# Patient Record
Sex: Female | Born: 1945 | Race: White | Hispanic: No | Smoking: Never smoker
Health system: Southern US, Community
[De-identification: ages and names within clinical notes are randomized; demographics above are authoritative.]

## PROBLEM LIST (undated history)

## (undated) DIAGNOSIS — M797 Fibromyalgia: Secondary | ICD-10-CM

## (undated) DIAGNOSIS — F419 Anxiety disorder, unspecified: Secondary | ICD-10-CM

## (undated) DIAGNOSIS — I639 Cerebral infarction, unspecified: Secondary | ICD-10-CM

## (undated) DIAGNOSIS — K219 Gastro-esophageal reflux disease without esophagitis: Secondary | ICD-10-CM

## (undated) DIAGNOSIS — G629 Polyneuropathy, unspecified: Secondary | ICD-10-CM

## (undated) DIAGNOSIS — I10 Essential (primary) hypertension: Secondary | ICD-10-CM

## (undated) DIAGNOSIS — M069 Rheumatoid arthritis, unspecified: Secondary | ICD-10-CM

## (undated) DIAGNOSIS — F329 Major depressive disorder, single episode, unspecified: Secondary | ICD-10-CM

## (undated) DIAGNOSIS — F32A Depression, unspecified: Secondary | ICD-10-CM

## (undated) HISTORY — PX: ABDOMINAL HYSTERECTOMY: SHX81

## (undated) HISTORY — PX: KNEE SURGERY: SHX244

## (undated) HISTORY — PX: CHOLECYSTECTOMY: SHX55

## (undated) HISTORY — PX: ANKLE RECONSTRUCTION: SHX1151

## (undated) HISTORY — PX: APPENDECTOMY: SHX54

## (undated) HISTORY — PX: BACK SURGERY: SHX140

---

## 1998-01-09 ENCOUNTER — Other Ambulatory Visit: Admission: RE | Admit: 1998-01-09 | Discharge: 1998-01-09 | Payer: Self-pay | Admitting: Obstetrics and Gynecology

## 1998-08-30 ENCOUNTER — Inpatient Hospital Stay (HOSPITAL_COMMUNITY): Admission: EM | Admit: 1998-08-30 | Discharge: 1998-09-02 | Payer: Self-pay | Admitting: Orthopedic Surgery

## 1999-04-04 ENCOUNTER — Encounter: Payer: Self-pay | Admitting: Physical Medicine and Rehabilitation

## 1999-04-04 ENCOUNTER — Encounter
Admission: RE | Admit: 1999-04-04 | Discharge: 1999-04-04 | Payer: Self-pay | Admitting: Physical Medicine and Rehabilitation

## 1999-12-18 ENCOUNTER — Other Ambulatory Visit: Admission: RE | Admit: 1999-12-18 | Discharge: 1999-12-18 | Payer: Self-pay | Admitting: Obstetrics and Gynecology

## 2000-01-10 ENCOUNTER — Encounter: Payer: Self-pay | Admitting: Neurological Surgery

## 2000-01-10 ENCOUNTER — Encounter: Admission: RE | Admit: 2000-01-10 | Discharge: 2000-01-10 | Payer: Self-pay | Admitting: Neurological Surgery

## 2000-04-02 ENCOUNTER — Encounter: Payer: Self-pay | Admitting: Neurological Surgery

## 2000-04-06 ENCOUNTER — Encounter: Payer: Self-pay | Admitting: Neurological Surgery

## 2000-04-06 ENCOUNTER — Ambulatory Visit (HOSPITAL_COMMUNITY): Admission: RE | Admit: 2000-04-06 | Discharge: 2000-04-08 | Payer: Self-pay | Admitting: Neurological Surgery

## 2000-12-02 ENCOUNTER — Encounter: Admission: RE | Admit: 2000-12-02 | Discharge: 2000-12-02 | Payer: Self-pay | Admitting: Neurological Surgery

## 2000-12-02 ENCOUNTER — Encounter: Payer: Self-pay | Admitting: Neurological Surgery

## 2001-03-02 ENCOUNTER — Encounter (HOSPITAL_COMMUNITY): Admission: RE | Admit: 2001-03-02 | Discharge: 2001-04-01 | Payer: Self-pay | Admitting: Neurological Surgery

## 2001-04-07 ENCOUNTER — Encounter (HOSPITAL_COMMUNITY): Admission: RE | Admit: 2001-04-07 | Discharge: 2001-05-07 | Payer: Self-pay | Admitting: Neurological Surgery

## 2001-05-18 ENCOUNTER — Other Ambulatory Visit: Admission: RE | Admit: 2001-05-18 | Discharge: 2001-05-18 | Payer: Self-pay | Admitting: Otolaryngology

## 2002-11-29 ENCOUNTER — Encounter: Payer: Self-pay | Admitting: Neurological Surgery

## 2002-12-01 ENCOUNTER — Observation Stay (HOSPITAL_COMMUNITY): Admission: RE | Admit: 2002-12-01 | Discharge: 2002-12-03 | Payer: Self-pay | Admitting: Neurological Surgery

## 2002-12-01 ENCOUNTER — Encounter: Payer: Self-pay | Admitting: Neurological Surgery

## 2003-02-21 ENCOUNTER — Inpatient Hospital Stay (HOSPITAL_COMMUNITY): Admission: RE | Admit: 2003-02-21 | Discharge: 2003-02-23 | Payer: Self-pay | Admitting: Neurological Surgery

## 2003-02-21 ENCOUNTER — Encounter: Payer: Self-pay | Admitting: Neurological Surgery

## 2003-06-13 ENCOUNTER — Ambulatory Visit (HOSPITAL_COMMUNITY): Admission: RE | Admit: 2003-06-13 | Discharge: 2003-06-13 | Payer: Self-pay | Admitting: Family Medicine

## 2003-07-19 ENCOUNTER — Ambulatory Visit (HOSPITAL_COMMUNITY): Admission: RE | Admit: 2003-07-19 | Discharge: 2003-07-19 | Payer: Self-pay | Admitting: Neurological Surgery

## 2004-03-19 ENCOUNTER — Other Ambulatory Visit: Admission: RE | Admit: 2004-03-19 | Discharge: 2004-03-19 | Payer: Self-pay | Admitting: Obstetrics and Gynecology

## 2004-08-14 ENCOUNTER — Ambulatory Visit (HOSPITAL_COMMUNITY): Admission: RE | Admit: 2004-08-14 | Discharge: 2004-08-14 | Payer: Self-pay | Admitting: Neurological Surgery

## 2004-10-29 ENCOUNTER — Encounter: Admission: RE | Admit: 2004-10-29 | Discharge: 2004-10-29 | Payer: Self-pay | Admitting: Rheumatology

## 2005-01-14 ENCOUNTER — Encounter: Admission: RE | Admit: 2005-01-14 | Discharge: 2005-01-14 | Payer: Self-pay | Admitting: Rheumatology

## 2005-05-13 ENCOUNTER — Encounter: Admission: RE | Admit: 2005-05-13 | Discharge: 2005-05-13 | Payer: Self-pay | Admitting: Neurological Surgery

## 2005-08-05 ENCOUNTER — Encounter: Admission: RE | Admit: 2005-08-05 | Discharge: 2005-08-05 | Payer: Self-pay | Admitting: Orthopedic Surgery

## 2005-11-18 ENCOUNTER — Encounter: Payer: Self-pay | Admitting: Vascular Surgery

## 2005-11-18 ENCOUNTER — Ambulatory Visit (HOSPITAL_COMMUNITY): Admission: RE | Admit: 2005-11-18 | Discharge: 2005-11-18 | Payer: Self-pay | Admitting: Orthopedic Surgery

## 2006-01-27 ENCOUNTER — Encounter: Admission: RE | Admit: 2006-01-27 | Discharge: 2006-01-27 | Payer: Self-pay | Admitting: Orthopedic Surgery

## 2006-06-19 ENCOUNTER — Encounter: Admission: RE | Admit: 2006-06-19 | Discharge: 2006-06-19 | Payer: Self-pay | Admitting: Neurological Surgery

## 2006-08-22 ENCOUNTER — Encounter: Admission: RE | Admit: 2006-08-22 | Discharge: 2006-08-22 | Payer: Self-pay | Admitting: Orthopedic Surgery

## 2006-11-16 ENCOUNTER — Ambulatory Visit (HOSPITAL_COMMUNITY): Admission: RE | Admit: 2006-11-16 | Discharge: 2006-11-16 | Payer: Self-pay | Admitting: Family Medicine

## 2007-01-19 ENCOUNTER — Ambulatory Visit (HOSPITAL_COMMUNITY): Admission: RE | Admit: 2007-01-19 | Discharge: 2007-01-19 | Payer: Self-pay | Admitting: Family Medicine

## 2007-04-04 ENCOUNTER — Encounter: Admission: RE | Admit: 2007-04-04 | Discharge: 2007-04-04 | Payer: Self-pay | Admitting: Neurological Surgery

## 2007-07-20 ENCOUNTER — Encounter: Admission: RE | Admit: 2007-07-20 | Discharge: 2007-07-20 | Payer: Self-pay | Admitting: Orthopedic Surgery

## 2007-09-23 ENCOUNTER — Ambulatory Visit (HOSPITAL_COMMUNITY): Admission: RE | Admit: 2007-09-23 | Discharge: 2007-09-23 | Payer: Self-pay | Admitting: Family Medicine

## 2007-11-09 ENCOUNTER — Ambulatory Visit (HOSPITAL_COMMUNITY): Admission: RE | Admit: 2007-11-09 | Discharge: 2007-11-09 | Payer: Self-pay | Admitting: Family Medicine

## 2007-11-29 ENCOUNTER — Encounter: Admission: RE | Admit: 2007-11-29 | Discharge: 2007-11-29 | Payer: Self-pay | Admitting: Orthopedic Surgery

## 2007-12-30 ENCOUNTER — Encounter: Admission: RE | Admit: 2007-12-30 | Discharge: 2007-12-30 | Payer: Self-pay | Admitting: Orthopedic Surgery

## 2008-02-07 ENCOUNTER — Encounter: Admission: RE | Admit: 2008-02-07 | Discharge: 2008-02-07 | Payer: Self-pay | Admitting: Orthopedic Surgery

## 2008-02-24 ENCOUNTER — Encounter: Admission: RE | Admit: 2008-02-24 | Discharge: 2008-02-24 | Payer: Self-pay | Admitting: Orthopedic Surgery

## 2008-05-25 ENCOUNTER — Encounter: Admission: RE | Admit: 2008-05-25 | Discharge: 2008-05-25 | Payer: Self-pay | Admitting: Rheumatology

## 2008-12-02 ENCOUNTER — Encounter: Admission: RE | Admit: 2008-12-02 | Discharge: 2008-12-02 | Payer: Self-pay | Admitting: Neurological Surgery

## 2009-01-06 ENCOUNTER — Encounter: Admission: RE | Admit: 2009-01-06 | Discharge: 2009-01-06 | Payer: Self-pay | Admitting: Orthopedic Surgery

## 2009-06-15 ENCOUNTER — Encounter: Admission: RE | Admit: 2009-06-15 | Discharge: 2009-06-15 | Payer: Self-pay | Admitting: Neurological Surgery

## 2009-07-08 ENCOUNTER — Encounter: Admission: RE | Admit: 2009-07-08 | Discharge: 2009-07-08 | Payer: Self-pay | Admitting: Orthopedic Surgery

## 2010-06-29 ENCOUNTER — Encounter: Payer: Self-pay | Admitting: Neurological Surgery

## 2010-06-30 ENCOUNTER — Encounter: Payer: Self-pay | Admitting: Family Medicine

## 2010-06-30 ENCOUNTER — Encounter: Payer: Self-pay | Admitting: Neurological Surgery

## 2010-06-30 ENCOUNTER — Encounter: Payer: Self-pay | Admitting: Rheumatology

## 2010-08-27 ENCOUNTER — Other Ambulatory Visit: Payer: Self-pay | Admitting: Rheumatology

## 2010-08-27 ENCOUNTER — Ambulatory Visit
Admission: RE | Admit: 2010-08-27 | Discharge: 2010-08-27 | Disposition: A | Discharge status: Home / Self Care | Payer: Medicare Other | Source: Ambulatory Visit | Attending: Rheumatology | Admitting: Rheumatology

## 2010-08-27 DIAGNOSIS — M1990 Unspecified osteoarthritis, unspecified site: Secondary | ICD-10-CM

## 2010-10-25 NOTE — Op Note (Signed)
[place]  Patient:    Robin Blackburn, Robin Blackburn                        MRN: [id number] Proc. Date: 04/06/00 Adm. Date:  [id number] Attending:  Jonne Ply                           Operative Report  PREOPERATIVE DIAGNOSIS:  Spondylosis L4-5, with bilateral L5 radiculopathy.  POSTOPERATIVE DIAGNOSIS:  Spondylosis L4-5, with bilateral L5 radiculopathy.  OPERATION:  L4 and L5 laminotomy and foraminotomy, with micro endoscopic dissection for decompression of the L5 nerve root -- using the Met-Rx system and operating microscope with microdissection technique.  SURGEON:  Stefani Dama, M.D.  FIRST ASSISTANT:  Tanya Nones. Jeral Fruit, M.D.  ANESTHESIA:  General endotracheal.  INDICATIONS:  The patient is a 65 year old individual who has had significant back and bilateral lumbar radiculopathy (worse on the left than on the right).  She was found to have severe spondylosis at the L4-5 level, with bilateral recess stenosis.  After careful consideration of her options, she was advised regarding surgical decompression and foraminotomy.  She is now taken to the operating room for these procedures.  DESCRIPTION OF PROCEDURE:  The patient was brought to the operating room supine on the stretcher.  After the smooth induction of general endotracheal anesthesia, she was turned prone.  The back was shaved and prepped with Duraprep and draped in a sterile fashion.  After localizing the area of the back using the fluoroscope in the AP direction, the lateral position was checked in an area of skin measuring 3.0 inches in length with a total of 2.5 cc of lidocaine with epinephrine.  A midline incision was then created over the chosen area at the L4-5 interspace.  Subcutaneous tissue was dissected with finger dissection, and a paramedian approach was chosen to place a K-wire at the L4 lamina.  The interlaminar space was then opened using a wanding technique, using a  series of dilators up to the 18 mm space.  Then the 18 mm x 5 cm trocar was placed as an endoscope through the incision in the back, down to the interlaminar space at L4-5.  Dissection was then further completed using a combination of curets and a monopolar cautery.  The inferior margin of the lamina at L4 was resected and a laminotomy was created in this region.  The common dura tube was identified. The takeoff of the L5 nerve root was identified.  This area was then cleared out gradually by using a combination of Midas Rex with the A2 dissector, and the 2 and 3 mm Kerrison punch.  Under microscopic magnification with microdissection technique, we decompressed the common dura to L5 nerve root.  The disk space was examined. This was noted to have a small ventral lump but was very solid, no free disk material was noted and the longitudinal ligament was noted to be solid.  A Fenmore procedure was then completed on the contralateral side using the same fluoroscopic approach through the same central incision.  Once the decompression was completed in a similar fashion and care was taken to assure that the nerve root was free and clear, hemostasis from the soft tissues was obtained.   The endoscope was removed.  The microscope was removed from the field.  Vicryl 3-0 suture was used in the subcuticular tissues, after closing the fascia with 2-0 Vicryl  in interrupted fashion.  The patient tolerated the procedure well and went to recovery room in stable condition. DD:  04/06/00 TD:  04/07/00 Job: [id number]

## 2010-10-25 NOTE — Op Note (Signed)
NAME:  Robin Blackburn, Robin Blackburn                           ACCOUNT NO.:  [id number]   MEDICAL RECORD NO.:  [id number]                   PATIENT TYPE:  INP   LOCATION:  3172                                 FACILITY:  [facility]   PHYSICIAN:  Stefani Dama, M.D.               DATE OF BIRTH:  [birth year]   DATE OF PROCEDURE:  02/21/2003  DATE OF DISCHARGE:                                 OPERATIVE REPORT   PREOPERATIVE DIAGNOSIS:  Recurrent herniated nucleus pulposus L5-S1 left.   POSTOPERATIVE DIAGNOSIS:  Recurrent herniated nucleus pulposus L5-S1 left.   OPERATION:  L5-S1 laminotomy and microdiskectomy with operating microscope  and microdissection technique L5-S1 left.   SURGEON:  Stefani Dama, M.D.   ANESTHESIA:  General endotracheal.   INDICATIONS FOR PROCEDURE:  The patient is a 66 year old individual who has  had significant back and left lower extremity pain.  He has actually had  bilateral lower extremity pain and underwent a lumbar MedTREK diskectomy at  L5-S1 on the left side in June 2004.  She was not getting better from the  immediate time after the surgery, and had persistent complaints of  significant back pain, bilateral leg pain and numbness.  A recent MRI  demonstrated that the patient has evidence of significant spondylotic  disease in the lower lumbar spine and a recurrent disk herniation at L50-S1  on the left side.  She is taken back to the operating room now to undergo a  surgical exploration and extirpation of the disk via a microscopic  diskectomy.   DESCRIPTION OF PROCEDURE:  The patient was brought to the operating room  supine on the stretcher.  After a smooth induction of general endotracheal  anesthesia, she was turned prone and the back was shaved and prepped with  DuraPrep and draped in a sterile fashion.  The previously-made incision was  opened and enlarged inferiorly.  The dissection was carried down to the  lumbodorsal fascia which was opened on the left  side.  Then using a  subperiosteal dissection and a Cobb elevator, the L5-S1 space was uncovered.  The fascial and scar tissue had grown up in this region.  It was then  trimmed down.  The intralaminar space was identified.  The small laminotomy  was enlarged significantly to remove the most of the entire laminar arch of  L5 on the left side, to the mesial wall of the facet.  Scar was then divided  and the common dural tube was identified and carefully dissected.  The S1  nerve root was noted to be involved in some scar tissue and this was gently  dissected.  Underneath this was noted to be a significant mass and there was  some free fragments of disk material in this area.  These were incised and  then removed.  Then using a combination of microdissectors and the operating  microscope for better visualization, the  dissection along the S1 nerve root  and the common dural tube was undertaken to remove all the scar tissue and  reflect the common dural tube and the S1 nerve root medially.  When this was  accomplished, then the disk space was incised further, and significant other  quantities of disk material were removed from the subligamentous space and  in the free just medial to the S1 nerve root.  This allowed for a good  decompression of the common dural tube and the S1 nerve root.  This was  accomplished very thoroughly both medially to the disk and inferiorly along  the superior edge of the S1 vertebra.  When the entire area was decompressed  well, hemostasis in the epidural spaces was checked cautiously.  The S1  nerve root was able to be sounded down the foramen.  The common dural tube  was decompressed medially, and then 25 mcg of fentanyl was left in the  epidural space, and the retractors were removed.  The microscope was  removed.  The lumbodorsal fascia was closed with #1 Vicryl in an  interrupted fashion, and #2-0 Vicryl was used in the subcutaneous and  subcuticular tissues,  and #3-0 Vicryl subcuticularly.  DermaBond was placed  in the skin.  The patient tolerated the procedure well and was returned to the recovery  room in stable condition.                                                Stefani Dama, M.D.    [name]  D:  02/21/2003  T:  02/21/2003  Job:  [id number]

## 2010-10-25 NOTE — Discharge Summary (Signed)
   NAME:  Robin Blackburn, Robin Blackburn                           ACCOUNT NO.:  [id number]   MEDICAL RECORD NO.:  [id number]                   PATIENT TYPE:  INP   LOCATION:  3028                                 FACILITY:  [facility]   PHYSICIAN:  Stefani Dama, M.D.               DATE OF BIRTH:  1946/03/16   DATE OF ADMISSION:  02/21/2003  DATE OF DISCHARGE:  02/23/2003                                 DISCHARGE SUMMARY   CONDITION ON DISCHARGE:  Stable.   HOSPITAL COURSE:  Robin Blackburn is a 65 year old individual who has had  problems with a herniated nucleus pulposus at L5-S1 on the left side. She  had initial surgery in June but then soon complained of the same severe pain  in the buttock and left lower extremity and was found to have a large  recurrent disk herniation on a recent scan a few weeks ago. She was advised  regarding repeat surgery, and this was performed on September 14.  Postoperatively, she has had good initial relief from her left buttock and  leg pain. She has complained of nausea during the postoperative period. She  was treated with Zofran intravenously and Phenergan IV and orally.  Clinically, she is having some return of left leg pain but overall seems to  be functioning fairly well. She is discharged home with a prescription for  Darvocet as needed for pain, Phenergan for nausea, Robaxin as a muscle  relaxer, and Mobic as a nonsteroidal anti-inflammatory which the patient  notes she can take for brief periods of time before it tends to make her  stomach ill. She will be seen in the office in two weeks' time for further  followup.   CONDITION ON DISCHARGE:  Stable.   FINAL DIAGNOSIS:  Same.                                                Stefani Dama, M.D.    [name]  D:  02/23/2003  T:  02/25/2003  Job:  [id number]

## 2010-10-25 NOTE — Op Note (Signed)
NAME:  Robin Blackburn, Robin Blackburn                           ACCOUNT NO.:  [id number]   MEDICAL RECORD NO.:  [id number]                   PATIENT TYPE:  OIB   LOCATION:  NA                                   FACILITY:  [facility]   PHYSICIAN:  Stefani Dama, M.D.               DATE OF BIRTH:  [birth year]   DATE OF PROCEDURE:  12/01/2002  DATE OF DISCHARGE:                                 OPERATIVE REPORT   PREOPERATIVE DIAGNOSIS:  Herniated nucleus pulposus, L5-S1 right, with right  lumbar radiculopathy.   POSTOPERATIVE DIAGNOSIS:  Herniated nucleus pulposus, L5-S1 right, with  right lumbar radiculopathy.   PROCEDURE:  Right L5-S1 Met-Rx diskectomy with operating microscope and  microdissection technique.   SURGEON:  Stefani Dama, M.D.   FIRST ASSISTANT:  Coletta Memos, M.D.   ANESTHESIA:  General endotracheal.   INDICATIONS:  The patient is a 65 year old individual who has had  significant back and right lower extremity pain.  She has evidence of a  herniated nucleus pulposus on the left side at L5-S1.  Because of her left-  legged pain, she is being taken to the operating room.   PROCEDURE:  The patient was brought to the operating room supine on the  stretcher.  After the smooth induction of general endotracheal anesthesia,  she was turned prone and the back was shaved, prepped with Duraprep, and  draped in a sterile fashion.  Using fluoroscopic localization and a latex-  free technique, the patient had the L5-S1 area localized with a K-wire.  Then a wanding technique was used after a one-inch incision was created in  the lower lumbar spine to dissect away the tissues in the area of the  interlaminar space at L5-S1 on the left side.  Ultimately an 18 mm x 6 cm  long endoscopic cannula was fixed to the operating table with a clamp.  The  microscope was draped and brought into the field and then through this  aperture, a laminotomy was created removing the inferior margin of the  lamina  of L5 out to the mesial wall of the facet.  The yellow ligament was  taken up and the common dural tube was then identified.  With further  dissection the disk space was identified with a significant subligamentous  mass protruding into the S1 nerve root on that left side.  By dissecting  further and isolating the S1 nerve root and the common dural tube medially,  epidural veins were cauterized and divided laterally using a microdissection  technique.  The procedure was performed with Dr. Franky Macho helping to retract  the common dural tube and the nerve root to provide exposure and the mass  was incised, and underneath this mass were found to be several fragments of  disk material that was readily removed from the subligamentous space,  immediately allowing good decompression of the S1 nerve root.  The  diskectomy was then continued by entering the disk space and removing a  significant quantity of severely degenerated disk material from within the  disk space itself.  Once this was completed, the area was copiously  irrigated with antibiotic irrigating solution and hemostasis in the soft  tissues was obtained.  The space was repeatedly checked and no other  fragments of disk material could be removed from within it.  Then the  microscope  was withdrawn, the endoscopic cannula was withdrawn, and the fascia was  closed with 3-0 Vicryl in interrupted fashion, and 3-0 Vicryl was used to  close the subcuticular skin.  The patient tolerated the procedure well and  was returned to the recovery room in stable condition.                                               Stefani Dama, M.D.    [name]  D:  12/01/2002  T:  12/03/2002  Job:  [id number]

## 2010-10-28 ENCOUNTER — Other Ambulatory Visit: Payer: Self-pay | Admitting: Orthopedic Surgery

## 2010-10-28 ENCOUNTER — Ambulatory Visit
Admission: RE | Admit: 2010-10-28 | Discharge: 2010-10-28 | Disposition: A | Discharge status: Home / Self Care | Payer: Medicare Other | Source: Ambulatory Visit | Attending: Orthopedic Surgery | Admitting: Orthopedic Surgery

## 2010-10-28 DIAGNOSIS — T148XXA Other injury of unspecified body region, initial encounter: Secondary | ICD-10-CM

## 2010-12-23 ENCOUNTER — Other Ambulatory Visit: Payer: Self-pay | Admitting: Neurological Surgery

## 2010-12-23 DIAGNOSIS — M47816 Spondylosis without myelopathy or radiculopathy, lumbar region: Secondary | ICD-10-CM

## 2010-12-31 ENCOUNTER — Other Ambulatory Visit: Payer: Medicare Other

## 2011-01-15 ENCOUNTER — Other Ambulatory Visit: Payer: Medicare Other

## 2011-01-23 ENCOUNTER — Other Ambulatory Visit: Payer: Medicare Other

## 2011-01-28 ENCOUNTER — Ambulatory Visit
Admission: RE | Admit: 2011-01-28 | Discharge: 2011-01-28 | Disposition: A | Discharge status: Home / Self Care | Payer: Medicare Other | Source: Ambulatory Visit | Attending: Neurological Surgery | Admitting: Neurological Surgery

## 2011-01-28 DIAGNOSIS — M47816 Spondylosis without myelopathy or radiculopathy, lumbar region: Secondary | ICD-10-CM

## 2011-02-12 ENCOUNTER — Ambulatory Visit
Admission: RE | Admit: 2011-02-12 | Discharge: 2011-02-12 | Disposition: A | Discharge status: Home / Self Care | Payer: Medicare Other | Source: Ambulatory Visit | Attending: Neurological Surgery | Admitting: Neurological Surgery

## 2011-02-12 MED ORDER — GADOBENATE DIMEGLUMINE 529 MG/ML IV SOLN
16.0000 mL | Freq: Once | INTRAVENOUS | Status: AC | PRN
  Administered 2011-02-12: 16 mL via INTRAVENOUS

## 2011-08-29 ENCOUNTER — Other Ambulatory Visit (HOSPITAL_COMMUNITY): Payer: Self-pay | Admitting: Rheumatology

## 2011-09-01 ENCOUNTER — Other Ambulatory Visit (HOSPITAL_COMMUNITY): Payer: Medicare Other

## 2011-09-17 ENCOUNTER — Ambulatory Visit (HOSPITAL_COMMUNITY)
Admission: RE | Admit: 2011-09-17 | Discharge: 2011-09-17 | Disposition: A | Discharge status: Home / Self Care | Payer: Medicare Other | Source: Ambulatory Visit | Attending: Rheumatology | Admitting: Rheumatology

## 2011-09-17 DIAGNOSIS — M899 Disorder of bone, unspecified: Secondary | ICD-10-CM | POA: Insufficient documentation

## 2011-09-17 DIAGNOSIS — M949 Disorder of cartilage, unspecified: Secondary | ICD-10-CM | POA: Insufficient documentation

## 2011-09-17 DIAGNOSIS — Z780 Asymptomatic menopausal state: Secondary | ICD-10-CM | POA: Insufficient documentation

## 2011-09-17 DIAGNOSIS — E559 Vitamin D deficiency, unspecified: Secondary | ICD-10-CM | POA: Insufficient documentation

## 2012-01-20 ENCOUNTER — Other Ambulatory Visit (HOSPITAL_COMMUNITY): Payer: Self-pay | Admitting: Internal Medicine

## 2012-01-20 DIAGNOSIS — Z0000 Encounter for general adult medical examination without abnormal findings: Secondary | ICD-10-CM

## 2012-01-20 DIAGNOSIS — Z139 Encounter for screening, unspecified: Secondary | ICD-10-CM

## 2012-01-26 ENCOUNTER — Ambulatory Visit (HOSPITAL_COMMUNITY): Payer: Medicare Other

## 2012-10-19 ENCOUNTER — Telehealth: Payer: Self-pay

## 2012-10-19 NOTE — Telephone Encounter (Signed)
Pt was referred by Dr. Sherwood Gambler for screening colonoscopy. She called to say she had asked them to send referral to Dr. Karilyn Cota. She is not having problems at this time, and has recently lost her brother and has some physical problems going on also. She is aware that Dr. Karilyn Cota is not at this office now. I told her that I will let [facility] know and they can send referral to [facility] when she is ready to have colonoscopy.

## 2012-10-26 ENCOUNTER — Encounter (INDEPENDENT_AMBULATORY_CARE_PROVIDER_SITE_OTHER): Payer: Self-pay | Admitting: *Deleted

## 2013-02-02 ENCOUNTER — Encounter (INDEPENDENT_AMBULATORY_CARE_PROVIDER_SITE_OTHER): Payer: Self-pay | Admitting: *Deleted

## 2013-02-28 ENCOUNTER — Ambulatory Visit (INDEPENDENT_AMBULATORY_CARE_PROVIDER_SITE_OTHER): Payer: Medicare Other | Admitting: Internal Medicine

## 2013-03-07 ENCOUNTER — Ambulatory Visit (INDEPENDENT_AMBULATORY_CARE_PROVIDER_SITE_OTHER): Payer: Medicare Other | Admitting: Internal Medicine

## 2014-02-17 DIAGNOSIS — I639 Cerebral infarction, unspecified: Secondary | ICD-10-CM

## 2014-02-17 HISTORY — DX: Cerebral infarction, unspecified: I63.9

## 2014-02-19 ENCOUNTER — Emergency Department (HOSPITAL_COMMUNITY): Payer: Medicare Other

## 2014-02-19 ENCOUNTER — Encounter (HOSPITAL_COMMUNITY): Payer: Self-pay | Admitting: Emergency Medicine

## 2014-02-19 ENCOUNTER — Inpatient Hospital Stay (HOSPITAL_COMMUNITY)
Admission: EM | Admit: 2014-02-19 | Discharge: 2014-02-22 | DRG: 065 | Disposition: A | Discharge status: Skilled Nursing Facility | Payer: Medicare Other | Attending: Internal Medicine | Admitting: Internal Medicine

## 2014-02-19 DIAGNOSIS — T502X5A Adverse effect of carbonic-anhydrase inhibitors, benzothiadiazides and other diuretics, initial encounter: Secondary | ICD-10-CM | POA: Diagnosis present

## 2014-02-19 DIAGNOSIS — W19XXXA Unspecified fall, initial encounter: Secondary | ICD-10-CM | POA: Diagnosis present

## 2014-02-19 DIAGNOSIS — Z806 Family history of leukemia: Secondary | ICD-10-CM

## 2014-02-19 DIAGNOSIS — E876 Hypokalemia: Secondary | ICD-10-CM | POA: Diagnosis present

## 2014-02-19 DIAGNOSIS — M069 Rheumatoid arthritis, unspecified: Secondary | ICD-10-CM | POA: Diagnosis present

## 2014-02-19 DIAGNOSIS — I6359 Cerebral infarction due to unspecified occlusion or stenosis of other cerebral artery: Secondary | ICD-10-CM

## 2014-02-19 DIAGNOSIS — F411 Generalized anxiety disorder: Secondary | ICD-10-CM | POA: Diagnosis present

## 2014-02-19 DIAGNOSIS — I6309 Cerebral infarction due to thrombosis of other precerebral artery: Secondary | ICD-10-CM

## 2014-02-19 DIAGNOSIS — I639 Cerebral infarction, unspecified: Secondary | ICD-10-CM | POA: Diagnosis present

## 2014-02-19 DIAGNOSIS — I10 Essential (primary) hypertension: Secondary | ICD-10-CM | POA: Diagnosis present

## 2014-02-19 DIAGNOSIS — G629 Polyneuropathy, unspecified: Secondary | ICD-10-CM | POA: Diagnosis present

## 2014-02-19 DIAGNOSIS — S9030XA Contusion of unspecified foot, initial encounter: Secondary | ICD-10-CM

## 2014-02-19 DIAGNOSIS — Z8249 Family history of ischemic heart disease and other diseases of the circulatory system: Secondary | ICD-10-CM

## 2014-02-19 DIAGNOSIS — IMO0001 Reserved for inherently not codable concepts without codable children: Secondary | ICD-10-CM | POA: Diagnosis present

## 2014-02-19 DIAGNOSIS — Z79899 Other long term (current) drug therapy: Secondary | ICD-10-CM

## 2014-02-19 DIAGNOSIS — S93409A Sprain of unspecified ligament of unspecified ankle, initial encounter: Secondary | ICD-10-CM | POA: Diagnosis present

## 2014-02-19 DIAGNOSIS — S9031XA Contusion of right foot, initial encounter: Secondary | ICD-10-CM | POA: Diagnosis present

## 2014-02-19 DIAGNOSIS — R5381 Other malaise: Secondary | ICD-10-CM | POA: Diagnosis not present

## 2014-02-19 DIAGNOSIS — K219 Gastro-esophageal reflux disease without esophagitis: Secondary | ICD-10-CM | POA: Diagnosis present

## 2014-02-19 DIAGNOSIS — I6350 Cerebral infarction due to unspecified occlusion or stenosis of unspecified cerebral artery: Principal | ICD-10-CM | POA: Diagnosis present

## 2014-02-19 DIAGNOSIS — Z823 Family history of stroke: Secondary | ICD-10-CM

## 2014-02-19 DIAGNOSIS — G589 Mononeuropathy, unspecified: Secondary | ICD-10-CM

## 2014-02-19 DIAGNOSIS — F419 Anxiety disorder, unspecified: Secondary | ICD-10-CM | POA: Diagnosis present

## 2014-02-19 DIAGNOSIS — M545 Low back pain, unspecified: Secondary | ICD-10-CM | POA: Diagnosis present

## 2014-02-19 DIAGNOSIS — D72829 Elevated white blood cell count, unspecified: Secondary | ICD-10-CM | POA: Diagnosis present

## 2014-02-19 DIAGNOSIS — S93401A Sprain of unspecified ligament of right ankle, initial encounter: Secondary | ICD-10-CM | POA: Diagnosis present

## 2014-02-19 DIAGNOSIS — G894 Chronic pain syndrome: Secondary | ICD-10-CM | POA: Diagnosis present

## 2014-02-19 DIAGNOSIS — I6329 Cerebral infarction due to unspecified occlusion or stenosis of other precerebral arteries: Secondary | ICD-10-CM

## 2014-02-19 DIAGNOSIS — E871 Hypo-osmolality and hyponatremia: Secondary | ICD-10-CM | POA: Diagnosis present

## 2014-02-19 HISTORY — DX: Anxiety disorder, unspecified: F41.9

## 2014-02-19 HISTORY — DX: Polyneuropathy, unspecified: G62.9

## 2014-02-19 HISTORY — DX: Fibromyalgia: M79.7

## 2014-02-19 HISTORY — DX: Cerebral infarction, unspecified: I63.9

## 2014-02-19 HISTORY — DX: Essential (primary) hypertension: I10

## 2014-02-19 HISTORY — DX: Gastro-esophageal reflux disease without esophagitis: K21.9

## 2014-02-19 HISTORY — DX: Depression, unspecified: F32.A

## 2014-02-19 HISTORY — DX: Major depressive disorder, single episode, unspecified: F32.9

## 2014-02-19 HISTORY — DX: Rheumatoid arthritis, unspecified: M06.9

## 2014-02-19 LAB — COMPREHENSIVE METABOLIC PANEL
ALK PHOS: 84 U/L (ref 39–117)
ALT: 15 U/L (ref 0–35)
AST: 16 U/L (ref 0–37)
Albumin: 3.8 g/dL (ref 3.5–5.2)
Anion gap: 18 — ABNORMAL HIGH (ref 5–15)
BILIRUBIN TOTAL: 0.6 mg/dL (ref 0.3–1.2)
BUN: 5 mg/dL — ABNORMAL LOW (ref 6–23)
CHLORIDE: 91 meq/L — AB (ref 96–112)
CO2: 23 meq/L (ref 19–32)
Calcium: 9.1 mg/dL (ref 8.4–10.5)
Creatinine, Ser: 0.51 mg/dL (ref 0.50–1.10)
GFR calc Af Amer: >90 mL/min (ref >90)
Glucose, Bld: 124 mg/dL — ABNORMAL HIGH (ref 70–99)
POTASSIUM: 3.1 meq/L — AB (ref 3.7–5.3)
SODIUM: 132 meq/L — AB (ref 137–147)
Total Protein: 7.6 g/dL (ref 6.0–8.3)

## 2014-02-19 LAB — DIFFERENTIAL
BASOS ABS: 0 10*3/uL (ref 0.0–0.1)
BASOS PCT: 0 % (ref 0–1)
Eosinophils Absolute: 0 10*3/uL (ref 0.0–0.7)
Eosinophils Relative: 0 % (ref 0–5)
LYMPHS PCT: 10 % — AB (ref 12–46)
Lymphs Abs: 2.3 10*3/uL (ref 0.7–4.0)
Monocytes Absolute: 2 10*3/uL — ABNORMAL HIGH (ref 0.1–1.0)
Monocytes Relative: 9 % (ref 3–12)
NEUTROS ABS: 18.7 10*3/uL — AB (ref 1.7–7.7)
NEUTROS PCT: 81 % — AB (ref 43–77)

## 2014-02-19 LAB — URINE MICROSCOPIC-ADD ON

## 2014-02-19 LAB — URINALYSIS, ROUTINE W REFLEX MICROSCOPIC
Bilirubin Urine: NEGATIVE
Glucose, UA: NEGATIVE mg/dL
NITRITE: NEGATIVE
Protein, ur: NEGATIVE mg/dL
SPECIFIC GRAVITY, URINE: 1.01 (ref 1.005–1.030)
UROBILINOGEN UA: 0.2 mg/dL (ref 0.0–1.0)
pH: 6 (ref 5.0–8.0)

## 2014-02-19 LAB — RAPID URINE DRUG SCREEN, HOSP PERFORMED
AMPHETAMINES: NOT DETECTED
BARBITURATES: NOT DETECTED
Benzodiazepines: POSITIVE — AB
Cocaine: NOT DETECTED
OPIATES: POSITIVE — AB
TETRAHYDROCANNABINOL: NOT DETECTED

## 2014-02-19 LAB — CBC
HCT: 41.4 % (ref 36.0–46.0)
Hemoglobin: 14.5 g/dL (ref 12.0–15.0)
MCH: 32.7 pg (ref 26.0–34.0)
MCHC: 35 g/dL (ref 30.0–36.0)
MCV: 93.5 fL (ref 78.0–100.0)
PLATELETS: 481 10*3/uL — AB (ref 150–400)
RBC: 4.43 MIL/uL (ref 3.87–5.11)
RDW: 13.2 % (ref 11.5–15.5)
WBC: 23 10*3/uL — ABNORMAL HIGH (ref 4.0–10.5)

## 2014-02-19 LAB — I-STAT CHEM 8, ED
BUN: <3 mg/dL — ABNORMAL LOW (ref 6–23)
CHLORIDE: 96 meq/L (ref 96–112)
Calcium, Ion: 1.07 mmol/L — ABNORMAL LOW (ref 1.13–1.30)
Creatinine, Ser: 0.5 mg/dL (ref 0.50–1.10)
GLUCOSE: 128 mg/dL — AB (ref 70–99)
HEMATOCRIT: 47 % — AB (ref 36.0–46.0)
HEMOGLOBIN: 16 g/dL — AB (ref 12.0–15.0)
POTASSIUM: 3 meq/L — AB (ref 3.7–5.3)
Sodium: 131 mEq/L — ABNORMAL LOW (ref 137–147)
TCO2: 26 mmol/L (ref 0–100)

## 2014-02-19 LAB — APTT: APTT: 32 s (ref 24–37)

## 2014-02-19 LAB — ETHANOL: Alcohol, Ethyl (B): <11 mg/dL (ref 0–11)

## 2014-02-19 LAB — MAGNESIUM: MAGNESIUM: 1.8 mg/dL (ref 1.5–2.5)

## 2014-02-19 LAB — PROTIME-INR
INR: 0.97 (ref 0.00–1.49)
Prothrombin Time: 12.9 seconds (ref 11.6–15.2)

## 2014-02-19 MED ORDER — LORAZEPAM 2 MG/ML IJ SOLN
0.5000 mg | Freq: Once | INTRAMUSCULAR | Status: AC
  Administered 2014-02-19: 0.5 mg via INTRAVENOUS
  Filled 2014-02-19: qty 1

## 2014-02-19 MED ORDER — DIPHENHYDRAMINE HCL 50 MG/ML IJ SOLN
25.0000 mg | Freq: Four times a day (QID) | INTRAMUSCULAR | Status: DC | PRN
  Administered 2014-02-20 – 2014-02-21 (×4): 25 mg via INTRAVENOUS
  Filled 2014-02-19 (×4): qty 1

## 2014-02-19 MED ORDER — SODIUM CHLORIDE 0.9 % IV SOLN
INTRAVENOUS | Status: DC
  Administered 2014-02-19 – 2014-02-21 (×4): via INTRAVENOUS

## 2014-02-19 MED ORDER — ENOXAPARIN SODIUM 30 MG/0.3ML ~~LOC~~ SOLN
30.0000 mg | SUBCUTANEOUS | Status: DC
  Administered 2014-02-19: 30 mg via SUBCUTANEOUS
  Filled 2014-02-19: qty 0.3

## 2014-02-19 MED ORDER — ONDANSETRON HCL 4 MG/2ML IJ SOLN
4.0000 mg | Freq: Once | INTRAMUSCULAR | Status: AC
  Administered 2014-02-19: 4 mg via INTRAVENOUS
  Filled 2014-02-19: qty 2

## 2014-02-19 MED ORDER — ASPIRIN 325 MG PO TABS
325.0000 mg | ORAL_TABLET | Freq: Every day | ORAL | Status: DC
  Administered 2014-02-22: 325 mg via ORAL
  Filled 2014-02-19 (×2): qty 1

## 2014-02-19 MED ORDER — LORAZEPAM 2 MG/ML IJ SOLN
0.5000 mg | INTRAMUSCULAR | Status: DC | PRN
  Administered 2014-02-19 – 2014-02-21 (×3): 0.5 mg via INTRAVENOUS
  Filled 2014-02-19 (×3): qty 1

## 2014-02-19 MED ORDER — ONDANSETRON HCL 4 MG/2ML IJ SOLN
4.0000 mg | Freq: Four times a day (QID) | INTRAMUSCULAR | Status: DC | PRN
  Administered 2014-02-19 – 2014-02-22 (×8): 4 mg via INTRAVENOUS
  Filled 2014-02-19 (×8): qty 2

## 2014-02-19 MED ORDER — PANTOPRAZOLE SODIUM 40 MG IV SOLR
40.0000 mg | INTRAVENOUS | Status: DC
  Administered 2014-02-19: 40 mg via INTRAVENOUS
  Filled 2014-02-19: qty 40

## 2014-02-19 MED ORDER — HYDROMORPHONE HCL PF 1 MG/ML IJ SOLN: 0.5000 mg | INTRAMUSCULAR | Status: DC | PRN

## 2014-02-19 MED ORDER — POTASSIUM CHLORIDE 10 MEQ/100ML IV SOLN
10.0000 meq | INTRAVENOUS | Status: AC
  Administered 2014-02-19 – 2014-02-20 (×4): 10 meq via INTRAVENOUS
  Filled 2014-02-19: qty 100

## 2014-02-19 MED ORDER — ASPIRIN 300 MG RE SUPP
300.0000 mg | Freq: Every day | RECTAL | Status: DC
  Administered 2014-02-20 – 2014-02-21 (×2): 300 mg via RECTAL
  Filled 2014-02-19 (×8): qty 1

## 2014-02-19 MED ORDER — ASPIRIN 325 MG PO TABS: 325.0000 mg | ORAL_TABLET | Freq: Once | ORAL | Status: DC

## 2014-02-19 MED ORDER — STROKE: EARLY STAGES OF RECOVERY BOOK
Freq: Once | Status: AC
  Administered 2014-02-20: 18:00:00
  Filled 2014-02-19: qty 1

## 2014-02-19 MED ORDER — ASPIRIN 300 MG RE SUPP
300.0000 mg | Freq: Once | RECTAL | Status: AC
  Administered 2014-02-19: 300 mg via RECTAL
  Filled 2014-02-19: qty 1

## 2014-02-19 MED ORDER — LORAZEPAM 2 MG/ML IJ SOLN
1.0000 mg | Freq: Once | INTRAMUSCULAR | Status: AC
  Administered 2014-02-20: 1 mg via INTRAVENOUS
  Filled 2014-02-19: qty 1

## 2014-02-19 NOTE — ED Provider Notes (Signed)
CSN: [id number]     Arrival date & time 02/19/14  1639 History   First MD Initiated Contact with Patient 02/19/14 1643     Chief Complaint  Patient presents with  . Numbness    @EDPCLEARED @ (Consider location/radiation/quality/duration/timing/severity/associated sxs/prior Treatment) HPI Comments: Patient presents to the ER for evaluation of ankle injury. Patient reports that she twisted her ankle last night. She reports that she did not feel much pain because she has significant peripheral neuropathy. She knows that she twisted her foot under her and injured the foot and ankle area again this morning. Since then she has had progressive worsening of swelling and bruising of the foot and ankle.  Patient does report that she has noticed numbness, tingling of the right side of her face, right side of her tongue and right arm and leg. These symptoms began yesterday or possibly the night before. She reports some mild worsening today.   Past Medical History  Diagnosis Date  . Neuropathy   . Hypertension   . Anxiety   . Depression   . Acid reflux    Past Surgical History  Procedure Laterality Date  . Back surgery    . Knee surgery    . Ankle reconstruction    . Abdominal hysterectomy    . Appendectomy    . Cholecystectomy     No family history on file. History  Substance Use Topics  . Smoking status: Never Smoker   . Smokeless tobacco: Not on file  . Alcohol Use: No   OB History   Grav Para Term Preterm Abortions TAB SAB Ect Mult Living                 Review of Systems  Musculoskeletal: Positive for arthralgias.  Skin: Positive for color change (bruising of foot/ankle).  Neurological: Positive for weakness and numbness.  All other systems reviewed and are negative.     Allergies  Ciprofloxacin; Demerol; Iohexol; Levaquin; Morphine and related; Penicillins; Percocet; Sulfa antibiotics; and Terramycin  Home Medications   Prior to Admission medications   Not on File    BP 148/79  Pulse 107  Temp(Src) 98.7 F (37.1 C) (Oral)  Resp 20  Ht 5\' 6"  (1.676 m)  Wt 185 lb (83.915 kg)  BMI 29.87 kg/m2  SpO2 97% Physical Exam  Constitutional: She is oriented to person, place, and time. She appears well-developed and well-nourished. No distress.  HENT:  Head: Normocephalic and atraumatic.  Right Ear: Hearing normal.  Left Ear: Hearing normal.  Nose: Nose normal.  Mouth/Throat: Oropharynx is clear and moist and mucous membranes are normal.  Eyes: Conjunctivae and EOM are normal. Pupils are equal, round, and reactive to light.  Neck: Normal range of motion. Neck supple.  Cardiovascular: Regular rhythm, S1 normal and S2 normal.  Exam reveals no gallop and no friction rub.   No murmur heard. Pulses:      Dorsalis pedis pulses are 1+ on the right side, and 1+ on the left side.  Pulmonary/Chest: Effort normal and breath sounds normal. No respiratory distress. She exhibits no tenderness.  Abdominal: Soft. Normal appearance and bowel sounds are normal. There is no hepatosplenomegaly. There is no tenderness. There is no rebound, no guarding, no tenderness at McBurney's point and negative Murphy's sign. No hernia.  Musculoskeletal:       Right ankle: She exhibits decreased range of motion, swelling and ecchymosis.       Right foot: She exhibits decreased range of motion, tenderness and swelling.  Neurological: She is alert and oriented to person, place, and time. She has normal strength. A cranial nerve deficit (Blunting of the right nasolabial fold) and sensory deficit (subjective decreased sensation to light touch right face, right arm, right leg) is present. She exhibits abnormal muscle tone (right upper extremity). Coordination normal. GCS eye subscore is 4. GCS verbal subscore is 5. GCS motor subscore is 6.  Extraocular muscle movement: normal No visual field cut Pupils: equal and reactive both direct and consensual response is normal No nystagmus present     Sensory function is reduced to light touch on right side of body  Grip strength slightly reduced in right upper extremity compared to left. Pronator drift is present on the right. Normal finger to nose bilaterally   Gait: Not tested because of ankle injury  Skin: Skin is warm, dry and intact. No rash noted. No cyanosis.  Psychiatric: She has a normal mood and affect. Her speech is normal and behavior is normal. Thought content normal.    ED Course  Procedures (including critical care time) Labs Review Labs Reviewed  CBC - Abnormal; Notable for the following:    WBC 23.0 (*)    Platelets 481 (*)    All other components within normal limits  DIFFERENTIAL - Abnormal; Notable for the following:    Neutrophils Relative % 81 (*)    Neutro Abs 18.7 (*)    Lymphocytes Relative 10 (*)    Monocytes Absolute 2.0 (*)    All other components within normal limits  COMPREHENSIVE METABOLIC PANEL - Abnormal; Notable for the following:    Sodium 132 (*)    Potassium 3.1 (*)    Chloride 91 (*)    Glucose, Bld 124 (*)    BUN 5 (*)    Anion gap 18 (*)    All other components within normal limits  URINE RAPID DRUG SCREEN (HOSP PERFORMED) - Abnormal; Notable for the following:    Opiates POSITIVE (*)    Benzodiazepines POSITIVE (*)    All other components within normal limits  URINALYSIS, ROUTINE W REFLEX MICROSCOPIC - Abnormal; Notable for the following:    APPearance HAZY (*)    Hgb urine dipstick SMALL (*)    Ketones, ur TRACE (*)    Leukocytes, UA TRACE (*)    All other components within normal limits  URINE MICROSCOPIC-ADD ON - Abnormal; Notable for the following:    Squamous Epithelial / LPF FEW (*)    All other components within normal limits  I-STAT CHEM 8, ED - Abnormal; Notable for the following:    Sodium 131 (*)    Potassium 3.0 (*)    BUN <3 (*)    Glucose, Bld 128 (*)    Calcium, Ion 1.07 (*)    Hemoglobin 16.0 (*)    HCT 47.0 (*)    All other components within normal  limits  ETHANOL  PROTIME-INR  APTT  I-STAT TROPOININ, ED    Imaging Review Dg Ankle Complete Right  02/19/2014   CLINICAL DATA:  Right foot and ankle pain with bruising. Swelling. Twisting injury last night.  EXAM: RIGHT ANKLE - COMPLETE 3+ VIEW  COMPARISON:  None.  FINDINGS: There is diffuse prominent soft tissue swelling around the ankle. There is no fracture or dislocation or appreciable joint effusion.  IMPRESSION: Soft tissue swelling.   Electronically Signed   By: Rozetta Nunnery M.D.   On: 02/19/2014 19:14   Ct Head Wo Contrast  02/19/2014   CLINICAL DATA:  Numbness of the right side of the face and body. Twisted ankle twice. No known head injury.  EXAM: CT HEAD WITHOUT CONTRAST  TECHNIQUE: Contiguous axial images were obtained from the base of the skull through the vertex without intravenous contrast.  COMPARISON:  None applicable  FINDINGS: Within the left thalamus, there is subtle low-attenuation lesion. The findings are consistent with infarct. The age is favored to be subacute or chronic. There are mild periventricular white matter changes. No intracranial hemorrhage. No significant mass effect.  Bone windows show significant mucoperiosteal thickening of the right maxillary sinus without air-fluid level. No acute fractures are identified.  IMPRESSION: 1. 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 There is no associated hemorrhage. 2. Chronic right maxillary sinusitis. 3. The findings were discussed with Keshan Reha on 02/19/2014 at 6:51 pm.   Electronically Signed   By: Shon Hale M.D.   On: 02/19/2014 18:53   Dg Foot Complete Right  02/19/2014   CLINICAL DATA:  Numbness of the foot.  EXAM: RIGHT FOOT COMPLETE - 3+ VIEW  COMPARISON:  None.  FINDINGS: Diffuse soft tissue swelling is present over the dorsum of the foot and anterior to the ankle. There is no displaced fracture identified. Osteopenia. Hallux valgus.  IMPRESSION: Diffuse soft tissue swelling over the dorsum  of the foot. No osseous injury.   Electronically Signed   By: Dereck Ligas M.D.   On: 02/19/2014 19:05     EKG Interpretation   Date/Time:  Sunday February 19 2014 17:10:33 EDT Ventricular Rate:  113 PR Interval:  172 QRS Duration: 85 QT Interval:  344 QTC Calculation: 472 R Axis:   42 Text Interpretation:  Sinus tachycardia Low voltage, precordial leads  Nonspecific T abnrm, anterolateral leads No significant change since last  tracing Confirmed by Riley Hallum  MD, Breannah Kratt [phone]) on 02/19/2014 5:30:04  PM      MDM   Final diagnoses:  None   acute/subacute CVA  Ankle sprain  The patient initially presented for swelling of her right foot and ankle. Patient reports previous surgeries and fractures, was concerned it might be broken. She did not have pain because of persistent neuropathy. She did have significant bruising and swelling of the ankle and proximal foot, but x-rays did not show any fracture.  Patient was also complaining of tingling on her right side. Further evaluation revealed the distribution of right sided numbness and weakness including right face, arm and leg. She did have some evidence of deficit, both sensory and motor. CVA was considered likely. Patient's symptoms began at least 24 hours ago, likely longer. She initially said yesterday morning, then reported that it was probably the night before. No TPA intervention is therefore possible. CT scan does show left thalamic infarct which would explain the patient's current symptoms.  Patient reportedly failed her swallow study. She was administered rectal suppository aspirin. She will be admitted to the hospital for further management.  Orpah Greek, MD 02/19/14 [phone]

## 2014-02-19 NOTE — ED Notes (Signed)
Report given to RN on 300 

## 2014-02-19 NOTE — ED Notes (Signed)
Reports twisting right ankle last night x 2.  States is unable to feel anything in that foot today with numbness.  Obvious bruising/swelling noted.

## 2014-02-19 NOTE — H&P (Signed)
[facility] Admission History and Physical       Robin Blackburn VEH:[id number] DOB: 10-21-1945 DOA: 02/19/2014  Referring physician:   EDP PCP: Purvis Kilts, MD  Specialists:   Chief Complaint:  Right Face and Arm Weakness, Falls with Right Foot and Ankle Pain  HPI: Robin Blackburn is a 68 y.o. female with a history of RA, HTN, Neuropathy, Fibromyalgia and Anxiety/Depression who presents to the [facility] ED with complaints increased pain of her right foot and ankleafter falling and twisting her right ankle twice during the past 2 days.  She also reports that she has not felt right , and has had increased Dizziness and thought it was her inner ear for the past 2 days, and she also noticed that she had numbness and weakness of the right side of her face and into her right arm since yesterday.   She denies having any headache or syncope or chest pain.  She was evaluated in the ED and was found to have a Sub-Acute Left Thalamic infarct  And X-rays of her Right ankle and foot were negative for fractures.    She was referred for medical admission.      Review of Systems:  Constitutional: No Weight Loss, No Weight Gain, Night Sweats, Fevers, Chills, +Dizziness, Fatigue, or Generalized Weakness HEENT: No Headaches, Difficulty Swallowing,Tooth/Dental Problems,Sore Throat,  No Sneezing, Rhinitis, Ear Ache, Nasal Congestion, or Post Nasal Drip,  Cardio-vascular:  No Chest pain, Orthopnea, PND, Edema in Lower Extremities, Anasarca, +Dizziness, Palpitations  Resp: No Dyspnea, No DOE, No Cough, No Hemoptysis, No Wheezing.    GI: No Heartburn, Indigestion, Abdominal Pain, Nausea, Vomiting, Diarrhea, Hematemesis, Hematochezia, Melena, Change in Bowel Habits,  Loss of Appetite  GU: No Dysuria, Change in Color of Urine, No Urgency or Frequency, No Flank pain.  Musculoskeletal: +Right Foot and right Ankle Pain and Swelling, +Decreased Range of Motion of right Ankle due to Swelling, +Chronic Low Back  Pain.  Neurologic: No Syncope, No Seizures, +Right Face and Right Upper Extremity Weakness and Paresthesia, No Vision Disturbance or Loss, No Diplopia, No Vertigo, Difficulty Walking,  Skin: No Rash or Lesions. Psych: No Change in Mood or Affect, +Depression, +Anxiety, No Memory loss, No Confusion, or Hallucinations   Past Medical History  Diagnosis Date  . Neuropathy   . Hypertension   . Anxiety   . Depression   . Acid reflux        Rheumatoid Arthritis      Fibromyalgia      Chronic Low Back Pain    Past Surgical History  Procedure Laterality Date  . Back surgery    . Knee surgery    . Ankle reconstruction    . Abdominal hysterectomy    . Appendectomy    . Cholecystectomy         Prior to Admission medications   Medication Sig Start Date End Date Taking? Authorizing Provider  ALPRAZolam Duanne Moron) 1 MG tablet Take 1 mg by mouth every 6 (six) hours as needed. anxiety 01/31/14  Yes Historical Provider, MD  lansoprazole (PREVACID) 30 MG capsule Take 30 mg by mouth 2 (two) times daily. 02/17/14  Yes Historical Provider, MD  BENICAR 20 MG tablet Take 20 mg by mouth daily. 11/23/13   Historical Provider, MD  dicyclomine (BENTYL) 20 MG tablet Take 20 mg by mouth every 6 (six) hours as needed. Abdominal cramps 01/31/14   Historical Provider, MD  hydrochlorothiazide (MICROZIDE) 12.5 MG capsule Take 12.5 mg by mouth  daily. 01/31/14   Historical Provider, MD  HYDROcodone-acetaminophen (NORCO/VICODIN) 5-325 MG per tablet Take 1 tablet by mouth every 6 (six) hours as needed. pain 01/18/14   Historical Provider, MD  hydroxychloroquine (PLAQUENIL) 200 MG tablet Take 200 mg by mouth 2 (two) times daily. 01/20/14   Historical Provider, MD  ondansetron (ZOFRAN) 8 MG tablet Take 8 mg by mouth every 8 (eight) hours as needed. Nausea & vomitting 01/03/14   Historical Provider, MD  ondansetron (ZOFRAN-ODT) 4 MG disintegrating tablet Take 4 mg by mouth every 8 (eight) hours as needed. Nausea & vomitting  01/19/14   Historical Provider, MD  PREMARIN 0.625 MG tablet Take 0.625 mg by mouth daily. 02/17/14   Historical Provider, MD  RESTASIS 0.05 % ophthalmic emulsion Place 1 drop into both eyes 2 (two) times daily. 01/31/14   Historical Provider, MD  Vitamin D, Ergocalciferol, (DRISDOL) 50000 UNITS CAPS capsule Take 50,000 Units by mouth once a week. monday 01/19/14   Historical Provider, MD      Allergies  Allergen Reactions  . Ciprofloxacin   . Demerol [Meperidine]   . Iohexol   . Levaquin [Levofloxacin In D5w]   . Morphine And Related   . Penicillins   . Percocet [Oxycodone-Acetaminophen]   . Sulfa Antibiotics   . Terramycin [Oxytetracycline]      Social History:  reports that she has never smoked. She does not have any smokeless tobacco history on file. She reports that she does not drink alcohol or use illicit drugs.     Family History  Problem Relation Age of Onset  . Hypertension Father   . Hypertension Brother   . Transient ischemic attack Father   . CVA Maternal Grandfather   . Leukemia Brother        Physical Exam:  GEN:  Pleasant Elderly Obese 68 y.o. Caucasian female examined and in no acute distress; cooperative with exam Filed Vitals:   02/19/14 1945 02/19/14 2000 02/19/14 2015 02/19/14 2030  BP:  135/78  151/87  Pulse: 103  102   Temp:      TempSrc:      Resp: 15 14 10 19   Height:      Weight:      SpO2: 99%  98%    Blood pressure 151/87, pulse 102, temperature 98.7 F (37.1 C), temperature source Oral, resp. rate 19, height 5\' 6"  (1.676 m), weight 83.915 kg (185 lb), SpO2 98.00%. PSYCH: She is alert and oriented x4; does not appear anxious does not appear depressed; affect is normal HEENT: Normocephalic and Atraumatic, Mucous membranes pink; PERRLA; EOM intact; Fundi:  Benign;  No scleral icterus, Nares: Patent, Oropharynx: Clear, Fair Dentition,    Neck:  FROM, No Cervical Lymphadenopathy nor Thyromegaly or Carotid Bruit; No JVD; Breasts:: Not  examined CHEST WALL: No tenderness CHEST: Normal respiration, clear to auscultation bilaterally HEART: Regular rate and rhythm; no murmurs rubs or gallops BACK: No kyphosis or scoliosis; No CVA tenderness ABDOMEN: Positive Bowel Sounds, Obese, Soft Non-Tender; No Masses, No Organomegaly. Rectal Exam: Not done EXTREMITIES: +EDEMA and ECCHYMOSIS of the RIGHT FOOT and ANKLE,    LLE:  No Cyanosis, Clubbing, or Edema; No Ulcerations. Genitalia: not examined PULSES: 2+ and symmetric SKIN: Normal hydration no rash or ulceration   CNS:  Mental Status:  Alert, Oriented, Thought Content Appropriate. Speech Fluent without evidence of Aphasia. Able to follow 3 step commands without difficulty.  In No obvious pain.   Cranial Nerves:  II: Discs flat bilaterally; Visual fields Intact,  Pupils equal and reactive.    III,IV, VI: Extra-ocular motions intact bilaterally     V,VII: smile symmetric, facial light touch sensation-Decreased in Right Face     VIII: hearing intact bilaterally    IX,X: gag reflex present    XI: bilateral shoulder shrug    XII: midline tongue extension   Motor:  Right:  Upper extremity 5- Minus /5     Left:  Upper extremity 5/5     Right:  Lower extremity 5/5    Left:  Lower extremity 5/5     Tone and Bulk:  normal tone throughout; no atrophy noted  Sensory:  Pinprick and light touch intact throughout, bilaterally   Deep Tendon Reflexes: 2+ and symmetric throughout   Plantars/ Babinski:  Right:  Deferred due to Injury and Swelling    Left: normal    Cerebellar:  Finger to nose without difficulty.   Gait: deferred   Vascular: pulses palpable throughout    Labs on Admission:  Basic Metabolic Panel:  Recent Labs Lab 02/19/14 1741 02/19/14 1800 02/19/14 1805  NA 132*  --  131*  K 3.1*  --  3.0*  CL 91*  --  96  CO2 23  --   --   GLUCOSE 124*  --  128*  BUN 5*  --  <3*  CREATININE 0.51  --  0.50  CALCIUM 9.1  --   --   MG  --  1.8  --    Liver Function  Tests:  Recent Labs Lab 02/19/14 1741  AST 16  ALT 15  ALKPHOS 84  BILITOT 0.6  PROT 7.6  ALBUMIN 3.8   No results found for this basename: LIPASE, AMYLASE,  in the last 168 hours No results found for this basename: AMMONIA,  in the last 168 hours CBC:  Recent Labs Lab 02/19/14 1741 02/19/14 1805  WBC 23.0*  --   NEUTROABS 18.7*  --   HGB 14.5 16.0*  HCT 41.4 47.0*  MCV 93.5  --   PLT 481*  --    Cardiac Enzymes: No results found for this basename: CKTOTAL, CKMB, CKMBINDEX, TROPONINI,  in the last 168 hours  BNP (last 3 results) No results found for this basename: PROBNP,  in the last 8760 hours CBG: No results found for this basename: GLUCAP,  in the last 168 hours  Radiological Exams on Admission: Dg Ankle Complete Right  02/19/2014   CLINICAL DATA:  Right foot and ankle pain with bruising. Swelling. Twisting injury last night.  EXAM: RIGHT ANKLE - COMPLETE 3+ VIEW  COMPARISON:  None.  FINDINGS: There is diffuse prominent soft tissue swelling around the ankle. There is no fracture or dislocation or appreciable joint effusion.  IMPRESSION: Soft tissue swelling.   Electronically Signed   By: Rozetta Nunnery M.D.   On: 02/19/2014 19:14   Ct Head Wo Contrast  02/19/2014   CLINICAL DATA:  Numbness of the right side of the face and body. Twisted ankle twice. No known head injury.  EXAM: CT HEAD WITHOUT CONTRAST  TECHNIQUE: Contiguous axial images were obtained from the base of the skull through the vertex without intravenous contrast.  COMPARISON:  None applicable  FINDINGS: Within the left thalamus, there is subtle low-attenuation lesion. The findings are consistent with infarct. The age is favored to be subacute or chronic. There are mild periventricular white matter changes. No intracranial hemorrhage. No significant mass effect.  Bone windows show significant mucoperiosteal thickening of the right maxillary sinus  without air-fluid level. No acute fractures are identified.   IMPRESSION: 1. 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 There is no associated hemorrhage. 2. Chronic right maxillary sinusitis. 3. The findings were discussed with CHRISTOPHER POLLINA on 02/19/2014 at 6:51 pm.   Electronically Signed   By: Shon Hale M.D.   On: 02/19/2014 18:53   Dg Foot Complete Right  02/19/2014   CLINICAL DATA:  Numbness of the foot.  EXAM: RIGHT FOOT COMPLETE - 3+ VIEW  COMPARISON:  None.  FINDINGS: Diffuse soft tissue swelling is present over the dorsum of the foot and anterior to the ankle. There is no displaced fracture identified. Osteopenia. Hallux valgus.  IMPRESSION: Diffuse soft tissue swelling over the dorsum of the foot. No osseous injury.   Electronically Signed   By: Dereck Ligas M.D.   On: 02/19/2014 19:05     EKG: Independently reviewed.  Sinus Tachycardia rate 113, Nonspecific S-T changes   Assessment/Plan:   68 y.o. female with  Principal Problem:   1.   CVA (cerebral infarction)   CVA workup initiated   Telemetry Monitoring, Neuro Checks   MRI/MRA of Brain, 2 D ECHO in AM   Check HbA1c, Fasting Lipids in AM   PT/OT/Speech Evaluations in AM   ASA Rx   Neuro Consult in AM  Active Problems:   2.   Right ankle sprain/ and Contusion of right foot   "RICE" Rx  ( Rest, Ice, Compression, Elevation)         3.   Essential hypertension   IV Hydralazine PRN SBP > 160   HTN Medications on Hold while NPO     4.   Neuropathy   PRN IV Dilaudid while NPO     5.   Anxiety   PRN IV Ativan while NPO     6.   Hyponatremia - due to Fluid Overlolad   Fluid Restriction   Monitor Na+ Trend   2-D ECHO in AM        7.   Hypokalemia- due to HCTZ   IV KCL Replacement           8.   Chronic Pain syndrome- due to Rheumatoid Arthritis/ Fibromyalgia/ Chronic Lower Back Pain   PRN IV Dilaudid while NPO     9.   DVT Prophylaxis    Lovenox     Code Status:    FULL CODE Family Communication:    Brother and Aunt at  Bedside Disposition Plan:      OBservation  Time spent:  52 M[place]utes  [facility] Pager [phone]   If Killian Please Contact the Day Rounding Team MD for [facility]  If 7PM-7AM, Please Contact night-coverage  [email or web address] Password [email or web address] 02/19/2014, 9:00 PM

## 2014-02-20 ENCOUNTER — Inpatient Hospital Stay (HOSPITAL_COMMUNITY): Payer: Medicare Other

## 2014-02-20 ENCOUNTER — Observation Stay (HOSPITAL_COMMUNITY): Payer: Medicare Other

## 2014-02-20 DIAGNOSIS — G894 Chronic pain syndrome: Secondary | ICD-10-CM | POA: Diagnosis present

## 2014-02-20 DIAGNOSIS — M069 Rheumatoid arthritis, unspecified: Secondary | ICD-10-CM | POA: Diagnosis present

## 2014-02-20 DIAGNOSIS — M545 Low back pain, unspecified: Secondary | ICD-10-CM | POA: Diagnosis present

## 2014-02-20 DIAGNOSIS — S93409A Sprain of unspecified ligament of unspecified ankle, initial encounter: Secondary | ICD-10-CM | POA: Diagnosis present

## 2014-02-20 DIAGNOSIS — D72829 Elevated white blood cell count, unspecified: Secondary | ICD-10-CM | POA: Diagnosis present

## 2014-02-20 DIAGNOSIS — I6789 Other cerebrovascular disease: Secondary | ICD-10-CM

## 2014-02-20 DIAGNOSIS — I6350 Cerebral infarction due to unspecified occlusion or stenosis of unspecified cerebral artery: Secondary | ICD-10-CM | POA: Diagnosis present

## 2014-02-20 DIAGNOSIS — Z806 Family history of leukemia: Secondary | ICD-10-CM | POA: Diagnosis not present

## 2014-02-20 DIAGNOSIS — F411 Generalized anxiety disorder: Secondary | ICD-10-CM | POA: Diagnosis present

## 2014-02-20 DIAGNOSIS — G589 Mononeuropathy, unspecified: Secondary | ICD-10-CM | POA: Diagnosis present

## 2014-02-20 DIAGNOSIS — E871 Hypo-osmolality and hyponatremia: Secondary | ICD-10-CM | POA: Diagnosis present

## 2014-02-20 DIAGNOSIS — Z79899 Other long term (current) drug therapy: Secondary | ICD-10-CM | POA: Diagnosis not present

## 2014-02-20 DIAGNOSIS — Z823 Family history of stroke: Secondary | ICD-10-CM | POA: Diagnosis not present

## 2014-02-20 DIAGNOSIS — I10 Essential (primary) hypertension: Secondary | ICD-10-CM | POA: Diagnosis present

## 2014-02-20 DIAGNOSIS — R5381 Other malaise: Secondary | ICD-10-CM | POA: Diagnosis present

## 2014-02-20 DIAGNOSIS — Z8249 Family history of ischemic heart disease and other diseases of the circulatory system: Secondary | ICD-10-CM | POA: Diagnosis not present

## 2014-02-20 DIAGNOSIS — E876 Hypokalemia: Secondary | ICD-10-CM | POA: Diagnosis present

## 2014-02-20 DIAGNOSIS — W19XXXA Unspecified fall, initial encounter: Secondary | ICD-10-CM | POA: Diagnosis present

## 2014-02-20 DIAGNOSIS — IMO0001 Reserved for inherently not codable concepts without codable children: Secondary | ICD-10-CM | POA: Diagnosis present

## 2014-02-20 DIAGNOSIS — T502X5A Adverse effect of carbonic-anhydrase inhibitors, benzothiadiazides and other diuretics, initial encounter: Secondary | ICD-10-CM | POA: Diagnosis present

## 2014-02-20 DIAGNOSIS — K219 Gastro-esophageal reflux disease without esophagitis: Secondary | ICD-10-CM | POA: Diagnosis present

## 2014-02-20 LAB — BASIC METABOLIC PANEL
ANION GAP: 13 (ref 5–15)
BUN: 5 mg/dL — ABNORMAL LOW (ref 6–23)
CHLORIDE: 98 meq/L (ref 96–112)
CO2: 25 mEq/L (ref 19–32)
Calcium: 8.7 mg/dL (ref 8.4–10.5)
Creatinine, Ser: 0.58 mg/dL (ref 0.50–1.10)
GFR calc non Af Amer: >90 mL/min (ref >90)
Glucose, Bld: 134 mg/dL — ABNORMAL HIGH (ref 70–99)
POTASSIUM: 3.6 meq/L — AB (ref 3.7–5.3)
Sodium: 136 mEq/L — ABNORMAL LOW (ref 137–147)

## 2014-02-20 LAB — CBC
HCT: 37.2 % (ref 36.0–46.0)
Hemoglobin: 12.9 g/dL (ref 12.0–15.0)
MCH: 32.8 pg (ref 26.0–34.0)
MCHC: 34.7 g/dL (ref 30.0–36.0)
MCV: 94.7 fL (ref 78.0–100.0)
PLATELETS: 417 10*3/uL — AB (ref 150–400)
RBC: 3.93 MIL/uL (ref 3.87–5.11)
RDW: 13.2 % (ref 11.5–15.5)
WBC: 14.6 10*3/uL — AB (ref 4.0–10.5)

## 2014-02-20 LAB — HEMOGLOBIN A1C
Hgb A1c MFr Bld: 5.9 % — ABNORMAL HIGH (ref <5.7)
Mean Plasma Glucose: 123 mg/dL — ABNORMAL HIGH (ref <117)

## 2014-02-20 LAB — LIPID PANEL
CHOL/HDL RATIO: 4.1 ratio
Cholesterol: 253 mg/dL — ABNORMAL HIGH (ref 0–200)
HDL: 62 mg/dL (ref >39)
LDL Cholesterol: 129 mg/dL — ABNORMAL HIGH (ref 0–99)
Triglycerides: 309 mg/dL — ABNORMAL HIGH (ref <150)
VLDL: 62 mg/dL — AB (ref 0–40)

## 2014-02-20 LAB — POCT I-STAT TROPONIN I: Troponin i, poc: 0 ng/mL (ref 0.00–0.08)

## 2014-02-20 LAB — MAGNESIUM: MAGNESIUM: 2.1 mg/dL (ref 1.5–2.5)

## 2014-02-20 LAB — URIC ACID: URIC ACID, SERUM: 4.8 mg/dL (ref 2.4–7.0)

## 2014-02-20 MED ORDER — CETYLPYRIDINIUM CHLORIDE 0.05 % MT LIQD
7.0000 mL | Freq: Two times a day (BID) | OROMUCOSAL | Status: DC
  Administered 2014-02-21 – 2014-02-22 (×4): 7 mL via OROMUCOSAL

## 2014-02-20 MED ORDER — PANTOPRAZOLE SODIUM 40 MG PO TBEC
40.0000 mg | DELAYED_RELEASE_TABLET | Freq: Every day | ORAL | Status: DC
  Administered 2014-02-20: 40 mg via ORAL
  Filled 2014-02-20 (×2): qty 1

## 2014-02-20 MED ORDER — SIMVASTATIN 20 MG PO TABS
20.0000 mg | ORAL_TABLET | Freq: Every day | ORAL | Status: DC
  Administered 2014-02-20 – 2014-02-21 (×2): 20 mg via ORAL
  Filled 2014-02-20 (×2): qty 1

## 2014-02-20 MED ORDER — SIMETHICONE 80 MG PO CHEW
160.0000 mg | CHEWABLE_TABLET | Freq: Four times a day (QID) | ORAL | Status: DC | PRN
  Administered 2014-02-22: 160 mg via ORAL
  Filled 2014-02-20: qty 2

## 2014-02-20 MED ORDER — LIDOCAINE 5 % EX PTCH
1.0000 | MEDICATED_PATCH | CUTANEOUS | Status: DC
  Administered 2014-02-20 – 2014-02-21 (×2): 1 via TRANSDERMAL
  Filled 2014-02-20 (×4): qty 1

## 2014-02-20 MED ORDER — FENTANYL CITRATE 0.05 MG/ML IJ SOLN
25.0000 ug | INTRAMUSCULAR | Status: DC | PRN
  Administered 2014-02-20 – 2014-02-21 (×5): 25 ug via INTRAVENOUS
  Filled 2014-02-20 (×5): qty 2

## 2014-02-20 MED ORDER — ENOXAPARIN SODIUM 40 MG/0.4ML ~~LOC~~ SOLN
40.0000 mg | SUBCUTANEOUS | Status: DC
  Administered 2014-02-20 – 2014-02-21 (×2): 40 mg via SUBCUTANEOUS
  Filled 2014-02-20 (×2): qty 0.4

## 2014-02-20 MED ORDER — NYSTATIN 100000 UNIT/GM EX CREA
TOPICAL_CREAM | Freq: Two times a day (BID) | CUTANEOUS | Status: DC
  Administered 2014-02-20: 22:00:00 via TOPICAL
  Administered 2014-02-21: 1 via TOPICAL
  Administered 2014-02-21: 23:00:00 via TOPICAL
  Administered 2014-02-22: 1 via TOPICAL
  Filled 2014-02-20: qty 15

## 2014-02-20 NOTE — Progress Notes (Signed)
[facility] PROGRESS NOTE  Robin Blackburn HCW:[id number] DOB: November 26, 1945 DOA: 02/19/2014 PCP: Purvis Kilts, MD  Assessment/Plan: 1. Acute CVA. Confirmed on MRI brain. Echocardiogram is unremarkable. MRA head did not show any critical lesions. Lipid panel shows LDL >100, so patient has been started on a statin. Carotid dopplers are in process. She has not been taking aspirin on a daily basis, therefore, we will recommend daily aspirin for secondary prevention. PT/OT have recommended SNF. Speech therapy consult is in process 2. Right ankle sprain. Patient reported that she had inverted her ankle, unknowingly. Xrays did not show any fractures in right ankle or foot.  She is complaining of pain in her left ankle and left knee.  Will obtain xrays of these joints.  Will also check venous dopplers to rule out underlying DVT. 3. Leukocytosis. Source is not entirely clear.  She is currently afebrile.  She does not appear septic or toxic. Denies any recent steroids.  Continue to follow clinically 4. Hypokalemia, replace. 5. Anxiety. on prn benzos 6. Rheumatoid arthritis. Follow up with Dr. Estanislado Pandy 7. Chronic pain syndrome/fibromyalgia 8. Essential hypertension. Stable 9. GERD. Continue protonix.  Code Status: full code Family Communication: discussed with patient and family Disposition Plan: SNF placement   Consultants:    Procedures:    Antibiotics:    HPI/Subjective: Complaining of pain in her feet bilaterally and her left knee  Objective: Filed Vitals:   02/20/14 1535  BP: 128/71  Pulse: 107  Temp: 99.5 F (37.5 C)  Resp: 20    Intake/Output Summary (Last 24 hours) at 02/20/14 1604 Last data filed at 02/20/14 0730  Gross per 24 hour  Intake    481 ml  Output    250 ml  Net    231 ml   Filed Weights   02/19/14 1648 02/19/14 2124  Weight: 83.915 kg (185 lb) 90.7 kg (199 lb 15.3 oz)    Exam:   General:  Anxious, no distress   Cardiovascular: s1, s2,  rrr  Respiratory: cta b  Abdomen: soft, nontender, bs+  Musculoskeletal: right foot has contusions, edema, tender, left foot has some edema, tender with range of motion   Data Reviewed: Basic Metabolic Panel:  Recent Labs Lab 02/19/14 1741 02/19/14 1800 02/19/14 1805  NA 132*  --  131*  K 3.1*  --  3.0*  CL 91*  --  96  CO2 23  --   --   GLUCOSE 124*  --  128*  BUN 5*  --  <3*  CREATININE 0.51  --  0.50  CALCIUM 9.1  --   --   MG  --  1.8  --    Liver Function Tests:  Recent Labs Lab 02/19/14 1741  AST 16  ALT 15  ALKPHOS 84  BILITOT 0.6  PROT 7.6  ALBUMIN 3.8   No results found for this basename: LIPASE, AMYLASE,  in the last 168 hours No results found for this basename: AMMONIA,  in the last 168 hours CBC:  Recent Labs Lab 02/19/14 1741 02/19/14 1805  WBC 23.0*  --   NEUTROABS 18.7*  --   HGB 14.5 16.0*  HCT 41.4 47.0*  MCV 93.5  --   PLT 481*  --    Cardiac Enzymes: No results found for this basename: CKTOTAL, CKMB, CKMBINDEX, TROPONINI,  in the last 168 hours BNP (last 3 results) No results found for this basename: PROBNP,  in the last 8760 hours CBG: No results found for this  basename: GLUCAP,  in the last 168 hours  No results found for this or any previous visit (from the past 240 hour(s)).   Studies: Dg Ankle Complete Right  02/19/2014   CLINICAL DATA:  Right foot and ankle pain with bruising. Swelling. Twisting injury last night.  EXAM: RIGHT ANKLE - COMPLETE 3+ VIEW  COMPARISON:  None.  FINDINGS: There is diffuse prominent soft tissue swelling around the ankle. There is no fracture or dislocation or appreciable joint effusion.  IMPRESSION: Soft tissue swelling.   Electronically Signed   By: Rozetta Nunnery M.D.   On: 02/19/2014 19:14   Ct Head Wo Contrast  02/19/2014   CLINICAL DATA:  Numbness of the right side of the face and body. Twisted ankle twice. No known head injury.  EXAM: CT HEAD WITHOUT CONTRAST  TECHNIQUE: Contiguous axial images  were obtained from the base of the skull through the vertex without intravenous contrast.  COMPARISON:  None applicable  FINDINGS: Within the left thalamus, there is subtle low-attenuation lesion. The findings are consistent with infarct. The age is favored to be subacute or chronic. There are mild periventricular white matter changes. No intracranial hemorrhage. No significant mass effect.  Bone windows show significant mucoperiosteal thickening of the right maxillary sinus without air-fluid level. No acute fractures are identified.  IMPRESSION: 1. 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 There is no associated hemorrhage. 2. Chronic right maxillary sinusitis. 3. The findings were discussed with CHRISTOPHER POLLINA on 02/19/2014 at 6:51 pm.   Electronically Signed   By: Shon Hale M.D.   On: 02/19/2014 18:53   Mr Jodene Nam Head Wo Contrast  02/20/2014   CLINICAL DATA:  Right-sided weakness and numbness.  EXAM: MRI HEAD WITHOUT CONTRAST  MRA HEAD WITHOUT CONTRAST  TECHNIQUE: Multiplanar, multiecho pulse sequences of the brain and surrounding structures were obtained without intravenous contrast. Angiographic images of the head were obtained using MRA technique without contrast.  COMPARISON:  Head CT 02/19/2014.  FINDINGS: MRI HEAD FINDINGS  Diffusion imaging shows an acute 1 cm infarction within the left thalamus laterally. No evidence of swelling or hemorrhage. No other acute infarction.  The brainstem and cerebellum do not show any focal insult. Elsewhere within the cerebral hemispheres, there are moderate chronic small vessel ischemic changes within the deep and subcortical white matter. No large vessel territory infarction. No mass lesion, hemorrhage, hydrocephalus or extra-axial collection. No pituitary mass. There is mucosal inflammation affecting the right maxillary sinus.  MRA HEAD FINDINGS  Both internal carotid arteries are widely patent into the brain. The anterior and middle cerebral  vessels are patent without proximal stenosis, aneurysm or vascular malformation.  Both vertebral arteries are patent to the basilar with the left being dominant. No basilar stenosis. Flow is present within both posterior inferior cerebellar arteries, the left anterior inferior cerebellar artery, both superior cerebellar arteries and both posterior cerebral arteries. More distal branch vessels show atherosclerotic irregularity, particularly affecting the right PCA branches.  IMPRESSION: 1 cm acute infarction within the left lateral thalamus. No hemorrhage or mass effect.  Moderate chronic small vessel ischemic changes elsewhere throughout the cerebral hemispheric white matter.  No major vessel occlusion or correctable proximal stenosis. Distal vessel atherosclerotic narrowing, particularly notable in the PCA branches right more than left.   Electronically Signed   By: Nelson Chimes M.D.   On: 02/20/2014 09:51   Mr Brain Wo Contrast  02/20/2014   CLINICAL DATA:  Right-sided weakness and numbness.  EXAM: MRI HEAD  WITHOUT CONTRAST  MRA HEAD WITHOUT CONTRAST  TECHNIQUE: Multiplanar, multiecho pulse sequences of the brain and surrounding structures were obtained without intravenous contrast. Angiographic images of the head were obtained using MRA technique without contrast.  COMPARISON:  Head CT 02/19/2014.  FINDINGS: MRI HEAD FINDINGS  Diffusion imaging shows an acute 1 cm infarction within the left thalamus laterally. No evidence of swelling or hemorrhage. No other acute infarction.  The brainstem and cerebellum do not show any focal insult. Elsewhere within the cerebral hemispheres, there are moderate chronic small vessel ischemic changes within the deep and subcortical white matter. No large vessel territory infarction. No mass lesion, hemorrhage, hydrocephalus or extra-axial collection. No pituitary mass. There is mucosal inflammation affecting the right maxillary sinus.  MRA HEAD FINDINGS  Both internal carotid  arteries are widely patent into the brain. The anterior and middle cerebral vessels are patent without proximal stenosis, aneurysm or vascular malformation.  Both vertebral arteries are patent to the basilar with the left being dominant. No basilar stenosis. Flow is present within both posterior inferior cerebellar arteries, the left anterior inferior cerebellar artery, both superior cerebellar arteries and both posterior cerebral arteries. More distal branch vessels show atherosclerotic irregularity, particularly affecting the right PCA branches.  IMPRESSION: 1 cm acute infarction within the left lateral thalamus. No hemorrhage or mass effect.  Moderate chronic small vessel ischemic changes elsewhere throughout the cerebral hemispheric white matter.  No major vessel occlusion or correctable proximal stenosis. Distal vessel atherosclerotic narrowing, particularly notable in the PCA branches right more than left.   Electronically Signed   By: Nelson Chimes M.D.   On: 02/20/2014 09:51   Dg Foot Complete Right  02/19/2014   CLINICAL DATA:  Numbness of the foot.  EXAM: RIGHT FOOT COMPLETE - 3+ VIEW  COMPARISON:  None.  FINDINGS: Diffuse soft tissue swelling is present over the dorsum of the foot and anterior to the ankle. There is no displaced fracture identified. Osteopenia. Hallux valgus.  IMPRESSION: Diffuse soft tissue swelling over the dorsum of the foot. No osseous injury.   Electronically Signed   By: Dereck Ligas M.D.   On: 02/19/2014 19:05    Scheduled Meds: .  stroke: mapping our early stages of recovery book   Does not apply Once  . aspirin  300 mg Rectal Daily   Or  . aspirin  325 mg Oral Daily  . enoxaparin (LOVENOX) injection  40 mg Subcutaneous Q24H  . lidocaine  1 patch Transdermal Q24H  . nystatin cream   Topical BID  . pantoprazole  40 mg Oral Daily  . simvastatin  20 mg Oral q1800   Continuous Infusions: . sodium chloride 10 mL/hr at 02/19/14 2204    Principal Problem:   CVA  (cerebral infarction) Active Problems:   Right ankle sprain   Contusion of right foot   Essential hypertension   Neuropathy   Anxiety   Hyponatremia   Hypokalemia    Time spent: 60mins    Zeniah Briney  [facility] Pager [phone]. If 7PM-7AM, please contact night-coverage at [email or web address], password [facility] 02/20/2014, 4:04 PM  LOS: 1 day

## 2014-02-20 NOTE — Progress Notes (Signed)
OT Cancellation Note  Patient Details Name: Robin Blackburn MRN: [id number] DOB: [birth year]   Cancelled Treatment:    Reason Eval/Treat Not Completed: Patient at procedure or test/ unavailable. Pt on bed pan then receiving ultrasound.   Ailene Ravel, OTR/L,CBIS   02/20/2014, 10:16 AM

## 2014-02-20 NOTE — Progress Notes (Signed)
  Echocardiogram 2D Echocardiogram has been performed.  Robin Blackburn, [place] 02/20/2014, 10:09 AM

## 2014-02-20 NOTE — Progress Notes (Signed)
Utilization review completed.  

## 2014-02-20 NOTE — Evaluation (Signed)
Clinical/Bedside Swallow Evaluation Patient Details  Name: Robin Blackburn MRN: [id number] Date of Birth: 12-26-1945  Today's Date: 02/20/2014 Time: 2353-6144 SLP Time Calculation (min): 36 min  Past Medical History:  Past Medical History  Diagnosis Date  . Neuropathy   . Hypertension   . Anxiety   . Depression   . Acid reflux   . Rheumatoid arthritis   . Fibromyalgia   . Stroke 02/17/14   Past Surgical History:  Past Surgical History  Procedure Laterality Date  . Back surgery    . Knee surgery    . Ankle reconstruction    . Abdominal hysterectomy    . Appendectomy    . Cholecystectomy     HPI:  Robin Blackburn is a 68 y.o. female with a history of RA, HTN, Neuropathy, Fibromyalgia and Anxiety/Depression who presents to the [facility] ED with complaints increased pain of her right foot and ankleafter falling and twisting her right ankle twice during the past 2 days.  She also reports that she has not felt right , and has had increased Dizziness and thought it was her inner ear for the past 2 days, and she also noticed that she had numbness and weakness of the right side of her face and into her right arm since yesterday.   She denies having any headache or syncope or chest pain.  She was evaluated in the ED and was found to have a Sub-Acute Left Thalamic infarct  And X-rays of her Right ankle and foot were negative for fractures.She was referred for medical admission. SLP asked to complete BSE due to pt failing RN swallow screen.    Assessment / Plan / Recommendation Clinical Impression  Oral motor examination is essentially WFL, except pt reports "numbness" wrapping around her face. Pt very hesitant to take po trials, but showed no signs or symptoms of aspiration with thin liquids. She seemingly swallowed puree without incident,  however she had strong complaints of globus sensation in pharynx. Liquid wash did not seem to help. Pt with history of reflux, so perhaps she is having referred  reflux symptoms (she was belching a lot too). Pt nervous to try any more. Will recommend full liquids tonight with SLP to reassess tomorrow around noon with possible MBSS if globus sensation persists. Pt does not feel she can take her medications orally at this time. Above to RN and MD. SLP will follow.    Aspiration Risk  Mild    Diet Recommendation Thin liquid (full liquids)   Liquid Administration via: Cup;Straw Medication Administration: Via alternative means Supervision: Intermittent supervision to cue for compensatory strategies Compensations: Slow rate;Multiple dry swallows after each bite/sip Postural Changes and/or Swallow Maneuvers: Seated upright 90 degrees;Upright 30-60 min after meal    Other  Recommendations Recommended Consults:  (possible MBSS tomorrow pending re-eval in room) Oral Care Recommendations: Oral care BID;Patient independent with oral care Other Recommendations: Clarify dietary restrictions   Follow Up Recommendations       Frequency and Duration min 2x/week  2 weeks   Pertinent Vitals/Pain VSS    SLP Swallow Goals   Pt will demonstrate safe and efficient consumption of least restrictive diet with use of strategies as needed.   Swallow Study Prior Functional Status   Lived alone    General Date of Onset: 02/19/14 HPI: Robin Blackburn is a 68 y.o. female with a history of RA, HTN, Neuropathy, Fibromyalgia and Anxiety/Depression who presents to the [facility] ED with complaints increased pain  of her right foot and ankleafter falling and twisting her right ankle twice during the past 2 days.  She also reports that she has not felt right , and has had increased Dizziness and thought it was her inner ear for the past 2 days, and she also noticed that she had numbness and weakness of the right side of her face and into her right arm since yesterday.   She denies having any headache or syncope or chest pain.  She was evaluated in the ED and was found to have a  Sub-Acute Left Thalamic infarct  And X-rays of her Right ankle and foot were negative for fractures.She was referred for medical admission. SLP asked to complete BSE due to pt failing RN swallow screen.  Type of Study: Bedside swallow evaluation Diet Prior to this Study: NPO Temperature Spikes Noted: No Respiratory Status: Room air History of Recent Intubation: No Behavior/Cognition: Alert;Pleasant mood;Cooperative Oral Cavity - Dentition: Adequate natural dentition Self-Feeding Abilities: Able to feed self Patient Positioning: Upright in bed Baseline Vocal Quality: Clear Volitional Cough: Strong Volitional Swallow: Able to elicit    Oral/Motor/Sensory Function Overall Oral Motor/Sensory Function: Appears within functional limits for tasks assessed Labial ROM: Within Functional Limits Labial Symmetry: Within Functional Limits Labial Strength: Within Functional Limits Labial Sensation: Reduced Lingual ROM: Within Functional Limits Lingual Symmetry: Within Functional Limits Lingual Strength: Within Functional Limits Lingual Sensation: Within Functional Limits Facial ROM: Within Functional Limits Facial Symmetry: Within Functional Limits Facial Strength: Within Functional Limits Facial Sensation: Reduced Velum: Within Functional Limits Mandible: Within Functional Limits   Ice Chips Ice chips: Within functional limits Presentation: Spoon   Thin Liquid Thin Liquid: Within functional limits Presentation: Cup;Self Fed;Straw    Nectar Thick Nectar Thick Liquid: Not tested   Honey Thick Honey Thick Liquid: Not tested   Puree Puree: Impaired Presentation: Spoon Pharyngeal Phase Impairments: Other (comments) (pt with report of globus sensation)   Solid   GO    Solid: Not tested (Pt declined)       [name] 02/20/2014,5:21 PM

## 2014-02-20 NOTE — Clinical Social Work Psychosocial (Signed)
Clinical Social Work Department BRIEF PSYCHOSOCIAL ASSESSMENT 02/20/2014  Patient:  Robin Blackburn, Robin Blackburn     Account Number:  [id number]     Admit date:  02/19/2014  Clinical Social Worker:  Wyatt Haste  Date/Time:  02/20/2014 02:18 PM  Referred by:  CSW  Date Referred:  02/20/2014 Referred for  SNF Placement   Other Referral:   Interview type:  Patient Other interview type:    PSYCHOSOCIAL DATA Living Status:  ALONE Admitted from facility:   Level of care:   Primary support name:  Robin Blackburn/Robin Blackburn/Robin Blackburn Primary support relationship to patient:  FAMILY Degree of support available:   supportive    CURRENT CONCERNS Current Concerns  Post-Acute Placement   Other Concerns:    SOCIAL WORK ASSESSMENT / PLAN CSW met with pt at bedside. Pt alert and oriented and reports she lives at home alone. She called EMS yesterday after experiencing dizziness and numbness. Admitted with CVA. Pt reports that her best support is her brother, Robin Blackburn and an aunt and cousin who also live nearby. Pt lives in an apartment and said she generally manages okay. She has someone to clean and get her groceries as well as take her to doctor's appointments as pt does not drive. Pt typically fixes either microwave meals or other simple meals. At baseline, she ambulates with a walker. PT evaluated pt today and recommendation is for SNF. CSW discussed placement process and provided SNF list. Pt is very open to SNF as she states she would be scared to return home right now due to stroke. CSW provided brief support. She requests [facility] if possible.   Assessment/plan status:  Psychosocial Support/Ongoing Assessment of Needs Other assessment/ plan:   Information/referral to community resources:   SNF list    PATIENT'S/FAMILY'S RESPONSE TO PLAN OF CARE: Pt reports positive feelings regarding short term SNF prior to return home. CSW will initiate bed search and follow up with offers when available.       Robin Blackburn, [place]

## 2014-02-20 NOTE — Clinical Social Work Placement (Signed)
Clinical Social Work Department CLINICAL SOCIAL WORK PLACEMENT NOTE 02/20/2014  Patient:  Robin Blackburn, Robin Blackburn  Account Number:  [id number] Admit date:  02/19/2014  Clinical Social Worker:  [name], LCSW  Date/time:  02/20/2014 02:16 PM  Clinical Social Work is seeking post-discharge placement for this patient at the following level of care:   [name]   (*CSW will update this form in Epic as items are completed)   02/20/2014  Patient/family provided with [place] Department of Clinical Social Work's list of facilities offering this level of care within the geographic area requested by the patient (or if unable, by the patient's family).  02/20/2014  Patient/family informed of their freedom to choose among providers that offer the needed level of care, that participate in Medicare, Medicaid or managed care program needed by the patient, have an available bed and are willing to accept the patient.  02/20/2014  Patient/family informed of [facility]' ownership interest in [facility], as well as of the fact that they are under no obligation to receive care at this facility.  PASARR submitted to EDS on 02/20/2014 PASARR number received on 02/20/2014  FL2 transmitted to all facilities in geographic area requested by pt/family on  02/20/2014 FL2 transmitted to all facilities within larger geographic area on   Patient informed that his/her managed care company has contracts with or will negotiate with  certain facilities, including the following:     Patient/family informed of bed offers received:   Patient chooses bed at  Physician recommends and patient chooses bed at    Patient to be transferred to  on   Patient to be transferred to facility by  Patient and family notified of transfer on  Name of family member notified:    The following physician request were entered in Epic:   Additional Comments:  [name], [place]

## 2014-02-20 NOTE — Evaluation (Signed)
Physical Therapy Evaluation Patient Details Name: Robin Blackburn MRN: [id number] DOB: 08/06/1945 Today's Date: 02/20/2014   History of Present Illness  Pt is a 68 year old female who is admitted with a left thalamic stroke.  Her initial complaint was that of right facial numbness, right UE numbness/weakness and dizziness.  Prior to admission she fell against her recliner and sprained her right ankle.  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  Currently, her right ankle is wrapped with an Ace wrap and the dorsum of her foot is bruised and edemetous.  She is now complaining of servere pain in the arch of her left foot extending up her leg to her knee.  She lives alone and is very worried about being able to take care of herself at home.                   Clinical Impression   Pt is alert and oriented, cooperative for eval.  It was somewhat difficult to get a clear picture of her new symptoms due to her multiple other chronic problems, particularly her back dysfunction and neuropathy.  She continues to have numbness of the right face but motor strength appears intact.  She reports no visual disturbance.  A quick screen of her RUE shows mild weakness compared to the LUE.  Her strength of the RLE is WNL except in the right ankle due to recent sprain.  She does have 50% full active ROM in the ankle.  Pt strength in the LLE is WNL.   She tends to  Position her left foot in full inversion.  Her left foot does appear to have mild edema particularly over the dorsum and lateral malleolus.  We attempted to sit her at the EOB but pt was unable to hold this position due to severe dizziness.  I saw no nystagmus with sitting.  Rotation of her head does not cause dizziness.  Pt had  recently had IV pain med.  It is difficult to know if dizziness is partially due to this.  Pt remained extremely anxious during my visit and we had significant difficulty getting her comfortably positioned in the bed.  She requires a great deal of reassurance and encouragement.     Follow Up Recommendations SNF    Equipment Recommendations  None recommended by PT    Recommendations for Other Services OT consult;Speech consult     Precautions / Restrictions Precautions Precautions: Fall Restrictions Weight Bearing Restrictions: No Other Position/Activity Restrictions: weight bearing will naturally be limited on the right LE based on her pain      Mobility  Bed Mobility Overal bed mobility: Needs Assistance Bed Mobility: Sidelying to Sit;Rolling Rolling: Supervision Sidelying to sit: Supervision       General bed mobility comments: pt needs guard rail and HOB elevated to 30 degrees for transfer...pt became very dizzy upon sitting and was unable to sit for more than a few seconds  Transfers Overall transfer level:  (not tested due to dizziness)                  Ambulation/Gait Ambulation/Gait assistance:  (not tested due to dizziness)              Stairs  Wheelchair Mobility    Modified Rankin (Stroke Patients Only) Modified Rankin (Stroke Patients Only) Pre-Morbid Rankin Score: Moderate disability Modified Rankin: Severe disability     Balance Overall balance assessment: Needs assistance Sitting-balance support: Bilateral upper extremity supported;Feet supported Sitting balance-Leahy Scale: Poor Sitting balance - Comments: pt became dizzy upon sitting, so it is unknown as to her true balance...       Standing balance comment: not tested                             Pertinent Vitals/Pain Pain Assessment: 0-10 Pain Score: 10-Worst pain ever Pain Location: left foot, calf and knee Pain Descriptors / Indicators:  Throbbing;Cramping Pain Intervention(s): Patient requesting pain meds-RN notified;RN gave pain meds during session;Repositioned    Home Living Family/patient expects to be discharged to:: Skilled nursing facility Living Arrangements: Alone Available Help at Discharge: Friend(s);Available PRN/intermittently Type of Home: Apartment Home Access: Stairs to enter Entrance Stairs-Rails: Right Entrance Stairs-Number of Steps: 4 Home Layout: One level Home Equipment: Walker - 4 wheels;Shower seat      Prior Function Level of Independence: Independent with assistive device(s)               Hand Dominance   Dominant Hand: Right    Extremity/Trunk Assessment   Upper Extremity Assessment: Defer to OT evaluation           Lower Extremity Assessment: Overall WFL for tasks assessed (holds left foot in inversion, right ankle has 50% full active motion)         Communication   Communication: No difficulties  Cognition Arousal/Alertness: Awake/alert Behavior During Therapy: WFL for tasks assessed/performed Overall Cognitive Status: Within Functional Limits for tasks assessed                      General Comments      Exercises        Assessment/Plan    PT Assessment Patient needs continued PT services  PT Diagnosis Difficulty walking;Generalized weakness;Acute pain   PT Problem List Decreased strength;Decreased activity tolerance;Decreased balance;Decreased mobility  PT Treatment Interventions Gait training;Functional mobility training;Therapeutic exercise;Balance training   PT Goals (Current goals can be found in the Care Plan section) Acute Rehab PT Goals Patient Stated Goal: wants to be able to get back to independence at home PT Goal Formulation: With patient Time For Goal Achievement: 03/06/14 Potential to Achieve Goals: Good    Frequency Min 5X/week   Barriers to discharge Decreased caregiver support;Inaccessible home environment       Co-evaluation               End of Session   Activity Tolerance: Patient tolerated treatment well Patient left: in bed;with call bell/phone within reach;with family/visitor present Nurse Communication: Mobility status         Time: 4854-6270 PT Time Calculation (min): 62 min   Charges:   PT Evaluation $Initial PT Evaluation Tier I: 1 Procedure     PT G CodesDemetrios Isaacs L 02/20/2014, 12:14 PM

## 2014-02-21 ENCOUNTER — Inpatient Hospital Stay (HOSPITAL_COMMUNITY): Payer: Medicare Other

## 2014-02-21 DIAGNOSIS — M069 Rheumatoid arthritis, unspecified: Secondary | ICD-10-CM

## 2014-02-21 LAB — CBC
HCT: 36.1 % (ref 36.0–46.0)
Hemoglobin: 12 g/dL (ref 12.0–15.0)
MCH: 32.1 pg (ref 26.0–34.0)
MCHC: 33.2 g/dL (ref 30.0–36.0)
MCV: 96.5 fL (ref 78.0–100.0)
PLATELETS: 393 10*3/uL (ref 150–400)
RBC: 3.74 MIL/uL — ABNORMAL LOW (ref 3.87–5.11)
RDW: 13.5 % (ref 11.5–15.5)
WBC: 12 10*3/uL — AB (ref 4.0–10.5)

## 2014-02-21 LAB — BASIC METABOLIC PANEL
ANION GAP: 13 (ref 5–15)
BUN: 4 mg/dL — ABNORMAL LOW (ref 6–23)
CALCIUM: 8.5 mg/dL (ref 8.4–10.5)
CO2: 26 mEq/L (ref 19–32)
Chloride: 101 mEq/L (ref 96–112)
Creatinine, Ser: 0.59 mg/dL (ref 0.50–1.10)
GFR calc Af Amer: >90 mL/min (ref >90)
Glucose, Bld: 121 mg/dL — ABNORMAL HIGH (ref 70–99)
Potassium: 3.4 mEq/L — ABNORMAL LOW (ref 3.7–5.3)
SODIUM: 140 meq/L (ref 137–147)

## 2014-02-21 MED ORDER — HYDROCHLOROTHIAZIDE 12.5 MG PO CAPS
12.5000 mg | ORAL_CAPSULE | Freq: Every day | ORAL | Status: DC
  Administered 2014-02-21 – 2014-02-22 (×2): 12.5 mg via ORAL
  Filled 2014-02-21 (×2): qty 1

## 2014-02-21 MED ORDER — PANTOPRAZOLE SODIUM 40 MG IV SOLR
40.0000 mg | INTRAVENOUS | Status: DC
  Administered 2014-02-21: 40 mg via INTRAVENOUS
  Filled 2014-02-21: qty 40

## 2014-02-21 MED ORDER — ZOLPIDEM TARTRATE 5 MG PO TABS
5.0000 mg | ORAL_TABLET | Freq: Once | ORAL | Status: AC
  Administered 2014-02-21: 5 mg via ORAL
  Filled 2014-02-21: qty 1

## 2014-02-21 MED ORDER — CYCLOBENZAPRINE HCL 10 MG PO TABS: 5.0000 mg | ORAL_TABLET | Freq: Three times a day (TID) | ORAL | Status: DC | PRN

## 2014-02-21 MED ORDER — ALPRAZOLAM 1 MG PO TABS
1.0000 mg | ORAL_TABLET | Freq: Four times a day (QID) | ORAL | Status: DC | PRN
  Administered 2014-02-21 – 2014-02-22 (×3): 1 mg via ORAL
  Filled 2014-02-21 (×3): qty 1

## 2014-02-21 MED ORDER — ALPRAZOLAM 1 MG PO TABS: 1.0000 mg | ORAL_TABLET | Freq: Four times a day (QID) | ORAL | Status: DC | PRN

## 2014-02-21 MED ORDER — SIMVASTATIN 20 MG PO TABS: 20.0000 mg | ORAL_TABLET | Freq: Every day | ORAL | Status: DC

## 2014-02-21 MED ORDER — NYSTATIN 100000 UNIT/GM EX CREA: TOPICAL_CREAM | Freq: Two times a day (BID) | CUTANEOUS | Status: DC

## 2014-02-21 MED ORDER — POTASSIUM CHLORIDE CRYS ER 20 MEQ PO TBCR: 40.0000 meq | EXTENDED_RELEASE_TABLET | Freq: Once | ORAL | Status: DC

## 2014-02-21 MED ORDER — POTASSIUM CHLORIDE CRYS ER 20 MEQ PO TBCR
40.0000 meq | EXTENDED_RELEASE_TABLET | Freq: Every day | ORAL | Status: DC
  Administered 2014-02-22: 40 meq via ORAL
  Filled 2014-02-21 (×2): qty 2

## 2014-02-21 MED ORDER — HYDROCODONE-ACETAMINOPHEN 5-325 MG PO TABS: 1.0000 | ORAL_TABLET | Freq: Four times a day (QID) | ORAL | Status: DC | PRN

## 2014-02-21 MED ORDER — ALPRAZOLAM 1 MG PO TABS
1.0000 mg | ORAL_TABLET | Freq: Once | ORAL | Status: AC
  Administered 2014-02-21: 1 mg via ORAL
  Filled 2014-02-21: qty 1

## 2014-02-21 MED ORDER — METHOCARBAMOL 500 MG PO TABS
500.0000 mg | ORAL_TABLET | Freq: Four times a day (QID) | ORAL | Status: DC | PRN
  Administered 2014-02-21 – 2014-02-22 (×3): 500 mg via ORAL
  Filled 2014-02-21 (×3): qty 1

## 2014-02-21 MED ORDER — ASPIRIN 325 MG PO TABS: 325.0000 mg | ORAL_TABLET | Freq: Every day | ORAL | Status: DC

## 2014-02-21 MED ORDER — METHOCARBAMOL 500 MG PO TABS: 500.0000 mg | ORAL_TABLET | Freq: Four times a day (QID) | ORAL | Status: DC | PRN

## 2014-02-21 MED ORDER — PANTOPRAZOLE SODIUM 40 MG PO TBEC
40.0000 mg | DELAYED_RELEASE_TABLET | Freq: Every day | ORAL | Status: DC
  Administered 2014-02-21 – 2014-02-22 (×2): 40 mg via ORAL
  Filled 2014-02-21 (×2): qty 1

## 2014-02-21 NOTE — Procedures (Signed)
Objective Swallowing Evaluation: Modified Barium Swallowing Study  Patient Details  Name: Robin Blackburn MRN: [id number] Date of Birth: 05-25-1946  Today's Date: 02/21/2014 Time: 4166-0630 SLP Time Calculation (min): 46 min  Past Medical History:  Past Medical History  Diagnosis Date  . Neuropathy   . Hypertension   . Anxiety   . Depression   . Acid reflux   . Rheumatoid arthritis   . Fibromyalgia   . Stroke 02/17/14   Past Surgical History:  Past Surgical History  Procedure Laterality Date  . Back surgery    . Knee surgery    . Ankle reconstruction    . Abdominal hysterectomy    . Appendectomy    . Cholecystectomy     HPI:  Robin Blackburn is a 68 y.o. female with a history of RA, HTN, Neuropathy, Fibromyalgia and Anxiety/Depression who presents to the [facility] ED with complaints increased pain of her right foot and ankleafter falling and twisting her right ankle twice during the past 2 days.  She also reports that she has not felt right , and has had increased Dizziness and thought it was her inner ear for the past 2 days, and she also noticed that she had numbness and weakness of the right side of her face and into her right arm since yesterday.   She denies having any headache or syncope or chest pain.  She was evaluated in the ED and was found to have a Sub-Acute Left Thalamic infarct  And X-rays of her Right ankle and foot were negative for fractures.She was referred for medical admission. SLP asked to complete BSE due to pt failing RN swallow screen.      Assessment / Plan / Recommendation Clinical Impression  Dysphagia Diagnosis: Within Functional Limits Clinical impression: Ms. Ken was seen upright in hausted chair for MBSS due to pt reporting globus sensation and fear of choking. She required frequent reassurance before, during, and after the procedure. She requested Xanax before going down to radiology and took it without incident crushed in sherbet. Overall, objective  study shows only mild decreased tongue base retraction which resulted in mild/trace puree residuals in valleculae post swallow. The residual cleared with liquid wash. When pt was cued to "swallow hard" for the initial swallow, this resulted in no pharyngeal residuals. Mild residuals noted at CP which clear with repeat swallow. Pt was sensate to the trace residual and in fact seemed overly sensitive to its presence. She was reassured that she would be safe consuming a regular diet with thin liquids; implement effortful swallow for solids and alternate solids/liquids prn. No further SLP follow up indicated in acute setting; pt may benefit from SLP evaluation in SNF to determine any ongoing needs.    Treatment Recommendation  Defer treatment plan to SLP at (Comment)    Diet Recommendation Regular;Thin liquid   Liquid Administration via: Cup;Straw Medication Administration: Whole meds with liquid Supervision: Intermittent supervision to cue for compensatory strategies;Patient able to self feed Compensations: Follow solids with liquid;Effortful swallow Postural Changes and/or Swallow Maneuvers: Seated upright 90 degrees;Upright 30-60 min after meal    Other  Recommendations Oral Care Recommendations: Oral care BID;Patient independent with oral care Other Recommendations: Clarify dietary restrictions   Follow Up Recommendations  Skilled Nursing facility    Frequency and Duration  N/A     Pertinent Vitals/Pain VSS    SLP Swallow Goals     General Date of Onset: 02/19/14 HPI: Robin Blackburn is a 68 y.o.  female with a history of RA, HTN, Neuropathy, Fibromyalgia and Anxiety/Depression who presents to the [facility] ED with complaints increased pain of her right foot and ankleafter falling and twisting her right ankle twice during the past 2 days.  She also reports that she has not felt right , and has had increased Dizziness and thought it was her inner ear for the past 2 days, and she also  noticed that she had numbness and weakness of the right side of her face and into her right arm since yesterday.   She denies having any headache or syncope or chest pain.  She was evaluated in the ED and was found to have a Sub-Acute Left Thalamic infarct  And X-rays of her Right ankle and foot were negative for fractures.She was referred for medical admission. SLP asked to complete BSE due to pt failing RN swallow screen.  Type of Study: Modified Barium Swallowing Study Previous Swallow Assessment: BSE 02/20/2014 pt c/o residue with puree; full liquids Diet Prior to this Study: Thin liquids Temperature Spikes Noted: No Respiratory Status: Room air History of Recent Intubation: No Behavior/Cognition: Alert;Pleasant mood;Cooperative Oral Cavity - Dentition: Adequate natural dentition Self-Feeding Abilities: Able to feed self Patient Positioning: Upright in chair Baseline Vocal Quality: Clear Volitional Cough: Strong Volitional Swallow: Able to elicit    Reason for Referral     Oral Phase Oral Preparation/Oral Phase Oral Phase: WFL   Pharyngeal Phase Pharyngeal Phase Pharyngeal Phase: Impaired Pharyngeal - Thin Pharyngeal - Thin Cup: Within functional limits Pharyngeal - Thin Straw: Within functional limits Pharyngeal - Solids Pharyngeal - Puree: Premature spillage to valleculae;Reduced tongue base retraction;Pharyngeal residue - cp segment;Pharyngeal residue - valleculae (very mild residual, no residual when pt uses effortful swall) Pharyngeal - Regular: Within functional limits Pharyngeal - Pill: Within functional limits  Cervical Esophageal Phase    GO   Thank you,  Genene Churn, CCC-SLP [phone]  Cervical Esophageal Phase Cervical Esophageal Phase: [facility]         Robin Blackburn 02/21/2014, 5:04 PM

## 2014-02-21 NOTE — Clinical Social Work Placement (Signed)
Clinical Social Work Department CLINICAL SOCIAL WORK PLACEMENT NOTE 02/21/2014  Patient:  Robin Blackburn, Robin Blackburn  Account Number:  [id number] Admit date:  02/19/2014  Clinical Social Worker:  [name], LCSW Date/time:  02/20/2014 02:16 PM  Clinical Social Work is seeking post-discharge placement for this patient at the following level of care:   [place]   (*CSW will update this form in Epic as items are completed)   02/20/2014  Patient/family provided with [place] Department of Clinical Social Work's list of facilities offering this level of care within the geographic area requested by the patient (or if unable, by the patient's family).  02/20/2014  Patient/family informed of their freedom to choose among providers that offer the needed level of care, that participate in Medicare, Medicaid or managed care program needed by the patient, have an available bed and are willing to accept the patient.  02/20/2014  Patient/family informed of [facility]' ownership interest in [facility], as well as of the fact that they are under no obligation to receive care at this facility.  PASARR submitted to EDS on 02/20/2014 PASARR number received on 02/20/2014  FL2 transmitted to all facilities in geographic area requested by pt/family on  02/20/2014 FL2 transmitted to all facilities within larger geographic area on   Patient informed that his/her managed care company has contracts with or will negotiate with  certain facilities, including the following:     Patient/family informed of bed offers received:  02/21/2014 Patient chooses bed at [facility] Physician recommends and patient chooses bed at  [facility]  Patient to be transferred to  on   Patient to be transferred to facility by  Patient and family notified of transfer on  Name of family member notified:    The following physician request were entered in Epic:   Additional Comments:  [name],  [place]

## 2014-02-21 NOTE — Progress Notes (Signed)
Notified MD of patient's left foot cramps.  Will continue to monitor patient.

## 2014-02-21 NOTE — Discharge Summary (Signed)
Physician Discharge Summary  Robin Blackburn ZJI:[id number] DOB: [birth year] DOA: 02/19/2014  PCP: Purvis Kilts, MD  Admit date: 02/19/2014 Discharge date: 02/21/2014  Time spent: 40 minutes  Recommendations for Outpatient Follow-up:  1. Patient will be discharging to a Skilled nursing facility 2. Consider repeat CBC in 1 week to ensure leukocytosis has resolved  Discharge Diagnoses:  Principal Problem:   CVA (cerebral infarction) Active Problems:   Right ankle sprain   Contusion of right foot   Essential hypertension   Neuropathy   Anxiety   Hyponatremia   Hypokalemia   Leukocytosis, unspecified   Rheumatoid arthritis   Discharge Condition: stable  Diet recommendation: low salt  Filed Weights   02/19/14 1648 02/19/14 2124  Weight: 83.915 kg (185 lb) 90.7 kg (199 lb 15.3 oz)    History of present illness and hospital course:  This is a 68 year old female with multiple medical problems who presented to the emergency room with complaints of pain in her right foot/ankle after falling and twisting her right ankle twice during the past 2 days prior to admission. She also reported increasing dizziness, and numbness and weakness of the right side of her face and her right arm. It was felt that she may have an underlying CVA and therefore she was admitted to the hospital for further treatments. Patient underwent MRI/MRA of her brain which revealed a 1 cm acute infarction with a left lateral thalamus. The patient reports that she was not taking aspirin on a consistent basis. She was recommended to take a daily aspirin at this time. LDL was checked and was found to be greater than 100 so she was started on a statin. Carotid Dopplers and echocardiogram were found to be unremarkable. Hemoglobin A1c was only mildly elevated at 5.9. She was seen by physical therapy who recommended skilled nursing facility placement. The patient had concerns regarding her swallowing and therefore was seen by  speech therapy. She underwent modified barium swallow and perform very well. She has been cleared for regular diet with thin liquids.  Patient also complains of pain in her right foot. She was found to have extensive contusions and right foot likely related to injury from "twisting" her ankle. X-rays were done of her right foot and right ankle which were negative for any underlying fractures. She was advised to keep it elevated, iced and placed a compression dressing. She also complained of pain in her left foot and left knee. X-rays were unremarkable for any acute injury. She also underwent Dopplers of her lower extremities bilaterally which did not show any underlying DVTs. She's been advised to keep her lower extremities elevated to help with the swelling and to continue working with physical therapy.  The patient was also found to have significant leukocytosis with a WBC count of 23,000 on admission. She does not appear to be septic or toxic. She was not febrile. No clear source of infection could be determined. She does not report recently taking any steroids. She was started on IV fluids and her WBC count titrated down to 12. This could possibly be reactive to underlying process. Would recommend repeating CBC in one week to ensure resolution of leukocytosis.  The patient has very severe anxiety which amplifies the severity of many of her complaints. She is chronically on Xanax which has been continued  Procedures: Echo: - Procedure narrative: Transthoracic echocardiography. Image quality was suboptimal. The study was technically difficult, as a result of body habitus. - Left ventricle: The cavity size  was normal. Systolic function was normal. The estimated ejection fraction was in the range of 60% to 65%. Wall motion was normal; there were no regional wall motion abnormalities. Doppler parameters are consistent with abnormal left ventricular relaxation (grade 1 diastolic dysfunction). Mild to  moderate concentric left ventricular hypertrophy. - Mitral valve: Mildly calcified annulus. Normal thickness leaflets . There was trivial regurgitation.    Consultations:   Discharge Exam: Filed Vitals:   02/21/14 1449  BP: 129/92  Pulse: 94  Temp: 98.7 F (37.1 C)  Resp: 20    General: NAD, extremely anxious Cardiovascular: s1, s2, rrr Respiratory: cta b  Discharge Instructions You were cared for by a hospitalist during your hospital stay. If you have any questions about your discharge medications or the care you received while you were in the hospital after you are discharged, you can call the unit and asked to speak with the hospitalist on call if the hospitalist that took care of you is not available. Once you are discharged, your primary care physician will handle any further medical issues. Please note that NO REFILLS for any discharge medications will be authorized once you are discharged, as it is imperative that you return to your primary care physician (or establish a relationship with a primary care physician if you do not have one) for your aftercare needs so that they can reassess your need for medications and monitor your lab values.   Current Discharge Medication List    START taking these medications   Details  aspirin 325 MG tablet Take 1 tablet (325 mg total) by mouth daily.    methocarbamol (ROBAXIN) 500 MG tablet Take 1 tablet (500 mg total) by mouth every 6 (six) hours as needed for muscle spasms.    nystatin cream (MYCOSTATIN) Apply topically 2 (two) times daily. Qty: 30 g, Refills: 0    simvastatin (ZOCOR) 20 MG tablet Take 1 tablet (20 mg total) by mouth daily at 6 PM. Qty: 30 tablet      CONTINUE these medications which have CHANGED   Details  ALPRAZolam (XANAX) 1 MG tablet Take 1 tablet (1 mg total) by mouth every 6 (six) hours as needed. anxiety Qty: 30 tablet, Refills: 0    HYDROcodone-acetaminophen (NORCO/VICODIN) 5-325 MG per tablet Take 1  tablet by mouth every 6 (six) hours as needed. pain Qty: 30 tablet, Refills: 0      CONTINUE these medications which have NOT CHANGED   Details  dicyclomine (BENTYL) 20 MG tablet Take 20 mg by mouth every 6 (six) hours as needed. Abdominal cramps    hydrochlorothiazide (MICROZIDE) 12.5 MG capsule Take 25 mg by mouth daily.     hydroxychloroquine (PLAQUENIL) 200 MG tablet Take 200 mg by mouth 2 (two) times daily.    lansoprazole (PREVACID) 30 MG capsule Take 30 mg by mouth 2 (two) times daily.    lidocaine (LIDODERM) 5 % Place 1 patch onto the skin daily. Remove & Discard patch within 12 hours or as directed by MD    ondansetron (ZOFRAN) 8 MG tablet Take 8 mg by mouth every 8 (eight) hours as needed. Nausea & vomitting    RESTASIS 0.05 % ophthalmic emulsion Place 1 drop into both eyes 2 (two) times daily.    Vitamin D, Ergocalciferol, (DRISDOL) 50000 UNITS CAPS capsule Take 50,000 Units by mouth once a week. monday      STOP taking these medications     PREMARIN 0.625 MG tablet  Allergies  Allergen Reactions  . Demerol [Meperidine] Anaphylaxis  . Hydromorphone Anaphylaxis  . Ciprofloxacin   . Iohexol   . Levaquin [Levofloxacin In D5w]   . Penicillins   . Percocet [Oxycodone-Acetaminophen]   . Sulfa Antibiotics   . Terramycin [Oxytetracycline]   . Morphine And Related Rash      The results of significant diagnostics from this hospitalization (including imaging, microbiology, ancillary and laboratory) are listed below for reference.    Significant Diagnostic Studies: Dg Ankle Complete Left  2014-03-05   CLINICAL DATA:  Left ankle pain.  No known injury.  EXAM: LEFT ANKLE COMPLETE - 3+ VIEW  COMPARISON:  Previous left foot radiographs, CT and MR examinations.  FINDINGS: Mild diffuse soft tissue swelling. Diffuse osteopenia. Small to moderate-sized inferior calcaneal spur. There is some flattening of the normal plantar arch.  IMPRESSION: Mild diffuse soft tissue  swelling, inferior calcaneal spur and pes planus.   Electronically Signed   By: Enrique Sack M.D.   On: 03-05-14 17:46   Dg Ankle Complete Right  02/19/2014   CLINICAL DATA:  Right foot and ankle pain with bruising. Swelling. Twisting injury last night.  EXAM: RIGHT ANKLE - COMPLETE 3+ VIEW  COMPARISON:  None.  FINDINGS: There is diffuse prominent soft tissue swelling around the ankle. There is no fracture or dislocation or appreciable joint effusion.  IMPRESSION: Soft tissue swelling.   Electronically Signed   By: Rozetta Nunnery M.D.   On: 02/19/2014 19:14   Ct Head Wo Contrast  02/19/2014   CLINICAL DATA:  Numbness of the right side of the face and body. Twisted ankle twice. No known head injury.  EXAM: CT HEAD WITHOUT CONTRAST  TECHNIQUE: Contiguous axial images were obtained from the base of the skull through the vertex without intravenous contrast.  COMPARISON:  None applicable  FINDINGS: Within the left thalamus, there is subtle low-attenuation lesion. The findings are consistent with infarct. The age is favored to be subacute or chronic. There are mild periventricular white matter changes. No intracranial hemorrhage. No significant mass effect.  Bone windows show significant mucoperiosteal thickening of the right maxillary sinus without air-fluid level. No acute fractures are identified.  IMPRESSION: 1. 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 There is no associated hemorrhage. 2. Chronic right maxillary sinusitis. 3. The findings were discussed with CHRISTOPHER POLLINA on 02/19/2014 at 6:51 pm.   Electronically Signed   By: Shon Hale M.D.   On: 02/19/2014 18:53   Mr Jodene Nam Head Wo Contrast  03/05/14   CLINICAL DATA:  Right-sided weakness and numbness.  EXAM: MRI HEAD WITHOUT CONTRAST  MRA HEAD WITHOUT CONTRAST  TECHNIQUE: Multiplanar, multiecho pulse sequences of the brain and surrounding structures were obtained without intravenous contrast. Angiographic images of the head were  obtained using MRA technique without contrast.  COMPARISON:  Head CT 02/19/2014.  FINDINGS: MRI HEAD FINDINGS  Diffusion imaging shows an acute 1 cm infarction within the left thalamus laterally. No evidence of swelling or hemorrhage. No other acute infarction.  The brainstem and cerebellum do not show any focal insult. Elsewhere within the cerebral hemispheres, there are moderate chronic small vessel ischemic changes within the deep and subcortical white matter. No large vessel territory infarction. No mass lesion, hemorrhage, hydrocephalus or extra-axial collection. No pituitary mass. There is mucosal inflammation affecting the right maxillary sinus.  MRA HEAD FINDINGS  Both internal carotid arteries are widely patent into the brain. The anterior and middle cerebral vessels are patent without proximal  stenosis, aneurysm or vascular malformation.  Both vertebral arteries are patent to the basilar with the left being dominant. No basilar stenosis. Flow is present within both posterior inferior cerebellar arteries, the left anterior inferior cerebellar artery, both superior cerebellar arteries and both posterior cerebral arteries. More distal branch vessels show atherosclerotic irregularity, particularly affecting the right PCA branches.  IMPRESSION: 1 cm acute infarction within the left lateral thalamus. No hemorrhage or mass effect.  Moderate chronic small vessel ischemic changes elsewhere throughout the cerebral hemispheric white matter.  No major vessel occlusion or correctable proximal stenosis. Distal vessel atherosclerotic narrowing, particularly notable in the PCA branches right more than left.   Electronically Signed   By: Nelson Chimes M.D.   On: 02/20/2014 09:51   Mr Brain Wo Contrast  02/20/2014   CLINICAL DATA:  Right-sided weakness and numbness.  EXAM: MRI HEAD WITHOUT CONTRAST  MRA HEAD WITHOUT CONTRAST  TECHNIQUE: Multiplanar, multiecho pulse sequences of the brain and surrounding structures were  obtained without intravenous contrast. Angiographic images of the head were obtained using MRA technique without contrast.  COMPARISON:  Head CT 02/19/2014.  FINDINGS: MRI HEAD FINDINGS  Diffusion imaging shows an acute 1 cm infarction within the left thalamus laterally. No evidence of swelling or hemorrhage. No other acute infarction.  The brainstem and cerebellum do not show any focal insult. Elsewhere within the cerebral hemispheres, there are moderate chronic small vessel ischemic changes within the deep and subcortical white matter. No large vessel territory infarction. No mass lesion, hemorrhage, hydrocephalus or extra-axial collection. No pituitary mass. There is mucosal inflammation affecting the right maxillary sinus.  MRA HEAD FINDINGS  Both internal carotid arteries are widely patent into the brain. The anterior and middle cerebral vessels are patent without proximal stenosis, aneurysm or vascular malformation.  Both vertebral arteries are patent to the basilar with the left being dominant. No basilar stenosis. Flow is present within both posterior inferior cerebellar arteries, the left anterior inferior cerebellar artery, both superior cerebellar arteries and both posterior cerebral arteries. More distal branch vessels show atherosclerotic irregularity, particularly affecting the right PCA branches.  IMPRESSION: 1 cm acute infarction within the left lateral thalamus. No hemorrhage or mass effect.  Moderate chronic small vessel ischemic changes elsewhere throughout the cerebral hemispheric white matter.  No major vessel occlusion or correctable proximal stenosis. Distal vessel atherosclerotic narrowing, particularly notable in the PCA branches right more than left.   Electronically Signed   By: Nelson Chimes M.D.   On: 02/20/2014 09:51   US Carotid Bilateral  02/21/2014   CLINICAL DATA:  Stroke  EXAM: BILATERAL CAROTID DUPLEX ULTRASOUND  TECHNIQUE: Pearline Cables scale imaging, color Doppler and duplex ultrasound  were performed of bilateral carotid and vertebral arteries in the neck.  COMPARISON:  Brain MRA and MRI 02/20/2014  FINDINGS: Criteria: Quantification of carotid stenosis is based on velocity parameters that correlate the residual internal carotid diameter with NASCET-based stenosis levels, using the diameter of the distal internal carotid lumen as the denominator for stenosis measurement.  The following velocity measurements were obtained:  RIGHT  ICA:  65/23 cm/sec  CCA:  476/54 cm/sec  SYSTOLIC ICA/CCA RATIO:  0.5  DIASTOLIC ICA/CCA RATIO:  0.9  ECA:  60 cm/sec  LEFT  ICA:  54/13 cm/sec  CCA:  65/03 cm/sec  SYSTOLIC ICA/CCA RATIO:  0.8  DIASTOLIC ICA/CCA RATIO:  1.0  ECA:  54 cm/sec  RIGHT CAROTID ARTERY: Tortuous common carotid artery. No significant atherosclerotic plaque or evidence of stenosis.  RIGHT  VERTEBRAL ARTERY:  Patent with antegrade flow.  LEFT CAROTID ARTERY: Mildly tortuous anatomy. No significant atherosclerotic plaque or evidence of stenosis.  LEFT VERTEBRAL ARTERY:  Patent with normal antegrade flow.  IMPRESSION: 1. No significant atherosclerotic plaque or evidence of stenosis. 2. Vertebral arteries are patent with antegrade flow. 3. Tortuous vascular anatomy. Signed,  Criselda Peaches, MD  [facility]  [facility]   Electronically Signed   By: Jacqulynn Cadet M.D.   On: 02/21/2014 07:38   US Venous Img Lower Bilateral  02/21/2014   CLINICAL DATA:  Bilateral lower extremity pain and swelling  EXAM: BILATERAL LOWER EXTREMITY VENOUS DOPPLER ULTRASOUND  TECHNIQUE: Gray-scale sonography with graded compression, as well as color Doppler and duplex ultrasound were performed to evaluate the lower extremity deep venous systems from the level of the common femoral vein and including the common femoral, femoral, profunda femoral, popliteal and calf veins including the posterior tibial, peroneal and gastrocnemius veins when visible. The superficial  great saphenous vein was also interrogated. Spectral Doppler was utilized to evaluate flow at rest and with distal augmentation maneuvers in the common femoral, femoral and popliteal veins.  COMPARISON:  Prior left lower extremity duplex venous ultrasound 02/24/2008  FINDINGS: RIGHT LOWER EXTREMITY  Common Femoral Vein: No evidence of thrombus. Normal compressibility, respiratory phasicity and response to augmentation.  Saphenofemoral Junction: No evidence of thrombus. Normal compressibility and flow on color Doppler imaging.  Profunda Femoral Vein: No evidence of thrombus. Normal compressibility and flow on color Doppler imaging.  Femoral Vein: No evidence of thrombus. Normal compressibility, respiratory phasicity and response to augmentation.  Popliteal Vein: No evidence of thrombus. Normal compressibility, respiratory phasicity and response to augmentation.  Calf Veins: No evidence of thrombus. Normal compressibility and flow on color Doppler imaging.  Superficial Great Saphenous Vein: No evidence of thrombus. Normal compressibility and flow on color Doppler imaging.  Venous Reflux:  None.  Other Findings:  None.  LEFT LOWER EXTREMITY  Common Femoral Vein: No evidence of thrombus. Normal compressibility, respiratory phasicity and response to augmentation.  Saphenofemoral Junction: No evidence of thrombus. Normal compressibility and flow on color Doppler imaging.  Profunda Femoral Vein: No evidence of thrombus. Normal compressibility and flow on color Doppler imaging.  Femoral Vein: No evidence of thrombus. Normal compressibility, respiratory phasicity and response to augmentation.  Popliteal Vein: No evidence of thrombus. Normal compressibility, respiratory phasicity and response to augmentation.  Calf Veins: No evidence of thrombus. Normal compressibility and flow on color Doppler imaging.  Superficial Great Saphenous Vein: No evidence of thrombus. Normal compressibility and flow on color Doppler imaging.   Venous Reflux:  None.  Other Findings:  None.  IMPRESSION: No evidence of deep venous thrombosis.   Electronically Signed   By: Jacqulynn Cadet M.D.   On: 02/21/2014 07:47   Dg Knee Complete 4 Views Left  02/20/2014   CLINICAL DATA:  Left knee pain.  No known injury.  EXAM: LEFT KNEE - COMPLETE 4+ VIEW  COMPARISON:  Left knee MR dated 08/05/2005.  FINDINGS: Moderate medial and mild lateral joint space narrowing. Minimal medial spur formation. No effusion. Diffuse osteopenia.  IMPRESSION: Mild degenerative changes.   Electronically Signed   By: Enrique Sack M.D.   On: 02/20/2014 17:44   Dg Foot Complete Right  02/19/2014   CLINICAL DATA:  Numbness of the foot.  EXAM: RIGHT FOOT COMPLETE - 3+ VIEW  COMPARISON:  None.  FINDINGS: Diffuse soft tissue swelling is present over the dorsum  of the foot and anterior to the ankle. There is no displaced fracture identified. Osteopenia. Hallux valgus.  IMPRESSION: Diffuse soft tissue swelling over the dorsum of the foot. No osseous injury.   Electronically Signed   By: Dereck Ligas M.D.   On: 02/19/2014 19:05   Dg Swallowing Func-speech Pathology  02/21/2014   Ephraim Hamburger, CCC-SLP     02/21/2014  5:04 PM Objective Swallowing Evaluation: Modified Barium Swallowing Study   Patient Details  Name: Robin Blackburn MRN: [id number] Date of Birth: [birth year]  Today's Date: 02/21/2014 Time: 2683-4196 SLP Time Calculation (min): 46 min  Past Medical History:  Past Medical History  Diagnosis Date  . Neuropathy   . Hypertension   . Anxiety   . Depression   . Acid reflux   . Rheumatoid arthritis   . Fibromyalgia   . Stroke 02/17/14   Past Surgical History:  Past Surgical History  Procedure Laterality Date  . Back surgery    . Knee surgery    . Ankle reconstruction    . Abdominal hysterectomy    . Appendectomy    . Cholecystectomy     HPI:  Robin Blackburn is a 68 y.o. female with a history of RA, HTN,  Neuropathy, Fibromyalgia and Anxiety/Depression who presents to  the [facility] ED  with complaints increased pain of her right  foot and ankleafter falling and twisting her right ankle twice  during the past 2 days.  She also reports that she has not felt  right , and has had increased Dizziness and thought it was her  inner ear for the past 2 days, and she also noticed that she had  numbness and weakness of the right side of her face and into her  right arm since yesterday.   She denies having any headache or  syncope or chest pain.  She was evaluated in the ED and was found  to have a Sub-Acute Left Thalamic infarct  And X-rays of her  Right ankle and foot were negative for fractures.She was referred  for medical admission. SLP asked to complete BSE due to pt  failing RN swallow screen.      Assessment / Plan / Recommendation Clinical Impression  Dysphagia Diagnosis: Within Functional Limits Clinical impression: Ms. Gunnarson was seen upright in hausted chair  for MBSS due to pt reporting globus sensation and fear of  choking. She required frequent reassurance before, during, and  after the procedure. She requested Xanax before going down to  radiology and took it without incident crushed in sherbet.  Overall, objective study shows only mild decreased tongue base  retraction which resulted in mild/trace puree residuals in  valleculae post swallow. The residual cleared with liquid wash.  When pt was cued to "swallow hard" for the initial swallow, this  resulted in no pharyngeal residuals. Mild residuals noted at CP  which clear with repeat swallow. Pt was sensate to the trace  residual and in fact seemed overly sensitive to its presence. She  was reassured that she would be safe consuming a regular diet  with thin liquids; implement effortful swallow for solids and  alternate solids/liquids prn. No further SLP follow up indicated  in acute setting; pt may benefit from SLP evaluation in SNF to  determine any ongoing needs.    Treatment Recommendation  Defer treatment plan to SLP at (Comment)    Diet  Recommendation Regular;Thin liquid   Liquid Administration via: Cup;Straw Medication Administration: Whole meds  with liquid Supervision: Intermittent supervision to cue for compensatory  strategies;Patient able to self feed Compensations: Follow solids with liquid;Effortful swallow Postural Changes and/or Swallow Maneuvers: Seated upright 90  degrees;Upright 30-60 min after meal    Other  Recommendations Oral Care Recommendations: Oral care  BID;Patient independent with oral care Other Recommendations: Clarify dietary restrictions   Follow Up Recommendations  Skilled Nursing facility    Frequency and Duration  N/A     Pertinent Vitals/Pain VSS    SLP Swallow Goals     General Date of Onset: 02/19/14 HPI: Robin Blackburn is a 68 y.o. female with a history of RA, HTN,  Neuropathy, Fibromyalgia and Anxiety/Depression who presents to  the [facility] ED with complaints increased pain of her right  foot and ankleafter falling and twisting her right ankle twice  during the past 2 days.  She also reports that she has not felt  right , and has had increased Dizziness and thought it was her  inner ear for the past 2 days, and she also noticed that she had  numbness and weakness of the right side of her face and into her  right arm since yesterday.   She denies having any headache or  syncope or chest pain.  She was evaluated in the ED and was found  to have a Sub-Acute Left Thalamic infarct  And X-rays of her  Right ankle and foot were negative for fractures.She was referred  for medical admission. SLP asked to complete BSE due to pt  failing RN swallow screen.  Type of Study: Modified Barium Swallowing Study Previous Swallow Assessment: BSE 02/20/2014 pt c/o residue with  puree; full liquids Diet Prior to this Study: Thin liquids Temperature Spikes Noted: No Respiratory Status: Room air History of Recent Intubation: No Behavior/Cognition: Alert;Pleasant mood;Cooperative Oral Cavity - Dentition: Adequate natural dentition  Self-Feeding Abilities: Able to feed self Patient Positioning: Upright in chair Baseline Vocal Quality: Clear Volitional Cough: Strong Volitional Swallow: Able to elicit    Reason for Referral     Oral Phase Oral Preparation/Oral Phase Oral Phase: WFL   Pharyngeal Phase Pharyngeal Phase Pharyngeal Phase: Impaired Pharyngeal - Thin Pharyngeal - Thin Cup: Within functional limits Pharyngeal - Thin Straw: Within functional limits Pharyngeal - Solids Pharyngeal - Puree: Premature spillage to valleculae;Reduced  tongue base retraction;Pharyngeal residue - cp segment;Pharyngeal  residue - valleculae (very mild residual, no residual when pt  uses effortful swall) Pharyngeal - Regular: Within functional limits Pharyngeal - Pill: Within functional limits  Cervical Esophageal Phase    GO   Thank you,  Genene Churn, CCC-SLP [phone]  Cervical Esophageal Phase Cervical Esophageal Phase: [facility]         PORTER,DABNEY 02/21/2014, 5:04 PM     Microbiology: No results found for this or any previous visit (from the past 240 hour(s)).   Labs: Basic Metabolic Panel:  Recent Labs Lab 02/19/14 1741 02/19/14 1800 02/19/14 1805 02/20/14 1630 02/21/14 0517  NA 132*  --  131* 136* 140  K 3.1*  --  3.0* 3.6* 3.4*  CL 91*  --  96 98 101  CO2 23  --   --  25 26  GLUCOSE 124*  --  128* 134* 121*  BUN 5*  --  <3* 5* 4*  CREATININE 0.51  --  0.50 0.58 0.59  CALCIUM 9.1  --   --  8.7 8.5  MG  --  1.8  --  2.1  --    Liver  Function Tests:  Recent Labs Lab 02/19/14 1741  AST 16  ALT 15  ALKPHOS 84  BILITOT 0.6  PROT 7.6  ALBUMIN 3.8   No results found for this basename: LIPASE, AMYLASE,  in the last 168 hours No results found for this basename: AMMONIA,  in the last 168 hours CBC:  Recent Labs Lab 02/19/14 1741 02/19/14 1805 02/20/14 1630 02/21/14 0517  WBC 23.0*  --  14.6* 12.0*  NEUTROABS 18.7*  --   --   --   HGB 14.5 16.0* 12.9 12.0  HCT 41.4 47.0* 37.2 36.1  MCV 93.5  --  94.7 96.5  PLT 481*   --  417* 393   Cardiac Enzymes: No results found for this basename: CKTOTAL, CKMB, CKMBINDEX, TROPONINI,  in the last 168 hours BNP: BNP (last 3 results) No results found for this basename: PROBNP,  in the last 8760 hours CBG: No results found for this basename: GLUCAP,  in the last 168 hours     Signed:  Sherell Christoffel  [facility] 02/21/2014, 7:19 PM

## 2014-02-21 NOTE — Progress Notes (Signed)
Pt declines PT today.  She states that she had a very bad night and doesn't feel that she is getting her normal medications (takes ativan for anxiety and ambien for sleep).  Pt did c/o evere cramping in the left ankle.  The muscle belly of the left anterior tibialis was fully contracted and the left foot was in full dorsiflexion with inversion.  I attempted to relax the foot with several relaxation strategies to no avail.  I reported this to the RN who will call the MD.

## 2014-02-21 NOTE — Clinical Social Work Note (Signed)
CSW presented bed offers and pt chooses [facility]. Facility notified. Awaiting stability for d/c.   [name], [place]

## 2014-02-21 NOTE — Evaluation (Signed)
Occupational Therapy Evaluation Patient Details Name: Robin Blackburn MRN: [id number] DOB: 1945-08-28 Today's Date: 02/21/2014    History of Present Illness Pt is a 68 year old female who is admitted with a left thalamic stroke.  Her initial complaint was that of right facial numbness, right UE numbness/weakness and dizziness.  Prior to admission she fell against her recliner and sprained her right ankle.  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  Currently, her right ankle is wrapped with an Ace wrap and the dorsum of her foot is bruised and edemetous.  She is now complaining of servere pain in the arch of her left foot extending up her leg to her knee.  She lives alone and is very worried about being able to take care of herself at home.                    Clinical Impression   Pt is presenting to acute OT with above situation.  She has increased anxiety regarding her current situation, hospital stay, and pending t/f to SNF.  Reassurance and education provided.  Pt has decreased strength and sensation in RUE, and will benefit from continued OT services.  Pt's anxiety and RUE deficits limit ADL and IADL engagement.  Recommend SNF OT at this time.    Follow Up Recommendations  SNF    Equipment Recommendations  Other (comment) (Defer to SNF at d/c.  Pt reports plans to purchase TTB)    Recommendations for Other Services       Precautions / Restrictions Precautions Precautions: Fall Restrictions Weight Bearing Restrictions: No Other Position/Activity Restrictions: weight bearing will naturally be limited on the right LE based on her pain      Mobility Bed Mobility                  Transfers                      Balance                                             ADL Overall ADL's : Needs assistance/impaired Eating/Feeding: Set up;Supervision/ safety;Cueing for compensatory techinques;Bed level Eating/Feeding Details (indicate cue type and reason): cueing for decreased proprioception Grooming: Supervision/safety;Bed level;Cueing for compensatory techniques Grooming Details (indicate cue type and reason): cueing for proprioception                                     Vision                     Perception     Praxis      Pertinent Vitals/Pain Pain Assessment: 0-10 Pain Score: 8  Pain Location: Left ankle Pain Descriptors / Indicators: Throbbing Pain Intervention(s): Repositioned;Relaxation (RN aware of pain)     Hand Dominance Right   Extremity/Trunk Assessment Upper Extremity Assessment Upper Extremity Assessment: RUE deficits/detail RUE Deficits / Details: Full RUE ROM. 3+/5 shoulder, 4/5 elbow, decreased grip strength bilateally. RUE Sensation: decreased light touch;decreased proprioception;history of peripheral neuropathy (decreased sharp/dull)  Lower Extremity Assessment Lower Extremity Assessment: Defer to PT evaluation       Communication Communication Communication: No difficulties (Pt indicated increased difficulty with hearing on R side)   Cognition Arousal/Alertness: Awake/alert Behavior During Therapy: WFL for tasks assessed/performed;Anxious Overall Cognitive Status: Within Functional Limits for tasks assessed                     General Comments       Exercises       Shoulder Instructions      Home Living Family/patient expects to be discharged to:: Skilled nursing facility Living Arrangements: Alone Available Help at Discharge: Friend(s);Available PRN/intermittently Type of Home: Apartment Home Access: Stairs to enter Entrance Stairs-Number of Steps: 4 Entrance Stairs-Rails: Right Home Layout: One level                Home Equipment: Walker - 4 wheels;Shower seat (Pt is working to purchase TTB)          Prior Functioning/Environment Level of Independence: Independent with assistive device(s);Needs assistance    ADL's / Homemaking Assistance Needed: pt was requirng assist with soe IADL needs - meal prep, cleaning, and traveling to MD appointments. Pt also requires assist for 4 steps into her apartment.  Pt has been taking sponge baths due to difficulty with shower t/f.        OT Diagnosis: Hemiplegia dominant side   OT Problem List: Decreased strength;Decreased coordination;Impaired sensation;Pain;Impaired UE functional use   OT Treatment/Interventions: Self-care/ADL training;Therapeutic exercise;Neuromuscular education;Energy conservation;DME and/or AE instruction;Therapeutic activities;Manual therapy;Patient/family education;Balance training    OT Goals(Current goals can be found in the care plan section) Acute Rehab OT Goals Patient Stated Goal: wants to be able to get back to independence at home OT Goal Formulation: With patient Time For Goal Achievement: 03/07/14 Potential to Achieve Goals: Good ADL Goals Pt Will Perform Lower Body Dressing: with min guard assist Pt Will Transfer to Toilet: with min guard assist Pt/caregiver will Perform Home Exercise Program: Increased strength;Right Upper extremity  OT Frequency: Min 2X/week   Barriers to D/C: Inaccessible home environment;Decreased caregiver support          Co-evaluation              End of Session    Activity Tolerance: Patient tolerated treatment well (pt limited by anxiety) Patient left: in bed;with call bell/phone within reach;with bed alarm set   Time: 8338-2505 OT Time Calculation (min): 29 min Charges:  OT General Charges $OT Visit: 1 Procedure OT Evaluation $Initial OT Evaluation Tier I: 1 Procedure G-Codes:    Bea Graff, MS, OTR/L [phone]  02/21/2014, 10:04 AM

## 2014-02-22 ENCOUNTER — Encounter (HOSPITAL_COMMUNITY): Payer: Self-pay | Admitting: Internal Medicine

## 2014-02-22 ENCOUNTER — Inpatient Hospital Stay
Admission: RE | Admit: 2014-02-22 | Discharge: 2014-03-24 | Disposition: A | Discharge status: Home / Self Care | Payer: Medicare Other | Source: Ambulatory Visit | Attending: Internal Medicine | Admitting: Internal Medicine

## 2014-02-22 DIAGNOSIS — R52 Pain, unspecified: Principal | ICD-10-CM

## 2014-02-22 LAB — BASIC METABOLIC PANEL
Anion gap: 12 (ref 5–15)
BUN: 5 mg/dL — AB (ref 6–23)
CHLORIDE: 100 meq/L (ref 96–112)
CO2: 28 mEq/L (ref 19–32)
CREATININE: 0.59 mg/dL (ref 0.50–1.10)
Calcium: 8.5 mg/dL (ref 8.4–10.5)
GFR calc non Af Amer: >90 mL/min (ref >90)
Glucose, Bld: 114 mg/dL — ABNORMAL HIGH (ref 70–99)
Potassium: 3.5 mEq/L — ABNORMAL LOW (ref 3.7–5.3)
Sodium: 140 mEq/L (ref 137–147)

## 2014-02-22 LAB — CBC WITH DIFFERENTIAL/PLATELET
BASOS PCT: 0 % (ref 0–1)
Basophils Absolute: 0 10*3/uL (ref 0.0–0.1)
EOS ABS: 0.1 10*3/uL (ref 0.0–0.7)
EOS PCT: 1 % (ref 0–5)
HCT: 37.1 % (ref 36.0–46.0)
HEMOGLOBIN: 12.1 g/dL (ref 12.0–15.0)
Lymphocytes Relative: 18 % (ref 12–46)
Lymphs Abs: 2.8 10*3/uL (ref 0.7–4.0)
MCH: 32.4 pg (ref 26.0–34.0)
MCHC: 32.6 g/dL (ref 30.0–36.0)
MCV: 99.2 fL (ref 78.0–100.0)
Monocytes Absolute: 1.6 10*3/uL — ABNORMAL HIGH (ref 0.1–1.0)
Monocytes Relative: 10 % (ref 3–12)
NEUTROS PCT: 71 % (ref 43–77)
Neutro Abs: 10.9 10*3/uL — ABNORMAL HIGH (ref 1.7–7.7)
Platelets: 408 10*3/uL — ABNORMAL HIGH (ref 150–400)
RBC: 3.74 MIL/uL — ABNORMAL LOW (ref 3.87–5.11)
RDW: 13.1 % (ref 11.5–15.5)
WBC: 15.4 10*3/uL — ABNORMAL HIGH (ref 4.0–10.5)

## 2014-02-22 LAB — CLOSTRIDIUM DIFFICILE BY PCR: Toxigenic C. Difficile by PCR: NEGATIVE

## 2014-02-22 LAB — GLUCOSE, CAPILLARY: Glucose-Capillary: 96 mg/dL (ref 70–99)

## 2014-02-22 MED ORDER — DIPHENOXYLATE-ATROPINE 2.5-0.025 MG PO TABS
1.0000 | ORAL_TABLET | Freq: Two times a day (BID) | ORAL | Status: DC | PRN
  Administered 2014-02-22: 1 via ORAL
  Filled 2014-02-22: qty 1

## 2014-02-22 MED ORDER — POTASSIUM CHLORIDE ER 20 MEQ PO TBCR: 20.0000 meq | EXTENDED_RELEASE_TABLET | Freq: Every day | ORAL | Status: DC

## 2014-02-22 MED ORDER — DIPHENOXYLATE-ATROPINE 2.5-0.025 MG PO TABS: 1.0000 | ORAL_TABLET | Freq: Two times a day (BID) | ORAL | Status: DC | PRN

## 2014-02-22 MED ORDER — ALPRAZOLAM 1 MG PO TABS: 1.0000 mg | ORAL_TABLET | Freq: Four times a day (QID) | ORAL | Status: DC | PRN

## 2014-02-22 MED ORDER — STROKE: EARLY STAGES OF RECOVERY BOOK
Freq: Once | Status: AC
  Administered 2014-02-22: 1
  Filled 2014-02-22: qty 1

## 2014-02-22 MED ORDER — ZOLPIDEM TARTRATE 5 MG PO TABS: 5.0000 mg | ORAL_TABLET | Freq: Every evening | ORAL | Status: DC | PRN

## 2014-02-22 MED ORDER — ACETAMINOPHEN 325 MG PO TABS
650.0000 mg | ORAL_TABLET | Freq: Four times a day (QID) | ORAL | Status: DC | PRN
  Administered 2014-02-22: 650 mg via ORAL
  Filled 2014-02-22: qty 2

## 2014-02-22 MED ORDER — ONDANSETRON HCL 8 MG PO TABS: 8.0000 mg | ORAL_TABLET | Freq: Three times a day (TID) | ORAL | Status: DC | PRN

## 2014-02-22 MED ORDER — METHOCARBAMOL 500 MG PO TABS: 500.0000 mg | ORAL_TABLET | Freq: Four times a day (QID) | ORAL | Status: DC | PRN

## 2014-02-22 MED ORDER — HYDROCODONE-ACETAMINOPHEN 5-325 MG PO TABS: 1.0000 | ORAL_TABLET | Freq: Four times a day (QID) | ORAL | Status: DC | PRN

## 2014-02-22 MED ORDER — SALINE SPRAY 0.65 % NA SOLN: 1.0000 | NASAL | Status: DC | PRN

## 2014-02-22 NOTE — Clinical Social Work Note (Signed)
Pt d/c today to [facility]. Pt, pt's brother Edd Arbour, and facility aware and agreeable. D/C summary and addendum faxed. Pt to transfer with RN.   [name], [place]

## 2014-02-22 NOTE — Clinical Social Work Placement (Signed)
Clinical Social Work Department CLINICAL SOCIAL WORK PLACEMENT NOTE 02/22/2014  Patient:  Robin Blackburn, Robin Blackburn  Account Number:  [id number] Admit date:  02/19/2014  Clinical Social Worker:  [name], LCSW  Date/time:  02/20/2014 02:16 PM  Clinical Social Work is seeking post-discharge placement for this patient at the following level of care:   [place]   (*CSW will update this form in Epic as items are completed)   02/20/2014  Patient/family provided with [place] Department of Clinical Social Work's list of facilities offering this level of care within the geographic area requested by the patient (or if unable, by the patient's family).  02/20/2014  Patient/family informed of their freedom to choose among providers that offer the needed level of care, that participate in Medicare, Medicaid or managed care program needed by the patient, have an available bed and are willing to accept the patient.  02/20/2014  Patient/family informed of [facility]' ownership interest in [facility], as well as of the fact that they are under no obligation to receive care at this facility.  PASARR submitted to EDS on 02/20/2014 PASARR number received on 02/20/2014  FL2 transmitted to all facilities in geographic area requested by pt/family on  02/20/2014 FL2 transmitted to all facilities within larger geographic area on   Patient informed that his/her managed care company has contracts with or will negotiate with  certain facilities, including the following:     Patient/family informed of bed offers received:  02/21/2014 Patient chooses bed at [facility] Physician recommends and patient chooses bed at  [facility]  Patient to be transferred to [facility] on  02/22/2014 Patient to be transferred to facility by RN Patient and family notified of transfer on 02/22/2014 Name of family member notified:  Edd Arbour- brother  The following physician request were  entered in Epic:   Additional Comments:  [name], [place]

## 2014-02-22 NOTE — Care Management Note (Signed)
    Page 1 of 1   02/22/2014     11:20:24 AM CARE MANAGEMENT NOTE 02/22/2014  Patient:  Robin Blackburn, Robin Blackburn   Account Number:  [id number]  Date Initiated:  02/22/2014  Documentation initiated by:  Theophilus Kinds  Subjective/Objective Assessment:   Pt admitted from home with CVA. Pt lives alone but will need SNF at discharge.     Action/Plan:   Pt discharged to [facility] today.   Anticipated DC Date:  02/22/2014   Anticipated DC Plan:  SKILLED NURSING FACILITY  In-house referral  Clinical Social Worker      DC Planning Services  CM consult      Choice offered to / List presented to:             Status of service:  Completed, signed off Medicare Important Message given?  YES (If response is "NO", the following Medicare IM given date fields will be blank) Date Medicare IM given:  02/22/2014 Medicare IM given by:  Theophilus Kinds Date Additional Medicare IM given:   Additional Medicare IM given by:    Discharge Disposition:  [place]  Per UR Regulation:    If discussed at Long Length of Stay Meetings, dates discussed:    Comments:  02/22/14 [place], RN BSN CM

## 2014-02-22 NOTE — Progress Notes (Signed)
Patient's IV removed and clean, dry, and intact at removal.  Patient transferred to [facility] via wheelchair by nurse tech.

## 2014-02-22 NOTE — Progress Notes (Signed)
Report called to [place] at [facility].

## 2014-02-22 NOTE — Progress Notes (Signed)
Notified MD of patient's headache, 5//10 and that patient request to take Protonix BID.  Will continue to monitor patient.

## 2014-02-22 NOTE — Progress Notes (Addendum)
[facility] PROGRESS NOTE  Robin Blackburn FYB:[id number] DOB: May 22, 1946 DOA: 02/19/2014 PCP: Purvis Kilts, MD    Code Status: Full code Family Communication: Discussed with patient Disposition Plan: Discharge the skilled nursing facility   Consultants:  None  Procedures:  2-D echocardiogram  Antibiotics:  None  HPI/Subjective: The patient is a little anxious about being discharged to the skilled nursing facility today. She is concerned that she might have another stroke.  Objective: Filed Vitals:   02/22/14 0505  BP: 144/76  Pulse: 103  Temp: 98.5 F (36.9 C)  Resp: 17    Intake/Output Summary (Last 24 hours) at 02/22/14 1257 Last data filed at 02/22/14 0900  Gross per 24 hour  Intake   2081 ml  Output      0 ml  Net   2081 ml   Filed Weights   02/19/14 1648 02/19/14 2124  Weight: 83.915 kg (185 lb) 90.7 kg (199 lb 15.3 oz)    Exam:   General:  Pleasant alert 68 year old woman sitting up in bed, in no acute distress.  Cardiovascular: S1, S2, with no murmurs rubs or gallops.  Respiratory: Clear to auscultation bilaterally.  Abdomen: Positive bowel sounds, soft, nontender, nondistended.  Musculoskeletal: Right foot with bandage in place, not taken off. She was able to wiggle the toes on both of her feet. Mild generalized edema over the right foot and lower leg.  Neuropsychiatric: She is slightly anxious, but her speech is clear and she is cooperative. Decrease in sensation on the right side of her face, otherwise, cranial nerves appear to be intact.   Data Reviewed: Basic Metabolic Panel:  Recent Labs Lab 02/19/14 1741 02/19/14 1800 02/19/14 1805 02/20/14 1630 02/21/14 0517 02/22/14 0512  NA 132*  --  131* 136* 140 140  K 3.1*  --  3.0* 3.6* 3.4* 3.5*  CL 91*  --  96 98 101 100  CO2 23  --   --  25 26 28   GLUCOSE 124*  --  128* 134* 121* 114*  BUN 5*  --  <3* 5* 4* 5*  CREATININE 0.51  --  0.50 0.58 0.59 0.59  CALCIUM 9.1  --    --  8.7 8.5 8.5  MG  --  1.8  --  2.1  --   --    Liver Function Tests:  Recent Labs Lab 02/19/14 1741  AST 16  ALT 15  ALKPHOS 84  BILITOT 0.6  PROT 7.6  ALBUMIN 3.8   No results found for this basename: LIPASE, AMYLASE,  in the last 168 hours No results found for this basename: AMMONIA,  in the last 168 hours CBC:  Recent Labs Lab 02/19/14 1741 02/19/14 1805 02/20/14 1630 02/21/14 0517 02/22/14 0512  WBC 23.0*  --  14.6* 12.0* 15.4*  NEUTROABS 18.7*  --   --   --  10.9*  HGB 14.5 16.0* 12.9 12.0 12.1  HCT 41.4 47.0* 37.2 36.1 37.1  MCV 93.5  --  94.7 96.5 99.2  PLT 481*  --  417* 393 408*   Cardiac Enzymes: No results found for this basename: CKTOTAL, CKMB, CKMBINDEX, TROPONINI,  in the last 168 hours BNP (last 3 results) No results found for this basename: PROBNP,  in the last 8760 hours CBG:  Recent Labs Lab 02/22/14 0245  GLUCAP 96    Recent Results (from the past 240 hour(s))  CLOSTRIDIUM DIFFICILE BY PCR     Status: None   Collection Time  02/22/14 10:20 AM      Result Value Ref Range Status   C difficile by pcr NEGATIVE  NEGATIVE Final     Studies: Dg Ankle Complete Left  2014-03-16   CLINICAL DATA:  Left ankle pain.  No known injury.  EXAM: LEFT ANKLE COMPLETE - 3+ VIEW  COMPARISON:  Previous left foot radiographs, CT and MR examinations.  FINDINGS: Mild diffuse soft tissue swelling. Diffuse osteopenia. Small to moderate-sized inferior calcaneal spur. There is some flattening of the normal plantar arch.  IMPRESSION: Mild diffuse soft tissue swelling, inferior calcaneal spur and pes planus.   Electronically Signed   By: Enrique Sack M.D.   On: 03-16-2014 17:46   US Carotid Bilateral  02/21/2014   CLINICAL DATA:  Stroke  EXAM: BILATERAL CAROTID DUPLEX ULTRASOUND  TECHNIQUE: Pearline Cables scale imaging, color Doppler and duplex ultrasound were performed of bilateral carotid and vertebral arteries in the neck.  COMPARISON:  Brain MRA and MRI 03/16/14   FINDINGS: Criteria: Quantification of carotid stenosis is based on velocity parameters that correlate the residual internal carotid diameter with NASCET-based stenosis levels, using the diameter of the distal internal carotid lumen as the denominator for stenosis measurement.  The following velocity measurements were obtained:  RIGHT  ICA:  65/23 cm/sec  CCA:  644/03 cm/sec  SYSTOLIC ICA/CCA RATIO:  0.5  DIASTOLIC ICA/CCA RATIO:  0.9  ECA:  60 cm/sec  LEFT  ICA:  54/13 cm/sec  CCA:  47/42 cm/sec  SYSTOLIC ICA/CCA RATIO:  0.8  DIASTOLIC ICA/CCA RATIO:  1.0  ECA:  54 cm/sec  RIGHT CAROTID ARTERY: Tortuous common carotid artery. No significant atherosclerotic plaque or evidence of stenosis.  RIGHT VERTEBRAL ARTERY:  Patent with antegrade flow.  LEFT CAROTID ARTERY: Mildly tortuous anatomy. No significant atherosclerotic plaque or evidence of stenosis.  LEFT VERTEBRAL ARTERY:  Patent with normal antegrade flow.  IMPRESSION: 1. No significant atherosclerotic plaque or evidence of stenosis. 2. Vertebral arteries are patent with antegrade flow. 3. Tortuous vascular anatomy. Signed,  Criselda Peaches, MD  [facility]  [facility]   Electronically Signed   By: Jacqulynn Cadet M.D.   On: 02/21/2014 07:38   US Venous Img Lower Bilateral  02/21/2014   CLINICAL DATA:  Bilateral lower extremity pain and swelling  EXAM: BILATERAL LOWER EXTREMITY VENOUS DOPPLER ULTRASOUND  TECHNIQUE: Gray-scale sonography with graded compression, as well as color Doppler and duplex ultrasound were performed to evaluate the lower extremity deep venous systems from the level of the common femoral vein and including the common femoral, femoral, profunda femoral, popliteal and calf veins including the posterior tibial, peroneal and gastrocnemius veins when visible. The superficial great saphenous vein was also interrogated. Spectral Doppler was utilized to evaluate flow at rest and with distal  augmentation maneuvers in the common femoral, femoral and popliteal veins.  COMPARISON:  Prior left lower extremity duplex venous ultrasound 02/24/2008  FINDINGS: RIGHT LOWER EXTREMITY  Common Femoral Vein: No evidence of thrombus. Normal compressibility, respiratory phasicity and response to augmentation.  Saphenofemoral Junction: No evidence of thrombus. Normal compressibility and flow on color Doppler imaging.  Profunda Femoral Vein: No evidence of thrombus. Normal compressibility and flow on color Doppler imaging.  Femoral Vein: No evidence of thrombus. Normal compressibility, respiratory phasicity and response to augmentation.  Popliteal Vein: No evidence of thrombus. Normal compressibility, respiratory phasicity and response to augmentation.  Calf Veins: No evidence of thrombus. Normal compressibility and flow on color Doppler imaging.  Superficial Great Saphenous  Vein: No evidence of thrombus. Normal compressibility and flow on color Doppler imaging.  Venous Reflux:  None.  Other Findings:  None.  LEFT LOWER EXTREMITY  Common Femoral Vein: No evidence of thrombus. Normal compressibility, respiratory phasicity and response to augmentation.  Saphenofemoral Junction: No evidence of thrombus. Normal compressibility and flow on color Doppler imaging.  Profunda Femoral Vein: No evidence of thrombus. Normal compressibility and flow on color Doppler imaging.  Femoral Vein: No evidence of thrombus. Normal compressibility, respiratory phasicity and response to augmentation.  Popliteal Vein: No evidence of thrombus. Normal compressibility, respiratory phasicity and response to augmentation.  Calf Veins: No evidence of thrombus. Normal compressibility and flow on color Doppler imaging.  Superficial Great Saphenous Vein: No evidence of thrombus. Normal compressibility and flow on color Doppler imaging.  Venous Reflux:  None.  Other Findings:  None.  IMPRESSION: No evidence of deep venous thrombosis.   Electronically  Signed   By: Jacqulynn Cadet M.D.   On: 02/21/2014 07:47   Dg Knee Complete 4 Views Left  02/20/2014   CLINICAL DATA:  Left knee pain.  No known injury.  EXAM: LEFT KNEE - COMPLETE 4+ VIEW  COMPARISON:  Left knee MR dated 08/05/2005.  FINDINGS: Moderate medial and mild lateral joint space narrowing. Minimal medial spur formation. No effusion. Diffuse osteopenia.  IMPRESSION: Mild degenerative changes.   Electronically Signed   By: Enrique Sack M.D.   On: 02/20/2014 17:44   Dg Swallowing Func-speech Pathology  02/21/2014   Ephraim Hamburger, CCC-SLP     02/21/2014  5:04 PM Objective Swallowing Evaluation: Modified Barium Swallowing Study   Patient Details  Name: OLIVETTE BECKMANN MRN: [id number] Date of Birth: Aug 03, 1945  Today's Date: 02/21/2014 Time: 9628-3662 SLP Time Calculation (min): 46 min  Past Medical History:  Past Medical History  Diagnosis Date  . Neuropathy   . Hypertension   . Anxiety   . Depression   . Acid reflux   . Rheumatoid arthritis   . Fibromyalgia   . Stroke 02/17/14   Past Surgical History:  Past Surgical History  Procedure Laterality Date  . Back surgery    . Knee surgery    . Ankle reconstruction    . Abdominal hysterectomy    . Appendectomy    . Cholecystectomy     HPI:  MAICIE VANDERLOOP is a 68 y.o. female with a history of RA, HTN,  Neuropathy, Fibromyalgia and Anxiety/Depression who presents to  the [facility] ED with complaints increased pain of her right  foot and ankleafter falling and twisting her right ankle twice  during the past 2 days.  She also reports that she has not felt  right , and has had increased Dizziness and thought it was her  inner ear for the past 2 days, and she also noticed that she had  numbness and weakness of the right side of her face and into her  right arm since yesterday.   She denies having any headache or  syncope or chest pain.  She was evaluated in the ED and was found  to have a Sub-Acute Left Thalamic infarct  And X-rays of her  Right ankle and foot were  negative for fractures.She was referred  for medical admission. SLP asked to complete BSE due to pt  failing RN swallow screen.      Assessment / Plan / Recommendation Clinical Impression  Dysphagia Diagnosis: Within Functional Limits Clinical impression: Ms. Rogel was seen upright in hausted chair  for MBSS due to pt reporting globus sensation and fear of  choking. She required frequent reassurance before, during, and  after the procedure. She requested Xanax before going down to  radiology and took it without incident crushed in sherbet.  Overall, objective study shows only mild decreased tongue base  retraction which resulted in mild/trace puree residuals in  valleculae post swallow. The residual cleared with liquid wash.  When pt was cued to "swallow hard" for the initial swallow, this  resulted in no pharyngeal residuals. Mild residuals noted at CP  which clear with repeat swallow. Pt was sensate to the trace  residual and in fact seemed overly sensitive to its presence. She  was reassured that she would be safe consuming a regular diet  with thin liquids; implement effortful swallow for solids and  alternate solids/liquids prn. No further SLP follow up indicated  in acute setting; pt may benefit from SLP evaluation in SNF to  determine any ongoing needs.    Treatment Recommendation  Defer treatment plan to SLP at (Comment)    Diet Recommendation Regular;Thin liquid   Liquid Administration via: Cup;Straw Medication Administration: Whole meds with liquid Supervision: Intermittent supervision to cue for compensatory  strategies;Patient able to self feed Compensations: Follow solids with liquid;Effortful swallow Postural Changes and/or Swallow Maneuvers: Seated upright 90  degrees;Upright 30-60 min after meal    Other  Recommendations Oral Care Recommendations: Oral care  BID;Patient independent with oral care Other Recommendations: Clarify dietary restrictions   Follow Up Recommendations  Skilled Nursing facility     Frequency and Duration  N/A     Pertinent Vitals/Pain VSS    SLP Swallow Goals     General Date of Onset: 02/19/14 HPI: PARASKEVI FUNEZ is a 68 y.o. female with a history of RA, HTN,  Neuropathy, Fibromyalgia and Anxiety/Depression who presents to  the [facility] ED with complaints increased pain of her right  foot and ankleafter falling and twisting her right ankle twice  during the past 2 days.  She also reports that she has not felt  right , and has had increased Dizziness and thought it was her  inner ear for the past 2 days, and she also noticed that she had  numbness and weakness of the right side of her face and into her  right arm since yesterday.   She denies having any headache or  syncope or chest pain.  She was evaluated in the ED and was found  to have a Sub-Acute Left Thalamic infarct  And X-rays of her  Right ankle and foot were negative for fractures.She was referred  for medical admission. SLP asked to complete BSE due to pt  failing RN swallow screen.  Type of Study: Modified Barium Swallowing Study Previous Swallow Assessment: BSE 02/20/2014 pt c/o residue with  puree; full liquids Diet Prior to this Study: Thin liquids Temperature Spikes Noted: No Respiratory Status: Room air History of Recent Intubation: No Behavior/Cognition: Alert;Pleasant mood;Cooperative Oral Cavity - Dentition: Adequate natural dentition Self-Feeding Abilities: Able to feed self Patient Positioning: Upright in chair Baseline Vocal Quality: Clear Volitional Cough: Strong Volitional Swallow: Able to elicit    Reason for Referral     Oral Phase Oral Preparation/Oral Phase Oral Phase: WFL   Pharyngeal Phase Pharyngeal Phase Pharyngeal Phase: Impaired Pharyngeal - Thin Pharyngeal - Thin Cup: Within functional limits Pharyngeal - Thin Straw: Within functional limits Pharyngeal - Solids Pharyngeal - Puree: Premature spillage to valleculae;Reduced  tongue base retraction;Pharyngeal residue -  cp segment;Pharyngeal  residue -  valleculae (very mild residual, no residual when pt  uses effortful swall) Pharyngeal - Regular: Within functional limits Pharyngeal - Pill: Within functional limits  Cervical Esophageal Phase    GO   Thank you,  Genene Churn, CCC-SLP [phone]  Cervical Esophageal Phase Cervical Esophageal Phase: [facility]         PORTER,DABNEY 02/21/2014, 5:04 PM     Scheduled Meds: . antiseptic oral rinse  7 mL Mouth Rinse BID  . aspirin  300 mg Rectal Daily   Or  . aspirin  325 mg Oral Daily  . enoxaparin (LOVENOX) injection  40 mg Subcutaneous Q24H  . hydrochlorothiazide  12.5 mg Oral Daily  . lidocaine  1 patch Transdermal Q24H  . nystatin cream   Topical BID  . pantoprazole  40 mg Oral Daily  . potassium chloride  40 mEq Oral Once  . potassium chloride  40 mEq Oral Daily  . simvastatin  20 mg Oral q1800   Continuous Infusions: . sodium chloride 75 mL/hr at 02/21/14 2100    Principal Problem:   CVA (cerebral infarction) Active Problems:   Right ankle sprain   Contusion of right foot   Essential hypertension   Neuropathy   Anxiety   Hyponatremia   Hypokalemia   Leukocytosis, unspecified   Rheumatoid arthritis   PLEASE SEE THE  DISCHARGE SUMMARY DICTATED BY DR. Roderic Palau ON 02/21/14. No changes to the management per Dr. Roderic Palau with exception of a few changes in the medications including the addition of Lomotil 1 tablet twice a day when necessary for diarrhea; normal saline nasal spray-1 spray in each nare when necessary; Ambien 5 mg each bedtime when necessary; and potassium chloride 20 mEq daily when necessary.   Her white blood cell count is still modestly elevated, but with no obvious source of infection. A followup CBC is recommended. She may need a referral to hematology if her CBC continues to increase without a definite source of infection.  The patient's C. difficile PCR was negative. She has a history of chronic loose stools. She was reassured about secondary prevention for stroke including  aspirin therapy, normalizing her blood pressures, treating her hyperlipidemia, exercise, and a healthy diet. She is stable and in improved condition. She we discharged to the [facility] for rehabilitation today.   Time spent: 25 minutes.    [facility] Pager [phone]. If 7PM-7AM, please contact night-coverage at [email or web address], password [facility] 02/22/2014, 12:57 PM  LOS: 3 days

## 2014-02-23 ENCOUNTER — Other Ambulatory Visit: Payer: Self-pay | Admitting: *Deleted

## 2014-02-23 MED ORDER — HYDROCODONE-ACETAMINOPHEN 5-325 MG PO TABS: 1.0000 | ORAL_TABLET | Freq: Four times a day (QID) | ORAL | Status: DC | PRN

## 2014-02-23 MED ORDER — ALPRAZOLAM 1 MG PO TABS: 1.0000 mg | ORAL_TABLET | Freq: Four times a day (QID) | ORAL | Status: DC | PRN

## 2014-02-23 NOTE — Telephone Encounter (Signed)
[facility] 

## 2014-02-24 ENCOUNTER — Non-Acute Institutional Stay (SKILLED_NURSING_FACILITY): Payer: Medicare Other | Admitting: Internal Medicine

## 2014-02-24 DIAGNOSIS — M069 Rheumatoid arthritis, unspecified: Secondary | ICD-10-CM

## 2014-02-24 DIAGNOSIS — S93409A Sprain of unspecified ligament of unspecified ankle, initial encounter: Secondary | ICD-10-CM

## 2014-02-24 DIAGNOSIS — M6281 Muscle weakness (generalized): Secondary | ICD-10-CM

## 2014-02-24 DIAGNOSIS — I10 Essential (primary) hypertension: Secondary | ICD-10-CM

## 2014-02-24 DIAGNOSIS — R531 Weakness: Secondary | ICD-10-CM

## 2014-02-24 DIAGNOSIS — E876 Hypokalemia: Secondary | ICD-10-CM

## 2014-02-24 NOTE — Progress Notes (Signed)
Patient ID: Robin Blackburn, female   DOB: 1945/06/22, 68 y.o.   MRN: [id number]   this is an acute visit.  Level of care skilled.  Facility D&C.  Chief complaint acute visit status post hospitalization for CVA with right-sided weakness.  History of present illness.  Patient is a pleasant 68 year old female with multiple medical problems who is complaining of initially pain in her right foot and ankle after twisting it-she also reported increased dizziness and numbness and weakness on the right side of her face and her right are.  She underwent an MRI which showed a 1 cm acute infarction of the left lateral thalamus-was recommended she take a daily aspirin-LDL was checked and found greater to be 100 so she was started on a statin.  Carotid Dopplers and echocardiogram were unremarkable hemoglobin A1c was 5.9.  Physical therapy recommended skilled nursing placement for rehabilitation.  Patient was also seen by speech therapy for swallowing and did well with a modified barium swallow-she is cleared for a regular diet with thin liquids.  In regards to right foot pain she had extensive contusions-there was no underlying fracture per x-ray-she was advised to keep it elevated ice and placed on compression dressing.  She also has significant leukocytosis with a white count rising to 23,000-no clear source of infection was determined-she was started on IV fluids and WBC count titrated down-it was thought this could be reactive.  Patient also has severe anxiety-she is on Xanax.  Currently her vital signs are stable   Previous medical history.  CVA.  Right ankle sprain.  Hypertension.  Neuropathy.  Anxiety.  Hyponatremia.  Hypokalemia.  Leukocytosis.  Rheumatoid arthritis .  Previous surgery.  Appendectomy-.  Cholecystectomy.  Hysterectomy.  Previous knee surgery.  Ankle reconstruction.  Back surgery medications.  Aspirin 325 mg daily.  Robaxin 500 mg every 6 hours  when necessary.  Zocor 20 mg daily.  Xanax 1 mg every 6 hours when necessary.  Vicodin 10/10/2023 one tab every 6 hours when necessary.  Bentyl 20 mg daily.  Hydrochlorothiazide 12.5 mg daily.  Plaquenil 200 mg daily.  Prevacid 30 mg daily.  Lidoderm patch one patch daily.  Zofran 8 mg every 8 hours when necessary.  Restasis eyedrops one drop both eyes twice a day.  Vitamin D-50,000 units q. Weekly.    Family history Father  a history of hypertension and TIAs-.   brother with a history of leukemia and hypertension  Maternal grandfather with a history of CVA  Social history.  No history of significant alcohol tobacco or illicit drug use  Review of systems.  In general no complaints of fever or chills.  Skin does not complaining of rash or itching.  Head ears eyes nose mouth and throat-does complain of some right eye discomfort-apparently this has been going on for a while she would like an ophthalmology consult is not complaining of acute visual changes.  Respiratory does complain of shortness of breath or cough.  Cardiac does not complaining of chest pain.  GI is not complaining of abdominal discomfort currently working nausea or vomiting.  Muscle skeletal does complain at times of some lower extremity edema bilaterally she is receiving Norco as well as Robaxin does have a history of the right ankle sprain.  Neurologic is not complaining of dizziness or headache or syncopal type feeling does complain of some numbness  right sided extremities.  Psych she does have a significant history of anxiety.  Physical exam.  Temperature is 98.4 pulse 85  respirations 20 blood pressure 133/79.  In general this is a pleasant somewhat obese middle-aged female in no distress sitting comfortably in her wheelchair.  Her skin is warm and dry.  Eyes sclera and conjunctiva are clear pupils appear reactive visual acuity appears intact I do not see any edema erythema of  either eye or orbital area.  Oropharynx clear mucous membranes moist.  Chest is clear to auscultation no labored breathing.  Heart is regular rate and rhythm without murmur gallop or rub she has what appears to be chronic lower extremity edema this is slightly more in the right versus the left.  Abdomen is obese soft does not appear to be tender there are positive bowel sounds.  Muscle skeletal she does have wrapping of her right ankle she has some mild edema this is more on the right versus the left again she does have a history of the twisted ankle She is able to move all extremities x4 however does appear to have some right-sided weakness most noticeable right upper extremity.  Neurologic--does appear to have some mild right-sided weakness slightly reduced grip strength on the right versus left-her speech is clear cranial nerves appear grossly intact  Psych she is alert and oriented x3 does appear to be somewhat anxious  Labs.  02/24/2014.  Sodium 136 potassium 3.3 BUN 5 creatinine 0.59.  WBC 10.3-hemoglobin 10.9-platelets 463.  02/19/2014-liver function tests within normal limits.  Assessment and plan.  #1-history of CVA with late effect . right-sided weakness-again she will need PT and OT-has been started on aspirin as well as a statin follow up with neurology as needed-at this point appears stable.  #2-history of right ankle sprain-she does have compression applied to encourage elevation and ice at this point appears to be at baseline.  #3 hypertension-we have minimal readings so far appear to be stable she is on hydrochlorothiazide potassium also need to be supplemented secondary to borderline low potassium-have started this updated basic metabolic panel on Monday, September 22.  #4-anxiety apparently this is fairly significant she is on Xanax when necessary at this point we'll monitor.  #5-history of right eye issues-she has been followed by ophthalmology will write for  an appointment with her ophthalmologist who has been following her.  #6 past history leukocytosis this appears to have resolved we'll update a CBC as well Monday, September 22--also will need to keep an eye on her hemoglobin.  #7-history of rheumatoid arthritis-she continues onPlaquenil--.  Of note for pain management again she does continue on Vicodin as well as Robaxin for associated muscle spasms-also has a Lidoderm patch  [id number]-of note greater than 35 minutes then assessing patient-reviewing her chart-addressing her concerns at bedside-and coordinating and formulating a plan of care for numerous diagnoses-of note greater than 50% of time spent coordinating plan of care  .

## 2014-02-26 ENCOUNTER — Encounter: Payer: Self-pay | Admitting: Internal Medicine

## 2014-02-26 DIAGNOSIS — R531 Weakness: Secondary | ICD-10-CM | POA: Insufficient documentation

## 2014-02-27 ENCOUNTER — Non-Acute Institutional Stay (SKILLED_NURSING_FACILITY): Payer: Medicare Other | Admitting: Internal Medicine

## 2014-02-27 ENCOUNTER — Inpatient Hospital Stay (HOSPITAL_COMMUNITY): Payer: Medicare Other | Attending: Internal Medicine

## 2014-02-27 DIAGNOSIS — S93409A Sprain of unspecified ligament of unspecified ankle, initial encounter: Secondary | ICD-10-CM

## 2014-02-27 DIAGNOSIS — M069 Rheumatoid arthritis, unspecified: Secondary | ICD-10-CM

## 2014-02-27 DIAGNOSIS — I6350 Cerebral infarction due to unspecified occlusion or stenosis of unspecified cerebral artery: Secondary | ICD-10-CM

## 2014-02-27 DIAGNOSIS — I639 Cerebral infarction, unspecified: Secondary | ICD-10-CM

## 2014-02-27 DIAGNOSIS — I6381 Other cerebral infarction due to occlusion or stenosis of small artery: Secondary | ICD-10-CM
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
## 2014-03-01 ENCOUNTER — Non-Acute Institutional Stay (SKILLED_NURSING_FACILITY): Payer: Medicare Other | Admitting: Internal Medicine

## 2014-03-01 DIAGNOSIS — S92309A Fracture of unspecified metatarsal bone(s), unspecified foot, initial encounter for closed fracture: Secondary | ICD-10-CM

## 2014-03-01 DIAGNOSIS — I69959 Hemiplegia and hemiparesis following unspecified cerebrovascular disease affecting unspecified side: Secondary | ICD-10-CM

## 2014-03-01 DIAGNOSIS — S92351A Displaced fracture of fifth metatarsal bone, right foot, initial encounter for closed fracture: Secondary | ICD-10-CM

## 2014-03-06 DIAGNOSIS — S92351A Displaced fracture of fifth metatarsal bone, right foot, initial encounter for closed fracture: Secondary | ICD-10-CM | POA: Insufficient documentation

## 2014-03-06 NOTE — Progress Notes (Addendum)
Patient ID: Robin Blackburn, female   DOB: Mar 31, 1946, 68 y.o.   MRN: [id number]               PROGRESS NOTE  DATE:  03/01/2014    FACILITY: [facility]    LEVEL OF CARE:   SNF   Acute Visit   CHIEF COMPLAINT:  Follow up right foot pain.    HISTORY OF PRESENT ILLNESS:  This is a patient who came to [place] after suffering a fall with a twisting motion in the right foot and ankle.  She was discovered to have a left thalamic CVA.  Although my original note here suggests that she had x-rays of the right foot and ankle, it appears that there are actual x-rays of the left ankle.    When I did her original H&P, she had considerable bruising in the right leg and complaints of pain in the right lateral foot and ankle.  I have repeated her x-rays and these show a fracture in the base of the right fifth metatarsal.  This is nondisplaced.  We have arranged for orthopedic consultation on this area.    Other issues include:  The patient is concerned that she might have a blood clot in the right leg, although I note that she had a negative duplex ultrasound on 02/21/2014.  Her right leg swelling is considerably better than when I saw her the other day.  The bruising, which is really quite extensive, appears to be fading.    PHYSICAL EXAMINATION:   MUSCULOSKELETAL:   EXTREMITIES:   RIGHT LOWER EXTREMITY:   Right leg:  Again, the edema and bruising here is a lot better.  There is probably a sprain of the collateral ligament on the right.  She is very tender at the base of the right fifth metatarsal base.    ASSESSMENT/PLAN:  Fracture of the right fifth metatarsal base.  We have arranged for orthopedic consultation.  I am keeping her non-weightbearing until seen by Orthopedics.    Swelling of the right leg.  I do not think there is an issue here.  I think this is a resolving hematoma/perhaps sprain around the right ankle.    One centimeter acute infarction in the left thalamus.  She appears to be  making a good recovery from this.

## 2014-03-07 ENCOUNTER — Other Ambulatory Visit: Payer: Self-pay | Admitting: *Deleted

## 2014-03-07 MED ORDER — HYDROCODONE-ACETAMINOPHEN 5-325 MG PO TABS: 1.0000 | ORAL_TABLET | Freq: Four times a day (QID) | ORAL | Status: DC | PRN

## 2014-03-07 NOTE — Telephone Encounter (Signed)
[facility] 

## 2014-03-11 ENCOUNTER — Non-Acute Institutional Stay (SKILLED_NURSING_FACILITY): Payer: Medicare Other | Admitting: Internal Medicine

## 2014-03-11 ENCOUNTER — Encounter: Payer: Self-pay | Admitting: Internal Medicine

## 2014-03-11 DIAGNOSIS — R0989 Other specified symptoms and signs involving the circulatory and respiratory systems: Secondary | ICD-10-CM

## 2014-03-11 DIAGNOSIS — M25562 Pain in left knee: Secondary | ICD-10-CM

## 2014-03-11 DIAGNOSIS — S92351D Displaced fracture of fifth metatarsal bone, right foot, subsequent encounter for fracture with routine healing: Secondary | ICD-10-CM

## 2014-03-11 DIAGNOSIS — Z8673 Personal history of transient ischemic attack (TIA), and cerebral infarction without residual deficits: Secondary | ICD-10-CM

## 2014-03-11 NOTE — Progress Notes (Signed)
Patient ID: [name], female   DOB: [birth year], 68 y.o.   MRN: [id number]   This is an acute visit.  Local care skilled.  Facility D&C.  Chief complaint-acute visit secondary to several patient concerns including vitamin D dose-aspirin dose-orthopedic issues.  History of present illness.  Patient is a pleasant 68 year old female who sustained a fall with a twisting motion in the right ankle and foot-  found to have also a left thalamic CVA ---  She was seen by neurology and started on routine aspirin-325 mg a day.  Patient feels that this is irritating her stomach somewhat and would like it reduced to 81 mg a day.  She also is on vitamin D Drisdol-she would like this switched to Os-Cal with vitamin D she said this was the apparent  intent before discharge and apparently the order just was not written-and we will do that.  She also was found to have a fracture of the right fifth metatarsal base--seen by orthopedics with orders for weightbearing as tolerated she does have a short boot  At times she has some left knee and ankle discomfort-she would like orders for left ankle brace as needed-also says she has benefited from bio freeze topicals to her left knee.  In regards to CVA with right-sided weakness she appears to be doing well with this is draining it appears a fairly significant amount of strength on her right side past has shown improvement since I saw her last time.  Family medical social history as been reviewed including progress note on 02/24/2014.  Medications have been reviewed per MAR.  Review of systems.  General no complaints of fever or chills.  Respiratory does not complaining of shortness of breath but does say sometimes she feels like she has some chest congestion.  Does not complaining of cough.  Cardiac does not complaining of any chest pain.  GI does not complaining of abdominal discomfort nausea or vomiting.  Muscle skeletal does have somewhat  diffuse complaints with significant orthostatic history including right foot fracture and twisting of her left ankle and osteoarthritis of left knee.--Says occasionally  feels like her legs were cramping  Neurologic does not complaining of dizziness or headache.  Psyche- a history of anxiety this appears to be relatively well controlled.  Physical exam.  Temperature is 97.9 pulse 65 respirations 20 blood pressure 122/61  In general this is a pleasant elderly female in no distress sitting comfortably in her wheelchair.  Her skin is warm and dry.  Chest is clear to auscultation there is no labored breathing.  Heart is regular rate and rhythm without murmur gallop or rub-her lower extremity edema and difficult to fully assess secondary to on right leg but appears to be relatively baseline.  Abdomen is obese soft nontender with positive bowel sounds  Muscle skeletal does move all extremities x4 has gained it appears significant strength in her right side extremities especially upper extremity grip strength is strong bilaterally.  She has a boot applied to her right foot.  And some mild edema to her left leg and foot.  I do not note any deformity other than arthritic to her left knee.  There is no erythema or warmth of significance to her left knee.  Neurologic as stated above her right-sided weakness appears to be improving.  Labs.  03/01/2014.  WBC 9.5 hemoglobin 12.0 platelets 630  Sodium 136 potassium 3.8 BUN 5 creatinine 0.56.  Assessment and plan.  #1-CVA late effect-this appears to be  improving she is getting stronger-she did have questions about the aspirin dose and will have followup with Dr. Adam Phenix would prefer 81 mg versus 325 mg secondary to stomach discomfort.  She does continue on a proton pump inhibitor.  #2 vitamin D issues as noted above will switch her to Os-Cal with vitamin D twice a day.  #3-chest congestion?-Did discuss possibly getting an x-ray but  patient states she does not really have any shortness of breath-Will monitor this and order spirometry 2 encourage deep breathing-monitor clinical status closely.  -Physical exam was benign patient was reassured by this.  #4-history of left knee discomfort will give bio freeze when necessary order.  #5 as history of left ankle discomfort will write for ankle brace when necessary she appears to be doing better in this regard.  #6-history of right foot fracture-this is followed by orthopedics she is wearing a short boot-.  #7-history of intermittent cramping of her legs will order a metabolic panel to check her electrolytes. Although apparently this is not really a new issue  [id number]   [phone]    .

## 2014-03-20 ENCOUNTER — Other Ambulatory Visit: Payer: Self-pay | Admitting: *Deleted

## 2014-03-20 NOTE — Patient Instructions (Signed)
Sent note to [facility] for medication refill advice.  SRice, [place]

## 2014-03-22 ENCOUNTER — Other Ambulatory Visit: Payer: Self-pay | Admitting: *Deleted

## 2014-03-22 MED ORDER — HYDROCODONE-ACETAMINOPHEN 5-325 MG PO TABS: ORAL_TABLET | ORAL | Status: DC

## 2014-03-22 NOTE — Telephone Encounter (Signed)
[facility] 

## 2014-03-23 ENCOUNTER — Non-Acute Institutional Stay (SKILLED_NURSING_FACILITY): Payer: Medicare Other | Admitting: Internal Medicine

## 2014-03-23 DIAGNOSIS — I639 Cerebral infarction, unspecified: Secondary | ICD-10-CM

## 2014-03-23 DIAGNOSIS — I10 Essential (primary) hypertension: Secondary | ICD-10-CM

## 2014-03-23 DIAGNOSIS — S93401D Sprain of unspecified ligament of right ankle, subsequent encounter: Secondary | ICD-10-CM

## 2014-03-23 DIAGNOSIS — F419 Anxiety disorder, unspecified: Secondary | ICD-10-CM

## 2014-03-23 DIAGNOSIS — M6289 Other specified disorders of muscle: Secondary | ICD-10-CM

## 2014-03-23 DIAGNOSIS — R531 Weakness: Secondary | ICD-10-CM

## 2014-03-23 DIAGNOSIS — S92351D Displaced fracture of fifth metatarsal bone, right foot, subsequent encounter for fracture with routine healing: Secondary | ICD-10-CM

## 2014-03-23 NOTE — Progress Notes (Signed)
Patient ID: Robin Blackburn, female   DOB: 01-08-1946, 68 y.o.   MRN: [id number]   This is a discharge note.  Level care skilled.  Facility [facility] .  Chief complaint- Discharge note .  History of present illness .  Patient is a pleasant 68 year old female who sustained a fall with a twisting motion in the right ankle and foot- found to have also a left thalamic CVA ---  She was seen by neurology and started on routine aspirin-325 mg a day--.  .   .  She also was found to have a fracture of the right fifth metatarsal base--seen by orthopedics with orders for weightbearing as tolerated she does have a short boot   .  In regards to CVA with right-sided weakness she appears to be doing well with this is draining it appears a fairly significant amount of strength on her right side past has shown improvement   She will be going home by herself she is reasonably independent-would benefit from continued PT and OT for strengthening.  During her stay her Ambien also was increased to 10 mg each bedtime apparently she has tolerated this well.  She is receiving Norco as needed for pain which apparently is giving her relief.  She's also on Xanax every 6 hours as needed for anxiety she does have a significant history of anxiety which appears to have been relatively well controlled here .  Family medical social history as been reviewed including progress note on 02/24/2014 .  Medications have been reviewed per MAR .  Review of systems.  General no complaints of fever or chills.  Respiratory does not complaining of shortness of breath or cough Does not complaining of cough.  Cardiac does not complaining of any chest pain.  GI does not complaining of abdominal discomfort nausea or vomiting.  Muscle skeletal does have somewhat diffuse complaints with significant orthostatic history including right foot fracture and twisting of her left ankle and osteoarthritis of left knee.-- Norco appears to be  relatively well--still complains of pain at times  Neurologic does not complaining of dizziness or headache.  Psyche- a history of anxiety this appears to be relatively well controlled .  Physical exam.  T- 97.8 pulse 67 respirations 20 blood pressure 121/78 In general this is a pleasant elderly female in no distress s.  Her skin is warm and dry.  Chest is clear to auscultation there is no labored breathing.  Heart is regular rate and rhythm without murmur gallop or rub-her lower extremity edema and difficult to fully assess secondary to on right leg but appears to be fairly minimal.  Abdomen is obese soft nontender with positive bowel sounds  Muscle skeletal does move all extremities x4 has gained it appears significant strength in her right side extremities especially upper extremity grip strength is strong bilaterally.  She has a boot applied to her right foot. .  I do not note any deformity other than arthritic to her left knee.  [name] Kitchen  Neurologic as stated above her right-sided weakness appears to be improving. Psych she is alert and oriented x3 pleasant and appropriate   Labs.  03/13/2014.  WBC 9.1 hemoglobin 11.3 platelets 459.  Sodium 131 potassium 4.3 BUN 7 creatinine 0.77.  02/22/2014.  Liver function tests within normal limits   03/01/2014.  WBC 9.5 hemoglobin 12.0 platelets 630  Sodium 136 potassium 3.8 BUN 5 creatinine 0.56  .  Assessment and plan.   #1-CVA late effect-this appears to be improving  she is getting stronger- -- is on aspirin 325 mg a day--also on a statin  #2-history twisting right foot and ankle-at this point appears to be stable still complains at times of pain but discuss with patient hesitant to increase her pain medication before discharge and she  understood   .  #3-history of right foot fracture-this is followed by orthopedics she is wearing a short boot appears to be doing relatively well would benefit from continued PT and  OT.  #4-hypertension-this appears relatively well controlled on hydrochlorothiazide as well as potassium supplementation   #5 as history of anxiety-at this point appears stable she is receiving Xanax every 6 hours when necessary-.  Again she will be going home she is fairly independent she does have strong friends support it appears that she regards to pain she is comfortable with continuing with the [facility] it for now will need followup by her primary care provider.  Also will update lab work including a CBC a metabolic panel before discharge  -.    .    .   CPT- [id number]-of note greater than 30 minutes spent on this discharge summary

## 2014-03-24 ENCOUNTER — Encounter: Payer: Self-pay | Admitting: Internal Medicine

## 2014-03-24 DIAGNOSIS — I639 Cerebral infarction, unspecified: Secondary | ICD-10-CM | POA: Insufficient documentation

## 2014-05-24 ENCOUNTER — Encounter (HOSPITAL_COMMUNITY): Payer: Self-pay | Admitting: Emergency Medicine

## 2014-05-24 ENCOUNTER — Emergency Department (HOSPITAL_COMMUNITY): Payer: Medicare Other

## 2014-05-24 ENCOUNTER — Inpatient Hospital Stay (HOSPITAL_COMMUNITY)
Admission: EM | Admit: 2014-05-24 | Discharge: 2014-05-26 | DRG: 563 | Disposition: A | Discharge status: Skilled Nursing Facility | Payer: Medicare Other | Attending: Internal Medicine | Admitting: Internal Medicine

## 2014-05-24 DIAGNOSIS — M160 Bilateral primary osteoarthritis of hip: Secondary | ICD-10-CM | POA: Diagnosis present

## 2014-05-24 DIAGNOSIS — E871 Hypo-osmolality and hyponatremia: Secondary | ICD-10-CM | POA: Diagnosis present

## 2014-05-24 DIAGNOSIS — E876 Hypokalemia: Secondary | ICD-10-CM | POA: Diagnosis present

## 2014-05-24 DIAGNOSIS — Y92099 Unspecified place in other non-institutional residence as the place of occurrence of the external cause: Secondary | ICD-10-CM | POA: Diagnosis not present

## 2014-05-24 DIAGNOSIS — T1490 Injury, unspecified: Secondary | ICD-10-CM | POA: Diagnosis not present

## 2014-05-24 DIAGNOSIS — K219 Gastro-esophageal reflux disease without esophagitis: Secondary | ICD-10-CM | POA: Diagnosis present

## 2014-05-24 DIAGNOSIS — S92353A Displaced fracture of fifth metatarsal bone, unspecified foot, initial encounter for closed fracture: Secondary | ICD-10-CM

## 2014-05-24 DIAGNOSIS — Z8673 Personal history of transient ischemic attack (TIA), and cerebral infarction without residual deficits: Secondary | ICD-10-CM

## 2014-05-24 DIAGNOSIS — G8929 Other chronic pain: Secondary | ICD-10-CM | POA: Diagnosis present

## 2014-05-24 DIAGNOSIS — T1490XA Injury, unspecified, initial encounter: Secondary | ICD-10-CM

## 2014-05-24 DIAGNOSIS — W06XXXA Fall from bed, initial encounter: Secondary | ICD-10-CM | POA: Diagnosis present

## 2014-05-24 DIAGNOSIS — M179 Osteoarthritis of knee, unspecified: Secondary | ICD-10-CM | POA: Diagnosis present

## 2014-05-24 DIAGNOSIS — S92352A Displaced fracture of fifth metatarsal bone, left foot, initial encounter for closed fracture: Secondary | ICD-10-CM | POA: Diagnosis not present

## 2014-05-24 DIAGNOSIS — D72829 Elevated white blood cell count, unspecified: Secondary | ICD-10-CM | POA: Diagnosis present

## 2014-05-24 DIAGNOSIS — M7730 Calcaneal spur, unspecified foot: Secondary | ICD-10-CM | POA: Diagnosis present

## 2014-05-24 DIAGNOSIS — Y939 Activity, unspecified: Secondary | ICD-10-CM

## 2014-05-24 DIAGNOSIS — F419 Anxiety disorder, unspecified: Secondary | ICD-10-CM | POA: Diagnosis present

## 2014-05-24 DIAGNOSIS — M069 Rheumatoid arthritis, unspecified: Secondary | ICD-10-CM | POA: Diagnosis present

## 2014-05-24 DIAGNOSIS — F329 Major depressive disorder, single episode, unspecified: Secondary | ICD-10-CM | POA: Diagnosis present

## 2014-05-24 DIAGNOSIS — E785 Hyperlipidemia, unspecified: Secondary | ICD-10-CM

## 2014-05-24 DIAGNOSIS — I517 Cardiomegaly: Secondary | ICD-10-CM | POA: Diagnosis present

## 2014-05-24 DIAGNOSIS — Z823 Family history of stroke: Secondary | ICD-10-CM

## 2014-05-24 DIAGNOSIS — R5381 Other malaise: Secondary | ICD-10-CM

## 2014-05-24 DIAGNOSIS — W19XXXA Unspecified fall, initial encounter: Secondary | ICD-10-CM | POA: Diagnosis present

## 2014-05-24 DIAGNOSIS — I10 Essential (primary) hypertension: Secondary | ICD-10-CM | POA: Diagnosis present

## 2014-05-24 DIAGNOSIS — M797 Fibromyalgia: Secondary | ICD-10-CM | POA: Diagnosis present

## 2014-05-24 DIAGNOSIS — S92301A Fracture of unspecified metatarsal bone(s), right foot, initial encounter for closed fracture: Secondary | ICD-10-CM

## 2014-05-24 DIAGNOSIS — G629 Polyneuropathy, unspecified: Secondary | ICD-10-CM | POA: Diagnosis present

## 2014-05-24 LAB — COMPREHENSIVE METABOLIC PANEL
ALT: 17 U/L (ref 0–35)
AST: 17 U/L (ref 0–37)
Albumin: 3.3 g/dL — ABNORMAL LOW (ref 3.5–5.2)
Alkaline Phosphatase: 90 U/L (ref 39–117)
Anion gap: 13 (ref 5–15)
BILIRUBIN TOTAL: 0.8 mg/dL (ref 0.3–1.2)
BUN: 6 mg/dL (ref 6–23)
CHLORIDE: 85 meq/L — AB (ref 96–112)
CO2: 31 meq/L (ref 19–32)
CREATININE: 0.76 mg/dL (ref 0.50–1.10)
Calcium: 9.1 mg/dL (ref 8.4–10.5)
GFR calc Af Amer: >90 mL/min (ref >90)
GFR, EST NON AFRICAN AMERICAN: 85 mL/min — AB (ref >90)
Glucose, Bld: 122 mg/dL — ABNORMAL HIGH (ref 70–99)
Potassium: 3.3 mEq/L — ABNORMAL LOW (ref 3.7–5.3)
Sodium: 129 mEq/L — ABNORMAL LOW (ref 137–147)
Total Protein: 7.1 g/dL (ref 6.0–8.3)

## 2014-05-24 LAB — URINALYSIS, ROUTINE W REFLEX MICROSCOPIC
BILIRUBIN URINE: NEGATIVE
Glucose, UA: NEGATIVE mg/dL
Hgb urine dipstick: NEGATIVE
Ketones, ur: NEGATIVE mg/dL
Leukocytes, UA: NEGATIVE
NITRITE: NEGATIVE
PROTEIN: NEGATIVE mg/dL
Specific Gravity, Urine: 1.01 (ref 1.005–1.030)
UROBILINOGEN UA: 0.2 mg/dL (ref 0.0–1.0)
pH: 5.5 (ref 5.0–8.0)

## 2014-05-24 LAB — CK: Total CK: 253 U/L — ABNORMAL HIGH (ref 7–177)

## 2014-05-24 LAB — CBC WITH DIFFERENTIAL/PLATELET
BASOS ABS: 0 10*3/uL (ref 0.0–0.1)
BASOS PCT: 0 % (ref 0–1)
EOS ABS: 0 10*3/uL (ref 0.0–0.7)
EOS PCT: 0 % (ref 0–5)
HCT: 39.5 % (ref 36.0–46.0)
Hemoglobin: 13.3 g/dL (ref 12.0–15.0)
Lymphocytes Relative: 16 % (ref 12–46)
Lymphs Abs: 2.1 10*3/uL (ref 0.7–4.0)
MCH: 31.1 pg (ref 26.0–34.0)
MCHC: 33.7 g/dL (ref 30.0–36.0)
MCV: 92.3 fL (ref 78.0–100.0)
Monocytes Absolute: 1.2 10*3/uL — ABNORMAL HIGH (ref 0.1–1.0)
Monocytes Relative: 9 % (ref 3–12)
Neutro Abs: 9.8 10*3/uL — ABNORMAL HIGH (ref 1.7–7.7)
Neutrophils Relative %: 75 % (ref 43–77)
Platelets: 399 10*3/uL (ref 150–400)
RBC: 4.28 MIL/uL (ref 3.87–5.11)
RDW: 12.5 % (ref 11.5–15.5)
WBC: 13.1 10*3/uL — ABNORMAL HIGH (ref 4.0–10.5)

## 2014-05-24 LAB — I-STAT CG4 LACTIC ACID, ED: LACTIC ACID, VENOUS: 1.2 mmol/L (ref 0.5–2.2)

## 2014-05-24 MED ORDER — ONDANSETRON HCL 4 MG/2ML IJ SOLN
4.0000 mg | Freq: Once | INTRAMUSCULAR | Status: DC
  Filled 2014-05-24: qty 2

## 2014-05-24 MED ORDER — HYDROCHLOROTHIAZIDE 25 MG PO TABS
25.0000 mg | ORAL_TABLET | Freq: Every day | ORAL | Status: DC
  Administered 2014-05-25 – 2014-05-26 (×2): 25 mg via ORAL
  Filled 2014-05-24 (×2): qty 1

## 2014-05-24 MED ORDER — ACETAMINOPHEN 650 MG RE SUPP: 650.0000 mg | Freq: Four times a day (QID) | RECTAL | Status: DC | PRN

## 2014-05-24 MED ORDER — LIDOCAINE 5 % EX PTCH
1.0000 | MEDICATED_PATCH | CUTANEOUS | Status: DC
  Administered 2014-05-24 – 2014-05-25 (×2): 1 via TRANSDERMAL
  Filled 2014-05-24 (×5): qty 1

## 2014-05-24 MED ORDER — CYCLOSPORINE 0.05 % OP EMUL
OPHTHALMIC | Status: AC
  Filled 2014-05-24: qty 1

## 2014-05-24 MED ORDER — POLYETHYLENE GLYCOL 3350 17 G PO PACK: 17.0000 g | PACK | Freq: Every day | ORAL | Status: DC | PRN

## 2014-05-24 MED ORDER — SENNA 8.6 MG PO TABS
1.0000 | ORAL_TABLET | Freq: Two times a day (BID) | ORAL | Status: DC
  Administered 2014-05-24 – 2014-05-25 (×2): 8.6 mg via ORAL
  Filled 2014-05-24 (×4): qty 1

## 2014-05-24 MED ORDER — OXYCODONE HCL 5 MG PO TABS
5.0000 mg | ORAL_TABLET | ORAL | Status: DC | PRN
  Administered 2014-05-24: 5 mg via ORAL
  Filled 2014-05-24: qty 1

## 2014-05-24 MED ORDER — PAROXETINE HCL 20 MG PO TABS
10.0000 mg | ORAL_TABLET | Freq: Every day | ORAL | Status: DC
  Administered 2014-05-25 – 2014-05-26 (×2): 10 mg via ORAL
  Filled 2014-05-24 (×4): qty 1

## 2014-05-24 MED ORDER — POTASSIUM CHLORIDE CRYS ER 20 MEQ PO TBCR
20.0000 meq | EXTENDED_RELEASE_TABLET | Freq: Every day | ORAL | Status: DC
  Administered 2014-05-25 – 2014-05-26 (×2): 20 meq via ORAL
  Filled 2014-05-24 (×2): qty 1

## 2014-05-24 MED ORDER — SODIUM CHLORIDE 0.9 % IV SOLN: INTRAVENOUS | Status: AC

## 2014-05-24 MED ORDER — CYCLOSPORINE 0.05 % OP EMUL
1.0000 [drp] | Freq: Two times a day (BID) | OPHTHALMIC | Status: DC
  Administered 2014-05-24 – 2014-05-26 (×4): 1 [drp] via OPHTHALMIC
  Filled 2014-05-24 (×10): qty 1

## 2014-05-24 MED ORDER — ALPRAZOLAM 1 MG PO TABS
1.0000 mg | ORAL_TABLET | Freq: Four times a day (QID) | ORAL | Status: DC | PRN
  Administered 2014-05-24: 1 mg via ORAL
  Filled 2014-05-24: qty 1

## 2014-05-24 MED ORDER — PRAVASTATIN SODIUM 10 MG PO TABS
10.0000 mg | ORAL_TABLET | Freq: Every day | ORAL | Status: DC
  Administered 2014-05-24 – 2014-05-25 (×2): 10 mg via ORAL
  Filled 2014-05-24 (×2): qty 1

## 2014-05-24 MED ORDER — ASPIRIN 325 MG PO TABS: 325.0000 mg | ORAL_TABLET | Freq: Every day | ORAL | Status: DC

## 2014-05-24 MED ORDER — METHOCARBAMOL 500 MG PO TABS
500.0000 mg | ORAL_TABLET | Freq: Four times a day (QID) | ORAL | Status: DC | PRN
  Administered 2014-05-25: 500 mg via ORAL
  Filled 2014-05-24 (×2): qty 1

## 2014-05-24 MED ORDER — SODIUM CHLORIDE 0.9 % IV BOLUS (SEPSIS)
1000.0000 mL | Freq: Once | INTRAVENOUS | Status: AC
  Administered 2014-05-24: 1000 mL via INTRAVENOUS

## 2014-05-24 MED ORDER — ACETAMINOPHEN 325 MG PO TABS: 650.0000 mg | ORAL_TABLET | Freq: Four times a day (QID) | ORAL | Status: DC | PRN

## 2014-05-24 MED ORDER — ZOLPIDEM TARTRATE 5 MG PO TABS
5.0000 mg | ORAL_TABLET | Freq: Every evening | ORAL | Status: DC | PRN
  Administered 2014-05-24 – 2014-05-25 (×2): 5 mg via ORAL
  Filled 2014-05-24 (×2): qty 1

## 2014-05-24 MED ORDER — LIDOCAINE 5 % EX PTCH
MEDICATED_PATCH | CUTANEOUS | Status: AC
  Filled 2014-05-24: qty 1

## 2014-05-24 MED ORDER — SODIUM CHLORIDE 0.9 % IV SOLN
INTRAVENOUS | Status: DC
  Administered 2014-05-24: 18:00:00 via INTRAVENOUS

## 2014-05-24 MED ORDER — ONDANSETRON HCL 4 MG/2ML IJ SOLN
4.0000 mg | Freq: Four times a day (QID) | INTRAMUSCULAR | Status: DC | PRN
  Administered 2014-05-25: 4 mg via INTRAVENOUS
  Filled 2014-05-24 (×2): qty 2

## 2014-05-24 MED ORDER — ONDANSETRON HCL 4 MG/2ML IJ SOLN
4.0000 mg | Freq: Once | INTRAMUSCULAR | Status: AC
  Administered 2014-05-24: 4 mg via INTRAVENOUS

## 2014-05-24 MED ORDER — HEPARIN SODIUM (PORCINE) 5000 UNIT/ML IJ SOLN
5000.0000 [IU] | Freq: Three times a day (TID) | INTRAMUSCULAR | Status: DC
  Administered 2014-05-24 – 2014-05-26 (×5): 5000 [IU] via SUBCUTANEOUS
  Filled 2014-05-24 (×5): qty 1

## 2014-05-24 MED ORDER — HYDROCODONE-ACETAMINOPHEN 5-325 MG PO TABS
1.0000 | ORAL_TABLET | Freq: Once | ORAL | Status: AC
  Administered 2014-05-24: 1 via ORAL
  Filled 2014-05-24: qty 1

## 2014-05-24 MED ORDER — SODIUM CHLORIDE 0.9 % IV SOLN
INTRAVENOUS | Status: DC
  Administered 2014-05-24 – 2014-05-25 (×3): via INTRAVENOUS

## 2014-05-24 MED ORDER — PANTOPRAZOLE SODIUM 20 MG PO TBEC
20.0000 mg | DELAYED_RELEASE_TABLET | Freq: Every day | ORAL | Status: DC
  Filled 2014-05-24 (×2): qty 1

## 2014-05-24 MED ORDER — CLOPIDOGREL BISULFATE 75 MG PO TABS
75.0000 mg | ORAL_TABLET | Freq: Every day | ORAL | Status: DC
  Administered 2014-05-24 – 2014-05-25 (×2): 75 mg via ORAL
  Filled 2014-05-24 (×2): qty 1

## 2014-05-24 MED ORDER — ONDANSETRON HCL 4 MG PO TABS
4.0000 mg | ORAL_TABLET | Freq: Four times a day (QID) | ORAL | Status: DC | PRN
  Administered 2014-05-24 – 2014-05-26 (×3): 4 mg via ORAL
  Filled 2014-05-24 (×3): qty 1

## 2014-05-24 NOTE — ED Notes (Signed)
Pt states got up yesterday out of bed and slid from sheets. Pt states hit left ankle and both knees. Pt also c/o diarrhea with nausea.

## 2014-05-24 NOTE — H&P (Addendum)
[facility] History and Physical  Robin Blackburn NFA:[id number] DOB: August 23, 1945 DOA: 05/24/2014  Referring physician: Dr Vanita Panda - [facility]  PCP: Glo Herring., MD   Chief Complaint: fall  HPI: Robin Blackburn is a 68 y.o. female  Pt fell yesterday in the morning at 05:30. Slipped out of bed and fell onto bottom. Crawled through appartment to try and get help. Home nurse found pt down at 10:30. Pt assisted at home for the next day until presentation to the [facility]. Pts pain adn immobility have continued to worsen. Unable to stand due to pain. Majority of is in knees and feet bilat. Pt w/ baseline arthritic pain from hips to ankles bilat. Hydrocodone w/ some improvement. Very little PO since initial injury. Scared to have to get up to pee so has had very little to drink.   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 Seen by Dr. Doran Durand last week who said foot still has not healed and will need to continue wearing her boot.   Review of Systoms:  Constitutional:  No weight loss, night sweats, Fevers, chills, fatigue.  HEENT:  No headaches, Difficulty swallowing,Tooth/dental problems,Sore throat,  No sneezing, itching, ear ache, nasal congestion, post nasal drip,  Cardio-vascular:  No chest pain, Orthopnea, PND, swelling in lower extremities, anasarca, dizziness, palpitations  GI:  No heartburn, indigestion, abdominal pain, nausea, vomiting, change in bowel habits, loss of appetite  Resp:  No shortness of breath with exertion or at rest. No excess mucus, no productive cough, No non-productive cough, No coughing up of blood.No change in color of mucus.No wheezing.No chest wall deformity  Skin:  Bruising from fall.  GU:  no dysuria, change in color of urine, no urgency or frequency. No flank pain.  Musculoskeletal:  Per hpi  Psych:  No change in mood or affect. No depression or anxiety. No memory loss.   Past Medical History  Diagnosis Date    . Neuropathy   . Hypertension   . Anxiety   . Depression   . Acid reflux   . Rheumatoid arthritis   . Fibromyalgia   . Stroke 02/17/14  . CVA (cerebral infarction) 02/19/2014    Acute left thalamic   Past Surgical History  Procedure Laterality Date  . Back surgery    . Knee surgery    . Ankle reconstruction    . Abdominal hysterectomy    . Appendectomy    . Cholecystectomy     Social History:  reports that she has never smoked. She does not have any smokeless tobacco history on file. She reports that she does not drink alcohol or use illicit drugs.  Allergies  Allergen Reactions  . Demerol [Meperidine] Anaphylaxis  . Hydromorphone Anaphylaxis  . Ciprofloxacin   . Iohexol   . Levaquin [Levofloxacin In D5w]   . Penicillins   . Percocet [Oxycodone-Acetaminophen]   . Sulfa Antibiotics   . Terramycin [Oxytetracycline]   . Morphine And Related Rash    Family History  Problem Relation Age of Onset  . Hypertension Father   . Hypertension Brother   . Transient ischemic attack Father   . CVA Maternal Grandfather   . Leukemia Brother      Prior to Admission medications   Medication Sig Start Date End Date Taking? Authorizing Provider  ALPRAZolam Duanne Moron) 1 MG tablet Take 1 tablet (1 mg total) by mouth every 6 (six) hours as needed. anxiety Patient taking differently: Take 1 mg by mouth  5 (five) times daily as needed for anxiety. anxiety 02/23/14  Yes Tiffany L Reed, DO  clopidogrel (PLAVIX) 75 MG tablet Take 75 mg by mouth at bedtime. 05/03/14  Yes Historical Provider, MD  dicyclomine (BENTYL) 20 MG tablet Take 20 mg by mouth every 6 (six) hours as needed. Abdominal cramps 01/31/14  Yes Historical Provider, MD  diphenoxylate-atropine (LOMOTIL) 2.5-0.025 MG per tablet Take 1 tablet by mouth 2 (two) times daily as needed for diarrhea or loose stools. 02/22/14  Yes Rexene Alberts, MD  hydrochlorothiazide (MICROZIDE) 12.5 MG capsule Take 25 mg by mouth daily.  01/31/14  Yes Historical  Provider, MD  HYDROcodone-acetaminophen (NORCO) 7.5-325 MG per tablet Take 1 tablet by mouth 4 (four) times daily as needed. 05/03/14  Yes Historical Provider, MD  lansoprazole (PREVACID) 30 MG capsule Take 30 mg by mouth 2 (two) times daily. 02/17/14  Yes Historical Provider, MD  methocarbamol (ROBAXIN) 500 MG tablet Take 1 tablet (500 mg total) by mouth every 6 (six) hours as needed for muscle spasms. 02/22/14  Yes Rexene Alberts, MD  ondansetron (ZOFRAN) 4 MG tablet Take 4 mg by mouth every 6 (six) hours as needed. 05/03/14  Yes Historical Provider, MD  PARoxetine (PAXIL) 20 MG tablet Take 10 mg by mouth daily. 05/03/14  Yes Historical Provider, MD  potassium chloride 20 MEQ TBCR Take 20 mEq by mouth daily. 02/22/14  Yes Rexene Alberts, MD  pravastatin (PRAVACHOL) 10 MG tablet Take 10 mg by mouth at bedtime.   Yes Historical Provider, MD  RESTASIS 0.05 % ophthalmic emulsion Place 1 drop into both eyes 2 (two) times daily. 01/31/14  Yes Historical Provider, MD  zolpidem (AMBIEN) 5 MG tablet Take 10 mg by mouth at bedtime as needed for sleep. 02/22/14  Yes Rexene Alberts, MD  aspirin 325 MG tablet Take 1 tablet (325 mg total) by mouth daily. Patient not taking: Reported on 05/24/2014 02/21/14   Kathie Dike, MD  HYDROcodone-acetaminophen (NORCO/VICODIN) 5-325 MG per tablet Take two tablets by mouth every 6 hours as needed for pain Patient not taking: Reported on 05/24/2014 03/22/14   Estill Dooms, MD  lidocaine (LIDODERM) 5 % Place 1 patch onto the skin daily. Remove & Discard patch within 12 hours or as directed by MD    Historical Provider, MD  nystatin cream (MYCOSTATIN) Apply topically 2 (two) times daily. 02/21/14   Kathie Dike, MD  ondansetron (ZOFRAN) 8 MG tablet Take 1 tablet (8 mg total) by mouth every 8 (eight) hours as needed. Nausea & vomitting Patient not taking: Reported on 05/24/2014 02/22/14   Rexene Alberts, MD  simvastatin (ZOCOR) 20 MG tablet Take 1 tablet (20 mg total) by mouth  daily at 6 PM. Patient not taking: Reported on 05/24/2014 02/21/14   Kathie Dike, MD  sodium chloride (OCEAN) 0.65 % SOLN nasal spray Place 1 spray into both nostrils as needed for congestion. 02/22/14   Rexene Alberts, MD   Physical Exam: Filed Vitals:   05/24/14 1210 05/24/14 1643  BP: 156/87 157/93  Pulse: 72   Temp: 98.1 F (36.7 C)   TempSrc: Oral   Resp: 20 18  SpO2: 97% 96%    Wt Readings from Last 3 Encounters:  02/19/14 90.7 kg (199 lb 15.3 oz)    General:  Appears calm and comfortable Eyes:  PERRL, normal lids, irises & conjunctiva ENT: Dry mucus membranes  Neck:  no LAD, masses or thyromegaly Cardiovascular:  RRR, no m/r/g. No LE edema. Telemetry:  SR, no arrhythmias  Respiratory:  CTA bilaterally, no w/r/r. Normal respiratory effort. Abdomen:  soft, ntnd Skin:  no rash or induration seen on limited exam Musculoskeletal: R knee ttp w/o effusion. R foot in cam walker boot. L ankle swollen and ttp w/ deep bruising noted. Distal pulses intact. Pt moans in pain when moved in bed.  Psychiatric:  grossly normal mood and affect, speech fluent and appropriate Neurologic:  grossly non-focal.          Labs on Admission:  Basic Metabolic Panel:  Recent Labs Lab 05/24/14 1345  NA 129*  K 3.3*  CL 85*  CO2 31  GLUCOSE 122*  BUN 6  CREATININE 0.76  CALCIUM 9.1   Liver Function Tests:  Recent Labs Lab 05/24/14 1345  AST 17  ALT 17  ALKPHOS 90  BILITOT 0.8  PROT 7.1  ALBUMIN 3.3*   No results for input(s): LIPASE, AMYLASE in the last 168 hours. No results for input(s): AMMONIA in the last 168 hours. CBC:  Recent Labs Lab 05/24/14 1345  WBC 13.1*  NEUTROABS 9.8*  HGB 13.3  HCT 39.5  MCV 92.3  PLT 399   Cardiac Enzymes:  Recent Labs Lab 05/24/14 1345  CKTOTAL 253*    BNP (last 3 results) No results for input(s): PROBNP in the last 8760 hours. CBG: No results for input(s): GLUCAP in the last 168 hours.  Radiological Exams on  Admission: Dg Chest 2 View  05/24/2014   CLINICAL DATA:  [place] out of bed, hip and knee pain  EXAM: CHEST  2 VIEW  COMPARISON:  Chest x-ray of 08/27/2010  FINDINGS: No active infiltrate or effusion is seen. Mediastinal and hilar contours are unremarkable. Cardiomegaly is stable. No acute bony abnormality is seen.  IMPRESSION: No active lung disease.  Stable cardiomegaly.   Electronically Signed   By: Ivar Drape M.D.   On: 05/24/2014 15:16   Dg Hip Bilateral W/pelvis  05/24/2014   CLINICAL DATA:  Slipped and fell yesterday, hip and knee pain  EXAM: BILATERAL HIP WITH PELVIS - 4+ VIEW  COMPARISON:  None.  FINDINGS: A view of the pelvis and views of both knees were obtained. There is mild degenerative joint disease of both hips with some loss of joint space. However, no acute fracture is seen. The pelvic rami are intact. The SI joints appear corticated.  IMPRESSION: Mild degenerative change of the hips for age.  No acute abnormality.   Electronically Signed   By: Ivar Drape M.D.   On: 05/24/2014 15:15   Dg Ankle Complete Left  05/24/2014   CLINICAL DATA:  Slid out of bed this morning with pain and bruising. Swelling on the lateral ankle.  EXAM: LEFT ANKLE COMPLETE - 3+ VIEW  COMPARISON:  02/20/2014 and left foot 05/24/2014  FINDINGS: There is a nondisplaced or minimally displaced fracture at the base of the fifth metatarsal bone. Ankle is located without a fracture. Again noted is a plantar calcaneal spur.  IMPRESSION: Fracture at the base of the fifth metatarsal bone.   Electronically Signed   By: Markus Daft M.D.   On: 05/24/2014 13:29   Ct Knee Right Wo Contrast  05/24/2014   CLINICAL DATA:  Fall today. Pain and swelling of the RIGHT knee. Knee effusion.  EXAM: CT OF THE RIGHT KNEE WITHOUT CONTRAST  TECHNIQUE: Multidetector CT imaging of the RIGHT knee was performed according to the standard protocol. Multiplanar CT image reconstructions were also generated.  COMPARISON:  Radiographs 05/24/2014.   FINDINGS: Large intermediate  attenuation suprapatellar effusion is present with layering high density material. The appearance is compatible with hemarthrosis. There is no fat identified within the effusion. There is severe tricompartmental osteoarthritis of the knee, worst in the medial and patellofemoral joints. Extensor mechanism appears intact. The medial meniscus is probably macerated with complete closure of the medial joint space.  There is no fracture of the tibial plateau. Fibular head and neck appear normal. Femoral condyles are normal. Diffuse mild fatty atrophy of the proximal leg musculature.  IMPRESSION: No acute osseous abnormality. Large effusion with dependently layering high attenuation material likely representing hemarthrosis.   Electronically Signed   By: Dereck Ligas M.D.   On: 05/24/2014 16:36   Dg Knee Complete 4 Views Left  05/24/2014   CLINICAL DATA:  [place] out of bed, knee pain  EXAM: LEFT KNEE - COMPLETE 4+ VIEW  COMPARISON:  None.  FINDINGS: There is mild tricompartmental degenerative joint disease of the left knee primarily involving the medial compartment where there is more loss of joint space. No fracture is seen. No joint effusion is noted.  IMPRESSION: No acute abnormality. Mild tricompartmental degenerative joint disease.   Electronically Signed   By: Ivar Drape M.D.   On: 05/24/2014 15:20   Dg Knee Complete 4 Views Right  05/24/2014   CLINICAL DATA:  Pain and swelling in the right knee. Slid out of bed.  EXAM: RIGHT KNEE - COMPLETE 4+ VIEW  COMPARISON:  Knee MRI 07/20/2007  FINDINGS: There is medial joint space narrowing with osteophyte formations. There is a large suprapatellar joint effusion. Negative for an acute fracture or dislocation. Extensive degenerative changes at the patellofemoral compartment.  IMPRESSION: Moderate to severe osteoarthritis in the right knee. No acute bone abnormality.  Large joint effusion. The large joint effusion could be secondary to  degenerative disease. If there is concern for an occult injury, recommend for further evaluation with CT.   Electronically Signed   By: Markus Daft M.D.   On: 05/24/2014 13:34   Dg Foot Complete Left  05/24/2014   CLINICAL DATA:  [place] out of bed. Pain, bruising and swelling in the lateral foot.  EXAM: LEFT FOOT - COMPLETE 3+ VIEW  COMPARISON:  Left ankle 05/24/2014  FINDINGS: There is a minimally displaced fracture involving the base of the fifth metatarsal. There is a mild hallux valgus deformity. Prominent plantar calcaneal spur. Lateral foot soft tissue swelling.  IMPRESSION: Minimally displaced fracture at the base of the fifth metatarsal bone.   Electronically Signed   By: Markus Daft M.D.   On: 05/24/2014 13:48    EKG: ordered  Assessment/Plan Principal Problem:   Fall Active Problems:   Essential hypertension   Neuropathy   Anxiety   Hyponatremia   Hypokalemia   Leukocytosis   Rheumatoid arthritis   H/O: CVA (cerebrovascular accident)   Fracture of 5th metatarsal   HLD (hyperlipidemia)   Physical deconditioning  Fall: likely mechanical based on description. Pt becoming progressively more weak over time. Numerous chronic chronic RA compounded by recent stroke complicating pts daily living. Fall resulted in a closed, minimally displaced fracture of the 5th metatarsal. Pt already in cam walker boot from fracture sustained to R foot in September after stroke. Per pt Dr. Doran Durand is her orthopedic surgeon adn wanted her to continue to stay in the boot as the foot has not healed. Pt unable to ambulate and care for self. In extreme pain.  - Admit - Pain medications  - Place L foot in  Cam walker boot.  - Call Dr. Doran Durand in the am to discuss f/u. Unlikely for urgent surgical intervention - PT/OT - Nutrition - Social work - CK in the am (initial valuse slightly elevated to 253)  H/o CVA: 02/19/14 pt admitted for stroke and found to have 1 cm acute infarction with a left lateral thalamus.  No permentant deficits noted during admission. No sign of recurrance - PT/OT as above - ASA plavix  HTN: stable - continue home HCTZ,   Leukocytosis: WBC 13.2 on admission. AFVSS. Likely stress reaction.  - CBC in am  Hyponatremia: likely secondary to poor nutrition over past few days. Pt clinically dehydrated. NS bolus in ED - NS 171ml/hr - BMET in am  Chronic pain: h/o RA and joint replacements. Pt not on long-term controller medications for RA.  - continue lidoderm patch, Robaxin,   Depression/Anxiety: pt very anxious - continue home paxil, xanax  GERD:  - continue PPI  HLD:  - continue pravastatin   Code Status: FULL DVT Prophylaxis: Hep Family Communication: Aunt Disposition Plan: pending improvement     MERRELL, [facility] [email or web address] Password [email or web address]

## 2014-05-24 NOTE — ED Provider Notes (Signed)
CSN: [id number]     Arrival date & time 05/24/14  1208 History  This chart was scribe for Waldemar Dickens, MD by Judithann Sauger, ED Scribe. The patient was seen in room A323/A323-01 and the patient's care was started at 1:25 PM.   Chief Complaint  Patient presents with  . Fall  . Diarrhea   The history is provided by the patient. No language interpreter was used.   HPI Comments: Robin Blackburn is a 68 y.o. female who presents to the Emergency Department status post fall that occurred yesterday morning. She reports that she hit something and slid down to the floor and it took her about 5 hours to get to the door to ask for help. She states that the pain and swelling in her left foot, left ankle, and right knee has been progressively worse. She also reports 3 episodes of loose stool and nausea since yesterday morning. She adds that she had a fever of 99.5 last night. She denies vomiting, SOB, and confusion. She reports that she had a stroke in September and has been experiencing weakness and her sitter adds that although she was doing well, she could be doing better.   Past Medical History  Diagnosis Date  . Neuropathy   . Hypertension   . Anxiety   . Depression   . Acid reflux   . Rheumatoid arthritis   . Fibromyalgia   . Stroke 02/17/14  . CVA (cerebral infarction) 02/19/2014    Acute left thalamic   Past Surgical History  Procedure Laterality Date  . Back surgery    . Knee surgery    . Ankle reconstruction    . Abdominal hysterectomy    . Appendectomy    . Cholecystectomy     Family History  Problem Relation Age of Onset  . Hypertension Father   . Hypertension Brother   . Transient ischemic attack Father   . CVA Maternal Grandfather   . Leukemia Brother    History  Substance Use Topics  . Smoking status: Never Smoker   . Smokeless tobacco: Not on file  . Alcohol Use: No   OB History    No data available     Review of Systems  Constitutional:       Per HPI,  otherwise negative  HENT:       Per HPI, otherwise negative  Respiratory: Negative for shortness of breath.        Per HPI, otherwise negative  Cardiovascular:       Per HPI, otherwise negative  Gastrointestinal: Positive for nausea and diarrhea. Negative for vomiting.  Endocrine:       Negative aside from HPI  Genitourinary:       Neg aside from HPI   Musculoskeletal: Positive for joint swelling (right knee).       Per HPI, otherwise negative  Skin: Negative.   Neurological: Negative for syncope.  Psychiatric/Behavioral: Negative for confusion.      Allergies  Demerol; Hydromorphone; Ciprofloxacin; Iohexol; Levaquin; Penicillins; Percocet; Sulfa antibiotics; Terramycin; and Morphine and related  Home Medications   Prior to Admission medications   Medication Sig Start Date End Date Taking? Authorizing Provider  ALPRAZolam Duanne Moron) 1 MG tablet Take 1 tablet (1 mg total) by mouth every 6 (six) hours as needed. anxiety Patient taking differently: Take 1 mg by mouth 5 (five) times daily as needed for anxiety. anxiety 02/23/14  Yes Tiffany L Reed, DO  clopidogrel (PLAVIX) 75 MG tablet Take 75 mg  by mouth at bedtime. 05/03/14  Yes Historical Provider, MD  dicyclomine (BENTYL) 20 MG tablet Take 20 mg by mouth every 6 (six) hours as needed. Abdominal cramps 01/31/14  Yes Historical Provider, MD  diphenoxylate-atropine (LOMOTIL) 2.5-0.025 MG per tablet Take 1 tablet by mouth 2 (two) times daily as needed for diarrhea or loose stools. 02/22/14  Yes Rexene Alberts, MD  hydrochlorothiazide (MICROZIDE) 12.5 MG capsule Take 25 mg by mouth daily.  01/31/14  Yes Historical Provider, MD  HYDROcodone-acetaminophen (NORCO) 7.5-325 MG per tablet Take 1 tablet by mouth 4 (four) times daily as needed. 05/03/14  Yes Historical Provider, MD  lansoprazole (PREVACID) 30 MG capsule Take 30 mg by mouth 2 (two) times daily. 02/17/14  Yes Historical Provider, MD  methocarbamol (ROBAXIN) 500 MG tablet Take 1 tablet  (500 mg total) by mouth every 6 (six) hours as needed for muscle spasms. 02/22/14  Yes Rexene Alberts, MD  ondansetron (ZOFRAN) 4 MG tablet Take 4 mg by mouth every 6 (six) hours as needed. 05/03/14  Yes Historical Provider, MD  PARoxetine (PAXIL) 20 MG tablet Take 10 mg by mouth daily. 05/03/14  Yes Historical Provider, MD  potassium chloride 20 MEQ TBCR Take 20 mEq by mouth daily. 02/22/14  Yes Rexene Alberts, MD  pravastatin (PRAVACHOL) 10 MG tablet Take 10 mg by mouth at bedtime.   Yes Historical Provider, MD  RESTASIS 0.05 % ophthalmic emulsion Place 1 drop into both eyes 2 (two) times daily. 01/31/14  Yes Historical Provider, MD  zolpidem (AMBIEN) 5 MG tablet Take 10 mg by mouth at bedtime as needed for sleep. 02/22/14  Yes Rexene Alberts, MD  aspirin 325 MG tablet Take 1 tablet (325 mg total) by mouth daily. Patient not taking: Reported on 05/24/2014 02/21/14   Kathie Dike, MD  HYDROcodone-acetaminophen (NORCO/VICODIN) 5-325 MG per tablet Take two tablets by mouth every 6 hours as needed for pain Patient not taking: Reported on 05/24/2014 03/22/14   Estill Dooms, MD  lidocaine (LIDODERM) 5 % Place 1 patch onto the skin daily. Remove & Discard patch within 12 hours or as directed by MD    Historical Provider, MD  nystatin cream (MYCOSTATIN) Apply topically 2 (two) times daily. 02/21/14   Kathie Dike, MD  ondansetron (ZOFRAN) 8 MG tablet Take 1 tablet (8 mg total) by mouth every 8 (eight) hours as needed. Nausea & vomitting Patient not taking: Reported on 05/24/2014 02/22/14   Rexene Alberts, MD  simvastatin (ZOCOR) 20 MG tablet Take 1 tablet (20 mg total) by mouth daily at 6 PM. Patient not taking: Reported on 05/24/2014 02/21/14   Kathie Dike, MD  sodium chloride (OCEAN) 0.65 % SOLN nasal spray Place 1 spray into both nostrils as needed for congestion. 02/22/14   Rexene Alberts, MD   BP 158/65 mmHg  Pulse 64  Temp(Src) 98.3 F (36.8 C) (Oral)  Resp 17  Ht 5\' 6"  (1.676 m)  Wt 185 lb 3 oz  (84 kg)  BMI 29.90 kg/m2  SpO2 100% Physical Exam  Constitutional: She is oriented to person, place, and time. She appears well-developed and well-nourished. No distress.  HENT:  Head: Normocephalic and atraumatic.  Eyes: Conjunctivae and EOM are normal.  Cardiovascular: Normal rate and regular rhythm.   Pulmonary/Chest: Effort normal and breath sounds normal. No stridor. No respiratory distress.  Abdominal: She exhibits no distension.  Musculoskeletal: She exhibits no edema.  Good strength bilaterally.  No drift TTP left knee, no deformity. TTP left ankle, she can flex and  extend the foot. Good pulses and good capillary refills.  Right knee is TTP Diffuse pain all over without distension ROM is limited to 160/175.  Right ankle has a walking booth on it.  Sore in both hips.  TTP to the inferior aspect   Neurological: She is alert and oriented to person, place, and time. No cranial nerve deficit.  Skin: Skin is warm and dry.  Psychiatric: She has a normal mood and affect.  Nursing note and vitals reviewed.   ED Course  Procedures (including critical care time) DIAGNOSTIC STUDIES: Oxygen Saturation is 97% on RA, normal by my interpretation.    COORDINATION OF CARE: 1:36 PM- Pt advised of plan for treatment and pt agrees.    Labs Review Labs Reviewed  COMPREHENSIVE METABOLIC PANEL - Abnormal; Notable for the following:    Sodium 129 (*)    Potassium 3.3 (*)    Chloride 85 (*)    Glucose, Bld 122 (*)    Albumin 3.3 (*)    GFR calc non Af Amer 85 (*)    All other components within normal limits  CBC WITH DIFFERENTIAL - Abnormal; Notable for the following:    WBC 13.1 (*)    Neutro Abs 9.8 (*)    Monocytes Absolute 1.2 (*)    All other components within normal limits  CK - Abnormal; Notable for the following:    Total CK 253 (*)    All other components within normal limits  URINALYSIS, ROUTINE W REFLEX MICROSCOPIC  COMPREHENSIVE METABOLIC PANEL  CBC  I-STAT CG4  LACTIC ACID, ED    Imaging Review Dg Chest 2 View  05/24/2014   CLINICAL DATA:  [place] out of bed, hip and knee pain  EXAM: CHEST  2 VIEW  COMPARISON:  Chest x-ray of 08/27/2010  FINDINGS: No active infiltrate or effusion is seen. Mediastinal and hilar contours are unremarkable. Cardiomegaly is stable. No acute bony abnormality is seen.  IMPRESSION: No active lung disease.  Stable cardiomegaly.   Electronically Signed   By: Ivar Drape M.D.   On: 05/24/2014 15:16   Dg Hip Bilateral W/pelvis  05/24/2014   CLINICAL DATA:  Slipped and fell yesterday, hip and knee pain  EXAM: BILATERAL HIP WITH PELVIS - 4+ VIEW  COMPARISON:  None.  FINDINGS: A view of the pelvis and views of both knees were obtained. There is mild degenerative joint disease of both hips with some loss of joint space. However, no acute fracture is seen. The pelvic rami are intact. The SI joints appear corticated.  IMPRESSION: Mild degenerative change of the hips for age.  No acute abnormality.   Electronically Signed   By: Ivar Drape M.D.   On: 05/24/2014 15:15   Dg Ankle Complete Left  05/24/2014   CLINICAL DATA:  Slid out of bed this morning with pain and bruising. Swelling on the lateral ankle.  EXAM: LEFT ANKLE COMPLETE - 3+ VIEW  COMPARISON:  02/20/2014 and left foot 05/24/2014  FINDINGS: There is a nondisplaced or minimally displaced fracture at the base of the fifth metatarsal bone. Ankle is located without a fracture. Again noted is a plantar calcaneal spur.  IMPRESSION: Fracture at the base of the fifth metatarsal bone.   Electronically Signed   By: Markus Daft M.D.   On: 05/24/2014 13:29   Ct Knee Right Wo Contrast  05/24/2014   CLINICAL DATA:  Fall today. Pain and swelling of the RIGHT knee. Knee effusion.  EXAM: CT OF THE RIGHT  KNEE WITHOUT CONTRAST  TECHNIQUE: Multidetector CT imaging of the RIGHT knee was performed according to the standard protocol. Multiplanar CT image reconstructions were also generated.  COMPARISON:   Radiographs 05/24/2014.  FINDINGS: Large intermediate attenuation suprapatellar effusion is present with layering high density material. The appearance is compatible with hemarthrosis. There is no fat identified within the effusion. There is severe tricompartmental osteoarthritis of the knee, worst in the medial and patellofemoral joints. Extensor mechanism appears intact. The medial meniscus is probably macerated with complete closure of the medial joint space.  There is no fracture of the tibial plateau. Fibular head and neck appear normal. Femoral condyles are normal. Diffuse mild fatty atrophy of the proximal leg musculature.  IMPRESSION: No acute osseous abnormality. Large effusion with dependently layering high attenuation material likely representing hemarthrosis.   Electronically Signed   By: Dereck Ligas M.D.   On: 05/24/2014 16:36   Dg Knee Complete 4 Views Left  05/24/2014   CLINICAL DATA:  [place] out of bed, knee pain  EXAM: LEFT KNEE - COMPLETE 4+ VIEW  COMPARISON:  None.  FINDINGS: There is mild tricompartmental degenerative joint disease of the left knee primarily involving the medial compartment where there is more loss of joint space. No fracture is seen. No joint effusion is noted.  IMPRESSION: No acute abnormality. Mild tricompartmental degenerative joint disease.   Electronically Signed   By: Ivar Drape M.D.   On: 05/24/2014 15:20   Dg Knee Complete 4 Views Right  05/24/2014   CLINICAL DATA:  Pain and swelling in the right knee. Slid out of bed.  EXAM: RIGHT KNEE - COMPLETE 4+ VIEW  COMPARISON:  Knee MRI 07/20/2007  FINDINGS: There is medial joint space narrowing with osteophyte formations. There is a large suprapatellar joint effusion. Negative for an acute fracture or dislocation. Extensive degenerative changes at the patellofemoral compartment.  IMPRESSION: Moderate to severe osteoarthritis in the right knee. No acute bone abnormality.  Large joint effusion. The large joint effusion  could be secondary to degenerative disease. If there is concern for an occult injury, recommend for further evaluation with CT.   Electronically Signed   By: Markus Daft M.D.   On: 05/24/2014 13:34   Dg Foot Complete Left  05/24/2014   CLINICAL DATA:  [place] out of bed. Pain, bruising and swelling in the lateral foot.  EXAM: LEFT FOOT - COMPLETE 3+ VIEW  COMPARISON:  Left ankle 05/24/2014  FINDINGS: There is a minimally displaced fracture involving the base of the fifth metatarsal. There is a mild hallux valgus deformity. Prominent plantar calcaneal spur. Lateral foot soft tissue swelling.  IMPRESSION: Minimally displaced fracture at the base of the fifth metatarsal bone.   Electronically Signed   By: Markus Daft M.D.   On: 05/24/2014 13:48    Patient had postoperative shoe placed on the left foot.   MDM   Final diagnoses:  Injury   fall A fracture of the fifth metatarsal, left foot Hyponatremia.  Patient presents with ongoing weakness, and after a fall that occurred yesterday. Today the patient is found to have a new fracture of the left fifth metatarsal, given the patient 2 broken feet, and making her incapable of returning home. Patient's hyponatremia was addressed with IV fluids, and the patient actually looked better here following resuscitation. Given the patient's inability to take care of herself given the 2 broken feet, she was admitted for further evaluation, management, consideration of assistance with placement and/or therapy.    I  personally performed the services described in this documentation, which was scribed in my presence. The recorded information has been reviewed and is accurate.     Carmin Muskrat, MD 05/24/14 2159

## 2014-05-25 DIAGNOSIS — W19XXXD Unspecified fall, subsequent encounter: Secondary | ICD-10-CM

## 2014-05-25 DIAGNOSIS — S92302A Fracture of unspecified metatarsal bone(s), left foot, initial encounter for closed fracture: Secondary | ICD-10-CM

## 2014-05-25 LAB — CBC
HCT: 36.3 % (ref 36.0–46.0)
Hemoglobin: 12.2 g/dL (ref 12.0–15.0)
MCH: 31.3 pg (ref 26.0–34.0)
MCHC: 33.6 g/dL (ref 30.0–36.0)
MCV: 93.1 fL (ref 78.0–100.0)
PLATELETS: 377 10*3/uL (ref 150–400)
RBC: 3.9 MIL/uL (ref 3.87–5.11)
RDW: 12.6 % (ref 11.5–15.5)
WBC: 10.3 10*3/uL (ref 4.0–10.5)

## 2014-05-25 LAB — COMPREHENSIVE METABOLIC PANEL
ALT: 14 U/L (ref 0–35)
AST: 15 U/L (ref 0–37)
Albumin: 2.9 g/dL — ABNORMAL LOW (ref 3.5–5.2)
Alkaline Phosphatase: 82 U/L (ref 39–117)
Anion gap: 11 (ref 5–15)
BILIRUBIN TOTAL: 0.6 mg/dL (ref 0.3–1.2)
BUN: 5 mg/dL — ABNORMAL LOW (ref 6–23)
CHLORIDE: 92 meq/L — AB (ref 96–112)
CO2: 29 meq/L (ref 19–32)
Calcium: 8.6 mg/dL (ref 8.4–10.5)
Creatinine, Ser: 0.64 mg/dL (ref 0.50–1.10)
GFR, EST NON AFRICAN AMERICAN: 90 mL/min — AB (ref >90)
GLUCOSE: 109 mg/dL — AB (ref 70–99)
Potassium: 3.2 mEq/L — ABNORMAL LOW (ref 3.7–5.3)
Sodium: 132 mEq/L — ABNORMAL LOW (ref 137–147)
Total Protein: 6.4 g/dL (ref 6.0–8.3)

## 2014-05-25 MED ORDER — HYDROCODONE-ACETAMINOPHEN 7.5-325 MG PO TABS
1.0000 | ORAL_TABLET | Freq: Four times a day (QID) | ORAL | Status: DC | PRN
  Administered 2014-05-25 (×2): 1 via ORAL
  Filled 2014-05-25 (×2): qty 1

## 2014-05-25 MED ORDER — HYDROCODONE-ACETAMINOPHEN 7.5-325 MG PO TABS
1.0000 | ORAL_TABLET | ORAL | Status: DC | PRN
  Administered 2014-05-25 – 2014-05-26 (×2): 1 via ORAL
  Filled 2014-05-25 (×2): qty 1

## 2014-05-25 MED ORDER — PANTOPRAZOLE SODIUM 40 MG PO TBEC
40.0000 mg | DELAYED_RELEASE_TABLET | Freq: Every day | ORAL | Status: DC
  Administered 2014-05-25: 40 mg via ORAL
  Filled 2014-05-25: qty 1

## 2014-05-25 MED ORDER — PANTOPRAZOLE SODIUM 40 MG PO TBEC
40.0000 mg | DELAYED_RELEASE_TABLET | Freq: Every day | ORAL | Status: DC
  Administered 2014-05-26: 40 mg via ORAL
  Filled 2014-05-25: qty 1

## 2014-05-25 MED ORDER — HYDROCODONE-ACETAMINOPHEN 7.5-325 MG PO TABS: 1.0000 | ORAL_TABLET | Freq: Four times a day (QID) | ORAL | Status: DC | PRN

## 2014-05-25 NOTE — Progress Notes (Signed)
UR chart review completed.  

## 2014-05-25 NOTE — Progress Notes (Signed)
[facility] PROGRESS NOTE  Robin Blackburn YJE:[id number] DOB: 12-10-1945 DOA: 05/24/2014 PCP: Glo Herring., MD  Assessment/Plan: Mechanical Fall -No LOC. -Has been evaluated by PT with recommendations for SNF.  Right 5th Metatarsal Fracture -Have requested ortho consult. -CAM boot has been ordered (?by ortho).  RA -Has "pain all over". -Continue chronic pain meds including vicodin.  HTN -Stable. -Continue home meds.  Hyponatremia -129 on admission. -Up to 132 12/17. -Continue IVF.  Code Status: Full Code Family Communication: Aunt at bedside  Disposition Plan: To SNF likely in am   Consultants:  Ortho   Antibiotics:  None   Subjective: Still with c/o pain from knees down.  Objective: Filed Vitals:   05/24/14 1643 05/24/14 1904 05/24/14 2023 05/25/14 0512  BP: 157/93 143/68 158/65 126/73  Pulse:  60 64 72  Temp:   98.3 F (36.8 C) 98.5 F (36.9 C)  TempSrc:   Oral Oral  Resp: 18 20 17 18   Height:   5\' 6"  (1.676 m)   Weight:   84 kg (185 lb 3 oz)   SpO2: 96% 98% 100% 97%    Intake/Output Summary (Last 24 hours) at 05/25/14 1437 Last data filed at 05/25/14 0830  Gross per 24 hour  Intake   1150 ml  Output   1000 ml  Net    150 ml   Filed Weights   05/24/14 2023  Weight: 84 kg (185 lb 3 oz)    Exam:   General:  AA Ox3  Cardiovascular: RRR  Respiratory: CTA B  Abdomen: S/NT/ND/+BS  Extremities: no C/C/E, right foot in CAM boot   Neurologic:  Non-focal  Data Reviewed: Basic Metabolic Panel:  Recent Labs Lab 05/24/14 1345 05/25/14 0605  NA 129* 132*  K 3.3* 3.2*  CL 85* 92*  CO2 31 29  GLUCOSE 122* 109*  BUN 6 5*  CREATININE 0.76 0.64  CALCIUM 9.1 8.6   Liver Function Tests:  Recent Labs Lab 05/24/14 1345 05/25/14 0605  AST 17 15  ALT 17 14  ALKPHOS 90 82  BILITOT 0.8 0.6  PROT 7.1 6.4  ALBUMIN 3.3* 2.9*   No results for input(s): LIPASE, AMYLASE in the last 168 hours. No results for input(s):  AMMONIA in the last 168 hours. CBC:  Recent Labs Lab 05/24/14 1345 05/25/14 0605  WBC 13.1* 10.3  NEUTROABS 9.8*  --   HGB 13.3 12.2  HCT 39.5 36.3  MCV 92.3 93.1  PLT 399 377   Cardiac Enzymes:  Recent Labs Lab 05/24/14 1345  CKTOTAL 253*   BNP (last 3 results) No results for input(s): PROBNP in the last 8760 hours. CBG: No results for input(s): GLUCAP in the last 168 hours.  No results found for this or any previous visit (from the past 240 hour(s)).   Studies: Dg Chest 2 View  05/24/2014   CLINICAL DATA:  [place] out of bed, hip and knee pain  EXAM: CHEST  2 VIEW  COMPARISON:  Chest x-ray of 08/27/2010  FINDINGS: No active infiltrate or effusion is seen. Mediastinal and hilar contours are unremarkable. Cardiomegaly is stable. No acute bony abnormality is seen.  IMPRESSION: No active lung disease.  Stable cardiomegaly.   Electronically Signed   By: Ivar Drape M.D.   On: 05/24/2014 15:16   Dg Hip Bilateral W/pelvis  05/24/2014   CLINICAL DATA:  Slipped and fell yesterday, hip and knee pain  EXAM: BILATERAL HIP WITH PELVIS - 4+ VIEW  COMPARISON:  None.  FINDINGS: A view of the pelvis and views of both knees were obtained. There is mild degenerative joint disease of both hips with some loss of joint space. However, no acute fracture is seen. The pelvic rami are intact. The SI joints appear corticated.  IMPRESSION: Mild degenerative change of the hips for age.  No acute abnormality.   Electronically Signed   By: Ivar Drape M.D.   On: 05/24/2014 15:15   Dg Ankle Complete Left  05/24/2014   CLINICAL DATA:  Slid out of bed this morning with pain and bruising. Swelling on the lateral ankle.  EXAM: LEFT ANKLE COMPLETE - 3+ VIEW  COMPARISON:  02/20/2014 and left foot 05/24/2014  FINDINGS: There is a nondisplaced or minimally displaced fracture at the base of the fifth metatarsal bone. Ankle is located without a fracture. Again noted is a plantar calcaneal spur.  IMPRESSION: Fracture at  the base of the fifth metatarsal bone.   Electronically Signed   By: Markus Daft M.D.   On: 05/24/2014 13:29   Ct Knee Right Wo Contrast  05/24/2014   CLINICAL DATA:  Fall today. Pain and swelling of the RIGHT knee. Knee effusion.  EXAM: CT OF THE RIGHT KNEE WITHOUT CONTRAST  TECHNIQUE: Multidetector CT imaging of the RIGHT knee was performed according to the standard protocol. Multiplanar CT image reconstructions were also generated.  COMPARISON:  Radiographs 05/24/2014.  FINDINGS: Large intermediate attenuation suprapatellar effusion is present with layering high density material. The appearance is compatible with hemarthrosis. There is no fat identified within the effusion. There is severe tricompartmental osteoarthritis of the knee, worst in the medial and patellofemoral joints. Extensor mechanism appears intact. The medial meniscus is probably macerated with complete closure of the medial joint space.  There is no fracture of the tibial plateau. Fibular head and neck appear normal. Femoral condyles are normal. Diffuse mild fatty atrophy of the proximal leg musculature.  IMPRESSION: No acute osseous abnormality. Large effusion with dependently layering high attenuation material likely representing hemarthrosis.   Electronically Signed   By: Dereck Ligas M.D.   On: 05/24/2014 16:36   Dg Knee Complete 4 Views Left  05/24/2014   CLINICAL DATA:  [place] out of bed, knee pain  EXAM: LEFT KNEE - COMPLETE 4+ VIEW  COMPARISON:  None.  FINDINGS: There is mild tricompartmental degenerative joint disease of the left knee primarily involving the medial compartment where there is more loss of joint space. No fracture is seen. No joint effusion is noted.  IMPRESSION: No acute abnormality. Mild tricompartmental degenerative joint disease.   Electronically Signed   By: Ivar Drape M.D.   On: 05/24/2014 15:20   Dg Knee Complete 4 Views Right  05/24/2014   CLINICAL DATA:  Pain and swelling in the right knee. Slid out  of bed.  EXAM: RIGHT KNEE - COMPLETE 4+ VIEW  COMPARISON:  Knee MRI 07/20/2007  FINDINGS: There is medial joint space narrowing with osteophyte formations. There is a large suprapatellar joint effusion. Negative for an acute fracture or dislocation. Extensive degenerative changes at the patellofemoral compartment.  IMPRESSION: Moderate to severe osteoarthritis in the right knee. No acute bone abnormality.  Large joint effusion. The large joint effusion could be secondary to degenerative disease. If there is concern for an occult injury, recommend for further evaluation with CT.   Electronically Signed   By: Markus Daft M.D.   On: 05/24/2014 13:34   Dg Foot Complete Left  05/24/2014   CLINICAL DATA:  [place]  out of bed. Pain, bruising and swelling in the lateral foot.  EXAM: LEFT FOOT - COMPLETE 3+ VIEW  COMPARISON:  Left ankle 05/24/2014  FINDINGS: There is a minimally displaced fracture involving the base of the fifth metatarsal. There is a mild hallux valgus deformity. Prominent plantar calcaneal spur. Lateral foot soft tissue swelling.  IMPRESSION: Minimally displaced fracture at the base of the fifth metatarsal bone.   Electronically Signed   By: Markus Daft M.D.   On: 05/24/2014 13:48    Scheduled Meds: . clopidogrel  75 mg Oral QHS  . cycloSPORINE  1 drop Both Eyes BID  . heparin  5,000 Units Subcutaneous 3 times per day  . hydrochlorothiazide  25 mg Oral Daily  . lidocaine  1 patch Transdermal Q24H  . pantoprazole  40 mg Oral Daily  . PARoxetine  10 mg Oral Daily  . potassium chloride SA  20 mEq Oral Daily  . pravastatin  10 mg Oral QHS  . senna  1 tablet Oral BID   Continuous Infusions: . sodium chloride 125 mL/hr at 05/24/14 1757  . sodium chloride 100 mL/hr at 05/25/14 1210    Principal Problem:   Fall Active Problems:   H/O: CVA (cerebrovascular accident)   Fracture of 5th metatarsal   Physical deconditioning   Essential hypertension   Neuropathy   Anxiety   Hyponatremia    Hypokalemia   Leukocytosis   Rheumatoid arthritis   HLD (hyperlipidemia)    Time spent: 35 minutes. Greater than 50% of this time was spent in direct contact with the patient coordinating care.    Lelon Frohlich  [facility] Pager [phone]  If 7PM-7AM, please contact night-coverage at [email or web address], password [facility] 05/25/2014, 2:37 PM  LOS: 1 day

## 2014-05-25 NOTE — Clinical Social Work Psychosocial (Signed)
Clinical Social Work Department BRIEF PSYCHOSOCIAL ASSESSMENT 05/25/2014  Patient:  VENETIA, PREWITT     Account Number:  [id number]     Admit date:  05/24/2014  Clinical Social Worker:  Wyatt Haste  Date/Time:  05/25/2014 01:46 PM  Referred by:  CSW  Date Referred:  05/25/2014 Referred for  SNF Placement   Other Referral:   Interview type:  Patient Other interview type:    PSYCHOSOCIAL DATA Living Status:  ALONE Admitted from facility:   Level of care:   Primary support name:  Ronnie Primary support relationship to patient:  SIBLING Degree of support available:   supportive    CURRENT CONCERNS Current Concerns  Post-Acute Placement   Other Concerns:    SOCIAL WORK ASSESSMENT / PLAN CSW met with pt at bedside. Pt alert and oriented and is known to CSW from previous admission. Pt had a stroke in September and went to [facility] for rehab. She states that she did well there and returned home in October. She feels things were going fairly well at home until Tuesday morning when she fell out of the bed. Pt had been paying for someone to clean her home and provide transportation, but otherwise was fairly independent. She describes her brother and aunt as her best support and said they live nearby. She ambulates with a rollator. Pt reports right hand weakness related to stroke. PT evaluated pt today and recommendation is for SNF. CSW discussed placement process and provided SNF list. She is very hopeful that [facility] will be able to take her again ans she loved the staff/therapists and made a lot of friends with residents. Pt is open to [facility] if necessary.   Assessment/plan status:  Psychosocial Support/Ongoing Assessment of Needs Other assessment/ plan:   Information/referral to community resources:   SNF list    PATIENT'S/FAMILY'S RESPONSE TO PLAN OF CARE: Pt states she wants so much to be independent again. CSW provided brief support. Will initiate bed search and follow up.        [name], [place]

## 2014-05-25 NOTE — Clinical Social Work Placement (Signed)
Clinical Social Work Department CLINICAL SOCIAL WORK PLACEMENT NOTE 05/25/2014  Patient:  AUTYM, SIESS  Account Number:  [id number] Dawn date:  05/24/2014  Clinical Social Worker:  [name], LCSW Date/time:  05/25/2014 01:44 PM  Clinical Social Work is seeking post-discharge placement for this patient at the following level of care:   [place]   (*CSW will update this form in Epic as items are completed)   05/25/2014  Patient/family provided with [place] Department of Clinical Social Work's list of facilities offering this level of care within the geographic area requested by the patient (or if unable, by the patient's family).  05/25/2014  Patient/family informed of their freedom to choose among providers that offer the needed level of care, that participate in Medicare, Medicaid or managed care program needed by the patient, have an available bed and are willing to accept the patient.  05/25/2014  Patient/family informed of [facility]' ownership interest in [facility], as well as of the fact that they are under no obligation to receive care at this facility.  PASARR submitted to EDS on  PASARR number received on   FL2 transmitted to all facilities in geographic area requested by pt/family on  05/25/2014 FL2 transmitted to all facilities within larger geographic area on   Patient informed that his/her managed care company has contracts with or will negotiate with  certain facilities, including the following:     Patient/family informed of bed offers received:   Patient chooses bed at  Physician recommends and patient chooses bed at    Patient to be transferred to  on   Patient to be transferred to facility by  Patient and family notified of transfer on  Name of family member notified:    The following physician request were entered in Epic:   Additional Comments: Pt has existing pasarr.  [name], [place]

## 2014-05-25 NOTE — Evaluation (Signed)
Physical Therapy Evaluation Patient Details Name: Robin Blackburn MRN: [id number] DOB: [birth year] Today's Date: 05/25/2014   History of Present Illness  Robin Blackburn is a 68 y.o. female.  Pt fell yesterday in the morning at 05:30. Slipped out of bed and fell onto bottom. Crawled through appartment to try and get help. Home nurse found pt down at 10:30. Pt assisted at home for the next day until presentation to the [facility]. Pts pain adn immobility have continued to worsen. Unable to stand due to pain. Majority of is in knees and feet bilat. Pt w/ baseline arthritic pain from hips to ankles bilat. Hydrocodone w/ some improvement. Very little PO since initial injury. Scared to have to get up to pee so has had very little to drink.   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 Seen by Dr. Doran Durand last week who said foot still has not healed and will need to continue wearing her boot.     Clinical Impression   Pt was seen for evaluation.  She is significantly limited by pain describing pain as a 9-10/10 all over her body.  Some of the pain is from falling out of bed and the rest of it from crawling about on the floor.  She has a new fracture of the left 5th metatarsal and a CAM walker was ordered for that LE.  I obtained one for her and fitted it to the left foot.  She was found to have minimal active motion of her LEs due to perceived pain.  The right knee is moderately edemetous with ROM approximately 20-60 degrees.  The left knee is much less edemetous but with similar ROM.  I stressed to the pt how important it is for her to try to move through the pain.  She needed assist to assume the seated position and maximal assist to stand with a walker.  I instructed her to minimize weight bearing on the left foot as able but she tended to bear weight on the left equal to the right with no report of pain. She was unable to off load weight from either foot to pivot from bed to  chair.  She will need SNF at d/c.  We will see daily to try to increase strength, ROM and mobility.    Follow Up Recommendations SNF    Equipment Recommendations  None recommended by PT    Recommendations for Other Services    none    Precautions / Restrictions Precautions Precautions: Fall Required Braces or Orthoses: Other Brace/Splint Other Brace/Splint: CAM walker bilaterally...the left one was fitted to her today Restrictions Weight Bearing Restrictions: Yes RLE Weight Bearing: Weight bearing as tolerated LLE Weight Bearing: Partial weight bearing Other Position/Activity Restrictions: Orthopedist is consulted to see pt sometime today and will hopefully determine weight bearing status on the left...until then we will minimize weight bearing on the left as much as possible      Mobility  Bed Mobility Overal bed mobility: Needs Assistance Bed Mobility: Supine to Sit     Supine to sit: Min assist     General bed mobility comments: assist needed to bring LEs over the EOB  Transfers Overall transfer level: Needs assistance Equipment used: Rolling walker (2 wheeled) Transfers: Sit to/from Stand Sit to Stand: Max assist;From elevated surface         General transfer comment: pt has bilateral CAM walkers in place...she was instructed to try to keep weight off  of the left foot as possible but she did weight bear with no observable or reported pain.[name] KitchenMarland Kitchenpt was unable to offload weight from either leg in order to transfer to chair  Ambulation/Gait Ambulation/Gait assistance:  (unable)              Stairs            Wheelchair Mobility    Modified Rankin (Stroke Patients Only)       Balance Overall balance assessment: Needs assistance Sitting-balance support: No upper extremity supported;Feet supported Sitting balance-Leahy Scale: Good     Standing balance support: Bilateral upper extremity supported Standing balance-Leahy Scale: Fair Standing balance  comment: wearing the bilateral CAM walkers will need some getting used to in order to balance properly                             Pertinent Vitals/Pain Pain Assessment: 0-10 Pain Score: 10-Worst pain ever Pain Location: bilateral knees and feet as well as "all over" Pain Descriptors / Indicators: Aching Pain Intervention(s): Limited activity within patient's tolerance;Premedicated before session;Ice applied (ice applied to knees)    Home Living Family/patient expects to be discharged to:: Skilled nursing facility Living Arrangements: Alone Available Help at Discharge: Friend(s);Available PRN/intermittently;Personal care attendant Type of Home: Apartment Home Access: Stairs to enter Entrance Stairs-Rails: Right Entrance Stairs-Number of Steps: 4 Home Layout: One level Home Equipment: Tub bench;Walker - 4 wheels;Walker - 2 wheels;Cane - single point      Prior Function Level of Independence: Independent with assistive device(s);Needs assistance   Gait / Transfers Assistance Needed: pt reports using rollator walker for mobility.  Assist needed to navigate 4 steps into apartment.  ADL's / Homemaking Assistance Needed: pt reports modified independence with dressing and min assist for bathing.  Reports she can transfer to shower using tub transfer bench with modified independence.  Pt requries assist for IADL needs- cooking, laundry, shopping, and per previous report this is pt's baseline from prior to CVA.         Hand Dominance   Dominant Hand: Right    Extremity/Trunk Assessment   Upper Extremity Assessment: Defer to OT evaluation           Lower Extremity Assessment: RLE deficits/detail;LLE deficits/detail RLE Deficits / Details: right knee is edemetous...she has minimal to no active motion in the right leg due to perceived pain.[name] KitchenMarland KitchenAA ROM to the knee is approx 20-60 degrees, ankle is positioned in inversion and foot can come to neutral with assistance LLE  Deficits / Details: minimal observable edema in the left knee but ROM and strength in the LLE is the same as for the RLE  Cervical / Trunk Assessment: Kyphotic  Communication   Communication: No difficulties  Cognition Arousal/Alertness: Awake/alert Behavior During Therapy: WFL for tasks assessed/performed Overall Cognitive Status: Within Functional Limits for tasks assessed                      General Comments      Exercises        Assessment/Plan    PT Assessment Patient needs continued PT services  PT Diagnosis Difficulty walking;Acute pain   PT Problem List Decreased strength;Decreased range of motion;Decreased mobility;Pain  PT Treatment Interventions Gait training;Functional mobility training;Therapeutic exercise   PT Goals (Current goals can be found in the Care Plan section) Acute Rehab PT Goals Patient Stated Goal: none stated    Frequency Min 5X/week   Barriers  to discharge Decreased caregiver support;Inaccessible home environment 4 steps to apartment, lives alone    Co-evaluation               End of Session Equipment Utilized During Treatment: Gait belt Activity Tolerance: Patient limited by pain Patient left: in bed;with call bell/phone within reach;with bed alarm set Nurse Communication: Mobility status    Functional Assessment Tool Used: clinical judgement Functional Limitation: Mobility: Walking and moving around Mobility: Walking and Moving Around Current Status (E6754): At least 80 percent but less than 100 percent impaired, limited or restricted Mobility: Walking and Moving Around Goal Status [phone]): At least 60 percent but less than 80 percent impaired, limited or restricted    Time: 1105-1203 PT Time Calculation (min) (ACUTE ONLY): 58 min   Charges:   PT Evaluation $Initial PT Evaluation Tier I: 1 Procedure PT Treatments $Therapeutic Activity: 8-22 mins   PT G Codes:   Functional Assessment Tool Used: clinical  judgement Functional Limitation: Mobility: Walking and moving around    [place] 05/25/2014, 12:21 PM

## 2014-05-25 NOTE — Progress Notes (Signed)
Pt c/o nausea after taking oxycodone. She states that she would like to be switched to hydrocodone because that what she takes at home. Dr.Lama called and placed a verbal order in for hydrocodone 1 tab q6h. George Hugh D, RN

## 2014-05-25 NOTE — Evaluation (Signed)
Occupational Therapy Evaluation Patient Details Name: Robin Blackburn MRN: [id number] DOB: 04-07-1946 Today's Date: 05/25/2014    History of Present Illness Robin Blackburn is a 68 y.o. female.  Pt fell yesterday in the morning at 05:30. Slipped out of bed and fell onto bottom. Crawled through appartment to try and get help. Home nurse found pt down at 10:30. Pt assisted at home for the next day until presentation to the [facility]. Pts pain adn immobility have continued to worsen. Unable to stand due to pain. Majority of is in knees and feet bilat. Pt w/ baseline arthritic pain from hips to ankles bilat. Hydrocodone w/ some improvement. Very little PO since initial injury. Scared to have to get up to pee so has had very little to drink.   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 Seen by Dr. Doran Durand last week who said foot still has not healed and will need to continue wearing her boot.      Clinical Impression   Pt is presenting to acute OT with above situation.  She demonstrates good strength and coordination in BUE.  Pt had significantly increased pain in BLE and declined mobilization this AM - RN provided pain medication.  Further assessment required to determine pt's current ADL status and recommended d/c venue.  Pt indicated she has also been considering the need for long term care.    Follow Up Recommendations   (to be determined)    Equipment Recommendations  None recommended by OT    Recommendations for Other Services       Precautions / Restrictions Precautions Precautions: Fall Restrictions Weight Bearing Restrictions: Yes Other Position/Activity Restrictions: Bilateral CAM boots      Mobility Bed Mobility               General bed mobility comments: Pt declined  Transfers                      Balance                                            ADL Overall ADL's : Needs  assistance/impaired Eating/Feeding: Modified independent                                     General ADL Comments: pt declinde mobility at this time, due to increased pain.   further assessment required to determine pt's current ADL functioning.     Vision                     Perception     Praxis      Pertinent Vitals/Pain Pain Assessment: 0-10 Pain Score: 10-Worst pain ever Pain Location: bilateral knees and feet Pain Intervention(s): Limited activity within patient's tolerance;RN gave pain meds during session     Hand Dominance Right   Extremity/Trunk Assessment Upper Extremity Assessment Upper Extremity Assessment: Overall WFL for tasks assessed (pt reports increased fatigue in RUE when using R)   Lower Extremity Assessment Lower Extremity Assessment: Defer to PT evaluation       Communication Communication Communication: No difficulties   Cognition Arousal/Alertness: Awake/alert Behavior During Therapy: WFL for tasks assessed/performed Overall Cognitive Status: Within Functional Limits for tasks assessed  General Comments       Exercises       Shoulder Instructions      Home Living Family/patient expects to be discharged to::  (to be determined) Living Arrangements: Alone Available Help at Discharge: Friend(s);Available PRN/intermittently;Personal care attendant Type of Home: Apartment Home Access: Stairs to enter Entrance Stairs-Number of Steps: 4 Entrance Stairs-Rails: Right Home Layout: One level     Bathroom Shower/Tub: Teacher, early years/pre: Handicapped height     Home Equipment: Tub bench;Walker - 4 wheels;Walker - 2 wheels;Cane - single point          Prior Functioning/Environment Level of Independence: Independent with assistive device(s);Needs assistance  Gait / Transfers Assistance Needed: pt reports using rollator walker for mobility.  Assist needed to navigate 4 steps  into apartment. ADL's / Homemaking Assistance Needed: pt reports modified independence with dressing and min assist for bathing.  Reports she can transfer to shower using tub transfer bench with modified independence.  Pt requries assist for IADL needs- cooking, laundry, shopping, and per previous report this is pt's baseline from prior to CVA.         OT Diagnosis:  (Decreased ADL status)   OT Problem List: Decreased activity tolerance (Decresed ADL status)   OT Treatment/Interventions: Self-care/ADL training;Therapeutic exercise;Energy conservation;Patient/family education;DME and/or AE instruction;Therapeutic activities;Balance training    OT Goals(Current goals can be found in the care plan section) Acute Rehab OT Goals Patient Stated Goal: No OT goals stated OT Goal Formulation: With patient Time For Goal Achievement: 06/08/14 Potential to Achieve Goals: Good ADL Goals Pt Will Perform Upper Body Dressing: with modified independence Pt Will Perform Lower Body Dressing: with modified independence Pt Will Transfer to Toilet: with supervision  OT Frequency: Min 2X/week   Barriers to D/C:            Co-evaluation              End of Session    Activity Tolerance: Patient tolerated treatment well;Patient limited by pain Patient left: in bed;with bed alarm set;with call bell/phone within reach   Time: 4920-1007 OT Time Calculation (min): 21 min Charges:  OT General Charges $OT Visit: 1 Procedure OT Evaluation $Initial OT Evaluation Tier I: 1 Procedure G-Codes:     Bea Graff Arrin Pintor, MS, OTR/L [place] [phone] 05/25/2014, 10:32 AM

## 2014-05-25 NOTE — Care Management Note (Signed)
    Page 1 of 1   05/26/2014     10:49:06 AM CARE MANAGEMENT NOTE 05/26/2014  Patient:  Robin Blackburn, Robin Blackburn   Account Number:  [id number]  Date Initiated:  05/25/2014  Documentation initiated by:  Theophilus Kinds  Subjective/Objective Assessment:   Pt admitted from home with fall, foot fracture. Pt lives alone but will probably need SNF at discharge. Pt has Blackburn cane, walker, shower chair for home use. Pt stated that she does not have good support at home     Action/Plan:   Awaiting recommendation from PT. CSW is aware that pt may need SNF. Will continue to follow for discharge planning needs.   Anticipated DC Date:  05/27/2014   Anticipated DC Plan:  SKILLED NURSING FACILITY  In-house referral  Clinical Social Worker      DC Planning Services  CM consult      Choice offered to / List presented to:             Status of service:  Completed, signed off Medicare Important Message given?   (If response is "NO", the following Medicare IM given date fields will be blank) Date Medicare IM given:   Medicare IM given by:   Date Additional Medicare IM given:   Additional Medicare IM given by:    Discharge Disposition:  [place]  Per UR Regulation:    If discussed at Long Length of Stay Meetings, dates discussed:    Comments:  05/26/14 [place], RN BSN CM Pt to discharge today to [facility]. CSW to arrange discharge to facility.  05/25/14 [place], RN BSN CM

## 2014-05-25 NOTE — Consult Note (Signed)
Reason for Consult:fracture of the left fifth metatarsal Referring Physician: Hospitalist  Robin Blackburn is an 68 y.o. female.  HPI: She had an injury at the nursing home and a fall and hurt her left foot.  I am asked to see her for the left foot only.  She is currently being treated by Dr. Doran Durand in [place] for right foot fracture and right knee pain.  X-rays show a nondisplaced fracture of the left fifth metatarsal base.  She has no other acute injuries.  She has a CAM walker in place on both feet.  She had CVA in September and is recovering from that with some right sided weakness. Past Medical History  Diagnosis Date  . Neuropathy   . Hypertension   . Anxiety   . Depression   . Acid reflux   . Rheumatoid arthritis   . Fibromyalgia   . Stroke 02/17/14  . CVA (cerebral infarction) 02/19/2014    Acute left thalamic    Past Surgical History  Procedure Laterality Date  . Back surgery    . Knee surgery    . Ankle reconstruction    . Abdominal hysterectomy    . Appendectomy    . Cholecystectomy      Family History  Problem Relation Age of Onset  . Hypertension Father   . Hypertension Brother   . Transient ischemic attack Father   . CVA Maternal Grandfather   . Leukemia Brother     Social History:  reports that she has never smoked. She does not have any smokeless tobacco history on file. She reports that she does not drink alcohol or use illicit drugs.  Allergies:  Allergies  Allergen Reactions  . Demerol [Meperidine] Anaphylaxis  . Hydromorphone Anaphylaxis  . Ciprofloxacin   . Iohexol   . Levaquin [Levofloxacin In D5w]   . Penicillins   . Percocet [Oxycodone-Acetaminophen]   . Sulfa Antibiotics   . Terramycin [Oxytetracycline]   . Morphine And Related Rash    Medications: I have reviewed the patient's current medications.  Results for orders placed or performed during the hospital encounter of 05/24/14 (from the past 48 hour(s))  Urinalysis, Routine w  reflex microscopic     Status: None   Collection Time: 05/24/14  1:42 PM  Result Value Ref Range   Color, Urine YELLOW YELLOW   APPearance CLEAR CLEAR   Specific Gravity, Urine 1.010 1.005 - 1.030   pH 5.5 5.0 - 8.0   Glucose, UA NEGATIVE NEGATIVE mg/dL   Hgb urine dipstick NEGATIVE NEGATIVE   Bilirubin Urine NEGATIVE NEGATIVE   Ketones, ur NEGATIVE NEGATIVE mg/dL   Protein, ur NEGATIVE NEGATIVE mg/dL   Urobilinogen, UA 0.2 0.0 - 1.0 mg/dL   Nitrite NEGATIVE NEGATIVE   Leukocytes, UA NEGATIVE NEGATIVE    Comment: MICROSCOPIC NOT DONE ON URINES WITH NEGATIVE PROTEIN, BLOOD, LEUKOCYTES, NITRITE, OR GLUCOSE <1000 mg/dL.  Comprehensive metabolic panel     Status: Abnormal   Collection Time: 05/24/14  1:45 PM  Result Value Ref Range   Sodium 129 (L) 137 - 147 mEq/L   Potassium 3.3 (L) 3.7 - 5.3 mEq/L   Chloride 85 (L) 96 - 112 mEq/L   CO2 31 19 - 32 mEq/L   Glucose, Bld 122 (H) 70 - 99 mg/dL   BUN 6 6 - 23 mg/dL   Creatinine, Ser 0.76 0.50 - 1.10 mg/dL   Calcium 9.1 8.4 - 10.5 mg/dL   Total Protein 7.1 6.0 - 8.3 g/dL   Albumin  3.3 (L) 3.5 - 5.2 g/dL   AST 17 0 - 37 U/L   ALT 17 0 - 35 U/L   Alkaline Phosphatase 90 39 - 117 U/L   Total Bilirubin 0.8 0.3 - 1.2 mg/dL   GFR calc non Af Amer 85 (L) >90 mL/min   GFR calc Af Amer >90 >90 mL/min    Comment: (NOTE) The eGFR has been calculated using the CKD EPI equation. This calculation has not been validated in all clinical situations. eGFR's persistently <90 mL/min signify possible Chronic Kidney Disease.    Anion gap 13 5 - 15  CBC with Differential     Status: Abnormal   Collection Time: 05/24/14  1:45 PM  Result Value Ref Range   WBC 13.1 (H) 4.0 - 10.5 K/uL   RBC 4.28 3.87 - 5.11 MIL/uL   Hemoglobin 13.3 12.0 - 15.0 g/dL   HCT 39.5 36.0 - 46.0 %   MCV 92.3 78.0 - 100.0 fL   MCH 31.1 26.0 - 34.0 pg   MCHC 33.7 30.0 - 36.0 g/dL   RDW 12.5 11.5 - 15.5 %   Platelets 399 150 - 400 K/uL   Neutrophils Relative % 75 43 - 77 %    Neutro Abs 9.8 (H) 1.7 - 7.7 K/uL   Lymphocytes Relative 16 12 - 46 %   Lymphs Abs 2.1 0.7 - 4.0 K/uL   Monocytes Relative 9 3 - 12 %   Monocytes Absolute 1.2 (H) 0.1 - 1.0 K/uL   Eosinophils Relative 0 0 - 5 %   Eosinophils Absolute 0.0 0.0 - 0.7 K/uL   Basophils Relative 0 0 - 1 %   Basophils Absolute 0.0 0.0 - 0.1 K/uL  CK     Status: Abnormal   Collection Time: 05/24/14  1:45 PM  Result Value Ref Range   Total CK 253 (H) 7 - 177 U/L  I-Stat CG4 Lactic Acid, ED     Status: None   Collection Time: 05/24/14  2:00 PM  Result Value Ref Range   Lactic Acid, Venous 1.20 0.5 - 2.2 mmol/L  Comprehensive metabolic panel     Status: Abnormal   Collection Time: 05/25/14  6:05 AM  Result Value Ref Range   Sodium 132 (L) 137 - 147 mEq/L   Potassium 3.2 (L) 3.7 - 5.3 mEq/L   Chloride 92 (L) 96 - 112 mEq/L    Comment: DELTA CHECK NOTED   CO2 29 19 - 32 mEq/L   Glucose, Bld 109 (H) 70 - 99 mg/dL   BUN 5 (L) 6 - 23 mg/dL   Creatinine, Ser 0.64 0.50 - 1.10 mg/dL   Calcium 8.6 8.4 - 10.5 mg/dL   Total Protein 6.4 6.0 - 8.3 g/dL   Albumin 2.9 (L) 3.5 - 5.2 g/dL   AST 15 0 - 37 U/L   ALT 14 0 - 35 U/L   Alkaline Phosphatase 82 39 - 117 U/L   Total Bilirubin 0.6 0.3 - 1.2 mg/dL   GFR calc non Af Amer 90 (L) >90 mL/min   GFR calc Af Amer >90 >90 mL/min    Comment: (NOTE) The eGFR has been calculated using the CKD EPI equation. This calculation has not been validated in all clinical situations. eGFR's persistently <90 mL/min signify possible Chronic Kidney Disease.    Anion gap 11 5 - 15  CBC     Status: None   Collection Time: 05/25/14  6:05 AM  Result Value Ref Range   WBC 10.3  4.0 - 10.5 K/uL   RBC 3.90 3.87 - 5.11 MIL/uL   Hemoglobin 12.2 12.0 - 15.0 g/dL   HCT 36.3 36.0 - 46.0 %   MCV 93.1 78.0 - 100.0 fL   MCH 31.3 26.0 - 34.0 pg   MCHC 33.6 30.0 - 36.0 g/dL   RDW 12.6 11.5 - 15.5 %   Platelets 377 150 - 400 K/uL    Dg Chest 2 View  05/24/2014   CLINICAL DATA:  [place]  out of bed, hip and knee pain  EXAM: CHEST  2 VIEW  COMPARISON:  Chest x-ray of 08/27/2010  FINDINGS: No active infiltrate or effusion is seen. Mediastinal and hilar contours are unremarkable. Cardiomegaly is stable. No acute bony abnormality is seen.  IMPRESSION: No active lung disease.  Stable cardiomegaly.   Electronically Signed   By: Ivar Drape M.D.   On: 05/24/2014 15:16   Dg Hip Bilateral W/pelvis  05/24/2014   CLINICAL DATA:  Slipped and fell yesterday, hip and knee pain  EXAM: BILATERAL HIP WITH PELVIS - 4+ VIEW  COMPARISON:  None.  FINDINGS: A view of the pelvis and views of both knees were obtained. There is mild degenerative joint disease of both hips with some loss of joint space. However, no acute fracture is seen. The pelvic rami are intact. The SI joints appear corticated.  IMPRESSION: Mild degenerative change of the hips for age.  No acute abnormality.   Electronically Signed   By: Ivar Drape M.D.   On: 05/24/2014 15:15   Dg Ankle Complete Left  05/24/2014   CLINICAL DATA:  Slid out of bed this morning with pain and bruising. Swelling on the lateral ankle.  EXAM: LEFT ANKLE COMPLETE - 3+ VIEW  COMPARISON:  02/20/2014 and left foot 05/24/2014  FINDINGS: There is a nondisplaced or minimally displaced fracture at the base of the fifth metatarsal bone. Ankle is located without a fracture. Again noted is a plantar calcaneal spur.  IMPRESSION: Fracture at the base of the fifth metatarsal bone.   Electronically Signed   By: Markus Daft M.D.   On: 05/24/2014 13:29   Ct Knee Right Wo Contrast  05/24/2014   CLINICAL DATA:  Fall today. Pain and swelling of the RIGHT knee. Knee effusion.  EXAM: CT OF THE RIGHT KNEE WITHOUT CONTRAST  TECHNIQUE: Multidetector CT imaging of the RIGHT knee was performed according to the standard protocol. Multiplanar CT image reconstructions were also generated.  COMPARISON:  Radiographs 05/24/2014.  FINDINGS: Large intermediate attenuation suprapatellar effusion is  present with layering high density material. The appearance is compatible with hemarthrosis. There is no fat identified within the effusion. There is severe tricompartmental osteoarthritis of the knee, worst in the medial and patellofemoral joints. Extensor mechanism appears intact. The medial meniscus is probably macerated with complete closure of the medial joint space.  There is no fracture of the tibial plateau. Fibular head and neck appear normal. Femoral condyles are normal. Diffuse mild fatty atrophy of the proximal leg musculature.  IMPRESSION: No acute osseous abnormality. Large effusion with dependently layering high attenuation material likely representing hemarthrosis.   Electronically Signed   By: Dereck Ligas M.D.   On: 05/24/2014 16:36   Dg Knee Complete 4 Views Left  05/24/2014   CLINICAL DATA:  [place] out of bed, knee pain  EXAM: LEFT KNEE - COMPLETE 4+ VIEW  COMPARISON:  None.  FINDINGS: There is mild tricompartmental degenerative joint disease of the left knee primarily involving the medial compartment  where there is more loss of joint space. No fracture is seen. No joint effusion is noted.  IMPRESSION: No acute abnormality. Mild tricompartmental degenerative joint disease.   Electronically Signed   By: Ivar Drape M.D.   On: 05/24/2014 15:20   Dg Knee Complete 4 Views Right  05/24/2014   CLINICAL DATA:  Pain and swelling in the right knee. Slid out of bed.  EXAM: RIGHT KNEE - COMPLETE 4+ VIEW  COMPARISON:  Knee MRI 07/20/2007  FINDINGS: There is medial joint space narrowing with osteophyte formations. There is a large suprapatellar joint effusion. Negative for an acute fracture or dislocation. Extensive degenerative changes at the patellofemoral compartment.  IMPRESSION: Moderate to severe osteoarthritis in the right knee. No acute bone abnormality.  Large joint effusion. The large joint effusion could be secondary to degenerative disease. If there is concern for an occult injury,  recommend for further evaluation with CT.   Electronically Signed   By: Markus Daft M.D.   On: 05/24/2014 13:34   Dg Foot Complete Left  05/24/2014   CLINICAL DATA:  [place] out of bed. Pain, bruising and swelling in the lateral foot.  EXAM: LEFT FOOT - COMPLETE 3+ VIEW  COMPARISON:  Left ankle 05/24/2014  FINDINGS: There is a minimally displaced fracture involving the base of the fifth metatarsal. There is a mild hallux valgus deformity. Prominent plantar calcaneal spur. Lateral foot soft tissue swelling.  IMPRESSION: Minimally displaced fracture at the base of the fifth metatarsal bone.   Electronically Signed   By: Markus Daft M.D.   On: 05/24/2014 13:48    Review of Systems  Musculoskeletal: Positive for falls (She hurt the left foot at the nursing home.  She has prior injury of the right foot.).  Neurological:       History of CVA and some right sided weakness   Blood pressure 130/64, pulse 72, temperature 98.4 F (36.9 C), temperature source Oral, resp. rate 18, height 5' 6"  (1.676 m), weight 84 kg (185 lb 3 oz), SpO2 98 %. Physical Exam  Constitutional: She is oriented to person, place, and time. She appears well-developed and well-nourished.  HENT:  Head: Normocephalic and atraumatic.  Eyes: Conjunctivae and EOM are normal. Pupils are equal, round, and reactive to light.  Neck: Normal range of motion. Neck supple.  Cardiovascular: Normal rate and intact distal pulses.   Respiratory: Effort normal.  GI: Soft.  Musculoskeletal: She exhibits tenderness (She has pain of the left lateral foot.  She has a CAM walker on the left and one for the right foot. ).  Neurological: She is alert and oriented to person, place, and time. She has normal reflexes.  Skin: Skin is warm and dry.  Psychiatric: She has a normal mood and affect. Her behavior is normal. Judgment and thought content normal.    Assessment/Plan: Nondisplaced fracture of base of the fifth metatarsal on the left.  She has a CAM  walker and continue this.  She may remove it in bed. She prefers to be followed by Dr. Doran Durand in [place] who is currently actively treating her right foot and right knee.   Robin Blackburn 05/25/2014, 4:55 PM

## 2014-05-25 NOTE — Progress Notes (Signed)
Patient/Family oriented to room. Information packet given to patient/family. Admission inpatient armband information verified with patient/family to include name and date of birth and placed on patient arm. Side rails up x 2, fall assessment and education completed with patient/family. Call light within reach. Patient/family able to voice and demonstrate understanding of unit orientation instructions  Dan Europe, RN

## 2014-05-25 NOTE — Progress Notes (Signed)
INITIAL NUTRITION ASSESSMENT  DOCUMENTATION CODES Per approved criteria  -Not Applicable   INTERVENTION: Advance diet as tolerated: Heart Healthy  NUTRITION DIAGNOSIS: None at this time   Goal: Pt to meet >/= 90% of their estimated nutrition needs    Monitor:  Po intake, labs and wt trends   Reason for Assessment: evaluate nutrition status  68 y.o. female  Admitting Dx: Fall  ASSESSMENT: Pt hx includes: neuropathy, rheumatoid arthritis, CVA and HLD. She feeds herself. Appetite is good (75%) and weight within usual range. Pt says she will likely go to SNF at discharge.   Nutrition focused exam: no physical signs of malnutrition observed  Height: Ht Readings from Last 1 Encounters:  05/24/14 5\' 6"  (1.676 m)    Weight: Wt Readings from Last 1 Encounters:  05/24/14 185 lb 3 oz (84 kg)    Ideal Body Weight: 130#  % Ideal Body Weight: 142%  Wt Readings from Last 10 Encounters:  05/24/14 185 lb 3 oz (84 kg)  02/19/14 199 lb 15.3 oz (90.7 kg)    Usual Body Weight: 185-195#   % Usual Body Weight: 100%  BMI:  Body mass index is 29.9 kg/(m^2). overweight  Estimated Nutritional Needs: Kcal: 1600-1800 Protein: 80-90 gr Fluid: 1.6-1.8 liters daily  Skin: intact  Diet Order:  Heart Healthy diet  EDUCATION NEEDS: -No education needs identified at this time   Intake/Output Summary (Last 24 hours) at 05/25/14 1413 Last data filed at 05/25/14 0830  Gross per 24 hour  Intake   1150 ml  Output   1000 ml  Net    150 ml    Last BM: 12/16  Labs:   Recent Labs Lab 05/24/14 1345 05/25/14 0605  NA 129* 132*  K 3.3* 3.2*  CL 85* 92*  CO2 31 29  BUN 6 5*  CREATININE 0.76 0.64  CALCIUM 9.1 8.6  GLUCOSE 122* 109*    CBG (last 3)  No results for input(s): GLUCAP in the last 72 hours.  Scheduled Meds: . clopidogrel  75 mg Oral QHS  . cycloSPORINE  1 drop Both Eyes BID  . heparin  5,000 Units Subcutaneous 3 times per day  . hydrochlorothiazide  25 mg  Oral Daily  . lidocaine  1 patch Transdermal Q24H  . pantoprazole  40 mg Oral Daily  . PARoxetine  10 mg Oral Daily  . potassium chloride SA  20 mEq Oral Daily  . pravastatin  10 mg Oral QHS  . senna  1 tablet Oral BID    Continuous Infusions: . sodium chloride 125 mL/hr at 05/24/14 1757  . sodium chloride 100 mL/hr at 05/25/14 1210    Past Medical History  Diagnosis Date  . Neuropathy   . Hypertension   . Anxiety   . Depression   . Acid reflux   . Rheumatoid arthritis   . Fibromyalgia   . Stroke 02/17/14  . CVA (cerebral infarction) 02/19/2014    Acute left thalamic    Past Surgical History  Procedure Laterality Date  . Back surgery    . Knee surgery    . Ankle reconstruction    . Abdominal hysterectomy    . Appendectomy    . Cholecystectomy      Colman Cater MS,RD,CSG,LDN Office: [phone] Pager: [phone]

## 2014-05-26 ENCOUNTER — Inpatient Hospital Stay
Admission: RE | Admit: 2014-05-26 | Discharge: 2015-04-10 | Disposition: A | Discharge status: Other Acute Inpatient Hospital | Payer: Medicare Other | Source: Ambulatory Visit | Attending: Internal Medicine | Admitting: Internal Medicine

## 2014-05-26 DIAGNOSIS — R609 Edema, unspecified: Secondary | ICD-10-CM

## 2014-05-26 DIAGNOSIS — M545 Low back pain: Secondary | ICD-10-CM

## 2014-05-26 DIAGNOSIS — M546 Pain in thoracic spine: Secondary | ICD-10-CM

## 2014-05-26 DIAGNOSIS — R0989 Other specified symptoms and signs involving the circulatory and respiratory systems: Principal | ICD-10-CM

## 2014-05-26 DIAGNOSIS — R52 Pain, unspecified: Secondary | ICD-10-CM

## 2014-05-26 MED ORDER — ALPRAZOLAM 1 MG PO TABS: 1.0000 mg | ORAL_TABLET | Freq: Four times a day (QID) | ORAL | Status: DC | PRN

## 2014-05-26 MED ORDER — HYDROCODONE-ACETAMINOPHEN 7.5-325 MG PO TABS
1.0000 | ORAL_TABLET | ORAL | Status: DC | PRN
  Administered 2014-05-26 (×2): 1 via ORAL
  Filled 2014-05-26 (×2): qty 1

## 2014-05-26 MED ORDER — HYDROCODONE-ACETAMINOPHEN 7.5-325 MG PO TABS: 1.0000 | ORAL_TABLET | Freq: Four times a day (QID) | ORAL | Status: DC | PRN

## 2014-05-26 NOTE — Clinical Social Work Placement (Signed)
Clinical Social Work Department CLINICAL SOCIAL WORK PLACEMENT NOTE 05/26/2014  Patient:  Robin Blackburn, Robin Blackburn  Account Number:  [id number] Admit date:  05/24/2014  Clinical Social Worker:  [name], LCSW  Date/time:  05/25/2014 01:44 PM  Clinical Social Work is seeking post-discharge placement for this patient at the following level of care:   [place]   (*CSW will update this form in Epic as items are completed)   05/25/2014  Patient/family provided with [place] Department of Clinical Social Work's list of facilities offering this level of care within the geographic area requested by the patient (or if unable, by the patient's family).  05/25/2014  Patient/family informed of their freedom to choose among providers that offer the needed level of care, that participate in Medicare, Medicaid or managed care program needed by the patient, have an available bed and are willing to accept the patient.  05/25/2014  Patient/family informed of [facility]' ownership interest in [facility], as well as of the fact that they are under no obligation to receive care at this facility.  PASARR submitted to EDS on  PASARR number received on   FL2 transmitted to all facilities in geographic area requested by pt/family on  05/25/2014 FL2 transmitted to all facilities within larger geographic area on   Patient informed that his/her managed care company has contracts with or will negotiate with  certain facilities, including the following:     Patient/family informed of bed offers received:  05/26/2014 Patient chooses bed at [facility] Physician recommends and patient chooses bed at  [facility]  Patient to be transferred to [facility] on  05/26/2014 Patient to be transferred to facility by RN Patient and family notified of transfer on 05/26/2014 Name of family member notified:  Edd Arbour- brother  The following physician request were entered in  Epic:   Additional Comments: Pt has existing pasarr.  [name], [place]

## 2014-05-26 NOTE — Progress Notes (Signed)
OT Cancellation Note  Patient Details Name: Robin Blackburn MRN: [id number] DOB: 23-Jan-1946   Cancelled Treatment:    Reason Eval/Treat Not Completed: Patient declined, no reason specified (Pt eating breakfast and declined therapy at this time.  Will re-attempt at later time.)   [name], MS, OTR/L [place] 05/26/2014, 9:05 AM

## 2014-05-26 NOTE — Clinical Social Work Note (Signed)
Pt accepts bed at [facility] and will d/c today. Pt and facility aware and agreeable. D/C summary to be faxed upon completion. Pt will transfer with RN. CSW spoke with pt's brother, Edd Arbour and he is aware of above.   Robin Blackburn, [place]

## 2014-05-26 NOTE — Progress Notes (Signed)
Occupational Therapy Treatment Patient Details Name: Robin Blackburn MRN: [id number] DOB: 1945-08-29 Today's Date: 05/26/2014    History of present illness Robin Blackburn is a 68 y.o. female.  Pt fell yesterday in the morning at 05:30. Slipped out of bed and fell onto bottom. Crawled through appartment to try and get help. Home nurse found pt down at 10:30. Pt assisted at home for the next day until presentation to the [place]. Pts pain adn immobility have continued to worsen. Unable to stand due to pain. Majority of is in knees and feet bilat. Pt w/ baseline arthritic pain from hips to ankles bilat. Hydrocodone w/ some improvement. Very little PO since initial injury. Scared to have to get up to pee so has had very little to drink.   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 Seen by Dr. Doran Durand last week who said foot still has not healed and will need to continue wearing her boot.      OT comments  Pt agreeable to sitting EOB during morning ADLs, but is still hesitant of standing.  Pt at modified independent level with seated grooming and eating ADLs.  Had some increased discomfort and difficulty secondary to IV in RUE.  Will continue to encourage pt toward out of bed activities.  Updated d/c plan to SNF, due to pt's difficulty with OOB activities.   Follow Up Recommendations  SNF    Equipment Recommendations  None recommended by OT    Recommendations for Other Services      Precautions / Restrictions Precautions Precautions: Fall Required Braces or Orthoses: Other Brace/Splint Other Brace/Splint: CAM walker bilaterally when out of bed Restrictions Weight Bearing Restrictions: Yes RLE Weight Bearing: Weight bearing as tolerated LLE Weight Bearing: Partial weight bearing       Mobility Bed Mobility Overal bed mobility: Needs Assistance Bed Mobility: Supine to Sit;Sit to Supine     Supine to sit: Min guard;HOB elevated Sit to supine: Min  assist   General bed mobility comments: assist needed for raising RLE up to bed  Transfers                      Balance Overall balance assessment: Needs assistance Sitting-balance support: No upper extremity supported;Feet supported Sitting balance-Leahy Scale: Good Sitting balance - Comments: Pt mantained good sitting balance throughout functional activites completed in sitting, with some reaching and leaning involved.                           ADL Overall ADL's : Needs assistance/impaired Eating/Feeding: Modified independent   Grooming: Modified independent;Sitting;Wash/dry face;Oral care;Applying deodorant;Brushing hair Grooming Details (indicate cue type and reason): slight increased difficulty due to IV in RUE                               General ADL Comments: Pt agreed to sit EOB this session for grooming tasks.  She was grossly modified independent with tasks presented, requiring extra time to complte.  Pt remains hesitant to stand.      Vision                     Perception     Praxis      Cognition   Behavior During Therapy: [facility] for tasks assessed/performed Overall Cognitive Status: Within Functional Limits for tasks assessed  Extremity/Trunk Assessment               Exercises     Shoulder Instructions       General Comments      Pertinent Vitals/ Pain       Pain Assessment: 0-10 Pain Score: 6  Pain Location: L foot and R knee Pain Descriptors / Indicators: Aching Pain Intervention(s): Limited activity within patient's tolerance;Premedicated before session  Home Living                                          Prior Functioning/Environment              Frequency Min 2X/week     Progress Toward Goals  OT Goals(current goals can now be found in the care plan section)  Progress towards OT goals: Progressing toward goals  Acute Rehab OT  Goals Patient Stated Goal: none stated OT Goal Formulation: With patient Time For Goal Achievement: 06/08/14 Potential to Achieve Goals: Good  Plan Discharge plan needs to be updated    Co-evaluation                 End of Session     Activity Tolerance Patient tolerated treatment well;Patient limited by pain   Patient Left in bed;with bed alarm set;with call bell/phone within reach   Nurse Communication          Time: 1610-9604 OT Time Calculation (min): 29 min  Charges: OT General Charges $OT Visit: 1 Procedure OT Treatments $Self Care/Home Management : 23-37 mins  Bea Graff Armya Westerhoff, MS, OTR/L [place] 05/26/2014, 10:18 AM

## 2014-05-26 NOTE — Progress Notes (Signed)
Pt discharged to [facility] today per Dr. Jerilee Hoh. Pt's IV site D/C'd and WDL. VSS.  Report called to [facility], nurse at [facility]. Verbalized understanding. Pt left floor via hospital bed in stable condition accompanied by two NT's.

## 2014-05-26 NOTE — Discharge Summary (Signed)
Physician Discharge Summary  Robin Blackburn ZOX:[id number] DOB: February 06, 1946 DOA: 05/24/2014  PCP: Glo Herring., MD  Admit date: 05/24/2014 Discharge date: 05/26/2014  Time spent: 45 minutes  Recommendations for Outpatient Follow-up:  -Will be discharged to SNF today. -Will need to follow up with Dr. Doran Durand in 2 weeks for her left fifth metatarsal fracture.   Discharge Diagnoses:  Principal Problem:   Fall Active Problems:   H/O: CVA (cerebrovascular accident)   Fracture of 5th metatarsal   Physical deconditioning   Essential hypertension   Neuropathy   Anxiety   Hyponatremia   Hypokalemia   Leukocytosis   Rheumatoid arthritis   HLD (hyperlipidemia)   Discharge Condition: Stable and improved  Filed Weights   05/24/14 2023  Weight: 84 kg (185 lb 3 oz)    History of present illness:  Robin Blackburn is a 68 y.o. female  Pt fell yesterday in the morning at 05:30. Slipped out of bed and fell onto bottom. Crawled through appartment to try and get help. Home nurse found pt down at 10:30. Pt assisted at home for the next day until presentation to the [facility]. Pts pain adn immobility have continued to worsen. Unable to stand due to pain. Majority of is in knees and feet bilat. Pt w/ baseline arthritic pain from hips to ankles bilat. Hydrocodone w/ some improvement. Very little PO since initial injury. Scared to have to get up to pee so has had very little to drink.   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 Seen by Dr. Doran Durand last week who said foot still has not healed and will need to continue wearing her boot.   Hospital Course:   Mechanical Fall -No LOC. -Has been evaluated by PT with recommendations for SNF.  Right 5th Metatarsal Fracture -Have requested ortho consult. -CAM boot has been ordered. Will need follow up with her orthopedist in [place] in 2 weeks.  RA -Has "pain all over". -Continue chronic pain meds  including vicodin.  HTN -Stable. -Continue home meds.  Hyponatremia -129 on admission. -Up to 132 on DC. -Continue IVF.  Procedures:  None   Consultations:  Ortho, Dr. Luna Glasgow  Discharge Instructions  Discharge Instructions    Diet - low sodium heart healthy    Complete by:  As directed      Increase activity slowly    Complete by:  As directed             Medication List    STOP taking these medications        aspirin 325 MG tablet     simvastatin 20 MG tablet  Commonly known as:  ZOCOR      TAKE these medications        ALPRAZolam 1 MG tablet  Commonly known as:  XANAX  Take 1 tablet (1 mg total) by mouth every 6 (six) hours as needed. anxiety     clopidogrel 75 MG tablet  Commonly known as:  PLAVIX  Take 75 mg by mouth at bedtime.     dicyclomine 20 MG tablet  Commonly known as:  BENTYL  Take 20 mg by mouth every 6 (six) hours as needed. Abdominal cramps     diphenoxylate-atropine 2.5-0.025 MG per tablet  Commonly known as:  LOMOTIL  Take 1 tablet by mouth 2 (two) times daily as needed for diarrhea or loose stools.     hydrochlorothiazide 12.5 MG capsule  Commonly known as:  MICROZIDE  Take 25 mg by mouth daily.     HYDROcodone-acetaminophen 7.5-325 MG per tablet  Commonly known as:  NORCO  Take 1 tablet by mouth 4 (four) times daily as needed.     lansoprazole 30 MG capsule  Commonly known as:  PREVACID  Take 30 mg by mouth 2 (two) times daily.     lidocaine 5 %  Commonly known as:  LIDODERM  Place 1 patch onto the skin daily. Remove & Discard patch within 12 hours or as directed by MD     methocarbamol 500 MG tablet  Commonly known as:  ROBAXIN  Take 1 tablet (500 mg total) by mouth every 6 (six) hours as needed for muscle spasms.     nystatin cream  Commonly known as:  MYCOSTATIN  Apply topically 2 (two) times daily.     ondansetron 4 MG tablet  Commonly known as:  ZOFRAN  Take 4 mg by mouth every 6 (six) hours as needed.      PARoxetine 20 MG tablet  Commonly known as:  PAXIL  Take 10 mg by mouth daily.     Potassium Chloride ER 20 MEQ Tbcr  Take 20 mEq by mouth daily.     pravastatin 10 MG tablet  Commonly known as:  PRAVACHOL  Take 10 mg by mouth at bedtime.     RESTASIS 0.05 % ophthalmic emulsion  Generic drug:  cycloSPORINE  Place 1 drop into both eyes 2 (two) times daily.     sodium chloride 0.65 % Soln nasal spray  Commonly known as:  OCEAN  Place 1 spray into both nostrils as needed for congestion.     zolpidem 5 MG tablet  Commonly known as:  AMBIEN  Take 10 mg by mouth at bedtime as needed for sleep.       Allergies  Allergen Reactions  . Demerol [Meperidine] Anaphylaxis  . Hydromorphone Anaphylaxis  . Ciprofloxacin   . Iohexol   . Levaquin [Levofloxacin In D5w]   . Penicillins   . Percocet [Oxycodone-Acetaminophen]   . Sulfa Antibiotics   . Terramycin [Oxytetracycline]   . Morphine And Related Rash       Follow-up Information    Follow up with Glo Herring., MD. Schedule an appointment as soon as possible for a visit in 2 weeks.   Specialty:  Internal Medicine   Contact information:   [address] [place] [address] [phone]       Follow up with Wylene Simmer, MD. Schedule an appointment as soon as possible for a visit in 2 weeks.   Specialty:  Orthopedic Surgery   Why:  follow up on left foot fracture   Contact information:   [address] [place] [address] [phone]        The results of significant diagnostics from this hospitalization (including imaging, microbiology, ancillary and laboratory) are listed below for reference.    Significant Diagnostic Studies: Dg Chest 2 View  05/24/2014   CLINICAL DATA:  [place] out of bed, hip and knee pain  EXAM: CHEST  2 VIEW  COMPARISON:  Chest x-ray of 08/27/2010  FINDINGS: No active infiltrate or effusion is seen. Mediastinal and hilar contours are unremarkable. Cardiomegaly is  stable. No acute bony abnormality is seen.  IMPRESSION: No active lung disease.  Stable cardiomegaly.   Electronically Signed   By: Ivar Drape M.D.   On: 05/24/2014 15:16   Dg Hip Bilateral W/pelvis  05/24/2014   CLINICAL DATA:  Slipped and fell yesterday, hip and knee pain  EXAM: BILATERAL HIP WITH PELVIS - 4+ VIEW  COMPARISON:  None.  FINDINGS: A view of the pelvis and views of both knees were obtained. There is mild degenerative joint disease of both hips with some loss of joint space. However, no acute fracture is seen. The pelvic rami are intact. The SI joints appear corticated.  IMPRESSION: Mild degenerative change of the hips for age.  No acute abnormality.   Electronically Signed   By: Ivar Drape M.D.   On: 05/24/2014 15:15   Dg Ankle Complete Left  05/24/2014   CLINICAL DATA:  Slid out of bed this morning with pain and bruising. Swelling on the lateral ankle.  EXAM: LEFT ANKLE COMPLETE - 3+ VIEW  COMPARISON:  02/20/2014 and left foot 05/24/2014  FINDINGS: There is a nondisplaced or minimally displaced fracture at the base of the fifth metatarsal bone. Ankle is located without a fracture. Again noted is a plantar calcaneal spur.  IMPRESSION: Fracture at the base of the fifth metatarsal bone.   Electronically Signed   By: Markus Daft M.D.   On: 05/24/2014 13:29   Ct Knee Right Wo Contrast  05/24/2014   CLINICAL DATA:  Fall today. Pain and swelling of the RIGHT knee. Knee effusion.  EXAM: CT OF THE RIGHT KNEE WITHOUT CONTRAST  TECHNIQUE: Multidetector CT imaging of the RIGHT knee was performed according to the standard protocol. Multiplanar CT image reconstructions were also generated.  COMPARISON:  Radiographs 05/24/2014.  FINDINGS: Large intermediate attenuation suprapatellar effusion is present with layering high density material. The appearance is compatible with hemarthrosis. There is no fat identified within the effusion. There is severe tricompartmental osteoarthritis of the knee, worst in  the medial and patellofemoral joints. Extensor mechanism appears intact. The medial meniscus is probably macerated with complete closure of the medial joint space.  There is no fracture of the tibial plateau. Fibular head and neck appear normal. Femoral condyles are normal. Diffuse mild fatty atrophy of the proximal leg musculature.  IMPRESSION: No acute osseous abnormality. Large effusion with dependently layering high attenuation material likely representing hemarthrosis.   Electronically Signed   By: Dereck Ligas M.D.   On: 05/24/2014 16:36   Dg Knee Complete 4 Views Left  05/24/2014   CLINICAL DATA:  [place] out of bed, knee pain  EXAM: LEFT KNEE - COMPLETE 4+ VIEW  COMPARISON:  None.  FINDINGS: There is mild tricompartmental degenerative joint disease of the left knee primarily involving the medial compartment where there is more loss of joint space. No fracture is seen. No joint effusion is noted.  IMPRESSION: No acute abnormality. Mild tricompartmental degenerative joint disease.   Electronically Signed   By: Ivar Drape M.D.   On: 05/24/2014 15:20   Dg Knee Complete 4 Views Right  05/24/2014   CLINICAL DATA:  Pain and swelling in the right knee. Slid out of bed.  EXAM: RIGHT KNEE - COMPLETE 4+ VIEW  COMPARISON:  Knee MRI 07/20/2007  FINDINGS: There is medial joint space narrowing with osteophyte formations. There is a large suprapatellar joint effusion. Negative for an acute fracture or dislocation. Extensive degenerative changes at the patellofemoral compartment.  IMPRESSION: Moderate to severe osteoarthritis in the right knee. No acute bone abnormality.  Large joint effusion. The large joint effusion could be secondary to degenerative disease. If there is concern for an occult injury, recommend for further evaluation with CT.   Electronically Signed   By: Quita Skye  Anselm Pancoast M.D.   On: 05/24/2014 13:34   Dg Foot Complete Left  05/24/2014   CLINICAL DATA:  [place] out of bed. Pain, bruising and swelling in  the lateral foot.  EXAM: LEFT FOOT - COMPLETE 3+ VIEW  COMPARISON:  Left ankle 05/24/2014  FINDINGS: There is a minimally displaced fracture involving the base of the fifth metatarsal. There is a mild hallux valgus deformity. Prominent plantar calcaneal spur. Lateral foot soft tissue swelling.  IMPRESSION: Minimally displaced fracture at the base of the fifth metatarsal bone.   Electronically Signed   By: Markus Daft M.D.   On: 05/24/2014 13:48    Microbiology: No results found for this or any previous visit (from the past 240 hour(s)).   Labs: Basic Metabolic Panel:  Recent Labs Lab 05/24/14 1345 05/25/14 0605  NA 129* 132*  K 3.3* 3.2*  CL 85* 92*  CO2 31 29  GLUCOSE 122* 109*  BUN 6 5*  CREATININE 0.76 0.64  CALCIUM 9.1 8.6   Liver Function Tests:  Recent Labs Lab 05/24/14 1345 05/25/14 0605  AST 17 15  ALT 17 14  ALKPHOS 90 82  BILITOT 0.8 0.6  PROT 7.1 6.4  ALBUMIN 3.3* 2.9*   No results for input(s): LIPASE, AMYLASE in the last 168 hours. No results for input(s): AMMONIA in the last 168 hours. CBC:  Recent Labs Lab 05/24/14 1345 05/25/14 0605  WBC 13.1* 10.3  NEUTROABS 9.8*  --   HGB 13.3 12.2  HCT 39.5 36.3  MCV 92.3 93.1  PLT 399 377   Cardiac Enzymes:  Recent Labs Lab 05/24/14 1345  CKTOTAL 253*   BNP: BNP (last 3 results) No results for input(s): PROBNP in the last 8760 hours. CBG: No results for input(s): GLUCAP in the last 168 hours.     SignedLelon Frohlich  [facility] Pager: [phone] 05/26/2014, 11:59 AM

## 2014-05-27 ENCOUNTER — Encounter: Payer: Self-pay | Admitting: Internal Medicine

## 2014-05-27 ENCOUNTER — Non-Acute Institutional Stay (SKILLED_NURSING_FACILITY): Payer: Medicare Other | Admitting: Internal Medicine

## 2014-05-27 DIAGNOSIS — S92302D Fracture of unspecified metatarsal bone(s), left foot, subsequent encounter for fracture with routine healing: Secondary | ICD-10-CM

## 2014-05-27 DIAGNOSIS — M069 Rheumatoid arthritis, unspecified: Secondary | ICD-10-CM

## 2014-05-27 DIAGNOSIS — I639 Cerebral infarction, unspecified: Secondary | ICD-10-CM

## 2014-05-27 DIAGNOSIS — S92301D Fracture of unspecified metatarsal bone(s), right foot, subsequent encounter for fracture with routine healing: Secondary | ICD-10-CM

## 2014-05-27 DIAGNOSIS — I10 Essential (primary) hypertension: Secondary | ICD-10-CM

## 2014-05-27 NOTE — Progress Notes (Signed)
Patient ID: Robin Blackburn, female   DOB: Jul 11, 1945, 68 y.o.   MRN: [id number]   This is an acute visit.  Level of care skilled.  Facility [facility].  Chief complaint-acute visit status post hospitalization status post fall with right fifth metatarsal fracture-.  History of present illness.  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  She was at home until the next day when she apparently had continued significant pain--of note she was continuing to wear the boot on her right foot secondary to the right metatarsal fracture.  In the hospital she was found to have a left metatarsal fracture this time  During her hospitalization she was evaluated by PT with recommendation for skilled nursing-  CAM boot was ordered with recommendation for follow-up with her orthopedist in 2 weeks.  This has been complicated  with a history of rheumatoid arthritis she continues on her chronic pain medications including Vicodin she can have this every 6 hours but was asking if this could be increased slightly since pain control only last about 4 hours according to patient.    .  In regards to CVA with right-sided weakness she appears to be doing well with this appears a fairly significant amount of strength on her right side      Past  medical history.  History of falls.  History CVA.  Fracture of fifth tarsal bilaterally.  Deconditioning.  Hypertension.  Neuropathy.  Anxiety.  Hyponatremia.  Hypokalemia.  Leukocytosis which appears  to have resolved.  Rheumatoid arthritis.  Hyperlipidemia  Family medical social history reviewed per discharge note on 05/26/2014.  Medications.  Xanax 1 mg every 6 hours when necessary.  Plavix 75 mg daily at bedtime.  Bentyl 20 mg every 6 hours when necessary.  Hydrochlorothiazide 25 mg daily.  Vicodin 7. 10/10/2023 milligrams 4 times a day when necessary.  Prevacid 30 mg twice a day.  Lidoderm patch apply to knee daily removal patch.  Robaxin 500 mg every 6 hours when necessary muscle spasms.  Nystatin cream twice a day.  Zofran 4 mg every 6 hours when necessary nausea or vomiting.  Paxil 10 mg daily.  Potassium 20 mEq daily.  Pravachol 10 mg daily at bedtime.  Ocean Spray nasal spray 1 spray both nostrils when necessary.  Ambien 10 mg daily at bedtime when necessary.  Review of systems.  In general does not complain of fever or chills.  Skin is not complaining of any rashes or itching.  Head ears eyes nose mouth and throat-does not complain of sore throat or visual changes.  Respiratory no complaints of shortness of breath or cough.  Cardiac does not complain of chest pain has fairly minimal lower extremity edema.  GI does not complaining of abdominal discomfort currently nausea or vomiting.  Does have a history of GERD.  As well as gas discomfort-she would like simethicone back.  GU-does not complain of dysuria.  Musculoskeletal does complain of pain especially right upper leg-says this has been somewhat chronic since her fall but  appears to gradually be getting a bit worse.  Neurologic does have history of neuropathy lower extremities bilaterally she says this is not new decreased touch sensation.  Psych does have a significant history of depression and anxiety that appears at this point apparently relatively well controlled.  Physical exam.  She is afebrile pulse is 62 respirations 20 blood pressure 111/59.    In general this is a  pleasant elderly female in no distress s.  Her skin is warm and dry--she does have a small amount abusing to her right lower thorax area-left breast area-also what appears to be  very small question carpet burn lateral left ankle    Eyes pupils appear reactive light acuity appears grossly intact.  Oropharynx clear mucous membranes moist.  Chest is clear to auscultation there is no labored breathing.  Heart is regular rate and rhythm without murmur gallop or rub she has fairly mild lower extremity edema bilaterally pedal pulses are intact as well as capillary refill.  Abdomen is somewhat obese soft nontender with positive bowel sounds.  Musculoskeletal she does have bruits applied to her feet bilaterally-again she appears to have fairly minimal edema I would say trace to both feet pedal pulses are intact-there is tenderness to palpation of her right upperthigh area she says this is getting somewhat worse some increased edema of her right knee versus the left with some tenderness she does have a pain patch applied.  Neurologic is grossly intact she has decreased touch sensation lower extremities bilaterally cranial nerves are grossly intact she does have some mild right-sided weakness which is chronic  Psych she is alert and oriented 3 appears a bit anxious which is her baseline.  Labs.  05/27/2014.  WBC 7.1 hemoglobin 11.4 platelets 382.  Sodium 138 potassium 4.3 BUN 7 creatinine 0.75.  .     .  .  Neurologic as stated above her right-sided weakness appears to be improving. Psych she is alert and oriented x3 pleasant and appropriate   Labs.  03/13/2014.  WBC 9.1 hemoglobin 11.3 platelets 459.  Sodium 131 potassium 4.3 BUN 7 creatinine 0.77.  05/25/2014  Liver function tests within normal limits--except albumin of 2.9   03/01/2014.  WBC 9.5 hemoglobin 12.0 platelets 630  Sodium 136 potassium 3.8 BUN 5 creatinine 0.56  .  Assessment and plan.   #1--history of  left fifth metatarsal fracture-with previous history of right fifth metatarsal fracture-she is on CAM boots bilaterally currently-- this will be followed by orthopedics-physical therapy has been recommended--she is complaining of significant pain-will increase the frequency of her Vicodin to every 4 hours when necessary-she also continues on a muscle relaxer-as well as Lidoderm patch.  She also complains of some right thigh discomfort she feels is increasing-I do note  per review of Epic she did have a CT of the knee earlier this week which showed a large effusion likely hemarthrosis Consider a venous Doppler of the area secondary to some mild edema of thigh-  -   .  #3-hypertension-this appears relatively well controlled on hydrochlorothiazide--so far have minimal readings-she continues on potassium as well I do note she had some mild hypokalemia in the hospital and this is being supplemented-this has normalized at 4.3 on lab done today--   #4 ahistory of anxiety-at this point appears stable she is receiving Xanax every 6 hours when necessary-.   #5-history of CVA-she continues on Plavix-she appears to have recovered fairly well from this-although still is somewhat weak in a fall risk.  #  6-history of depression-she continues on Paxil.  #7-history of gas discomfort-will restart simethicone when necessary.  #8-history of GERD she is on Prevacid.  #9-history of rheumatoid arthritis-again will continue with chronic pain medications including Vicodin-changes as noted above  Of note patient  asked to be restarted on her [place] and we will do that-she also would like an order for Biofreeze topical to right thigh area and will write an order to see if therapy can obtain this  [id number]-note greater than 45 minutes spent assessing patient-reviewing her chart-addressing her concerns-and coordinating and formulating a plan of care for numerous diagnoses-of note greater than 50% of time spent  coordinating plan of care with patient's input  -.    [name] Kitchen

## 2014-05-28 ENCOUNTER — Non-Acute Institutional Stay (SKILLED_NURSING_FACILITY): Payer: Medicare Other | Admitting: Internal Medicine

## 2014-05-28 DIAGNOSIS — S92302S Fracture of unspecified metatarsal bone(s), left foot, sequela: Secondary | ICD-10-CM

## 2014-05-28 DIAGNOSIS — I6990 Unspecified sequelae of unspecified cerebrovascular disease: Secondary | ICD-10-CM

## 2014-05-28 DIAGNOSIS — E871 Hypo-osmolality and hyponatremia: Secondary | ICD-10-CM

## 2014-05-29 ENCOUNTER — Other Ambulatory Visit: Payer: Self-pay | Admitting: Internal Medicine

## 2014-05-29 DIAGNOSIS — R609 Edema, unspecified: Secondary | ICD-10-CM

## 2014-05-29 DIAGNOSIS — M79604 Pain in right leg: Secondary | ICD-10-CM

## 2014-05-30 ENCOUNTER — Ambulatory Visit (HOSPITAL_COMMUNITY)
Admission: RE | Admit: 2014-05-30 | Discharge: 2014-05-30 | Disposition: A | Discharge status: Home / Self Care | Payer: Medicare Other | Source: Ambulatory Visit | Attending: Internal Medicine | Admitting: Internal Medicine

## 2014-05-30 DIAGNOSIS — M79604 Pain in right leg: Secondary | ICD-10-CM | POA: Insufficient documentation

## 2014-05-30 DIAGNOSIS — R609 Edema, unspecified: Secondary | ICD-10-CM

## 2014-05-30 NOTE — Progress Notes (Signed)
Patient ID: Robin Blackburn, female   DOB: [birth year], 68 y.o.   MRN: [id number]               HISTORY & PHYSICAL  DATE:  05/28/2014       FACILITY: [facility]    LEVEL OF CARE:   SNF   CHIEF COMPLAINT:  Admission to SNF, post stay at [facility], 05/24/2014 through 05/26/2014.    HISTORY OF PRESENT ILLNESS:  This is a patient whom we actually had in the building from September to mid October.  At that point, she had suffered an acute thalamic infarct and, after her arrival here, we discovered her to have a fracture of the right fifth metatarsal for which she was referred to Orthopedics.  She was discharged to her own apartment which apparently has four stairs to enter.    She has home health that she pays for privately including all of her outside activities, cleaning, etc.    She states she walks with a walker at home and has been doing fairly well with no recent falls.  However, on the day of admission, she slipped out of bed and fell.  She crawled around her apartment, unable to get back up.  Her home health worker found her later on in the morning.    She was discovered during this admission to have a minimally displaced fracture of the left fifth metatarsal and, as noted, has the original fracture that we diagnosed in September that has still not completely healed.  She has bilateral Cam boots.    Other medical issues in the hospital included hyponatremia of 129 on admission, with a potassium of 3.2.    She complains of pain in her right knee and right thigh.  I note that she had a CT scan of the right knee showing no acute osseous abnormality, with a large effusion felt to represent hemarthrosis.    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      Fracture of the right fifth metatarsal in September, now with fracture of the left fifth metatarsal.    Physical deconditioning.    Essential  hypertension.    Neuropathy.     Hyponatremia.    Hypokalemia.    History of rheumatoid arthritis, fibromyalgia and reflex sympathetic dystrophy, according to the patient.  Hyperlipidemia.     CURRENT MEDICATIONS:  Medication list is reviewed.            Xanax 1 tablet every 6 hours p.r.n.     Plavix 75 q.d.       Bentyl 20 q.6 hours p.r.n., abdominal cramps.    Lomotil 1 tablet two times daily as needed for diarrhea or loose stool.     Microzide 12.5 daily.    Norco 7.5/3.5 q.4 daily.    Prevacid 30 mg b.i.d. daily.    Lidoderm patch to the right knee.    Robaxin 500 q.6 p.r.n.     Zofran q.6 p.r.n.     Paxil 10 q.d.    K-dur 20 q.d.    Pravachol 10 q.d.     Restasis ophthalmic 1 drop b.i.d.    Sodium chloride nasal spray 1 spray, each nostril, b.i.d.    Ambien 10 mg q.h.s. p.r.n.     I note that aspirin and simvastatin were discontinued.    SOCIAL HISTORY:  HOUSING:  The patient tells me she lives in her own apartment.  There is apparently a four-stair entrance stairway that she needs to manage.   FUNCTIONAL STATUS:  She has in-home support which she pays for privately, according to her.    REVIEW OF SYSTEMS:     CHEST/RESPIRATORY:  No shortness of breath.  CARDIAC:   No chest pain.     GI:  No abdominal pain.   MUSCULOSKELETAL:  Is complaining of pain in the right thigh and right knee.     PHYSICAL EXAMINATION:                GENERAL APPEARANCE:  The patient is not in any distress.  Alert and orientated.   CHEST/RESPIRATORY:  Clear air entry bilaterally.   CARDIOVASCULAR:  CARDIAC:   Heart sounds are normal.  There are no murmurs.  She appears to be euvolemic.   GASTROINTESTINAL:  LIVER/SPLEEN/KIDNEYS:  No liver, no spleen.  No tenderness.   GENITOURINARY:  BLADDER:   No suprapubic tenderness.   MUSCULOSKELETAL:   EXTREMITIES:   BILATERAL LOWER EXTREMITIES:  She does indeed have a Lidoderm patch over the right knee.  There is an  effusion here.  Osteoarthritis of the left knee.   There is wasting of the quads on both sides but no fasciculations, right worse than left.   NEUROLOGICAL:    SENSATION/STRENGTH:  She has marked lower extremity weakness of the musculature.  How much of this is pure weakness versus pain issues, I am really unsure.  I would be more interested in seeing how she does in therapy.   VASCULAR:   ARTERIAL:  I removed the Cam boots.  Peripheral pulses are normal.  There is nothing that looks ominous here.     ASSESSMENT/PLAN:                     Fall.  The patient states this is the first fall she has had since she went home.  She suffered a new left fifth metatarsal fracture.  She could not get up after this fall.    History of remote left thalamic CVA.  Very little residual of this.    During the hospitalization in September, she had a right fifth metatarsal fracture which apparently has not completely healed.  She has bilateral Cam boots.    Right thigh, right knee pain.  She has hemarthrosis in the right knee, apparently by CT scan.  This is enough to cause very significant pain.  It was not aspirated.  Presumably, a soft tissue injury here.  Followed by Orthopedics.    Hyponatremia and hypokalemia.  The etiology here is unclear.  Presumably, this has already been ordered to be checked.    History of rheumatoid arthritis, fibromyalgia and reflex sympathetic dystrophy, according to the patient.  She has chronic pain issues, if I remember correctly from last time.  No active joints in terms of rheumatoid arthritis, although there is probably significant degenerative changes in both knees.    History of a neuropathy.  I did not have this on my problem list last time.     CPT CODE: 46962

## 2014-06-05 ENCOUNTER — Other Ambulatory Visit: Payer: Self-pay

## 2014-06-05 MED ORDER — HYDROCODONE-ACETAMINOPHEN 7.5-325 MG PO TABS: 1.0000 | ORAL_TABLET | Freq: Four times a day (QID) | ORAL | Status: DC | PRN

## 2014-06-05 NOTE — Telephone Encounter (Signed)
RX faxed to [facility] @ [phone]. Phone number [phone]  

## 2014-06-13 ENCOUNTER — Non-Acute Institutional Stay (SKILLED_NURSING_FACILITY): Payer: Medicare Other | Admitting: Internal Medicine

## 2014-06-13 DIAGNOSIS — E871 Hypo-osmolality and hyponatremia: Secondary | ICD-10-CM

## 2014-06-13 DIAGNOSIS — J328 Other chronic sinusitis: Secondary | ICD-10-CM

## 2014-06-13 NOTE — Progress Notes (Signed)
Patient ID: Robin Blackburn, female   DOB: [birth year], 69 y.o.   MRN: [id number]                      FACILITY: [facility]    LEVEL OF CARE:   SNF   CHIEF COMPLAINT: Acute visit secondary to head congestion-history sinusitis.    HISTORY OF PRESENT ILLNESS:  This is a patient whom we actually had in the building from September to mid October.  At that point, she had suffered an acute thalamic infarct and, after her arrival here, we discovered her to have a fracture of the right fifth metatarsal for which she was referred to Orthopedics.  She was discharged to her own apartment which apparently has four stairs to enter.   .    She states she walks with a walker at home and has been doing fairly well with no recent falls.  However, on the day of admission, she slipped out of bed and fell.  She crawled around her apartment, unable to get back up.  Her home health worker found her later on in the morning.    She was discovered during this admission to have a minimally displaced fracture of the left fifth metatarsal and, as noted, has the original fracture that we diagnosed in September that has still not completely healed.  She has bilateral Cam boots.    Other medical issues in the hospital included hyponatremia of 129 on admission, with a potassium of 3.2.   --This has normalized per recent labs  She states she has a history of somewhat chronic sinusitis-she complains of head congestion nasal drainage she says which occasionally occurs she says that she  resolves fairly expediently with a Z-Pak.  She has been afebrile does not complain of any increased shortness of breath.    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      Fracture of the right fifth metatarsal in September, now with fracture of the left fifth metatarsal.    Physical deconditioning.    Essential hypertension.    Neuropathy.      Hyponatremia.    Hypokalemia.    History of rheumatoid arthritis, fibromyalgia and reflex sympathetic dystrophy, according to the patient.  Hyperlipidemia.     CURRENT MEDICATIONS:  Medication list is reviewed.            Xanax 1 tablet every 6 hours p.r.n.     Plavix 75 q.d.       Bentyl 20 q.6 hours p.r.n., abdominal cramps.    Lomotil 1 tablet two times daily as needed for diarrhea or loose stool.     Microzide 12.5 daily.    Norco 7.5/3.5 q.4 daily.    Prevacid 30 mg b.i.d. daily.    Lidoderm patch to the right knee.    Robaxin 500 q.6 p.r.n.     Zofran q.6 p.r.n.     Paxil 10 q.d.    K-dur 20 q.d.    Pravachol 10 q.d.     Restasis ophthalmic 1 drop b.i.d.    Sodium chloride nasal spray 1 spray, each nostril, b.i.d.    Ambien 10 mg q.h.s. p.r.n.     I note that aspirin and simvastatin were discontinued.    SOCIAL HISTORY:                HOUSING:  The patient  tells me she lives in her own apartment.  There is apparently a four-stair entrance stairway that she needs to manage.   FUNCTIONAL STATUS:  She has in-home support which she pays for privately, according to her.    REVIEW OF SYSTEMS: Gen. complaint fever or chills.  Head ears eyes nose mouth and throat-does complain of some nasal and throat drainage does not complain of any visual changes or acute sore throat.       CHEST/RESPIRATORY:  No shortness of breath is not complaining of cough.  CARDIAC:   No chest pain.     GI:  No abdominal pain.   MUSCULOSKELETAL:  As extensive history of pain but this appears at this point fairly well controlled.  Neurologic does not complain of dizziness syncopal episodes does complain of a headache but this appears to be more sinus related.  Psych does have a history of anxiety     PHYSICAL EXAMINATION:  Temperature 98.4 pulse 68 respirations 18 blood pressure 129/66               GENERAL APPEARANCE:  The patient is not in any distress.  Alert and  orientated.  skin is warm and dry.  Head ears eyes nose mouth and throat-does have. Have some clear drainage of her oropharynx do not note any eye drainage or visual changes   There is some tenderness to palpation of her forehead  CHEST/RESPIRATORY:  Clear air entry bilaterally.   CARDIOVASCULAR:  CARDIAC:   Heart sounds are normal.  There are no murmurs.  She appears to be euvolemic.   GASTROINTESTINAL:  Abdomen is soft nontender with positive bowel sounds    .   MUSCULOSKELETAL:   EXTREMITIES:   BILATERAL LOWER EXTREMITIES:  She does  have a Lidoderm patch over the right knee.  There is an effusion here.  Osteoarthritis of the left knee.   There is wasting of the quads on both sides b   NEUROLOGICAL:    SENSATION/STRENGTH:  She has marked lower extremity weakness of the musculature.  She has boots placed on her lower extremities bilaterally.    . Labs.  05/29/2014.  Sodium 136 potassium 4.6 BUN 11 creatinine 0.85.  05/27/2014.  WBC 7.1 hemoglobin 11.4 platelets 382.       ASSESSMENT/PLAN:                     Suspected sinusitis-will treat with azithromycin 500 mg one day 2050 mg for 4 additional days-also saline nasal spray 1 spray twice a day 7 days to both nostrils-.    Monitor vital signs pulse ox every shift for 48 hours  .    History of remote left thalamic CVA.  Very little residual of this.      .    Hyponatremia and hypokalemia.  The etiology here is unclear. This has normalized per recent labs will update this.    [id number]           CPT CODE: 70017

## 2014-06-15 ENCOUNTER — Other Ambulatory Visit: Payer: Self-pay | Admitting: *Deleted

## 2014-06-15 MED ORDER — HYDROCODONE-ACETAMINOPHEN 5-325 MG PO TABS: ORAL_TABLET | ORAL | Status: DC

## 2014-06-15 NOTE — Telephone Encounter (Signed)
[facility] 

## 2014-06-17 ENCOUNTER — Encounter: Payer: Self-pay | Admitting: Internal Medicine

## 2014-06-17 DIAGNOSIS — J329 Chronic sinusitis, unspecified: Secondary | ICD-10-CM | POA: Insufficient documentation

## 2014-06-21 ENCOUNTER — Non-Acute Institutional Stay (SKILLED_NURSING_FACILITY): Payer: Medicare Other | Admitting: Internal Medicine

## 2014-06-21 ENCOUNTER — Ambulatory Visit (HOSPITAL_COMMUNITY): Payer: Medicare Other | Attending: Internal Medicine

## 2014-06-21 DIAGNOSIS — R0989 Other specified symptoms and signs involving the circulatory and respiratory systems: Secondary | ICD-10-CM | POA: Insufficient documentation

## 2014-06-21 DIAGNOSIS — E871 Hypo-osmolality and hyponatremia: Secondary | ICD-10-CM

## 2014-06-21 DIAGNOSIS — J328 Other chronic sinusitis: Secondary | ICD-10-CM

## 2014-06-21 NOTE — Progress Notes (Signed)
Patient ID: Robin Blackburn, female   DOB: Mar 20, 1946, 69 y.o.   MRN: [id number]                         FACILITY: [place]    LEVEL OF CARE:   SNF   CHIEF COMPLAINT: Acute visit secondary to continued head congestion-sinusitis symptoms.    HISTORY OF PRESENT ILLNESS:  Patient is a pleasant elderly resident was all recently current complaints of head congestion and drainage-states she does have some history of sinusitis which usually responds to azithromycin-patient said initially this improved with treatment but now appears to be somewhat returned-she states at times she will need a second dose of azithromycin she also is been receiving saline nasal spray which she says has given her relief in the past.  Currently vital signs are stable she does not complain of any acute shortness of breath but feels possibly some chest congestion which she did not complain of originally--and feels a head congestion and drainage has returned as well .  She has been afebrile.    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      Fracture of the right fifth metatarsal in September, now with fracture of the left fifth metatarsal.    Physical deconditioning.    Essential hypertension.    Neuropathy.     Hyponatremia.    Hypokalemia.    History of rheumatoid arthritis, fibromyalgia and reflex sympathetic dystrophy, according to the patient.  Hyperlipidemia.     CURRENT MEDICATIONS:  Medication list is reviewed.            Xanax 1 tablet every 6 hours p.r.n.     Plavix 75 q.d.       Bentyl 20 q.6 hours p.r.n., abdominal cramps.    Lomotil 1 tablet two times daily as needed for diarrhea or loose stool.     Microzide 12.5 daily.    Norco 7.5/3.5 q.4 daily.    Prevacid 30 mg b.i.d. daily.    Lidoderm patch to the right knee.    Robaxin 500 q.6 p.r.n.     Zofran q.6 p.r.n.     Paxil 10 q.d.     K-dur 20 q.d.    Pravachol 10 q.d.     Restasis ophthalmic 1 drop b.i.d.    Sodium chloride nasal spray 1 spray, each nostril, b.i.d.    Ambien 10 mg q.h.s. p.r.n.     I note that aspirin and simvastatin were discontinued.    SOCIAL HISTORY:                HOUSING:  The patient tells me she lives in her own apartment.  There is apparently a four-stair entrance stairway that she needs to manage.   FUNCTIONAL STATUS:  She has in-home support which she pays for privately, according to her.    REVIEW OF SYSTEMS: Gen.  no complaint fever or chills.  Head ears eyes nose mouth and throat-does complain of some nasal and throat drainage does not complain of any visual changes or acute sore throat.       CHEST/RESPIRATORY:  No shortness of breath--complains of feeling her chest is congested-apparently there is a cough at times  CARDIAC:   No chest pain.     GI:  No abdominal pain.   MUSCULOSKELETAL:   extensive history of pain but this  appears at this point fairly well controlled.  Neurologic does not complain of dizziness syncopal episodes does complain of a headache but this appears to be more sinus related.  Psych does have a history of anxiety     PHYSICAL EXAMINATION:  Temperature 98.4 pulse 79 respirations 20 blood pressure 143/90-108/60 in this range              GENERAL APPEARANCE:  The patient is not in any distress.  Alert and orientated.  skin is warm and dry.  Head ears eyes nose mouth and throat fairly minimal drainage in the throat clear not note any eye drainage or visual changes   There is some tenderness to palpation of her forehead  CHEST/RESPIRATORY:  Clear air entry bilaterally. But says it feels a little tight-congested when she takes a deep breath-there is no sign of labored breathing  CARDIOVASCULAR:  CARDIAC:   Heart sounds are normal.  There are no murmurs.  She appears to be euvolemic.   GASTROINTESTINAL:  Abdomen is soft nontender with positive bowel  sounds    .   MUSCULOSKELETAL:   EXTREMITIES:   BILATERAL LOWER EXTREMITIES:  She does  have a Lidoderm patch over the right knee.  There is an effusion here.  Osteoarthritis of the left knee.   There is wasting of the quads on both sides b   NEUROLOGICAL:    SENSATION/STRENGTH:  She has marked lower extremity weakness of the musculature.  She has boots placed on her lower extremities bilaterally.    . Labs.  06/14/2014.  WBC 8.5 hemoglobin 12.4 platelets 497.   sodium 134 potassium 3.8 BUN 8 creatinine 0.78  05/29/2014.  Sodium 136 potassium 4.6 BUN 11 creatinine 0.85.  05/27/2014.  WBC 7.1 hemoglobin 11.4 platelets 382.       ASSESSMENT/PLAN:                     Suspected sinusitis-patient apparently has somewhat chronic refractory sinusitis per her history will treat with another course of azithromycin also will add Mucinex 600 mg twice a day for 7 days monitor closely vital signs pulse ox every shift for 48 hours.    Also will check the checks x-ray secondary to patient feeling she has some chest congestion  -.    .      .    Hyponatremia and hypokalemia.  The etiology here is unclear this has largely normalized per recent labs sodium of 134 is fairly baseline.    [id number]           CPT CODE: 38882

## 2014-06-23 ENCOUNTER — Non-Acute Institutional Stay (SKILLED_NURSING_FACILITY): Payer: Medicare Other | Admitting: Internal Medicine

## 2014-06-23 DIAGNOSIS — J328 Other chronic sinusitis: Secondary | ICD-10-CM

## 2014-06-23 DIAGNOSIS — M5489 Other dorsalgia: Secondary | ICD-10-CM

## 2014-06-23 DIAGNOSIS — R300 Dysuria: Secondary | ICD-10-CM

## 2014-06-23 DIAGNOSIS — M545 Low back pain, unspecified: Secondary | ICD-10-CM

## 2014-06-23 NOTE — Progress Notes (Signed)
Patient ID: Robin Blackburn, female   DOB: July 15, 1945, 69 y.o.   MRN: [id number]                      FACILITY: [facility]    LEVEL OF CARE:   SNF   CHIEF COMPLAINT: Acute visit secondary to right back pain    HISTORY OF PRESENT ILLNESS:    Patient is a pleasant elderly resident who is here for rehabilitation after sustaining a fracture of the left mid metatarsal-she previously had one of the right fifth metatarsal--this is followed by orthopedics and she does wear a boot.  I recently saw her for suspected sinusitis she is completing another course of azithromycin is also on Mucinex she does not complain of symptoms today.  However she is complaining of back pain and right sided-she is receiving Vico 7.5-325 mg milligrams every 4 hours when necessary also Robaxin 500 mg every 6 hours when necessary muscle spasm pain.  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  AlsoL 5-S1 status post decompression with stenosis and bulging disc.  Also L4-L5 4 foramil narrowing   family medical social history  been reviewed. Per history and physical on 05/29/2015 .  Medications have been reviewed per [facility]  Review of systems.  In general does not complain of fever or chills.  Respiratory is being treated for sinusitis apparently this has improved somewhat with less congestion she does not complain of shortness of breath or chest congestion today.  Cardiac does not complain of chest pain.  GI is not complaining of abdominal pain nausea or vomiting  GU-does complain of intermittent pain with urination.  Muscle skeletal does complain of some right sided back pain apparently some radiation here into the leg she says this is not totally new but bothers her.  Neurologic as stated above does not complain of headache or dizziness--she does have a history of  neuropathy.  Psych she does have a significant history of anxiety.  Physical exam.  Temperature is 97.9 pulse 69 respirations 18 blood pressure 122/60.  In general this is a pleasant elderly female in no distress resting comfortably in bed.  Her skin is warm and dry.  Oropharynx clear mucous membranes moist.  Chest is clear there is no labored breathing.  Heart is regular rate and rhythm without murmur gallop or rub.  Abdomen is obese soft nontender.  GU does appear to have a small amount of suprapubic tenderness  Musculoskeletal-does have some tenderness to palpation of her lower right back I do not note any deformity she moves all extremities at baseline again she has the boots lower extremities-she is moving all her extremities 4.  There is questionable CV tenderness on the right  Neurologic appears grossly intact I do not see any lateralizing findings touch sensation appears grossly intact her speech is clear cranial nerves intact.  Psych she is alert and oriented slightly anxious but not overtly so.  Labs.  06/14/2013.  WBC 8.5 hemoglobin 12.4.  Sodium 134 potassium 3.8 BUN 8 creatinine 0.78.  #1-history of lower right back pain-will obtain updated x-rays of the thoracic lumbar and sacral spine-she is again on Vicodin as needed for pain continue to monitor this-I note she is also on Xanax and would be afraid of medication causing sedation so will not be aggressive at this point but will  have to be monitored she does have the Vicodin as needed every 4 hours   5-325 milligrams as well as a when necessary Robaxin 500 mg every 6 hours when necessary   Also since she is complaining some dysuria and back pain Will obtain a urinalysis and culture.  #2-sinusitis-this appears to be doing somewhat better she is again on azithromycin as well as Mucinex.  [id number]       .

## 2014-06-24 ENCOUNTER — Inpatient Hospital Stay (HOSPITAL_COMMUNITY): Payer: Medicare Other | Attending: Internal Medicine

## 2014-06-25 ENCOUNTER — Encounter: Payer: Self-pay | Admitting: Internal Medicine

## 2014-06-27 ENCOUNTER — Encounter: Payer: Self-pay | Admitting: Internal Medicine

## 2014-06-27 ENCOUNTER — Non-Acute Institutional Stay (SKILLED_NURSING_FACILITY): Payer: Medicare Other | Admitting: Internal Medicine

## 2014-06-27 DIAGNOSIS — M5489 Other dorsalgia: Secondary | ICD-10-CM

## 2014-06-27 DIAGNOSIS — R300 Dysuria: Secondary | ICD-10-CM

## 2014-06-27 DIAGNOSIS — M545 Low back pain, unspecified: Secondary | ICD-10-CM

## 2014-06-27 DIAGNOSIS — M5441 Lumbago with sciatica, right side: Secondary | ICD-10-CM

## 2014-06-27 DIAGNOSIS — J328 Other chronic sinusitis: Secondary | ICD-10-CM

## 2014-06-27 NOTE — Progress Notes (Signed)
Patient ID: Robin Blackburn, female   DOB: [birth year], 69 y.o.   MRN: [id number]               FACILITY: [facility]    LEVEL OF CARE:   SNF   CHIEF COMPLAINT: Acute visit follow-up back pain-possible dysuria-follow-up sinusitis    HISTORY OF PRESENT ILLNESS:    Patient is a pleasant elderly resident who is here for rehabilitation after sustaining a fracture of the left mid metatarsal-she previously had one of the right fifth metatarsal--this is followed by orthopedics and she does wear a boot.  I recently saw her for suspected sinusitis she is completing another course of azithromycin is also on Mucinex she does not complain of symptoms today says this is better.  However she has complainied of back pain and right sided-she is receiving Vicodin 7.5-325 mg milligrams every 4 hours when necessary also Robaxin 500 mg every 6 hours when necessary muscle spasm pain.  I did do x-rays which did not show any acute process and showed lumbar thoracic sacral advanced spondylosis.   She continues to complain of some discomfort-I discussed this with Dr. Dellia Nims and will reobtain a urinalysis and culture-most recentonly grew out 35,000 colonies of multiple pathogens.  However secondary to concerns of any possible kidney stone will reorder this and look for any blood pathology     family medical social history  been reviewed. Per history and physical on 05/29/2015 .  Medications have been reviewed per [facility]  Review of systems.  In general does not complain of fever or chills.  Respiratory is being treated for sinusitis apparently this has improved    Cardiac does not complain of chest pain.  GI is not complaining of abdominal pain nausea or vomiting  GU-does complain of intermittent pain with urination.  Muscle skeletal does complain of some right sided back pain apparently some radiation here into the leg she says this is not totally new continues to bother her and this is why I am  seeing her today.  Neurologic as stated above does not complain of headache or dizziness--she does have a history of neuropathy.  Psych she does have a significant history of anxiety.  Physical exam.  Temperature 98.1 pulse 71 respirations 20 blood pressure 144/80-120/60 in this range.  In general this is a pleasant elderly female in no distress resting comfortably in bed.  Her skin is warm and dry.  Oropharynx clear mucous membranes moist.  Chest is clear there is no labored breathing.  Heart is regular rate and rhythm without murmur gallop or rub.  Abdomen is obese soft nontender.  GU does appear to  Continue to have a small amount of suprapubic tenderness  Musculoskeletal-does have some tenderness to palpation of her lower right back I do not note any deformity she moves all extremities at baseline --she actually has her boots off this evening there is minimal edema of her feet bilaterally.  Continues to have possible  CV tenderness on the right  Neurologic appears grossly intact I do not see any lateralizing findings her speech is clear  Psych she is alert and oriented slightly anxious but not overtly so.  Labs.    06/14/2013.  WBC 8.5 hemoglobin 12.4. Platelets 497  Sodium 134 potassium 3.8 BUN 8 creatinine 0.78.  #1-history of lower right back pain-per patient this continues to be somewhat irritating-x-rays did not really show any acute process-I did discuss this fairly extensively with patient at bedside-I also discussed  issue with Dr. Dellia Nims via phone-again will reorder a urinalysis to look for any blood-which would lead to suspicions of possibly a kidney stone.  She did state that she does  Have apparently a history cystitis and questioned possibly starting Macrobid empirically-however she has just completed a double course of Z-Pak-would be concerned somewhat about overuse of antibiotics but will have to monitor this closely  Also will obtain a CBC with  differential and metabolic panel to look for any elevated white count or abnormalities  She is on Vicodin 7. 5-325 milligrams every 4 hours as needed and Robaxin when necessary I did encouraged take these that she can have them with some frequency-if not effective will readdress but somewhat concerned with oversedation here.  #2-sinusitis-this appears to be largely resolved per patient she continues to improve with the treatment of azithromycin as well as Mucinex which was recently added.     .  [id number]       .

## 2014-07-03 ENCOUNTER — Other Ambulatory Visit: Payer: Self-pay | Admitting: *Deleted

## 2014-07-03 MED ORDER — HYDROCODONE-ACETAMINOPHEN 7.5-325 MG PO TABS
ORAL_TABLET | ORAL | Status: DC
Start: 2014-07-03 — End: 2014-08-16

## 2014-07-03 NOTE — Telephone Encounter (Signed)
[facility] 

## 2014-07-05 ENCOUNTER — Encounter: Payer: Self-pay | Admitting: Internal Medicine

## 2014-07-05 ENCOUNTER — Non-Acute Institutional Stay (SKILLED_NURSING_FACILITY): Payer: Medicare Other | Admitting: Internal Medicine

## 2014-07-05 DIAGNOSIS — R300 Dysuria: Secondary | ICD-10-CM

## 2014-07-05 DIAGNOSIS — N3090 Cystitis, unspecified without hematuria: Secondary | ICD-10-CM

## 2014-07-05 DIAGNOSIS — F411 Generalized anxiety disorder: Secondary | ICD-10-CM

## 2014-07-05 DIAGNOSIS — G4700 Insomnia, unspecified: Secondary | ICD-10-CM

## 2014-07-05 NOTE — Progress Notes (Signed)
Patient ID: [name], female   DOB: 03/16/1946, 69 y.o.   MRN: [id number]   FACILITY: [facility]    LEVEL OF CARE:   SNF   CHIEF COMPLAINT: Acute visit follow-up dysuria   HISTORY OF PRESENT ILLNESS:    Patient is a pleasant elderly resident who is here for rehabilitation after sustaining a fracture of the left mid metatarsal-she previously had one of the right fifth metatarsal--this is followed by orthopedics and she does wear a boot.    However she has complainied of back pain and right sided-she is receiving Vicodin 7.5-325 mg milligrams every 4 hours when necessary also Robaxin 500 mg every 6 hours when necessary muscle spasm pain.  I did do x-rays which did not show any acute process and showed lumbar thoracic sacral advanced spondylosis.   She continues to complain of some discomfort-urine culture grew out only 25,000 colonies of multiple pathogens non-predominant urinalysis negative for an nitrite only 3-6 white blood cells no bacteria seen small leukocyte esterase  Per fairly extensive discussion with patient she states she has some history of cystitis but it does not usually show up in a urine culture-she says her primary care provider usuallyk provided Macrobid for 10 days and that this would resolve her symptoms  Patient also has a history of anxiety is on Xanax 1 mg 4 times a day as needed for anxiety pharmacy has suggested trying to reduce this to 0.5 mg 4 times a day when necessary and monitor.  She also is on Ambien 10 mg daily at bedtime recommendation is for females to be 5 mg at night.          family medical social history  been reviewed. Per history and physical on 05/29/2015 .  Medications have been reviewed per [facility]  Review of systems.  In general does not complain of fever or chills.  Respiratory is not complaining of cough or shortness of breath this afternoon   Cardiac does not complain of chest pain.  GI is not complaining of  abdominal pain nausea or vomiting  GU-does complain of intermittent pain -.  Muscle skeletal does complain of some right sided back pain apparently some radiation here into the leg she says this is not totally new continues to bother her .  Neurologic -- does not complain of headache or dizziness--she does have a history of neuropathy.--Is not complaining of insomnia  Psych she does have a significant history of anxiety. At this point appears relatively well controlled  Physical exam.  Temperature 97.3 pulse 74 respirations 20 blood pressure 130/68.  In general this is a pleasant elderly female in no distress resting comfortably in bed.  Her skin is warm and dry.  Oropharynx clear mucous membranes moist.  Chest is clear there is no labored breathing.  Heart is regular rate and rhythm without murmur gallop or rub.  Abdomen is obese soft nontender.  GU does appear to  Continue to have a small amount of suprapubic tenderness  Musculoskeletal-does have some tenderness to palpation of her lower right back I do not note any deformity she moves all extremities at baseline --.  Continues to have possible  CV tenderness on the right  Neurologic appears grossly intact I do not see any lateralizing findings her speech is clear  Psych she is alert and oriented slightly anxious but not overtly so.  Labs. As stated above in history of present illness.  1- 20th 2016.  WBC 8.5 hemoglobin 12.2  platelets 375.  Sodium 135 potassium 4.4 BUN 8 creatinine 0.8.     06/14/2013.  WBC 8.5 hemoglobin 12.4. Platelets 497  Sodium 134 potassium 3.8 BUN 8 creatinine 0.78.  #1-history of lower right back pain-per patient this continues to be somewhat irritating-x-rays did not really show any acute process-I did discuss this fairly extensively with patient at bedside-I also discussed issue with Dr. Dellia Nims via phone-I also attempted to review records in [place] but these are fairly minimal in  regards to her urinary issues.  Will start her on Macrobid 100 mg twice a day for 10 days since apparently she has received significant relief with this-again urine culture is quite benign-.  She did state that she does  Have apparently a history cystitis--that again results of Macrobid-I suspect at some point she will need a urology reconsult I did discuss this with her as well-she says she would like to wait till after she is discharged from facility to pursue this   On pharmacy is also left recommendation to reduce her Xanax dose from 1 mg 4 times a day to 0.5 mg a day-as well as reducing her Ambien at night to 5 mg daily at bedtime when necessary from 10 mg daily at bedtime when necessary patient says that he is okay for her for now and we'll do a trial course with the lower doses which I believe is encouraging-has also been a recommendation to possibly discontinue Prilosec and start an H2 blocker instead however patient states she does have a significant history of GERD and has failed previous dose changes and would like to stay on the Prilosec and we will do that as well.  Patient is also on an SSRI Paxil--which can cause hyponatremia pharmacy has alerted [place] to this however she does had a sodium done which was within normal limits at 135.  Patient does benefit from the Paxil has been on this apparently long-term.  [id number] note greater than 35 minutes spent discussing patient's concerns at bedside especially in regards to her urinary issues-review of her medical records-and discussion as well with Dr. Dellia Nims about possibility of starting an antibiotic this was somewhat of a challenging diagnoses and treatment plan-again with patient's input will start the [place] and monitor this--

## 2014-07-28 ENCOUNTER — Other Ambulatory Visit (HOSPITAL_COMMUNITY)
Admission: RE | Admit: 2014-07-28 | Discharge: 2014-07-28 | Disposition: A | Discharge status: Home / Self Care | Payer: Medicare Other | Source: Ambulatory Visit | Attending: Internal Medicine | Admitting: Internal Medicine

## 2014-07-28 ENCOUNTER — Non-Acute Institutional Stay (SKILLED_NURSING_FACILITY): Payer: Medicare Other | Admitting: Internal Medicine

## 2014-07-28 ENCOUNTER — Encounter: Payer: Self-pay | Admitting: Internal Medicine

## 2014-07-28 DIAGNOSIS — Z8673 Personal history of transient ischemic attack (TIA), and cerebral infarction without residual deficits: Secondary | ICD-10-CM

## 2014-07-28 DIAGNOSIS — M069 Rheumatoid arthritis, unspecified: Secondary | ICD-10-CM

## 2014-07-28 DIAGNOSIS — I10 Essential (primary) hypertension: Secondary | ICD-10-CM

## 2014-07-28 DIAGNOSIS — R300 Dysuria: Secondary | ICD-10-CM

## 2014-07-28 DIAGNOSIS — S92351D Displaced fracture of fifth metatarsal bone, right foot, subsequent encounter for fracture with routine healing: Secondary | ICD-10-CM

## 2014-07-28 LAB — URINE MICROSCOPIC-ADD ON

## 2014-07-28 LAB — URINALYSIS, ROUTINE W REFLEX MICROSCOPIC
Bilirubin Urine: NEGATIVE
Glucose, UA: NEGATIVE mg/dL
HGB URINE DIPSTICK: NEGATIVE
Ketones, ur: NEGATIVE mg/dL
Nitrite: NEGATIVE
PROTEIN: NEGATIVE mg/dL
Specific Gravity, Urine: 1.015 (ref 1.005–1.030)
UROBILINOGEN UA: 0.2 mg/dL (ref 0.0–1.0)
pH: 6.5 (ref 5.0–8.0)

## 2014-07-28 NOTE — Progress Notes (Signed)
Patient ID: Robin Blackburn, female   DOB: 09-08-1945, 69 y.o.   MRN: [id number]   FACILITY: [facility]    LEVEL OF CARE:   SNF   CHIEF COMPLAINT: Medical management of chronic medical conditions including bilateral metatarsal fractures-hypertension-depression-anxiety-chronic pain-rheumatoid arthritis.--History CVA    HISTORY OF PRESENT ILLNESS:  This is a patient whom we actually had in the building from September to mid October.  At that point, she had suffered an acute thalamic infarct and, after her arrival here, we discovered her to have a fracture of the right fifth metatarsal for which she was referred to Orthopedics.  She was discharged to her own apartment which apparently has four stairs to enter.    She has home health that she pays for privately including all of her outside activities, cleaning, etc.    She states she walks with a walker at home and has been doing fairly well with no recent falls.  However, on the day of admission, she slipped out of bed and fell.  She crawled around her apartment, unable to get back up.  Her home health worker found her later on in the morning.    She was discovered during this admission to have a minimally displaced fracture of the left fifth metatarsal and, as noted, has the original fracture that we diagnosed in September --.  These issues have been relatively stable and followed by orthopedics    During her stay here she has had some suspected sinusitis with responded to a course of antibiotics.  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      Fracture of the  right fifth metatarsal in September, now with fracture of the left fifth metatarsal.    Physical deconditioning.    Essential hypertension.    Neuropathy.     Hyponatremia.    Hypokalemia.    History of rheumatoid arthritis, fibromyalgia and reflex sympathetic dystrophy, according to the patient.  Hyperlipidemia.     CURRENT MEDICATIONS:  Medication list is reviewed.            Xanax 0.5 tablet every 6 hours p.r.n.     Plavix 75 q.d.       Bentyl 20 q.6 hours p.r.n., abdominal cramps.    Lomotil 1 tablet two times daily as needed for diarrhea or loose stool.     Microzide 12.5 daily.    Norco 7.5/3.5 q.4 daily.    Prevacid 30 mg b.i.d. daily.    Lidoderm patch to the right knee.    Robaxin 500 q.6 p.r.n.     Zofran q.6 p.r.n.     Paxil 10 q.d.    K-dur 20 q.d.    Pravachol 10 q.d.     Restasis ophthalmic 1 drop b.i.d.    Sodium chloride nasal spray 1 spray, each nostril, b.i.d.    Ambien 5 mg q.h.s. p.r.n.     I note that aspirin and simvastatin were discontinued.    SOCIAL HISTORY:  HOUSING:  The patient tells me she lives in her own apartment.  There is apparently a four-stair entrance stairway that she needs to manage.   FUNCTIONAL STATUS:  She has in-home support which she pays for privately, according to her.    REVIEW OF SYSTEMS: In general does not complain of fever chills.  Skin is not complaining of rashes or itching.  Head ears eyes nose mouth and throat does not complaining of sore throat or visual changes today.       CHEST/RESPIRATORY:  No shortness of breath.  CARDIAC:   No chest pain.     GI:  No abdominal pain--he  GU does complain of some suprapubic discomfort and back pain.   MUSCULOSKELETAL:  Currently complains of some back pain she attributes to cystitis-she has had x-rays which did not show any acute process Neurologic does not complain of numbness headache or dizziness currently.  Psych-does have a history of  anxiety this point appears to be relatively stable.     PHYSICAL EXAMINATION:  Temperature 97.5 pulse 78 respirations 20 blood pressure 136/66  Gen. this is a pleasant female in no distress lying comfortably in bed  Skin is warm and dry.  she has somewhat thinning hair but this does not appear overtly changed from previous exams  Eyes pupils appear reactive light sclera and conjunctiva are clear visual acuity appears intact.  Oropharynx is clear mucous membranes moist                  CHEST/RESPIRATORY:  Clear air entry bilaterally.   CARDIOVASCULAR:  CARDIAC:   Heart sounds are normal.  There are no murmurs.  She appears to be euvolemic.   GASTROINTESTINAL:  LIVER/SPLEEN/KIDNEYS:  No liver, no spleen.  No tenderness.   GENITOURINARY:  BLADDER:   mild suprapubic tenderness.   MUSCULOSKELETAL:   EXTREMITIES:   BILATERAL LOWER EXTREMITIES:  She has a boot applied to her right foot-has minimal lower extremity edema bilaterally other than arthritic changes do not see any changes-is able to move all her extremities 4  -with continued lower extremity weakness.  Neurologic I could not really appreciate any overt lateralizing findings she does have a history of CVA her speech is clear cranial nerves appear intact.  [name] Kitchen    Psych-she is alert and oriented pleasant and appropriate.  Labs.  06/28/2014.  WBC 8.5 hemoglobin 12.2 platelets 375.  Sodium 135 potassium 4.4 BUN 8 creatinine 0.8.  [name] Kitchen     ASSESSMENT/PLAN:                       .    History of remote left thalamic CVA.  Very little residual of this she continues on Plavix--.    History of metatarsal fractures bilaterally-this is followed by orthopedics--appears to be stable.      History of cystitis--dysuria-she has been followed by urology however she prefers her urology appointment be set up after her discharge from here-she has responded to [facility] in the past-she states that even if the culture does not grow out  bacteria she has been placed on Macrobid with relief-will empirically start Macrobid milligrams twice a day for 7 days and obtain a urine culture.    Hyponatremia and hypokalemia.  The etiology here is unclear. But appears to be stable during her stay here will recheck a metabolic panel.    History of rheumatoid arthritis, fibromyalgia and reflex sympathetic dystrophy, according to the patient.  She has chronic pain  issues,--currently controlled on current medications including Vicodin which is been recently increased to every 4 hours when necessary-she also has a Lidoderm patch.    Hypertension-at this point appears controlled she is on hydrochlorothiazide recent blood pressures 136/66-109/72.    GERD this appears stable on Prilosec she is no longer complaining symptoms with this will have to be monitored.    Anxiety this is been significant however appears to be stable on when necessary Xanax the dose was recently reduced secondary to pharmacy consult nonetheless she appears to have tolerated this fairly well.    Depression-at this point is stable on Plavix.    Insomnia she is on Ambien 5 mg daily at bedtime when necessary again this was reduced per pharmacy consult she does not complain of insomnia today.    Question hair loss-this does not appear to be an acute change but will check a TSH and check thyroid function as an initial study and monitor this  [id number]-of note greater than 35 minutes spent assessing patient-discussing her concerns at bedside-and coordinating and formulating a plan of care for numerous diagnoses-of note greater than 50% of time spent coordinating plan of care  .    History of a neuropathy.  I did not have this on my problem list last time.

## 2014-07-29 ENCOUNTER — Encounter (HOSPITAL_COMMUNITY)
Admission: RE | Admit: 2014-07-29 | Discharge: 2014-07-29 | Disposition: A | Discharge status: Home / Self Care | Payer: Medicare Other | Source: Skilled Nursing Facility | Attending: Internal Medicine | Admitting: Internal Medicine

## 2014-07-29 DIAGNOSIS — E785 Hyperlipidemia, unspecified: Secondary | ICD-10-CM | POA: Insufficient documentation

## 2014-07-29 DIAGNOSIS — F419 Anxiety disorder, unspecified: Secondary | ICD-10-CM | POA: Insufficient documentation

## 2014-07-29 DIAGNOSIS — I10 Essential (primary) hypertension: Secondary | ICD-10-CM | POA: Diagnosis present

## 2014-07-29 LAB — BASIC METABOLIC PANEL
ANION GAP: 8 (ref 5–15)
BUN: 8 mg/dL (ref 6–23)
CALCIUM: 8.6 mg/dL (ref 8.4–10.5)
CO2: 31 mmol/L (ref 19–32)
Chloride: 96 mmol/L (ref 96–112)
Creatinine, Ser: 0.82 mg/dL (ref 0.50–1.10)
GFR calc Af Amer: 83 mL/min — ABNORMAL LOW (ref >90)
GFR calc non Af Amer: 71 mL/min — ABNORMAL LOW (ref >90)
Glucose, Bld: 114 mg/dL — ABNORMAL HIGH (ref 70–99)
Potassium: 3.6 mmol/L (ref 3.5–5.1)
Sodium: 135 mmol/L (ref 135–145)

## 2014-07-29 LAB — CBC WITH DIFFERENTIAL/PLATELET
BASOS ABS: 0 10*3/uL (ref 0.0–0.1)
Basophils Relative: 0 % (ref 0–1)
EOS ABS: 0.2 10*3/uL (ref 0.0–0.7)
EOS PCT: 2 % (ref 0–5)
HCT: 34.4 % — ABNORMAL LOW (ref 36.0–46.0)
Hemoglobin: 10.8 g/dL — ABNORMAL LOW (ref 12.0–15.0)
LYMPHS ABS: 3.1 10*3/uL (ref 0.7–4.0)
Lymphocytes Relative: 34 % (ref 12–46)
MCH: 30 pg (ref 26.0–34.0)
MCHC: 31.4 g/dL (ref 30.0–36.0)
MCV: 95.6 fL (ref 78.0–100.0)
Monocytes Absolute: 1 10*3/uL (ref 0.1–1.0)
Monocytes Relative: 11 % (ref 3–12)
NEUTROS ABS: 4.9 10*3/uL (ref 1.7–7.7)
Neutrophils Relative %: 53 % (ref 43–77)
PLATELETS: 360 10*3/uL (ref 150–400)
RBC: 3.6 MIL/uL — AB (ref 3.87–5.11)
RDW: 13.3 % (ref 11.5–15.5)
WBC: 9.3 10*3/uL (ref 4.0–10.5)

## 2014-07-29 LAB — TSH: TSH: 1.463 u[IU]/mL (ref 0.350–4.500)

## 2014-07-30 LAB — URINE CULTURE

## 2014-08-07 ENCOUNTER — Encounter (HOSPITAL_COMMUNITY)
Admission: RE | Admit: 2014-08-07 | Discharge: 2014-08-07 | Disposition: A | Discharge status: Home / Self Care | Payer: Medicare Other | Source: Skilled Nursing Facility | Attending: Internal Medicine | Admitting: Internal Medicine

## 2014-08-07 DIAGNOSIS — I10 Essential (primary) hypertension: Secondary | ICD-10-CM | POA: Diagnosis not present

## 2014-08-07 LAB — CBC
HEMATOCRIT: 33.8 % — AB (ref 36.0–46.0)
Hemoglobin: 10.8 g/dL — ABNORMAL LOW (ref 12.0–15.0)
MCH: 30.1 pg (ref 26.0–34.0)
MCHC: 32 g/dL (ref 30.0–36.0)
MCV: 94.2 fL (ref 78.0–100.0)
Platelets: 392 10*3/uL (ref 150–400)
RBC: 3.59 MIL/uL — AB (ref 3.87–5.11)
RDW: 13.2 % (ref 11.5–15.5)
WBC: 9.7 10*3/uL (ref 4.0–10.5)

## 2014-08-08 ENCOUNTER — Non-Acute Institutional Stay (SKILLED_NURSING_FACILITY): Payer: Medicare Other | Admitting: Internal Medicine

## 2014-08-08 DIAGNOSIS — H109 Unspecified conjunctivitis: Secondary | ICD-10-CM | POA: Diagnosis not present

## 2014-08-08 NOTE — Progress Notes (Signed)
Patient ID: Robin Blackburn, female   DOB: 1945/06/17, 70 y.o.   MRN: [id number]   This is an acute visit.  Level care skilled.  Facility [facility].  Chief complaint-acute visit secondary to left eye irritation drainage or erythema.  History of present illness.  Patient is a pleasant 69 year old female who has been complaining of some left eye irritation apparently it was erythematous this morning felt somewhat irritated and had clear drainage-she does not complain of any visual changes or acute eye pain.  Family medical social history as been reviewed.  Medications have been reviewed per [facility].  Review of systems.  In general does not complain of any fever or chills  Skin is not complaining of any rashes or itching.  Eyes as noted above is complaining of some eye discomfort at times clear drainage as well as erythematous changes.  Nose-apparently at some times will have drainage intermittent area  Throat does not complain of a sore throat.  Her straight does not complain of shortness breath or cough.  Cardiac does not complaining of any chest pain.  Physical exam.  Temperature 98.0 pulse 78 respirations 18 blood pressure 98/64.  General this a pleasant elderly female in no distress resting comfortably in bed.  Her skin is warm and dry.  Eyes her left eye appears relatively clear this evening pupil is reactive visual acuity intact she does have some clear drainage and says the eye feels irritated-she says it was somewhat erythematous earlier today-I do not get a clear history of any exudate however.  Right eye appears unremarkable  Oropharynx is clear mucous membranes moist.  Chest is clear to auscultation no labored breathing.  Heart is regular rate and rhythm.  Musculoskeletal does have a boot applied to her right lower leg this is baseline continues with some lower extremity weakness.  Labs.  08/07/2014.  CBC 9.7 hemoglobin 10.8 platelets  392.  07/29/2014.  Sodium 135 potassium 3.6 BUN 8 creatinine 0.82.  TSH-1.403.  Assessment and plan.  Number 1 conjunctivitis at this point would suspect more allergic conjunctivitis with clear drainage intermitted erythema and irritated-type feeling.--Will start Patanol eyedrops 1 drop twice a day for 7 days and monitor for any increased erythema crusting pain or visual changes-.  [id number] .

## 2014-08-11 ENCOUNTER — Encounter: Payer: Self-pay | Admitting: Internal Medicine

## 2014-08-16 ENCOUNTER — Other Ambulatory Visit: Payer: Self-pay | Admitting: *Deleted

## 2014-08-16 MED ORDER — HYDROCODONE-ACETAMINOPHEN 7.5-325 MG PO TABS: ORAL_TABLET | ORAL | Status: DC

## 2014-08-18 ENCOUNTER — Non-Acute Institutional Stay (SKILLED_NURSING_FACILITY): Payer: Medicare Other | Admitting: Internal Medicine

## 2014-08-18 DIAGNOSIS — E871 Hypo-osmolality and hyponatremia: Secondary | ICD-10-CM

## 2014-08-18 DIAGNOSIS — J3089 Other allergic rhinitis: Secondary | ICD-10-CM | POA: Diagnosis not present

## 2014-08-18 DIAGNOSIS — M25572 Pain in left ankle and joints of left foot: Secondary | ICD-10-CM

## 2014-08-18 DIAGNOSIS — I639 Cerebral infarction, unspecified: Secondary | ICD-10-CM | POA: Diagnosis not present

## 2014-08-18 NOTE — Progress Notes (Signed)
Patient ID: Robin Blackburn, female   DOB: 12-Nov-1945, 69 y.o.   MRN: [id number]  .   [name] Kitchen This is a routine visit  ACILITY: [facility]:   SNF   CHIEF COMPLAINT: Medical management of chronic medical conditions including bilateral metatarsal fractures-hypertension-depression-anxiety-chronic pain-rheumatoid arthritis.--History CVA    HISTORY OF PRESENT ILLNESS:  This is a patient whom we actually had in the building from September to mid October.  At that point, she had suffered an acute thalamic infarct and, after her arrival here, we discovered her to have a fracture of the right fifth metatarsal for which she was referred to Orthopedics.  She was discharged to her own apartment which apparently has four stairs to enter.    She has home health that she pays for privately including all of her outside activities, cleaning, etc.    She states she walks with a walker at home and has been doing fairly well with no recent falls.  However, on the day of admission, she slipped out of bed and fell.  She crawled around her apartment, unable to get back up.  Her home health worker found her later on in the morning.    She was discovered during this admission to have a minimally displaced fracture of the left fifth metatarsal and, as noted, has the original fracture that we diagnosed in September --.  These issues have been relatively stable and followed by orthopedics    During her stay here she has had some suspected sinusitis with responded to a course of antibiotics.  She also is complaining of urinary issues stating she has chronic cystitis that responds to Jordan-  She also feels she is losing her hair she says this is been a gradual process is wondering if she has a thyroid imbalance--recent TSH is within normal limits the hair loss appears to not have worsened.  She is complaining of some left ankle pain this evening-she also says she has some sinus drainage she  attributes to allergic rhinitis and would like an antihistamine.  Vital signs remained stable.  She does have a history of hyponatremia most recent sodium was 135 on February 29 we will recheck this.  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      Fracture of the right fifth metatarsal in September, now with fracture of the left fifth metatarsal.    Physical deconditioning.    Essential hypertension.    Neuropathy.     Hyponatremia.    Hypokalemia.    History of rheumatoid arthritis, fibromyalgia and reflex sympathetic dystrophy, according to the patient.  Hyperlipidemia.     CURRENT MEDICATIONS:  Medication list is reviewed.            Xanax 0.5 tablet every 6 hours p.r.n.     Plavix 75 q.d.       Bentyl 20 q.6 hours p.r.n., abdominal cramps.    Lomotil 1 tablet two times daily as needed for diarrhea or loose stool.     Microzide 12.5 daily.    Norco 7.5/3.5 q.4 daily.    Prevacid 30 mg b.i.d. daily.    Lidoderm patch to the right knee.    Robaxin 500 q.6 p.r.n.     Zofran q.6 p.r.n.  Paxil 10 q.d.    K-dur 20 q.d.    Pravachol 10 q.d.     Restasis ophthalmic 1 drop b.i.d.    Sodium chloride nasal spray 1 spray, each nostril, b.i.d.    Ambien 5 mg q.h.s. p.r.n.     I note that aspirin and simvastatin were discontinued.    SOCIAL HISTORY:                HOUSING:  The patient tells me she lives in her own apartment.  There is apparently a four-stair entrance stairway that she needs to manage.   FUNCTIONAL STATUS:  She has in-home support which she pays for privately, according to her.    REVIEW OF SYSTEMS: In general does not complain of fever chills.  Skin is not complaining of rashes or itching.  Head ears eyes nose mouth and throat does not complaining of sore  throat or visual changes today--she does complain of some clear nasal drainage.       CHEST/RESPIRATORY:  No shortness of breath.  CARDIAC:   No chest pain.     GI:  No abdominal pain--he  GU does complain of some suprapubic discomfort and back pain.   MUSCULOSKELETAL: Complains of somewhat chronic back pain intermittently-does not complain of that this evening however she does state her left ankle hurts denies any recent history of trauma again she does have a history of the metatarsal fracture  Neurologic does not complain of numbness headache or dizziness currently.  Psych-does have a history of anxiety this point appears to be relatively stable.     PHYSICAL EXAMINATION:  Temperature 97.5 pulse 76 respirations 18 blood pressure 120/56  Gen. this is a pleasant female in no distress lying comfortably in bed  Skin is warm and dry.  she has somewhat thinning hair but this does not appear overtly changed from previous exams  Eyes pupils appear reactive light sclera and conjunctiva are clear visual acuity appears intact.  Oropharynx is clear mucous membranes moist   Nose could not really appreciate any active drainage but apparently she has had some                 CHEST/RESPIRATORY:  Clear air entry bilaterally. No labored breathing   CARDIOVASCULAR:  CARDIAC:   Heart sounds are normal.  There are no murmurs.  She appears to be euvolemic.   GASTROINTESTINAL:  LIVER/SPLEEN/KIDNEYS:  No liver, no spleen.  No tenderness.   GENITOURINARY:  BLADDER:   Could not really appreciate overt suprapubic tenderness this evening MUSCULOSKELETAL:   EXTREMITIES:   BILATERAL LOWER EXTREMITIES:  She has a boot applied to her right foot-has minimal lower extremiy edema bilaterally other than arthritic changes do not see any changes-is able to move all hetr extremities 4  -with continued lower extremity weakness Left ankle I did not note any sores wounds or deformity there is some mild tenderness  to palpation of the ankle area-pedal pulse is intact-I did not note any increased edema.  Neurologic I could not really appreciate any overt lateralizing findings she does have a history of CVA her speech is clear cranial nerves appear intact. Does have a history of fairly mild right-sided weakness her grip strength appears to be strong  .    Psych-she is alert and oriented pleasant and appropriate.  Labs.   08/07/2014.  WBC 9.7 hemoglobin 10.8 platelets 392.  Sodium 135 potassium 3.6 BUN 8 creatinine 0.82  06/28/2014.  WBC 8.5 hemoglobin 12.2 platelets 375.  Sodium 135 potassium 4.4 BUN 8 creatinine 0.8.  [name] Kitchen     ASSESSMENT/PLAN:                       .    History of remote left thalamic CVA.  Very little residual of this she continues on Plavix--.    History of metatarsal fractures bilaterally-this is followed by orthopedics--appears to be stable.      History of cystitis--dysuria-she has been followed by urology however she prefers her urology appointment be set up after her discharge from here-she has responded to [facility] in the   For suspected cystitis does not complain of pain this evening    Hyponatremia and hypokalemia.  The etiology here is unclear. But appears to be stable during her stay here will recheck a metabolic panel.    History of rheumatoid arthritis, fibromyalgia and reflex sympathetic dystrophy, according to the patient.  She has chronic pain issues,--currently controlled on current medications including Vicodin which is been recently increased to every 4 hours when necessary-she also has a Lidoderm patch.    Hypertension-at this point appears controlled she is on hydrochlorothiazide recent blood pressures 120/56-123/72-138/78    GERD this appears stable on Priotonix she is no longer complaining symptoms with this will have to be monitored.    Anxiety this is been significant however appears to be stable on when necessary Xanax the dose was  recently reduced secondary to pharmacy consult nonetheless she appears to have tolerated this fairly well.    Depression-at this point is stableon Paxil .    Insomnia she is on Ambien 5 mg daily at bedtime when necessary again this was reduced per pharmacy consult she does not complain of insomnia today.  Suspected allergic rhinitis we'll do a trial course Claritin 10 mg daily for 5 days and monitor.  Left ankle pain-physical exam appear to be quite benign will order an x-ray initially and have this monitored patient does not really report any recent history of trauma.  Of note Will update a CBC as well as basic metabolic panel for updated values  [id number]-of note greater than 35 minutes spent assessing patient-addressing her concerns-reviewing her chart-and coordinating and formulating a plan of care for numerous diagnoses-of note greater than 50% of time spent coordinating plan of care

## 2014-08-19 ENCOUNTER — Inpatient Hospital Stay (HOSPITAL_COMMUNITY): Payer: Medicare Other | Attending: Internal Medicine

## 2014-08-19 ENCOUNTER — Ambulatory Visit (HOSPITAL_COMMUNITY): Payer: Self-pay

## 2014-08-19 ENCOUNTER — Other Ambulatory Visit: Payer: Self-pay | Admitting: Internal Medicine

## 2014-08-19 DIAGNOSIS — R52 Pain, unspecified: Secondary | ICD-10-CM

## 2014-08-20 ENCOUNTER — Encounter: Payer: Self-pay | Admitting: Internal Medicine

## 2014-08-20 DIAGNOSIS — J309 Allergic rhinitis, unspecified: Secondary | ICD-10-CM | POA: Insufficient documentation

## 2014-08-21 ENCOUNTER — Encounter (HOSPITAL_COMMUNITY)
Admission: RE | Admit: 2014-08-21 | Discharge: 2014-08-21 | Disposition: A | Discharge status: Home / Self Care | Payer: Medicare Other | Source: Skilled Nursing Facility | Attending: Internal Medicine | Admitting: Internal Medicine

## 2014-08-21 DIAGNOSIS — E876 Hypokalemia: Secondary | ICD-10-CM | POA: Diagnosis not present

## 2014-08-21 DIAGNOSIS — I10 Essential (primary) hypertension: Secondary | ICD-10-CM | POA: Insufficient documentation

## 2014-08-21 DIAGNOSIS — E871 Hypo-osmolality and hyponatremia: Secondary | ICD-10-CM | POA: Diagnosis not present

## 2014-08-21 LAB — CBC WITH DIFFERENTIAL/PLATELET
BASOS ABS: 0.1 10*3/uL (ref 0.0–0.1)
Basophils Relative: 1 % (ref 0–1)
EOS ABS: 0.3 10*3/uL (ref 0.0–0.7)
EOS PCT: 3 % (ref 0–5)
HEMATOCRIT: 34.6 % — AB (ref 36.0–46.0)
Hemoglobin: 11.2 g/dL — ABNORMAL LOW (ref 12.0–15.0)
Lymphocytes Relative: 39 % (ref 12–46)
Lymphs Abs: 3.6 10*3/uL (ref 0.7–4.0)
MCH: 30.7 pg (ref 26.0–34.0)
MCHC: 32.4 g/dL (ref 30.0–36.0)
MCV: 94.8 fL (ref 78.0–100.0)
MONO ABS: 1 10*3/uL (ref 0.1–1.0)
Monocytes Relative: 11 % (ref 3–12)
Neutro Abs: 4.2 10*3/uL (ref 1.7–7.7)
Neutrophils Relative %: 46 % (ref 43–77)
Platelets: 386 10*3/uL (ref 150–400)
RBC: 3.65 MIL/uL — ABNORMAL LOW (ref 3.87–5.11)
RDW: 13.2 % (ref 11.5–15.5)
WBC: 9.2 10*3/uL (ref 4.0–10.5)

## 2014-08-21 LAB — BASIC METABOLIC PANEL
Anion gap: 3 — ABNORMAL LOW (ref 5–15)
BUN: 9 mg/dL (ref 6–23)
CALCIUM: 8.7 mg/dL (ref 8.4–10.5)
CO2: 35 mmol/L — AB (ref 19–32)
Chloride: 96 mmol/L (ref 96–112)
Creatinine, Ser: 0.78 mg/dL (ref 0.50–1.10)
GFR calc Af Amer: >90 mL/min (ref >90)
GFR calc non Af Amer: 83 mL/min — ABNORMAL LOW (ref >90)
GLUCOSE: 102 mg/dL — AB (ref 70–99)
Potassium: 3.7 mmol/L (ref 3.5–5.1)
Sodium: 134 mmol/L — ABNORMAL LOW (ref 135–145)

## 2014-09-08 ENCOUNTER — Non-Acute Institutional Stay (SKILLED_NURSING_FACILITY): Payer: Medicare Other | Admitting: Internal Medicine

## 2014-09-08 ENCOUNTER — Encounter (HOSPITAL_COMMUNITY)
Admission: AD | Admit: 2014-09-08 | Discharge: 2014-09-08 | Disposition: A | Discharge status: Home / Self Care | Payer: Medicare Other | Source: Skilled Nursing Facility | Attending: Internal Medicine | Admitting: Internal Medicine

## 2014-09-08 DIAGNOSIS — R197 Diarrhea, unspecified: Secondary | ICD-10-CM | POA: Diagnosis not present

## 2014-09-08 DIAGNOSIS — J3089 Other allergic rhinitis: Secondary | ICD-10-CM

## 2014-09-08 DIAGNOSIS — G8929 Other chronic pain: Secondary | ICD-10-CM

## 2014-09-08 DIAGNOSIS — R300 Dysuria: Secondary | ICD-10-CM

## 2014-09-08 LAB — URINALYSIS, ROUTINE W REFLEX MICROSCOPIC
Bilirubin Urine: NEGATIVE
Glucose, UA: NEGATIVE mg/dL
Ketones, ur: NEGATIVE mg/dL
Nitrite: NEGATIVE
Protein, ur: NEGATIVE mg/dL
Specific Gravity, Urine: 1.01 (ref 1.005–1.030)
Urobilinogen, UA: 0.2 mg/dL (ref 0.0–1.0)
pH: 7 (ref 5.0–8.0)

## 2014-09-08 LAB — URINE MICROSCOPIC-ADD ON

## 2014-09-08 NOTE — Progress Notes (Signed)
Patient ID: Robin Blackburn, female   DOB: 06-19-1945, 69 y.o.   MRN: [id number]       .   [name] Kitchen This is an acute visit  ACILITY: [facility]:   SNF   CHIEF COMPLAINT: Acute visit secondary to numerous issues including follow-up diarrhea-pain management-dysuria-tongue discomfort    HISTORY OF PRESENT ILLNESS:  This is a patient whom we actually had in the building from September to mid October.  At that point, she had suffered an acute thalamic infarct and, after her arrival here, we discovered her to have a fracture of the right fifth metatarsal for which she was referred to Orthopedics.  She was discharged to her own apartment which apparently has four stairs to enter.         She was discovered during this admission to have a minimally displaced fracture of the left fifth metatarsal and, as noted, has the original fracture that we diagnosed in September --.  These issues have been relatively stable and followed by orthopedics   does complain of some chronic pain and muscle discomfort-she is receiving Vicodin-7.5 325 every 4 hours when necessary-she is also receiving Robaxin 5 mg every 6 hours when necessary-she feels she could benefit from increased pain management-I had a fairly extensive discussion with her at bedside-at this point she is agreeable to increasing the Robaxin the 750 mg every 6 hours and we can monitor her pain.  She also states she's been having diarrhea the last couple days-is having some abdominal discomfort and feelst somewhat weaker-she says this is not a totally new occurrence there is been no nausea or vomiting-she does have a history of colitis in the past she says and hasdone well the past course of Flagyl--which apparently gives her significant relief when she has had similar symptoms in the past.  I note she is on Lomotil when necessary-she is also on Bentyl for abdominal cramps prn  She also complains of increased allergy symptoms  throat nasal drainage she says Clarinex has worked for her in the past she does not complain of any increased shortness of breath or cough at this time.  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      Fracture of the right fifth metatarsal in September, now with fracture of the left fifth metatarsal.    Physical deconditioning.    Essential hypertension.    Neuropathy.     Hyponatremia.    Hypokalemia.    History of rheumatoid arthritis, fibromyalgia and reflex sympathetic dystrophy, according to the patient.  Hyperlipidemia.     CURRENT MEDICATIONS:  Medication list is reviewed.            Xanax 0.5 tablet every 6 hours p.r.n.     Plavix 75 q.d.       Bentyl 20 q.6 hours p.r.n., abdominal cramps.    Lomotil 1 tablet two times daily as needed for diarrhea or loose stool.     Microzide 12.5 daily.    Norco 7.5/3.5 q.4 daily.    Prevacid 30 mg b.i.d. daily.    Lidoderm patch to the right knee.    Robaxin 500 q.6 p.r.n.     Zofran q.6 p.r.n.  Paxil 10 q.d.    K-dur 20 q.d.    Pravachol 10 q.d.     Restasis ophthalmic 1 drop b.i.d.    Sodium chloride nasal spray 1 spray, each nostril, b.i.d.    Ambien 5 mg q.h.s. p.r.n.     I note that aspirin and simvastatin were discontinued.    SOCIAL HISTORY:                HOUSING:  The patient tells me she lives in her own apartment.  There is apparently a four-stair entrance stairway that she needs to manage.   FUNCTIONAL STATUS:  She has in-home support which she pays for privately, according to her.    REVIEW OF SYSTEMS: In general does not complain of fever chills.  Skin is not complaining of rashes or itching.  Head ears eyes nose mouth and throat does not complaining of sore throat or visual changes today--she does  complain of some clear nasal and throat drainage-also says her tongue feels uncomfortable       CHEST/RESPIRATORY:  No shortness of breath. Or increased cough CARDIAC:   No chest pain.     GI:  Complains of diarrhea and abdominal discomfort-  GU does complain of some suprapubic discomfort and dysuria.   MUSCULOSKELETAL: Complains of somewhat chronic back and foot pain --discussion as noted above   Neurologic does not complain of numbness headache or dizziness currently.  Psych-does have a history of anxiety this point appears to be relatively stable will feel she would benefit from an increase in her Xanax-is currently 0.5 every 6 hours when necessary.     PHYSICAL EXAMINATION:  Temperature 98.5 pulse 77 respirations 20 blood pressure 129/68 all this appears relatively baseline  Gen. this is a pleasant female in no distress lying comfortably in bed  Skin is warm and dry.  she has somewhat thinning hair but this does not appear overtly changed from previous exams  Eyes pupils appear reactive light sclera and conjunctiva are clear visual acuity appears intact.  Oropharynx is clear mucous membranes moist possibly a small amount of clear drainage noted I could not really appreciate any lesions or coating of her tongue--  Nose could not really appreciate any active drainage but apparently she has had some                 CHEST/RESPIRATORY:  Clear air entry bilaterally. No labored breathing   CARDIOVASCULAR:   CARDIAC:   Heart sounds are normal.  There are no murmurs.  She appears to be euvolemic.   GASTROINTESTINAL: Abdomen is obese soft does not appear to be overtly tender although she does say it feels sore -bowel sounds are positive  LIVER/SPLEEN/KIDNEYS:  No liver, no spleen.  No tenderness.   GENITOURINARY:  BLADDER:   I did not note any bladder distention but she is complaining of some suprapubic tenderness MUSCULOSKELETAL:   EXTREMITIES:   BILATERAL LOWER EXTREMITIES:    does not have boots on bilaterally this evening-has minimal lower extremiy edema bilaterally other than arthritic changes do not see any changes-is able to move all hetr extremities 4  -with continued lower extremity weakness--I do not see any overt deformities of her feet or lower extremities  t-I did not note any increased edema I would say trace edema bilaterally lower extremities.  Neurologic I could not really appreciate any overt lateralizing findings she does have a history of CVA her speech is clear cranial nerves appear intact. Does have a history  of fairly mild right-sided weakness her grip strength appears to be strong  .    Psych-she is alert and oriented pleasant and appropriate.  Labs.  08/21/2014.  Sodium 134 potassium 3.7 BUN 9 creatinine 0.78.  WBC 9.2 hemoglobin 11.2 platelets 386   08/07/2014.  WBC 9.7 hemoglobin 10.8 platelets 392.  Sodium 135 potassium 3.6 BUN 8 creatinine 0.82  06/28/2014.  WBC 8.5 hemoglobin 12.2 platelets 375.  Sodium 135 potassium 4.4 BUN 8 creatinine 0.8.  [name] Kitchen     ASSESSMENT/PLAN:                     Diarrhea-patient says this is increased from her baseline is complaining of some abdominal discomfort she says in the past this has indicated colitis with her history-and has responded quickly to a course of Flagyl-.    We'll start this empirically and obtain a C. difficile culture as well as a metabolic panel--and CBC with differential  -continued nasal and drainage she says Clarinex has been the medication that really works best for her will start her on this for 5 days monitor.   chronic pain muscle spasms 3 rheumatoid arthritis-patient is on a significant dose of Vicodin as noted above-she is agreeable to increasing her Robaxin slightly and monitoring this will increase this to 750 mg every 6 hours when necessary and monitor.  History of dysuria cystitis-will check a urine culture at this point patient is agreeable to starting with  this.-tongue discomfort-will treat empirically with Magic mouth wall 4 times a day for 3 days and monitor.  -question increased anxiety-patient is agreeable to write for a psychiatric consult for follow-up of this she is on Xanax 0.5 every 6 hours when necessary.    .      .    History of remote left thalamic CVA.  Very little residual of this she continues on Plavix--.    History of metatarsal fractures bilaterally-this is followed by orthopedics--appears to be stable.         Hyponatremia and hypokalemia.  The etiology here is unclear. But appears to be stable during her stay here will recheck a metabolic panel.    History of rheumatoid arthritis, fibromyalgia and reflex sympathetic dystrophy, according to the patient.  She has chronic pain issues,-  Adjustments as noted above-she also has a Lidoderm patch    Hypertension-at this point appears controlled she is on hydrochlorothiazide recent blood pressures 129/68-138/71-142/76    GERD this appears stable on Pri\otonix she is no longer complaining symptoms with this will have to be monitored.  .    Depression-at this point is stableon Paxil .    Insomnia she is on Ambien 5 mg daily at bedtime when necessary again this was reduced per pharmacy consult she does not complain of insomnia today.  [id number] note greater than 40 minutes spent assessing patient-addressing her concerns at bedside-and coordinating and formulating a plan of care for numerous diagnoses-of note greater than 50% of time spent coordinating plan of care with extensive discussion with patient at bedside

## 2014-09-09 ENCOUNTER — Other Ambulatory Visit (HOSPITAL_COMMUNITY)
Admission: RE | Admit: 2014-09-09 | Discharge: 2014-09-09 | Disposition: A | Discharge status: Home / Self Care | Payer: Medicare Other | Source: Other Acute Inpatient Hospital | Attending: Internal Medicine | Admitting: Internal Medicine

## 2014-09-09 DIAGNOSIS — N3090 Cystitis, unspecified without hematuria: Secondary | ICD-10-CM | POA: Diagnosis not present

## 2014-09-09 DIAGNOSIS — J310 Chronic rhinitis: Secondary | ICD-10-CM | POA: Insufficient documentation

## 2014-09-09 DIAGNOSIS — R300 Dysuria: Secondary | ICD-10-CM | POA: Insufficient documentation

## 2014-09-09 LAB — CBC WITH DIFFERENTIAL/PLATELET
BASOS PCT: 1 % (ref 0–1)
Basophils Absolute: 0.1 10*3/uL (ref 0.0–0.1)
Eosinophils Absolute: 0.1 10*3/uL (ref 0.0–0.7)
Eosinophils Relative: 1 % (ref 0–5)
HEMATOCRIT: 41.3 % (ref 36.0–46.0)
Hemoglobin: 14 g/dL (ref 12.0–15.0)
Lymphocytes Relative: 23 % (ref 12–46)
Lymphs Abs: 2.6 10*3/uL (ref 0.7–4.0)
MCH: 31 pg (ref 26.0–34.0)
MCHC: 33.9 g/dL (ref 30.0–36.0)
MCV: 91.4 fL (ref 78.0–100.0)
MONOS PCT: 9 % (ref 3–12)
Monocytes Absolute: 1 10*3/uL (ref 0.1–1.0)
NEUTROS ABS: 7.6 10*3/uL (ref 1.7–7.7)
Neutrophils Relative %: 66 % (ref 43–77)
PLATELETS: 497 10*3/uL — AB (ref 150–400)
RBC: 4.52 MIL/uL (ref 3.87–5.11)
RDW: 12.7 % (ref 11.5–15.5)
WBC: 11.3 10*3/uL — AB (ref 4.0–10.5)

## 2014-09-09 LAB — COMPREHENSIVE METABOLIC PANEL
ALT: 14 U/L (ref 0–35)
AST: 22 U/L (ref 0–37)
Albumin: 3.9 g/dL (ref 3.5–5.2)
Alkaline Phosphatase: 86 U/L (ref 39–117)
Anion gap: 10 (ref 5–15)
BUN: >5 mg/dL — ABNORMAL LOW (ref 6–23)
CO2: 29 mmol/L (ref 19–32)
Calcium: 9.2 mg/dL (ref 8.4–10.5)
Chloride: 91 mmol/L — ABNORMAL LOW (ref 96–112)
Creatinine, Ser: 0.64 mg/dL (ref 0.50–1.10)
GFR calc non Af Amer: 89 mL/min — ABNORMAL LOW (ref >90)
Glucose, Bld: 110 mg/dL — ABNORMAL HIGH (ref 70–99)
Potassium: 3.5 mmol/L (ref 3.5–5.1)
Sodium: 130 mmol/L — ABNORMAL LOW (ref 135–145)
Total Bilirubin: 0.6 mg/dL (ref 0.3–1.2)
Total Protein: 7.7 g/dL (ref 6.0–8.3)

## 2014-09-09 LAB — CLOSTRIDIUM DIFFICILE BY PCR: CDIFFPCR: NEGATIVE

## 2014-09-10 ENCOUNTER — Encounter: Payer: Self-pay | Admitting: Internal Medicine

## 2014-09-11 LAB — URINE CULTURE

## 2014-09-13 ENCOUNTER — Other Ambulatory Visit: Payer: Self-pay | Admitting: *Deleted

## 2014-09-13 ENCOUNTER — Other Ambulatory Visit: Payer: Self-pay

## 2014-09-13 MED ORDER — ALPRAZOLAM ER 0.5 MG PO TB24: 0.5000 mg | ORAL_TABLET | Freq: Every day | ORAL | Status: DC

## 2014-09-13 MED ORDER — HYDROCODONE-ACETAMINOPHEN 7.5-325 MG PO TABS: ORAL_TABLET | ORAL | Status: DC

## 2014-09-13 MED ORDER — ALPRAZOLAM 0.5 MG PO TBDP: ORAL_TABLET | ORAL | Status: DC

## 2014-09-13 NOTE — Telephone Encounter (Signed)
RX Fax for [facility]@ [phone]  

## 2014-09-14 ENCOUNTER — Encounter (HOSPITAL_COMMUNITY)
Admission: AD | Admit: 2014-09-14 | Discharge: 2014-09-14 | Disposition: A | Discharge status: Home / Self Care | Payer: Medicare Other | Source: Skilled Nursing Facility | Attending: Internal Medicine | Admitting: Internal Medicine

## 2014-09-14 DIAGNOSIS — R300 Dysuria: Secondary | ICD-10-CM | POA: Diagnosis not present

## 2014-09-14 LAB — CBC WITH DIFFERENTIAL/PLATELET
BASOS ABS: 0.1 10*3/uL (ref 0.0–0.1)
Basophils Relative: 1 % (ref 0–1)
Eosinophils Absolute: 0.2 10*3/uL (ref 0.0–0.7)
Eosinophils Relative: 2 % (ref 0–5)
HCT: 36.7 % (ref 36.0–46.0)
Hemoglobin: 12.2 g/dL (ref 12.0–15.0)
LYMPHS ABS: 2.7 10*3/uL (ref 0.7–4.0)
Lymphocytes Relative: 29 % (ref 12–46)
MCH: 30.4 pg (ref 26.0–34.0)
MCHC: 33.2 g/dL (ref 30.0–36.0)
MCV: 91.5 fL (ref 78.0–100.0)
Monocytes Absolute: 1.1 10*3/uL — ABNORMAL HIGH (ref 0.1–1.0)
Monocytes Relative: 12 % (ref 3–12)
NEUTROS ABS: 5.3 10*3/uL (ref 1.7–7.7)
Neutrophils Relative %: 56 % (ref 43–77)
Platelets: 404 10*3/uL — ABNORMAL HIGH (ref 150–400)
RBC: 4.01 MIL/uL (ref 3.87–5.11)
RDW: 12.7 % (ref 11.5–15.5)
WBC: 9.3 10*3/uL (ref 4.0–10.5)

## 2014-09-14 LAB — BASIC METABOLIC PANEL
Anion gap: 8 (ref 5–15)
BUN: 9 mg/dL (ref 6–23)
CO2: 32 mmol/L (ref 19–32)
CREATININE: 0.78 mg/dL (ref 0.50–1.10)
Calcium: 8.7 mg/dL (ref 8.4–10.5)
Chloride: 92 mmol/L — ABNORMAL LOW (ref 96–112)
GFR calc Af Amer: >90 mL/min (ref >90)
GFR, EST NON AFRICAN AMERICAN: 83 mL/min — AB (ref >90)
Glucose, Bld: 110 mg/dL — ABNORMAL HIGH (ref 70–99)
Potassium: 4 mmol/L (ref 3.5–5.1)
Sodium: 132 mmol/L — ABNORMAL LOW (ref 135–145)

## 2014-09-14 LAB — CLOSTRIDIUM DIFFICILE BY PCR: Toxigenic C. Difficile by PCR: NEGATIVE

## 2014-09-16 ENCOUNTER — Encounter (HOSPITAL_COMMUNITY)
Admission: RE | Admit: 2014-09-16 | Discharge: 2014-09-16 | Disposition: A | Discharge status: Home / Self Care | Payer: Medicare Other | Source: Skilled Nursing Facility | Attending: Internal Medicine | Admitting: Internal Medicine

## 2014-09-16 DIAGNOSIS — R300 Dysuria: Secondary | ICD-10-CM | POA: Diagnosis not present

## 2014-09-16 LAB — CLOSTRIDIUM DIFFICILE BY PCR: Toxigenic C. Difficile by PCR: NEGATIVE

## 2014-09-21 ENCOUNTER — Encounter (HOSPITAL_COMMUNITY)
Admission: AD | Admit: 2014-09-21 | Discharge: 2014-09-21 | Disposition: A | Discharge status: Home / Self Care | Payer: Medicare Other | Source: Skilled Nursing Facility | Attending: Internal Medicine | Admitting: Internal Medicine

## 2014-10-06 ENCOUNTER — Encounter (HOSPITAL_COMMUNITY)
Admission: AD | Admit: 2014-10-06 | Discharge: 2014-10-06 | Disposition: A | Discharge status: Home / Self Care | Payer: Medicare Other | Source: Skilled Nursing Facility | Attending: Internal Medicine | Admitting: Internal Medicine

## 2014-10-09 ENCOUNTER — Encounter (HOSPITAL_COMMUNITY)
Admission: RE | Admit: 2014-10-09 | Discharge: 2014-10-09 | Disposition: A | Discharge status: Home / Self Care | Payer: Medicare Other | Source: Skilled Nursing Facility | Attending: Internal Medicine | Admitting: Internal Medicine

## 2014-10-09 DIAGNOSIS — D72829 Elevated white blood cell count, unspecified: Secondary | ICD-10-CM | POA: Diagnosis not present

## 2014-10-09 DIAGNOSIS — I10 Essential (primary) hypertension: Secondary | ICD-10-CM | POA: Diagnosis present

## 2014-10-09 LAB — BASIC METABOLIC PANEL
ANION GAP: 9 (ref 5–15)
BUN: 9 mg/dL (ref 6–20)
CO2: 32 mmol/L (ref 22–32)
CREATININE: 0.78 mg/dL (ref 0.44–1.00)
Calcium: 8.5 mg/dL — ABNORMAL LOW (ref 8.9–10.3)
Chloride: 91 mmol/L — ABNORMAL LOW (ref 101–111)
GFR calc non Af Amer: >60 mL/min (ref >60)
GLUCOSE: 105 mg/dL — AB (ref 70–99)
Potassium: 4.4 mmol/L (ref 3.5–5.1)
Sodium: 132 mmol/L — ABNORMAL LOW (ref 135–145)

## 2014-10-09 LAB — CBC WITH DIFFERENTIAL/PLATELET
BASOS PCT: 1 % (ref 0–1)
Basophils Absolute: 0.1 10*3/uL (ref 0.0–0.1)
EOS ABS: 0.1 10*3/uL (ref 0.0–0.7)
Eosinophils Relative: 2 % (ref 0–5)
HCT: 36.1 % (ref 36.0–46.0)
Hemoglobin: 11.7 g/dL — ABNORMAL LOW (ref 12.0–15.0)
Lymphocytes Relative: 28 % (ref 12–46)
Lymphs Abs: 2.3 10*3/uL (ref 0.7–4.0)
MCH: 29.8 pg (ref 26.0–34.0)
MCHC: 32.4 g/dL (ref 30.0–36.0)
MCV: 91.9 fL (ref 78.0–100.0)
MONO ABS: 1.5 10*3/uL — AB (ref 0.1–1.0)
MONOS PCT: 18 % — AB (ref 3–12)
NEUTROS ABS: 4.4 10*3/uL (ref 1.7–7.7)
NEUTROS PCT: 51 % (ref 43–77)
PLATELETS: 325 10*3/uL (ref 150–400)
RBC: 3.93 MIL/uL (ref 3.87–5.11)
RDW: 12.8 % (ref 11.5–15.5)
WBC: 8.4 10*3/uL (ref 4.0–10.5)

## 2014-10-11 ENCOUNTER — Non-Acute Institutional Stay (SKILLED_NURSING_FACILITY): Payer: Medicare Other | Admitting: Internal Medicine

## 2014-10-11 ENCOUNTER — Encounter: Payer: Self-pay | Admitting: Internal Medicine

## 2014-10-11 DIAGNOSIS — S92351D Displaced fracture of fifth metatarsal bone, right foot, subsequent encounter for fracture with routine healing: Secondary | ICD-10-CM | POA: Diagnosis not present

## 2014-10-11 DIAGNOSIS — R531 Weakness: Secondary | ICD-10-CM

## 2014-10-11 DIAGNOSIS — J3089 Other allergic rhinitis: Secondary | ICD-10-CM

## 2014-10-11 DIAGNOSIS — I10 Essential (primary) hypertension: Secondary | ICD-10-CM

## 2014-10-11 DIAGNOSIS — Z8673 Personal history of transient ischemic attack (TIA), and cerebral infarction without residual deficits: Secondary | ICD-10-CM

## 2014-10-11 DIAGNOSIS — M069 Rheumatoid arthritis, unspecified: Secondary | ICD-10-CM

## 2014-10-11 NOTE — Progress Notes (Signed)
Patient ID: Robin Blackburn, female   DOB: 1946/03/06, 69 y.o.   MRN: [id number]         .   [name] Kitchen This is a routine visit  ACILITY: [facility]:   SNF   CHIEF COMPLAINT: Medical management of chronic medical conditions including bilateral metatarsal fractures-hypertension-depression-anxiety-chronic pain-rheumatoid arthritis-history CVA      HISTORY OF PRESENT ILLNESS:  This is a patient whom we actually had in the building from September to mid October.  At that point, she had suffered an acute thalamic infarct and, after her arrival here, we discovered her to have a fracture of the right fifth metatarsal for which she was referred to Orthopedics.  She was discharged to her own apartment which apparently has four stairs to enter.         She was discovered during this admission to have a minimally displaced fracture of the left fifth metatarsal and, as noted, has the original fracture that we diagnosed in September --.  These issues have been relatively stable and followed by orthopedics   does complain of some chronic pain and muscle discomfort-she is receiving Vicodin-7.5 325 every 4 hours when necessary- She is also on Robaxin and I recently increase this to 7  50 mg every 6 hours when necessary-this appears to be helping some.  She also is completing course of azithromycin for suspected possible bronchitis she has responded well to this in the past she continues complain at times of some yellowish greenish nasal discharge although apparently this has improved-she says her breathing is stable  Patient also states that she feels generally weak although this is not really a new complaint-. I do note recent lab shows vitamin D level was low at 17.6.  She also would like her vitamin B-1 6 and 12 checked which we will do-also will recheck her TSH was within normal range back in February.  Patient does have a history of anxiety she is on Xanax as needed this  appears to be relatively stable.  In regards to depression she is on Paxil which appears to be stable as well.  She does have a history of hypertension she is on Microzide recent blood pressures appear satisfactory 110/65-128/73-136/80  Last month she did complain of some diarrhea so she was put empirically on a short course of Flagyl-C. 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      Fracture of the right fifth metatarsal in September, now with fracture of the left fifth metatarsal.    Physical deconditioning.    Essential hypertension.    Neuropathy.     Hyponatremia.    Hypokalemia.    History of rheumatoid arthritis, fibromyalgia and reflex sympathetic dystrophy, according to the patient.  Hyperlipidemia.     CURRENT MEDICATIONS:  Medication list is reviewed.            Xanax 1 mg tablet every 6 hours p.r.n.     Plavix 75 q.d.       Bentyl 20 q.6 hours p.r.n., abdominal cramps.    Lomotil 1 tablet two times daily as needed for diarrhea or loose stool.     Microzide 12.5 daily.    Norco 7.5/3.5 q.4 daily.    Prevacid  30 mg b.i.d. daily.    Lidoderm patch to the right knee.    Robaxin 750 mg q.6 p.r.n.     Zofran q.6 p.r.n.     Paxil 10 q.d.    K-dur 20 q.d.    Pravachol 10 q.d.     Restasis ophthalmic 1 drop b.i.d.    Sodium chloride nasal spray 1 spray, each nostril, b.i.d.    Ambien 5 mg q.h.s. p.r.n.     I note that aspirin and simvastatin were discontinued.    SOCIAL HISTORY:                HOUSING:  The patient tells me she lived in her own apartment.  There is apparently a four-stair entrance stairway that she needs to manage.   FUNCTIONAL STATUS:  She has in-home support which she pays for privately, according to her.    REVIEW OF SYSTEMS: In general does not complain of  fever chills.  Skin is not complaining of rashes or itching.  Head ears eyes nose mouth and throat does not complaining of sore throat or visual changes today--she does complain of some  nasal and throat drainag-also some head congestion       CHEST/RESPIRATORY:  No shortness of breath. Or increased cough--has occasional cough again recently completed azithromycin for suspected bronchitis CARDIAC:   No chest pain.     GI:  Does not complain of diarrhea or abdominal discomfort today  GU does not complain of suprapubic discomfort or dysuria.   MUSCULOSKELETAL: Does not overtly complain of pain today-however she does complain at times of what she describes as spasms more of her right arm-this apparently is intermittent   Neurologic does not complain of numbness headache or dizziness currently.  Psych-does have a history of anxiety this point appears to be relatively stable  I recently increased her Xanax back to 1 mg every 6 hours secondary to increased anxiety symptoms apparently this is helping.     PHYSICAL EXAMINATION:  Temperature 97.5 pulse 70 respirations 18 blood pressure 110/65 weight is 199.6  Gen. this is a pleasant female in no distress lying comfortably in bed  Skin is warm and dry.  she has somewhat thinning hair but this does not appear overtly changed from previous exams  Eyes pupils appear reactive light sclera and conjunctiva are clear visual acuity appears intact.  Oropharynx is clear mucous membranes moist possibly a small amount of clear drainage---  Nose could not really appreciate any active drainage                 CHEST/RESPIRATORY:  Clear air entry bilaterally. No labored breathing   CARDIOVASCULAR:   CARDIAC:   Heart sounds are normal.  There are no murmurs.  She appears to be euvolemic.   GASTROINTESTINAL: Abdomen is obese soft does not appear to be overtly tender -bowel sounds are positive  LIVER/SPLEEN/KIDNEYS:  No liver, no spleen.  No tenderness.     GENITOURINARY:  BLADDER:   I did not note any bladder distention or overt tenderness MUSCULOSKELETAL:   EXTREMITIES:   BILATERAL LOWER EXTREMITIES:    Moves both at baseline strength-she does have abrace right ankle area-she has minimal lower extremity edema pedal pulses are intact bilaterally I do not note any deformities  t-.  Neurologic I could not really appreciate any overt lateralizing findings she does have a history of CVA her speech is clear cranial nerves appear intact. Does have a history of fairly mild right-sided weakness  her grip strength appears to be strong --minimal deficit here .    Psych-she is alert and oriented pleasant and appropriate.  Labs.  10/09/2014.  WBC 8.4 hemoglobin 11.7 platelets 325.  Sodium 132 potassium 4.4 BUN 9 creatinine 0.78 CO2 32.  09/09/2014.  Liver function tests within normal limits.  07/29/2014-TSH within normal limits at 1.463.  Of note recent vitamin D level on April 14 was low at 17.6  08/21/2014.  Sodium 134 potassium 3.7 BUN 9 creatinine 0.78.  WBC 9.2 hemoglobin 11.2 platelets 386   08/07/2014.  WBC 9.7 hemoglobin 10.8 platelets 392.  Sodium 135 potassium 3.6 BUN 8 creatinine 0.82  06/28/2014.  WBC 8.5 hemoglobin 12.2 platelets 375.  Sodium 135 potassium 4.4 BUN 8 creatinine 0.8  02/20/2014  Magnesium level II.1.  Cholesterol 253 triglycerides 309H LDL 62-LDL 129.  .    .     ASSESSMENT/PLAN:                             -continued nasal and drainage --she did have a short course of Claritin apparently this helped will restart Claritin 10 mg a day for 7 days and monitor--is seems that drainage tends to persist on shorter courses    chronic pain muscle spasms -- rheumatoid arthritis-patient is on a significant dose of Vicodin as noted above- We have increased her Robaxin 750 mg every 6 hours previously apparently this is helping some-still continues at times to complain of spasms more so  right arm-it appears her electrolytes are stable will check a magnesium level     -question increased anxiety Her Xanax recently was increased back to 1 mg every 6 hours when necessary this appears to be helping     .    History of remote left thalamic CVA.  Very little residual of this she continues on Plavix--.    History of metatarsal fractures bilaterally-this is followed by orthopedics--appears to be stable.         Hyponatremia and hypokalemia.  The etiology here is unclear. But appears to be stable during her stay here --metabolic panel showed a sodium of 132 which is baseline for her-potassium of 4.4    History of rheumatoid arthritis, fibromyalgia and reflex sympathetic dystrophy, according to the patient.  She has chronic pain issues,-  Adjustments as noted above    Hypertension-at this point appears controlled she is on hydrochlorothiazide recent blood pressures 110/65-128/73-136/80    GERD this appears stable on Pri\otonix she is no longer complaining symptoms with this will have to be monitored.  [name] is been somewhat of a generalized chronic complaint-we will recheck her TSH it appears metabolic panel and CBC are within  baseline range-also will check vitamin B -1-6- and 12 per patient request  .    Depression-at this point is stableon Paxil .    Insomnia she is on Ambien 5 mg daily at bedtime when necessary again this was reduced per pharmacy consult she does not complain of insomnia today     vitamin D deficiency-will start her on supplementation-6000 units daily for 8 weeks and recheck vitamin D level  Hyperlipidemia-she is on pravastatin-will update lipid panel recent liver function tests were unremarkable  History of bronchitis-she appears to have responded fairly well to a course of azithromycin-would like to be judicious in use of antibiotics-at this point will monitor restart Claritin and see how she does with the drainage and head  congestion clinically appears  stable does not complain of increased shortness of breath she is afebrile--lungs sound clear    .  [id number] note greater than 45 minutes spent assessing patient-addressing her concerns at bedside-and coordinating and formulating a plan of care for numerous diagnoses-of note greater than 50% of time spent coordinating plan of care with extensive discussion with patient at bedside

## 2014-10-12 ENCOUNTER — Encounter (HOSPITAL_COMMUNITY)
Admission: RE | Admit: 2014-10-12 | Discharge: 2014-10-12 | Disposition: A | Discharge status: Home / Self Care | Payer: Medicare Other | Source: Skilled Nursing Facility | Attending: Internal Medicine | Admitting: Internal Medicine

## 2014-10-12 DIAGNOSIS — I10 Essential (primary) hypertension: Secondary | ICD-10-CM | POA: Diagnosis not present

## 2014-10-12 LAB — LIPID PANEL
CHOL/HDL RATIO: 3.9 ratio
Cholesterol: 173 mg/dL (ref 0–200)
HDL: 44 mg/dL (ref >40)
LDL Cholesterol: 88 mg/dL (ref 0–99)
Triglycerides: 205 mg/dL — ABNORMAL HIGH (ref <150)
VLDL: 41 mg/dL — ABNORMAL HIGH (ref 0–40)

## 2014-10-12 LAB — TSH: TSH: 1.111 u[IU]/mL (ref 0.350–4.500)

## 2014-10-12 LAB — MAGNESIUM: MAGNESIUM: 2 mg/dL (ref 1.7–2.4)

## 2014-10-12 LAB — VITAMIN B12: Vitamin B-12: 357 pg/mL (ref 180–914)

## 2014-10-14 LAB — VITAMIN B1: VITAMIN B1 (THIAMINE): 97.5 nmol/L (ref 66.5–200.0)

## 2014-10-16 ENCOUNTER — Other Ambulatory Visit: Payer: Self-pay

## 2014-10-16 MED ORDER — HYDROCODONE-ACETAMINOPHEN 7.5-325 MG PO TABS: ORAL_TABLET | ORAL | Status: DC

## 2014-10-16 NOTE — Telephone Encounter (Signed)
RX faxed to [facility] @ [phone]. Phone number [phone]  

## 2014-10-17 LAB — VITAMIN B6: VITAMIN B6: 5.9 ng/mL (ref 2.1–21.7)

## 2014-10-25 ENCOUNTER — Non-Acute Institutional Stay (SKILLED_NURSING_FACILITY): Payer: Medicare Other | Admitting: Internal Medicine

## 2014-10-25 DIAGNOSIS — M79621 Pain in right upper arm: Secondary | ICD-10-CM | POA: Diagnosis not present

## 2014-10-25 DIAGNOSIS — R0981 Nasal congestion: Secondary | ICD-10-CM | POA: Diagnosis not present

## 2014-10-25 DIAGNOSIS — M25521 Pain in right elbow: Secondary | ICD-10-CM

## 2014-10-25 NOTE — Progress Notes (Signed)
Patient ID: Robin Blackburn, female   DOB: 06/10/1945, 69 y.o.   MRN: [id number]          .     ACILITY: [facility]    LEVEL OF CARE:   SNF   CHIEF COMPLAINT:  Acute visit secondary to right arm pain-numbness?      HISTORY OF PRESENT ILLNESS:  Patient is a pleasant 69 year old female with a complex medical history including bilateral fifth metatarsal fractures which is followed by orthopedics.       does complain of some chronic pain and muscle discomfort-she is receiving Vicodin-7.5 325 every 4 hours when necessary- She is also on Robaxin and I recently increase this to 7  50 mg every 6 hours when necessary-this appears to be helping some However today she is complaining of some right arm pain-apparently also a small amount of numbness-she does not complain of any recent trauma.   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      Fracture of the right fifth metatarsal in September, now with fracture of the left fifth metatarsal.    Physical deconditioning.    Essential hypertension.    Neuropathy.     Hyponatremia.    Hypokalemia.    History of rheumatoid arthritis, fibromyalgia and reflex sympathetic dystrophy, according to the patient.  Hyperlipidemia.     CURRENT MEDICATIONS:  Medication list is reviewed.            Xanax 1 mg tablet every 6 hours p.r.n.     Plavix 75 q.d.       Bentyl 20 q.6 hours p.r.n., abdominal cramps.    Lomotil 1 tablet two times daily as needed for diarrhea or loose stool.     Microzide 12.5 daily.    Norco 7.5/3.5 q.4 daily.    Prevacid 30 mg b.i.d. daily.    Lidoderm patch to the right knee.    Robaxin 750 mg q.6 p.r.n.     Zofran q.6  p.r.n.     Paxil 10 q.d.    K-dur 20 q.d.    Pravachol 10 q.d.     Restasis ophthalmic 1 drop b.i.d.    Sodium chloride nasal spray 1 spray, each nostril, b.i.d.    Ambien 5 mg q.h.s. p.r.n.     I note that aspirin and simvastatin were discontinued.    SOCIAL HISTORY:                HOUSING:  The patient tells me she lived in her own apartment.  There is apparently a four-stair entrance stairway that she needs to manage.   FUNCTIONAL STATUS:  She has in-home support which she pays for privately, according to her.    REVIEW OF SYSTEMS: In general does not complain of fever chills.  Skin is not complaining of rashes or itching.  Head ears eyes nose mouth and throat does not complaining of sore throat or visual changes today--she does complain of some  nasal -head congestion       CHEST/RESPIRATORY:  No shortness of breath. Or increased cough-- CARDIAC:   No chest pain.     GI:  Does not complain of diarrhea  or abdominal discomfort today  GU does not complain of suprapubic discomfort or dysuria.   MUSCULOSKELETAL:  Complains of right arm pain as noted above   Neurologic does not complain  headache or dizziness currently.--Possible numbness right arm  Psych-does have a history of anxiety this point appears to be relatively stable  I recently increased her Xanax back to 1 mg every 6 hours secondary to increased anxiety symptoms apparently this is helping.     PHYSICAL EXAMINATION:  She is afebrile pulses 70 respirations 20 blood pressures are variable 127/58-150/90-118/62  Gen. this is a pleasant female in no distress during comfortably in her wheelchair  Skin is warm and dry.  she has somewhat thinning hair but this does not appear overtly changed from previous exams  Eyes pupils appear reactive light sclera and conjunctiva are clear visual acuity appears intact.  Oropharynx is clear mucous membranes moist   Nose did not note any drainage today-  Nose could not  really appreciate any active drainage                 CHEST/RESPIRATORY:  Clear air entry bilaterally. No labored breathing   CARDIOVASCULAR:   CARDIAC:   Heart sounds are normal.  There are no murmurs.  She appears to be euvolemic.      MUSCULOSKELETAL:   EXTREMITIES:   BILATERAL LOWER EXTREMITIES:    Moves both at baseline strength-.  Upper extremities right arm-again she has some history of very minimal weakness on the right versus the left status post CVA this is quite minimal-grip strength appears to be strong radial pulses intact capillary refill was intact I did not note any deformityTouch sensation is intact   t-.  Neurologic I could not really appreciate any overt lateralizing findings she does have a history of CVA her speech is clear cranial nerves appear intact. Does have a history of fairly mild right-sided weakness her grip strength appears to be strong --minimal deficit here .    Psych-she is alert and oriented pleasant and appropriate.  Labs.  10/09/2014.  WBC 8.4 hemoglobin 11.7 platelets 325.  Sodium 132 potassium 4.4 BUN 9 creatinine 0.78 CO2 32.  09/09/2014.  Liver function tests within normal limits.  07/29/2014-TSH within normal limits at 1.463.  Of note recent vitamin D level on April 14 was low at 17.6  08/21/2014.  Sodium 134 potassium 3.7 BUN 9 creatinine 0.78.  WBC 9.2 hemoglobin 11.2 platelets 386   08/07/2014.  WBC 9.7 hemoglobin 10.8 platelets 392.  Sodium 135 potassium 3.6 BUN 8 creatinine 0.82  06/28/2014.  WBC 8.5 hemoglobin 12.2 platelets 375.  Sodium 135 potassium 4.4 BUN 8 creatinine 0.8  02/20/2014  Magnesium level II.1.  Cholesterol 253 triglycerides 309H LDL 62-LDL 129.  .    .     ASSESSMENT/PLAN:                    #1-right arm discomfort question numbness-will check an x-ray of the arm and hand-also will check a cervical spine x-ray to check for any possible etiology.  She continues on Norco 7.5 every  4 hours when necessary-also is on Robaxin for muscle spasms when necessary.  #2-history of some nasal congestion residual-- been on Claritin in the past-will  do nasal spray saline to see if this helps she is afebrile clinically appears to be stable     .    History of remote left thalamic CVA.  Very little residual of this she continues on  Plavix--.    History of metatarsal fractures bilaterally-this is followed by orthopedics--appears to be stable.    [id number]                .

## 2014-10-26 ENCOUNTER — Ambulatory Visit (HOSPITAL_COMMUNITY): Payer: Medicare Other | Attending: Internal Medicine

## 2014-10-26 ENCOUNTER — Ambulatory Visit (HOSPITAL_COMMUNITY): Payer: Medicare Other

## 2014-10-26 DIAGNOSIS — M542 Cervicalgia: Secondary | ICD-10-CM | POA: Diagnosis not present

## 2014-10-26 DIAGNOSIS — M25511 Pain in right shoulder: Secondary | ICD-10-CM | POA: Insufficient documentation

## 2014-10-29 ENCOUNTER — Encounter: Payer: Self-pay | Admitting: Internal Medicine

## 2014-10-29 DIAGNOSIS — R0981 Nasal congestion: Secondary | ICD-10-CM | POA: Insufficient documentation

## 2014-10-29 DIAGNOSIS — M25529 Pain in unspecified elbow: Secondary | ICD-10-CM | POA: Insufficient documentation

## 2014-11-04 ENCOUNTER — Non-Acute Institutional Stay (SKILLED_NURSING_FACILITY): Payer: Medicare Other | Admitting: Internal Medicine

## 2014-11-04 DIAGNOSIS — M25521 Pain in right elbow: Secondary | ICD-10-CM

## 2014-11-04 DIAGNOSIS — E871 Hypo-osmolality and hyponatremia: Secondary | ICD-10-CM | POA: Diagnosis not present

## 2014-11-04 DIAGNOSIS — E876 Hypokalemia: Secondary | ICD-10-CM

## 2014-11-04 DIAGNOSIS — M79621 Pain in right upper arm: Secondary | ICD-10-CM | POA: Diagnosis not present

## 2014-11-04 NOTE — Progress Notes (Signed)
Patient ID: Robin Blackburn, female   DOB: 1946/03/29, 69 y.o.   MRN: [id number]  :           Robin Blackburn: [place]    LEVEL OF CARE:   SNF   CHIEF COMPLAINT:  Acute visit secondary to right arm pain-muscle spasms??      HISTORY OF PRESENT ILLNESS:  Patient is a pleasant 69 year old female with a complex medical history including bilateral fifth metatarsal fractures which is followed by orthopedics.       does complain of some chronic pain and muscle discomfort-she is receiving Vicodin-7.5 325 every 4 hours when necessary- She is also on Robaxin and I recently increase this to 7  50 mg every 6 hours when necessary  continues to complain of what she describes as possible muscle spasms.  Vital signs are stable-I am somewhat hesitant to increase her muscle relaxer secondary to sedation concerns she is also receiving significant pain management with the Vicodin.  Today she does not appear to be any acute distress  Will recheck her electrolytes most recent potassium was 4.4 sodium 132 earlier this month-she does have some history of chronic hyponatremia but this is relatively mild and well-controlled  Continues to complain  of some right arm pain-apparently also a small amount of numbness-she does not complain of any recent trauma-I did order x-rays of the arm and shoulder that did not show anything acute-did show degenerative changes.  Also x-rayed cervical spine which did not show anything acute as well.  Her physical exam appears to be baseline with previous exam.  She does have listed history of rheumatoid arthritis will check an RA factor as well as a sedimentation rate for further investigations.     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      Fracture of the right fifth metatarsal in September, now with fracture of the left fifth metatarsal.    Physical deconditioning.     Essential hypertension.    Neuropathy.     Hyponatremia.    Hypokalemia.    History of rheumatoid arthritis, fibromyalgia and reflex sympathetic dystrophy, according to the patient.  Hyperlipidemia.     CURRENT MEDICATIONS:  Medication list is reviewed.            Xanax 1 mg tablet every 6 hours p.r.n.     Plavix 75 q.d.       Bentyl 20 q.6 hours p.r.n., abdominal cramps.    Lomotil 1 tablet two times daily as needed for diarrhea or loose stool.     Microzide 12.5 daily.    Norco 7.5/3.5 q.4 daily.    Prevacid 30 mg b.i.d. daily.    Lidoderm patch to the right knee.    Robaxin 750 mg q.6 p.r.n.     Zofran q.6 p.r.n.     Paxil 10 q.d.    K-dur 20 q.d.    Pravachol 10 q.d.     Restasis ophthalmic 1 drop b.i.d.    Sodium chloride nasal spray 1 spray, each nostril, b.i.d.    Ambien 5 mg q.h.s. p.r.n.     I note that aspirin and simvastatin were discontinued.    SOCIAL HISTORY:                HOUSING:  The patient tells me she lived in her own apartment.  There is apparently a  four-stair entrance stairway that she needs to manage.   FUNCTIONAL STATUS:  She has in-home support which she pays for privately, according to her.    REVIEW OF SYSTEMS: In general does not complain of fever chills.  Skin is not complaining of rashes or itching.  Head ears eyes nose mouth and throat does not complaining of sore throat or visual changes today-not complain of nasal head congestion tonight she has complained of this in the past-she is now receiving saline nasal spray        CHEST/RESPIRATORY:  No shortness of breath. Or increased cough-- CARDIAC:   No chest pain.     GI:  Does not complain of diarrhea or abdominal discomfort today  GU does not complain of suprapubic discomfort or dysuria.   MUSCULOSKELETAL:  Complains of right arm pain as noted above as well as some possible lower extremity leg discomfort-she thinks may be spasms   Neurologic does not complain   headache or dizziness currently.--Possible numbness right arm  Psych-does have a history of anxiety this point appears to be relatively stable   .     PHYSICAL EXAMINATION: Temperature 97.5 pulse 72 respirations 18 blood pressure 127/73  Gen. this is a pleasant female in no distress during comfortably in her wheelchair  Skin is warm and dry.  she has somewhat thinning hair but this does not appear overtly changed from previous exams  Eyes pupils appear reactive light sclera and conjunctiva are clear visual acuity appears intact.  Oropharynx is clear mucous membranes moist   Nose did not note any drainage today-  Nose could not really appreciate any active drainage                 CHEST/RESPIRATORY:  Clear air entry bilaterally. No labored breathing   CARDIOVASCULAR:   CARDIAC:   Heart sounds are normal.  There are no murmurs.  She appears to be euvolemic-quite minimal lower extremity edema.      MUSCULOSKELETAL:   EXTREMITIES:   BILATERAL LOWER EXTREMITIES:    Moves both at baseline strength-.  Upper extremities right arm-again she has some history of very minimal weakness on the right versus the left status post CVA this is quite minimal-grip strength appears to be strong radial pulses intact capillary refill was intact I did not note any deformityTouch sensation is intact Has limited range of motion at the shoulder but this is not new cannot raise her right arm as high as her left again this is not new   t-.  Neurologic I could not really appreciate any overt lateralizing findings she does have a history of CVA her speech is clear cranial nerves appear intact. Does have a history of fairly mild right-sided weakness her grip strength appears to be strong --minimal deficit here .    Psych-she is alert and oriented pleasant and appropriate.  Labs.  10/09/2014.  WBC 8.4 hemoglobin 11.7 platelets 325.  Sodium 132 potassium 4.4 BUN 9 creatinine 0.78 CO2  32.  09/09/2014.  Liver function tests within normal limits.  07/29/2014-TSH within normal limits at 1.463.  Of note recent vitamin D level on April 14 was low at 17.6  08/21/2014.  Sodium 134 potassium 3.7 BUN 9 creatinine 0.78.  WBC 9.2 hemoglobin 11.2 platelets 386   08/07/2014.  WBC 9.7 hemoglobin 10.8 platelets 392.  Sodium 135 potassium 3.6 BUN 8 creatinine 0.82  06/28/2014.  WBC 8.5 hemoglobin 12.2 platelets 375.  Sodium 135 potassium 4.4 BUN 8 creatinine 0.8  02/20/2014  Magnesium level II.1.  Cholesterol 253 triglycerides 309H LDL 62-LDL 129.  .    .     ASSESSMENT/PLAN:                    #1-right arm discomfort question numbness- X-ray of the arm area shoulder-as well as cervical spine did not really reveal any acute process-did show degenerative changes-will update an ESR as well as rheumatoid factor as noted above  She continues on Norco 7.5 every 4 hours when necessary-also is on Robaxin for muscle spasms when necessary  #2-question muscle spasms-this does not appear really to be getting any worse-she is again on muscle relaxer again sedation concerns here with increasing this further-she appears to be comfortable today-will update her electrolytes--she is okay with this.  Discussed both of the above issues fairly extensively at bedside with patient  #3-history of some nasal congestion  Appears better with the saline nasal spray   --  .    [id number] note greater than 25 minutes spent assessing patient-reviewing her chart and studies-and coordinating and formulating a plan of care-of note greater than 50% of time spent coordinating plan of care with patient's input                .

## 2014-11-05 ENCOUNTER — Encounter: Payer: Self-pay | Admitting: Internal Medicine

## 2014-11-07 ENCOUNTER — Encounter: Payer: Self-pay | Admitting: Internal Medicine

## 2014-11-07 ENCOUNTER — Encounter (HOSPITAL_COMMUNITY)
Admission: RE | Admit: 2014-11-07 | Discharge: 2014-11-07 | Disposition: A | Discharge status: Home / Self Care | Payer: Medicare Other | Source: Skilled Nursing Facility | Attending: Internal Medicine | Admitting: Internal Medicine

## 2014-11-07 DIAGNOSIS — I10 Essential (primary) hypertension: Secondary | ICD-10-CM | POA: Diagnosis not present

## 2014-11-07 LAB — BASIC METABOLIC PANEL
Anion gap: 7 (ref 5–15)
BUN: 10 mg/dL (ref 6–20)
CALCIUM: 8.4 mg/dL — AB (ref 8.9–10.3)
CO2: 31 mmol/L (ref 22–32)
Chloride: 93 mmol/L — ABNORMAL LOW (ref 101–111)
Creatinine, Ser: 0.77 mg/dL (ref 0.44–1.00)
GFR calc Af Amer: >60 mL/min (ref >60)
GLUCOSE: 102 mg/dL — AB (ref 65–99)
Potassium: 4.3 mmol/L (ref 3.5–5.1)
Sodium: 131 mmol/L — ABNORMAL LOW (ref 135–145)

## 2014-11-07 LAB — CBC
HEMATOCRIT: 34.6 % — AB (ref 36.0–46.0)
HEMOGLOBIN: 11.4 g/dL — AB (ref 12.0–15.0)
MCH: 30.2 pg (ref 26.0–34.0)
MCHC: 32.9 g/dL (ref 30.0–36.0)
MCV: 91.8 fL (ref 78.0–100.0)
Platelets: 358 10*3/uL (ref 150–400)
RBC: 3.77 MIL/uL — ABNORMAL LOW (ref 3.87–5.11)
RDW: 12.8 % (ref 11.5–15.5)
WBC: 7.6 10*3/uL (ref 4.0–10.5)

## 2014-11-07 LAB — SEDIMENTATION RATE: SED RATE: 12 mm/h (ref 0–22)

## 2014-11-07 NOTE — Progress Notes (Signed)
This encounter was created in error - please disregard.

## 2014-11-08 ENCOUNTER — Encounter: Payer: Self-pay | Admitting: Internal Medicine

## 2014-11-08 ENCOUNTER — Non-Acute Institutional Stay (SKILLED_NURSING_FACILITY): Payer: Medicare Other | Admitting: Internal Medicine

## 2014-11-08 DIAGNOSIS — M79621 Pain in right upper arm: Secondary | ICD-10-CM | POA: Diagnosis not present

## 2014-11-08 DIAGNOSIS — M069 Rheumatoid arthritis, unspecified: Secondary | ICD-10-CM

## 2014-11-08 DIAGNOSIS — M25521 Pain in right elbow: Secondary | ICD-10-CM

## 2014-11-08 LAB — RHEUMATOID FACTOR: Rhuematoid fact SerPl-aCnc: 7 IU/mL (ref 0.0–13.9)

## 2014-11-08 NOTE — Progress Notes (Signed)
Patient ID: Robin Blackburn, female   DOB: Oct 16, 1945, 69 y.o.   MRN: [id number]            .     ACILITY: [facility]    LEVEL OF CARE:   SNF   CHIEF COMPLAINT:  Acute visit follow-up right arm pain      HISTORY OF PRESENT ILLNESS:  Patient is a pleasant 69 year old female with a complex medical history including bilateral fifth metatarsal fractures which is followed by orthopedics.       does complain of some chronic pain and muscle discomfort-she is receiving Vicodin-7.5 325 every 4 hours when necessary- She is also on Robaxin and I recently increase this to 7  50 mg every 6 hours when necessary  Abdomen somewhat hesitant to increase the secondary to sedation concerns.  Recent lab work was ordered which appear to be fairly unremarkable with a sodium of 131 she does have a history of some mild chronic hyponatremia potassium was 4.3.  Hemoglobin was stable at 11.4 white count was normal at 7.6.  Also sedimentation rate was normal at 12 as well as rheumatoid factor normal at 7.0.  Talking with patient today she states her rheumatologist has told her she has not RA factor rheumatoid arthritis which could explain the normal reading for the rheumatoid factor.  She continues to complain of pain more so in her right arm and to a lesser extent her lower extremities.  This was discussed with Dr. Dellia Nims there is some concern possibly this may be fibromyalgia.  I did speak with patient about follow-up with her rheumatologist-which she is open to-but thinks that it may take a while to get in to see her.  She would like to be started on Plaquenil --apparently she was going to start this at one point but was not started secondary to her concerns of trying to keep medication somewhat minimized.  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      Fracture of the right fifth metatarsal in September, now with fracture of the left fifth metatarsal.    Physical deconditioning.    Essential hypertension.    Neuropathy.     Hyponatremia.    Hypokalemia.    History of rheumatoid arthritis, fibromyalgia and reflex sympathetic dystrophy, according to the patient.  Hyperlipidemia.     CURRENT MEDICATIONS:  Medication list is reviewed.            Xanax 1 mg tablet every 6 hours p.r.n.     Plavix 75 q.d.       Bentyl 20 q.6 hours p.r.n., abdominal cramps.    Lomotil 1 tablet two times daily as needed for diarrhea or loose stool.     Microzide 12.5 daily.    Norco 7.5/3.5 q.4 daily.    Prevacid 30 mg b.i.d. daily.    Lidoderm patch to the right knee.    Robaxin 750 mg q.6 p.r.n.     Zofran q.6 p.r.n.     Paxil 10 q.d.    K-dur 20 q.d.    Pravachol 10 q.d.     Restasis ophthalmic 1 drop b.i.d.    Sodium chloride nasal spray 1 spray, each nostril, b.i.d.    Ambien 5 mg q.h.s. p.r.n.  I note that aspirin and simvastatin were discontinued.    SOCIAL HISTORY:                HOUSING:  The patient tells me she lived in her own apartment.  There is apparently a four-stair entrance stairway that she needs to manage.   FUNCTIONAL STATUS:  She has in-home support which she pays for privately, according to her.    REVIEW OF SYSTEMS: In general does not complain of fever chills.  Skin is not complaining of rashes or itching.  Head ears eyes nose mouth and throat does not complaining of sore throat or visual changes today-not complain of nasal head congestion t she has complained of this in the past-she is now receiving saline nasal spray        CHEST/RESPIRATORY:  No shortness of breath. Or increased cough-- CARDIAC:   No chest pain.     GI:  Does not complain of diarrhea or abdominal discomfort today  GU does not complain of suprapubic  discomfort or dysuria.   MUSCULOSKELETAL:  Complains of right arm pain as noted above as well as some possible lower extremity leg discomfort-she thinks may be spasms--this continues to be somewhat of an issue as noted above   Neurologic does not complain  headache or dizziness currently.--Possible numbness right arm  Psych-does have a history of anxiety this point appears to be relatively stable   .     PHYSICAL EXAMINATION: Temperature 97.3 pulse 76 respirations 18 blood pressure 131/71  Gen. this is a pleasant female in no distress during comfortably in her wheelchair  Skin is warm and dry.  she has somewhat thinning hair but this does not appear overtly changed from previous exams  Eyes pupils appear reactive light sclera and conjunctiva are clear visual acuity appears intact               CHEST/RESPIRATORY:  Clear air entry bilaterally. No labored breathing   CARDIOVASCULAR:   CARDIAC:   Heart sounds are normal.  There are no murmurs.  She appears to be euvolemic-quite minimal lower extremity edema.      MUSCULOSKELETAL:   EXTREMITIES:   BILATERAL LOWER EXTREMITIES:    Moves both at baseline strength-.  Upper extremities right arm-again she has some history of very minimal weakness on the right versus the left status post CVA this is quite minimal-grip strength appears to be strong radial pulses intact capillary refill was intact I did not note any deformityTouch sensation is intact Has limited range of motion at the shoulder but this is not new cannot raise her right arm as high as her left again this is not new There is some tenderness to palpation of the area shoulder area I really do not note any significantly increased swelling compared to the left shoulder.  She has some minimal edema to her right hand radial pulses intact I do not really note any active joints on her right hand there is some mild tenderness to palpation but this does not appear to be appear to be  acute tenderness   t-.  Neurologic I could not really appreciate any overt lateralizing findings she does have a history of CVA her speech is clear cranial nerves appear intact. Does have a history of fairly mild right-sided weakness her grip strength appears to be strong --minimal deficit here .    Psych-she is alert and oriented pleasant and appropriate.  Labs  11/07/2014.  Sodium 131 potassium 4.3 BUN 10  creatinine 0.77.  WBC 7.6 hemoglobin 11.4 platelets 358.  Sedimentation rate is 12.  Rheumatoid factor-7.0  .  10/09/2014.  WBC 8.4 hemoglobin 11.7 platelets 325.  Sodium 132 potassium 4.4 BUN 9 creatinine 0.78 CO2 32.  09/09/2014.  Liver function tests within normal limits.  07/29/2014-TSH within normal limits at 1.463.  Of note recent vitamin D level on April 14 was low at 17.6  08/21/2014.  Sodium 134 potassium 3.7 BUN 9 creatinine 0.78.  WBC 9.2 hemoglobin 11.2 platelets 386   08/07/2014.  WBC 9.7 hemoglobin 10.8 platelets 392.  Sodium 135 potassium 3.6 BUN 8 creatinine 0.82  06/28/2014.  WBC 8.5 hemoglobin 12.2 platelets 375.  Sodium 135 potassium 4.4 BUN 8 creatinine 0.8  02/20/2014  Magnesium level II.1.  Cholesterol 253 triglycerides 309H LDL 62-LDL 129.  .    .     ASSESSMENT/PLAN:                    #1-right arm discomfort - X-ray of the arm area shoulder-as well as cervical spine did not really reveal any acute process-did show degenerative changes-worked as noted above was unremarkable.  This was discussed with Dr. Erma Heritage would really like her to see her rheumatologist   Dr. Lavella Hammock starting her on any new medication-will write an order to see if there is any chance we could get her in to see her in some expedient [facility] appreciate rheumatology input on starting the medication and any possible labs to follow-up on    She continues on Norco 7.5 every 4 hours when necessary-also is on Robaxin for muscle spasms  when necessary  She has also asked about possibly seeing her neurosurgeon Dr. Loanne Drilling and we will write an order for that for follow-up--  #2-question muscle spasms-this does not appear really to be getting any worse-she is again on muscle relaxer again sedation concerns here with increasing this further-she appears to be comfortable today--electrolytes appear to be at baseline  Discussed both of the above issues fairly extensively at bedside with patient    [id number]                .

## 2014-11-11 ENCOUNTER — Encounter (HOSPITAL_COMMUNITY)
Admission: RE | Admit: 2014-11-11 | Discharge: 2014-11-11 | Disposition: A | Discharge status: Home / Self Care | Payer: Medicare Other | Source: Skilled Nursing Facility | Attending: Internal Medicine | Admitting: Internal Medicine

## 2014-11-11 DIAGNOSIS — E785 Hyperlipidemia, unspecified: Secondary | ICD-10-CM | POA: Insufficient documentation

## 2014-11-11 DIAGNOSIS — N390 Urinary tract infection, site not specified: Secondary | ICD-10-CM | POA: Diagnosis not present

## 2014-11-11 DIAGNOSIS — I10 Essential (primary) hypertension: Secondary | ICD-10-CM | POA: Diagnosis not present

## 2014-11-11 DIAGNOSIS — D72829 Elevated white blood cell count, unspecified: Secondary | ICD-10-CM | POA: Diagnosis not present

## 2014-11-11 DIAGNOSIS — E876 Hypokalemia: Secondary | ICD-10-CM | POA: Diagnosis not present

## 2014-11-11 LAB — URINALYSIS, ROUTINE W REFLEX MICROSCOPIC
Bilirubin Urine: NEGATIVE
Glucose, UA: 100 mg/dL — AB
Ketones, ur: NEGATIVE mg/dL
Nitrite: POSITIVE — AB
Protein, ur: NEGATIVE mg/dL
Specific Gravity, Urine: 1.01 (ref 1.005–1.030)
Urobilinogen, UA: 1 mg/dL (ref 0.0–1.0)
pH: 6.5 (ref 5.0–8.0)

## 2014-11-11 LAB — URINE MICROSCOPIC-ADD ON

## 2014-11-14 LAB — URINE CULTURE: Colony Count: >=100000

## 2014-11-16 ENCOUNTER — Other Ambulatory Visit: Payer: Self-pay

## 2014-11-16 MED ORDER — HYDROCODONE-ACETAMINOPHEN 7.5-325 MG PO TABS: ORAL_TABLET | ORAL | Status: DC

## 2014-11-16 NOTE — Telephone Encounter (Signed)
RX faxed to [facility] @ [phone]. Phone number [phone]  

## 2014-11-21 ENCOUNTER — Ambulatory Visit (HOSPITAL_COMMUNITY)
Admission: RE | Admit: 2014-11-21 | Discharge: 2014-11-21 | Disposition: A | Discharge status: Home / Self Care | Payer: Medicare Other | Source: Ambulatory Visit | Attending: Internal Medicine | Admitting: Internal Medicine

## 2014-11-21 DIAGNOSIS — R609 Edema, unspecified: Secondary | ICD-10-CM | POA: Insufficient documentation

## 2014-11-21 DIAGNOSIS — M79601 Pain in right arm: Secondary | ICD-10-CM | POA: Diagnosis present

## 2014-12-01 ENCOUNTER — Non-Acute Institutional Stay (SKILLED_NURSING_FACILITY): Payer: Medicare Other | Admitting: Internal Medicine

## 2014-12-01 DIAGNOSIS — G894 Chronic pain syndrome: Secondary | ICD-10-CM | POA: Diagnosis not present

## 2014-12-01 DIAGNOSIS — Z8673 Personal history of transient ischemic attack (TIA), and cerebral infarction without residual deficits: Secondary | ICD-10-CM | POA: Diagnosis not present

## 2014-12-01 DIAGNOSIS — I10 Essential (primary) hypertension: Secondary | ICD-10-CM | POA: Diagnosis not present

## 2014-12-01 DIAGNOSIS — E871 Hypo-osmolality and hyponatremia: Secondary | ICD-10-CM | POA: Diagnosis not present

## 2014-12-01 DIAGNOSIS — S92351D Displaced fracture of fifth metatarsal bone, right foot, subsequent encounter for fracture with routine healing: Secondary | ICD-10-CM | POA: Diagnosis not present

## 2014-12-01 DIAGNOSIS — E785 Hyperlipidemia, unspecified: Secondary | ICD-10-CM | POA: Diagnosis not present

## 2014-12-01 DIAGNOSIS — M069 Rheumatoid arthritis, unspecified: Secondary | ICD-10-CM | POA: Diagnosis not present

## 2014-12-02 ENCOUNTER — Encounter: Payer: Self-pay | Admitting: Internal Medicine

## 2014-12-02 DIAGNOSIS — G894 Chronic pain syndrome: Secondary | ICD-10-CM | POA: Insufficient documentation

## 2014-12-02 NOTE — Progress Notes (Signed)
ID: [name], female   DOB: 08-04-1945, 69 y.o.   MRN: [id number]          .   [name] Kitchen This is a routine visit  ACILITY: [facility]:   SNF   CHIEF COMPLAINT: Medical management of chronic medical conditions including bilateral metatarsal fractures-hypertension-depression-anxiety-chronic pain-rheumatoid arthritis-history CVA--      HISTORY OF PRESENT ILLNESS:  This is a patient whom we actually had in the building from September to mid October.  At that point, she had suffered an acute thalamic infarct and, after her arrival here, we discovered her to have a fracture of the right fifth metatarsal for which she was referred to Orthopedics.  She was discharged to her own apartment which apparently has four stairs to enter.         She was discovered during this admission to have a minimally displaced fracture of the left fifth metatarsal and, as noted, has the original fracture that we diagnosed in September --.  These issues have been relatively stable and followed by orthopedics   does complain of some chronic pain and muscle discomfort-she is receiving Vicodin-7.5 325 every 4 hours when necessary- She is also on Robaxin and I recently increase this to 7  50 mg every 6 hours when necessary-this appears to be helping some She continues to complain of somewhat generalized pain.  This is more so in  her right shoulder recently we have done x-rays which were nondiagnostic-we have also attempted to arrange follow-up with her rheumatologist-as well as neurologist.  Apparently this is pending with her neurologist.  Edwena Bunde also done a sedimentation rate and RA factor which were within normal limits.  .  She also at times has received azithromycin for suspected bronchitis--this has been stable recently  Patient does have a history of anxiety she is on Xanax as needed this appears to be relatively stable.  In regards to depression she is on Paxil which  appears to be stable as well.  She does have a history of hypertension she is on Microzide recent blood pressures appear to be variable recently 145/76-133/84-131/60-121/67-155/76--I do not see consistent elevations   Today her main complaint is somewhat generalized pain-            PAST MEDICAL HISTORY/PROBLEM LIST:         History of a left lateral thalamus infarction in September of this year.      Fracture of the right fifth metatarsal in September, now with fracture of the left fifth metatarsal.    Physical deconditioning.    Essential hypertension.    Neuropathy.     Hyponatremia.    Hypokalemia.    History of rheumatoid arthritis, fibromyalgia and reflex sympathetic dystrophy, according to the patient.  Hyperlipidemia.     CURRENT MEDICATIONS:  Medication list is reviewed.            Xanax 1 mg tablet every 6 hours p.r.n.     Plavix 75 q.d.       Bentyl 20 q.6 hours p.r.n., abdominal cramps.    Lomotil 1 tablet two times daily as needed for diarrhea or loose stool.     Microzide 12.5 daily.    Norco 7.5/3.5 q.4 daily.    Prevacid 30 mg b.i.d. daily.    Lidoderm patch to the right knee.    Robaxin 750 mg q.6 p.r.n.     Zofran q.6 p.r.n.     Paxil 10  q.d.    K-dur 20 q.d.    Pravachol 10 q.d.     Restasis ophthalmic 1 drop b.i.d.    Sodium chloride nasal spray 1 spray, each nostril, b.i.d.    Ambien 10 mg q.h.s. p.r.n.     I note that aspirin and simvastatin were discontinued.    SOCIAL HISTORY:                HOUSING:  The patient tells me she lived in her own apartment.  There is apparently a four-stair entrance stairway that she needs to manage.   FUNCTIONAL STATUS:  She has in-home support which she pays for privately, according to her.    REVIEW OF SYSTEMS: In general does not complain of fever chills.  Skin is not complaining of rashes or itching.  Head ears eyes nose mouth and throat does not complaining of sore throat or  visual changes today--       CHEST/RESPIRATORY:  No shortness of breath. Or increased cough- CARDIAC:   No chest pain.     GI:  Does not complain of diarrhea or abdominal discomfort today  GU does not complain of suprapubic discomfort or dysuria.   MUSCULOSKELETAL:  Somewhat chronic complaints of generalized pain--more so in the right shoulder as notedabove   NeurologicSome previous history of complaints of neuropathic pain does not complain of numbness headache or dizziness currently.--  Psych-does have a history of anxiety this point appears to be relatively stable   increased her Xanax back to 1 mg every 6 hours secondary to increased anxiety symptoms apparently this is helping.     PHYSICAL EXAMINATION:  Temperature 97.7 pulse 80 respirations 18 blood pressure as noted above sitting comfortably in her wheelchair Gen.  pleasant elderly female in no distress sitting comfortably in her wheelchair   Skin is warm and dry.  she has somewhat thinning hair but this does not appear overtly changed from previous exams  Eyes pupils appear reactive light sclera and conjunctiva are clear visual acuity appears intact.  Oropharynx is clear mucous membranes moist possibly a small amount of clear drainage---  Nose could not really appreciate any active drainage                 CHEST/RESPIRATORY:  Clear air entry bilaterally. No labored breathing   CARDIOVASCULAR:   CARDIAC:   Heart sounds are normal.  There are no murmurs.  She appears to be euvolemic--.   GASTROINTESTINAL: Abdomen is obese soft does not appear to be overtly tender -bowel sounds are positive  LIVER/SPLEEN/KIDNEYS:  No liver, no spleen.  No tenderness.   GENITOURINARY:  BLADDER:   I did not note any bladder distention or overt tenderness MUSCULOSKELETAL:   EXTREMITIES:   BILATERAL LOWER EXTREMITIES:    Moves both at baseline strength-she does have abrace right ankle area-she has minimal lower extremity edema pedal pulses  are intact bilaterally I do not note any deformities  Strength upper extremities bilaterally appears grossly intact again possibly since mild right-sided weakness range of motion her right shoulder appears fairly baseline  t-.  eurologic I could not really appreciate any overt lateralizing findings she does have a history of CVA her speech is clear cranial nerves appear intact. Does have a history of fairly mild right-sided weakness her grip strength appears to be strong --minimal deficit here     .    Psych-she is alert and oriented pleasant and appropriate.  Labs  11/07/2014.  Sodium 131 potassium 4.3  BUN 10 creatinine 0.77.  WBC 7.6 hemoglobin 11.4 platelets 358.  Sedimentation rate 12.  RA factor 7.0  .  10/09/2014.  WBC 8.4 hemoglobin 11.7 platelets 325.  Sodium 132 potassium 4.4 BUN 9 creatinine 0.78 CO2 32.  09/09/2014.  Liver function tests within normal limits.  07/29/2014-TSH within normal limits at 1.463.  Of note recent vitamin D level on April 14 was low at 17.6  08/21/2014.  Sodium 134 potassium 3.7 BUN 9 creatinine 0.78.  WBC 9.2 hemoglobin 11.2 platelets 386   08/07/2014.  WBC 9.7 hemoglobin 10.8 platelets 392.  Sodium 135 potassium 3.6 BUN 8 creatinine 0.82  06/28/2014.  WBC 8.5 hemoglobin 12.2 platelets 375.  Sodium 135 potassium 4.4 BUN 8 creatinine 0.8  02/20/2014  Magnesium level II.1.  Cholesterol 253 triglycerides 309H LDL 62-LDL 129.  .    .     ASSESSMENT/PLAN:                         chronic pain muscle spasms -- rheumatoid arthritis-patient is on a significant dose of Vicodin as noted above- We have increased her Robaxin 750 mg every 6 hours previously apparently this is helping some- She is still concerned however-I did discuss this extensively with her at bedside-I note she is on a low dose statin for hyperlipidemia-we will discontinue this for now and update a CK level.-As well as liver function  tests  Also will check a fasting lipid panel I do note last month lipid panel was fairly unremarkable with LDL of 88 cholesterol 173 triglycerides was 205 HDL 44     -question increased anxiety Her Xanax recently was increased back to 1 mg every 6 hours when necessary this appears to be helping     .    History of remote left thalamic CVA.  Very little residual of this she continues on Plavix--.        History of metatarsal fractures bilaterally-this is followed by orthopedics--appears to be stable.         Hyponatremia and hypokalemia.  The etiology here is unclear. But appears to be stable during her stay here --will update a metabolic panel    History of rheumatoid arthritis, fibromyalgia and reflex sympathetic dystrophy, according to the patient.  She has chronic pain issues,-  We have discussed follow-up with the rheumatologist however patient states that she would probably not do much more than we have ordering labs-she states she had a prescription for Plaquenil before her recent acute events led to her hospitalization-at this point will attempt to contact rheumatology to see if she can start this-this possibly may help with her pain as well as     Hypertension-at this point appearsvariable  she is on hydrochlorothiazide recent blood pressures are variable at this point will continue to monitor--per review I do not see consistent elevations    GERD this appears stable on Prilosec she is no longer complaining symptoms with this will have to be monitored.  [name] Sea is been somewhat of a generalized chronic complaint- Lab work has been unremarkable as well as x-rays-again will await rheumatology input as well as neurology input  .    Depression-at this point is stableon Paxil .                             Insomnia she is on Ambien this was increased secondary patient requests saying the lower dose of Ambien  was not effective she appears to have tolerated  this well although will have to be watched--currently on 10 mg a day--      vitamin D deficiency-she has been started on supplementation Will update level  Hyperlipidemia-she is on pravastatin again we will discontinue this temporarily at least secondary to complaints of generalized pain-will update lipid panel recent liver function tests   History of bronchitis- This recently has been stable has received azithromycin in the past    .  [id number]-of note greater than 40 minutes spent assessing patient-addressing her concerns at bedside-and coordinating and formulating a plan of care for numerous diagnoses-of note greater than 50% of time spent coordinating plan of care with extensive discussion with patient at bedside

## 2014-12-04 ENCOUNTER — Encounter (HOSPITAL_COMMUNITY)
Admission: RE | Admit: 2014-12-04 | Discharge: 2014-12-04 | Disposition: A | Discharge status: Home / Self Care | Payer: Medicare Other | Source: Skilled Nursing Facility | Attending: Internal Medicine | Admitting: Internal Medicine

## 2014-12-04 DIAGNOSIS — N390 Urinary tract infection, site not specified: Secondary | ICD-10-CM | POA: Diagnosis not present

## 2014-12-04 LAB — CBC WITH DIFFERENTIAL/PLATELET
BASOS ABS: 0.1 10*3/uL (ref 0.0–0.1)
Basophils Relative: 1 % (ref 0–1)
EOS ABS: 0.2 10*3/uL (ref 0.0–0.7)
EOS PCT: 2 % (ref 0–5)
HEMATOCRIT: 34.4 % — AB (ref 36.0–46.0)
Hemoglobin: 11.1 g/dL — ABNORMAL LOW (ref 12.0–15.0)
LYMPHS PCT: 40 % (ref 12–46)
Lymphs Abs: 3.5 10*3/uL (ref 0.7–4.0)
MCH: 29.6 pg (ref 26.0–34.0)
MCHC: 32.3 g/dL (ref 30.0–36.0)
MCV: 91.7 fL (ref 78.0–100.0)
MONO ABS: 1 10*3/uL (ref 0.1–1.0)
Monocytes Relative: 11 % (ref 3–12)
Neutro Abs: 4.1 10*3/uL (ref 1.7–7.7)
Neutrophils Relative %: 46 % (ref 43–77)
PLATELETS: 377 10*3/uL (ref 150–400)
RBC: 3.75 MIL/uL — ABNORMAL LOW (ref 3.87–5.11)
RDW: 13 % (ref 11.5–15.5)
WBC: 8.9 10*3/uL (ref 4.0–10.5)

## 2014-12-04 LAB — COMPREHENSIVE METABOLIC PANEL
ALBUMIN: 3.1 g/dL — AB (ref 3.5–5.0)
ALT: 12 U/L — AB (ref 14–54)
AST: 13 U/L — ABNORMAL LOW (ref 15–41)
Alkaline Phosphatase: 64 U/L (ref 38–126)
Anion gap: 8 (ref 5–15)
BUN: 9 mg/dL (ref 6–20)
CHLORIDE: 92 mmol/L — AB (ref 101–111)
CO2: 32 mmol/L (ref 22–32)
Calcium: 8.6 mg/dL — ABNORMAL LOW (ref 8.9–10.3)
Creatinine, Ser: 0.71 mg/dL (ref 0.44–1.00)
GFR calc Af Amer: >60 mL/min (ref >60)
Glucose, Bld: 97 mg/dL (ref 65–99)
Potassium: 4.3 mmol/L (ref 3.5–5.1)
Sodium: 132 mmol/L — ABNORMAL LOW (ref 135–145)
Total Bilirubin: 0.5 mg/dL (ref 0.3–1.2)
Total Protein: 6 g/dL — ABNORMAL LOW (ref 6.5–8.1)

## 2014-12-04 LAB — LIPID PANEL
CHOLESTEROL: 183 mg/dL (ref 0–200)
HDL: 47 mg/dL (ref >40)
LDL CALC: 82 mg/dL (ref 0–99)
Total CHOL/HDL Ratio: 3.9 RATIO
Triglycerides: 269 mg/dL — ABNORMAL HIGH (ref <150)
VLDL: 54 mg/dL — ABNORMAL HIGH (ref 0–40)

## 2014-12-04 LAB — CK: CK TOTAL: 65 U/L (ref 38–234)

## 2014-12-08 ENCOUNTER — Other Ambulatory Visit (HOSPITAL_COMMUNITY)
Admission: AD | Admit: 2014-12-08 | Discharge: 2014-12-08 | Disposition: A | Discharge status: Home / Self Care | Payer: Medicare Other | Source: Skilled Nursing Facility | Attending: Internal Medicine | Admitting: Internal Medicine

## 2014-12-08 DIAGNOSIS — M069 Rheumatoid arthritis, unspecified: Secondary | ICD-10-CM | POA: Diagnosis present

## 2014-12-09 LAB — VITAMIN D 25 HYDROXY (VIT D DEFICIENCY, FRACTURES): Vit D, 25-Hydroxy: 22.6 ng/mL — ABNORMAL LOW (ref 30.0–100.0)

## 2014-12-11 ENCOUNTER — Other Ambulatory Visit (HOSPITAL_COMMUNITY)
Admission: RE | Admit: 2014-12-11 | Discharge: 2014-12-11 | Disposition: A | Discharge status: Home / Self Care | Payer: Medicare Other | Source: Ambulatory Visit | Attending: Internal Medicine | Admitting: Internal Medicine

## 2014-12-11 DIAGNOSIS — E876 Hypokalemia: Secondary | ICD-10-CM | POA: Insufficient documentation

## 2014-12-11 LAB — SODIUM: SODIUM: 131 mmol/L — AB (ref 135–145)

## 2014-12-19 ENCOUNTER — Other Ambulatory Visit: Payer: Self-pay | Admitting: *Deleted

## 2014-12-19 MED ORDER — HYDROCODONE-ACETAMINOPHEN 7.5-325 MG PO TABS: ORAL_TABLET | ORAL | Status: DC

## 2014-12-19 NOTE — Telephone Encounter (Signed)
[facility] 

## 2014-12-20 ENCOUNTER — Other Ambulatory Visit (HOSPITAL_COMMUNITY)
Admission: RE | Admit: 2014-12-20 | Discharge: 2014-12-20 | Disposition: A | Discharge status: Home / Self Care | Payer: Medicare Other | Source: Ambulatory Visit | Attending: Internal Medicine | Admitting: Internal Medicine

## 2014-12-20 DIAGNOSIS — E876 Hypokalemia: Secondary | ICD-10-CM | POA: Insufficient documentation

## 2014-12-20 LAB — BASIC METABOLIC PANEL
ANION GAP: 9 (ref 5–15)
BUN: 9 mg/dL (ref 6–20)
CO2: 32 mmol/L (ref 22–32)
CREATININE: 0.71 mg/dL (ref 0.44–1.00)
Calcium: 8.8 mg/dL — ABNORMAL LOW (ref 8.9–10.3)
Chloride: 90 mmol/L — ABNORMAL LOW (ref 101–111)
GFR calc non Af Amer: >60 mL/min (ref >60)
Glucose, Bld: 101 mg/dL — ABNORMAL HIGH (ref 65–99)
Potassium: 4.1 mmol/L (ref 3.5–5.1)
SODIUM: 131 mmol/L — AB (ref 135–145)

## 2014-12-22 ENCOUNTER — Other Ambulatory Visit: Payer: Self-pay | Admitting: *Deleted

## 2014-12-22 MED ORDER — ZOLPIDEM TARTRATE 5 MG PO TABS
ORAL_TABLET | ORAL | Status: DC
Start: 2014-12-22 — End: 2015-07-08

## 2014-12-22 NOTE — Telephone Encounter (Signed)
[facility] 

## 2015-01-03 ENCOUNTER — Non-Acute Institutional Stay (SKILLED_NURSING_FACILITY): Payer: Medicare Other | Admitting: Internal Medicine

## 2015-01-03 DIAGNOSIS — I10 Essential (primary) hypertension: Secondary | ICD-10-CM

## 2015-01-03 DIAGNOSIS — M069 Rheumatoid arthritis, unspecified: Secondary | ICD-10-CM | POA: Diagnosis not present

## 2015-01-03 DIAGNOSIS — G894 Chronic pain syndrome: Secondary | ICD-10-CM

## 2015-01-03 DIAGNOSIS — S92351D Displaced fracture of fifth metatarsal bone, right foot, subsequent encounter for fracture with routine healing: Secondary | ICD-10-CM

## 2015-01-03 DIAGNOSIS — I639 Cerebral infarction, unspecified: Secondary | ICD-10-CM | POA: Diagnosis not present

## 2015-01-03 DIAGNOSIS — R112 Nausea with vomiting, unspecified: Secondary | ICD-10-CM | POA: Diagnosis not present

## 2015-01-03 NOTE — Progress Notes (Signed)
Patient ID: Robin Blackburn, female   DOB: [birth year], 69 y.o.   MRN: [id number]           .   [name] Kitchen This is a routine  acute visit  ACILITY: [facility]:   SNF   CHIEF COMPLAINT: Medical management of chronic medical conditions including bilateral metatarsal fractures-hypertension-depression-anxiety-chronic pain-rheumatoid arthritis-history CVA-- Acute visit secondary to "plugged ears"-episode of vomiting last night      HISTORY OF PRESENT ILLNESS:  This is a patient whom we actually had in the building from September to mid October.  At that point, she had suffered an acute thalamic infarct and, after her arrival here, we discovered her to have a fracture of the right fifth metatarsal for which she was referred to Orthopedics.  She was discharged to her own apartment which apparently has four stairs to enter.         She was discovered during this admission to have a minimally displaced fracture of the left fifth metatarsal and, as noted, has the original fracture that we diagnosed in September --.  These issues have been relatively stable and followed by orthopedics--in fact she is now able to wear tennis shoes this appears to be quite stable which is encouraging   does complain of some chronic pain and muscle discomfort-she is receiving Vicodin-7.5 325 every 4 hours when necessary- She is also on Robaxin and I recently increase this to 7  50 mg every 6 hours when necessary-this appears to be helping some She continues to complain of somewhat generalized pain.  Ishe has seen her rheumatologist who has restarted her Plaqueni--  .  She also at times has received azithromycin for suspected bronchitis--this has been stable recently She is complaining of some allergy symptoms --head congestion --has done well with Clarinex  in the past like this restarted for short course.  She also had an episode of vomiting last night she thinks it may have been food  poisoning although cannot rule out a mild GI virus-there's been no further vomiting says she doesn't have much of an appetite today however feel somewhat weak she is on a clear liquid diet.  Does not complain of any acute stomach pain does says her stomach feels a little sore   Patient does have a history of anxiety she is on Xanax as needed this appears to be relatively stable.  In regards to depression she is on Paxil which appears to be stable as well.  She does have a history of hypertension she is on Microzide recent blood pressures appear to be relatively stable 136/81-134/79 127/70 the highest one that I see is 150/84- blood pressure tonight is high 140s over mid 80s-at this point will monitor since she does not appear to be consistently elevated  She is also complaining of some bilateral hearing loss she feels this has been essentially since she had her CVA-her mild rightside and weakness appears to be essentially resolved.              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      Fracture of the right fifth metatarsal in September, now with fracture of the left fifth metatarsal.    Physical deconditioning.    Essential hypertension.    Neuropathy.     Hyponatremia.    Hypokalemia.    History of rheumatoid  arthritis, fibromyalgia and reflex sympathetic dystrophy, according to the patient.  Hyperlipidemia.     CURRENT MEDICATIONS:  Medication list is reviewed.            Xanax 1 mg tablet every 6 hours p.r.n.     Plavix 75 q.d.       Bentyl 20 q.6 hours p.r.n., abdominal cramps.    Lomotil 1 tablet two times daily as needed for diarrhea or loose stool.     Microzide 12.5 daily.    Norco 7.5/3.5 q.4 daily.    Prevacid 30 mg b.i.d. daily.    Lidoderm patch to the right knee.    Robaxin 750 mg q.6 p.r.n.     Zofran q.6 p.r.n.     Paxil 10 q.d.    K-dur 20 q.d.     Pravachol 10 q.d.     Restasis ophthalmic 1 drop b.i.d.    Sodium chloride nasal spray 1 spray, each nostril, b.i.d.    Ambien 10 mg q.h.s. P.r.n  Plaqueni 200 mg with lunch in July-100 mg in August     I note that aspirin and simvastatin were discontinued.    SOCIAL HISTORY:                HOUSING:  The patient tells me she lived in her own apartment.  There is apparently a four-stair entrance stairway that she needs to manage.   FUNCTIONAL STATUS:  She has in-home support which she pays for privately, according to her.    REVIEW OF SYSTEMS: In general does not complain of fever chills.  Skin is not complaining of rashes or itching.  Head ears eyes nose mouth and throat does not complaining of sore throat or visual changes today is complaining of some mild head congestion-- Also states she has some hearing difficulty bilaterally       CHEST/RESPIRATORY:  No shortness of breath. Or increased cough- CARDIAC:   No chest pain.     GI:  Apparently had an episode of vomiting last night she reports a little diarrhea apparently at that time as well-no further episodes just says her stomach feels a little sore  GU does not complain of suprapubic discomfort or dysuria.   MUSCULOSKELETAL:  Somewhat chronic complaints of generalized pain-   NeurologicSome previous history of complaints of neuropathic pain does not complain of numbness headache or dizziness currently.--  Psych-does have a history of anxiety this point appears to be relatively stable   increased her Xanax back to 1 mg every 6 hours secondary to increased anxiety symptoms apparently this is helping.     PHYSICAL EXAMINATION:  Temperature is 98.0 pulse 72 respirations 22 blood pressure again 149/84-weight is 207.2 s Gen.  pleasant elderly female in no distress sitting comfortably in her wheelchair   Skin is warm and dry.  she has somewhat thinning hair but this does not appear overtly changed from previous  exams  Eyes pupils appear reactive light sclera and conjunctiva are clear visual acuity appears intact. Ears-tympanic membranes are somewhat difficult to visualize bilaterally she appears to have some wax in both ear canals I did not note any exudate discharge bleeding or increased erythema  Oropharynx is clear mucous membranes moist possibly a small amount of clear drainage---  Nose could not really appreciate any active drainage                 CHEST/RESPIRATORY:  Clear air entry bilaterally. No labored breathing   CARDIOVASCULAR:   CARDIAC:  Heart sounds are normal rate and rhythm.  There are no murmurs.  She appears to be euvolemic--.   GASTROINTESTINAL: Abdomen is obese soft does not appear to be overtly tender -bowel sounds are positive--says her stomach in general just feels a little sore last day or so  LIVER/SPLEEN/KIDNEYS:  No liver, no spleen.  No tenderness.   GENITOURINARY:  BLADDER:   I did not note any bladder distention or overt tenderness MUSCULOSKELETAL:   EXTREMITIES:   BILATERAL LOWER EXTREMITIES:    Moves both at baseline strength-she has minimal lower extremity edema pedal pulses are intact bilaterally I do not note any deformities  Strength upper extremities bilaterally appears grossly intact again possibly since mild right-sided weakness but this is quite minimal  t-.  Neuro I could not really appreciate any overt lateralizing findings she does have a history of CVA her speech is clear cranial nerves appear intact. Does have a history of fairly mild right-sided weakness her grip strength appears to be strong --minimal deficit here     .    Psych-she is alert and oriented pleasant and appropriate.  Labs  12/20/2014.  Sodium 131 potassium 4.1 BUN 9 creatinine 0.71.  Calcium 8.8.  12/08/2014.  Vitamin D 22.6.  12/04/2014.  WBC 8.9 hemoglobin 11.1 platelets 377.  W in 3.1 AST 13-ALT 12 otherwise liver function tests within normal  limits.  Cholesterol 183-triglycerides 269-HDL 47-LDL 82  11/07/2014.  Sodium 131 potassium 4.3 BUN 10 creatinine 0.77.  WBC 7.6 hemoglobin 11.4 platelets 358.  Sedimentation rate 12.  RA factor 7.0  .  10/09/2014.  WBC 8.4 hemoglobin 11.7 platelets 325.  Sodium 132 potassium 4.4 BUN 9 creatinine 0.78 CO2 32.  09/09/2014.  Liver function tests within normal limits.  07/29/2014-TSH within normal limits at 1.463.  Of note recent vitamin D level on April 14 was low at 17.6  08/21/2014.  Sodium 134 potassium 3.7 BUN 9 creatinine 0.78.  WBC 9.2 hemoglobin 11.2 platelets 386   08/07/2014.  WBC 9.7 hemoglobin 10.8 platelets 392.  Sodium 135 potassium 3.6 BUN 8 creatinine 0.82  06/28/2014.  WBC 8.5 hemoglobin 12.2 platelets 375.  Sodium 135 potassium 4.4 BUN 8 creatinine 0.8  02/20/2014  Magnesium level II.1.  Cholesterol 253 triglycerides 309H LDL 62-LDL 129.  .    .     ASSESSMENT/PLAN:                         chronic pain muscle spasms -- rheumatoid arthritis-patient is on a significant dose of Vicodin as noted above- We have increased her Robaxin 750 mg every 6 hours previously apparently this is helping some-  Several weeks ago her statin was discontinued secondary to her concerns this may be causing some generalized pain.  She also is now on Plaqueni--  Still at times complains of  Pain  At times but this does not appear to be increased-at this point will monitor since she is on significant medication   In regards hyperlipidemia this appears stable with cholesterol 183 triglycerides 269 HDL 47 LDL of 82 on lab done 12/04/2014 at this point will monitor she prefers to be off the statin     -question increased anxiety Her Xanax recently was increased back to 1 mg every 6 hours when necessary this appears to be helping     .    History of remote left thalamic CVA.  Very little residual of this she continues on Plavix--.  History of metatarsal fractures bilaterally-this is followed by orthopedics--appears to be stable now essentially wearing street shoes which is encouraging.         Hyponatremia and hypokalemia.  The etiology here is unclear. But appears to be stable during her stay here --will update a metabolic panel    History of rheumatoid arthritis, fibromyalgia and reflex sympathetic dystrophy, according to the patient.  She has chronic pain issues, medication changes as noted above-         Hypertension-at this point appearsvariable  she is on hydrochlorothiazide recent blood pressures are variable at this point will continue to monitor--per review I do not see consistent elevations    GERD this appears stable on Prilosec she is no longer complaining symptoms with this will have to be monitored.   Hearing loss-appears she does have some wax buildup will prescribe Bryna Colander for 3 days and flush with warm water on day 4-also will order an ENT consult secondary to this apparently being a long-term situation     .    Depression-at this point is stableon Paxil .                             Insomnia she is on Ambien this was increased secondary patient requests saying the lower dose of Ambien was not effective she appears to have tolerated this well although will have to be watched--currently on 10 mg a day--      vitamin D deficiency-she has been started on supplementation Will update level      History of bronchitis- This recently has been stable has received azithromycin in the past Allergy symptoms again will start a short course for 5 days Clarinex  5 mg and monitor.  History of vomiting diarrhea last night-this appears resolved although she still says she feels somewhat weak-will update lab work including a CBC CMP tomorrow-monitor vital signs pulse ox every shift for 24 hours to keep an eye on this as well.  She does not appear acutely ill certainly but feels she just a bit  weak.  She also would like physical therapy with held for the next day or 2  [id number]-of note greater than 40 minutes spent assessing patient-addressing multiple concerns at bedside-and coordinating and formulating a plan of care for numerous diagnoses-of note greater than 50% of time spent coordinating plan of care with extensive patient input

## 2015-01-04 ENCOUNTER — Other Ambulatory Visit (HOSPITAL_COMMUNITY)
Admission: RE | Admit: 2015-01-04 | Discharge: 2015-01-04 | Disposition: A | Discharge status: Home / Self Care | Payer: Medicare Other | Source: Ambulatory Visit | Attending: Internal Medicine | Admitting: Internal Medicine

## 2015-01-04 DIAGNOSIS — M069 Rheumatoid arthritis, unspecified: Secondary | ICD-10-CM | POA: Diagnosis present

## 2015-01-04 DIAGNOSIS — D72829 Elevated white blood cell count, unspecified: Secondary | ICD-10-CM | POA: Insufficient documentation

## 2015-01-04 DIAGNOSIS — M6281 Muscle weakness (generalized): Secondary | ICD-10-CM | POA: Insufficient documentation

## 2015-01-04 LAB — COMPREHENSIVE METABOLIC PANEL
ALBUMIN: 3.5 g/dL (ref 3.5–5.0)
ALK PHOS: 69 U/L (ref 38–126)
ALT: 10 U/L — ABNORMAL LOW (ref 14–54)
AST: 16 U/L (ref 15–41)
Anion gap: 10 (ref 5–15)
BUN: 7 mg/dL (ref 6–20)
CHLORIDE: 90 mmol/L — AB (ref 101–111)
CO2: 30 mmol/L (ref 22–32)
Calcium: 8.7 mg/dL — ABNORMAL LOW (ref 8.9–10.3)
Creatinine, Ser: 0.73 mg/dL (ref 0.44–1.00)
GFR calc non Af Amer: >60 mL/min (ref >60)
Glucose, Bld: 104 mg/dL — ABNORMAL HIGH (ref 65–99)
POTASSIUM: 3.9 mmol/L (ref 3.5–5.1)
Sodium: 130 mmol/L — ABNORMAL LOW (ref 135–145)
Total Bilirubin: 0.6 mg/dL (ref 0.3–1.2)
Total Protein: 6.6 g/dL (ref 6.5–8.1)

## 2015-01-04 LAB — CBC WITH DIFFERENTIAL/PLATELET
Basophils Absolute: 0.1 10*3/uL (ref 0.0–0.1)
Basophils Relative: 1 % (ref 0–1)
Eosinophils Absolute: 0.2 10*3/uL (ref 0.0–0.7)
Eosinophils Relative: 3 % (ref 0–5)
HCT: 36 % (ref 36.0–46.0)
Hemoglobin: 11.9 g/dL — ABNORMAL LOW (ref 12.0–15.0)
Lymphocytes Relative: 30 % (ref 12–46)
Lymphs Abs: 2.7 10*3/uL (ref 0.7–4.0)
MCH: 30 pg (ref 26.0–34.0)
MCHC: 33.1 g/dL (ref 30.0–36.0)
MCV: 90.7 fL (ref 78.0–100.0)
MONO ABS: 1 10*3/uL (ref 0.1–1.0)
Monocytes Relative: 11 % (ref 3–12)
NEUTROS ABS: 4.9 10*3/uL (ref 1.7–7.7)
Neutrophils Relative %: 55 % (ref 43–77)
PLATELETS: 414 10*3/uL — AB (ref 150–400)
RBC: 3.97 MIL/uL (ref 3.87–5.11)
RDW: 12.7 % (ref 11.5–15.5)
WBC: 8.9 10*3/uL (ref 4.0–10.5)

## 2015-01-04 LAB — TSH: TSH: 1.113 u[IU]/mL (ref 0.350–4.500)

## 2015-01-05 ENCOUNTER — Non-Acute Institutional Stay (SKILLED_NURSING_FACILITY): Payer: Medicare Other | Admitting: Internal Medicine

## 2015-01-05 DIAGNOSIS — E871 Hypo-osmolality and hyponatremia: Secondary | ICD-10-CM

## 2015-01-05 DIAGNOSIS — E559 Vitamin D deficiency, unspecified: Secondary | ICD-10-CM

## 2015-01-05 DIAGNOSIS — R197 Diarrhea, unspecified: Secondary | ICD-10-CM

## 2015-01-05 LAB — VITAMIN D 25 HYDROXY (VIT D DEFICIENCY, FRACTURES): Vit D, 25-Hydroxy: 21.8 ng/mL — ABNORMAL LOW (ref 30.0–100.0)

## 2015-01-05 NOTE — Progress Notes (Signed)
Patient ID: Robin Blackburn, female   DOB: [birth year], 70 y.o.   MRN: [id number]            .   . This is an  acute visit  ACILITY: [facility]:   SNF   CHIEF COMPLAINT:   acute visit follow-up episode of vomiting-diarrhea      HISTORY OF PRESENT ILLNESS   patient is a 69 year old female who is been a long-term resident this facility.  I saw her yesterday for routine visit she stated that she had had an episode of vomiting the night before and apparently some diarrhea as well.  Labs were ordered which actually appear to be stable her white count is not elevated.   she states she had some diarrhea again today although nursing staff cannot really confirm this.  She still says her stomach feels sore but not acutely painful.  There has been no further episodes of vomiting.  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      Fracture of the right fifth metatarsal in September, now with fracture of the left fifth metatarsal.    Physical deconditioning.    Essential hypertension.    Neuropathy.     Hyponatremia.    Hypokalemia.    History of rheumatoid arthritis, fibromyalgia and reflex sympathetic dystrophy, according to the patient.  Hyperlipidemia.     CURRENT MEDICATIONS:  Medication list is reviewed.            Xanax 1 mg tablet every 6 hours p.r.n.     Plavix 75 q.d.       Bentyl 20 q.6 hours p.r.n., abdominal cramps.    Lomotil 1 tablet two times daily as needed for diarrhea or loose stool.     Microzide 12.5 daily.    Norco 7.5/3.5 q.4 daily.    Prevacid 30 mg b.i.d. daily.    Lidoderm patch to the  right knee.    Robaxin 750 mg q.6 p.r.n.     Zofran q.6 p.r.n.     Paxil 10 q.d.    K-dur 20 q.d.    Pravachol 10 q.d.     Restasis ophthalmic 1 drop b.i.d.    Sodium chloride nasal spray 1 spray, each nostril, b.i.d.    Ambien 10 mg q.h.s. P.r.n  Plaqueni 200 mg with lunch in July-100 mg in August     I note that aspirin and simvastatin were discontinued.    SOCIAL HISTORY:                HOUSING:  The patient tells me she lived in her own apartment.  There is apparently a four-stair entrance stairway that she needs to manage.   FUNCTIONAL STATUS:  She has in-home support which she pays for privately, according to her.    REVIEW OF SYSTEMS: In general does not complain of fever chills.  Skin is not complaining of rashes or itching.  Head ears eyes nose mouth and throat does not complaining of sore throat or visual changes  Is not complaining of head congestion  today  Also states she has some hearing difficulty bilaterally -- is receiving D Brox       CHEST/RESPIRATORY:  No shortness of breath. Or increased cough- CARDIAC:   No chest pain.     GI:    says her stomach still feels a little sore but does not complaining of acute pain no further vomiting she states she did have diarrhea earlier today  GU does not complain of suprapubic discomfort or dysuria.   MUSCULOSKELETAL:  Somewhat chronic complaints of generalized pain-   NeurologicSome previous history of complaints of neuropathic pain does not complain of numbness headache or dizziness currently.--  Psych-does have a history of anxiety this point appears to be relatively stable   .     PHYSICAL EXAMINATION:    she is afebrile pulse of 80 respirations 18 blood pressure pending s Gen.  pleasant elderly female in no distress sitting comfortably in her wheelchair   Skin is warm and dry.  she has somewhat thinning hair but this does not appear overtly changed from previous exams  Eyes pupils appear  reactive light sclera and conjunctiva are clear visual acuity appears intact.  ---  Nose could not really appreciate any active drainage                 CHEST/RESPIRATORY:  Clear air entry bilaterally. No labored breathing   CARDIOVASCULAR:   CARDIAC:   Heart sounds are normal rate and rhythm.  There are no murmurs.  She appears to be euvolemic--.   GASTROINTESTINAL: Abdomen is obese soft does not appear to be overtly tender -bowel sounds are positive--says her stomach in general just feels a little sore last day or so-- abdominal exam appears baseline with 2 days ago  LIVER/SPLEEN/KIDNEYS:  No liver, no spleen.  No tenderness.   GENITOURINARY:  BLADDER:   I did not note any bladder distention or overt tenderness MUSCULOSKELETAL:   EXTREMITIES:   BILATERAL LOWER EXTREMITIES:    Moves both at baseline strength-she has minimal lower extremity edema pedal pulses are intact bilaterally I do not note any deformities  Strength upper extremities bilaterally appears grossly intact again possibly since mild right-sided weakness but this is quite minimal    Psych-she is alert and oriented pleasant and appropriate.  Labs   01/04/2015.  Sodium 1:30 potassium 3.9 BUN 7 creatinine 0.73.  ALT 10 otherwise liver function tests within normal limits.  WBC 8.9 hemoglobin 11.9 platelets 414.  TSH 1.113.  Vitamin D 21.8  12/20/2014.  Sodium 131 potassium 4.1 BUN 9 creatinine 0.71.  Calcium 8.8.  12/08/2014.  Vitamin D 22.6.  12/04/2014.  WBC 8.9 hemoglobin 11.1 platelets 377.  W in 3.1 AST 13-ALT 12 otherwise liver function tests within normal limits.  Cholesterol 183-triglycerides 269-HDL 47-LDL 82  11/07/2014.  Sodium 131 potassium 4.3 BUN 10 creatinine 0.77.  WBC 7.6 hemoglobin 11.4 platelets 358.  Sedimentation rate 12.  RA factor 7.0  .  10/09/2014.  WBC 8.4 hemoglobin 11.7 platelets 325.  Sodium 132 potassium 4.4 BUN 9 creatinine 0.78 CO2  32.  09/09/2014.  Liver function tests within normal limits.  07/29/2014-TSH within normal limits at 1.463.  Of note recent vitamin D level on April 14 was low at 17.6  08/21/2014.  Sodium 134 potassium 3.7 BUN 9 creatinine 0.78.  WBC 9.2 hemoglobin 11.2 platelets 386   08/07/2014.  WBC 9.7 hemoglobin 10.8 platelets 392.  Sodium 135 potassium 3.6 BUN 8 creatinine 0.82  06/28/2014.  WBC 8.5 hemoglobin 12.2  platelets 375.  Sodium 135 potassium 4.4 BUN 8 creatinine 0.8  02/20/2014  Magnesium level II.1.  Cholesterol 253 triglycerides 309H LDL 62-LDL 129.  .    .     ASSESSMENT/PLAN:                      history of diarrhea-again the history on this is somewhat unclear - will check C. Difficile culture of any loose stool---also monitor vital signs twice a day for 48 hours to keep an eye on her status.  Clinically she appears to be stable - would suspect possibly she has a resolving GI virus but again will check the stool culture and monitor her .  I do note her labs were unremarkable white count is not elevated she has chronic hyponatremia 1:30 this appears to be baseline..  #2 vitamin D deficiency I was under the impression she was on supplementation however vitamin D level is low at 21.8 and per review of meds I could not really see the vitamin D supplementation this will be started    #3- hyponatremia- unclear etiology nonetheless this appears to be chronic although stable   [id number]-of note greater than 25 minutes spent assessing patient-reviewing her labs-addressing her concerns-and coordinating and formulating a plan of care with patient and  Nursing input- of note greater than 50% of time spent coordinating plan of care with nursing and patient input           x--.                 Hyponatremia and hypokalemia.  The etiology here is unclear. But appears to be stable during her stay here --will update a metabolic panel    History of  rheumatoid arthritis, fibromyalgia and reflex sympathetic dystrophy, according to the patient.  She has chronic pain issues, medication changes as noted above-       .   Hearing loss-     .       vitamin D deficiency-sel      .

## 2015-01-10 ENCOUNTER — Other Ambulatory Visit (HOSPITAL_COMMUNITY)
Admission: RE | Admit: 2015-01-10 | Discharge: 2015-01-10 | Disposition: A | Discharge status: Home / Self Care | Payer: Medicare Other | Source: Skilled Nursing Facility | Attending: Internal Medicine | Admitting: Internal Medicine

## 2015-01-10 DIAGNOSIS — D500 Iron deficiency anemia secondary to blood loss (chronic): Secondary | ICD-10-CM | POA: Diagnosis present

## 2015-01-10 LAB — OCCULT BLOOD X 1 CARD TO LAB, STOOL: Fecal Occult Bld: NEGATIVE

## 2015-01-16 ENCOUNTER — Encounter (HOSPITAL_COMMUNITY)
Admission: RE | Admit: 2015-01-16 | Discharge: 2015-01-16 | Disposition: A | Discharge status: Home / Self Care | Payer: Medicare Other | Source: Ambulatory Visit | Attending: Internal Medicine | Admitting: Internal Medicine

## 2015-01-16 DIAGNOSIS — I10 Essential (primary) hypertension: Secondary | ICD-10-CM | POA: Insufficient documentation

## 2015-01-16 LAB — C DIFFICILE QUICK SCREEN W PCR REFLEX
C DIFFICILE (CDIFF) TOXIN: NEGATIVE
C DIFFICLE (CDIFF) ANTIGEN: POSITIVE — AB

## 2015-01-16 LAB — OCCULT BLOOD X 1 CARD TO LAB, STOOL: Fecal Occult Bld: POSITIVE — AB

## 2015-01-17 ENCOUNTER — Encounter: Payer: Self-pay | Admitting: Internal Medicine

## 2015-01-17 ENCOUNTER — Other Ambulatory Visit: Payer: Self-pay

## 2015-01-17 ENCOUNTER — Telehealth: Payer: Self-pay | Admitting: Internal Medicine

## 2015-01-17 ENCOUNTER — Non-Acute Institutional Stay (SKILLED_NURSING_FACILITY): Payer: Medicare Other | Admitting: Internal Medicine

## 2015-01-17 DIAGNOSIS — J309 Allergic rhinitis, unspecified: Secondary | ICD-10-CM | POA: Diagnosis not present

## 2015-01-17 DIAGNOSIS — R195 Other fecal abnormalities: Secondary | ICD-10-CM | POA: Diagnosis not present

## 2015-01-17 LAB — CLOSTRIDIUM DIFFICILE BY PCR: Toxigenic C. Difficile by PCR: NEGATIVE

## 2015-01-17 MED ORDER — HYDROCODONE-ACETAMINOPHEN 7.5-325 MG PO TABS: ORAL_TABLET | ORAL | Status: DC

## 2015-01-17 NOTE — Telephone Encounter (Signed)
RX faxed to [facility] @ [phone]. Phone number [phone]  

## 2015-01-17 NOTE — Progress Notes (Signed)
Patient ID: Robin Blackburn, female   DOB: [birth year], 69 y.o.   MRN: [id number]             .   . This is an  acute visit  ACILITY: [facility]:   SNF   CHIEF COMPLAINT:    Acute visit secondary to occult positive stool-- allergy symptoms      HISTORY OF PRESENT ILLNESS   patient is a 69 year old female who is been a long-term resident this facility.-- I have noticed the lab done on occult blood testing which is come back  positive-I think these were ordered some time ago and previous ones have been negative.-- I believe this was ordered this past spring for hemoglobinthatt  droppedat that time-- -her hemoglobin has been stable at 11.9 area for some time although it was 14 back in April   Patient states that she does have hemorrhoids that bleed occasionally and she thinks this may have been the  Etiology of the 1 occult positive blood tests.-- her hemoglobin has been stable  recently on July 20 was 11.9 which is baseline for some time  although I do note it was 14 back in April  She does not complain of any increased weakness or chills I do note she is on a proton pump inhibitor she is on Plavix with a history of CVA.   She is also complaining of allergy symptoms and would like her Clarinex restarted.            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      Fracture of the right fifth metatarsal in September, now with fracture of the left fifth metatarsal.    Physical deconditioning.    Essential hypertension.    Neuropathy.     Hyponatremia.    Hypokalemia.    History of rheumatoid arthritis, fibromyalgia and reflex sympathetic dystrophy, according to the patient.  Hyperlipidemia.     CURRENT MEDICATIONS:  Medication list is reviewed.            Xanax 1 mg tablet every 6 hours p.r.n.     Plavix 75 q.d.       Bentyl 20 q.6 hours p.r.n., abdominal  cramps.    Lomotil 1 tablet two times daily as needed for diarrhea or loose stool.     Microzide 12.5 daily.    Norco 7.5/3.5 q.4 daily.    Prevacid 30 mg b.i.d. daily.    Lidoderm patch to the right knee.    Robaxin 750 mg q.6 p.r.n.     Zofran q.6 p.r.n.     Paxil 10 q.d.    K-dur 20 q.d.    Pravachol 10 q.d.     Restasis ophthalmic 1 drop b.i.d.    Sodium chloride nasal spray 1 spray, each nostril, b.i.d.    Ambien 10 mg q.h.s. P.r.n  Plaqueni 200 mg with lunch in July-100 mg in August     I note that aspirin and simvastatin were discontinued.    SOCIAL HISTORY:                HOUSING:  The patient tells me she lived in her own apartment.  There is apparently a four-stair entrance stairway that she needs to manage.   FUNCTIONAL STATUS:  She has in-home support which she pays for privately, according to her.  REVIEW OF SYSTEMS: In general does not complain of fever chills.  Skin is not complaining of rashes or itching.  Head ears eyes nose mouth and throat does not complaining of sore throat but is complaining of some nasal and throat drainage she ascribes to allergies         CHEST/RESPIRATORY:  No shortness of breath. Or increased cough- CARDIAC:   No chest pain.     GI:    Currently does not complain of abdominal discomfort nausea or vomiting  GU does not complain of suprapubic discomfort or dysuria.   MUSCULOSKELETAL:  Somewhat chronic complaints of generalized pain-   NeurologicSome previous history of complaints of neuropathic pain does not complain of numbness headache or dizziness currently.--  Psych-does have a history of anxiety this point appears to be relatively stable   .     PHYSICAL EXAMINATION:    temperature 97.5 pulse 78 respirations 20 blood pressure 144/74-115/52 most recently s Gen.  pleasant elderly female in no distress sitting comfortably in her wheelchair   Skin is warm and dry.  she has somewhat thinning hair but  this does not appear overtly changed from previous exams  Eyes pupils appear reactive light sclera and conjunctiva are clear visual acuity appears intact.  ---  Nose could not really appreciate any active drainagethe patient states she does have this at times he  Oropharynx is clear possibly small amount of drainage appreciated                   CHEST/RESPIRATORY:  Clear air entry bilaterally. No labored breathing   CARDIOVASCULAR:   CARDIAC:   Heart sounds are normal rate and rhythm.  There are no murmurs.  She appears to be euvolemic--.   GASTROINTESTINAL: Abdomen is obese soft does not appear to be overtly tender -bowel sounds are positive--  MUSCULOSKELETAL:   EXTREMITIES:   BILATERAL LOWER EXTREMITIES:    Moves both at baseline strength-she has minimal lower extremity edema pedal pulses are intact bilaterally I do not note any deformities  Strength upper extremities bilaterally appears grossly intact again possibly since mild right-sided weakness but this is quite minimal    Psych-she is alert and oriented pleasant and appropriate.  Labs   01/04/2015.  Sodium 1:30 potassium 3.9 BUN 7 creatinine 0.73.  ALT 10 otherwise liver function tests within normal limits.  WBC 8.9 hemoglobin 11.9 platelets 414.  TSH 1.113.  Vitamin D 21.8  12/20/2014.  Sodium 131 potassium 4.1 BUN 9 creatinine 0.71.  Calcium 8.8.  12/08/2014.  Vitamin D 22.6.  12/04/2014.  WBC 8.9 hemoglobin 11.1 platelets 377.  W in 3.1 AST 13-ALT 12 otherwise liver function tests within normal limits.  Cholesterol 183-triglycerides 269-HDL 47-LDL 82  11/07/2014.  Sodium 131 potassium 4.3 BUN 10 creatinine 0.77.  WBC 7.6 hemoglobin 11.4 platelets 358.  Sedimentation rate 12.  RA factor 7.0  .  10/09/2014.  WBC 8.4 hemoglobin 11.7 platelets 325.  Sodium 132 potassium 4.4 BUN 9 creatinine 0.78 CO2 32.  09/09/2014.  Liver function tests within normal limits.  07/29/2014-TSH  within normal limits at 1.463.  Of note recent vitamin D level on April 14 was low at 17.6  08/21/2014.  Sodium 134 potassium 3.7 BUN 9 creatinine 0.78.  WBC 9.2 hemoglobin 11.2 platelets 386   08/07/2014.  WBC 9.7 hemoglobin 10.8 platelets 392.  Sodium 135 potassium 3.6 BUN 8 creatinine 0.82  06/28/2014.  WBC 8.5 hemoglobin 12.2 platelets 375.  Sodium 135 potassium 4.4 BUN  8 creatinine 0.8  02/20/2014  Magnesium level II.1.  Cholesterol 253 triglycerides 309H LDL 62-LDL 129.  .    .     ASSESSMENT/PLAN  #1-guaiac positive stool-patient attributes this to a bleeding hemorrhoid other guaics have been negative-she has deferred a rectal exam today.  Her hemoglobin has been stable for the past 3 months-will write an order to continue to guaiac her stools 3 to get more samples also will update her CBC as well as metabolic panel next lab day.  At this point she has deferred a GI consult:---.  She does continue on a proton pump inhibitor at this point since hemoglobin is stable will continue the Plavix-with her history of CVA.   #2 Allergy symptoms-she has responded in the past well to Clarinex will restart this   CPT-99309                                 x--.                 Hyponatremia and hypokalemia.  The etiology here is unclear. But appears to be stable during her stay here --will update a metabolic panel    History of rheumatoid arthritis, fibromyalgia and reflex sympathetic dystrophy, according to the patient.  She has chronic pain issues, medication changes as noted above-       .   Hearing loss-     .       vitamin D deficiency-sel      .

## 2015-01-17 NOTE — Telephone Encounter (Signed)
[facility]/Antibiotic Stewardship NOTE  Robin Blackburn has been tested for C.difficile on 8/9 for having abdominal pain and loose stools. Her initial test shows antigen +/toxin negative, and her PCR test is NEGATIVE  This means that she is colonized with a non-toxigenic form of C.difficile and does not require treatment for C.difficile. Please pursue other work up for diarrhea. IF questions, can call infectious diseases on call.  [facility] [phone]

## 2015-01-18 ENCOUNTER — Encounter (HOSPITAL_COMMUNITY)
Admission: RE | Admit: 2015-01-18 | Discharge: 2015-01-18 | Disposition: A | Discharge status: Home / Self Care | Payer: Medicare Other | Source: Ambulatory Visit | Attending: Internal Medicine | Admitting: Internal Medicine

## 2015-01-18 ENCOUNTER — Encounter (HOSPITAL_COMMUNITY): Admission: RE | Admit: 2015-01-18 | Payer: Self-pay | Source: Skilled Nursing Facility | Admitting: Internal Medicine

## 2015-01-18 DIAGNOSIS — I10 Essential (primary) hypertension: Secondary | ICD-10-CM | POA: Diagnosis not present

## 2015-01-18 LAB — CBC
HCT: 37.3 % (ref 36.0–46.0)
HEMOGLOBIN: 12.2 g/dL (ref 12.0–15.0)
MCH: 30 pg (ref 26.0–34.0)
MCHC: 32.7 g/dL (ref 30.0–36.0)
MCV: 91.9 fL (ref 78.0–100.0)
Platelets: 380 10*3/uL (ref 150–400)
RBC: 4.06 MIL/uL (ref 3.87–5.11)
RDW: 12.6 % (ref 11.5–15.5)
WBC: 8 10*3/uL (ref 4.0–10.5)

## 2015-01-18 LAB — BASIC METABOLIC PANEL
Anion gap: 7 (ref 5–15)
BUN: 9 mg/dL (ref 6–20)
CHLORIDE: 93 mmol/L — AB (ref 101–111)
CO2: 32 mmol/L (ref 22–32)
Calcium: 8.6 mg/dL — ABNORMAL LOW (ref 8.9–10.3)
Creatinine, Ser: 0.68 mg/dL (ref 0.44–1.00)
GFR calc Af Amer: >60 mL/min (ref >60)
GLUCOSE: 97 mg/dL (ref 65–99)
POTASSIUM: 4.2 mmol/L (ref 3.5–5.1)
SODIUM: 132 mmol/L — AB (ref 135–145)

## 2015-01-25 DIAGNOSIS — I10 Essential (primary) hypertension: Secondary | ICD-10-CM | POA: Diagnosis not present

## 2015-01-25 LAB — OCCULT BLOOD X 1 CARD TO LAB, STOOL: FECAL OCCULT BLD: NEGATIVE

## 2015-01-30 ENCOUNTER — Non-Acute Institutional Stay (SKILLED_NURSING_FACILITY): Payer: Medicare Other | Admitting: Internal Medicine

## 2015-01-30 ENCOUNTER — Encounter: Payer: Self-pay | Admitting: Internal Medicine

## 2015-01-30 DIAGNOSIS — I10 Essential (primary) hypertension: Secondary | ICD-10-CM

## 2015-01-30 DIAGNOSIS — Z8673 Personal history of transient ischemic attack (TIA), and cerebral infarction without residual deficits: Secondary | ICD-10-CM | POA: Diagnosis not present

## 2015-01-30 DIAGNOSIS — S92301S Fracture of unspecified metatarsal bone(s), right foot, sequela: Secondary | ICD-10-CM

## 2015-01-30 DIAGNOSIS — J3089 Other allergic rhinitis: Secondary | ICD-10-CM

## 2015-01-30 DIAGNOSIS — R195 Other fecal abnormalities: Secondary | ICD-10-CM | POA: Diagnosis not present

## 2015-01-30 DIAGNOSIS — M069 Rheumatoid arthritis, unspecified: Secondary | ICD-10-CM

## 2015-01-30 NOTE — Progress Notes (Signed)
Patient ID: Robin Blackburn, female   DOB: [birth year], 69 y.o.   MRN: [id number]            .   . This is a routine  acute visit  ACILITY: [facility]:   SNF   CHIEF COMPLAINT: Medical management of chronic medical conditions including bilateral metatarsal fractures-hypertension-depression-anxiety-chronic pain-rheumatoid arthritis-history CVA-- Acute visit secondary to suspected recurrent allergic rhinitis      HISTORY OF PRESENT ILLNESS:  This is a patient whom we actually had in the building from September to mid October.  At that point, she had suffered an acute thalamic infarct and, after her arrival here, we discovered her to have a fracture of the right fifth metatarsal for which she was referred to Orthopedics.  She was discharged to her own apartment which apparently has four stairs to enter.         She was discovered during this admission to have a minimally displaced fracture of the left fifth metatarsal and, as noted, has the original fracture that we diagnosed in September --.  These issues have been relatively stable and followed by orthopedics--in fact she is now able to wear tennis shoes this appears to be quite stable which is encouraging   does complain of some chronic pain and muscle discomfort-she is receiving Vicodin-7.5 325 every 4 hours when necessary- She is also on Robaxin  increased this to 7  50 mg every 6 hours when necessary-this appears to be helping some She continues to complain of somewhat generalized pain.  Ishe has seen her rheumatologist who has restarted her Plaqueni--however she apparently is refusing this at times  .  She also at times has received azithromycin for suspected bronchitis- This complicated with a history of recurrent allergic rhinitis-this was originally treated earlier this month with Clarinex but she states now that she feels she's having increased  Clear drainage and head congestion as well as  sore throat-this apparently worsened when she was taken off the Clarinex-  Was also noted earlier this month she did have a guaiac positive stool to others have been negative-she also has a history of hemorrhoids was thought possibly this may have been a bleeding hemorrhoid which contributed to the one positive reading-her hemoglobin actually stable to somewhat improved-she is on a proton pump inhibitor I did discuss this today and she does not at this point want any GI workup-she feels this was a bleeding hemorrhoid and does not want any workup at this time     Patient does have a history of anxiety she is on Xanax as needed this appears to be relatively stable.  In regards to depression she is on Paxil which appears to be stable as well.  She does have a history of hypertension she is on Microzide recent blood pressures appear to be relatively stable  129/55-139/74-at this point will monitor since she does not appear to be consistently elevated  She  also complained of some bilateral hearing loss she feels this has been essentially since she had her CVA- We did give her a course of D Brox for some earwax issues-however during loss persisted and she has actually seen an ENT who actually manually disimpacted some earwax she does not complain of hearing difficulties today  .              PAST MEDICAL HISTORY/PROBLEM LIST:         History of a left lateral thalamus  infarction in September of this year.      Fracture of the right fifth metatarsal in September, now with fracture of the left fifth metatarsal.    Physical deconditioning.    Essential hypertension.    Neuropathy.     Hyponatremia.    Hypokalemia.    History of rheumatoid arthritis, fibromyalgia and reflex sympathetic dystrophy, according to the patient.  Hyperlipidemia.     CURRENT MEDICATIONS:  Medication list is reviewed.            Xanax 1 mg tablet every 6 hours p.r.n.     Plavix 75 q.d.        Bentyl 20 q.6 hours p.r.n., abdominal cramps.    Lomotil 1 tablet two times daily as needed for diarrhea or loose stool.     Microzide 12.5 daily.    Norco 7.5/3.5 q.4 daily.    Prevacid 30 mg b.i.d. daily.    Lidoderm patch to the right knee.    Robaxin 750 mg q.6 p.r.n.     Zofran q.6 p.r.n.     Paxil 10 q.d.    K-dur 20 q.d.    Pravachol 10 q.d.     Restasis ophthalmic 1 drop b.i.d.    Sodium chloride nasal spray 1 spray, each nostril, b.i.d.    Ambien 10 mg q.h.s. P.r.n  Plaqueni 200 mg with lunch in July-100 mg in August     I note that aspirin and simvastatin were discontinued.    SOCIAL HISTORY:                HOUSING:  The patient tells me she lived in her own apartment.  There is apparently a four-stair entrance stairway that she needs to manage.   FUNCTIONAL STATUS:  She has in-home support which she pays for privately, according to her.    REVIEW OF SYSTEMS: In general does not complain of fever chills feel she has some increased head congestion sinusitis as noted above.  Skin is not complaining of rashes or itching.  Head ears eyes nose mouth and throat  Is complaining of a sore throat and head congestion headache that she associates with sinusitis- Does not complain of hearing difficulty today       CHEST/RESPIRATORY:  No shortness of breath. Or increased cough- CARDIAC:   No chest pain.     GI:   Is not complaining of nausea vomiting diarrhea constipation or abdominal discomfort today she has had times complain of some vomiting and diarrhea but that is not the case today  GU does not complain of suprapubic discomfort or dysuria.   MUSCULOSKELETAL:  Somewhat chronic complaints of generalized pain-   NeurologicSome previous history of complaints of neuropathic pain does not complain of numbness headache or dizziness currently.--  Psych-does have a history of anxiety this point appears to be relatively stable   increased her Xanax back to 1 mg  every 6 hours secondary to increased anxiety symptoms apparently this is helping.     PHYSICAL EXAMINATION:  She is afebrile pulses 70 respirations 18 blood pressure 129/58-weight appears to be stable at 206.4 s Gen.  pleasant elderly female in no distress sitting comfortably in her wheelchair   Skin is warm and dry.  she has somewhat thinning hair but this does not appear overtly changed from previous exams  Head-some tenderness to palpation forehead  Eyes pupils appear reactive light sclera and conjunctiva are clear visual acuity appears intact.    Oropharynx is clear mucous membranes  moist possibly some clear drainage---  Nose could not really appreciate any active drainage                 CHEST/RESPIRATORY:  Clear air entry bilaterally. No labored breathing   CARDIOVASCULAR:   CARDIAC:   Heart sounds are normal rate and rhythm.  There are no murmurs.  She appears to be euvolemic--.   GASTROINTESTINAL: Abdomen is obese soft does not appear to be overtly tender -bowel sounds are positive--  L  GENITOURINARY:  BLADDER:   I did not note any bladder distention or overt tenderness MUSCULOSKELETAL:   EXTREMITIES:   BILATERAL LOWER EXTREMITIES:    Moves both at baseline strength-she has minimal lower extremity edema  bilaterally I do not note any deformities  Strength upper extremities bilaterally appears grossly intact again possibly since mild right-sided weakness but this is quite minimal  t-.  Neuro I could not really appreciate any overt lateralizing findings she does have a history of CVA her speech is clear cranial nerves appear intact. Does have a history of fairly mild right-sided weakness --minimal deficit here     .    Psych-she is alert and oriented pleasant and appropriate.  Labs  January 18 2015.  Sodium 132 potassium 4.2 BUN 9 creatinine 0.68.  WBC 8.0 hemoglobin 12.2 platelets 380  12/20/2014.  Sodium 131 potassium 4.1 BUN 9 creatinine  0.71.  Calcium 8.8.  12/08/2014.  Vitamin D 22.6.  12/04/2014.  WBC 8.9 hemoglobin 11.1 platelets 377.  W in 3.1 AST 13-ALT 12 otherwise liver function tests within normal limits.  Cholesterol 183-triglycerides 269-HDL 47-LDL 82  11/07/2014.  Sodium 131 potassium 4.3 BUN 10 creatinine 0.77.  WBC 7.6 hemoglobin 11.4 platelets 358.  Sedimentation rate 12.  RA factor 7.0  .  10/09/2014.  WBC 8.4 hemoglobin 11.7 platelets 325.  Sodium 132 potassium 4.4 BUN 9 creatinine 0.78 CO2 32.  09/09/2014.  Liver function tests within normal limits.  07/29/2014-TSH within normal limits at 1.463.  Of note recent vitamin D level on April 14 was low at 17.6  08/21/2014.  Sodium 134 potassium 3.7 BUN 9 creatinine 0.78.  WBC 9.2 hemoglobin 11.2 platelets 386   08/07/2014.  WBC 9.7 hemoglobin 10.8 platelets 392.  Sodium 135 potassium 3.6 BUN 8 creatinine 0.82  06/28/2014.  WBC 8.5 hemoglobin 12.2 platelets 375.  Sodium 135 potassium 4.4 BUN 8 creatinine 0.8  02/20/2014  Magnesium level II.1.  Cholesterol 253 triglycerides 309H LDL 62-LDL 129.  .    .     ASSESSMENT/PLAN:                       What appears to be somewhat chronic allergy symptoms with now recurrent sore throat head congestion drainage-will make her Clarinex routine in monitor-at times she does request an antibiotic azithromycin for suspected sinusitis-I did discuss this extensively with her about our efforts to try to minimize antibiotic use secondary to multiple side effects and increased resistance-also increased risk of C. difficile-she did express understanding and is willing to try the Clarinex routinelyto see ifu this will help if not effective certainly will have to reevaluate-  History of guaiac-positive stool-this is a 1 time positive reading-her hemoglobin has been stable she is on a proton pump inhibitor--as noted above per discussion today she does not want a GI workup at this  time    chronic pain muscle spasms -- rheumatoid arthritis-patient is on a significant dose of Vicodin as noted  above- We have increased her Robaxin 750 mg every 6 hours previously apparently this is helping some-   her statin was discontinued secondary to her concerns this may be causing some generalized pain.  She also is now on Plaqueni--apparently takes this sporadically  Still at times complains of  Pain  At times but this does not appear to be increased-at this point will monitor since she is on significant medication--   In regards hyperlipidemia this appears stable with cholesterol 183 triglycerides 269 HDL 47 LDL of 82 on lab done 12/04/2014 at this point will monitor she prefers to be off the statin     -question increased anxiety Her Xanax recently was increased back to 1 mg every 6 hours when necessary this appears to be helping     .    History of remote left thalamic CVA.  Very little residual of this she continues on Plavix--.        History of metatarsal fractures bilaterally-this is followed by orthopedics--appears to be stable now essentially wearing street shoes which is encouraging.         Hyponatremia and hypokalemia.  The etiology here is unclear. But appears to be stable during her stay here --will update a metabolic panel    History of rheumatoid arthritis, fibromyalgia and reflex sympathetic dystrophy, according to the patient.  She has chronic pain issues, medication changes as noted above-         Hypertension-at this point appearsvariable  she is on hydrochlorothiazide recent blood pressures are variable at this point will continue to monitor--per review I do not see consistent elevations    GERD this appears stable on Prilosec she is no longer complaining symptoms with this will have to be monitored.   Hearing loss- Again she has seen ENT with manual removal of earwax that was quite significant apparently-she is not complaining of  hearing difficulties this afternoon    .    Depression-at this point is stableon Paxil .                             Insomnia she is on Ambien this was increased secondary patient requests saying the lower dose of Ambien was not effective she appears to have tolerated this well although will have to be watched--currently on 10 mg a day--      vitamin D deficiency-she has been started on supplementation--level was 21.8 in late July           CPT-99310-of note greater than 40 minutes spent assessing patient-addressing multiple concerns at bedside-and coordinating and formulating a plan of care for numerous diagnoses-of note greater than 50% of time spent coordinating plan of care with extensive patient input

## 2015-01-31 ENCOUNTER — Encounter: Payer: Self-pay | Admitting: Internal Medicine

## 2015-01-31 ENCOUNTER — Non-Acute Institutional Stay (SKILLED_NURSING_FACILITY): Payer: Medicare Other | Admitting: Internal Medicine

## 2015-01-31 DIAGNOSIS — J328 Other chronic sinusitis: Secondary | ICD-10-CM

## 2015-01-31 NOTE — Progress Notes (Signed)
Patient ID: Robin Blackburn, female   DOB: 26-Nov-1945, 69 y.o.   MRN: [id number]             .   . This is an  acute visit  ACILITY: [place]:   SNF   CHIEF COMPLAINT: Acute visit to Follow-up allergic rhinitis question sinusitis      HISTORY OF PRESENT ILLNESS:  --This is a pleasant 69 year old female who was seen yesterday for routine visit-she also has a history of sinusitis bronchitis at times she has received azithromycin- This is complicated with a history of allergic rhinitis she occasionally receives Clarinex and actually have restarted this secondary to complaints of some clear drainage-patient thought she was developing sinusitis history said she had some headache stuffiness she says the classic signs of sinusitis-we consider restarting azithromycin but was hesitant secondary to her frequent use of the antibiotic-and we restarted the Clarinex and said to [facility] patient agreed to.  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      Fracture of the right fifth metatarsal in September, now with fracture of the left fifth metatarsal.    Physical deconditioning.    Essential hypertension.    Neuropathy.     Hyponatremia.    Hypokalemia.    History of rheumatoid arthritis, fibromyalgia and reflex sympathetic dystrophy, according to the patient.  Hyperlipidemia.     CURRENT MEDICATIONS:  Medication list is reviewed.            Xanax 1 mg tablet every 6 hours p.r.n.     Plavix 75 q.d.       Bentyl 20 q.6  hours p.r.n., abdominal cramps.    Lomotil 1 tablet two times daily as needed for diarrhea or loose stool.     Microzide 12.5 daily.    Norco 7.5/3.5 q.4 daily.    Prevacid 30 mg b.i.d. daily.    Lidoderm patch to the right knee.    Robaxin 750 mg q.6 p.r.n.     Zofran q.6 p.r.n.     Paxil 10 q.d.    K-dur 20 q.d.    Pravachol 10 q.d.     Restasis ophthalmic 1 drop b.i.d.    Sodium chloride nasal spray 1 spray, each nostril, b.i.d.    Ambien 10 mg q.h.s. P.r.n  Plaqueni 200 mg with lunch in July-100 mg in August     I note that aspirin and simvastatin were discontinued.    SOCIAL HISTORY:                HOUSING:  The patient tells me she lived in her own apartment.  There is apparently a four-stair entrance stairway that she needs to manage.   FUNCTIONAL STATUS:  She has in-home support which she pays for privately, according to her.    REVIEW OF SYSTEMS: In general does not complain of fever chills feel she has some increased head congestion sinusitis as noted above. He says this is progressing since yesterday  Skin is not complaining of rashes or itching.  Head ears eyes nose mouth and throat  Is complaining of a sore throat and head congestion headache that she associates with sinusitis- Does not complain of hearing difficulty today       CHEST/RESPIRATORY:  No shortness of breath. Or increased cough- CARDIAC:   No chest pain.     GI:   Is not complaining of nausea vomiting diarrhea constipation or abdominal discomfort today she has had times complain of some vomiting and diarrhea but that is not the case today  GU does not complain of suprapubic discomfort or dysuria.   MUSCULOSKELETAL:  Somewhat chronic complaints of generalized pain-      .     PHYSICAL EXAMINATION:  She is afebrile pulse 72 respirations of 19 s Gen.  pleasant elderly female in no distress sitting comfortably in her wheelchair   Skin is warm and dry.    Head-some tenderness  to palpation forehead  Eyes pupils appear reactive light sclera and conjunctiva are clear visual acuity appears intact.    Oropharynx is clear mucous membranes moist possibly some clear drainage---  Nose could not really appreciate any active drainage                 CHEST/RESPIRATORY:  Clear air entry bilaterally. No labored breathing   CARDIOVASCULAR:   CARDIAC:   Heart sounds are normal rate and rhythm.  There are no murmurs.  She appears to be euvolemic--.   G :       t-.  Neuro I could not really appreciate any overt lateralizing findings she does have a history of CVA her speech is clear cranial nerves appear intact. Does have a history of fairly mild right-sided weakness --minimal deficit here     .    Psych-she is alert and oriented pleasant and appropriate.  Labs  January 18 2015.  Sodium 132 potassium 4.2 BUN 9 creatinine 0.68.  WBC 8.0 hemoglobin 12.2 platelets 380  12/20/2014.  Sodium 131 potassium 4.1 BUN 9 creatinine 0.71.  Calcium 8.8.  12/08/2014.  Vitamin D 22.6.  12/04/2014.  WBC 8.9 hemoglobin 11.1 platelets 377.  W in 3.1 AST 13-ALT 12 otherwise liver function tests within normal limits.  Cholesterol 183-triglycerides 269-HDL 47-LDL 82  11/07/2014.  Sodium 131 potassium 4.3 BUN 10 creatinine 0.77.  WBC 7.6 hemoglobin 11.4 platelets 358.  Sedimentation rate 12.  RA factor 7.0  .  10/09/2014.  WBC 8.4 hemoglobin 11.7 platelets 325.  Sodium 132 potassium 4.4 BUN 9 creatinine 0.78 CO2 32.  09/09/2014.  Liver function tests within normal limits.  07/29/2014-TSH within normal limits at 1.463.  Of note recent vitamin D level on April 14 was low at 17.6  08/21/2014.  Sodium 134 potassium 3.7 BUN 9 creatinine 0.78.  WBC 9.2 hemoglobin 11.2 platelets 386   08/07/2014.  WBC 9.7 hemoglobin 10.8 platelets 392.  Sodium 135 potassium 3.6 BUN 8 creatinine 0.82  06/28/2014.  WBC 8.5 hemoglobin 12.2 platelets  375.  Sodium 135 potassium 4.4 BUN 8 creatinine 0.8  02/20/2014  Magnesium level II.1.  Cholesterol 253 triglycerides 309H LDL 62-LDL 129.  .    .     ASSESSMENT/PLAN:                     Sinusitis-allergy symptoms-as noted above we have started Clarinex but patient feels symptoms are worsening with increased headache congestion she says the classic signs of sinusitis in her case-she is  quite adamant she needs these azithromycin and thus will start the Z-Pak and continue to monitor with vital signs daily-at this point not any distress-g we have been trying to be judicious with the use of antibiotic the patient is quite insistent that her symptoms are worsening and that azithromycin is quite effective  [id number]

## 2015-02-13 ENCOUNTER — Encounter (HOSPITAL_COMMUNITY)
Admission: AD | Admit: 2015-02-13 | Discharge: 2015-02-13 | Disposition: A | Discharge status: Home / Self Care | Payer: Medicare Other | Source: Skilled Nursing Facility | Attending: Internal Medicine | Admitting: Internal Medicine

## 2015-02-13 DIAGNOSIS — E559 Vitamin D deficiency, unspecified: Secondary | ICD-10-CM | POA: Insufficient documentation

## 2015-02-14 ENCOUNTER — Ambulatory Visit (HOSPITAL_COMMUNITY): Payer: Medicare Other | Attending: Internal Medicine

## 2015-02-14 ENCOUNTER — Non-Acute Institutional Stay (SKILLED_NURSING_FACILITY): Payer: Medicare Other | Admitting: Internal Medicine

## 2015-02-14 ENCOUNTER — Encounter: Payer: Self-pay | Admitting: Internal Medicine

## 2015-02-14 ENCOUNTER — Inpatient Hospital Stay (HOSPITAL_COMMUNITY): Payer: Medicare Other | Attending: Internal Medicine

## 2015-02-14 DIAGNOSIS — I10 Essential (primary) hypertension: Secondary | ICD-10-CM | POA: Diagnosis not present

## 2015-02-14 DIAGNOSIS — M069 Rheumatoid arthritis, unspecified: Secondary | ICD-10-CM | POA: Diagnosis not present

## 2015-02-14 DIAGNOSIS — M25562 Pain in left knee: Secondary | ICD-10-CM | POA: Diagnosis not present

## 2015-02-14 DIAGNOSIS — G894 Chronic pain syndrome: Secondary | ICD-10-CM

## 2015-02-14 DIAGNOSIS — J328 Other chronic sinusitis: Secondary | ICD-10-CM | POA: Diagnosis not present

## 2015-02-14 LAB — VITAMIN D 25 HYDROXY (VIT D DEFICIENCY, FRACTURES): Vit D, 25-Hydroxy: 24.5 ng/mL — ABNORMAL LOW (ref 30.0–100.0)

## 2015-02-14 NOTE — Progress Notes (Signed)
Patient ID: Robin Blackburn, female   DOB: Nov 09, 1945, 69 y.o.   MRN: [id number]             .   . This is a routine  acute visit  ACILITY: [place]:   SNF   CHIEF COMPLAINT: Medical management of chronic medical conditions including bilateral metatarsal fractures-hypertension-depression-anxiety-chronic pain-rheumatoid arthritis-history CVA-- Acute visit secondary to left -knee pain      HISTORY OF PRESENT ILLNESS:  This is a patient whom we actually had in the building from September to mid October.  At that point, she had suffered an acute thalamic infarct and, after her arrival here, we discovered her to have a fracture of the right fifth metatarsal for which she was referred to Orthopedics.  She was discharged to her own apartment which apparently has four stairs to enter.         She was discovered during this admission to have a minimally displaced fracture of the left fifth metatarsal and, as noted, has the original fracture that we diagnosed in September --.  These issues have been relatively stable and followed by orthopedics--in fact she is now able to wear tennis shoes this appears to be quite stable which is encouraging  She is complaining of some left  knee pain does not note any recent trauma falls   does complain of some chronic pain and muscle discomfort-she is receiving Vicodin-7.5 325 every 4 hours when necessary- She is also on Robaxin  increased this to 7  50 mg every 6 hours when necessary-this appears to be helping some--although she continues to complain of pain at times-   Ishe has seen her rheumatologist who has restarted her Plaqueni--however she apparently is refusing this --per discussion with her today apparently she will start taking this tomorrow-this may help with her pain issues as well  .  She also at times has received azithromycin for suspected bronchitis- This complicated with a history of recurrent allergic  rhinitis She recently completed another course of azithromycin as well as Clarinex she is not complaining of allergy symptoms today-  Was also noted last month she did have a guaiac positive stool  others have been negative-she also has a history of hemorrhoids was thought possibly this may have been a bleeding hemorrhoid which contributed to the one positive reading-her hemoglobin actually stable to somewhat improved-she is on a proton pump inhibitor I did discuss this  and she does not at this point want any GI workup-she feels this was a bleeding hemorrhoid and does not want any workup for now---     Patient does have a history of anxiety she is on Xanax as needed this appears to be relatively stable.  In regards to depression she is on Paxil which appears to be stable as well.  She does have a history of hypertension she is on Microzide recent blood pressures appear to be relatively stable  137/71-111/59-at this point will monitor since she does not appear to be consistently elevated  She  also complained of some bilateral hearing loss she feels this has been essentially since she had her CVA- We did give her a course of D Brox for some earwax issues-however during loss persisted and she has actually seen an ENT who actually manually disimpacted some earwax she does not complain of hearing difficulties today  .              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      Fracture of the right fifth metatarsal in September, now with fracture of the left fifth metatarsal.    Physical deconditioning.    Essential hypertension.    Neuropathy.     Hyponatremia.    Hypokalemia.    History of rheumatoid arthritis, fibromyalgia and reflex sympathetic dystrophy, according to the patient.  Hyperlipidemia.     CURRENT MEDICATIONS:  Medication list is reviewed.            Xanax 1 mg tablet every 6  hours p.r.n.     Plavix 75 q.d.       Bentyl 20 q.6 hours p.r.n., abdominal cramps.    Lomotil 1 tablet two times daily as needed for diarrhea or loose stool.     Microzide 12.5 daily.    Norco 7.5/3.5 q.4 daily.    Prevacid 30 mg b.i.d. daily.    Lidoderm patch to the right knee.    Robaxin 750 mg q.6 p.r.n.     Zofran q.6 p.r.n.     Paxil 10 q.d.    K-dur 20 q.d.    Pravachol 10 q.d.     Restasis ophthalmic 1 drop b.i.d.    Sodium chloride nasal spray 1 spray, each nostril, b.i.d.    Ambien 10 mg q.h.s. P.r.n  Plaqueni 200 mg with lunch in July-100 mg in August     I note that aspirin and simvastatin were discontinued.    SOCIAL HISTORY:                HOUSING:  The patient lived in her own apartment.  There is apparently a four-stair entrance stairway that she needs to manage.   FUNCTIONAL STATUS:  She has in-home support which she pays for privately, according to her.    REVIEW OF SYSTEMS: In general does not complain of fever chills.  Skin is not complaining of rashes or itching.  Head ears eyes nose mouth and throat  Does not complain of headache congestion sore throat nasal drainage- Does not complain of hearing difficulty today       CHEST/RESPIRATORY:  No shortness of breath. Or increased cough- CARDIAC:   No chest pain.     GI:   Is not complaining of nausea vomiting diarrhea constipation or abdominal discomfort today she has had times complain of some vomiting and diarrhea but that is not the case today  GU does not complain of suprapubic discomfort or dysuria.   MUSCULOSKELETAL:  Somewhat chronic complaints of generalized pain is complaining more specifically of left knee pain today-   NeurologicSome previous history of complaints of neuropathic pain does not complain of numbness headache or dizziness currently.--  Psych-does have a history of anxiety this point appears to be relatively stable   increased her Xanax back to 1 mg every 6 hours  secondary to increased anxiety symptoms apparently this is helping.     PHYSICAL EXAMINATION:   Temperature 98.0 pulse 78 respirations 18 blood pressure 137/71 s Gen.  pleasant elderly female in no distress sitting comfortably in her wheelchair   Skin is warm and dry.  she has somewhat thinning hair but this does not appear overtly changed from previous exams  Head-some tenderness to palpation forehead  Eyes pupils appear reactive light sclera and conjunctiva are clear visual acuity appears intact.    Oropharynx is clear mucous membranes moist  possibly some clear drainage---  Nose could not really appreciate any active drainage                 CHEST/RESPIRATORY:  Clear air entry bilaterally. No labored breathing   CARDIOVASCULAR:   CARDIAC:   Heart sounds are normal rate and rhythm.  There are no murmurs.  She appears to be euvolemic--.   GASTROINTESTINAL: Abdomen is obese soft does not appear to be overtly tender -bowel sounds are positive--  L  GENITOURINARY:  BLADDER:   I did not note any bladder distention or overt tenderness MUSCULOSKELETAL:   EXTREMITIES:   BILATERAL LOWER EXTREMITIES:    Moves both at baseline strength-she has minimal lower extremity edema  bilaterally  I do not note any deformities of the left foot there is some mild edema of the knees bilaterally with mild tenderness to palpation of the left knee anterior knee area-this does not appear to be noteably t warm or erythematous  Strength upper extremities bilaterally appears grossly intact again possibly since mild right-sided weakness but this is quite minimal  t-.  Neuro I could not really appreciate any overt lateralizing findings she does have a history of CVA her speech is clear cranial nerves appear intact. Does have a history of fairly mild right-sided weakness --minimal deficit here     .    Psych-she is alert and oriented pleasant and appropriate.  Labs  02/12/2014-vitamin D level XXIV.5  this appears to be rising  January 18 2015.  Sodium 132 potassium 4.2 BUN 9 creatinine 0.68.  WBC 8.0 hemoglobin 12.2 platelets 380  12/20/2014.  Sodium 131 potassium 4.1 BUN 9 creatinine 0.71.  Calcium 8.8.  12/08/2014.  Vitamin D 22.6.  12/04/2014.  WBC 8.9 hemoglobin 11.1 platelets 377.  W in 3.1 AST 13-ALT 12 otherwise liver function tests within normal limits.  Cholesterol 183-triglycerides 269-HDL 47-LDL 82  11/07/2014.  Sodium 131 potassium 4.3 BUN 10 creatinine 0.77.  WBC 7.6 hemoglobin 11.4 platelets 358.  Sedimentation rate 12.  RA factor 7.0  .  10/09/2014.  WBC 8.4 hemoglobin 11.7 platelets 325.  Sodium 132 potassium 4.4 BUN 9 creatinine 0.78 CO2 32.  09/09/2014.  Liver function tests within normal limits.  07/29/2014-TSH within normal limits at 1.463.  Of note recent vitamin D level on April 14 was low at 17.6  08/21/2014.  Sodium 134 potassium 3.7 BUN 9 creatinine 0.78.  WBC 9.2 hemoglobin 11.2 platelets 386   08/07/2014.  WBC 9.7 hemoglobin 10.8 platelets 392.  Sodium 135 potassium 3.6 BUN 8 creatinine 0.82  06/28/2014.  WBC 8.5 hemoglobin 12.2 platelets 375.  Sodium 135 potassium 4.4 BUN 8 creatinine 0.8  02/20/2014  Magnesium level II.1.  Cholesterol 253 triglycerides 309H LDL 62-LDL 129.  .    .     ASSESSMENT/PLAN:                                                                          What appears to be somewhat chronic allergy symptoms--with concerns for sinusitis in the past as this appears to have been stable recently-although I suspect there will be reoccurrence per her history-at this point monitor  History of guaiac-positive stool-this is a 1 time positive reading-her  hemoglobin has been stable she is on a proton pump inhibitor--as noted above-- she does not want a GI workup at this time--will update her CBC    chronic pain muscle spasms -- rheumatoid arthritis-patient is on a significant dose of  Vicodin as noted above- We have increased her Robaxin 750 mg every 6 hours previously apparently this is helping some-also will be starting herPlaqenil tomorrow    her statin was discontinued secondary to her concerns this may be causing some generalized pain.     Increase left leg and knee pain-we will check an x-ray I suspect this is more arthritic chronic-I did discuss pain management -- she is on extensive pain medication would be hesitant to increase-she expressed understanding we will see if the plaquenil helps     In regards hyperlipidemia this appears stable with cholesterol 183 triglycerides 269 HDL 47 LDL of 82 on lab done 12/04/2014 at this point will monitor she prefers to be off the statin     -question increased anxiety Her Xanax recently was increased back to 1 mg every 6 hours when necessary this appears to be helping     .    History of remote left thalamic CVA.  Very little residual of this she continues on Plavix--.        History of metatarsal fractures bilaterally-this is followed by orthopedics--appears to be stable now essentially wearing street shoes which is encouraging.         Hyponatremia and hypokalemia.  The etiology here is unclear. But appears to be stable during her stay here --will update a metabolic panel    History of rheumatoid arthritis, fibromyalgia and reflex sympathetic dystrophy, according to the patient.  She has chronic pain issues, medication changes as noted above-Plaquenil will be restarted                                                                                 Hypertension-at this point appears stable   she is on hydrochlorothiazide-per review I do not see consistent elevations    GERD this appears stable on Prilosec she is no longer complaining symptoms with this will have to be monitored.   Hearing loss- Again she has seen ENT with manual removal of earwax that was quite significant apparently-she is not  complaining of hearing difficulties this afternoon    .    Depression-at this point is stableon Paxil .                             Insomnia she is on Ambien this was increased secondary patient requests saying the lower dose of Ambien was not effective she appears to have tolerated this well although will have to be watched--currently on 10 mg a day--      vitamin D deficiency-she has been started on supplementation--ulcers level was rising at 24.5   Addendum-we have received results of the  Left knee x-ray does not show any acute abnormality-showed degenerative changes with a small supra patella effusion which is baseline with what I saw on exam at this point will continue current pain management and monitor   [id number]-of  note greater than 35 minutes spent assessing patient-addressing her concerns-reviewing her chart-and coordinating and formulating a plan of care for numerous diagnoses-of note greater than 50% of time spent coordinating plan of care           [id number]-of note greater than 40 minutes spent assessing patient-addressing multiple concerns at bedside-and coordinating and formulating a plan of care for numerous diagnoses-of note greater than 50% of time spent coordinating plan of care with extensive patient input

## 2015-02-15 ENCOUNTER — Encounter (HOSPITAL_COMMUNITY)
Admission: AD | Admit: 2015-02-15 | Discharge: 2015-02-15 | Disposition: A | Discharge status: Home / Self Care | Payer: Medicare Other | Source: Skilled Nursing Facility | Attending: Internal Medicine | Admitting: Internal Medicine

## 2015-02-15 ENCOUNTER — Other Ambulatory Visit: Payer: Self-pay | Admitting: *Deleted

## 2015-02-15 DIAGNOSIS — E559 Vitamin D deficiency, unspecified: Secondary | ICD-10-CM | POA: Diagnosis not present

## 2015-02-15 LAB — BASIC METABOLIC PANEL
Anion gap: 8 (ref 5–15)
BUN: 7 mg/dL (ref 6–20)
CHLORIDE: 93 mmol/L — AB (ref 101–111)
CO2: 31 mmol/L (ref 22–32)
Calcium: 8.6 mg/dL — ABNORMAL LOW (ref 8.9–10.3)
Creatinine, Ser: 0.72 mg/dL (ref 0.44–1.00)
GFR calc Af Amer: >60 mL/min (ref >60)
GFR calc non Af Amer: >60 mL/min (ref >60)
GLUCOSE: 103 mg/dL — AB (ref 65–99)
Potassium: 4 mmol/L (ref 3.5–5.1)
SODIUM: 132 mmol/L — AB (ref 135–145)

## 2015-02-15 LAB — CBC WITH DIFFERENTIAL/PLATELET
Basophils Absolute: 0.1 10*3/uL (ref 0.0–0.1)
Basophils Relative: 1 % (ref 0–1)
EOS PCT: 4 % (ref 0–5)
Eosinophils Absolute: 0.3 10*3/uL (ref 0.0–0.7)
HCT: 37.9 % (ref 36.0–46.0)
HEMOGLOBIN: 12.4 g/dL (ref 12.0–15.0)
Lymphocytes Relative: 36 % (ref 12–46)
Lymphs Abs: 3.2 10*3/uL (ref 0.7–4.0)
MCH: 30 pg (ref 26.0–34.0)
MCHC: 32.7 g/dL (ref 30.0–36.0)
MCV: 91.8 fL (ref 78.0–100.0)
Monocytes Absolute: 0.9 10*3/uL (ref 0.1–1.0)
Monocytes Relative: 10 % (ref 3–12)
Neutro Abs: 4.4 10*3/uL (ref 1.7–7.7)
Neutrophils Relative %: 49 % (ref 43–77)
PLATELETS: 371 10*3/uL (ref 150–400)
RBC: 4.13 MIL/uL (ref 3.87–5.11)
RDW: 12.5 % (ref 11.5–15.5)
WBC: 8.9 10*3/uL (ref 4.0–10.5)

## 2015-02-15 MED ORDER — HYDROCODONE-ACETAMINOPHEN 7.5-325 MG PO TABS: ORAL_TABLET | ORAL | Status: DC

## 2015-02-15 NOTE — Telephone Encounter (Signed)
[facility] 

## 2015-03-01 ENCOUNTER — Other Ambulatory Visit: Payer: Self-pay | Admitting: *Deleted

## 2015-03-01 MED ORDER — HYDROCODONE-ACETAMINOPHEN 7.5-325 MG PO TABS: ORAL_TABLET | ORAL | Status: DC

## 2015-03-01 NOTE — Telephone Encounter (Signed)
[place]

## 2015-03-14 ENCOUNTER — Encounter: Payer: Self-pay | Admitting: Internal Medicine

## 2015-03-14 ENCOUNTER — Non-Acute Institutional Stay (SKILLED_NURSING_FACILITY): Payer: Medicare Other | Admitting: Internal Medicine

## 2015-03-14 DIAGNOSIS — G894 Chronic pain syndrome: Secondary | ICD-10-CM

## 2015-03-14 DIAGNOSIS — I639 Cerebral infarction, unspecified: Secondary | ICD-10-CM | POA: Diagnosis not present

## 2015-03-14 DIAGNOSIS — I10 Essential (primary) hypertension: Secondary | ICD-10-CM

## 2015-03-14 DIAGNOSIS — R140 Abdominal distension (gaseous): Secondary | ICD-10-CM | POA: Diagnosis not present

## 2015-03-14 NOTE — Progress Notes (Signed)
Patient ID: Robin Blackburn, female   DOB: 1945/06/29, 69 y.o.   MRN: [id number]              .   . This is a routine  acute visit  ACILITY: [facility]:   SNF   CHIEF COMPLAINT: Medical management of chronic medical conditions including bilateral metatarsal fractures-hypertension-depression-anxiety-chronic pain-rheumatoid arthritis-history CVA-- Acute visit secondary to to feeling like she is bloated has edema      HISTORY OF PRESENT ILLNESS:  This is a patient whom we actually had in the building from September to mid October.  At that point, she had suffered an acute thalamic infarct and, after her arrival here, we discovered her to have a fracture of the right fifth metatarsal for which she was referred to Orthopedics.  She was discharged to her own apartment which apparently has four stairs to enter.         She was discovered during this admission to have a minimally displaced fracture of the left fifth metatarsal and, as noted, has the original fracture that we diagnosed in September --.  These issues have been relatively stable and followed by orthopedics--in fact she is now able to wear tennis shoes this appears to be quite stable which is encouraging    does complain of some chronic pain and muscle discomfort-she is receiving Vicodin-7.5 325 every 4 hours when necessary- She is also on Robaxin  increased this to 7  50 mg every 6 hours when necessary-this appears to be helping some--although she continues to complain of pain at times-   Ishe has seen her rheumatologist  in the past who has prescribed  Plaqueni--however she apparently was refusing this at one point-she has decided to take it on a regular basis-rheumatology was contacted however they would like her to have an eye exam before having it restarted and that exam will be conducted apparently next week    .  She also at times has received azithromycin for suspected  bronchitis- This complicated with a history of recurrent allergic rhinitis  she is not complaining of allergy symptoms today-  Was also noted about 2 months ago she did have a guaiac positive stool  others have been negative-she also has a history of hemorrhoids was thought possibly this may have been a bleeding hemorrhoid which contributed to the one positive reading-her hemoglobin actually stable to somewhat improved-she is on a proton pump inhibitor I did discuss this  and she does not at this point want any GI workup-she feels this was a bleeding hemorrhoid and does not want any workup for now---     Patient does have a history of anxiety she is on Xanax as needed this appears to be relatively stable.  In regards to depression she is on Paxil which appears to be stable as well.  She does have a history of hypertension she is on Microzide recent blood pressures appear to be relatively stable--most recent blood pressure 127/77  1-at this point will monitor since she does not appear to be consistently elevated  She  also complained of some bilateral hearing loss she feels this has been essentially since she had her CVA- We did give her a course of D Brox for some earwax issues-however during loss persisted and she has actually seen an ENT who actually manually disimpacted some earwax she does not complain of hearing difficulties today  Patient's main complaint today other than generalized chronic  pain issues is feeling like she has generalized bloating and edema-her weight appears to be relatively stable she has slowly gained weight about 15 pounds apparently over the past year but there has not been  gainedrapidly-she does not complaining of any increased shortness of breath her edema appears to be fairly minimal and baseline  .    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       Fracture of the right fifth metatarsal in September, now with fracture of the left fifth metatarsal.    Physical deconditioning.    Essential hypertension.    Neuropathy.     Hyponatremia.    Hypokalemia.    History of rheumatoid arthritis, fibromyalgia and reflex sympathetic dystrophy, according to the patient.  Hyperlipidemia.     CURRENT MEDICATIONS:  Medication list is reviewed.            Xanax 1 mg tablet every 6 hours p.r.n.     Plavix 75 q.d.       Bentyl 20 q.6 hours p.r.n., abdominal cramps.    Lomotil 1 tablet two times daily as needed for diarrhea or loose stool.     Microzide 12.5 daily.    Norco 7.5/3.5 q.4 daily.    Prevacid 30 mg b.i.d. daily.    Lidoderm patch to the right knee.    Robaxin 750 mg q.6 p.r.n.     Zofran q.6 p.r.n.     Paxil 10 q.d.    K-dur 20 q.d.    Pravachol 10 q.d.     Restasis ophthalmic 1 drop b.i.d.    Sodium chloride nasal spray 1 spray, each nostril, b.i.d.    Ambien 10 mg q.h.s. P.r.n  Plaqueni -currently is on hold pending eye exam and rheumatology input     I note that aspirin and simvastatin were discontinued.    SOCIAL HISTORY:                HOUSING:  The patient lived in her own apartment.  There is apparently a four-stair entrance stairway that she needs to manage.   FUNCTIONAL STATUS:  She has in-home support which she pays for privately, according to her.    REVIEW OF SYSTEMS: In general does not complain of fever chills. Says she just feels like she is somewhat bloated  Skin is not complaining of rashes or itching.  Head ears eyes nose mouth and throat  Does not complain of headache congestion sore throat nasal drainage- Does not complain of hearing difficulty today       CHEST/RESPIRATORY:  No shortness of breath. Or increased cough- CARDIAC:   No chest pain.     GI:   Is not complaining of nausea vomiting diarrhea constipation or abdominal discomfort today she has had times complain of  some vomiting and diarrhea but that is not the case today  GU does not complain of suprapubic discomfort or dysuria.   MUSCULOSKELETAL:  Somewhat chronic complaints of generalized pain    NeurologicSome previous history of complaints of neuropathic pain does not complain of numbness headache or dizziness currently.--  Psych-does have a history of anxiety this point appears to be relatively stable   increased her Xanax back to 1 mg every 6 hours secondary to increased anxiety symptoms apparently this is helping.     PHYSICAL  EXAMINATION:   Temperature is 98.1 pulse 80 respirations 16 blood pressure 127/77 weight is 215.8 s Gen.  pleasant elderly female in no distress sitting comfortably in her wheelchair   Skin is warm and dry.  she has somewhat thinning hair but this does not appear overtly changed from previous exams   Eyes pupils appear reactive light sclera and conjunctiva are clear visual acuity appears intact.    Oropharynx is clear mucous membranes moist ---  Nose could not really appreciate any active drainage                 CHEST/RESPIRATORY:  Clear air entry bilaterally. No labored breathing   CARDIOVASCULAR:   CARDIAC:   Heart sounds are normal rate and rhythm.  There are no murmurs.  She appears to be euvolemic--I would say trace lower extremity edema.   GASTROINTESTINAL: Abdomen is obese soft does not appear to be overtly tender -bowel sounds are positive--  L  GENITOURINARY:  BLADDER:   I did not note any bladder distention or overt tenderness MUSCULOSKELETAL:   EXTREMITIES:   BILATERAL LOWER EXTREMITIES:    Moves both at baseline strength-she has minimal lower extremity edema  bilaterally  I do not note any deformities of the left foot there is some mild edema of the knees bilaterally    Strength upper extremities bilaterally appears grossly intact again possibly since mild right-sided weakness but this continues to be minimal  t-.  Neuro I could not  really appreciate any overt lateralizing findings she does have a history of CVA her speech is clear cranial nerves appear intact. Does have a history of fairly mild right-sided weakness --minimal deficit here     .    Psych-she is alert and oriented pleasant and appropriate.  Labs  02/15/2015.  Sodium 132 potassium 4 BUN 7 creatinine 0.7 to.  WBC 8.9 hemoglobin 12.4 platelets 371.    02/12/2014-vitamin D level XXIV.5 this appears to be rising  January 18 2015.  Sodium 132 potassium 4.2 BUN 9 creatinine 0.68.  WBC 8.0 hemoglobin 12.2 platelets 380  12/20/2014.  Sodium 131 potassium 4.1 BUN 9 creatinine 0.71.  Calcium 8.8.  12/08/2014.  Vitamin D 22.6.  12/04/2014.  WBC 8.9 hemoglobin 11.1 platelets 377.  W in 3.1 AST 13-ALT 12 otherwise liver function tests within normal limits.  Cholesterol 183-triglycerides 269-HDL 47-LDL 82  11/07/2014.  Sodium 131 potassium 4.3 BUN 10 creatinine 0.77.  WBC 7.6 hemoglobin 11.4 platelets 358.  Sedimentation rate 12.  RA factor 7.0  .  10/09/2014.  WBC 8.4 hemoglobin 11.7 platelets 325.  Sodium 132 potassium 4.4 BUN 9 creatinine 0.78 CO2 32.  09/09/2014.  Liver function tests within normal limits.  07/29/2014-TSH within normal limits at 1.463.  Of note recent vitamin D level on April 14 was low at 17.6  08/21/2014.  Sodium 134 potassium 3.7 BUN 9 creatinine 0.78.  WBC 9.2 hemoglobin 11.2 platelets 386   08/07/2014.  WBC 9.7 hemoglobin 10.8 platelets 392.  Sodium 135 potassium 3.6 BUN 8 creatinine 0.82  06/28/2014.  WBC 8.5 hemoglobin 12.2 platelets 375.  Sodium 135 potassium 4.4 BUN 8 creatinine 0.8  02/20/2014  Magnesium level II.1.  Cholesterol 253 triglycerides 309H LDL 62-LDL 129.  .    .     ASSESSMENT/PLAN:  Complaints of feeling generally bloated and edema-physical exam appear to be fairly baseline she does not complain of any shortness of breath does not appear to have  precipitous weight gain-at this point will  update labs including checking electrolytes as well as a BNP--also will check a TSH although TSH                                                                             History of allergic rhinitis-she continues on Claritin this has been stable recently but appears to have frequent flares  History of guaiac-positive stool-this is a 1 time positive reading-her hemoglobin has been stable she is on a proton pump inhibitor--as noted above-- she does not want a GI workup at this time--will update her CBC    chronic pain muscle spasms -- rheumatoid arthritis-patient is on a significant dose of Vicodin as noted above- We have increased her Robaxin 750 mg every 6 hours previously apparently this is helping some-she would like to restart the Plaqenil--I suspect this will help as well but will need an ophthalmology evaluation exam as noted above before restarting-she will have this exam apparently next week-at this point would be hesitant to increase her narcotic or other medications secondary to sedation concerns    her statin was discontinued secondary to her concerns this may be causing some generalized pain.        In regards hyperlipidemia this appears stable with cholesterol 183 triglycerides 269 HDL 47 LDL of 82 on lab done 12/04/2014 at this point will monitor she prefers to be off the statin     -question increased anxiety Her Xanax recently was increased back to 1 mg every 6 hours when necessary this appears to be helping     .    History of remote left thalamic CVA.  Very little residual of this she continues on Plavix--.        History of metatarsal fractures bilaterally-this is followed by orthopedics--appears to be stable now essentially wearing street shoes which is encouraging.         Hyponatremia and hypokalemia.  The etiology here is unclear. But appears to be stable during her stay here --will update a metabolic  panel    History of rheumatoid arthritis, fibromyalgia and reflex sympathetic dystrophy, according to the patient.  She has chronic pain issues, medication changes as noted above-Plaquenil will be restarted apparentlyiff eye exam is normal                                                                                 Hypertension-at this point appears stable   she is on hydrochlorothiazide-per review I do not see consistent elevations    GERD this appears stable on Prilosec she is no longer complaining symptoms with this will have to be monitored.       .    Depression-at this point is stableon Paxil .                             Insomnia she is on  Ambien this was increased secondary patient requests saying the lower dose of Ambien was not effective she appears to have tolerated this well although will have to be watched--currently on 10 mg a day--      vitamin D deficiency-she has been started on supplementation--and has completed an 8 week course update vitamin D level is pending       [id number]-of note greater than 35 minutes spent assessing patient-addressing her concerns-reviewing her chart-and coordinating and formulating a plan of care for numerous diagnoses-of note greater than 50% of time spent coordinating plan of care

## 2015-03-15 ENCOUNTER — Encounter (HOSPITAL_COMMUNITY)
Admission: AD | Admit: 2015-03-15 | Discharge: 2015-03-15 | Disposition: A | Discharge status: Home / Self Care | Payer: Medicare Other | Source: Skilled Nursing Facility | Attending: Internal Medicine | Admitting: Internal Medicine

## 2015-03-15 DIAGNOSIS — I10 Essential (primary) hypertension: Secondary | ICD-10-CM | POA: Insufficient documentation

## 2015-03-15 LAB — CBC WITH DIFFERENTIAL/PLATELET
Basophils Absolute: 0.1 10*3/uL (ref 0.0–0.1)
Basophils Relative: 1 %
EOS ABS: 0.2 10*3/uL (ref 0.0–0.7)
Eosinophils Relative: 3 %
HEMATOCRIT: 33.8 % — AB (ref 36.0–46.0)
HEMOGLOBIN: 11.5 g/dL — AB (ref 12.0–15.0)
LYMPHS ABS: 2.8 10*3/uL (ref 0.7–4.0)
Lymphocytes Relative: 38 %
MCH: 30.5 pg (ref 26.0–34.0)
MCHC: 34 g/dL (ref 30.0–36.0)
MCV: 89.7 fL (ref 78.0–100.0)
MONOS PCT: 12 %
Monocytes Absolute: 0.9 10*3/uL (ref 0.1–1.0)
NEUTROS ABS: 3.6 10*3/uL (ref 1.7–7.7)
Neutrophils Relative %: 46 %
Platelets: 347 10*3/uL (ref 150–400)
RBC: 3.77 MIL/uL — ABNORMAL LOW (ref 3.87–5.11)
RDW: 12.7 % (ref 11.5–15.5)
WBC: 7.6 10*3/uL (ref 4.0–10.5)

## 2015-03-15 LAB — COMPREHENSIVE METABOLIC PANEL
ALBUMIN: 3.4 g/dL — AB (ref 3.5–5.0)
ALT: 13 U/L — AB (ref 14–54)
AST: 19 U/L (ref 15–41)
Alkaline Phosphatase: 53 U/L (ref 38–126)
Anion gap: 10 (ref 5–15)
BUN: 8 mg/dL (ref 6–20)
CO2: 29 mmol/L (ref 22–32)
CREATININE: 0.75 mg/dL (ref 0.44–1.00)
Calcium: 8.3 mg/dL — ABNORMAL LOW (ref 8.9–10.3)
Chloride: 91 mmol/L — ABNORMAL LOW (ref 101–111)
GFR calc Af Amer: >60 mL/min (ref >60)
GFR calc non Af Amer: >60 mL/min (ref >60)
GLUCOSE: 101 mg/dL — AB (ref 65–99)
Potassium: 4 mmol/L (ref 3.5–5.1)
SODIUM: 130 mmol/L — AB (ref 135–145)
Total Bilirubin: 0.5 mg/dL (ref 0.3–1.2)
Total Protein: 6.5 g/dL (ref 6.5–8.1)

## 2015-03-15 LAB — TSH: TSH: 1.498 u[IU]/mL (ref 0.350–4.500)

## 2015-03-15 LAB — BRAIN NATRIURETIC PEPTIDE: B Natriuretic Peptide: 47 pg/mL (ref 0.0–100.0)

## 2015-03-18 LAB — 25-HYDROXY VITAMIN D LCMS D2+D3
25-Hydroxy, Vitamin D-2: 1.8 ng/mL
25-Hydroxy, Vitamin D-3: 31 ng/mL
25-Hydroxy, Vitamin D: 33 ng/mL

## 2015-03-21 ENCOUNTER — Encounter (HOSPITAL_COMMUNITY)
Admission: RE | Admit: 2015-03-21 | Discharge: 2015-03-21 | Disposition: A | Discharge status: Home / Self Care | Payer: Medicare Other | Source: Skilled Nursing Facility | Attending: Internal Medicine | Admitting: Internal Medicine

## 2015-03-21 DIAGNOSIS — I10 Essential (primary) hypertension: Secondary | ICD-10-CM | POA: Diagnosis not present

## 2015-03-21 LAB — BASIC METABOLIC PANEL
ANION GAP: 7 (ref 5–15)
BUN: 10 mg/dL (ref 6–20)
CALCIUM: 8.9 mg/dL (ref 8.9–10.3)
CO2: 29 mmol/L (ref 22–32)
CREATININE: 0.78 mg/dL (ref 0.44–1.00)
Chloride: 93 mmol/L — ABNORMAL LOW (ref 101–111)
Glucose, Bld: 112 mg/dL — ABNORMAL HIGH (ref 65–99)
Potassium: 3.8 mmol/L (ref 3.5–5.1)
SODIUM: 129 mmol/L — AB (ref 135–145)

## 2015-03-22 ENCOUNTER — Other Ambulatory Visit (HOSPITAL_COMMUNITY)
Admission: AD | Admit: 2015-03-22 | Discharge: 2015-03-22 | Disposition: A | Discharge status: Home / Self Care | Payer: Medicare Other | Source: Skilled Nursing Facility | Attending: Internal Medicine | Admitting: Internal Medicine

## 2015-03-22 DIAGNOSIS — I10 Essential (primary) hypertension: Secondary | ICD-10-CM | POA: Diagnosis present

## 2015-03-22 LAB — SODIUM, URINE, RANDOM: SODIUM UR: 54 mmol/L

## 2015-03-23 ENCOUNTER — Encounter (HOSPITAL_COMMUNITY)
Admission: AD | Admit: 2015-03-23 | Discharge: 2015-03-23 | Disposition: A | Discharge status: Home / Self Care | Payer: Medicare Other | Source: Skilled Nursing Facility | Attending: Internal Medicine | Admitting: Internal Medicine

## 2015-03-23 DIAGNOSIS — I10 Essential (primary) hypertension: Secondary | ICD-10-CM | POA: Diagnosis not present

## 2015-03-23 LAB — BASIC METABOLIC PANEL
Anion gap: 9 (ref 5–15)
BUN: 11 mg/dL (ref 6–20)
CALCIUM: 8.9 mg/dL (ref 8.9–10.3)
CO2: 30 mmol/L (ref 22–32)
Chloride: 93 mmol/L — ABNORMAL LOW (ref 101–111)
Creatinine, Ser: 0.82 mg/dL (ref 0.44–1.00)
GFR calc Af Amer: >60 mL/min (ref >60)
GFR calc non Af Amer: >60 mL/min (ref >60)
GLUCOSE: 107 mg/dL — AB (ref 65–99)
Potassium: 4.1 mmol/L (ref 3.5–5.1)
Sodium: 132 mmol/L — ABNORMAL LOW (ref 135–145)

## 2015-03-23 LAB — OSMOLALITY: Osmolality: 277 mOsm/kg (ref 275–300)

## 2015-03-30 ENCOUNTER — Non-Acute Institutional Stay (SKILLED_NURSING_FACILITY): Payer: Medicare Other | Admitting: Internal Medicine

## 2015-03-30 DIAGNOSIS — M25562 Pain in left knee: Secondary | ICD-10-CM | POA: Diagnosis not present

## 2015-03-30 DIAGNOSIS — G894 Chronic pain syndrome: Secondary | ICD-10-CM | POA: Diagnosis not present

## 2015-03-30 DIAGNOSIS — K1379 Other lesions of oral mucosa: Secondary | ICD-10-CM

## 2015-03-30 NOTE — Progress Notes (Signed)
Patient ID: Robin Blackburn, female   DOB: 02/12/1946, 69 y.o.   MRN: [id number]               .   . This is a routine  acute visit  ACILITY: [facility]:   SNF   CHIEF COMPLAINT:  Acute visit secondary to low left leg pain--mouth discomfort      HISTORY OF PRESENT ILLNESS:  This is a patient whom we actually had in the building from September to mid October.  At that point, she had suffered an acute thalamic infarct and, after her arrival here, we discovered her to have a fracture of the right fifth metatarsal for which she was referred to Orthopedics.  She was discharged to her own apartment which apparently has four stairs to enter.         She was discovered during this admission to have a minimally displaced fracture of the left fifth metatarsal and, as noted, has the original fracture that we diagnosed in September --.  These issues have been relatively stable and followed by orthopedics--in fact she is now able to wear tennis shoes this appears to be quite stable which is encouraging    does complain of some chronic pain and muscle discomfort-she is receiving Vicodin-7.5 325 every 4 hours when necessary- She is also on Robaxin  increased this to 7  50 mg every 6 hours when necessary-this appears to be helping some--although she continues to complain of pain at times-   Ishe has seen her rheumatologist  in the past who has prescribed  Plaqueni--however she apparently was refusing this at one point-she has decided to take it on a regular basis-rheumatology was contacted however they wanted her  to have an eye exam before having itrestarted--and she is now taking this.  Today she is complaining of some increased pain in her left lower leg she denies any, I do note back in September she had an x-ray of her left knee which showed mild degenerative changes with a small suprapubic patellar effusion.   back to May x-ray showed complete healing of  the fracture of her fifth metatarsal  She is also complaining of some mouth pain this is an intermittent problem apparently she would like a mouthwash to help-she does not really complain of any difficulty swelling or lip swelling or tongue swelling    .      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      Fracture of the right fifth metatarsal in September,  fracture of the left fifth metatarsal-- an x-ray did show complete healing of this.    Physical deconditioning.    Essential hypertension.    Neuropathy.     Hyponatremia.    Hypokalemia.    History of rheumatoid arthritis, fibromyalgia and reflex sympathetic dystrophy, according to the patient.  Hyperlipidemia.     CURRENT MEDICATIONS:  Medication list is reviewed.            Xanax 1 mg tablet every 6 hours p.r.n.     Plavix 75 q.d.       Bentyl 20 q.6 hours p.r.n., abdominal cramps.    Lomotil 1 tablet two times daily as needed for diarrhea or loose stool.     Microzide 12.5 daily.    Norco 7.5/3.5 q.4 daily.    Prevacid 30 mg  b.i.d. daily.    Lidoderm patch to the right knee.    Robaxin 750 mg q.6 p.r.n.     Zofran q.6 p.r.n.     Paxil 10 q.d.    K-dur 20 q.d.    Pravachol 10 q.d.     Restasis ophthalmic 1 drop b.i.d.    Sodium chloride nasal spray 1 spray, each nostril, b.i.d.    Ambien 10 mg q.h.s. P.r.n  Plaqueni --has just been restarted     I note that aspirin and simvastatin were discontinued.    SOCIAL HISTORY:                HOUSING:  The patient lived in her own apartment.  There is apparently a four-stair entrance stairway that she needs to manage.   FUNCTIONAL STATUS:  She has in-home support which she pays for privately, according to her.    REVIEW OF SYSTEMS: In general does not complain of fever chills.   Skin is not complaining of rashes or itching.  Head ears eyes nose mouth and throat  Does  not complain of headache congestion sore throat nasal drainage- Does not complain of hearing difficulty today  Mouth-as noted above is complaining of some mouth discomfort       CHEST/RESPIRATORY:  No shortness of breath. Or increased cough- CARDIAC:   No chest pain.     GI:   Is not complaining of nausea vomiting diarrhea constipation or abdominal discomfort today she has had times complain of some vomiting and diarrhea but that is not the case today  GU does not complain of suprapubic discomfort or dysuria.   MUSCULOSKELETAL:  Somewhat chronic complaints of generalized pain --more so of her left lower leg today    NeurologicSome previous history of complaints of neuropathic pain does not complain of numbness headache or dizziness currently.--  Psych-does have a history of anxiety this point appears to be relatively stable   increased her Xanax back to 1 mg every 6 hours secondary to increased anxiety symptoms apparently this is helping.     PHYSICAL EXAMINATION:    Temperature 97.9 pulse 77 respirations 18 blood pressure 129/61  s Gen.  pleasant elderly female in no distress sitting comfortably in her wheelchair   Skin is warm and dry.  she has somewhat thinning hair but this does not appear overtly changed from previous exams   Eyes pupils appear reactive light sclera and conjunctiva are clear visual acuity appears intact.    Oropharynx is clear mucous membranes moist --- tongue is midline I do not note any edema  Do not really appreciate any overt  abnormalities   Nose could not really appreciate any active drainage                 CHEST/RESPIRATORY:  Clear air entry bilaterally. No labored breathing   CARDIOVASCULAR:   CARDIAC:   Heart sounds are normal rate and rhythm.  There are no murmurs.  She appears to be euvolemic--I would say trace lower extremity edema.   GASTROINTESTINAL: Abdomen is obese soft does not appear to be overtly tender -bowel sounds are  positive--  L    MUSCULOSKELETAL:   EXTREMITIES:   BILATERAL LOWER EXTREMITIES:    Moves both at baseline strength-she has minimal lower extremity edema  bilaterally  I do not note any deformities of the left foot there is some mild edema of the knees bilaterally  I do not otherwise note any deformity of the left knee or  foot.  There is a positive pedal pulses there is some tenderness to palpation on the bottom of her left foot I did not note any erythema or acute tenderness to palpation here    Strength upper extremities bilaterally appears grossly intact again possibly since mild right-sided weakness but this continues to be minimal  t-.  Neuro I could not really appreciate any overt lateralizing findings she does have a history of CVA her speech is clear cranial nerves appear intact. Does have a history of fairly mild right-sided weakness --minimal deficit here     .    Psych-she is alert and oriented pleasant and appropriate.  Labs  03/23/2015.  Sodium 132 potassium 4.1 BUN 11 creatinine 0.82.  03/15/2015.  WBC 7.0 hemoglobin 11.4 platelets 347.    02/15/2015.  Sodium 132 potassium 4 BUN 7 creatinine 0.7 to.  WBC 8.9 hemoglobin 12.4 platelets 371.    02/12/2014-vitamin D level XXIV.5 this appears to be rising  January 18 2015.  Sodium 132 potassium 4.2 BUN 9 creatinine 0.68.  WBC 8.0 hemoglobin 12.2 platelets 380  12/20/2014.  Sodium 131 potassium 4.1 BUN 9 creatinine 0.71.  Calcium 8.8.  12/08/2014.  Vitamin D 22.6.  12/04/2014.  WBC 8.9 hemoglobin 11.1 platelets 377.  W in 3.1 AST 13-ALT 12 otherwise liver function tests within normal limits.  Cholesterol 183-triglycerides 269-HDL 47-LDL 82  11/07/2014.  Sodium 131 potassium 4.3 BUN 10 creatinine 0.77.  WBC 7.6 hemoglobin 11.4 platelets 358.  Sedimentation rate 12.  RA factor 7.0  .  10/09/2014.  WBC 8.4 hemoglobin 11.7 platelets 325.  Sodium 132 potassium 4.4 BUN 9  creatinine 0.78 CO2 32.  09/09/2014.  Liver function tests within normal limits.  07/29/2014-TSH within normal limits at 1.463.  Of note recent vitamin D level on April 14 was low at 17.6  08/21/2014.  Sodium 134 potassium 3.7 BUN 9 creatinine 0.78.  WBC 9.2 hemoglobin 11.2 platelets 386   08/07/2014.  WBC 9.7 hemoglobin 10.8 platelets 392.  Sodium 135 potassium 3.6 BUN 8 creatinine 0.82  06/28/2014.  WBC 8.5 hemoglobin 12.2 platelets 375.  Sodium 135 potassium 4.4 BUN 8 creatinine 0.8  02/20/2014  Magnesium level II.1.  Cholesterol 253 triglycerides 309H LDL 62-LDL 129.  .    .     ASSESSMENT/PLAN  History of lower left leg pain-I could not really appreciate any abnormalities other than arthritic changes on exam previous x-ray did not really show any acute process did show degenerative changes however she says the pain has increased we will re-x-ray the area from the knee down to the foot-she is receiving Vicodin 7.5-325 mg every 4 hours when necessary pain she is also receiving Robaxin every 6 hours when necessary --I would be hesitant to increase her pain medication secondary to concerns of sedation and patient did express understanding  She also has just been restarted on plaquenil  would like to give this a bit more time to work and she is followed by rheumatology in this regards   Mouth discomfort-will prescribe Magic mouthwash 5 mL 4 times a day for 5 days and monitor this as well.  [id number]

## 2015-03-31 ENCOUNTER — Inpatient Hospital Stay (HOSPITAL_COMMUNITY): Payer: Medicare Other | Attending: Internal Medicine

## 2015-04-05 ENCOUNTER — Encounter: Payer: Self-pay | Admitting: Internal Medicine

## 2015-04-05 ENCOUNTER — Non-Acute Institutional Stay (SKILLED_NURSING_FACILITY): Payer: Medicare Other | Admitting: Internal Medicine

## 2015-04-05 DIAGNOSIS — J309 Allergic rhinitis, unspecified: Secondary | ICD-10-CM | POA: Diagnosis not present

## 2015-04-05 DIAGNOSIS — M25572 Pain in left ankle and joints of left foot: Secondary | ICD-10-CM | POA: Diagnosis not present

## 2015-04-05 DIAGNOSIS — K219 Gastro-esophageal reflux disease without esophagitis: Secondary | ICD-10-CM | POA: Diagnosis not present

## 2015-04-05 NOTE — Progress Notes (Signed)
Patient ID: Robin Blackburn, female   DOB: [birth year], 69 y.o.   MRN: [id number]                .   . This is an  acute visit  ACILITY: [facility]:   SNF   CHIEF COMPLAINT:  Acute visit secondary to multiple issues including nasal drainage--Gerd. Symptoms follow-up left foot x-ray      HISTORY OF PRESENT ILLNESS:  This is a patient whom we actually had in the building from September to mid October.  At that point, she had suffered an acute thalamic infarct and, after her arrival here, we discovered her to have a fracture of the right fifth metatarsal for which she was referred to Orthopedics.  She was discharged to her own apartment which apparently has four stairs to enter.         She was discovered duringmost recent admission to have a minimally displaced fracture of the left fifth metatarsal and, as noted, has the original fracture that we diagnosed in September --she recently complain of some increased foot pain however x-ray has not shown any acute changes. Did show soft tissue swelling left ankle foot area but no acute process.  These issues have been relatively stable and followed by orthopedics--in fact she is now able to wear tennis shoes this appears to be quite stable which is encouraging    She also has a history of GERD-like symptoms-she says this has increased recently and would like her Prevacid increased we will do this.  Another issue is nasal drainage she does at times have allergic rhinitis symptoms she is on Clarinex currently-she does not complain of any increased shortness of breath beyond baseline or increased significantly cough.  She also has a history of hyponatremia which is chronic-most recent sodium was 132 on October 14 we will update this appears her baseline sodium runs largely around 1:30   Ishe has seen her rheumatologist  in the past who has prescribed  Plaqueni--however she apparently was refusing this at one  point-she has decided to take it on a regular basis-rheumatology was contacted however they wanted her  to have an eye exam before having itrestarted--and she is now taking this.      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      Fracture of the right fifth metatarsal in September,  fracture of the left fifth metatarsal-- an x-ray did show complete healing of this.    Physical deconditioning.    Essential hypertension.    Neuropathy.     Hyponatremia.    Hypokalemia.    History of rheumatoid arthritis, fibromyalgia and reflex sympathetic dystrophy, according to the patient.  Hyperlipidemia.     CURRENT MEDICATIONS:  Medication list is reviewed.            Xanax 1 mg tablet every 6 hours p.r.n.     Plavix 75 q.d.       Bentyl 20 q.6 hours p.r.n., abdominal cramps.    Lomotil 1 tablet two times daily as needed for diarrhea or loose stool.     Microzide 12.5 daily.    Norco 7.5/3.5 q.4 daily.    Prevacid 30 mg . daily.    Lidoderm patch to the right knee.    Robaxin 750 mg q.6 p.r.n.  Zofran q.6 p.r.n.     Paxil 10 q.d.    K-dur 20 q.d.    Pravachol 10 q.d.     Restasis ophthalmic 1 drop b.i.d.    Sodium chloride nasal spray 1 spray, each nostril, b.i.d.    Ambien 10 mg q.h.s. P.r.n  Plaqueni --has just been restarted     I note that aspirin and simvastatin were discontinued.    SOCIAL HISTORY:                HOUSING:  The patient lived in her own apartment.  There is apparently a four-stair entrance stairway that she needs to manage.   FUNCTIONAL STATUS:  She has in-home support which she pays for privately, according to her.    REVIEW OF SYSTEMS: In general does not complain of fever chills.   Skin is not complaining of rashes or itching.  Head ears eyes nose mouth and throat  Complaining of nasal drainage-she does have a history of allergic  rhinitis Does not complain of hearing difficulty today  Mouth-as noted above is complaining of some mouth discomfort       CHEST/RESPIRATORY:  No shortness of breath. Or increased cough- CARDIAC:   No chest pain.     GI:   Is not complaining of nausea vomiting diarrhea constipation or abdominal discomfort today she has had times complain of some vomiting and diarrhea but that is not the case today  GU does not complain of suprapubic discomfort or dysuria.   MUSCULOSKELETAL:  Somewhat chronic complaints of generalized pain --more so of her left lower leg as noted above x-rays were fairly benign   NeurologicSome previous history of complaints of neuropathic pain does not complain of numbness headache or dizziness currently.--  Psych-does have a history of anxiety this point appears to be relatively stable   increased her Xanax back to 1 mg every 6 hours secondary to increased anxiety symptoms apparently this is helping.     PHYSICAL EXAMINATION:  She is afebrile pulse 77 respirations 18 blood pressure 119/61  s Gen.  pleasant elderly female in no distress sitting comfortably in her wheelchair   Skin is warm and dry.  she has somewhat thinning hair but this does not appear overtly changed from previous exams   Eyes pupils appear reactive light sclera and conjunctiva are clear visual acuity appears intact.    Oropharynx is clear mucous membranes moist --- tongue is midline I do not note any edema  Do not really appreciate any overt  Abnormalities--oropharynx is clear   Nose -fairly minimal clear drainage                 CHEST/RESPIRATORY:  Clear air entry bilaterally. No labored breathing   CARDIOVASCULAR:   CARDIAC:   Heart sounds are normal rate and rhythm.  There are no murmurs.  She appears to be euvolemic--I would say trace lower extremity edema.   GASTROINTESTINAL: Abdomen is obese soft does not appear to be overtly tender -bowel sounds are positive--  L     MUSCULOSKELETAL:   EXTREMITIES:   BILATERAL LOWER EXTREMITIES:    Moves both at baseline strength-she has minimal lower extremity edema  bilaterally  I do not note any deformities of the left foot there is some mild edema of the knees bilaterally  I do not otherwise note any deformity of the left knee or foot.   I do not note any erythema or warmth of the left ankle foot area there is  some mild edema    Strength upper extremities bilaterally appears grossly intact again possibly since mild right-sided weakness but this continues to be minimal  t-.  Neuro I could not really appreciate any overt lateralizing findings she does have a history of CVA her speech is clear cranial nerves appear intact. Does have a history of fairly mild right-sided weakness --minimal deficit here     .    Psych-she is alert and oriented pleasant and appropriate.  Labs  03/23/2015.  Sodium 132 potassium 4.1 BUN 11 creatinine 0.82.  03/15/2015.  WBC 7.0 hemoglobin 11.4 platelets 347.    02/15/2015.  Sodium 132 potassium 4 BUN 7 creatinine 0.7 to.  WBC 8.9 hemoglobin 12.4 platelets 371.    02/12/2014-vitamin D level XXIV.5 this appears to be rising  January 18 2015.  Sodium 132 potassium 4.2 BUN 9 creatinine 0.68.  WBC 8.0 hemoglobin 12.2 platelets 380  12/20/2014.  Sodium 131 potassium 4.1 BUN 9 creatinine 0.71.  Calcium 8.8.  12/08/2014.  Vitamin D 22.6.  12/04/2014.  WBC 8.9 hemoglobin 11.1 platelets 377.  W in 3.1 AST 13-ALT 12 otherwise liver function tests within normal limits.  Cholesterol 183-triglycerides 269-HDL 47-LDL 82  11/07/2014.  Sodium 131 potassium 4.3 BUN 10 creatinine 0.77.  WBC 7.6 hemoglobin 11.4 platelets 358.  Sedimentation rate 12.  RA factor 7.0        ASSESSMENT/PLAN  History of lower left leg pain This appears to be somewhat better today-again x-rays did not really show any acute process t-she is receiving Vicodin 7.5-325 mg  every 4 hours when necessary pain she is also receiving Robaxin every 6 hours when necessary --I would be hesitant to increase her pain medication secondary to concerns of sedation and patient did express understanding  She also has just been restarted on plaquenil  --she has a history of rheumatoid arthritis     Nasal drainage-she is on Clarinex will add a short course of Flonase 1 spray twice a day did see if this will give added relief-she has been afebrile respiratory wise appears to be stable.  #3 history of GERD she is now on a proton pump inhibitor twice a day at this point will monitor.  #4-history hyponatremia this is chronic this appears relatively baseline will update a BMP next week.  [id number] of note greater than 25 minutes spent assessing patient-addressing her numerous concerns at bedside-and coordinating formulating a plan of care-of note greater than 50% of time spent coordinating plan of care with patient in put

## 2015-04-06 ENCOUNTER — Encounter (HOSPITAL_COMMUNITY)
Admission: AD | Admit: 2015-04-06 | Discharge: 2015-04-06 | Disposition: A | Discharge status: Home / Self Care | Payer: Medicare Other | Source: Skilled Nursing Facility | Attending: Internal Medicine | Admitting: Internal Medicine

## 2015-04-06 DIAGNOSIS — I10 Essential (primary) hypertension: Secondary | ICD-10-CM | POA: Diagnosis not present

## 2015-04-06 LAB — BASIC METABOLIC PANEL
Anion gap: 8 (ref 5–15)
BUN: 9 mg/dL (ref 6–20)
CHLORIDE: 94 mmol/L — AB (ref 101–111)
CO2: 31 mmol/L (ref 22–32)
CREATININE: 0.72 mg/dL (ref 0.44–1.00)
Calcium: 8.8 mg/dL — ABNORMAL LOW (ref 8.9–10.3)
GFR calc Af Amer: >60 mL/min (ref >60)
GFR calc non Af Amer: >60 mL/min (ref >60)
Glucose, Bld: 102 mg/dL — ABNORMAL HIGH (ref 65–99)
Potassium: 4.1 mmol/L (ref 3.5–5.1)
SODIUM: 133 mmol/L — AB (ref 135–145)

## 2015-04-06 LAB — CBC WITH DIFFERENTIAL/PLATELET
Basophils Absolute: 0.1 10*3/uL (ref 0.0–0.1)
Basophils Relative: 1 %
EOS ABS: 0.2 10*3/uL (ref 0.0–0.7)
EOS PCT: 3 %
HCT: 35.1 % — ABNORMAL LOW (ref 36.0–46.0)
Hemoglobin: 11.5 g/dL — ABNORMAL LOW (ref 12.0–15.0)
LYMPHS ABS: 2.6 10*3/uL (ref 0.7–4.0)
Lymphocytes Relative: 34 %
MCH: 29.9 pg (ref 26.0–34.0)
MCHC: 32.8 g/dL (ref 30.0–36.0)
MCV: 91.4 fL (ref 78.0–100.0)
Monocytes Absolute: 0.8 10*3/uL (ref 0.1–1.0)
Monocytes Relative: 11 %
Neutro Abs: 3.8 10*3/uL (ref 1.7–7.7)
Neutrophils Relative %: 51 %
PLATELETS: 396 10*3/uL (ref 150–400)
RBC: 3.84 MIL/uL — ABNORMAL LOW (ref 3.87–5.11)
RDW: 12.8 % (ref 11.5–15.5)
WBC: 7.4 10*3/uL (ref 4.0–10.5)

## 2015-04-08 ENCOUNTER — Encounter: Payer: Self-pay | Admitting: Internal Medicine

## 2015-04-08 DIAGNOSIS — M25569 Pain in unspecified knee: Secondary | ICD-10-CM | POA: Insufficient documentation

## 2015-04-08 DIAGNOSIS — K1379 Other lesions of oral mucosa: Secondary | ICD-10-CM | POA: Insufficient documentation

## 2015-04-09 ENCOUNTER — Encounter (HOSPITAL_COMMUNITY)
Admission: RE | Admit: 2015-04-09 | Discharge: 2015-04-09 | Disposition: A | Discharge status: Home / Self Care | Payer: Medicare Other | Source: Skilled Nursing Facility | Attending: Internal Medicine | Admitting: Internal Medicine

## 2015-04-09 DIAGNOSIS — I10 Essential (primary) hypertension: Secondary | ICD-10-CM | POA: Diagnosis not present

## 2015-04-09 LAB — BASIC METABOLIC PANEL
ANION GAP: 5 (ref 5–15)
BUN: 8 mg/dL (ref 6–20)
CALCIUM: 8.4 mg/dL — AB (ref 8.9–10.3)
CO2: 31 mmol/L (ref 22–32)
Chloride: 91 mmol/L — ABNORMAL LOW (ref 101–111)
Creatinine, Ser: 0.72 mg/dL (ref 0.44–1.00)
GFR calc non Af Amer: >60 mL/min (ref >60)
Glucose, Bld: 108 mg/dL — ABNORMAL HIGH (ref 65–99)
Potassium: 4.1 mmol/L (ref 3.5–5.1)
Sodium: 127 mmol/L — ABNORMAL LOW (ref 135–145)

## 2015-04-10 ENCOUNTER — Emergency Department (HOSPITAL_COMMUNITY)
Admission: EM | Admit: 2015-04-10 | Discharge: 2015-04-11 | Disposition: A | Discharge status: Home / Self Care | Payer: Medicare Other | Attending: Emergency Medicine | Admitting: Emergency Medicine

## 2015-04-10 ENCOUNTER — Non-Acute Institutional Stay (SKILLED_NURSING_FACILITY): Payer: Medicare Other | Admitting: Internal Medicine

## 2015-04-10 ENCOUNTER — Encounter (HOSPITAL_COMMUNITY): Payer: Self-pay | Admitting: *Deleted

## 2015-04-10 DIAGNOSIS — R600 Localized edema: Secondary | ICD-10-CM | POA: Diagnosis not present

## 2015-04-10 DIAGNOSIS — Z79899 Other long term (current) drug therapy: Secondary | ICD-10-CM | POA: Diagnosis not present

## 2015-04-10 DIAGNOSIS — M797 Fibromyalgia: Secondary | ICD-10-CM | POA: Insufficient documentation

## 2015-04-10 DIAGNOSIS — F329 Major depressive disorder, single episode, unspecified: Secondary | ICD-10-CM | POA: Diagnosis not present

## 2015-04-10 DIAGNOSIS — Z7951 Long term (current) use of inhaled steroids: Secondary | ICD-10-CM | POA: Insufficient documentation

## 2015-04-10 DIAGNOSIS — Z880 Allergy status to penicillin: Secondary | ICD-10-CM | POA: Diagnosis not present

## 2015-04-10 DIAGNOSIS — Z791 Long term (current) use of non-steroidal anti-inflammatories (NSAID): Secondary | ICD-10-CM | POA: Diagnosis not present

## 2015-04-10 DIAGNOSIS — M069 Rheumatoid arthritis, unspecified: Secondary | ICD-10-CM | POA: Diagnosis not present

## 2015-04-10 DIAGNOSIS — F419 Anxiety disorder, unspecified: Secondary | ICD-10-CM | POA: Diagnosis not present

## 2015-04-10 DIAGNOSIS — L03114 Cellulitis of left upper limb: Secondary | ICD-10-CM | POA: Insufficient documentation

## 2015-04-10 DIAGNOSIS — Z8669 Personal history of other diseases of the nervous system and sense organs: Secondary | ICD-10-CM | POA: Insufficient documentation

## 2015-04-10 DIAGNOSIS — K219 Gastro-esophageal reflux disease without esophagitis: Secondary | ICD-10-CM | POA: Diagnosis not present

## 2015-04-10 DIAGNOSIS — I10 Essential (primary) hypertension: Secondary | ICD-10-CM | POA: Diagnosis not present

## 2015-04-10 DIAGNOSIS — R110 Nausea: Secondary | ICD-10-CM | POA: Diagnosis not present

## 2015-04-10 DIAGNOSIS — L0390 Cellulitis, unspecified: Secondary | ICD-10-CM

## 2015-04-10 DIAGNOSIS — R21 Rash and other nonspecific skin eruption: Secondary | ICD-10-CM | POA: Diagnosis present

## 2015-04-10 DIAGNOSIS — Z8673 Personal history of transient ischemic attack (TIA), and cerebral infarction without residual deficits: Secondary | ICD-10-CM | POA: Insufficient documentation

## 2015-04-10 LAB — URINE MICROSCOPIC-ADD ON

## 2015-04-10 LAB — COMPREHENSIVE METABOLIC PANEL
ALBUMIN: 3.7 g/dL (ref 3.5–5.0)
ALT: 11 U/L — ABNORMAL LOW (ref 14–54)
ANION GAP: 11 (ref 5–15)
AST: 20 U/L (ref 15–41)
Alkaline Phosphatase: 74 U/L (ref 38–126)
BUN: 9 mg/dL (ref 6–20)
CHLORIDE: 91 mmol/L — AB (ref 101–111)
CO2: 27 mmol/L (ref 22–32)
Calcium: 8.8 mg/dL — ABNORMAL LOW (ref 8.9–10.3)
Creatinine, Ser: 0.85 mg/dL (ref 0.44–1.00)
GFR calc non Af Amer: >60 mL/min (ref >60)
Glucose, Bld: 111 mg/dL — ABNORMAL HIGH (ref 65–99)
Potassium: 3.7 mmol/L (ref 3.5–5.1)
SODIUM: 129 mmol/L — AB (ref 135–145)
Total Bilirubin: 0.7 mg/dL (ref 0.3–1.2)
Total Protein: 7.2 g/dL (ref 6.5–8.1)

## 2015-04-10 LAB — URINALYSIS, ROUTINE W REFLEX MICROSCOPIC
Bilirubin Urine: NEGATIVE
Glucose, UA: NEGATIVE mg/dL
Ketones, ur: NEGATIVE mg/dL
Leukocytes, UA: NEGATIVE
NITRITE: NEGATIVE
Protein, ur: NEGATIVE mg/dL
SPECIFIC GRAVITY, URINE: 1.015 (ref 1.005–1.030)
UROBILINOGEN UA: 0.2 mg/dL (ref 0.0–1.0)
pH: 7 (ref 5.0–8.0)

## 2015-04-10 LAB — CBC WITH DIFFERENTIAL/PLATELET
BASOS ABS: 0 10*3/uL (ref 0.0–0.1)
BASOS PCT: 0 %
EOS ABS: 0.1 10*3/uL (ref 0.0–0.7)
Eosinophils Relative: 1 %
HEMATOCRIT: 35.7 % — AB (ref 36.0–46.0)
Hemoglobin: 11.6 g/dL — ABNORMAL LOW (ref 12.0–15.0)
Lymphocytes Relative: 21 %
Lymphs Abs: 3 10*3/uL (ref 0.7–4.0)
MCH: 29.4 pg (ref 26.0–34.0)
MCHC: 32.5 g/dL (ref 30.0–36.0)
MCV: 90.6 fL (ref 78.0–100.0)
Monocytes Absolute: 1.5 10*3/uL — ABNORMAL HIGH (ref 0.1–1.0)
Monocytes Relative: 11 %
NEUTROS ABS: 9.4 10*3/uL — AB (ref 1.7–7.7)
NEUTROS PCT: 67 %
Platelets: 398 10*3/uL (ref 150–400)
RBC: 3.94 MIL/uL (ref 3.87–5.11)
RDW: 12.7 % (ref 11.5–15.5)
WBC: 14 10*3/uL — ABNORMAL HIGH (ref 4.0–10.5)

## 2015-04-10 MED ORDER — SODIUM CHLORIDE 0.9 % IV BOLUS (SEPSIS)
1000.0000 mL | Freq: Once | INTRAVENOUS | Status: AC
  Administered 2015-04-10: 1000 mL via INTRAVENOUS

## 2015-04-10 MED ORDER — DOXYCYCLINE HYCLATE 100 MG IV SOLR
100.0000 mg | Freq: Once | INTRAVENOUS | Status: AC
  Administered 2015-04-10: 100 mg via INTRAVENOUS
  Filled 2015-04-10: qty 100

## 2015-04-10 MED ORDER — DOXYCYCLINE HYCLATE 100 MG IV SOLR
INTRAVENOUS | Status: AC
  Filled 2015-04-10: qty 100

## 2015-04-10 MED ORDER — ONDANSETRON 4 MG PO TBDP: ORAL_TABLET | ORAL | Status: DC

## 2015-04-10 MED ORDER — ONDANSETRON HCL 4 MG/2ML IJ SOLN
4.0000 mg | Freq: Once | INTRAMUSCULAR | Status: AC
  Administered 2015-04-10: 4 mg via INTRAVENOUS
  Filled 2015-04-10: qty 2

## 2015-04-10 MED ORDER — HYDROCODONE-ACETAMINOPHEN 5-325 MG PO TABS
1.0000 | ORAL_TABLET | Freq: Once | ORAL | Status: AC
  Administered 2015-04-10: 1 via ORAL
  Filled 2015-04-10: qty 1

## 2015-04-10 MED ORDER — DOXYCYCLINE HYCLATE 100 MG PO CAPS: 100.0000 mg | ORAL_CAPSULE | Freq: Two times a day (BID) | ORAL | Status: DC

## 2015-04-10 NOTE — ED Notes (Signed)
Pt removed from bedpan and bed linens changed

## 2015-04-10 NOTE — ED Notes (Signed)
Pt is a resident of [facility], received a pneumonia shot in left arm yesterday, started having swelling, redness noted to left upper arm since receiving the pneumonia shot, pt also c/o increase in urinary frequency,

## 2015-04-11 ENCOUNTER — Non-Acute Institutional Stay (SKILLED_NURSING_FACILITY): Payer: Medicare Other | Admitting: Internal Medicine

## 2015-04-11 ENCOUNTER — Other Ambulatory Visit: Payer: Self-pay | Admitting: Internal Medicine

## 2015-04-11 ENCOUNTER — Ambulatory Visit (HOSPITAL_COMMUNITY)
Admission: RE | Admit: 2015-04-11 | Discharge: 2015-04-11 | Disposition: A | Discharge status: Home / Self Care | Payer: Medicare Other | Source: Ambulatory Visit | Attending: Internal Medicine | Admitting: Internal Medicine

## 2015-04-11 ENCOUNTER — Encounter (HOSPITAL_COMMUNITY)
Admission: AD | Admit: 2015-04-11 | Discharge: 2015-04-11 | Disposition: A | Discharge status: Home / Self Care | Payer: Medicare Other | Source: Skilled Nursing Facility | Attending: Internal Medicine | Admitting: Internal Medicine

## 2015-04-11 DIAGNOSIS — S92301S Fracture of unspecified metatarsal bone(s), right foot, sequela: Secondary | ICD-10-CM | POA: Diagnosis not present

## 2015-04-11 DIAGNOSIS — R609 Edema, unspecified: Secondary | ICD-10-CM

## 2015-04-11 DIAGNOSIS — L03114 Cellulitis of left upper limb: Secondary | ICD-10-CM

## 2015-04-11 DIAGNOSIS — K219 Gastro-esophageal reflux disease without esophagitis: Secondary | ICD-10-CM

## 2015-04-11 DIAGNOSIS — I638 Other cerebral infarction: Secondary | ICD-10-CM | POA: Diagnosis not present

## 2015-04-11 DIAGNOSIS — I6389 Other cerebral infarction: Secondary | ICD-10-CM

## 2015-04-11 DIAGNOSIS — M069 Rheumatoid arthritis, unspecified: Secondary | ICD-10-CM | POA: Diagnosis not present

## 2015-04-11 NOTE — Progress Notes (Signed)
Patient ID: Robin Blackburn, female   DOB: [birth year], 69 y.o.   MRN: [id number]                .   . This is a routine  acute visit  ACILITY: [facility]:   SNF   CHIEF COMPLAINT: Medical management of chronic medical conditions including bilateral metatarsal fractures-hypertension-depression-anxiety-chronic pain-rheumatoid arthritis-history CVA-- Acute visit status post ER visit for left arm edema and  erythema      HISTORY OF PRESENT ILLNESS:  This is a patient whom we actually had in the building from September to mid October.  At that point, she had suffered an acute thalamic infarct and, after her arrival here, we discovered her to have a fracture of the right fifth metatarsal for which she was referred to Orthopedics.  She was discharged to her own apartment which apparently has four stairs to enter.         She was discovered during this admission to have a minimally displaced fracture of the left fifth metatarsal and, as noted, has the original fracture that we diagnosed about a year ago---.  These issues have been relatively stable and followed by orthopedics--in fact she is now able to wear tennis shoes this appears to be quite stable which is encouraging--we did x-ray her left foot and ankle area recently secondary complaints of increased pain however did not really show any acute changes     does continue to  complain of some chronic pain and muscle discomfort-she is receiving Vicodin-7.5 325 every 4 hours when necessary- She is also on Robaxin  increased this to 7  50 mg every 6 hours when necessary-this appears to be helping some--although she continues to complain of pain at times-   Ishe has seen her rheumatologist  in the past who has prescribed  Plaqueni--and she has been taking this as well recently      .  She also at times has received azithromycin for suspected bronchitis- This complicated with a history of recurrent  allergic rhinitis  she is not complaining of allergy symptoms today-  Was also noted about 2 months ago she did have a guaiac positive stool  others have been negative-she also has a history of hemorrhoids was thought possibly this may have been a bleeding hemorrhoid which contributed to the one positive reading-her hemoglobin actually stable to somewhat improved-she is on a proton pump inhibitor I did discuss this  and she does not at this point want any GI workup-she feels this was a bleeding hemorrhoid and does not want any workup for now---     Patient does have a history of anxiety she is on Xanax as needed this appears to be relatively stable.  In regards to depression she is on Paxil which appears to be stable as well.  She does have a history of hypertension she is on Microzide recent blood pressures appear to be relatively stable--recently 119/61 which appears to be relatively baseline  1-at this point will monitor since she does not appear to be consistently elevated  She  also complained of some bilateral hearing loss she feels this has been essentially since she had her CVA- We did give her a course of D Brox for some earwax issues-however during loss persisted and she has actually seen an ENT who actually manually disimpacted some earwax she does not complain of hearing difficulties today    I saw the patient yesterday  for what appeared to be some cellulitis with increased erythema and edema of her left arm-apparently she had received a pneumonia shot the day previous  Since the erythema-edema was thought to be progressing fairly rapidly she was sent to the ER her lab work was fairly unremarkable except there was an elevated white count of 14,000.  She was discharged on doxycycline-and a venous Doppler also was ordered and apparently that will be completed today-this was to rule out any chance of a DVT   Today she is resting in bed comfortably she says she feels weak in  apparently has at times some loose stools although nursing staff says this is intermittent and not consistent- .    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      Fracture of the right fifth metatarsal in September, now with fracture of the left fifth metatarsal.    Physical deconditioning.    Essential hypertension.    Neuropathy.     Hyponatremia.    Hypokalemia.    History of rheumatoid arthritis, fibromyalgia and reflex sympathetic dystrophy, according to the patient.  Hyperlipidemia.     CURRENT MEDICATIONS:  Medication list is reviewed.            Xanax 1 mg tablet every 6 hours p.r.n.     Plavix 75 q.d.       Bentyl 20 q.6 hours p.r.n., abdominal cramps.    Lomotil 1 tablet two times daily as needed for diarrhea or loose stool.     Microzide 12.5 daily.    Norco 7.5/3.5 q.4 daily.    Prevacid 30 mg b.i.d. daily.    Lidoderm patch to the right knee.    Robaxin 750 mg q.6 p.r.n.     Zofran q.6 p.r.n.     Paxil 10 q.d.    K-dur 20 q.d.    Pravachol 10 q.d.     Restasis ophthalmic 1 drop b.i.d.    Sodium chloride nasal spray 1 spray, each nostril, b.i.d.    Ambien 10 mg q.h.s. P.r.n  Plaquenil 200 mg QD    I note that aspirin and simvastatin were discontinued.    SOCIAL HISTORY:                HOUSING:  The patient lived in her own apartment.  There is apparently a four-stair entrance stairway that she needs to manage.   FUNCTIONAL STATUS:  She has in-home support which she pays for privately, according to her.    REVIEW OF SYSTEMS: In general does not complain of fever chills. Says she feels weak  Skin-left arm issues as noted above she is complaining of some pain in the area some minimal itching.  Head ears eyes nose mouth and throat  Does not complain of headache congestion sore throat nasal drainage- Does not complain of hearing  difficulty today       CHEST/RESPIRATORY:  No shortness of breath. Or increased cough- CARDIAC:   No chest pain.     GI:   Is not complaining of nausea vomiting diarrhea constipation or abdominal discomfort today she has had times complain of some vomiting and diarrhea but that is not the case today  GU does not complain of suprapubic discomfort or dysuria.   MUSCULOSKELETAL:  Somewhat chronic complaints of generalized pain  NeurologicSome previous history of complaints of neuropathic pain does not complain of numbness headache or dizziness currently.--  Psych-does have a history of anxiety this point appears to be relatively stable   increased her Xanax back to 1 mg every 6 hours secondary to increased anxiety symptoms apparently this is helping.     PHYSICAL EXAMINATION:  -she is afebrile pulse of 80 respirations 18 blood pressure 119/61  s Gen.  pleasant elderly female in no distress lying comfortably in her bed   Skin is warm and dry.--Left upper arm there is some pale erythema this actually appears to be less erythematous than yesterday thisextends from her shoulder to her elbow area on the anterior arm posterior arm is within normal limits there is some firmness of the erythema and tenderness  she has somewhat thinning hair but this does not appear overtly changed from previous exams   Eyes pupils appear reactive light sclera and conjunctiva are clear visual acuity appears intact.    Oropharynx is clear mucous membranes moist ---  Nose could not really appreciate any active drainage                 CHEST/RESPIRATORY:  Clear air entry bilaterally. No labored breathing   CARDIOVASCULAR:   CARDIAC:   Heart sounds are normal rate and rhythm.  There are no murmurs.  She appears to be euvolemic--I would say trace lower extremity edema.   GASTROINTESTINAL: Abdomen is obese soft does not appear to be overtly tender -bowel sounds are positive--  L  GENITOURINARY:  BLADDER:    I did not note any bladder distention or overt tenderness MUSCULOSKELETAL:   EXTREMITIES:   BILATERAL LOWER EXTREMITIES:    Moves both at baseline strength-she has minimal lower extremity edema  bilaterally  I do not note any deformities of the left foot there is some mild edema of the knees bilaterally    Strength upper extremities bilaterally appears grossly intact again possibly since mild right-sided weakness but this continues to be minimal  t-.  Neuro I could not really appreciate any overt lateralizing findings she does have a history of CVA her speech is clear cranial nerves appear intact. Does have a history of fairly mild right-sided weakness --minimal deficit here     .    Psych-she is alert and oriented pleasant and appropriate.  Labs  04/10/2015.  Sodium 129 potassium 3.7 BUN 9 creatinine 0.85.  ALT 11 otherwise liver function tests within normal limits.  WBC 14.0 hemoglobin 11.6 platelets 398   02/15/2015.  Sodium 132 potassium 4 BUN 7 creatinine 0.7 to.  WBC 8.9 hemoglobin 12.4 platelets 371.    02/12/2014-vitamin D level XXIV.5 this appears to be rising  January 18 2015.  Sodium 132 potassium 4.2 BUN 9 creatinine 0.68.  WBC 8.0 hemoglobin 12.2 platelets 380  12/20/2014.  Sodium 131 potassium 4.1 BUN 9 creatinine 0.71.  Calcium 8.8.  12/08/2014.  Vitamin D 22.6.  12/04/2014.  WBC 8.9 hemoglobin 11.1 platelets 377.  W in 3.1 AST 13-ALT 12 otherwise liver function tests within normal limits.  Cholesterol 183-triglycerides 269-HDL 47-LDL 82  11/07/2014.  Sodium 131 potassium 4.3 BUN 10 creatinine 0.77.  WBC 7.6 hemoglobin 11.4 platelets 358.  Sedimentation rate 12.  RA factor 7.0  .  10/09/2014.  WBC 8.4 hemoglobin 11.7 platelets 325.  Sodium 132 potassium 4.4 BUN 9 creatinine 0.78 CO2 32.  09/09/2014.  Liver function tests within normal limits.  07/29/2014-TSH within normal limits at 1.463.  Of  note recent vitamin  D level on April 14 was low at 17.6  08/21/2014.  Sodium 134 potassium 3.7 BUN 9 creatinine 0.78.  WBC 9.2 hemoglobin 11.2 platelets 386   08/07/2014.  WBC 9.7 hemoglobin 10.8 platelets 392.  Sodium 135 potassium 3.6 BUN 8 creatinine 0.82  06/28/2014.  WBC 8.5 hemoglobin 12.2 platelets 375.  Sodium 135 potassium 4.4 BUN 8 creatinine 0.8  02/20/2014  Magnesium level II.1.  Cholesterol 253 triglycerides 309H LDL 62-LDL  .     ASSESSMENT/PLAN:                        History of suspected left arm cellulitis-this appears to have stabilized she has been started on doxycycline a venous Doppler is pending-this appears to be somewhat less ominous appearing than when I saw it last night but will have to be watched.  I do note she has a mildly elevated white count we will recheck that later this week                                                        History of allergic rhinitis-she continues on Claritin this has been stable recently but appears to have frequent flares  History of guaiac-positive stool-this is a 1 time positive reading-her hemoglobin has been stable she is on a proton pump inhibitor--as noted above-- she does not want a GI workup at this time--will update her CBC    chronic pain muscle spasms -- rheumatoid arthritis-patient is on a significant dose of Vicodin as noted above- We have increased her Robaxin 750 mg every 6 hours previously apparently this is helping some--Plaquenil has been restarted as well. .        In regards hyperlipidemia this appears stable with cholesterol 183 triglycerides 269 HDL 47 LDL of 82 on lab done 12/04/2014 at this point will monitor she prefers to be off the statin     -question increased anxiety Her Xanax recently was increased back to 1 mg every 6 hours when necessary this appears to be helping     .    History of remote left thalamic CVA.  Very little residual of this she continues on Plavix--.         History of metatarsal fractures bilaterally-this is followed by orthopedics--appears to be stable now essentially wearing street shoes which is encouraging--recent x-ray of left foot did not really show any acute process.         Hyponatremia and hypokalemia.  The etiology here is unclear. But appears to be stable during her stay here --sodium generally runs high 120s to low 130s   History of rheumatoid arthritis, fibromyalgia and reflex sympathetic dystrophy, according to the patient.  She has chronic pain issues, medication changes as noted above-Plaquenil has been restarted                                                                                 Hypertension-at this point appears stable  she is on hydrochlorothiazide-per review I do not see consistent elevations    GERD this appears stable on Prilosec she is no longer complaining symptoms with this will have to be monitored.         .    Depression-at this point is stableon Paxil .                             Insomnia she is on Ambien this was increased secondary patient requests saying the lower dose of Ambien was not effective she appears to have tolerated this well although will have to be watched--currently on 10 mg a day--      vitamin D deficiency-she has been started on supplementation-level on October 6 was 63-       [id number]-of note greater than 35 minutes spent assessing patient-addressing her concerns-reviewing her chart including last night ER visit -and coordinating and formulating a plan of care for numerous diagnoses-of note greater than 50% of time spent coordinating plan of care

## 2015-04-11 NOTE — ED Notes (Signed)
Pt states she thinks the reddened area has increased in size; this RN inspected area and reddened area does not look like it has increased in size during her stay here; the PA from the [facility] marked with a black marker and the borders have not increased a lot more during her stay here

## 2015-04-11 NOTE — ED Notes (Signed)
Pt assisted getting dressed and helped to wheelchair, pt waiting for [facility] staff to transport pt back to [facility]

## 2015-04-11 NOTE — ED Notes (Signed)
Report given to nurse at [facility], she is sending someone to take pt back

## 2015-04-11 NOTE — ED Provider Notes (Signed)
CSN: [id number]     Arrival date & time 04/10/15  1959 History   First MD Initiated Contact with Patient 04/10/15 2013     Chief Complaint  Patient presents with  . Arm Swelling     (Consider location/radiation/quality/duration/timing/severity/associated sxs/prior Treatment) Patient is a 69 y.o. female presenting with rash.  Rash Location:  Shoulder/arm Shoulder/arm rash location:  L upper arm Quality: redness and swelling   Severity:  Mild Onset quality:  Gradual Duration:  1 day Timing:  Constant Chronicity:  New Context: not animal contact, not hot tub use, not insect bite/sting and not medications   Relieved by:  None tried Worsened by:  Nothing tried Ineffective treatments:  None tried Associated symptoms: fever and nausea   Associated symptoms: no abdominal pain, no myalgias and no shortness of breath     Past Medical History  Diagnosis Date  . Neuropathy ([place])   . Hypertension   . Anxiety   . Depression   . Acid reflux   . Rheumatoid arthritis ([place])   . Fibromyalgia   . Stroke ([place]) 02/17/14  . CVA (cerebral infarction) 02/19/2014    Acute left thalamic   Past Surgical History  Procedure Laterality Date  . Back surgery    . Knee surgery    . Ankle reconstruction    . Abdominal hysterectomy    . Appendectomy    . Cholecystectomy     Family History  Problem Relation Age of Onset  . Hypertension Father   . Hypertension Brother   . Transient ischemic attack Father   . CVA Maternal Grandfather   . Leukemia Brother    Social History  Substance Use Topics  . Smoking status: Never Smoker   . Smokeless tobacco: None  . Alcohol Use: No   OB History    No data available     Review of Systems  Constitutional: Positive for fever and chills.  Respiratory: Negative for shortness of breath.   Gastrointestinal: Positive for nausea. Negative for abdominal pain.  Musculoskeletal: Negative for myalgias.  Skin: Positive for rash.  All other systems reviewed  and are negative.     Allergies  Demerol; Hydromorphone; Ciprofloxacin; Iohexol; Levaquin; Penicillins; Percocet; Sulfa antibiotics; Terramycin; and Morphine and related  Home Medications   Prior to Admission medications   Medication Sig Start Date End Date Taking? Authorizing Provider  acetaminophen (TYLENOL) 325 MG tablet Take 650 mg by mouth every 6 (six) hours as needed for mild pain or moderate pain.   Yes Historical Provider, MD  ALPRAZolam Duanne Moron) 1 MG tablet Take 1 tablet (1 mg total) by mouth every 6 (six) hours as needed. anxiety Patient taking differently: Take 1 mg by mouth 3 (three) times daily. anxiety 05/26/14  Yes Estela Leonie Green, MD  calcium-vitamin D (OSCAL WITH D) 500-200 MG-UNIT tablet Take 1 tablet by mouth 2 (two) times daily.   Yes Historical Provider, MD  clopidogrel (PLAVIX) 75 MG tablet Take 75 mg by mouth at bedtime. 05/03/14  Yes Historical Provider, MD  desloratadine (CLARINEX) 5 MG tablet Take 5 mg by mouth daily.   Yes Historical Provider, MD  diclofenac sodium (VOLTAREN) 1 % GEL Apply 3 g topically 3 (three) times daily. Apply to large joints as directed   Yes Historical Provider, MD  dicyclomine (BENTYL) 20 MG tablet Take 20 mg by mouth every 6 (six) hours as needed. Abdominal cramps 01/31/14  Yes Historical Provider, MD  docusate sodium (COLACE) 100 MG capsule Take 100 mg by  mouth daily as needed for mild constipation.   Yes Historical Provider, MD  fluticasone (FLONASE) 50 MCG/ACT nasal spray Place 1 spray into both nostrils 2 (two) times daily.   Yes Historical Provider, MD  hydrochlorothiazide (MICROZIDE) 12.5 MG capsule Take 25 mg by mouth daily.  01/31/14  Yes Historical Provider, MD  HYDROcodone-acetaminophen (NORCO) 7.5-325 MG per tablet Take one tablet by mouth four times daily as needed for pain DO NOT EXCEED 3 GM OF APAP/24 HOURS FROM ALL SOURCES 03/01/15  Yes Tiffany L Reed, DO  hydrocortisone (ANUSOL-HC) 2.5 % rectal cream Place 1  application rectally daily as needed for hemorrhoids or itching.   Yes Historical Provider, MD  hydroxychloroquine (PLAQUENIL) 200 MG tablet Take 200 mg by mouth daily.    Yes Historical Provider, MD  lansoprazole (PREVACID) 30 MG capsule Take 30 mg by mouth 2 (two) times daily. 02/17/14  Yes Historical Provider, MD  loperamide (IMODIUM A-D) 2 MG tablet Take 2-4 mg by mouth 4 (four) times daily as needed for diarrhea or loose stools.   Yes Historical Provider, MD  magic mouthwash SOLN Take 5 mLs by mouth every 6 (six) hours as needed for mouth pain.   Yes Historical Provider, MD  Menthol, Topical Analgesic, (BIOFREEZE EX) Apply 1 application topically 2 (two) times daily.   Yes Historical Provider, MD  methocarbamol (ROBAXIN) 500 MG tablet Take 1 tablet (500 mg total) by mouth every 6 (six) hours as needed for muscle spasms. Patient taking differently: Take 750 mg by mouth every 6 (six) hours as needed for muscle spasms.  02/22/14  Yes Rexene Alberts, MD  PARoxetine (PAXIL) 20 MG tablet Take 20 mg by mouth daily.  05/03/14  Yes Historical Provider, MD  potassium chloride 20 MEQ TBCR Take 20 mEq by mouth daily. 02/22/14  Yes Rexene Alberts, MD  Propylene Glycol 0.6 % SOLN Apply 1 drop to eye daily.   Yes Historical Provider, MD  RESTASIS 0.05 % ophthalmic emulsion Place 1 drop into both eyes 2 (two) times daily. 01/31/14  Yes Historical Provider, MD  sodium chloride (OCEAN) 0.65 % SOLN nasal spray Place 1 spray into both nostrils as needed for congestion. 02/22/14  Yes Rexene Alberts, MD  zolpidem (AMBIEN) 5 MG tablet Take one tablet by mouth at bedtime as needed for insomnia Patient taking differently: Take 5 mg by mouth at bedtime as needed for sleep.  12/22/14  Yes Tiffany L Reed, DO  doxycycline (VIBRAMYCIN) 100 MG capsule Take 1 capsule (100 mg total) by mouth 2 (two) times daily. One po bid x 7 days 04/10/15   Merrily Pew, MD  ondansetron [facility] ODT) 4 MG disintegrating tablet 4mg  ODT q4 hours prn  nausea/vomit 04/10/15   Merrily Pew, MD   BP 124/85 mmHg  Pulse 85  Temp(Src) 99 F (37.2 C) (Oral)  Resp 18  Ht 5' 6.5" (1.689 m)  Wt 192 lb (87.091 kg)  BMI 30.53 kg/m2  SpO2 100% Physical Exam  Constitutional: She appears well-developed and well-nourished.  HENT:  Head: Normocephalic and atraumatic.  Neck: Normal range of motion.  Cardiovascular: Normal rate and regular rhythm.   Pulmonary/Chest: No stridor. No respiratory distress.  Abdominal: She exhibits no distension.  Neurological: She is alert.  Skin: Rash noted.  Nursing note and vitals reviewed.   ED Course  Procedures (including critical care time) Labs Review Labs Reviewed  CBC WITH DIFFERENTIAL/PLATELET - Abnormal; Notable for the following:    WBC 14.0 (*)    Hemoglobin 11.6 (*)  HCT 35.7 (*)    Neutro Abs 9.4 (*)    Monocytes Absolute 1.5 (*)    All other components within normal limits  COMPREHENSIVE METABOLIC PANEL - Abnormal; Notable for the following:    Sodium 129 (*)    Chloride 91 (*)    Glucose, Bld 111 (*)    Calcium 8.8 (*)    ALT 11 (*)    All other components within normal limits  URINALYSIS, ROUTINE W REFLEX MICROSCOPIC (NOT AT [facility]) - Abnormal; Notable for the following:    Hgb urine dipstick TRACE (*)    All other components within normal limits  URINE MICROSCOPIC-ADD ON - Abnormal; Notable for the following:    Squamous Epithelial / LPF FEW (*)    All other components within normal limits    Imaging Review No results found. I have personally reviewed and evaluated these images and lab results as part of my medical decision-making.   EKG Interpretation None      MDM   Final diagnoses:  Cellulitis of left upper extremity   Cellulitis v localized reaction. Less likely DVT but has doppler tomorrow already scheduled. No e/o sepsis. Will tx w/ doxy and have pcp follow up.   I have personally and contemperaneously reviewed labs and imaging and used in my decision making as  above.   A medical screening exam was performed and I feel the patient has had an appropriate workup for their chief complaint at this time and likelihood of emergent condition existing is low. They have been counseled on decision, discharge, follow up and which symptoms necessitate immediate return to the emergency department. They or their family verbally stated understanding and agreement with plan and discharged in stable condition.      Merrily Pew, MD 04/11/15 1459

## 2015-04-13 ENCOUNTER — Non-Acute Institutional Stay (SKILLED_NURSING_FACILITY): Payer: Medicare Other | Admitting: Internal Medicine

## 2015-04-13 DIAGNOSIS — L0390 Cellulitis, unspecified: Secondary | ICD-10-CM | POA: Diagnosis not present

## 2015-04-13 NOTE — Progress Notes (Signed)
Patient ID: Robin Blackburn, female   DOB: 1946-01-08, 69 y.o.   MRN: [id number]  This is an acute visit.  Level care skilled.  Facility [facility].  Chief complaint acute visit follow-up left upper arm cellulitis-.  History of present illness.  Patient is a pleasant 69 year old female who I seen earlier this week for increased edema and erythema on her left upper arm-apparently she had received a pneumonia shot the day previous.  This appear to be fairly rapidly progressing any edema was fairly significant-she did go to the ER and was diagnosed with cellulitis-a venous Doppler also was ordered which was completed the next day.  The Doppler has come back negative for any DVT.  She was started on doxycycline and this appears to help significantly this looks significantly better today there is certainly less edema and very minimal erythema this appears to be resolving quite rapidly and unremarkably.  Family medical social historyhas been reviewed.  Medications have been reviewed.per [facility]  Review of systems.  In general does not complain of any fever or chills.  Her straight no complaint shortness breath or cough.  Cardiac no chest pain minimal lower extremity edema.  GI does complain at times of some diarrhea currently on Lomotil however there is a drug interaction with her potassium did discuss this with her and she is agreeable to going back to Imodium when necessary.  Muscle skeletal is not really complaining of arm pain today.  Physical exam.  She is afebrile pulses 72 respirations 18.  In general this is a pleasant elderly resident in no distress sitting comfortably in her chair.  Her skin is warm and dry-left upper arm there is still some residual pale erythema this looks significantly improved from earlier this week it is not really warm to touch-it is quite pale erythema and appears to be receding --edema appears to be essentially resolved as well.  Heart is regular rate  and rhythm without murmur gallop or rub.  Her chest is clear to auscultation there is no labored breathing.  Labs  04/10/2015.  WBC 14.0 hemoglobin 11.6 platelets 398.  Sodium 129 potassium 3.7 UN 9 creatinine 0.85.  Assessment and plan.  #1-left upper extremity cellulitis this appears significantly improved she did go to the ER her white count was up I suspect this was secondary to a cellulitis which appears to be at this point resolving unremarkably.  At this point continue to monitor for resolution  [id number]    .

## 2015-04-16 DIAGNOSIS — R609 Edema, unspecified: Secondary | ICD-10-CM | POA: Insufficient documentation

## 2015-04-16 DIAGNOSIS — L0390 Cellulitis, unspecified: Secondary | ICD-10-CM | POA: Insufficient documentation

## 2015-04-16 NOTE — Progress Notes (Signed)
Patient ID: Robin Blackburn, female   DOB: 1945-08-19, 69 y.o.   MRN: [id number]                 .   . This is an  acute visit  ACILITY: [facility]:   SNF   CHIEF COMPLAINT:  Acute visit secondary to erythema and edema of the left upper arm      HISTORY OF PRESENT ILLNESS  This is a pleasant 69 year old female lady has developed some increased edema and erythema of her left upper arm today.  Patient tells me she received a pneumonia shot yesterday.  She says the erythema appears to be increasing fairly significantly today as well as the edema she does complain of some tenderness here-she does have a low-grade temperature-she does not complain of fever or chills:           .      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      Fracture of the right fifth metatarsal in September,  fracture of the left fifth metatarsal-- an x-ray did show complete healing of this.    Physical deconditioning.    Essential hypertension.    Neuropathy.     Hyponatremia.    Hypokalemia.    History of rheumatoid arthritis, fibromyalgia and reflex sympathetic dystrophy, according to the patient.  Hyperlipidemia.     CURRENT MEDICATIONS:  Medication list is reviewed.            Xanax 1 mg tablet every 6 hours p.r.n.     Plavix 75 q.d.       Bentyl 20 q.6 hours p.r.n., abdominal cramps.    Lomotil 1 tablet two times daily as needed for diarrhea or loose stool.     Microzide 12.5 daily.    Norco 7.5/3.5 q.4 daily.    Prevacid 30 mg . daily.    Lidoderm patch to the right knee.    Robaxin 750 mg q.6 p.r.n.     Zofran q.6 p.r.n.     Paxil 10 q.d.    K-dur 20 q.d.    Pravachol 10 q.d.     Restasis ophthalmic 1 drop b.i.d.    Sodium chloride nasal spray 1 spray, each nostril, b.i.d.    Ambien 10 mg q.h.s. P.r.n  Plaqueni --has just been restarted      I note that aspirin and simvastatin were discontinued.    SOCIAL HISTORY:                HOUSING:  The patient lived in her own apartment.  There is apparently a four-stair entrance stairway that she needs to manage.   FUNCTIONAL STATUS:  She has in-home support which she pays for privately, according to her.    REVIEW OF SYSTEMS: In general does not complain of fever chills.   Skin i--erythema left upper arm with edema there is some tenderness.  Head ears eyes nose mouth and throat   Had been   complaining of nasal drainage is not complaining of black today       CHEST/RESPIRATORY:  No shortness of breath. Or increased cough- CARDIAC:   No chest pain.     GI:   Is not complaining of nausea vomiting diarrhea constipation or abdominal discomfort today she has had times complain of some vomiting and diarrhea but that is  not the case today  GU does not complain of suprapubic discomfort or dysuria.   MUSCULOSKELETAL:  Somewhat chronic complaints of generalized pain --more so of her left upper arm tonight   NeurologicSome previous history of complaints of neuropathic pain does not complain of numbness headache or dizziness currently.--  Psych-does have a history of anxiety this point appears to be relatively stable       PHYSICAL EXAMINATION:  Temperature is 99.0 pulse 85 respirations 18 blood pressure is pending s Gen.  pleasant elderly female in no distress sitting comfortably in her wheelchair   Skin is warm and dry.  Her left upper arm does have increased edema there is some firmness and tenderness to palpation here it is erythematous and warm to touch this extends essentially the anterior aspect of her left upper arm it does not extend to the back of her arm at the erythema again is warm and somewhat tender I do not note any active drainage or erythema extends from the shoulder down to area superior to the elbow   Eyes pupils appear reactive light sclera and conjunctiva  are clear visual acuity appears intact.    Oropharynx is clear mucous membranes moist --- tongue is midline I do not note any edema  Do not really appreciate any overt  Abnormalities--oropharynx is clear                   CHEST/RESPIRATORY:  Clear air entry bilaterally. No labored breathing   CARDIOVASCULAR:   CARDIAC:   Heart sounds are normal rate and rhythm.  There are no murmurs.  She appears to be euvolemic--I would say trace lower extremity edema.   GASTROINTESTINAL: Abdomen is obese soft does not appear to be overtly tender -bowel sounds are positive--  L    MUSCULOSKELETAL:   EXTREMITIES:   BILATERAL LOWER EXTREMITIES:    Moves both at baseline strength-she has minimal lower extremity edema  bilaterally  I Upper extremities-changes to the left arm as noted above I do note she does have a radial pulse      t-.  Neuro I could not really appreciate any overt lateralizing findings she does have a history of CVA her speech is clear cranial nerves appear intact. Does have a history of fairly mild right-sided weakness --minimal deficit here     .    Psych-she is alert and oriented pleasant and appropriate.  Labs  04/09/2015.  Sodium 127 potassium 4.1 BUN 8 creatinine 0.72.  04/06/2015.  WBC 7.4 hemoglobin 11.5 platelets 396.    03/23/2015.  Sodium 132 potassium 4.1 BUN 11 creatinine 0.82.  03/15/2015.  WBC 7.0 hemoglobin 11.4 platelets 347.    02/15/2015.  Sodium 132 potassium 4 BUN 7 creatinine 0.7 to.  WBC 8.9 hemoglobin 12.4 platelets 371.    02/12/2014-vitamin D level XXIV.5 this appears to be rising  January 18 2015.  Sodium 132 potassium 4.2 BUN 9 creatinine 0.68.  WBC 8.0 hemoglobin 12.2 platelets 380  12/20/2014.  Sodium 131 potassium 4.1 BUN 9 creatinine 0.71.  Calcium 8.8.  12/08/2014.  Vitamin D 22.6.  12/04/2014.  WBC 8.9 hemoglobin 11.1 platelets 377.  W in 3.1 AST 13-ALT 12 otherwise liver function tests within  normal limits.  Cholesterol 183-triglycerides 269-HDL 47-LDL 82  11/07/2014.  Sodium 131 potassium 4.3 BUN 10 creatinine 0.77.  WBC 7.6 hemoglobin 11.4 platelets 358.  Sedimentation rate 12.  RA factor 7.0        ASSESSMENT/PLAN  Left upper arm  edema and erythema-this appears to be cellulitic and  on presentation initially plan to start doxycycline however I am concerned on reevaluation of the progression of this erythema-I would like to send her to the ER for their evaluation-I believe a venous Doppler to rule out DVT is also warranted-again there may be some logistical issues with obtaining that this evening but would like her sent to the ER since this appears to be progressing fairly rapidly I would like their evaluation -as well as obtaining stat blood work   [phone]

## 2015-04-18 ENCOUNTER — Other Ambulatory Visit: Payer: Self-pay

## 2015-04-18 MED ORDER — HYDROCODONE-ACETAMINOPHEN 7.5-325 MG PO TABS: ORAL_TABLET | ORAL | Status: DC

## 2015-04-18 NOTE — Telephone Encounter (Signed)
RX faxed to [facility] @ [phone]. Phone number [phone]  

## 2015-05-24 ENCOUNTER — Encounter (HOSPITAL_COMMUNITY)
Admission: AD | Admit: 2015-05-24 | Discharge: 2015-05-24 | Disposition: A | Discharge status: Home / Self Care | Payer: Medicare Other | Source: Skilled Nursing Facility | Attending: Internal Medicine | Admitting: Internal Medicine

## 2015-05-24 DIAGNOSIS — I10 Essential (primary) hypertension: Secondary | ICD-10-CM | POA: Diagnosis present

## 2015-05-24 LAB — BASIC METABOLIC PANEL
Anion gap: 6 (ref 5–15)
BUN: 9 mg/dL (ref 6–20)
CALCIUM: 8.7 mg/dL — AB (ref 8.9–10.3)
CO2: 31 mmol/L (ref 22–32)
CREATININE: 0.74 mg/dL (ref 0.44–1.00)
Chloride: 97 mmol/L — ABNORMAL LOW (ref 101–111)
GFR calc non Af Amer: >60 mL/min (ref >60)
GLUCOSE: 111 mg/dL — AB (ref 65–99)
Potassium: 4.6 mmol/L (ref 3.5–5.1)
Sodium: 134 mmol/L — ABNORMAL LOW (ref 135–145)

## 2015-05-25 ENCOUNTER — Encounter (HOSPITAL_COMMUNITY)
Admission: AD | Admit: 2015-05-25 | Discharge: 2015-05-25 | Disposition: A | Discharge status: Home / Self Care | Payer: Medicare Other | Source: Skilled Nursing Facility | Attending: Internal Medicine | Admitting: Internal Medicine

## 2015-05-25 DIAGNOSIS — I10 Essential (primary) hypertension: Secondary | ICD-10-CM | POA: Diagnosis not present

## 2015-05-25 LAB — SODIUM: Sodium: 132 mmol/L — ABNORMAL LOW (ref 135–145)

## 2015-05-30 ENCOUNTER — Non-Acute Institutional Stay (SKILLED_NURSING_FACILITY): Payer: Medicare Other | Admitting: Internal Medicine

## 2015-05-30 ENCOUNTER — Encounter: Payer: Self-pay | Admitting: Internal Medicine

## 2015-05-30 DIAGNOSIS — H6123 Impacted cerumen, bilateral: Secondary | ICD-10-CM

## 2015-05-30 DIAGNOSIS — M069 Rheumatoid arthritis, unspecified: Secondary | ICD-10-CM

## 2015-05-30 DIAGNOSIS — G894 Chronic pain syndrome: Secondary | ICD-10-CM | POA: Diagnosis not present

## 2015-05-30 DIAGNOSIS — S92351D Displaced fracture of fifth metatarsal bone, right foot, subsequent encounter for fracture with routine healing: Secondary | ICD-10-CM | POA: Diagnosis not present

## 2015-05-30 DIAGNOSIS — I639 Cerebral infarction, unspecified: Secondary | ICD-10-CM | POA: Diagnosis not present

## 2015-05-30 DIAGNOSIS — I10 Essential (primary) hypertension: Secondary | ICD-10-CM

## 2015-05-30 DIAGNOSIS — J329 Chronic sinusitis, unspecified: Secondary | ICD-10-CM

## 2015-05-30 DIAGNOSIS — E871 Hypo-osmolality and hyponatremia: Secondary | ICD-10-CM | POA: Diagnosis not present

## 2015-05-30 DIAGNOSIS — H6120 Impacted cerumen, unspecified ear: Secondary | ICD-10-CM | POA: Insufficient documentation

## 2015-05-30 NOTE — Progress Notes (Signed)
Patient ID: Robin Blackburn, female   DOB: 1945-10-12, 69 y.o.   MRN: [id number]                 .   . This is a routine   visit  ACILITY: [facility]:   SNF   CHIEF COMPLAINT: Medical management of chronic medical conditions including bilateral metatarsal fractures-hypertension-depression-anxiety-chronic pain-rheumatoid arthritis-history CVA-- Acute visit secondary to complaints of head congestion sinus issues      HISTORY OF PRESENT ILLNESS:  This is a patient whom we actually had in the building from September to mid October.  At that point, she had suffered an acute thalamic infarct and, after her arrival here, we discovered her to have a fracture of the right fifth metatarsal for which she was referred to Orthopedics.  She was discharged to her own apartment which apparently has four stairs to enter.         She was discovered during this admission to have a minimally displaced fracture of the left fifth metatarsal and, as noted, has the original fracture that we diagnosed about a year ago---.  These issues have been relatively stable and followed by orthopedics--in fact she is now able to wear tennis shoes this appears to be quite stable which is encouraging--we did x-ray her left foot and ankle area recently secondary complaints of increased pain however did not really show any acute changes She recently is complaining of pain here again and we do have an orthopedic consult pending--     does continue to  complain of some chronic pain and muscle discomfort-she is receiving Vicodin-7.5 325 every 4 hours when necessary- She is also on Robaxin  increased this to 7  50 mg every 6 hours when necessary-this appears to be helping some--although she continues to complain of pain at times-   Ishe has seen her rheumatologist  in the past who has prescribed  Plaqueni--and she has been taking this as well recently      .  She also at times has  received azithromycin for suspected bronchitis- This complicated with a history of recurrent allergic rhinitis  Sinusitis-- is complaining of head congestion and nasal drainage today she usually does respond to a course of azithromycin She does feel her ears are plugged up this evening    Was also noted about 3 months ago she did have a guaiac positive stool  others have been negative-she also has a history of hemorrhoids was thought possibly this may have been a bleeding hemorrhoid which contributed to the one positive reading-her hemoglobin actually stable to somewhat improved-she is on a proton pump inhibitor I did discuss this  and she does not at this point want any GI workup-she feels this was a bleeding hemorrhoid and does not want any workup for now---     Patient does have a history of anxiety she is on Xanax as needed this appears to be relatively stable.  In regards to depression she is on Paxil which appears to be stable as well.  She does have a history of hypertension she is on Microzide recent blood pressures appear to be relatively stable--it was 130/84 manual this evening-- previous blood pressure 144/85-155/81-I suspect some of these readings are taken with a smaller cuff which I feel may lead to artificially higher readings 1-at this point will monitor since she does not appear to be consistently elevated  She  also complained of some bilateral  hearing loss she feels this has been essentially since she had her CVA- We did give her a course of D Brox for some earwax issues-however during loss persisted and she has actually seen an ENT who actually manually disimpacted some earwax --and she does feel she has some wax again       She was seen about a month ago for some increased erythema of her left arm-did go to the ER-she was started on doxycycline a venous Doppler was negative for any DVT-this resolved quite unremarkably .     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      Fracture of the right fifth metatarsal in September, now with fracture of the left fifth metatarsal.    Physical deconditioning.    Essential hypertension.    Neuropathy.     Hyponatremia.    Hypokalemia.    History of rheumatoid arthritis, fibromyalgia and reflex sympathetic dystrophy, according to the patient.  Hyperlipidemia.     CURRENT MEDICATIONS:  Medication list is reviewed.            Xanax 1 mg tablet every 6 hours p.r.n.     Plavix 75 q.d.       Bentyl 20 q.6 hours p.r.n., abdominal cramps.    Lomotil 1 tablet two times daily as needed for diarrhea or loose stool.     Microzide 12.5 daily.    Norco 7.5/3.5 q.4 daily.    Prevacid 30 mg b.i.d. daily.    Lidoderm patch to the right knee.    Robaxin 750 mg q.6 p.r.n.     Zofran q.6 p.r.n.     Paxil 10 q.d.    K-dur 20 q.d.    Pravachol 10 q.d.     Restasis ophthalmic 1 drop b.i.d.    Sodium chloride nasal spray 1 spray, each nostril, b.i.d.    Ambien 10 mg q.h.s. P.r.n  Plaquenil 200 mg QD    I note that aspirin and simvastatin were discontinued.    SOCIAL HISTORY:                HOUSING:  The patient lived in her own apartment.  There is apparently a four-stair entrance stairway that she needs to manage.   FUNCTIONAL STATUS:  She has in-home support which she pays for privately, according to her.    REVIEW OF SYSTEMS: In general does not complain of fever chills.   Skin- Does not complain of rashes or itching  Head ears eyes nose mouth and throat   Feels her ears are plugged  Feel she has some increased head congestion sinus headache and nasal drainage       CHEST/RESPIRATORY:  --Does not complain of shortness of breath symptoms appear to be more sinus related head congestion drainage CARDIAC:   No chest pain.     GI:   Is not complaining of nausea vomiting diarrhea constipation or abdominal discomfort  today she has had times complain of some vomiting and diarrhea but that is not the case today  GU does not complain of suprapubic discomfort or dysuria.   MUSCULOSKELETAL:  Somewhat chronic complaints of generalized pain --more so in her feet as noted above   NeurologicSome previous history of complaints of neuropathic pain does not complain of numbness h or dizziness currently.--  Psych-does have a history of anxiety this point appears to be relatively stable  increased her Xanax back to 1 mg every 6 hours secondary to increased anxiety symptoms apparently this is helping.     PHYSICAL EXAMINATION:  -Temperature is 97.8 pulse 72 respirations 20 blood pressure 130/84--weight is 2 913.4  s Gen.  pleasant elderly female in no distress    Skin is warm and dry.-- Left arm cellulitis appears resolved   Eyes pupils appear reactive light sclera and conjunctiva are clear visual acuity appears intact.    Oropharynx is clear mucous membranes moist--appears to have some increased clear drainage   Ears-tympanic membranes are visualized there is some moderate wax in ears bilaterally              CHEST/RESPIRATORY:  Clear air entry bilaterally. No labored breathing   CARDIOVASCULAR:   CARDIAC:   Heart sounds are normal rate and rhythm.  There are no murmurs.  She appears to be euvolemic--I would say minimal lower extremity edema.   GASTROINTESTINAL: Abdomen is obese soft does not appear to be overtly tender -bowel sounds are positive--  L  GENITOURINARY:  BLADDER:   I did not note any bladder distention or overt tenderness MUSCULOSKELETAL:   EXTREMITIES:   BILATERAL LOWER EXTREMITIES:    Moves both at baseline strength-she has minimal lower extremity edema  bilaterally  I do not note any deformities of the left foot there is some mild edema of the knees bilaterally There is some very minimal tenderness to palpation of the right foot more under the foot pedal pulses intact I do not note  again any deformity    Strength upper extremities bilaterally appears grossly intact again possibly since mild right-sided weakness but this continues to be minimal  t-.  Neuro I could not really appreciate any overt lateralizing findings she does have a history of CVA her speech is clear cranial nerves appear intact. Does have a history of fairly mild right-sided weakness --minimal deficit      .    Psych-she is alert and oriented pleasant and appropriate.  Labs  05/25/2015.  Sodium 132  05/24/2015.  Sodium 134 potassium 4.6 BUN 9 creatinine 0.74.    04/10/2015.  Sodium 129 potassium 3.7 BUN 9 creatinine 0.85.  ALT 11 otherwise liver function tests within normal limits.  WBC 14.0 hemoglobin 11.6 platelets 398   02/15/2015.  Sodium 132 potassium 4 BUN 7 creatinine 0.7 to.  WBC 8.9 hemoglobin 12.4 platelets 371.    02/12/2014-vitamin D level XXIV.5 this appears to be rising  January 18 2015.  Sodium 132 potassium 4.2 BUN 9 creatinine 0.68.  WBC 8.0 hemoglobin 12.2 platelets 380  12/20/2014.  Sodium 131 potassium 4.1 BUN 9 creatinine 0.71.  Calcium 8.8.  12/08/2014.  Vitamin D 22.6.  12/04/2014.  WBC 8.9 hemoglobin 11.1 platelets 377.  W in 3.1 AST 13-ALT 12 otherwise liver function tests within normal limits.  Cholesterol 183-triglycerides 269-HDL 47-LDL 82  11/07/2014.  Sodium 131 potassium 4.3 BUN 10 creatinine 0.77.  WBC 7.6 hemoglobin 11.4 platelets 358.  Sedimentation rate 12.  RA factor 7.0  .  10/09/2014.  WBC 8.4 hemoglobin 11.7 platelets 325.  Sodium 132 potassium 4.4 BUN 9 creatinine 0.78 CO2 32.  09/09/2014.  Liver function tests within normal limits.  07/29/2014-TSH within normal limits at 1.463.  Of note recent vitamin D level on April 14 was low at 17.6  08/21/2014.  Sodium 134 potassium 3.7 BUN 9 creatinine 0.78.  WBC 9.2 hemoglobin 11.2 platelets 386   08/07/2014.  WBC 9.7 hemoglobin 10.8  platelets  392.  Sodium 135 potassium 3.6 BUN 8 creatinine 0.82  06/28/2014.  WBC 8.5 hemoglobin 12.2 platelets 375.  Sodium 135 potassium 4.4 BUN 8 creatinine 0.8  02/20/2014  Magnesium level II.1.  Cholesterol 253 triglycerides 309H LDL 62-LDL  .     ASSESSMENT/PLAN:  History of suspected sinusitis headache nasal drainage-she has been on azithromycin in the past and says this has helped fairly dramatically-will start a short course of 5 days here she continues on Claritin has Flonase as well at this point monitor.  She is complaining of associated ears plugged as well will give her a another course of the Debrox she did have a significant wax buildup requiring ENT consult last time.                          History of suspected left arm cellulitis- This has resolved there is been no recurrence  I                                                      History of allergic rhinitis                               This may be contributing to her symptoms today she is on Claritin also takes Flonase intermittently   History of guaiac-positive stool-this is a 1 time positive reading-her hemoglobin has been stable she is on a proton pump inhibitor--as noted above-- she does not want a GI workup at this time--will update her CBC    chronic pain muscle spasms -- rheumatoid arthritis-patient is on a significant dose of Vicodin as noted above- We have increased her Robaxin 750 mg every 6 hours previously apparently this is helping some--Plaquenil has been restarted as well. .        In regards hyperlipidemia this appears stable with cholesterol 183 triglycerides 269 HDL 47 LDL of 82 on lab done 12/04/2014 at this point will monitor she prefers to be off the statin      anxiety Her Xanax rhas been increased back to 1 mg every 6 hours when necessary this appears to be helping     .    History of remote left thalamic CVA.  Very little residual of this she continues on Plavix--.         History of metatarsal fractures bilaterally-this is followed by orthopedics--appears to be stable now essentially wearing street shoes which is encouraging--recent x-ray of left foot did not really show any acute process--she is complaining of some foot pain however bilaterally and orthopedic follow-up has been scheduled.         Hyponatremia and hypokalemia.  The etiology here is unclear. But appears to be stable during her stay here --sodium generally runs high 120s to low 130s --most recently   132 we will continue to do updates on this  History of rheumatoid arthritis, fibromyalgia and reflex sympathetic dystrophy, according to the patient.  She has chronic pain issues, medication changes as noted above-Plaquenil has been restarted  Hypertension-at this point appears fairly stable   taken manually tonight it was 130/84-she does have some variations listed-using a larger cuff to take her readings will have to be encouraged    GERD this appears stable on Prilosec she is no longer complaining symptoms with this will have to be monitored.         .    Depression-at this point is stableon Paxil .                             Insomnia she is on Ambien this was increased secondary patient requests saying the lower dose of Ambien was not effective she appears to have tolerated this well although will have to be watched--      vitamin D deficiency-she has been started on supplementation-level on October 6 was 33-       [id number]- Of note greater than 40 minutes spent assessing patient-reviewing her chart-addressing her concerns -and coordinating and formulating a plan of care for numerous diagnoses-of note greater than 50% of time spent coordinating plan of care

## 2015-06-04 ENCOUNTER — Encounter (HOSPITAL_COMMUNITY)
Admission: RE | Admit: 2015-06-04 | Discharge: 2015-06-04 | Disposition: A | Discharge status: Home / Self Care | Payer: Medicare Other | Source: Skilled Nursing Facility | Attending: Internal Medicine | Admitting: Internal Medicine

## 2015-06-04 DIAGNOSIS — I10 Essential (primary) hypertension: Secondary | ICD-10-CM | POA: Diagnosis not present

## 2015-06-04 LAB — CBC WITH DIFFERENTIAL/PLATELET
Basophils Absolute: 0.1 10*3/uL (ref 0.0–0.1)
Basophils Relative: 1 %
EOS ABS: 0.3 10*3/uL (ref 0.0–0.7)
EOS PCT: 3 %
HCT: 34.4 % — ABNORMAL LOW (ref 36.0–46.0)
Hemoglobin: 11.6 g/dL — ABNORMAL LOW (ref 12.0–15.0)
LYMPHS ABS: 2.3 10*3/uL (ref 0.7–4.0)
LYMPHS PCT: 20 %
MCH: 30.9 pg (ref 26.0–34.0)
MCHC: 33.7 g/dL (ref 30.0–36.0)
MCV: 91.5 fL (ref 78.0–100.0)
MONO ABS: 1.4 10*3/uL — AB (ref 0.1–1.0)
MONOS PCT: 12 %
Neutro Abs: 7.3 10*3/uL (ref 1.7–7.7)
Neutrophils Relative %: 64 %
PLATELETS: 387 10*3/uL (ref 150–400)
RBC: 3.76 MIL/uL — AB (ref 3.87–5.11)
RDW: 12.9 % (ref 11.5–15.5)
WBC: 11.4 10*3/uL — AB (ref 4.0–10.5)

## 2015-06-04 LAB — BASIC METABOLIC PANEL
Anion gap: 7 (ref 5–15)
BUN: 7 mg/dL (ref 6–20)
CHLORIDE: 94 mmol/L — AB (ref 101–111)
CO2: 30 mmol/L (ref 22–32)
CREATININE: 0.73 mg/dL (ref 0.44–1.00)
Calcium: 8.7 mg/dL — ABNORMAL LOW (ref 8.9–10.3)
GFR calc Af Amer: >60 mL/min (ref >60)
GFR calc non Af Amer: >60 mL/min (ref >60)
GLUCOSE: 139 mg/dL — AB (ref 65–99)
Potassium: 4.1 mmol/L (ref 3.5–5.1)
SODIUM: 131 mmol/L — AB (ref 135–145)

## 2015-06-08 ENCOUNTER — Other Ambulatory Visit: Payer: Self-pay | Admitting: *Deleted

## 2015-06-08 MED ORDER — HYDROCODONE-ACETAMINOPHEN 7.5-325 MG PO TABS: ORAL_TABLET | ORAL | Status: DC

## 2015-06-08 NOTE — Telephone Encounter (Signed)
[facility] 

## 2015-06-15 ENCOUNTER — Non-Acute Institutional Stay (SKILLED_NURSING_FACILITY): Payer: Medicare Other | Admitting: Internal Medicine

## 2015-06-15 DIAGNOSIS — H6121 Impacted cerumen, right ear: Secondary | ICD-10-CM | POA: Diagnosis not present

## 2015-06-15 NOTE — Progress Notes (Signed)
Patient ID: Robin Blackburn, female   DOB: December 18, 1945, 70 y.o.   MRN: [id number]    This is an acute visit.  Level care skilled.  Facility [facility].  Chief complaint-acute visit secondary to feeling she still has a wax in her right ear.  History of present was.  Patient is a pleasant 70 year old female who recently complained of some decreased hearing-she eventually did see an ENT who manually disimpacted apparently some wax in her ears.  Subsequently she feels she again has some wax in her ears we did give her a round of theDebrox but says in her right ear she still feels she is having some residual wax.  She states she is not sure that the right ear was flushed after the D Brox was applied for 3 days  Family medical social history as been reviewed most recently progress note on 05/30/2015.  Medications have been reviewed per MAR.  Review of systems-as stated in history of present illness she does not really complain of ear pain but feels she has some impaction in her right ear.  Physical exam.  Right ear appears to have possibly a very small amount of wax tympanic membrane is visualized appears to be pearly gray I do not see redness drainage or exudate.  Left ear appears to be within normal limits.  Assessment and plan.  Will order D Brox for another 3 days twice a day 10 drops to right ear-flushed with warm water -- day 4--monitor for resolution.  [id number].

## 2015-06-21 ENCOUNTER — Encounter (HOSPITAL_COMMUNITY)
Admission: AD | Admit: 2015-06-21 | Discharge: 2015-06-21 | Disposition: A | Discharge status: Home / Self Care | Payer: Medicare Other | Source: Skilled Nursing Facility | Attending: Internal Medicine | Admitting: Internal Medicine

## 2015-06-21 DIAGNOSIS — I10 Essential (primary) hypertension: Secondary | ICD-10-CM | POA: Insufficient documentation

## 2015-06-22 LAB — VITAMIN D 25 HYDROXY (VIT D DEFICIENCY, FRACTURES): Vit D, 25-Hydroxy: 22.2 ng/mL — ABNORMAL LOW (ref 30.0–100.0)

## 2015-07-01 ENCOUNTER — Non-Acute Institutional Stay (SKILLED_NURSING_FACILITY): Payer: Medicare Other | Admitting: Internal Medicine

## 2015-07-01 DIAGNOSIS — M069 Rheumatoid arthritis, unspecified: Secondary | ICD-10-CM

## 2015-07-01 DIAGNOSIS — M797 Fibromyalgia: Secondary | ICD-10-CM | POA: Diagnosis not present

## 2015-07-02 ENCOUNTER — Encounter (HOSPITAL_COMMUNITY)
Admission: RE | Admit: 2015-07-02 | Discharge: 2015-07-02 | Disposition: A | Discharge status: Home / Self Care | Payer: Medicare Other | Source: Skilled Nursing Facility | Attending: Internal Medicine | Admitting: Internal Medicine

## 2015-07-02 DIAGNOSIS — I10 Essential (primary) hypertension: Secondary | ICD-10-CM | POA: Diagnosis not present

## 2015-07-02 LAB — SEDIMENTATION RATE: Sed Rate: 18 mm/hr (ref 0–22)

## 2015-07-02 LAB — C-REACTIVE PROTEIN: CRP: 1.4 mg/dL — ABNORMAL HIGH (ref <1.0)

## 2015-07-03 LAB — CYCLIC CITRUL PEPTIDE ANTIBODY, IGG/IGA: CCP Antibodies IgG/IgA: 9 units (ref 0–19)

## 2015-07-03 LAB — RHEUMATOID FACTOR: Rhuematoid fact SerPl-aCnc: <10 IU/mL (ref 0.0–13.9)

## 2015-07-05 IMAGING — CR DG FOOT COMPLETE 3+V*R*
3 series · 3 of 3 positions shown · non-contrast
Comparison: None.

CLINICAL DATA: Right lower extremity pain.

EXAM:
RIGHT TIBIA AND FIBULA - 2 VIEW; RIGHT ANKLE - COMPLETE 3+ VIEW;
RIGHT FOOT COMPLETE - 3+ VIEW

[view not recorded (1 of 3)]
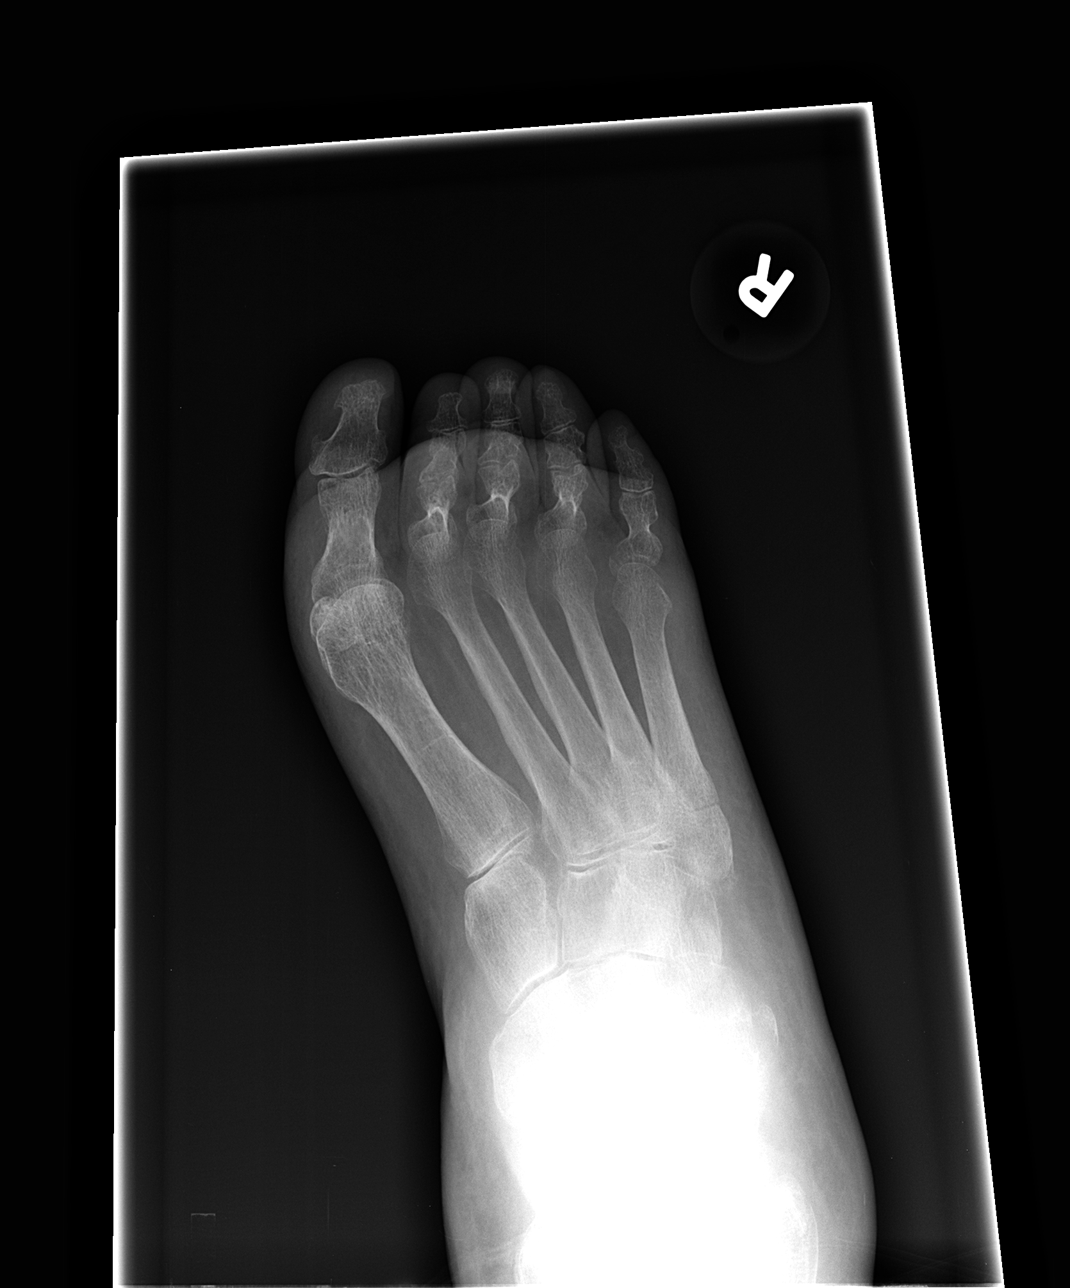

[view not recorded (2 of 3)]
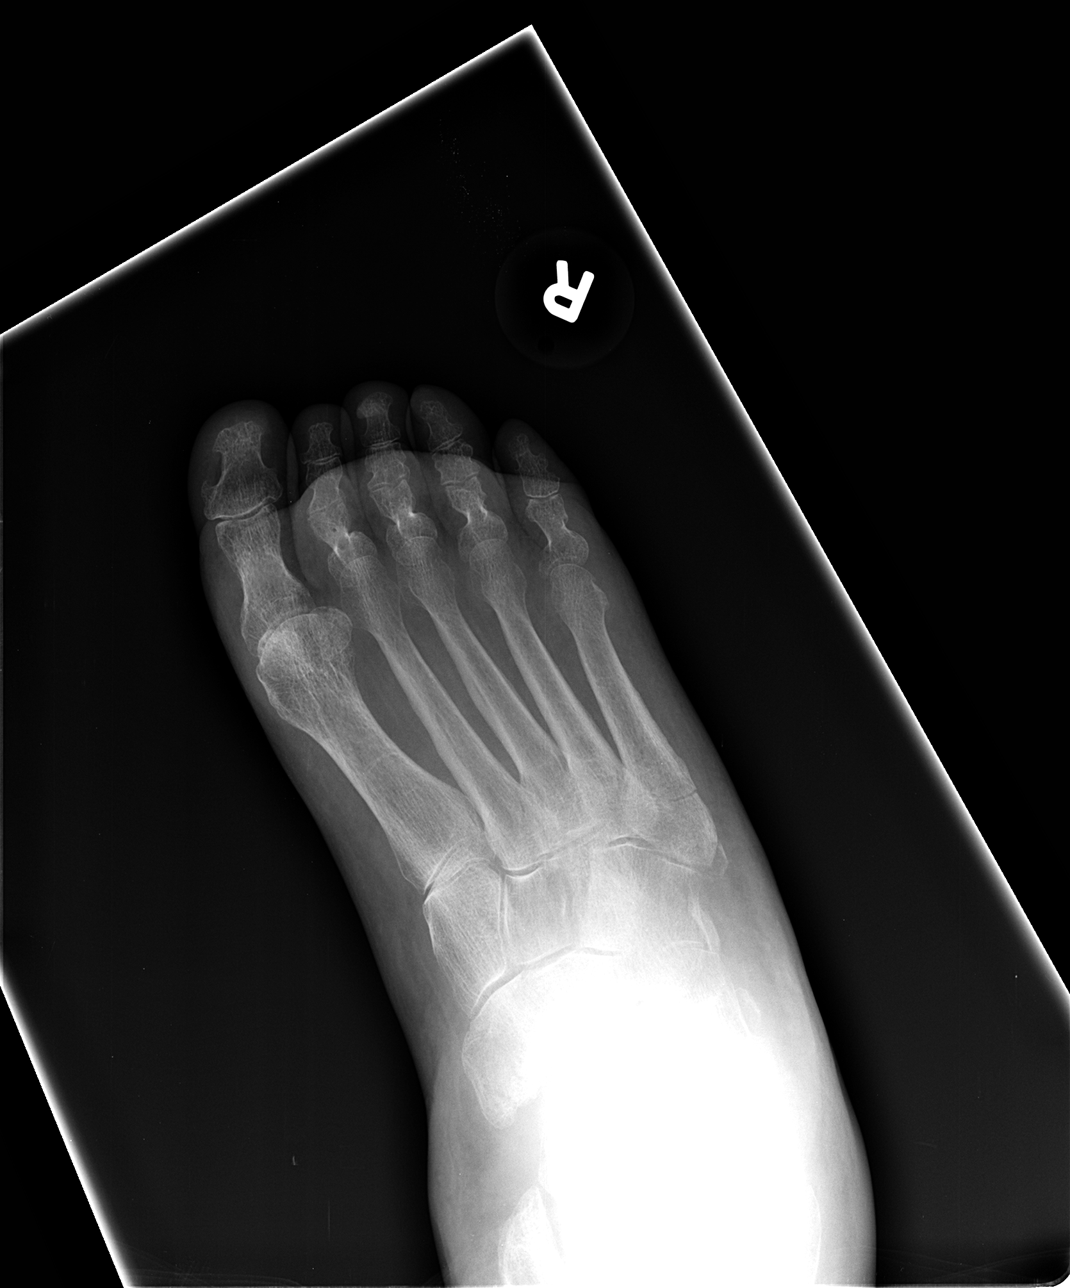

[view not recorded (3 of 3)]
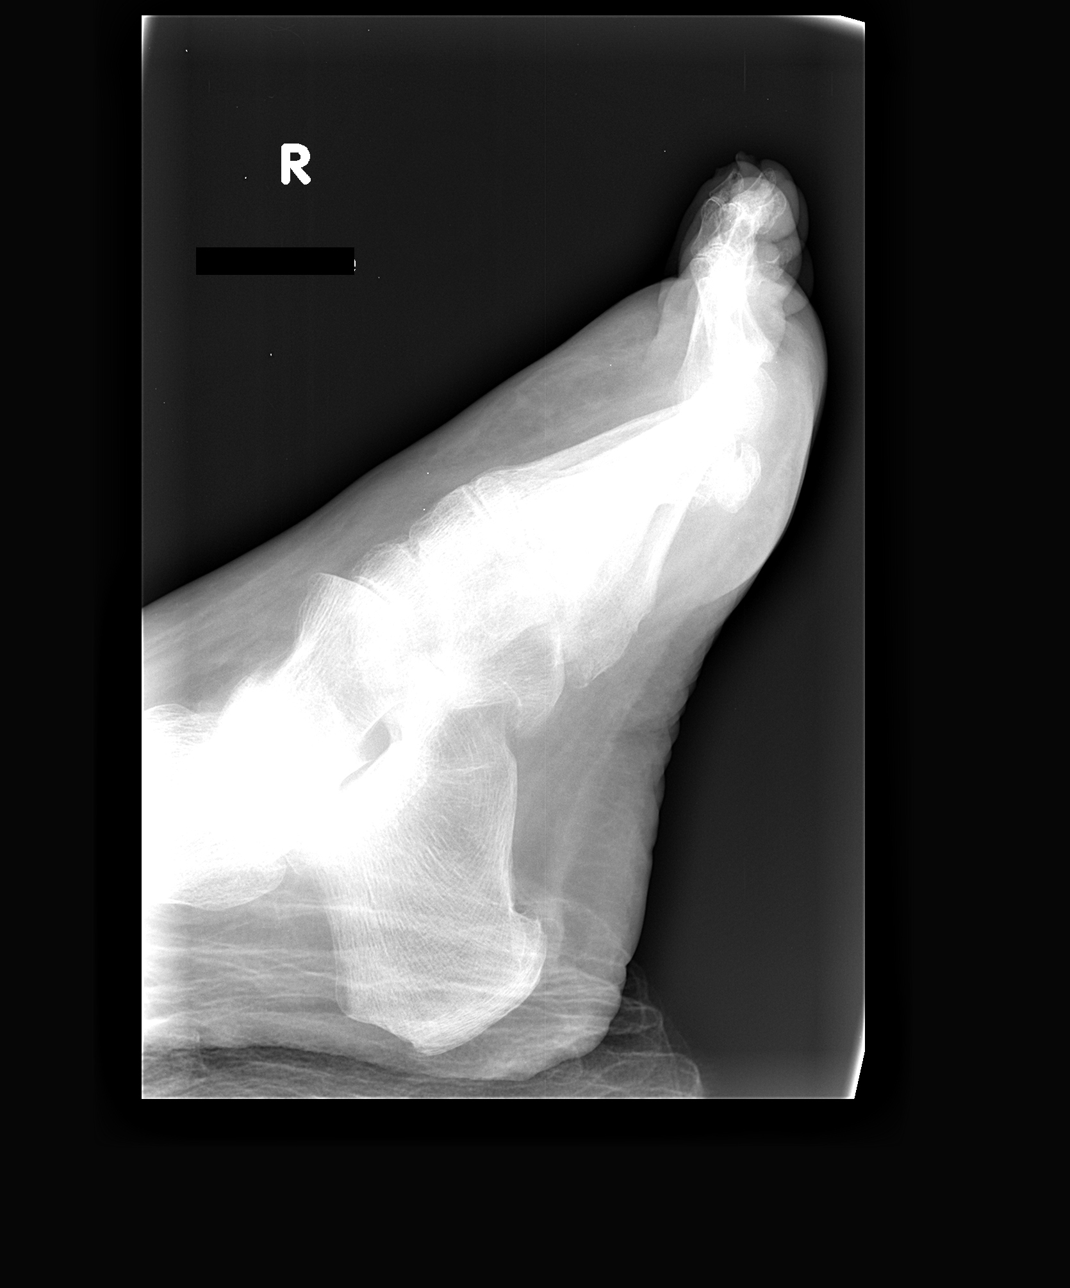

[3 of 3 positions shown; findings below may reference images not displayed]

FINDINGS: Right tibia/ fibula:

There are advanced degenerative changes noted at the knee. No acute
bony abnormality involving the tibia or fibula.

Right ankle:

The ankle mortise is maintained. No acute ankle fracture. No
significant degenerative changes. There is diffuse and fairly marked
soft tissue swelling.

Right foot:

The joint spaces are maintained. Mild degenerative changes are
noted. Osteoporotic changes. There is a nondisplaced transverse
fracture through the base of the fifth metatarsal. There is also a
small avulsion fracture.
IMPRESSION: Fifth metatarsal base fractures.

No fractures of the ankle, tibia or fibula are identified.

## 2015-07-05 IMAGING — CR DG TIBIA/FIBULA 2V*R*
4 series · 4 of 4 positions shown · non-contrast
Comparison: None.

CLINICAL DATA: Right lower extremity pain.

EXAM:
RIGHT TIBIA AND FIBULA - 2 VIEW; RIGHT ANKLE - COMPLETE 3+ VIEW;
RIGHT FOOT COMPLETE - 3+ VIEW

[view not recorded (1 of 4)]
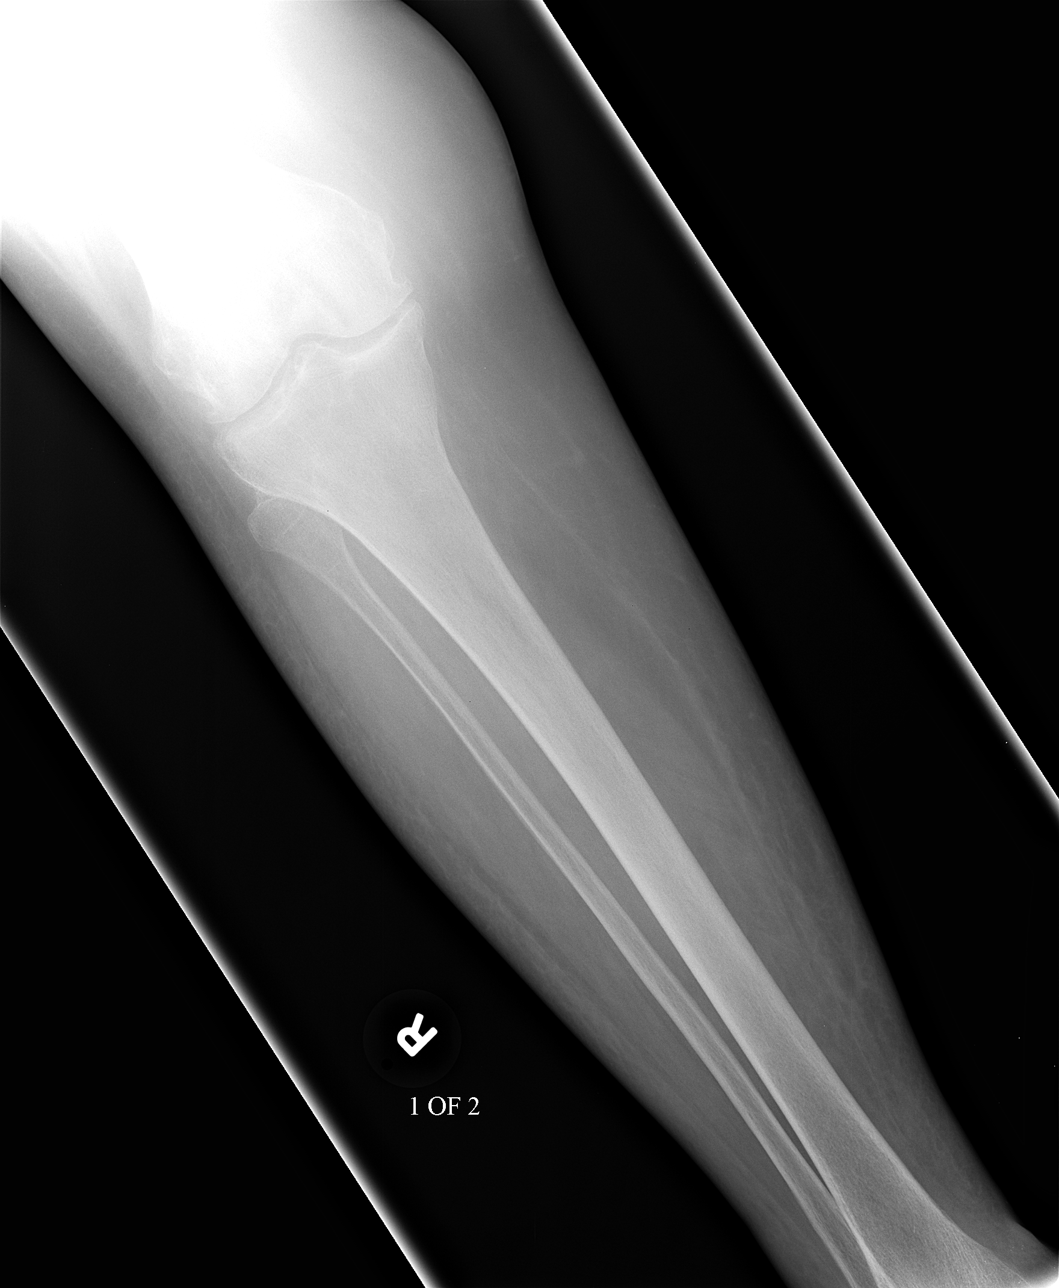

[view not recorded (2 of 4)]
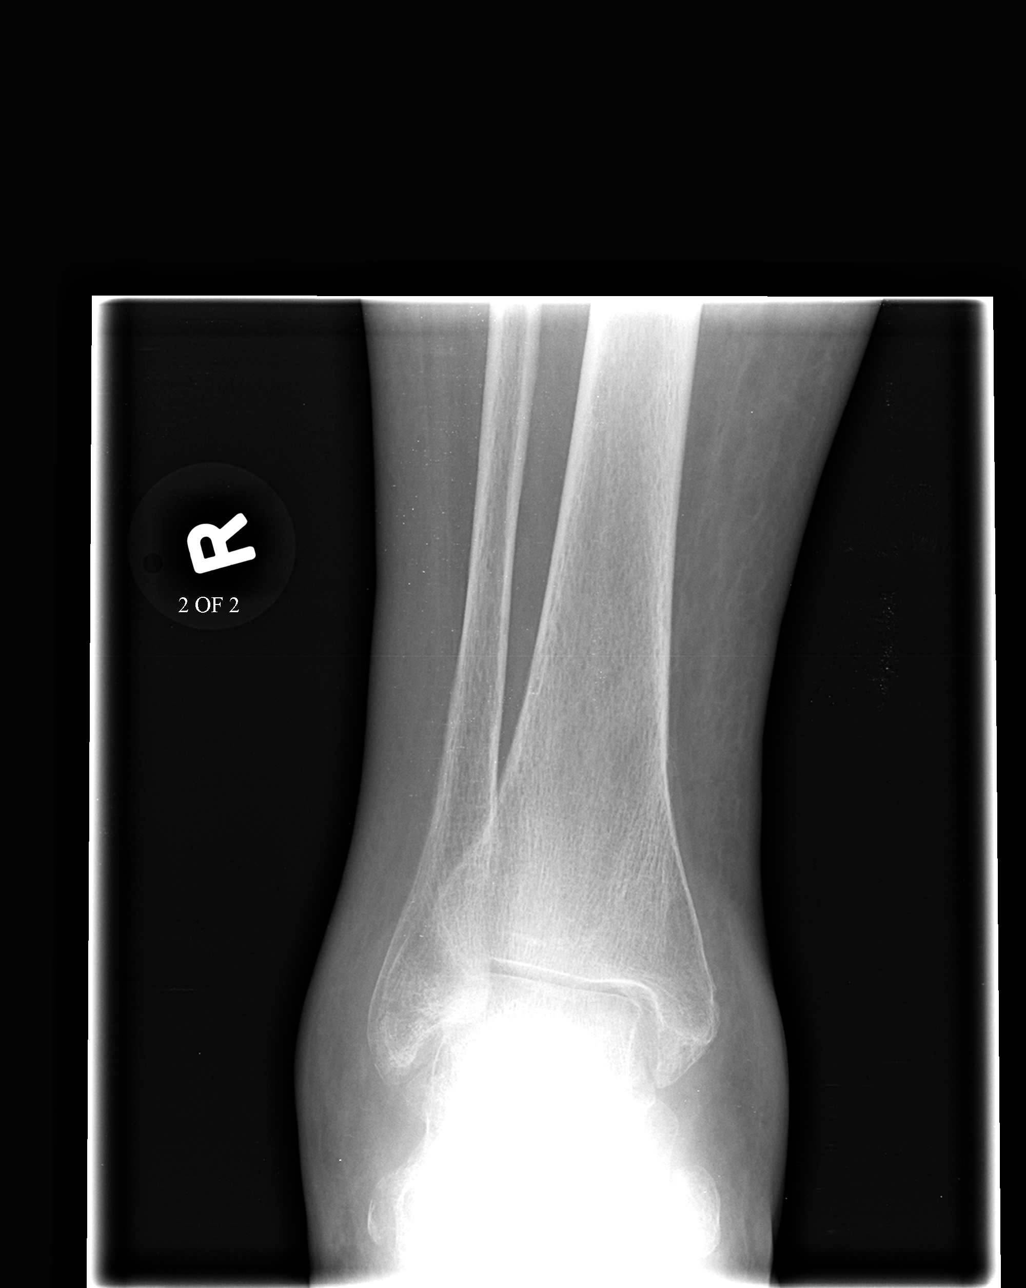

[view not recorded (3 of 4)]
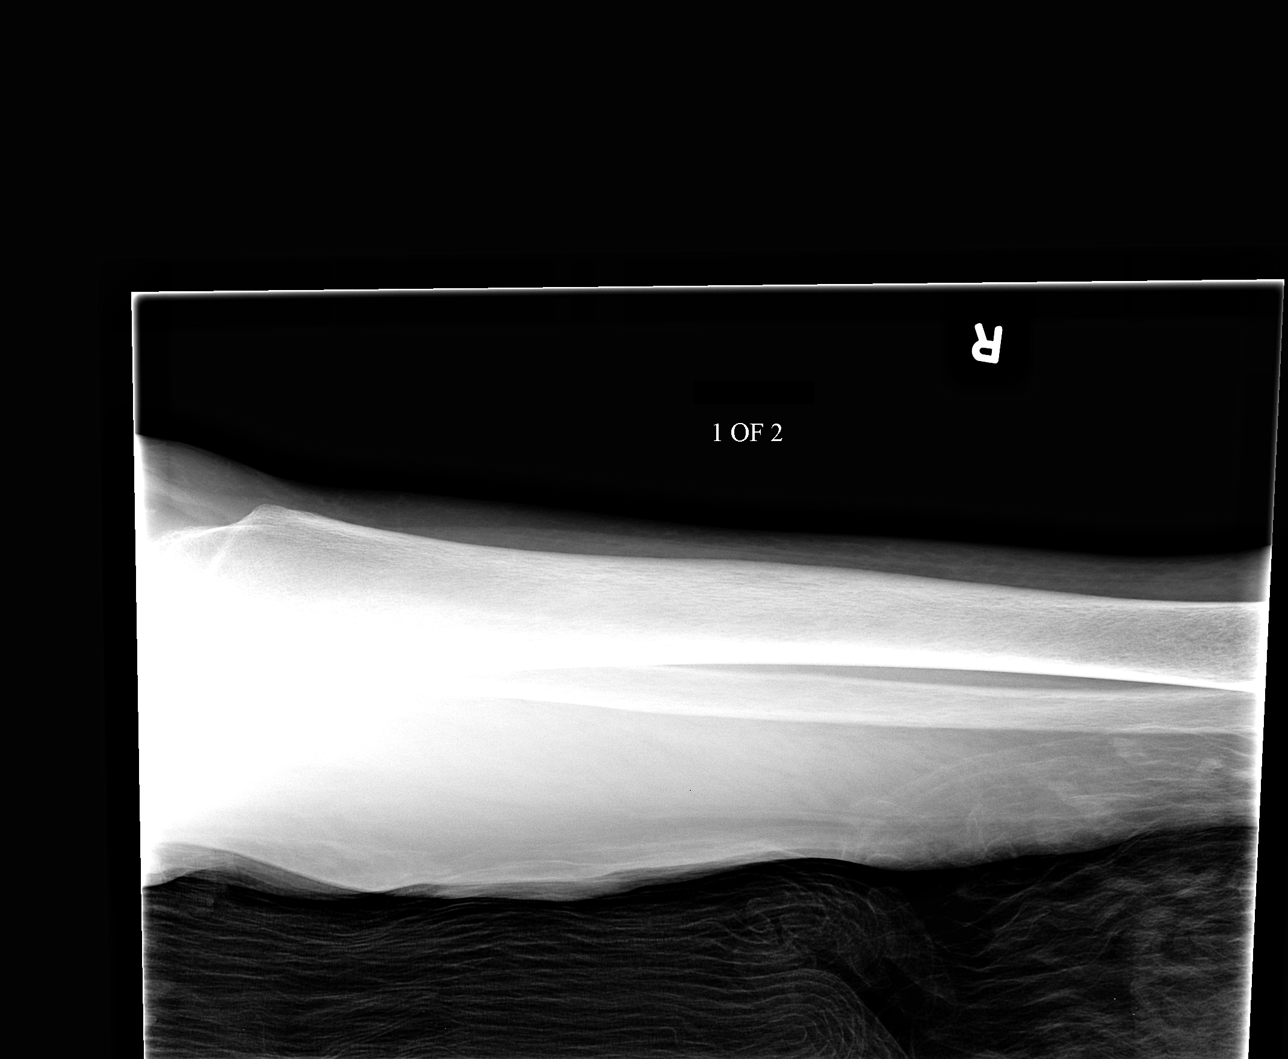

[view not recorded (4 of 4)]
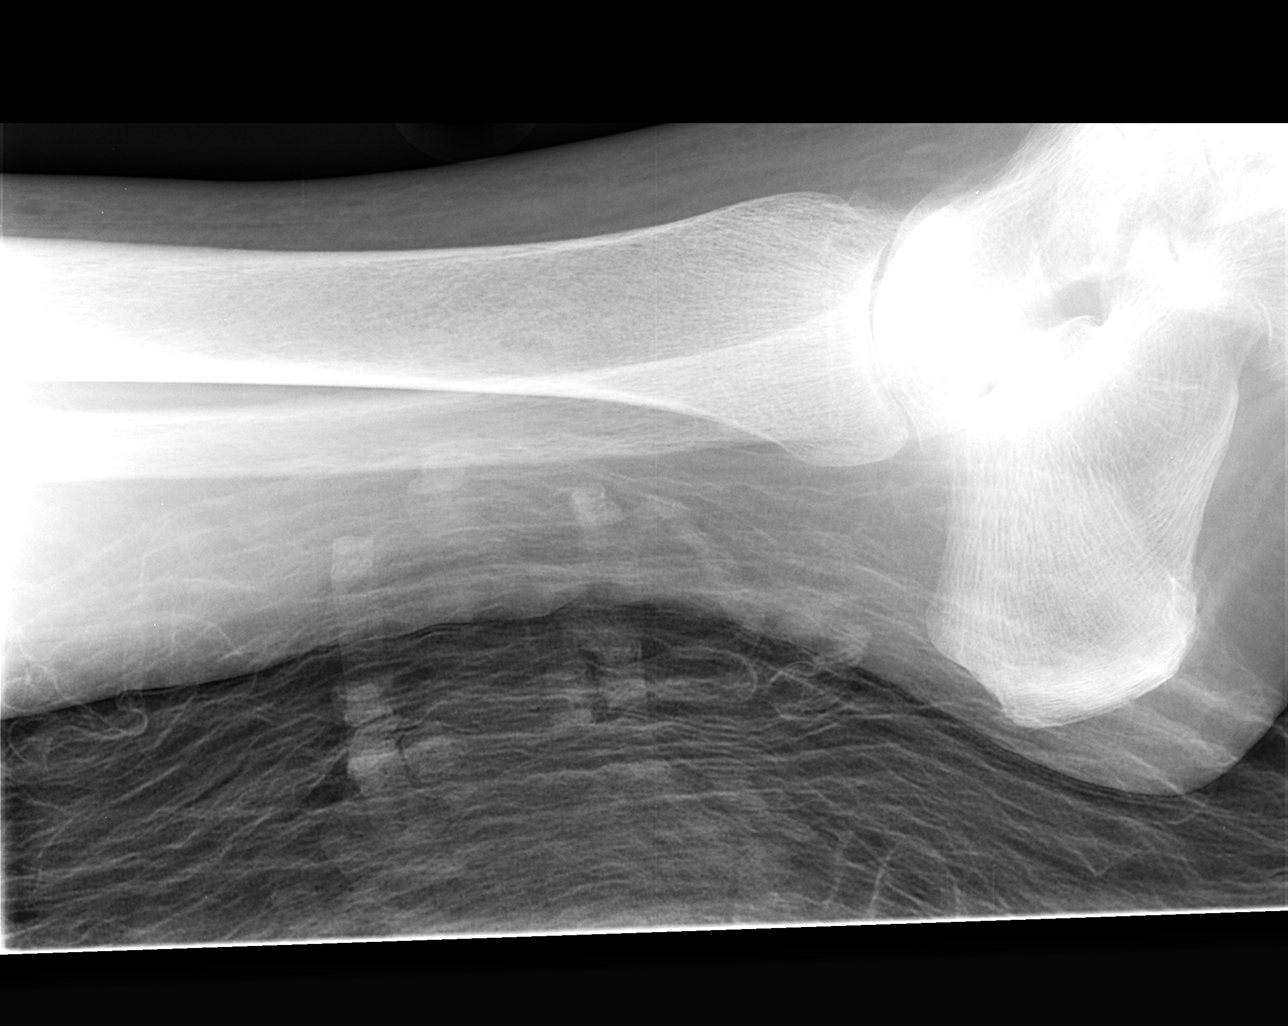

[4 of 4 positions shown; findings below may reference images not displayed]

FINDINGS: Right tibia/ fibula:

There are advanced degenerative changes noted at the knee. No acute
bony abnormality involving the tibia or fibula.

Right ankle:

The ankle mortise is maintained. No acute ankle fracture. No
significant degenerative changes. There is diffuse and fairly marked
soft tissue swelling.

Right foot:

The joint spaces are maintained. Mild degenerative changes are
noted. Osteoporotic changes. There is a nondisplaced transverse
fracture through the base of the fifth metatarsal. There is also a
small avulsion fracture.
IMPRESSION: Fifth metatarsal base fractures.

No fractures of the ankle, tibia or fibula are identified.

## 2015-07-06 NOTE — Progress Notes (Addendum)
Patient ID: Robin Blackburn, female   DOB: [birth year], 70 y.o.   MRN: [id number]                PROGRESS NOTE  DATE:  07/01/2015          FACILITY: [facility]                    LEVEL OF CARE:   SNF   Acute Visit             CHIEF COMPLAINT:  Left ankle pain.       HISTORY OF PRESENT ILLNESS:  This is a patient who is now a longstanding patient.  She came into this building in September 2015 after having fallen with pain in her right foot and ankle.  She was noted at the time to have an acute infarction in the left thalamus.  This initially presented with altered sensation in her right face, right leg and arm.    As noted, she has a history of rheumatoid arthritis, also fibromyalgia.   She is on Plaquenil 200 daily.  She states that her rheumatologist was Dr. Estanislado Pandy in [place].  Complicating this, she does not tolerate prednisone in any dose, did not tolerate Enbrel and/or methotrexate.  She continues to complain of widespread pain involving her hands arms legs and feet. States she does have morning stiffness  PAST MEDICAL HISTORY/PROBLEM LIST Past Medical History  Diagnosis Date  . Neuropathy ([place])   . Hypertension   . Anxiety   . Depression   . Acid reflux   . Rheumatoid arthritis ([place])   . Fibromyalgia   . Stroke ([place]) 02/17/14  . CVA (cerebral infarction) 02/19/2014    Acute left thalamic      CURRENT MEDICATIONS:  Medication list is reviewed.                 Simethicone 80 b.i.d.      Colace 100 mg p.o. p.r.n.       Voltaren gel 3 g to large joints.    Clarinex 5 daily.    Vitamin D 6000 daily for eight weeks, starting on 04/11/2015.    Hydrochlorothiazide 12.5 q.d.       Plavix 75 q.d.      KCl 20 q.d.       Restasis ophthalmic.    Hydrocodone 7.5/3.5, 1 tablet every 4 hours p.r.n.       Robaxin 750 q.6 hours p.r.n.        Xanax 1 mg three times a day.    Paxil 20 q.d.       Ambien 5 mg q.h.s. p.r.n.    Tylenol 650 at bedtime p.r.n.       Plaquenil 200 daily.        Prevacid 30 q.d.      Benadryl 12.5 q.d.       REVIEW OF SYSTEMS:    MUSCULOSKELETAL:   She complains of pain in the left foot and ankle.  Reminds me that she has trauma to that foot, as well.  No clear neck pain  RADIOLOGY:  X-ray of the left foot on 03/31/2015 showed no acute osseous abnormalities, mild degenerative changes at the first MTP joint.    Left knee x-ray on 03/31/2015 showed no acute osseous abnormalities, mild osteoarthritis.    PHYSICAL EXAMINATION:   MUSCULOSKELETAL:  Not a lot of classic trigger points EXTREMITIES:   BILATERAL LOWER EXTREMITIES:  The patient has some warmth  of the left ankle, left knee.  Perhaps some mild effusion in the left knee as well as the right knee, although the left knee is warm.   BILATERAL UPPER EXTREMITIES:  She has point tenderness in both wrists, but no major effusion.  The rest of her joints appear to be normal.    ASSESSMENT/PLAN:                          Rheumatoid arthritis.  She is on Plaquenil at 200 mg daily.  Recent x-rays do not show very much.  I think a review of her serology and inflammatory markers is in orders. .  I could increase her Plaquenil.  She is only currently on 200 mg, but I am really not feeling pressed to do that now.  I think a return to see her rheumatologist is probably in order no matter what the labs show. A lot of this could be fibromyalgia however even this doesn't have a lot of classic triger points

## 2015-07-07 ENCOUNTER — Non-Acute Institutional Stay (SKILLED_NURSING_FACILITY): Payer: Medicare Other | Admitting: Internal Medicine

## 2015-07-07 DIAGNOSIS — M06039 Rheumatoid arthritis without rheumatoid factor, unspecified wrist: Secondary | ICD-10-CM

## 2015-07-07 DIAGNOSIS — G894 Chronic pain syndrome: Secondary | ICD-10-CM | POA: Diagnosis not present

## 2015-07-08 ENCOUNTER — Emergency Department (HOSPITAL_COMMUNITY): Payer: Medicare Other

## 2015-07-08 ENCOUNTER — Inpatient Hospital Stay
Admission: RE | Admit: 2015-07-08 | Discharge: 2015-12-17 | Disposition: A | Discharge status: Nursing Home (Includes ICF) | Payer: Medicare Other | Source: Ambulatory Visit | Attending: Internal Medicine | Admitting: Internal Medicine

## 2015-07-08 ENCOUNTER — Emergency Department (HOSPITAL_COMMUNITY)
Admission: EM | Admit: 2015-07-08 | Discharge: 2015-07-08 | Disposition: A | Discharge status: Home / Self Care | Payer: Medicare Other | Attending: Emergency Medicine | Admitting: Emergency Medicine

## 2015-07-08 ENCOUNTER — Other Ambulatory Visit (HOSPITAL_COMMUNITY)
Admission: AD | Admit: 2015-07-08 | Discharge: 2015-07-08 | Disposition: A | Discharge status: Home / Self Care | Payer: Medicare Other | Source: Skilled Nursing Facility | Attending: Internal Medicine | Admitting: Internal Medicine

## 2015-07-08 ENCOUNTER — Encounter (HOSPITAL_COMMUNITY): Payer: Self-pay

## 2015-07-08 DIAGNOSIS — K219 Gastro-esophageal reflux disease without esophagitis: Secondary | ICD-10-CM | POA: Insufficient documentation

## 2015-07-08 DIAGNOSIS — S79912A Unspecified injury of left hip, initial encounter: Secondary | ICD-10-CM | POA: Diagnosis not present

## 2015-07-08 DIAGNOSIS — I10 Essential (primary) hypertension: Secondary | ICD-10-CM | POA: Insufficient documentation

## 2015-07-08 DIAGNOSIS — W19XXXA Unspecified fall, initial encounter: Secondary | ICD-10-CM

## 2015-07-08 DIAGNOSIS — S39012A Strain of muscle, fascia and tendon of lower back, initial encounter: Secondary | ICD-10-CM | POA: Diagnosis not present

## 2015-07-08 DIAGNOSIS — W06XXXA Fall from bed, initial encounter: Secondary | ICD-10-CM | POA: Insufficient documentation

## 2015-07-08 DIAGNOSIS — S3992XA Unspecified injury of lower back, initial encounter: Secondary | ICD-10-CM | POA: Diagnosis present

## 2015-07-08 DIAGNOSIS — S199XXA Unspecified injury of neck, initial encounter: Secondary | ICD-10-CM | POA: Diagnosis not present

## 2015-07-08 DIAGNOSIS — M069 Rheumatoid arthritis, unspecified: Secondary | ICD-10-CM | POA: Diagnosis not present

## 2015-07-08 DIAGNOSIS — G629 Polyneuropathy, unspecified: Secondary | ICD-10-CM | POA: Diagnosis not present

## 2015-07-08 DIAGNOSIS — Y92128 Other place in nursing home as the place of occurrence of the external cause: Secondary | ICD-10-CM | POA: Insufficient documentation

## 2015-07-08 DIAGNOSIS — Z79899 Other long term (current) drug therapy: Secondary | ICD-10-CM | POA: Insufficient documentation

## 2015-07-08 DIAGNOSIS — F419 Anxiety disorder, unspecified: Secondary | ICD-10-CM | POA: Insufficient documentation

## 2015-07-08 DIAGNOSIS — Z7951 Long term (current) use of inhaled steroids: Secondary | ICD-10-CM | POA: Insufficient documentation

## 2015-07-08 DIAGNOSIS — Z7902 Long term (current) use of antithrombotics/antiplatelets: Secondary | ICD-10-CM | POA: Diagnosis not present

## 2015-07-08 DIAGNOSIS — F329 Major depressive disorder, single episode, unspecified: Secondary | ICD-10-CM | POA: Insufficient documentation

## 2015-07-08 DIAGNOSIS — Z8673 Personal history of transient ischemic attack (TIA), and cerebral infarction without residual deficits: Secondary | ICD-10-CM | POA: Diagnosis not present

## 2015-07-08 DIAGNOSIS — Z880 Allergy status to penicillin: Secondary | ICD-10-CM | POA: Diagnosis not present

## 2015-07-08 DIAGNOSIS — Y998 Other external cause status: Secondary | ICD-10-CM | POA: Diagnosis not present

## 2015-07-08 DIAGNOSIS — S0990XA Unspecified injury of head, initial encounter: Secondary | ICD-10-CM | POA: Diagnosis not present

## 2015-07-08 DIAGNOSIS — Y9389 Activity, other specified: Secondary | ICD-10-CM | POA: Insufficient documentation

## 2015-07-08 DIAGNOSIS — M797 Fibromyalgia: Secondary | ICD-10-CM | POA: Insufficient documentation

## 2015-07-08 DIAGNOSIS — Z791 Long term (current) use of non-steroidal anti-inflammatories (NSAID): Secondary | ICD-10-CM | POA: Insufficient documentation

## 2015-07-08 DIAGNOSIS — Z9889 Other specified postprocedural states: Secondary | ICD-10-CM | POA: Insufficient documentation

## 2015-07-08 DIAGNOSIS — M810 Age-related osteoporosis without current pathological fracture: Principal | ICD-10-CM

## 2015-07-08 LAB — URINALYSIS, ROUTINE W REFLEX MICROSCOPIC
Bilirubin Urine: NEGATIVE
GLUCOSE, UA: NEGATIVE mg/dL
Ketones, ur: NEGATIVE mg/dL
LEUKOCYTES UA: NEGATIVE
NITRITE: NEGATIVE
PROTEIN: NEGATIVE mg/dL
Specific Gravity, Urine: 1.01 (ref 1.005–1.030)
pH: 8 (ref 5.0–8.0)

## 2015-07-08 LAB — URINE MICROSCOPIC-ADD ON

## 2015-07-08 MED ORDER — ONDANSETRON 4 MG PO TBDP
4.0000 mg | ORAL_TABLET | Freq: Once | ORAL | Status: AC
  Administered 2015-07-08: 4 mg via ORAL
  Filled 2015-07-08: qty 1

## 2015-07-08 MED ORDER — HYDROCODONE-ACETAMINOPHEN 5-325 MG PO TABS
1.0000 | ORAL_TABLET | Freq: Once | ORAL | Status: AC
  Administered 2015-07-08: 1 via ORAL
  Filled 2015-07-08: qty 1

## 2015-07-08 NOTE — Discharge Instructions (Signed)
Workup for the fall without any acute findings. CT of head and neck was negative. X-rays of the lumbar back pelvis and hips were negative. X-ray of the left shoulder was negative. May require some pain control for soreness and stiffness following the fall. Patient stable for discharge back to [facility].

## 2015-07-08 NOTE — ED Notes (Signed)
Pt also states pain in tailbone and left hip

## 2015-07-08 NOTE — ED Provider Notes (Signed)
CSN: [id number]     Arrival date & time 07/08/15  [id number] History   First MD Initiated Contact with Patient 07/08/15 0813     Chief Complaint  Patient presents with  . Fall     (Consider location/radiation/quality/duration/timing/severity/associated sxs/prior Treatment) Patient is a 70 y.o. female presenting with fall. The history is provided by the patient and the nursing home.  Fall Associated symptoms include headaches. Pertinent negatives include no chest pain, no abdominal pain and no shortness of breath.   patient from [facility]. Patient attempted to transfer from bed to wheelchair and chair rolled back. Patient landed on her buttocks and hit the top of her head. Complaining of top of head pain some mild neck tightness left hip pain and tailbone pain. No loss of consciousness.  Past Medical History  Diagnosis Date  . Neuropathy ([place])   . Hypertension   . Anxiety   . Depression   . Acid reflux   . Rheumatoid arthritis ([place])   . Fibromyalgia   . Stroke ([place]) 02/17/14  . CVA (cerebral infarction) 02/19/2014    Acute left thalamic   Past Surgical History  Procedure Laterality Date  . Back surgery    . Knee surgery    . Ankle reconstruction    . Abdominal hysterectomy    . Appendectomy    . Cholecystectomy     Family History  Problem Relation Age of Onset  . Hypertension Father   . Hypertension Brother   . Transient ischemic attack Father   . CVA Maternal Grandfather   . Leukemia Brother    Social History  Substance Use Topics  . Smoking status: Never Smoker   . Smokeless tobacco: None  . Alcohol Use: No   OB History    No data available     Review of Systems  Constitutional: Negative for fever.  HENT: Negative for congestion.   Eyes: Negative for visual disturbance.  Respiratory: Negative for shortness of breath.   Cardiovascular: Negative for chest pain.  Gastrointestinal: Negative for abdominal pain.  Genitourinary: Negative for dysuria.   Musculoskeletal: Positive for neck pain. Negative for back pain.  Skin: Negative for wound.  Neurological: Positive for headaches.  Hematological: Does not bruise/bleed easily.  Psychiatric/Behavioral: Negative for confusion.      Allergies  Demerol; Hydromorphone; Ciprofloxacin; Iohexol; Levaquin; Penicillins; Percocet; Sulfa antibiotics; Terramycin; and Morphine and related  Home Medications   Prior to Admission medications   Medication Sig Start Date End Date Taking? Authorizing Provider  dicyclomine (BENTYL) 20 MG tablet Take 20 mg by mouth every 6 (six) hours as needed. Abdominal cramps 01/31/14  Yes Historical Provider, MD  hydrocortisone (ANUSOL-HC) 2.5 % rectal cream Place 1 application rectally daily as needed for hemorrhoids or itching.   Yes Historical Provider, MD  zolpidem (AMBIEN CR) 12.5 MG CR tablet Take 12.5 mg by mouth at bedtime as needed for sleep.   Yes Historical Provider, MD  acetaminophen (TYLENOL) 325 MG tablet Take 650 mg by mouth every 6 (six) hours as needed for mild pain or moderate pain.    Historical Provider, MD  ALPRAZolam Duanne Moron) 1 MG tablet Take 1 tablet (1 mg total) by mouth every 6 (six) hours as needed. anxiety Patient taking differently: Take 1 mg by mouth 3 (three) times daily. anxiety 05/26/14   Erline Hau, MD  calcium-vitamin D (OSCAL WITH D) 500-200 MG-UNIT tablet Take 1 tablet by mouth 2 (two) times daily.    Historical Provider, MD  clopidogrel (  PLAVIX) 75 MG tablet Take 75 mg by mouth at bedtime. 05/03/14   Historical Provider, MD  desloratadine (CLARINEX) 5 MG tablet Take 5 mg by mouth daily.    Historical Provider, MD  diclofenac sodium (VOLTAREN) 1 % GEL Apply 3 g topically 3 (three) times daily. Apply to large joints as directed    Historical Provider, MD  docusate sodium (COLACE) 100 MG capsule Take 100 mg by mouth daily as needed for mild constipation.    Historical Provider, MD  doxycycline (VIBRAMYCIN) 100 MG capsule  Take 1 capsule (100 mg total) by mouth 2 (two) times daily. One po bid x 7 days 04/10/15   Merrily Pew, MD  fluticasone [facility]) 50 MCG/ACT nasal spray Place 1 spray into both nostrils 2 (two) times daily.    Historical Provider, MD  hydrochlorothiazide (MICROZIDE) 12.5 MG capsule Take 25 mg by mouth daily.  01/31/14   Historical Provider, MD  HYDROcodone-acetaminophen (NORCO) 7.5-325 MG tablet Take one tablet by mouth four times daily as needed for pain DO NOT EXCEED 3 GM OF APAP/24 HOURS FROM ALL SOURCES 06/08/15   Tiffany L Reed, DO  hydroxychloroquine (PLAQUENIL) 200 MG tablet Take 200 mg by mouth daily.     Historical Provider, MD  lansoprazole (PREVACID) 30 MG capsule Take 30 mg by mouth 2 (two) times daily. 02/17/14   Historical Provider, MD  loperamide (IMODIUM A-D) 2 MG tablet Take 2-4 mg by mouth 4 (four) times daily as needed for diarrhea or loose stools.    Historical Provider, MD  magic mouthwash SOLN Take 5 mLs by mouth every 6 (six) hours as needed for mouth pain.    Historical Provider, MD  Menthol, Topical Analgesic, (BIOFREEZE EX) Apply 1 application topically 2 (two) times daily.    Historical Provider, MD  methocarbamol (ROBAXIN) 500 MG tablet Take 1 tablet (500 mg total) by mouth every 6 (six) hours as needed for muscle spasms. Patient taking differently: Take 750 mg by mouth every 6 (six) hours as needed for muscle spasms.  02/22/14   Rexene Alberts, MD  ondansetron (ZOFRAN ODT) 4 MG disintegrating tablet 4mg  ODT q4 hours prn nausea/vomit 04/10/15   Merrily Pew, MD  PARoxetine (PAXIL) 20 MG tablet Take 20 mg by mouth daily.  05/03/14   Historical Provider, MD  potassium chloride 20 MEQ TBCR Take 20 mEq by mouth daily. 02/22/14   Rexene Alberts, MD  Propylene Glycol 0.6 % SOLN Apply 1 drop to eye daily.    Historical Provider, MD  RESTASIS 0.05 % ophthalmic emulsion Place 1 drop into both eyes 2 (two) times daily. 01/31/14   Historical Provider, MD  sodium chloride (OCEAN) 0.65 % SOLN  nasal spray Place 1 spray into both nostrils as needed for congestion. 02/22/14   Rexene Alberts, MD   BP 136/94 mmHg  Pulse 85  Temp(Src) 97.7 F (36.5 C) (Oral)  Resp 18  SpO2 98% Physical Exam  Constitutional: She is oriented to person, place, and time. She appears well-developed and well-nourished. No distress.  HENT:  Head: Normocephalic and atraumatic.  Mouth/Throat: Oropharynx is clear and moist.  Eyes: Conjunctivae and EOM are normal. Pupils are equal, round, and reactive to light.  Neck: Normal range of motion.  Mild stiffness with range of motion  Cardiovascular: Normal rate, regular rhythm and normal heart sounds.   No murmur heard. Pulmonary/Chest: Effort normal and breath sounds normal. No respiratory distress.  Abdominal: Soft. Bowel sounds are normal. There is no tenderness.  Musculoskeletal: Normal range  of motion. She exhibits tenderness.  Mild tenderness to left hip.  Neurological: She is alert and oriented to person, place, and time. No cranial nerve deficit. She exhibits normal muscle tone. Coordination normal.  Skin: Skin is warm. No rash noted.  Nursing note and vitals reviewed.   ED Course  Procedures (including critical care time) Labs Review Labs Reviewed - No data to display  Imaging Review Dg Lumbar Spine Complete  07/08/2015  CLINICAL DATA:  Recent fall from wheelchair with low back pain, initial encounter EXAM: LUMBAR SPINE - COMPLETE 4+ VIEW COMPARISON:  06/24/2014 FINDINGS: Five lumbar type vertebral bodies are well visualized. A mild scoliosis concave to the right is seen. Multilevel osteophytic changes are noted. No compression deformities are noted. No anterolisthesis is seen. Multilevel disc space narrowing is noted. IMPRESSION: Degenerative change without acute abnormality. Electronically Signed   By: Inez Catalina M.D.   On: 07/08/2015 09:47   Ct Head Wo Contrast  07/08/2015  CLINICAL DATA:  Fall, trauma, head and neck injury EXAM: CT HEAD  WITHOUT CONTRAST CT CERVICAL SPINE WITHOUT CONTRAST TECHNIQUE: Multidetector CT imaging of the head and cervical spine was performed following the standard protocol without intravenous contrast. Multiplanar CT image reconstructions of the cervical spine were also generated. COMPARISON:  02/19/2014 FINDINGS: CT HEAD FINDINGS Stable brain atrophy and chronic white matter microvascular changes about the ventricles. Stable mild ventricular enlargement. No acute intracranial hemorrhage, mass lesion, definite infarction, midline shift, herniation, hydrocephalus, or extra-axial fluid collection. No focal mass effect or edema. Cisterns are patent. Cerebellar atrophy as well. Orbits are symmetric. Chronic right maxillary sinus disease with calcification. Other sinuses remain clear. No skull fracture. CT CERVICAL SPINE FINDINGS Stable alignment with minimal grade 1 anterolisthesis of C4 upon C5 measuring 3 mm. Diffuse cervical degenerative disc disease and spondylosis, most pronounced at C6-7 where there is marked disc space narrowing, sclerosis and osteophyte formation. Multilevel facet arthropathy noted throughout the cervical spine without subluxation or dislocation. Normal prevertebral soft tissues. Odontoid demonstrates degenerative changes but appears intact. No soft tissue asymmetry in the neck. Scarring in the lung apices. IMPRESSION: Stable brain atrophy and chronic white matter microvascular changes. No interval change or acute process by noncontrast CT. Moderately severe cervical spondylosis and degenerative change as above. No acute osseous finding or malalignment by CT. Electronically Signed   By: Jerilynn Mages.  Shick M.D.   On: 07/08/2015 09:54   Ct Cervical Spine Wo Contrast  07/08/2015  CLINICAL DATA:  Fall, trauma, head and neck injury EXAM: CT HEAD WITHOUT CONTRAST CT CERVICAL SPINE WITHOUT CONTRAST TECHNIQUE: Multidetector CT imaging of the head and cervical spine was performed following the standard protocol  without intravenous contrast. Multiplanar CT image reconstructions of the cervical spine were also generated. COMPARISON:  02/19/2014 FINDINGS: CT HEAD FINDINGS Stable brain atrophy and chronic white matter microvascular changes about the ventricles. Stable mild ventricular enlargement. No acute intracranial hemorrhage, mass lesion, definite infarction, midline shift, herniation, hydrocephalus, or extra-axial fluid collection. No focal mass effect or edema. Cisterns are patent. Cerebellar atrophy as well. Orbits are symmetric. Chronic right maxillary sinus disease with calcification. Other sinuses remain clear. No skull fracture. CT CERVICAL SPINE FINDINGS Stable alignment with minimal grade 1 anterolisthesis of C4 upon C5 measuring 3 mm. Diffuse cervical degenerative disc disease and spondylosis, most pronounced at C6-7 where there is marked disc space narrowing, sclerosis and osteophyte formation. Multilevel facet arthropathy noted throughout the cervical spine without subluxation or dislocation. Normal prevertebral soft tissues. Odontoid demonstrates degenerative changes  but appears intact. No soft tissue asymmetry in the neck. Scarring in the lung apices. IMPRESSION: Stable brain atrophy and chronic white matter microvascular changes. No interval change or acute process by noncontrast CT. Moderately severe cervical spondylosis and degenerative change as above. No acute osseous finding or malalignment by CT. Electronically Signed   By: Jerilynn Mages.  Shick M.D.   On: 07/08/2015 09:54   Dg Shoulder Left  07/08/2015  CLINICAL DATA:  [place] to floor.  Left shoulder pain. EXAM: LEFT SHOULDER - 2+ VIEW COMPARISON:  None. FINDINGS: Mild degenerative changes in the left shoulder. No acute bony abnormality. Specifically, no fracture, subluxation, or dislocation. Soft tissues are intact. Irregularity noted along the medial humeral head felt to be degenerative in nature. IMPRESSION: No acute bony abnormality. Electronically Signed    By: Rolm Baptise M.D.   On: 07/08/2015 09:43   Dg Hips Bilat With Pelvis 3-4 Views  07/08/2015  CLINICAL DATA:  Fall getting into wheelchair. Low back pain, bilateral hip pain. EXAM: DG HIP (WITH OR WITHOUT PELVIS) 3-4V BILAT COMPARISON:  None. FINDINGS: Mild degenerative changes in the hips bilaterally. No acute bony abnormality. Specifically, no fracture, subluxation, or dislocation. Soft tissues are intact. IMPRESSION: No acute bony abnormality. Electronically Signed   By: Rolm Baptise M.D.   On: 07/08/2015 09:53   I have personally reviewed and evaluated these images and lab results as part of my medical decision-making.   EKG Interpretation None      MDM   Final diagnoses:  Fall, initial encounter  Head injury, initial encounter  Lumbar strain, initial encounter   Patient status post fall at [facility]. Workup here to include CT head neck, x-ray of lumbar back left shoulder both hips and pelvis all negative. No evidence of any acute bony injury. Patient stable for discharge back to [facility].    Fredia Sorrow, MD 07/08/15 1039

## 2015-07-08 NOTE — ED Notes (Signed)
Patient in from [facility]. Attempted to transfer from bed to wheelchair and chair rolled back. Pt landed on buttocks and hit head. Complaining of pain in back of head. No loss of consciousness

## 2015-07-10 LAB — URINE CULTURE

## 2015-07-11 NOTE — Progress Notes (Signed)
Patient ID: Robin Blackburn, female   DOB: [birth year], 70 y.o.   MRN: [id number]               PROGRESS NOTE  DATE:  07/07/2015          FACILITY: [facility]                     LEVEL OF CARE:   SNF   Acute Visit              CHIEF COMPLAINT:  Follow up generalized complaints of pain.      HISTORY OF PRESENT ILLNESS:  Robin Blackburn is a patient who came here in September 2015.  At that point, she had suffered an acute thalamic infarct.    She has a history of rheumatoid arthritis, on hydroxychloroquine, fibromyalgia, and also reflex sympathetic dystrophy.  She has really severe chronic pain.  She tells me that she has pain all night long which makes it difficult for her to sleep.  She wakes up with morning stiffness.    I saw her last week for these same complaints.  I went over her markers again.  Her rheumatoid factor was less than 10.  CCP antibodies were 9.  C-reactive protein was slightly elevated at 1.4.  Sed rate was 18.  Her recently discovered 25-hydroxy vitamin D was 22.2.  She is on vitamin D3, 6000 U a day for eight weeks.    REVIEW OF SYSTEMS:    MUSCULOSKELETAL:  The patient complains of widespread pain, which is worse with activity but also keeps her awake at night.   She does have morning stiffness.  Her major complaint currently is in the left knee, radiating down to the left foot.    PHYSICAL EXAMINATION:   MUSCULOSKELETAL:   EXTREMITIES:  She does not have any obvious acute joints.  However, she continually has point tenderness suggestive of synovitis in both wrists.  She has degenerative changes in both knees.  She does have pain across the metatarsal heads bilaterally.  Tenderness in the left shoulder greater than the right.  She also has pain over her deltoids, over her latissimus dorsi, the teres group of muscles, and she has tenderness in these areas. This would suggest that the fibromyalgia diagnosis is correct.    ASSESSMENT/PLAN:                 Rheumatoid arthritis +/- fibromyalgia.  She has an appointment with her rheumatologist, Dr. Estanislado Pandy, on Tuesday.  The patient asked me to give her a prednisone taper.  I will not do this until the rheumatologist sees her.  She is on hydroxychloroquine.  This does not seem to be controlling her symptoms.  All of her inflammatory markers are negative except for a slightly elevated CRP.     CPT CODE: 16109

## 2015-07-17 ENCOUNTER — Non-Acute Institutional Stay (SKILLED_NURSING_FACILITY): Payer: Medicare Other | Admitting: Internal Medicine

## 2015-07-17 DIAGNOSIS — G894 Chronic pain syndrome: Secondary | ICD-10-CM | POA: Diagnosis not present

## 2015-07-17 DIAGNOSIS — M069 Rheumatoid arthritis, unspecified: Secondary | ICD-10-CM | POA: Diagnosis not present

## 2015-07-17 DIAGNOSIS — R21 Rash and other nonspecific skin eruption: Secondary | ICD-10-CM | POA: Diagnosis not present

## 2015-07-17 DIAGNOSIS — Z8673 Personal history of transient ischemic attack (TIA), and cerebral infarction without residual deficits: Secondary | ICD-10-CM

## 2015-07-17 DIAGNOSIS — J0111 Acute recurrent frontal sinusitis: Secondary | ICD-10-CM | POA: Diagnosis not present

## 2015-07-17 NOTE — Progress Notes (Signed)
Patient ID: Robin Blackburn, female   DOB: 1946/02/24, 70 y.o.   MRN: [id number]                  .   . This is a routine   visit  ACILITY: [facility]:   SNF   CHIEF COMPLAINT: Medical management of chronic medical conditions including bilateral metatarsal fractures-hypertension-depression-anxiety-chronic pain-rheumatoid arthritis-history CVA-- Acute visit secondary to complaints of head congestion sinus issues      HISTORY OF PRESENT ILLNESS:  This is a patient whom we actually had in the building from September to mid October.  At that point, she had suffered an acute thalamic infarct and, after her arrival here, we discovered her to have a fracture of the right fifth metatarsal for which she was referred to Orthopedics.  She was discharged to her own apartment which apparently has four stairs to enter.         She was discovered during this admission to have a minimally displaced fracture of the left fifth metatarsal and, as noted, has the original fracture that we diagnosed about a year ago---.  These issues have been relatively stable and followed by orthopedics--in fact she is now able to wear tennis shoes this appears to be quite stable which is encouraging--we did x-ray her left foot and ankle area recently secondary complaints of increased pain however did not really show any acute changes      She does have a history of chronic pain and muscle discomfort-she is receiving Vicodin-7.5 325 every 4 hours when necessary- She is also on Robaxin  increased this to 7  50 mg every 6 hours when necessary-this appears to be helping some--although she continues to complain of pain at times-   Ishe has seen her rheumatologist  in the past who has prescribed  Plaqueni--and she has been taking this as well recently  Dr. Dellia Nims has recently seen her and is waiting rheumatology input-per his assessment he feels this may be fibromyalgia versus  rheumatoid arthritis-he did order several markers that all essentially came back negative or minimally elevated      Today she says she does not feel well feels she's having sinusitis again with head congestion and stuffiness.  She  at times has received azithromycin for suspected bronchitis sinusitis-she is quite adamant she needs this again-  She did go to the ER recently after sustaining a fall CT of her head and neck and x-rays of her spine were negative for any acute process hips and pelvis all were negative as well-she is complaining somewhat of the left knee pain today       Was also noted about 3 months ago she did have a guaiac positive stool  others have been negative-she also has a history of hemorrhoids was thought possibly this may have been a bleeding hemorrhoid which contributed to the one positive reading-her hemoglobin actually stable to somewhat improved-she is on a proton pump inhibitor I did discuss this  and she does not at this point want any GI workup-she feels this was a bleeding hemorrhoid and does not want any workup for now---     Patient does have a history of anxiety she is on Xanax as needed this appears to be relatively stable.  In regards to depression she is on Paxil which appears to be stable as well.  She does have a history of hypertension she is on Microzide recent blood pressures  appear to be relatively stable--I see a recent systolic in the 888B but this does not appear to be the norm-I suspect sometimes a smaller cuffis used which leads to inaccurate readings at this point will monitor   She  also complained of some bilateral hearing loss she feels this has been essentially since she had her CVA- We did give her a course of De Brox for some earwax issues-however during loss persisted and she has actually seen an ENT who actually manually disimpacted some earwax --       She was seen late last year  for some increased erythema of her left  arm-did go to the ER-she was started on doxycycline a venous Doppler was negative for any DVT-this resolved quite unremarkably  Per staff she has developed a left groin rash she has complained apparently of some itching .     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      Fracture of the right fifth metatarsal in September, now with fracture of the left fifth metatarsal.    Physical deconditioning.    Essential hypertension.    Neuropathy.     Hyponatremia.    Hypokalemia.    History of rheumatoid arthritis, fibromyalgia and reflex sympathetic dystrophy, according to the patient.  Hyperlipidemia.     CURRENT MEDICATIONS:  Medication list is reviewed.            Xanax 1 mg tablet every 6 hours p.r.n.     Plavix 75 q.d.       Bentyl 20 q.6 hours p.r.n., abdominal cramps.    Lomotil 1 tablet two times daily as needed for diarrhea or loose stool.     Microzide 12.5 daily.    Norco 7.5/3.5 q.4 daily.    Prevacid 30 mg b.i.d. daily.    Lidoderm patch to the right knee.    Robaxin 750 mg q.6 p.r.n.     Zofran q.6 p.r.n.     Paxil 10 q.d.    K-dur 20 q.d.    Pravachol 10 q.d.     Restasis ophthalmic 1 drop b.i.d.    Sodium chloride nasal spray 1 spray, each nostril, b.i.d.    Ambien 10 mg q.h.s. P.r.n  Plaquenil 200 mg QD    I note that aspirin and simvastatin were discontinued.    SOCIAL HISTORY:                HOUSING:  The patient lived in her own apartment.  There is apparently a four-stair entrance stairway that she needs to manage.   FUNCTIONAL STATUS:  She has in-home support which she pays for privately, according to her.    REVIEW OF SYSTEMS: In general does not complain of fever chills. --However says she does not feel well with headache and stuffiness think she has a sinus infection  Skin- Has a groin rash as noted above  Head ears eyes nose mouth and throat  Feel  she has some increased head congestion sinus headache and nasal drainage       CHEST/RESPIRATORY:  --Does not complain of shortness of breath symptoms appear to be more sinus related head congestion  CARDIAC:   No chest pain.     GI:   Is not complaining of nausea vomiting diarrhea constipation or abdominal discomfort today she has had times complain of some vomiting and diarrhea but that is not the  case today  GU does not complain of suprapubic discomfort or dysuria.   MUSCULOSKELETAL:  Somewhat chronic complaints of generalized pain--more so left knee   NeurologicSome previous history of complaints of neuropathic pain does not complain of numbness h or dizziness currently.--  Psych-does have a history of anxiety this point appears to be relatively stable   increased her Xanax back to 1 mg every 6 hours secondary to increased anxiety symptoms apparently this is helping.     PHYSICAL EXAMINATION:  Temperature 97.5 pulse 80 respirations 18 blood pressures 151/83 as noted above-weight is 213.2 s Gen.  pleasant elderly female in no distress    Skin is warm and dry.-- Left groin area has a somewhat confluent erythematous rash   Eyes pupils appear reactive light sclera and conjunctiva are clear visual acuity appears intact.    Oropharynx is clear mucous membranes moist--appears to have some increased clear drainage   Ears-tympanic membranes are visualized there is some moderate wax in ears bilaterally              CHEST/RESPIRATORY:  Clear air entry bilaterally. No labored breathing   CARDIOVASCULAR:   CARDIAC:   Heart sounds are normal rate and rhythm.  There are no murmurs.  She appears to be euvolemic--I would say minimal lower extremity edema.   GASTROINTESTINAL: Abdomen is obese soft does not appear to be overtly tender -bowel sounds are positive--  L  GENITOURINARY:  BLADDER:   I did not note any bladder distention or overt tenderness MUSCULOSKELETAL:   EXTREMITIES:     BILATERAL LOWER EXTREMITIES:    Moves both at baseline strength-she has minimal lower extremity edema  bilaterally  I do not note any deformities of the left foot there is some mild edema of the knees bilaterally   is complaining of some pain of her left knee with palpation I do not see any erythema or feel significant warmth   Strength upper extremities bilaterally appears grossly intact again possibly since mild right-sided weakness but this continues to be minimal  t-.  Neuro I could not really appreciate any overt lateralizing findings she does have a history of CVA her speech is clear cranial nerves appear intact. Does have a history of fairly mild right-sided weakness --minimal deficit      .    Psych-she is alert and oriented pleasant and appropriate.  Labs  Jan 23d 2017.  ESR 18.  C-reactive 1.4 mildly elevated.  Vitamin D 22.2.  06/03/2016  Sodium 131 potassium 4.1 BUN 7 creatinine 0.73.  WBC 11.4 hemoglobin 11.6 platelets 387   05/25/2015.  Sodium 132  05/24/2015.  Sodium 134 potassium 4.6 BUN 9 creatinine 0.74.    04/10/2015.  Sodium 129 potassium 3.7 BUN 9 creatinine 0.85.  ALT 11 otherwise liver function tests within normal limits.  WBC 14.0 hemoglobin 11.6 platelets 398   02/15/2015.  Sodium 132 potassium 4 BUN 7 creatinine 0.7 to.  WBC 8.9 hemoglobin 12.4 platelets 371.    02/12/2014-vitamin D level XXIV.5 this appears to be rising  January 18 2015.  Sodium 132 potassium 4.2 BUN 9 creatinine 0.68.  WBC 8.0 hemoglobin 12.2 platelets 380  12/20/2014.  Sodium 131 potassium 4.1 BUN 9 creatinine 0.71.  Calcium 8.8.  12/08/2014.  Vitamin D 22.6.  12/04/2014.  WBC 8.9 hemoglobin 11.1 platelets 377.  W in 3.1 AST 13-ALT 12 otherwise liver function tests within normal limits.  Cholesterol 183-triglycerides 269-HDL 47-LDL 82  11/07/2014.  Sodium 131 potassium 4.3 BUN  10 creatinine 0.77.  WBC 7.6 hemoglobin 11.4  platelets 358.  Sedimentation rate 12.  RA factor 7.0  .  10/09/2014.  WBC 8.4 hemoglobin 11.7 platelets 325.  Sodium 132 potassium 4.4 BUN 9 creatinine 0.78 CO2 32.  09/09/2014.  Liver function tests within normal limits.  07/29/2014-TSH within normal limits at 1.463.  Of note recent vitamin D level on April 14 was low at 17.6  08/21/2014.  Sodium 134 potassium 3.7 BUN 9 creatinine 0.78.  WBC 9.2 hemoglobin 11.2 platelets 386   08/07/2014.  WBC 9.7 hemoglobin 10.8 platelets 392.  Sodium 135 potassium 3.6 BUN 8 creatinine 0.82  06/28/2014.  WBC 8.5 hemoglobin 12.2 platelets 375.  Sodium 135 potassium 4.4 BUN 8 creatinine 0.8  02/20/2014  Magnesium level II.1.  Cholesterol 253 triglycerides 309H LDL 62-LDL  .     ASSESSMENT/PLAN:  History of suspected sinusitis headache nasal drainage-she has been on azithromycin in the past and says this has helped will start a short course of 5 days here she continues on Claritin has Flonase as well at this point monitor.-Plaquenil will have to be held while she is receiving azithromycin-apparently there also is an interaction with Zofran pharmacy will need be contacted about this   History of left groin rash will treat with Nizoral cream and monitor.  Complaints of left knee pain status post recent fall will order an x-ray here x-rays again of other affected areas were negative as well as CT scan of the head.    I                                                      History of allergic rhinitis                               This may be contributing to her symptoms today she is on Claritin also takes Flonase intermittently   History of guaiac-positive stool-this is a 1 time positive reading-her hemoglobin has been stable she is on a proton pump inhibitor--as noted above-- she does not want a GI workup at this time--will update her CBC    chronic pain muscle spasms -- rheumatoid arthritis-patient is on a  significant dose of Vicodin as noted above- We have increased her Robaxin 750 mg every 6 hours previously apparently this is helping some--Plaquenil has been restarted as well. .        In regards hyperlipidemia this appears stable with cholesterol 183 triglycerides 269 HDL 47 LDL of 82 on lab done 12/04/2014 at this point will monitor she prefers to be off the statin--will update lipid panel      anxiety Her Xanax rhas been increased back to 1 mg every 6 hours when necessary this appears to be helping     .    History of remote left thalamic CVA.  Very little residual of this she continues on Plavix--.        History of metatarsal fractures bilaterally-this is followed by orthopedics--appears to be stable now essentially wearing street shoes which is encouraging--recent x-ray of left foot did not really show any acute process--          Hyponatremia and hypokalemia.  The etiology here is unclear. But appears to be stable during her stay here --  sodium generally runs high 120s to low 130s --most recently   131 we will continue to do updates on this  History of rheumatoid arthritis, fibromyalgia and reflex sympathetic dystrophy, according to the patient.  She has chronic pain issues, medication changes as noted above-Plaquenil has been restarted--rheumatology consult is pending this has been evaluated by Dr. Dellia Nims as well                                                                                 Hypertension-at this point appears fairly stable  Appears readings with a larger cuff were satisfactory smaller cuff -- systolic appears to jump    GERD this appears stable on Prilosec she is no longer complaining symptoms with this will have to be monitored.         .    Depression-at this point is stableon Paxil .                             Insomnia she is on Ambien this was increased secondary patient requests saying the lower dose of Ambien was not  effective she appears to have tolerated this well although will have to be watched--      vitamin D deficiency-she had been started on supplementation-4 vitamin D level was 22.2 on lab done in late January will update a vitamin D level-she may need additional supplementation       [id number]- Of note greater than 35 minutes spent assessing patient-reviewing her chart-addressing her concerns -and coordinating and formulating a plan of care for numerous diagnoses-of note greater than 50% of time spent coordinating plan of care

## 2015-07-18 ENCOUNTER — Encounter (HOSPITAL_COMMUNITY)
Admission: AD | Admit: 2015-07-18 | Discharge: 2015-07-18 | Disposition: A | Discharge status: Home / Self Care | Payer: Medicare Other | Source: Skilled Nursing Facility | Attending: Internal Medicine | Admitting: Internal Medicine

## 2015-07-18 DIAGNOSIS — I10 Essential (primary) hypertension: Secondary | ICD-10-CM | POA: Insufficient documentation

## 2015-07-18 LAB — CBC
HCT: 35.8 % — ABNORMAL LOW (ref 36.0–46.0)
Hemoglobin: 11.8 g/dL — ABNORMAL LOW (ref 12.0–15.0)
MCH: 30.2 pg (ref 26.0–34.0)
MCHC: 33 g/dL (ref 30.0–36.0)
MCV: 91.6 fL (ref 78.0–100.0)
PLATELETS: 486 10*3/uL — AB (ref 150–400)
RBC: 3.91 MIL/uL (ref 3.87–5.11)
RDW: 13.2 % (ref 11.5–15.5)
WBC: 10.2 10*3/uL (ref 4.0–10.5)

## 2015-07-18 LAB — COMPREHENSIVE METABOLIC PANEL
ALK PHOS: 72 U/L (ref 38–126)
ALT: 12 U/L — AB (ref 14–54)
AST: 18 U/L (ref 15–41)
Albumin: 3.6 g/dL (ref 3.5–5.0)
Anion gap: 9 (ref 5–15)
BUN: 8 mg/dL (ref 6–20)
CALCIUM: 8.5 mg/dL — AB (ref 8.9–10.3)
CO2: 29 mmol/L (ref 22–32)
CREATININE: 0.75 mg/dL (ref 0.44–1.00)
Chloride: 93 mmol/L — ABNORMAL LOW (ref 101–111)
Glucose, Bld: 113 mg/dL — ABNORMAL HIGH (ref 65–99)
Potassium: 3.5 mmol/L (ref 3.5–5.1)
Sodium: 131 mmol/L — ABNORMAL LOW (ref 135–145)
Total Bilirubin: 0.5 mg/dL (ref 0.3–1.2)
Total Protein: 6.8 g/dL (ref 6.5–8.1)

## 2015-07-19 ENCOUNTER — Other Ambulatory Visit: Payer: Self-pay | Admitting: *Deleted

## 2015-07-19 LAB — VITAMIN D 25 HYDROXY (VIT D DEFICIENCY, FRACTURES): Vit D, 25-Hydroxy: 27 ng/mL — ABNORMAL LOW (ref 30.0–100.0)

## 2015-07-19 MED ORDER — HYDROCODONE-ACETAMINOPHEN 7.5-325 MG PO TABS: ORAL_TABLET | ORAL | Status: DC

## 2015-07-22 ENCOUNTER — Encounter: Payer: Self-pay | Admitting: Internal Medicine

## 2015-07-23 ENCOUNTER — Encounter (HOSPITAL_COMMUNITY)
Admission: RE | Admit: 2015-07-23 | Discharge: 2015-07-23 | Disposition: A | Discharge status: Home / Self Care | Payer: Medicare Other | Source: Skilled Nursing Facility | Attending: Internal Medicine | Admitting: Internal Medicine

## 2015-07-23 DIAGNOSIS — I10 Essential (primary) hypertension: Secondary | ICD-10-CM | POA: Diagnosis not present

## 2015-07-23 LAB — LIPID PANEL
Cholesterol: 185 mg/dL (ref 0–200)
HDL: 45 mg/dL
LDL Cholesterol: 96 mg/dL (ref 0–99)
Total CHOL/HDL Ratio: 4.1 ratio
Triglycerides: 218 mg/dL — ABNORMAL HIGH
VLDL: 44 mg/dL — ABNORMAL HIGH (ref 0–40)

## 2015-07-24 LAB — VITAMIN D 25 HYDROXY (VIT D DEFICIENCY, FRACTURES): Vit D, 25-Hydroxy: 28.7 ng/mL — ABNORMAL LOW (ref 30.0–100.0)

## 2015-08-07 ENCOUNTER — Other Ambulatory Visit (HOSPITAL_COMMUNITY)
Admission: RE | Admit: 2015-08-07 | Discharge: 2015-08-07 | Disposition: A | Discharge status: Home / Self Care | Payer: Medicare Other | Source: Skilled Nursing Facility | Attending: Internal Medicine | Admitting: Internal Medicine

## 2015-08-07 DIAGNOSIS — R05 Cough: Secondary | ICD-10-CM | POA: Diagnosis present

## 2015-08-07 LAB — INFLUENZA PANEL BY PCR (TYPE A & B)
H1N1FLUPCR: NOT DETECTED
INFLAPCR: NEGATIVE
INFLBPCR: NEGATIVE

## 2015-08-14 ENCOUNTER — Non-Acute Institutional Stay (SKILLED_NURSING_FACILITY): Payer: Medicare Other | Admitting: Internal Medicine

## 2015-08-14 DIAGNOSIS — J3089 Other allergic rhinitis: Secondary | ICD-10-CM

## 2015-08-14 DIAGNOSIS — G894 Chronic pain syndrome: Secondary | ICD-10-CM

## 2015-08-14 DIAGNOSIS — E871 Hypo-osmolality and hyponatremia: Secondary | ICD-10-CM | POA: Diagnosis not present

## 2015-08-14 DIAGNOSIS — H6123 Impacted cerumen, bilateral: Secondary | ICD-10-CM

## 2015-08-14 DIAGNOSIS — I10 Essential (primary) hypertension: Secondary | ICD-10-CM | POA: Diagnosis not present

## 2015-08-14 NOTE — Progress Notes (Signed)
Patient ID: Robin Blackburn, female   DOB: [birth year], 70 y.o.   MRN: [id number] Patient ID: Robin Blackburn, female   DOB: 1945-08-27, 70 y.o.   MRN: [id number]                  .   . This is a routine   visit  ACILITY: [facility]:   SNF   CHIEF COMPLAINT: Medical management of chronic medical conditions including bilateral metatarsal fractures-hypertension-depression-anxiety-chronic pain-rheumatoid arthritis-history CVA-- Acute visit secondary to complaints of head congestion       HISTORY OF PRESENT ILLNESS:  This is a patient whom we actually had in the building from September to mid October.  At that point, she had suffered an acute thalamic infarct and, after her arrival here, we discovered her to have a fracture of the right fifth metatarsal for which she was referred to Orthopedics.  She was discharged to her own apartment which apparently has four stairs to enter.         She was discovered during most recent  admission to have a minimally displaced fracture of the left fifth metatarsal and, as noted, has the original fracture that we diagnosed about a year ago---.  These issues have been relatively stable and followed by orthopedics--in fact she is now able to wear tennis shoes this appears to be quite stable which is encouraging--we did x-ray her left foot and ankle area recently secondary complaints of increased pain however did not really show any acute changes--she does have orthopedic follow-up scheduled      She does have a history of chronic pain and muscle discomfort-she is receiving Vicodin-7.5 325 every 4 hours when necessary- She is also on Robaxin  increased this to 7  50 mg every 6 hours when necessary-this appears to be helping some--although she continues to complain of pain at times-    has seen her rheumatologist  in the past who has prescribed  Plaqueni--and she has been taking this as well recently      She did go to  the ER recently after sustaining a fall CT of her head and neck and x-rays of her spine were negative for any acute process hips and pelvis all were negative as well-      Was also noted about 4 months ago she did have a guaiac positive stool  others have been negative-she also has a history of hemorrhoids was thought possibly this may have been a bleeding hemorrhoid which contributed to the one positive reading-her hemoglobin actually stable to somewhat improved-she is on a proton pump inhibitor I did discuss this  Again with her today-- she does not at this point want any GI workup-she feels this was a bleeding hemorrhoid and does not want any workup for now---     Patient does have a history of anxiety she is on Xanax as needed this appears to be relatively stable.  In regards to depression she is on Paxil which appears to be stable as well.  She does have a history of hypertension she is on Microzide recent blood pressures appear to be relatively stable--I see a recent systolic in the 697'X -I suspect sometimes a smaller cuffis used which leads to inaccurate readings at this point will monitor I see previous readings in the 480X systolically   She  also complained of some bilateral hearing loss she feels this has been essentially since she had her CVA-  We did give her a course of De Brox for some earwax issues-however during loss persisted and she has actually seen an ENT who actually manually disimpacted some earwax -- She feels she may be having some plugging of her ears again she also does have a history of some persistent nasal congestion allergy symptoms-she would like something for this as well today        She was seen late last year  for some increased erythema of her left arm-did go to the ER-she was started on doxycycline a venous Doppler was negative for any DVT-this resolved quite unremarkably   .     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      Fracture of the right fifth metatarsal in September, now with fracture of the left fifth metatarsal.    Physical deconditioning.    Essential hypertension.    Neuropathy.     Hyponatremia.    Hypokalemia.    History of rheumatoid arthritis, fibromyalgia and reflex sympathetic dystrophy, according to the patient.  Hyperlipidemia.     CURRENT MEDICATIONS:  Medication list is reviewed.            Xanax 1 mg tablet every 6 hours p.r.n.     Plavix 75 q.d.       Bentyl 20 q.6 hours p.r.n., abdominal cramps.    Lomotil 1 tablet two times daily as needed for diarrhea or loose stool.     Microzide 12.5 daily.    Norco 7.5/3.5 q.4 daily.    Prevacid 30 mg b.i.d. daily.    Lidoderm patch to the right knee.    Robaxin 750 mg q.6 p.r.n.     Zofran q.6 p.r.n.     Paxil 30 mg q.d.    K-dur 20 q.d.    Pravachol 10 q.d.     Restasis ophthalmic 1 drop b.i.d.    Sodium chloride nasal spray 1 spray, each nostril, b.i.d.    Ambien CR 12 mg by mouth daily at bedtime  Plaquenil 200 mg QD   Simethicone 80 mg 4 times a day when necessary   I note that aspirin and simvastatin were discontinued.    SOCIAL HISTORY:                HOUSING:  The patient lived in her own apartment.  There is apparently a four-stair entrance stairway that she needs to manage.   FUNCTIONAL STATUS:  She has in-home support which she pays for privately, according to her.    REVIEW OF SYSTEMS: In general does not complain of fever chills. --   Skin- Currently does not complain of rashes or itching  Head ears eyes nose mouth and throat  Feel she has some increased head congestion  nasal drainage       CHEST/RESPIRATORY:  --Does not complain of shortness of breath or increased cough CARDIAC:   No chest pain.     GI:   Is not complaining of nausea vomiting diarrhea constipation or abdominal discomfort today   GU does not complain of  suprapubic discomfort or dysuria.   MUSCULOSKELETAL:  Somewhat chronic complaints of generalized pain-   NeurologicSome previous history of complaints of neuropathic pain does not complain of numbness  or dizziness currently.--  Psych-does have a history of anxiety this point appears to be relatively stable   increased her Xanax back to 1 mg  every 6 hours secondary to increased anxiety symptoms apparently this is helping.     PHYSICAL EXAMINATION:  She is afebrile pulse 82 respirations 20 blood pressures ranging 139/84-160/77 most recently weight is stable at 210 s Gen.  pleasant elderly female in no distress    Skin is warm and dry.--    Eyes pupils appear reactive light sclera and conjunctiva are clear visual acuity appears intact.    Oropharynx is clear mucous membranes moist--appears to have some increased clear drainage   Ears-tympanic membranes are visualized there is some--mild moderate wax in ears bilaterally              CHEST/RESPIRATORY:  Clear air entry bilaterally. No labored breathing   CARDIOVASCULAR:   CARDIAC:   Heart sounds are normal rate and rhythm.  There are no murmurs.  She appears to be euvolemic--I would say minimal lower extremity edema.   GASTROINTESTINAL: Abdomen is obese soft does not appear to be overtly tender -bowel sounds are positive--  L  GENITOURINARY:  BLADDER:   I did not note any bladder distention or overt tenderness MUSCULOSKELETAL:   EXTREMITIES:   BILATERAL LOWER EXTREMITIES:    Moves both at baseline strength-she has minimal lower extremity edema  bilaterally  I do not note any deformities of the left foot there is some mild edema of the knees bilaterally      Strength upper extremities bilaterally appears grossly intact again possibly since mild right-sided weakness but this continues to be minimal  t-.  Neuro I could not really appreciate any overt lateralizing findings she does have a history of CVA her speech is clear  cranial nerves appear intact. Does have a history of fairly mild right-sided weakness --minimal deficit      .    Psych-she is alert and oriented pleasant and appropriate.  Labs  07/23/2015.  Lispro 185-triglycerides 218-HDL 45-LDL 96.  Vitamin D 28.7.  07/18/2015.  Sodium 131 potassium 3.5 BUN 8 creatinine 0.75.  Calcium 8.5-ALT 12-otherwise liver function tests within normal limits.  WBC 10.2 hemoglobin 11.8 platelets 486  Jan 23d 2017.  ESR 18.  C-reactive 1.4 mildly elevated.  Vitamin D 22.2.  06/03/2016  Sodium 131 potassium 4.1 BUN 7 creatinine 0.73.  WBC 11.4 hemoglobin 11.6 platelets 387   05/25/2015.  Sodium 132  05/24/2015.  Sodium 134 potassium 4.6 BUN 9 creatinine 0.74.    04/10/2015.  Sodium 129 potassium 3.7 BUN 9 creatinine 0.85.  ALT 11 otherwise liver function tests within normal limits.  WBC 14.0 hemoglobin 11.6 platelets 398   02/15/2015.  Sodium 132 potassium 4 BUN 7 creatinine 0.7 to.  WBC 8.9 hemoglobin 12.4 platelets 371.    02/12/2014-vitamin D level XXIV.5 this appears to be rising  January 18 2015.  Sodium 132 potassium 4.2 BUN 9 creatinine 0.68.  WBC 8.0 hemoglobin 12.2 platelets 380  12/20/2014.  Sodium 131 potassium 4.1 BUN 9 creatinine 0.71.  Calcium 8.8.  12/08/2014.  Vitamin D 22.6.  12/04/2014.  WBC 8.9 hemoglobin 11.1 platelets 377.  W in 3.1 AST 13-ALT 12 otherwise liver function tests within normal limits.  Cholesterol 183-triglycerides 269-HDL 47-LDL 82         .     ASSESSMENT/PLAN:  Allergy symptoms-today appears to be fairly minimal we'll treat with Flonase 1 spray in nostril twice a day for 5 days and monitor--she continues on Claritin as well      History of guaiac-positive stool-this is a 1 time positive reading-her hemoglobin has  been stable she is on a proton pump inhibitor--as noted above-- she does not want a GI workup at this time--will update her CBC     chronic pain muscle spasms -- rheumatoid arthritis-patient is on a significant dose of Vicodin as noted above- We have increased her Robaxin 750 mg every 6 hours previously apparently this is helping some--Plaquenil has been restarted as well. .       In regards hyperlipidemia this appears stable with cholesterol 185 HDL 45 LDL 96 she prefers to be off the statin--she feels it may have concluded to her generalized pain-      anxiety Her Xanax rhas been increased back to 1 mg every 6 hours when necessary this appears to be helping     .    History of remote left thalamic CVA.  Very little residual of this she continues on Plavix--.        History of metatarsal fractures bilaterally-this is followed by orthopedics--appears to be stable now essentially wearing street shoes which is encouraging--recent x-ray of left foot did not really show any acute process--          Hyponatremia and hypokalemia.  The etiology here is unclear. But appears to be stable during her stay here --sodium generally runs high 120s to low 130s --most recently   131 we will update this    History of rheumatoid arthritis, fibromyalgia and reflex sympathetic dystrophy, according to the patient.  She has chronic pain issues, medication changes as noted above-Plaquenil has been restarted--she is followed by rheumatology as well                                                                                 Hypertension-  Continues to have variable readings-at this point will monitor I suspect when  smaller cuff is used we get higher readings    GERD this appears stable on Prilosec she is no longer complaining symptoms with this will have to be monitored.         .    Depression-at this point is stableon Paxil .                             Insomnia she is on Ambien this was increased secondary patient requests saying the lower dose of Ambien was not effective she appears to have  tolerated this well although will have to be watched--      vitamin D deficiency-she had been re started on supplementation--vitamin D level was 28.7 on lab done February  Moderate wax buildup in ears-will treat again with the blocks and monitor.  Also will update a TSH --B-12  and hemoglobin A1c for updated values with patient being on multiple medications         [id number]- Of note greater than 35 minutes spent assessing patient-reviewing her chart-addressing her concerns -and coordinating and formulating a plan of care for numerous diagnoses-of note greater than 50% of time spent coordinating plan of care

## 2015-08-15 ENCOUNTER — Encounter (HOSPITAL_COMMUNITY)
Admission: AD | Admit: 2015-08-15 | Discharge: 2015-08-15 | Disposition: A | Discharge status: Home / Self Care | Payer: Medicare Other | Source: Skilled Nursing Facility | Attending: Internal Medicine | Admitting: Internal Medicine

## 2015-08-15 DIAGNOSIS — I10 Essential (primary) hypertension: Secondary | ICD-10-CM | POA: Diagnosis present

## 2015-08-15 DIAGNOSIS — R05 Cough: Secondary | ICD-10-CM | POA: Diagnosis present

## 2015-08-15 DIAGNOSIS — F419 Anxiety disorder, unspecified: Secondary | ICD-10-CM | POA: Insufficient documentation

## 2015-08-15 DIAGNOSIS — E876 Hypokalemia: Secondary | ICD-10-CM | POA: Insufficient documentation

## 2015-08-15 DIAGNOSIS — I6990 Unspecified sequelae of unspecified cerebrovascular disease: Secondary | ICD-10-CM | POA: Diagnosis present

## 2015-08-15 DIAGNOSIS — M797 Fibromyalgia: Secondary | ICD-10-CM | POA: Diagnosis present

## 2015-08-15 DIAGNOSIS — G894 Chronic pain syndrome: Secondary | ICD-10-CM | POA: Diagnosis not present

## 2015-08-15 DIAGNOSIS — E785 Hyperlipidemia, unspecified: Secondary | ICD-10-CM | POA: Insufficient documentation

## 2015-08-15 LAB — COMPREHENSIVE METABOLIC PANEL
ALT: 15 U/L (ref 14–54)
AST: 16 U/L (ref 15–41)
Albumin: 3.7 g/dL (ref 3.5–5.0)
Alkaline Phosphatase: 83 U/L (ref 38–126)
Anion gap: 6 (ref 5–15)
BUN: 9 mg/dL (ref 6–20)
CHLORIDE: 92 mmol/L — AB (ref 101–111)
CO2: 31 mmol/L (ref 22–32)
CREATININE: 0.69 mg/dL (ref 0.44–1.00)
Calcium: 8.6 mg/dL — ABNORMAL LOW (ref 8.9–10.3)
Glucose, Bld: 107 mg/dL — ABNORMAL HIGH (ref 65–99)
Potassium: 4 mmol/L (ref 3.5–5.1)
Sodium: 129 mmol/L — ABNORMAL LOW (ref 135–145)
Total Bilirubin: 0.6 mg/dL (ref 0.3–1.2)
Total Protein: 7.3 g/dL (ref 6.5–8.1)

## 2015-08-15 LAB — CBC WITH DIFFERENTIAL/PLATELET
Basophils Absolute: 0.1 10*3/uL (ref 0.0–0.1)
Basophils Relative: 1 %
EOS ABS: 0.3 10*3/uL (ref 0.0–0.7)
EOS PCT: 3 %
HCT: 37.8 % (ref 36.0–46.0)
HEMOGLOBIN: 12.4 g/dL (ref 12.0–15.0)
LYMPHS ABS: 3.1 10*3/uL (ref 0.7–4.0)
Lymphocytes Relative: 38 %
MCH: 30 pg (ref 26.0–34.0)
MCHC: 32.8 g/dL (ref 30.0–36.0)
MCV: 91.3 fL (ref 78.0–100.0)
MONO ABS: 0.9 10*3/uL (ref 0.1–1.0)
MONOS PCT: 11 %
Neutro Abs: 3.9 10*3/uL (ref 1.7–7.7)
Neutrophils Relative %: 47 %
PLATELETS: 491 10*3/uL — AB (ref 150–400)
RBC: 4.14 MIL/uL (ref 3.87–5.11)
RDW: 12.8 % (ref 11.5–15.5)
WBC: 8.2 10*3/uL (ref 4.0–10.5)

## 2015-08-15 LAB — VITAMIN B12: VITAMIN B 12: 345 pg/mL (ref 180–914)

## 2015-08-15 LAB — TSH: TSH: 1.014 u[IU]/mL (ref 0.350–4.500)

## 2015-08-16 LAB — HEMOGLOBIN A1C
Hgb A1c MFr Bld: 6.1 % — ABNORMAL HIGH (ref 4.8–5.6)
Mean Plasma Glucose: 128 mg/dL

## 2015-08-19 ENCOUNTER — Encounter: Payer: Self-pay | Admitting: Internal Medicine

## 2015-08-20 ENCOUNTER — Other Ambulatory Visit: Payer: Self-pay | Admitting: *Deleted

## 2015-08-20 ENCOUNTER — Other Ambulatory Visit
Admission: RE | Admit: 2015-08-20 | Payer: Medicare Other | Source: Skilled Nursing Facility | Admitting: Internal Medicine

## 2015-08-20 ENCOUNTER — Other Ambulatory Visit (HOSPITAL_COMMUNITY)
Admission: RE | Admit: 2015-08-20 | Discharge: 2015-08-20 | Disposition: A | Discharge status: Home / Self Care | Payer: Medicare Other | Source: Skilled Nursing Facility | Attending: Internal Medicine | Admitting: Internal Medicine

## 2015-08-20 DIAGNOSIS — I10 Essential (primary) hypertension: Secondary | ICD-10-CM | POA: Insufficient documentation

## 2015-08-20 LAB — BASIC METABOLIC PANEL
ANION GAP: 6 (ref 5–15)
BUN: 14 mg/dL (ref 6–20)
CHLORIDE: 92 mmol/L — AB (ref 101–111)
CO2: 33 mmol/L — ABNORMAL HIGH (ref 22–32)
Calcium: 8.7 mg/dL — ABNORMAL LOW (ref 8.9–10.3)
Creatinine, Ser: 0.7 mg/dL (ref 0.44–1.00)
GFR calc Af Amer: >60 mL/min (ref >60)
Glucose, Bld: 96 mg/dL (ref 65–99)
POTASSIUM: 4.1 mmol/L (ref 3.5–5.1)
SODIUM: 131 mmol/L — AB (ref 135–145)

## 2015-08-20 MED ORDER — ZOLPIDEM TARTRATE ER 12.5 MG PO TBCR: 12.5000 mg | EXTENDED_RELEASE_TABLET | Freq: Every evening | ORAL | Status: DC | PRN

## 2015-08-20 NOTE — Telephone Encounter (Signed)
[employer]

## 2015-09-05 ENCOUNTER — Other Ambulatory Visit: Payer: Self-pay

## 2015-09-05 MED ORDER — HYDROCODONE-ACETAMINOPHEN 7.5-325 MG PO TABS: ORAL_TABLET | ORAL | Status: DC

## 2015-09-05 NOTE — Telephone Encounter (Signed)
RX faxed to [facility] @ [phone]. Phone number [phone]  

## 2015-09-28 ENCOUNTER — Encounter: Payer: Self-pay | Admitting: Internal Medicine

## 2015-09-28 ENCOUNTER — Non-Acute Institutional Stay (SKILLED_NURSING_FACILITY): Payer: Medicare Other | Admitting: Internal Medicine

## 2015-09-28 DIAGNOSIS — G8929 Other chronic pain: Secondary | ICD-10-CM

## 2015-09-28 DIAGNOSIS — I10 Essential (primary) hypertension: Secondary | ICD-10-CM

## 2015-09-28 DIAGNOSIS — Z8781 Personal history of (healed) traumatic fracture: Secondary | ICD-10-CM

## 2015-09-28 DIAGNOSIS — M79672 Pain in left foot: Secondary | ICD-10-CM

## 2015-09-28 DIAGNOSIS — M069 Rheumatoid arthritis, unspecified: Secondary | ICD-10-CM

## 2015-09-28 DIAGNOSIS — M797 Fibromyalgia: Secondary | ICD-10-CM | POA: Diagnosis not present

## 2015-09-28 MED ORDER — METOPROLOL TARTRATE 25 MG PO TABS: 25.0000 mg | ORAL_TABLET | Freq: Two times a day (BID) | ORAL | Status: DC

## 2015-09-28 NOTE — Progress Notes (Signed)
Patient ID: Robin Blackburn, female   DOB: August 31, 1945, 70 y.o.   MRN: [id number] This is a nursing facility follow up for  Follow up of chronic medical diagnoses  Interim medical record and care since last [facility] visit was updated with review of diagnostic studies and change in clinical status since last visit were documented.  HPI: She describes dull-throbbing pain of the left foot radiating to the heel which is relieved with pain medication and supine position. She states that the pain began after she fell in January getting out of her wheelchair. There was no cardiac or neurologic prodrome prior to the fall She had a head CT at that time. She saw Dr.Bednars in May 2012 for fracture of the left ankle documented on CT. Subsequently she has see his partner Dr. Doran Durand. She saw Dr.Alusio for degenerative changes of the knees and had steroid injections. She is followed by Dr. Estanislado Pandy, Rheumatologist for rheumatoid arthritis and osteoarthritis; she is on Plaquenil. Fibromyalgia has been diagnosed. She denies a history of L2. She saw Dr. Ellene Route in September 2015 for lumbar spondylosis  Significant past history includes stroke  associated with right upper extremity residual weakness. She has had a splint for the right lower extremity ; but she is not using this.  She has had a history of recurrent falls in the setting of the stroke and deconditioning. Chest history of bilateral tarsal tunnel release surgery  Comprehensive review of systems: She has some visual acuity loss. She does have retinal evaluations every 6 months because of the Plaquenil. She feels her blood pressure stays elevated because of the chronic pain in the foot. She is on HCTZ; no PMH of gout   Physical exam:  Pertinent or positive findings:She is in a wheelchair and propels herself with her feet there is asymmetry of the feet with a low arch on the left. Fusiform changes of the left ankle are present. Left foot  appears slightly inverted vs the R. Reflexes 1/2+ at the knees.  The toenails are thickened   General appearance is one of good health and nourishment w/o distress. Eyes: No conjunctival inflammation or scleral icterus is present. Heart:  Normal rate and regular rhythm. S1 and S2 normal without gallop, murmur, click, rub or other extra sounds    Lungs:Chest clear to auscultation; no wheezes, rhonchi,rales ,or rubs present.No increased work of breathing.  Abdomen: Protuberant;bowel sounds normal, soft and non-tender without masses, organomegaly or hernias noted.  No guarding or rebound .  Negative straight leg raising bilaterally.  Skin:Warm & dry.  Intact without suspicious lesions or rashes ; no jaundice or tenting Lymphatic: No lymphadenopathy is noted about the head, neck, axilla    See summary under each active problem in the Problem List with associated updated therapeutic plan

## 2015-09-28 NOTE — Progress Notes (Signed)
This encounter was created in error - please disregard.

## 2015-09-28 NOTE — Assessment & Plan Note (Signed)
Repeat blood pressure 150/100  Metroprolol 25 mg twice a day will be added Uric acid will be checked

## 2015-09-28 NOTE — Assessment & Plan Note (Addendum)
Low arch on L; partial inversion left foot ; osteoarthritis ;status post left ankle fracture Pain is most likely mechanical in nature related to factors above On HCTZ ; clinically gout is not suspect  Podiatry referral, Dr Paulla Dolly, Letta Kocher

## 2015-09-28 NOTE — Patient Instructions (Signed)
orders for Matrix entry Metoprolol 25 mg twice a day Uric acid Podiatry referral; Dr Gaynelle Cage

## 2015-09-29 ENCOUNTER — Encounter (HOSPITAL_COMMUNITY)
Admission: RE | Admit: 2015-09-29 | Discharge: 2015-09-29 | Disposition: A | Discharge status: Home / Self Care | Payer: Medicare Other | Source: Skilled Nursing Facility | Attending: Internal Medicine | Admitting: Internal Medicine

## 2015-09-29 DIAGNOSIS — M797 Fibromyalgia: Secondary | ICD-10-CM | POA: Insufficient documentation

## 2015-09-29 LAB — URIC ACID: Uric Acid, Serum: 4.8 mg/dL (ref 2.3–6.6)

## 2015-10-03 ENCOUNTER — Ambulatory Visit (HOSPITAL_COMMUNITY)
Admit: 2015-10-03 | Discharge: 2015-10-03 | Disposition: A | Discharge status: Home / Self Care | Payer: Medicare Other | Source: Ambulatory Visit | Attending: Interventional Radiology | Admitting: Interventional Radiology

## 2015-10-03 DIAGNOSIS — M810 Age-related osteoporosis without current pathological fracture: Secondary | ICD-10-CM | POA: Insufficient documentation

## 2015-10-03 DIAGNOSIS — M85831 Other specified disorders of bone density and structure, right forearm: Secondary | ICD-10-CM | POA: Diagnosis not present

## 2015-10-11 ENCOUNTER — Non-Acute Institutional Stay (SKILLED_NURSING_FACILITY): Payer: Medicare Other | Admitting: Internal Medicine

## 2015-10-11 ENCOUNTER — Encounter: Payer: Self-pay | Admitting: Internal Medicine

## 2015-10-11 DIAGNOSIS — G8929 Other chronic pain: Secondary | ICD-10-CM

## 2015-10-11 DIAGNOSIS — J309 Allergic rhinitis, unspecified: Secondary | ICD-10-CM | POA: Diagnosis not present

## 2015-10-11 DIAGNOSIS — E559 Vitamin D deficiency, unspecified: Secondary | ICD-10-CM | POA: Insufficient documentation

## 2015-10-11 DIAGNOSIS — I10 Essential (primary) hypertension: Secondary | ICD-10-CM

## 2015-10-11 DIAGNOSIS — I639 Cerebral infarction, unspecified: Secondary | ICD-10-CM | POA: Diagnosis not present

## 2015-10-11 DIAGNOSIS — M79672 Pain in left foot: Secondary | ICD-10-CM

## 2015-10-11 NOTE — Progress Notes (Signed)
Patient ID: [name], female   DOB: 1945/12/11, 70 y.o.   MRN: [id number]                    .   . This is a routine--acute   visit  ACILITY: [facility]:   SNF   CHIEF COMPLAINT: Medical management of chronic medical conditions including bilateral metatarsal fractures-hypertension-depression-anxiety-chronic pain--history CVA-- Acute visit secondary to complaints of head congestion continued concerns of foot discomfort -follow-up vitamin D deficiency      HISTORY OF PRESENT ILLNESS:  This is a patient whom we actually had in the building from September to mid October.  At that point, she had suffered an acute thalamic infarct and, after her arrival here, we discovered her to have a fracture of the right fifth metatarsal for which she was referred to Orthopedics.  She was discharged to her own apartment which apparently has four stairs to enter.         She was discovered during most recent  admission to have a minimally displaced fracture of the left fifth metatarsal and, as noted, has the original fracture that we diagnosed about a year ago---.  These issues have been relatively stable and followed by orthopedics----we did x-ray her left foot and ankle area recently secondary complaints of increased pain however did not really show any acute changes--Dr. Linna Darner did recently see her in consult this could be more dietary issue-apparently also has been ordered but will recheck tha----t patient would prefer Dr. Paulla Dolly  in [place]      She does have a history of chronic pain and muscle discomfort-she is receiving Vicodin-7.5 325 every 4 hours when necessary- She is also on Robaxin  increased this to 7  50 mg every 6 hours when necessary-this appears to be helping some--although she continues to complain of pain at times-    has seen her rheumatologist  in the past who has prescribed  Plaqueni--and she has been taking this as well  recently          Was also noted about 4 months ago she did have a guaiac positive stool  others have been negative-she also has a history of hemorrhoids was thought possibly this may have been a bleeding hemorrhoid which contributed to the one positive reading-her hemoglobin actually stable to somewhat improved-she is on a proton pump inhibitor I -- she does not at this point want any GI workup-she feels this was a bleeding hemorrhoid and does not want any workup for now---     Patient does have a history of anxiety she is on Xanax as needed this appears to be relatively stable.  In regards to depression she is on Paxil which appears to be stable as well.  She does have a history of hypertension she is on Microzide In Dr. Linna Darner recently started Lopressor 25 mg twice a day this appears to have be having a beneficial effect recent blood pressures 148/72-128/78 there still is some variability at this point will monitor before making further medications changes-recent pulses appear to be in the 70s this appears to be well tolerated but will have to be watched   She  also complained of some bilateral hearing loss she feels this has been essentially since she had her CVA- We did give her a course of De Brox for some earwax issues-however during loss persisted and she has actually seen an ENT who actually manually disimpacted  some earwax --  She continues complain at times of stuffiness of her ears-she is on Claritin for suspected allergies as well as Flonase at times-she says Mucinex has helped in the past and we will prescribe this for a short several-day courses see if this gives some relief she does state she has an occasional cough as well but no shortness of breath       .     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      Fracture of the right fifth metatarsal in September, now with fracture of the  left fifth metatarsal.    Physical deconditioning.    Essential hypertension.    Neuropathy.     Hyponatremia.    Hypokalemia.    History of rheumatoid arthritis, fibromyalgia and reflex sympathetic dystrophy, according to the patient.  Hyperlipidemia.     CURRENT MEDICATIONS:  Medication list is reviewed.            Xanax 1 mg tablet every 6 hours p.r.n.     Plavix 75 q.d.       Bentyl 20 q.6 hours p.r.n., abdominal cramps.    Lomotil 1 tablet two times daily as needed for diarrhea or loose stool.     Microzide 12.5 daily.    Metoprolol 25 mg twice a day  Norco 7.5/3.5 q.4 daily.    Prevacid 30 mg b.i.d. daily.    Lidoderm patch to the right knee.    Robaxin 750 mg q.6 p.r.n.     Zofran q.6 p.r.n.     Paxil 30 mg q.d.    K-dur 20 q.d.    Pravachol 10 q.d.     Restasis ophthalmic 1 drop b.i.d.    Sodium chloride nasal spray 1 spray, each nostril, b.i.d.    Ambien CR 12 mg by mouth daily at bedtime  Plaquenil 200 mg QD   Simethicone 80 mg 4 times a day when necessary   I note that aspirin and simvastatin were discontinued.    SOCIAL HISTORY:                HOUSING:  The patient lived in her own apartment.  There is apparently a four-stair entrance stairway that she needs to manage.   FUNCTIONAL STATUS:  She has in-home support which she pays for privately, according to her.    REVIEW OF SYSTEMS: In general does not complain of fever chills. --   Skin- Currently does not complain of rashes or itching  Head ears eyes nose mouth and throat  Feel she has some increased head congestion         CHEST/RESPIRATORY:  --Does not complain of shortness of breath has occasional cough CARDIAC:   No chest pain.     GI:   Is not complaining of nausea vomiting diarrhea constipation or abdominal discomfort today   GU does not complain of suprapubic discomfort or dysuria.   MUSCULOSKELETAL:  Somewhat chronic complaints of generalized pain-is complaining  more recently of left foot discomfort as noted above this it relieves with rest and current pain medications   NeurologicSome previous history of complaints of neuropathic pain does not complain of numbness  or dizziness currently.--  Psych-does have a history of anxiety this point appears to be relatively stable   increased her Xanax back to 1 mg every 6 hours secondary to increased anxiety symptoms apparently this is  helping.     PHYSICAL EXAMINATION:   Temperature 98.1 pulse 72 respirations 18 blood pressure 148/72-128/78 weight is 215.6 s Gen.  pleasant elderly female in no distress    Skin is warm and dry.--She does have makeup applied    Eyes pupils appear reactive light sclera and conjunctiva are clear visual acuity appears intact.    Oropharynx is clear mucous membranes moist--             CHEST/RESPIRATORY:  Clear air entry bilaterally. No labored breathing   CARDIOVASCULAR:   CARDIAC:   Heart sounds are normal rate and rhythm.  There are no murmurs.  She appears to be euvolemic--I would say minimal lower extremity edema.   GASTROINTESTINAL: Abdomen is obese soft non tender-bowel sounds are positive--  L  GENITOURINARY:  BLADDER:   I did not note any bladder tenderness or overt distention MUSCULOSKELETAL:   EXTREMITIES:   BILATERAL LOWER EXTREMITIES:    Moves both at baseline strength-she has minimal lower extremity edema  bilaterally  I do not note any  acute deformities of the left foot other than possibly some slight inversion there is some mild edema of the knees bilaterally There is some mild tenderness to palpation of the left foot      Strength upper extremities bilaterally appears grossly intact again possibly since mild right-sided weakness but this continues to be minimal  t-.  Neuro I could not really appreciate any overt lateralizing findings she does have a history of CVA her speech is clear cranial nerves appear intact. Does have a history of  fairly mild right-sided weakness --minimal deficit      .    Psych-she is alert and oriented pleasant and appropriate.  Labs  09/29/2015.  Uric acid 4.8.  08/20/2015.  Sodium 131 potassium 4.1 BUN 14 creatinine 0.7.  WBC 8.2 hemoglobin 12.4 platelets 491.  TSH-1.014.  08/15/2015.  Liver function tests within normal limits.  Hemoglobin A1c 6.1.  Vitamin B12 345.    07/23/2015.  Chol 185-triglycerides 218-HDL 45-LDL 96.  Vitamin D 28.7.  07/18/2015.  Sodium 131 potassium 3.5 BUN 8 creatinine 0.75.  Calcium 8.5-ALT 12-otherwise liver function tests within normal limits.  WBC 10.2 hemoglobin 11.8 platelets 486  Jan 23d 2017.  ESR 18.  C-reactive 1.4 mildly elevated.  Vitamin D 22.2.  06/03/2016  Sodium 131 potassium 4.1 BUN 7 creatinine 0.73.  WBC 11.4 hemoglobin 11.6 platelets 387   05/25/2015.  Sodium 132  05/24/2015.  Sodium 134 potassium 4.6 BUN 9 creatinine 0.74.    04/10/2015.  Sodium 129 potassium 3.7 BUN 9 creatinine 0.85.  ALT 11 otherwise liver function tests within normal limits.  WBC 14.0 hemoglobin 11.6 platelets 398   02/15/2015.  Sodium 132 potassium 4 BUN 7 creatinine 0.7 to.  WBC 8.9 hemoglobin 12.4 platelets 371.    02/12/2014-vitamin D level XXIV.5 this appears to be rising  January 18 2015.  Sodium 132 potassium 4.2 BUN 9 creatinine 0.68.  WBC 8.0 hemoglobin 12.2 platelets 380  12/20/2014.  Sodium 131 potassium 4.1 BUN 9 creatinine 0.71.  Calcium 8.8.  12/08/2014.  Vitamin D 22.6.  12/04/2014.  WBC 8.9 hemoglobin 11.1 platelets 377.  W in 3.1 AST 13-ALT 12 otherwise liver function tests within normal limits.  Cholesterol 183-triglycerides 269-HDL 47-LDL 82         .     ASSESSMENT/PLAN:  Allergy symptoms- She continues to complain occasionally some stuffiness-she says Mucinex as helped in the past we'll do a short  course of 4 days and Mucinex 600 mg twice a day to see if this  gives her relief she also is on Claritin      History of guaiac-positive stool-this is a 1 time positive reading-her hemoglobin has been stable she is on a proton pump inhibitor--as noted above-- she does not want a GI workup at this time--will update her CBC    chronic pain muscle spasms -- rheumatoid arthritis-patient is on a significant dose of Vicodin as noted above- We have increased her Robaxin 750 mg every 6 hours previously apparently this is helping some--Plaquenil has been restarted as well. .       In regards hyperlipidemia this appears stable with cholesterol 185 HDL 45 LDL 96 she prefers to be off the statin--she feels it may have concluded to her generalized pain-      anxiety Her Xanax rhas been increased back to 1 mg every 6 hours when necessary this appears to be helping     .    History of remote left thalamic CVA.  Very little residual of this she continues on Plavix--.        History of metatarsal fractures bilaterally-this is followed by orthopedics-  She has complained again of some left foot pain-will double check to make sure a podiatry consult with Dr. Lawrence Marseilles has been ordered-            Hyponatremia and hypokalemia.  The etiology here is unclear. But appears to be stable during her stay here --sodium generally runs high 120s to low 130s --most recently   131 we will update this    History of rheumatoid arthritis, fibromyalgia and reflex sympathetic dystrophy, according to the patient.  She has chronic pain issues, medication changes as noted above-Plaquenil has been restarted--she is followed by rheumatology as well                                                                                 Hypertension-  Continues to have variable readings-although blood pressure appears to be having a beneficial effects continues on Microzide 0.5 milligrams a day and Lopressor 25 mg twice a day-at this point will monitor since blood  pressures appear be somewhat improved although we may have to make further adjustments down the road    GERD this appears stable on Prilosec she is no longer complaining symptoms with this will have to be monitored.         .    Depression-at this point is stableon Paxil .                             Insomnia she is on Ambien this was increased secondary patient requests saying the lower dose of Ambien was not effective she appears to have tolerated this well although will have to be watched--      vitamin D deficiency-it was my impression she has been restarted on vitamin D earlier this year but I do not see that listed on her meds will restart this-vitamin D level was 28.7 on lab done February  [id number]- Of note greater than 35 minutes spent assessing patient-reviewing her chart including labs previous progress notes-addressing her concerns -and coordinating and formulating a plan of care for numerous diagnoses-of note greater than 50% of time spent coordinating plan of care

## 2015-10-12 ENCOUNTER — Encounter (HOSPITAL_COMMUNITY)
Admission: AD | Admit: 2015-10-12 | Discharge: 2015-10-12 | Disposition: A | Discharge status: Home / Self Care | Payer: Medicare Other | Source: Skilled Nursing Facility | Attending: Internal Medicine | Admitting: Internal Medicine

## 2015-10-12 DIAGNOSIS — G894 Chronic pain syndrome: Secondary | ICD-10-CM | POA: Insufficient documentation

## 2015-10-12 DIAGNOSIS — E876 Hypokalemia: Secondary | ICD-10-CM | POA: Diagnosis not present

## 2015-10-12 DIAGNOSIS — I10 Essential (primary) hypertension: Secondary | ICD-10-CM | POA: Diagnosis present

## 2015-10-12 DIAGNOSIS — M797 Fibromyalgia: Secondary | ICD-10-CM | POA: Insufficient documentation

## 2015-10-12 DIAGNOSIS — E785 Hyperlipidemia, unspecified: Secondary | ICD-10-CM | POA: Insufficient documentation

## 2015-10-12 DIAGNOSIS — F419 Anxiety disorder, unspecified: Secondary | ICD-10-CM | POA: Insufficient documentation

## 2015-10-12 DIAGNOSIS — M069 Rheumatoid arthritis, unspecified: Secondary | ICD-10-CM | POA: Insufficient documentation

## 2015-10-12 DIAGNOSIS — I6990 Unspecified sequelae of unspecified cerebrovascular disease: Secondary | ICD-10-CM | POA: Insufficient documentation

## 2015-10-12 LAB — CBC WITH DIFFERENTIAL/PLATELET
BASOS ABS: 0.1 10*3/uL (ref 0.0–0.1)
BASOS PCT: 1 %
EOS PCT: 3 %
Eosinophils Absolute: 0.3 10*3/uL (ref 0.0–0.7)
HCT: 35.2 % — ABNORMAL LOW (ref 36.0–46.0)
Hemoglobin: 11.7 g/dL — ABNORMAL LOW (ref 12.0–15.0)
Lymphocytes Relative: 38 %
Lymphs Abs: 3.4 10*3/uL (ref 0.7–4.0)
MCH: 30.5 pg (ref 26.0–34.0)
MCHC: 33.2 g/dL (ref 30.0–36.0)
MCV: 91.7 fL (ref 78.0–100.0)
MONO ABS: 0.9 10*3/uL (ref 0.1–1.0)
Monocytes Relative: 10 %
NEUTROS PCT: 48 %
Neutro Abs: 4.3 10*3/uL (ref 1.7–7.7)
PLATELETS: 382 10*3/uL (ref 150–400)
RBC: 3.84 MIL/uL — ABNORMAL LOW (ref 3.87–5.11)
RDW: 13.3 % (ref 11.5–15.5)
WBC: 9 10*3/uL (ref 4.0–10.5)

## 2015-10-12 LAB — BASIC METABOLIC PANEL
ANION GAP: 10 (ref 5–15)
BUN: 10 mg/dL (ref 6–20)
CALCIUM: 8.6 mg/dL — AB (ref 8.9–10.3)
CO2: 29 mmol/L (ref 22–32)
CREATININE: 0.81 mg/dL (ref 0.44–1.00)
Chloride: 92 mmol/L — ABNORMAL LOW (ref 101–111)
GFR calc Af Amer: >60 mL/min (ref >60)
GLUCOSE: 108 mg/dL — AB (ref 65–99)
Potassium: 4.2 mmol/L (ref 3.5–5.1)
Sodium: 131 mmol/L — ABNORMAL LOW (ref 135–145)

## 2015-10-18 ENCOUNTER — Other Ambulatory Visit: Payer: Self-pay

## 2015-10-18 MED ORDER — ZOLPIDEM TARTRATE ER 12.5 MG PO TBCR: 12.5000 mg | EXTENDED_RELEASE_TABLET | Freq: Every evening | ORAL | Status: DC | PRN

## 2015-10-18 MED ORDER — HYDROCODONE-ACETAMINOPHEN 7.5-325 MG PO TABS: 1.0000 | ORAL_TABLET | ORAL | Status: DC | PRN

## 2015-10-18 NOTE — Telephone Encounter (Signed)
Rx faxed to [facility] at [phone].   Phone #: [phone]  

## 2015-10-31 ENCOUNTER — Encounter: Payer: Self-pay | Admitting: Podiatry

## 2015-10-31 ENCOUNTER — Ambulatory Visit (INDEPENDENT_AMBULATORY_CARE_PROVIDER_SITE_OTHER): Payer: Medicare Other | Admitting: Podiatry

## 2015-10-31 VITALS — BP 157/90 | HR 62 | Resp 16

## 2015-10-31 DIAGNOSIS — M21619 Bunion of unspecified foot: Secondary | ICD-10-CM | POA: Diagnosis not present

## 2015-10-31 DIAGNOSIS — M722 Plantar fascial fibromatosis: Secondary | ICD-10-CM | POA: Diagnosis not present

## 2015-10-31 DIAGNOSIS — M79672 Pain in left foot: Secondary | ICD-10-CM

## 2015-10-31 MED ORDER — TRIAMCINOLONE ACETONIDE 10 MG/ML IJ SUSP
10.0000 mg | Freq: Once | INTRAMUSCULAR | Status: AC
  Administered 2015-10-31: 10 mg

## 2015-10-31 NOTE — Progress Notes (Signed)
   Subjective:    Patient ID: Robin Blackburn, female    DOB: Sep 24, 1945, 70 y.o.   MRN: [id number]  HPI    Review of Systems  Constitutional: Positive for diaphoresis, fatigue and unexpected weight change.  HENT: Positive for rhinorrhea, sneezing and tinnitus.   Gastrointestinal: Positive for diarrhea and constipation.  Endocrine: Positive for cold intolerance.  Musculoskeletal: Positive for myalgias, back pain, arthralgias and gait problem.  Allergic/Immunologic: Positive for environmental allergies and food allergies.  Neurological: Positive for weakness and numbness.  Psychiatric/Behavioral: The patient is nervous/anxious.   All other systems reviewed and are negative.      Objective:   Physical Exam        Assessment & Plan:

## 2015-10-31 NOTE — Patient Instructions (Signed)

## 2015-10-31 NOTE — Progress Notes (Signed)
Subjective:     Patient ID: Robin Blackburn, female   DOB: 02-20-1946, 70 y.o.   MRN: [id number]  HPI patient presents stating that she's had a lot of pain in her left foot and it seems a lot of it's in the arch but also into the forefoot. Does not remember injury and it's been present for around 4 months   Review of Systems  All other systems reviewed and are negative.      Objective:   Physical Exam  Constitutional: She is oriented to person, place, and time.  Cardiovascular: Intact distal pulses.   Neurological: She is oriented to person, place, and time.  Skin: Skin is warm and dry.  Nursing note and vitals reviewed.  neurovascular status found to be intact with diminished range of motion of the subtalar midtarsal joint bilateral and patient on the left foot noted to have discomfort in the mid arch area around the structural first MPJ and the lesser MPJs to a lesser degree. Patient is noted to have good digital perfusion and is in a wheelchair and is not ambulatory currently     Assessment:     Difficult problem with back as complicating factor and nonweightbearing as complicating factor with possibility for fasciitis with inflammatory changes    Plan:     H&P condition reviewed and today mid arch injection administered 3 mg Kenalog 5 mill grams Xylocaine and dispensed night splint with instructions on wearing it when nonweightbearing. Patient be seen back if symptoms persist

## 2015-11-01 ENCOUNTER — Encounter: Payer: Self-pay | Admitting: Internal Medicine

## 2015-11-01 ENCOUNTER — Non-Acute Institutional Stay (SKILLED_NURSING_FACILITY): Payer: Medicare Other | Admitting: Internal Medicine

## 2015-11-01 DIAGNOSIS — E559 Vitamin D deficiency, unspecified: Secondary | ICD-10-CM | POA: Diagnosis not present

## 2015-11-01 DIAGNOSIS — G8929 Other chronic pain: Secondary | ICD-10-CM | POA: Diagnosis not present

## 2015-11-01 DIAGNOSIS — M79672 Pain in left foot: Secondary | ICD-10-CM

## 2015-11-01 DIAGNOSIS — M810 Age-related osteoporosis without current pathological fracture: Secondary | ICD-10-CM | POA: Diagnosis not present

## 2015-11-01 NOTE — Assessment & Plan Note (Signed)
As per Dr.Deveshwar ;F/U 10/17

## 2015-11-01 NOTE — Progress Notes (Signed)
Patient ID: Robin Blackburn, female   DOB: [birth year], 70 y.o.   MRN: [id number]    Facility Location: [facility] Room Number: 158- P  This is a nursing facility follow up of chronic medical diagnoses of Osteoporosis and left foot pain.  Interim medical record and care since last [facility] visit was updated with review of diagnostic studies and change in clinical status since last visit were documented.  HPI: She had a bone density performed in February of this year which was ordered by her rheumatologist. This showed significant osteoporosis with a T score of -3 at the left femur. She is on no bone building therapy other than calcium supplements 2000 units daily. By history she was intolerant to multiple bisphosphonates She saw Dr.Regal, podiatrist 10/31/15 for left foot pain. His tentative diagnosis was fasciitis with inflammation. He injected Kenalog into the mid arch and recommended a night splint.  By history she is intolerant to glucosamine.  She describes cramps in the left leg up to the groin. She describes arthralgias in multiple joints.   Pertinent or positive findings include: She has a resting tremor of the left hand. There is crepitus and fusiform enlargement of the knees. General appearance :adequately nourished; in no distress. Eyes: No conjunctival inflammation or scleral icterus is present. Heart:  Normal rate and regular rhythm. S1 and S2 normal without gallop, murmur, click, rub or other extra sounds   Lungs:Chest clear to auscultation; no wheezes, rhonchi,rales ,or rubs present.No increased work of breathing.  Abdomen: bowel sounds normal, soft and non-tender without masses, organomegaly or hernias noted.  No guarding or rebound. Vascular : all pulses equal ; no bruits present. Skin:Warm & dry.  Intact without suspicious lesions or rashes ; no tenting or jaundice  Lymphatic: No lymphadenopathy is noted about the head, neck, axilla Neuro: Strength  normal     See summary under each active problem in the Problem List with associated updated therapeutic plan

## 2015-11-01 NOTE — Patient Instructions (Signed)
Encouraged to continue the splint at night as recommended by Dr.Regal. Dr.Deveshwar will monitor vitamin D and bone densities. She will make the decision as to initiation of parenteral bone building therapy for the osteoporosis.

## 2015-11-01 NOTE — Assessment & Plan Note (Addendum)
Shellfish allergy precludes glucosamine Compliance with foot splint at night encouraged

## 2015-11-06 ENCOUNTER — Non-Acute Institutional Stay (SKILLED_NURSING_FACILITY): Payer: Medicare Other | Admitting: Internal Medicine

## 2015-11-06 ENCOUNTER — Encounter: Payer: Self-pay | Admitting: Internal Medicine

## 2015-11-06 DIAGNOSIS — M79672 Pain in left foot: Secondary | ICD-10-CM

## 2015-11-06 DIAGNOSIS — M797 Fibromyalgia: Secondary | ICD-10-CM | POA: Diagnosis not present

## 2015-11-06 DIAGNOSIS — I10 Essential (primary) hypertension: Secondary | ICD-10-CM | POA: Diagnosis not present

## 2015-11-06 DIAGNOSIS — G8929 Other chronic pain: Secondary | ICD-10-CM | POA: Diagnosis not present

## 2015-11-06 DIAGNOSIS — E871 Hypo-osmolality and hyponatremia: Secondary | ICD-10-CM | POA: Diagnosis not present

## 2015-11-06 DIAGNOSIS — J309 Allergic rhinitis, unspecified: Secondary | ICD-10-CM

## 2015-11-06 NOTE — Progress Notes (Signed)
Patient ID: Robin Blackburn, female   DOB: [birth year], 70 y.o.   MRN: [id number]                     .   . This is a routine--   visit  ACILITY: [facility]:   SNF   CHIEF COMPLAINT: Medical management of chronic medical conditions including bilateral metatarsal fractures-hypertension-depression-anxiety-chronic pain--history CVA--      HISTORY OF PRESENT ILLNESS:  This is a patient whom we actually had in the building for a couple stays-originally.  she had suffered an acute thalamic infarct and, after her arrival here, we discovered her to have a fracture of the right fifth metatarsal for which she was referred to Orthopedics.  She was discharged to her own apartment which apparently has four stairs to enter.         She was discovered during most recent  admission to have a minimally displaced fracture of the left fifth metatarsal and, as noted, has the original fracture that we diagnosed about a year ago---.  These issues have been relatively stable and followed by orthopedics----we did x-ray her left foot and ankle area recently secondary complaints of increased pain however did not really show any acute changes--Dr. Linna Darner did order podiatry consult which was recently completed-she was diagnosed with fasciitis with inflammation and recommendation was for a night splint-she has been encouraged to use this.  She also received a steroid injection.  She's continues to complain of some pain in the foot says it makes it difficult to sleep.  She is on numerous pain medications including Vicodin 7.5-325 mg every 4 hours when necessary-Robaxin 750 mg every 6 hours when necessary-she does have an order for Lidoderm patch apparently there are financial issues and she has not really been using them        has seen her rheumatologist  in the past who has prescribed  Plaqueni--and she has been taking this as well recently Rheumatologist did order a  bone density test as well which showed osteoporosis with a T score of 3 of the left femur  --she is on calcium supplementation she has numerous allergies to buy phosphonate          Was also noted about 6 months ago she did have a guaiac positive stool  others have been negative-she also has a history of hemorrhoids was thought possibly this may have been a bleeding hemorrhoid which contributed to the one positive reading-her hemoglobin actually stable to somewhat improved-she is on a proton pump inhibitor I -- she does not at this point want any GI workup-she feels this was a bleeding hemorrhoid and does not want any workup for now--- most recent hemoglobin in early May showed stability at 11.7     Patient does have a history of anxiety she is on Xanax 1 mg 3 times a day  In regards to depression she is on Paxil which appears to be stable as well.  She does have a history of hypertension she is on Microzide In Dr. Linna Darner recently started Lopressor 25 mg twice a day this appears to have be having a beneficial effect Blood pressures appear to have normalize recently-113/53-130/68 I do see 156/91 but this appears to be more the exception recently pulses appear to be stable at was in the 60s on exam this evening I do see one listed as 58 but again this does not appear to be  typical   She  also complained of some bilateral hearing loss she feels this has been essentially since she had her CVA- We did give her a course of De Brox for some earwax issues-however during loss persisted and she has actually seen an ENT who actually manually disimpacted some earwax --   Respiratory wise she does not have any complaints this evening she is on Claritin for history of allergies-has in the past received Mucinex for cough but she is not complaining of that this evening       .     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      Fracture of the right fifth metatarsal in September, now with fracture of the left fifth metatarsal.    Physical deconditioning.    Essential hypertension.    Neuropathy.     Hyponatremia.    Hypokalemia.    History of rheumatoid arthritis, fibromyalgia and reflex sympathetic dystrophy, according to the patient.  Hyperlipidemia.     CURRENT MEDICATIONS:  Medication list is reviewed.            Xanax 1 mg tablet TID.     Plavix 75 q.d.       Bentyl 20 q.6 hours p.r.n., abdominal cramps.    Lomotil 1 tablet two times daily as needed for diarrhea or loose stool.     Microzide 12.5 daily.    Metoprolol 25 mg twice a day  Norco 7.5/3.5 q.4 daily.    Prevacid 30 mg b.i.d. daily.    Lidoderm patch to the right knee.--Of note she is not really taking this because of financial issues    Robaxin 750 mg q.6 p.r.n.     Zofran q.6 p.r.n.     Paxil40 mg q.d.    K-dur 20 q.d.    Pravachol 10 q.d.     Restasis ophthalmic 1 drop b.i.d.    Sodium chloride nasal spray 1 spray, each nostril, b.i.d.    Ambien CR 12 mg by mouth daily at bedtime  Plaquenil 200 mg QD   Simethicone 80 mg 4 times a day when necessary   I note that aspirin and simvastatin were discontinued.    SOCIAL HISTORY:                HOUSING:  The patient lived in her own apartment.  There is apparently a four-stair entrance stairway that she needs to manage.   FUNCTIONAL STATUS:  She has in-home support which she pays for privately, according to her.    REVIEW OF SYSTEMS: In general does not complain of fever chills. --   Skin- Currently does not complain of rashes or itching  Head ears eyes nose mouth and throat Does not complain of nasal congestion discharge or sore throat or visual changes this evening       CHEST/RESPIRATORY:  --Does not complain of shortness of breath has occasional cough CARDIAC:   No chest pain.     GI:   Is not complaining of nausea vomiting diarrhea  constipation or abdominal discomfort today   GU does not complain of suprapubic discomfort or dysuria.   MUSCULOSKELETAL:  Is complaining of some left foot continued discomfort   NeurologicSome previous history of complaints of neuropathic pain does not complain of numbness  or dizziness currently.--  Psych-does have a history of anxiety this point appears to be relatively stable  PHYSICAL EXAMINATION:   Temperature 98.1 pulse 60 respirations 18 blood pressure 113/53 weight appears to be stable at 215 s Gen.  pleasant elderly female in no distress    Skin is warm and dry.--She does have makeup applied    Eyes pupils appear reactive light sclera and conjunctiva are clear visual acuity appears intact.    Oropharynx is clear mucous membranes moist--             CHEST/RESPIRATORY:  Clear air entry bilaterally. No labored breathing   CARDIOVASCULAR:   CARDIAC:   Heart sounds are normal rate and rhythm.  There are no murmurs.  She appears to be euvolemic--I would say minimal lower extremity edema. Pedal pulses intact   GASTROINTESTINAL: Abdomen is obese soft non tender-bowel sounds are positive--  L  GENITOURINARY:  BLADDER:   I did not note any bladder tenderness or overt distention MUSCULOSKELETAL:   EXTREMITIES:   BILATERAL LOWER EXTREMITIES:    Moves both at baseline strength-she has minimal lower extremity edema  bilaterally  I do not note any  acute deformities of the left foot other than possibly some slight inversion there is some mild edema of the knees bilaterally chronic arthritic changes There is some mild tenderness to palpation of the left foot in the arch area-I do see the steroid injection site which appears to have a small bruise this does not really appear to be anything abnormal      Strength upper extremities bilaterally appears grossly intact again possibly since mild right-sided weakness but this continues to be minimal  t-.  Neuro I could not  really appreciate any overt lateralizing findings she does have a history of CVA her speech is clear cranial nerves appear intact. Does have a history of fairly mild right-sided weakness --minimal deficit      .    Psych-she is alert and oriented pleasant and appropriate.  Labs  10/12/2015.  WBC 9.0 hemoglobin 11.7 platelets 382.  Sodium 131 potassium 4.2 BUN 10 creatinine 0.81.    09/29/2015.  Uric acid 4.8.  08/20/2015.  Sodium 131 potassium 4.1 BUN 14 creatinine 0.7.  WBC 8.2 hemoglobin 12.4 platelets 491.  TSH-1.014.  08/15/2015.  Liver function tests within normal limits.  Hemoglobin A1c 6.1.  Vitamin B12 345.    07/23/2015.  Chol 185-triglycerides 218-HDL 45-LDL 96.  Vitamin D 28.7.  07/18/2015.  Sodium 131 potassium 3.5 BUN 8 creatinine 0.75.  Calcium 8.5-ALT 12-otherwise liver function tests within normal limits.  WBC 10.2 hemoglobin 11.8 platelets 486  Jan 23d 2017.  ESR 18.  C-reactive 1.4 mildly elevated.  Vitamin D 22.2.  06/03/2016  Sodium 131 potassium 4.1 BUN 7 creatinine 0.73.  WBC 11.4 hemoglobin 11.6 platelets 387   05/25/2015.  Sodium 132  05/24/2015.  Sodium 134 potassium 4.6 BUN 9 creatinine 0.74.    04/10/2015.  Sodium 129 potassium 3.7 BUN 9 creatinine 0.85.  ALT 11 otherwise liver function tests within normal limits.  WBC 14.0 hemoglobin 11.6 platelets 398   02/15/2015.  Sodium 132 potassium 4 BUN 7 creatinine 0.7 to.  WBC 8.9 hemoglobin 12.4 platelets 371.    02/12/2014-vitamin D level XXIV.5 this appears to be rising  January 18 2015.  Sodium 132 potassium 4.2 BUN 9 creatinine 0.68.  WBC 8.0 hemoglobin 12.2 platelets 380  12/20/2014.  Sodium 131 potassium 4.1 BUN 9 creatinine 0.71.  Calcium 8.8.  12/08/2014.  Vitamin D 22.6.  12/04/2014.  WBC 8.9 hemoglobin 11.1 platelets 377.  W in 3.1  AST 13-ALT 12 otherwise liver function tests within normal limits.  Cholesterol  183-triglycerides 269-HDL 47-LDL 82         .     ASSESSMENT/PLAN:  Allergy symptoms-  She is asymptomatic this evening continues on Claritin routinely this appears to be helping      History of guaiac-positive stool-this is a 1 time positive reading-her hemoglobin has been stable she is on a proton pump inhibitor--as noted above-- she does not want a GI workup at this time--will update her CBC hemoglobin showed stability at 11.7 on lab done in 10/12/2015    chronic pain muscle spasms -- rheumatoid arthritis-and diagnosis of fibromyalgia ---patient is on a significant dose of Vicodin as noted above- We have increased her Robaxin 750 mg every 6 hours previously apparently this has helped some-Plaquenil has been restarted as well.  Most recently she is complaining of some left foot discomfort thought secondary to fasciitis per podiatry assessment-she did receive a steroid injection but still complains of some discomfort there is also a recommendation for her night splint this will have to be encouraged.  I did discuss patient's pain medications with her-we are concerned about increasing her pain medication secondary to them being fairly extensive with the Vicodin 7.5/325 milligrams every 4 hours when necessary-Robaxin 750 mg every 6 hours when necessary-she is also receiving Xanax at night as well as Ambien for sleep-would be concerned about increasing her pain medication at night she says this is when the foot pain is most prominent.  We did discuss alternatives and she is willing to try icy hot patch-again she does not want the Lidoderm patch secondary to cost but it appears the icy hot is more affordable and does have some lidocaine in it we will give this a try topically .       In regards hyperlipidemia this appears stable with cholesterol 185 HDL 45 LDL 96 she prefers to be off the statin--she feels it may have concluded to her generalized pain-      anxiety She is now  receiving Xanax 3 times a day routinely this appears to be fairly well controlled     .    History of remote left thalamic CVA.  Very little residual of this she continues on Plavix--.        History of metatarsal fractures bilaterally  This is been relatively stable again she does complain of left foot pain as noted above            Hyponatremia and hypokalemia.  The etiology here is unclear. But appears to be stable during her stay here --sodium generally runs high 120s to low 130s --most recently   131 we will update this    History of rheumatoid arthritis, fibromyalgia and reflex sympathetic dystrophy, according to the patient.  She has chronic pain issues, medication changes as noted above-Plaquenil has been restarted--she is followed by rheumatology as well                                                                                 Hypertension-  This appears to be improving continues on Microzide 12.5 milligrams a day and Lopressor 25 mg  twice a day-at this point will monitor since blood pressures appear be proving    GERD this appears stable on PPIshe is no longer complaining symptoms with this will have to be monitored.       Depression-at this point is stableon Paxil .                             Insomnia she is on Ambien this was increased secondary patient requests saying the lower dose of Ambien was not effective she appears to have tolerated this well although will have to be watched--      vitamin D deficiency-she is currently on supplementation Will update a vitamin D level      [id number]- Of note greater than 40 minutes spent assessing patient-reviewing her chart including labs previous progress notes-addressing her concerns -and coordinating and formulating a plan of care for numerous diagnoses-of note greater than 50% of time spent coordinating plan of care

## 2015-11-07 ENCOUNTER — Encounter (HOSPITAL_COMMUNITY)
Admission: RE | Admit: 2015-11-07 | Discharge: 2015-11-07 | Disposition: A | Discharge status: Home / Self Care | Payer: Medicare Other | Source: Skilled Nursing Facility | Attending: *Deleted | Admitting: *Deleted

## 2015-11-07 DIAGNOSIS — M069 Rheumatoid arthritis, unspecified: Secondary | ICD-10-CM | POA: Diagnosis not present

## 2015-11-07 LAB — CBC WITH DIFFERENTIAL/PLATELET
BASOS ABS: 0.1 10*3/uL (ref 0.0–0.1)
BASOS PCT: 1 %
EOS ABS: 0.1 10*3/uL (ref 0.0–0.7)
EOS PCT: 2 %
HCT: 37.4 % (ref 36.0–46.0)
Hemoglobin: 12.5 g/dL (ref 12.0–15.0)
Lymphocytes Relative: 34 %
Lymphs Abs: 3.3 10*3/uL (ref 0.7–4.0)
MCH: 30.9 pg (ref 26.0–34.0)
MCHC: 33.4 g/dL (ref 30.0–36.0)
MCV: 92.6 fL (ref 78.0–100.0)
Monocytes Absolute: 1 10*3/uL (ref 0.1–1.0)
Monocytes Relative: 10 %
Neutro Abs: 5.1 10*3/uL (ref 1.7–7.7)
Neutrophils Relative %: 53 %
PLATELETS: 423 10*3/uL — AB (ref 150–400)
RBC: 4.04 MIL/uL (ref 3.87–5.11)
RDW: 13.3 % (ref 11.5–15.5)
WBC: 9.6 10*3/uL (ref 4.0–10.5)

## 2015-11-07 LAB — COMPREHENSIVE METABOLIC PANEL
ALT: 13 U/L — AB (ref 14–54)
AST: 13 U/L — AB (ref 15–41)
Albumin: 3.7 g/dL (ref 3.5–5.0)
Alkaline Phosphatase: 70 U/L (ref 38–126)
Anion gap: 6 (ref 5–15)
BUN: 12 mg/dL (ref 6–20)
CHLORIDE: 91 mmol/L — AB (ref 101–111)
CO2: 31 mmol/L (ref 22–32)
CREATININE: 0.65 mg/dL (ref 0.44–1.00)
Calcium: 8.9 mg/dL (ref 8.9–10.3)
GFR calc Af Amer: >60 mL/min (ref >60)
GFR calc non Af Amer: >60 mL/min (ref >60)
Glucose, Bld: 103 mg/dL — ABNORMAL HIGH (ref 65–99)
POTASSIUM: 5 mmol/L (ref 3.5–5.1)
SODIUM: 128 mmol/L — AB (ref 135–145)
Total Bilirubin: 0.4 mg/dL (ref 0.3–1.2)
Total Protein: 7 g/dL (ref 6.5–8.1)

## 2015-11-08 LAB — VITAMIN D 25 HYDROXY (VIT D DEFICIENCY, FRACTURES): VIT D 25 HYDROXY: 29.3 ng/mL — AB (ref 30.0–100.0)

## 2015-11-14 ENCOUNTER — Encounter: Payer: Self-pay | Admitting: Internal Medicine

## 2015-11-14 ENCOUNTER — Encounter (HOSPITAL_COMMUNITY)
Admission: AD | Admit: 2015-11-14 | Discharge: 2015-11-14 | Disposition: A | Discharge status: Home / Self Care | Payer: Medicare Other | Source: Skilled Nursing Facility | Attending: Internal Medicine | Admitting: Internal Medicine

## 2015-11-14 ENCOUNTER — Non-Acute Institutional Stay (SKILLED_NURSING_FACILITY): Payer: Medicare Other | Admitting: Internal Medicine

## 2015-11-14 DIAGNOSIS — I10 Essential (primary) hypertension: Secondary | ICD-10-CM | POA: Insufficient documentation

## 2015-11-14 DIAGNOSIS — E876 Hypokalemia: Secondary | ICD-10-CM | POA: Diagnosis present

## 2015-11-14 DIAGNOSIS — E109 Type 1 diabetes mellitus without complications: Secondary | ICD-10-CM | POA: Diagnosis not present

## 2015-11-14 DIAGNOSIS — S92351D Displaced fracture of fifth metatarsal bone, right foot, subsequent encounter for fracture with routine healing: Secondary | ICD-10-CM | POA: Diagnosis not present

## 2015-11-14 DIAGNOSIS — M797 Fibromyalgia: Secondary | ICD-10-CM | POA: Insufficient documentation

## 2015-11-14 DIAGNOSIS — M069 Rheumatoid arthritis, unspecified: Secondary | ICD-10-CM | POA: Diagnosis not present

## 2015-11-14 DIAGNOSIS — G894 Chronic pain syndrome: Secondary | ICD-10-CM

## 2015-11-14 DIAGNOSIS — F419 Anxiety disorder, unspecified: Secondary | ICD-10-CM | POA: Diagnosis present

## 2015-11-14 DIAGNOSIS — I6990 Unspecified sequelae of unspecified cerebrovascular disease: Secondary | ICD-10-CM | POA: Diagnosis present

## 2015-11-14 DIAGNOSIS — E785 Hyperlipidemia, unspecified: Secondary | ICD-10-CM | POA: Insufficient documentation

## 2015-11-14 LAB — BASIC METABOLIC PANEL
Anion gap: 8 (ref 5–15)
BUN: 14 mg/dL (ref 6–20)
CALCIUM: 8.7 mg/dL — AB (ref 8.9–10.3)
CHLORIDE: 91 mmol/L — AB (ref 101–111)
CO2: 29 mmol/L (ref 22–32)
CREATININE: 0.83 mg/dL (ref 0.44–1.00)
GFR calc non Af Amer: >60 mL/min (ref >60)
Glucose, Bld: 105 mg/dL — ABNORMAL HIGH (ref 65–99)
Potassium: 5.1 mmol/L (ref 3.5–5.1)
SODIUM: 128 mmol/L — AB (ref 135–145)

## 2015-11-14 NOTE — Progress Notes (Deleted)
Location:  [facility] Room Number: 158/P Place of Service:  SNF (31) Provider:  Amado Coe., MD  Patient Care Team: Redmond [employer], MD as PCP - General (Internal Medicine)  Extended Emergency Contact Information Primary Emergency Contact: [facility] Address: [address]          [place], [place] [address] Robin Blackburn of [place] Phone: [phone] Mobile Phone: [phone] Relation: Brother  Code Status:  Full Code Goals of care: Advanced Directive information Advanced Directives 11/14/2015  Does patient have an advance directive? Yes  Type of Advance Directive -  Does patient want to make changes to advanced directive? No - Patient declined  Copy of advanced directive(s) in chart? Yes     Chief Complaint  Patient presents with  . Discharge Note    HPI:  Pt is a 70 y.o. female seen today for an acute visit for    Past Medical History  Diagnosis Date  . Neuropathy ([place])   . Hypertension   . Anxiety   . Depression   . Acid reflux   . Rheumatoid arthritis ([place])   . Fibromyalgia   . Stroke ([place]) 02/17/14  . CVA (cerebral infarction) 02/19/2014    Acute left thalamic   Past Surgical History  Procedure Laterality Date  . Back surgery    . Knee surgery    . Ankle reconstruction    . Abdominal hysterectomy    . Appendectomy    . Cholecystectomy      Allergies  Allergen Reactions  . Demerol [Meperidine] Anaphylaxis  . Hydromorphone Anaphylaxis  . Bisphosphonates     GI intolerance  . Ciprofloxacin   . Iohexol   . Levaquin [Levofloxacin In D5w]   . Penicillins   . Percocet [Oxycodone-Acetaminophen]   . Shellfish Allergy     Glucosamine not an option  . Sulfa Antibiotics   . Terramycin [Oxytetracycline]   . Morphine And Related Rash    Current Outpatient Prescriptions on File Prior to Visit  Medication Sig Dispense Refill  . acetaminophen (TYLENOL) 325 MG tablet Take 650 mg by mouth daily. May give a  dose of Hydrocodone/APAP 7.5/5 prn along with this if needed    . ALPRAZolam (XANAX) 1 MG tablet Take 1 mg by mouth 3 (three) times daily.    . calcium-vitamin D (OSCAL WITH D) 500-200 MG-UNIT tablet Take 1 tablet by mouth 2 (two) times daily.    . Carbamide Peroxide (MURINE EAR OT) Earache relief, 1 drop each ear ; both ears as needed for earache PRN    . Cholecalciferol (VITAMIN D3) 2000 units TABS Take by mouth daily.    . clopidogrel (PLAVIX) 75 MG tablet Take 75 mg by mouth at bedtime.    [name] Kitchen desloratadine (CLARINEX) 5 MG tablet Take 5 mg by mouth daily.    [name] Kitchen Dextromethorphan-Guaifenesin (ROBITUSSIN COUGH/CHEST DM MAX) 10-200 MG/5ML LIQD 15 cc every 6 hours as needed for cough and congestion    . diclofenac sodium (VOLTAREN) 1 % GEL Apply 3 g topically 3 (three) times daily. Apply to large joints as directed    . dicyclomine (BENTYL) 20 MG tablet Take 20 mg by mouth every 6 (six) hours as needed. Abdominal cramps    . diphenhydrAMINE (BENADRYL) 12.5 MG chewable tablet Chew 12.5 mg by mouth every 6 (six) hours as needed for itching or allergies.    [name] Kitchen docusate sodium (COLACE) 100 MG capsule Take 100 mg by mouth 2 (two) times daily as needed  for mild constipation.     [name] Kitchen HYDROcodone-acetaminophen (NORCO) 7.5-325 MG tablet Take 1 tablet by mouth every 4 (four) hours as needed for moderate pain. 120 tablet 0  . hydrocortisone (ANUSOL-HC) 2.5 % rectal cream Place 1 application rectally daily as needed for hemorrhoids or itching.    . hydroxychloroquine (PLAQUENIL) 200 MG tablet Take 200 mg by mouth daily.     . lansoprazole (PREVACID) 30 MG capsule Take 30 mg by mouth 2 (two) times daily.    [name] Kitchen lidocaine (LIDODERM) 5 % Place 1 patch onto the skin every 12 (twelve) hours as needed. Remove & Discard patch within 12 hours or as directed by MD    . loperamide (IMODIUM A-D) 2 MG tablet Take 2 mg by mouth 3 (three) times daily as needed for diarrhea or loose stools.     . magic mouthwash SOLN Take 5 mLs by  mouth every 6 (six) hours as needed for mouth pain.    . Menthol, Topical Analgesic, (BIOFREEZE EX) Apply 1 application topically 2 (two) times daily.    . methocarbamol (ROBAXIN) 750 MG tablet Take 750 mg by mouth every 6 (six) hours as needed for muscle spasms.    . metoprolol tartrate (LOPRESSOR) 25 MG tablet Take 1 tablet (25 mg total) by mouth 2 (two) times daily. 60 tablet 2  . ondansetron (ZOFRAN) 4 MG tablet Take 4 mg by mouth every 6 (six) hours as needed for nausea or vomiting.    . ondansetron (ZOFRAN) 4 MG tablet Give with plaquenil daily at 2 pm    . PARoxetine (PAXIL) 20 MG tablet Take 40 mg by mouth daily.     [name] Kitchen Phenylephrine-DM-GG-APAP (TYLENOL COLD/FLU SEVERE PO) Take by mouth. 30 ml by mouth Q 6 hrs. As needed at bedtime    . Propylene Glycol 0.6 % SOLN Apply 1 drop to eye daily.    . RESTASIS 0.05 % ophthalmic emulsion Place 1 drop into both eyes 2 (two) times daily.    . sodium chloride (OCEAN) 0.65 % SOLN nasal spray Place 1 spray into both nostrils as needed for congestion.    [name] Kitchen zolpidem (AMBIEN CR) 12.5 MG CR tablet Take 1 tablet (12.5 mg total) by mouth at bedtime as needed for sleep. 30 tablet 2   No current facility-administered medications on file prior to visit.     Review of Systems   There is no immunization history on file for this patient. Pertinent  Health Maintenance Due  Topic Date Due  . PNA vac Low Risk Adult (1 of 2 - PCV13) 11/13/2016 (Originally 06/14/2010)  . MAMMOGRAM  11/13/2017 (Originally 06/15/1995)  . COLONOSCOPY  11/13/2025 (Originally 06/15/1995)  . INFLUENZA VACCINE  01/08/2016  . DEXA SCAN  Completed   No flowsheet data found. Functional Status Survey:    Filed Vitals:   11/14/15 1245  BP: 113/53  Pulse: 60  Temp: 98.1 F (36.7 C)  TempSrc: Oral  Resp: 18  Height: 5\' 6"  (1.676 m)  Weight: 211 lb 9.6 oz (95.981 kg)  SpO2: 98%   Body mass index is 34.17 kg/(m^2). Physical Exam  Labs reviewed:  Recent Labs  10/12/15 0815  11/07/15 0630 11/14/15 0630  NA 131* 128* 128*  K 4.2 5.0 5.1  CL 92* 91* 91*  CO2 29 31 29   GLUCOSE 108* 103* 105*  BUN 10 12 14   CREATININE 0.81 0.65 0.83  CALCIUM 8.6* 8.9 8.7*    Recent Labs  07/18/15 0700 08/15/15 0900 11/07/15 0630  AST 18 16 13*  ALT 12* 15 13*  ALKPHOS 72 83 70  BILITOT 0.5 0.6 0.4  PROT 6.8 7.3 7.0  ALBUMIN 3.6 3.7 3.7    Recent Labs  08/15/15 0900 10/12/15 0815 11/07/15 0630  WBC 8.2 9.0 9.6  NEUTROABS 3.9 4.3 5.1  HGB 12.4 11.7* 12.5  HCT 37.8 35.2* 37.4  MCV 91.3 91.7 92.6  PLT 491* 382 423*   Lab Results  Component Value Date   TSH 1.014 08/15/2015   Lab Results  Component Value Date   HGBA1C 6.1* 08/15/2015   Lab Results  Component Value Date   CHOL 185 07/23/2015   HDL 45 07/23/2015   LDLCALC 96 07/23/2015   TRIG 218* 07/23/2015   CHOLHDL 4.1 07/23/2015    Significant Diagnostic Results in last 30 days:  No results found.  Assessment/Plan There are no diagnoses linked to this encounter.      Oralia Manis, [place]

## 2015-11-14 NOTE — Progress Notes (Signed)
Patient ID: Robin Blackburn, female   DOB: [birth year], 70 y.o.   MRN: [id number]                     .   . This is adischarge note  ACILITY: [facility]:   SNF   CHIEF COMPLAINT: Discharge note      HISTORY OF PRESENT ILLNESS:  This is a patient whom we actually had in the building for a couple stays-originally.  she had suffered an acute thalamic infarct and, after her arrival here, we discovered her to have a fracture of the right fifth metatarsal for which she was referred to Orthopedics.  She was discharged to her own apartment which apparently has four stairs to enter.         She was discovered during most recent  admission to have a minimally displaced fracture of the left fifth metatarsal and, as noted, has the original fracture that we diagnosed about a year ago---.  These issues have been relatively stable and followed by orthopedics----we did x-ray her left foot and ankle area recently secondary complaints of increased pain however did not really show any acute changes--Dr. Linna Darner did order podiatry consult which was recently completed-she was diagnosed with fasciitis with inflammation and recommendation was for a night splint.  She also received a steroid injection.   She is on numerous pain medications including Vicodin 7.5-325 mg every 4 hours when necessary-Robaxin 750 mg every 6 hours when necessary-she does have an order for Lidoderm patch apparently there are financial issues and she has not really been using them--we have ordered an icy hot patch which has Lidoderm and that she is not really complaining of pain today        has seen her rheumatologist  in the past who has prescribed  Plaqueni--and she has been taking this as well recently Rheumatologist did order a bone density test as well which showed osteoporosis with a T score of 3 of the left femur  --she is on calcium supplementation she has numerous allergies to buy  phosphonate          Was also noted about 6 months ago she did have a guaiac positive stool  others have been negative-she also has a history of hemorrhoids was thought possibly this may have been a bleeding hemorrhoid which contributed to the one positive reading-her hemoglobin actually stable to somewhat improved-she is on a proton pump inhibitor I -- she does not at this point want any GI workup-she feels this was a bleeding hemorrhoid and does not want any workup for now--- most recent hemoglobin done today shows stability at 12.5     Patient does have a history of anxiety she is on Xanax 1 mg 3 times a day  In regards to depression she is on Paxil which appears to be stable as well.  She does have a history of hypertension she is on Microzide In Dr. Linna Darner recently started Lopressor 25 mg twice a day this appears to have be having a beneficial effect Blood pressures appear to have normalize recently-    She  also complained of some bilateral hearing loss she feels this has been essentially since she had her CVA- We did give her a course of De Brox for some earwax issues-however during loss persisted and she has actually seen an ENT who actually manually disimpacted some earwax --   Respiratory wise she does not  have any complaints this evening she is on Claritin for history of allergies-has in the past received Mucinex for cough but she is not complaining of thattoday   Patient will be discharged home apparently later this week or early next week-she will need continued PT and OT since she largely ambulates in a wheelchair significant lower extremity weakness as noted above with history of fractures.  She will need some support from home health at home and this is being arranged which means her discharge is possibly delayed until her help at home is finalized.         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      Fracture of the right fifth metatarsal in September, now with fracture of the left fifth metatarsal.    Physical deconditioning.    Essential hypertension.    Neuropathy.     Hyponatremia.    Hypokalemia.    History of rheumatoid arthritis, fibromyalgia and reflex sympathetic dystrophy, according to the patient.  Hyperlipidemia.     CURRENT MEDICATIONS:  Medication list is reviewed.            Xanax 1 mg tablet TID.     Plavix 75 q.d.       Bentyl 20 q.6 hours p.r.n., abdominal cramps.    Lomotil 1 tablet two times daily as needed for diarrhea or loose stool.     Microzide 12.5 daily.    Metoprolol 25 mg twice a day  Norco 7.5/3.5 q.4 daily.    Prevacid 30 mg b.i.d. daily.    Lidoderm patch to the right knee.--Of note she is not really taking this because of financial issues    She has been prescribed icy hot patch lidocaine 4% once a day  Robaxin 750 mg q.6 p.r.n.     Zofran q.6 p.r.n.     Paxil40 mg q.d.    K-dur 20 q.d.    Pravachol 10 q.d.     Restasis ophthalmic 1 drop b.i.d.    Sodium chloride nasal spray 1 spray, each nostril, b.i.d.    Ambien CR 12 mg by mouth daily at bedtime  Plaquenil 200 mg QD   Simethicone 80 mg 4 times a day when necessary   I note that aspirin and simvastatin were discontinued.    SOCIAL HISTORY:                HOUSING:  The patient lived in her own apartment.  There is apparently a four-stair entrance stairway that she needs to manage.   FUNCTIONAL STATUS:  She has in-home support which she pays for privately, according to her.    REVIEW OF SYSTEMS: In general does not complain of fever chills. --   Skin- Currently does not complain of rashes or itching  Head ears eyes nose mouth and throat Does not complain of nasal congestion discharge or sore throat or visual changes        CHEST/RESPIRATORY:  --Does not complain of shortness of breath has occasional cough CARDIAC:   No  chest pain.     GI:   Is not complaining of nausea vomiting diarrhea constipation or abdominal discomfort today   GU does not complain of suprapubic discomfort or dysuria.   MUSCULOSKELETAL:  I is not complaining of left foot discomfort today-has complained of this the past   NeurologicSome previous history of  complaints of neuropathic pain does not complain of numbness  or dizziness currently.--  Psych-does have a history of anxiety this point appears to be relatively stable      PHYSICAL EXAMINATION:  She is afebrile pulse is 62 respirations of 17 blood pressures of been stable 113/53 appears to be relatively baseline s Gen.  pleasant elderly female in no distress    Skin is warm and dry.--She does have makeup applied    Eyes pupils appear reactive light sclera and conjunctiva are clear visual acuity appears intact.    Oropharynx is clear mucous membranes moist--             CHEST/RESPIRATORY:  Clear air entry bilaterally. No labored breathing   CARDIOVASCULAR:   CARDIAC:   Heart sounds are normal rate and rhythm.  There are no murmurs.  She appears to be euvolemic--I would say minimal lower extremity edema. GASTROINTESTINAL: Abdomen is obese soft non tender-bowel sounds are positive--  L  GENITOURINARY:  BLADDER:   I did not note any bladder tenderness or overt distention MUSCULOSKELETAL:   EXTREMITIES:   BILATERAL LOWER EXTREMITIES:    Moves both at baseline strength-she has minimal lower extremity edema  bilaterally  I do not note any  acute deformities of the left foot other than possibly some slight inversion there is some mild edema of the knees bilaterally chronic arthritic changes        Strength upper extremities bilaterally appears grossly intact again possibly since mild right-sided weakness but this continues to be minimal  t-.  Neuro I could not really appreciate any overt lateralizing findings she does have a history of CVA her speech is clear  cranial nerves appear intact. Does have a history of fairly mild right-sided weakness --minimal deficit      .    Psych-she is alert and oriented pleasant and appropriate.  Labs  11/14/2015.  Sodium 128 potassium 5.1 BUN 14 creatinine 0.83.  11/07/2015.  WBC 9.6 hemoglobin 12.5 platelets 423.  AST 13 ALT 13-otherwise liver function tests within normal limits  10/12/2015.  WBC 9.0 hemoglobin 11.7 platelets 382.  Sodium 131 potassium 4.2 BUN 10 creatinine 0.81.    09/29/2015.  Uric acid 4.8.  08/20/2015.  Sodium 131 potassium 4.1 BUN 14 creatinine 0.7.  WBC 8.2 hemoglobin 12.4 platelets 491.  TSH-1.014.  08/15/2015.  Liver function tests within normal limits.  Hemoglobin A1c 6.1.  Vitamin B12 345.    07/23/2015.  Chol 185-triglycerides 218-HDL 45-LDL 96.  Vitamin D 28.7.  07/18/2015.  Sodium 131 potassium 3.5 BUN 8 creatinine 0.75.  Calcium 8.5-ALT 12-otherwise liver function tests within normal limits.  WBC 10.2 hemoglobin 11.8 platelets 486  Jan 23d 2017.  ESR 18.  C-reactive 1.4 mildly elevated.  Vitamin D 22.2.  06/03/2016  Sodium 131 potassium 4.1 BUN 7 creatinine 0.73.  WBC 11.4 hemoglobin 11.6 platelets 387   05/25/2015.  Sodium 132  05/24/2015.  Sodium 134 potassium 4.6 BUN 9 creatinine 0.74.    04/10/2015.  Sodium 129 potassium 3.7 BUN 9 creatinine 0.85.  ALT 11 otherwise liver function tests within normal limits.  WBC 14.0 hemoglobin 11.6 platelets 398   02/15/2015.  Sodium 132 potassium 4 BUN 7 creatinine 0.7 to.  WBC 8.9 hemoglobin 12.4 platelets 371.    02/12/2014-vitamin D level XXIV.5 this appears to be rising  January 18 2015.  Sodium 132 potassium 4.2 BUN 9 creatinine 0.68.  WBC 8.0 hemoglobin 12.2 platelets 380  12/20/2014.  Sodium 131 potassium 4.1  BUN 9 creatinine 0.71.  Calcium 8.8.  12/08/2014.  Vitamin D 22.6.  12/04/2014.  WBC 8.9 hemoglobin 11.1 platelets 377.  W in  3.1 AST 13-ALT 12 otherwise liver function tests within normal limits.  Cholesterol 183-triglycerides 269-HDL 47-LDL 82         .     ASSESSMENT/PLAN:  Allergy symptoms-  She is asymptomatic  continues on Claritin routinely this appears to be helping      History of guaiac-positive stool-this is a 1 time positive reading-her hemoglobin has been stable she is on a proton pump inhibitor--as noted above-- she does not want a GI workup at this time--hemoglobin continues to be stable most recently 12.5 on lab done 11/07/2015    chronic pain muscle spasms -- rheumatoid arthritis-and diagnosis of fibromyalgia ---patient is on a significant dose of Vicodin as noted above- We have increased her Robaxin 750 mg every 6 hours previously apparently this has helped some-Plaquenil has been restarted as well.  Most recently she is complaining of some left foot discomfort thought secondary to fasciitis per podiatry assessment-she did receive a steroid injection  She also has been started on ICy hot with Lidoderm patch-she is not really complaining of foot pain this afternoon  -we are concerned about increasing her pain medication secondary to them being fairly extensive with the Vicodin 7.5/325 milligrams every 4 hours when necessary-Robaxin 750 mg every 6 hours when necessary-she is also receiving Xanax at night as well as Ambien for sleep-would be concerned about increasing her pain medication    .       In regards hyperlipidemia this appears stable with cholesterol 185 HDL 45 LDL 96 she prefers to be off the statin--she feels it may have concluded to her generalized pain-      anxiety She is now receiving Xanax 3 times a day routinely this appears to be fairly well controlled     .    History of remote left thalamic CVA.  Very little residual of this she continues on Plavix--.        History of metatarsal fractures bilaterally  This has been relatively stable -pain  issues as noted above            Hyponatremia and hypokalemia.  The etiology here is unclear. But appears to be stable during her stay here --sodium generally runs high 120s to low 130s --most recently 128 on lab done today-this will need follow-up by primary care provider    History of rheumatoid arthritis, fibromyalgia and reflex sympathetic dystrophy, according to the patient.  She has chronic pain issues, medication changes as noted above-Plaquenil has been restarted--she is followed by rheumatology as well                                                                                 Hypertension-  This appears to be improving continues on Microzide 12.5 milligrams a day and Lopressor 25 mg twice a day-    GERD this appears stable on PPIshe is no longer complaining symptoms with this will have to be monitored.       Depression-at this point is stableon Paxil .  Insomnia she is on Ambien this was increased secondary patient requests saying the lower dose of Ambien was not effective she appears to have tolerated this well although will have to be watched--      vitamin D deficiency-she is currently on supplementation vitamin D level was recently was 29.3-supplementation has been increased  Again she will be going home within the next week there waiting on home health support-before she goes home she appears to be relatively stable has been a long-term resident of this facility but apparently financial issues have precipitated her move back home   [id number]-of note greater than 30 minutes preparing this discharge summary-greater than 50% of time spent coordinating plan of care for numerous diagnoses

## 2015-11-15 ENCOUNTER — Other Ambulatory Visit: Payer: Self-pay

## 2015-11-15 MED ORDER — HYDROCODONE-ACETAMINOPHEN 7.5-325 MG PO TABS: 1.0000 | ORAL_TABLET | ORAL | Status: DC | PRN

## 2015-11-15 NOTE — Telephone Encounter (Signed)
Rx faxed to [facility] at [phone].   [address] [place], [place] [address]  Phone #: [phone] 

## 2015-11-21 ENCOUNTER — Non-Acute Institutional Stay (SKILLED_NURSING_FACILITY): Payer: Medicare Other | Admitting: Internal Medicine

## 2015-11-21 ENCOUNTER — Encounter: Payer: Self-pay | Admitting: Internal Medicine

## 2015-11-21 DIAGNOSIS — E871 Hypo-osmolality and hyponatremia: Secondary | ICD-10-CM

## 2015-11-21 NOTE — Progress Notes (Signed)
Patient ID: [name], female   DOB: 12/02/1945, 70 y.o.   MRN: [id number]                     .   . This is an acute visit prior to discharge  ACILITY: [facility]:   SNF   CHIEF COMPLAINT: Acute visit follow-up hypernatremia-anticipating discharge      HISTORY OF PRESENT ILLNESS:   Patient is a pleasant female who is being discharged from nursing facility please see discharge note on 11/12/2015.  She's had a chronic issue of hyponatremia which has been relatively stable during her stay here with sodiums running between 128 and 131.  This will need expedient follow-up by primary care provider and I have discussed this with her.  Most recent lab shows a sodium of 128  baseline has run between the high 20s and low 130s.  Dr. Linna Darner did discontinue her hydrochlorothiazide and has started her on Aldactone and instead-sodium remains essentially unchanged however.  Have reviewed up-to-date which shows SSRIs methotrexate opiates can be potential causes-.  She does have a history of numerous medications-slow titration down of some of these medications I suspect will be warranted if hyponatremia progresses--this will need evaluation by primary care provider.  Clinically she appears to be at her baseline--she did ask about symptoms of hyponatremia and I did discuss possible changes including headaches poor balance nausea muscle weakness cramping and spasms-she does not really have significant increase of any of the symptoms and has not during her stay here.  Certainly serious hyponatremia could result in increased confusion seizures,.  I did relate that she has tolerated her somewhat chronic hyponatremia well and if there is an acute change in sodium  she would be more likely to be symptomatic and she expressed understanding.  This will need expedient follow-up by primary care provider-and she is aware of  this.        .         .     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      Fracture of the right fifth metatarsal in September, now with fracture of the left fifth metatarsal.    Physical deconditioning.    Essential hypertension.    Neuropathy.     Hyponatremia.    Hypokalemia.    History of rheumatoid arthritis, fibromyalgia and reflex sympathetic dystrophy, according to the patient.  Hyperlipidemia.     CURRENT MEDICATIONS:  Medication list is reviewed.            Xanax 1 mg tablet TID.     Plavix 75 q.d.       Bentyl 20 q.6 hours p.r.n., abdominal cramps.    Lomotil 1 tablet two times daily as needed for diarrhea or loose stool.     Microzide 12.5 daily.    Metoprolol 25 mg twice a day  Norco 7.5/3.5 q.4 daily.    Prevacid 30 mg b.i.d. daily.    Lidoderm patch to the right knee.--Of note she is not really taking this because of financial issues    She has been prescribed icy hot patch lidocaine 4% once a day  Robaxin 750 mg q.6 p.r.n.     Zofran q.6 p.r.n.     Paxil40 mg q.d.    Dactyl and 25 mg by mouth daily  Pravachol 10 q.d.     Restasis ophthalmic 1 drop b.i.d.    Sodium chloride nasal spray 1 spray, each nostril, b.i.d.    Ambien CR 12 mg by mouth daily at bedtime  Plaquenil 200 mg QD   Simethicone 80 mg 4 times a day when necessary   I note that aspirin and simvastatin were discontinued.    SOCIAL HISTORY:                HOUSING:  The patient lived in her own apartment.  There is apparently a four-stair entrance stairway that she needs to manage.   FUNCTIONAL STATUS:  She has in-home support which she pays for privately, according to her.    REVIEW OF SYSTEMS: In general does not complain of fever chills. --   Skin- Currently does not complain of rashes or itching  Head ears eyes nose mouth and throat Does not complain of nasal  congestion discharge or sore throat or visual changes        CHEST/RESPIRATORY:  --Does not complain of shortness of breath has occasional cough CARDIAC:   No chest pain.     GI:   Is not complaining of nausea vomiting diarrhea constipation or abdominal discomfort today   GU does not complain of suprapubic discomfort or dysuria.   MUSCULOSKELETAL:  I is not complaining of left foot discomfort today-has complained of this the past   NeurologicSome previous history of complaints of neuropathic pain does not complain of numbness  or dizziness currently.--  Psych-does have a history of anxiety this point appears to be relatively stable      PHYSICAL EXAMINATION:  Pressure 98.1 pulse 78 respirations 18 blood pressure 148/78-113/53 most recently  s Gen.  pleasant elderly female in no distress    Skin is warm and dry.--She does have makeup applied    Eyes pupils appear reactive light sclera and conjunctiva are clear visual acuity appears intact.    Oropharynx is clear mucous membranes moist--             CHEST/RESPIRATORY:  Clear air entry bilaterally. No labored breathing   CARDIOVASCULAR:   CARDIAC:   Heart sounds are normal rate and rhythm.  There are no murmurs.  She appears to be euvolemic--minimal lower extremity edema. GASTROINTESTINAL: Abdomen is obese soft non tender-bowel sounds are positive--  L  GENITOURINARY:  BLADDER:   I did not note any bladder tenderness or overt distention MUSCULOSKELETAL:   EXTREMITIES:   BILATERAL LOWER EXTREMITIES:    Moves both at baseline strength-she has minimal lower extremity edema  bilaterally  I do not note any  acute deformities of the left foot other than possibly some slight inversion there is some mild edema of the knees bilaterally chronic arthritic changes        Strength upper extremities bilaterally appears grossly intact again possibly since mild right-sided weakness but this continues to be minimal  t-.  Neuro I  could not really appreciate any overt lateralizing findings she does have a history of CVA her speech is clear cranial nerves appear intact. Does have a history of fairly mild right-sided weakness --minimal deficit      .    Psych-she is alert and oriented pleasant and appropriate.  Labs  11/14/2015.  Sodium 128 potassium 5.1 BUN 14 creatinine 0.83.  11/07/2015.  WBC 9.6 hemoglobin 12.5 platelets 423.  AST 13 ALT 13-otherwise liver function tests within normal limits  10/12/2015.  WBC 9.0 hemoglobin 11.7 platelets 382.  Sodium 131 potassium 4.2 BUN 10 creatinine 0.81.    09/29/2015.  Uric acid 4.8.  08/20/2015.  Sodium 131 potassium 4.1 BUN 14 creatinine 0.7.  WBC 8.2 hemoglobin 12.4 platelets 491.  TSH-1.014.  08/15/2015.  Liver function tests within normal limits.  Hemoglobin A1c 6.1.  Vitamin B12 345.    07/23/2015.  Chol 185-triglycerides 218-HDL 45-LDL 96.  Vitamin D 28.7.  07/18/2015.  Sodium 131 potassium 3.5 BUN 8 creatinine 0.75.  Calcium 8.5-ALT 12-otherwise liver function tests within normal limits.  WBC 10.2 hemoglobin 11.8 platelets 486  Jan 23d 2017.  ESR 18.  C-reactive 1.4 mildly elevated.  Vitamin D 22.2.  06/03/2016  Sodium 131 potassium 4.1 BUN 7 creatinine 0.73.  WBC 11.4 hemoglobin 11.6 platelets 387   05/25/2015.  Sodium 132  05/24/2015.  Sodium 134 potassium 4.6 BUN 9 creatinine 0.74.    04/10/2015.  Sodium 129 potassium 3.7 BUN 9 creatinine 0.85.  ALT 11 otherwise liver function tests within normal limits.  WBC 14.0 hemoglobin 11.6 platelets 398   02/15/2015.  Sodium 132 potassium 4 BUN 7 creatinine 0.7 to.  WBC 8.9 hemoglobin 12.4 platelets 371.    02/12/2014-vitamin D level XXIV.5 this appears to be rising             .     ASSESSMENT/PLAN:  History of hyponatremia chronic-with patient about about to be discharged---e-sodium appears relatively baseline at 128 per mostt  recent lab-have discussed this extensively with Dr. Linna Darner with recommendations as noted above-this will need expedient follow-up by primary care provider this was discussed with patient and I also discussed possible signs of worsening hyponatremia and she expressed understanding she does not really exhibit any significant symptoms at this time that one would attribute to low sodium. Again recommendation for slow titration down of multiple medications if hyponatremia progresses.  [id number] greater than 25 minutes spent assessing patient reviewing medications discussing status with Dr. Lenon Ahmadi medical literature-and coordinating plan of care-of note greater than 50% of time spent coordinating plan of care as noted above

## 2015-11-22 ENCOUNTER — Encounter (HOSPITAL_COMMUNITY)
Admission: AD | Admit: 2015-11-22 | Discharge: 2015-11-22 | Disposition: A | Discharge status: Home / Self Care | Payer: Medicare Other | Source: Skilled Nursing Facility | Attending: Internal Medicine | Admitting: Internal Medicine

## 2015-11-22 DIAGNOSIS — M069 Rheumatoid arthritis, unspecified: Secondary | ICD-10-CM | POA: Diagnosis not present

## 2015-11-22 LAB — BASIC METABOLIC PANEL
ANION GAP: 6 (ref 5–15)
BUN: 13 mg/dL (ref 6–20)
CALCIUM: 8.8 mg/dL — AB (ref 8.9–10.3)
CO2: 31 mmol/L (ref 22–32)
Chloride: 93 mmol/L — ABNORMAL LOW (ref 101–111)
Creatinine, Ser: 0.79 mg/dL (ref 0.44–1.00)
GFR calc Af Amer: >60 mL/min (ref >60)
GFR calc non Af Amer: >60 mL/min (ref >60)
GLUCOSE: 100 mg/dL — AB (ref 65–99)
Potassium: 5.4 mmol/L — ABNORMAL HIGH (ref 3.5–5.1)
Sodium: 130 mmol/L — ABNORMAL LOW (ref 135–145)

## 2015-11-23 LAB — VITAMIN D 25 HYDROXY (VIT D DEFICIENCY, FRACTURES): VIT D 25 HYDROXY: 35.5 ng/mL (ref 30.0–100.0)

## 2015-11-24 ENCOUNTER — Encounter (HOSPITAL_COMMUNITY)
Admission: RE | Admit: 2015-11-24 | Discharge: 2015-11-24 | Disposition: A | Discharge status: Home / Self Care | Payer: Medicare Other | Source: Skilled Nursing Facility | Attending: Internal Medicine | Admitting: Internal Medicine

## 2015-11-24 DIAGNOSIS — M069 Rheumatoid arthritis, unspecified: Secondary | ICD-10-CM | POA: Diagnosis not present

## 2015-11-24 LAB — BASIC METABOLIC PANEL
Anion gap: 5 (ref 5–15)
BUN: 12 mg/dL (ref 6–20)
CHLORIDE: 95 mmol/L — AB (ref 101–111)
CO2: 28 mmol/L (ref 22–32)
CREATININE: 0.77 mg/dL (ref 0.44–1.00)
Calcium: 8.4 mg/dL — ABNORMAL LOW (ref 8.9–10.3)
GFR calc non Af Amer: >60 mL/min (ref >60)
Glucose, Bld: 96 mg/dL (ref 65–99)
Potassium: 5.1 mmol/L (ref 3.5–5.1)
Sodium: 128 mmol/L — ABNORMAL LOW (ref 135–145)

## 2015-12-17 ENCOUNTER — Encounter (HOSPITAL_COMMUNITY): Payer: Self-pay

## 2015-12-17 ENCOUNTER — Inpatient Hospital Stay
Admission: RE | Admit: 2015-12-17 | Discharge: 2015-12-27 | Disposition: A | Discharge status: Home / Self Care | Payer: Medicare Other | Source: Ambulatory Visit | Attending: Internal Medicine | Admitting: Internal Medicine

## 2015-12-17 ENCOUNTER — Emergency Department (HOSPITAL_COMMUNITY)
Admission: EM | Admit: 2015-12-17 | Discharge: 2015-12-17 | Disposition: A | Discharge status: Home / Self Care | Payer: Medicare Other | Attending: Emergency Medicine | Admitting: Emergency Medicine

## 2015-12-17 ENCOUNTER — Emergency Department (HOSPITAL_COMMUNITY): Payer: Medicare Other

## 2015-12-17 DIAGNOSIS — W19XXXA Unspecified fall, initial encounter: Secondary | ICD-10-CM | POA: Diagnosis not present

## 2015-12-17 DIAGNOSIS — Z79899 Other long term (current) drug therapy: Secondary | ICD-10-CM | POA: Insufficient documentation

## 2015-12-17 DIAGNOSIS — Y939 Activity, unspecified: Secondary | ICD-10-CM | POA: Insufficient documentation

## 2015-12-17 DIAGNOSIS — Y929 Unspecified place or not applicable: Secondary | ICD-10-CM | POA: Diagnosis not present

## 2015-12-17 DIAGNOSIS — I10 Essential (primary) hypertension: Secondary | ICD-10-CM | POA: Diagnosis not present

## 2015-12-17 DIAGNOSIS — S301XXA Contusion of abdominal wall, initial encounter: Secondary | ICD-10-CM | POA: Insufficient documentation

## 2015-12-17 DIAGNOSIS — F329 Major depressive disorder, single episode, unspecified: Secondary | ICD-10-CM | POA: Diagnosis not present

## 2015-12-17 DIAGNOSIS — S20211A Contusion of right front wall of thorax, initial encounter: Secondary | ICD-10-CM | POA: Diagnosis not present

## 2015-12-17 DIAGNOSIS — R079 Chest pain, unspecified: Secondary | ICD-10-CM | POA: Insufficient documentation

## 2015-12-17 DIAGNOSIS — S8002XA Contusion of left knee, initial encounter: Secondary | ICD-10-CM | POA: Insufficient documentation

## 2015-12-17 DIAGNOSIS — Y999 Unspecified external cause status: Secondary | ICD-10-CM | POA: Insufficient documentation

## 2015-12-17 DIAGNOSIS — M25562 Pain in left knee: Secondary | ICD-10-CM | POA: Diagnosis present

## 2015-12-17 DIAGNOSIS — Z8673 Personal history of transient ischemic attack (TIA), and cerebral infarction without residual deficits: Secondary | ICD-10-CM | POA: Insufficient documentation

## 2015-12-17 LAB — COMPREHENSIVE METABOLIC PANEL
ALT: 14 U/L (ref 14–54)
ANION GAP: 8 (ref 5–15)
AST: 17 U/L (ref 15–41)
Albumin: 3.2 g/dL — ABNORMAL LOW (ref 3.5–5.0)
Alkaline Phosphatase: 81 U/L (ref 38–126)
BILIRUBIN TOTAL: 0.6 mg/dL (ref 0.3–1.2)
BUN: 6 mg/dL (ref 6–20)
CHLORIDE: 91 mmol/L — AB (ref 101–111)
CO2: 31 mmol/L (ref 22–32)
Calcium: 8.3 mg/dL — ABNORMAL LOW (ref 8.9–10.3)
Creatinine, Ser: 0.72 mg/dL (ref 0.44–1.00)
GLUCOSE: 120 mg/dL — AB (ref 65–99)
POTASSIUM: 4.4 mmol/L (ref 3.5–5.1)
Sodium: 130 mmol/L — ABNORMAL LOW (ref 135–145)
Total Protein: 6.5 g/dL (ref 6.5–8.1)

## 2015-12-17 LAB — URINE MICROSCOPIC-ADD ON: WBC, UA: NONE SEEN WBC/hpf (ref 0–5)

## 2015-12-17 LAB — URINALYSIS, ROUTINE W REFLEX MICROSCOPIC
Bilirubin Urine: NEGATIVE
Glucose, UA: NEGATIVE mg/dL
Ketones, ur: NEGATIVE mg/dL
Leukocytes, UA: NEGATIVE
Nitrite: NEGATIVE
Protein, ur: NEGATIVE mg/dL
SPECIFIC GRAVITY, URINE: 1.01 (ref 1.005–1.030)
pH: 7 (ref 5.0–8.0)

## 2015-12-17 LAB — CBC WITH DIFFERENTIAL/PLATELET
BASOS ABS: 0 10*3/uL (ref 0.0–0.1)
Basophils Relative: 0 %
EOS PCT: 1 %
Eosinophils Absolute: 0.1 10*3/uL (ref 0.0–0.7)
HEMATOCRIT: 36.3 % (ref 36.0–46.0)
Hemoglobin: 12.1 g/dL (ref 12.0–15.0)
LYMPHS PCT: 21 %
Lymphs Abs: 2.3 10*3/uL (ref 0.7–4.0)
MCH: 31.3 pg (ref 26.0–34.0)
MCHC: 33.3 g/dL (ref 30.0–36.0)
MCV: 93.8 fL (ref 78.0–100.0)
MONO ABS: 1.1 10*3/uL — AB (ref 0.1–1.0)
MONOS PCT: 10 %
Neutro Abs: 7.1 10*3/uL (ref 1.7–7.7)
Neutrophils Relative %: 68 %
PLATELETS: 355 10*3/uL (ref 150–400)
RBC: 3.87 MIL/uL (ref 3.87–5.11)
RDW: 12.8 % (ref 11.5–15.5)
WBC: 10.6 10*3/uL — ABNORMAL HIGH (ref 4.0–10.5)

## 2015-12-17 MED ORDER — ONDANSETRON 4 MG PO TBDP
4.0000 mg | ORAL_TABLET | Freq: Once | ORAL | Status: AC
  Administered 2015-12-17: 4 mg via ORAL
  Filled 2015-12-17: qty 1

## 2015-12-17 MED ORDER — HYDROCODONE-ACETAMINOPHEN 5-325 MG PO TABS
1.0000 | ORAL_TABLET | Freq: Once | ORAL | Status: AC
  Administered 2015-12-17: 1 via ORAL
  Filled 2015-12-17: qty 1

## 2015-12-17 MED ORDER — HYDROCODONE-ACETAMINOPHEN 5-325 MG PO TABS: 1.0000 | ORAL_TABLET | Freq: Four times a day (QID) | ORAL | Status: DC | PRN

## 2015-12-17 MED ORDER — LORAZEPAM 1 MG PO TABS: 1.0000 mg | ORAL_TABLET | Freq: Three times a day (TID) | ORAL | Status: DC | PRN

## 2015-12-17 NOTE — ED Notes (Signed)
PT Evaluation complete at this time.

## 2015-12-17 NOTE — Clinical Social Work Note (Addendum)
Clinical Social Work Assessment  Patient Details  Name: Robin Blackburn MRN: [id number] Date of Birth: [birth year]  Date of referral:  12/17/15               Reason for consult:  Discharge Planning                Permission sought to share information with:    Permission granted to share information::     Name::        Agency::     Relationship::     Contact Information:     Housing/Transportation Living arrangements for the past 2 months:  Single Family Home Source of Information:  Patient Patient Interpreter Needed:  None Criminal Activity/Legal Involvement Pertinent to Current Situation/Hospitalization:  No - Comment as needed Significant Relationships:  Siblings Lives with:  Self Do you feel safe going back to the place where you live?  No Need for family participation in patient care:  Yes (Comment)  Care giving concerns:  Pt reports she is unable to manage at home.    Social Worker assessment / plan:  CSW met with pt in ED. Pt known to CSW from previous admissions. She has recently left [facility] after being there for a year and a half. Pt indicates that she hired someone to help her at home and that has not worked out. She was ambulatory when leaving [facility] and is unable to do so now. PT evaluated pt in ED and recommend SNF. CSW discussed this with pt who is only agreeable to [facility]. Facility can offer a bed today. Family plans to apply for Medicaid as pt has used available resources at [facility] already. Facility aware. RN and EDP notified pt can transfer to [facility] today. [facility] reports all that is needed is FL2 and scripts.   Employment status:  Retired Nurse, adult PT Recommendations:  [place] / Referral to community resources:  [place]  Patient/Family's Response to care:  Pt only interested in [facility] and understands that they can offer a semi-private room only.   Patient/Family's Understanding of and Emotional Response to  Diagnosis, Current Treatment, and Prognosis:  Pt relieved to be able to go to [facility].   Emotional Assessment Appearance:  Appears stated age Attitude/Demeanor/Rapport:  Other (Cooperative) Affect (typically observed):  Appropriate Orientation:  Oriented to Self, Oriented to Place, Oriented to  Time, Oriented to Situation Alcohol / Substance use:  Not Applicable Psych involvement (Current and /or in the community):  No (Comment)  Discharge Needs  Concerns to be addressed:  Discharge Planning Concerns Readmission within the last 30 days:  No Current discharge risk:  Lives alone Barriers to Discharge:  No Barriers Identified   Salome Arnt, [place] 12/17/2015, 3:37 PM [phone]

## 2015-12-17 NOTE — ED Notes (Signed)
Patient resting in bed at this time. No needs voiced. 

## 2015-12-17 NOTE — Discharge Instructions (Signed)
Go to the [facility] now

## 2015-12-17 NOTE — ED Notes (Signed)
Pt brought in EMS from home. She was discharged from [facility] 3 weeks ago after being a resident there 1 1/2 years. States she fell twice on Saturday. Per dr Gerarda Fraction needs to go back to [facility] because she lives alone and having difficulty. Pt complaining of pain in left knee and left ankle

## 2015-12-17 NOTE — NC FL2 (Signed)
[place] LEVEL OF CARE SCREENING TOOL     IDENTIFICATION  Patient Name: Robin Blackburn Birthdate: 1945/06/10 Sex: female Admission Date (Current Location): 12/17/2015  [facility] and [place] Number:  [employer] and Address:  [facility] [address] [place]      Provider Number: [id number]  Attending Physician Name and Address:  Milton Ferguson, MD  Relative Name and Phone Number:       Current Level of Care: Hospital Recommended Level of Care: [place] Prior Approval Number:    Date Approved/Denied:   PASRR Number: [id number]  Discharge Plan: SNF    Current Diagnoses: Patient Active Problem List   Diagnosis Date Noted  . Osteoporosis 11/01/2015  . Vitamin D deficiency 10/11/2015  . History of fracture of left ankle 09/28/2015  . Chronic pain in left foot 09/28/2015  . Fibromyalgia 09/28/2015  . Rash and nonspecific skin eruption 07/17/2015  . Cerumen impaction 05/30/2015  . Edema 04/16/2015  . Cellulitis 04/16/2015  . Mouth pain 04/08/2015  . Pain in joint, lower leg 04/08/2015  . GERD (gastroesophageal reflux disease) 04/05/2015  . Occult blood positive stool 01/17/2015  . Diarrhea 01/05/2015  . Chronic pain syndrome 12/02/2014  . Pain in joint, upper arm 10/29/2014  . Nasal congestion 10/29/2014  . Allergic rhinitis 08/20/2014  . Conjunctivitis 08/08/2014  . Dysuria 06/27/2014  . Back pain, lumbosacral 06/23/2014  . Sinusitis, chronic 06/17/2014  . Fall 05/24/2014  . H/O: CVA (cerebrovascular accident) 05/24/2014  . HLD (hyperlipidemia) 05/24/2014  . Physical deconditioning 05/24/2014  . Fracture of fifth metatarsal bone of right foot 03/06/2014  . Right sided weakness 02/26/2014  . Leukocytosis 02/20/2014  . Rheumatoid arthritis ([place]) 02/20/2014  . CVA (cerebral infarction) 02/19/2014  . Right ankle sprain 02/19/2014  . Contusion of right foot 02/19/2014  . Essential hypertension  02/19/2014  . Neuropathy ([place]) 02/19/2014  . Anxiety 02/19/2014  . Hyponatremia 02/19/2014  . Hypokalemia 02/19/2014    Orientation RESPIRATION BLADDER Height & Weight     Self, Time, Situation, Place  Normal Continent Weight: 182 lb (82.555 kg) Height:  5' 6.5" (168.9 cm)  BEHAVIORAL SYMPTOMS/MOOD NEUROLOGICAL BOWEL NUTRITION STATUS  Other (Comment) (n/a)  (n/a) Continent Diet (Regular)  AMBULATORY STATUS COMMUNICATION OF NEEDS Skin   Extensive Assist Verbally Normal                       Personal Care Assistance Level of Assistance  Bathing, Feeding, Dressing Bathing Assistance: Limited assistance Feeding assistance: Independent Dressing Assistance: Limited assistance     Functional Limitations Info  Sight, Hearing, Speech Sight Info: Adequate Hearing Info: Adequate Speech Info: Adequate    SPECIAL CARE FACTORS FREQUENCY  PT (By licensed PT)     PT Frequency: 5              Contractures Contractures Info: Not present    Additional Factors Info  Code Status, Allergies Code Status Info: Full code Allergies Info: Demerol, Hydromorphone, Penicillins, Sulfa Antibiotics, Bisphosphonates, Fentanyl, Iohexol, Percocet, Shellfish Allergy, Terramycin, Ciprofloxacin, Levaquin, Morphine and Related.            Current Medications (12/17/2015):  This is the current hospital active medication list No current facility-administered medications for this encounter.   Current Outpatient Prescriptions  Medication Sig Dispense Refill  . acetaminophen (TYLENOL) 325 MG tablet Take 650 mg by mouth daily. May give a dose of Hydrocodone/APAP 7.5/5 prn along with this if  needed    . ALPRAZolam (XANAX) 1 MG tablet Take 1 mg by mouth 3 (three) times daily.    . Cholecalciferol (VITAMIN D3) 2000 units TABS Take 1 tablet by mouth daily.     . Cholecalciferol 5000 units TABS Will take 5,000 unit along with 2,000 unit daily for a 7,000 unit daily    . clopidogrel (PLAVIX) 75 MG  tablet Take 75 mg by mouth at bedtime.    [name] Kitchen desloratadine (CLARINEX) 5 MG tablet Take 5 mg by mouth daily.    [name] Kitchen Dextromethorphan-Guaifenesin (ROBITUSSIN COUGH/CHEST DM MAX) 10-200 MG/5ML LIQD 15 cc every 6 hours as needed for cough and congestion    . dicyclomine (BENTYL) 20 MG tablet Take 20 mg by mouth every 6 (six) hours as needed. Abdominal cramps    . diphenhydrAMINE (BENADRYL) 12.5 MG chewable tablet Chew 12.5 mg by mouth every 6 (six) hours as needed for itching or allergies.    [name] Kitchen docusate sodium (COLACE) 100 MG capsule Take 100 mg by mouth 2 (two) times daily as needed for mild constipation.     . hydrochlorothiazide (MICROZIDE) 12.5 MG capsule Take 1 capsule by mouth daily.    [name] Kitchen HYDROcodone-acetaminophen (NORCO) 7.5-325 MG tablet Take 1 tablet by mouth every 4 (four) hours as needed for moderate pain. 180 tablet 0  . hydroxychloroquine (PLAQUENIL) 200 MG tablet Take 200 mg by mouth daily.     . lansoprazole (PREVACID) 30 MG capsule Take 30 mg by mouth 2 (two) times daily.    . Lidocaine 4 % PTCH Apply daily as needed to site of pain. Leave in place no longer than 12 hours and then remove    . loperamide (IMODIUM A-D) 2 MG tablet Take 2 mg by mouth 3 (three) times daily as needed for diarrhea or loose stools.     . Menthol, Topical Analgesic, (BIOFREEZE EX) Apply 1 application topically 2 (two) times daily. To right upper leg    . methocarbamol (ROBAXIN) 750 MG tablet Take 750 mg by mouth every 6 (six) hours as needed for muscle spasms.    . metoprolol tartrate (LOPRESSOR) 25 MG tablet Take 1 tablet (25 mg total) by mouth 2 (two) times daily. 60 tablet 2  . ondansetron (ZOFRAN) 4 MG tablet Take 4 mg by mouth every 6 (six) hours. Give with plaquenil daily at 2 pm    . PARoxetine (PAXIL) 40 MG tablet Take 1 tablet by mouth daily.    [name] Kitchen Phenylephrine-DM-GG-APAP (TYLENOL COLD/FLU SEVERE PO) Take 30 mLs by mouth every 6 (six) hours as needed (cold).     . Propylene Glycol 0.6 % SOLN Apply 1 drop  to eye daily. At noon.    . RESTASIS 0.05 % ophthalmic emulsion Place 1 drop into both eyes 2 (two) times daily.    . simethicone (MYLICON) 0000000 MG chewable tablet Chew 125 mg by mouth at bedtime. And as needed.    . sodium chloride (OCEAN) 0.65 % SOLN nasal spray Place 1 spray into both nostrils as needed for congestion.    [name] Kitchen spironolactone (ALDACTONE) 25 MG tablet Take 25 mg by mouth daily.    [name] Kitchen zolpidem (AMBIEN CR) 12.5 MG CR tablet Take 1 tablet (12.5 mg total) by mouth at bedtime as needed for sleep. 30 tablet 2     Discharge Medications: Please see discharge summary for a list of discharge medications.  Relevant Imaging Results:  Relevant Lab Results:   Additional Information    Salome Arnt, [place]

## 2015-12-17 NOTE — Clinical Social Work Note (Signed)
CSW notified of placement request. Awaiting PT evaluation for recommendations. Will follow up.  [name], [place]

## 2015-12-17 NOTE — ED Notes (Signed)
Patient discharged to [facility]

## 2015-12-17 NOTE — ED Provider Notes (Signed)
CSN: [id number]     Arrival date & time 12/17/15  1026 History  By signing my name below, I, Jasmyn B. Alexander, attest that this documentation has been prepared under the direction and in the presence of Milton Ferguson, MD.  Electronically Signed: Tedra Coupe. Sheppard Coil, ED Scribe. 12/17/2015. 12:51 PM.     Chief Complaint  Patient presents with  . Fall  . Knee Pain  . Ankle Pain     (Consider location/radiation/quality/duration/timing/severity/associated sxs/prior Treatment) Patient is a 70 y.o. female presenting with fall. The history is provided by the patient (Patient states that she fell in bed twice this weekend she has pain in her left knee and ankle. Patient wants to go back to the rest home she states she can't take care of herself). No language interpreter was used.  Fall This is a new problem. The current episode started 2 days ago. The problem occurs constantly. The problem has been resolved. Pertinent negatives include no abdominal pain and no headaches. Nothing aggravates the symptoms. Nothing relieves the symptoms.    HPI Comments: CRISANTA MININNI is a 70 y.o. female brought in by ambulance, who presents to the Emergency Department complaining of fall  Past Medical History  Diagnosis Date  . Neuropathy ([place])   . Hypertension   . Anxiety   . Depression   . Acid reflux   . Rheumatoid arthritis ([place])   . Fibromyalgia   . Stroke ([place]) 02/17/14  . CVA (cerebral infarction) 02/19/2014    Acute left thalamic   Past Surgical History  Procedure Laterality Date  . Back surgery    . Knee surgery    . Ankle reconstruction    . Abdominal hysterectomy    . Appendectomy    . Cholecystectomy     Family History  Problem Relation Age of Onset  . Hypertension Father   . Hypertension Brother   . Transient ischemic attack Father   . CVA Maternal Grandfather   . Leukemia Brother    Social History  Substance Use Topics  . Smoking status: Never Smoker   . Smokeless tobacco:  None  . Alcohol Use: No   OB History    No data available     Review of Systems  Constitutional: Negative for appetite change and fatigue.  HENT: Negative for congestion, ear discharge and sinus pressure.   Eyes: Negative for discharge.  Respiratory: Negative for cough.   Cardiovascular: Negative for palpitations.  Gastrointestinal: Negative for abdominal pain and diarrhea.  Genitourinary: Negative for frequency and hematuria.  Musculoskeletal: Negative for back pain.  Skin: Negative for rash.  Neurological: Negative for seizures and headaches.  Psychiatric/Behavioral: Negative for hallucinations.  All other systems reviewed and are negative.     Allergies  Demerol; Hydromorphone; Bisphosphonates; Ciprofloxacin; Fentanyl; Iohexol; Levaquin; Penicillins; Percocet; Shellfish allergy; Sulfa antibiotics; Terramycin; and Morphine and related  Home Medications   Prior to Admission medications   Medication Sig Start Date End Date Taking? Authorizing Provider  acetaminophen (TYLENOL) 325 MG tablet Take 650 mg by mouth daily. May give a dose of Hydrocodone/APAP 7.5/5 prn along with this if needed    Historical Provider, MD  ALPRAZolam Duanne Moron) 1 MG tablet Take 1 mg by mouth 3 (three) times daily.    Historical Provider, MD  calcium-vitamin D (OSCAL WITH D) 500-200 MG-UNIT tablet Take 1 tablet by mouth 2 (two) times daily.    Historical Provider, MD  Carbamide Peroxide (MURINE EAR OT) Earache relief, 1 drop each ear ; both  ears as needed for earache PRN    Historical Provider, MD  Cholecalciferol (VITAMIN D3) 2000 units TABS Take by mouth daily.    Historical Provider, MD  Cholecalciferol 5000 units TABS Will take 5,000 unit along with 2,000 unit daily for a 7,000 unit daily    Historical Provider, MD  clopidogrel (PLAVIX) 75 MG tablet Take 75 mg by mouth at bedtime. 05/03/14   Historical Provider, MD  desloratadine (CLARINEX) 5 MG tablet Take 5 mg by mouth daily.    Historical Provider,  MD  Dextromethorphan-Guaifenesin (ROBITUSSIN COUGH/CHEST DM MAX) 10-200 MG/5ML LIQD 15 cc every 6 hours as needed for cough and congestion    Historical Provider, MD  diclofenac sodium (VOLTAREN) 1 % GEL Apply 3 g topically 3 (three) times daily. Apply to large joints as directed    Historical Provider, MD  dicyclomine (BENTYL) 20 MG tablet Take 20 mg by mouth every 6 (six) hours as needed. Abdominal cramps 01/31/14   Historical Provider, MD  diphenhydrAMINE (BENADRYL) 12.5 MG chewable tablet Chew 12.5 mg by mouth every 6 (six) hours as needed for itching or allergies.    Historical Provider, MD  docusate sodium (COLACE) 100 MG capsule Take 100 mg by mouth 2 (two) times daily as needed for mild constipation.     Historical Provider, MD  HYDROcodone-acetaminophen (NORCO) 7.5-325 MG tablet Take 1 tablet by mouth every 4 (four) hours as needed for moderate pain. 11/15/15   Lauree Chandler, NP  hydrocortisone (ANUSOL-HC) 2.5 % rectal cream Place 1 application rectally 3 (three) times daily as needed for hemorrhoids or itching.     Historical Provider, MD  hydroxychloroquine (PLAQUENIL) 200 MG tablet Take 200 mg by mouth daily.     Historical Provider, MD  lansoprazole (PREVACID) 30 MG capsule Take 30 mg by mouth 2 (two) times daily. 02/17/14   Historical Provider, MD  Lidocaine 4 % PTCH Apply daily as needed to site of pain. Leave in place no longer than 12 hours and then remove    Historical Provider, MD  loperamide (IMODIUM A-D) 2 MG tablet Take 2 mg by mouth 3 (three) times daily as needed for diarrhea or loose stools.     Historical Provider, MD  magic mouthwash SOLN Take 5 mLs by mouth every 6 (six) hours as needed for mouth pain.    Historical Provider, MD  Menthol, Topical Analgesic, (BIOFREEZE EX) Apply 1 application topically 2 (two) times daily. To right upper leg    Historical Provider, MD  methocarbamol (ROBAXIN) 750 MG tablet Take 750 mg by mouth every 6 (six) hours as needed for muscle spasms.     Historical Provider, MD  metoprolol tartrate (LOPRESSOR) 25 MG tablet Take 1 tablet (25 mg total) by mouth 2 (two) times daily. 09/28/15   Hendricks Limes, MD  NON FORMULARY Heat packs to knees BID PRN as needed    Historical Provider, MD  ondansetron (ZOFRAN) 4 MG tablet Take 4 mg by mouth every 6 (six) hours.     Historical Provider, MD  ondansetron (ZOFRAN) 4 MG tablet Take 4 mg by mouth every 6 (six) hours. Give with plaquenil daily at 2 pm    Historical Provider, MD  PARoxetine (PAXIL) 20 MG tablet Take 40 mg by mouth daily.  05/03/14   Historical Provider, MD  Phenylephrine-DM-GG-APAP (TYLENOL COLD/FLU SEVERE PO) Take by mouth. 30 ml by mouth Q 6 hrs. As needed at bedtime    Historical Provider, MD  Propylene Glycol 0.6 % SOLN  Apply 1 drop to eye daily.    Historical Provider, MD  RESTASIS 0.05 % ophthalmic emulsion Place 1 drop into both eyes 2 (two) times daily. 01/31/14   Historical Provider, MD  simethicone (MYLICON) 0000000 MG chewable tablet Chew 125 mg by mouth every 6 (six) hours as needed for flatulence.    Historical Provider, MD  sodium chloride (OCEAN) 0.65 % SOLN nasal spray Place 1 spray into both nostrils as needed for congestion. 02/22/14   Rexene Alberts, MD  spironolactone (ALDACTONE) 25 MG tablet Take 25 mg by mouth daily.    Historical Provider, MD  zolpidem (AMBIEN CR) 12.5 MG CR tablet Take 1 tablet (12.5 mg total) by mouth at bedtime as needed for sleep. 10/18/15   Tiffany L Reed, DO   BP 132/71 mmHg  Pulse 67  Temp(Src) 97.9 F (36.6 C) (Oral)  Resp 18  Ht 5' 6.5" (1.689 m)  Wt 182 lb (82.555 kg)  BMI 28.94 kg/m2  SpO2 98% Physical Exam  Constitutional: She is oriented to person, place, and time. She appears well-developed.  HENT:  Head: Normocephalic.  Eyes: Conjunctivae and EOM are normal. No scleral icterus.  Neck: Neck supple. No thyromegaly present.  Cardiovascular: Normal rate and regular rhythm.  Exam reveals no gallop and no friction rub.   No murmur  heard. Pulmonary/Chest: No stridor. She has no wheezes. She has no rales. She exhibits tenderness.  Bruising to right chest  Abdominal: She exhibits no distension. There is tenderness. There is no rebound.  Bruising to right side of abdomen  Musculoskeletal: Normal range of motion. She exhibits no edema.  Tenderness to left knee and left ankle  Lymphadenopathy:    She has no cervical adenopathy.  Neurological: She is oriented to person, place, and time. She exhibits normal muscle tone. Coordination normal.  Skin: No rash noted. No erythema.  Psychiatric: She has a normal mood and affect. Her behavior is normal.    ED Course  Procedures (including critical care time) DIAGNOSTIC STUDIES: Oxygen Saturation is 98% on RA, normal by my interpretation.    COORDINATION OF CARE: 12:51 PM-Discussed treatment plan which includes  with pt at bedside and pt agreed to plan.   Labs Review Labs Reviewed  CBC WITH DIFFERENTIAL/PLATELET - Abnormal; Notable for the following:    WBC 10.6 (*)    Monocytes Absolute 1.1 (*)    All other components within normal limits  COMPREHENSIVE METABOLIC PANEL  URINALYSIS, ROUTINE W REFLEX MICROSCOPIC (NOT AT [facility])    Imaging Review Dg Ankle Complete Left  12/17/2015  CLINICAL DATA:  Pain following recent falls EXAM: LEFT ANKLE COMPLETE - 3+ VIEW COMPARISON:  May 24, 2014 FINDINGS: Frontal, oblique, lateral views were obtained. There is soft tissue swelling medially. There is no demonstrable fracture or joint effusion. The ankle mortise appears intact. The joint spaces appear unremarkable. There is a spur along the inferior calcaneus. IMPRESSION: Soft tissue swelling medially. No fracture. Mortise appears intact. There is an inferior calcaneal spur. Electronically Signed   By: Lowella Grip III M.D.   On: 12/17/2015 11:15   Dg Knee Complete 4 Views Left  12/17/2015  CLINICAL DATA:  Fall on Saturday pain and swelling left knee and left ankle EXAM: LEFT  KNEE - COMPLETE 4+ VIEW COMPARISON:  None. FINDINGS: Four views of the left knee submitted. No acute fracture or subluxation. Small joint effusion. There is diffuse osteopenia. Narrowing of medial joint compartment. IMPRESSION: No acute fracture or subluxation. Small joint effusion. Diffuse  osteopenia. Mild degenerative changes. Electronically Signed   By: Lahoma Crocker M.D.   On: 12/17/2015 11:18   I have personally reviewed and evaluated these images and lab results as part of my medical decision-making.   EKG Interpretation None      MDM   Final diagnoses:  None    Patient with multiple falls contusion to left knee left ankle right chest right abdomen. Patient is sent back over to the nursing home at the [facility]   The chart was scribed for me under my direct supervision.  I personally performed the history, physical, and medical decision making and all procedures in the evaluation of this patient.Milton Ferguson, MD 12/17/15 [phone]

## 2015-12-17 NOTE — Evaluation (Signed)
Physical Therapy Evaluation Patient Details Name: Robin Blackburn MRN: [id number] DOB: [birth year] Today's Date: 12/17/2015   History of Present Illness  70 yo F seen while in the ED after several falls at home.  Pt reports that she fell 2 x's- once when attempting to get OOB, and then again when her sister in law was trying to assist her back into the bed.  Pt was recently d/c from [facility], where she was staying for 1.5 years.  Pt states that while she was there she mostly used her w/c for mobility, however the w/c that she has at home is too wide for her to use in the apartment.  Clinical Impression  Pt received in ED on the stretcher, and was agreeable to PT evaluation.  Pt states that she lives alone, and she was receiving home health services, but they had not been out to see her in 1 week because she was sick with a kidney infection.  Pt is normally modified independent with ambulation with rollator at home, however she states that her main mode of mobilitiy at [facility] was a w/c.  She states the reason she doesn't use the w/c is because it is too wide to fit through her apartment.  She has an aid that comes for a few hours per day.  Today she required Mod A for supine<>sit, and then was unable to perform sit<>stand despite 3 trials and Max A from PT.  Pt c/o L knee pain during evaluation, however, XR revealed no fx.  At this point, she is not safe to d/c home alone due to multiple falls, and limited mobility and weakness.  Pt would benefit from SNF to improve functional mobility.      Follow Up Recommendations SNF    Equipment Recommendations  None recommended by PT    Recommendations for Other Services       Precautions / Restrictions Precautions Precautions: Fall (Pt reports falling 2x's at home getting in/out of bed. )      Mobility  Bed Mobility Overal bed mobility: Needs Assistance Bed Mobility: Supine to Sit;Sit to Supine     Supine to sit: Mod assist (tc's for sequencing, and assist at  trunk. ) Sit to supine: Supervision (supine scoot with supervision. )   General bed mobility comments: increased time and encouragement.   Transfers                 General transfer comment: Attempted 3x's, however pt not able to lift buttocks off EOB despite having elevated surface.    Ambulation/Gait                Stairs            Wheelchair Mobility    Modified Rankin (Stroke Patients Only)       Balance Overall balance assessment: Modified Independent Sitting-balance support: Bilateral upper extremity supported Sitting balance-Leahy Scale: Fair                                       Pertinent Vitals/Pain Pain Assessment: 0-10 Pain Score: 8  Pain Location: "All over"  Pain Descriptors / Indicators: Aching;Sharp Pain Intervention(s): Limited activity within patient's tolerance    Home Living   Living Arrangements: Alone (aid to run errands) Available Help at Discharge: Personal care attendant (M-F between 4-7 hours a day for cooking/meals, but hasn't) Type of Home: House Home Access:  Stairs to enter   [employer] of Steps: 4 steps with HR on the Right.  Home Layout: One level Home Equipment: [place] - 4 wheels;Wheelchair - Regulatory affairs officer - single point;Bedside commode (states w/c is too big for the apartment)      Prior Function     Gait / Transfers Assistance Needed: Pt states that she uses the rollator to get around the house. Pt states she is able to ambulate room to room because it is a small apartment.   ADL's / Homemaking Assistance Needed: aid to assist with cleaning/laundry, and meals - pt states the aid has not been cooking, but bringing fast foot instead.         Hand Dominance   Dominant Hand: Right    Extremity/Trunk Assessment   Upper Extremity Assessment: Generalized weakness           Lower Extremity Assessment: Generalized weakness (MMT 2/5 generally. )          Communication   Communication: No difficulties  Cognition Arousal/Alertness: Awake/alert Behavior During Therapy: WFL for tasks assessed/performed Overall Cognitive Status: Within Functional Limits for tasks assessed                      General Comments      Exercises        Assessment/Plan    PT Assessment Patient needs continued PT services  PT Diagnosis Difficulty walking;Abnormality of gait;Generalized weakness;Acute pain   PT Problem List Decreased strength;Decreased range of motion;Decreased activity tolerance;Decreased balance;Decreased mobility;Decreased knowledge of use of DME;Decreased safety awareness;Decreased knowledge of precautions;Obesity;Pain  PT Treatment Interventions DME instruction;Gait training;Stair training;Functional mobility training;Therapeutic activities;Therapeutic exercise;Balance training;Patient/family education;Wheelchair mobility training   PT Goals (Current goals can be found in the Care Plan section) Acute Rehab PT Goals Patient Stated Goal: Pt expressed that she can't live alone anymore, and needs assistance.  PT Goal Formulation: With patient Time For Goal Achievement: 12/24/15 Potential to Achieve Goals: Fair    Frequency Min 3X/week   Barriers to discharge Decreased caregiver support;Inaccessible home environment (Pt lives alone, and has 4 steps to get in/out of the house. )      Co-evaluation               End of Session Equipment Utilized During Treatment: Gait belt Activity Tolerance: Patient limited by pain Patient left: in bed;with call bell/phone within reach Nurse Communication: Mobility status    Functional Assessment Tool Used: [employer] "6-clicks"  Functional Limitation: Mobility: Walking and moving around Mobility: Walking and Moving Around Current Status [phone]): At least 80 percent but less than 100 percent impaired, limited or restricted Mobility: Walking and Moving Around Goal Status  [phone]): At least 60 percent but less than 80 percent impaired, limited or restricted    Time: 1416-1455 PT Time Calculation (min) (ACUTE ONLY): 39 min   Charges:   PT Evaluation $PT Eval Moderate Complexity: 1 Procedure PT Treatments $Therapeutic Activity: 23-37 mins   PT G Codes:   PT G-Codes **NOT FOR INPATIENT CLASS** Functional Assessment Tool Used: [employer] "6-clicks"  Functional Limitation: Mobility: Walking and moving around Mobility: Walking and Moving Around Current Status [phone]): At least 80 percent but less than 100 percent impaired, limited or restricted Mobility: Walking and Moving Around Goal Status [phone]): At least 60 percent but less than 80 percent impaired, limited or restricted    Beth Addisen Chappelle, PT, DPT X: [phone]

## 2015-12-17 NOTE — Clinical Social Work Placement (Signed)
   CLINICAL SOCIAL WORK PLACEMENT  NOTE  Date:  12/17/2015  Patient Details  Name: DEDRE SHORTS MRN: [id number] Date of Birth: 03-04-1946  Clinical Social Work is seeking post-discharge placement for this patient at the [place] level of care (*CSW will initial, date and re-position this form in  chart as items are completed):  Yes   Patient/family provided with [place] Work Department's list of facilities offering this level of care within the geographic area requested by the patient (or if unable, by the patient's family).  Yes   Patient/family informed of their freedom to choose among providers that offer the needed level of care, that participate in Medicare, Medicaid or managed care program needed by the patient, have an available bed and are willing to accept the patient.  Yes   Patient/family informed of 's ownership interest in [facility] and [facility], as well as of the fact that they are under no obligation to receive care at these facilities.  PASRR submitted to EDS on       PASRR number received on       Existing PASRR number confirmed on 12/17/15     FL2 transmitted to all facilities in geographic area requested by pt/family on 12/17/15     FL2 transmitted to all facilities within larger geographic area on       Patient informed that his/her managed care company has contracts with or will negotiate with certain facilities, including the following:        Yes   Patient/family informed of bed offers received.  Patient chooses bed at [facility]     Physician recommends and patient chooses bed at      Patient to be transferred to [facility] on 12/17/15.  Patient to be transferred to facility by staff     Patient family notified on 12/17/15 of transfer.  Name of family member notified:  Pt will notify family     PHYSICIAN       Additional Comment:     _______________________________________________ Salome Arnt, LCSW 12/17/2015, 3:25 PM [phone]

## 2015-12-17 NOTE — ED Notes (Signed)
Patient discharged from ED to [facility].

## 2015-12-17 NOTE — ED Notes (Signed)
[facility] [place] [phone]

## 2015-12-18 ENCOUNTER — Encounter: Payer: Self-pay | Admitting: Internal Medicine

## 2015-12-18 ENCOUNTER — Other Ambulatory Visit: Payer: Self-pay | Admitting: *Deleted

## 2015-12-18 ENCOUNTER — Non-Acute Institutional Stay (SKILLED_NURSING_FACILITY): Payer: Medicare Other | Admitting: Internal Medicine

## 2015-12-18 DIAGNOSIS — Z8673 Personal history of transient ischemic attack (TIA), and cerebral infarction without residual deficits: Secondary | ICD-10-CM | POA: Diagnosis not present

## 2015-12-18 DIAGNOSIS — E871 Hypo-osmolality and hyponatremia: Secondary | ICD-10-CM

## 2015-12-18 DIAGNOSIS — W19XXXD Unspecified fall, subsequent encounter: Secondary | ICD-10-CM

## 2015-12-18 DIAGNOSIS — M25562 Pain in left knee: Secondary | ICD-10-CM | POA: Insufficient documentation

## 2015-12-18 NOTE — Assessment & Plan Note (Signed)
Ortho consultation

## 2015-12-18 NOTE — Assessment & Plan Note (Signed)
HCTZ will be discontinued as she  is on spironolactone

## 2015-12-18 NOTE — Progress Notes (Signed)
    Facility Location: [place] Room Number: 120/W  PCP: Glo Herring., MD [place] [phone]   This is a nursing facility follow up for [facility] readmission within 30 days.  Interim medical record and care since last [facility] visit was updated with review of diagnostic studies and change in clinical status since last visit were documented.  HPI: The patient went to the emergency room 12/17/15 having fallen out of bed reaching for the light at her apartment .She struck her right chest, abdomen, and left knee. She states that the left lower extremity hyperflexed backwards.  Prior that time she had been ambulating with her walker without any falls. Because of the pain she had difficulty moving even with help ,prompting her to request return to [facility] as she is unable to care for herself. She was found to have bruising over the right chest and right abdomen. There was tenderness to palpation of the left knee and left ankle. Plain ankle and knee films revealed soft tissue swelling medially of the left ankle and osteopenia and mild degenerative changes in the knee. No fractures were present. Ecchymosis was present of the right breast area, right abdomen, and left knee/lower extremity. Significantly she is on Plavix. She was diagnosed with contusion of the left knee. She did exhibit hyponatremia which is a chronic recurrent issue. Sodium was 130. White count was minimally elevated at 10,600. No anemia present.  Review of systems: Denied were any change in heart rhythm or rate prior to the event. There was no associated chest pain or shortness of breath . Also specifically denied prior to the episode were headache, limb weakness, tingling, or numbness. No seizure activity noted. Epistaxis, hemoptysis, hematuria, melena, or rectal bleeding denied. No unexplained weight loss, significant dyspepsia,dysphagia, or abdominal pain.  There is no  abnormal  bleeding, or difficulty stopping bleeding with injury.  Pertinent or positive findings include: She is somewhat lethargic but oriented. Scattered bruising was noted of the left lower extremity as well as right upper quadrant. Pedal pulses are decreased. She has pes planus. General appearance :adequately nourished; in no distress. Eyes: No conjunctival inflammation or scleral icterus is present. Oral exam:  Lips and gums are healthy appearing.There is no oropharyngeal erythema or exudate noted. Dental hygiene is good. Heart:  Normal rate and regular rhythm. S1 and S2 normal without gallop, murmur, click, rub or other extra sounds   Lungs:Chest clear to auscultation; no wheezes, rhonchi,rales ,or rubs present.No increased work of breathing.  Abdomen: Protuberant;bowel sounds normal, soft and non-tender without masses, organomegaly or hernias noted.  No guarding or rebound. No flank tenderness to percussion. Vascular : all pulses equal ; no bruits present. Skin:Warm & dry.  Intact without suspicious lesions or rashes ; no tenting or jaundice  Lymphatic: No lymphadenopathy is noted about the head, neck, axilla.  Neuro: Strength, tone generally decreased   See summary under each active problem in the Problem List with associated updated therapeutic plan

## 2015-12-18 NOTE — Assessment & Plan Note (Signed)
Medication list was reviewed and those on the [place] List verified and the risks discussed frankly. These include alprazolam 1 mg 3 times a day, Benadryl, hydrocodone, generic Robaxin, and zolpidem. Orthopedic consultation will be pursued for the residual left knee pain with effusion.

## 2015-12-18 NOTE — Patient Instructions (Signed)
I recommend an Orthopedic consultation to assess L knee pain and swelling. Hydrocodone, Xanax, zolpidem, Benadryl, and Robaxin are among those which experts have documented to have a very  high risk of affecting  mental  alertness  & balance. This results in increased risk of falling with serious health or life threatening injury. Such medication should be taken as infrequently as possible and @  the lowest possible dose.It should not be taken with alcohol, sedatives  or other agents which have a similar  adverse risk potential. These risks are greater as we age as there is decreased ability of the liver and kidneys to metabolize and excrete the medication, resulting in   increased blood levels of the active ingredient as we discussed. If pain control is a major issue; referral be made to a chronic pain center.

## 2015-12-18 NOTE — Assessment & Plan Note (Signed)
If recurrent falls or muscle skeletal injury are issues; reevaluation by Neurology as to the need for Plavix will be pursued

## 2015-12-19 ENCOUNTER — Other Ambulatory Visit: Payer: Self-pay

## 2015-12-19 MED ORDER — ALPRAZOLAM 1 MG PO TABS: ORAL_TABLET | ORAL | Status: DC

## 2015-12-19 NOTE — Telephone Encounter (Signed)
Refill request [facility]

## 2015-12-20 ENCOUNTER — Other Ambulatory Visit: Payer: Self-pay

## 2015-12-20 MED ORDER — HYDROCODONE-ACETAMINOPHEN 5-325 MG PO TABS: 1.0000 | ORAL_TABLET | Freq: Four times a day (QID) | ORAL | Status: DC | PRN

## 2015-12-20 NOTE — Telephone Encounter (Signed)
Rx faxed to [facility] at [phone].   [address] [place], [place] [address]  Phone #: [phone] 

## 2015-12-25 ENCOUNTER — Encounter: Payer: Self-pay | Admitting: Internal Medicine

## 2015-12-25 ENCOUNTER — Non-Acute Institutional Stay (SKILLED_NURSING_FACILITY): Payer: Medicare Other | Admitting: Internal Medicine

## 2015-12-25 DIAGNOSIS — M069 Rheumatoid arthritis, unspecified: Secondary | ICD-10-CM | POA: Diagnosis not present

## 2015-12-25 DIAGNOSIS — I10 Essential (primary) hypertension: Secondary | ICD-10-CM

## 2015-12-25 DIAGNOSIS — Z9181 History of falling: Secondary | ICD-10-CM | POA: Diagnosis not present

## 2015-12-25 DIAGNOSIS — E871 Hypo-osmolality and hyponatremia: Secondary | ICD-10-CM

## 2015-12-25 NOTE — Progress Notes (Signed)
Patient ID: Robin Blackburn, female   DOB: Aug 03, 1945, 70 y.o.   MRN: [id number]  Location:   [facility] Room Number: 115/W Place of Service:  SNF [phone]) Provider:  Amado Coe., MD  Patient Care Team: Redmond School, MD as PCP - General (Internal Medicine)  Extended Emergency Contact Information Primary Emergency Contact: [facility] Address: [address]          [place],  [address] [name] of [place] Phone: [phone] Mobile Phone: [phone] Relation: Brother  Code Status:  Full Code Goals of care: Advanced Directive information Advanced Directives 12/25/2015  Does patient have an advance directive? Yes  Type of Advance Directive (No Data)  Does patient want to make changes to advanced directive? No - Patient declined  Copy of advanced directive(s) in chart? Yes     Chief Complaint  Patient presents with  . Discharge Note    HPI:  Pt is a 70 y.o. female seen today for discharge---- Patient had been a long-term resident of this facility and was recently discharged-however she presented back to the emergency room on 12/17/2015 after a fall out of bed-apparently she was reaching for light-she struck her right chest abdomen and left knee-stated her left lower extremity hyperflexed backwards.  She has returned to the [facility] secondary apparently to some weakness and concern about not being able to care for herself-.  However she states she really wants to get back home-and feels confident she will be okay.  Plain ankle knee films showed soft tissue swelling medially of the left ankle and osteopenia mild to dinner changes in the knee-no fractures noted-she does have ecchymosis right breast area abdomen and left knee lower extremity.  Lab work appear to be fairly unremarkable sodium is 1:30 which is relatively baseline white count minimally elevated at 10,600.        Past Medical History  Diagnosis Date  .  Neuropathy ([place])   . Hypertension   . Anxiety   . Depression   . Acid reflux   . Rheumatoid arthritis ([place])   . Fibromyalgia   . Stroke ([place]) 02/17/14  . CVA (cerebral infarction) 02/19/2014    Acute left thalamic   Past Surgical History  Procedure Laterality Date  . Back surgery    . Knee surgery    . Ankle reconstruction    . Abdominal hysterectomy    . Appendectomy    . Cholecystectomy      Allergies  Allergen Reactions  . Demerol [Meperidine] Anaphylaxis  . Hydromorphone Anaphylaxis  . Penicillins Anaphylaxis    Has patient had a PCN reaction causing immediate rash, facial/tongue/throat swelling, SOB or lightheadedness with hypotension: Yes Has patient had a PCN reaction causing severe rash involving mucus membranes or skin necrosis: No Has patient had a PCN reaction that required hospitalization No Has patient had a PCN reaction occurring within the last 10 years: No If all of the above answers are "NO", then may proceed with Cephalosporin use.   . Sulfa Antibiotics Nausea And Vomiting  . Bisphosphonates     GI intolerance  . Fentanyl Other (See Comments)    "felt like I had demons in my head"  . Iohexol   . Percocet [Oxycodone-Acetaminophen] Swelling    Mouth swelling.  . Shellfish Allergy     Glucosamine not an option  . Terramycin [Oxytetracycline] Nausea And Vomiting  . Ciprofloxacin Nausea Only and Rash  . Levaquin [Levofloxacin In D5w] Nausea And Vomiting  and Rash  . Morphine And Related Rash    Current Outpatient Prescriptions on File Prior to Visit  Medication Sig Dispense Refill  . acetaminophen (TYLENOL) 325 MG tablet Take 650 mg by mouth daily. May give a dose of Hydrocodone/APAP 7.5/5 prn along with this if needed    . ALPRAZolam (XANAX) 1 MG tablet Take one tablet three times daily. Monitor for frequent complaints, frequent movements SOB document behavior 90 tablet 0  . Cholecalciferol (VITAMIN D3) 2000 units TABS Take 1 tablet by mouth daily.       . Cholecalciferol 5000 units TABS Will take 5,000 unit along with 2,000 unit daily for a 7,000 unit daily    . clopidogrel (PLAVIX) 75 MG tablet Take 75 mg by mouth at bedtime.    [name] Kitchen desloratadine (CLARINEX) 5 MG tablet Take 5 mg by mouth daily.    [name] Kitchen dicyclomine (BENTYL) 20 MG tablet Take 20 mg by mouth every 6 (six) hours as needed. Abdominal cramps    . docusate sodium (COLACE) 100 MG capsule Take 100 mg by mouth 2 (two) times daily as needed for mild constipation.     [name] Kitchen HYDROcodone-acetaminophen (NORCO/VICODIN) 5-325 MG tablet Take 1 tablet by mouth every 6 (six) hours as needed for moderate pain. 30 tablet 0  . hydroxychloroquine (PLAQUENIL) 200 MG tablet Take 200 mg by mouth daily.     . lansoprazole (PREVACID) 30 MG capsule Take 30 mg by mouth 2 (two) times daily.    . methocarbamol (ROBAXIN) 750 MG tablet Take 750 mg by mouth every 6 (six) hours as needed for muscle spasms.    . metoprolol tartrate (LOPRESSOR) 25 MG tablet Take 1 tablet (25 mg total) by mouth 2 (two) times daily. 60 tablet 2  . ondansetron (ZOFRAN) 4 MG tablet Take 4 mg by mouth every 6 (six) hours as needed. Give with plaquenil daily at 10 am    . PARoxetine (PAXIL) 40 MG tablet Take 1 tablet by mouth daily.    [name] Kitchen Propylene Glycol 0.6 % SOLN Place 1 drop into both eyes daily. At noon.    . RESTASIS 0.05 % ophthalmic emulsion Place 1 drop into both eyes 2 (two) times daily.    . simethicone (MYLICON) 0000000 MG chewable tablet Chew 125 mg by mouth at bedtime. And as needed.    . sodium chloride (OCEAN) 0.65 % SOLN nasal spray Place 1 spray into both nostrils as needed for congestion.    [name] Kitchen spironolactone (ALDACTONE) 25 MG tablet Take 25 mg by mouth daily.    [name] Kitchen zolpidem (AMBIEN CR) 12.5 MG CR tablet Take 1 tablet (12.5 mg total) by mouth at bedtime as needed for sleep. 30 tablet 2   No current facility-administered medications on file prior to visit.    Review of Systems   In general is not complaining of fever chills says  she's still having pain however in her left leg orthopedic consult is pending.  Skin does not complain of rashes or itching does have some bruising as noted above.  Ears eyes nose mouth and throat does not complain of visual changes or sore throat.  Respiratory is not complaining of shortness breath or cough.  Cardiac does not complain of chest pain or significant lower extremity edema.  GI is not complaining of nausea vomiting diarrhea constipation or abdominal discomfort.  GU is not complaining of dysuria.  Musculoskeletal is complaining of diffuse pain more so left lower extremity-she continues on Vicodin every 6 hours as needed and  takes this quite regularly.  Neurologic is not complaining of dizziness headache or syncopal-type feelings.  Psych does have a history of depression and anxiety although this appears to have been fairly well-controlled although she is somewhat anxious about her pain issues and medication issues today.    Immunization History  Administered Date(s) Administered  . PPD Test 12/17/2015   Pertinent  Health Maintenance Due  Topic Date Due  . PNA vac Low Risk Adult (1 of 2 - PCV13) 11/13/2016 (Originally 06/14/2010)  . MAMMOGRAM  11/13/2017 (Originally 06/15/1995)  . COLONOSCOPY  11/13/2025 (Originally 06/15/1995)  . INFLUENZA VACCINE  01/08/2016  . DEXA SCAN  Completed   No flowsheet data found. Functional Status Survey:    Filed Vitals:   12/25/15 1137  BP: 120/61  Pulse: 73  Temp: 97.5 F (36.4 C)  TempSrc: Oral  Resp: 20  Height: 5\' 6"  (1.676 m)  Weight: 206 lb 3.2 oz (93.532 kg)   Body mass index is 33.3 kg/(m^2). Physical Exam    In general this a pleasant elderly female in no distress sitting comfortably in her wheelchair.  Her skin is warm and dry she does have scattered bruising left lower extremity also history of right upper quadrant bruising.  Eyes sclerae and conjunctivae clear visual acuity appears grossly intact.  Oropharynx  clear mucous membranes moist.  Chest clear to auscultation there is no labored breathing  Heart is regular rate and rhythm without murmur gallop or rub she does not really have significant lower extremity edema.  Abdomen is obese soft nontender with positive bowel sounds.  Musculoskeletal is able to move all extremities 4 continues to have some bruising again most prominently of her left knee with slight edema and tenderness to palpation especially the lateral left knee area.  Is able to flex and extend old to have some discomfort at the knee area.  I do not really note significant lower extremity edema.  Neurologic is grossly intact her speech is clear no lateralizing findings. Does have some lower extremity weakness    Labs reviewed:  Recent Labs  11/22/15 0805 11/24/15 0441 12/17/15 1230  NA 130* 128* 130*  K 5.4* 5.1 4.4  CL 93* 95* 91*  CO2 31 28 31   GLUCOSE 100* 96 120*  BUN 13 12 6   CREATININE 0.79 0.77 0.72  CALCIUM 8.8* 8.4* 8.3*    Recent Labs  08/15/15 0900 11/07/15 0630 12/17/15 1230  AST 16 13* 17  ALT 15 13* 14  ALKPHOS 83 70 81  BILITOT 0.6 0.4 0.6  PROT 7.3 7.0 6.5  ALBUMIN 3.7 3.7 3.2*    Recent Labs  10/12/15 0815 11/07/15 0630 12/17/15 1230  WBC 9.0 9.6 10.6*  NEUTROABS 4.3 5.1 7.1  HGB 11.7* 12.5 12.1  HCT 35.2* 37.4 36.3  MCV 91.7 92.6 93.8  PLT 382 423* 355   Lab Results  Component Value Date   TSH 1.014 08/15/2015   Lab Results  Component Value Date   HGBA1C 6.1* 08/15/2015   Lab Results  Component Value Date   CHOL 185 07/23/2015   HDL 45 07/23/2015   LDLCALC 96 07/23/2015   TRIG 218* 07/23/2015   CHOLHDL 4.1 07/23/2015    Significant Diagnostic Results in last 30 days:  Ct Abdomen Pelvis Wo Contrast  12/17/2015  CLINICAL DATA:  Recent falls with pain, initial encounter EXAM: CT CHEST, ABDOMEN AND PELVIS WITHOUT CONTRAST TECHNIQUE: Multidetector CT imaging of the chest, abdomen and pelvis was performed following  the standard protocol  without IV contrast. COMPARISON:  None. FINDINGS: CT CHEST FINDINGS Mediastinum/Lymph Nodes: No masses or pathologically enlarged lymph nodes identified on this un-enhanced exam. Cardiovascular: Mild aortic calcifications are noted without aneurysmal dilatation. No significant coronary calcifications are noted. Lungs/Pleura: No pulmonary mass, infiltrate, or effusion. Musculoskeletal: Degenerative changes of the thoracic spine are noted. No acute bony abnormality is seen. Soft tissue changes are noted in the medial aspect of the right breast which may be related to recent fall. No definitive hematoma is seen. CT ABDOMEN PELVIS FINDINGS Hepatobiliary: The gallbladder has been surgically removed. The liver is within normal limits. Pancreas: No mass or inflammatory process identified on this un-enhanced exam. Spleen: Within normal limits in size. Adrenals/Urinary Tract: No evidence of urolithiasis or hydronephrosis. No definite mass visualized on this un-enhanced exam. Bladder is within normal limits. Stomach/Bowel: No evidence of obstruction, inflammatory process, or abnormal fluid collections. The appendix has been surgically removed Vascular/Lymphatic: No pathologically enlarged lymph nodes. No evidence of abdominal aortic aneurysm. Reproductive: The uterus has been surgically removed. Other: None. Musculoskeletal:  No suspicious bone lesions identified. IMPRESSION: Minimal soft tissue changes in the medial aspect of the right breast which may be related to the recent injury. No acute abnormality is identified. Electronically Signed   By: Inez Catalina M.D.   On: 12/17/2015 14:10   Dg Ankle Complete Left  12/17/2015  CLINICAL DATA:  Pain following recent falls EXAM: LEFT ANKLE COMPLETE - 3+ VIEW COMPARISON:  May 24, 2014 FINDINGS: Frontal, oblique, lateral views were obtained. There is soft tissue swelling medially. There is no demonstrable fracture or joint effusion. The ankle mortise  appears intact. The joint spaces appear unremarkable. There is a spur along the inferior calcaneus. IMPRESSION: Soft tissue swelling medially. No fracture. Mortise appears intact. There is an inferior calcaneal spur. Electronically Signed   By: Lowella Grip III M.D.   On: 12/17/2015 11:15   Ct Chest Wo Contrast  12/17/2015  CLINICAL DATA:  Recent falls with pain, initial encounter EXAM: CT CHEST, ABDOMEN AND PELVIS WITHOUT CONTRAST TECHNIQUE: Multidetector CT imaging of the chest, abdomen and pelvis was performed following the standard protocol without IV contrast. COMPARISON:  None. FINDINGS: CT CHEST FINDINGS Mediastinum/Lymph Nodes: No masses or pathologically enlarged lymph nodes identified on this un-enhanced exam. Cardiovascular: Mild aortic calcifications are noted without aneurysmal dilatation. No significant coronary calcifications are noted. Lungs/Pleura: No pulmonary mass, infiltrate, or effusion. Musculoskeletal: Degenerative changes of the thoracic spine are noted. No acute bony abnormality is seen. Soft tissue changes are noted in the medial aspect of the right breast which may be related to recent fall. No definitive hematoma is seen. CT ABDOMEN PELVIS FINDINGS Hepatobiliary: The gallbladder has been surgically removed. The liver is within normal limits. Pancreas: No mass or inflammatory process identified on this un-enhanced exam. Spleen: Within normal limits in size. Adrenals/Urinary Tract: No evidence of urolithiasis or hydronephrosis. No definite mass visualized on this un-enhanced exam. Bladder is within normal limits. Stomach/Bowel: No evidence of obstruction, inflammatory process, or abnormal fluid collections. The appendix has been surgically removed Vascular/Lymphatic: No pathologically enlarged lymph nodes. No evidence of abdominal aortic aneurysm. Reproductive: The uterus has been surgically removed. Other: None. Musculoskeletal:  No suspicious bone lesions identified. IMPRESSION:  Minimal soft tissue changes in the medial aspect of the right breast which may be related to the recent injury. No acute abnormality is identified. Electronically Signed   By: Inez Catalina M.D.   On: 12/17/2015 14:10   Dg Knee Complete 4  Views Left  12/17/2015  CLINICAL DATA:  Fall on Saturday pain and swelling left knee and left ankle EXAM: LEFT KNEE - COMPLETE 4+ VIEW COMPARISON:  None. FINDINGS: Four views of the left knee submitted. No acute fracture or subluxation. Small joint effusion. There is diffuse osteopenia. Narrowing of medial joint compartment. IMPRESSION: No acute fracture or subluxation. Small joint effusion. Diffuse osteopenia. Mild degenerative changes. Electronically Signed   By: Lahoma Crocker M.D.   On: 12/17/2015 11:18    Assessment/Plan 1. Essential hypertension This appears stable recent blood pressures 120/61-135/77-she continues on Lopressor 25 mg twice a day she is also on spironolactone 25 mg once a day.  With history of hyponatremia will update a metabolic panel prior to discharge.    2. Rheumatoid arthritis involving ankle, unspecified laterality, unspecified rheumatoid factor presence [facility]) Chief with her today she is under the impression she is no longer receiving her Plaquenil--blood per chart review and speaking with nursing she is receiving this-she is followed by rheumatology she will need follow-up as warranted.  #3 hypernatremia again this appears relatively stable at 1:30 almost recent lab on 12/17/2015 will update this before discharge.  4 history recent fall-Dr. Linna Darner reviewed medication list on admission and risks were discussed with patient she is on Xanax 1 mg 3 times a day- hydrocodone on generic Robaxin and Ambien however patient has been on this for some time and has been quite adamant about maintaining these.  Orthopedic consult is pending for complaintsof continued discomfort-she does receive Norco 7. 5-325  milligrams every 6 hours when  necessary-she is also receiving arthritis hot pain relief topical as needed to the left leg.  #5 history of CVA with minimal residual deficits-she is on Plavix again will need consultation at some point with neurology about necessity to continue this especially if she has a history of recurrent falls.  #6-history of allergic rhinitis continues on Claritin routinely this appears to be relatively stable  Of note patient will need continued PT and OT for strengthening as well as as an expedient orthopedic consult which is pending-will be home by herself and feels confident about this-will update a CBC and metabolic panel before discharge.  [id number] note greater than 30 minutes spent preparing this discharge summary-greater than 50% of time spent coordinating plan of care      [place] Lewis, [place] [phone]

## 2015-12-26 ENCOUNTER — Encounter: Payer: Self-pay | Admitting: Internal Medicine

## 2015-12-26 ENCOUNTER — Encounter (HOSPITAL_COMMUNITY)
Admission: RE | Admit: 2015-12-26 | Discharge: 2015-12-26 | Disposition: A | Discharge status: Home / Self Care | Payer: Medicare Other | Source: Skilled Nursing Facility | Attending: Internal Medicine | Admitting: Internal Medicine

## 2015-12-26 ENCOUNTER — Ambulatory Visit (HOSPITAL_COMMUNITY): Payer: Medicare Other

## 2015-12-26 ENCOUNTER — Non-Acute Institutional Stay (SKILLED_NURSING_FACILITY): Payer: Medicare Other | Admitting: Internal Medicine

## 2015-12-26 DIAGNOSIS — M797 Fibromyalgia: Secondary | ICD-10-CM | POA: Insufficient documentation

## 2015-12-26 DIAGNOSIS — E871 Hypo-osmolality and hyponatremia: Secondary | ICD-10-CM

## 2015-12-26 DIAGNOSIS — I10 Essential (primary) hypertension: Secondary | ICD-10-CM | POA: Insufficient documentation

## 2015-12-26 DIAGNOSIS — E875 Hyperkalemia: Secondary | ICD-10-CM

## 2015-12-26 DIAGNOSIS — H04129 Dry eye syndrome of unspecified lacrimal gland: Secondary | ICD-10-CM | POA: Insufficient documentation

## 2015-12-26 DIAGNOSIS — I6350 Cerebral infarction due to unspecified occlusion or stenosis of unspecified cerebral artery: Secondary | ICD-10-CM | POA: Insufficient documentation

## 2015-12-26 DIAGNOSIS — K219 Gastro-esophageal reflux disease without esophagitis: Secondary | ICD-10-CM | POA: Insufficient documentation

## 2015-12-26 LAB — BASIC METABOLIC PANEL
ANION GAP: 8 (ref 5–15)
BUN: 7 mg/dL (ref 6–20)
CHLORIDE: 94 mmol/L — AB (ref 101–111)
CO2: 28 mmol/L (ref 22–32)
Calcium: 8.5 mg/dL — ABNORMAL LOW (ref 8.9–10.3)
Creatinine, Ser: 0.67 mg/dL (ref 0.44–1.00)
GFR calc Af Amer: >60 mL/min (ref >60)
GLUCOSE: 105 mg/dL — AB (ref 65–99)
POTASSIUM: 5.5 mmol/L — AB (ref 3.5–5.1)
Sodium: 130 mmol/L — ABNORMAL LOW (ref 135–145)

## 2015-12-26 LAB — CBC WITH DIFFERENTIAL/PLATELET
BASOS ABS: 0 10*3/uL (ref 0.0–0.1)
Basophils Relative: 1 %
Eosinophils Absolute: 0.2 10*3/uL (ref 0.0–0.7)
Eosinophils Relative: 2 %
HEMATOCRIT: 36.3 % (ref 36.0–46.0)
HEMOGLOBIN: 12.1 g/dL (ref 12.0–15.0)
LYMPHS ABS: 2.7 10*3/uL (ref 0.7–4.0)
LYMPHS PCT: 34 %
MCH: 30.8 pg (ref 26.0–34.0)
MCHC: 33.3 g/dL (ref 30.0–36.0)
MCV: 92.4 fL (ref 78.0–100.0)
Monocytes Absolute: 0.8 10*3/uL (ref 0.1–1.0)
Monocytes Relative: 10 %
NEUTROS ABS: 4.3 10*3/uL (ref 1.7–7.7)
Neutrophils Relative %: 53 %
Platelets: 477 10*3/uL — ABNORMAL HIGH (ref 150–400)
RBC: 3.93 MIL/uL (ref 3.87–5.11)
RDW: 12.8 % (ref 11.5–15.5)
WBC: 8.1 10*3/uL (ref 4.0–10.5)

## 2015-12-26 NOTE — Progress Notes (Signed)
Location:   [facility] Room Number: 115/W Place of Service:  SNF [phone]) Provider:  Amado Coe., MD  Patient Care Team: Redmond School, MD as PCP - General (Internal Medicine)  Extended Emergency Contact Information Primary Emergency Contact: [facility] Address: [address]          [place], [place] [address] [name] of [place] Phone: [phone] Mobile Phone: [phone] Relation: Brother  Code Status:  Full Code Goals of care: Advanced Directive information Advanced Directives 12/26/2015  Does patient have an advance directive? Yes  Type of Advance Directive (No Data)  Does patient want to make changes to advanced directive? No - Patient declined  Copy of advanced directive(s) in chart? Yes     Chief Complaint  Patient presents with  . Acute Visit    High Potassium    HPI:  Pt is a 70 y.o. female seen today for an acute visit forRoutine lab which shows a mildly elevated potassium of 5.5-she does have chronic hyponatremia at 1:30 this appears to be stable.  She is slated to be discharged again this was a routine labs clinically she appears to be stable.  She does states she does eat a banana every morning and we have encouraged her not to do this secondary the potassium and she has expressed agreement. She will also need to avoid other high potassium foods area  Her renal function appears stable with a creatinine of 0.67 BUN of 7.      Past Medical History  Diagnosis Date  . Neuropathy ([place])   . Hypertension   . Anxiety   . Depression   . Acid reflux   . Rheumatoid arthritis ([place])   . Fibromyalgia   . Stroke ([place]) 02/17/14  . CVA (cerebral infarction) 02/19/2014    Acute left thalamic   Past Surgical History  Procedure Laterality Date  . Back surgery    . Knee surgery    . Ankle reconstruction    . Abdominal hysterectomy    . Appendectomy    . Cholecystectomy      Allergies  Allergen Reactions    . Demerol [Meperidine] Anaphylaxis  . Hydromorphone Anaphylaxis  . Penicillins Anaphylaxis    Has patient had a PCN reaction causing immediate rash, facial/tongue/throat swelling, SOB or lightheadedness with hypotension: Yes Has patient had a PCN reaction causing severe rash involving mucus membranes or skin necrosis: No Has patient had a PCN reaction that required hospitalization No Has patient had a PCN reaction occurring within the last 10 years: No If all of the above answers are "NO", then may proceed with Cephalosporin use.   . Sulfa Antibiotics Nausea And Vomiting  . Bisphosphonates     GI intolerance  . Fentanyl Other (See Comments)    "felt like I had demons in my head"  . Iohexol   . Percocet [Oxycodone-Acetaminophen] Swelling    Mouth swelling.  . Shellfish Allergy     Glucosamine not an option  . Terramycin [Oxytetracycline] Nausea And Vomiting  . Ciprofloxacin Nausea Only and Rash  . Levaquin [Levofloxacin In D5w] Nausea And Vomiting and Rash  . Morphine And Related Rash    Outpatient Encounter Prescriptions as of 12/26/2015  Medication Sig  . acetaminophen (TYLENOL) 325 MG tablet Take 650 mg by mouth daily. May give a dose of Hydrocodone/APAP 7.5/5 prn along with this if needed  . ALPRAZolam (XANAX) 1 MG tablet Take one tablet three times daily. Monitor for frequent complaints,  frequent movements SOB document behavior  . Cholecalciferol (VITAMIN D3) 2000 units TABS Take 1 tablet by mouth daily. Take along with 5,000 unit to have 7,000  . Cholecalciferol 5000 units TABS Will take 5,000 unit along with 2,000 unit daily for a 7,000 unit daily  . clopidogrel (PLAVIX) 75 MG tablet Take 75 mg by mouth at bedtime.  [name] Kitchen desloratadine (CLARINEX) 5 MG tablet Take 5 mg by mouth daily.  [name] Kitchen dicyclomine (BENTYL) 20 MG tablet Take 20 mg by mouth every 6 (six) hours as needed. Abdominal cramps  . docusate sodium (COLACE) 100 MG capsule Take 100 mg by mouth 2 (two) times daily as needed  for mild constipation.   [name] Kitchen HYDROcodone-acetaminophen (NORCO/VICODIN) 5-325 MG tablet Take 1 tablet by mouth every 6 (six) hours as needed for moderate pain.  . hydroxychloroquine (PLAQUENIL) 200 MG tablet Take 200 mg by mouth daily.   . lansoprazole (PREVACID) 30 MG capsule Take 30 mg by mouth 2 (two) times daily.  . methocarbamol (ROBAXIN) 750 MG tablet Take 750 mg by mouth every 6 (six) hours as needed for muscle spasms.  . METHYL SALICYLATE-LIDO-MENTHOL EX Arthritis hot pain relief apply thin layer 15-10 % three times a to left leg as needed  . metoprolol tartrate (LOPRESSOR) 25 MG tablet Take 1 tablet (25 mg total) by mouth 2 (two) times daily.  . ondansetron (ZOFRAN) 4 MG tablet Take 4 mg by mouth every 6 (six) hours as needed. Give with plaquenil daily at 10 am. Give 4 mg as needed for N / V  . PARoxetine (PAXIL) 40 MG tablet Take 1 tablet by mouth daily.  [name] Kitchen Propylene Glycol 0.6 % SOLN Place 1 drop into both eyes daily. At noon.  . RESTASIS 0.05 % ophthalmic emulsion Place 1 drop into both eyes 2 (two) times daily.  . simethicone (MYLICON) 0000000 MG chewable tablet Chew 125 mg by mouth at bedtime. And as needed.  . sodium chloride (OCEAN) 0.65 % SOLN nasal spray Place 1 spray into both nostrils as needed for congestion.  [name] Kitchen spironolactone (ALDACTONE) 25 MG tablet Take 25 mg by mouth daily.  [name] Kitchen zolpidem (AMBIEN CR) 12.5 MG CR tablet Take 1 tablet (12.5 mg total) by mouth at bedtime as needed for sleep.   No facility-administered encounter medications on file as of 12/26/2015.     Review of Systems  General she is not complaining fever chills continues complain at times of some left leg pain she has an orthopedic consult pending.  Skin not complain of rashes or itching does have some bruising apparently of her right thorax and left knee area.  X-rays not complaining shortness breath or cough.  Cardiac does not complain chest pain or palpitations.  GU 0 probably not complaining of any  abdominal discomfort nausea vomiting diarrhea or constipation.  Neurologic is not complaining of dizziness headache or syncopal-type feelings.      Immunization History  Administered Date(s) Administered  . PPD Test 12/17/2015   Pertinent  Health Maintenance Due  Topic Date Due  . PNA vac Low Risk Adult (1 of 2 - PCV13) 11/13/2016 (Originally 06/14/2010)  . MAMMOGRAM  11/13/2017 (Originally 06/15/1995)  . COLONOSCOPY  11/13/2025 (Originally 06/15/1995)  . INFLUENZA VACCINE  01/08/2016  . DEXA SCAN  Completed   No flowsheet data found. Functional Status Survey:    Filed Vitals:   12/26/15 1540  Pulse: 66  Resp: 18   There is no weight on file to calculate BMI. Physical Exam  In general  this is a pleasant elderly female in no distress sitting comfortably in her wheelchair.  Her skin is warm and dry does continue with some bruising of her left lower extremity right upper quadrant bruising apparently persists as well although I did not assess that today.  Eyes pupils appear reactive light sclera and conjunctiva clear visual acuity appears grossly intact.  Her chest is clear to auscultation.  Heart is regular rate and rhythm without murmur gallop or rub pulse of 68 on exam.  Neurologic is grossly intact no lateralizing findings her speech is clear.  Psych she continues to be alert and oriented pleasant and appropriate   Labs reviewed:  Recent Labs  11/24/15 0441 12/17/15 1230 12/26/15 0720  NA 128* 130* 130*  K 5.1 4.4 5.5*  CL 95* 91* 94*  CO2 28 31 28   GLUCOSE 96 120* 105*  BUN 12 6 7   CREATININE 0.77 0.72 0.67  CALCIUM 8.4* 8.3* 8.5*    Recent Labs  08/15/15 0900 11/07/15 0630 12/17/15 1230  AST 16 13* 17  ALT 15 13* 14  ALKPHOS 83 70 81  BILITOT 0.6 0.4 0.6  PROT 7.3 7.0 6.5  ALBUMIN 3.7 3.7 3.2*    Recent Labs  11/07/15 0630 12/17/15 1230 12/26/15 0720  WBC 9.6 10.6* 8.1  NEUTROABS 5.1 7.1 4.3  HGB 12.5 12.1 12.1  HCT 37.4 36.3 36.3  MCV  92.6 93.8 92.4  PLT 423* 355 477*   Lab Results  Component Value Date   TSH 1.014 08/15/2015   Lab Results  Component Value Date   HGBA1C 6.1* 08/15/2015   Lab Results  Component Value Date   CHOL 185 07/23/2015   HDL 45 07/23/2015   LDLCALC 96 07/23/2015   TRIG 218* 07/23/2015   CHOLHDL 4.1 07/23/2015    Significant Diagnostic Results in last 30 days:  Ct Abdomen Pelvis Wo Contrast  12/17/2015  CLINICAL DATA:  Recent falls with pain, initial encounter EXAM: CT CHEST, ABDOMEN AND PELVIS WITHOUT CONTRAST TECHNIQUE: Multidetector CT imaging of the chest, abdomen and pelvis was performed following the standard protocol without IV contrast. COMPARISON:  None. FINDINGS: CT CHEST FINDINGS Mediastinum/Lymph Nodes: No masses or pathologically enlarged lymph nodes identified on this un-enhanced exam. Cardiovascular: Mild aortic calcifications are noted without aneurysmal dilatation. No significant coronary calcifications are noted. Lungs/Pleura: No pulmonary mass, infiltrate, or effusion. Musculoskeletal: Degenerative changes of the thoracic spine are noted. No acute bony abnormality is seen. Soft tissue changes are noted in the medial aspect of the right breast which may be related to recent fall. No definitive hematoma is seen. CT ABDOMEN PELVIS FINDINGS Hepatobiliary: The gallbladder has been surgically removed. The liver is within normal limits. Pancreas: No mass or inflammatory process identified on this un-enhanced exam. Spleen: Within normal limits in size. Adrenals/Urinary Tract: No evidence of urolithiasis or hydronephrosis. No definite mass visualized on this un-enhanced exam. Bladder is within normal limits. Stomach/Bowel: No evidence of obstruction, inflammatory process, or abnormal fluid collections. The appendix has been surgically removed Vascular/Lymphatic: No pathologically enlarged lymph nodes. No evidence of abdominal aortic aneurysm. Reproductive: The uterus has been surgically  removed. Other: None. Musculoskeletal:  No suspicious bone lesions identified. IMPRESSION: Minimal soft tissue changes in the medial aspect of the right breast which may be related to the recent injury. No acute abnormality is identified. Electronically Signed   By: Inez Catalina M.D.   On: 12/17/2015 14:10   Dg Ankle Complete Left  12/17/2015  CLINICAL DATA:  Pain following  recent falls EXAM: LEFT ANKLE COMPLETE - 3+ VIEW COMPARISON:  May 24, 2014 FINDINGS: Frontal, oblique, lateral views were obtained. There is soft tissue swelling medially. There is no demonstrable fracture or joint effusion. The ankle mortise appears intact. The joint spaces appear unremarkable. There is a spur along the inferior calcaneus. IMPRESSION: Soft tissue swelling medially. No fracture. Mortise appears intact. There is an inferior calcaneal spur. Electronically Signed   By: Lowella Grip III M.D.   On: 12/17/2015 11:15   Ct Chest Wo Contrast  12/17/2015  CLINICAL DATA:  Recent falls with pain, initial encounter EXAM: CT CHEST, ABDOMEN AND PELVIS WITHOUT CONTRAST TECHNIQUE: Multidetector CT imaging of the chest, abdomen and pelvis was performed following the standard protocol without IV contrast. COMPARISON:  None. FINDINGS: CT CHEST FINDINGS Mediastinum/Lymph Nodes: No masses or pathologically enlarged lymph nodes identified on this un-enhanced exam. Cardiovascular: Mild aortic calcifications are noted without aneurysmal dilatation. No significant coronary calcifications are noted. Lungs/Pleura: No pulmonary mass, infiltrate, or effusion. Musculoskeletal: Degenerative changes of the thoracic spine are noted. No acute bony abnormality is seen. Soft tissue changes are noted in the medial aspect of the right breast which may be related to recent fall. No definitive hematoma is seen. CT ABDOMEN PELVIS FINDINGS Hepatobiliary: The gallbladder has been surgically removed. The liver is within normal limits. Pancreas: No mass or  inflammatory process identified on this un-enhanced exam. Spleen: Within normal limits in size. Adrenals/Urinary Tract: No evidence of urolithiasis or hydronephrosis. No definite mass visualized on this un-enhanced exam. Bladder is within normal limits. Stomach/Bowel: No evidence of obstruction, inflammatory process, or abnormal fluid collections. The appendix has been surgically removed Vascular/Lymphatic: No pathologically enlarged lymph nodes. No evidence of abdominal aortic aneurysm. Reproductive: The uterus has been surgically removed. Other: None. Musculoskeletal:  No suspicious bone lesions identified. IMPRESSION: Minimal soft tissue changes in the medial aspect of the right breast which may be related to the recent injury. No acute abnormality is identified. Electronically Signed   By: Inez Catalina M.D.   On: 12/17/2015 14:10   Dg Knee Complete 4 Views Left  12/17/2015  CLINICAL DATA:  Fall on Saturday pain and swelling left knee and left ankle EXAM: LEFT KNEE - COMPLETE 4+ VIEW COMPARISON:  None. FINDINGS: Four views of the left knee submitted. No acute fracture or subluxation. Small joint effusion. There is diffuse osteopenia. Narrowing of medial joint compartment. IMPRESSION: No acute fracture or subluxation. Small joint effusion. Diffuse osteopenia. Mild degenerative changes. Electronically Signed   By: Lahoma Crocker M.D.   On: 12/17/2015 11:18    Assessment/Plan . #1 hyperkalemia-she does not really appear to have significant medications contributing to this-we have talked her about dietary changes and not taking a banana in the morning-she is aware of that will update a lab as well tomorrow a.m. to keep an eye on this possibly there could be some hemolysis involved here and would like to clarify that.  Clinically she appears to be doing well again she is slated for discharge  Hyponatremia appears to be chronic and stable will need follow-up by primary care  provider  [id number]  [place], [place], [place]

## 2015-12-27 ENCOUNTER — Encounter (HOSPITAL_COMMUNITY)
Admission: RE | Admit: 2015-12-27 | Discharge: 2015-12-27 | Disposition: A | Discharge status: Home / Self Care | Payer: Medicare Other | Source: Skilled Nursing Facility | Attending: Internal Medicine | Admitting: Internal Medicine

## 2015-12-27 LAB — BASIC METABOLIC PANEL
Anion gap: 9 (ref 5–15)
BUN: 8 mg/dL (ref 6–20)
CALCIUM: 8.6 mg/dL — AB (ref 8.9–10.3)
CO2: 28 mmol/L (ref 22–32)
CREATININE: 0.7 mg/dL (ref 0.44–1.00)
Chloride: 91 mmol/L — ABNORMAL LOW (ref 101–111)
GFR calc Af Amer: >60 mL/min (ref >60)
Glucose, Bld: 106 mg/dL — ABNORMAL HIGH (ref 65–99)
Potassium: 5.1 mmol/L (ref 3.5–5.1)
SODIUM: 128 mmol/L — AB (ref 135–145)

## 2016-01-11 ENCOUNTER — Encounter (HOSPITAL_COMMUNITY)
Admission: AD | Admit: 2016-01-11 | Discharge: 2016-01-11 | Disposition: A | Discharge status: Home / Self Care | Payer: Medicare Other | Source: Skilled Nursing Facility | Attending: Internal Medicine | Admitting: Internal Medicine

## 2016-01-11 DIAGNOSIS — E876 Hypokalemia: Secondary | ICD-10-CM | POA: Insufficient documentation

## 2016-01-11 DIAGNOSIS — F419 Anxiety disorder, unspecified: Secondary | ICD-10-CM | POA: Insufficient documentation

## 2016-01-11 DIAGNOSIS — G894 Chronic pain syndrome: Secondary | ICD-10-CM | POA: Insufficient documentation

## 2016-01-11 DIAGNOSIS — M069 Rheumatoid arthritis, unspecified: Secondary | ICD-10-CM | POA: Insufficient documentation

## 2016-01-11 DIAGNOSIS — E785 Hyperlipidemia, unspecified: Secondary | ICD-10-CM | POA: Insufficient documentation

## 2016-01-11 DIAGNOSIS — M797 Fibromyalgia: Secondary | ICD-10-CM | POA: Insufficient documentation

## 2016-03-13 ENCOUNTER — Ambulatory Visit: Payer: Medicare Other | Admitting: Rheumatology

## 2016-03-20 ENCOUNTER — Telehealth (HOSPITAL_COMMUNITY): Payer: Self-pay | Admitting: *Deleted

## 2016-03-20 NOTE — Telephone Encounter (Signed)
spoke with patient, she said she will see her doctor a week from today.   she will call us back.

## 2016-04-01 ENCOUNTER — Encounter (HOSPITAL_COMMUNITY): Payer: Self-pay | Admitting: *Deleted

## 2016-04-01 ENCOUNTER — Observation Stay (HOSPITAL_COMMUNITY)
Admission: EM | Admit: 2016-04-01 | Discharge: 2016-04-04 | Disposition: A | Discharge status: Home / Self Care | Payer: Medicare Other | Attending: Internal Medicine | Admitting: Internal Medicine

## 2016-04-01 DIAGNOSIS — D473 Essential (hemorrhagic) thrombocythemia: Secondary | ICD-10-CM | POA: Diagnosis not present

## 2016-04-01 DIAGNOSIS — Z79899 Other long term (current) drug therapy: Secondary | ICD-10-CM | POA: Insufficient documentation

## 2016-04-01 DIAGNOSIS — R404 Transient alteration of awareness: Secondary | ICD-10-CM

## 2016-04-01 DIAGNOSIS — E871 Hypo-osmolality and hyponatremia: Secondary | ICD-10-CM | POA: Diagnosis not present

## 2016-04-01 DIAGNOSIS — I10 Essential (primary) hypertension: Secondary | ICD-10-CM | POA: Insufficient documentation

## 2016-04-01 DIAGNOSIS — E876 Hypokalemia: Secondary | ICD-10-CM | POA: Diagnosis not present

## 2016-04-01 DIAGNOSIS — R21 Rash and other nonspecific skin eruption: Secondary | ICD-10-CM | POA: Diagnosis not present

## 2016-04-01 DIAGNOSIS — F13930 Sedative, hypnotic or anxiolytic use, unspecified with withdrawal, uncomplicated: Secondary | ICD-10-CM

## 2016-04-01 DIAGNOSIS — F13232 Sedative, hypnotic or anxiolytic dependence with withdrawal with perceptual disturbance: Secondary | ICD-10-CM

## 2016-04-01 DIAGNOSIS — F13230 Sedative, hypnotic or anxiolytic dependence with withdrawal, uncomplicated: Principal | ICD-10-CM | POA: Insufficient documentation

## 2016-04-01 DIAGNOSIS — D75839 Thrombocytosis, unspecified: Secondary | ICD-10-CM

## 2016-04-01 DIAGNOSIS — N390 Urinary tract infection, site not specified: Secondary | ICD-10-CM | POA: Diagnosis present

## 2016-04-01 DIAGNOSIS — R200 Anesthesia of skin: Secondary | ICD-10-CM | POA: Diagnosis present

## 2016-04-01 DIAGNOSIS — M6281 Muscle weakness (generalized): Secondary | ICD-10-CM

## 2016-04-01 DIAGNOSIS — R29898 Other symptoms and signs involving the musculoskeletal system: Secondary | ICD-10-CM

## 2016-04-01 DIAGNOSIS — R442 Other hallucinations: Secondary | ICD-10-CM

## 2016-04-01 DIAGNOSIS — F13932 Sedative, hypnotic or anxiolytic use, unspecified with withdrawal with perceptual disturbances: Secondary | ICD-10-CM

## 2016-04-01 NOTE — ED Provider Notes (Signed)
[place] DEPT Provider Note   CSN: [id number] Arrival date & time: 04/01/16  2147   By signing my name below, I, Dolores Hoose, attest that this documentation has been prepared under the direction and in the presence of Delora Fuel, MD . Electronically Signed: Dolores Hoose, Scribe. 04/01/2016. 11:56 PM.  History   Chief Complaint Chief Complaint  Patient presents with  . Numbness   The history is provided by the patient. No language interpreter was used.    HPI Comments:  Robin Blackburn is a 70 y.o. female who presents to the Emergency Department by EMS complaining of sudden-onset constant numbness beginning today. She reports that her numbness began after the neighboring apartment complex has been undergoing Architect. Pt states that she has not been taking her normal medications and this has improved her symptoms slightly. She notes that she has been able to smell smoke coming from the construction site and that it has exacerbated a rash she has had for the past few weeks, as well as caused her throat to swell. Per EMS, no smell noted insider her home. She also reports associated nausea, occipital headache, blurry vision, dizziness, weakness, exacerbated rash, decreased appetite and dysuria. She denies any vomiting. Pt also states that her medication patterns have recently changed. Her blood pressure medication was switched 3 weeks ago, which she states has caused her some heart related issues. Also, she has abruptly stopped taking her daily prescribed Xanax, due to being out of medication.   Past Medical History:  Diagnosis Date  . Acid reflux   . Anxiety   . 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 Depression   . Fibromyalgia   . Hypertension   . Neuropathy ([place])   . Rheumatoid arthritis ([place])   . Stroke [facility]) 02/17/14    Patient Active Problem List   Diagnosis Date Noted  . Acute pain of left knee 12/18/2015  . Osteoporosis 11/01/2015  .  Vitamin D deficiency 10/11/2015  . History of fracture of left ankle 09/28/2015  . Chronic pain in left foot 09/28/2015  . Fibromyalgia 09/28/2015  . Rash and nonspecific skin eruption 07/17/2015  . Cerumen impaction 05/30/2015  . Edema 04/16/2015  . Cellulitis 04/16/2015  . Mouth pain 04/08/2015  . Pain in joint, lower leg 04/08/2015  . GERD (gastroesophageal reflux disease) 04/05/2015  . Occult blood positive stool 01/17/2015  . Diarrhea 01/05/2015  . Chronic pain syndrome 12/02/2014  . Pain in joint, upper arm 10/29/2014  . Nasal congestion 10/29/2014  . Allergic rhinitis 08/20/2014  . Conjunctivitis 08/08/2014  . Dysuria 06/27/2014  . Back pain, lumbosacral 06/23/2014  . Sinusitis, chronic 06/17/2014  . Fall 05/24/2014  . H/O: CVA (cerebrovascular accident) 05/24/2014  . HLD (hyperlipidemia) 05/24/2014  . Physical deconditioning 05/24/2014  . Fracture of fifth metatarsal bone of right foot 03/06/2014  . Right sided weakness 02/26/2014  . Leukocytosis 02/20/2014  . Rheumatoid arthritis ([place]) 02/20/2014  . CVA (cerebral infarction) 02/19/2014  . Right ankle sprain 02/19/2014  . Contusion of right foot 02/19/2014  . Essential hypertension 02/19/2014  . Neuropathy ([place]) 02/19/2014  . Anxiety 02/19/2014  . Hyponatremia 02/19/2014  . Hypokalemia 02/19/2014    Past Surgical History:  Procedure Laterality Date  . ABDOMINAL HYSTERECTOMY    . ANKLE RECONSTRUCTION    . APPENDECTOMY    . BACK SURGERY    . CHOLECYSTECTOMY    . KNEE SURGERY      OB History    No  data available       Home Medications    Prior to Admission medications   Medication Sig Start Date End Date Taking? Authorizing Provider  acetaminophen (TYLENOL) 325 MG tablet Take 650 mg by mouth daily. May give a dose of Hydrocodone/APAP 7.5/5 prn along with this if needed   Yes Historical Provider, MD  ALPRAZolam Duanne Moron) 1 MG tablet Take one tablet three times daily. Monitor for frequent complaints,  frequent movements SOB document behavior 12/19/15  Yes Estill Dooms, MD  Cholecalciferol 5000 units TABS Will take 5,000 unit along with 2,000 unit daily for a 7,000 unit daily   Yes Historical Provider, MD  HYDROcodone-acetaminophen (NORCO) 7.5-325 MG tablet Take 1 tablet by mouth every 6 (six) hours as needed. 03/12/16  Yes Historical Provider, MD  methocarbamol (ROBAXIN) 750 MG tablet Take 375-750 mg by mouth every 6 (six) hours as needed for muscle spasms.    Yes Historical Provider, MD  clopidogrel (PLAVIX) 75 MG tablet Take 75 mg by mouth at bedtime. 05/03/14   Historical Provider, MD  desloratadine (CLARINEX) 5 MG tablet Take 5 mg by mouth daily.    Historical Provider, MD  dicyclomine (BENTYL) 20 MG tablet Take 20 mg by mouth every 6 (six) hours as needed. Abdominal cramps 01/31/14   Historical Provider, MD  docusate sodium (COLACE) 100 MG capsule Take 100 mg by mouth 2 (two) times daily as needed for mild constipation.     Historical Provider, MD  hydroxychloroquine (PLAQUENIL) 200 MG tablet Take 200 mg by mouth daily.     Historical Provider, MD  lansoprazole (PREVACID) 30 MG capsule Take 30 mg by mouth 2 (two) times daily. 02/17/14   Historical Provider, MD  METHYL SALICYLATE-LIDO-MENTHOL EX Arthritis hot pain relief apply thin layer 15-10 % three times a to left leg as needed    Historical Provider, MD  metoprolol tartrate (LOPRESSOR) 25 MG tablet Take 1 tablet (25 mg total) by mouth 2 (two) times daily. 09/28/15   Hendricks Limes, MD  ondansetron (ZOFRAN) 4 MG tablet Take 4 mg by mouth every 6 (six) hours as needed. Give with plaquenil daily at 10 am. Give 4 mg as needed for N / V    Historical Provider, MD  PARoxetine (PAXIL) 40 MG tablet Take 1 tablet by mouth daily. 12/14/15   Historical Provider, MD  Propylene Glycol 0.6 % SOLN Place 1 drop into both eyes daily. At noon.    Historical Provider, MD  RESTASIS 0.05 % ophthalmic emulsion Place 1 drop into both eyes 2 (two) times daily.  01/31/14   Historical Provider, MD  simethicone (MYLICON) 0000000 MG chewable tablet Chew 125 mg by mouth at bedtime. And as needed.    Historical Provider, MD  sodium chloride (OCEAN) 0.65 % SOLN nasal spray Place 1 spray into both nostrils as needed for congestion. 02/22/14   Rexene Alberts, MD  spironolactone (ALDACTONE) 25 MG tablet Take 25 mg by mouth daily.    Historical Provider, MD  zolpidem (AMBIEN CR) 12.5 MG CR tablet Take 1 tablet (12.5 mg total) by mouth at bedtime as needed for sleep. 10/18/15   Gayland Curry, DO    Family History Family History  Problem Relation Age of Onset  . Hypertension Father   . Transient ischemic attack Father   . Hypertension Brother   . CVA Maternal Grandfather   . Leukemia Brother     Social History Social History  Substance Use Topics  . Smoking status: Never Smoker  .  Smokeless tobacco: Never Used  . Alcohol use No     Allergies   Demerol [meperidine]; Hydromorphone; Penicillins; Sulfa antibiotics; Bisphosphonates; Fentanyl; Iohexol; Percocet [oxycodone-acetaminophen]; Shellfish allergy; Terramycin [oxytetracycline]; Ciprofloxacin; Levaquin [levofloxacin in d5w]; and Morphine and related   Review of Systems Review of Systems  Constitutional: Positive for appetite change.  Eyes: Positive for visual disturbance.  Gastrointestinal: Positive for nausea. Negative for vomiting.  Genitourinary: Positive for dysuria.  Skin: Positive for rash.  Neurological: Positive for dizziness, weakness, numbness and headaches.  All other systems reviewed and are negative.    Physical Exam Updated Vital Signs BP 157/76 (BP Location: Left Arm)   Pulse 62   Temp 98.7 F (37.1 C) (Oral)   Resp 21   Ht 5\' 6"  (1.676 m)   Wt 187 lb (84.8 kg)   SpO2 94%   BMI 30.18 kg/m   Physical Exam  Constitutional: She is oriented to person, place, and time. She appears well-developed and well-nourished.  HENT:  Head: Normocephalic and atraumatic.  TM normal    Eyes: EOM are normal. Pupils are equal, round, and reactive to light.  Fundi normal  Neck: Normal range of motion. Neck supple. No JVD present.  Cardiovascular: Normal rate, regular rhythm and normal heart sounds.   No murmur heard. Pulmonary/Chest: Effort normal and breath sounds normal. She has no wheezes. She has no rales. She exhibits no tenderness.  Abdominal: Soft. Bowel sounds are normal. She exhibits no distension and no mass. There is no tenderness.  Musculoskeletal: Normal range of motion. She exhibits no edema.  Trace pretibial edema  Lymphadenopathy:    She has no cervical adenopathy.  Neurological: She is alert and oriented to person, place, and time. No cranial nerve deficit. She exhibits normal muscle tone. Coordination normal.  Skin: Skin is warm and dry. No rash noted.  Erythematous rash over anterior aspect of neck  Psychiatric: She has a normal mood and affect. Her behavior is normal. Judgment and thought content normal.  Nursing note and vitals reviewed.    ED Treatments / Results  DIAGNOSTIC STUDIES:  Oxygen Saturation is 94% on RA, low by my interpretation.    COORDINATION OF CARE:  12:09 AM Discussed treatment plan with pt at bedside which includes CAT scan and blood work and pt agreed to plan.  Labs (all labs ordered are listed, but only abnormal results are displayed) Labs Reviewed  COMPREHENSIVE METABOLIC PANEL - Abnormal; Notable for the following:       Result Value   Sodium 130 (*)    Potassium 3.3 (*)    Chloride 91 (*)    Glucose, Bld 134 (*)    Calcium 8.7 (*)    ALT 12 (*)    All other components within normal limits  CBC WITH DIFFERENTIAL/PLATELET - Abnormal; Notable for the following:    WBC 17.8 (*)    Platelets 526 (*)    Neutro Abs 12.8 (*)    Monocytes Absolute 1.7 (*)    All other components within normal limits  URINALYSIS, ROUTINE W REFLEX MICROSCOPIC (NOT AT [facility]) - Abnormal; Notable for the following:    Hgb urine dipstick  SMALL (*)    Ketones, ur TRACE (*)    Nitrite POSITIVE (*)    All other components within normal limits  URINE MICROSCOPIC-ADD ON - Abnormal; Notable for the following:    Squamous Epithelial / LPF 0-5 (*)    Bacteria, UA MANY (*)    Casts HYALINE CASTS (*)  All other components within normal limits  URINE CULTURE  TROPONIN I    EKG  EKG Interpretation  Date/Time:  Tuesday April 01 2016 22:01:39 EDT Ventricular Rate:  62 PR Interval:    QRS Duration: 100 QT Interval:  476 QTC Calculation: 484 R Axis:   27 Text Interpretation:  Sinus rhythm Short PR interval Abnormal R-wave progression, early transition Baseline wander in lead(s) II V2 V3 Nonspecific T wave abnormality When compared with ECG of 02/19/2014, HEART RATE has decreased Confirmed by Roxanne Mins  MD, Davita Sublett ([id number]) on 04/02/2016 12:04:30 AM       Radiology Ct Head Wo Contrast  Result Date: 04/02/2016 CLINICAL DATA:  Acute onset numbness today.  Throat irritation. EXAM: CT HEAD WITHOUT CONTRAST TECHNIQUE: Contiguous axial images were obtained from the base of the skull through the vertex without intravenous contrast. COMPARISON:  CT HEAD July 08, 2015 FINDINGS: BRAIN: Moderate to severe ventriculomegaly with slight disproportionate sulcal effacement at the convexities. Old LEFT thalamus infarct. Patchy to confluent supratentorial white matter hypodensities. No midline shift, mass effect, acute large vascular territory infarct or hemorrhage. No abnormal extra-axial fluid collections. VASCULAR: Minimal calcific atherosclerosis of the carotid siphons. SKULL: No skull fracture. No significant scalp soft tissue swelling. SINUSES/ORBITS: The mastoid air-cells and included paranasal sinuses are well-aerated.The included ocular globes and orbital contents are non-suspicious. OTHER: None. IMPRESSION: No acute intracranial process. Stable examination including moderate to severe global parenchymal brain volume loss of the could be a  component of normal pressure hydrocephalus. Moderate to severe chronic small vessel ischemic disease and old LEFT thalamus lacunar infarct. Electronically Signed   By: Elon Alas M.D.   On: 04/02/2016 01:42    Procedures Procedures (including critical care time)  Medications Ordered in ED Medications  cefTRIAXone (ROCEPHIN) 1 g in dextrose 5 % 50 mL IVPB (not administered)  potassium chloride SA (K-DUR,KLOR-CON) CR tablet 40 mEq (not administered)  LORazepam (ATIVAN) tablet 1 mg (1 mg Oral Given 04/02/16 0022)     Initial Impression / Assessment and Plan / ED Course  I have reviewed the triage vital signs and the nursing notes.  Pertinent labs & imaging results that were available during my care of the patient were reviewed by me and considered in my medical decision making (see chart for details).  Clinical Course   Multiple rather bizarre complaints. This coincided with abrupt withdrawal of fairly high dose benzodiazepine use and may represent benzodiazepine withdrawal. Rash on her neck is of uncertain cause but does not appear infectious. Apparent olfactory hallucinations. Screening labs are obtained, CT of head is ordered, and she is given a dose of lorazepam. Old records are reviewed, and there are no relevant past visits.  CT shows evidence of old stroke but no new findings. She does have a significant leukocytosis but without left shift. Mild hypokalemia is present and she is given potassium supplementation. Mild hyponatremia is present and not significant changed from baseline. Thrombocytosis is noted of uncertain cause. Urinalysis does show evidence of infection with many bacteria and positive nitrite. However, I doubt that her symptoms are related to the UTI. Urine is sent for culture and she is given a dose of ceftriaxone. She did not have clinical improvement with her dose of lorazepam. At this point, I feel it would be appropriate to obtain MRI to rule out stroke that was  not evident on CT scan. She also may benefit from neurology consultation. Case is discussed with Dr. Darrick Meigs of [facility] who agrees to  admit the patient under observation status.  Final Clinical Impressions(s) / ED Diagnoses   Final diagnoses:  Olfactory hallucination  Rash and nonspecific skin eruption  Benzodiazepine withdrawal without complication (HCC)  Hyponatremia  Hypokalemia  Thrombocytosis (HCC)  Urinary tract infection without hematuria, site unspecified    New Prescriptions New Prescriptions   No medications on file  I personally performed the services described in this documentation, which was scribed in my presence. The recorded information has been reviewed and is accurate.       Delora Fuel, MD [id number]

## 2016-04-01 NOTE — ED Triage Notes (Signed)
Pt brought in by [facility] for c/o pt told ems she "smelled something" in her home; upon arrival to home, ems did not smell anything; pt states she feels numb all over; pt also states she takes xanax, but one of her aides took her xanax; last dose of xanax was 3 days ago and pt takes it 4 times a day

## 2016-04-02 ENCOUNTER — Encounter (HOSPITAL_COMMUNITY): Payer: Self-pay | Admitting: *Deleted

## 2016-04-02 ENCOUNTER — Observation Stay (HOSPITAL_COMMUNITY): Payer: Medicare Other

## 2016-04-02 ENCOUNTER — Emergency Department (HOSPITAL_COMMUNITY): Payer: Medicare Other

## 2016-04-02 DIAGNOSIS — E876 Hypokalemia: Secondary | ICD-10-CM | POA: Diagnosis not present

## 2016-04-02 DIAGNOSIS — F13232 Sedative, hypnotic or anxiolytic dependence with withdrawal with perceptual disturbance: Secondary | ICD-10-CM | POA: Diagnosis not present

## 2016-04-02 DIAGNOSIS — N390 Urinary tract infection, site not specified: Secondary | ICD-10-CM | POA: Diagnosis not present

## 2016-04-02 DIAGNOSIS — E871 Hypo-osmolality and hyponatremia: Secondary | ICD-10-CM | POA: Diagnosis not present

## 2016-04-02 DIAGNOSIS — F13932 Sedative, hypnotic or anxiolytic use, unspecified with withdrawal with perceptual disturbances: Secondary | ICD-10-CM

## 2016-04-02 LAB — CBC WITH DIFFERENTIAL/PLATELET
BASOS ABS: 0 10*3/uL (ref 0.0–0.1)
Basophils Relative: 0 %
Eosinophils Absolute: 0 10*3/uL (ref 0.0–0.7)
Eosinophils Relative: 0 %
HEMATOCRIT: 41.1 % (ref 36.0–46.0)
HEMOGLOBIN: 13.8 g/dL (ref 12.0–15.0)
LYMPHS PCT: 18 %
Lymphs Abs: 3.2 10*3/uL (ref 0.7–4.0)
MCH: 30.7 pg (ref 26.0–34.0)
MCHC: 33.6 g/dL (ref 30.0–36.0)
MCV: 91.3 fL (ref 78.0–100.0)
MONO ABS: 1.7 10*3/uL — AB (ref 0.1–1.0)
Monocytes Relative: 10 %
NEUTROS ABS: 12.8 10*3/uL — AB (ref 1.7–7.7)
NEUTROS PCT: 72 %
Platelets: 526 10*3/uL — ABNORMAL HIGH (ref 150–400)
RBC: 4.5 MIL/uL (ref 3.87–5.11)
RDW: 13.3 % (ref 11.5–15.5)
WBC: 17.8 10*3/uL — ABNORMAL HIGH (ref 4.0–10.5)

## 2016-04-02 LAB — COMPREHENSIVE METABOLIC PANEL
ALBUMIN: 3.5 g/dL (ref 3.5–5.0)
ALK PHOS: 73 U/L (ref 38–126)
ALT: 11 U/L — ABNORMAL LOW (ref 14–54)
ALT: 12 U/L — AB (ref 14–54)
ANION GAP: 8 (ref 5–15)
AST: 17 U/L (ref 15–41)
AST: 18 U/L (ref 15–41)
Albumin: 3.3 g/dL — ABNORMAL LOW (ref 3.5–5.0)
Alkaline Phosphatase: 76 U/L (ref 38–126)
Anion gap: 11 (ref 5–15)
BILIRUBIN TOTAL: 0.7 mg/dL (ref 0.3–1.2)
BUN: 6 mg/dL (ref 6–20)
BUN: 6 mg/dL (ref 6–20)
CALCIUM: 8.6 mg/dL — AB (ref 8.9–10.3)
CHLORIDE: 91 mmol/L — AB (ref 101–111)
CO2: 28 mmol/L (ref 22–32)
CO2: 29 mmol/L (ref 22–32)
CREATININE: 0.62 mg/dL (ref 0.44–1.00)
Calcium: 8.7 mg/dL — ABNORMAL LOW (ref 8.9–10.3)
Chloride: 93 mmol/L — ABNORMAL LOW (ref 101–111)
Creatinine, Ser: 0.64 mg/dL (ref 0.44–1.00)
GFR calc non Af Amer: >60 mL/min (ref >60)
GLUCOSE: 134 mg/dL — AB (ref 65–99)
Glucose, Bld: 129 mg/dL — ABNORMAL HIGH (ref 65–99)
POTASSIUM: 3.2 mmol/L — AB (ref 3.5–5.1)
Potassium: 3.3 mmol/L — ABNORMAL LOW (ref 3.5–5.1)
SODIUM: 130 mmol/L — AB (ref 135–145)
Sodium: 130 mmol/L — ABNORMAL LOW (ref 135–145)
TOTAL PROTEIN: 6.7 g/dL (ref 6.5–8.1)
Total Bilirubin: 0.7 mg/dL (ref 0.3–1.2)
Total Protein: 7 g/dL (ref 6.5–8.1)

## 2016-04-02 LAB — URINALYSIS, ROUTINE W REFLEX MICROSCOPIC
Bilirubin Urine: NEGATIVE
Glucose, UA: NEGATIVE mg/dL
LEUKOCYTES UA: NEGATIVE
NITRITE: POSITIVE — AB
PH: 7.5 (ref 5.0–8.0)
Protein, ur: NEGATIVE mg/dL
SPECIFIC GRAVITY, URINE: 1.015 (ref 1.005–1.030)

## 2016-04-02 LAB — URINE MICROSCOPIC-ADD ON

## 2016-04-02 LAB — CBC
HEMATOCRIT: 40.6 % (ref 36.0–46.0)
Hemoglobin: 13.7 g/dL (ref 12.0–15.0)
MCH: 31.1 pg (ref 26.0–34.0)
MCHC: 33.7 g/dL (ref 30.0–36.0)
MCV: 92.1 fL (ref 78.0–100.0)
Platelets: 513 10*3/uL — ABNORMAL HIGH (ref 150–400)
RBC: 4.41 MIL/uL (ref 3.87–5.11)
RDW: 13.5 % (ref 11.5–15.5)
WBC: 17.7 10*3/uL — ABNORMAL HIGH (ref 4.0–10.5)

## 2016-04-02 LAB — TROPONIN I: Troponin I: <0.03 ng/mL (ref <0.03)

## 2016-04-02 LAB — OSMOLALITY: OSMOLALITY: 276 mosm/kg (ref 275–295)

## 2016-04-02 MED ORDER — ENOXAPARIN SODIUM 40 MG/0.4ML ~~LOC~~ SOLN
40.0000 mg | SUBCUTANEOUS | Status: DC
  Administered 2016-04-02 – 2016-04-04 (×3): 40 mg via SUBCUTANEOUS
  Filled 2016-04-02 (×3): qty 0.4

## 2016-04-02 MED ORDER — LORATADINE 10 MG PO TABS
10.0000 mg | ORAL_TABLET | Freq: Every day | ORAL | Status: DC
  Administered 2016-04-02 – 2016-04-04 (×3): 10 mg via ORAL
  Filled 2016-04-02 (×3): qty 1

## 2016-04-02 MED ORDER — PANTOPRAZOLE SODIUM 40 MG PO TBEC
40.0000 mg | DELAYED_RELEASE_TABLET | Freq: Every day | ORAL | Status: DC
  Administered 2016-04-02 – 2016-04-04 (×3): 40 mg via ORAL
  Filled 2016-04-02 (×3): qty 1

## 2016-04-02 MED ORDER — ONDANSETRON HCL 4 MG/2ML IJ SOLN
4.0000 mg | Freq: Four times a day (QID) | INTRAMUSCULAR | Status: DC | PRN
  Administered 2016-04-02: 4 mg via INTRAVENOUS
  Filled 2016-04-02: qty 2

## 2016-04-02 MED ORDER — CLOPIDOGREL BISULFATE 75 MG PO TABS
75.0000 mg | ORAL_TABLET | Freq: Every day | ORAL | Status: DC
  Administered 2016-04-02 – 2016-04-03 (×2): 75 mg via ORAL
  Filled 2016-04-02 (×2): qty 1

## 2016-04-02 MED ORDER — PAROXETINE HCL 20 MG PO TABS
40.0000 mg | ORAL_TABLET | Freq: Every day | ORAL | Status: DC
  Administered 2016-04-02 – 2016-04-04 (×3): 40 mg via ORAL
  Filled 2016-04-02 (×6): qty 2

## 2016-04-02 MED ORDER — CEFTRIAXONE SODIUM 1 G IJ SOLR
1.0000 g | INTRAMUSCULAR | Status: DC
  Administered 2016-04-02 – 2016-04-03 (×2): 1 g via INTRAVENOUS
  Filled 2016-04-02 (×5): qty 10

## 2016-04-02 MED ORDER — CLOTRIMAZOLE-BETAMETHASONE 1-0.05 % EX CREA
TOPICAL_CREAM | Freq: Two times a day (BID) | CUTANEOUS | Status: DC
  Administered 2016-04-02 – 2016-04-03 (×3): via TOPICAL
  Filled 2016-04-02: qty 15

## 2016-04-02 MED ORDER — SPIRONOLACTONE 25 MG PO TABS
25.0000 mg | ORAL_TABLET | Freq: Every day | ORAL | Status: DC
  Administered 2016-04-02 – 2016-04-04 (×3): 25 mg via ORAL
  Filled 2016-04-02 (×6): qty 1

## 2016-04-02 MED ORDER — CEFTRIAXONE SODIUM 1 G IJ SOLR
1.0000 g | Freq: Once | INTRAMUSCULAR | Status: AC
  Administered 2016-04-02: 1 g via INTRAVENOUS
  Filled 2016-04-02: qty 10

## 2016-04-02 MED ORDER — HYDROXYCHLOROQUINE SULFATE 200 MG PO TABS
200.0000 mg | ORAL_TABLET | Freq: Every day | ORAL | Status: DC
  Administered 2016-04-03 – 2016-04-04 (×2): 200 mg via ORAL
  Filled 2016-04-02 (×6): qty 1

## 2016-04-02 MED ORDER — LORAZEPAM 1 MG PO TABS
1.0000 mg | ORAL_TABLET | Freq: Once | ORAL | Status: AC
  Administered 2016-04-02: 1 mg via ORAL
  Filled 2016-04-02: qty 1

## 2016-04-02 MED ORDER — ZOLPIDEM TARTRATE 5 MG PO TABS: 10.0000 mg | ORAL_TABLET | Freq: Every evening | ORAL | Status: DC | PRN

## 2016-04-02 MED ORDER — POTASSIUM CHLORIDE CRYS ER 20 MEQ PO TBCR
40.0000 meq | EXTENDED_RELEASE_TABLET | Freq: Once | ORAL | Status: AC
  Administered 2016-04-02: 40 meq via ORAL
  Filled 2016-04-02: qty 2

## 2016-04-02 MED ORDER — SODIUM CHLORIDE 0.9 % IV SOLN
INTRAVENOUS | Status: DC
  Administered 2016-04-02 (×2): via INTRAVENOUS

## 2016-04-02 MED ORDER — ONDANSETRON HCL 4 MG PO TABS: 4.0000 mg | ORAL_TABLET | Freq: Four times a day (QID) | ORAL | Status: DC | PRN

## 2016-04-02 MED ORDER — CYCLOSPORINE 0.05 % OP EMUL
1.0000 [drp] | Freq: Two times a day (BID) | OPHTHALMIC | Status: DC
Start: 2016-04-02 — End: 2016-04-04
  Administered 2016-04-02 – 2016-04-04 (×5): 1 [drp] via OPHTHALMIC
  Filled 2016-04-02 (×11): qty 1

## 2016-04-02 MED ORDER — POLYVINYL ALCOHOL 1.4 % OP SOLN
1.0000 [drp] | Freq: Every day | OPHTHALMIC | Status: DC
  Administered 2016-04-02 – 2016-04-04 (×3): 1 [drp] via OPHTHALMIC
  Filled 2016-04-02: qty 15

## 2016-04-02 MED ORDER — ALPRAZOLAM 1 MG PO TABS
1.0000 mg | ORAL_TABLET | Freq: Three times a day (TID) | ORAL | Status: DC
  Administered 2016-04-02 – 2016-04-04 (×8): 1 mg via ORAL
  Filled 2016-04-02 (×6): qty 1
  Filled 2016-04-02: qty 2

## 2016-04-02 MED ORDER — HYDROCODONE-ACETAMINOPHEN 7.5-325 MG PO TABS
1.0000 | ORAL_TABLET | Freq: Four times a day (QID) | ORAL | Status: DC | PRN
  Administered 2016-04-02 – 2016-04-04 (×8): 1 via ORAL
  Filled 2016-04-02 (×8): qty 1

## 2016-04-02 MED ORDER — METOPROLOL TARTRATE 25 MG PO TABS
25.0000 mg | ORAL_TABLET | Freq: Two times a day (BID) | ORAL | Status: DC
  Administered 2016-04-02 – 2016-04-04 (×5): 25 mg via ORAL
  Filled 2016-04-02 (×5): qty 1

## 2016-04-02 MED ORDER — POTASSIUM CHLORIDE CRYS ER 20 MEQ PO TBCR
40.0000 meq | EXTENDED_RELEASE_TABLET | Freq: Three times a day (TID) | ORAL | Status: DC
  Administered 2016-04-02 – 2016-04-04 (×6): 40 meq via ORAL
  Filled 2016-04-02 (×6): qty 2

## 2016-04-02 NOTE — Progress Notes (Signed)
Pharmacy Antibiotic Note  Robin Blackburn is a 70 y.o. female admitted on 04/01/2016 with UTI.  Pharmacy has been consulted for rocephin dosing. Rocephin 1 gm given at 0400 this morning  Plan: Cont rocephin 1 gm IV q24 hours F/u cultures and clinical course  Height: 5\' 6"  (167.6 cm) Weight: 187 lb (84.8 kg) IBW/kg (Calculated) : 59.3  Temp (24hrs), Avg:98.7 F (37.1 C), Min:98.7 F (37.1 C), Max:98.7 F (37.1 C)   Recent Labs Lab 04/02/16 0014 04/02/16 0638  WBC 17.8* 17.7*  CREATININE 0.62 0.64    Estimated Creatinine Clearance: 71.8 mL/min (by C-G formula based on SCr of 0.64 mg/dL).    Allergies  Allergen Reactions  . Demerol [Meperidine] Anaphylaxis  . Hydromorphone Anaphylaxis  . Penicillins Anaphylaxis    Has patient had a PCN reaction causing immediate rash, facial/tongue/throat swelling, SOB or lightheadedness with hypotension: Yes Has patient had a PCN reaction causing severe rash involving mucus membranes or skin necrosis: No Has patient had a PCN reaction that required hospitalization No Has patient had a PCN reaction occurring within the last 10 years: No If all of the above answers are "NO", then may proceed with Cephalosporin use.   . Sulfa Antibiotics Nausea And Vomiting  . Bisphosphonates     GI intolerance  . Fentanyl Other (See Comments)    "felt like I had demons in my head"  . Iohexol   . Percocet [Oxycodone-Acetaminophen] Swelling    Mouth swelling.  . Shellfish Allergy     Glucosamine not an option  . Terramycin [Oxytetracycline] Nausea And Vomiting  . Ciprofloxacin Nausea Only and Rash  . Levaquin [Levofloxacin In D5w] Nausea And Vomiting and Rash  . Morphine And Related Rash    Antimicrobials this admission: rocephin 10/25 >>   Microbiology results: 10/25 UCx:     Thank you for allowing pharmacy to be a part of this patient's care.  Excell Seltzer Poteet 04/02/2016 7:44 AM

## 2016-04-02 NOTE — Progress Notes (Signed)
PROGRESS NOTE  Robin Blackburn  [id number] DOB: July 10, 1945 DOA: 04/01/2016 PCP: Glo Herring., MD Outpatient Specialists:  Subjective: Patient feels better, reported her sense of smell is better than yesterday.  Brief Narrative:  Robin Blackburn  is a 70 y.o. female, With history of CVA, fibromyalgia, hypertension, rheumatoid arthritis who came to the hospital with complaints of numbness of fingertips, smelling of perfumes, spaced out feeling for past 1 week. Patient says that she ran out of Xanax a week ago, and stopped taking Xanax. She has been taking Xanax 1 mg 3 times a day for more than 20 years. Also had symptoms of nausea, blurry vision, dizziness, decreased appetite. Patient also complains of dysuria, no chest pain or shortness of breath. She also has a rash on her neck, which has been present before stopping Xanax.  Assessment & Plan:   Active Problems:   Hyponatremia   Hypokalemia   Urinary tract infection without hematuria   UTI (urinary tract infection)   Benzodiazepine withdrawal with perceptual disturbance [facility])   This is a no charge note, Patient admitted earlier today by my colleague Dr. Darrick Meigs. Admitted for hyponatremia, hypokalemia and UTI along with benzodiazepine withdrawal.   DVT prophylaxis:  Code Status: Full Code Family Communication:  Disposition Plan:  Diet: Diet regular Room service appropriate? Yes; Fluid consistency: Thin  Consultants:   None  Procedures:   None  Antimicrobials:   None   Objective: Vitals:   04/02/16 0930 04/02/16 1130 04/02/16 1134 04/02/16 1218  BP: 117/80 127/77  (!) 122/59  Pulse: 65 65  63  Resp: 18 18  18   Temp:   98.1 F (36.7 C) 99.1 F (37.3 C)  TempSrc:   Oral Oral  SpO2: 97% 98%  99%  Weight:    92.9 kg (204 lb 12.8 oz)  Height:    5\' 6"  (1.676 m)   No intake or output data in the 24 hours ending 04/02/16 1322 Filed Weights   04/01/16 2210 04/02/16 1218  Weight: 84.8 kg (187 lb) 92.9 kg (204 lb 12.8  oz)    Examination: General exam: Appears calm and comfortable  Respiratory system: Clear to auscultation. Respiratory effort normal. Cardiovascular system: S1 & S2 heard, RRR. No JVD, murmurs, rubs, gallops or clicks. No pedal edema. Gastrointestinal system: Abdomen is nondistended, soft and nontender. No organomegaly or masses felt. Normal bowel sounds heard. Central nervous system: Alert and oriented. No focal neurological deficits. Extremities: Symmetric 5 x 5 power. Skin: No rashes, lesions or ulcers Psychiatry: Judgement and insight appear normal. Mood & affect appropriate.   Data Reviewed: I have personally reviewed following labs and imaging studies  CBC:  Recent Labs Lab 04/02/16 0014 04/02/16 0638  WBC 17.8* 17.7*  NEUTROABS 12.8*  --   HGB 13.8 13.7  HCT 41.1 40.6  MCV 91.3 92.1  PLT 526* [id number]*   Basic Metabolic Panel:  Recent Labs Lab 04/02/16 0014 04/02/16 0638  NA 130* 130*  K 3.3* 3.2*  CL 91* 93*  CO2 28 29  GLUCOSE 134* 129*  BUN 6 6  CREATININE 0.62 0.64  CALCIUM 8.7* 8.6*   GFR: Estimated Creatinine Clearance: 75.1 mL/min (by C-G formula based on SCr of 0.64 mg/dL). Liver Function Tests:  Recent Labs Lab 04/02/16 0014 04/02/16 0638  AST 18 17  ALT 12* 11*  ALKPHOS 76 73  BILITOT 0.7 0.7  PROT 7.0 6.7  ALBUMIN 3.5 3.3*   No results for input(s): LIPASE, AMYLASE in the last 168 hours. No  results for input(s): AMMONIA in the last 168 hours. Coagulation Profile: No results for input(s): INR, PROTIME in the last 168 hours. Cardiac Enzymes:  Recent Labs Lab 04/02/16 0014  TROPONINI <0.03   BNP (last 3 results) No results for input(s): PROBNP in the last 8760 hours. HbA1C: No results for input(s): HGBA1C in the last 72 hours. CBG: No results for input(s): GLUCAP in the last 168 hours. Lipid Profile: No results for input(s): CHOL, HDL, LDLCALC, TRIG, CHOLHDL, LDLDIRECT in the last 72 hours. Thyroid Function Tests: No results for  input(s): TSH, T4TOTAL, FREET4, T3FREE, THYROIDAB in the last 72 hours. Anemia Panel: No results for input(s): VITAMINB12, FOLATE, FERRITIN, TIBC, IRON, RETICCTPCT in the last 72 hours. Urine analysis:    Component Value Date/Time   COLORURINE YELLOW 04/02/2016 0006   APPEARANCEUR CLEAR 04/02/2016 0006   LABSPEC 1.015 04/02/2016 0006   PHURINE 7.5 04/02/2016 0006   GLUCOSEU NEGATIVE 04/02/2016 0006   HGBUR SMALL (A) 04/02/2016 0006   BILIRUBINUR NEGATIVE 04/02/2016 0006   KETONESUR TRACE (A) 04/02/2016 0006   PROTEINUR NEGATIVE 04/02/2016 0006   UROBILINOGEN 0.2 04/10/2015 2212   NITRITE POSITIVE (A) 04/02/2016 0006   LEUKOCYTESUR NEGATIVE 04/02/2016 0006   Sepsis Labs: @LABRCNTIP (procalcitonin:4,lacticidven:4)  ) Recent Results (from the past 240 hour(s))  Urine culture     Status: None (Preliminary result)   Collection Time: 04/02/16 12:06 AM  Result Value Ref Range Status   Specimen Description URINE, CATHETERIZED  Final   Special Requests NONE  Final   Culture PENDING  Incomplete   Report Status PENDING  Incomplete     Invalid input(s): PROCALCITONIN, LACTICACIDVEN   Radiology Studies: Ct Head Wo Contrast  Result Date: 04/02/2016 CLINICAL DATA:  Acute onset numbness today.  Throat irritation. EXAM: CT HEAD WITHOUT CONTRAST TECHNIQUE: Contiguous axial images were obtained from the base of the skull through the vertex without intravenous contrast. COMPARISON:  CT HEAD July 08, 2015 FINDINGS: BRAIN: Moderate to severe ventriculomegaly with slight disproportionate sulcal effacement at the convexities. Old LEFT thalamus infarct. Patchy to confluent supratentorial white matter hypodensities. No midline shift, mass effect, acute large vascular territory infarct or hemorrhage. No abnormal extra-axial fluid collections. VASCULAR: Minimal calcific atherosclerosis of the carotid siphons. SKULL: No skull fracture. No significant scalp soft tissue swelling. SINUSES/ORBITS: The  mastoid air-cells and included paranasal sinuses are well-aerated.The included ocular globes and orbital contents are non-suspicious. OTHER: None. IMPRESSION: No acute intracranial process. Stable examination including moderate to severe global parenchymal brain volume loss of the could be a component of normal pressure hydrocephalus. Moderate to severe chronic small vessel ischemic disease and old LEFT thalamus lacunar infarct. Electronically Signed   By: Elon Alas M.D.   On: 04/02/2016 01:42   Mr Brain Wo Contrast  Result Date: 04/02/2016 CLINICAL DATA:  Alteration of consciousness. EXAM: MRI HEAD WITHOUT CONTRAST TECHNIQUE: Multiplanar, multiecho pulse sequences of the brain and surrounding structures were obtained without intravenous contrast. COMPARISON:  CT head 04/02/2016.  MRI 02/20/2014. FINDINGS: Brain: Generalized atrophy with prominent subarachnoid space and ventricle size with progression since 2015. Negative for hydrocephalus. Negative for acute infarct. Chronic microvascular ischemic change in the white matter. Chronic infarct left thalamus. Negative for intracranial hemorrhage. Negative for mass or edema. Vascular: Normal arterial flow voids. Skull and upper cervical spine: Negative Sinuses/Orbits: Mucosal edema in the paranasal sinuses. Mucous retention cyst left maxillary sinus. Mild mucosal edema in the mastoid sinus bilaterally. Other: None IMPRESSION: Moderate cerebral atrophy with progression since 2015. No evidence of  hydrocephalus. Moderate chronic microvascular ischemic change.  No acute infarct. Electronically Signed   By: Franchot Gallo M.D.   On: 04/02/2016 07:42        Scheduled Meds: . ALPRAZolam  1 mg Oral TID  . cefTRIAXone (ROCEPHIN) IVPB 1 gram/50 mL D5W  1 g Intravenous Q24H  . clopidogrel  75 mg Oral QHS  . clotrimazole-betamethasone   Topical BID  . cycloSPORINE  1 drop Both Eyes BID  . enoxaparin (LOVENOX) injection  40 mg Subcutaneous Q24H  .  hydroxychloroquine  200 mg Oral Daily  . loratadine  10 mg Oral Daily  . metoprolol tartrate  25 mg Oral BID  . pantoprazole  40 mg Oral Daily  . PARoxetine  40 mg Oral Daily  . polyvinyl alcohol  1 drop Both Eyes Daily  . spironolactone  25 mg Oral Daily   Continuous Infusions: . sodium chloride 75 mL/hr at 04/02/16 0557     LOS: 0 days    Time spent: 35 minutes    Maelynn Moroney A, MD [facility] Pager [phone]  If 7PM-7AM, please contact night-coverage [email or web address] Password [email or web address] 04/02/2016, 1:22 PM

## 2016-04-02 NOTE — Care Management Obs Status (Signed)
[facility] NOTIFICATION   Patient Details  Name: Robin Blackburn MRN: [id number] Date of Birth: July 13, 1945   Medicare Observation Status Notification Given:  Yes    Sherald Barge, RN 04/02/2016, 1:54 PM

## 2016-04-02 NOTE — H&P (Signed)
[facility] H&P    Patient Demographics:    Robin Blackburn, is a 70 y.o. female  MRN: [id number]  DOB - 06-09-1946  Admit Date - 04/01/2016  Referring MD/NP/PA: Dr. Roxanne Mins  Outpatient Primary MD for the patient is Glo Herring., MD  Patient coming from: Home  Chief Complaint  Patient presents with  . Numbness      HPI:    Robin Blackburn  is a 70 y.o. female, With history of CVA, fibromyalgia, hypertension, rheumatoid arthritis who came to the hospital with complaints of numbness of fingertips, smelling of perfumes, spaced out feeling for past 1 week. Patient says that she ran out of Xanax a week ago, and stopped taking Xanax. She has been taking Xanax 1 mg 3 times a day for more than 20 years. Also had symptoms of nausea, blurry vision, dizziness, decreased appetite. Patient also complains of dysuria, no chest pain or shortness of breath. She also has a rash on her neck, which has been present before stopping Xanax.  In the ED CT scan of the head was negative for acute abnormality. UA was found to be abnormal. Patient started on ceftriaxone.     Review of systems:      A full 10 point Review of Systems was done, except as stated above, all other Review of Systems were negative.   With Past History of the following :    Past Medical History:  Diagnosis Date  . Acid reflux   . Anxiety   . 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 Depression   . Fibromyalgia   . Hypertension   . Neuropathy ([place])   . Rheumatoid arthritis ([place])   . Stroke [facility]) 02/17/14      Past Surgical History:  Procedure Laterality Date  . ABDOMINAL HYSTERECTOMY    . ANKLE RECONSTRUCTION    . APPENDECTOMY    . BACK SURGERY    . CHOLECYSTECTOMY    . KNEE SURGERY        Social History:      Social History  Substance Use Topics  . Smoking status: Never Smoker  . Smokeless tobacco: Never Used  .  Alcohol use No       Family History :     Family History  Problem Relation Age of Onset  . Hypertension Father   . Transient ischemic attack Father   . Hypertension Brother   . CVA Maternal Grandfather   . Leukemia Brother       Home Medications:   Prior to Admission medications   Medication Sig Start Date End Date Taking? Authorizing Provider  acetaminophen (TYLENOL) 325 MG tablet Take 650 mg by mouth daily. May give a dose of Hydrocodone/APAP 7.5/5 prn along with this if needed   Yes Historical Provider, MD  ALPRAZolam Duanne Moron) 1 MG tablet Take one tablet three times daily. Monitor for frequent complaints, frequent movements SOB document behavior 12/19/15  Yes Estill Dooms, MD  Cholecalciferol 5000 units TABS Will take 5,000 unit along with 2,000 unit daily  for a 7,000 unit daily   Yes Historical Provider, MD  HYDROcodone-acetaminophen (NORCO) 7.5-325 MG tablet Take 1 tablet by mouth every 6 (six) hours as needed. 03/12/16  Yes Historical Provider, MD  methocarbamol (ROBAXIN) 750 MG tablet Take 375-750 mg by mouth every 6 (six) hours as needed for muscle spasms.    Yes Historical Provider, MD  clopidogrel (PLAVIX) 75 MG tablet Take 75 mg by mouth at bedtime. 05/03/14   Historical Provider, MD  desloratadine (CLARINEX) 5 MG tablet Take 5 mg by mouth daily.    Historical Provider, MD  dicyclomine (BENTYL) 20 MG tablet Take 20 mg by mouth every 6 (six) hours as needed. Abdominal cramps 01/31/14   Historical Provider, MD  docusate sodium (COLACE) 100 MG capsule Take 100 mg by mouth 2 (two) times daily as needed for mild constipation.     Historical Provider, MD  hydroxychloroquine (PLAQUENIL) 200 MG tablet Take 200 mg by mouth daily.     Historical Provider, MD  lansoprazole (PREVACID) 30 MG capsule Take 30 mg by mouth 2 (two) times daily. 02/17/14   Historical Provider, MD  METHYL SALICYLATE-LIDO-MENTHOL EX Arthritis hot pain relief apply thin layer 15-10 % three times a to left leg as  needed    Historical Provider, MD  metoprolol tartrate (LOPRESSOR) 25 MG tablet Take 1 tablet (25 mg total) by mouth 2 (two) times daily. 09/28/15   Hendricks Limes, MD  ondansetron (ZOFRAN) 4 MG tablet Take 4 mg by mouth every 6 (six) hours as needed. Give with plaquenil daily at 10 am. Give 4 mg as needed for N / V    Historical Provider, MD  PARoxetine (PAXIL) 40 MG tablet Take 1 tablet by mouth daily. 12/14/15   Historical Provider, MD  Propylene Glycol 0.6 % SOLN Place 1 drop into both eyes daily. At noon.    Historical Provider, MD  RESTASIS 0.05 % ophthalmic emulsion Place 1 drop into both eyes 2 (two) times daily. 01/31/14   Historical Provider, MD  simethicone (MYLICON) 0000000 MG chewable tablet Chew 125 mg by mouth at bedtime. And as needed.    Historical Provider, MD  sodium chloride (OCEAN) 0.65 % SOLN nasal spray Place 1 spray into both nostrils as needed for congestion. 02/22/14   Rexene Alberts, MD  spironolactone (ALDACTONE) 25 MG tablet Take 25 mg by mouth daily.    Historical Provider, MD  zolpidem (AMBIEN CR) 12.5 MG CR tablet Take 1 tablet (12.5 mg total) by mouth at bedtime as needed for sleep. 10/18/15   Tiffany Lynelle Doctor, DO     Allergies:     Allergies  Allergen Reactions  . Demerol [Meperidine] Anaphylaxis  . Hydromorphone Anaphylaxis  . Penicillins Anaphylaxis    Has patient had a PCN reaction causing immediate rash, facial/tongue/throat swelling, SOB or lightheadedness with hypotension: Yes Has patient had a PCN reaction causing severe rash involving mucus membranes or skin necrosis: No Has patient had a PCN reaction that required hospitalization No Has patient had a PCN reaction occurring within the last 10 years: No If all of the above answers are "NO", then may proceed with Cephalosporin use.   . Sulfa Antibiotics Nausea And Vomiting  . Bisphosphonates     GI intolerance  . Fentanyl Other (See Comments)    "felt like I had demons in my head"  . Iohexol   . Percocet  [Oxycodone-Acetaminophen] Swelling    Mouth swelling.  . Shellfish Allergy     Glucosamine not an option  .  Terramycin [Oxytetracycline] Nausea And Vomiting  . Ciprofloxacin Nausea Only and Rash  . Levaquin [Levofloxacin In D5w] Nausea And Vomiting and Rash  . Morphine And Related Rash     Physical Exam:   Vitals  Blood pressure 140/72, pulse 64, temperature 98.7 F (37.1 C), temperature source Oral, resp. rate 20, height 5\' 6"  (1.676 m), weight 84.8 kg (187 lb), SpO2 100 %.  1.  General: Appears in no acute distress  2. Psychiatric:  Intact judgement and  insight, awake alert, oriented x 3.  3. Neurologic: No focal neurological deficits, all cranial nerves intact.Strength 5/5 all 4 extremities, sensation intact all 4 extremities, plantars down going.  4. Eyes :  anicteric sclerae, moist conjunctivae with no lid lag. PERRLA.  5. ENMT:  Oropharynx clear with moist mucous membranes and good dentition  6. Neck:  supple, no cervical lymphadenopathy appriciated, No thyromegaly  7. Respiratory : Normal respiratory effort, good air movement bilaterally,clear to  auscultation bilaterally  8. Cardiovascular : RRR, no gallops, rubs or murmurs, no leg edema  9. Gastrointestinal:  Positive bowel sounds, abdomen soft, non-tender to palpation,no hepatosplenomegaly, no rigidity or guarding       10. Skin:  Erythematous rash noted on neck  11.Musculoskeletal:  Good muscle tone,  joints appear normal , no effusions,  normal range of motion    Data Review:    CBC  Recent Labs Lab 04/02/16 0014  WBC 17.8*  HGB 13.8  HCT 41.1  PLT 526*  MCV 91.3  MCH 30.7  MCHC 33.6  RDW 13.3  LYMPHSABS 3.2  MONOABS 1.7*  EOSABS 0.0  BASOSABS 0.0   ------------------------------------------------------------------------------------------------------------------  Chemistries   Recent Labs Lab 04/02/16 0014  NA 130*  K 3.3*  CL 91*  CO2 28  GLUCOSE 134*  BUN 6  CREATININE  0.62  CALCIUM 8.7*  AST 18  ALT 12*  ALKPHOS 76  BILITOT 0.7   ------------------------------------------------------------------------------------------------------------------  ------------------------------------------------------------------------------------------------------------------ GFR: Estimated Creatinine Clearance: 71.8 mL/min (by C-G formula based on SCr of 0.62 mg/dL). Liver Function Tests:  Recent Labs Lab 04/02/16 0014  AST 18  ALT 12*  ALKPHOS 76  BILITOT 0.7  PROT 7.0  ALBUMIN 3.5   No results for input(s): LIPASE, AMYLASE in the last 168 hours. No results for input(s): AMMONIA in the last 168 hours. Coagulation Profile: No results for input(s): INR, PROTIME in the last 168 hours. Cardiac Enzymes:  Recent Labs Lab 04/02/16 0014  TROPONINI <0.03    --------------------------------------------------------------------------------------------------------------- Urine analysis:    Component Value Date/Time   COLORURINE YELLOW 04/02/2016 0006   APPEARANCEUR CLEAR 04/02/2016 0006   LABSPEC 1.015 04/02/2016 0006   PHURINE 7.5 04/02/2016 0006   GLUCOSEU NEGATIVE 04/02/2016 0006   HGBUR SMALL (A) 04/02/2016 0006   BILIRUBINUR NEGATIVE 04/02/2016 0006   KETONESUR TRACE (A) 04/02/2016 0006   PROTEINUR NEGATIVE 04/02/2016 0006   UROBILINOGEN 0.2 04/10/2015 2212   NITRITE POSITIVE (A) 04/02/2016 0006   LEUKOCYTESUR NEGATIVE 04/02/2016 0006      Imaging Results:    Ct Head Wo Contrast  Result Date: 04/02/2016 CLINICAL DATA:  Acute onset numbness today.  Throat irritation. EXAM: CT HEAD WITHOUT CONTRAST TECHNIQUE: Contiguous axial images were obtained from the base of the skull through the vertex without intravenous contrast. COMPARISON:  CT HEAD July 08, 2015 FINDINGS: BRAIN: Moderate to severe ventriculomegaly with slight disproportionate sulcal effacement at the convexities. Old LEFT thalamus infarct. Patchy to confluent supratentorial white  matter hypodensities. No midline shift, mass effect, acute  large vascular territory infarct or hemorrhage. No abnormal extra-axial fluid collections. VASCULAR: Minimal calcific atherosclerosis of the carotid siphons. SKULL: No skull fracture. No significant scalp soft tissue swelling. SINUSES/ORBITS: The mastoid air-cells and included paranasal sinuses are well-aerated.The included ocular globes and orbital contents are non-suspicious. OTHER: None. IMPRESSION: No acute intracranial process. Stable examination including moderate to severe global parenchymal brain volume loss of the could be a component of normal pressure hydrocephalus. Moderate to severe chronic small vessel ischemic disease and old LEFT thalamus lacunar infarct. Electronically Signed   By: Elon Alas M.D.   On: 04/02/2016 01:42    My personal review of EKG: Rhythm NSR   Assessment & Plan:    Active Problems:   Urinary tract infection without hematuria   UTI (urinary tract infection)   1. Benzodiazepine withdrawal- patient having altered consciousness, since stopping Xanax week ago. CT head negative for acute abnormality, will obtain MRI brain. Restart Xanax 1 mg 3 times a day. 2. UTI- patient has abnormal UA, obtain urine culture. Start ceftriaxone 1 g IV daily 3. Rheumatoid arthritis- continue hydroxychloroquine. 4. Hypertension- blood pressure stable, continue metoprolol 5. History of CVA- continue Plavix 6. Hypokalemia-replace potassium and check BMP in a.m. 7. Hyponatremia- sodium was 130, check serum osmolarity. Repeat BMP in a.m.   DVT Prophylaxis-   Lovenox   AM Labs Ordered, also please review Full Orders  Family Communication: No family at bedside  Code Status:  Full code  Admission status: Observation    Time spent in minutes : 60 minutes   Karem Tomaso S M.D on 04/02/2016 at 4:46 AM  Between 7am to 7pm - Pager - [phone]. After 7pm go to [email or web address] - password [email or web address]  [facility] -  Office  [phone]

## 2016-04-02 NOTE — ED Notes (Signed)
Pt states she is unable to give a urine sample at this time.

## 2016-04-03 DIAGNOSIS — E871 Hypo-osmolality and hyponatremia: Secondary | ICD-10-CM

## 2016-04-03 DIAGNOSIS — F13232 Sedative, hypnotic or anxiolytic dependence with withdrawal with perceptual disturbance: Secondary | ICD-10-CM | POA: Diagnosis not present

## 2016-04-03 DIAGNOSIS — E876 Hypokalemia: Secondary | ICD-10-CM | POA: Diagnosis not present

## 2016-04-03 DIAGNOSIS — N390 Urinary tract infection, site not specified: Secondary | ICD-10-CM | POA: Diagnosis not present

## 2016-04-03 LAB — CBC
HEMATOCRIT: 38.3 % (ref 36.0–46.0)
Hemoglobin: 12.4 g/dL (ref 12.0–15.0)
MCH: 30.7 pg (ref 26.0–34.0)
MCHC: 32.4 g/dL (ref 30.0–36.0)
MCV: 94.8 fL (ref 78.0–100.0)
PLATELETS: 384 10*3/uL (ref 150–400)
RBC: 4.04 MIL/uL (ref 3.87–5.11)
RDW: 13.5 % (ref 11.5–15.5)
WBC: 10.3 10*3/uL (ref 4.0–10.5)

## 2016-04-03 LAB — BASIC METABOLIC PANEL
Anion gap: 7 (ref 5–15)
BUN: 6 mg/dL (ref 6–20)
CHLORIDE: 100 mmol/L — AB (ref 101–111)
CO2: 24 mmol/L (ref 22–32)
Calcium: 8.3 mg/dL — ABNORMAL LOW (ref 8.9–10.3)
Creatinine, Ser: 0.62 mg/dL (ref 0.44–1.00)
Glucose, Bld: 108 mg/dL — ABNORMAL HIGH (ref 65–99)
POTASSIUM: 4.3 mmol/L (ref 3.5–5.1)
SODIUM: 131 mmol/L — AB (ref 135–145)

## 2016-04-03 LAB — URINE CULTURE: Culture: >=100000 — AB

## 2016-04-03 MED ORDER — NYSTATIN 100000 UNIT/GM EX CREA
TOPICAL_CREAM | Freq: Three times a day (TID) | CUTANEOUS | Status: DC
  Administered 2016-04-03 (×2): via TOPICAL
  Filled 2016-04-03: qty 15

## 2016-04-03 MED ORDER — SIMETHICONE 40 MG/0.6ML PO SUSP: 40.0000 mg | Freq: Four times a day (QID) | ORAL | Status: DC | PRN

## 2016-04-03 MED ORDER — NYSTATIN 100000 UNIT/GM EX POWD
Freq: Three times a day (TID) | CUTANEOUS | Status: DC
  Administered 2016-04-03 – 2016-04-04 (×2): via TOPICAL
  Filled 2016-04-03: qty 15

## 2016-04-03 NOTE — Evaluation (Signed)
Physical Therapy Evaluation Patient Details Name: Robin Blackburn MRN: [id number] DOB: [birth year] Today's Date: 04/03/2016   History of Present Illness  Robin Blackburn  is a 70 y.o. female, With history of CVA, fibromyalgia, hypertension, rheumatoid arthritis who came to the hospital with complaints of numbness of fingertips, smelling of perfumes, spaced out feeling for past 1 week. Patient says that she ran out of Xanax a week ago, and stopped taking Xanax. She has been taking Xanax 1 mg 3 times a day for more than 20 years. Also had symptoms of nausea, blurry vision, dizziness, decreased appetite.  Clinical Impression   Patient presents supine in completely flat bed, pleasant and willing to participate with skilled PT services, and family friend and caregiver present throughout session. Patient able to complete sit to supine/supine to sit with min-mod assist, and rolling with min guard; however, in sitting patient did become significantly dizzy, likely due to orthostatic hypotension given her history of laying flat in bed for the past 3 months despite family/caregiver encouragement to remain active. Poor trunk strength/core activation noted at EOB with min guard-min(A) needed to maintain upright sitting posture. Returned patient to supine on bed pan with mod assist and then spoke to family/caregiver privately, both of whom stated that patient's home is not handicapped accessible and that they are having a very difficult time taking care of her at home due to her refusal to get out of supine position in her bed. Due to patient performance today and report of family/caregiver, strongly recommend SNF at time of DC. Patient left on bed pan with caregiver present, all questions/concerns addressed and needs met.   Follow Up Recommendations SNF    Equipment Recommendations  None recommended by PT    Recommendations for Other Services Speech consult;OT consult     Precautions / Restrictions  Precautions Precautions: Fall Restrictions Weight Bearing Restrictions: No      Mobility  Bed Mobility Overal bed mobility: Needs Assistance Bed Mobility: Rolling;Supine to Sit;Sit to Supine Rolling: Min guard   Supine to sit: Min assist Sit to supine: Mod assist   General bed mobility comments: dizziness while sitting at EOB, trunk/core weakness limiting stability while statically sitting   Transfers                 General transfer comment: DNT transfers due to instability/severe trunk weakness in sitting   Ambulation/Gait                Stairs            Wheelchair Mobility    Modified Rankin (Stroke Patients Only)       Balance Overall balance assessment: Needs assistance Sitting-balance support: Bilateral upper extremity supported Sitting balance-Leahy Scale: Poor Sitting balance - Comments: poor trunk control/poor core and trunk strength                                      Pertinent Vitals/Pain Pain Assessment: No/denies pain    Home Living Family/patient expects to be discharged to:: Private residence Living Arrangements: Alone Available Help at Discharge: Personal care attendant Type of Home: House Home Access: Stairs to enter Entrance Stairs-Rails: Right Entrance Stairs-Number of Steps: 4 steps with HR on the Right.  Home Layout: One level Home Equipment: [place] - 4 wheels;Wheelchair - Regulatory affairs officer - single point;Bedside commode Additional Comments: caregiver reports that patient's home is not handicap equipped or accessible  Prior Function           Comments: caregiver reports patient has been laying flat in bed for the past 3-4 months, high levels of assist needed      Hand Dominance        Extremity/Trunk Assessment   Upper Extremity Assessment: Defer to OT evaluation           Lower Extremity Assessment: Generalized weakness      Cervical / Trunk Assessment: Kyphotic   Communication   Communication: No difficulties  Cognition Arousal/Alertness: Awake/alert Behavior During Therapy: WFL for tasks assessed/performed Overall Cognitive Status: Within Functional Limits for tasks assessed                      General Comments      Exercises     Assessment/Plan    PT Assessment Patient needs continued PT services  PT Problem List Decreased strength;Decreased mobility;Decreased safety awareness;Decreased coordination;Decreased activity tolerance;Decreased balance          PT Treatment Interventions Therapeutic activities;DME instruction;Gait training;Therapeutic exercise;Patient/family education;Stair training;Balance training;Functional mobility training;Neuromuscular re-education    PT Goals (Current goals can be found in the Care Plan section)  Acute Rehab PT Goals Patient Stated Goal: improved function/participation in tasks  PT Goal Formulation: With family Time For Goal Achievement: 04/17/16 Potential to Achieve Goals: Fair    Frequency Min 3X/week   Barriers to discharge Inaccessible home environment      Co-evaluation               End of Session   Activity Tolerance: Patient tolerated treatment well;Patient limited by fatigue;Treatment limited secondary to medical complications (Comment);Other (comment) (limited by dizziness related to orthostatic/postural hypotension ) Patient left: in bed;with call bell/phone within reach;with family/visitor present      Functional Assessment Tool Used: Based on skilled clinical assessment of functional mobiltiy, strength, seated trunk control/balance, orthostatic symptoms limiting mobility  Functional Limitation: Mobility: Walking and moving around Mobility: Walking and Moving Around Current Status (G2542): At least 80 percent but less than 100 percent impaired, limited or restricted Mobility: Walking and Moving Around Goal Status [phone]): At least 60 percent but less than 80  percent impaired, limited or restricted    Time: 0945-1010 PT Time Calculation (min) (ACUTE ONLY): 25 min   Charges:   PT Evaluation $PT Eval Moderate Complexity: 1 Procedure     PT G Codes:   PT G-Codes **NOT FOR INPATIENT CLASS** Functional Assessment Tool Used: Based on skilled clinical assessment of functional mobiltiy, strength, seated trunk control/balance, orthostatic symptoms limiting mobility  Functional Limitation: Mobility: Walking and moving around Mobility: Walking and Moving Around Current Status (J6283): At least 80 percent but less than 100 percent impaired, limited or restricted Mobility: Walking and Moving Around Goal Status [phone]): At least 60 percent but less than 80 percent impaired, limited or restricted    Deniece Ree PT, DPT [phone]

## 2016-04-03 NOTE — NC FL2 (Signed)
[place] LEVEL OF CARE SCREENING TOOL     IDENTIFICATION  Patient Name: Robin Blackburn Birthdate: 1945/10/10 Sex: female Admission Date (Current Location): 04/01/2016  [facility] and [place] Number:  Herbalist and Address:  [facility] [address], [place]      Provider Number: [id number]  Attending Physician Name and Address:  Verlee Monte, MD  Relative Name and Phone Number:       Current Level of Care: Hospital Recommended Level of Care: [place] Prior Approval Number:    Date Approved/Denied:   PASRR Number: [id number]  Discharge Plan: SNF    Current Diagnoses: Patient Active Problem List   Diagnosis Date Noted  . Urinary tract infection without hematuria 04/02/2016  . UTI (urinary tract infection) 04/02/2016  . Benzodiazepine withdrawal with perceptual disturbance ([place]) 04/02/2016  . Acute pain of left knee 12/18/2015  . Osteoporosis 11/01/2015  . Vitamin D deficiency 10/11/2015  . History of fracture of left ankle 09/28/2015  . Chronic pain in left foot 09/28/2015  . Fibromyalgia 09/28/2015  . Rash and nonspecific skin eruption 07/17/2015  . Cerumen impaction 05/30/2015  . Edema 04/16/2015  . Cellulitis 04/16/2015  . Mouth pain 04/08/2015  . Pain in joint, lower leg 04/08/2015  . GERD (gastroesophageal reflux disease) 04/05/2015  . Occult blood positive stool 01/17/2015  . Diarrhea 01/05/2015  . Chronic pain syndrome 12/02/2014  . Pain in joint, upper arm 10/29/2014  . Nasal congestion 10/29/2014  . Allergic rhinitis 08/20/2014  . Conjunctivitis 08/08/2014  . Dysuria 06/27/2014  . Back pain, lumbosacral 06/23/2014  . Sinusitis, chronic 06/17/2014  . Fall 05/24/2014  . H/O: CVA (cerebrovascular accident) 05/24/2014  . HLD (hyperlipidemia) 05/24/2014  . Physical deconditioning 05/24/2014  . Fracture of fifth metatarsal bone of right foot 03/06/2014  . Right sided weakness 02/26/2014   . Leukocytosis 02/20/2014  . Rheumatoid arthritis ([place]) 02/20/2014  . CVA (cerebral infarction) 02/19/2014  . Right ankle sprain 02/19/2014  . Contusion of right foot 02/19/2014  . Essential hypertension 02/19/2014  . Neuropathy ([place]) 02/19/2014  . Anxiety 02/19/2014  . Hyponatremia 02/19/2014  . Hypokalemia 02/19/2014    Orientation RESPIRATION BLADDER Height & Weight     Self, Time, Situation, Place  Normal Continent Weight: 204 lb 12.8 oz (92.9 kg) Height:  5\' 6"  (167.6 cm)  BEHAVIORAL SYMPTOMS/MOOD NEUROLOGICAL BOWEL NUTRITION STATUS      Continent Diet (regular)  AMBULATORY STATUS COMMUNICATION OF NEEDS Skin   Extensive Assist Verbally Normal                       Personal Care Assistance Level of Assistance  Bathing, Feeding, Dressing Bathing Assistance: Limited assistance Feeding assistance: Limited assistance Dressing Assistance: Limited assistance     Functional Limitations Info  Sight, Hearing, Speech Sight Info: Adequate Hearing Info: Adequate Speech Info: Adequate    SPECIAL CARE FACTORS FREQUENCY  PT (By licensed PT), OT (By licensed OT)     PT Frequency: 5 OT Frequency: 5            Contractures Contractures Info: Not present    Additional Factors Info  Code Status, Allergies Code Status Info: Full Code Allergies Info: Demerol Meperidine, Hydromorphone, Penicillins, Sulfa Antibiotics, Bisphosphonates, Fentanyl, Iohexol, Percocet Oxycodone-acetaminophen, Shellfish Allergy, Terramycin Oxytetracycline, Ciprofloxacin, Levaquin Levofloxacin In D5w, Morphine And Related           Current Medications (04/03/2016):  This is the current hospital active  medication list Current Facility-Administered Medications  Medication Dose Route Frequency Provider Last Rate Last Dose  . 0.9 %  sodium chloride infusion   Intravenous Continuous Oswald Hillock, MD 75 mL/hr at 04/02/16 2135    . ALPRAZolam (XANAX) tablet 1 mg  1 mg Oral TID Oswald Hillock, MD   1  mg at 04/03/16 1100  . cefTRIAXone (ROCEPHIN) 1 g in dextrose 5 % 50 mL IVPB  1 g Intravenous Q24H Verlee Monte, MD   1 g at 04/02/16 1550  . clopidogrel (PLAVIX) tablet 75 mg  75 mg Oral QHS Oswald Hillock, MD   75 mg at 04/02/16 2111  . cycloSPORINE (RESTASIS) 0.05 % ophthalmic emulsion 1 drop  1 drop Both Eyes BID Oswald Hillock, MD   1 drop at 04/03/16 1101  . enoxaparin (LOVENOX) injection 40 mg  40 mg Subcutaneous Q24H Oswald Hillock, MD   40 mg at 04/03/16 0540  . HYDROcodone-acetaminophen (NORCO) 7.5-325 MG per tablet 1 tablet  1 tablet Oral Q6H PRN Oswald Hillock, MD   1 tablet at 04/03/16 1101  . hydroxychloroquine (PLAQUENIL) tablet 200 mg  200 mg Oral Daily Oswald Hillock, MD   200 mg at 04/03/16 1100  . loratadine (CLARITIN) tablet 10 mg  10 mg Oral Daily Oswald Hillock, MD   10 mg at 04/03/16 1100  . metoprolol tartrate (LOPRESSOR) tablet 25 mg  25 mg Oral BID Oswald Hillock, MD   25 mg at 04/03/16 1100  . nystatin (MYCOSTATIN/NYSTOP) topical powder   Topical TID Verlee Monte, MD       Or  . nystatin cream (MYCOSTATIN)   Topical TID Verlee Monte, MD      . ondansetron (ZOFRAN) tablet 4 mg  4 mg Oral Q6H PRN Oswald Hillock, MD       Or  . ondansetron (ZOFRAN) injection 4 mg  4 mg Intravenous Q6H PRN Oswald Hillock, MD   4 mg at 04/02/16 1132  . pantoprazole (PROTONIX) EC tablet 40 mg  40 mg Oral Daily Oswald Hillock, MD   40 mg at 04/03/16 1100  . PARoxetine (PAXIL) tablet 40 mg  40 mg Oral Daily Oswald Hillock, MD   40 mg at 04/03/16 1100  . polyvinyl alcohol (LIQUIFILM TEARS) 1.4 % ophthalmic solution 1 drop  1 drop Both Eyes Daily Oswald Hillock, MD   1 drop at 04/03/16 1101  . potassium chloride SA (K-DUR,KLOR-CON) CR tablet 40 mEq  40 mEq Oral Q8H Verlee Monte, MD   40 mEq at 04/03/16 0540  . simethicone (MYLICON) 40 [id number] suspension 40 mg  40 mg Oral QID PRN Verlee Monte, MD      . spironolactone (ALDACTONE) tablet 25 mg  25 mg Oral Daily Oswald Hillock, MD   25 mg at 04/03/16 1100  . zolpidem  (AMBIEN) tablet 10 mg  10 mg Oral QHS PRN Oswald Hillock, MD         Discharge Medications: Please see discharge summary for a list of discharge medications.  Relevant Imaging Results:  Relevant Lab Results:   Additional Information SSN: [id number]  Lilly Cove, [place]

## 2016-04-03 NOTE — Clinical Social Work Placement (Addendum)
Patient has been declined from [facility] only due to availability and no current female beds.   CLINICAL SOCIAL WORK PLACEMENT  NOTE  Date:  04/03/2016  Patient Details  Name: WILBERTA AVE MRN: [id number] Date of Birth: 04/26/1946  Clinical Social Work is seeking post-discharge placement for this patient at the [place] level of care (*CSW will initial, date and re-position this form in  chart as items are completed):  Yes   Patient/family provided with [facility] Work Department's list of facilities offering this level of care within the geographic area requested by the patient (or if unable, by the patient's family).  Yes   Patient/family informed of their freedom to choose among providers that offer the needed level of care, that participate in Medicare, Medicaid or managed care program needed by the patient, have an available bed and are willing to accept the patient.  Yes   Patient/family informed of [facility]'s ownership interest in [facility] and [facility], as well as of the fact that they are under no obligation to receive care at these facilities.  PASRR submitted to EDS on       PASRR number received on       Existing PASRR number confirmed on 04/03/16     FL2 transmitted to all facilities in geographic area requested by pt/family on 04/03/16     FL2 transmitted to all facilities within larger geographic area on       Patient informed that his/her managed care company has contracts with or will negotiate with certain facilities, including the following:            Patient/family informed of bed offers received.  Patient chooses bed at       Physician recommends and patient chooses bed at      Patient to be transferred to   on  .  Patient to be transferred to facility by       Patient family notified on   of transfer.  Name of family member notified:        PHYSICIAN Please sign FL2     Additional Comment:     _______________________________________________ Lilly C[place], LCSW 04/03/2016, 12:19 PM

## 2016-04-03 NOTE — Clinical Social Work Note (Signed)
Clinical Social Work Assessment  Patient Details  Name: Robin Blackburn MRN: [id number] Date of Birth: [birth year]  Date of referral:  04/03/16               Reason for consult:  Facility Placement, Discharge Planning                Permission sought to share information with:  Case Manager, Customer service manager, Family Supports Permission granted to share information::  Yes, Verbal Permission Granted  Name::        Agency::  [facility]  Relationship::  brother/other family  Contact Information:     Housing/Transportation Living arrangements for the past 2 months:  [place] of Information:  Patient, Medical Team, Case Manager Patient Interpreter Needed:  None Criminal Activity/Legal Involvement Pertinent to Current Situation/Hospitalization:  No - Comment as needed Significant Relationships:  Other Family Members Lives with:  Self Do you feel safe going back to the place where you live?  No Need for family participation in patient care:  No (Coment)  Care giving concerns:  Patient reports she lives alone and has family involved in case and a sitter in the home to assist with needs.  Reports at baseline she can walk, but recently in the last two weeks she cannot get along or walk.  Reports she is interested in short term rehab at [facility] and no other facilities at this time.   Social Worker assessment / plan: LCSW received consult for placement.  Discussed with patient role and services while in hospital along with recommendation for SNF. Patient agreeable for only ST rehab and reports she would go back to [facility] as she like the progress she made.  LCSW completed SNF work up and will follow up with referrals. PLAN:  SNF  Employment status:  Retired Nurse, adult PT Recommendations:  [place] / Referral to community resources:  [place]  Patient/Family's Response to care:   Agreeable to plan  Patient/Family's Understanding of and Emotional Response to Diagnosis, Current Treatment, and Prognosis:  Patient verbalizes understanding of illness and agrees to recommendation for SNF.  Emotional Assessment Appearance:  Appears stated age Attitude/Demeanor/Rapport:  Other (cooperative, open, and pleasant) Affect (typically observed):  Accepting, Adaptable, Hopeful Orientation:  Oriented to Self, Oriented to Place, Oriented to  Time, Oriented to Situation Alcohol / Substance use:  Not Applicable Psych involvement (Current and /or in the community):  No (Comment)  Discharge Needs  Concerns to be addressed:  No discharge needs identified Readmission within the last 30 days:  No Current discharge risk:  None Barriers to Discharge:  No Barriers Identified, Continued Medical Work up   Lilly C[place], LCSW 04/03/2016, 12:15 PM

## 2016-04-03 NOTE — Progress Notes (Signed)
PROGRESS NOTE  Robin Blackburn  [id number] DOB: September 04, 1945 DOA: 04/01/2016 PCP: Glo Herring., MD Outpatient Specialists:  Subjective: Reported that she couldn't get up, seen by PT and recommended SNF.  Brief Narrative:  Robin Blackburn  is a 70 y.o. female, With history of CVA, fibromyalgia, hypertension, rheumatoid arthritis who came to the hospital with complaints of numbness of fingertips, smelling of perfumes, spaced out feeling for past 1 week. Patient says that she ran out of Xanax a week ago, and stopped taking Xanax. She has been taking Xanax 1 mg 3 times a day for more than 20 years. Also had symptoms of nausea, blurry vision, dizziness, decreased appetite. Patient also complains of dysuria, no chest pain or shortness of breath. She also has a rash on her neck, which has been present before stopping Xanax.  Assessment & Plan:   Active Problems:   Hyponatremia   Hypokalemia   Urinary tract infection without hematuria   UTI (urinary tract infection)   Benzodiazepine withdrawal with perceptual disturbance (HCC)   Benzodiazepine withdrawal- patient having altered consciousness, since stopping Xanax a month ago. CT head negative for acute abnormality, will obtain MRI brain. Restart Xanax 1 mg 3 times a day.  UTI- patient has abnormal UA, obtain urine culture. Start ceftriaxone 1 g IV daily  Rheumatoid arthritis- continue hydroxychloroquine.  Hypertension- blood pressure stable, continue metoprolol  History of CVA- continue Plavix  Hypokalemia, Potassium 3.2 on admission, improved to 4.3 after oral supplements.  Hyponatremia-  she has chronic hyponatremia, presented with sodium of 130, the same day.  Altered sense of smell -Patient reported that she smells things that are not in the room, denies any history of seizure. MRI normal. -Reported history of sinus problems.   DVT prophylaxis: Lovenox Code Status: Full Code Family Communication:  Disposition Plan: Likely  for discharge in a.m. Diet: Diet regular Room service appropriate? Yes; Fluid consistency: Thin  Consultants:   None  Procedures:   None  Antimicrobials:   None   Objective: Vitals:   04/02/16 1218 04/02/16 2111 04/02/16 2200 04/03/16 0543  BP: (!) 122/59 (!) 141/71 (!) 126/55 (!) 151/90  Pulse: 63 70 71 67  Resp: 18  18 16   Temp: 99.1 F (37.3 C)  98.6 F (37 C) 98.6 F (37 C)  TempSrc: Oral  Oral Oral  SpO2: 99%  100% 99%  Weight: 92.9 kg (204 lb 12.8 oz)     Height: 5\' 6"  (1.676 m)       Intake/Output Summary (Last 24 hours) at 04/03/16 1125 Last data filed at 04/03/16 0600  Gross per 24 hour  Intake             1795 ml  Output              200 ml  Net             1595 ml   Filed Weights   04/01/16 2210 04/02/16 1218  Weight: 84.8 kg (187 lb) 92.9 kg (204 lb 12.8 oz)    Examination: General exam: Appears calm and comfortable  Respiratory system: Clear to auscultation. Respiratory effort normal. Cardiovascular system: S1 & S2 heard, RRR. No JVD, murmurs, rubs, gallops or clicks. No pedal edema. Gastrointestinal system: Abdomen is nondistended, soft and nontender. No organomegaly or masses felt. Normal bowel sounds heard. Central nervous system: Alert and oriented. No focal neurological deficits. Extremities: Symmetric 5 x 5 power. Skin: No rashes, lesions or ulcers Psychiatry: Judgement and insight appear normal. Mood &  affect appropriate.   Data Reviewed: I have personally reviewed following labs and imaging studies  CBC:  Recent Labs Lab 04/02/16 0014 04/02/16 0638 04/03/16 0558  WBC 17.8* 17.7* 10.3  NEUTROABS 12.8*  --   --   HGB 13.8 13.7 12.4  HCT 41.1 40.6 38.3  MCV 91.3 92.1 94.8  PLT 526* 513* 0000000   Basic Metabolic Panel:  Recent Labs Lab 04/02/16 0014 04/02/16 0638 04/03/16 0558  NA 130* 130* 131*  K 3.3* 3.2* 4.3  CL 91* 93* 100*  CO2 28 29 24   GLUCOSE 134* 129* 108*  BUN 6 6 6   CREATININE 0.62 0.64 0.62  CALCIUM 8.7*  8.6* 8.3*   GFR: Estimated Creatinine Clearance: 75.1 mL/min (by C-G formula based on SCr of 0.62 mg/dL). Liver Function Tests:  Recent Labs Lab 04/02/16 0014 04/02/16 0638  AST 18 17  ALT 12* 11*  ALKPHOS 76 73  BILITOT 0.7 0.7  PROT 7.0 6.7  ALBUMIN 3.5 3.3*   No results for input(s): LIPASE, AMYLASE in the last 168 hours. No results for input(s): AMMONIA in the last 168 hours. Coagulation Profile: No results for input(s): INR, PROTIME in the last 168 hours. Cardiac Enzymes:  Recent Labs Lab 04/02/16 0014  TROPONINI <0.03   BNP (last 3 results) No results for input(s): PROBNP in the last 8760 hours. HbA1C: No results for input(s): HGBA1C in the last 72 hours. CBG: No results for input(s): GLUCAP in the last 168 hours. Lipid Profile: No results for input(s): CHOL, HDL, LDLCALC, TRIG, CHOLHDL, LDLDIRECT in the last 72 hours. Thyroid Function Tests: No results for input(s): TSH, T4TOTAL, FREET4, T3FREE, THYROIDAB in the last 72 hours. Anemia Panel: No results for input(s): VITAMINB12, FOLATE, FERRITIN, TIBC, IRON, RETICCTPCT in the last 72 hours. Urine analysis:    Component Value Date/Time   COLORURINE YELLOW 04/02/2016 0006   APPEARANCEUR CLEAR 04/02/2016 0006   LABSPEC 1.015 04/02/2016 0006   PHURINE 7.5 04/02/2016 0006   GLUCOSEU NEGATIVE 04/02/2016 0006   HGBUR SMALL (A) 04/02/2016 0006   BILIRUBINUR NEGATIVE 04/02/2016 0006   KETONESUR TRACE (A) 04/02/2016 0006   PROTEINUR NEGATIVE 04/02/2016 0006   UROBILINOGEN 0.2 04/10/2015 2212   NITRITE POSITIVE (A) 04/02/2016 0006   LEUKOCYTESUR NEGATIVE 04/02/2016 0006   Sepsis Labs: @LABRCNTIP (procalcitonin:4,lacticidven:4)  ) Recent Results (from the past 240 hour(s))  Urine culture     Status: None (Preliminary result)   Collection Time: 04/02/16 12:06 AM  Result Value Ref Range Status   Specimen Description URINE, CATHETERIZED  Final   Special Requests NONE  Final   Culture PENDING  Incomplete    Report Status PENDING  Incomplete     Invalid input(s): PROCALCITONIN, LACTICACIDVEN   Radiology Studies: Ct Head Wo Contrast  Result Date: 04/02/2016 CLINICAL DATA:  Acute onset numbness today.  Throat irritation. EXAM: CT HEAD WITHOUT CONTRAST TECHNIQUE: Contiguous axial images were obtained from the base of the skull through the vertex without intravenous contrast. COMPARISON:  CT HEAD July 08, 2015 FINDINGS: BRAIN: Moderate to severe ventriculomegaly with slight disproportionate sulcal effacement at the convexities. Old LEFT thalamus infarct. Patchy to confluent supratentorial white matter hypodensities. No midline shift, mass effect, acute large vascular territory infarct or hemorrhage. No abnormal extra-axial fluid collections. VASCULAR: Minimal calcific atherosclerosis of the carotid siphons. SKULL: No skull fracture. No significant scalp soft tissue swelling. SINUSES/ORBITS: The mastoid air-cells and included paranasal sinuses are well-aerated.The included ocular globes and orbital contents are non-suspicious. OTHER: None. IMPRESSION: No acute intracranial process.  Stable examination including moderate to severe global parenchymal brain volume loss of the could be a component of normal pressure hydrocephalus. Moderate to severe chronic small vessel ischemic disease and old LEFT thalamus lacunar infarct. Electronically Signed   By: Elon Alas M.D.   On: 04/02/2016 01:42   Mr Brain Wo Contrast  Result Date: 04/02/2016 CLINICAL DATA:  Alteration of consciousness. EXAM: MRI HEAD WITHOUT CONTRAST TECHNIQUE: Multiplanar, multiecho pulse sequences of the brain and surrounding structures were obtained without intravenous contrast. COMPARISON:  CT head 04/02/2016.  MRI 02/20/2014. FINDINGS: Brain: Generalized atrophy with prominent subarachnoid space and ventricle size with progression since 2015. Negative for hydrocephalus. Negative for acute infarct. Chronic microvascular ischemic change in  the white matter. Chronic infarct left thalamus. Negative for intracranial hemorrhage. Negative for mass or edema. Vascular: Normal arterial flow voids. Skull and upper cervical spine: Negative Sinuses/Orbits: Mucosal edema in the paranasal sinuses. Mucous retention cyst left maxillary sinus. Mild mucosal edema in the mastoid sinus bilaterally. Other: None IMPRESSION: Moderate cerebral atrophy with progression since 2015. No evidence of hydrocephalus. Moderate chronic microvascular ischemic change.  No acute infarct. Electronically Signed   By: Franchot Gallo M.D.   On: 04/02/2016 07:42        Scheduled Meds: . ALPRAZolam  1 mg Oral TID  . cefTRIAXone (ROCEPHIN) IVPB 1 gram/50 mL D5W  1 g Intravenous Q24H  . clopidogrel  75 mg Oral QHS  . cycloSPORINE  1 drop Both Eyes BID  . enoxaparin (LOVENOX) injection  40 mg Subcutaneous Q24H  . hydroxychloroquine  200 mg Oral Daily  . loratadine  10 mg Oral Daily  . metoprolol tartrate  25 mg Oral BID  . nystatin   Topical TID   Or  . nystatin cream   Topical TID  . pantoprazole  40 mg Oral Daily  . PARoxetine  40 mg Oral Daily  . polyvinyl alcohol  1 drop Both Eyes Daily  . potassium chloride SA  40 mEq Oral Q8H  . spironolactone  25 mg Oral Daily   Continuous Infusions: . sodium chloride 75 mL/hr at 04/02/16 2135     LOS: 0 days    Time spent: 35 minutes    Rhonda Linan A, MD [facility] Pager [phone]  If 7PM-7AM, please contact night-coverage [email or web address] Password [email or web address] 04/03/2016, 11:25 AM

## 2016-04-03 NOTE — Care Management Note (Signed)
Case Management Note  Patient Details  Name: Robin Blackburn MRN: [id number] Date of Birth: Sep 15, 1945  Subjective/Objective:                  Pt is from home, she lives alone and has PD caregivers. She uses WC and walker for mobility. Has been to SNF in the past and is interested in going again. PT eval recommends SNF. CSW is aware and will work with pt on placement options.   Action/Plan: No CM needs anticipated.   Expected Discharge Date:    04/05/2016              Expected Discharge Plan:  [place]  In-House Referral:  Clinical Social Work  Discharge planning Services  CM Consult  Post Acute Care Choice:  NA Choice offered to:  NA  Status of Service:  Completed, signed off  Sherald Barge, RN 04/03/2016, 1:06 PM

## 2016-04-04 DIAGNOSIS — E876 Hypokalemia: Secondary | ICD-10-CM | POA: Diagnosis not present

## 2016-04-04 DIAGNOSIS — F13232 Sedative, hypnotic or anxiolytic dependence with withdrawal with perceptual disturbance: Secondary | ICD-10-CM | POA: Diagnosis not present

## 2016-04-04 DIAGNOSIS — N390 Urinary tract infection, site not specified: Secondary | ICD-10-CM | POA: Diagnosis not present

## 2016-04-04 DIAGNOSIS — E871 Hypo-osmolality and hyponatremia: Secondary | ICD-10-CM | POA: Diagnosis not present

## 2016-04-04 LAB — BASIC METABOLIC PANEL
ANION GAP: 5 (ref 5–15)
BUN: 5 mg/dL — ABNORMAL LOW (ref 6–20)
CALCIUM: 8.3 mg/dL — AB (ref 8.9–10.3)
CO2: 26 mmol/L (ref 22–32)
CREATININE: 0.59 mg/dL (ref 0.44–1.00)
Chloride: 101 mmol/L (ref 101–111)
GLUCOSE: 99 mg/dL (ref 65–99)
Potassium: 5.3 mmol/L — ABNORMAL HIGH (ref 3.5–5.1)
Sodium: 132 mmol/L — ABNORMAL LOW (ref 135–145)

## 2016-04-04 MED ORDER — CEFUROXIME AXETIL 500 MG PO TABS
500.0000 mg | ORAL_TABLET | Freq: Two times a day (BID) | ORAL | 0 refills | Status: AC
Start: 2016-04-04 — End: 2016-04-09

## 2016-04-04 MED ORDER — HYDROCODONE-ACETAMINOPHEN 7.5-325 MG PO TABS: 1.0000 | ORAL_TABLET | Freq: Four times a day (QID) | ORAL | 0 refills | Status: DC | PRN

## 2016-04-04 MED ORDER — ALPRAZOLAM 1 MG PO TABS: 1.0000 mg | ORAL_TABLET | Freq: Three times a day (TID) | ORAL | 0 refills | Status: DC | PRN

## 2016-04-04 MED ORDER — NYSTATIN 100000 UNIT/GM EX POWD: Freq: Three times a day (TID) | CUTANEOUS | 0 refills | Status: DC

## 2016-04-04 NOTE — Progress Notes (Signed)
Pharmacy Antibiotic Note  Robin Blackburn is a 70 y.o. female admitted on 04/01/2016 with UTI.  Pharmacy has been consulted for rocephin dosing.  Plan: Cont rocephin 1 gm IV q24 hours Duration of therapy per MD (anticipate 5 days total Rx) F/u cultures and clinical course  Height: 5\' 6"  (167.6 cm) Weight: 204 lb 12.8 oz (92.9 kg) IBW/kg (Calculated) : 59.3  Temp (24hrs), Avg:98.1 F (36.7 C), Min:97.8 F (36.6 C), Max:98.4 F (36.9 C)   Recent Labs Lab 04/02/16 0014 04/02/16 0638 04/03/16 0558  WBC 17.8* 17.7* 10.3  CREATININE 0.62 0.64 0.62    Estimated Creatinine Clearance: 75.1 mL/min (by C-G formula based on SCr of 0.62 mg/dL).    Allergies  Allergen Reactions  . Demerol [Meperidine] Anaphylaxis  . Hydromorphone Anaphylaxis  . Penicillins Anaphylaxis    Has patient had a PCN reaction causing immediate rash, facial/tongue/throat swelling, SOB or lightheadedness with hypotension: Yes Has patient had a PCN reaction causing severe rash involving mucus membranes or skin necrosis: No Has patient had a PCN reaction that required hospitalization No Has patient had a PCN reaction occurring within the last 10 years: No If all of the above answers are "NO", then may proceed with Cephalosporin use.   . Sulfa Antibiotics Nausea And Vomiting  . Bisphosphonates     GI intolerance  . Fentanyl Other (See Comments)    "felt like I had demons in my head"  . Iohexol   . Percocet [Oxycodone-Acetaminophen] Swelling    Mouth swelling.  . Shellfish Allergy     Glucosamine not an option  . Terramycin [Oxytetracycline] Nausea And Vomiting  . Ciprofloxacin Nausea Only and Rash  . Levaquin [Levofloxacin In D5w] Nausea And Vomiting and Rash  . Morphine And Related Rash   Antimicrobials this admission: rocephin 10/25 >>   Recent Results (from the past 240 hour(s))  Urine culture     Status: Abnormal   Collection Time: 04/02/16 12:06 AM  Result Value Ref Range Status   Specimen  Description URINE, CATHETERIZED  Final   Special Requests NONE  Final   Culture (A)  Final    >=100,000 COLONIES/mL DIPHTHEROIDS(CORYNEBACTERIUM SPECIES)   Report Status 04/03/2016 FINAL  Final   Thank you for allowing pharmacy to be a part of this patient's care.  Hart Robinsons, PharmD Clinical Pharmacist Pager:  [phone] 04/04/2016   04/04/2016 10:52 AM

## 2016-04-04 NOTE — Progress Notes (Signed)
Patient with orders to be discharge to [facility]. Report called to nurse at [facility] [place]. Discharge packet sent with patient. Prescriptions sent with patient. Patient stable. Patient left via EMS.

## 2016-04-04 NOTE — Clinical Social Work Placement (Addendum)
Patient has accepted bed at [facility]. Will Discharge today. Will transport by EMS. Patient to notify family per wishes. No other needs. [facility] agreeable to accept.  [facility]  CLINICAL SOCIAL WORK PLACEMENT  NOTE  Date:  04/04/2016  Patient Details  Name: Robin Blackburn MRN: [id number] Date of Birth: 1945-08-28  Clinical Social Work is seeking post-discharge placement for this patient at the Between level of care (*CSW will initial, date and re-position this form in  chart as items are completed):  Yes   Patient/family provided with [facility] Work Department's list of facilities offering this level of care within the geographic area requested by the patient (or if unable, by the patient's family).  Yes   Patient/family informed of their freedom to choose among providers that offer the needed level of care, that participate in Medicare, Medicaid or managed care program needed by the patient, have an available bed and are willing to accept the patient.  Yes   Patient/family informed of [facility]'s ownership interest in [facility] and [facility], as well as of the fact that they are under no obligation to receive care at these facilities.  PASRR submitted to EDS on       PASRR number received on       Existing PASRR number confirmed on 04/03/16     FL2 transmitted to all facilities in geographic area requested by pt/family on 04/03/16     FL2 transmitted to all facilities within larger geographic area on       Patient informed that his/her managed care company has contracts with or will negotiate with certain facilities, including the following:            Patient/family informed of bed offers received.  04/04/2016  Patient chooses bed at      [facility] recommends and patient chooses bed at     [facility] Patient to be transferred to   on  .  SNF  04/04/2016   Patient to be transferred to facility by     EMS  Patient family notified  on   of transfer. none  Name of family member notified:      Patient to tell family.  PHYSICIAN Please sign FL2     Additional Comment:    _______________________________________________ Lilly Cove, LCSW 04/04/2016, 10:29 AM

## 2016-04-04 NOTE — Discharge Summary (Signed)
Physician Discharge Summary  Robin Blackburn [id number] DOB: 1946-04-17 DOA: 04/01/2016  PCP: Glo Herring., MD  Admit date: 04/01/2016 Discharge date: 04/04/2016  Admitted From: Home Disposition: SNF  Recommendations for Outpatient Follow-up:  1. Follow up with PCP in 1-2 weeks 2. Please obtain BMP/CBC in one week  Home Health: NA Equipment/Devices: NA  Discharge Condition: Stable CODE STATUS: Full Diet recommendation: Heart Healthy  Brief/Interim Summary: Robin Blackburn  is a 70 y.o. female, With history of CVA, fibromyalgia, hypertension, rheumatoid arthritis who came to the hospital with complaints of numbness of fingertips, smelling of perfumes, spaced out feeling for past 1 week. Patient says that she ran out of Xanax a week ago, and stopped taking Xanax. She has been taking Xanax 1 mg 3 times a day for more than 20 years. Also had symptoms of nausea, blurry vision, dizziness, decreased appetite. Patient also complains of dysuria, no chest pain or shortness of breath. She also has a rash on her neck, which has been present before stopping Xanax.  In the ED CT scan of the head was negative for acute abnormality. UA was found to be abnormal. Patient started on ceftriaxone.  Discharge Diagnoses:  Active Problems:   Hyponatremia   Hypokalemia   Urinary tract infection without hematuria   UTI (urinary tract infection)   Benzodiazepine withdrawal with perceptual disturbance (HCC)   Benzodiazepine withdrawal -Patient presented with altered consciousness, shakiness and anxiety. -Reportedly she takes Xanax 5 times a day, this is prescribed only to be 3 times a day as needed. -MRI of her brain showed no acute events as well as CT, she ran out of Xanax then she started to have these symptoms. -She is back to her baseline.  UTI -Patient reported LUT symptoms, her urinalysis has many bacteria and positive nitrite, started on Rocephin. -On discharge Ceftin for 5 more days  (listed as allergic to penicillin but she tolerated ceftriaxone).  Rheumatoid arthritis- continue hydroxychloroquine.  Hypertension- blood pressure stable, continue metoprolol  History of CVA- continue Plavix  Hypokalemia, Potassium 3.2 on admission, improved to 4.3 after oral supplements. On low dose of Aldactone  25 mg continued. Discharge potassium is 5.3  Hyponatremia- she has chronic hyponatremia, presented with sodium of 130, hydrochlorothiazide discontinued.  Altered sense of smell -Patient reported that she smells things that are not in the room, denies any history of seizure. MRI normal. -Reported history of sinus problems.   Discharge Instructions  Discharge Instructions    Diet - low sodium heart healthy    Complete by:  As directed    Increase activity slowly    Complete by:  As directed        Medication List    STOP taking these medications   DEPO-MEDROL IJ   hydrochlorothiazide 12.5 MG capsule Commonly known as:  MICROZIDE     TAKE these medications   acetaminophen 325 MG tablet Commonly known as:  TYLENOL Take 650 mg by mouth daily. May give a dose of Hydrocodone/APAP 7.5/5 prn along with this if needed   ALPRAZolam 1 MG tablet Commonly known as:  XANAX Take 1 tablet (1 mg total) by mouth 3 (three) times daily as needed for anxiety. Take one tablet three times daily. Monitor for frequent complaints, frequent movements SOB document behavior What changed:  how much to take  how to take this  when to take this  reasons to take this   cefUROXime 500 MG tablet Commonly known as:  CEFTIN Take 1 tablet (500 mg  total) by mouth 2 (two) times daily with a meal.   Cholecalciferol 5000 units Tabs Will take 5,000 unit along with 2,000 unit daily for a 7,000 unit daily   clopidogrel 75 MG tablet Commonly known as:  PLAVIX Take 75 mg by mouth at bedtime.   desloratadine 5 MG tablet Commonly known as:  CLARINEX Take 5 mg by mouth daily.    dicyclomine 20 MG tablet Commonly known as:  BENTYL Take 20 mg by mouth every 6 (six) hours as needed. Abdominal cramps   docusate sodium 100 MG capsule Commonly known as:  COLACE Take 100 mg by mouth 2 (two) times daily as needed for mild constipation.   HYDROcodone-acetaminophen 7.5-325 MG tablet Commonly known as:  NORCO Take 1 tablet by mouth every 6 (six) hours as needed.   hydroxychloroquine 200 MG tablet Commonly known as:  PLAQUENIL Take 200 mg by mouth daily.   lansoprazole 30 MG capsule Commonly known as:  PREVACID Take 30 mg by mouth 2 (two) times daily.   methocarbamol 750 MG tablet Commonly known as:  ROBAXIN Take 375-750 mg by mouth every 6 (six) hours as needed for muscle spasms.   METHYL SALICYLATE-LIDO-MENTHOL EX Arthritis hot pain relief apply thin layer 15-10 % three times a to left leg as needed   metoprolol tartrate 25 MG tablet Commonly known as:  LOPRESSOR Take 1 tablet (25 mg total) by mouth 2 (two) times daily.   nystatin powder Commonly known as:  MYCOSTATIN/NYSTOP Apply topically 3 (three) times daily.   ondansetron 4 MG tablet Commonly known as:  ZOFRAN Take 4 mg by mouth every 6 (six) hours as needed. Give with plaquenil daily at 10 am. Give 4 mg as needed for N / V   PARoxetine 40 MG tablet Commonly known as:  PAXIL Take 1 tablet by mouth daily.   Propylene Glycol 0.6 % Soln Place 1 drop into both eyes daily. At noon.   RESTASIS 0.05 % ophthalmic emulsion Generic drug:  cycloSPORINE Place 1 drop into both eyes 2 (two) times daily.   Simethicone 125 MG Tabs Chew 125 mg by mouth at bedtime. And as needed.   sodium chloride 0.65 % Soln nasal spray Commonly known as:  OCEAN Place 1 spray into both nostrils as needed for congestion.   spironolactone 25 MG tablet Commonly known as:  ALDACTONE Take 25 mg by mouth daily.   zolpidem 12.5 MG CR tablet Commonly known as:  AMBIEN CR Take 1 tablet (12.5 mg total) by mouth at bedtime as  needed for sleep.       Allergies  Allergen Reactions  . Demerol [Meperidine] Anaphylaxis  . Hydromorphone Anaphylaxis  . Penicillins Anaphylaxis    Has patient had a PCN reaction causing immediate rash, facial/tongue/throat swelling, SOB or lightheadedness with hypotension: Yes Has patient had a PCN reaction causing severe rash involving mucus membranes or skin necrosis: No Has patient had a PCN reaction that required hospitalization No Has patient had a PCN reaction occurring within the last 10 years: No If all of the above answers are "NO", then may proceed with Cephalosporin use.   . Sulfa Antibiotics Nausea And Vomiting  . Bisphosphonates     GI intolerance  . Fentanyl Other (See Comments)    "felt like I had demons in my head"  . Iohexol   . Percocet [Oxycodone-Acetaminophen] Swelling    Mouth swelling.  . Shellfish Allergy     Glucosamine not an option  . Terramycin [Oxytetracycline] Nausea And  Vomiting  . Ciprofloxacin Nausea Only and Rash  . Levaquin [Levofloxacin In D5w] Nausea And Vomiting and Rash  . Morphine And Related Rash    Consultations:  None   Procedures/Studies: Ct Head Wo Contrast  Result Date: 04/02/2016 CLINICAL DATA:  Acute onset numbness today.  Throat irritation. EXAM: CT HEAD WITHOUT CONTRAST TECHNIQUE: Contiguous axial images were obtained from the base of the skull through the vertex without intravenous contrast. COMPARISON:  CT HEAD July 08, 2015 FINDINGS: BRAIN: Moderate to severe ventriculomegaly with slight disproportionate sulcal effacement at the convexities. Old LEFT thalamus infarct. Patchy to confluent supratentorial white matter hypodensities. No midline shift, mass effect, acute large vascular territory infarct or hemorrhage. No abnormal extra-axial fluid collections. VASCULAR: Minimal calcific atherosclerosis of the carotid siphons. SKULL: No skull fracture. No significant scalp soft tissue swelling. SINUSES/ORBITS: The mastoid  air-cells and included paranasal sinuses are well-aerated.The included ocular globes and orbital contents are non-suspicious. OTHER: None. IMPRESSION: No acute intracranial process. Stable examination including moderate to severe global parenchymal brain volume loss of the could be a component of normal pressure hydrocephalus. Moderate to severe chronic small vessel ischemic disease and old LEFT thalamus lacunar infarct. Electronically Signed   By: Elon Alas M.D.   On: 04/02/2016 01:42   Mr Brain Wo Contrast  Result Date: 04/02/2016 CLINICAL DATA:  Alteration of consciousness. EXAM: MRI HEAD WITHOUT CONTRAST TECHNIQUE: Multiplanar, multiecho pulse sequences of the brain and surrounding structures were obtained without intravenous contrast. COMPARISON:  CT head 04/02/2016.  MRI 02/20/2014. FINDINGS: Brain: Generalized atrophy with prominent subarachnoid space and ventricle size with progression since 2015. Negative for hydrocephalus. Negative for acute infarct. Chronic microvascular ischemic change in the white matter. Chronic infarct left thalamus. Negative for intracranial hemorrhage. Negative for mass or edema. Vascular: Normal arterial flow voids. Skull and upper cervical spine: Negative Sinuses/Orbits: Mucosal edema in the paranasal sinuses. Mucous retention cyst left maxillary sinus. Mild mucosal edema in the mastoid sinus bilaterally. Other: None IMPRESSION: Moderate cerebral atrophy with progression since 2015. No evidence of hydrocephalus. Moderate chronic microvascular ischemic change.  No acute infarct. Electronically Signed   By: Franchot Gallo M.D.   On: 04/02/2016 07:42    (Echo, Carotid, EGD, Colonoscopy, ERCP)    Subjective:   Discharge Exam: Vitals:   04/03/16 2239 04/04/16 0700  BP: (!) 145/69 127/63  Pulse: 71 60  Resp: 18 17  Temp: 97.8 F (36.6 C) 98.2 F (36.8 C)   Vitals:   04/03/16 1613 04/03/16 2236 04/03/16 2239 04/04/16 0700  BP: 130/75 (!) 145/69 (!)  145/69 127/63  Pulse: 66 70 71 60  Resp: 16  18 17   Temp: 98.4 F (36.9 C)  97.8 F (36.6 C) 98.2 F (36.8 C)  TempSrc:   Oral Oral  SpO2: 97%  95% 99%  Weight:      Height:        General: Pt is alert, awake, not in acute distress Cardiovascular: RRR, S1/S2 +, no rubs, no gallops Respiratory: CTA bilaterally, no wheezing, no rhonchi Abdominal: Soft, NT, ND, bowel sounds + Extremities: no edema, no cyanosis    The results of significant diagnostics from this hospitalization (including imaging, microbiology, ancillary and laboratory) are listed below for reference.     Microbiology: Recent Results (from the past 240 hour(s))  Urine culture     Status: Abnormal   Collection Time: 04/02/16 12:06 AM  Result Value Ref Range Status   Specimen Description URINE, CATHETERIZED  Final   Special Requests  NONE  Final   Culture (A)  Final    >=100,000 COLONIES/mL DIPHTHEROIDS(CORYNEBACTERIUM SPECIES)   Report Status 04/03/2016 FINAL  Final     Labs: BNP (last 3 results) No results for input(s): BNP in the last 8760 hours. Basic Metabolic Panel:  Recent Labs Lab 04/02/16 0014 04/02/16 0638 04/03/16 0558 04/04/16 1050  NA 130* 130* 131* 132*  K 3.3* 3.2* 4.3 5.3*  CL 91* 93* 100* 101  CO2 28 29 24 26   GLUCOSE 134* 129* 108* 99  BUN 6 6 6  5*  CREATININE 0.62 0.64 0.62 0.59  CALCIUM 8.7* 8.6* 8.3* 8.3*   Liver Function Tests:  Recent Labs Lab 04/02/16 0014 04/02/16 0638  AST 18 17  ALT 12* 11*  ALKPHOS 76 73  BILITOT 0.7 0.7  PROT 7.0 6.7  ALBUMIN 3.5 3.3*   No results for input(s): LIPASE, AMYLASE in the last 168 hours. No results for input(s): AMMONIA in the last 168 hours. CBC:  Recent Labs Lab 04/02/16 0014 04/02/16 0638 04/03/16 0558  WBC 17.8* 17.7* 10.3  NEUTROABS 12.8*  --   --   HGB 13.8 13.7 12.4  HCT 41.1 40.6 38.3  MCV 91.3 92.1 94.8  PLT 526* 513* 384   Cardiac Enzymes:  Recent Labs Lab 04/02/16 0014  TROPONINI <0.03    BNP: Invalid input(s): POCBNP CBG: No results for input(s): GLUCAP in the last 168 hours. D-Dimer No results for input(s): DDIMER in the last 72 hours. Hgb A1c No results for input(s): HGBA1C in the last 72 hours. Lipid Profile No results for input(s): CHOL, HDL, LDLCALC, TRIG, CHOLHDL, LDLDIRECT in the last 72 hours. Thyroid function studies No results for input(s): TSH, T4TOTAL, T3FREE, THYROIDAB in the last 72 hours.  Invalid input(s): FREET3 Anemia work up No results for input(s): VITAMINB12, FOLATE, FERRITIN, TIBC, IRON, RETICCTPCT in the last 72 hours. Urinalysis    Component Value Date/Time   COLORURINE YELLOW 04/02/2016 0006   APPEARANCEUR CLEAR 04/02/2016 0006   LABSPEC 1.015 04/02/2016 0006   PHURINE 7.5 04/02/2016 0006   GLUCOSEU NEGATIVE 04/02/2016 0006   HGBUR SMALL (A) 04/02/2016 0006   BILIRUBINUR NEGATIVE 04/02/2016 0006   KETONESUR TRACE (A) 04/02/2016 0006   PROTEINUR NEGATIVE 04/02/2016 0006   UROBILINOGEN 0.2 04/10/2015 2212   NITRITE POSITIVE (A) 04/02/2016 0006   LEUKOCYTESUR NEGATIVE 04/02/2016 0006   Sepsis Labs Invalid input(s): PROCALCITONIN,  WBC,  LACTICIDVEN Microbiology Recent Results (from the past 240 hour(s))  Urine culture     Status: Abnormal   Collection Time: 04/02/16 12:06 AM  Result Value Ref Range Status   Specimen Description URINE, CATHETERIZED  Final   Special Requests NONE  Final   Culture (A)  Final    >=100,000 COLONIES/mL DIPHTHEROIDS(CORYNEBACTERIUM SPECIES)   Report Status 04/03/2016 FINAL  Final     Time coordinating discharge: Over 30 minutes  SIGNED:   Birdie Hopes, MD  [facility] 04/04/2016, 12:05 PM Pager   If 7PM-7AM, please contact night-coverage [email or web address] Password [email or web address]

## 2016-07-02 ENCOUNTER — Telehealth: Payer: Self-pay | Admitting: Rheumatology

## 2016-07-02 ENCOUNTER — Other Ambulatory Visit: Payer: Self-pay | Admitting: Rheumatology

## 2016-07-02 NOTE — Telephone Encounter (Signed)
Okay to refill Plaquenil at [facility] please do ninety-day supply with no refill

## 2016-07-02 NOTE — Telephone Encounter (Signed)
Called patient to schedule follow up appt because she is due and patient states she was in the hospital and is currently in rehab and does not know when she is getting out just yet. Patient sates she will call back to schedule the appointment once she is out of rehab.   Patient is requesting a refill of plaquenil to be sent to [facility] because she will run out before she is out of rehab.

## 2016-07-02 NOTE — Telephone Encounter (Addendum)
Last Visit: 09/11/15 Next visit: was due October 2017. Message sent to the front to schedule patient.  Labs: 04/02/16 WBC 17.7 Platelets 513 Sodium 130, Potassium 3.2, Calcium 8.6 ALT 11 (Patient was in the hospital at the time these labs were drawn) PLQ Eye Exam 03/2015 WNL  Okay to refill  30 day supply PLQ?

## 2016-07-03 NOTE — Telephone Encounter (Signed)
Prescription sent to the pharmacy on 07/02/16.

## 2016-10-16 ENCOUNTER — Telehealth: Payer: Self-pay | Admitting: Rheumatology

## 2016-10-16 ENCOUNTER — Other Ambulatory Visit: Payer: Self-pay | Admitting: Rheumatology

## 2016-10-16 NOTE — Telephone Encounter (Signed)
Called patient to schedule follow up with Dr. Estanislado Pandy or Mr. Carlyon Shadow due to needing a medication refill. Patient has not been seen since 09/2015 and is due for a follow up, per Mr. Carlyon Shadow. Patient has been in the hospital and a rehab facility. I explained to patient how important a follow up is and she states "I just don't think I can come in, I really just need my medication refilled." I again stressed the importance of the appointment and the patient finally agreed to schedule.

## 2016-10-16 NOTE — Telephone Encounter (Signed)
Last Visit: 09/11/15 Next visit: was due October 2017. Message sent to the front to schedule patient.  Labs: 04/02/16 WBC 17.7 Platelets 513 Sodium 130, Potassium 3.2, Calcium 8.6 ALT 11 (Patient was in the hospital at the time these labs were drawn) [place] Exam 03/2015 WNL  Patient states she was just released home from the nursing home. Patient states she has PT coming to her home to work with her. Patient is requesting a prescription until she is able to get into the office.   Okay to refill PLQ?

## 2016-10-16 NOTE — Telephone Encounter (Signed)
Patient was schor 11/07/2016.As a result, I'll be happy to give her 30 day supply of Plaquenil.She needs a Plaquenil eye exam ASAP, please ask her to schedule one and let us know what that he is so we can enter it into her chart. The last Plaquenil eye exam we have his October 2016 so we definitely need an appointment noted in the chart for the next one place

## 2016-10-17 NOTE — Telephone Encounter (Signed)
Per Dr. Estanislado Pandy patient to discontinue PLQ and discharge letter to be mailed to patient.

## 2016-11-11 ENCOUNTER — Ambulatory Visit: Payer: Medicare Other | Admitting: Rheumatology

## 2016-11-25 ENCOUNTER — Emergency Department (HOSPITAL_COMMUNITY): Payer: Medicare Other

## 2016-11-25 ENCOUNTER — Inpatient Hospital Stay (HOSPITAL_COMMUNITY)
Admission: EM | Admit: 2016-11-25 | Discharge: 2016-11-28 | DRG: 689 | Disposition: A | Discharge status: Skilled Nursing Facility | Payer: Medicare Other | Attending: Internal Medicine | Admitting: Internal Medicine

## 2016-11-25 ENCOUNTER — Encounter (HOSPITAL_COMMUNITY): Payer: Self-pay | Admitting: Emergency Medicine

## 2016-11-25 DIAGNOSIS — B999 Unspecified infectious disease: Secondary | ICD-10-CM | POA: Diagnosis present

## 2016-11-25 DIAGNOSIS — M1711 Unilateral primary osteoarthritis, right knee: Secondary | ICD-10-CM | POA: Diagnosis present

## 2016-11-25 DIAGNOSIS — R410 Disorientation, unspecified: Secondary | ICD-10-CM | POA: Diagnosis not present

## 2016-11-25 DIAGNOSIS — E876 Hypokalemia: Secondary | ICD-10-CM | POA: Diagnosis present

## 2016-11-25 DIAGNOSIS — Z8673 Personal history of transient ischemic attack (TIA), and cerebral infarction without residual deficits: Secondary | ICD-10-CM | POA: Diagnosis not present

## 2016-11-25 DIAGNOSIS — E871 Hypo-osmolality and hyponatremia: Secondary | ICD-10-CM | POA: Diagnosis present

## 2016-11-25 DIAGNOSIS — N390 Urinary tract infection, site not specified: Secondary | ICD-10-CM | POA: Diagnosis present

## 2016-11-25 DIAGNOSIS — Z881 Allergy status to other antibiotic agents status: Secondary | ICD-10-CM | POA: Diagnosis not present

## 2016-11-25 DIAGNOSIS — M069 Rheumatoid arthritis, unspecified: Secondary | ICD-10-CM | POA: Diagnosis present

## 2016-11-25 DIAGNOSIS — F419 Anxiety disorder, unspecified: Secondary | ICD-10-CM | POA: Diagnosis present

## 2016-11-25 DIAGNOSIS — Z880 Allergy status to penicillin: Secondary | ICD-10-CM | POA: Diagnosis not present

## 2016-11-25 DIAGNOSIS — Z882 Allergy status to sulfonamides status: Secondary | ICD-10-CM

## 2016-11-25 DIAGNOSIS — M25561 Pain in right knee: Secondary | ICD-10-CM | POA: Diagnosis not present

## 2016-11-25 DIAGNOSIS — K580 Irritable bowel syndrome with diarrhea: Secondary | ICD-10-CM | POA: Diagnosis present

## 2016-11-25 DIAGNOSIS — Z91013 Allergy to seafood: Secondary | ICD-10-CM | POA: Diagnosis not present

## 2016-11-25 DIAGNOSIS — Z79899 Other long term (current) drug therapy: Secondary | ICD-10-CM | POA: Diagnosis not present

## 2016-11-25 DIAGNOSIS — G9341 Metabolic encephalopathy: Secondary | ICD-10-CM | POA: Diagnosis present

## 2016-11-25 DIAGNOSIS — I10 Essential (primary) hypertension: Secondary | ICD-10-CM | POA: Diagnosis present

## 2016-11-25 DIAGNOSIS — M25562 Pain in left knee: Secondary | ICD-10-CM | POA: Diagnosis not present

## 2016-11-25 DIAGNOSIS — R441 Visual hallucinations: Secondary | ICD-10-CM | POA: Diagnosis not present

## 2016-11-25 DIAGNOSIS — G8929 Other chronic pain: Secondary | ICD-10-CM | POA: Diagnosis not present

## 2016-11-25 DIAGNOSIS — E860 Dehydration: Secondary | ICD-10-CM | POA: Diagnosis present

## 2016-11-25 LAB — URINALYSIS, ROUTINE W REFLEX MICROSCOPIC
Bilirubin Urine: NEGATIVE
GLUCOSE, UA: NEGATIVE mg/dL
Ketones, ur: NEGATIVE mg/dL
Nitrite: NEGATIVE
PH: 5 (ref 5.0–8.0)
Protein, ur: 30 mg/dL — AB
SPECIFIC GRAVITY, URINE: 1.021 (ref 1.005–1.030)

## 2016-11-25 LAB — COMPREHENSIVE METABOLIC PANEL
ALBUMIN: 3.2 g/dL — AB (ref 3.5–5.0)
ALK PHOS: 90 U/L (ref 38–126)
ALT: 15 U/L (ref 14–54)
ANION GAP: 13 (ref 5–15)
AST: 29 U/L (ref 15–41)
BILIRUBIN TOTAL: 0.8 mg/dL (ref 0.3–1.2)
BUN: 5 mg/dL — AB (ref 6–20)
CALCIUM: 8.5 mg/dL — AB (ref 8.9–10.3)
CO2: 25 mmol/L (ref 22–32)
Chloride: 94 mmol/L — ABNORMAL LOW (ref 101–111)
Creatinine, Ser: 0.82 mg/dL (ref 0.44–1.00)
GFR calc Af Amer: >60 mL/min (ref >60)
GFR calc non Af Amer: >60 mL/min (ref >60)
GLUCOSE: 128 mg/dL — AB (ref 65–99)
Potassium: 3.4 mmol/L — ABNORMAL LOW (ref 3.5–5.1)
SODIUM: 132 mmol/L — AB (ref 135–145)
TOTAL PROTEIN: 6.8 g/dL (ref 6.5–8.1)

## 2016-11-25 LAB — CBC WITH DIFFERENTIAL/PLATELET
BASOS ABS: 0 10*3/uL (ref 0.0–0.1)
BASOS PCT: 0 %
EOS ABS: 0 10*3/uL (ref 0.0–0.7)
Eosinophils Relative: 0 %
HEMATOCRIT: 44.7 % (ref 36.0–46.0)
HEMOGLOBIN: 14.7 g/dL (ref 12.0–15.0)
Lymphocytes Relative: 27 %
Lymphs Abs: 2.8 10*3/uL (ref 0.7–4.0)
MCH: 30.6 pg (ref 26.0–34.0)
MCHC: 32.9 g/dL (ref 30.0–36.0)
MCV: 92.9 fL (ref 78.0–100.0)
Monocytes Absolute: 0.7 10*3/uL (ref 0.1–1.0)
Monocytes Relative: 7 %
NEUTROS ABS: 6.9 10*3/uL (ref 1.7–7.7)
NEUTROS PCT: 66 %
Platelets: 475 10*3/uL — ABNORMAL HIGH (ref 150–400)
RBC: 4.81 MIL/uL (ref 3.87–5.11)
RDW: 13.4 % (ref 11.5–15.5)
WBC: 10.6 10*3/uL — AB (ref 4.0–10.5)

## 2016-11-25 LAB — RAPID URINE DRUG SCREEN, HOSP PERFORMED
Amphetamines: NOT DETECTED
BARBITURATES: NOT DETECTED
BENZODIAZEPINES: POSITIVE — AB
COCAINE: NOT DETECTED
Opiates: NOT DETECTED
TETRAHYDROCANNABINOL: NOT DETECTED

## 2016-11-25 LAB — ETHANOL: Alcohol, Ethyl (B): <5 mg/dL (ref <5)

## 2016-11-25 MED ORDER — CLOPIDOGREL BISULFATE 75 MG PO TABS: 75.0000 mg | ORAL_TABLET | Freq: Every day | ORAL | Status: DC

## 2016-11-25 MED ORDER — ATORVASTATIN CALCIUM 10 MG PO TABS
10.0000 mg | ORAL_TABLET | Freq: Every day | ORAL | Status: DC
  Administered 2016-11-25 – 2016-11-27 (×3): 10 mg via ORAL
  Filled 2016-11-25 (×3): qty 1

## 2016-11-25 MED ORDER — POTASSIUM CHLORIDE IN NACL 40-0.9 MEQ/L-% IV SOLN
INTRAVENOUS | Status: DC
  Administered 2016-11-25: 100 mL/h via INTRAVENOUS
  Filled 2016-11-25 (×3): qty 1000

## 2016-11-25 MED ORDER — HALOPERIDOL LACTATE 5 MG/ML IJ SOLN: 5.0000 mg | Freq: Four times a day (QID) | INTRAMUSCULAR | Status: DC | PRN

## 2016-11-25 MED ORDER — SODIUM CHLORIDE 0.9% FLUSH
3.0000 mL | Freq: Two times a day (BID) | INTRAVENOUS | Status: DC
  Administered 2016-11-27: 3 mL via INTRAVENOUS

## 2016-11-25 MED ORDER — SODIUM CHLORIDE 0.9% FLUSH: 3.0000 mL | INTRAVENOUS | Status: DC | PRN

## 2016-11-25 MED ORDER — HYDROXYCHLOROQUINE SULFATE 200 MG PO TABS
200.0000 mg | ORAL_TABLET | Freq: Every day | ORAL | Status: DC
  Administered 2016-11-26 – 2016-11-28 (×3): 200 mg via ORAL
  Filled 2016-11-25 (×6): qty 1

## 2016-11-25 MED ORDER — SODIUM CHLORIDE 0.9 % IV SOLN: 250.0000 mL | INTRAVENOUS | Status: DC | PRN

## 2016-11-25 MED ORDER — ALBUTEROL SULFATE (2.5 MG/3ML) 0.083% IN NEBU: 2.5000 mg | INHALATION_SOLUTION | RESPIRATORY_TRACT | Status: DC | PRN

## 2016-11-25 MED ORDER — ONDANSETRON HCL 4 MG/2ML IJ SOLN
4.0000 mg | Freq: Once | INTRAMUSCULAR | Status: AC
  Administered 2016-11-25: 4 mg via INTRAVENOUS
  Filled 2016-11-25: qty 2

## 2016-11-25 MED ORDER — PANTOPRAZOLE SODIUM 40 MG PO TBEC
40.0000 mg | DELAYED_RELEASE_TABLET | Freq: Every day | ORAL | Status: DC
  Administered 2016-11-26 – 2016-11-28 (×3): 40 mg via ORAL
  Filled 2016-11-25 (×3): qty 1

## 2016-11-25 MED ORDER — SALINE SPRAY 0.65 % NA SOLN: 1.0000 | NASAL | Status: DC | PRN

## 2016-11-25 MED ORDER — LOPERAMIDE HCL 2 MG PO CAPS: 2.0000 mg | ORAL_CAPSULE | Freq: Four times a day (QID) | ORAL | Status: DC | PRN

## 2016-11-25 MED ORDER — ACETAMINOPHEN 325 MG PO TABS
650.0000 mg | ORAL_TABLET | Freq: Four times a day (QID) | ORAL | Status: DC | PRN
  Administered 2016-11-27 – 2016-11-28 (×3): 650 mg via ORAL
  Filled 2016-11-25 (×3): qty 2

## 2016-11-25 MED ORDER — TRAZODONE HCL 50 MG PO TABS
100.0000 mg | ORAL_TABLET | Freq: Every day | ORAL | Status: DC
  Administered 2016-11-25 – 2016-11-27 (×3): 100 mg via ORAL
  Filled 2016-11-25 (×3): qty 2

## 2016-11-25 MED ORDER — ENSURE ENLIVE PO LIQD
237.0000 mL | Freq: Two times a day (BID) | ORAL | Status: DC
  Administered 2016-11-28 (×2): 237 mL via ORAL

## 2016-11-25 MED ORDER — ASPIRIN 81 MG PO CHEW
81.0000 mg | CHEWABLE_TABLET | Freq: Every day | ORAL | Status: DC
  Administered 2016-11-25 – 2016-11-28 (×4): 81 mg via ORAL
  Filled 2016-11-25 (×4): qty 1

## 2016-11-25 MED ORDER — ONDANSETRON HCL 4 MG PO TABS
4.0000 mg | ORAL_TABLET | Freq: Every day | ORAL | Status: DC
  Administered 2016-11-26 – 2016-11-28 (×3): 4 mg via ORAL
  Filled 2016-11-25 (×3): qty 1

## 2016-11-25 MED ORDER — ONDANSETRON HCL 4 MG PO TABS: 4.0000 mg | ORAL_TABLET | Freq: Three times a day (TID) | ORAL | Status: DC | PRN

## 2016-11-25 MED ORDER — METOPROLOL TARTRATE 25 MG PO TABS
25.0000 mg | ORAL_TABLET | Freq: Two times a day (BID) | ORAL | Status: DC
  Administered 2016-11-25 – 2016-11-28 (×6): 25 mg via ORAL
  Filled 2016-11-25 (×6): qty 1

## 2016-11-25 MED ORDER — METHOCARBAMOL 500 MG PO TABS
500.0000 mg | ORAL_TABLET | Freq: Three times a day (TID) | ORAL | Status: DC
  Administered 2016-11-25 – 2016-11-28 (×8): 500 mg via ORAL
  Filled 2016-11-25 (×8): qty 1

## 2016-11-25 MED ORDER — SPIRONOLACTONE 25 MG PO TABS: 25.0000 mg | ORAL_TABLET | Freq: Every day | ORAL | Status: DC

## 2016-11-25 MED ORDER — BUSPIRONE HCL 5 MG PO TABS
10.0000 mg | ORAL_TABLET | Freq: Three times a day (TID) | ORAL | Status: DC
  Administered 2016-11-25 – 2016-11-28 (×8): 10 mg via ORAL
  Filled 2016-11-25 (×8): qty 2

## 2016-11-25 MED ORDER — SIMETHICONE 80 MG PO CHEW
80.0000 mg | CHEWABLE_TABLET | Freq: Every day | ORAL | Status: DC
  Administered 2016-11-25 – 2016-11-27 (×3): 80 mg via ORAL
  Filled 2016-11-25 (×3): qty 1

## 2016-11-25 MED ORDER — ONDANSETRON HCL 4 MG PO TABS: 4.0000 mg | ORAL_TABLET | Freq: Four times a day (QID) | ORAL | Status: DC | PRN

## 2016-11-25 MED ORDER — FOSFOMYCIN TROMETHAMINE 3 G PO PACK
3.0000 g | PACK | ORAL | Status: DC
  Filled 2016-11-25: qty 3

## 2016-11-25 MED ORDER — ACETAMINOPHEN 650 MG RE SUPP: 650.0000 mg | Freq: Four times a day (QID) | RECTAL | Status: DC | PRN

## 2016-11-25 MED ORDER — CYCLOSPORINE 0.05 % OP EMUL
1.0000 [drp] | Freq: Two times a day (BID) | OPHTHALMIC | Status: DC
  Administered 2016-11-25 – 2016-11-28 (×6): 1 [drp] via OPHTHALMIC
  Filled 2016-11-25 (×6): qty 1

## 2016-11-25 MED ORDER — FOSFOMYCIN TROMETHAMINE 3 G PO PACK
3.0000 g | PACK | Freq: Once | ORAL | Status: AC
  Administered 2016-11-25: 3 g via ORAL
  Filled 2016-11-25 (×2): qty 3

## 2016-11-25 MED ORDER — FOLIC ACID 1 MG PO TABS
1.0000 mg | ORAL_TABLET | Freq: Every day | ORAL | Status: DC
  Administered 2016-11-25 – 2016-11-28 (×4): 1 mg via ORAL
  Filled 2016-11-25 (×4): qty 1

## 2016-11-25 MED ORDER — PAROXETINE HCL 20 MG PO TABS
40.0000 mg | ORAL_TABLET | Freq: Every day | ORAL | Status: DC
  Administered 2016-11-25 – 2016-11-28 (×4): 40 mg via ORAL
  Filled 2016-11-25 (×4): qty 2

## 2016-11-25 MED ORDER — ALPRAZOLAM 0.5 MG PO TABS
0.5000 mg | ORAL_TABLET | Freq: Three times a day (TID) | ORAL | Status: DC | PRN
  Administered 2016-11-25 – 2016-11-28 (×5): 0.5 mg via ORAL
  Filled 2016-11-25 (×5): qty 1

## 2016-11-25 MED ORDER — HEPARIN SODIUM (PORCINE) 5000 UNIT/ML IJ SOLN
5000.0000 [IU] | Freq: Three times a day (TID) | INTRAMUSCULAR | Status: DC
  Administered 2016-11-25 – 2016-11-28 (×7): 5000 [IU] via SUBCUTANEOUS
  Filled 2016-11-25 (×7): qty 1

## 2016-11-25 MED ORDER — ONDANSETRON HCL 4 MG/2ML IJ SOLN
4.0000 mg | Freq: Four times a day (QID) | INTRAMUSCULAR | Status: DC | PRN
  Administered 2016-11-26 – 2016-11-28 (×4): 4 mg via INTRAVENOUS
  Filled 2016-11-25 (×5): qty 2

## 2016-11-25 NOTE — ED Notes (Signed)
Called [facility] to bring Fosfomycin

## 2016-11-25 NOTE — ED Triage Notes (Signed)
Pt reports taking Xanax for 15 years, states PCP lowered her dose but it was not working for her. Last took Xanax on 11/14/16. Endorses AVH, N/, D/. Denies SI/HI, unsure of what voices tell her. Also states L knee swelling after fall.

## 2016-11-25 NOTE — ED Notes (Signed)
Pt called out at this time stating she saw a "big black rat", no rat found. MD [facility] notified.

## 2016-11-25 NOTE — H&P (Signed)
Patient Demographics:    Robin Blackburn, is a 71 y.o. female  MRN: [id number]   DOB - 03-17-1946  Admit Date - 11/25/2016  Outpatient Primary MD for the patient is Redmond School, MD   Assessment & Plan:    Principal Problem:   Confusion associated with infection (UTI) Active Problems:   Essential hypertension   H/O: CVA (cerebrovascular accident)   UTI (urinary tract infection)    1)UTI - Patient has some confusional issues, does not meet sepsis criteria, due to multiple antibiotic allergies ED provider after discussion with pharmacist gave 1 dose of fosfomycin 3 g x 1 in the ED. White count is less than 11,000, no fevers. Continue fosfomycin pending urine culture results  2)Anxiety- apparently was and now with some confusional episodes since tapering off Xanax by PCP, urine tox screen is still positive for benzos, continue Xanax at reduced dose of 0.5 mg up to 3 times a day. Increase BuSpar to 10 mg 3 times a day, okay to use when necessary IM Haldol for agitation. Continue Paxil 40 daily. Trazodone 100 mg for sleep. No suicidal or homicidal ideation or plan. Consider psychiatric consultation if confusion and anxiety issues worsens.   3)Diarrhea- patient apparently has irritable bowel syndrome with predominant diarrhea, however patient states frequency and volume of stool has gotten worse over the last week. Low clinical index of suspicion for C. Difficile, C. difficile test pending. Patient uses Imodium when necessary at home, okay to continue prn Imodium if C. difficile is negative  4)HTN- stable, restart metoprolol  5)HypoNatremia and Hypokalemia- suspect some degree of dehydration secondary to diarrhea as above a #3, stop Aldactone, give IV fluids and kcl. Repeat BMP in a.m.  6)Rt Knee Pain/Swelling- status post  fall at home, right knee x-rays without acute fractures but patient does have some effusion, ice prn, physical therapy consult pending  7)H/o Rheumatoid arthritis- continue Plaquenil  8)H/o Prior CVA- no acute neuro deficits at this time, aspirin and Lipitor as ordered  9)Confusion- most likely secondary to #1 above compounded by #2 above.   With History of - Reviewed by me  Past Medical History:  Diagnosis Date  . Acid reflux   . Anxiety   . 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 Depression   . Fibromyalgia   . Hypertension   . Neuropathy   . Rheumatoid arthritis ([place])   . Stroke [facility]) 02/17/14      Past Surgical History:  Procedure Laterality Date  . ABDOMINAL HYSTERECTOMY    . ANKLE RECONSTRUCTION    . APPENDECTOMY    . BACK SURGERY    . CHOLECYSTECTOMY    . KNEE SURGERY        Chief Complaint  Patient presents with  . Withdrawal      HPI:    Robin Blackburn  is a 71 y.o. female, With past medical history relevant for anxiety disorder, rheumatoid arthritis and  IBS with predominant diarrhea who presents with concerns of increased frequency and volume of stools, increased anxiety and restlessness associated with confusional episodes since the PCP started tapering down dose of Xanax a couple weeks ago. Denies suicidal or homicidal ideation. No fever  Or chills , patient had persistent nausea and one episode of emesis without blood or bile. Diarrhea has been without blood or mucus. No sick contacts at home  In ED.... Workup suggests UTI and well as hyponatremia and hypokalemia, patient received 1 dose of fosfomycin from ED physician due to multiple antibiotic allergies.  Patient also reports one episode of fall a few days ago with right knee pain,   Patient has a remote history of CVA, without any residual deficits, she denies new focal neuro deficits    Review of systems:    In addition to the HPI above,   A full 12 point Review of 10 Systems  was done, except as stated above, all other Review of 10 Systems were negative.    Social History:  Reviewed by me    Social History  Substance Use Topics  . Smoking status: Never Smoker  . Smokeless tobacco: Never Used  . Alcohol use No       Family History :  Reviewed by me    Family History  Problem Relation Age of Onset  . Hypertension Father   . Transient ischemic attack Father   . Hypertension Brother   . CVA Maternal Grandfather   . Leukemia Brother      Home Medications:   Prior to Admission medications   Medication Sig Start Date End Date Taking? Authorizing Provider  acetaminophen (TYLENOL) 325 MG tablet Take 650 mg by mouth daily. May give a dose of Hydrocodone/APAP 7.5/5 prn along with this if needed   Yes [provider]  ALPRAZolam Duanne Moron) 1 MG tablet Take 1 tablet (1 mg total) by mouth 3 (three) times daily as needed for anxiety. Take one tablet three times daily. Monitor for frequent complaints, frequent movements SOB document behavior 04/04/16  Yes Elmahi, Rae Lips, MD  busPIRone (BUSPAR) 5 MG tablet Take 5 mg by mouth 2 (two) times daily.   Yes [provider]  Cholecalciferol 5000 units TABS Will take 5,000 unit along with 2,000 unit daily for a 7,000 unit daily   Yes [provider]  desloratadine (CLARINEX) 5 MG tablet Take 5 mg by mouth daily.   Yes [provider]  dicyclomine (BENTYL) 20 MG tablet Take 20 mg by mouth every 6 (six) hours as needed. Abdominal cramps 01/31/14  Yes [provider]  docusate sodium (COLACE) 100 MG capsule Take 100 mg by mouth 2 (two) times daily as needed for mild constipation.    Yes [provider]  HYDROcodone-acetaminophen (NORCO) 7.5-325 MG tablet Take 1 tablet by mouth every 6 (six) hours as needed. 04/04/16  Yes Verlee Monte, MD  hydroxychloroquine (PLAQUENIL) 200 MG tablet TAKE ONE TABLET BY MOUTH ONCE DAILY. 10/16/16  Yes Panwala, Naitik, PA-C  lansoprazole  (PREVACID) 30 MG capsule Take 30 mg by mouth 2 (two) times daily. 02/17/14  Yes [provider]  loperamide (IMODIUM A-D) 2 MG tablet Take 2 mg by mouth 4 (four) times daily as needed for diarrhea or loose stools.   Yes [provider]  methocarbamol (ROBAXIN) 750 MG tablet Take 375-750 mg by mouth every 6 (six) hours as needed for muscle spasms.    Yes [provider]  metoprolol tartrate (LOPRESSOR) 25 MG  tablet Take 1 tablet (25 mg total) by mouth 2 (two) times daily. 09/28/15  Yes Hendricks Limes, MD  ondansetron (ZOFRAN) 4 MG tablet Take 4 mg by mouth every 6 (six) hours as needed. Give with plaquenil daily at 10 am. Give 4 mg as needed for N / V   Yes [provider]  PARoxetine (PAXIL) 40 MG tablet Take 1 tablet by mouth daily. 12/14/15  Yes [provider]  RESTASIS 0.05 % ophthalmic emulsion Place 1 drop into both eyes 2 (two) times daily. 01/31/14  Yes [provider]  Simethicone 125 MG TABS Chew 125 mg by mouth at bedtime. And as needed.   Yes [provider]  sodium chloride (OCEAN) 0.65 % SOLN nasal spray Place 1 spray into both nostrils as needed for congestion. 02/22/14  Yes Rexene Alberts, MD  traZODone (DESYREL) 50 MG tablet Take 50 mg by mouth at bedtime.   Yes [provider]  clopidogrel (PLAVIX) 75 MG tablet Take 75 mg by mouth at bedtime. 05/03/14   [provider]  METHYL SALICYLATE-LIDO-MENTHOL EX Arthritis hot pain relief apply thin layer 15-10 % three times a to left leg as needed    [provider]  nystatin (MYCOSTATIN/NYSTOP) powder Apply topically 3 (three) times daily. Patient not taking: Reported on 11/25/2016 04/04/16   Verlee Monte, MD  Propylene Glycol 0.6 % SOLN Place 1 drop into both eyes daily. At noon.    [provider]  spironolactone (ALDACTONE) 25 MG tablet Take 25 mg by mouth daily.    [provider]  zolpidem (AMBIEN CR) 12.5 MG CR tablet Take 1 tablet  (12.5 mg total) by mouth at bedtime as needed for sleep. Patient not taking: Reported on 11/25/2016 10/18/15   Hollace Kinnier L, DO     Allergies:     Allergies  Allergen Reactions  . Demerol [Meperidine] Anaphylaxis  . Hydromorphone Anaphylaxis  . Penicillins Anaphylaxis    Has patient had a PCN reaction causing immediate rash, facial/tongue/throat swelling, SOB or lightheadedness with hypotension: Yes Has patient had a PCN reaction causing severe rash involving mucus membranes or skin necrosis: No Has patient had a PCN reaction that required hospitalization No Has patient had a PCN reaction occurring within the last 10 years: No If all of the above answers are "NO", then may proceed with Cephalosporin use.   . Sulfa Antibiotics Nausea And Vomiting  . Bisphosphonates     GI intolerance  . Fentanyl Other (See Comments)    "felt like I had demons in my head"  . Iohexol   . Percocet [Oxycodone-Acetaminophen] Swelling    Mouth swelling.  . Shellfish Allergy     Glucosamine not an option  . Terramycin [Oxytetracycline] Nausea And Vomiting  . Ciprofloxacin Nausea Only and Rash  . Levaquin [Levofloxacin In D5w] Nausea And Vomiting and Rash  . Morphine And Related Rash     Physical Exam:   Vitals  Blood pressure (!) 135/93, pulse 78, temperature 98.3 F (36.8 C), temperature source Oral, resp. rate (!) 24, height 5\' 2"  (1.575 m), weight 93 kg (205 lb), SpO2 98 %.  Physical Examination: General appearance - alert, well appearing, and in no distress  Mental status - alert, oriented to person, place, and time, intermittent episodes of confusion Eyes - sclera anicteric Neck - supple, no JVD elevation , Chest - clear  to auscultation bilaterally, symmetrical air movement,  Heart - S1 and S2 normal,  Abdomen - soft, nontender, nondistended,  no CVA Tenderness Neurological - screening mental status exam normal, neck supple without rigidity, cranial nerves II through XII intact, DTR's  normal and symmetric Extremities -   intact peripheral pulses , Rt Knee swelling/tenderness Skin - warm, dry    Data Review:    CBC  Recent Labs Lab 11/25/16 1405  WBC 10.6*  HGB 14.7  HCT 44.7  PLT 475*  MCV 92.9  MCH 30.6  MCHC 32.9  RDW 13.4  LYMPHSABS 2.8  MONOABS 0.7  EOSABS 0.0  BASOSABS 0.0   ------------------------------------------------------------------------------------------------------------------  Chemistries   Recent Labs Lab 11/25/16 1405  NA 132*  K 3.4*  CL 94*  CO2 25  GLUCOSE 128*  BUN 5*  CREATININE 0.82  CALCIUM 8.5*  AST 29  ALT 15  ALKPHOS 90  BILITOT 0.8   ------------------------------------------------------------------------------------------------------------------ estimated creatinine clearance is 66.9 mL/min (by C-G formula based on SCr of 0.82 mg/dL). ------------------------------------------------------------------------------------------------------------------ No results for input(s): TSH, T4TOTAL, T3FREE, THYROIDAB in the last 72 hours.  Invalid input(s): FREET3   Coagulation profile No results for input(s): INR, PROTIME in the last 168 hours. ------------------------------------------------------------------------------------------------------------------- No results for input(s): DDIMER in the last 72 hours. -------------------------------------------------------------------------------------------------------------------  Cardiac Enzymes No results for input(s): CKMB, TROPONINI, MYOGLOBIN in the last 168 hours.  Invalid input(s): CK ------------------------------------------------------------------------------------------------------------------    Component Value Date/Time   BNP 47.0 03/15/2015 0800     ---------------------------------------------------------------------------------------------------------------  Urinalysis    Component Value Date/Time   COLORURINE AMBER (A) 11/25/2016 1630    APPEARANCEUR HAZY (A) 11/25/2016 1630   LABSPEC 1.021 11/25/2016 1630   PHURINE 5.0 11/25/2016 1630   GLUCOSEU NEGATIVE 11/25/2016 1630   HGBUR MODERATE (A) 11/25/2016 1630   BILIRUBINUR NEGATIVE 11/25/2016 1630   KETONESUR NEGATIVE 11/25/2016 1630   PROTEINUR 30 (A) 11/25/2016 1630   UROBILINOGEN 0.2 04/10/2015 2212   NITRITE NEGATIVE 11/25/2016 1630   LEUKOCYTESUR MODERATE (A) 11/25/2016 1630    ----------------------------------------------------------------------------------------------------------------   Imaging Results:    Dg Knee Complete 4 Views Right  Result Date: 11/25/2016 CLINICAL DATA:  RIGHT KNEE PAIN S/P RIGHT LEG BEING BENT BACK UNDER HER WHILE EMS WAS TRANSPORTING HER EXAM: RIGHT KNEE - COMPLETE 4+ VIEW COMPARISON:  05/24/2014 FINDINGS: Mild osteopenia. 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 Small to moderate suprapatellar joint effusion is decreased in size since the prior. No acute fracture or dislocation. 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 Electronically Signed   By: Abigail Miyamoto M.D.   On: 11/25/2016 15:48    Radiological Exams on Admission: Dg Knee Complete 4 Views Right  Result Date: 11/25/2016 CLINICAL DATA:  RIGHT KNEE PAIN S/P RIGHT LEG BEING BENT BACK UNDER HER WHILE EMS WAS TRANSPORTING HER EXAM: RIGHT KNEE - COMPLETE 4+ VIEW COMPARISON:  05/24/2014 FINDINGS: Mild osteopenia. 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 Small to moderate suprapatellar joint effusion is decreased in size since the prior. No acute fracture or dislocation. 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 Electronically Signed   By: Abigail Miyamoto M.D.   On: 11/25/2016 15:48    DVT Prophylaxis -SCD   AM Labs Ordered, also please review Full Orders  Family Communication: Admission, patients condition and plan of care including tests being ordered have been discussed with  the patient who indicate understanding and agree with the plan   Code Status - Full Code  Likely DC to  home  Condition   stable  Denton Brick, Ferguson Gertner M.D on 11/25/2016 at 6:37 PM   Between  7am to 7pm - Pager - [phone]  After 7pm go to [email or web address] - password [email or web address]  [facility] - Office  [phone]  Voice Recognition Viviann Spare dictation system was used to create this note, attempts have been made to correct errors. Please contact the author with questions and/or clarifications.

## 2016-11-25 NOTE — ED Notes (Signed)
Pt to xray

## 2016-11-25 NOTE — ED Provider Notes (Signed)
[facility] DEPT Provider Note   CSN: [id number] Arrival date & time: 11/25/16  1326     History   Chief Complaint Chief Complaint  Patient presents with  . Withdrawal    HPI Robin Blackburn is a 71 y.o. female.  HPI Patient with history of anxiety and chronic benzodiazepine use and states she took her last Xanax roughly 2 weeks ago. She's had some mild tremor since that time. She also states she's had nausea and several loose stools. Denies any chest pain, shortness of breath or abdominal pain. Has appointment to follow-up with her primary physician. States she has not been taking her BuSpar as previously prescribed. Past Medical History:  Diagnosis Date  . Acid reflux   . Anxiety   . 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 Depression   . Fibromyalgia   . Hypertension   . Neuropathy   . Rheumatoid arthritis ([place])   . Stroke [facility]) 02/17/14    Patient Active Problem List   Diagnosis Date Noted  . Acute lower UTI 11/25/2016  . Confusion associated with infection (UTI) 11/25/2016  . Urinary tract infection without hematuria 04/02/2016  . UTI (urinary tract infection) 04/02/2016  . Benzodiazepine withdrawal with perceptual disturbance ([place]) 04/02/2016  . Acute pain of left knee 12/18/2015  . Osteoporosis 11/01/2015  . Vitamin D deficiency 10/11/2015  . History of fracture of left ankle 09/28/2015  . Chronic pain in left foot 09/28/2015  . Fibromyalgia 09/28/2015  . Rash and nonspecific skin eruption 07/17/2015  . Cerumen impaction 05/30/2015  . Edema 04/16/2015  . Cellulitis 04/16/2015  . Mouth pain 04/08/2015  . Pain in joint, lower leg 04/08/2015  . GERD (gastroesophageal reflux disease) 04/05/2015  . Occult blood positive stool 01/17/2015  . Diarrhea 01/05/2015  . Chronic pain syndrome 12/02/2014  . Pain in joint, upper arm 10/29/2014  . Nasal congestion 10/29/2014  . Allergic rhinitis 08/20/2014  . Conjunctivitis 08/08/2014  . Dysuria  06/27/2014  . Back pain, lumbosacral 06/23/2014  . Sinusitis, chronic 06/17/2014  . Fall 05/24/2014  . H/O: CVA (cerebrovascular accident) 05/24/2014  . HLD (hyperlipidemia) 05/24/2014  . Physical deconditioning 05/24/2014  . Fracture of fifth metatarsal bone of right foot 03/06/2014  . Right sided weakness 02/26/2014  . Leukocytosis 02/20/2014  . Rheumatoid arthritis ([place]) 02/20/2014  . CVA (cerebral infarction) 02/19/2014  . Right ankle sprain 02/19/2014  . Contusion of right foot 02/19/2014  . Essential hypertension 02/19/2014  . Neuropathy 02/19/2014  . Anxiety 02/19/2014  . Hyponatremia 02/19/2014  . Hypokalemia 02/19/2014    Past Surgical History:  Procedure Laterality Date  . ABDOMINAL HYSTERECTOMY    . ANKLE RECONSTRUCTION    . APPENDECTOMY    . BACK SURGERY    . CHOLECYSTECTOMY    . KNEE SURGERY      OB History    No data available       Home Medications    Prior to Admission medications   Medication Sig Start Date End Date Taking? Authorizing Provider  acetaminophen (TYLENOL) 325 MG tablet Take 650 mg by mouth daily. May give a dose of Hydrocodone/APAP 7.5/5 prn along with this if needed   Yes [provider]  ALPRAZolam Duanne Moron) 1 MG tablet Take 1 tablet (1 mg total) by mouth 3 (three) times daily as needed for anxiety. Take one tablet three times daily. Monitor for frequent complaints, frequent movements SOB document behavior 04/04/16  Yes Verlee Monte, MD  busPIRone (BUSPAR) 5 MG  tablet Take 5 mg by mouth 2 (two) times daily.   Yes [provider]  Cholecalciferol 5000 units TABS Will take 5,000 unit along with 2,000 unit daily for a 7,000 unit daily   Yes [provider]  desloratadine (CLARINEX) 5 MG tablet Take 5 mg by mouth daily.   Yes [provider]  dicyclomine (BENTYL) 20 MG tablet Take 20 mg by mouth every 6 (six) hours as needed. Abdominal cramps 01/31/14  Yes [provider]  docusate sodium (COLACE)  100 MG capsule Take 100 mg by mouth 2 (two) times daily as needed for mild constipation.    Yes [provider]  HYDROcodone-acetaminophen (NORCO) 7.5-325 MG tablet Take 1 tablet by mouth every 6 (six) hours as needed. 04/04/16  Yes Verlee Monte, MD  hydroxychloroquine (PLAQUENIL) 200 MG tablet TAKE ONE TABLET BY MOUTH ONCE DAILY. 10/16/16  Yes Panwala, Naitik, PA-C  lansoprazole (PREVACID) 30 MG capsule Take 30 mg by mouth 2 (two) times daily. 02/17/14  Yes [provider]  loperamide (IMODIUM A-D) 2 MG tablet Take 2 mg by mouth 4 (four) times daily as needed for diarrhea or loose stools.   Yes [provider]  methocarbamol (ROBAXIN) 750 MG tablet Take 375-750 mg by mouth every 6 (six) hours as needed for muscle spasms.    Yes [provider]  metoprolol tartrate (LOPRESSOR) 25 MG tablet Take 1 tablet (25 mg total) by mouth 2 (two) times daily. 09/28/15  Yes Hendricks Limes, MD  ondansetron (ZOFRAN) 4 MG tablet Take 4 mg by mouth every 6 (six) hours as needed. Give with plaquenil daily at 10 am. Give 4 mg as needed for N / V   Yes [provider]  PARoxetine (PAXIL) 40 MG tablet Take 1 tablet by mouth daily. 12/14/15  Yes [provider]  RESTASIS 0.05 % ophthalmic emulsion Place 1 drop into both eyes 2 (two) times daily. 01/31/14  Yes [provider]  Simethicone 125 MG TABS Chew 125 mg by mouth at bedtime. And as needed.   Yes [provider]  sodium chloride (OCEAN) 0.65 % SOLN nasal spray Place 1 spray into both nostrils as needed for congestion. 02/22/14  Yes Rexene Alberts, MD  traZODone (DESYREL) 50 MG tablet Take 50 mg by mouth at bedtime.   Yes [provider]  clopidogrel (PLAVIX) 75 MG tablet Take 75 mg by mouth at bedtime. 05/03/14   [provider]  METHYL SALICYLATE-LIDO-MENTHOL EX Arthritis hot pain relief apply thin layer 15-10 % three times a to left leg as needed    [provider]    nystatin (MYCOSTATIN/NYSTOP) powder Apply topically 3 (three) times daily. Patient not taking: Reported on 11/25/2016 04/04/16   Verlee Monte, MD  Propylene Glycol 0.6 % SOLN Place 1 drop into both eyes daily. At noon.    [provider]  spironolactone (ALDACTONE) 25 MG tablet Take 25 mg by mouth daily.    [provider]  zolpidem (AMBIEN CR) 12.5 MG CR tablet Take 1 tablet (12.5 mg total) by mouth at bedtime as needed for sleep. Patient not taking: Reported on 11/25/2016 10/18/15   Gayland Curry, DO    Family History Family History  Problem Relation Age of Onset  . Hypertension Father   . Transient ischemic attack Father   . Hypertension Brother   . CVA Maternal Grandfather   . Leukemia Brother     Social History Social History  Substance Use Topics  .  Smoking status: Never Smoker  . Smokeless tobacco: Never Used  . Alcohol use No     Allergies   Demerol [meperidine]; Hydromorphone; Penicillins; Sulfa antibiotics; Bisphosphonates; Fentanyl; Iohexol; Percocet [oxycodone-acetaminophen]; Shellfish allergy; Terramycin [oxytetracycline]; Ciprofloxacin; Levaquin [levofloxacin in d5w]; and Morphine and related   Review of Systems Review of Systems  Constitutional: Negative for chills, fatigue and fever.  Respiratory: Negative for cough and shortness of breath.   Cardiovascular: Negative for chest pain.  Gastrointestinal: Positive for diarrhea, nausea and vomiting. Negative for abdominal pain and constipation.  Genitourinary: Negative for dysuria, flank pain and frequency.  Musculoskeletal: Negative for back pain, neck pain and neck stiffness.  Skin: Negative for rash and wound.  Neurological: Positive for tremors. Negative for dizziness, weakness, light-headedness, numbness and headaches.  Psychiatric/Behavioral: Negative for suicidal ideas. The patient is nervous/anxious.   All other systems reviewed and are negative.    Physical Exam Updated Vital  Signs BP (!) 135/93 (BP Location: Left Arm)   Pulse 78   Temp 98.3 F (36.8 C) (Oral)   Resp (!) 24   Ht 5\' 2"  (1.575 m)   Wt 93 kg (205 lb)   SpO2 98%   BMI 37.49 kg/m   Physical Exam  Constitutional: She is oriented to person, place, and time. She appears well-developed and well-nourished. No distress.  HENT:  Head: Normocephalic and atraumatic.  Mouth/Throat: Oropharynx is clear and moist. No oropharyngeal exudate.  Eyes: EOM are normal. Pupils are equal, round, and reactive to light.  Neck: Normal range of motion. Neck supple.  No meningismus  Cardiovascular: Normal rate and regular rhythm.  Exam reveals no gallop and no friction rub.   No murmur heard. Pulmonary/Chest: Effort normal and breath sounds normal.  Abdominal: Soft. Bowel sounds are normal. There is no tenderness. There is no rebound and no guarding.  Musculoskeletal: Normal range of motion. She exhibits no edema or tenderness.  Neurological: She is alert and oriented to person, place, and time.  5/5 motor in all extremity is. Sensation fully intact.  Skin: Skin is warm and dry. No rash noted. No erythema.  Psychiatric: She has a normal mood and affect. Her behavior is normal.  Does not appear to be responding to internal stimuli. Mild anxiety but no agitation. Mentating well.  Nursing note and vitals reviewed.    ED Treatments / Results  Labs (all labs ordered are listed, but only abnormal results are displayed) Labs Reviewed  CBC WITH DIFFERENTIAL/PLATELET - Abnormal; Notable for the following:       Result Value   WBC 10.6 (*)    Platelets 475 (*)    All other components within normal limits  COMPREHENSIVE METABOLIC PANEL - Abnormal; Notable for the following:    Sodium 132 (*)    Potassium 3.4 (*)    Chloride 94 (*)    Glucose, Bld 128 (*)    BUN 5 (*)    Calcium 8.5 (*)    Albumin 3.2 (*)    All other components within normal limits  RAPID URINE DRUG SCREEN, HOSP PERFORMED - Abnormal; Notable  for the following:    Benzodiazepines POSITIVE (*)    All other components within normal limits  URINALYSIS, ROUTINE W REFLEX MICROSCOPIC - Abnormal; Notable for the following:    Color, Urine AMBER (*)    APPearance HAZY (*)    Hgb urine dipstick MODERATE (*)    Protein, ur 30 (*)    Leukocytes, UA MODERATE (*)    Bacteria, UA  MANY (*)    Squamous Epithelial / LPF 0-5 (*)    All other components within normal limits  URINE CULTURE  ETHANOL    EKG  EKG Interpretation None       Radiology Dg Knee Complete 4 Views Right  Result Date: 11/25/2016 CLINICAL DATA:  RIGHT KNEE PAIN S/P RIGHT LEG BEING BENT BACK UNDER HER WHILE EMS WAS TRANSPORTING HER EXAM: RIGHT KNEE - COMPLETE 4+ VIEW COMPARISON:  05/24/2014 FINDINGS: Mild osteopenia. 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 Small to moderate suprapatellar joint effusion is decreased in size since the prior. No acute fracture or dislocation. 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 Electronically Signed   By: Abigail Miyamoto M.D.   On: 11/25/2016 15:48    Procedures Procedures (including critical care time)  Medications Ordered in ED Medications  ondansetron (ZOFRAN) injection 4 mg (not administered)  fosfomycin (MONUROL) packet 3 g (3 g Oral Given 11/25/16 1751)     Initial Impression / Assessment and Plan / ED Course  I have reviewed the triage vital signs and the nursing notes.  Pertinent labs & imaging results that were available during my care of the patient were reviewed by me and considered in my medical decision making (see chart for details).    Patient states she sitting large rents in the room. None present. She does have evidence of urinary tract infection. Discussed with pharmacist given her multiple allergies to antibiotics. Will give fosfomycin. Altered mental status likely due to urinary tract infection though there may be component of withdrawal saying that  her UDS is positive for benzodiazepines. Discussed with hospitalist and will admit.   Final Clinical Impressions(s) / ED Diagnoses   Final diagnoses:  Acute lower UTI  Visual hallucinations    New Prescriptions New Prescriptions   No medications on file     Julianne Rice, MD 11/25/16 [phone]

## 2016-11-26 DIAGNOSIS — N390 Urinary tract infection, site not specified: Principal | ICD-10-CM

## 2016-11-26 DIAGNOSIS — I10 Essential (primary) hypertension: Secondary | ICD-10-CM

## 2016-11-26 DIAGNOSIS — B999 Unspecified infectious disease: Secondary | ICD-10-CM

## 2016-11-26 DIAGNOSIS — R410 Disorientation, unspecified: Secondary | ICD-10-CM

## 2016-11-26 DIAGNOSIS — G8929 Other chronic pain: Secondary | ICD-10-CM

## 2016-11-26 DIAGNOSIS — M25561 Pain in right knee: Secondary | ICD-10-CM

## 2016-11-26 LAB — BASIC METABOLIC PANEL
Anion gap: 8 (ref 5–15)
BUN: 7 mg/dL (ref 6–20)
CALCIUM: 8.1 mg/dL — AB (ref 8.9–10.3)
CHLORIDE: 103 mmol/L (ref 101–111)
CO2: 25 mmol/L (ref 22–32)
CREATININE: 0.73 mg/dL (ref 0.44–1.00)
GFR calc Af Amer: >60 mL/min (ref >60)
GFR calc non Af Amer: >60 mL/min (ref >60)
GLUCOSE: 136 mg/dL — AB (ref 65–99)
Potassium: 3.6 mmol/L (ref 3.5–5.1)
Sodium: 136 mmol/L (ref 135–145)

## 2016-11-26 LAB — CBC
HEMATOCRIT: 41.4 % (ref 36.0–46.0)
HEMOGLOBIN: 13.7 g/dL (ref 12.0–15.0)
MCH: 30.4 pg (ref 26.0–34.0)
MCHC: 33.1 g/dL (ref 30.0–36.0)
MCV: 92 fL (ref 78.0–100.0)
Platelets: 410 10*3/uL — ABNORMAL HIGH (ref 150–400)
RBC: 4.5 MIL/uL (ref 3.87–5.11)
RDW: 13.2 % (ref 11.5–15.5)
WBC: 12.5 10*3/uL — ABNORMAL HIGH (ref 4.0–10.5)

## 2016-11-26 MED ORDER — DICLOFENAC SODIUM 1 % TD GEL
2.0000 g | Freq: Four times a day (QID) | TRANSDERMAL | Status: DC
  Administered 2016-11-26 – 2016-11-28 (×6): 2 g via TOPICAL
  Filled 2016-11-26: qty 100

## 2016-11-26 MED ORDER — DEXTROSE 5 % IV SOLN
1.0000 g | INTRAVENOUS | Status: DC
Start: 2016-11-26 — End: 2016-11-28
  Administered 2016-11-26 – 2016-11-28 (×3): 1 g via INTRAVENOUS
  Filled 2016-11-26 (×6): qty 10

## 2016-11-26 MED ORDER — HALOPERIDOL LACTATE 5 MG/ML IJ SOLN: 1.0000 mg | Freq: Four times a day (QID) | INTRAMUSCULAR | Status: DC | PRN

## 2016-11-26 MED ORDER — SODIUM CHLORIDE 0.9 % IV SOLN
INTRAVENOUS | Status: DC
  Administered 2016-11-26 – 2016-11-28 (×3): via INTRAVENOUS

## 2016-11-26 MED ORDER — IBUPROFEN 400 MG PO TABS
200.0000 mg | ORAL_TABLET | Freq: Three times a day (TID) | ORAL | Status: DC
  Administered 2016-11-26 – 2016-11-28 (×8): 200 mg via ORAL
  Filled 2016-11-26 (×8): qty 1

## 2016-11-26 NOTE — Progress Notes (Signed)
PROGRESS NOTE    Robin Blackburn  FFM:[id number] DOB: 1945-12-04 DOA: 11/25/2016 PCP: Redmond School, MD    Brief Narrative:  71 year old female who presented to hospital with withdrawal symptoms. She is known to have anxiety disorder, rheumatoid arthritis, irritable bowel syndrome. For last 2 weeks she has been instructed to taper down her alprazolam, she has developed increased anxiety, restlessness and confusion. No fevers or chills. On initial physical examination blood pressure 135/93, heart rate 78, temperature 98.3, respiratory rate 24 oxygen saturation 98%, positive intermittent episodes of confusion, lungs were clear to auscultation bilaterally, heart S1-S2 present rhythmic, abdomen soft nontender, no lower extremity edema. Sodium 132, potassium 3.4, chloride 94, bicarbonate 25, glucose 128, BUN 5, creatinine 0.820 white count 10.6, hemoglobin 14.7, hematocrit 44.7, platelets 475, urinalysis had too numerous count white cells, specific gravity 1.021 alcohol less than 5, UDS positive for benzodiazepines. Patient was admitted to the hospital with the working diagnosis of metabolic encephalopathy due to urinary tract infection, complicated by hyponatremia and hypokalemia.   Assessment & Plan:   Principal Problem:   Confusion associated with infection (UTI) Active Problems:   Essential hypertension   H/O: CVA (cerebrovascular accident)   Acute pain of left knee   UTI (urinary tract infection)   1. Metabolic encephalopathy. Patient with persistent confusion to time and situation. Will continue supportive medical care with IV antibiotics and IV fluids, neuro checks per unit protocol, physical therapy evaluation and out of bed as tolerated tid with meals. Will continue psychotropic medications.    2. Urine tract infection (present on admission). Old records reviewed per pharmacy, patient has received cephalosporins in the past, will dc fosfomycin and will start IV ceftriaxone, old cultures  personally reviewed noted dipththeroids and e coli. Will continue to follow cell count, temperature curve and cultures.   3. HTN. Will continue metoprolol for blood pressure control, systolic 570. Will continue hydration with isotonic saline.   4. HypoNatremia and Hypokalemia. Due to dehydration, will continue with IV fluids, will change from normal saline with kcl to saline alone to avoid hyperchloremia and acidosis. Follow on renal panel in am, renal function with cr at 0.73, with K at 3,6 and Na at 136.   5. Anxiety. Will continue alprazolam, buspirone, paroxetine, trazodone and, as needed haldol.   6. Right knee pain. No signs of deep infection, likely related to osteoarthritis, will add low dose ibuprofen and topical voltaren, may need local steroid injection. Out of bed as tolerated and physical therapy evaluation. GI prophylaxis.      DVT prophylaxis: scd Code Status: full Family Communication:  Disposition Plan:    Consultants:     Procedures:   Antimicrobials:    Subjective: Patient still confused and disorientated, no nausea or vomiting, no chest pain or dyspnea. Positive dysuria and lower abdominal pain. Positive right knee pain, moderate in intensity, worse to touch, associated with ambulatory dysfunction.   Objective: Vitals:   11/25/16 1754 11/25/16 1836 11/25/16 1917 11/26/16 0419  BP: (!) 135/93 (!) 149/76 (!) 144/78 128/80  Pulse: 78 75 78 90  Resp: (!) 24 (!) 22 20 20   Temp:   99.2 F (37.3 C) 98.8 F (37.1 C)  TempSrc:   Oral Oral  SpO2: 98% 97% 99% 97%  Weight:   90.1 kg (198 lb 11.2 oz)   Height:   5\' 6"  (1.676 m)     Intake/Output Summary (Last 24 hours) at 11/26/16 0854 Last data filed at 11/26/16 0335  Gross per 24 hour  Intake           588.33 ml  Output                0 ml  Net           588.33 ml   Filed Weights   11/25/16 1329 11/25/16 1917  Weight: 93 kg (205 lb) 90.1 kg (198 lb 11.2 oz)    Examination:  General exam:  deconditioned E ENT: mild pallor, oral mucosa moist.  Respiratory system: Clear to auscultation. Respiratory effort normal. No wheezing, rales or rhonchi.  Cardiovascular system: S1 & S2 heard, RRR. No JVD, murmurs, rubs, gallops or clicks. No pedal edema. Gastrointestinal system: Abdomen is nondistended, soft and nontender. No organomegaly or masses felt. Normal bowel sounds heard. Central nervous system: Alert and oriented. No focal neurological deficits. Extremities: Symmetric 5 x 5 power. Right knee with edema, local tenderness and decreased range of motion, no erythema or increased local temperature.  Skin: No rashes, lesions or ulcers     Data Reviewed: I have personally reviewed following labs and imaging studies  CBC:  Recent Labs Lab 11/25/16 1405 11/26/16 0450  WBC 10.6* 12.5*  NEUTROABS 6.9  --   HGB 14.7 13.7  HCT 44.7 41.4  MCV 92.9 92.0  PLT 475* 573*   Basic Metabolic Panel:  Recent Labs Lab 11/25/16 1405 11/26/16 0450  NA 132* 136  K 3.4* 3.6  CL 94* 103  CO2 25 25  GLUCOSE 128* 136*  BUN 5* 7  CREATININE 0.82 0.73  CALCIUM 8.5* 8.1*   GFR: Estimated Creatinine Clearance: 72.9 mL/min (by C-G formula based on SCr of 0.73 mg/dL). Liver Function Tests:  Recent Labs Lab 11/25/16 1405  AST 29  ALT 15  ALKPHOS 90  BILITOT 0.8  PROT 6.8  ALBUMIN 3.2*   No results for input(s): LIPASE, AMYLASE in the last 168 hours. No results for input(s): AMMONIA in the last 168 hours. Coagulation Profile: No results for input(s): INR, PROTIME in the last 168 hours. Cardiac Enzymes: No results for input(s): CKTOTAL, CKMB, CKMBINDEX, TROPONINI in the last 168 hours. BNP (last 3 results) No results for input(s): PROBNP in the last 8760 hours. HbA1C: No results for input(s): HGBA1C in the last 72 hours. CBG: No results for input(s): GLUCAP in the last 168 hours. Lipid Profile: No results for input(s): CHOL, HDL, LDLCALC, TRIG, CHOLHDL, LDLDIRECT in the last  72 hours. Thyroid Function Tests: No results for input(s): TSH, T4TOTAL, FREET4, T3FREE, THYROIDAB in the last 72 hours. Anemia Panel: No results for input(s): VITAMINB12, FOLATE, FERRITIN, TIBC, IRON, RETICCTPCT in the last 72 hours. Sepsis Labs: No results for input(s): PROCALCITON, LATICACIDVEN in the last 168 hours.  No results found for this or any previous visit (from the past 240 hour(s)).       Radiology Studies: Dg Knee Complete 4 Views Right  Result Date: 11/25/2016 CLINICAL DATA:  RIGHT KNEE PAIN S/P RIGHT LEG BEING BENT BACK UNDER HER WHILE EMS WAS TRANSPORTING HER EXAM: RIGHT KNEE - COMPLETE 4+ VIEW COMPARISON:  05/24/2014 FINDINGS: Mild osteopenia. 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 Small to moderate suprapatellar joint effusion is decreased in size since the prior. No acute fracture or dislocation. 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 Electronically Signed   By: Abigail Miyamoto M.D.   On: 11/25/2016 15:48        Scheduled Meds: . aspirin  81 mg Oral Daily  . atorvastatin  10  mg Oral q1800  . busPIRone  10 mg Oral TID  . cycloSPORINE  1 drop Both Eyes BID  . feeding supplement (ENSURE ENLIVE)  237 mL Oral BID BM  . folic acid  1 mg Oral Daily  . fosfomycin  3 g Oral Q72H  . heparin  5,000 Units Subcutaneous Q8H  . hydroxychloroquine  200 mg Oral Daily  . methocarbamol  500 mg Oral TID  . metoprolol tartrate  25 mg Oral BID  . ondansetron  4 mg Oral Daily  . pantoprazole  40 mg Oral Daily  . PARoxetine  40 mg Oral Daily  . simethicone  80 mg Oral QHS  . sodium chloride flush  3 mL Intravenous Q12H  . traZODone  100 mg Oral QHS   Continuous Infusions: . sodium chloride    . 0.9 % NaCl with KCl 40 mEq / L 100 mL/hr (11/25/16 2142)     LOS: 1 day     Zoiey Christy Gerome Apley, MD [facility] Pager [phone]  If 7PM-7AM, please contact night-coverage [email or web address] Password  [facility] 11/26/2016, 8:54 AM

## 2016-11-26 NOTE — Progress Notes (Signed)
Nutrition Brief Note  Patient identified on the Malnutrition Screening Tool (MST) Report  Wt Readings from Last 15 Encounters:  11/25/16 198 lb 11.2 oz (90.1 kg)  04/02/16 204 lb 12.8 oz (92.9 kg)  12/25/15 206 lb 3.2 oz (93.5 kg)  12/18/15 211 lb (95.7 kg)  12/17/15 182 lb (82.6 kg)  11/21/15 211 lb 9.6 oz (96 kg)  11/14/15 211 lb 9.6 oz (96 kg)  10/11/15 215 lb 9.6 oz (97.8 kg)  09/28/15 210 lb (95.3 kg)  09/28/15 210 lb 6.4 oz (95.4 kg)  04/10/15 192 lb (87.1 kg)  05/24/14 185 lb 3 oz (84 kg)  02/19/14 199 lb 15.3 oz (90.7 kg)   Body mass index is 32.07 kg/m. Patient meets criteria for Obese based on current BMI.   Patient had reported on MST that she had lost wt without trying. Noted she is very confused and therefore question accuracy of MST.   RD attempted to speak with patient, but she was confused and perseverating on her brother who she said lost his legs last night and RD was to blame.   Pt well known to RD from her long stay at [facility]. Most of her weights listed above were from that time. She was receiving 3 large meals a day with any additional foods she desired and goes to follow she lost a slight amount of wt once left. Patients weight from her last 2 admissions were 10/24:  182 lbs and 10/24:187 lbs.  Appears her weight is highly variable. She takes diuretics.   Current diet order is Heart Healthy patient is consuming approximately 75% of meals at this time. Labs and medications reviewed.   No nutrition interventions warranted at this time. If nutrition issues arise, please consult RD.   Burtis Junes RD, LDN, CNSC Clinical Nutrition Pager: [phone] 11/26/2016 4:40 PM

## 2016-11-26 NOTE — Progress Notes (Signed)
PT Cancellation Note  Patient Details Name: Robin Blackburn MRN: [id number] DOB: [birth year]   Cancelled Treatment:    Reason Eval/Treat Not Completed: Patient's level of consciousness (Chart reviewed, RN consulted. Pt continues to be with AMS. ) When asked how many stairs at egress, she relied "its a 1 story pocketbook with 7 steps.' When asked how many hours each day her aide assists, she replied '25.' When asked for clarification, she reports '300, but it varries.' Will monitor progress remotely and attempt OOB assessment again at later date/time.   12:02 PM, 11/26/16 [name], PT, DPT Physical Therapist - [place] [phone] (Office)    Buccola,Allan C 11/26/2016, 12:00 PM

## 2016-11-26 NOTE — Progress Notes (Signed)
Patient had a formed stool last night.  Not appropriate for CDiff testing per lab.  Dr. Cathlean Sauer notified and gave order to discontinue Enteric precautions.

## 2016-11-26 NOTE — Progress Notes (Signed)
Pharmacy Antibiotic Note  Robin Blackburn is a 71 y.o. female admitted on 11/25/2016 with UTI.  Pharmacy has been consulted for Ceftriaxone dosing. Patient with PCN allergy, has tolerated ceftriaxone in the past. Plan: Ceftriaxone 1gm IV q24h F/U cxs and clinical progress Dees[place]ate tx as indicated  Height: 5\' 6"  (167.6 cm) Weight: 198 lb 11.2 oz (90.1 kg) IBW/kg (Calculated) : 59.3  Temp (24hrs), Avg:98.8 F (37.1 C), Min:98.3 F (36.8 C), Max:99.2 F (37.3 C)   Recent Labs Lab 11/25/16 1405 11/26/16 0450  WBC 10.6* 12.5*  CREATININE 0.82 0.73    Estimated Creatinine Clearance: 72.9 mL/min (by C-G formula based on SCr of 0.73 mg/dL).    Allergies  Allergen Reactions  . Demerol [Meperidine] Anaphylaxis  . Hydromorphone Anaphylaxis  . Penicillins Anaphylaxis    Has patient had a PCN reaction causing immediate rash, facial/tongue/throat swelling, SOB or lightheadedness with hypotension: Yes Has patient had a PCN reaction causing severe rash involving mucus membranes or skin necrosis: No Has patient had a PCN reaction that required hospitalization No Has patient had a PCN reaction occurring within the last 10 years: No If all of the above answers are "NO", then may proceed with Cephalosporin use.   . Sulfa Antibiotics Nausea And Vomiting  . Bisphosphonates     GI intolerance  . Fentanyl Other (See Comments)    "felt like I had demons in my head"  . Iohexol   . Percocet [Oxycodone-Acetaminophen] Swelling    Mouth swelling.  . Shellfish Allergy     Glucosamine not an option  . Terramycin [Oxytetracycline] Nausea And Vomiting  . Ciprofloxacin Nausea Only and Rash  . Levaquin [Levofloxacin In D5w] Nausea And Vomiting and Rash  . Morphine And Related Rash    Antimicrobials this admission: Ceftriaxone 6/20 >>  Monurol(fosfomycin) x 1 in ED 6/19  Microbiology results: 6/19 UCx: pending  Thank you for allowing pharmacy to be a part of this patient's care.  Isac Sarna, BS Pharm D, [place] Clinical Pharmacist Pager [phone] 11/26/2016 10:33 AM

## 2016-11-26 NOTE — Progress Notes (Signed)
Pt had one episode of vomiting. Large amount of clear emesis. She also had a bowel movement at this time. Collected stool sample and sent to lab for cdiff testing. Informed by lab personnel that stool could not be tested because it was formed.

## 2016-11-26 NOTE — Progress Notes (Signed)
Pt's home medications counted and sent to pharmacy. Please note that several medication bottles contained pills that did not belong in that bottle. It appears that they may have been mixed up.

## 2016-11-26 NOTE — Progress Notes (Signed)
Patient output <100 ml.  Bladder scan shows >100 ml.  Dr. Cathlean Sauer notified and stated to continue to monitor.  No additional orders at this time.

## 2016-11-27 DIAGNOSIS — M25562 Pain in left knee: Secondary | ICD-10-CM

## 2016-11-27 LAB — BASIC METABOLIC PANEL
Anion gap: 8 (ref 5–15)
BUN: 6 mg/dL (ref 6–20)
CO2: 26 mmol/L (ref 22–32)
Calcium: 7.8 mg/dL — ABNORMAL LOW (ref 8.9–10.3)
Chloride: 103 mmol/L (ref 101–111)
Creatinine, Ser: 0.57 mg/dL (ref 0.44–1.00)
GFR calc Af Amer: >60 mL/min (ref >60)
GLUCOSE: 92 mg/dL (ref 65–99)
POTASSIUM: 3.2 mmol/L — AB (ref 3.5–5.1)
Sodium: 137 mmol/L (ref 135–145)

## 2016-11-27 LAB — CBC WITH DIFFERENTIAL/PLATELET
Basophils Absolute: 0 10*3/uL (ref 0.0–0.1)
Basophils Relative: 0 %
Eosinophils Absolute: 0.1 10*3/uL (ref 0.0–0.7)
Eosinophils Relative: 2 %
HCT: 37.6 % (ref 36.0–46.0)
Hemoglobin: 12.3 g/dL (ref 12.0–15.0)
LYMPHS ABS: 1.7 10*3/uL (ref 0.7–4.0)
LYMPHS PCT: 21 %
MCH: 30.7 pg (ref 26.0–34.0)
MCHC: 32.7 g/dL (ref 30.0–36.0)
MCV: 93.8 fL (ref 78.0–100.0)
MONO ABS: 1 10*3/uL (ref 0.1–1.0)
MONOS PCT: 12 %
Neutro Abs: 5.2 10*3/uL (ref 1.7–7.7)
Neutrophils Relative %: 65 %
PLATELETS: 310 10*3/uL (ref 150–400)
RBC: 4.01 MIL/uL (ref 3.87–5.11)
RDW: 13.6 % (ref 11.5–15.5)
WBC: 8 10*3/uL (ref 4.0–10.5)

## 2016-11-27 MED ORDER — POTASSIUM CHLORIDE 20 MEQ PO PACK
40.0000 meq | PACK | Freq: Once | ORAL | Status: AC
  Administered 2016-11-27: 40 meq via ORAL
  Filled 2016-11-27: qty 2

## 2016-11-27 NOTE — Evaluation (Signed)
Physical Therapy Evaluation Patient Details Name: Robin Blackburn MRN: [id number] DOB: 1946/01/08 Today's Date: 11/27/2016   History of Present Illness  Robin Blackburn is a 71yo white female who comes to [facility] with worsening confusion and weakness after a medication taper, found to be with UTI. PMH: RA, fibromyalgia, GAD, HTN, CVA.   Clinical Impression  Pt admitted with above diagnosis. Pt currently with functional limitations due to the deficits listed below (see "PT Problem List"). Upon entry, the patient is received semirecumbent in bed, no family/caregiver present. The pt is awake and agreeable to participate. Pt complaining of exacerbation of chronic pain in RLE, back, and B feet. The pt is alert and oriented x3, pleasant, conversational, and following simple commands consistently. Functional mobility assessment demonstrates moderate to heavy strength impairment, unable to move RLE due to pain, unable to stand with TotalAssist and RW. Pt typically able to perform AMB throughout her home at baseline with rollator, but unable to stand today.  Pt will benefit from skilled PT intervention to increase independence and safety with basic mobility in preparation for discharge to the venue listed below.       Follow Up Recommendations SNF    Equipment Recommendations  Wheelchair (measurements PT) (foot rests; cushion; antitippers  )    Recommendations for Other Services       Precautions / Restrictions Precautions Precautions: Fall      Mobility  Bed Mobility Overal bed mobility: Needs Assistance Bed Mobility: Supine to Sit     Supine to sit: Max assist     General bed mobility comments: TotalA of RLE due to pain  Transfers Overall transfer level: Needs assistance Equipment used: Rolling walker (2 wheeled) Transfers: Sit to/from Stand Sit to Stand: Total assist;From elevated surface         General transfer comment: unable to come to ful standing with Total Assistance +1    Ambulation/Gait                Stairs            Wheelchair Mobility    Modified [place] (Stroke Patients Only)       Balance                                             Pertinent Vitals/Pain Pain Assessment: Faces Faces Pain Scale: Hurts even more Pain Location: Right knee, bilat arches Pain Intervention(s): Limited activity within patient's tolerance;Monitored during session;Repositioned    Home Living Family/patient expects to be discharged to:: Private residence Living Arrangements: Alone Available Help at Discharge: Personal care attendant (paid for privately, but is near end of funds) Type of Home: House Home Access: Stairs to enter Entrance Stairs-Rails: Right Entrance Stairs-Number of Steps: 5 steps with HR on the Right.  Home Layout: One level Home Equipment: [place] - 4 wheels;Wheelchair - Regulatory affairs officer - single point;Bedside commode Additional Comments: renting a WC. Unable to get RW into BR.     Prior Function Level of Independence: Needs assistance   Gait / Transfers Assistance Needed: Pt states that she uses the rollator to get around the house. Pt states she is able to ambulate room to room because it is a small apartment.   ADL's / Homemaking Assistance Needed: Aide services 5d/week, 7hrs/day        Hand Dominance        Extremity/Trunk Assessment  Upper Extremity Assessment Upper Extremity Assessment: Generalized weakness    Lower Extremity Assessment Lower Extremity Assessment: Generalized weakness       Communication      Cognition Arousal/Alertness: Awake/alert Behavior During Therapy: WFL for tasks assessed/performed Overall Cognitive Status: Within Functional Limits for tasks assessed                                        General Comments      Exercises     Assessment/Plan    PT Assessment Patient needs continued PT services  PT Problem List Decreased  strength;Decreased activity tolerance;Decreased mobility;Pain;Obesity       PT Treatment Interventions DME instruction;Functional mobility training;Therapeutic activities;Therapeutic exercise;Balance training;Patient/family education    PT Goals (Current goals can be found in the Care Plan section)  Acute Rehab PT Goals Patient Stated Goal: regain strength to perform HH AMB  PT Goal Formulation: With patient Time For Goal Achievement: 12/11/16 Potential to Achieve Goals: Fair    Frequency Min 2X/week   Barriers to discharge Inaccessible home environment 5 stairs to enter; requires assistance for ADL     Co-evaluation               AM-PAC PT "6 Clicks" Daily Activity  Outcome Measure Difficulty turning over in bed (including adjusting bedclothes, sheets and blankets)?: Total Difficulty moving from lying on back to sitting on the side of the bed? : Total Difficulty sitting down on and standing up from a chair with arms (e.g., wheelchair, bedside commode, etc,.)?: Total Help needed moving to and from a bed to chair (including a wheelchair)?: Total Help needed walking in hospital room?: Total Help needed climbing 3-5 steps with a railing? : Total 6 Click Score: 6    End of Session Equipment Utilized During Treatment: Gait belt Activity Tolerance: Patient tolerated treatment well;Patient limited by fatigue;Patient limited by pain Patient left: in bed;with call bell/phone within reach;with bed alarm set (table in front, phonenearby) Nurse Communication: Mobility status (pt asks for ice adn water) PT Visit Diagnosis: Muscle weakness (generalized) (M62.81);Difficulty in walking, not elsewhere classified (R26.2)    Time: 1517-6160 PT Time Calculation (min) (ACUTE ONLY): 17 min   Charges:   PT Evaluation $PT Eval Moderate Complexity: 1 Procedure PT Treatments $Therapeutic Activity: 8-22 mins   PT G Codes:        3:22 PM, 12-25-16 [name], PT, DPT Physical  Therapist - [place] [phone] (Office)   Laneshia Pina C 25-Dec-2016, 3:18 PM

## 2016-11-27 NOTE — Progress Notes (Signed)
PROGRESS NOTE    Robin Blackburn  RKY:[id number] DOB: 12/21/1945 DOA: 11/25/2016 PCP: Redmond School, MD    Brief Narrative:  71 year old female who presented to hospital with withdrawal symptoms. She is known to have anxiety disorder, rheumatoid arthritis, irritable bowel syndrome. For last 2 weeks she has been instructed to taper down her alprazolam, she has developed increased anxiety, restlessness and confusion. No fevers or chills. On initial physical examination blood pressure 135/93, heart rate 78, temperature 98.3, respiratory rate 24 oxygen saturation 98%, positive intermittent episodes of confusion, lungs were clear to auscultation bilaterally, heart S1-S2 present rhythmic, abdomen soft nontender, no lower extremity edema. Sodium 132, potassium 3.4, chloride 94, bicarbonate 25, glucose 128, BUN 5, creatinine 0.820 white count 10.6, hemoglobin 14.7, hematocrit 44.7, platelets 475, urinalysis had too numerous count white cells, specific gravity 1.021 alcohol less than 5, UDS positive for benzodiazepines. Patient was admitted to the hospital with the working diagnosis of metabolic encephalopathy due to urinary tract infection, complicated by hyponatremia and hypokalemia.   Assessment & Plan:   Principal Problem:   Confusion associated with infection (UTI) Active Problems:   Essential hypertension   H/O: CVA (cerebrovascular accident)   Acute pain of left knee   UTI (urinary tract infection)   1. Metabolic encephalopathy. Yesterday patient confused and agitated, this am more calm and less confused, answering to questions appropriately. Continue supportive IV antibiotics and IV fluids, neuro checks per unit protocol. Unable to work with pt yesterday due to encephalopathy, to attempt today, again.    2. Urine tract infection (present on admission). Culture positive for K. Pneumonia, will continue antibiotic therapy and will follow on sensitivities. Positive nausea but no vomiting. Plan for  discharge home in am.   3. HTN. Systolic blood pressure 315'V. will continue metoprolol. Isotonic saline at 75 cc per hour with no signs of volume overload.  4. HypoNatremia and Hypokalemia. Continue hydration with isotonic saline at 75 cc per hour, will replete K with kcl and follow renal panel in am, renal function preserved with cr at 0,57. Serum Na at 137 and K at 3,2.   5. Anxiety. On alprazolam, buspirone, paroxetine, trazodone. Did not require as needed haldol.   6. Right knee pain due to osteoarthritis. Tolerating well ibuprofen, will continue therapy for now, along with antiacid therapy for GI prophylaxis.   7. RA. Continue hydroxychloroquine, for now.   DVT prophylaxis: enoxaparin  Code Status: full  Family Communication: No family at the bedside  Disposition Plan: home    Consultants:     Procedures:     Antimicrobials:   Ceftriaxone     Subjective: Patient developed worsening confusion and agitation yesterday, has improved today, no further agitation, calm and able to respond to questions, knee pain on the right has improved. Mild nausea, but not vomiting.   Objective: Vitals:   11/26/16 1409 11/26/16 1648 11/26/16 2137 11/27/16 0630  BP:  (!) 144/97 (!) 146/76 138/72  Pulse: 76  75 76  Resp: 18  18 20   Temp: 98.8 F (37.1 C)  98.2 F (36.8 C) 98.6 F (37 C)  TempSrc: Oral  Oral Oral  SpO2: 98%  99% 98%  Weight:      Height:        Intake/Output Summary (Last 24 hours) at 11/27/16 1142 Last data filed at 11/27/16 0900  Gross per 24 hour  Intake           1602.5 ml  Output  51 ml  Net           1551.5 ml   Filed Weights   11/25/16 1329 11/25/16 1917  Weight: 93 kg (205 lb) 90.1 kg (198 lb 11.2 oz)    Examination:  General exam: not in pain, deconditioned E ENT: mild pallor, oral mucosa moist, no icterus Respiratory system: Clear to auscultation. Respiratory effort normal. No wheezing, rales or rhonchi.  Cardiovascular  system: S1 & S2 heard, RRR. No JVD, murmurs, rubs, gallops or clicks. No pedal edema. Gastrointestinal system: Abdomen is nondistended, soft and nontender. No organomegaly or masses felt. Normal bowel sounds heard. Central nervous system: Alert and oriented. No focal neurological deficits. Extremities: Symmetric 5 x 5 power. Right kneed edema but no erythema.  Skin: No rashes, lesions or ulcers Psychiatry: Judgement and insight appear normal. Mood & affect appropriate.     Data Reviewed: I have personally reviewed following labs and imaging studies  CBC:  Recent Labs Lab 11/25/16 1405 11/26/16 0450 11/27/16 0511  WBC 10.6* 12.5* 8.0  NEUTROABS 6.9  --  5.2  HGB 14.7 13.7 12.3  HCT 44.7 41.4 37.6  MCV 92.9 92.0 93.8  PLT 475* 410* 382   Basic Metabolic Panel:  Recent Labs Lab 11/25/16 1405 11/26/16 0450 11/27/16 0511  NA 132* 136 137  K 3.4* 3.6 3.2*  CL 94* 103 103  CO2 25 25 26   GLUCOSE 128* 136* 92  BUN 5* 7 6  CREATININE 0.82 0.73 0.57  CALCIUM 8.5* 8.1* 7.8*   GFR: Estimated Creatinine Clearance: 72.9 mL/min (by C-G formula based on SCr of 0.57 mg/dL). Liver Function Tests:  Recent Labs Lab 11/25/16 1405  AST 29  ALT 15  ALKPHOS 90  BILITOT 0.8  PROT 6.8  ALBUMIN 3.2*   No results for input(s): LIPASE, AMYLASE in the last 168 hours. No results for input(s): AMMONIA in the last 168 hours. Coagulation Profile: No results for input(s): INR, PROTIME in the last 168 hours. Cardiac Enzymes: No results for input(s): CKTOTAL, CKMB, CKMBINDEX, TROPONINI in the last 168 hours. BNP (last 3 results) No results for input(s): PROBNP in the last 8760 hours. HbA1C: No results for input(s): HGBA1C in the last 72 hours. CBG: No results for input(s): GLUCAP in the last 168 hours. Lipid Profile: No results for input(s): CHOL, HDL, LDLCALC, TRIG, CHOLHDL, LDLDIRECT in the last 72 hours. Thyroid Function Tests: No results for input(s): TSH, T4TOTAL, FREET4, T3FREE,  THYROIDAB in the last 72 hours. Anemia Panel: No results for input(s): VITAMINB12, FOLATE, FERRITIN, TIBC, IRON, RETICCTPCT in the last 72 hours. Sepsis Labs: No results for input(s): PROCALCITON, LATICACIDVEN in the last 168 hours.  Recent Results (from the past 240 hour(s))  Urine culture     Status: Abnormal (Preliminary result)   Collection Time: 11/25/16  4:30 PM  Result Value Ref Range Status   Specimen Description URINE, CATHETERIZED  Final   Special Requests NONE  Final   Culture (A)  Final    >=100,000 COLONIES/mL KLEBSIELLA PNEUMONIAE SUSCEPTIBILITIES TO FOLLOW Performed at [facility], [address] [place], [place] [address]    Report Status PENDING  Incomplete         Radiology Studies: Dg Knee Complete 4 Views Right  Result Date: 11/25/2016 CLINICAL DATA:  RIGHT KNEE PAIN S/P RIGHT LEG BEING BENT BACK UNDER HER WHILE EMS WAS TRANSPORTING HER EXAM: RIGHT KNEE - COMPLETE 4+ VIEW COMPARISON:  05/24/2014 FINDINGS: Mild osteopenia. 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  Small to moderate suprapatellar joint effusion is decreased in size since the prior. No acute fracture or dislocation. 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 Electronically Signed   By: Abigail Miyamoto M.D.   On: 11/25/2016 15:48        Scheduled Meds: . aspirin  81 mg Oral Daily  . atorvastatin  10 mg Oral q1800  . busPIRone  10 mg Oral TID  . cycloSPORINE  1 drop Both Eyes BID  . diclofenac sodium  2 g Topical QID  . feeding supplement (ENSURE ENLIVE)  237 mL Oral BID BM  . folic acid  1 mg Oral Daily  . heparin  5,000 Units Subcutaneous Q8H  . hydroxychloroquine  200 mg Oral Daily  . ibuprofen  200 mg Oral TID  . methocarbamol  500 mg Oral TID  . metoprolol tartrate  25 mg Oral BID  . ondansetron  4 mg Oral Daily  . pantoprazole  40 mg Oral Daily  . PARoxetine  40 mg Oral Daily  . potassium chloride  40 mEq Oral Once  .  simethicone  80 mg Oral QHS  . sodium chloride flush  3 mL Intravenous Q12H  . traZODone  100 mg Oral QHS   Continuous Infusions: . sodium chloride    . sodium chloride 75 mL/hr at 11/26/16 1013  . cefTRIAXone (ROCEPHIN)  IV 1 g (11/27/16 1019)     LOS: 2 days        Mauricio Gerome Apley, MD [facility] Pager [phone]  If 7PM-7AM, please contact night-coverage [email or web address] Password [email or web address] 11/27/2016, 11:42 AM

## 2016-11-28 DIAGNOSIS — Z8673 Personal history of transient ischemic attack (TIA), and cerebral infarction without residual deficits: Secondary | ICD-10-CM

## 2016-11-28 LAB — CBC WITH DIFFERENTIAL/PLATELET
BASOS PCT: 1 %
Basophils Absolute: 0 10*3/uL (ref 0.0–0.1)
EOS ABS: 0.3 10*3/uL (ref 0.0–0.7)
EOS PCT: 4 %
HCT: 36.4 % (ref 36.0–46.0)
HEMOGLOBIN: 11.9 g/dL — AB (ref 12.0–15.0)
Lymphocytes Relative: 27 %
Lymphs Abs: 2.2 10*3/uL (ref 0.7–4.0)
MCH: 30.8 pg (ref 26.0–34.0)
MCHC: 32.7 g/dL (ref 30.0–36.0)
MCV: 94.3 fL (ref 78.0–100.0)
MONOS PCT: 10 %
Monocytes Absolute: 0.8 10*3/uL (ref 0.1–1.0)
NEUTROS PCT: 58 %
Neutro Abs: 4.8 10*3/uL (ref 1.7–7.7)
PLATELETS: 311 10*3/uL (ref 150–400)
RBC: 3.86 MIL/uL — ABNORMAL LOW (ref 3.87–5.11)
RDW: 13.9 % (ref 11.5–15.5)
WBC: 8.1 10*3/uL (ref 4.0–10.5)

## 2016-11-28 LAB — BASIC METABOLIC PANEL
ANION GAP: 8 (ref 5–15)
BUN: 7 mg/dL (ref 6–20)
CALCIUM: 7.6 mg/dL — AB (ref 8.9–10.3)
CO2: 22 mmol/L (ref 22–32)
Chloride: 105 mmol/L (ref 101–111)
Creatinine, Ser: 0.65 mg/dL (ref 0.44–1.00)
GLUCOSE: 112 mg/dL — AB (ref 65–99)
POTASSIUM: 3.4 mmol/L — AB (ref 3.5–5.1)
Sodium: 135 mmol/L (ref 135–145)

## 2016-11-28 MED ORDER — DEXTROSE 5 % IV SOLN
1.0000 g | INTRAVENOUS | Status: DC
  Filled 2016-11-28 (×3): qty 10

## 2016-11-28 MED ORDER — CEPHALEXIN 500 MG PO CAPS: 500.0000 mg | ORAL_CAPSULE | Freq: Two times a day (BID) | ORAL | 0 refills | Status: DC

## 2016-11-28 MED ORDER — CEPHALEXIN 500 MG PO CAPS
500.0000 mg | ORAL_CAPSULE | Freq: Two times a day (BID) | ORAL | Status: DC
  Filled 2016-11-28: qty 1

## 2016-11-28 MED ORDER — ALPRAZOLAM 0.5 MG PO TABS: 0.5000 mg | ORAL_TABLET | Freq: Three times a day (TID) | ORAL | 0 refills | Status: DC | PRN

## 2016-11-28 MED ORDER — IBUPROFEN 200 MG PO TABS: 200.0000 mg | ORAL_TABLET | Freq: Three times a day (TID) | ORAL | 0 refills | Status: DC | PRN

## 2016-11-28 MED ORDER — DICLOFENAC SODIUM 1 % TD GEL
2.0000 g | Freq: Four times a day (QID) | TRANSDERMAL | 0 refills | Status: DC
Start: 2016-11-28 — End: 2018-07-30

## 2016-11-28 MED ORDER — CEFTRIAXONE SODIUM 1 G IJ SOLR: 1.0000 g | INTRAMUSCULAR | 0 refills | Status: AC

## 2016-11-28 MED ORDER — ENSURE ENLIVE PO LIQD: 237.0000 mL | Freq: Two times a day (BID) | ORAL | 12 refills | Status: DC

## 2016-11-28 NOTE — Care Management Note (Signed)
Case Management Note  Patient Details  Name: Robin Blackburn MRN: [id number] Date of Birth: Jun 02, 1946  Subjective/Objective:                  Pt admitted with UTI. Pt from home, lives alone as no real family support. She has aid M-F 9am-2pm. She was DC'd from [place] in Dec 2017 and had [facility], pt prefers [facility]. Pt is WC bound, can transfer ind and has bedrails on her bed. Her aid takes her to appointments. PT has recommended SNF, pt agreeable.   Action/Plan: DC to SNF today. CSW making arrangements for placement. No CM needs at this time.   Expected Discharge Date:  11/28/16               Expected Discharge Plan:  [place]  In-House Referral:  Clinical Social Work  Discharge planning Services  CM Consult  Post Acute Care Choice:  NA Choice offered to:  NA  Status of Service:  Completed, signed off  Sherald Barge, RN 11/28/2016, 10:11 AM

## 2016-11-28 NOTE — Progress Notes (Signed)
Transferred to [facility] by EMS

## 2016-11-28 NOTE — Care Management Important Message (Signed)
Important Message  Patient Details  Name: Robin Blackburn MRN: [id number] Date of Birth: [birth year]   Medicare Important Message Given:  Yes    Sherald Barge, RN 11/28/2016, 10:07 AM

## 2016-11-28 NOTE — Progress Notes (Addendum)
Patient has been accepted to [facility] for transport today Patient will transport by EMS. Brother aware of plan and agreeable. RN Jenny Reichmann aware of plan and called regarding bed acceptance.  Patient is aware of plan and RN will follow up. Patient can transfer after 3:15pm by EMS.  No other needs.  DC to SNF.  Lane Hacker, MSW Clinical Social Work: [employer] Coverage for :  [phone]

## 2016-11-28 NOTE — NC FL2 (Signed)
[place] LEVEL OF CARE SCREENING TOOL     IDENTIFICATION  Patient Name: Robin Blackburn Birthdate: [birth year] Sex: female Admission Date (Current Location): 11/25/2016  [facility] and [place] Number:  [employer] and Address:  [place] [address], [place]      Provider Number: [id number]  Attending Physician Name and Address:  Tawni Millers  Relative Name and Phone Number:       Current Level of Care: Hospital Recommended Level of Care: [place] Prior Approval Number:    Date Approved/Denied:   PASRR Number: [id number]  Discharge Plan: SNF    Current Diagnoses: Patient Active Problem List   Diagnosis Date Noted  . Acute lower UTI 11/25/2016  . Confusion associated with infection (UTI) 11/25/2016  . Urinary tract infection without hematuria 04/02/2016  . UTI (urinary tract infection) 04/02/2016  . Benzodiazepine withdrawal with perceptual disturbance ([place]) 04/02/2016  . Acute pain of left knee 12/18/2015  . Osteoporosis 11/01/2015  . Vitamin D deficiency 10/11/2015  . History of fracture of left ankle 09/28/2015  . Chronic pain in left foot 09/28/2015  . Fibromyalgia 09/28/2015  . Rash and nonspecific skin eruption 07/17/2015  . Cerumen impaction 05/30/2015  . Edema 04/16/2015  . Cellulitis 04/16/2015  . Mouth pain 04/08/2015  . Pain in joint, lower leg 04/08/2015  . GERD (gastroesophageal reflux disease) 04/05/2015  . Occult blood positive stool 01/17/2015  . Diarrhea 01/05/2015  . Chronic pain syndrome 12/02/2014  . Pain in joint, upper arm 10/29/2014  . Nasal congestion 10/29/2014  . Allergic rhinitis 08/20/2014  . Conjunctivitis 08/08/2014  . Dysuria 06/27/2014  . Back pain, lumbosacral 06/23/2014  . Sinusitis, chronic 06/17/2014  . Fall 05/24/2014  . H/O: CVA (cerebrovascular accident) 05/24/2014  . HLD (hyperlipidemia) 05/24/2014  . Physical deconditioning 05/24/2014   . Fracture of fifth metatarsal bone of right foot 03/06/2014  . Right sided weakness 02/26/2014  . Leukocytosis 02/20/2014  . Rheumatoid arthritis ([place]) 02/20/2014  . CVA (cerebral infarction) 02/19/2014  . Right ankle sprain 02/19/2014  . Contusion of right foot 02/19/2014  . Essential hypertension 02/19/2014  . Neuropathy 02/19/2014  . Anxiety 02/19/2014  . Hyponatremia 02/19/2014  . Hypokalemia 02/19/2014    Orientation RESPIRATION BLADDER Height & Weight     Self, Time, Situation, Place  Normal Incontinent Weight: 198 lb 11.2 oz (90.1 kg) Height:  5\' 6"  (167.6 cm)  BEHAVIORAL SYMPTOMS/MOOD NEUROLOGICAL BOWEL NUTRITION STATUS      Continent Diet (regular diet)  AMBULATORY STATUS COMMUNICATION OF NEEDS Skin   Extensive Assist Verbally Normal                       Personal Care Assistance Level of Assistance  Bathing, Feeding, Dressing Bathing Assistance: Maximum assistance Feeding assistance: Limited assistance Dressing Assistance: Maximum assistance     Functional Limitations Info  Sight, Hearing, Speech Sight Info: Adequate Hearing Info: Adequate Speech Info: Adequate    SPECIAL CARE FACTORS FREQUENCY  PT (By licensed PT), OT (By licensed OT)     PT Frequency: 5x OT Frequency: 5x            Contractures Contractures Info: Not present    Additional Factors Info  Code Status, Allergies Code Status Info: Full Code Allergies Info: Demerol Meperidine, Hydromorphone, Penicillins, Sulfa Antibiotics, Bisphosphonates, Fentanyl, Iohexol, Percocet Oxycodone-acetaminophen, Shellfish Allergy, Terramycin Oxytetracycline, Ciprofloxacin, Levaquin Levofloxacin In D5w, Morphine And Related  Current Medications (11/28/2016):  This is the current hospital active medication list Current Facility-Administered Medications  Medication Dose Route Frequency Provider Last Rate Last Dose  . 0.9 %  sodium chloride infusion  250 mL Intravenous PRN Emokpae, Courage,  MD      . 0.9 %  sodium chloride infusion   Intravenous Continuous Johna Kearl, Jimmy Picket, MD 75 mL/hr at 11/28/16 0556    . acetaminophen (TYLENOL) tablet 650 mg  650 mg Oral Q6H PRN Roxan Hockey, MD   650 mg at 11/28/16 0303   Or  . acetaminophen (TYLENOL) suppository 650 mg  650 mg Rectal Q6H PRN Emokpae, Courage, MD      . albuterol (PROVENTIL) (2.5 MG/3ML) 0.083% nebulizer solution 2.5 mg  2.5 mg Nebulization Q2H PRN Emokpae, Courage, MD      . ALPRAZolam Duanne Moron) tablet 0.5 mg  0.5 mg Oral TID PRN Roxan Hockey, MD   0.5 mg at 11/26/16 2118  . aspirin chewable tablet 81 mg  81 mg Oral Daily Emokpae, Courage, MD   81 mg at 11/27/16 1024  . atorvastatin (LIPITOR) tablet 10 mg  10 mg Oral q1800 Emokpae, Courage, MD   10 mg at 11/27/16 1724  . busPIRone (BUSPAR) tablet 10 mg  10 mg Oral TID Roxan Hockey, MD   10 mg at 11/27/16 2146  . cefTRIAXone (ROCEPHIN) 1 g in dextrose 5 % 50 mL IVPB  1 g Intravenous Q24H Tawni Millers, MD 100 mL/hr at 11/28/16 0916 1 g at 11/28/16 0916  . cycloSPORINE (RESTASIS) 0.05 % ophthalmic emulsion 1 drop  1 drop Both Eyes BID Roxan Hockey, MD   1 drop at 11/27/16 2148  . diclofenac sodium (VOLTAREN) 1 % transdermal gel 2 g  2 g Topical QID Tawni Millers, MD   2 g at 11/27/16 2147  . feeding supplement (ENSURE ENLIVE) (ENSURE ENLIVE) liquid 237 mL  237 mL Oral BID BM Emokpae, Courage, MD      . folic acid (FOLVITE) tablet 1 mg  1 mg Oral Daily Emokpae, Courage, MD   1 mg at 11/27/16 1551  . haloperidol lactate (HALDOL) injection 1 mg  1 mg Intravenous Q6H PRN Joeanthony Seeling, Jimmy Picket, MD      . heparin injection 5,000 Units  5,000 Units Subcutaneous Q8H Roxan Hockey, MD   5,000 Units at 11/28/16 0551  . hydroxychloroquine (PLAQUENIL) tablet 200 mg  200 mg Oral Daily Emokpae, Courage, MD   200 mg at 11/27/16 1326  . ibuprofen (ADVIL,MOTRIN) tablet 200 mg  200 mg Oral TID Tawni Millers, MD   200 mg at 11/27/16 2147  .  loperamide (IMODIUM) capsule 2 mg  2 mg Oral QID PRN Emokpae, Courage, MD      . methocarbamol (ROBAXIN) tablet 500 mg  500 mg Oral TID Roxan Hockey, MD   500 mg at 11/27/16 2147  . metoprolol tartrate (LOPRESSOR) tablet 25 mg  25 mg Oral BID Roxan Hockey, MD   25 mg at 11/27/16 2147  . ondansetron (ZOFRAN) tablet 4 mg  4 mg Oral Q6H PRN Emokpae, Courage, MD       Or  . ondansetron (ZOFRAN) injection 4 mg  4 mg Intravenous Q6H PRN Denton Brick, Courage, MD   4 mg at 11/28/16 0303  . ondansetron (ZOFRAN) tablet 4 mg  4 mg Oral Daily Emokpae, Courage, MD   4 mg at 11/27/16 1025  . pantoprazole (PROTONIX) EC tablet 40 mg  40 mg Oral Daily Roxan Hockey, MD  40 mg at 11/27/16 1025  . PARoxetine (PAXIL) tablet 40 mg  40 mg Oral Daily Emokpae, Courage, MD   40 mg at 11/27/16 1551  . simethicone (MYLICON) chewable tablet 80 mg  80 mg Oral QHS Roxan Hockey, MD   80 mg at 11/27/16 2146  . sodium chloride (OCEAN) 0.65 % nasal spray 1 spray  1 spray Each Nare PRN Emokpae, Courage, MD      . sodium chloride flush (NS) 0.9 % injection 3 mL  3 mL Intravenous Q12H Emokpae, Courage, MD   3 mL at 11/27/16 2217  . sodium chloride flush (NS) 0.9 % injection 3 mL  3 mL Intravenous PRN Emokpae, Courage, MD      . traZODone (DESYREL) tablet 100 mg  100 mg Oral QHS Emokpae, Courage, MD   100 mg at 11/27/16 2147     Discharge Medications: Please see discharge summary for a list of discharge medications.  Relevant Imaging Results:  Relevant Lab Results:   Additional Information SSN: [id number]  Lilly [place], [place]

## 2016-11-28 NOTE — Progress Notes (Addendum)
Report called to [facility] [place].  All questions answered to [facility] satisfaction ( [facility] has changed name)

## 2016-11-28 NOTE — Clinical Social Work Note (Signed)
Clinical Social Work Assessment  Patient Details  Name: Robin Blackburn MRN: [id number] Date of Birth: December 01, 1945  Date of referral:  11/28/16               Reason for consult:  Facility Placement, Discharge Planning                Permission sought to share information with:  Case Manager, Customer service manager, Family Supports Permission granted to share information::  Yes, Verbal Permission Granted  Name::        Agency::     Relationship::  Brother  Contact Information:     Housing/Transportation Living arrangements for the past 2 months:  Single Family Home Source of Information:  Patient, Medical Team, Case Manager Patient Interpreter Needed:  None Criminal Activity/Legal Involvement Pertinent to Current Situation/Hospitalization:  No - Comment as needed Significant Relationships:  Other Family Members, Community Support Lives with:  Self (has hired help at home as well) Do you feel safe going back to the place where you live?  No Need for family participation in patient care:  No (Coment)  Care giving concerns:  Patient reports she is from home with hired help and needs more assistance at this time before going home. Patient reports she is feeling bad today, but agreeable to SNF as she has been through the SNF process in 2017 where she went to [facility] and [facility] in the past. Preference is [facility] if bed available.   Social Worker assessment / plan:  LCSW completed consult for ST placement. SNF work up completed. MD reporting possible DC today if bed available.  Will follow up with bed offers.  Employment status:  Retired Nurse, adult PT Recommendations:  [place] / Referral to community resources:  [place]  Patient/Family's Response to care:  understanding  Patient/Family's Understanding of and Emotional Response to Diagnosis, Current Treatment, and Prognosis:  Patient able to voice need for  short term rehab as she has become weaker and weaker.   Emotional Assessment Appearance:  Appears stated age Attitude/Demeanor/Rapport:    Affect (typically observed):  Adaptable Orientation:  Oriented to Self, Oriented to Place, Oriented to  Time, Oriented to Situation Alcohol / Substance use:  Not Applicable Psych involvement (Current and /or in the community):  No (Comment)  Discharge Needs  Concerns to be addressed:  No discharge needs identified Readmission within the last 30 days:  No Current discharge risk:  None Barriers to Discharge:  No Barriers Identified   Lilly Cove, LCSW 11/28/2016, 9:34 AM

## 2016-11-28 NOTE — Clinical Social Work Placement (Addendum)
   CLINICAL SOCIAL WORK PLACEMENT  NOTE  Date:  11/28/2016  Patient Details  Name: Robin Blackburn MRN: [id number] Date of Birth: 08/19/1945  Clinical Social Work is seeking post-discharge placement for this patient at the [place] level of care (*CSW will initial, date and re-position this form in  chart as items are completed):  Yes   Patient/family provided with [facility] Work Department's list of facilities offering this level of care within the geographic area requested by the patient (or if unable, by the patient's family).  Yes   Patient/family informed of their freedom to choose among providers that offer the needed level of care, that participate in Medicare, Medicaid or managed care program needed by the patient, have an available bed and are willing to accept the patient.  Yes   Patient/family informed of [facility]'s ownership interest in [facility] and [facility], as well as of the fact that they are under no obligation to receive care at these facilities.  PASRR submitted to EDS on       PASRR number received on       Existing PASRR number confirmed on 11/28/16     FL2 transmitted to all facilities in geographic area requested by pt/family on 11/28/16     FL2 transmitted to all facilities within larger geographic area on       Patient informed that his/her managed care company has contracts with or will negotiate with certain facilities, including the following:            Patient/family informed of bed offers received.  11/28/2016   Patient chooses bed at      [facility]  Physician recommends and patient chooses bed at      [facility] Patient to be transferred to   on  .  11/28/2016   Patient to be transferred to facility by      EMS  Patient family notified on   of transfer.  Patient and brother  Name of family member notified:        PHYSICIAN Please sign FL2     Additional Comment:     _______________________________________________ Lilly Cove, LCSW 11/28/2016, 9:32 AM

## 2016-11-28 NOTE — Discharge Summary (Addendum)
Physician Discharge Summary  Robin Blackburn IFO:[id number] DOB: 1946-02-17 DOA: 11/25/2016  PCP: Redmond School, MD  Admit date: 11/25/2016 Discharge date: 11/28/2016  Admitted From: home  Disposition:  SNF  Recommendations for Outpatient Follow-up:  1. Follow up with PCP in 1- weeks 2. Urine culture with Klebsiella Pneumonia, sensitivities to follow 3. Patient will be discharge with IM ceftriaxone for the next 5 days, multiple antibiotic allergies.   Home Health: Na  Equipment/Devices: NA   Discharge Condition: Stable  CODE STATUS: Full  Diet recommendation: Heart Healthy   Brief/Interim Summary: 71 year old female who presented to hospital with withdrawal symptoms. She is known to have anxiety disorder, rheumatoid arthritis, irritable bowel syndrome. For last 2 weeks she has been instructed to taper down her alprazolam, she has developed increased anxiety, restlessness and confusion. No fevers or chills. On initial physical examination blood pressure 135/93, heart rate 78, temperature 98.3, respiratory rate 24 oxygen saturation 98%, positive intermittent episodes of confusion, lungs were clear to auscultation bilaterally, heart S1-S2 present rhythmic, abdomen soft nontender, no lower extremity edema. Sodium 132, potassium 3.4, chloride 94, bicarbonate 25, glucose 128, BUN 5, creatinine 0.820 white count 10.6, hemoglobin 14.7, hematocrit 44.7, platelets 475, urinalysis had too numerous count white cells, specific gravity 1.021 alcohol less than 5, UDS positive for benzodiazepines. Patient was admitted to the hospital with the working diagnosis of metabolic encephalopathy due to urinary tract infection, complicated by hyponatremia and hypokalemia.  1. Metabolic encephalopathy. Patient was admitted to medical ward, she received supportive medical care with IV fluids and IV antibiotics with improvement of her symptoms. Patient seems to be more alert and oriented, likely back to her baseline, she  was seen by physical therapy, recommendations for skilled nursing facility.  2. Urinary tract infection, due to Klebsiella pneumonia, present on admission. Patient was placed on IV ceftriaxone, patient has remained afebrile, discharge white cell count 8.1. Urine culture positive for Klebsiella pneumonia, sensitivities to follow. Patient will be discharged on IM ceftiaxone. Patient allergic to penicillin.   3. Hypertension. Patient tolerated well IV fluids, continue on metoprolol.  4. Hyponatremia and hypokalemia. Patient received IV fluids with diastolic solutions, improvement of by mouth intake, discharge sodium is 135, potassium is 3.4.  5. Anxiety. Patient was continued on alprazolam, buspirone, paroxetine and trazodone. Positive episodes of agitation during the first 24 hours of hospitalization.   6. Right knee pain due to osteoarthritis. Patient was placed on low-dose ibuprofen with good toleration.  7. Rheumatoid arthritis. Continue hydroxychloroquine.   Discharge Diagnoses:  Principal Problem:   Confusion associated with infection (UTI) Active Problems:   Essential hypertension   H/O: CVA (cerebrovascular accident)   Acute pain of left knee   UTI (urinary tract infection)    Discharge Instructions   Allergies as of 11/28/2016      Reactions   Demerol [meperidine] Anaphylaxis   Hydromorphone Anaphylaxis   Keflex [cephalexin] Nausea And Vomiting   Penicillins Anaphylaxis   Has patient had a PCN reaction causing immediate rash, facial/tongue/throat swelling, SOB or lightheadedness with hypotension: Yes Has patient had a PCN reaction causing severe rash involving mucus membranes or skin necrosis: No Has patient had a PCN reaction that required hospitalization No Has patient had a PCN reaction occurring within the last 10 years: No If all of the above answers are "NO", then may proceed with Cephalosporin use.   Sulfa Antibiotics Nausea And Vomiting   Bisphosphonates    GI  intolerance   Fentanyl Other (See Comments)   "  felt like I had demons in my head"   Iohexol    Percocet [oxycodone-acetaminophen] Swelling   Mouth swelling.   Shellfish Allergy    Glucosamine not an option   Terramycin [oxytetracycline] Nausea And Vomiting   Ciprofloxacin Nausea Only, Rash   Levaquin [levofloxacin In D5w] Nausea And Vomiting, Rash   Morphine And Related Rash      Medication List    STOP taking these medications   acetaminophen 325 MG tablet Commonly known as:  TYLENOL   HYDROcodone-acetaminophen 7.5-325 MG tablet Commonly known as:  NORCO   methocarbamol 750 MG tablet Commonly known as:  ROBAXIN   METHYL SALICYLATE-LIDO-MENTHOL EX   nystatin powder Commonly known as:  MYCOSTATIN/NYSTOP   spironolactone 25 MG tablet Commonly known as:  ALDACTONE   zolpidem 12.5 MG CR tablet Commonly known as:  AMBIEN CR     TAKE these medications   ALPRAZolam 0.5 MG tablet Commonly known as:  XANAX Take 1 tablet (0.5 mg total) by mouth 3 (three) times daily as needed for anxiety. What changed:  medication strength  how much to take  additional instructions   busPIRone 5 MG tablet Commonly known as:  BUSPAR Take 5 mg by mouth 2 (two) times daily.   cefTRIAXone 1 g injection Commonly known as:  ROCEPHIN Inject 1 g into the muscle daily.   Cholecalciferol 5000 units Tabs Will take 5,000 unit along with 2,000 unit daily for a 7,000 unit daily   clopidogrel 75 MG tablet Commonly known as:  PLAVIX Take 75 mg by mouth at bedtime.   desloratadine 5 MG tablet Commonly known as:  CLARINEX Take 5 mg by mouth daily.   diclofenac sodium 1 % Gel Commonly known as:  VOLTAREN Apply 2 g topically 4 (four) times daily. Apply to the right knee.   dicyclomine 20 MG tablet Commonly known as:  BENTYL Take 20 mg by mouth every 6 (six) hours as needed. Abdominal cramps   docusate sodium 100 MG capsule Commonly known as:  COLACE Take 100 mg by mouth 2 (two) times  daily as needed for mild constipation.   feeding supplement (ENSURE ENLIVE) Liqd Take 237 mLs by mouth 2 (two) times daily between meals.   hydroxychloroquine 200 MG tablet Commonly known as:  PLAQUENIL TAKE ONE TABLET BY MOUTH ONCE DAILY.   ibuprofen 200 MG tablet Commonly known as:  ADVIL,MOTRIN Take 1 tablet (200 mg total) by mouth every 8 (eight) hours as needed for moderate pain (Right knee pain.).   lansoprazole 30 MG capsule Commonly known as:  PREVACID Take 30 mg by mouth 2 (two) times daily.   loperamide 2 MG tablet Commonly known as:  IMODIUM A-D Take 2 mg by mouth 4 (four) times daily as needed for diarrhea or loose stools.   metoprolol tartrate 25 MG tablet Commonly known as:  LOPRESSOR Take 1 tablet (25 mg total) by mouth 2 (two) times daily.   ondansetron 4 MG tablet Commonly known as:  ZOFRAN Take 4 mg by mouth every 6 (six) hours as needed. Give with plaquenil daily at 10 am. Give 4 mg as needed for N / V   PARoxetine 40 MG tablet Commonly known as:  PAXIL Take 1 tablet by mouth daily.   Propylene Glycol 0.6 % Soln Place 1 drop into both eyes daily. At noon.   RESTASIS 0.05 % ophthalmic emulsion Generic drug:  cycloSPORINE Place 1 drop into both eyes 2 (two) times daily.   Simethicone 125 MG Tabs Chew  125 mg by mouth at bedtime. And as needed.   sodium chloride 0.65 % Soln nasal spray Commonly known as:  OCEAN Place 1 spray into both nostrils as needed for congestion.   traZODone 50 MG tablet Commonly known as:  DESYREL Take 50 mg by mouth at bedtime.       Allergies  Allergen Reactions  . Demerol [Meperidine] Anaphylaxis  . Hydromorphone Anaphylaxis  . Penicillins Anaphylaxis    Has patient had a PCN reaction causing immediate rash, facial/tongue/throat swelling, SOB or lightheadedness with hypotension: Yes Has patient had a PCN reaction causing severe rash involving mucus membranes or skin necrosis: No Has patient had a PCN reaction that  required hospitalization No Has patient had a PCN reaction occurring within the last 10 years: No If all of the above answers are "NO", then may proceed with Cephalosporin use.   . Sulfa Antibiotics Nausea And Vomiting  . Bisphosphonates     GI intolerance  . Fentanyl Other (See Comments)    "felt like I had demons in my head"  . Iohexol   . Percocet [Oxycodone-Acetaminophen] Swelling    Mouth swelling.  . Shellfish Allergy     Glucosamine not an option  . Terramycin [Oxytetracycline] Nausea And Vomiting  . Ciprofloxacin Nausea Only and Rash  . Levaquin [Levofloxacin In D5w] Nausea And Vomiting and Rash  . Morphine And Related Rash    Consultations:     Procedures/Studies: Dg Knee Complete 4 Views Right  Result Date: 11/25/2016 CLINICAL DATA:  RIGHT KNEE PAIN S/P RIGHT LEG BEING BENT BACK UNDER HER WHILE EMS WAS TRANSPORTING HER EXAM: RIGHT KNEE - COMPLETE 4+ VIEW COMPARISON:  05/24/2014 FINDINGS: Mild osteopenia. 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 Small to moderate suprapatellar joint effusion is decreased in size since the prior. No acute fracture or dislocation. 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 Electronically Signed   By: Abigail Miyamoto M.D.   On: 11/25/2016 15:48       Subjective: Patient with no further confusion or agitation, positive generalized pain, significant weakness and ambulatory dysfunction, no nausea or vomiting.   Discharge Exam: Vitals:   11/27/16 2136 11/28/16 0545  BP: (!) 138/53 (!) 129/51  Pulse: 73 64  Resp: 18 18  Temp: 98.4 F (36.9 C) 98.5 F (36.9 C)   Vitals:   11/27/16 0630 11/27/16 1300 11/27/16 2136 11/28/16 0545  BP: 138/72 (!) 163/68 (!) 138/53 (!) 129/51  Pulse: 76 76 73 64  Resp: 20 18 18 18   Temp: 98.6 F (37 C) 97.5 F (36.4 C) 98.4 F (36.9 C) 98.5 F (36.9 C)  TempSrc: Oral Oral Oral Oral  SpO2: 98% 98% 97% 96%  Weight:      Height:        General:  Pt is alert, awake, not in acute distress Mil pallor, oral mucosa moist.  Cardiovascular: RRR, S1/S2 +, no rubs, no gallops Respiratory: CTA bilaterally, no wheezing, no rhonchi Abdominal: Soft, NT, ND, bowel sounds + Extremities: no edema, no cyanosis. Right knee edema.     The results of significant diagnostics from this hospitalization (including imaging, microbiology, ancillary and laboratory) are listed below for reference.     Microbiology: Recent Results (from the past 240 hour(s))  Urine culture     Status: Abnormal (Preliminary result)   Collection Time: 11/25/16  4:30 PM  Result Value Ref Range Status   Specimen Description URINE, CATHETERIZED  Final   Special Requests NONE  Final  Culture (A)  Final    >=100,000 COLONIES/mL KLEBSIELLA PNEUMONIAE SUSCEPTIBILITIES TO FOLLOW Performed at [facility], [place] [address] [place], [place] [address]    Report Status PENDING  Incomplete     Labs: BNP (last 3 results) No results for input(s): BNP in the last 8760 hours. Basic Metabolic Panel:  Recent Labs Lab 11/25/16 1405 11/26/16 0450 11/27/16 0511 11/28/16 0524  NA 132* 136 137 135  K 3.4* 3.6 3.2* 3.4*  CL 94* 103 103 105  CO2 25 25 26 22   GLUCOSE 128* 136* 92 112*  BUN 5* 7 6 7   CREATININE 0.82 0.73 0.57 0.65  CALCIUM 8.5* 8.1* 7.8* 7.6*   Liver Function Tests:  Recent Labs Lab 11/25/16 1405  AST 29  ALT 15  ALKPHOS 90  BILITOT 0.8  PROT 6.8  ALBUMIN 3.2*   No results for input(s): LIPASE, AMYLASE in the last 168 hours. No results for input(s): AMMONIA in the last 168 hours. CBC:  Recent Labs Lab 11/25/16 1405 11/26/16 0450 11/27/16 0511 11/28/16 0524  WBC 10.6* 12.5* 8.0 8.1  NEUTROABS 6.9  --  5.2 4.8  HGB 14.7 13.7 12.3 11.9*  HCT 44.7 41.4 37.6 36.4  MCV 92.9 92.0 93.8 94.3  PLT 475* 410* 310 311   Cardiac Enzymes: No results for input(s): CKTOTAL, CKMB, CKMBINDEX, TROPONINI in the last 168 hours. BNP: Invalid input(s):  POCBNP CBG: No results for input(s): GLUCAP in the last 168 hours. D-Dimer No results for input(s): DDIMER in the last 72 hours. Hgb A1c No results for input(s): HGBA1C in the last 72 hours. Lipid Profile No results for input(s): CHOL, HDL, LDLCALC, TRIG, CHOLHDL, LDLDIRECT in the last 72 hours. Thyroid function studies No results for input(s): TSH, T4TOTAL, T3FREE, THYROIDAB in the last 72 hours.  Invalid input(s): FREET3 Anemia work up No results for input(s): VITAMINB12, FOLATE, FERRITIN, TIBC, IRON, RETICCTPCT in the last 72 hours. Urinalysis    Component Value Date/Time   COLORURINE AMBER (A) 11/25/2016 1630   APPEARANCEUR HAZY (A) 11/25/2016 1630   LABSPEC 1.021 11/25/2016 1630   PHURINE 5.0 11/25/2016 1630   GLUCOSEU NEGATIVE 11/25/2016 1630   HGBUR MODERATE (A) 11/25/2016 1630   BILIRUBINUR NEGATIVE 11/25/2016 1630   KETONESUR NEGATIVE 11/25/2016 1630   PROTEINUR 30 (A) 11/25/2016 1630   UROBILINOGEN 0.2 04/10/2015 2212   NITRITE NEGATIVE 11/25/2016 1630   LEUKOCYTESUR MODERATE (A) 11/25/2016 1630   Sepsis Labs Invalid input(s): PROCALCITONIN,  WBC,  LACTICIDVEN Microbiology Recent Results (from the past 240 hour(s))  Urine culture     Status: Abnormal (Preliminary result)   Collection Time: 11/25/16  4:30 PM  Result Value Ref Range Status   Specimen Description URINE, CATHETERIZED  Final   Special Requests NONE  Final   Culture (A)  Final    >=100,000 COLONIES/mL KLEBSIELLA PNEUMONIAE SUSCEPTIBILITIES TO FOLLOW Performed at [facility], [place] [address] [place], [place] [address]    Report Status PENDING  Incomplete     Time coordinating discharge: 45 minutes  SIGNED:   Tawni Millers, MD  [facility] 11/28/2016, 9:24 AM Pager   If 7PM-7AM, please contact night-coverage [email or web address] Password [email or web address]

## 2016-11-30 LAB — URINE CULTURE

## 2016-12-02 ENCOUNTER — Telehealth: Payer: Self-pay

## 2016-12-02 NOTE — Telephone Encounter (Signed)
Possible re-admission to facility. This is a patient you were seeing at [facility] . [facility] F/U is needed if patient was re-admitted to facility upon discharge. Hospital discharge from [facility] on 11/28/2016

## 2016-12-03 ENCOUNTER — Emergency Department (HOSPITAL_COMMUNITY)
Admission: EM | Admit: 2016-12-03 | Discharge: 2016-12-04 | Disposition: A | Discharge status: Home / Self Care | Payer: Medicare Other | Attending: Emergency Medicine | Admitting: Emergency Medicine

## 2016-12-03 ENCOUNTER — Emergency Department (HOSPITAL_COMMUNITY): Payer: Medicare Other

## 2016-12-03 ENCOUNTER — Encounter (HOSPITAL_COMMUNITY): Payer: Self-pay | Admitting: Emergency Medicine

## 2016-12-03 DIAGNOSIS — S89001A Unspecified physeal fracture of upper end of right tibia, initial encounter for closed fracture: Secondary | ICD-10-CM | POA: Insufficient documentation

## 2016-12-03 DIAGNOSIS — Y939 Activity, unspecified: Secondary | ICD-10-CM | POA: Diagnosis not present

## 2016-12-03 DIAGNOSIS — Y999 Unspecified external cause status: Secondary | ICD-10-CM | POA: Insufficient documentation

## 2016-12-03 DIAGNOSIS — S82101A Unspecified fracture of upper end of right tibia, initial encounter for closed fracture: Secondary | ICD-10-CM

## 2016-12-03 DIAGNOSIS — Y929 Unspecified place or not applicable: Secondary | ICD-10-CM | POA: Insufficient documentation

## 2016-12-03 DIAGNOSIS — W228XXA Striking against or struck by other objects, initial encounter: Secondary | ICD-10-CM | POA: Insufficient documentation

## 2016-12-03 DIAGNOSIS — S8991XA Unspecified injury of right lower leg, initial encounter: Secondary | ICD-10-CM | POA: Diagnosis present

## 2016-12-03 DIAGNOSIS — I10 Essential (primary) hypertension: Secondary | ICD-10-CM | POA: Diagnosis not present

## 2016-12-03 NOTE — ED Triage Notes (Signed)
Pt C/O right knee pain. Pt states "I do not know what happened but I think I broke my knee." Per EMS staff at [facility] did not witness a fall or any type of injury.

## 2016-12-04 ENCOUNTER — Emergency Department (HOSPITAL_COMMUNITY): Payer: Medicare Other

## 2016-12-04 LAB — CBC WITH DIFFERENTIAL/PLATELET
BASOS ABS: 0.1 10*3/uL (ref 0.0–0.1)
Basophils Relative: 1 %
Eosinophils Absolute: 0.3 10*3/uL (ref 0.0–0.7)
Eosinophils Relative: 3 %
HEMATOCRIT: 35.5 % — AB (ref 36.0–46.0)
Hemoglobin: 11.5 g/dL — ABNORMAL LOW (ref 12.0–15.0)
LYMPHS PCT: 29 %
Lymphs Abs: 3.1 10*3/uL (ref 0.7–4.0)
MCH: 30.3 pg (ref 26.0–34.0)
MCHC: 32.4 g/dL (ref 30.0–36.0)
MCV: 93.7 fL (ref 78.0–100.0)
MONO ABS: 1.3 10*3/uL — AB (ref 0.1–1.0)
Monocytes Relative: 13 %
NEUTROS ABS: 5.8 10*3/uL (ref 1.7–7.7)
Neutrophils Relative %: 54 %
Platelets: 482 10*3/uL — ABNORMAL HIGH (ref 150–400)
RBC: 3.79 MIL/uL — AB (ref 3.87–5.11)
RDW: 13.7 % (ref 11.5–15.5)
WBC: 10.7 10*3/uL — ABNORMAL HIGH (ref 4.0–10.5)

## 2016-12-04 LAB — BASIC METABOLIC PANEL
ANION GAP: 7 (ref 5–15)
BUN: 6 mg/dL (ref 6–20)
CHLORIDE: 101 mmol/L (ref 101–111)
CO2: 28 mmol/L (ref 22–32)
Calcium: 8.3 mg/dL — ABNORMAL LOW (ref 8.9–10.3)
Creatinine, Ser: 0.61 mg/dL (ref 0.44–1.00)
GFR calc Af Amer: >60 mL/min (ref >60)
GFR calc non Af Amer: >60 mL/min (ref >60)
GLUCOSE: 121 mg/dL — AB (ref 65–99)
POTASSIUM: 3.7 mmol/L (ref 3.5–5.1)
Sodium: 136 mmol/L (ref 135–145)

## 2016-12-04 LAB — PROTIME-INR
INR: 0.93
Prothrombin Time: 12.5 s (ref 11.4–15.2)

## 2016-12-04 MED ORDER — HYDROCODONE-ACETAMINOPHEN 5-325 MG PO TABS
1.0000 | ORAL_TABLET | Freq: Once | ORAL | Status: AC
  Administered 2016-12-04: 1 via ORAL

## 2016-12-04 MED ORDER — ONDANSETRON 4 MG PO TBDP
4.0000 mg | ORAL_TABLET | Freq: Once | ORAL | Status: AC
  Administered 2016-12-04: 4 mg via ORAL
  Filled 2016-12-04: qty 1

## 2016-12-04 MED ORDER — MECLIZINE HCL 12.5 MG PO TABS: 12.5000 mg | ORAL_TABLET | Freq: Three times a day (TID) | ORAL | 0 refills | Status: DC | PRN

## 2016-12-04 MED ORDER — HYDROCODONE-ACETAMINOPHEN 5-325 MG PO TABS
ORAL_TABLET | ORAL | Status: AC
  Filled 2016-12-04: qty 1

## 2016-12-04 NOTE — Discharge Instructions (Signed)
Do not bear weight on your Right leg. Make an appointment with Dr. Maureen Ralphs. Return to the ED if you develop worsening pain, weakness, numbness, tingling, or any other concerns.

## 2016-12-04 NOTE — ED Provider Notes (Signed)
[facility] DEPT Provider Note   CSN: [id number] Arrival date & time: 12/03/16  2257     History   Chief Complaint Chief Complaint  Patient presents with  . Knee Pain    HPI Robin Blackburn is a 71 y.o. female.  Patient sent from nursing home with right knee pain and questionable fracture on outpatient x-ray. Patient denies trauma but states she hit her knee while being transported by EMS on June 19. She did not fall or hit her head. She is not on any blood thinners. Complains of pain to her right knee. Denies other injury. She was recently treated for UTI is still on antibiotics. Denies fever. Denies nausea or vomiting. Denies abdominal pain, chest pain or shortness of breath. She's had previous orthopedic surgeries done by Dr. Maureen Ralphs.    The history is provided by the patient and the EMS personnel.  Knee Pain      Past Medical History:  Diagnosis Date  . Acid reflux   . Anxiety   . 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 Depression   . Fibromyalgia   . Hypertension   . Neuropathy   . Rheumatoid arthritis ([place])   . Stroke [facility]) 02/17/14    Patient Active Problem List   Diagnosis Date Noted  . Acute lower UTI 11/25/2016  . Confusion associated with infection (UTI) 11/25/2016  . Urinary tract infection without hematuria 04/02/2016  . UTI (urinary tract infection) 04/02/2016  . 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 Acute pain of left knee 12/18/2015  . Osteoporosis 11/01/2015  . Vitamin D deficiency 10/11/2015  . History of fracture of left ankle 09/28/2015  . Chronic pain in left foot 09/28/2015  . Fibromyalgia 09/28/2015  . Rash and nonspecific skin eruption 07/17/2015  . Cerumen impaction 05/30/2015  . Edema 04/16/2015  . Cellulitis 04/16/2015  . Mouth pain 04/08/2015  . Pain in joint, lower leg 04/08/2015  . GERD (gastroesophageal reflux disease) 04/05/2015  . Occult blood positive stool 01/17/2015    . Diarrhea 01/05/2015  . Chronic pain syndrome 12/02/2014  . Pain in joint, upper arm 10/29/2014  . Nasal congestion 10/29/2014  . Allergic rhinitis 08/20/2014  . Conjunctivitis 08/08/2014  . Dysuria 06/27/2014  . Back pain, lumbosacral 06/23/2014  . Sinusitis, chronic 06/17/2014  . Fall 05/24/2014  . H/O: CVA (cerebrovascular accident) 05/24/2014  . HLD (hyperlipidemia) 05/24/2014  . Physical deconditioning 05/24/2014  . Fracture of fifth metatarsal bone of right foot 03/06/2014  . Right sided weakness 02/26/2014  . Leukocytosis 02/20/2014  . Rheumatoid arthritis ([place]) 02/20/2014  . CVA (cerebral infarction) 02/19/2014  . Right ankle sprain 02/19/2014  . Contusion of right foot 02/19/2014  . Essential hypertension 02/19/2014  . Neuropathy 02/19/2014  . Anxiety 02/19/2014  . Hyponatremia 02/19/2014  . Hypokalemia 02/19/2014    Past Surgical History:  Procedure Laterality Date  . ABDOMINAL HYSTERECTOMY    . ANKLE RECONSTRUCTION    . APPENDECTOMY    . BACK SURGERY    . CHOLECYSTECTOMY    . KNEE SURGERY      OB History    No data available       Home Medications    Prior to Admission medications   Medication Sig Start Date End Date Taking? Authorizing Provider  ALPRAZolam Duanne Moron) 0.5 MG tablet Take 1 tablet (0.5 mg total) by mouth 3 (three) times daily as needed for anxiety. 11/28/16   Arrien, Jimmy Picket, MD  busPIRone [place])  5 MG tablet Take 5 mg by mouth 2 (two) times daily.    [provider]  Cholecalciferol 5000 units TABS Will take 5,000 unit along with 2,000 unit daily for a 7,000 unit daily    [provider]  clopidogrel (PLAVIX) 75 MG tablet Take 75 mg by mouth at bedtime. 05/03/14   [provider]  desloratadine (CLARINEX) 5 MG tablet Take 5 mg by mouth daily.    [provider]  diclofenac sodium (VOLTAREN) 1 % GEL Apply 2 g topically 4 (four) times daily. Apply to the right knee. 11/28/16   Arrien, Jimmy Picket, MD  dicyclomine (BENTYL) 20 MG tablet Take 20 mg by mouth every 6 (six) hours as needed. Abdominal cramps 01/31/14   [provider]  docusate sodium (COLACE) 100 MG capsule Take 100 mg by mouth 2 (two) times daily as needed for mild constipation.     [provider]  feeding supplement, ENSURE ENLIVE, (ENSURE ENLIVE) LIQD Take 237 mLs by mouth 2 (two) times daily between meals. 11/28/16   Arrien, Jimmy Picket, MD  hydroxychloroquine (PLAQUENIL) 200 MG tablet TAKE ONE TABLET BY MOUTH ONCE DAILY. 10/16/16   Panwala, Naitik, PA-C  ibuprofen (ADVIL,MOTRIN) 200 MG tablet Take 1 tablet (200 mg total) by mouth every 8 (eight) hours as needed for moderate pain (Right knee pain.). 11/28/16   Arrien, Jimmy Picket, MD  lansoprazole (PREVACID) 30 MG capsule Take 30 mg by mouth 2 (two) times daily. 02/17/14   [provider]  loperamide (IMODIUM A-D) 2 MG tablet Take 2 mg by mouth 4 (four) times daily as needed for diarrhea or loose stools.    [provider]  metoprolol tartrate (LOPRESSOR) 25 MG tablet Take 1 tablet (25 mg total) by mouth 2 (two) times daily. 09/28/15   Hendricks Limes, MD  ondansetron (ZOFRAN) 4 MG tablet Take 4 mg by mouth every 6 (six) hours as needed. Give with plaquenil daily at 10 am. Give 4 mg as needed for N / V    [provider]  PARoxetine (PAXIL) 40 MG tablet Take 1 tablet by mouth daily. 12/14/15   [provider]  Propylene Glycol 0.6 % SOLN Place 1 drop into both eyes daily. At noon.    [provider]  RESTASIS 0.05 % ophthalmic emulsion Place 1 drop into both eyes 2 (two) times daily. 01/31/14   [provider]  Simethicone 125 MG TABS Chew 125 mg by mouth at bedtime. And as needed.    [provider]  sodium chloride (OCEAN) 0.65 % SOLN nasal spray Place 1 spray into both nostrils as needed for congestion. 02/22/14   Rexene Alberts, MD  traZODone (DESYREL) 50 MG tablet Take 50 mg by mouth at  bedtime.    [provider]    Family History Family History  Problem Relation Age of Onset  . Hypertension Father   . Transient ischemic attack Father   . Hypertension Brother   . CVA Maternal Grandfather   . Leukemia Brother     Social History Social History  Substance Use Topics  . Smoking status: Never Smoker  . Smokeless tobacco: Never Used  . Alcohol use No     Allergies   Demerol [meperidine]; Hydromorphone; Keflex [cephalexin]; Penicillins; Sulfa antibiotics; Bisphosphonates; Fentanyl; Iohexol; Percocet [oxycodone-acetaminophen]; Shellfish allergy; Terramycin [oxytetracycline]; Ciprofloxacin; Levaquin [levofloxacin in d5w]; and Morphine and related   Review of Systems Review of Systems  Constitutional: Negative for activity change and appetite change.  HENT: Negative for congestion and rhinorrhea.   Respiratory: Negative for cough, chest tightness and shortness of breath.   Cardiovascular: Negative for chest pain.  Gastrointestinal: Negative for abdominal pain, nausea and vomiting.  Genitourinary: Negative for dysuria, hematuria, vaginal bleeding and vaginal discharge.  Musculoskeletal: Positive for arthralgias and myalgias.  Skin: Negative for rash.  Neurological: Negative for dizziness, weakness and headaches.    all other systems are negative except as noted in the HPI and PMH.    Physical Exam Updated Vital Signs BP (!) 107/51   Pulse 80   Temp 98.2 F (36.8 C)   Resp 17   Ht 5\' 6"  (1.676 m)   Wt 89.8 kg (198 lb)   SpO2 96%   BMI 31.96 kg/m   Physical Exam  Constitutional: She is oriented to person, place, and time. She appears well-developed and well-nourished. No distress.  HENT:  Head: Normocephalic and atraumatic.  Mouth/Throat: Oropharynx is clear and moist. No oropharyngeal exudate.  Eyes: Conjunctivae and EOM are normal. Pupils are equal, round, and reactive to light.  Neck: Normal range of motion. Neck supple.  No meningismus.    Cardiovascular: Normal rate, regular rhythm, normal heart sounds and intact distal pulses.   No murmur heard. Pulmonary/Chest: Effort normal and breath sounds normal. No respiratory distress. She exhibits no tenderness.  Abdominal: Soft. There is no tenderness. There is no rebound and no guarding.  Musculoskeletal: Normal range of motion. She exhibits edema and tenderness.  Edema and tenderness to right knee. Reduced range of motion. Intact DP and PT pulses. Compartments soft.  Neurological: She is alert and oriented to person, place, and time. No cranial nerve deficit. She exhibits normal muscle tone. Coordination normal.   5/5 strength throughout. CN 2-12 intact.Equal grip strength.   Skin: Skin is warm. Capillary refill takes less than 2 seconds.  Psychiatric: She has a normal mood and affect. Her behavior is normal.  Nursing note and vitals reviewed.    ED Treatments / Results  Labs (all labs ordered are listed, but only abnormal results are displayed) Labs Reviewed  CBC WITH DIFFERENTIAL/PLATELET - Abnormal; Notable for the following:       Result Value   WBC 10.7 (*)    RBC 3.79 (*)    Hemoglobin 11.5 (*)    HCT 35.5 (*)    Platelets 482 (*)    Monocytes Absolute 1.3 (*)    All other components within normal limits  BASIC METABOLIC PANEL - Abnormal; Notable for the following:    Glucose, Bld 121 (*)    Calcium 8.3 (*)    All other components within normal limits  PROTIME-INR    EKG  EKG Interpretation None       Radiology Dg Tibia/fibula Right  Result Date: 12/04/2016 CLINICAL DATA:  Right knee pain. EXAM: RIGHT TIBIA AND FIBULA - 2 VIEW COMPARISON:  None. FINDINGS: Proximal metaphyseal fracture is marginally visible in top of the image. The remainder of the tibia and fibula are intact. IMPRESSION: Proximal medial metaphyseal fracture of the tibia, seen to better advantage on accompanying knee radiographs. The remainder of the tibia and fibula are intact.  Electronically Signed   By: Andreas Newport M.D.   On: 12/04/2016 01:08   Dg Ankle Complete Right  Result Date: 12/04/2016 CLINICAL DATA:  71 y/o  F; ankle pain. EXAM: RIGHT ANKLE - COMPLETE 3+ VIEW COMPARISON:  None. FINDINGS: There is no evidence of fracture, dislocation, or joint effusion. There is no evidence of  arthropathy or other focal bone abnormality. Bones are demineralized. IMPRESSION: No acute fracture or dislocation identified. Bones are demineralized. Electronically Signed   By: Kristine Garbe M.D.   On: 12/04/2016 03:51   Dg Knee Complete 4 Views Right  Result Date: 12/04/2016 CLINICAL DATA:  Right knee pain EXAM: RIGHT KNEE - COMPLETE 4+ VIEW COMPARISON:  None. FINDINGS: There is a fracture of the medial metaphysis, probably intra-articular. The fracture appears to be subacute. Moderate arthritic changes are also present at the knee. No bone lesion or bony destruction to suggest a pathologic basis for the fracture IMPRESSION: Medial metaphyseal tibial fracture, probably intra-articular at the knee. Electronically Signed   By: Andreas Newport M.D.   On: 12/04/2016 01:09    Procedures Procedures (including critical care time)  Medications Ordered in ED Medications - No data to display   Initial Impression / Assessment and Plan / ED Course  I have reviewed the triage vital signs and the nursing notes.  Pertinent labs & imaging results that were available during my care of the patient were reviewed by me and considered in my medical decision making (see chart for details).     Patient with subacute right metaphysis fracture. She is neurovascularly intact. Compartments are soft. She does not walk much at baseline.  Discussed with Dr. Aline Brochure of orthopedics. He feels patient can be followed as an outpatient. Recommends knee immobilizer and nonweightbearing.  D/w Dr. Doran Durand on call for Dr. Maureen Ralphs who agrees with plan of knee immoblizier, nonweight bearing and  outpatient followup.  Knee immobilizer given.  PUlses intact.  No evidence of compartment syndrome. Pain improved.  Patient finished IM rocephin for klebsiella UTI, cultures were sensitive.  Patient appears stable for return to her nursing facility. She knows to not bear weight on RLE. Follow up with Dr. Maureen Ralphs. Return precautions discussed.   Final Clinical Impressions(s) / ED Diagnoses   Final diagnoses:  Closed fracture of proximal end of right tibia, unspecified fracture morphology, initial encounter    New Prescriptions New Prescriptions   No medications on file     Ezequiel Essex, MD 12/04/16 [phone]

## 2016-12-08 ENCOUNTER — Other Ambulatory Visit (HOSPITAL_COMMUNITY): Payer: Self-pay | Admitting: Internal Medicine

## 2016-12-08 DIAGNOSIS — Z780 Asymptomatic menopausal state: Secondary | ICD-10-CM

## 2016-12-18 ENCOUNTER — Ambulatory Visit (HOSPITAL_COMMUNITY)
Admission: RE | Admit: 2016-12-18 | Discharge: 2016-12-18 | Disposition: A | Discharge status: Home / Self Care | Payer: Medicare Other | Source: Ambulatory Visit | Attending: Internal Medicine | Admitting: Internal Medicine

## 2016-12-18 DIAGNOSIS — M810 Age-related osteoporosis without current pathological fracture: Secondary | ICD-10-CM | POA: Diagnosis not present

## 2016-12-18 DIAGNOSIS — M069 Rheumatoid arthritis, unspecified: Secondary | ICD-10-CM | POA: Insufficient documentation

## 2016-12-18 DIAGNOSIS — Z780 Asymptomatic menopausal state: Secondary | ICD-10-CM | POA: Diagnosis present

## 2017-07-09 ENCOUNTER — Ambulatory Visit (INDEPENDENT_AMBULATORY_CARE_PROVIDER_SITE_OTHER): Payer: Self-pay | Admitting: Otolaryngology

## 2017-07-28 ENCOUNTER — Ambulatory Visit: Payer: Self-pay | Admitting: Urology

## 2018-07-30 ENCOUNTER — Observation Stay (HOSPITAL_COMMUNITY): Payer: Medicare Other

## 2018-07-30 ENCOUNTER — Observation Stay (HOSPITAL_COMMUNITY)
Admission: EM | Admit: 2018-07-30 | Discharge: 2018-08-01 | Disposition: A | Discharge status: Home / Self Care | Payer: Medicare Other | Attending: Internal Medicine | Admitting: Internal Medicine

## 2018-07-30 ENCOUNTER — Encounter (HOSPITAL_COMMUNITY): Payer: Self-pay

## 2018-07-30 DIAGNOSIS — R410 Disorientation, unspecified: Secondary | ICD-10-CM | POA: Diagnosis not present

## 2018-07-30 DIAGNOSIS — F419 Anxiety disorder, unspecified: Secondary | ICD-10-CM | POA: Diagnosis not present

## 2018-07-30 DIAGNOSIS — G9349 Other encephalopathy: Secondary | ICD-10-CM | POA: Insufficient documentation

## 2018-07-30 DIAGNOSIS — G9341 Metabolic encephalopathy: Secondary | ICD-10-CM | POA: Insufficient documentation

## 2018-07-30 DIAGNOSIS — E872 Acidosis, unspecified: Secondary | ICD-10-CM

## 2018-07-30 DIAGNOSIS — R21 Rash and other nonspecific skin eruption: Secondary | ICD-10-CM | POA: Diagnosis not present

## 2018-07-30 DIAGNOSIS — R532 Functional quadriplegia: Secondary | ICD-10-CM | POA: Diagnosis not present

## 2018-07-30 DIAGNOSIS — R3915 Urgency of urination: Secondary | ICD-10-CM | POA: Diagnosis present

## 2018-07-30 DIAGNOSIS — I10 Essential (primary) hypertension: Secondary | ICD-10-CM | POA: Diagnosis not present

## 2018-07-30 DIAGNOSIS — M79672 Pain in left foot: Secondary | ICD-10-CM

## 2018-07-30 DIAGNOSIS — Z8673 Personal history of transient ischemic attack (TIA), and cerebral infarction without residual deficits: Secondary | ICD-10-CM | POA: Insufficient documentation

## 2018-07-30 DIAGNOSIS — G929 Unspecified toxic encephalopathy: Secondary | ICD-10-CM

## 2018-07-30 DIAGNOSIS — K219 Gastro-esophageal reflux disease without esophagitis: Secondary | ICD-10-CM | POA: Diagnosis not present

## 2018-07-30 DIAGNOSIS — Z7901 Long term (current) use of anticoagulants: Secondary | ICD-10-CM | POA: Diagnosis not present

## 2018-07-30 DIAGNOSIS — G8929 Other chronic pain: Secondary | ICD-10-CM | POA: Insufficient documentation

## 2018-07-30 DIAGNOSIS — Z79899 Other long term (current) drug therapy: Secondary | ICD-10-CM | POA: Insufficient documentation

## 2018-07-30 DIAGNOSIS — N390 Urinary tract infection, site not specified: Secondary | ICD-10-CM | POA: Diagnosis not present

## 2018-07-30 DIAGNOSIS — E860 Dehydration: Secondary | ICD-10-CM

## 2018-07-30 DIAGNOSIS — R05 Cough: Secondary | ICD-10-CM

## 2018-07-30 DIAGNOSIS — M069 Rheumatoid arthritis, unspecified: Secondary | ICD-10-CM | POA: Insufficient documentation

## 2018-07-30 DIAGNOSIS — A419 Sepsis, unspecified organism: Secondary | ICD-10-CM

## 2018-07-30 DIAGNOSIS — G92 Toxic encephalopathy: Secondary | ICD-10-CM

## 2018-07-30 DIAGNOSIS — R059 Cough, unspecified: Secondary | ICD-10-CM

## 2018-07-30 DIAGNOSIS — R6510 Systemic inflammatory response syndrome (SIRS) of non-infectious origin without acute organ dysfunction: Secondary | ICD-10-CM

## 2018-07-30 LAB — URINALYSIS, ROUTINE W REFLEX MICROSCOPIC
Bilirubin Urine: NEGATIVE
Glucose, UA: NEGATIVE mg/dL
Ketones, ur: NEGATIVE mg/dL
NITRITE: POSITIVE — AB
Protein, ur: NEGATIVE mg/dL
RBC / HPF: >50 RBC/hpf — ABNORMAL HIGH (ref 0–5)
SPECIFIC GRAVITY, URINE: 1.015 (ref 1.005–1.030)
pH: 5 (ref 5.0–8.0)

## 2018-07-30 LAB — CBC WITH DIFFERENTIAL/PLATELET
ABS IMMATURE GRANULOCYTES: 0.15 10*3/uL — AB (ref 0.00–0.07)
BASOS ABS: 0.1 10*3/uL (ref 0.0–0.1)
BASOS PCT: 1 %
Eosinophils Absolute: 0.2 10*3/uL (ref 0.0–0.5)
Eosinophils Relative: 1 %
HCT: 45 % (ref 36.0–46.0)
Hemoglobin: 14.1 g/dL (ref 12.0–15.0)
Immature Granulocytes: 1 %
LYMPHS PCT: 18 %
Lymphs Abs: 2.9 10*3/uL (ref 0.7–4.0)
MCH: 29.6 pg (ref 26.0–34.0)
MCHC: 31.3 g/dL (ref 30.0–36.0)
MCV: 94.3 fL (ref 80.0–100.0)
MONO ABS: 1.1 10*3/uL — AB (ref 0.1–1.0)
Monocytes Relative: 7 %
NEUTROS ABS: 11.5 10*3/uL — AB (ref 1.7–7.7)
NRBC: 0 % (ref 0.0–0.2)
Neutrophils Relative %: 72 %
PLATELETS: 467 10*3/uL — AB (ref 150–400)
RBC: 4.77 MIL/uL (ref 3.87–5.11)
RDW: 13.7 % (ref 11.5–15.5)
WBC: 15.9 10*3/uL — AB (ref 4.0–10.5)

## 2018-07-30 LAB — LACTIC ACID, PLASMA
LACTIC ACID, VENOUS: 2.5 mmol/L — AB (ref 0.5–1.9)
Lactic Acid, Venous: 2.8 mmol/L (ref 0.5–1.9)
Lactic Acid, Venous: 2.1 mmol/L (ref 0.5–1.9)

## 2018-07-30 LAB — BASIC METABOLIC PANEL
Anion gap: 13 (ref 5–15)
BUN: 9 mg/dL (ref 8–23)
CO2: 23 mmol/L (ref 22–32)
CREATININE: 0.89 mg/dL (ref 0.44–1.00)
Calcium: 9 mg/dL (ref 8.9–10.3)
Chloride: 102 mmol/L (ref 98–111)
Glucose, Bld: 150 mg/dL — ABNORMAL HIGH (ref 70–99)
Potassium: 3.7 mmol/L (ref 3.5–5.1)
Sodium: 138 mmol/L (ref 135–145)

## 2018-07-30 MED ORDER — SODIUM CHLORIDE 0.9 % IV SOLN
INTRAVENOUS | Status: DC
  Administered 2018-07-30: 13:00:00 via INTRAVENOUS

## 2018-07-30 MED ORDER — ONDANSETRON HCL 4 MG/2ML IJ SOLN
4.0000 mg | Freq: Once | INTRAMUSCULAR | Status: AC
  Administered 2018-07-30: 4 mg via INTRAVENOUS
  Filled 2018-07-30: qty 2

## 2018-07-30 MED ORDER — BUSPIRONE HCL 5 MG PO TABS
15.0000 mg | ORAL_TABLET | Freq: Two times a day (BID) | ORAL | Status: DC
  Administered 2018-07-30 – 2018-08-01 (×4): 15 mg via ORAL
  Filled 2018-07-30 (×4): qty 3

## 2018-07-30 MED ORDER — SODIUM CHLORIDE 0.9 % IV SOLN
1.0000 g | Freq: Once | INTRAVENOUS | Status: AC
  Administered 2018-07-30: 1 g via INTRAVENOUS
  Filled 2018-07-30: qty 1

## 2018-07-30 MED ORDER — SODIUM CHLORIDE 0.9 % IV SOLN
1.0000 g | INTRAVENOUS | Status: DC
  Administered 2018-07-30 – 2018-07-31 (×2): 1 g via INTRAVENOUS
  Filled 2018-07-30 (×5): qty 10

## 2018-07-30 MED ORDER — SODIUM CHLORIDE 0.9 % IV BOLUS
500.0000 mL | Freq: Once | INTRAVENOUS | Status: AC
  Administered 2018-07-30: 500 mL via INTRAVENOUS

## 2018-07-30 MED ORDER — POLYETHYLENE GLYCOL 3350 17 G PO PACK: 17.0000 g | PACK | Freq: Every day | ORAL | Status: DC | PRN

## 2018-07-30 MED ORDER — SODIUM CHLORIDE 0.9 % IV BOLUS
2200.0000 mL | Freq: Once | INTRAVENOUS | Status: AC
  Administered 2018-07-30: 2200 mL via INTRAVENOUS

## 2018-07-30 MED ORDER — ALPRAZOLAM 0.5 MG PO TABS
0.5000 mg | ORAL_TABLET | Freq: Once | ORAL | Status: AC
  Administered 2018-07-30: 0.5 mg via ORAL
  Filled 2018-07-30: qty 1

## 2018-07-30 MED ORDER — SODIUM CHLORIDE 0.9 % IV SOLN
INTRAVENOUS | Status: AC
  Filled 2018-07-30: qty 2

## 2018-07-30 MED ORDER — PANTOPRAZOLE SODIUM 40 MG PO TBEC
40.0000 mg | DELAYED_RELEASE_TABLET | Freq: Every day | ORAL | Status: DC
  Administered 2018-07-30 – 2018-08-01 (×3): 40 mg via ORAL
  Filled 2018-07-30 (×3): qty 1

## 2018-07-30 MED ORDER — ALPRAZOLAM 0.5 MG PO TABS
0.5000 mg | ORAL_TABLET | Freq: Two times a day (BID) | ORAL | Status: DC
  Administered 2018-07-30 – 2018-08-01 (×4): 0.5 mg via ORAL
  Filled 2018-07-30 (×4): qty 1

## 2018-07-30 MED ORDER — DICYCLOMINE HCL 10 MG PO CAPS
20.0000 mg | ORAL_CAPSULE | Freq: Four times a day (QID) | ORAL | Status: DC
  Administered 2018-07-30 – 2018-08-01 (×8): 20 mg via ORAL
  Filled 2018-07-30 (×8): qty 2

## 2018-07-30 MED ORDER — ONDANSETRON HCL 4 MG/2ML IJ SOLN
4.0000 mg | Freq: Four times a day (QID) | INTRAMUSCULAR | Status: DC | PRN
  Administered 2018-08-01: 4 mg via INTRAVENOUS
  Filled 2018-07-30: qty 2

## 2018-07-30 MED ORDER — ENOXAPARIN SODIUM 40 MG/0.4ML ~~LOC~~ SOLN
40.0000 mg | SUBCUTANEOUS | Status: DC
  Administered 2018-07-30 – 2018-07-31 (×2): 40 mg via SUBCUTANEOUS
  Filled 2018-07-30 (×2): qty 0.4

## 2018-07-30 MED ORDER — ONDANSETRON HCL 4 MG PO TABS
4.0000 mg | ORAL_TABLET | Freq: Four times a day (QID) | ORAL | Status: DC | PRN
  Administered 2018-08-01: 4 mg via ORAL
  Filled 2018-07-30: qty 1

## 2018-07-30 MED ORDER — LORATADINE 10 MG PO TABS
10.0000 mg | ORAL_TABLET | Freq: Every day | ORAL | Status: DC
  Administered 2018-07-31 – 2018-08-01 (×2): 10 mg via ORAL
  Filled 2018-07-30 (×3): qty 1

## 2018-07-30 MED ORDER — CLOPIDOGREL BISULFATE 75 MG PO TABS
75.0000 mg | ORAL_TABLET | Freq: Every day | ORAL | Status: DC
  Administered 2018-07-30 – 2018-07-31 (×2): 75 mg via ORAL
  Filled 2018-07-30 (×2): qty 1

## 2018-07-30 MED ORDER — TRIAMCINOLONE ACETONIDE 0.5 % EX OINT
TOPICAL_OINTMENT | Freq: Two times a day (BID) | CUTANEOUS | Status: DC
  Filled 2018-07-30 (×2): qty 15

## 2018-07-30 MED ORDER — PAROXETINE HCL 10 MG PO TABS
10.0000 mg | ORAL_TABLET | Freq: Every day | ORAL | Status: DC
  Administered 2018-07-30 – 2018-07-31 (×2): 10 mg via ORAL
  Filled 2018-07-30 (×2): qty 1

## 2018-07-30 MED ORDER — METOPROLOL TARTRATE 25 MG PO TABS
25.0000 mg | ORAL_TABLET | Freq: Two times a day (BID) | ORAL | Status: DC
  Administered 2018-07-30 – 2018-08-01 (×4): 25 mg via ORAL
  Filled 2018-07-30 (×4): qty 1

## 2018-07-30 MED ORDER — POTASSIUM CHLORIDE IN NACL 20-0.9 MEQ/L-% IV SOLN
INTRAVENOUS | Status: AC
  Administered 2018-07-30 – 2018-07-31 (×2): via INTRAVENOUS

## 2018-07-30 MED ORDER — TIZANIDINE HCL 2 MG PO TABS
2.0000 mg | ORAL_TABLET | Freq: Three times a day (TID) | ORAL | Status: DC | PRN
  Administered 2018-07-30: 4 mg via ORAL
  Administered 2018-07-31: 2 mg via ORAL
  Administered 2018-07-31: 4 mg via ORAL
  Administered 2018-08-01: 2 mg via ORAL
  Filled 2018-07-30 (×2): qty 2
  Filled 2018-07-30 (×2): qty 1

## 2018-07-30 MED ORDER — ACETAMINOPHEN 325 MG PO TABS
650.0000 mg | ORAL_TABLET | Freq: Once | ORAL | Status: AC
  Administered 2018-07-30: 650 mg via ORAL
  Filled 2018-07-30: qty 2

## 2018-07-30 NOTE — ED Notes (Signed)
CRITICAL VALUE ALERT  Critical Value:  2.8  Date & Time Notied:  07/30/2018 @ 4431  Provider Notified: Eulis Foster  Orders Received/Actions taken: N/A

## 2018-07-30 NOTE — ED Notes (Signed)
Date and time results received: 07/30/18 5:42 PM  (use smartphrase ".now" to insert current time)  Test: Lactic Acid Critical Value: 2.1  Name of Provider Notified: Emokpae  Orders Received? Or Actions Taken?: Orders Received - See Orders for details

## 2018-07-30 NOTE — ED Triage Notes (Signed)
Caregiver reports pt has been hallucinations for 2 days as well as urinary pressure. EMS reports strong urine smell. Pt also has areas to left ankle ares. Pt came in by EMS. Pt lives at home

## 2018-07-30 NOTE — ED Notes (Signed)
CRITICAL VALUE ALERT  Critical Value: lactic 2.5  Date & Time Notied: 07/30/18 1520  Provider Notified: Eulis Foster  Orders Received/Actions taken:

## 2018-07-30 NOTE — H&P (Addendum)
History and Physical    Robin Blackburn:[id number] DOB: Jul 19, 1945 DOA: 07/30/2018  PCP: Redmond School, MD   Patient coming from: Home  I have personally briefly reviewed patient's old medical records in Robin Blackburn  Chief Complaint: Hallucinations, Urinary pressure  HPI: Robin Blackburn is a 73 y.o. female with medical history significant for CVA, HTN, Rheumatoid Arthritis who was brought to the ED with complaints of hallucinations, confusion over the past 2 days.  Patient's caregiver is at bedside and helps with the history.  It is alert and oriented to person and place but some answers to her questions are not appropriate.  Caregivers reports worsening confusion and disorientation over the past 2 days, similar occurence  2018 also in the setting of UTI.  Patient reported seeing many rats in her bed, on her dresser. Patient also reports urinary pressure, but cannot confirm or refute burning with urination.  No fever or chills noted at home.  Patient has caregivers that come in for about 4 hours a day, they clean, eat, change patient will give her food to eat otherwise patient is lying in bed all day. Patient is bedbound, has not walked in over a year.  Patient is unstable on her feet and afraid to fall hence she lies in bed all day. She wears diapers. Patient denies difficulty breathing, she has a cough -worse at night, no vomiting or loose stools, chronic back problems unchanged. Patient has declined nursing home placement, as suggested by her primary care provider.  ED Course: Blood pressure 150s to 170s, otherwise stable vitals.  WBC 15.9.  Unremarkable BMP.  UA with positive nitrites and large leukocytes, WBCs, many bacteria.  Lactic acidosis 2.8 > 2.5, 2.7 L bolus given.  Urine and blood cultures Ordered.  Patient given IV aztreonam as she has an extensive allergy list.  Review of Systems: As per HPI all other systems reviewed and negative.  Past Medical History:  Diagnosis Date    . Acid reflux   . Anxiety   . 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 Depression   . Fibromyalgia   . Hypertension   . Neuropathy   . Rheumatoid arthritis ([place])   . Stroke [facility]) 02/17/14    Past Surgical History:  Procedure Laterality Date  . ABDOMINAL HYSTERECTOMY    . ANKLE RECONSTRUCTION    . APPENDECTOMY    . BACK SURGERY    . CHOLECYSTECTOMY    . KNEE SURGERY      reports that she has never smoked. She has never used smokeless tobacco. She reports that she does not drink alcohol or use drugs.  Allergies  Allergen Reactions  . Demerol [Meperidine] Anaphylaxis  . Hydromorphone Anaphylaxis  . Keflex [Cephalexin] Nausea And Vomiting  . Penicillins Anaphylaxis    Has patient had a PCN reaction causing immediate rash, facial/tongue/throat swelling, SOB or lightheadedness with hypotension: Yes Has patient had a PCN reaction causing severe rash involving mucus membranes or skin necrosis: No Has patient had a PCN reaction that required hospitalization No Has patient had a PCN reaction occurring within the last 10 years: No If all of the above answers are "NO", then may proceed with Cephalosporin use.   . Sulfa Antibiotics Nausea And Vomiting  . Bisphosphonates     GI intolerance  . Fentanyl Other (See Comments)    "felt like I had demons in my head"  . Iohexol   . Percocet [Oxycodone-Acetaminophen] Swelling  Mouth swelling.  . Shellfish Allergy     Glucosamine not an option  . Terramycin [Oxytetracycline] Nausea And Vomiting  . Ciprofloxacin Nausea Only and Rash  . Levaquin [Levofloxacin In D5w] Nausea And Vomiting and Rash  . Morphine And Related Rash    Family History  Problem Relation Age of Onset  . Hypertension Father   . Transient ischemic attack Father   . Hypertension Brother   . CVA Maternal Grandfather   . Leukemia Brother     Prior to Admission medications   Medication Sig Start Date End Date Taking? Authorizing Provider   ALPRAZolam Duanne Moron) 0.5 MG tablet Take 1 tablet (0.5 mg total) by mouth 3 (three) times daily as needed for anxiety. 11/28/16   Arrien, Jimmy Picket, MD  busPIRone (BUSPAR) 5 MG tablet Take 5 mg by mouth 2 (two) times daily.    [provider]  Cholecalciferol 5000 units TABS Will take 5,000 unit along with 2,000 unit daily for a 7,000 unit daily    [provider]  clopidogrel (PLAVIX) 75 MG tablet Take 75 mg by mouth at bedtime. 05/03/14   [provider]  desloratadine (CLARINEX) 5 MG tablet Take 5 mg by mouth daily.    [provider]  diclofenac sodium (VOLTAREN) 1 % GEL Apply 2 g topically 4 (four) times daily. Apply to the right knee. 11/28/16   Arrien, Jimmy Picket, MD  dicyclomine (BENTYL) 20 MG tablet Take 20 mg by mouth every 6 (six) hours as needed. Abdominal cramps 01/31/14   [provider]  docusate sodium (COLACE) 100 MG capsule Take 100 mg by mouth 2 (two) times daily as needed for mild constipation.     [provider]  feeding supplement, ENSURE ENLIVE, (ENSURE ENLIVE) LIQD Take 237 mLs by mouth 2 (two) times daily between meals. 11/28/16   Arrien, Jimmy Picket, MD  hydroxychloroquine (PLAQUENIL) 200 MG tablet TAKE ONE TABLET BY MOUTH ONCE DAILY. 10/16/16   Panwala, Naitik, PA-C  ibuprofen (ADVIL,MOTRIN) 200 MG tablet Take 1 tablet (200 mg total) by mouth every 8 (eight) hours as needed for moderate pain (Right knee pain.). 11/28/16   Arrien, Jimmy Picket, MD  lansoprazole (PREVACID) 30 MG capsule Take 30 mg by mouth 2 (two) times daily. 02/17/14   [provider]  loperamide (IMODIUM A-D) 2 MG tablet Take 2 mg by mouth 4 (four) times daily as needed for diarrhea or loose stools.    [provider]  meclizine (ANTIVERT) 12.5 MG tablet Take 1 tablet (12.5 mg total) by mouth 3 (three) times daily as needed for dizziness. 12/04/16   Rancour, Annie Main, MD  metoprolol tartrate (LOPRESSOR) 25 MG tablet Take 1  tablet (25 mg total) by mouth 2 (two) times daily. 09/28/15   Hendricks Limes, MD  ondansetron (ZOFRAN) 4 MG tablet Take 4 mg by mouth every 6 (six) hours as needed. Give with plaquenil daily at 10 am. Give 4 mg as needed for N / V    [provider]  PARoxetine (PAXIL) 40 MG tablet Take 1 tablet by mouth daily. 12/14/15   [provider]  Propylene Glycol 0.6 % SOLN Place 1 drop into both eyes daily. At noon.    [provider]  RESTASIS 0.05 % ophthalmic emulsion Place 1 drop into both eyes 2 (two) times daily. 01/31/14   [provider]  Simethicone 125 MG TABS Chew 125 mg by mouth at bedtime. And as needed.    [provider]  sodium chloride (OCEAN) 0.65 % SOLN nasal spray Place 1 spray into both nostrils as needed for congestion. 02/22/14   Rexene Alberts, MD  traZODone (DESYREL) 50 MG tablet Take 50 mg by mouth at bedtime.    [provider]    Physical Exam: Vitals:   07/30/18 1400 07/30/18 1430 07/30/18 1500 07/30/18 1545  BP: (!) 144/76 (!) 157/88 (!) 175/93   Pulse: 91 89 86 89  Resp:      Temp:      TempSrc:      SpO2: 97% 96% 97% 100%  Weight:      Height:        Constitutional: NAD, calm, comfortable Vitals:   07/30/18 1400 07/30/18 1430 07/30/18 1500 07/30/18 1545  BP: (!) 144/76 (!) 157/88 (!) 175/93   Pulse: 91 89 86 89  Resp:      Temp:      TempSrc:      SpO2: 97% 96% 97% 100%  Weight:      Height:       Eyes: PERRL, lids and conjunctivae normal ENMT: Mucous membranes are dry. Posterior pharynx clear of any exudate or lesions.  Neck: normal, supple, no masses, no thyromegaly Respiratory: clear to auscultation bilaterally, no wheezing, no crackles. Normal respiratory effort. No accessory muscle use.  Cardiovascular: Regular rate and rhythm, no murmurs / rubs / gallops. No extremity edema. 2+ pedal pulses. No carotid bruits.  Abdomen: Mild lower right sided abdominal pain, patient says she gets with UTI, , no  masses palpated. No hepatosplenomegaly. Bowel sounds positive. No appreciable CVA tenderness on exam. Musculoskeletal: no clubbing / cyanosis. No joint deformity upper and lower extremities. Good ROM, no contractures. Normal muscle tone.  Skin: Scaly dry well circumscribed erythematous rash with some scabs on medial to anterior surface of left ankle, differential warmth, nontender- present for over 6 months per care giver. Neurologic: CN 2-12 grossly intact.  Strength 4+ /5 in all 4.  Psychiatric: Intermittently confused speech, alert and oriented to person and place  Labs on Admission: I have personally reviewed following labs and imaging studies  CBC: Recent Labs  Lab 07/30/18 1200  WBC 15.9*  NEUTROABS 11.5*  HGB 14.1  HCT 45.0  MCV 94.3  PLT 086*   Basic Metabolic Panel: Recent Labs  Lab 07/30/18 1200  NA 138  K 3.7  CL 102  CO2 23  GLUCOSE 150*  BUN 9  CREATININE 0.89  CALCIUM 9.0   Urine analysis:    Component Value Date/Time   COLORURINE YELLOW 07/30/2018 1235   APPEARANCEUR HAZY (A) 07/30/2018 1235   LABSPEC 1.015 07/30/2018 1235   PHURINE 5.0 07/30/2018 1235   GLUCOSEU NEGATIVE 07/30/2018 1235   HGBUR LARGE (A) 07/30/2018 1235   BILIRUBINUR NEGATIVE 07/30/2018 [place] 07/30/2018 1235   PROTEINUR NEGATIVE 07/30/2018 1235   UROBILINOGEN 0.2 04/10/2015 2212   NITRITE POSITIVE (A) 07/30/2018 1235   LEUKOCYTESUR LARGE (A) 07/30/2018 1235    Radiological Exams on Admission: No results found.  EKG: Independently reviewed.  Regular, appears to be in sinus rhythm.  Artifacts.  No appreciable change from prior EKG.  QTC 456.  Assessment/Plan Active Problems:   UTI (urinary tract infection)  Metabolic encephalopathy - confusion, hallucinations likely 2/2 UTI.  UA with nitrites leukocytes WBCs and many bacteria.  Leukocytosis 10.9.  Lactic acidosis 2.8 > 2.5. 2.7 L bolus given.  IV aztreonam given for multiple antibiotic allergies. -Portable  chest x-ray -CT Head -Follow-up blood and  urine cultures drawn in ED -Trend lactic acid -Will use IV ceftriaxone 1g daily, patient tolerated in 2018 admission for same. -CBC a.m. - N/s + 20Kcl 100cc/hr x15 hrs  Left ankle rash- chronic, present ~80months.  Well-circumscribed does not appear infected.  Has been prescribed topical steroids in the recent past with periods of improvement and worsening. -Topical steroid- triamcinolone  Functional Quadriplegia-patient bedbound for fear of falls, has not ambulated in over a year. Has caregivers ~ 4hrs daily.  Patient has declined placement in nursing home. - PT eval  Rheumatoid osteoarthritis-stable. Currently not on medication.  CVA- Stable.  - Cont home plavix  Anxiety- Stable - Cont home xanax, Buspar, patroxetine  DVT prophylaxis: Lovenox Code Status: Full Family Communication: 2 caregivers at bedside Disposition Plan: 1-2 days Consults called: None Admission status: Obs, med-surg   Bethena Roys MD [facility]  07/30/2018, 5:10 PM

## 2018-07-30 NOTE — ED Provider Notes (Signed)
[facility] EMERGENCY DEPARTMENT Provider Note   CSN: [id number] Arrival date & time: 07/30/18  1143    History   Chief Complaint Chief Complaint  Patient presents with  . Hallucinations  . urinary presurre  . skin lesion    HPI Robin Blackburn is a 73 y.o. female.     HPI   Patient presents for evaluation of low back pain with urinary urgency, for 2 days.  She states she has felt like this previously when she had a urinary tract infection.  The patient came here by EMS for evaluation.  The patient is in bed 24 hours a day.  She wears a diaper.  She has an aide that comes daily for several hours and helps clean her up and get food for her.  She complains of a sore on her left foot which is been present for several months and evaluated previously by her PCP.  She denies cough, shortness of breath, dizziness or weakness.  There are no other known modifying factors.  Past Medical History:  Diagnosis Date  . Acid reflux   . Anxiety   . 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 Depression   . Fibromyalgia   . Hypertension   . Neuropathy   . Rheumatoid arthritis ([place])   . Stroke [facility]) 02/17/14    Patient Active Problem List   Diagnosis Date Noted  . Acute lower UTI 11/25/2016  . Confusion associated with infection (UTI) 11/25/2016  . Urinary tract infection without hematuria 04/02/2016  . UTI (urinary tract infection) 04/02/2016  . 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 Acute pain of left knee 12/18/2015  . Osteoporosis 11/01/2015  . Vitamin D deficiency 10/11/2015  . History of fracture of left ankle 09/28/2015  . Chronic pain in left foot 09/28/2015  . Fibromyalgia 09/28/2015  . Rash and nonspecific skin eruption 07/17/2015  . Cerumen impaction 05/30/2015  . Edema 04/16/2015  . Cellulitis 04/16/2015  . Mouth pain 04/08/2015  . Pain in joint, lower leg 04/08/2015  . GERD (gastroesophageal reflux disease)  04/05/2015  . Occult blood positive stool 01/17/2015  . Diarrhea 01/05/2015  . Chronic pain syndrome 12/02/2014  . Pain in joint, upper arm 10/29/2014  . Nasal congestion 10/29/2014  . Allergic rhinitis 08/20/2014  . Conjunctivitis 08/08/2014  . Dysuria 06/27/2014  . Back pain, lumbosacral 06/23/2014  . Sinusitis, chronic 06/17/2014  . Fall 05/24/2014  . H/O: CVA (cerebrovascular accident) 05/24/2014  . HLD (hyperlipidemia) 05/24/2014  . Physical deconditioning 05/24/2014  . Fracture of fifth metatarsal bone of right foot 03/06/2014  . Right sided weakness 02/26/2014  . Leukocytosis 02/20/2014  . Rheumatoid arthritis ([place]) 02/20/2014  . CVA (cerebral infarction) 02/19/2014  . Right ankle sprain 02/19/2014  . Contusion of right foot 02/19/2014  . Essential hypertension 02/19/2014  . Neuropathy 02/19/2014  . Anxiety 02/19/2014  . Hyponatremia 02/19/2014  . Hypokalemia 02/19/2014    Past Surgical History:  Procedure Laterality Date  . ABDOMINAL HYSTERECTOMY    . ANKLE RECONSTRUCTION    . APPENDECTOMY    . BACK SURGERY    . CHOLECYSTECTOMY    . KNEE SURGERY       OB History   No obstetric history on file.      Home Medications    Prior to Admission medications   Medication Sig Start Date End Date Taking? Authorizing Provider  ALPRAZolam Duanne Moron) 0.5 MG tablet Take 1 tablet (0.5 mg total) by mouth  3 (three) times daily as needed for anxiety. 11/28/16   Arrien, Jimmy Picket, MD  busPIRone (BUSPAR) 5 MG tablet Take 5 mg by mouth 2 (two) times daily.    [provider]  Cholecalciferol 5000 units TABS Will take 5,000 unit along with 2,000 unit daily for a 7,000 unit daily    [provider]  clopidogrel (PLAVIX) 75 MG tablet Take 75 mg by mouth at bedtime. 05/03/14   [provider]  desloratadine (CLARINEX) 5 MG tablet Take 5 mg by mouth daily.    [provider]  diclofenac sodium (VOLTAREN) 1 % GEL Apply 2 g topically 4 (four)  times daily. Apply to the right knee. 11/28/16   Arrien, Jimmy Picket, MD  dicyclomine (BENTYL) 20 MG tablet Take 20 mg by mouth every 6 (six) hours as needed. Abdominal cramps 01/31/14   [provider]  docusate sodium (COLACE) 100 MG capsule Take 100 mg by mouth 2 (two) times daily as needed for mild constipation.     [provider]  feeding supplement, ENSURE ENLIVE, (ENSURE ENLIVE) LIQD Take 237 mLs by mouth 2 (two) times daily between meals. 11/28/16   Arrien, Jimmy Picket, MD  hydroxychloroquine (PLAQUENIL) 200 MG tablet TAKE ONE TABLET BY MOUTH ONCE DAILY. 10/16/16   Panwala, Naitik, PA-C  ibuprofen (ADVIL,MOTRIN) 200 MG tablet Take 1 tablet (200 mg total) by mouth every 8 (eight) hours as needed for moderate pain (Right knee pain.). 11/28/16   Arrien, Jimmy Picket, MD  lansoprazole (PREVACID) 30 MG capsule Take 30 mg by mouth 2 (two) times daily. 02/17/14   [provider]  loperamide (IMODIUM A-D) 2 MG tablet Take 2 mg by mouth 4 (four) times daily as needed for diarrhea or loose stools.    [provider]  meclizine (ANTIVERT) 12.5 MG tablet Take 1 tablet (12.5 mg total) by mouth 3 (three) times daily as needed for dizziness. 12/04/16   Rancour, Annie Main, MD  metoprolol tartrate (LOPRESSOR) 25 MG tablet Take 1 tablet (25 mg total) by mouth 2 (two) times daily. 09/28/15   Hendricks Limes, MD  ondansetron (ZOFRAN) 4 MG tablet Take 4 mg by mouth every 6 (six) hours as needed. Give with plaquenil daily at 10 am. Give 4 mg as needed for N / V    [provider]  PARoxetine (PAXIL) 40 MG tablet Take 1 tablet by mouth daily. 12/14/15   [provider]  Propylene Glycol 0.6 % SOLN Place 1 drop into both eyes daily. At noon.    [provider]  RESTASIS 0.05 % ophthalmic emulsion Place 1 drop into both eyes 2 (two) times daily. 01/31/14   [provider]  Simethicone 125 MG TABS Chew 125 mg by mouth at bedtime. And as needed.     [provider]  sodium chloride (OCEAN) 0.65 % SOLN nasal spray Place 1 spray into both nostrils as needed for congestion. 02/22/14   Rexene Alberts, MD  traZODone (DESYREL) 50 MG tablet Take 50 mg by mouth at bedtime.    [provider]    Family History Family History  Problem Relation Age of Onset  . Hypertension Father   . Transient ischemic attack Father   . Hypertension Brother   . CVA Maternal Grandfather   . Leukemia Brother     Social History Social History   Tobacco Use  . Smoking status: Never Smoker  . Smokeless tobacco: Never Used  Substance Use Topics  . Alcohol use: No  .  Drug use: No     Allergies   Demerol [meperidine]; Hydromorphone; Keflex [cephalexin]; Penicillins; Sulfa antibiotics; Bisphosphonates; Fentanyl; Iohexol; Percocet [oxycodone-acetaminophen]; Shellfish allergy; Terramycin [oxytetracycline]; Ciprofloxacin; Levaquin [levofloxacin in d5w]; and Morphine and related   Review of Systems Review of Systems  All other systems reviewed and are negative.    Physical Exam Updated Vital Signs BP (!) 175/93   Pulse 86   Temp 98.9 F (37.2 C) (Rectal)   Resp (!) 22   Ht 5\' 6"  (1.676 m)   Wt 90.7 kg   SpO2 97%   BMI 32.28 kg/m   Physical Exam Vitals signs and nursing note reviewed.  Constitutional:      General: She is not in acute distress.    Appearance: She is well-developed. She is obese. She is not ill-appearing, toxic-appearing or diaphoretic.     Comments: Elderly, frail  HENT:     Head: Normocephalic and atraumatic.  Eyes:     Conjunctiva/sclera: Conjunctivae normal.     Pupils: Pupils are equal, round, and reactive to light.  Neck:     Musculoskeletal: Normal range of motion and neck supple.     Trachea: Phonation normal.  Cardiovascular:     Rate and Rhythm: Normal rate and regular rhythm.  Pulmonary:     Effort: Pulmonary effort is normal. No respiratory distress.     Breath sounds: Normal breath sounds.  No stridor. No wheezing or rhonchi.  Chest:     Chest wall: No tenderness.  Abdominal:     General: There is no distension.     Palpations: Abdomen is soft.     Tenderness: There is no abdominal tenderness. There is no guarding.  Musculoskeletal:     Comments: Somewhat decreased active motion right knee secondary to pain.  Strength legs, 3/5, bilateral.  Skin:    General: Skin is warm and dry.     Comments: Old wound left medial ankle and foot, characterized by small amount of eschar and indistinct redness.  Not areas of associated drainage, fluctuance or bleeding.  Neurological:     Mental Status: She is alert and oriented to person, place, and time.     Motor: No abnormal muscle tone.  Psychiatric:        Mood and Affect: Mood normal.        Behavior: Behavior normal.        Thought Content: Thought content normal.        Judgment: Judgment normal.      ED Treatments / Results  Labs (all labs ordered are listed, but only abnormal results are displayed) Labs Reviewed  CBC WITH DIFFERENTIAL/PLATELET - Abnormal; Notable for the following components:      Result Value   WBC 15.9 (*)    Platelets 467 (*)    Neutro Abs 11.5 (*)    Monocytes Absolute 1.1 (*)    Abs Immature Granulocytes 0.15 (*)    All other components within normal limits  BASIC METABOLIC PANEL - Abnormal; Notable for the following components:   Glucose, Bld 150 (*)    All other components within normal limits  URINALYSIS, ROUTINE W REFLEX MICROSCOPIC - Abnormal; Notable for the following components:   APPearance HAZY (*)    Hgb urine dipstick LARGE (*)    Nitrite POSITIVE (*)    Leukocytes,Ua LARGE (*)    RBC / HPF >50 (*)    Bacteria, UA MANY (*)    All other components within normal limits  LACTIC ACID, PLASMA -  Abnormal; Notable for the following components:   Lactic Acid, Venous 2.8 (*)    All other components within normal limits  LACTIC ACID, PLASMA - Abnormal; Notable for the following  components:   Lactic Acid, Venous 2.5 (*)    All other components within normal limits  CULTURE, BLOOD (ROUTINE X 2)  CULTURE, BLOOD (ROUTINE X 2)  URINE CULTURE    EKG EKG Interpretation  Date/Time:  Friday July 30 2018 11:51:25 EST Ventricular Rate:  94 PR Interval:    QRS Duration: 111 QT Interval:  364 QTC Calculation: 456 R Axis:   46 Text Interpretation:  Artifact Sinus rhythm Low voltage, precordial leads Borderline repolarization abnormality Artifact in lead(s) I III aVR aVL aVF since last tracing no significant change Confirmed by Daleen Bo [phone]) on 07/30/2018 12:05:22 PM   Radiology No results found.  Procedures .Critical Care Performed by: Daleen Bo, MD Authorized by: Daleen Bo, MD   Critical care provider statement:    Critical care time (minutes):  45   Critical care start time:  07/30/2018 12:00 PM   Critical care end time:  07/30/2018 3:39 PM   Critical care time was exclusive of:  Separately billable procedures and treating other patients   Critical care was necessary to treat or prevent imminent or life-threatening deterioration of the following conditions:  Sepsis   Critical care was time spent personally by me on the following activities:  Blood draw for specimens, development of treatment plan with patient or surrogate, discussions with consultants, evaluation of patient's response to treatment, examination of patient, obtaining history from patient or surrogate, ordering and performing treatments and interventions, ordering and review of laboratory studies, pulse oximetry, re-evaluation of patient's condition, review of old charts and ordering and review of radiographic studies   (including critical care time)  Medications Ordered in ED Medications  0.9 %  sodium chloride infusion ( Intravenous New Bag/Given 07/30/18 1253)  sodium chloride 0.9 % with aztreonam (AZACTAM) ADS Med (  Not Given 07/30/18 1412)  sodium chloride 0.9 % bolus 500  mL (0 mLs Intravenous Stopped 07/30/18 1242)  ondansetron (ZOFRAN) injection 4 mg (4 mg Intravenous Given 07/30/18 1308)  ALPRAZolam (XANAX) tablet 0.5 mg (0.5 mg Oral Given 07/30/18 1309)  aztreonam (AZACTAM) 1 g in sodium chloride 0.9 % 100 mL IVPB (1 g Intravenous New Bag/Given 07/30/18 1500)  sodium chloride 0.9 % bolus 2,200 mL (2,200 mLs Intravenous New Bag/Given 07/30/18 1410)  acetaminophen (TYLENOL) tablet 650 mg (650 mg Oral Given 07/30/18 1530)     Initial Impression / Assessment and Plan / ED Course  I have reviewed the triage vital signs and the nursing notes.  Pertinent labs & imaging results that were available during my care of the patient were reviewed by me and considered in my medical decision making (see chart for details).  Clinical Course as of Jul 31 1543  Fri Jul 30, 2018  1358 Lactate elevated, urinalysis consistent with UTI.  Will start intravenous antibiotics and give fluid bolus.  Patient has a complex allergy profile, aztreonam ordered.   [EW]  1529 Normal except glucose high  Basic metabolic panel(!) [EW]  3299 Normal except white count high, platelets elevated  CBC with Differential(!) [EW]  1530 Delta lactate is improving, still abnormal  Lactic acid, plasma(!!) [EW]  1531 Abnormal, nitrite positive, large leukocytes, greater than 50 RBCs, increased WBC, many bacteria, cath sample.  Urine culture ordered  Urinalysis, Routine w reflex microscopic(!) [EW]    Clinical Course  User Index [EW] Daleen Bo, MD        Patient Vitals for the past 24 hrs:  BP Temp Temp src Pulse Resp SpO2 Height Weight  07/30/18 1500 (!) 175/93 - - 86 - 97 % - -  07/30/18 1430 (!) 157/88 - - 89 - 96 % - -  07/30/18 1400 (!) 144/76 - - 91 - 97 % - -  07/30/18 1330 (!) 157/79 - - 87 - 96 % - -  07/30/18 1300 (!) 152/78 - - 87 - 99 % - -  07/30/18 1238 - 98.9 F (37.2 C) Rectal - - - - -  07/30/18 1230 (!) 164/87 - - 87 (!) 22 100 % - -  07/30/18 1200 (!) 155/97 - - 93 19  94 % - -  07/30/18 1155 (!) 154/104 - - - - - - -  07/30/18 1152 - 98.4 F (36.9 C) Oral 92 15 96 % - 90.7 kg  07/30/18 1150 - - - - - - 5\' 6"  (1.676 m) 80.7 kg   Sepsis - Repeat Assessment  Performed at:    3:40 PM  Vitals     Blood pressure (!) 175/93, pulse 86, temperature 98.9 F (37.2 C), temperature source Rectal, resp. rate (!) 22, height 5\' 6"  (1.676 m), weight 90.7 kg, SpO2 97 %.  Heart:     Regular rate and rhythm  Lungs:    CTA  Capillary Refill:   <2 sec  Peripheral Pulse:   Radial pulse palpable  Skin:     Normal Color    3:32 PM Reevaluation with update and discussion. After initial assessment and treatment, an updated evaluation reveals patient is alert and cooperative and continues to complain of a pressure sensation in her left ankle and foot.  Previously she was given Tylenol.  We will asked nurse to apply hot compress to the area.  Findings discussed with her and all questions were answered. Daleen Bo   Medical Decision Making: Malaise, with weakness and urinary tract infection.  Mild elevation lactate, improved somewhat with IV fluid 30 cc/kg bolus.  Mild tachycardia with mildly elevated blood pressure.  No hemodynamic instability.  Nonspecific left foot discomfort, with an old wound.  This may require some wound care management.  Patient has risk for compromise if discharged therefore will keep for observation, with continued parenteral treatment.  CRITICAL CARE-yes Performed by: Daleen Bo  Nursing Notes Reviewed/ Care Coordinated Applicable Imaging Reviewed Interpretation of Laboratory Data incorporated into ED treatment  3:50 PM-Consult complete with hospitalist. Patient case explained and discussed.  She agrees to admit patient for further evaluation and treatment. Call ended at 4:00 PM  Plan: Admit  Final Clinical Impressions(s) / ED Diagnoses   Final diagnoses:  Sepsis without acute organ dysfunction, due to unspecified organism [facility])    Urinary tract infection without hematuria, site unspecified  Left foot pain    ED Discharge Orders    None       Daleen Bo, MD 07/30/18 1601

## 2018-07-30 NOTE — ED Notes (Signed)
Pt reports that she thought a rat crawled in the bed with her last night

## 2018-07-31 ENCOUNTER — Other Ambulatory Visit: Payer: Self-pay

## 2018-07-31 DIAGNOSIS — N390 Urinary tract infection, site not specified: Secondary | ICD-10-CM | POA: Diagnosis not present

## 2018-07-31 DIAGNOSIS — R6510 Systemic inflammatory response syndrome (SIRS) of non-infectious origin without acute organ dysfunction: Secondary | ICD-10-CM

## 2018-07-31 DIAGNOSIS — G92 Toxic encephalopathy: Secondary | ICD-10-CM | POA: Diagnosis not present

## 2018-07-31 DIAGNOSIS — E860 Dehydration: Secondary | ICD-10-CM | POA: Diagnosis not present

## 2018-07-31 DIAGNOSIS — E872 Acidosis, unspecified: Secondary | ICD-10-CM

## 2018-07-31 DIAGNOSIS — M79672 Pain in left foot: Secondary | ICD-10-CM | POA: Diagnosis not present

## 2018-07-31 DIAGNOSIS — G929 Unspecified toxic encephalopathy: Secondary | ICD-10-CM

## 2018-07-31 LAB — CBC
HCT: 37 % (ref 36.0–46.0)
Hemoglobin: 10.8 g/dL — ABNORMAL LOW (ref 12.0–15.0)
MCH: 29 pg (ref 26.0–34.0)
MCHC: 29.2 g/dL — ABNORMAL LOW (ref 30.0–36.0)
MCV: 99.2 fL (ref 80.0–100.0)
Platelets: 317 10*3/uL (ref 150–400)
RBC: 3.73 MIL/uL — AB (ref 3.87–5.11)
RDW: 14 % (ref 11.5–15.5)
WBC: 9.8 10*3/uL (ref 4.0–10.5)
nRBC: 0 % (ref 0.0–0.2)

## 2018-07-31 MED ORDER — HYDROCODONE-ACETAMINOPHEN 5-325 MG PO TABS: 1.0000 | ORAL_TABLET | Freq: Four times a day (QID) | ORAL | 0 refills | Status: AC | PRN

## 2018-07-31 MED ORDER — CEFUROXIME AXETIL 500 MG PO TABS: 500.0000 mg | ORAL_TABLET | Freq: Two times a day (BID) | ORAL | 0 refills | Status: AC

## 2018-07-31 MED ORDER — TRIAMCINOLONE ACETONIDE 0.5 % EX CREA: TOPICAL_CREAM | Freq: Two times a day (BID) | CUTANEOUS | 0 refills | Status: DC

## 2018-07-31 MED ORDER — TRIAMCINOLONE ACETONIDE 0.5 % EX CREA
TOPICAL_CREAM | Freq: Two times a day (BID) | CUTANEOUS | Status: DC
  Administered 2018-07-31 – 2018-08-01 (×3): via TOPICAL
  Filled 2018-07-31: qty 15

## 2018-07-31 NOTE — Discharge Summary (Signed)
Physician Discharge Summary  Robin Blackburn EHU:[id number] DOB: 1945/11/24 DOA: 07/30/2018  PCP: Redmond School, MD  Admit date: 07/30/2018 Discharge date: 07/31/2018  Time spent: 35 minutes  Recommendations for Outpatient Follow-up:  Repeat basic metabolic panel to follow electrolytes and renal function Follow final urine culture results and adjust antibiotic therapy as needed. Arrange outpatient follow-up with dermatology, to further evaluate and treat patient skin affectation (left lower extremity). Follow resolution of patient's chronic pain on further prescribe analgesics as needed.  Discharge Diagnoses:  Active Problems:   Left foot pain   Toxic encephalopathy   Dehydration   Lactic acidosis   SIRS (systemic inflammatory response syndrome) (HCC) Left ankle rash Chronic pain Anxiety GERD History of CVA.  Discharge Condition: Stable and improved.  Patient discharged back home with instruction to follow-up with PCP in 10 days.  Diet recommendation: Heart healthy diet.  Filed Weights   07/30/18 1150 07/30/18 1152 07/30/18 1832  Weight: 80.7 kg 90.7 kg 90.4 kg    History of present illness:  As per H&P written by Dr. Denton Brick on 07/30/2018 73 y.o. female with medical history significant for CVA, HTN, Rheumatoid Arthritis who was brought to the ED with complaints of hallucinations, confusion over the past 2 days.  Patient's caregiver is at bedside and helps with the history.  It is alert and oriented to person and place but some answers to her questions are not appropriate.  Caregivers reports worsening confusion and disorientation over the past 2 days, similar occurence  2018 also in the setting of UTI.  Patient reported seeing many rats in her bed, on her dresser. Patient also reports urinary pressure, but cannot confirm or refute burning with urination.  No fever or chills noted at home.  Patient has caregivers that come in for about 4 hours a day, they clean, eat, change patient  will give her food to eat otherwise patient is lying in bed all day. Patient is bedbound, has not walked in over a year.  Patient is unstable on her feet and afraid to fall hence she lies in bed all day. She wears diapers. Patient denies difficulty breathing, she has a cough -worse at night, no vomiting or loose stools, chronic back problems unchanged. Patient has declined nursing home placement, as suggested by her primary care provider.  ED Course: Blood pressure 150s to 170s, otherwise stable vitals.  WBC 15.9.  Unremarkable BMP.  UA with positive nitrites and large leukocytes, WBCs, many bacteria.  Lactic acidosis 2.8 > 2.5, 2.7 L bolus given.  Urine and blood cultures Ordered.  Patient given IV aztreonam as she has an extensive allergy list.  Hospital Course:  1-toxic metabolic encephalopathy -In the setting of UTI -Improved with fluid resuscitation and IV antibiotics -Mentation back to baseline and no further experiencing hallucinations -Patient denies any nausea, vomiting, abdominal pain or dysuria at time of discharge -Will send home with 5 days of Ceftin. -Will follow final results of her urine culture and make any adjustments to antibiotic therapy as necessary -Patient is afebrile and with normal WBCs.  2-SIRS/lactic acidosis: In the setting of UTI -Patient didn't meet criteria for sepsis. -After fluid resuscitation and antibiotics initiation systemic inflammatory response was corrected and patient was hemodynamically stable discharged home. -Lactic acid essentially back to normal range after fluid resuscitation.  3-left ankle rash -Chronic (has been present on and off for the last 6 months) -He had some psoriatic appearance, had responded to the use of a steroid cream in the past. -  Patient discharged on triamcinolone twice a day and recommendations to pursued evaluation by a dermatologist as an outpatient.  4-functional quadriplegia -Patient was again has refused skilled  nursing facility -Continue home caregivers assistance. -Will benefit of home health services if she is in agreement down the road.  5-history rheumatoid arthritis -Currently not receiving any medications -Continue outpatient follow-up  6-anxiety -Is stable -Continue the use of as needed Xanax and BuSpar  7-GERD -Continue Prevacid  8-history of CVA -Continue Plavix for secondary prevention -No acute neurologic deficit appreciated.  9-chronic pain: Patient complaining of lower back, knees and ankle pain -Discharge on pain medication. -encourage to increase physical activity.   Procedures:  See below for x-ray reports.  Consultations:  None  Discharge Exam: Vitals:   07/30/18 2101 07/30/18 2157  BP:  117/62  Pulse:  89  Resp:  20  Temp:  99.3 F (37.4 C)  SpO2: 92% 96%    General: Afebrile, denying any further hallucinations; answering questions appropriately and in no acute distress.  Patient reported having pain on her left knee, lower back and left foot.  Oriented x3. Cardiovascular: S1 and S2, no rubs, no gallops, no JVD. Respiratory: Clear to auscultation bilaterally. 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  Discharge Instructions   Discharge Instructions    Diet - low sodium heart healthy   Complete by:  As directed    Discharge instructions   Complete by:  As directed    Take medications as prescribed Keep yourself well-hydrated Arrange follow-up with PCP in 10 days.     Allergies as of 07/31/2018      Reactions   Demerol [meperidine] Anaphylaxis   Hydromorphone Anaphylaxis   Keflex [cephalexin] Nausea And Vomiting   Penicillins Anaphylaxis   Has patient had a PCN reaction causing immediate rash, facial/tongue/throat swelling, SOB or  lightheadedness with hypotension: Yes Has patient had a PCN reaction causing severe rash involving mucus membranes or skin necrosis: No Has patient had a PCN reaction that required hospitalization No Has patient had a PCN reaction occurring within the last 10 years: No If all of the above answers are "NO", then may proceed with Cephalosporin use.   Sulfa Antibiotics Nausea And Vomiting   Bisphosphonates    GI intolerance   Fentanyl Other (See Comments)   "felt like I had demons in my head"   Iohexol    Percocet [oxycodone-acetaminophen] Swelling   Mouth swelling.   Shellfish Allergy    Glucosamine not an option   Terramycin [oxytetracycline] Nausea And Vomiting   Ciprofloxacin Nausea Only, Rash   Levaquin [levofloxacin In D5w] Nausea And Vomiting, Rash   Morphine And Related Rash      Medication List    STOP taking these medications   ibuprofen 200 MG tablet Commonly known as:  ADVIL,MOTRIN   methocarbamol 750 MG tablet Commonly known as:  ROBAXIN     TAKE these medications   acetaminophen 500 MG tablet Commonly known as:  TYLENOL Take 500 mg by mouth every 6 (six) hours as needed for mild pain or moderate pain.   ALPRAZolam 0.5 MG tablet Commonly known as:  XANAX Take 1 tablet (0.5 mg total) by mouth 3 (three) times daily as needed for anxiety. What changed:  when to take this   busPIRone 15 MG tablet Commonly known as:  BUSPAR  Take 15 mg by mouth 2 (two) times daily.   cefUROXime 500 MG tablet Commonly known as:  CEFTIN Take 1 tablet (500 mg total) by mouth 2 (two) times daily for 5 days.   Cholecalciferol 125 MCG (5000 UT) Tabs Take 1 tablet by mouth daily.   clopidogrel 75 MG tablet Commonly known as:  PLAVIX Take 75 mg by mouth at bedtime.   desloratadine 5 MG tablet Commonly known as:  CLARINEX Take 5 mg by mouth every morning.   dicyclomine 20 MG tablet Commonly known as:  BENTYL Take 20 mg by mouth every 6 (six) hours. Abdominal cramps    HYDROcodone-acetaminophen 5-325 MG tablet Commonly known as:  NORCO/VICODIN Take 1 tablet by mouth every 6 (six) hours as needed for up to 5 days for severe pain.   lansoprazole 30 MG capsule Commonly known as:  PREVACID Take 30 mg by mouth 2 (two) times daily.   metoprolol tartrate 25 MG tablet Commonly known as:  LOPRESSOR Take 1 tablet (25 mg total) by mouth 2 (two) times daily.   ondansetron 4 MG tablet Commonly known as:  ZOFRAN Take 4 mg by mouth every 6 (six) hours as needed for nausea or vomiting.   PARoxetine 10 MG tablet Commonly known as:  PAXIL Take 10 mg by mouth at bedtime.   Propylene Glycol 0.6 % Soln Place 1 drop into both eyes daily. At noon.   RESTASIS 0.05 % ophthalmic emulsion Generic drug:  cycloSPORINE Place 1 drop into both eyes 2 (two) times daily.   sodium chloride 0.65 % Soln nasal spray Commonly known as:  OCEAN Place 1 spray into both nostrils as needed for congestion. What changed:  when to take this   tiZANidine 4 MG tablet Commonly known as:  ZANAFLEX Take 2-4 mg by mouth 3 (three) times daily as needed for muscle spasms.   triamcinolone cream 0.5 % Commonly known as:  KENALOG Apply topically 2 (two) times daily. Apply to affected area twice daily as directed (left ankle area)      Allergies  Allergen Reactions  . Demerol [Meperidine] Anaphylaxis  . Hydromorphone Anaphylaxis  . Keflex [Cephalexin] Nausea And Vomiting  . Penicillins Anaphylaxis    Has patient had a PCN reaction causing immediate rash, facial/tongue/throat swelling, SOB or lightheadedness with hypotension: Yes Has patient had a PCN reaction causing severe rash involving mucus membranes or skin necrosis: No Has patient had a PCN reaction that required hospitalization No Has patient had a PCN reaction occurring within the last 10 years: No If all of the above answers are "NO", then may proceed with Cephalosporin use.   . Sulfa Antibiotics Nausea And Vomiting  .  Bisphosphonates     GI intolerance  . Fentanyl Other (See Comments)    "felt like I had demons in my head"  . Iohexol   . Percocet [Oxycodone-Acetaminophen] Swelling    Mouth swelling.  . Shellfish Allergy     Glucosamine not an option  . Terramycin [Oxytetracycline] Nausea And Vomiting  . Ciprofloxacin Nausea Only and Rash  . Levaquin [Levofloxacin In D5w] Nausea And Vomiting and Rash  . Morphine And Related Rash   Follow-up Information    Redmond School, MD. Schedule an appointment as soon as possible for a visit in 10 day(s).   Specialty:  Internal Medicine Contact information: [address] [place] [address] [phone]           The results of significant diagnostics from this hospitalization (including imaging, microbiology,  ancillary and laboratory) are listed below for reference.    Significant Diagnostic Studies: Ct Head Wo Contrast  Result Date: 07/30/2018 CLINICAL DATA:  Worsening confusion and disorientation over the past 2 days. EXAM: CT HEAD WITHOUT CONTRAST TECHNIQUE: Contiguous axial images were obtained from the base of the skull through the vertex without intravenous contrast. COMPARISON:  MR brain and CT head dated April 02, 2016. FINDINGS: Brain: No evidence of acute infarction, hemorrhage, hydrocephalus, extra-axial collection or mass lesion/mass effect. Stable moderate atrophy and chronic microvascular ischemic changes. Unchanged lacunar infarct in the left thalamus. Vascular: No hyperdense vessel or unexpected calcification. Skull: Negative for fracture or focal lesion. Sinuses/Orbits: No acute finding. Other: None. IMPRESSION: 1.  No acute intracranial abnormality. 2. Stable moderate atrophy and chronic microvascular ischemic changes. Electronically Signed   By: Titus Dubin M.D.   On: 07/30/2018 17:55   Dg Chest Port 1 View  Result Date: 07/30/2018 CLINICAL DATA:  Two-day history of hallucinations. EXAM: PORTABLE CHEST 1 VIEW COMPARISON:   06/21/2014 FINDINGS: 1721 hours. Lordotic positioning. The cardio pericardial silhouette is enlarged. The lungs are clear without focal pneumonia, edema, pneumothorax or pleural effusion. Haziness in the lung apices likely related to position. The visualized bony structures of the thorax are intact. Telemetry leads overlie the chest. IMPRESSION: No acute cardiopulmonary findings. Electronically Signed   By: Misty Stanley M.D.   On: 07/30/2018 17:54    Microbiology: Recent Results (from the past 240 hour(s))  Culture, blood (routine x 2)     Status: None (Preliminary result)   Collection Time: 07/30/18  2:41 PM  Result Value Ref Range Status   Specimen Description BLOOD BLOOD LEFT FOREARM  Final   Special Requests   Final    BOTTLES DRAWN AEROBIC AND ANAEROBIC Blood Culture adequate volume   Culture   Final    NO GROWTH < 24 HOURS Performed at [facility], [address] [place], [place] [address]    Report Status PENDING  Incomplete  Culture, blood (routine x 2)     Status: None (Preliminary result)   Collection Time: 07/30/18  2:52 PM  Result Value Ref Range Status   Specimen Description BLOOD LEFT ANTECUBITAL  Final   Special Requests   Final    BOTTLES DRAWN AEROBIC ONLY Blood Culture results may not be optimal due to an inadequate volume of blood received in culture bottles   Culture   Final    NO GROWTH < 24 HOURS Performed at [facility], [address], [place],  [address]    Report Status PENDING  Incomplete     Labs: Basic Metabolic Panel: Recent Labs  Lab 07/30/18 1200  NA 138  K 3.7  CL 102  CO2 23  GLUCOSE 150*  BUN 9  CREATININE 0.89  CALCIUM 9.0   CBC: Recent Labs  Lab 07/30/18 1200 07/31/18 0652  WBC 15.9* 9.8  NEUTROABS 11.5*  --   HGB 14.1 10.8*  HCT 45.0 37.0  MCV 94.3 99.2  PLT 467* 317    Signed:  Barton Dubois MD.  [facility] 07/31/2018, 2:49 PM

## 2018-07-31 NOTE — Progress Notes (Signed)
Placed call out to [facility] ( care taker) for dc pickup. Pt states the caretaker is not home but will come to pick her up in the morning. Dr. Dyann Kief notified of no discharge due to pickup. Will continue to call for pickup at [phone] and [phone]. Unable to dc due to no pickup. 4 calls made and VM left for brother.

## 2018-08-01 LAB — URINE CULTURE: Culture: >=100000 — AB

## 2018-08-01 NOTE — Progress Notes (Signed)
Patient right pinky toe nail became hung in cover, part of toenail off of bed. Area dressed. Patient denies pain at this time.

## 2018-08-01 NOTE — Progress Notes (Signed)
Phone calls attempted to both numbers on file [phone] and [phone] regarding patient's discharge. No answer. Voicemail's left. Will make MD aware and continue to monitor.

## 2018-08-01 NOTE — Progress Notes (Signed)
Patient's chart reviewed. VS stable and no overnight major concerns. Unable to be discharge as planned due to lack of transportation/care taker pick up. She remains stable and ready to be discharge.   Barton Dubois MD [phone]

## 2018-08-01 NOTE — Progress Notes (Signed)
Spoke with Alyse Low the patient's caretaker, arrange for EMS to transport patient between 1-2 PM as that is when she can be at patient's house to help patient get settled. Will continue to monitor.

## 2018-08-04 LAB — CULTURE, BLOOD (ROUTINE X 2)
Culture: NO GROWTH
Culture: NO GROWTH
Special Requests: ADEQUATE

## 2018-08-20 ENCOUNTER — Emergency Department (HOSPITAL_COMMUNITY): Payer: Medicare Other

## 2018-08-20 ENCOUNTER — Inpatient Hospital Stay (HOSPITAL_COMMUNITY)
Admission: EM | Admit: 2018-08-20 | Discharge: 2018-08-25 | DRG: 091 | Disposition: A | Discharge status: Skilled Nursing Facility | Payer: Medicare Other | Attending: Internal Medicine | Admitting: Internal Medicine

## 2018-08-20 ENCOUNTER — Encounter (HOSPITAL_COMMUNITY): Payer: Self-pay

## 2018-08-20 ENCOUNTER — Other Ambulatory Visit: Payer: Self-pay

## 2018-08-20 DIAGNOSIS — I10 Essential (primary) hypertension: Secondary | ICD-10-CM | POA: Diagnosis present

## 2018-08-20 DIAGNOSIS — Z87440 Personal history of urinary (tract) infections: Secondary | ICD-10-CM

## 2018-08-20 DIAGNOSIS — G92 Toxic encephalopathy: Secondary | ICD-10-CM | POA: Diagnosis not present

## 2018-08-20 DIAGNOSIS — M797 Fibromyalgia: Secondary | ICD-10-CM | POA: Diagnosis present

## 2018-08-20 DIAGNOSIS — F13932 Sedative, hypnotic or anxiolytic use, unspecified with withdrawal with perceptual disturbances: Secondary | ICD-10-CM | POA: Diagnosis present

## 2018-08-20 DIAGNOSIS — M069 Rheumatoid arthritis, unspecified: Secondary | ICD-10-CM | POA: Diagnosis present

## 2018-08-20 DIAGNOSIS — F419 Anxiety disorder, unspecified: Secondary | ICD-10-CM | POA: Diagnosis present

## 2018-08-20 DIAGNOSIS — G9340 Encephalopathy, unspecified: Secondary | ICD-10-CM | POA: Diagnosis present

## 2018-08-20 DIAGNOSIS — Z79899 Other long term (current) drug therapy: Secondary | ICD-10-CM

## 2018-08-20 DIAGNOSIS — X58XXXA Exposure to other specified factors, initial encounter: Secondary | ICD-10-CM | POA: Diagnosis present

## 2018-08-20 DIAGNOSIS — F13232 Sedative, hypnotic or anxiolytic dependence with withdrawal with perceptual disturbance: Secondary | ICD-10-CM | POA: Diagnosis present

## 2018-08-20 DIAGNOSIS — Z806 Family history of leukemia: Secondary | ICD-10-CM

## 2018-08-20 DIAGNOSIS — F329 Major depressive disorder, single episode, unspecified: Secondary | ICD-10-CM | POA: Diagnosis present

## 2018-08-20 DIAGNOSIS — Z7902 Long term (current) use of antithrombotics/antiplatelets: Secondary | ICD-10-CM

## 2018-08-20 DIAGNOSIS — M810 Age-related osteoporosis without current pathological fracture: Secondary | ICD-10-CM | POA: Diagnosis present

## 2018-08-20 DIAGNOSIS — G894 Chronic pain syndrome: Secondary | ICD-10-CM | POA: Diagnosis present

## 2018-08-20 DIAGNOSIS — T40605A Adverse effect of unspecified narcotics, initial encounter: Secondary | ICD-10-CM | POA: Diagnosis present

## 2018-08-20 DIAGNOSIS — Z823 Family history of stroke: Secondary | ICD-10-CM

## 2018-08-20 DIAGNOSIS — Z888 Allergy status to other drugs, medicaments and biological substances status: Secondary | ICD-10-CM

## 2018-08-20 DIAGNOSIS — Z882 Allergy status to sulfonamides status: Secondary | ICD-10-CM

## 2018-08-20 DIAGNOSIS — Z90710 Acquired absence of both cervix and uterus: Secondary | ICD-10-CM

## 2018-08-20 DIAGNOSIS — Z8249 Family history of ischemic heart disease and other diseases of the circulatory system: Secondary | ICD-10-CM

## 2018-08-20 DIAGNOSIS — Z885 Allergy status to narcotic agent status: Secondary | ICD-10-CM

## 2018-08-20 DIAGNOSIS — R532 Functional quadriplegia: Secondary | ICD-10-CM | POA: Diagnosis present

## 2018-08-20 DIAGNOSIS — R531 Weakness: Secondary | ICD-10-CM | POA: Diagnosis not present

## 2018-08-20 DIAGNOSIS — T43505A Adverse effect of unspecified antipsychotics and neuroleptics, initial encounter: Secondary | ICD-10-CM | POA: Diagnosis present

## 2018-08-20 DIAGNOSIS — Z87892 Personal history of anaphylaxis: Secondary | ICD-10-CM

## 2018-08-20 DIAGNOSIS — K219 Gastro-esophageal reflux disease without esophagitis: Secondary | ICD-10-CM | POA: Diagnosis present

## 2018-08-20 DIAGNOSIS — Z9049 Acquired absence of other specified parts of digestive tract: Secondary | ICD-10-CM

## 2018-08-20 DIAGNOSIS — Z881 Allergy status to other antibiotic agents status: Secondary | ICD-10-CM

## 2018-08-20 DIAGNOSIS — S90821A Blister (nonthermal), right foot, initial encounter: Secondary | ICD-10-CM

## 2018-08-20 DIAGNOSIS — G629 Polyneuropathy, unspecified: Secondary | ICD-10-CM | POA: Diagnosis present

## 2018-08-20 DIAGNOSIS — G9341 Metabolic encephalopathy: Secondary | ICD-10-CM | POA: Diagnosis not present

## 2018-08-20 DIAGNOSIS — Z880 Allergy status to penicillin: Secondary | ICD-10-CM

## 2018-08-20 DIAGNOSIS — Z8673 Personal history of transient ischemic attack (TIA), and cerebral infarction without residual deficits: Secondary | ICD-10-CM

## 2018-08-20 DIAGNOSIS — Z7401 Bed confinement status: Secondary | ICD-10-CM

## 2018-08-20 DIAGNOSIS — Z91013 Allergy to seafood: Secondary | ICD-10-CM

## 2018-08-20 DIAGNOSIS — Y92009 Unspecified place in unspecified non-institutional (private) residence as the place of occurrence of the external cause: Secondary | ICD-10-CM

## 2018-08-20 DIAGNOSIS — R234 Changes in skin texture: Secondary | ICD-10-CM

## 2018-08-20 DIAGNOSIS — R410 Disorientation, unspecified: Secondary | ICD-10-CM | POA: Insufficient documentation

## 2018-08-20 DIAGNOSIS — L97509 Non-pressure chronic ulcer of other part of unspecified foot with unspecified severity: Secondary | ICD-10-CM

## 2018-08-20 LAB — CBC WITH DIFFERENTIAL/PLATELET
Abs Immature Granulocytes: 0.05 10*3/uL (ref 0.00–0.07)
BASOS PCT: 1 %
Basophils Absolute: 0.1 10*3/uL (ref 0.0–0.1)
Eosinophils Absolute: 0.2 10*3/uL (ref 0.0–0.5)
Eosinophils Relative: 1 %
HCT: 42.6 % (ref 36.0–46.0)
Hemoglobin: 13.3 g/dL (ref 12.0–15.0)
Immature Granulocytes: 0 %
Lymphocytes Relative: 20 %
Lymphs Abs: 2.8 10*3/uL (ref 0.7–4.0)
MCH: 29.2 pg (ref 26.0–34.0)
MCHC: 31.2 g/dL (ref 30.0–36.0)
MCV: 93.6 fL (ref 80.0–100.0)
Monocytes Absolute: 1.1 10*3/uL — ABNORMAL HIGH (ref 0.1–1.0)
Monocytes Relative: 8 %
NEUTROS PCT: 70 %
Neutro Abs: 9.7 10*3/uL — ABNORMAL HIGH (ref 1.7–7.7)
PLATELETS: 477 10*3/uL — AB (ref 150–400)
RBC: 4.55 MIL/uL (ref 3.87–5.11)
RDW: 13.6 % (ref 11.5–15.5)
WBC: 14 10*3/uL — ABNORMAL HIGH (ref 4.0–10.5)
nRBC: 0 % (ref 0.0–0.2)

## 2018-08-20 LAB — URINALYSIS, ROUTINE W REFLEX MICROSCOPIC
Bacteria, UA: NONE SEEN
Bilirubin Urine: NEGATIVE
Glucose, UA: NEGATIVE mg/dL
Ketones, ur: NEGATIVE mg/dL
Leukocytes,Ua: NEGATIVE
Nitrite: NEGATIVE
PROTEIN: NEGATIVE mg/dL
Specific Gravity, Urine: 1.004 — ABNORMAL LOW (ref 1.005–1.030)
pH: 6 (ref 5.0–8.0)

## 2018-08-20 LAB — COMPREHENSIVE METABOLIC PANEL
ALT: 18 U/L (ref 0–44)
AST: 29 U/L (ref 15–41)
Albumin: 3.8 g/dL (ref 3.5–5.0)
Alkaline Phosphatase: 128 U/L — ABNORMAL HIGH (ref 38–126)
Anion gap: 11 (ref 5–15)
BUN: 9 mg/dL (ref 8–23)
CHLORIDE: 101 mmol/L (ref 98–111)
CO2: 25 mmol/L (ref 22–32)
Calcium: 8.8 mg/dL — ABNORMAL LOW (ref 8.9–10.3)
Creatinine, Ser: 0.82 mg/dL (ref 0.44–1.00)
GFR calc Af Amer: >60 mL/min (ref >60)
Glucose, Bld: 112 mg/dL — ABNORMAL HIGH (ref 70–99)
Potassium: 4.3 mmol/L (ref 3.5–5.1)
Sodium: 137 mmol/L (ref 135–145)
Total Bilirubin: 0.6 mg/dL (ref 0.3–1.2)
Total Protein: 7.6 g/dL (ref 6.5–8.1)

## 2018-08-20 LAB — LACTIC ACID, PLASMA: LACTIC ACID, VENOUS: 1.7 mmol/L (ref 0.5–1.9)

## 2018-08-20 MED ORDER — CLOPIDOGREL BISULFATE 75 MG PO TABS
75.0000 mg | ORAL_TABLET | Freq: Every day | ORAL | Status: DC
  Administered 2018-08-21 – 2018-08-24 (×5): 75 mg via ORAL
  Filled 2018-08-20 (×5): qty 1

## 2018-08-20 MED ORDER — ACETAMINOPHEN 500 MG PO TABS: 500.0000 mg | ORAL_TABLET | Freq: Four times a day (QID) | ORAL | Status: DC | PRN

## 2018-08-20 MED ORDER — VITAMIN D 25 MCG (1000 UNIT) PO TABS
5000.0000 [IU] | ORAL_TABLET | Freq: Every day | ORAL | Status: DC
  Administered 2018-08-21 – 2018-08-25 (×5): 5000 [IU] via ORAL
  Filled 2018-08-20 (×5): qty 5

## 2018-08-20 MED ORDER — ONDANSETRON 4 MG PO TBDP
4.0000 mg | ORAL_TABLET | Freq: Once | ORAL | Status: AC
  Administered 2018-08-20: 4 mg via ORAL
  Filled 2018-08-20: qty 1

## 2018-08-20 MED ORDER — SODIUM CHLORIDE 0.9 % IV SOLN: 250.0000 mL | INTRAVENOUS | Status: DC | PRN

## 2018-08-20 MED ORDER — ALPRAZOLAM 0.5 MG PO TABS
0.5000 mg | ORAL_TABLET | Freq: Three times a day (TID) | ORAL | Status: DC | PRN
  Administered 2018-08-21 – 2018-08-25 (×10): 0.5 mg via ORAL
  Filled 2018-08-20 (×10): qty 1

## 2018-08-20 MED ORDER — SODIUM CHLORIDE 0.9% FLUSH: 3.0000 mL | INTRAVENOUS | Status: DC | PRN

## 2018-08-20 MED ORDER — ONDANSETRON HCL 4 MG PO TABS
4.0000 mg | ORAL_TABLET | Freq: Four times a day (QID) | ORAL | Status: DC | PRN
  Administered 2018-08-21 – 2018-08-23 (×2): 4 mg via ORAL
  Filled 2018-08-20 (×2): qty 1

## 2018-08-20 MED ORDER — SODIUM CHLORIDE 0.9% FLUSH
3.0000 mL | Freq: Two times a day (BID) | INTRAVENOUS | Status: DC
  Administered 2018-08-21 – 2018-08-25 (×8): 3 mL via INTRAVENOUS

## 2018-08-20 MED ORDER — BUSPIRONE HCL 5 MG PO TABS
15.0000 mg | ORAL_TABLET | Freq: Two times a day (BID) | ORAL | Status: DC
  Administered 2018-08-21 – 2018-08-25 (×10): 15 mg via ORAL
  Filled 2018-08-20 (×10): qty 3

## 2018-08-20 MED ORDER — METOPROLOL TARTRATE 25 MG PO TABS
25.0000 mg | ORAL_TABLET | Freq: Two times a day (BID) | ORAL | Status: DC
  Administered 2018-08-21 – 2018-08-25 (×10): 25 mg via ORAL
  Filled 2018-08-20 (×10): qty 1

## 2018-08-20 MED ORDER — DICYCLOMINE HCL 10 MG PO CAPS
20.0000 mg | ORAL_CAPSULE | Freq: Four times a day (QID) | ORAL | Status: DC
  Administered 2018-08-21 – 2018-08-25 (×17): 20 mg via ORAL
  Filled 2018-08-20 (×17): qty 2

## 2018-08-20 MED ORDER — HYDROXYCHLOROQUINE SULFATE 200 MG PO TABS
200.0000 mg | ORAL_TABLET | Freq: Every day | ORAL | Status: DC
  Administered 2018-08-21 – 2018-08-25 (×5): 200 mg via ORAL
  Filled 2018-08-20 (×5): qty 1

## 2018-08-20 MED ORDER — ONDANSETRON HCL 4 MG/2ML IJ SOLN
4.0000 mg | Freq: Four times a day (QID) | INTRAMUSCULAR | Status: DC | PRN
  Administered 2018-08-20 – 2018-08-22 (×2): 4 mg via INTRAVENOUS
  Filled 2018-08-20 (×2): qty 2

## 2018-08-20 MED ORDER — HYDROCODONE-ACETAMINOPHEN 5-325 MG PO TABS
1.0000 | ORAL_TABLET | Freq: Four times a day (QID) | ORAL | Status: DC | PRN
  Administered 2018-08-20 – 2018-08-25 (×14): 1 via ORAL
  Filled 2018-08-20 (×14): qty 1

## 2018-08-20 MED ORDER — CYCLOSPORINE 0.05 % OP EMUL
1.0000 [drp] | Freq: Two times a day (BID) | OPHTHALMIC | Status: DC
  Administered 2018-08-21 – 2018-08-25 (×9): 1 [drp] via OPHTHALMIC
  Filled 2018-08-20 (×15): qty 30

## 2018-08-20 MED ORDER — QUETIAPINE FUMARATE 25 MG PO TABS
25.0000 mg | ORAL_TABLET | Freq: Every day | ORAL | Status: DC
  Administered 2018-08-21 – 2018-08-22 (×3): 25 mg via ORAL
  Filled 2018-08-20 (×3): qty 1

## 2018-08-20 MED ORDER — METHOCARBAMOL 500 MG PO TABS
750.0000 mg | ORAL_TABLET | Freq: Four times a day (QID) | ORAL | Status: DC
  Administered 2018-08-21 (×2): 750 mg via ORAL
  Filled 2018-08-20 (×2): qty 2

## 2018-08-20 MED ORDER — PAROXETINE HCL 10 MG PO TABS
10.0000 mg | ORAL_TABLET | Freq: Every day | ORAL | Status: DC
  Administered 2018-08-21 – 2018-08-22 (×3): 10 mg via ORAL
  Filled 2018-08-20 (×3): qty 1

## 2018-08-20 MED ORDER — TIZANIDINE HCL 4 MG PO TABS: 4.0000 mg | ORAL_TABLET | Freq: Two times a day (BID) | ORAL | Status: DC | PRN

## 2018-08-20 MED ORDER — HYDROCODONE-ACETAMINOPHEN 5-325 MG PO TABS
1.0000 | ORAL_TABLET | Freq: Once | ORAL | Status: AC
  Administered 2018-08-20: 1 via ORAL
  Filled 2018-08-20: qty 1

## 2018-08-20 MED ORDER — SODIUM CHLORIDE 0.9 % IV BOLUS
500.0000 mL | Freq: Once | INTRAVENOUS | Status: AC
  Administered 2018-08-20: 500 mL via INTRAVENOUS

## 2018-08-20 MED ORDER — ONDANSETRON HCL 4 MG PO TABS: 4.0000 mg | ORAL_TABLET | Freq: Four times a day (QID) | ORAL | Status: DC | PRN

## 2018-08-20 NOTE — ED Notes (Addendum)
Pt caregiver Alyse Low called to unit states pt has been seeing rats crawling all over her for the last month, states she doesn't get out of bed unless she is made too and she has been coming to the house to get her up for a couple hours and take care of her. The majority of the time pt is alone. Pt caregiver states she can no longer take care of her and thinks she needs 24 hour care.   Alyse Low [phone]

## 2018-08-20 NOTE — ED Notes (Addendum)
Called pt contact brother Airiel Oblinger states sitter comes in only for a couple hours a day. Brother will call with sitter information. Per brother, pt states she says she sees rats in the bed. Pt has no family besides brother but he is unable to help care for her 24/7.

## 2018-08-20 NOTE — H&P (Signed)
History and Physical    Robin Blackburn GLO:[id number] DOB: Feb 10, 1946 DOA: 08/20/2018  PCP: Redmond School, MD  Patient coming from: Home  Chief Complaint: Hallucinating  HPI: Robin Blackburn is a 73 y.o. female with medical history significant of functional quadriplegic, history of CVA, fibromyalgia, rheumatoid arthritis, hypertension lives at home is bedbound has a caretaker that comes in 2 to 3 hours a day.  Patient was recently hospitalized less than a month ago and was offered nursing home which she refused.  Since that time family has been unable to take care of her at home.  She has been hallucinating more also that is been going on for over a week per ED report.  She is been seeing rats and there is none.  She is denying any hallucinations at this time.  There is been no fever.  She denies any nausea vomiting or diarrhea.  She was recently treated for UTI.  Patient is being referred for admission for hallucinations and the need for placement.   Review of Systems: As per HPI otherwise 10 point review of systems negative.   Past Medical History:  Diagnosis Date  . Acid reflux   . Anxiety   . 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 Depression   . Fibromyalgia   . Hypertension   . Neuropathy   . Rheumatoid arthritis ([place])   . Stroke [facility]) 02/17/14    Past Surgical History:  Procedure Laterality Date  . ABDOMINAL HYSTERECTOMY    . ANKLE RECONSTRUCTION    . APPENDECTOMY    . BACK SURGERY    . CHOLECYSTECTOMY    . KNEE SURGERY       reports that she has never smoked. She has never used smokeless tobacco. She reports that she does not drink alcohol or use drugs.  Allergies  Allergen Reactions  . Demerol [Meperidine] Anaphylaxis  . Hydromorphone Anaphylaxis  . Keflex [Cephalexin] Nausea And Vomiting  . Penicillins Anaphylaxis    Has patient had a PCN reaction causing immediate rash, facial/tongue/throat swelling, SOB or lightheadedness with hypotension:  Yes Has patient had a PCN reaction causing severe rash involving mucus membranes or skin necrosis: No Has patient had a PCN reaction that required hospitalization No Has patient had a PCN reaction occurring within the last 10 years: No If all of the above answers are "NO", then may proceed with Cephalosporin use.   . Sulfa Antibiotics Nausea And Vomiting  . Bisphosphonates     GI intolerance  . Codeine   . Fentanyl Other (See Comments)    "felt like I had demons in my head"  . Iohexol   . Percocet [Oxycodone-Acetaminophen] Swelling    Mouth swelling.  . Shellfish Allergy     Glucosamine not an option  . Terramycin [Oxytetracycline] Nausea And Vomiting  . Ciprofloxacin Nausea Only and Rash  . Levaquin [Levofloxacin In D5w] Nausea And Vomiting and Rash  . Morphine And Related Rash    Family History  Problem Relation Age of Onset  . Hypertension Father   . Transient ischemic attack Father   . Hypertension Brother   . CVA Maternal Grandfather   . Leukemia Brother     Prior to Admission medications   Medication Sig Start Date End Date Taking? Authorizing Provider  acetaminophen (TYLENOL) 500 MG tablet Take 500 mg by mouth every 6 (six) hours as needed for mild pain or moderate pain.   Yes [provider]  ALPRAZolam Duanne Moron) 0.5  MG tablet Take 1 tablet (0.5 mg total) by mouth 3 (three) times daily as needed for anxiety. Patient taking differently: Take 0.5 mg by mouth 2 (two) times daily.  11/28/16  Yes Arrien, Jimmy Picket, MD  busPIRone (BUSPAR) 15 MG tablet Take 15 mg by mouth 2 (two) times daily.    Yes [provider]  Cholecalciferol 5000 units TABS Take 1 tablet by mouth daily.    Yes [provider]  clopidogrel (PLAVIX) 75 MG tablet Take 75 mg by mouth at bedtime. 05/03/14  Yes [provider]  desloratadine (CLARINEX) 5 MG tablet Take 5 mg by mouth every morning.    Yes [provider]  dicyclomine (BENTYL) 20 MG tablet Take  20 mg by mouth every 6 (six) hours. Abdominal cramps 01/31/14  Yes [provider]  HYDROcodone-acetaminophen (NORCO/VICODIN) 5-325 MG tablet Take 1 tablet by mouth every 6 (six) hours as needed for moderate pain.   Yes [provider]  hydroxychloroquine (PLAQUENIL) 200 MG tablet Take 200 mg by mouth daily.  08/05/18  Yes [provider]  lansoprazole (PREVACID) 30 MG capsule Take 30 mg by mouth 2 (two) times daily. 02/17/14  Yes [provider]  methocarbamol (ROBAXIN) 750 MG tablet Take 750 mg by mouth 4 (four) times daily.   Yes [provider]  metoprolol tartrate (LOPRESSOR) 25 MG tablet Take 1 tablet (25 mg total) by mouth 2 (two) times daily. 09/28/15  Yes Hendricks Limes, MD  ondansetron (ZOFRAN) 4 MG tablet Take 4 mg by mouth every 6 (six) hours as needed for nausea or vomiting.    Yes [provider]  PARoxetine (PAXIL) 10 MG tablet Take 10 mg by mouth at bedtime.  12/14/15  Yes [provider]  QUEtiapine (SEROQUEL) 25 MG tablet Take 25 mg by mouth at bedtime.  08/05/18  Yes [provider]  RESTASIS 0.05 % ophthalmic emulsion Place 1 drop into both eyes 2 (two) times daily. 01/31/14  Yes [provider]  sodium chloride (OCEAN) 0.65 % SOLN nasal spray Place 1 spray into both nostrils as needed for congestion. Patient taking differently: Place 1 spray into both nostrils daily as needed for congestion.  02/22/14  Yes Rexene Alberts, MD  tiZANidine (ZANAFLEX) 4 MG tablet Take 4 mg by mouth 2 (two) times daily as needed for muscle spasms.    Yes [provider]  trolamine salicylate (ASPERCREME) 10 % cream Apply 1 application topically as needed for muscle pain.   Yes [provider]  Propylene Glycol 0.6 % SOLN Place 1 drop into both eyes daily. At noon.    [provider]  triamcinolone cream (KENALOG) 0.5 % Apply topically 2 (two) times daily. Apply to affected area twice daily as directed  (left ankle area) Patient not taking: Reported on 08/20/2018 07/31/18   Barton Dubois, MD    Physical Exam: Vitals:   08/20/18 1630 08/20/18 1700 08/20/18 1836 08/20/18 1837  BP: (!) 154/87 (!) 148/103 (!) 160/87   Pulse: 78   77  Resp:      Temp:      TempSrc:      SpO2: 96%   94%  Weight:      Height:          Constitutional: NAD, calm, comfortable Vitals:   08/20/18 1630 08/20/18 1700 08/20/18 1836 08/20/18 1837  BP: (!) 154/87 (!) 148/103 (!) 160/87   Pulse: 78   77  Resp:      Temp:  TempSrc:      SpO2: 96%   94%  Weight:      Height:       Eyes: PERRL, lids and conjunctivae normal ENMT: Mucous membranes are moist. Posterior pharynx clear of any exudate or lesions.Normal dentition.  Neck: normal, supple, no masses, no thyromegaly Respiratory: clear to auscultation bilaterally, no wheezing, no crackles. Normal respiratory effort. No accessory muscle use.  Cardiovascular: Regular rate and rhythm, no murmurs / rubs / gallops. No extremity edema. 2+ pedal pulses. No carotid bruits.  Abdomen: no tenderness, no masses palpated. No hepatosplenomegaly. Bowel sounds positive.  Musculoskeletal: no clubbing / cyanosis. No joint deformity upper and lower extremities. Good ROM, no contractures. Normal muscle tone.  Skin: no rashes, lesions, ulcers. No induration Neurologic: CN 2-12 grossly intact. Sensation intact, DTR normal. Strength 5/5 in all 4.  Psychiatric: Not normal judgment and insight. Alert and oriented x 3. Normal mood.    Labs on Admission: I have personally reviewed following labs and imaging studies  CBC: Recent Labs  Lab 08/20/18 1400  WBC 14.0*  NEUTROABS 9.7*  HGB 13.3  HCT 42.6  MCV 93.6  PLT 242*   Basic Metabolic Panel: Recent Labs  Lab 08/20/18 1400  NA 137  K 4.3  CL 101  CO2 25  GLUCOSE 112*  BUN 9  CREATININE 0.82  CALCIUM 8.8*   GFR: Estimated Creatinine Clearance: 69.9 mL/min (by C-G formula based on SCr of 0.82 mg/dL).  Liver Function Tests: Recent Labs  Lab 08/20/18 1400  AST 29  ALT 18  ALKPHOS 128*  BILITOT 0.6  PROT 7.6  ALBUMIN 3.8   No results for input(s): LIPASE, AMYLASE in the last 168 hours. No results for input(s): AMMONIA in the last 168 hours. Coagulation Profile: No results for input(s): INR, PROTIME in the last 168 hours. Cardiac Enzymes: No results for input(s): CKTOTAL, CKMB, CKMBINDEX, TROPONINI in the last 168 hours. BNP (last 3 results) No results for input(s): PROBNP in the last 8760 hours. HbA1C: No results for input(s): HGBA1C in the last 72 hours. CBG: No results for input(s): GLUCAP in the last 168 hours. Lipid Profile: No results for input(s): CHOL, HDL, LDLCALC, TRIG, CHOLHDL, LDLDIRECT in the last 72 hours. Thyroid Function Tests: No results for input(s): TSH, T4TOTAL, FREET4, T3FREE, THYROIDAB in the last 72 hours. Anemia Panel: No results for input(s): VITAMINB12, FOLATE, FERRITIN, TIBC, IRON, RETICCTPCT in the last 72 hours. Urine analysis:    Component Value Date/Time   COLORURINE STRAW (A) 08/20/2018 1225   APPEARANCEUR CLEAR 08/20/2018 1225   LABSPEC 1.004 (L) 08/20/2018 1225   PHURINE 6.0 08/20/2018 1225   GLUCOSEU NEGATIVE 08/20/2018 1225   HGBUR MODERATE (A) 08/20/2018 1225   BILIRUBINUR NEGATIVE 08/20/2018 1225   KETONESUR NEGATIVE 08/20/2018 1225   PROTEINUR NEGATIVE 08/20/2018 1225   UROBILINOGEN 0.2 04/10/2015 2212   NITRITE NEGATIVE 08/20/2018 1225   LEUKOCYTESUR NEGATIVE 08/20/2018 1225   Sepsis Labs: !!!!!!!!!!!!!!!!!!!!!!!!!!!!!!!!!!!!!!!!!!!! @LABRCNTIP (procalcitonin:4,lacticidven:4) )No results found for this or any previous visit (from the past 240 hour(s)).   Radiological Exams on Admission: Ct Abdomen Pelvis Wo Contrast  Result Date: 08/20/2018 CLINICAL DATA:  73 year old female with abdominal pain and distension. EXAM: CT ABDOMEN AND PELVIS WITHOUT CONTRAST TECHNIQUE: Multidetector CT imaging of the abdomen and pelvis was  performed following the standard protocol without IV contrast. COMPARISON:  12/17/2015 CT FINDINGS: Please note that parenchymal abnormalities may be missed without intravenous contrast. Lower chest: No acute abnormality. Hepatobiliary: Hepatic steatosis noted without suspicious focal hepatic  abnormality. The patient is status post cholecystectomy. No biliary dilatation. Pancreas: Atrophic without other significant abnormality Spleen: Unremarkable Adrenals/Urinary Tract: The kidneys and adrenal glands are unremarkable except for small nonobstructing RIGHT renal calculi. No hydronephrosis. Increased density within the dependent bladder may represent small bladder calculi. Stomach/Bowel: Stomach is within normal limits. No evidence of bowel wall thickening, distention, or inflammatory changes. Vascular/Lymphatic: Aortic atherosclerosis. No enlarged abdominal or pelvic lymph nodes. Reproductive: Status post hysterectomy. No adnexal masses. Other: No ascites, pneumoperitoneum or focal collection. Musculoskeletal: No acute or significant osseous findings. Mild multilevel degenerative changes within the lumbar spine noted. IMPRESSION: 1. No acute abnormality 2. Hepatic steatosis 3. Nonobstructing RIGHT renal calculi 4.  Aortic Atherosclerosis (ICD10-I70.0). Electronically Signed   By: Margarette Canada M.D.   On: 08/20/2018 16:39   Ct Head Wo Contrast  Result Date: 08/20/2018 CLINICAL DATA:  73 year old female with altered mental status, recent UTI EXAM: CT HEAD WITHOUT CONTRAST TECHNIQUE: Contiguous axial images were obtained from the base of the skull through the vertex without intravenous contrast. COMPARISON:  Prior head CT 07/30/2018 FINDINGS: Brain: No evidence of acute infarction, hemorrhage, hydrocephalus, extra-axial collection or mass lesion/mass effect. Similar pattern of advanced central and cortical atrophy with ex vacuo dilatation of the lateral ventricles. Confluent periventricular white matter  hypoattenuation remains nonspecific but most consistent with advanced chronic microvascular ischemic white matter change. Small lacunar infarcts in the left thalamus. Vascular: No hyperdense vessel or unexpected calcification. Skull: Normal. Negative for fracture or focal lesion. Sinuses/Orbits: Chronic right maxillary sinusitis. Other: None. IMPRESSION: 1. No acute intracranial abnormality. 2. 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 Electronically Signed   By: Jacqulynn Cadet M.D.   On: 08/20/2018 16:38   Dg Chest Portable 1 View  Result Date: 08/20/2018 CLINICAL DATA:  Altered mental status. Shortness of breath. Hypertension. EXAM: PORTABLE CHEST 1 VIEW COMPARISON:  07/30/2018. FINDINGS: Cardiomegaly. No pulmonary venous congestion. No focal infiltrate. No pleural effusion or pneumothorax. IMPRESSION: 1.  Cardiomegaly.  No pulmonary venous congestion. 2.  No acute pulmonary disease. Electronically Signed   By: Marcello Moores  Register   On: 08/20/2018 13:14   Old chart reviewed Case discussed with Dr. Roderic Palau in the ED  Assessment/Plan 73 year old female with hallucinations needing placement Principal Problem:   Acute metabolic encephalopathy/hallucinations-work-up in the emergency department is essentially negative.  Urinalysis is negative no fever white count is 14 but no real source of infection.  No rashes.  Observe overnight obtain case management evaluation for nursing home placement.  Active Problems:   Benzodiazepine withdrawal with perceptual disturbance (HCC)-continue Xanax   Essential hypertension-continue home meds   Anxiety-Xanax   Rheumatoid arthritis (HCC)-stable   H/O: CVA (cerebrovascular accident)-continue aspirin   Chronic pain syndrome-continue opiates   Fibromyalgia-noted   Bedbound-obtain physical therapy evaluation    Patient agreeable to nursing home or rehab placement at this time  DVT prophylaxis: SCDs  Code Status: Full Family Communication: None Disposition Plan: Pending SNF placement Consults called: None Admission status: Observation   Joycelyn Liska A MD [facility]  If 7PM-7AM, please contact night-coverage [email or web address] Password [facility] Cl[facility]  08/20/2018, 8:01 PM

## 2018-08-20 NOTE — ED Notes (Signed)
ED TO INPATIENT HANDOFF REPORT  ED Nurse Name and Phone #: [phone] Esau Grew Name/Age/Gender Robin Blackburn 73 y.o. female Room/Bed: APA04/APA04  Code Status   Code Status: Prior  Home/SNF/Other Home Patient oriented to: self Is this baseline? Yes   Triage Complete: Triage complete  Chief Complaint ams  Triage Note Pt brought to ED for altered mental status starting this morning. Caregiver reported to EMS pt with hallucinations. Pt recently discharged from hospital from with UTI. Pt with strong urine smell and pt reports burning with urination. Pt reports low abdominal pain and low back pain started when she finished antibiotics.    Allergies Allergies  Allergen Reactions  . Demerol [Meperidine] Anaphylaxis  . Hydromorphone Anaphylaxis  . Keflex [Cephalexin] Nausea And Vomiting  . Penicillins Anaphylaxis    Has patient had a PCN reaction causing immediate rash, facial/tongue/throat swelling, SOB or lightheadedness with hypotension: Yes Has patient had a PCN reaction causing severe rash involving mucus membranes or skin necrosis: No Has patient had a PCN reaction that required hospitalization No Has patient had a PCN reaction occurring within the last 10 years: No If all of the above answers are "NO", then may proceed with Cephalosporin use.   . Sulfa Antibiotics Nausea And Vomiting  . Bisphosphonates     GI intolerance  . Codeine   . Fentanyl Other (See Comments)    "felt like I had demons in my head"  . Iohexol   . Percocet [Oxycodone-Acetaminophen] Swelling    Mouth swelling.  . Shellfish Allergy     Glucosamine not an option  . Terramycin [Oxytetracycline] Nausea And Vomiting  . Ciprofloxacin Nausea Only and Rash  . Levaquin [Levofloxacin In D5w] Nausea And Vomiting and Rash  . Morphine And Related Rash    Level of Care/Admitting Diagnosis ED Disposition    ED Disposition Condition [facility] Area: [facility] [[id number]]  Level of  Care: Med-Surg [16]  Diagnosis: Confusion [[id number]]  Admitting Physician: Phillips Grout [[id number]]  Attending Physician: Derrill Kay A [[id number]]  PT Class (Do Not Modify): Observation [104]  PT Acc Code (Do Not Modify): Observation [10022]       B Medical/Surgery History Past Medical History:  Diagnosis Date  . Acid reflux   . Anxiety   . 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 Depression   . Fibromyalgia   . Hypertension   . Neuropathy   . Rheumatoid arthritis ([place])   . Stroke [facility]) 02/17/14   Past Surgical History:  Procedure Laterality Date  . ABDOMINAL HYSTERECTOMY    . ANKLE RECONSTRUCTION    . APPENDECTOMY    . BACK SURGERY    . CHOLECYSTECTOMY    . KNEE SURGERY       A IV Location/Drains/Wounds Patient Lines/Drains/Airways Status   Active Line/Drains/Airways    Name:   Placement date:   Placement time:   Site:   Days:   Peripheral IV 08/20/18 Right Forearm   08/20/18    1430    Forearm   less than 1   External Urinary Catheter   08/01/18    0245    -   19   Pressure Injury 07/30/18   07/30/18    2000     21   Pressure Injury Stage I -  Intact skin with non-blanchable redness of a localized area usually over a bony prominence.   -    -  Intake/Output Last 24 hours  Intake/Output Summary (Last 24 hours) at 08/20/2018 1939 Last data filed at 08/20/2018 1533 Gross per 24 hour  Intake 500 ml  Output -  Net 500 ml    Labs/Imaging Results for orders placed or performed during the hospital encounter of 08/20/18 (from the past 48 hour(s))  Urinalysis, Routine w reflex microscopic     Status: Abnormal   Collection Time: 08/20/18 12:25 PM  Result Value Ref Range   Color, Urine STRAW (A) YELLOW   APPearance CLEAR CLEAR   Specific Gravity, Urine 1.004 (L) 1.005 - 1.030   pH 6.0 5.0 - 8.0   Glucose, UA NEGATIVE NEGATIVE mg/dL   Hgb urine dipstick MODERATE (A) NEGATIVE   Bilirubin Urine NEGATIVE NEGATIVE   Ketones, ur NEGATIVE  NEGATIVE mg/dL   Protein, ur NEGATIVE NEGATIVE mg/dL   Nitrite NEGATIVE NEGATIVE   Leukocytes,Ua NEGATIVE NEGATIVE   RBC / HPF 0-5 0 - 5 RBC/hpf   WBC, UA 0-5 0 - 5 WBC/hpf   Bacteria, UA NONE SEEN NONE SEEN   Mucus PRESENT     Comment: Performed at [facility], [address] [place], [place] [address]  CBC with Differential/Platelet     Status: Abnormal   Collection Time: 08/20/18  2:00 PM  Result Value Ref Range   WBC 14.0 (H) 4.0 - 10.5 K/uL   RBC 4.55 3.87 - 5.11 MIL/uL   Hemoglobin 13.3 12.0 - 15.0 g/dL   HCT 42.6 36.0 - 46.0 %   MCV 93.6 80.0 - 100.0 fL   MCH 29.2 26.0 - 34.0 pg   MCHC 31.2 30.0 - 36.0 g/dL   RDW 13.6 11.5 - 15.5 %   Platelets 477 (H) 150 - 400 K/uL   nRBC 0.0 0.0 - 0.2 %   Neutrophils Relative % 70 %   Neutro Abs 9.7 (H) 1.7 - 7.7 K/uL   Lymphocytes Relative 20 %   Lymphs Abs 2.8 0.7 - 4.0 K/uL   Monocytes Relative 8 %   Monocytes Absolute 1.1 (H) 0.1 - 1.0 K/uL   Eosinophils Relative 1 %   Eosinophils Absolute 0.2 0.0 - 0.5 K/uL   Basophils Relative 1 %   Basophils Absolute 0.1 0.0 - 0.1 K/uL   Immature Granulocytes 0 %   Abs Immature Granulocytes 0.05 0.00 - 0.07 K/uL    Comment: Performed at [facility], [address], [place], [place] [address]  Comprehensive metabolic panel     Status: Abnormal   Collection Time: 08/20/18  2:00 PM  Result Value Ref Range   Sodium 137 135 - 145 mmol/L   Potassium 4.3 3.5 - 5.1 mmol/L   Chloride 101 98 - 111 mmol/L   CO2 25 22 - 32 mmol/L   Glucose, Bld 112 (H) 70 - 99 mg/dL   BUN 9 8 - 23 mg/dL   Creatinine, Ser 0.82 0.44 - 1.00 mg/dL   Calcium 8.8 (L) 8.9 - 10.3 mg/dL   Total Protein 7.6 6.5 - 8.1 g/dL   Albumin 3.8 3.5 - 5.0 g/dL   AST 29 15 - 41 U/L   ALT 18 0 - 44 U/L   Alkaline Phosphatase 128 (H) 38 - 126 U/L   Total Bilirubin 0.6 0.3 - 1.2 mg/dL   GFR calc non Af Amer >60 >60 mL/min   GFR calc Af Amer >60 >60 mL/min   Anion gap 11 5 - 15    Comment: Performed at [facility], [address] [place], [place]  [address]  Lactic acid, plasma     Status: None   Collection Time: 08/20/18  2:00 PM  Result Value Ref Range   Lactic Acid, Venous 1.7 0.5 - 1.9 mmol/L    Comment: Performed at [facility], [address] [place], [place] [address]   Ct Abdomen Pelvis Wo Contrast  Result Date: 08/20/2018 CLINICAL DATA:  73 year old female with abdominal pain and distension. EXAM: CT ABDOMEN AND PELVIS WITHOUT CONTRAST TECHNIQUE: Multidetector CT imaging of the abdomen and pelvis was performed following the standard protocol without IV contrast. COMPARISON:  12/17/2015 CT FINDINGS: Please note that parenchymal abnormalities may be missed without intravenous contrast. Lower chest: No acute abnormality. Hepatobiliary: Hepatic steatosis noted without suspicious focal hepatic abnormality. The patient is status post cholecystectomy. No biliary dilatation. Pancreas: Atrophic without other significant abnormality Spleen: Unremarkable Adrenals/Urinary Tract: The kidneys and adrenal glands are unremarkable except for small nonobstructing RIGHT renal calculi. No hydronephrosis. Increased density within the dependent bladder may represent small bladder calculi. Stomach/Bowel: Stomach is within normal limits. No evidence of bowel wall thickening, distention, or inflammatory changes. Vascular/Lymphatic: Aortic atherosclerosis. No enlarged abdominal or pelvic lymph nodes. Reproductive: Status post hysterectomy. No adnexal masses. Other: No ascites, pneumoperitoneum or focal collection. Musculoskeletal: No acute or significant osseous findings. Mild multilevel degenerative changes within the lumbar spine noted. IMPRESSION: 1. No acute abnormality 2. Hepatic steatosis 3. Nonobstructing RIGHT renal calculi 4.  Aortic Atherosclerosis (ICD10-I70.0). Electronically Signed   By: Margarette Canada M.D.   On: 08/20/2018 16:39   Ct Head Wo Contrast  Result Date: 08/20/2018 CLINICAL DATA:  73 year old female with altered mental  status, recent UTI EXAM: CT HEAD WITHOUT CONTRAST TECHNIQUE: Contiguous axial images were obtained from the base of the skull through the vertex without intravenous contrast. COMPARISON:  Prior head CT 07/30/2018 FINDINGS: Brain: No evidence of acute infarction, hemorrhage, hydrocephalus, extra-axial collection or mass lesion/mass effect. Similar pattern of advanced central and cortical atrophy with ex vacuo dilatation of the lateral ventricles. Confluent periventricular white matter hypoattenuation remains nonspecific but most consistent with advanced chronic microvascular ischemic white matter change. Small lacunar infarcts in the left thalamus. Vascular: No hyperdense vessel or unexpected calcification. Skull: Normal. Negative for fracture or focal lesion. Sinuses/Orbits: Chronic right maxillary sinusitis. Other: None. IMPRESSION: 1. No acute intracranial abnormality. 2. 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 Electronically Signed   By: Jacqulynn Cadet M.D.   On: 08/20/2018 16:38   Dg Chest Portable 1 View  Result Date: 08/20/2018 CLINICAL DATA:  Altered mental status. Shortness of breath. Hypertension. EXAM: PORTABLE CHEST 1 VIEW COMPARISON:  07/30/2018. FINDINGS: Cardiomegaly. No pulmonary venous congestion. No focal infiltrate. No pleural effusion or pneumothorax. IMPRESSION: 1.  Cardiomegaly.  No pulmonary venous congestion. 2.  No acute pulmonary disease. Electronically Signed   By: Marcello Moores  Register   On: 08/20/2018 13:14    Pending Labs Unresulted Labs (From admission, onward)   None      Vitals/Pain Today's Vitals   08/20/18 1700 08/20/18 1829 08/20/18 1836 08/20/18 1837  BP: (!) 148/103  (!) 160/87   Pulse:    77  Resp:      Temp:      TempSrc:      SpO2:    94%  Weight:      Height:      PainSc:  0-No pain      Isolation Precautions No active isolations  Medications Medications  sodium chloride 0.9 %  bolus 500  mL (0 mLs Intravenous Stopped 08/20/18 1533)  HYDROcodone-acetaminophen (NORCO/VICODIN) 5-325 MG per tablet 1 tablet (1 tablet Oral Given 08/20/18 1533)  ondansetron (ZOFRAN-ODT) disintegrating tablet 4 mg (4 mg Oral Given 08/20/18 1533)  ondansetron (ZOFRAN-ODT) disintegrating tablet 4 mg (4 mg Oral Given 08/20/18 1837)    Mobility non-ambulatory Moderate fall risk   Focused Assessments    R Recommendations: See Admitting Provider Note  Report given to:   Additional Notes:

## 2018-08-20 NOTE — ED Provider Notes (Signed)
[facility] EMERGENCY DEPARTMENT Provider Note   CSN: [id number] Arrival date & time: 08/20/18  1136    History   Chief Complaint Chief Complaint  Patient presents with   Altered Mental Status    HPI Robin Blackburn is a 73 y.o. female.     Is brought to the emergency department because she is more confused.  The patient supposedly stays in bed for 22 hours a day.  She has a caregiver for 2 hours a day.  According to the caregiver she has become more confused.  The patient was admitted with confusion and UTI 3 weeks ago  The history is provided by the patient and a caregiver. No language interpreter was used.  Weakness  Severity:  Severe Onset quality:  Sudden Timing:  Constant Progression:  Worsening Chronicity:  Recurrent Context: not alcohol use   Relieved by:  Nothing Worsened by:  Nothing Ineffective treatments:  None tried Associated symptoms: abdominal pain   Associated symptoms: no chest pain, no cough, no diarrhea, no frequency, no headaches and no seizures   Risk factors: no anemia     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   Depression    Fibromyalgia    Hypertension    Neuropathy    Rheumatoid arthritis ([place])    Stroke ([place]) 02/17/14    Patient Active Problem List   Diagnosis Date Noted   Confusion 08/20/2018   Toxic encephalopathy    Dehydration    Lactic acidosis    SIRS (systemic inflammatory response syndrome) ([place])    Acute metabolic encephalopathy    Acute lower UTI 11/25/2016   Confusion associated with infection (UTI) 11/25/2016   Urinary tract infection without hematuria 04/02/2016   Benzodiazepine withdrawal with perceptual disturbance ([place]) 04/02/2016   Acute pain of left knee 12/18/2015   Osteoporosis 11/01/2015   Left foot pain 11/01/2015   Vitamin D deficiency 10/11/2015   History of fracture of left ankle 09/28/2015     Chronic pain in left foot 09/28/2015   Fibromyalgia 09/28/2015   Rash and nonspecific skin eruption 07/17/2015   Cerumen impaction 05/30/2015   Edema 04/16/2015   Cellulitis 04/16/2015   Mouth pain 04/08/2015   Pain in joint, lower leg 04/08/2015   GERD (gastroesophageal reflux disease) 04/05/2015   Occult blood positive stool 01/17/2015   Diarrhea 01/05/2015   Chronic pain syndrome 12/02/2014   Pain in joint, upper arm 10/29/2014   Nasal congestion 10/29/2014   Allergic rhinitis 08/20/2014   Conjunctivitis 08/08/2014   Dysuria 06/27/2014   Back pain, lumbosacral 06/23/2014   Sinusitis, chronic 06/17/2014   Fall 05/24/2014   H/O: CVA (cerebrovascular accident) 05/24/2014   HLD (hyperlipidemia) 05/24/2014   Physical deconditioning 05/24/2014   Fracture of fifth metatarsal bone of right foot 03/06/2014   Right sided weakness 02/26/2014   Leukocytosis 02/20/2014   Rheumatoid arthritis ([place]) 02/20/2014   CVA (cerebral infarction) 02/19/2014   Right ankle sprain 02/19/2014   Contusion of right foot 02/19/2014   Essential hypertension 02/19/2014   Neuropathy 02/19/2014   Anxiety 02/19/2014   Hyponatremia 02/19/2014   Hypokalemia 02/19/2014    Past Surgical History:  Procedure Laterality Date   ABDOMINAL HYSTERECTOMY     ANKLE RECONSTRUCTION     APPENDECTOMY     BACK SURGERY     CHOLECYSTECTOMY     KNEE SURGERY  OB History   No obstetric history on file.      Home Medications    Prior to Admission medications   Medication Sig Start Date End Date Taking? Authorizing Provider  acetaminophen (TYLENOL) 500 MG tablet Take 500 mg by mouth every 6 (six) hours as needed for mild pain or moderate pain.   Yes [provider]  ALPRAZolam (XANAX) 0.5 MG tablet Take 1 tablet (0.5 mg total) by mouth 3 (three) times daily as needed for anxiety. Patient taking differently: Take 0.5 mg by mouth 2 (two) times daily.  11/28/16   Yes Arrien, Jimmy Picket, MD  busPIRone (BUSPAR) 15 MG tablet Take 15 mg by mouth 2 (two) times daily.    Yes [provider]  Cholecalciferol 5000 units TABS Take 1 tablet by mouth daily.    Yes [provider]  clopidogrel (PLAVIX) 75 MG tablet Take 75 mg by mouth at bedtime. 05/03/14  Yes [provider]  desloratadine (CLARINEX) 5 MG tablet Take 5 mg by mouth every morning.    Yes [provider]  dicyclomine (BENTYL) 20 MG tablet Take 20 mg by mouth every 6 (six) hours. Abdominal cramps 01/31/14  Yes [provider]  HYDROcodone-acetaminophen (NORCO/VICODIN) 5-325 MG tablet Take 1 tablet by mouth every 6 (six) hours as needed for moderate pain.   Yes [provider]  hydroxychloroquine (PLAQUENIL) 200 MG tablet Take 200 mg by mouth daily.  08/05/18  Yes [provider]  lansoprazole (PREVACID) 30 MG capsule Take 30 mg by mouth 2 (two) times daily. 02/17/14  Yes [provider]  methocarbamol (ROBAXIN) 750 MG tablet Take 750 mg by mouth 4 (four) times daily.   Yes [provider]  metoprolol tartrate (LOPRESSOR) 25 MG tablet Take 1 tablet (25 mg total) by mouth 2 (two) times daily. 09/28/15  Yes Hendricks Limes, MD  ondansetron (ZOFRAN) 4 MG tablet Take 4 mg by mouth every 6 (six) hours as needed for nausea or vomiting.    Yes [provider]  PARoxetine (PAXIL) 10 MG tablet Take 10 mg by mouth at bedtime.  12/14/15  Yes [provider]  QUEtiapine (SEROQUEL) 25 MG tablet Take 25 mg by mouth at bedtime.  08/05/18  Yes [provider]  RESTASIS 0.05 % ophthalmic emulsion Place 1 drop into both eyes 2 (two) times daily. 01/31/14  Yes [provider]  sodium chloride (OCEAN) 0.65 % SOLN nasal spray Place 1 spray into both nostrils as needed for congestion. Patient taking differently: Place 1 spray into both nostrils daily as needed for congestion.  02/22/14  Yes Rexene Alberts, MD    tiZANidine (ZANAFLEX) 4 MG tablet Take 4 mg by mouth 2 (two) times daily as needed for muscle spasms.    Yes [provider]  trolamine salicylate (ASPERCREME) 10 % cream Apply 1 application topically as needed for muscle pain.   Yes [provider]  Propylene Glycol 0.6 % SOLN Place 1 drop into both eyes daily. At noon.    [provider]  triamcinolone cream (KENALOG) 0.5 % Apply topically 2 (two) times daily. Apply to affected area twice daily as directed (left ankle area) Patient not taking: Reported on 08/20/2018 07/31/18   Barton Dubois, MD    Family History Family History  Problem Relation Age of Onset   Hypertension Father    Transient ischemic attack Father    Hypertension Brother    CVA Maternal Grandfather    Leukemia Brother  Social History Social History   Tobacco Use   Smoking status: Never Smoker   Smokeless tobacco: Never Used  Substance Use Topics   Alcohol use: No   Drug use: No     Allergies   Demerol [meperidine]; Hydromorphone; Keflex [cephalexin]; Penicillins; Sulfa antibiotics; Bisphosphonates; Codeine; Fentanyl; Iohexol; Percocet [oxycodone-acetaminophen]; Shellfish allergy; Terramycin [oxytetracycline]; Ciprofloxacin; Levaquin [levofloxacin in d5w]; and Morphine and related   Review of Systems Review of Systems  Constitutional: Negative for appetite change and fatigue.  HENT: Negative for congestion, ear discharge and sinus pressure.   Eyes: Negative for discharge.  Respiratory: Negative for cough.   Cardiovascular: Negative for chest pain.  Gastrointestinal: Positive for abdominal pain. Negative for diarrhea.  Genitourinary: Negative for frequency and hematuria.  Musculoskeletal: Negative for back pain.  Skin: Negative for rash.  Neurological: Positive for weakness. Negative for seizures and headaches.  Psychiatric/Behavioral: Positive for confusion. Negative for hallucinations.     Physical  Exam Updated Vital Signs BP (!) 160/87    Pulse 77    Temp 98.5 F (36.9 C) (Oral)    Resp (!) 31    Ht 5' 6.5" (1.689 m)    Wt 90.4 kg    SpO2 94%    BMI 31.69 kg/m   Physical Exam Vitals signs and nursing note reviewed.  Constitutional:      Appearance: She is well-developed.  HENT:     Head: Normocephalic.     Nose: Nose normal.  Eyes:     General: No scleral icterus.    Conjunctiva/sclera: Conjunctivae normal.  Neck:     Musculoskeletal: Neck supple.     Thyroid: No thyromegaly.  Cardiovascular:     Rate and Rhythm: Normal rate and regular rhythm.     Heart sounds: No murmur. No friction rub. No gallop.   Pulmonary:     Breath sounds: No stridor. No wheezing or rales.  Chest:     Chest wall: No tenderness.  Abdominal:     General: There is no distension.     Tenderness: There is no abdominal tenderness. There is no rebound.  Musculoskeletal: Normal range of motion.     Comments: Patient is too weak to ambulate  Lymphadenopathy:     Cervical: No cervical adenopathy.  Skin:    Findings: No erythema or rash.  Neurological:     Mental Status: She is alert and oriented to person, place, and time.     Motor: No abnormal muscle tone.     Coordination: Coordination normal.     Comments: Patient having some hallucinations of rats in her room  Psychiatric:     Comments: Hallucination      ED Treatments / Results  Labs (all labs ordered are listed, but only abnormal results are displayed) Labs Reviewed  CBC WITH DIFFERENTIAL/PLATELET - Abnormal; Notable for the following components:      Result Value   WBC 14.0 (*)    Platelets 477 (*)    Neutro Abs 9.7 (*)    Monocytes Absolute 1.1 (*)    All other components within normal limits  COMPREHENSIVE METABOLIC PANEL - Abnormal; Notable for the following components:   Glucose, Bld 112 (*)    Calcium 8.8 (*)    Alkaline Phosphatase 128 (*)    All other components within normal limits  URINALYSIS, ROUTINE W REFLEX  MICROSCOPIC - Abnormal; Notable for the following components:   Color, Urine STRAW (*)    Specific Gravity, Urine 1.004 (*)    Hgb  urine dipstick MODERATE (*)    All other components within normal limits  LACTIC ACID, PLASMA    EKG EKG Interpretation  Date/Time:  Friday August 20 2018 11:47:42 EDT Ventricular Rate:  62 PR Interval:    QRS Duration: 85 QT Interval:  585 QTC Calculation: 595 R Axis:   42 Text Interpretation:  Sinus rhythm Borderline repolarization abnormality Prolonged QT interval Confirmed by Milton Ferguson [phone]) on 08/20/2018 3:43:15 PM   Radiology Ct Abdomen Pelvis Wo Contrast  Result Date: 08/20/2018 CLINICAL DATA:  73 year old female with abdominal pain and distension. EXAM: CT ABDOMEN AND PELVIS WITHOUT CONTRAST TECHNIQUE: Multidetector CT imaging of the abdomen and pelvis was performed following the standard protocol without IV contrast. COMPARISON:  12/17/2015 CT FINDINGS: Please note that parenchymal abnormalities may be missed without intravenous contrast. Lower chest: No acute abnormality. Hepatobiliary: Hepatic steatosis noted without suspicious focal hepatic abnormality. The patient is status post cholecystectomy. No biliary dilatation. Pancreas: Atrophic without other significant abnormality Spleen: Unremarkable Adrenals/Urinary Tract: The kidneys and adrenal glands are unremarkable except for small nonobstructing RIGHT renal calculi. No hydronephrosis. Increased density within the dependent bladder may represent small bladder calculi. Stomach/Bowel: Stomach is within normal limits. No evidence of bowel wall thickening, distention, or inflammatory changes. Vascular/Lymphatic: Aortic atherosclerosis. No enlarged abdominal or pelvic lymph nodes. Reproductive: Status post hysterectomy. No adnexal masses. Other: No ascites, pneumoperitoneum or focal collection. Musculoskeletal: No acute or significant osseous findings. Mild multilevel degenerative changes within the  lumbar spine noted. IMPRESSION: 1. No acute abnormality 2. Hepatic steatosis 3. Nonobstructing RIGHT renal calculi 4.  Aortic Atherosclerosis (ICD10-I70.0). Electronically Signed   By: Margarette Canada M.D.   On: 08/20/2018 16:39   Ct Head Wo Contrast  Result Date: 08/20/2018 CLINICAL DATA:  73 year old female with altered mental status, recent UTI EXAM: CT HEAD WITHOUT CONTRAST TECHNIQUE: Contiguous axial images were obtained from the base of the skull through the vertex without intravenous contrast. COMPARISON:  Prior head CT 07/30/2018 FINDINGS: Brain: No evidence of acute infarction, hemorrhage, hydrocephalus, extra-axial collection or mass lesion/mass effect. Similar pattern of advanced central and cortical atrophy with ex vacuo dilatation of the lateral ventricles. Confluent periventricular white matter hypoattenuation remains nonspecific but most consistent with advanced chronic microvascular ischemic white matter change. Small lacunar infarcts in the left thalamus. Vascular: No hyperdense vessel or unexpected calcification. Skull: Normal. Negative for fracture or focal lesion. Sinuses/Orbits: Chronic right maxillary sinusitis. Other: None. IMPRESSION: 1. No acute intracranial abnormality. 2. 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 Electronically Signed   By: Jacqulynn Cadet M.D.   On: 08/20/2018 16:38   Dg Chest Portable 1 View  Result Date: 08/20/2018 CLINICAL DATA:  Altered mental status. Shortness of breath. Hypertension. EXAM: PORTABLE CHEST 1 VIEW COMPARISON:  07/30/2018. FINDINGS: Cardiomegaly. No pulmonary venous congestion. No focal infiltrate. No pleural effusion or pneumothorax. IMPRESSION: 1.  Cardiomegaly.  No pulmonary venous congestion. 2.  No acute pulmonary disease. Electronically Signed   By: Marcello Moores  Register   On: 08/20/2018 13:14    Procedures Procedures (including critical care time)  Medications Ordered in  ED Medications  sodium chloride 0.9 % bolus 500 mL (0 mLs Intravenous Stopped 08/20/18 1533)  HYDROcodone-acetaminophen (NORCO/VICODIN) 5-325 MG per tablet 1 tablet (1 tablet Oral Given 08/20/18 1533)  ondansetron (ZOFRAN-ODT) disintegrating tablet 4 mg (4 mg Oral Given 08/20/18 1533)  ondansetron (ZOFRAN-ODT) disintegrating tablet 4 mg (4 mg Oral Given 08/20/18 1837)     Initial Impression / Assessment  and Plan / ED Course  I have reviewed the triage vital signs and the nursing notes.  Pertinent labs & imaging results that were available during my care of the patient were reviewed by me and considered in my medical decision making (see chart for details).        Will be admitted for weakness confusion and nursing home placement  Final Clinical Impressions(s) / ED Diagnoses   Final diagnoses:  Weakness    ED Discharge Orders    None       Milton Ferguson, MD 08/20/18 2002

## 2018-08-20 NOTE — ED Triage Notes (Signed)
Pt brought to ED for altered mental status starting this morning. Caregiver reported to EMS pt with hallucinations. Pt recently discharged from hospital from with UTI. Pt with strong urine smell and pt reports burning with urination. Pt reports low abdominal pain and low back pain started when she finished antibiotics.

## 2018-08-20 NOTE — ED Notes (Signed)
Pt states she seen a couple rats run across the top of the curtain and one fell in the bed with her.

## 2018-08-21 DIAGNOSIS — S90821A Blister (nonthermal), right foot, initial encounter: Secondary | ICD-10-CM

## 2018-08-21 DIAGNOSIS — F13232 Sedative, hypnotic or anxiolytic dependence with withdrawal with perceptual disturbance: Secondary | ICD-10-CM | POA: Diagnosis present

## 2018-08-21 DIAGNOSIS — G894 Chronic pain syndrome: Secondary | ICD-10-CM | POA: Diagnosis present

## 2018-08-21 DIAGNOSIS — G629 Polyneuropathy, unspecified: Secondary | ICD-10-CM | POA: Diagnosis present

## 2018-08-21 DIAGNOSIS — Z87440 Personal history of urinary (tract) infections: Secondary | ICD-10-CM | POA: Diagnosis not present

## 2018-08-21 DIAGNOSIS — M797 Fibromyalgia: Secondary | ICD-10-CM | POA: Diagnosis present

## 2018-08-21 DIAGNOSIS — R234 Changes in skin texture: Secondary | ICD-10-CM | POA: Diagnosis not present

## 2018-08-21 DIAGNOSIS — Z888 Allergy status to other drugs, medicaments and biological substances status: Secondary | ICD-10-CM | POA: Diagnosis not present

## 2018-08-21 DIAGNOSIS — Z885 Allergy status to narcotic agent status: Secondary | ICD-10-CM | POA: Diagnosis not present

## 2018-08-21 DIAGNOSIS — Z881 Allergy status to other antibiotic agents status: Secondary | ICD-10-CM | POA: Diagnosis not present

## 2018-08-21 DIAGNOSIS — Z8673 Personal history of transient ischemic attack (TIA), and cerebral infarction without residual deficits: Secondary | ICD-10-CM | POA: Diagnosis not present

## 2018-08-21 DIAGNOSIS — X58XXXA Exposure to other specified factors, initial encounter: Secondary | ICD-10-CM | POA: Diagnosis present

## 2018-08-21 DIAGNOSIS — R531 Weakness: Secondary | ICD-10-CM | POA: Diagnosis present

## 2018-08-21 DIAGNOSIS — Y92009 Unspecified place in unspecified non-institutional (private) residence as the place of occurrence of the external cause: Secondary | ICD-10-CM | POA: Diagnosis not present

## 2018-08-21 DIAGNOSIS — K219 Gastro-esophageal reflux disease without esophagitis: Secondary | ICD-10-CM | POA: Diagnosis present

## 2018-08-21 DIAGNOSIS — F419 Anxiety disorder, unspecified: Secondary | ICD-10-CM | POA: Diagnosis present

## 2018-08-21 DIAGNOSIS — Z79899 Other long term (current) drug therapy: Secondary | ICD-10-CM | POA: Diagnosis not present

## 2018-08-21 DIAGNOSIS — R532 Functional quadriplegia: Secondary | ICD-10-CM | POA: Diagnosis present

## 2018-08-21 DIAGNOSIS — F329 Major depressive disorder, single episode, unspecified: Secondary | ICD-10-CM | POA: Diagnosis present

## 2018-08-21 DIAGNOSIS — M810 Age-related osteoporosis without current pathological fracture: Secondary | ICD-10-CM | POA: Diagnosis present

## 2018-08-21 DIAGNOSIS — Z7401 Bed confinement status: Secondary | ICD-10-CM | POA: Diagnosis not present

## 2018-08-21 DIAGNOSIS — I10 Essential (primary) hypertension: Secondary | ICD-10-CM | POA: Diagnosis present

## 2018-08-21 DIAGNOSIS — T40605A Adverse effect of unspecified narcotics, initial encounter: Secondary | ICD-10-CM | POA: Diagnosis present

## 2018-08-21 DIAGNOSIS — G92 Toxic encephalopathy: Secondary | ICD-10-CM | POA: Diagnosis present

## 2018-08-21 DIAGNOSIS — Z882 Allergy status to sulfonamides status: Secondary | ICD-10-CM | POA: Diagnosis not present

## 2018-08-21 DIAGNOSIS — M069 Rheumatoid arthritis, unspecified: Secondary | ICD-10-CM | POA: Diagnosis present

## 2018-08-21 DIAGNOSIS — G9340 Encephalopathy, unspecified: Secondary | ICD-10-CM | POA: Diagnosis present

## 2018-08-21 DIAGNOSIS — G9341 Metabolic encephalopathy: Secondary | ICD-10-CM | POA: Diagnosis not present

## 2018-08-21 DIAGNOSIS — T43505A Adverse effect of unspecified antipsychotics and neuroleptics, initial encounter: Secondary | ICD-10-CM | POA: Diagnosis present

## 2018-08-21 DIAGNOSIS — Z7902 Long term (current) use of antithrombotics/antiplatelets: Secondary | ICD-10-CM | POA: Diagnosis not present

## 2018-08-21 LAB — CBC
HCT: 44.6 % (ref 36.0–46.0)
HEMOGLOBIN: 13.7 g/dL (ref 12.0–15.0)
MCH: 28.8 pg (ref 26.0–34.0)
MCHC: 30.7 g/dL (ref 30.0–36.0)
MCV: 93.9 fL (ref 80.0–100.0)
Platelets: 463 10*3/uL — ABNORMAL HIGH (ref 150–400)
RBC: 4.75 MIL/uL (ref 3.87–5.11)
RDW: 13.6 % (ref 11.5–15.5)
WBC: 11.1 10*3/uL — ABNORMAL HIGH (ref 4.0–10.5)
nRBC: 0 % (ref 0.0–0.2)

## 2018-08-21 LAB — BASIC METABOLIC PANEL
Anion gap: 12 (ref 5–15)
BUN: 7 mg/dL — AB (ref 8–23)
CO2: 24 mmol/L (ref 22–32)
Calcium: 8.8 mg/dL — ABNORMAL LOW (ref 8.9–10.3)
Chloride: 102 mmol/L (ref 98–111)
Creatinine, Ser: 0.73 mg/dL (ref 0.44–1.00)
GFR calc Af Amer: >60 mL/min (ref >60)
GFR calc non Af Amer: >60 mL/min (ref >60)
Glucose, Bld: 107 mg/dL — ABNORMAL HIGH (ref 70–99)
Potassium: 3.4 mmol/L — ABNORMAL LOW (ref 3.5–5.1)
Sodium: 138 mmol/L (ref 135–145)

## 2018-08-21 MED ORDER — ALUM & MAG HYDROXIDE-SIMETH 200-200-20 MG/5ML PO SUSP
30.0000 mL | Freq: Four times a day (QID) | ORAL | Status: DC | PRN
  Administered 2018-08-21: 30 mL via ORAL
  Filled 2018-08-21: qty 30

## 2018-08-21 MED ORDER — ENSURE MAX PROTEIN PO LIQD
11.0000 [oz_av] | Freq: Two times a day (BID) | ORAL | Status: DC
  Administered 2018-08-21: 11 [oz_av] via ORAL

## 2018-08-21 MED ORDER — METHOCARBAMOL 500 MG PO TABS
750.0000 mg | ORAL_TABLET | Freq: Four times a day (QID) | ORAL | Status: DC | PRN
  Administered 2018-08-23 – 2018-08-25 (×6): 750 mg via ORAL
  Filled 2018-08-21 (×6): qty 2

## 2018-08-21 MED ORDER — POTASSIUM CHLORIDE CRYS ER 20 MEQ PO TBCR
40.0000 meq | EXTENDED_RELEASE_TABLET | Freq: Once | ORAL | Status: AC
  Administered 2018-08-21: 40 meq via ORAL
  Filled 2018-08-21: qty 2

## 2018-08-21 MED ORDER — ACETAMINOPHEN 500 MG PO TABS
500.0000 mg | ORAL_TABLET | Freq: Four times a day (QID) | ORAL | Status: DC | PRN
  Administered 2018-08-22: 500 mg via ORAL
  Filled 2018-08-21 (×2): qty 1

## 2018-08-21 NOTE — Consult Note (Signed)
[facility] Consult  Reason for Consult: 5th right toe infection?  Referring Physician:  Dr. Manuella Ghazi  Chief Complaint    Altered Mental Status      Robin Blackburn is a 73 y.o. female.  HPI: Robin Blackburn is a 73 yo admitted to the hospital with AMS and abdominal pain. She was worked up in the ED for the abdominal pain with a CT and was found to be negative. She was recently hospitalized for a UTI and refused SNF placement. She had a UA that came back negative in the ED. On evaluation she was noted to have eschar on her 5th digit on the right and I was asked to see the patient. She complains of bilateral leg and feet pain and says the left is worse than the right. She has been bed-bound since discharge home. She reports no injury to her toes or feet that she is aware of and says that she left the hospital with a blister. She says the blister on the top of her foot popped in the last few days.  She denies any prior infections or blisters on her feet or toes.     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   Depression    Fibromyalgia    Hypertension    Neuropathy    Rheumatoid arthritis ([place])    Stroke ([place]) 02/17/14    Past Surgical History:  Procedure Laterality Date   ABDOMINAL HYSTERECTOMY     ANKLE RECONSTRUCTION     APPENDECTOMY     BACK SURGERY     CHOLECYSTECTOMY     KNEE SURGERY      Family History  Problem Relation Age of Onset   Hypertension Father    Transient ischemic attack Father    Hypertension Brother    CVA Maternal Grandfather    Leukemia Brother     Social History   Tobacco Use   Smoking status: Never Smoker   Smokeless tobacco: Never Used  Substance Use Topics   Alcohol use: No   Drug use: No    Medications:  I have reviewed the patient's current medications. Prior to Admission:  Medications Prior to Admission  Medication Sig  Dispense Refill Last Dose   acetaminophen (TYLENOL) 500 MG tablet Take 500 mg by mouth every 6 (six) hours as needed for mild pain or moderate pain.   08/19/2018 at Unknown time   ALPRAZolam (XANAX) 0.5 MG tablet Take 1 tablet (0.5 mg total) by mouth 3 (three) times daily as needed for anxiety. (Patient taking differently: Take 0.5 mg by mouth 2 (two) times daily. ) 10 tablet 0 08/20/2018 at Unknown time   busPIRone (BUSPAR) 15 MG tablet Take 15 mg by mouth 2 (two) times daily.    08/20/2018 at Unknown time   Cholecalciferol 5000 units TABS Take 1 tablet by mouth daily.    08/20/2018 at Unknown time   clopidogrel (PLAVIX) 75 MG tablet Take 75 mg by mouth at bedtime.   08/20/2018 at Unknown time   desloratadine (CLARINEX) 5 MG tablet Take 5 mg by mouth every morning.    08/20/2018 at Unknown time   dicyclomine (BENTYL) 20 MG tablet Take 20 mg by mouth every 6 (six) hours. Abdominal cramps   08/20/2018 at Unknown time   HYDROcodone-acetaminophen (NORCO/VICODIN) 5-325 MG tablet Take 1 tablet by mouth every 6 (six) hours as needed  for moderate pain.   08/20/2018 at Unknown time   hydroxychloroquine (PLAQUENIL) 200 MG tablet Take 200 mg by mouth daily.    08/20/2018 at Unknown time   lansoprazole (PREVACID) 30 MG capsule Take 30 mg by mouth 2 (two) times daily.   08/20/2018 at Unknown time   methocarbamol (ROBAXIN) 750 MG tablet Take 750 mg by mouth 4 (four) times daily.   Past Week at Unknown time   metoprolol tartrate (LOPRESSOR) 25 MG tablet Take 1 tablet (25 mg total) by mouth 2 (two) times daily. 60 tablet 2 08/20/2018 at 1000am   ondansetron (ZOFRAN) 4 MG tablet Take 4 mg by mouth every 6 (six) hours as needed for nausea or vomiting.    08/20/2018 at Unknown time   PARoxetine (PAXIL) 10 MG tablet Take 10 mg by mouth at bedtime.    08/19/2018 at Unknown time   QUEtiapine (SEROQUEL) 25 MG tablet Take 25 mg by mouth at bedtime.    08/19/2018 at Unknown time   RESTASIS 0.05 % ophthalmic emulsion Place  1 drop into both eyes 2 (two) times daily.   08/20/2018 at Unknown time   sodium chloride (OCEAN) 0.65 % SOLN nasal spray Place 1 spray into both nostrils as needed for congestion. (Patient taking differently: Place 1 spray into both nostrils daily as needed for congestion. )   >month   tiZANidine (ZANAFLEX) 4 MG tablet Take 4 mg by mouth 2 (two) times daily as needed for muscle spasms.    08/20/2018 at Unknown time   trolamine salicylate (ASPERCREME) 10 % cream Apply 1 application topically as needed for muscle pain.   08/20/2018 at Unknown time   Propylene Glycol 0.6 % SOLN Place 1 drop into both eyes daily. At noon.   Not Taking at Unknown time   triamcinolone cream (KENALOG) 0.5 % Apply topically 2 (two) times daily. Apply to affected area twice daily as directed (left ankle area) (Patient not taking: Reported on 08/20/2018) 30 g 0 Not Taking at Unknown time   Scheduled:  busPIRone  15 mg Oral BID   cholecalciferol  5,000 Units Oral Daily   clopidogrel  75 mg Oral QHS   cycloSPORINE  1 drop Both Eyes BID   dicyclomine  20 mg Oral Q6H   hydroxychloroquine  200 mg Oral Daily   metoprolol tartrate  25 mg Oral BID   PARoxetine  10 mg Oral QHS   potassium chloride  40 mEq Oral Once   QUEtiapine  25 mg Oral QHS   sodium chloride flush  3 mL Intravenous Q12H   Continuous:  sodium chloride     ZOX:WRUEAV chloride, acetaminophen, ALPRAZolam, alum & mag hydroxide-simeth, HYDROcodone-acetaminophen, methocarbamol, ondansetron **OR** ondansetron (ZOFRAN) IV, sodium chloride flush  Allergies Allergies  Allergen Reactions   Demerol [Meperidine] Anaphylaxis   Hydromorphone Anaphylaxis   Keflex [Cephalexin] Nausea And Vomiting   Penicillins Anaphylaxis    Has patient had a PCN reaction causing immediate rash, facial/tongue/throat swelling, SOB or lightheadedness with hypotension: Yes Has patient had a PCN reaction causing severe rash involving mucus membranes or skin necrosis:  No Has patient had a PCN reaction that required hospitalization No Has patient had a PCN reaction occurring within the last 10 years: No If all of the above answers are "NO", then may proceed with Cephalosporin use.    Sulfa Antibiotics Nausea And Vomiting   Bisphosphonates     GI intolerance   Codeine    Fentanyl Other (See Comments)    "felt like  I had demons in my head"   Iohexol    Percocet [Oxycodone-Acetaminophen] Swelling    Mouth swelling.   Shellfish Allergy     Glucosamine not an option   Terramycin [Oxytetracycline] Nausea And Vomiting   Ciprofloxacin Nausea Only and Rash   Levaquin [Levofloxacin In D5w] Nausea And Vomiting and Rash   Morphine And Related Rash     ROS:  A comprehensive review of systems was negative except for: Constitutional: positive for AMS on admission Gastrointestinal: positive for abdominal pain Musculoskeletal: positive for bilateral leg and feet pain Left > right  Blood pressure (!) 156/76, pulse 72, temperature 98.3 F (36.8 C), temperature source Oral, resp. rate 19, height 5\' 6"  (1.676 m), weight 85.5 kg, SpO2 97 %. Physical Exam Vitals signs reviewed.  Constitutional:      Appearance: Normal appearance.  HENT:     Head: Normocephalic.     Nose: Nose normal.     Mouth/Throat:     Mouth: Mucous membranes are moist.  Eyes:     Pupils: Pupils are equal, round, and reactive to light.  Neck:     Musculoskeletal: Normal range of motion.  Cardiovascular:     Rate and Rhythm: Normal rate.     Pulses:          Dorsalis pedis pulses are 2+ on the right side and 2+ on the left side.  Pulmonary:     Effort: Pulmonary effort is normal.  Abdominal:     General: There is no distension.     Palpations: Abdomen is soft.     Tenderness: There is abdominal tenderness in the suprapubic area.  Musculoskeletal:        General: No swelling.     Comments: Almost clubbing like appearance to the bilateral feet, no ulcerations or injury on  the left, right 5th digit with dry black eschar, no signs of necrosis or drainage, no infection or cellulitis, dorsum of the right foot with a 3cm superficial blister that has sloughing epidermis, no extending cellulitis, superficial abrasion at the anterior ankle at the level of SCDs on the right; toes cool bilaterally  Skin:    General: Skin is dry.  Neurological:     General: No focal deficit present.     Mental Status: She is alert.     Comments: Oriented to self, location, situation, month and president, not oriented to year or day  Psychiatric:        Mood and Affect: Mood normal.        Behavior: Behavior normal.        Thought Content: Thought content normal.         Results: Results for orders placed or performed during the hospital encounter of 08/20/18 (from the past 48 hour(s))  Urinalysis, Routine w reflex microscopic     Status: Abnormal   Collection Time: 08/20/18 12:25 PM  Result Value Ref Range   Color, Urine STRAW (A) YELLOW   APPearance CLEAR CLEAR   Specific Gravity, Urine 1.004 (L) 1.005 - 1.030   pH 6.0 5.0 - 8.0   Glucose, UA NEGATIVE NEGATIVE mg/dL   Hgb urine dipstick MODERATE (A) NEGATIVE   Bilirubin Urine NEGATIVE NEGATIVE   Ketones, ur NEGATIVE NEGATIVE mg/dL   Protein, ur NEGATIVE NEGATIVE mg/dL   Nitrite NEGATIVE NEGATIVE   Leukocytes,Ua NEGATIVE NEGATIVE   RBC / HPF 0-5 0 - 5 RBC/hpf   WBC, UA 0-5 0 - 5 WBC/hpf   Bacteria, UA NONE SEEN NONE  SEEN   Mucus PRESENT     Comment: Performed at [facility], [address], [place], [place] [address]  CBC with Differential/Platelet     Status: Abnormal   Collection Time: 08/20/18  2:00 PM  Result Value Ref Range   WBC 14.0 (H) 4.0 - 10.5 K/uL   RBC 4.55 3.87 - 5.11 MIL/uL   Hemoglobin 13.3 12.0 - 15.0 g/dL   HCT 42.6 36.0 - 46.0 %   MCV 93.6 80.0 - 100.0 fL   MCH 29.2 26.0 - 34.0 pg   MCHC 31.2 30.0 - 36.0 g/dL   RDW 13.6 11.5 - 15.5 %   Platelets 477 (H) 150 - 400 K/uL   nRBC 0.0 0.0 - 0.2 %     Neutrophils Relative % 70 %   Neutro Abs 9.7 (H) 1.7 - 7.7 K/uL   Lymphocytes Relative 20 %   Lymphs Abs 2.8 0.7 - 4.0 K/uL   Monocytes Relative 8 %   Monocytes Absolute 1.1 (H) 0.1 - 1.0 K/uL   Eosinophils Relative 1 %   Eosinophils Absolute 0.2 0.0 - 0.5 K/uL   Basophils Relative 1 %   Basophils Absolute 0.1 0.0 - 0.1 K/uL   Immature Granulocytes 0 %   Abs Immature Granulocytes 0.05 0.00 - 0.07 K/uL    Comment: Performed at [facility], [address], [place], [place] [address]  Comprehensive metabolic panel     Status: Abnormal   Collection Time: 08/20/18  2:00 PM  Result Value Ref Range   Sodium 137 135 - 145 mmol/L   Potassium 4.3 3.5 - 5.1 mmol/L   Chloride 101 98 - 111 mmol/L   CO2 25 22 - 32 mmol/L   Glucose, Bld 112 (H) 70 - 99 mg/dL   BUN 9 8 - 23 mg/dL   Creatinine, Ser 0.82 0.44 - 1.00 mg/dL   Calcium 8.8 (L) 8.9 - 10.3 mg/dL   Total Protein 7.6 6.5 - 8.1 g/dL   Albumin 3.8 3.5 - 5.0 g/dL   AST 29 15 - 41 U/L   ALT 18 0 - 44 U/L   Alkaline Phosphatase 128 (H) 38 - 126 U/L   Total Bilirubin 0.6 0.3 - 1.2 mg/dL   GFR calc non Af Amer >60 >60 mL/min   GFR calc Af Amer >60 >60 mL/min   Anion gap 11 5 - 15    Comment: Performed at [facility], [address] [place], [place] [address]  Lactic acid, plasma     Status: None   Collection Time: 08/20/18  2:00 PM  Result Value Ref Range   Lactic Acid, Venous 1.7 0.5 - 1.9 mmol/L    Comment: Performed at [facility], [address], [place], [place] [address]  Basic metabolic panel     Status: Abnormal   Collection Time: 08/21/18  6:21 AM  Result Value Ref Range   Sodium 138 135 - 145 mmol/L   Potassium 3.4 (L) 3.5 - 5.1 mmol/L    Comment: DELTA CHECK NOTED   Chloride 102 98 - 111 mmol/L   CO2 24 22 - 32 mmol/L   Glucose, Bld 107 (H) 70 - 99 mg/dL   BUN 7 (L) 8 - 23 mg/dL   Creatinine, Ser 0.73 0.44 - 1.00 mg/dL   Calcium 8.8 (L) 8.9 - 10.3 mg/dL   GFR calc non Af Amer >60 >60 mL/min   GFR calc Af Amer >60  >60 mL/min   Anion gap 12 5 - 15  Comment: Performed at [facility], [address], [place], [place] [address]  CBC     Status: Abnormal   Collection Time: 08/21/18  6:21 AM  Result Value Ref Range   WBC 11.1 (H) 4.0 - 10.5 K/uL   RBC 4.75 3.87 - 5.11 MIL/uL   Hemoglobin 13.7 12.0 - 15.0 g/dL   HCT 44.6 36.0 - 46.0 %   MCV 93.9 80.0 - 100.0 fL   MCH 28.8 26.0 - 34.0 pg   MCHC 30.7 30.0 - 36.0 g/dL   RDW 13.6 11.5 - 15.5 %   Platelets 463 (H) 150 - 400 K/uL   nRBC 0.0 0.0 - 0.2 %    Comment: Performed at [facility], [address] [place], [place] [address]    Ct Abdomen Pelvis Wo Contrast  Result Date: 08/20/2018 CLINICAL DATA:  73 year old female with abdominal pain and distension. EXAM: CT ABDOMEN AND PELVIS WITHOUT CONTRAST TECHNIQUE: Multidetector CT imaging of the abdomen and pelvis was performed following the standard protocol without IV contrast. COMPARISON:  12/17/2015 CT FINDINGS: Please note that parenchymal abnormalities may be missed without intravenous contrast. Lower chest: No acute abnormality. Hepatobiliary: Hepatic steatosis noted without suspicious focal hepatic abnormality. The patient is status post cholecystectomy. No biliary dilatation. Pancreas: Atrophic without other significant abnormality Spleen: Unremarkable Adrenals/Urinary Tract: The kidneys and adrenal glands are unremarkable except for small nonobstructing RIGHT renal calculi. No hydronephrosis. Increased density within the dependent bladder may represent small bladder calculi. Stomach/Bowel: Stomach is within normal limits. No evidence of bowel wall thickening, distention, or inflammatory changes. Vascular/Lymphatic: Aortic atherosclerosis. No enlarged abdominal or pelvic lymph nodes. Reproductive: Status post hysterectomy. No adnexal masses. Other: No ascites, pneumoperitoneum or focal collection. Musculoskeletal: No acute or significant osseous findings. Mild multilevel degenerative changes within the  lumbar spine noted. IMPRESSION: 1. No acute abnormality 2. Hepatic steatosis 3. Nonobstructing RIGHT renal calculi 4.  Aortic Atherosclerosis (ICD10-I70.0). Electronically Signed   By: Margarette Canada M.D.   On: 08/20/2018 16:39   Ct Head Wo Contrast  Result Date: 08/20/2018 CLINICAL DATA:  73 year old female with altered mental status, recent UTI EXAM: CT HEAD WITHOUT CONTRAST TECHNIQUE: Contiguous axial images were obtained from the base of the skull through the vertex without intravenous contrast. COMPARISON:  Prior head CT 07/30/2018 FINDINGS: Brain: No evidence of acute infarction, hemorrhage, hydrocephalus, extra-axial collection or mass lesion/mass effect. Similar pattern of advanced central and cortical atrophy with ex vacuo dilatation of the lateral ventricles. Confluent periventricular white matter hypoattenuation remains nonspecific but most consistent with advanced chronic microvascular ischemic white matter change. Small lacunar infarcts in the left thalamus. Vascular: No hyperdense vessel or unexpected calcification. Skull: Normal. Negative for fracture or focal lesion. Sinuses/Orbits: Chronic right maxillary sinusitis. Other: None. IMPRESSION: 1. No acute intracranial abnormality. 2. 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 Electronically Signed   By: Jacqulynn Cadet M.D.   On: 08/20/2018 16:38   Dg Chest Portable 1 View  Result Date: 08/20/2018 CLINICAL DATA:  Altered mental status. Shortness of breath. Hypertension. EXAM: PORTABLE CHEST 1 VIEW COMPARISON:  07/30/2018. FINDINGS: Cardiomegaly. No pulmonary venous congestion. No focal infiltrate. No pleural effusion or pneumothorax. IMPRESSION: 1.  Cardiomegaly.  No pulmonary venous congestion. 2.  No acute pulmonary disease. Electronically Signed   By: Marcello Moores  Register   On: 08/20/2018 13:14     Assessment & Plan:  Robin Blackburn is a 73 y.o. female with a dry black eschar on  the 5th  digit on the right and superficial blister on the dorsum of the right foot. No signs of infection as this toe is dry and has no drainage and no signs of cellulitis surround the toe or the blister area.  -No surgical debridement or intervention indicated.   -Keep the toe dry, the eschar has already demarcated and continue to demarcate -Blister on the dorsum of the foot and abrasion at the ankle covered with allevyn pads to prevent further pressure related injuries; place the dressing every day to 2 days  -ABI should be obtained to see if any decreased blood flow to the area, unsure of the etiology of the dry eschar on the 5th digit, but her toes are cool, if ABIs abnormal will need outpatient Vascular assessment to determine need for any revascularization    All questions were answered to the satisfaction of the patient.   Discussed with Dr. Manuella Ghazi.    Virl Cagey 08/21/2018, 10:03 AM

## 2018-08-21 NOTE — Progress Notes (Signed)
PROGRESS NOTE    Robin Blackburn  DTO:[id number] DOB: [birth year] DOA: 08/20/2018 PCP: Redmond School, MD   Brief Narrative:  Per HPI: Robin Blackburn is a 73 y.o. female with medical history significant of functional quadriplegic, history of CVA, fibromyalgia, rheumatoid arthritis, hypertension lives at home is bedbound has a caretaker that comes in 2 to 3 hours a day.  Patient was recently hospitalized less than a month ago and was offered nursing home which she refused.  Since that time family has been unable to take care of her at home.  She has been hallucinating more also that is been going on for over a week per ED report.  She is been seeing rats and there is none.  She is denying any hallucinations at this time.  There is been no fever.  She denies any nausea vomiting or diarrhea.  She was recently treated for UTI.  Patient is being referred for admission for hallucinations and the need for placement.  Patient was admitted with acute encephalopathy with persistent hallucinations and need for placement.  She is noted to have an eschar to her right fifth toe.  Assessment & Plan:   Principal Problem:   Acute metabolic encephalopathy Active Problems:   Essential hypertension   Anxiety   Rheumatoid arthritis (HCC)   H/O: CVA (cerebrovascular accident)   Chronic pain syndrome   Fibromyalgia   Benzodiazepine withdrawal with perceptual disturbance (HCC)   Bedbound   Acute encephalopathy   1. Persistent hallucinations with questionable acute on chronic encephalopathy likely secondary to polypharmacy.  Patient was recently hospitalized for UTI which was thought to be the cause and she refused skilled nursing facility placement at that time.  She unfortunately continues to have persistence of the symptoms which I believe may be related to her multiple narcotic, anxiolytic, and antipsychotic medications.  She will more than likely require psychiatry evaluation in the outpatient setting, but is  not married any inpatient evaluation.  She appears to be stable and lucid this morning. 2. Necrotic changes noted to right fifth toe.  Appreciate general surgery evaluation for potential debridement.  Appears to have good pulses to both feet. 3. Essential hypertension-controlled.  Continue home medications.  Monitor for elevations. 4. History of rheumatoid arthritis-stable. 5. History of CVA.  Continue aspirin. 6. Chronic pain syndrome.  Continue narcotic medications and change muscle relaxers to as needed. 7. Bedbound status with need for placement.  PT evaluation appreciated.   DVT prophylaxis: SCDs Code Status: Full Family Communication: None at bedside Disposition Plan: Plan to admit for skilled nursing facility placement.  Appreciate case management evaluation.  Appreciate general surgery evaluation of eschar to right fifth toe.   Consultants:   General surgery  Procedures:   None  Antimicrobials:   None   Subjective: Patient seen and evaluated today with no new acute complaints or concerns. No acute concerns or events noted overnight.  She appears to be lucid with no hallucinations at the moment.  Objective: Vitals:   08/20/18 2030 08/20/18 2107 08/21/18 0542 08/21/18 0739  BP: (!) 170/99 (!) 151/102 (!) 160/80 (!) 156/76  Pulse: 77 82 78 72  Resp:  18 19   Temp:  99.1 F (37.3 C) 98.1 F (36.7 C) 98.3 F (36.8 C)  TempSrc:  Oral Oral Oral  SpO2: 97% 97% 97% 97%  Weight:  85.5 kg    Height:  5\' 6"  (1.676 m)      Intake/Output Summary (Last 24 hours) at 08/21/2018 1043  Last data filed at 08/21/2018 0500 Gross per 24 hour  Intake 740 ml  Output --  Net 740 ml   Filed Weights   08/20/18 1140 08/20/18 2107  Weight: 90.4 kg 85.5 kg    Examination:  General exam: Appears calm and comfortable  Respiratory system: Clear to auscultation. Respiratory effort normal. Cardiovascular system: S1 & S2 heard, RRR. No JVD, murmurs, rubs, gallops or clicks. No pedal  edema. Gastrointestinal system: Abdomen is nondistended, soft and nontender. No organomegaly or masses felt. Normal bowel sounds heard. Central nervous system: Alert and oriented. No focal neurological deficits. Extremities: Symmetric 5 x 5 power. Skin: No rashes, lesions or ulcers, eschar of approximately 1 cm diameter noted to base of right fifth toe.  2.5 cm diameter circular lesion on dorsum of right foot noted as well. Psychiatry: Difficult to assess.    Data Reviewed: I have personally reviewed following labs and imaging studies  CBC: Recent Labs  Lab 08/20/18 1400 08/21/18 0621  WBC 14.0* 11.1*  NEUTROABS 9.7*  --   HGB 13.3 13.7  HCT 42.6 44.6  MCV 93.6 93.9  PLT 477* 798*   Basic Metabolic Panel: Recent Labs  Lab 08/20/18 1400 08/21/18 0621  NA 137 138  K 4.3 3.4*  CL 101 102  CO2 25 24  GLUCOSE 112* 107*  BUN 9 7*  CREATININE 0.82 0.73  CALCIUM 8.8* 8.8*   GFR: Estimated Creatinine Clearance: 69 mL/min (by C-G formula based on SCr of 0.73 mg/dL). Liver Function Tests: Recent Labs  Lab 08/20/18 1400  AST 29  ALT 18  ALKPHOS 128*  BILITOT 0.6  PROT 7.6  ALBUMIN 3.8   No results for input(s): LIPASE, AMYLASE in the last 168 hours. No results for input(s): AMMONIA in the last 168 hours. Coagulation Profile: No results for input(s): INR, PROTIME in the last 168 hours. Cardiac Enzymes: No results for input(s): CKTOTAL, CKMB, CKMBINDEX, TROPONINI in the last 168 hours. BNP (last 3 results) No results for input(s): PROBNP in the last 8760 hours. HbA1C: No results for input(s): HGBA1C in the last 72 hours. CBG: No results for input(s): GLUCAP in the last 168 hours. Lipid Profile: No results for input(s): CHOL, HDL, LDLCALC, TRIG, CHOLHDL, LDLDIRECT in the last 72 hours. Thyroid Function Tests: No results for input(s): TSH, T4TOTAL, FREET4, T3FREE, THYROIDAB in the last 72 hours. Anemia Panel: No results for input(s): VITAMINB12, FOLATE, FERRITIN,  TIBC, IRON, RETICCTPCT in the last 72 hours. Sepsis Labs: Recent Labs  Lab 08/20/18 1400  LATICACIDVEN 1.7    No results found for this or any previous visit (from the past 240 hour(s)).       Radiology Studies: Ct Abdomen Pelvis Wo Contrast  Result Date: 08/20/2018 CLINICAL DATA:  74 year old female with abdominal pain and distension. EXAM: CT ABDOMEN AND PELVIS WITHOUT CONTRAST TECHNIQUE: Multidetector CT imaging of the abdomen and pelvis was performed following the standard protocol without IV contrast. COMPARISON:  12/17/2015 CT FINDINGS: Please note that parenchymal abnormalities may be missed without intravenous contrast. Lower chest: No acute abnormality. Hepatobiliary: Hepatic steatosis noted without suspicious focal hepatic abnormality. The patient is status post cholecystectomy. No biliary dilatation. Pancreas: Atrophic without other significant abnormality Spleen: Unremarkable Adrenals/Urinary Tract: The kidneys and adrenal glands are unremarkable except for small nonobstructing RIGHT renal calculi. No hydronephrosis. Increased density within the dependent bladder may represent small bladder calculi. Stomach/Bowel: Stomach is within normal limits. No evidence of bowel wall thickening, distention, or inflammatory changes. Vascular/Lymphatic: Aortic atherosclerosis. No enlarged  abdominal or pelvic lymph nodes. Reproductive: Status post hysterectomy. No adnexal masses. Other: No ascites, pneumoperitoneum or focal collection. Musculoskeletal: No acute or significant osseous findings. Mild multilevel degenerative changes within the lumbar spine noted. IMPRESSION: 1. No acute abnormality 2. Hepatic steatosis 3. Nonobstructing RIGHT renal calculi 4.  Aortic Atherosclerosis (ICD10-I70.0). Electronically Signed   By: Margarette Canada M.D.   On: 08/20/2018 16:39   Ct Head Wo Contrast  Result Date: 08/20/2018 CLINICAL DATA:  73 year old female with altered mental status, recent UTI EXAM: CT HEAD  WITHOUT CONTRAST TECHNIQUE: Contiguous axial images were obtained from the base of the skull through the vertex without intravenous contrast. COMPARISON:  Prior head CT 07/30/2018 FINDINGS: Brain: No evidence of acute infarction, hemorrhage, hydrocephalus, extra-axial collection or mass lesion/mass effect. Similar pattern of advanced central and cortical atrophy with ex vacuo dilatation of the lateral ventricles. Confluent periventricular white matter hypoattenuation remains nonspecific but most consistent with advanced chronic microvascular ischemic white matter change. Small lacunar infarcts in the left thalamus. Vascular: No hyperdense vessel or unexpected calcification. Skull: Normal. Negative for fracture or focal lesion. Sinuses/Orbits: Chronic right maxillary sinusitis. Other: None. IMPRESSION: 1. No acute intracranial abnormality. 2. 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 Electronically Signed   By: Jacqulynn Cadet M.D.   On: 08/20/2018 16:38   Dg Chest Portable 1 View  Result Date: 08/20/2018 CLINICAL DATA:  Altered mental status. Shortness of breath. Hypertension. EXAM: PORTABLE CHEST 1 VIEW COMPARISON:  07/30/2018. FINDINGS: Cardiomegaly. No pulmonary venous congestion. No focal infiltrate. No pleural effusion or pneumothorax. IMPRESSION: 1.  Cardiomegaly.  No pulmonary venous congestion. 2.  No acute pulmonary disease. Electronically Signed   By: Marcello Moores  Register   On: 08/20/2018 13:14        Scheduled Meds:  busPIRone  15 mg Oral BID   cholecalciferol  5,000 Units Oral Daily   clopidogrel  75 mg Oral QHS   cycloSPORINE  1 drop Both Eyes BID   dicyclomine  20 mg Oral Q6H   hydroxychloroquine  200 mg Oral Daily   metoprolol tartrate  25 mg Oral BID   PARoxetine  10 mg Oral QHS   potassium chloride  40 mEq Oral Once   QUEtiapine  25 mg Oral QHS   sodium chloride flush  3 mL Intravenous Q12H    Continuous Infusions:  sodium chloride       LOS: 0 days    Time spent: 30 minutes    Johnelle Tafolla Darleen Crocker, DO [facility] Pager [phone]  If 7PM-7AM, please contact night-coverage [email or web address] Password [facility] 08/21/2018, 10:43 AM

## 2018-08-21 NOTE — Evaluation (Signed)
Physical Therapy Evaluation Patient Details Name: Robin Blackburn MRN: [id number] DOB: August 05, 1945 Today's Date: 08/21/2018   History of Present Illness  Robin Blackburn is a 73 y.o. female with medical history significant of functional quadriplegic, history of CVA, fibromyalgia, rheumatoid arthritis, hypertension lives at home is bedbound has a caretaker that comes in 2 to 3 hours a day.  Patient was recently hospitalized less than a month ago and was offered nursing home which she refused.  Since that time family has been unable to take care of her at home.  She has been hallucinating more also that is been going on for over a week per ED report.  She is been seeing rats and there is none.  She is denying any hallucinations at this time.  There is been no fever.  She denies any nausea vomiting or diarrhea.  She was recently treated for UTI.  Patient is being referred for admission for hallucinations and the need for placement.     Clinical Impression  Robin Blackburn is a 73 y.o. presenting for PT evaluation following recent hospital admission for confusion and hallucinations. Patient provided subjective history of PLOF and chart review utilized to confirm as she may be an unreliable historian, particularly with timeline of events. She is primarily bed bound at baseline and requires assistance for bed mobility transfers and all ADL's/IADL's. At home she has had a caregiver provider 2-3 hours of care a day to assist with sponge baths, meal preparation, and toileting. Patient has reported a fear of falling and states she does not get out of bed due to this. She currently requires moderate assist with multimodal cues to sequence supine<>sit transfers and was aunable to attempt standing or ambulation today due to dizziness, nausea, and lighteheadedness after sitting. She was able to assist with scooting up the bed superiorly to reposition at EOS and was left in chair position with bed alram on, SCD's inplace, bed  rails up, and fall mat laid out on floor. MD was t her bedside at EOS. Robin Blackburn will benefit from skilled PT to address current impairments and from follow up PT at below venue to address mobility and provide daily assisted care.  Acute PT will follow.     Follow Up Recommendations LTACH;SNF    Equipment Recommendations  None recommended by PT    Recommendations for Other Services       Precautions / Restrictions Precautions Precautions: Fall Restrictions Weight Bearing Restrictions: No      Mobility  Bed Mobility Overal bed mobility: Needs Assistance Bed Mobility: Rolling;Supine to Sit;Sit to Supine Rolling: Mod assist   Supine to sit: Mod assist Sit to supine: Mod assist   General bed mobility comments: Pt requires verbal/visual cues for sequencing and moderate assist to attain upright seated position and maintain balance with UE support, cues required for positioning bil UE's for support.  Transfers Overall transfer level: Needs assistance      General transfer comment: deferred sit to stand transfer this date as patient reported increased dizziness, nausea, and lightheaded sensation after 1 minute sitting that did not resolve with ankle pumps/LE marching  Ambulation/Gait    General Gait Details: deferred ambulation this date as patient reported increased dizziness, nausea, and lightheaded sensation after 1 minute sitting that did not resolve with ankle pumps/LE marching. Patient is primarily bedbound at baseline.     Balance Overall balance assessment: Needs assistance Sitting-balance support: Bilateral upper extremity supported;Feet unsupported Sitting balance-Leahy Scale: Poor Sitting balance -  Comments: verbal/tactile cues required for positioning UE's to proivde support in sitting at EOB Postural control: Posterior lean;Left lateral lean   Standing balance-Leahy Scale: Zero Standing balance comment: unsafe to try at this time/date due to weakness, dizziness,  nausea, lightheadedness          Pertinent Vitals/Pain Pain Assessment: 0-10 Pain Score: 7  Pain Location: Rt/Lt toes Pain Intervention(s): Limited activity within patient's tolerance;Monitored during session    [place] expects to be discharged to:: Skilled nursing facility Living Arrangements: Alone;Other (Comment)        Additional Comments: pt reprots she lives alone and has an aid who comes in for 2-3 hours each day.    Prior Function Level of Independence: Needs assistance   Gait / Transfers Assistance Needed: Patient reports she has not been ambulatory since last May and that was short distances in her home. Chart review indicates that patietn has been primaril sedentary and bedbound for ~2+ years. She reports she in in bed for most of her day and requres assistance to get in and out of bed from her aid.   ADL's / Homemaking Assistance Needed: pt reprts aide comes 2-3 hours per day, bathe her in bed, assist with toileting using bedpan or diapers, and provide meals for her.        Hand Dominance   Dominant Hand: Right    Extremity/Trunk Assessment   Upper Extremity Assessment Upper Extremity Assessment: Generalized weakness    Lower Extremity Assessment Lower Extremity Assessment: Generalized weakness;RLE deficits/detail;LLE deficits/detail RLE Deficits / Details: Bil LE's 2+/5 for hip flexion    Cervical / Trunk Assessment Cervical / Trunk Assessment: Kyphotic  Communication   Communication: No difficulties  Cognition Arousal/Alertness: Awake/alert Behavior During Therapy: WFL for tasks assessed/performed Overall Cognitive Status: Within Functional Limits for tasks assessed        General Comments: pt alert and oriented to person, place and situation but not time.       General Comments General comments (skin integrity, edema, etc.): Patient has circular wound on dorsum of Rt foot proximal to the fifth digit and black escahr around Rt  fifth digit. She has a red blister in the middle of the dorsum of her Rt foot.    Exercises General Exercises - Lower Extremity Ankle Circles/Pumps: AROM;Both;10 reps;Seated;Other (comment)(2x 10 reps) Hip Flexion/Marching: AROM;Both;10 reps;Seated;Other (comment)(2x 10 reps)   Assessment/Plan    PT Assessment Patient needs continued PT services  PT Problem List Decreased strength;Decreased activity tolerance;Decreased range of motion;Decreased balance;Decreased mobility;Decreased skin integrity       PT Treatment Interventions Gait training;Stair training;Functional mobility training;Therapeutic activities;Therapeutic exercise;Balance training;Neuromuscular re-education;Patient/family education;Manual techniques    PT Goals (Current goals can be found in the Care Plan section)  Acute Rehab PT Goals Patient Stated Goal: to get out of the hospital to go somewhere she can get stronger PT Goal Formulation: With patient Time For Goal Achievement: 08/28/18 Potential to Achieve Goals: Fair    Frequency Min 2X/week   Barriers to discharge Inaccessible home environment;Decreased caregiver support Pt will require 24 hour assitance/care a she is unable to transfer or mobilize independently and requires assistance for all ADL's/IADL's. Her current caregiver provides 2-3 hours of care a day leaving the patient bedbound without assistance the remainder of the day.        AM-PAC PT "6 Clicks" Mobility  Outcome Measure Help needed turning from your back to your side while in a flat bed without using bedrails?: A Lot Help needed  moving from lying on your back to sitting on the side of a flat bed without using bedrails?: A Lot Help needed moving to and from a bed to a chair (including a wheelchair)?: Total Help needed standing up from a chair using your arms (e.g., wheelchair or bedside chair)?: Total Help needed to walk in hospital room?: Total Help needed climbing 3-5 steps with a railing? :  Total 6 Click Score: 8    End of Session   Activity Tolerance: Patient limited by fatigue Patient left: in bed;with call bell/phone within reach;with bed alarm set;with SCD's reapplied;Other (comment)(with MD at bedside) Nurse Communication: Mobility status PT Visit Diagnosis: Unsteadiness on feet (R26.81);Other abnormalities of gait and mobility (R26.89);Muscle weakness (generalized) (M62.81);Difficulty in walking, not elsewhere classified (R26.2)    Time: 1040-1109 PT Time Calculation (min) (ACUTE ONLY): 29 min   Charges:   PT Evaluation $PT Eval Moderate Complexity: 1 Mod        Kipp Brood, PT, DPT, [facility] Physical Therapist with [facility]  08/21/2018 11:31 AM

## 2018-08-21 NOTE — Progress Notes (Signed)
On admission assessment: R Foot-5th digit black/escar.

## 2018-08-21 NOTE — Progress Notes (Signed)
Initial Nutrition Assessment  DOCUMENTATION CODES:  Obesity unspecified  INTERVENTION:  Ensure Max protein supplement BID, each supplement provides 150kcal and 30g of protein.  NUTRITION DIAGNOSIS:  Increased nutrient needs related to wound healing as evidenced by estimated nutrition requirements for this outcome  GOAL:  Patient will meet greater than or equal to 90% of their needs  MONITOR:  PO intake, Supplement acceptance, Skin, Weight trends, I & O's  REASON FOR ASSESSMENT:  Malnutrition Screening Tool    ASSESSMENT:  73 y/o female w/ PMHx CVA, RA, HTN, fibromyalgia, recurrent UTIs, depression, anxiety. Reportedly bed bound d/t fear of falling. Brought from home d/t increased hallucinations and inability for family to adequately care for. Admitted for AMS and to facilitate snf placement.   RD operating remotely on weekends. Pt is known to RD. She had been resident at [facility]) for several years 02/2014-12/2015. Though she was wheelchair bound d/t fear of falling, she thrived at facility. She ate exceptionally well and gained weight. She was discharged at a weight of 206.   From chart, it appears patient has declined considerably since discharge. Reportedly is in bed all 22 hrs a day, only being up for 2 hrs when caregiver is present.   Though her MST screen was positive for unintentional weight loss and decreased intake, there is no reviewing this. EDP ROS- pt was negative for appetite change.  During a recent admission 2/21-2/23, she ate 75% of most meals.   Her current weight is 188.5 lbs, 15-20 lbs less than her SNF D/C wt 2.5 years ago. While loss could potentially  be related to decreased intake, it also could be related to muscle atrophy, given she has become largely bedbound since she was discharged.   Revealing the meal records for current admission, pt ate 25% of only meal documented on. Given necrotic changes to R foot, wiill add high protein oral  supplements.   Labs: K: 3.4, WBC: 11.1,  Meds: D3, Bentyl, KCL, Seroquel  Recent Labs  Lab 08/20/18 1400 08/21/18 0621  NA 137 138  K 4.3 3.4*  CL 101 102  CO2 25 24  BUN 9 7*  CREATININE 0.82 0.73  CALCIUM 8.8* 8.8*  GLUCOSE 112* 107*   NUTRITION - FOCUSED PHYSICAL EXAM: Unable to conduct  Diet Order:   Diet Order            Diet Heart Room service appropriate? Yes; Fluid consistency: Thin  Diet effective now             EDUCATION NEEDS:  Not appropriate for education at this time  Skin: Necrotic changes to R foot/toe  Last BM:  3/11  Height:  Ht Readings from Last 1 Encounters:  08/20/18 5\' 6"  (1.676 m)   Weight:  Wt Readings from Last 1 Encounters:  08/20/18 85.5 kg   Wt Readings from Last 10 Encounters:  08/20/18 85.5 kg  07/30/18 90.4 kg  12/03/16 89.8 kg  11/25/16 90.1 kg  04/02/16 92.9 kg  12/25/15 93.5 kg  12/18/15 95.7 kg  12/17/15 82.6 kg  11/21/15 96 kg  11/14/15 96 kg   Ideal Body Weight:  59.1 kg  BMI:  Body mass index is 30.42 kg/m.  Estimated Nutritional Needs:  Kcal:  1600-1800 (19-21 kcal/kg bw) Protein:  77-89g (1.3-1.5g/kg ibw) Fluid:  1.6-1.8 L fluid (1 ml/kcal)   Burtis Junes RD, LDN, CNSC Clinical Nutrition Available Tues-Sat via Pager: [phone] 08/21/2018 11:33 AM

## 2018-08-21 NOTE — Plan of Care (Signed)
  Problem: Acute Rehab PT Goals(only PT should resolve) Goal: Pt Will Go Supine/Side To Sit Outcome: Progressing Flowsheets (Taken 08/21/2018 1140) Pt will go Supine/Side to Sit: with minimal assist Goal: Pt Will Go Sit To Supine/Side Outcome: Progressing Flowsheets (Taken 08/21/2018 1140) Pt will go Sit to Supine/Side: with minimal assist Goal: Patient Will Transfer Sit To/From Stand Outcome: Progressing Flowsheets (Taken 08/21/2018 1140) Patient will transfer sit to/from stand: with moderate assist; from elevated surface Note:  With RW Goal: Pt Will Transfer Bed To Chair/Chair To Bed Outcome: Progressing Flowsheets (Taken 08/21/2018 1140) Pt will Transfer Bed to Chair/Chair to Bed: with mod assist Note:  With RW

## 2018-08-22 ENCOUNTER — Inpatient Hospital Stay (HOSPITAL_COMMUNITY): Payer: Medicare Other

## 2018-08-22 LAB — BASIC METABOLIC PANEL
Anion gap: 8 (ref 5–15)
BUN: 6 mg/dL — ABNORMAL LOW (ref 8–23)
CO2: 26 mmol/L (ref 22–32)
Calcium: 8.9 mg/dL (ref 8.9–10.3)
Chloride: 104 mmol/L (ref 98–111)
Creatinine, Ser: 0.7 mg/dL (ref 0.44–1.00)
GFR calc Af Amer: >60 mL/min (ref >60)
GFR calc non Af Amer: >60 mL/min (ref >60)
Glucose, Bld: 122 mg/dL — ABNORMAL HIGH (ref 70–99)
Potassium: 4.1 mmol/L (ref 3.5–5.1)
Sodium: 138 mmol/L (ref 135–145)

## 2018-08-22 LAB — MAGNESIUM: Magnesium: 2.3 mg/dL (ref 1.7–2.4)

## 2018-08-22 LAB — CBC
HCT: 42.2 % (ref 36.0–46.0)
HEMOGLOBIN: 13.3 g/dL (ref 12.0–15.0)
MCH: 30 pg (ref 26.0–34.0)
MCHC: 31.5 g/dL (ref 30.0–36.0)
MCV: 95 fL (ref 80.0–100.0)
Platelets: 400 10*3/uL (ref 150–400)
RBC: 4.44 MIL/uL (ref 3.87–5.11)
RDW: 13.8 % (ref 11.5–15.5)
WBC: 9.2 10*3/uL (ref 4.0–10.5)
nRBC: 0 % (ref 0.0–0.2)

## 2018-08-22 MED ORDER — HYDRALAZINE HCL 20 MG/ML IJ SOLN
10.0000 mg | INTRAMUSCULAR | Status: DC | PRN
  Administered 2018-08-22: 10 mg via INTRAVENOUS
  Filled 2018-08-22: qty 1

## 2018-08-22 MED ORDER — PANTOPRAZOLE SODIUM 40 MG PO TBEC: 40.0000 mg | DELAYED_RELEASE_TABLET | Freq: Two times a day (BID) | ORAL | Status: DC

## 2018-08-22 MED ORDER — PANTOPRAZOLE SODIUM 40 MG PO TBEC
40.0000 mg | DELAYED_RELEASE_TABLET | Freq: Two times a day (BID) | ORAL | Status: DC
  Administered 2018-08-22 – 2018-08-25 (×6): 40 mg via ORAL
  Filled 2018-08-22 (×6): qty 1

## 2018-08-22 MED ORDER — SIMETHICONE 80 MG PO CHEW: 80.0000 mg | CHEWABLE_TABLET | Freq: Four times a day (QID) | ORAL | Status: DC | PRN

## 2018-08-22 NOTE — Progress Notes (Signed)
PROGRESS NOTE    Robin Blackburn  XLK:[id number] DOB: [birth year] DOA: 08/20/2018 PCP: Redmond School, MD   Brief Narrative:  Per HPI: Robin Blackburn a 73 y.o.femalewith medical history significant offunctional quadriplegic, history of CVA, fibromyalgia, rheumatoid arthritis, hypertension lives at home is bedbound has a caretaker that comes in 2 to 3 hours a day. Patient was recently hospitalized less than a month ago and was offered nursing home which she refused. Since that time family has been unable to take care of her at home. She has been hallucinating more also that is been going on for over a week per ED report. She is been seeing rats and there is none. She is denying any hallucinations at this time. There is been no fever. She denies any nausea vomiting or diarrhea. She was recently treated for UTI. Patient is being referred for admission for hallucinations and the need for placement.  Patient was admitted with acute encephalopathy with persistent hallucinations and need for placement.  She is noted to have an eschar to her right fifth toe which has been evaluated by general surgery with no need for debridement currently noted.  She is essentially stable from a psychiatric standpoint and is awaiting placement.  She has been evaluated by physical therapy and confirm need for SNF.  Assessment & Plan:   Principal Problem:   Acute metabolic encephalopathy Active Problems:   Essential hypertension   Anxiety   Rheumatoid arthritis (HCC)   H/O: CVA (cerebrovascular accident)   Chronic pain syndrome   Fibromyalgia   Benzodiazepine withdrawal with perceptual disturbance (HCC)   Bedbound   Acute encephalopathy   Eschar of toe   Blister of foot without infection, right, initial encounter   1. Persistent hallucinations with questionable acute on chronic encephalopathy likely secondary to polypharmacy-improving.  Patient was recently hospitalized for UTI which was thought to  be the cause and she refused skilled nursing facility placement at that time.  She unfortunately continues to have persistence of the symptoms which I believe may be related to her multiple narcotic, anxiolytic, and antipsychotic medications.  She will more than likely require psychiatry evaluation in the outpatient setting, but is not married any inpatient evaluation.  She appears to be stable this morning, but drowsy. 2. Necrotic changes noted to right fifth toe.  Appreciate general surgery evaluation with wound care noted for now.  No need for intervention currently.  ABI ordered and pending. 3. Essential hypertension- elevated.  Continue home medications and add hydralazine for better control today. 4. History of rheumatoid arthritis.  Stable. 5. History of CVA.  Continue aspirin. 6. Chronic pain syndrome.  Continue narcotic medications and change muscle relaxers to as needed. 7. Bedbound status with need for placement.  PT evaluation appreciated with need for SNF/rehab.   DVT prophylaxis: SCDs Code Status: Full Family Communication: None at bedside Disposition Plan: Plan to admit for skilled nursing facility placement hopefully in the next 24 to 48 hours.  Appreciate case management evaluation.  Appreciate general surgery evaluation of eschar to right fifth toe with no need for intervention noted.  ABI ordered and pending.   Consultants:   General surgery  Procedures:   None  Antimicrobials:   None   Subjective: Patient seen and evaluated today with no new acute complaints or concerns. No acute concerns or events noted overnight.  She is somewhat drowsy this morning.  Objective: Vitals:   08/21/18 1121 08/21/18 1300 08/21/18 2103 08/22/18 0529  BP: (!) 154/90 [name] Kitchen)  156/90 (!) 171/100 (!) 163/102  Pulse: 79 76 80 76  Resp: 18 18 18 18   Temp: 98.4 F (36.9 C) 98.4 F (36.9 C) 99.3 F (37.4 C) 97.7 F (36.5 C)  TempSrc: Oral Oral Oral Oral  SpO2: 98% 94% 97%    Weight:      Height:        Intake/Output Summary (Last 24 hours) at 08/22/2018 1007 Last data filed at 08/21/2018 2311 Gross per 24 hour  Intake 480 ml  Output 950 ml  Net -470 ml   Filed Weights   08/20/18 1140 08/20/18 2107  Weight: 90.4 kg 85.5 kg    Examination:  General exam: Appears calm and comfortable  Respiratory system: Clear to auscultation. Respiratory effort normal. Cardiovascular system: S1 & S2 heard, RRR. No JVD, murmurs, rubs, gallops or clicks. No pedal edema. Gastrointestinal system: Abdomen is nondistended, soft and nontender. No organomegaly or masses felt. Normal bowel sounds heard. Central nervous system: Drowsy. Extremities: Symmetric 5 x 5 power. Skin: No rashes, lesions or ulcers, wounds are dressed to right foot with eschar to right fifth toe and no signs of infection noted. Psychiatry: Cannot be evaluated.    Data Reviewed: I have personally reviewed following labs and imaging studies  CBC: Recent Labs  Lab 08/20/18 1400 08/21/18 0621 08/22/18 0627  WBC 14.0* 11.1* 9.2  NEUTROABS 9.7*  --   --   HGB 13.3 13.7 13.3  HCT 42.6 44.6 42.2  MCV 93.6 93.9 95.0  PLT 477* 463* 027   Basic Metabolic Panel: Recent Labs  Lab 08/20/18 1400 08/21/18 0621 08/22/18 0627  NA 137 138 138  K 4.3 3.4* 4.1  CL 101 102 104  CO2 25 24 26   GLUCOSE 112* 107* 122*  BUN 9 7* 6*  CREATININE 0.82 0.73 0.70  CALCIUM 8.8* 8.8* 8.9  MG  --   --  2.3   GFR: Estimated Creatinine Clearance: 69 mL/min (by C-G formula based on SCr of 0.7 mg/dL). Liver Function Tests: Recent Labs  Lab 08/20/18 1400  AST 29  ALT 18  ALKPHOS 128*  BILITOT 0.6  PROT 7.6  ALBUMIN 3.8   No results for input(s): LIPASE, AMYLASE in the last 168 hours. No results for input(s): AMMONIA in the last 168 hours. Coagulation Profile: No results for input(s): INR, PROTIME in the last 168 hours. Cardiac Enzymes: No results for input(s): CKTOTAL, CKMB, CKMBINDEX, TROPONINI in the  last 168 hours. BNP (last 3 results) No results for input(s): PROBNP in the last 8760 hours. HbA1C: No results for input(s): HGBA1C in the last 72 hours. CBG: No results for input(s): GLUCAP in the last 168 hours. Lipid Profile: No results for input(s): CHOL, HDL, LDLCALC, TRIG, CHOLHDL, LDLDIRECT in the last 72 hours. Thyroid Function Tests: No results for input(s): TSH, T4TOTAL, FREET4, T3FREE, THYROIDAB in the last 72 hours. Anemia Panel: No results for input(s): VITAMINB12, FOLATE, FERRITIN, TIBC, IRON, RETICCTPCT in the last 72 hours. Sepsis Labs: Recent Labs  Lab 08/20/18 1400  LATICACIDVEN 1.7    No results found for this or any previous visit (from the past 240 hour(s)).       Radiology Studies: Ct Abdomen Pelvis Wo Contrast  Result Date: 08/20/2018 CLINICAL DATA:  73 year old female with abdominal pain and distension. EXAM: CT ABDOMEN AND PELVIS WITHOUT CONTRAST TECHNIQUE: Multidetector CT imaging of the abdomen and pelvis was performed following the standard protocol without IV contrast. COMPARISON:  12/17/2015 CT FINDINGS: Please note that parenchymal abnormalities may be  missed without intravenous contrast. Lower chest: No acute abnormality. Hepatobiliary: Hepatic steatosis noted without suspicious focal hepatic abnormality. The patient is status post cholecystectomy. No biliary dilatation. Pancreas: Atrophic without other significant abnormality Spleen: Unremarkable Adrenals/Urinary Tract: The kidneys and adrenal glands are unremarkable except for small nonobstructing RIGHT renal calculi. No hydronephrosis. Increased density within the dependent bladder may represent small bladder calculi. Stomach/Bowel: Stomach is within normal limits. No evidence of bowel wall thickening, distention, or inflammatory changes. Vascular/Lymphatic: Aortic atherosclerosis. No enlarged abdominal or pelvic lymph nodes. Reproductive: Status post hysterectomy. No adnexal masses. Other: No ascites,  pneumoperitoneum or focal collection. Musculoskeletal: No acute or significant osseous findings. Mild multilevel degenerative changes within the lumbar spine noted. IMPRESSION: 1. No acute abnormality 2. Hepatic steatosis 3. Nonobstructing RIGHT renal calculi 4.  Aortic Atherosclerosis (ICD10-I70.0). Electronically Signed   By: Margarette Canada M.D.   On: 08/20/2018 16:39   Ct Head Wo Contrast  Result Date: 08/20/2018 CLINICAL DATA:  73 year old female with altered mental status, recent UTI EXAM: CT HEAD WITHOUT CONTRAST TECHNIQUE: Contiguous axial images were obtained from the base of the skull through the vertex without intravenous contrast. COMPARISON:  Prior head CT 07/30/2018 FINDINGS: Brain: No evidence of acute infarction, hemorrhage, hydrocephalus, extra-axial collection or mass lesion/mass effect. Similar pattern of advanced central and cortical atrophy with ex vacuo dilatation of the lateral ventricles. Confluent periventricular white matter hypoattenuation remains nonspecific but most consistent with advanced chronic microvascular ischemic white matter change. Small lacunar infarcts in the left thalamus. Vascular: No hyperdense vessel or unexpected calcification. Skull: Normal. Negative for fracture or focal lesion. Sinuses/Orbits: Chronic right maxillary sinusitis. Other: None. IMPRESSION: 1. No acute intracranial abnormality. 2. 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 Electronically Signed   By: Jacqulynn Cadet M.D.   On: 08/20/2018 16:38   Dg Chest Portable 1 View  Result Date: 08/20/2018 CLINICAL DATA:  Altered mental status. Shortness of breath. Hypertension. EXAM: PORTABLE CHEST 1 VIEW COMPARISON:  07/30/2018. FINDINGS: Cardiomegaly. No pulmonary venous congestion. No focal infiltrate. No pleural effusion or pneumothorax. IMPRESSION: 1.  Cardiomegaly.  No pulmonary venous congestion. 2.  No acute pulmonary disease.  Electronically Signed   By: Marcello Moores  Register   On: 08/20/2018 13:14        Scheduled Meds:  busPIRone  15 mg Oral BID   cholecalciferol  5,000 Units Oral Daily   clopidogrel  75 mg Oral QHS   cycloSPORINE  1 drop Both Eyes BID   dicyclomine  20 mg Oral Q6H   hydroxychloroquine  200 mg Oral Daily   metoprolol tartrate  25 mg Oral BID   PARoxetine  10 mg Oral QHS   Ensure Max Protein  11 oz Oral BID   QUEtiapine  25 mg Oral QHS   sodium chloride flush  3 mL Intravenous Q12H   Continuous Infusions:  sodium chloride       LOS: 1 day    Time spent: 30 minutes    Zackeriah Kissler Darleen Crocker, DO [facility] Pager [phone]  If 7PM-7AM, please contact night-coverage [email or web address] Password [email or web address] 08/22/2018, 10:07 AM

## 2018-08-23 DIAGNOSIS — F419 Anxiety disorder, unspecified: Secondary | ICD-10-CM

## 2018-08-23 DIAGNOSIS — G894 Chronic pain syndrome: Secondary | ICD-10-CM

## 2018-08-23 DIAGNOSIS — R234 Changes in skin texture: Secondary | ICD-10-CM

## 2018-08-23 DIAGNOSIS — I10 Essential (primary) hypertension: Secondary | ICD-10-CM

## 2018-08-23 DIAGNOSIS — M069 Rheumatoid arthritis, unspecified: Secondary | ICD-10-CM

## 2018-08-23 DIAGNOSIS — R531 Weakness: Secondary | ICD-10-CM

## 2018-08-23 DIAGNOSIS — S90821A Blister (nonthermal), right foot, initial encounter: Secondary | ICD-10-CM

## 2018-08-23 DIAGNOSIS — Z8673 Personal history of transient ischemic attack (TIA), and cerebral infarction without residual deficits: Secondary | ICD-10-CM

## 2018-08-23 LAB — CBC
HCT: 41.5 % (ref 36.0–46.0)
Hemoglobin: 12.6 g/dL (ref 12.0–15.0)
MCH: 29.1 pg (ref 26.0–34.0)
MCHC: 30.4 g/dL (ref 30.0–36.0)
MCV: 95.8 fL (ref 80.0–100.0)
Platelets: 422 10*3/uL — ABNORMAL HIGH (ref 150–400)
RBC: 4.33 MIL/uL (ref 3.87–5.11)
RDW: 13.7 % (ref 11.5–15.5)
WBC: 11.3 10*3/uL — ABNORMAL HIGH (ref 4.0–10.5)
nRBC: 0 % (ref 0.0–0.2)

## 2018-08-23 LAB — BASIC METABOLIC PANEL
ANION GAP: 11 (ref 5–15)
BUN: 7 mg/dL — ABNORMAL LOW (ref 8–23)
CO2: 24 mmol/L (ref 22–32)
Calcium: 8.8 mg/dL — ABNORMAL LOW (ref 8.9–10.3)
Chloride: 103 mmol/L (ref 98–111)
Creatinine, Ser: 0.78 mg/dL (ref 0.44–1.00)
GFR calc Af Amer: >60 mL/min (ref >60)
GFR calc non Af Amer: >60 mL/min (ref >60)
GLUCOSE: 116 mg/dL — AB (ref 70–99)
Potassium: 3.8 mmol/L (ref 3.5–5.1)
Sodium: 138 mmol/L (ref 135–145)

## 2018-08-23 MED ORDER — QUETIAPINE FUMARATE 25 MG PO TABS
12.5000 mg | ORAL_TABLET | Freq: Two times a day (BID) | ORAL | Status: DC
  Administered 2018-08-23 – 2018-08-25 (×4): 12.5 mg via ORAL
  Filled 2018-08-23 (×4): qty 1

## 2018-08-23 NOTE — TOC Initial Note (Addendum)
Transition of Care [facility]) - Initial/Assessment Note    Patient Details  Name: Robin Blackburn MRN: [id number] Date of Birth: [birth year]  Transition [facility]) CM/SW Contact:    Lyana Asbill, Chauncey Reading, RN Phone Number: 08/23/2018, 11:58 AM  Clinical Narrative:   Patient is from home, has a caregiver 2-3 hours a day (pays OOP). She reports she stopped walking due to being scared of falling. She has a regular bed with added rails and a RW. She is agreeable to SNF referral as recommended. Referrals sent to [facility], [place], [place] and [place].              Expected Discharge Plan: Skilled Nursing Facility Barriers to Discharge: Insurance Authorization, SNF Pending bed offer   Patient Goals and CMS Choice Patient states their goals for this hospitalization and ongoing recovery are:: would like to go to rehab short term and be able to go back home CMS Medicare.gov Compare Post Acute Care list provided to:: Patient Choice offered to / list presented to : Patient  Expected Discharge Plan and Services Expected Discharge Plan: [place] Discharge Planning Services: CM Consult   Living arrangements for the past 2 months: Apartment Expected Discharge Date: 08/30/18                        Prior Living Arrangements/Services Living arrangements for the past 2 months: Apartment Lives with:: Self Patient language and need for interpreter reviewed:: No Do you feel safe going back to the place where you live?: Yes      Need for Family Participation in Patient Care: Yes (Comment) Care giver support system in place?: Yes (comment) Current home services: Other (comment)(care giver she pays OOP for) Criminal Activity/Legal Involvement Pertinent to Current Situation/Hospitalization: No - Comment as needed  Activities of Daily Living Home Assistive Devices/Equipment: Hospital bed ADL Screening (condition at time of admission) Patient's cognitive ability adequate to safely  complete daily activities?: Yes Is the patient deaf or have difficulty hearing?: Yes Does the patient have difficulty seeing, even when wearing glasses/contacts?: Yes Does the patient have difficulty concentrating, remembering, or making decisions?: Yes Patient able to express need for assistance with ADLs?: Yes Does the patient have difficulty dressing or bathing?: Yes Independently performs ADLs?: No Communication: Independent Dressing (OT): Dependent Grooming: Dependent Feeding: Dependent Is this a change from baseline?: Pre-admission baseline Bathing: Dependent Toileting: Dependent In/Out Bed: Dependent Is this a change from baseline?: Pre-admission baseline Walks in Home: Dependent Is this a change from baseline?: Pre-admission baseline Does the patient have difficulty walking or climbing stairs?: Yes Weakness of Legs: Both Weakness of Arms/Hands: Both  Permission Sought/Granted   Permission granted to share information with : Yes, Verbal Permission Granted     Permission granted to share info w AGENCY: SNF facilities        Emotional Assessment Appearance:: Appears stated age Attitude/Demeanor/Rapport: Engaged Affect (typically observed): Calm, Accepting Orientation: : Oriented to Self, Oriented to Place, Oriented to  Time, Oriented to Situation Alcohol / Substance Use: Not Applicable    Admission diagnosis:  Weakness [R53.1] Patient Active Problem List   Diagnosis Date Noted  . Acute encephalopathy 08/21/2018  . Eschar of toe   . Blister of foot without infection, right, initial encounter   . Confusion 08/20/2018  . Bedbound 08/20/2018  . Toxic encephalopathy   . Dehydration   . Lactic acidosis   . SIRS (systemic inflammatory response syndrome) (HCC)   .  Acute metabolic encephalopathy   . Acute lower UTI 11/25/2016  . Confusion associated with infection (UTI) 11/25/2016  . Urinary tract infection without hematuria 04/02/2016  . Benzodiazepine withdrawal  with perceptual disturbance ([place]) 04/02/2016  . Acute pain of left knee 12/18/2015  . Osteoporosis 11/01/2015  . Left foot pain 11/01/2015  . Vitamin D deficiency 10/11/2015  . History of fracture of left ankle 09/28/2015  . Chronic pain in left foot 09/28/2015  . Fibromyalgia 09/28/2015  . Rash and nonspecific skin eruption 07/17/2015  . Cerumen impaction 05/30/2015  . Edema 04/16/2015  . Cellulitis 04/16/2015  . Mouth pain 04/08/2015  . Pain in joint, lower leg 04/08/2015  . GERD (gastroesophageal reflux disease) 04/05/2015  . Occult blood positive stool 01/17/2015  . Diarrhea 01/05/2015  . Chronic pain syndrome 12/02/2014  . Pain in joint, upper arm 10/29/2014  . Nasal congestion 10/29/2014  . Allergic rhinitis 08/20/2014  . Conjunctivitis 08/08/2014  . Dysuria 06/27/2014  . Back pain, lumbosacral 06/23/2014  . Sinusitis, chronic 06/17/2014  . Fall 05/24/2014  . H/O: CVA (cerebrovascular accident) 05/24/2014  . HLD (hyperlipidemia) 05/24/2014  . Physical deconditioning 05/24/2014  . Fracture of fifth metatarsal bone of right foot 03/06/2014  . Right sided weakness 02/26/2014  . Leukocytosis 02/20/2014  . Rheumatoid arthritis ([place]) 02/20/2014  . CVA (cerebral infarction) 02/19/2014  . Right ankle sprain 02/19/2014  . Contusion of right foot 02/19/2014  . Essential hypertension 02/19/2014  . Neuropathy 02/19/2014  . Anxiety 02/19/2014  . Hyponatremia 02/19/2014  . Hypokalemia 02/19/2014   PCP:  Redmond School, MD Pharmacy:   [facility] [place], [place] [address] [place] [address] Phone: [phone] Fax: [phone]     Social Determinants of Health (SDOH) Interventions    Readmission Risk Interventions 30 Day Unplanned Readmission Risk Score     ED to Hosp-Admission (Current) from 08/20/2018 in [place]  30 Day Unplanned Readmission Risk Score (%)  21 Filed at 08/23/2018 0801      This score is the patient's risk of an unplanned readmission within 30 days of being discharged (0 -100%). The score is based on dignosis, age, lab data, medications, orders, and past utilization.   Low:  0-14.9   Medium: 15-21.9   High: 22-29.9   Extreme: 30 and above       No flowsheet data found.

## 2018-08-23 NOTE — Progress Notes (Signed)
PROGRESS NOTE    Robin Blackburn  PXT:[id number] DOB: 04/05/1946 DOA: 08/20/2018 PCP: Redmond School, MD   Brief Narrative:  Per HPI: Robin Eye Priceis a 73 y.o.femalewith medical history significant offunctional quadriplegic, history of CVA, fibromyalgia, rheumatoid arthritis, hypertension lives at home is bedbound has a caretaker that comes in 2 to 3 hours a day. Patient was recently hospitalized less than a month ago and was offered nursing home which she refused. Since that time family has been unable to take care of her at home. She has been hallucinating more also that is been going on for over a week per ED report. She is been seeing rats and there is none. She is denying any hallucinations at this time. There is been no fever. She denies any nausea vomiting or diarrhea. She was recently treated for UTI. Patient is being referred for admission for hallucinations and the need for placement.  Patient was admitted with acute encephalopathy with persistent hallucinations and need for placement.  She is noted to have an eschar to her right fifth toe which has been evaluated by general surgery with no need for debridement currently noted.  She is essentially stable from a psychiatric standpoint and is awaiting placement.  She has been evaluated by physical therapy and confirm need for SNF.  Assessment & Plan:   Principal Problem:   Acute metabolic encephalopathy Active Problems:   Essential hypertension   Anxiety   Rheumatoid arthritis (HCC)   H/O: CVA (cerebrovascular accident)   Chronic pain syndrome   Fibromyalgia   Benzodiazepine withdrawal with perceptual disturbance (HCC)   Bedbound   Acute encephalopathy   Eschar of toe   Blister of foot without infection, right, initial encounter   1. Persistent hallucinations with questionable acute on chronic encephalopathy likely secondary to polypharmacy-improving/stable at this time.  Patient was recently hospitalized for UTI  which was thought to be the cause and she refused skilled nursing facility placement at that time.  Patient completed antibiotic therapy as prescribed and there is no signs of acute infection currently. She unfortunately continues to have persistence of the symptoms as an outpatient, which are believe to be related to her multiple narcotics, anxiolytic, and antipsychotic medications.  She will more than likely require psychiatry evaluation in the outpatient setting, but is no meeting criteria for inpatient evaluation; especially now that she is denying any hallucination and mentation is once again oriented x3.  After discussing medication changes with patient she has requested to have her Paxil discontinued; will also adjust Seroquel to be given twice a day. Follow mentation status and orientation. 2. Necrotic changes noted to right fifth toe.  Appreciate general surgery evaluation and recommendations.  ABI demonstrated normal ankle-brachial indices bilaterally.  There is no drainage out of the wounds or signs of superimposed infection.  Continue wound care for now.   3. Essential hypertension- much better controlled.  Continue current antihypertensive regimen. 4. History of rheumatoid arthritis.  Stable.  Continue current medications. 5. History of CVA.  Continue aspirin.  No new focal deficits appreciated. 6. Chronic pain syndrome.  Continue narcotic medications and as needed muscle relaxants. 7. Bedbound status with need for placement.  PT evaluation appreciated with need for SNF/rehab.  Case manager/social worker informed and on board for assistance with discharge plans.   DVT prophylaxis: SCDs Code Status: Full Family Communication: None at bedside Disposition Plan:  Hemodynamically stable and ready for discharge to skilled nursing facility when bed available.  Continue supportive care.  Case manager/social worker helping with discharge plans.   Consultants:   General surgery   Procedures:   ABI: Normal ankle-brachial indices bilaterally appreciated on exam.  Antimicrobials:   None   Subjective: Afebrile, in no acute distress.  Denies chest pain, nausea, vomiting or abdominal discomfort.  She denies hallucination and is currently oriented x3.  Weak and generally deconditioned.  Objective: Vitals:   08/23/18 0539 08/23/18 0539 08/23/18 0825 08/23/18 1434  BP: (!) 128/58 (!) 128/58 (!) 167/91 (!) 168/93  Pulse: 76 79 89   Resp:  18 18 18   Temp: 98.9 F (37.2 C) 98.9 F (37.2 C) 98.8 F (37.1 C) 98.9 F (37.2 C)  TempSrc: Oral Oral Oral Oral  SpO2: 97% 98% 98% 97%  Weight:      Height:        Intake/Output Summary (Last 24 hours) at 08/23/2018 1749 Last data filed at 08/23/2018 1435 Gross per 24 hour  Intake 480 ml  Output 300 ml  Net 180 ml   Filed Weights   08/20/18 1140 08/20/18 2107  Weight: 90.4 kg 85.5 kg    Examination: General exam: Alert, awake, oriented x 3 currently; afebrile and appears to be comfortable in no distress.  Patient denies hallucinations at this moment. Respiratory system: Clear to auscultation. Respiratory effort normal.  No requiring oxygen supplementation. Cardiovascular system:RRR. No murmurs, rubs, gallops. Gastrointestinal system: Abdomen is nondistended, soft and nontender. No organomegaly or masses felt. Normal bowel sounds heard. Central nervous system: Alert and oriented. No focal neurological deficits. Extremities: No C/C/E, +pedal pulses Skin: No petechiae, eschar to right fifth toe and dorsal lateral aspect of her foot without active drainage or concerns for superimposed infection.  Dressings in place. Psychiatry: Mood and affect stable/appropriate at this moment.   Data Reviewed: I have personally reviewed following labs and imaging studies  CBC: Recent Labs  Lab 08/20/18 1400 08/21/18 0621 08/22/18 0627 08/23/18 0519  WBC 14.0* 11.1* 9.2 11.3*  NEUTROABS 9.7*  --   --   --   HGB 13.3 13.7  13.3 12.6  HCT 42.6 44.6 42.2 41.5  MCV 93.6 93.9 95.0 95.8  PLT 477* 463* 400 858*   Basic Metabolic Panel: Recent Labs  Lab 08/20/18 1400 08/21/18 0621 08/22/18 0627 08/23/18 0519  NA 137 138 138 138  K 4.3 3.4* 4.1 3.8  CL 101 102 104 103  CO2 25 24 26 24   GLUCOSE 112* 107* 122* 116*  BUN 9 7* 6* 7*  CREATININE 0.82 0.73 0.70 0.78  CALCIUM 8.8* 8.8* 8.9 8.8*  MG  --   --  2.3  --    GFR: Estimated Creatinine Clearance: 69 mL/min (by C-G formula based on SCr of 0.78 mg/dL).   Liver Function Tests: Recent Labs  Lab 08/20/18 1400  AST 29  ALT 18  ALKPHOS 128*  BILITOT 0.6  PROT 7.6  ALBUMIN 3.8   Sepsis Labs: Recent Labs  Lab 08/20/18 1400  LATICACIDVEN 1.7    Radiology Studies: US Arterial Abi (screening Lower Extremity)  Result Date: 08/23/2018 CLINICAL DATA:  Quadriplegia with necrotic right fifth toe. Ulcer on right foot dorsum. EXAM: NONINVASIVE PHYSIOLOGIC VASCULAR STUDY OF BILATERAL LOWER EXTREMITIES TECHNIQUE: Evaluation of both lower extremities were performed at rest, including calculation of ankle-brachial indices with single level Doppler, pressure and pulse volume recording. COMPARISON:  None. FINDINGS: Right ABI:  1.02 Left ABI:  0.97 Right Lower Extremity:  Normal arterial waveforms at the ankle. Left Lower Extremity:  Normal arterial waveforms  at the ankle. IMPRESSION: Normal ankle-brachial indices bilaterally. Electronically Signed   By: Markus Daft M.D.   On: 08/23/2018 07:41    Scheduled Meds: . busPIRone  15 mg Oral BID  . cholecalciferol  5,000 Units Oral Daily  . clopidogrel  75 mg Oral QHS  . cycloSPORINE  1 drop Both Eyes BID  . dicyclomine  20 mg Oral Q6H  . hydroxychloroquine  200 mg Oral Daily  . metoprolol tartrate  25 mg Oral BID  . pantoprazole  40 mg Oral BID AC  . Ensure Max Protein  11 oz Oral BID  . QUEtiapine  12.5 mg Oral BID  . sodium chloride flush  3 mL Intravenous Q12H   Continuous Infusions: . sodium chloride        LOS: 2 days    Time spent: 25 minutes    Barton Dubois, MD [facility] Pager [phone]  08/23/2018, 5:49 PM

## 2018-08-23 NOTE — NC FL2 (Addendum)
[place] LEVEL OF CARE SCREENING TOOL     IDENTIFICATION  Patient Name: Robin Blackburn Birthdate: [birth year] Sex: female Admission Date (Current Location): 08/20/2018  [facility] and [place] Number:  [employer] and Address:  [facility] [address] [place]      Provider Number: [id number]  Attending Physician Name and Address:  Barton Dubois, MD  Relative Name and Phone Number:  Pat, [place] [phone]  [phone]     Current Level of Care: Hospital Recommended Level of Care: [place] Prior Approval Number:    Date Approved/Denied: 02/20/14 PASRR Number: [id number]  Discharge Plan: SNF    Current Diagnoses: Patient Active Problem List   Diagnosis Date Noted  . Acute encephalopathy 08/21/2018  . Eschar of toe   . Blister of foot without infection, right, initial encounter   . Confusion 08/20/2018  . Bedbound 08/20/2018  . Toxic encephalopathy   . Dehydration   . Lactic acidosis   . SIRS (systemic inflammatory response syndrome) (HCC)   . Acute metabolic encephalopathy   . Acute lower UTI 11/25/2016  . Confusion associated with infection (UTI) 11/25/2016  . Urinary tract infection without hematuria 04/02/2016  . Benzodiazepine withdrawal with perceptual disturbance ([place]) 04/02/2016  . Acute pain of left knee 12/18/2015  . Osteoporosis 11/01/2015  . Left foot pain 11/01/2015  . Vitamin D deficiency 10/11/2015  . History of fracture of left ankle 09/28/2015  . Chronic pain in left foot 09/28/2015  . Fibromyalgia 09/28/2015  . Rash and nonspecific skin eruption 07/17/2015  . Cerumen impaction 05/30/2015  . Edema 04/16/2015  . Cellulitis 04/16/2015  . Mouth pain 04/08/2015  . Pain in joint, lower leg 04/08/2015  . GERD (gastroesophageal reflux disease) 04/05/2015  . Occult blood positive stool 01/17/2015  . Diarrhea 01/05/2015  . Chronic pain syndrome 12/02/2014  . Pain in joint,  upper arm 10/29/2014  . Nasal congestion 10/29/2014  . Allergic rhinitis 08/20/2014  . Conjunctivitis 08/08/2014  . Dysuria 06/27/2014  . Back pain, lumbosacral 06/23/2014  . Sinusitis, chronic 06/17/2014  . Fall 05/24/2014  . H/O: CVA (cerebrovascular accident) 05/24/2014  . HLD (hyperlipidemia) 05/24/2014  . Physical deconditioning 05/24/2014  . Fracture of fifth metatarsal bone of right foot 03/06/2014  . Right sided weakness 02/26/2014  . Leukocytosis 02/20/2014  . Rheumatoid arthritis ([place]) 02/20/2014  . CVA (cerebral infarction) 02/19/2014  . Right ankle sprain 02/19/2014  . Contusion of right foot 02/19/2014  . Essential hypertension 02/19/2014  . Neuropathy 02/19/2014  . Anxiety 02/19/2014  . Hyponatremia 02/19/2014  . Hypokalemia 02/19/2014    Orientation RESPIRATION BLADDER Height & Weight     Self, Time, Situation, Place  Normal External catheter Weight: 85.5 kg Height:  5\' 6"  (167.6 cm)  BEHAVIORAL SYMPTOMS/MOOD NEUROLOGICAL BOWEL NUTRITION STATUS      Continent Diet(heart healthy)  AMBULATORY STATUS COMMUNICATION OF NEEDS Skin   Extensive Assist Verbally Other (Comment)(skin tear-right foot)                       Personal Care Assistance Level of Assistance  Bathing, Feeding, Dressing Bathing Assistance: Maximum assistance Feeding assistance: Independent Dressing Assistance: Maximum assistance     Functional Limitations Info  Sight, Hearing, Speech Sight Info: Adequate Hearing Info: Adequate Speech Info: Adequate    SPECIAL CARE FACTORS FREQUENCY  PT (By licensed PT)     PT Frequency: 5x/week  Contractures Contractures Info: Present(right and left ankle)    Additional Factors Info  Code Status, Allergies, Psychotropic Code Status Info: Full code Allergies Info: Demerol, hydromorphone, keflex, penicillin, sulfa, Bisphosphonates, fentanyl, percocet, Iohexol, shellfish, cipro, levaquin, morphine, Terramycin,  codeine Psychotropic Info: xanax, buspar, paxil, seroquel         Current Medications (08/23/2018):  This is the current hospital active medication list Current Facility-Administered Medications  Medication Dose Route Frequency Provider Last Rate Last Dose  . 0.9 %  sodium chloride infusion  250 mL Intravenous PRN Phillips Grout, MD      . acetaminophen (TYLENOL) tablet 500 mg  500 mg Oral Q6H PRN Manuella Ghazi, Pratik D, DO   500 mg at 08/22/18 1518  . ALPRAZolam Duanne Moron) tablet 0.5 mg  0.5 mg Oral TID PRN Phillips Grout, MD   0.5 mg at 08/23/18 0828  . alum & mag hydroxide-simeth (MAALOX/MYLANTA) 200-200-20 MG/5ML suspension 30 mL  30 mL Oral Q6H PRN Phillips Grout, MD   30 mL at 08/21/18 0120  . busPIRone (BUSPAR) tablet 15 mg  15 mg Oral BID Phillips Grout, MD   15 mg at 08/23/18 0827  . cholecalciferol (VITAMIN D3) tablet 5,000 Units  5,000 Units Oral Daily Phillips Grout, MD   5,000 Units at 08/23/18 [phone]  . clopidogrel (PLAVIX) tablet 75 mg  75 mg Oral QHS Phillips Grout, MD   75 mg at 08/22/18 2208  . cycloSPORINE (RESTASIS) 0.05 % ophthalmic emulsion 1 drop  1 drop Both Eyes BID Phillips Grout, MD   1 drop at 08/23/18 5397  . dicyclomine (BENTYL) capsule 20 mg  20 mg Oral Q6H Derrill Kay A, MD   20 mg at 08/23/18 1512  . hydrALAZINE (APRESOLINE) injection 10 mg  10 mg Intravenous Q4H PRN Manuella Ghazi, Pratik D, DO   10 mg at 08/22/18 1343  . HYDROcodone-acetaminophen (NORCO/VICODIN) 5-325 MG per tablet 1 tablet  1 tablet Oral Q6H PRN Phillips Grout, MD   1 tablet at 08/23/18 1512  . hydroxychloroquine (PLAQUENIL) tablet 200 mg  200 mg Oral Daily Derrill Kay A, MD   200 mg at 08/23/18 6734  . methocarbamol (ROBAXIN) tablet 750 mg  750 mg Oral Q6H PRN Manuella Ghazi, Pratik D, DO   750 mg at 08/23/18 1512  . metoprolol tartrate (LOPRESSOR) tablet 25 mg  25 mg Oral BID Phillips Grout, MD   25 mg at 08/23/18 0825  . ondansetron (ZOFRAN) tablet 4 mg  4 mg Oral Q6H PRN Phillips Grout, MD   4 mg at  08/21/18 1309   Or  . ondansetron (ZOFRAN) injection 4 mg  4 mg Intravenous Q6H PRN Phillips Grout, MD   4 mg at 08/22/18 2006  . pantoprazole (PROTONIX) EC tablet 40 mg  40 mg Oral BID AC Shah, Pratik D, DO   40 mg at 08/23/18 0825  . PARoxetine (PAXIL) tablet 10 mg  10 mg Oral QHS Derrill Kay A, MD   10 mg at 08/22/18 2208  . protein supplement (ENSURE MAX) liquid  11 oz Oral BID Manuella Ghazi, Pratik D, DO   11 oz at 08/21/18 1310  . QUEtiapine (SEROQUEL) tablet 25 mg  25 mg Oral QHS Derrill Kay A, MD   25 mg at 08/22/18 2208  . simethicone (MYLICON) chewable tablet 80 mg  80 mg Oral QID PRN Manuella Ghazi, Pratik D, DO      . sodium chloride flush (NS) 0.9 % injection 3 mL  3 mL Intravenous Q12H Derrill Kay A, MD   3 mL at 08/23/18 3557  . sodium chloride flush (NS) 0.9 % injection 3 mL  3 mL Intravenous PRN Phillips Grout, MD         Discharge Medications: Please see discharge summary for a list of discharge medications.  Relevant Imaging Results:  Relevant Lab Results:   Additional Information [id number]  Azilee Pirro, Chauncey Reading, RN

## 2018-08-23 NOTE — Care Management Important Message (Signed)
Important Message  Patient Details  Name: Robin Blackburn MRN: [id number] Date of Birth: 21-Jul-1945   Medicare Important Message Given:  Yes    Tommy Medal 08/23/2018, 3:35 PM

## 2018-08-24 NOTE — Progress Notes (Signed)
PROGRESS NOTE    Robin Blackburn  PYP:[id number] DOB: 22-Nov-1945 DOA: 08/20/2018 PCP: Redmond School, MD   Brief Narrative:  Per HPI: Robin Guilliams Priceis a 73 y.o.femalewith medical history significant offunctional quadriplegic, history of CVA, fibromyalgia, rheumatoid arthritis, hypertension lives at home is bedbound has a caretaker that comes in 2 to 3 hours a day. Patient was recently hospitalized less than a month ago and was offered nursing home which she refused. Since that time family has been unable to take care of her at home. She has been hallucinating more also that is been going on for over a week per ED report. She is been seeing rats and there is none. She is denying any hallucinations at this time. There is been no fever. She denies any nausea vomiting or diarrhea. She was recently treated for UTI. Patient is being referred for admission for hallucinations and the need for placement.  Patient was admitted with acute encephalopathy with persistent hallucinations and need for placement.  She is noted to have an eschar to her right fifth toe which has been evaluated by general surgery with no need for debridement currently noted.  She is essentially stable from a psychiatric standpoint and is awaiting placement.  She has been evaluated by physical therapy and confirm need for SNF.  Assessment & Plan:   Principal Problem:   Acute metabolic encephalopathy Active Problems:   Essential hypertension   Anxiety   Rheumatoid arthritis (HCC)   H/O: CVA (cerebrovascular accident)   Chronic pain syndrome   Fibromyalgia   Benzodiazepine withdrawal with perceptual disturbance (HCC)   Bedbound   Acute encephalopathy   Eschar of toe   Blister of foot without infection, right, initial encounter   1. Persistent hallucinations with questionable acute on chronic encephalopathy likely secondary to polypharmacy-improving/stable at this time.  Patient was recently hospitalized for UTI  which was thought to be the cause and she refused skilled nursing facility placement at that time.  Patient completed antibiotic therapy as prescribed and there is no signs of acute infection currently. She unfortunately continues to have persistence of the symptoms as an outpatient, which are believe to be related to her multiple narcotics, anxiolytic, and antipsychotic medications.  She will more than likely require psychiatry evaluation in the outpatient setting, but is no meeting criteria for inpatient evaluation; especially now that she is denying any hallucination and mentation is once again oriented x3.  After discussing medication changes with patient she has requested to have her Paxil discontinued; will also adjust Seroquel to be given twice a day. Follow mentation status and orientation. 2. Necrotic changes noted to right fifth toe.  Appreciate general surgery evaluation and recommendations.  ABI demonstrated normal ankle-brachial indices bilaterally.  There is no drainage out of the wounds or signs of superimposed infection.  Continue wound care for now.   3. Essential hypertension- much better controlled.  Continue current antihypertensive regimen. 4. History of rheumatoid arthritis.  Stable.  Continue current medications. 5. History of CVA.  Continue aspirin.  No new focal deficits appreciated. 6. Chronic pain syndrome.  Continue narcotic medications and as needed muscle relaxants. 7. Bedbound status with need for placement.  PT evaluation appreciated with need for SNF/rehab.  Case manager/social worker informed and on board for assistance with discharge plans.   DVT prophylaxis: SCDs Code Status: Full Family Communication: None at bedside Disposition Plan:  Hemodynamically stable and ready for discharge to skilled nursing facility when bed available.  Continue supportive care.  Case manager/social worker helping with discharge plans.   Consultants:   General surgery   Procedures:   ABI: Normal ankle-brachial indices bilaterally appreciated on exam.  Antimicrobials:   None   Subjective: No fever, no nausea, no vomiting, no chest pain.  Oriented x3 currently and in no acute distress.  Objective: Vitals:   08/24/18 0913 08/24/18 1000 08/24/18 1333 08/24/18 1400  BP: (!) 168/101 (!) 155/88 (!) 176/82 (!) 160/80  Pulse:  86 82 84  Resp:  19 20 18   Temp:  98.3 F (36.8 C) 98 F (36.7 C) 98.3 F (36.8 C)  TempSrc:  Oral  Oral  SpO2:  98% 100% 99%  Weight:      Height:        Intake/Output Summary (Last 24 hours) at 08/24/2018 1503 Last data filed at 08/24/2018 1300 Gross per 24 hour  Intake 720 ml  Output 900 ml  Net -180 ml   Filed Weights   08/20/18 1140 08/20/18 2107  Weight: 90.4 kg 85.5 kg    Examination: General exam: Alert, awake, oriented x 3; afebrile.  In no acute distress.  No further hallucination has been described at this time. Respiratory system: Clear to auscultation. Respiratory effort normal. Cardiovascular system:RRR. No murmurs, rubs, gallops. Gastrointestinal system: Abdomen is nondistended, soft and nontender. No organomegaly or masses felt. Normal bowel sounds heard. Central nervous system: Alert and oriented. No focal neurological deficits. Extremities: No C/C/E, +pedal pulses Skin: No petechiae; eschar lesion appreciated on the right fifth toe and dorsal lateral aspect of her foot; there is no active drainage or concern for superimposed infection.  Dressing in place. Psychiatry: Normal mood and affect.   Data Reviewed: I have personally reviewed following labs and imaging studies  CBC: Recent Labs  Lab 08/20/18 1400 08/21/18 0621 08/22/18 0627 08/23/18 0519  WBC 14.0* 11.1* 9.2 11.3*  NEUTROABS 9.7*  --   --   --   HGB 13.3 13.7 13.3 12.6  HCT 42.6 44.6 42.2 41.5  MCV 93.6 93.9 95.0 95.8  PLT 477* 463* 400 948*   Basic Metabolic Panel: Recent Labs  Lab 08/20/18 1400 08/21/18 0621 08/22/18  0627 08/23/18 0519  NA 137 138 138 138  K 4.3 3.4* 4.1 3.8  CL 101 102 104 103  CO2 25 24 26 24   GLUCOSE 112* 107* 122* 116*  BUN 9 7* 6* 7*  CREATININE 0.82 0.73 0.70 0.78  CALCIUM 8.8* 8.8* 8.9 8.8*  MG  --   --  2.3  --    GFR: Estimated Creatinine Clearance: 69 mL/min (by C-G formula based on SCr of 0.78 mg/dL).   Liver Function Tests: Recent Labs  Lab 08/20/18 1400  AST 29  ALT 18  ALKPHOS 128*  BILITOT 0.6  PROT 7.6  ALBUMIN 3.8   Sepsis Labs: Recent Labs  Lab 08/20/18 1400  LATICACIDVEN 1.7    Radiology Studies: No results found.  Scheduled Meds: . busPIRone  15 mg Oral BID  . cholecalciferol  5,000 Units Oral Daily  . clopidogrel  75 mg Oral QHS  . cycloSPORINE  1 drop Both Eyes BID  . dicyclomine  20 mg Oral Q6H  . hydroxychloroquine  200 mg Oral Daily  . metoprolol tartrate  25 mg Oral BID  . pantoprazole  40 mg Oral BID AC  . Ensure Max Protein  11 oz Oral BID  . QUEtiapine  12.5 mg Oral BID  . sodium chloride flush  3 mL Intravenous Q12H   Continuous  Infusions: . sodium chloride       LOS: 3 days    Time spent: 25 minutes    Barton Dubois, MD [facility] Pager [phone]  08/24/2018, 3:03 PM

## 2018-08-24 NOTE — Progress Notes (Signed)
Physical Therapy Treatment Patient Details Name: Robin Blackburn MRN: [id number] DOB: [birth year] Today's Date: 08/24/2018    History of Present Illness Robin Blackburn is a 73 y.o. female with medical history significant of functional quadriplegic, history of CVA, fibromyalgia, rheumatoid arthritis, hypertension lives at home is bedbound has a caretaker that comes in 2 to 3 hours a day.  Patient was recently hospitalized less than a month ago and was offered nursing home which she refused.  Since that time family has been unable to take care of her at home.  She has been hallucinating more also that is been going on for over a week per ED report.  She is been seeing rats and there is none.  She is denying any hallucinations at this time.  There is been no fever.  She denies any nausea vomiting or diarrhea.  She was recently treated for UTI.  Patient is being referred for admission for hallucinations and the need for placement.    PT Comments    Patient presents supine in bed receiving medication from RN and agreeable to therapy with encouragement. Pt able to complete supine<>sit transfers x2 reps with mod assist for uprighting trunk when rising to sitting and mod assist to raise BLE back into bed when lying down. Pt requires min to mod assist for static, supported sitting at EOB due to leaning L and posterior with minimal improvements when cued verbally and tactiley, limited to <5 min for 2 bouts secondary to fatigue and generalized weakness. Pt able to complete sit to stand transfers from EOB with RW with mod assist, elevated bed, verbal cues for hand placement and to shift weight into feet to rise. Pt limited in standing tolerance able to sustain for 1 min x2 reps with BLE supported on RW and mod assist to maintain. Pt demonstrates inverted B ankles in standing causing medial plantar surface and great to to rise off of floor in standing with minimal improvements with verbal cues. Pt denies pain in standing,  but reports fatigue requiring seated rest break. Pt performs rolling supine to R/L sidelying due to bed soiled with urine to change linen. Nurse tech in room to change linen and pt left supine in bed with call bell in reach and nurse tech at bedside for pt care at end of treatment. Pt continues to be limited by generalized weakness and deconditioning. Patient will benefit from continued physical therapy in hospital and recommended venue below to increase strength, balance, endurance for safe ADLs and gait.    Follow Up Recommendations  LTACH;SNF     Equipment Recommendations  None recommended by PT    Recommendations for Other Services       Precautions / Restrictions Precautions Precautions: Fall Restrictions Weight Bearing Restrictions: No    Mobility  Bed Mobility Overal bed mobility: Needs Assistance Bed Mobility: Rolling;Supine to Sit;Sit to Supine Rolling: Min assist   Supine to sit: Mod assist Sit to supine: Mod assist   General bed mobility comments: increased time, use of bedrail, trunk uprighting assistance for supine to sit and lifting BLE assistance for sit to supine, min assist to scoot to EOB   Transfers Overall transfer level: Needs assistance Equipment used: Rolling walker (2 wheeled) Transfers: Sit to/from Stand Sit to Stand: Mod assist         General transfer comment: Pt with back of BLE braced on bed to rise, verbal cues for hand placement, elevated bed, maintains B ankles inverted with medial side of  feet elevated off of floor, flexed knees and with unsteadiness in standing  Ambulation/Gait             General Gait Details: unable to take steps; pt reports unknown last ambulation period   Stairs             Wheelchair Mobility    Modified Rankin (Stroke Patients Only)       Balance Overall balance assessment: Needs assistance Sitting-balance support: Bilateral upper extremity supported;Feet supported Sitting balance-Leahy Scale:  Poor Sitting balance - Comments: min/mod assist to maintain static sitting at EOB, verbal and tactile cues to promote equal weight-bearing through B hips with flat foot posture Postural control: Left lateral lean   Standing balance-Leahy Scale: Poor Standing balance comment: mod assist to maintain static, supported standing with RW                            Cognition Arousal/Alertness: Awake/alert Behavior During Therapy: WFL for tasks assessed/performed Overall Cognitive Status: Within Functional Limits for tasks assessed                                        Exercises      General Comments        Pertinent Vitals/Pain Pain Assessment: 0-10 Pain Score: 10-Worst pain ever Pain Location: L foot Pain Descriptors / Indicators: Aching Pain Intervention(s): Limited activity within patient's tolerance;Monitored during session;Premedicated before session    Home Living                      Prior Function            PT Goals (current goals can now be found in the care plan section) Acute Rehab PT Goals Patient Stated Goal: to get out of the hospital to go somewhere she can get stronger PT Goal Formulation: With patient Time For Goal Achievement: 08/28/18 Potential to Achieve Goals: Fair Progress towards PT goals: Progressing toward goals    Frequency    Min 2X/week      PT Plan Current plan remains appropriate    Co-evaluation              AM-PAC PT "6 Clicks" Mobility   Outcome Measure  Help needed turning from your back to your side while in a flat bed without using bedrails?: A Little Help needed moving from lying on your back to sitting on the side of a flat bed without using bedrails?: A Lot Help needed moving to and from a bed to a chair (including a wheelchair)?: Total Help needed standing up from a chair using your arms (e.g., wheelchair or bedside chair)?: A Lot Help needed to walk in hospital room?:  Total Help needed climbing 3-5 steps with a railing? : Total 6 Click Score: 10    End of Session   Activity Tolerance: Patient limited by fatigue;Patient limited by pain Patient left: in bed;with call bell/phone within reach;Other (comment)(nurse tech at bedside) Nurse Communication: Mobility status PT Visit Diagnosis: Unsteadiness on feet (R26.81);Other abnormalities of gait and mobility (R26.89);Muscle weakness (generalized) (M62.81);Difficulty in walking, not elsewhere classified (R26.2)     Time: 7824-2353 PT Time Calculation (min) (ACUTE ONLY): 22 min  Charges:  $Therapeutic Activity: 8-22 mins  10:37 AM, 08/24/18 Talbot Grumbling, DPT Physical Therapist with [facility] [phone] office

## 2018-08-24 NOTE — Care Management (Signed)
Patient has accepted bed offer with [facility]. Robin Blackburn will start insurance authorization today.

## 2018-08-25 DIAGNOSIS — R531 Weakness: Secondary | ICD-10-CM

## 2018-08-25 DIAGNOSIS — M797 Fibromyalgia: Secondary | ICD-10-CM

## 2018-08-25 MED ORDER — METHOCARBAMOL 750 MG PO TABS: 750.0000 mg | ORAL_TABLET | Freq: Four times a day (QID) | ORAL | Status: DC | PRN

## 2018-08-25 MED ORDER — HYDROCODONE-ACETAMINOPHEN 5-325 MG PO TABS: 1.0000 | ORAL_TABLET | Freq: Four times a day (QID) | ORAL | 0 refills | Status: DC | PRN

## 2018-08-25 MED ORDER — ENSURE MAX PROTEIN PO LIQD: 11.0000 [oz_av] | Freq: Two times a day (BID) | ORAL | Status: DC

## 2018-08-25 MED ORDER — ALPRAZOLAM 0.5 MG PO TABS: 0.5000 mg | ORAL_TABLET | Freq: Three times a day (TID) | ORAL | 0 refills | Status: DC | PRN

## 2018-08-25 MED ORDER — QUETIAPINE FUMARATE 25 MG PO TABS: 12.5000 mg | ORAL_TABLET | Freq: Two times a day (BID) | ORAL | Status: DC

## 2018-08-25 NOTE — Discharge Summary (Signed)
Physician Discharge Summary  LOANA SALVAGGIO XKG:[id number] DOB: [birth year] DOA: 08/20/2018  PCP: Redmond School, MD  Admit date: 08/20/2018 Discharge date: 08/25/2018  Time spent: 35 minutes  Recommendations for Outpatient Follow-up:  1. Reassess blood pressure and further adjust antihypertensive regimen as needed 2. Repeat basic metabolic panel to follow electrolytes and renal function. 3. Arrange outpatient follow-up with psychiatry service.    Discharge Diagnoses:  Principal Problem:   Acute metabolic encephalopathy Active Problems:   Essential hypertension   Anxiety   Rheumatoid arthritis (HCC)   H/O: CVA (cerebrovascular accident)   Chronic pain syndrome   Fibromyalgia   Benzodiazepine withdrawal with perceptual disturbance (HCC)   Bedbound   Acute encephalopathy   Eschar of toe   Blister of foot without infection, right, initial encounter   Weakness   Discharge Condition: Stable and overall improved.  No hallucinations at discharge.  Patient discharged to skilled nursing facility for further care and rehabilitation.  Instructed to follow-up with PCP in 2 weeks after discharge from SNF.  CODE STATUS: Full code  Diet recommendation: Heart healthy diet  Filed Weights   08/20/18 1140 08/20/18 2107  Weight: 90.4 kg 85.5 kg    History of present illness:  As per H&P written by Dr. Shanon Brow on 08/20/2018 73 y.o. female with medical history significant of functional quadriplegic, history of CVA, fibromyalgia, rheumatoid arthritis, hypertension lives at home is bedbound has a caretaker that comes in 2 to 3 hours a day.  Patient was recently hospitalized less than a month ago and was offered nursing home which she refused.  Since that time family has been unable to take care of her at home.  She has been hallucinating more also that is been going on for over a week per ED report.  She is been seeing rats and there is none.  She is denying any hallucinations at this time.  There is  been no fever.  She denies any nausea vomiting or diarrhea.  She was recently treated for UTI.  Patient is being referred for admission for hallucinations and the need for placement.  Hospital Course:  1. Persistent hallucinations with questionable acute on chronic encephalopathy likely secondary to polypharmacy-improving/stable at this time. Patient was recently hospitalized for UTI which was thought to be the cause and she refused skilled nursing facility placement at that time.  Patient completed antibiotic therapy as prescribed and there is no signs of acute infection currently.She unfortunately continued having persistence symptoms as an outpatient, which are believe to be related to her multiple narcotics, anxiolytic, and antipsychotic medications. She will more than likely require psychiatry evaluation in the outpatient setting, but is no meeting criteria for inpatient evaluation; especially now that she is denying any hallucination and mentation is once again oriented x3.  After discussing medication changes with patient she has requested to have her Paxil discontinued; will also adjust Seroquel to be given twice a day.  Continue to follow mentation status and orientation.   2. Necrotic changes noted to right fifth toe. Appreciate general surgery evaluation and recommendations.  ABI demonstrated normal ankle-brachial indices bilaterally.  There is no drainage out of the wounds or signs of superimposed infection.  Continue wound care for now.   3. Essential hypertension- much better controlled.  Continue current antihypertensive regimen.  Heart healthy diet has been encouraged. 4. History of rheumatoid arthritis.  Stable.  Continue current medications. 5. History of CVA. Continue aspirin for secondary prevention.  No new focal deficits appreciated. 6. Chronic  pain syndrome. Continue adjusted dose of narcotic medications and as needed muscle relaxants. 7. Bedbound status with need for  placement. PT evaluation appreciated with need for SNF/rehab.  Case manager/social worker informed and on board for assistance with discharge plans.  Procedures:  See below for x-ray reports.  Consultations:  General surgery  Discharge Exam: Vitals:   08/24/18 2204 08/25/18 0512  BP: (!) 193/95 (!) 153/96  Pulse: 77 74  Resp: 20 20  Temp: 98.6 F (37 C) 98.8 F (37.1 C)  SpO2: 100% 98%    General: Afebrile, no chest pain, no nausea, no vomiting.  Patient in no acute distress.  Denies acute hallucinations and is currently oriented x3. Cardiovascular: Clear to auscultation bilaterally.  Normal respiratory effort. Respiratory: Regular rate and rhythm.  No murmurs, no rubs, no gallops. Abdomen: Obese, nondistended, soft and nontender. Extremities: No cyanosis or clubbing. Skin: No petechiae; scarred lesion appreciated on the right fifth toe and dorsal lateral aspect of her fluid, there is no active drainage or concern for superimposed infection.  Clean dressings in place.  Discharge Instructions   Discharge Instructions    Diet - low sodium heart healthy   Complete by:  As directed    Discharge instructions   Complete by:  As directed    Take medications as prescribed Maintain adequate hydration Follow heart healthy diet Full [place] rehabilitation as per skilled nursing facility protocol.     Allergies as of 08/25/2018      Reactions   Demerol [meperidine] Anaphylaxis   Hydromorphone Anaphylaxis   Keflex [cephalexin] Nausea And Vomiting   Penicillins Anaphylaxis   Has patient had a PCN reaction causing immediate rash, facial/tongue/throat swelling, SOB or lightheadedness with hypotension: Yes Has patient had a PCN reaction causing severe rash involving mucus membranes or skin necrosis: No Has patient had a PCN reaction that required hospitalization No Has patient had a PCN reaction occurring within the last 10 years: No If all of the above answers are "NO", then may  proceed with Cephalosporin use.   Sulfa Antibiotics Nausea And Vomiting   Bisphosphonates    GI intolerance   Codeine    Fentanyl Other (See Comments)   "felt like I had demons in my head"   Iohexol    Percocet [oxycodone-acetaminophen] Swelling   Mouth swelling.   Shellfish Allergy    Glucosamine not an option   Terramycin [oxytetracycline] Nausea And Vomiting   Ciprofloxacin Nausea Only, Rash   Levaquin [levofloxacin In D5w] Nausea And Vomiting, Rash   Morphine And Related Rash      Medication List    STOP taking these medications   PARoxetine 10 MG tablet Commonly known as:  PAXIL   tiZANidine 4 MG tablet Commonly known as:  ZANAFLEX     TAKE these medications   acetaminophen 500 MG tablet Commonly known as:  TYLENOL Take 500 mg by mouth every 6 (six) hours as needed for mild pain or moderate pain.   ALPRAZolam 0.5 MG tablet Commonly known as:  XANAX Take 1 tablet (0.5 mg total) by mouth 3 (three) times daily as needed for anxiety. What changed:    when to take this  reasons to take this   busPIRone 15 MG tablet Commonly known as:  BUSPAR Take 15 mg by mouth 2 (two) times daily.   Cholecalciferol 125 MCG (5000 UT) Tabs Take 1 tablet by mouth daily.   clopidogrel 75 MG tablet Commonly known as:  PLAVIX Take 75 mg by mouth at  bedtime.   desloratadine 5 MG tablet Commonly known as:  CLARINEX Take 5 mg by mouth every morning.   dicyclomine 20 MG tablet Commonly known as:  BENTYL Take 20 mg by mouth every 6 (six) hours. Abdominal cramps   Ensure Max Protein Liqd Take 330 mLs (11 oz total) by mouth 2 (two) times daily.   HYDROcodone-acetaminophen 5-325 MG tablet Commonly known as:  NORCO/VICODIN Take 1 tablet by mouth every 6 (six) hours as needed for severe pain. What changed:  reasons to take this   hydroxychloroquine 200 MG tablet Commonly known as:  PLAQUENIL Take 200 mg by mouth daily.   lansoprazole 30 MG capsule Commonly known as:   PREVACID Take 30 mg by mouth 2 (two) times daily.   methocarbamol 750 MG tablet Commonly known as:  ROBAXIN Take 1 tablet (750 mg total) by mouth every 6 (six) hours as needed for muscle spasms. What changed:    when to take this  reasons to take this   metoprolol tartrate 25 MG tablet Commonly known as:  LOPRESSOR Take 1 tablet (25 mg total) by mouth 2 (two) times daily.   ondansetron 4 MG tablet Commonly known as:  ZOFRAN Take 4 mg by mouth every 6 (six) hours as needed for nausea or vomiting.   Propylene Glycol 0.6 % Soln Place 1 drop into both eyes daily. At noon.   QUEtiapine 25 MG tablet Commonly known as:  SEROQUEL Take 0.5 tablets (12.5 mg total) by mouth 2 (two) times daily. What changed:    how much to take  when to take this   Restasis 0.05 % ophthalmic emulsion Generic drug:  cycloSPORINE Place 1 drop into both eyes 2 (two) times daily.   sodium chloride 0.65 % Soln nasal spray Commonly known as:  OCEAN Place 1 spray into both nostrils as needed for congestion. What changed:  when to take this   triamcinolone cream 0.5 % Commonly known as:  KENALOG Apply topically 2 (two) times daily. Apply to affected area twice daily as directed (left ankle area)   trolamine salicylate 10 % cream Commonly known as:  ASPERCREME Apply 1 application topically as needed for muscle pain.      Allergies  Allergen Reactions  . Demerol [Meperidine] Anaphylaxis  . Hydromorphone Anaphylaxis  . Keflex [Cephalexin] Nausea And Vomiting  . Penicillins Anaphylaxis    Has patient had a PCN reaction causing immediate rash, facial/tongue/throat swelling, SOB or lightheadedness with hypotension: Yes Has patient had a PCN reaction causing severe rash involving mucus membranes or skin necrosis: No Has patient had a PCN reaction that required hospitalization No Has patient had a PCN reaction occurring within the last 10 years: No If all of the above answers are "NO", then may  proceed with Cephalosporin use.   . Sulfa Antibiotics Nausea And Vomiting  . Bisphosphonates     GI intolerance  . Codeine   . Fentanyl Other (See Comments)    "felt like I had demons in my head"  . Iohexol   . Percocet [Oxycodone-Acetaminophen] Swelling    Mouth swelling.  . Shellfish Allergy     Glucosamine not an option  . Terramycin [Oxytetracycline] Nausea And Vomiting  . Ciprofloxacin Nausea Only and Rash  . Levaquin [Levofloxacin In D5w] Nausea And Vomiting and Rash  . Morphine And Related Rash    Contact information for follow-up providers    Redmond School, MD. Schedule an appointment as soon as possible for a visit in 2 week(s).  Specialty:  Internal Medicine Why:  After discharge from the skilled nursing facility. Contact information: [address] [place] [address] [phone]            Contact information for after-discharge care    [facility] .   Service:  Skilled Nursing Contact information: [address] [place] [phone]                  The results of significant diagnostics from this hospitalization (including imaging, microbiology, ancillary and laboratory) are listed below for reference.    Significant Diagnostic Studies: Ct Abdomen Pelvis Wo Contrast  Result Date: 08/20/2018 CLINICAL DATA:  73 year old female with abdominal pain and distension. EXAM: CT ABDOMEN AND PELVIS WITHOUT CONTRAST TECHNIQUE: Multidetector CT imaging of the abdomen and pelvis was performed following the standard protocol without IV contrast. COMPARISON:  12/17/2015 CT FINDINGS: Please note that parenchymal abnormalities may be missed without intravenous contrast. Lower chest: No acute abnormality. Hepatobiliary: Hepatic steatosis noted without suspicious focal hepatic abnormality. The patient is status post cholecystectomy. No biliary dilatation. Pancreas: Atrophic without other  significant abnormality Spleen: Unremarkable Adrenals/Urinary Tract: The kidneys and adrenal glands are unremarkable except for small nonobstructing RIGHT renal calculi. No hydronephrosis. Increased density within the dependent bladder may represent small bladder calculi. Stomach/Bowel: Stomach is within normal limits. No evidence of bowel wall thickening, distention, or inflammatory changes. Vascular/Lymphatic: Aortic atherosclerosis. No enlarged abdominal or pelvic lymph nodes. Reproductive: Status post hysterectomy. No adnexal masses. Other: No ascites, pneumoperitoneum or focal collection. Musculoskeletal: No acute or significant osseous findings. Mild multilevel degenerative changes within the lumbar spine noted. IMPRESSION: 1. No acute abnormality 2. Hepatic steatosis 3. Nonobstructing RIGHT renal calculi 4.  Aortic Atherosclerosis (ICD10-I70.0). Electronically Signed   By: Margarette Canada M.D.   On: 08/20/2018 16:39   Ct Head Wo Contrast  Result Date: 08/20/2018 CLINICAL DATA:  73 year old female with altered mental status, recent UTI EXAM: CT HEAD WITHOUT CONTRAST TECHNIQUE: Contiguous axial images were obtained from the base of the skull through the vertex without intravenous contrast. COMPARISON:  Prior head CT 07/30/2018 FINDINGS: Brain: No evidence of acute infarction, hemorrhage, hydrocephalus, extra-axial collection or mass lesion/mass effect. Similar pattern of advanced central and cortical atrophy with ex vacuo dilatation of the lateral ventricles. Confluent periventricular white matter hypoattenuation remains nonspecific but most consistent with advanced chronic microvascular ischemic white matter change. Small lacunar infarcts in the left thalamus. Vascular: No hyperdense vessel or unexpected calcification. Skull: Normal. Negative for fracture or focal lesion. Sinuses/Orbits: Chronic right maxillary sinusitis. Other: None. IMPRESSION: 1. No acute intracranial abnormality. 2. 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 Electronically Signed   By: Jacqulynn Cadet M.D.   On: 08/20/2018 16:38   Ct Head Wo Contrast  Result Date: 07/30/2018 CLINICAL DATA:  Worsening confusion and disorientation over the past 2 days. EXAM: CT HEAD WITHOUT CONTRAST TECHNIQUE: Contiguous axial images were obtained from the base of the skull through the vertex without intravenous contrast. COMPARISON:  MR brain and CT head dated April 02, 2016. FINDINGS: Brain: No evidence of acute infarction, hemorrhage, hydrocephalus, extra-axial collection or mass lesion/mass effect. Stable moderate atrophy and chronic microvascular ischemic changes. Unchanged lacunar infarct in the left thalamus. Vascular: No hyperdense vessel or unexpected calcification. Skull: Negative for fracture or focal lesion. Sinuses/Orbits: No acute finding. Other: None. IMPRESSION: 1.  No acute intracranial abnormality. 2. Stable moderate atrophy and chronic microvascular  ischemic changes. Electronically Signed   By: Titus Dubin M.D.   On: 07/30/2018 17:55   US Arterial Abi (screening Lower Extremity)  Result Date: 08/23/2018 CLINICAL DATA:  Quadriplegia with necrotic right fifth toe. Ulcer on right foot dorsum. EXAM: NONINVASIVE PHYSIOLOGIC VASCULAR STUDY OF BILATERAL LOWER EXTREMITIES TECHNIQUE: Evaluation of both lower extremities were performed at rest, including calculation of ankle-brachial indices with single level Doppler, pressure and pulse volume recording. COMPARISON:  None. FINDINGS: Right ABI:  1.02 Left ABI:  0.97 Right Lower Extremity:  Normal arterial waveforms at the ankle. Left Lower Extremity:  Normal arterial waveforms at the ankle. IMPRESSION: Normal ankle-brachial indices bilaterally. Electronically Signed   By: Markus Daft M.D.   On: 08/23/2018 07:41   Dg Chest Portable 1 View  Result Date: 08/20/2018 CLINICAL DATA:  Altered mental status. Shortness of breath.  Hypertension. EXAM: PORTABLE CHEST 1 VIEW COMPARISON:  07/30/2018. FINDINGS: Cardiomegaly. No pulmonary venous congestion. No focal infiltrate. No pleural effusion or pneumothorax. IMPRESSION: 1.  Cardiomegaly.  No pulmonary venous congestion. 2.  No acute pulmonary disease. Electronically Signed   By: Marcello Moores  Register   On: 08/20/2018 13:14   Dg Chest Port 1 View  Result Date: 07/30/2018 CLINICAL DATA:  Two-day history of hallucinations. EXAM: PORTABLE CHEST 1 VIEW COMPARISON:  06/21/2014 FINDINGS: 1721 hours. Lordotic positioning. The cardio pericardial silhouette is enlarged. The lungs are clear without focal pneumonia, edema, pneumothorax or pleural effusion. Haziness in the lung apices likely related to position. The visualized bony structures of the thorax are intact. Telemetry leads overlie the chest. IMPRESSION: No acute cardiopulmonary findings. Electronically Signed   By: Misty Stanley M.D.   On: 07/30/2018 17:54   Labs: Basic Metabolic Panel: Recent Labs  Lab 08/20/18 1400 08/21/18 0621 08/22/18 0627 08/23/18 0519  NA 137 138 138 138  K 4.3 3.4* 4.1 3.8  CL 101 102 104 103  CO2 25 24 26 24   GLUCOSE 112* 107* 122* 116*  BUN 9 7* 6* 7*  CREATININE 0.82 0.73 0.70 0.78  CALCIUM 8.8* 8.8* 8.9 8.8*  MG  --   --  2.3  --    Liver Function Tests: Recent Labs  Lab 08/20/18 1400  AST 29  ALT 18  ALKPHOS 128*  BILITOT 0.6  PROT 7.6  ALBUMIN 3.8   CBC: Recent Labs  Lab 08/20/18 1400 08/21/18 0621 08/22/18 0627 08/23/18 0519  WBC 14.0* 11.1* 9.2 11.3*  NEUTROABS 9.7*  --   --   --   HGB 13.3 13.7 13.3 12.6  HCT 42.6 44.6 42.2 41.5  MCV 93.6 93.9 95.0 95.8  PLT 477* 463* 400 422*    Signed:  Barton Dubois MD.  [facility] 08/25/2018, 12:41 PM

## 2018-08-25 NOTE — Care Management Important Message (Signed)
Important Message  Patient Details  Name: Robin Blackburn MRN: [id number] Date of Birth: Nov 29, 1945   Medicare Important Message Given:  Yes    Tommy Medal 08/25/2018, 1:54 PM

## 2018-08-25 NOTE — Progress Notes (Signed)
Report called to [facility]; IV and external urinary catheter discontinued; clean gown on patient and personal belongings bag given to patient.

## 2018-08-25 NOTE — Care Management (Signed)
Patient has insurance authorization.  Attending and bedside RN updated.

## 2018-08-25 NOTE — Care Management (Signed)
Called EMS for transport to [facility].

## 2019-04-28 ENCOUNTER — Ambulatory Visit: Payer: Medicare Other | Admitting: Orthopaedic Surgery

## 2019-05-10 ENCOUNTER — Ambulatory Visit: Payer: Medicare Other | Admitting: Orthopaedic Surgery

## 2019-06-01 DIAGNOSIS — M1711 Unilateral primary osteoarthritis, right knee: Secondary | ICD-10-CM | POA: Insufficient documentation

## 2019-06-13 DIAGNOSIS — H6121 Impacted cerumen, right ear: Secondary | ICD-10-CM | POA: Diagnosis not present

## 2019-06-13 DIAGNOSIS — L03115 Cellulitis of right lower limb: Secondary | ICD-10-CM | POA: Diagnosis not present

## 2019-06-13 DIAGNOSIS — M159 Polyosteoarthritis, unspecified: Secondary | ICD-10-CM | POA: Diagnosis not present

## 2019-06-13 DIAGNOSIS — Z20828 Contact with and (suspected) exposure to other viral communicable diseases: Secondary | ICD-10-CM | POA: Diagnosis not present

## 2019-06-17 DIAGNOSIS — Z20828 Contact with and (suspected) exposure to other viral communicable diseases: Secondary | ICD-10-CM | POA: Diagnosis not present

## 2019-06-21 DIAGNOSIS — Z20828 Contact with and (suspected) exposure to other viral communicable diseases: Secondary | ICD-10-CM | POA: Diagnosis not present

## 2019-07-01 DIAGNOSIS — F329 Major depressive disorder, single episode, unspecified: Secondary | ICD-10-CM | POA: Diagnosis not present

## 2019-07-01 DIAGNOSIS — I10 Essential (primary) hypertension: Secondary | ICD-10-CM | POA: Diagnosis not present

## 2019-07-01 DIAGNOSIS — U071 COVID-19: Secondary | ICD-10-CM | POA: Diagnosis not present

## 2019-07-01 DIAGNOSIS — K219 Gastro-esophageal reflux disease without esophagitis: Secondary | ICD-10-CM | POA: Diagnosis not present

## 2019-07-29 DIAGNOSIS — D649 Anemia, unspecified: Secondary | ICD-10-CM | POA: Diagnosis not present

## 2019-07-29 DIAGNOSIS — I10 Essential (primary) hypertension: Secondary | ICD-10-CM | POA: Diagnosis not present

## 2019-08-01 DIAGNOSIS — D509 Iron deficiency anemia, unspecified: Secondary | ICD-10-CM | POA: Diagnosis not present

## 2019-08-01 DIAGNOSIS — N390 Urinary tract infection, site not specified: Secondary | ICD-10-CM | POA: Diagnosis not present

## 2019-08-01 DIAGNOSIS — E119 Type 2 diabetes mellitus without complications: Secondary | ICD-10-CM | POA: Diagnosis not present

## 2019-08-01 DIAGNOSIS — E785 Hyperlipidemia, unspecified: Secondary | ICD-10-CM | POA: Diagnosis not present

## 2019-08-01 DIAGNOSIS — Z79899 Other long term (current) drug therapy: Secondary | ICD-10-CM | POA: Diagnosis not present

## 2019-08-01 DIAGNOSIS — E039 Hypothyroidism, unspecified: Secondary | ICD-10-CM | POA: Diagnosis not present

## 2019-08-01 DIAGNOSIS — E559 Vitamin D deficiency, unspecified: Secondary | ICD-10-CM | POA: Diagnosis not present

## 2019-08-01 DIAGNOSIS — R319 Hematuria, unspecified: Secondary | ICD-10-CM | POA: Diagnosis not present

## 2019-08-01 DIAGNOSIS — D649 Anemia, unspecified: Secondary | ICD-10-CM | POA: Diagnosis not present

## 2019-08-01 DIAGNOSIS — N184 Chronic kidney disease, stage 4 (severe): Secondary | ICD-10-CM | POA: Diagnosis not present

## 2019-08-04 DIAGNOSIS — R110 Nausea: Secondary | ICD-10-CM | POA: Diagnosis not present

## 2019-08-04 DIAGNOSIS — F411 Generalized anxiety disorder: Secondary | ICD-10-CM | POA: Diagnosis not present

## 2019-08-04 DIAGNOSIS — F331 Major depressive disorder, recurrent, moderate: Secondary | ICD-10-CM | POA: Diagnosis not present

## 2019-08-04 DIAGNOSIS — N390 Urinary tract infection, site not specified: Secondary | ICD-10-CM | POA: Diagnosis not present

## 2019-08-16 DIAGNOSIS — F419 Anxiety disorder, unspecified: Secondary | ICD-10-CM | POA: Diagnosis not present

## 2019-08-19 DIAGNOSIS — N390 Urinary tract infection, site not specified: Secondary | ICD-10-CM | POA: Diagnosis not present

## 2019-08-19 DIAGNOSIS — R300 Dysuria: Secondary | ICD-10-CM | POA: Diagnosis not present

## 2019-08-19 DIAGNOSIS — R319 Hematuria, unspecified: Secondary | ICD-10-CM | POA: Diagnosis not present

## 2019-08-22 DIAGNOSIS — R319 Hematuria, unspecified: Secondary | ICD-10-CM | POA: Diagnosis not present

## 2019-08-22 DIAGNOSIS — N390 Urinary tract infection, site not specified: Secondary | ICD-10-CM | POA: Diagnosis not present

## 2019-08-23 DIAGNOSIS — N390 Urinary tract infection, site not specified: Secondary | ICD-10-CM | POA: Diagnosis not present

## 2019-08-30 DIAGNOSIS — F4321 Adjustment disorder with depressed mood: Secondary | ICD-10-CM | POA: Diagnosis not present

## 2019-08-30 DIAGNOSIS — F411 Generalized anxiety disorder: Secondary | ICD-10-CM | POA: Diagnosis not present

## 2019-09-01 DIAGNOSIS — M1711 Unilateral primary osteoarthritis, right knee: Secondary | ICD-10-CM | POA: Diagnosis not present

## 2019-09-01 DIAGNOSIS — M179 Osteoarthritis of knee, unspecified: Secondary | ICD-10-CM | POA: Diagnosis not present

## 2019-09-02 DIAGNOSIS — Z79899 Other long term (current) drug therapy: Secondary | ICD-10-CM | POA: Diagnosis not present

## 2019-09-02 DIAGNOSIS — F419 Anxiety disorder, unspecified: Secondary | ICD-10-CM | POA: Diagnosis not present

## 2019-09-05 DIAGNOSIS — N390 Urinary tract infection, site not specified: Secondary | ICD-10-CM | POA: Diagnosis not present

## 2019-09-05 DIAGNOSIS — R319 Hematuria, unspecified: Secondary | ICD-10-CM | POA: Diagnosis not present

## 2019-09-05 DIAGNOSIS — R300 Dysuria: Secondary | ICD-10-CM | POA: Diagnosis not present

## 2019-09-06 DIAGNOSIS — M797 Fibromyalgia: Secondary | ICD-10-CM | POA: Diagnosis not present

## 2019-09-06 DIAGNOSIS — F419 Anxiety disorder, unspecified: Secondary | ICD-10-CM | POA: Diagnosis not present

## 2019-09-06 DIAGNOSIS — I10 Essential (primary) hypertension: Secondary | ICD-10-CM | POA: Diagnosis not present

## 2019-09-08 DIAGNOSIS — Z79899 Other long term (current) drug therapy: Secondary | ICD-10-CM | POA: Diagnosis not present

## 2019-09-08 DIAGNOSIS — R300 Dysuria: Secondary | ICD-10-CM | POA: Diagnosis not present

## 2019-09-08 DIAGNOSIS — N390 Urinary tract infection, site not specified: Secondary | ICD-10-CM | POA: Diagnosis not present

## 2019-09-08 DIAGNOSIS — R319 Hematuria, unspecified: Secondary | ICD-10-CM | POA: Diagnosis not present

## 2019-09-08 DIAGNOSIS — M1712 Unilateral primary osteoarthritis, left knee: Secondary | ICD-10-CM | POA: Diagnosis not present

## 2019-09-13 DIAGNOSIS — B9620 Unspecified Escherichia coli [E. coli] as the cause of diseases classified elsewhere: Secondary | ICD-10-CM | POA: Diagnosis not present

## 2019-09-13 DIAGNOSIS — B372 Candidiasis of skin and nail: Secondary | ICD-10-CM | POA: Diagnosis not present

## 2019-09-13 DIAGNOSIS — N390 Urinary tract infection, site not specified: Secondary | ICD-10-CM | POA: Diagnosis not present

## 2019-09-15 DIAGNOSIS — M1711 Unilateral primary osteoarthritis, right knee: Secondary | ICD-10-CM | POA: Diagnosis not present

## 2019-09-16 DIAGNOSIS — B9620 Unspecified Escherichia coli [E. coli] as the cause of diseases classified elsewhere: Secondary | ICD-10-CM | POA: Diagnosis not present

## 2019-09-16 DIAGNOSIS — N390 Urinary tract infection, site not specified: Secondary | ICD-10-CM | POA: Diagnosis not present

## 2019-09-16 DIAGNOSIS — B373 Candidiasis of vulva and vagina: Secondary | ICD-10-CM | POA: Diagnosis not present

## 2019-09-20 DIAGNOSIS — F4321 Adjustment disorder with depressed mood: Secondary | ICD-10-CM | POA: Diagnosis not present

## 2019-09-20 DIAGNOSIS — F411 Generalized anxiety disorder: Secondary | ICD-10-CM | POA: Diagnosis not present

## 2019-10-14 DIAGNOSIS — M797 Fibromyalgia: Secondary | ICD-10-CM | POA: Diagnosis not present

## 2019-10-14 DIAGNOSIS — M069 Rheumatoid arthritis, unspecified: Secondary | ICD-10-CM | POA: Diagnosis not present

## 2019-10-14 DIAGNOSIS — F419 Anxiety disorder, unspecified: Secondary | ICD-10-CM | POA: Diagnosis not present

## 2019-10-18 DIAGNOSIS — F4321 Adjustment disorder with depressed mood: Secondary | ICD-10-CM | POA: Diagnosis not present

## 2019-10-18 DIAGNOSIS — F411 Generalized anxiety disorder: Secondary | ICD-10-CM | POA: Diagnosis not present

## 2019-10-19 DIAGNOSIS — D649 Anemia, unspecified: Secondary | ICD-10-CM | POA: Diagnosis not present

## 2019-10-19 DIAGNOSIS — N186 End stage renal disease: Secondary | ICD-10-CM | POA: Diagnosis not present

## 2019-10-20 DIAGNOSIS — M069 Rheumatoid arthritis, unspecified: Secondary | ICD-10-CM | POA: Diagnosis not present

## 2019-10-20 DIAGNOSIS — I10 Essential (primary) hypertension: Secondary | ICD-10-CM | POA: Diagnosis not present

## 2019-10-20 DIAGNOSIS — F329 Major depressive disorder, single episode, unspecified: Secondary | ICD-10-CM | POA: Diagnosis not present

## 2019-10-20 DIAGNOSIS — K219 Gastro-esophageal reflux disease without esophagitis: Secondary | ICD-10-CM | POA: Diagnosis not present

## 2019-10-20 DIAGNOSIS — M79671 Pain in right foot: Secondary | ICD-10-CM | POA: Diagnosis not present

## 2019-10-20 DIAGNOSIS — M79672 Pain in left foot: Secondary | ICD-10-CM | POA: Diagnosis not present

## 2019-10-27 DIAGNOSIS — M1711 Unilateral primary osteoarthritis, right knee: Secondary | ICD-10-CM | POA: Diagnosis not present

## 2019-10-27 DIAGNOSIS — M79641 Pain in right hand: Secondary | ICD-10-CM | POA: Diagnosis not present

## 2019-11-01 DIAGNOSIS — F411 Generalized anxiety disorder: Secondary | ICD-10-CM | POA: Diagnosis not present

## 2019-11-01 DIAGNOSIS — F4321 Adjustment disorder with depressed mood: Secondary | ICD-10-CM | POA: Diagnosis not present

## 2019-11-03 DIAGNOSIS — F331 Major depressive disorder, recurrent, moderate: Secondary | ICD-10-CM | POA: Diagnosis not present

## 2019-11-03 DIAGNOSIS — F411 Generalized anxiety disorder: Secondary | ICD-10-CM | POA: Diagnosis not present

## 2019-11-14 DIAGNOSIS — R197 Diarrhea, unspecified: Secondary | ICD-10-CM | POA: Diagnosis not present

## 2019-11-14 DIAGNOSIS — K219 Gastro-esophageal reflux disease without esophagitis: Secondary | ICD-10-CM | POA: Diagnosis not present

## 2019-11-14 DIAGNOSIS — K589 Irritable bowel syndrome without diarrhea: Secondary | ICD-10-CM | POA: Diagnosis not present

## 2019-11-14 DIAGNOSIS — R109 Unspecified abdominal pain: Secondary | ICD-10-CM | POA: Diagnosis not present

## 2019-11-16 ENCOUNTER — Other Ambulatory Visit: Payer: Self-pay | Admitting: *Deleted

## 2019-11-16 DIAGNOSIS — M25562 Pain in left knee: Secondary | ICD-10-CM

## 2019-11-17 DIAGNOSIS — R197 Diarrhea, unspecified: Secondary | ICD-10-CM | POA: Diagnosis not present

## 2019-11-17 DIAGNOSIS — F419 Anxiety disorder, unspecified: Secondary | ICD-10-CM | POA: Diagnosis not present

## 2019-11-17 DIAGNOSIS — R109 Unspecified abdominal pain: Secondary | ICD-10-CM | POA: Diagnosis not present

## 2019-11-17 DIAGNOSIS — K219 Gastro-esophageal reflux disease without esophagitis: Secondary | ICD-10-CM | POA: Diagnosis not present

## 2019-11-17 DIAGNOSIS — K589 Irritable bowel syndrome without diarrhea: Secondary | ICD-10-CM | POA: Diagnosis not present

## 2019-11-17 DIAGNOSIS — I10 Essential (primary) hypertension: Secondary | ICD-10-CM | POA: Diagnosis not present

## 2019-11-18 ENCOUNTER — Encounter: Payer: Medicare Other | Admitting: Vascular Surgery

## 2019-11-18 ENCOUNTER — Encounter (HOSPITAL_COMMUNITY): Payer: Medicare Other

## 2019-11-21 ENCOUNTER — Other Ambulatory Visit: Payer: Self-pay | Admitting: Internal Medicine

## 2019-11-21 ENCOUNTER — Other Ambulatory Visit (HOSPITAL_COMMUNITY): Payer: Self-pay | Admitting: Internal Medicine

## 2019-11-22 ENCOUNTER — Other Ambulatory Visit (HOSPITAL_COMMUNITY): Payer: Self-pay | Admitting: Internal Medicine

## 2019-11-22 ENCOUNTER — Other Ambulatory Visit: Payer: Self-pay | Admitting: Internal Medicine

## 2019-11-22 ENCOUNTER — Other Ambulatory Visit: Payer: Self-pay | Admitting: Family

## 2019-11-22 ENCOUNTER — Other Ambulatory Visit: Payer: Self-pay

## 2019-11-22 DIAGNOSIS — R1084 Generalized abdominal pain: Secondary | ICD-10-CM

## 2019-11-28 DIAGNOSIS — J302 Other seasonal allergic rhinitis: Secondary | ICD-10-CM | POA: Diagnosis not present

## 2019-11-28 DIAGNOSIS — F419 Anxiety disorder, unspecified: Secondary | ICD-10-CM | POA: Diagnosis not present

## 2019-11-28 DIAGNOSIS — R238 Other skin changes: Secondary | ICD-10-CM | POA: Diagnosis not present

## 2019-11-28 DIAGNOSIS — K219 Gastro-esophageal reflux disease without esophagitis: Secondary | ICD-10-CM | POA: Diagnosis not present

## 2019-12-01 ENCOUNTER — Ambulatory Visit (HOSPITAL_COMMUNITY): Admission: RE | Admit: 2019-12-01 | Payer: Medicare PPO | Source: Ambulatory Visit

## 2019-12-07 ENCOUNTER — Telehealth: Payer: Self-pay | Admitting: Family

## 2019-12-09 ENCOUNTER — Encounter (HOSPITAL_COMMUNITY): Payer: Self-pay

## 2019-12-09 ENCOUNTER — Ambulatory Visit (HOSPITAL_COMMUNITY): Payer: Medicare PPO

## 2019-12-21 DIAGNOSIS — M79675 Pain in left toe(s): Secondary | ICD-10-CM | POA: Diagnosis not present

## 2019-12-21 DIAGNOSIS — B351 Tinea unguium: Secondary | ICD-10-CM | POA: Diagnosis not present

## 2019-12-21 DIAGNOSIS — M79674 Pain in right toe(s): Secondary | ICD-10-CM | POA: Diagnosis not present

## 2019-12-27 DIAGNOSIS — F411 Generalized anxiety disorder: Secondary | ICD-10-CM | POA: Diagnosis not present

## 2019-12-27 DIAGNOSIS — F4321 Adjustment disorder with depressed mood: Secondary | ICD-10-CM | POA: Diagnosis not present

## 2020-01-03 DIAGNOSIS — F411 Generalized anxiety disorder: Secondary | ICD-10-CM | POA: Diagnosis not present

## 2020-01-03 DIAGNOSIS — F4321 Adjustment disorder with depressed mood: Secondary | ICD-10-CM | POA: Diagnosis not present

## 2020-01-03 DIAGNOSIS — K219 Gastro-esophageal reflux disease without esophagitis: Secondary | ICD-10-CM | POA: Diagnosis not present

## 2020-01-03 DIAGNOSIS — M069 Rheumatoid arthritis, unspecified: Secondary | ICD-10-CM | POA: Diagnosis not present

## 2020-01-03 DIAGNOSIS — F419 Anxiety disorder, unspecified: Secondary | ICD-10-CM | POA: Diagnosis not present

## 2020-01-04 DIAGNOSIS — D649 Anemia, unspecified: Secondary | ICD-10-CM | POA: Diagnosis not present

## 2020-01-04 DIAGNOSIS — D509 Iron deficiency anemia, unspecified: Secondary | ICD-10-CM | POA: Diagnosis not present

## 2020-01-04 DIAGNOSIS — N184 Chronic kidney disease, stage 4 (severe): Secondary | ICD-10-CM | POA: Diagnosis not present

## 2020-01-05 DIAGNOSIS — Z79899 Other long term (current) drug therapy: Secondary | ICD-10-CM | POA: Diagnosis not present

## 2020-01-05 DIAGNOSIS — R197 Diarrhea, unspecified: Secondary | ICD-10-CM | POA: Diagnosis not present

## 2020-01-05 DIAGNOSIS — R109 Unspecified abdominal pain: Secondary | ICD-10-CM | POA: Diagnosis not present

## 2020-01-05 DIAGNOSIS — F419 Anxiety disorder, unspecified: Secondary | ICD-10-CM | POA: Diagnosis not present

## 2020-01-05 DIAGNOSIS — Z8673 Personal history of transient ischemic attack (TIA), and cerebral infarction without residual deficits: Secondary | ICD-10-CM | POA: Diagnosis not present

## 2020-01-06 ENCOUNTER — Ambulatory Visit (HOSPITAL_COMMUNITY)
Admission: RE | Admit: 2020-01-06 | Discharge: 2020-01-06 | Disposition: A | Discharge status: Home / Self Care | Payer: Medicare PPO | Source: Ambulatory Visit | Attending: Vascular Surgery | Admitting: Vascular Surgery

## 2020-01-06 ENCOUNTER — Ambulatory Visit (INDEPENDENT_AMBULATORY_CARE_PROVIDER_SITE_OTHER): Payer: Medicare PPO | Admitting: Vascular Surgery

## 2020-01-06 ENCOUNTER — Other Ambulatory Visit: Payer: Self-pay

## 2020-01-06 ENCOUNTER — Encounter: Payer: Self-pay | Admitting: Vascular Surgery

## 2020-01-06 VITALS — BP 148/89 | HR 58 | Temp 97.2°F | Resp 16 | Ht 66.0 in | Wt 182.0 lb

## 2020-01-06 DIAGNOSIS — M25562 Pain in left knee: Secondary | ICD-10-CM | POA: Diagnosis not present

## 2020-01-06 DIAGNOSIS — M25561 Pain in right knee: Secondary | ICD-10-CM
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
## 2020-01-13 ENCOUNTER — Encounter: Payer: Self-pay | Admitting: *Deleted

## 2020-01-13 ENCOUNTER — Ambulatory Visit: Payer: Medicare PPO | Admitting: Gastroenterology

## 2020-01-13 NOTE — Progress Notes (Deleted)
Primary Care Physician:  Redmond School, MD  Primary Gastroenterologist:    No chief complaint on file.   HPI:  Robin Blackburn is a 74 y.o. female here   Current Outpatient Medications  Medication Sig Dispense Refill   acetaminophen (TYLENOL) 500 MG tablet Take 500 mg by mouth every 6 (six) hours as needed for mild pain or moderate pain.     ALPRAZolam (XANAX) 0.5 MG tablet Take 1 tablet (0.5 mg total) by mouth 3 (three) times daily as needed for anxiety. 15 tablet 0   busPIRone (BUSPAR) 15 MG tablet Take 15 mg by mouth 2 (two) times daily.      Cholecalciferol 5000 units TABS Take 1 tablet by mouth daily.      clopidogrel (PLAVIX) 75 MG tablet Take 75 mg by mouth at bedtime.     desloratadine (CLARINEX) 5 MG tablet Take 5 mg by mouth every morning.      dicyclomine (BENTYL) 20 MG tablet Take 20 mg by mouth every 6 (six) hours. Abdominal cramps     Ensure Max Protein (ENSURE MAX PROTEIN) LIQD Take 330 mLs (11 oz total) by mouth 2 (two) times daily.     HYDROcodone-acetaminophen (NORCO/VICODIN) 5-325 MG tablet Take 1 tablet by mouth every 6 (six) hours as needed for severe pain. (Patient not taking: Reported on 01/06/2020) 20 tablet 0   hydroxychloroquine (PLAQUENIL) 200 MG tablet Take 200 mg by mouth daily.      lansoprazole (PREVACID) 30 MG capsule Take 30 mg by mouth 2 (two) times daily.     methocarbamol (ROBAXIN) 750 MG tablet Take 1 tablet (750 mg total) by mouth every 6 (six) hours as needed for muscle spasms.     metoprolol tartrate (LOPRESSOR) 25 MG tablet Take 1 tablet (25 mg total) by mouth 2 (two) times daily. 60 tablet 2   ondansetron (ZOFRAN) 4 MG tablet Take 4 mg by mouth every 6 (six) hours as needed for nausea or vomiting.      Propylene Glycol 0.6 % SOLN Place 1 drop into both eyes daily. At noon.     QUEtiapine (SEROQUEL) 25 MG tablet Take 0.5 tablets (12.5 mg total) by mouth 2 (two) times daily.     RESTASIS 0.05 % ophthalmic emulsion Place 1 drop into  both eyes 2 (two) times daily.     sodium chloride (OCEAN) 0.65 % SOLN nasal spray Place 1 spray into both nostrils as needed for congestion. (Patient taking differently: Place 1 spray into both nostrils daily as needed for congestion. )     triamcinolone cream (KENALOG) 0.5 % Apply topically 2 (two) times daily. Apply to affected area twice daily as directed (left ankle area) 30 g 0   trolamine salicylate (ASPERCREME) 10 % cream Apply 1 application topically as needed for muscle pain.     No current facility-administered medications for this visit.    Allergies as of 01/13/2020 - Review Complete 01/06/2020  Allergen Reaction Noted   Demerol [meperidine] Anaphylaxis 02/19/2014   Hydromorphone Anaphylaxis 02/19/2014   Keflex [cephalexin] Nausea And Vomiting 11/28/2016   Penicillins Anaphylaxis 02/19/2014   Sulfa antibiotics Nausea And Vomiting 02/19/2014   Bisphosphonates  11/01/2015   Codeine  08/20/2018   Fentanyl Other (See Comments) 12/17/2015   Iohexol  08/14/2004   Percocet [oxycodone-acetaminophen] Swelling 02/19/2014   Shellfish allergy  11/01/2015   Terramycin [oxytetracycline] Nausea And Vomiting 02/19/2014   Ciprofloxacin Nausea Only and Rash 02/19/2014   Levaquin [levofloxacin in d5w] Nausea And Vomiting and Rash  02/19/2014   Morphine and related Rash 02/19/2014    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   Depression    Fibromyalgia    Hypertension    Neuropathy    Rheumatoid arthritis ([place])    Stroke ([place]) 02/17/14    Past Surgical History:  Procedure Laterality Date   ABDOMINAL HYSTERECTOMY     ANKLE RECONSTRUCTION     APPENDECTOMY     BACK SURGERY     CHOLECYSTECTOMY     KNEE SURGERY      Family History  Problem Relation Age of Onset   Hypertension Father    Transient ischemic attack Father    Hypertension Brother    CVA Maternal  Grandfather    Leukemia Brother     Social History   Socioeconomic History   Marital status: Divorced    Spouse name: Not on file   Number of children: Not on file   Years of education: Not on file   Highest education level: Not on file  Occupational History   Not on file  Tobacco Use   Smoking status: Never Smoker   Smokeless tobacco: Never Used  Vaping Use   Vaping Use: Never used  Substance and Sexual Activity   Alcohol use: No   Drug use: No   Sexual activity: Not on file  Other Topics Concern   Not on file  Social History Narrative   Not on file   Social Determinants of Health   Financial Resource Strain:    Difficulty of Paying Living Expenses:   Food Insecurity:    Worried About Charity fundraiser in the Last Year:    Arboriculturist in the Last Year:   Transportation Needs:    Film/video editor (Medical):    Lack of Transportation (Non-Medical):   Physical Activity:    Days of Exercise per Week:    Minutes of Exercise per Session:   Stress:    Feeling of Stress :   Social Connections:    Frequency of Communication with Friends and Family:    Frequency of Social Gatherings with Friends and Family:    Attends Religious Services:    Active Member of Clubs or Organizations:    Attends Music therapist:    Marital Status:   Intimate Partner Violence:    Fear of Current or Ex-Partner:    Emotionally Abused:    Physically Abused:    Sexually Abused:       ROS:  General: Negative for anorexia, weight loss, fever, chills, fatigue, weakness. Eyes: Negative for vision changes.  ENT: Negative for hoarseness, difficulty swallowing , nasal congestion. CV: Negative for chest pain, angina, palpitations, dyspnea on exertion, peripheral edema.  Respiratory: Negative for dyspnea at rest, dyspnea on exertion, cough, sputum, wheezing.  GI: See history of present illness. GU:  Negative for dysuria, hematuria,  urinary incontinence, urinary frequency, nocturnal urination.  MS: Negative for joint pain, low back pain.  Derm: Negative for rash or itching.  Neuro: Negative for weakness, abnormal sensation, seizure, frequent headaches, memory loss, confusion.  Psych: Negative for anxiety, depression, suicidal ideation, hallucinations.  Endo: Negative for unusual weight change.  Heme: Negative for bruising or bleeding. Allergy: Negative for rash or hives.    Physical Examination:  There were no vitals taken for this visit.   General: Well-nourished, well-developed in no acute  distress.  Head: Normocephalic, atraumatic.   Eyes: Conjunctiva pink, no icterus. Mouth: Oropharyngeal mucosa moist and pink , no lesions erythema or exudate. Neck: Supple without thyromegaly, masses, or lymphadenopathy.  Lungs: Clear to auscultation bilaterally.  Heart: Regular rate and rhythm, no murmurs rubs or gallops.  Abdomen: Bowel sounds are normal, nontender, nondistended, no hepatosplenomegaly or masses, no abdominal bruits or    hernia , no rebound or guarding.   Rectal: *** Extremities: No lower extremity edema. No clubbing or deformities.  Neuro: Alert and oriented x 4 , grossly normal neurologically.  Skin: Warm and dry, no rash or jaundice.   Psych: Alert and cooperative, normal mood and affect.  Labs: ***  Imaging Studies: VAS [place] ABI WITH/WO TBI  Result Date: 01/06/2020 LOWER EXTREMITY DOPPLER STUDY Indications: Patient complains of bilateral foot pain. High Risk Factors: Hypertension.  Limitations: Patient in wheelchair, Sarah 3000 lift machine used to transfer              patient to bed. Performing Technologist: Ronal Fear RVS, RCS  Examination Guidelines: A complete evaluation includes at minimum, Doppler waveform signals and systolic blood pressure reading at the level of bilateral brachial, anterior tibial, and posterior tibial arteries, when vessel segments are accessible. Bilateral testing is  considered an integral part of a complete examination. Photoelectric Plethysmograph (PPG) waveforms and toe systolic pressure readings are included as required and additional duplex testing as needed. Limited examinations for reoccurring indications may be performed as noted.  ABI Findings: +---------+------------------+-----+---------+--------+  Right     Rt Pressure (mmHg) Index Waveform  Comment   +---------+------------------+-----+---------+--------+  Brachial  145                                          +---------+------------------+-----+---------+--------+  PTA       155                1.07  triphasic           +---------+------------------+-----+---------+--------+  DP        171                1.18  triphasic           +---------+------------------+-----+---------+--------+  Great Toe 121                0.83                      +---------+------------------+-----+---------+--------+ +---------+------------------+-----+---------+-------+  Left      Lt Pressure (mmHg) Index Waveform  Comment  +---------+------------------+-----+---------+-------+  Brachial  143                                         +---------+------------------+-----+---------+-------+  PTA       155                1.07  triphasic          +---------+------------------+-----+---------+-------+  DP        171                1.18  triphasic          +---------+------------------+-----+---------+-------+  Great Toe 114                0.79                     +---------+------------------+-----+---------+-------+ +-------+-----------+-----------+------------+------------+  ABI/TBI Today's ABI Today's TBI Previous ABI Previous TBI  +-------+-----------+-----------+------------+------------+  Right   1.18        0.83                                   +-------+-----------+-----------+------------+------------+  Left    1.18        0.79                                   +-------+-----------+-----------+------------+------------+   Summary:  Right: Resting right ankle-brachial index is within normal range. No evidence of significant right lower extremity arterial disease. The right toe-brachial index is normal. Left: Resting left ankle-brachial index is within normal range. No evidence of significant left lower extremity arterial disease. The left toe-brachial index is normal.  *See table(s) above for measurements and observations.  Electronically signed by Servando Snare MD on 01/06/2020 at 9:56:58 AM.    Final

## 2020-01-17 DIAGNOSIS — F411 Generalized anxiety disorder: Secondary | ICD-10-CM | POA: Diagnosis not present

## 2020-01-17 DIAGNOSIS — F4321 Adjustment disorder with depressed mood: Secondary | ICD-10-CM | POA: Diagnosis not present

## 2020-01-20 DIAGNOSIS — F331 Major depressive disorder, recurrent, moderate: Secondary | ICD-10-CM | POA: Diagnosis not present

## 2020-01-20 DIAGNOSIS — F411 Generalized anxiety disorder: Secondary | ICD-10-CM | POA: Diagnosis not present

## 2020-01-31 DIAGNOSIS — F411 Generalized anxiety disorder: Secondary | ICD-10-CM | POA: Diagnosis not present

## 2020-01-31 DIAGNOSIS — F4321 Adjustment disorder with depressed mood: Secondary | ICD-10-CM | POA: Diagnosis not present

## 2020-02-02 DIAGNOSIS — R309 Painful micturition, unspecified: Secondary | ICD-10-CM | POA: Diagnosis not present

## 2020-02-02 DIAGNOSIS — M159 Polyosteoarthritis, unspecified: Secondary | ICD-10-CM | POA: Diagnosis not present

## 2020-02-02 DIAGNOSIS — F329 Major depressive disorder, single episode, unspecified: Secondary | ICD-10-CM | POA: Diagnosis not present

## 2020-02-02 DIAGNOSIS — I679 Cerebrovascular disease, unspecified: Secondary | ICD-10-CM | POA: Diagnosis not present

## 2020-02-02 DIAGNOSIS — K589 Irritable bowel syndrome without diarrhea: Secondary | ICD-10-CM | POA: Diagnosis not present

## 2020-02-02 DIAGNOSIS — K219 Gastro-esophageal reflux disease without esophagitis: Secondary | ICD-10-CM | POA: Diagnosis not present

## 2020-02-02 DIAGNOSIS — M069 Rheumatoid arthritis, unspecified: Secondary | ICD-10-CM | POA: Diagnosis not present

## 2020-02-02 DIAGNOSIS — I10 Essential (primary) hypertension: Secondary | ICD-10-CM | POA: Diagnosis not present

## 2020-02-06 DIAGNOSIS — N390 Urinary tract infection, site not specified: Secondary | ICD-10-CM | POA: Diagnosis not present

## 2020-02-09 DIAGNOSIS — K219 Gastro-esophageal reflux disease without esophagitis: Secondary | ICD-10-CM | POA: Diagnosis not present

## 2020-02-09 DIAGNOSIS — G2581 Restless legs syndrome: Secondary | ICD-10-CM | POA: Diagnosis not present

## 2020-02-09 DIAGNOSIS — F419 Anxiety disorder, unspecified: Secondary | ICD-10-CM | POA: Diagnosis not present

## 2020-02-09 DIAGNOSIS — I679 Cerebrovascular disease, unspecified: Secondary | ICD-10-CM | POA: Diagnosis not present

## 2020-02-09 DIAGNOSIS — N390 Urinary tract infection, site not specified: Secondary | ICD-10-CM | POA: Diagnosis not present

## 2020-02-09 DIAGNOSIS — M069 Rheumatoid arthritis, unspecified: Secondary | ICD-10-CM | POA: Diagnosis not present

## 2020-02-09 DIAGNOSIS — I10 Essential (primary) hypertension: Secondary | ICD-10-CM | POA: Diagnosis not present

## 2020-02-09 DIAGNOSIS — F329 Major depressive disorder, single episode, unspecified: Secondary | ICD-10-CM | POA: Diagnosis not present

## 2020-02-16 DIAGNOSIS — M25531 Pain in right wrist: Secondary | ICD-10-CM | POA: Diagnosis not present

## 2020-02-16 DIAGNOSIS — M19041 Primary osteoarthritis, right hand: Secondary | ICD-10-CM | POA: Diagnosis not present

## 2020-02-16 DIAGNOSIS — M79641 Pain in right hand: Secondary | ICD-10-CM | POA: Diagnosis not present

## 2020-02-16 DIAGNOSIS — F329 Major depressive disorder, single episode, unspecified: Secondary | ICD-10-CM | POA: Diagnosis not present

## 2020-02-16 DIAGNOSIS — I679 Cerebrovascular disease, unspecified: Secondary | ICD-10-CM | POA: Diagnosis not present

## 2020-02-16 DIAGNOSIS — M19031 Primary osteoarthritis, right wrist: Secondary | ICD-10-CM | POA: Diagnosis not present

## 2020-02-16 DIAGNOSIS — K219 Gastro-esophageal reflux disease without esophagitis: Secondary | ICD-10-CM | POA: Diagnosis not present

## 2020-02-16 DIAGNOSIS — I10 Essential (primary) hypertension: Secondary | ICD-10-CM | POA: Diagnosis not present

## 2020-02-16 DIAGNOSIS — M069 Rheumatoid arthritis, unspecified: Secondary | ICD-10-CM | POA: Diagnosis not present

## 2020-02-16 DIAGNOSIS — F419 Anxiety disorder, unspecified: Secondary | ICD-10-CM | POA: Diagnosis not present

## 2020-02-23 DIAGNOSIS — F411 Generalized anxiety disorder: Secondary | ICD-10-CM | POA: Diagnosis not present

## 2020-02-27 DIAGNOSIS — M25511 Pain in right shoulder: Secondary | ICD-10-CM | POA: Diagnosis not present

## 2020-02-27 DIAGNOSIS — G894 Chronic pain syndrome: Secondary | ICD-10-CM | POA: Diagnosis not present

## 2020-02-27 DIAGNOSIS — M069 Rheumatoid arthritis, unspecified: Secondary | ICD-10-CM | POA: Diagnosis not present

## 2020-02-27 DIAGNOSIS — M6281 Muscle weakness (generalized): Secondary | ICD-10-CM | POA: Diagnosis not present

## 2020-02-27 DIAGNOSIS — M797 Fibromyalgia: Secondary | ICD-10-CM | POA: Diagnosis not present

## 2020-02-28 DIAGNOSIS — H524 Presbyopia: Secondary | ICD-10-CM | POA: Diagnosis not present

## 2020-02-28 DIAGNOSIS — H2513 Age-related nuclear cataract, bilateral: Secondary | ICD-10-CM | POA: Diagnosis not present

## 2020-02-29 DIAGNOSIS — M6281 Muscle weakness (generalized): Secondary | ICD-10-CM | POA: Diagnosis not present

## 2020-02-29 DIAGNOSIS — M797 Fibromyalgia: Secondary | ICD-10-CM | POA: Diagnosis not present

## 2020-02-29 DIAGNOSIS — M25511 Pain in right shoulder: Secondary | ICD-10-CM | POA: Diagnosis not present

## 2020-02-29 DIAGNOSIS — G894 Chronic pain syndrome: Secondary | ICD-10-CM | POA: Diagnosis not present

## 2020-02-29 DIAGNOSIS — M069 Rheumatoid arthritis, unspecified: Secondary | ICD-10-CM | POA: Diagnosis not present

## 2020-03-01 DIAGNOSIS — G894 Chronic pain syndrome: Secondary | ICD-10-CM | POA: Diagnosis not present

## 2020-03-01 DIAGNOSIS — M6281 Muscle weakness (generalized): Secondary | ICD-10-CM | POA: Diagnosis not present

## 2020-03-01 DIAGNOSIS — M797 Fibromyalgia: Secondary | ICD-10-CM | POA: Diagnosis not present

## 2020-03-01 DIAGNOSIS — M25511 Pain in right shoulder: Secondary | ICD-10-CM | POA: Diagnosis not present

## 2020-03-01 DIAGNOSIS — M069 Rheumatoid arthritis, unspecified: Secondary | ICD-10-CM | POA: Diagnosis not present

## 2020-03-02 DIAGNOSIS — F411 Generalized anxiety disorder: Secondary | ICD-10-CM | POA: Diagnosis not present

## 2020-03-03 DIAGNOSIS — M069 Rheumatoid arthritis, unspecified: Secondary | ICD-10-CM | POA: Diagnosis not present

## 2020-03-03 DIAGNOSIS — M6281 Muscle weakness (generalized): Secondary | ICD-10-CM | POA: Diagnosis not present

## 2020-03-03 DIAGNOSIS — M25511 Pain in right shoulder: Secondary | ICD-10-CM | POA: Diagnosis not present

## 2020-03-03 DIAGNOSIS — G894 Chronic pain syndrome: Secondary | ICD-10-CM | POA: Diagnosis not present

## 2020-03-03 DIAGNOSIS — M797 Fibromyalgia: Secondary | ICD-10-CM | POA: Diagnosis not present

## 2020-03-05 DIAGNOSIS — G9340 Encephalopathy, unspecified: Secondary | ICD-10-CM | POA: Diagnosis not present

## 2020-03-05 DIAGNOSIS — K219 Gastro-esophageal reflux disease without esophagitis: Secondary | ICD-10-CM | POA: Diagnosis not present

## 2020-03-05 DIAGNOSIS — M069 Rheumatoid arthritis, unspecified: Secondary | ICD-10-CM | POA: Diagnosis not present

## 2020-03-05 DIAGNOSIS — G894 Chronic pain syndrome: Secondary | ICD-10-CM | POA: Diagnosis not present

## 2020-03-05 DIAGNOSIS — I679 Cerebrovascular disease, unspecified: Secondary | ICD-10-CM | POA: Diagnosis not present

## 2020-03-05 DIAGNOSIS — I10 Essential (primary) hypertension: Secondary | ICD-10-CM | POA: Diagnosis not present

## 2020-03-05 DIAGNOSIS — M6281 Muscle weakness (generalized): Secondary | ICD-10-CM | POA: Diagnosis not present

## 2020-03-05 DIAGNOSIS — M25511 Pain in right shoulder: Secondary | ICD-10-CM | POA: Diagnosis not present

## 2020-03-05 DIAGNOSIS — F329 Major depressive disorder, single episode, unspecified: Secondary | ICD-10-CM | POA: Diagnosis not present

## 2020-03-05 DIAGNOSIS — E559 Vitamin D deficiency, unspecified: Secondary | ICD-10-CM | POA: Diagnosis not present

## 2020-03-05 DIAGNOSIS — M797 Fibromyalgia: Secondary | ICD-10-CM | POA: Diagnosis not present

## 2020-03-07 DIAGNOSIS — M25511 Pain in right shoulder: Secondary | ICD-10-CM | POA: Diagnosis not present

## 2020-03-07 DIAGNOSIS — M6281 Muscle weakness (generalized): Secondary | ICD-10-CM | POA: Diagnosis not present

## 2020-03-07 DIAGNOSIS — M069 Rheumatoid arthritis, unspecified: Secondary | ICD-10-CM | POA: Diagnosis not present

## 2020-03-07 DIAGNOSIS — M797 Fibromyalgia: Secondary | ICD-10-CM | POA: Diagnosis not present

## 2020-03-07 DIAGNOSIS — G894 Chronic pain syndrome: Secondary | ICD-10-CM | POA: Diagnosis not present

## 2020-03-08 DIAGNOSIS — M6281 Muscle weakness (generalized): Secondary | ICD-10-CM | POA: Diagnosis not present

## 2020-03-08 DIAGNOSIS — M797 Fibromyalgia: Secondary | ICD-10-CM | POA: Diagnosis not present

## 2020-03-08 DIAGNOSIS — M25511 Pain in right shoulder: Secondary | ICD-10-CM | POA: Diagnosis not present

## 2020-03-08 DIAGNOSIS — M069 Rheumatoid arthritis, unspecified: Secondary | ICD-10-CM | POA: Diagnosis not present

## 2020-03-08 DIAGNOSIS — G894 Chronic pain syndrome: Secondary | ICD-10-CM | POA: Diagnosis not present

## 2020-03-09 DIAGNOSIS — M25511 Pain in right shoulder: Secondary | ICD-10-CM | POA: Diagnosis not present

## 2020-03-09 DIAGNOSIS — R2689 Other abnormalities of gait and mobility: Secondary | ICD-10-CM | POA: Diagnosis not present

## 2020-03-09 DIAGNOSIS — M069 Rheumatoid arthritis, unspecified: Secondary | ICD-10-CM | POA: Diagnosis not present

## 2020-03-09 DIAGNOSIS — M797 Fibromyalgia: Secondary | ICD-10-CM | POA: Diagnosis not present

## 2020-03-09 DIAGNOSIS — M6281 Muscle weakness (generalized): Secondary | ICD-10-CM | POA: Diagnosis not present

## 2020-03-09 DIAGNOSIS — G894 Chronic pain syndrome: Secondary | ICD-10-CM | POA: Diagnosis not present

## 2020-03-09 DIAGNOSIS — M1711 Unilateral primary osteoarthritis, right knee: Secondary | ICD-10-CM | POA: Diagnosis not present

## 2020-03-12 DIAGNOSIS — G894 Chronic pain syndrome: Secondary | ICD-10-CM | POA: Diagnosis not present

## 2020-03-12 DIAGNOSIS — M797 Fibromyalgia: Secondary | ICD-10-CM | POA: Diagnosis not present

## 2020-03-12 DIAGNOSIS — M6281 Muscle weakness (generalized): Secondary | ICD-10-CM | POA: Diagnosis not present

## 2020-03-12 DIAGNOSIS — M1711 Unilateral primary osteoarthritis, right knee: Secondary | ICD-10-CM | POA: Diagnosis not present

## 2020-03-12 DIAGNOSIS — R2689 Other abnormalities of gait and mobility: Secondary | ICD-10-CM | POA: Diagnosis not present

## 2020-03-12 DIAGNOSIS — M069 Rheumatoid arthritis, unspecified: Secondary | ICD-10-CM | POA: Diagnosis not present

## 2020-03-12 DIAGNOSIS — M25511 Pain in right shoulder: Secondary | ICD-10-CM | POA: Diagnosis not present

## 2020-03-13 DIAGNOSIS — R2689 Other abnormalities of gait and mobility: Secondary | ICD-10-CM | POA: Diagnosis not present

## 2020-03-13 DIAGNOSIS — M1711 Unilateral primary osteoarthritis, right knee: Secondary | ICD-10-CM | POA: Diagnosis not present

## 2020-03-13 DIAGNOSIS — M797 Fibromyalgia: Secondary | ICD-10-CM | POA: Diagnosis not present

## 2020-03-13 DIAGNOSIS — M6281 Muscle weakness (generalized): Secondary | ICD-10-CM | POA: Diagnosis not present

## 2020-03-13 DIAGNOSIS — G894 Chronic pain syndrome: Secondary | ICD-10-CM | POA: Diagnosis not present

## 2020-03-13 DIAGNOSIS — M25511 Pain in right shoulder: Secondary | ICD-10-CM | POA: Diagnosis not present

## 2020-03-13 DIAGNOSIS — M069 Rheumatoid arthritis, unspecified: Secondary | ICD-10-CM | POA: Diagnosis not present

## 2020-03-15 DIAGNOSIS — M1711 Unilateral primary osteoarthritis, right knee: Secondary | ICD-10-CM | POA: Diagnosis not present

## 2020-03-15 DIAGNOSIS — R2689 Other abnormalities of gait and mobility: Secondary | ICD-10-CM | POA: Diagnosis not present

## 2020-03-15 DIAGNOSIS — M6281 Muscle weakness (generalized): Secondary | ICD-10-CM | POA: Diagnosis not present

## 2020-03-15 DIAGNOSIS — G894 Chronic pain syndrome: Secondary | ICD-10-CM | POA: Diagnosis not present

## 2020-03-15 DIAGNOSIS — M069 Rheumatoid arthritis, unspecified: Secondary | ICD-10-CM | POA: Diagnosis not present

## 2020-03-15 DIAGNOSIS — M25511 Pain in right shoulder: Secondary | ICD-10-CM | POA: Diagnosis not present

## 2020-03-15 DIAGNOSIS — M797 Fibromyalgia: Secondary | ICD-10-CM | POA: Diagnosis not present

## 2020-03-16 DIAGNOSIS — F411 Generalized anxiety disorder: Secondary | ICD-10-CM | POA: Diagnosis not present

## 2020-03-20 DIAGNOSIS — M25511 Pain in right shoulder: Secondary | ICD-10-CM | POA: Diagnosis not present

## 2020-03-20 DIAGNOSIS — M069 Rheumatoid arthritis, unspecified: Secondary | ICD-10-CM | POA: Diagnosis not present

## 2020-03-20 DIAGNOSIS — M1711 Unilateral primary osteoarthritis, right knee: Secondary | ICD-10-CM | POA: Diagnosis not present

## 2020-03-20 DIAGNOSIS — G894 Chronic pain syndrome: Secondary | ICD-10-CM | POA: Diagnosis not present

## 2020-03-20 DIAGNOSIS — M797 Fibromyalgia: Secondary | ICD-10-CM | POA: Diagnosis not present

## 2020-03-20 DIAGNOSIS — R2689 Other abnormalities of gait and mobility: Secondary | ICD-10-CM | POA: Diagnosis not present

## 2020-03-20 DIAGNOSIS — M6281 Muscle weakness (generalized): Secondary | ICD-10-CM | POA: Diagnosis not present

## 2020-03-21 DIAGNOSIS — M25511 Pain in right shoulder: Secondary | ICD-10-CM | POA: Diagnosis not present

## 2020-03-21 DIAGNOSIS — M6281 Muscle weakness (generalized): Secondary | ICD-10-CM | POA: Diagnosis not present

## 2020-03-21 DIAGNOSIS — R2689 Other abnormalities of gait and mobility: Secondary | ICD-10-CM | POA: Diagnosis not present

## 2020-03-21 DIAGNOSIS — M797 Fibromyalgia: Secondary | ICD-10-CM | POA: Diagnosis not present

## 2020-03-21 DIAGNOSIS — M069 Rheumatoid arthritis, unspecified: Secondary | ICD-10-CM | POA: Diagnosis not present

## 2020-03-21 DIAGNOSIS — M1711 Unilateral primary osteoarthritis, right knee: Secondary | ICD-10-CM | POA: Diagnosis not present

## 2020-03-21 DIAGNOSIS — G894 Chronic pain syndrome: Secondary | ICD-10-CM | POA: Diagnosis not present

## 2020-03-22 DIAGNOSIS — M797 Fibromyalgia: Secondary | ICD-10-CM | POA: Diagnosis not present

## 2020-03-22 DIAGNOSIS — M069 Rheumatoid arthritis, unspecified: Secondary | ICD-10-CM | POA: Diagnosis not present

## 2020-03-22 DIAGNOSIS — M6281 Muscle weakness (generalized): Secondary | ICD-10-CM | POA: Diagnosis not present

## 2020-03-22 DIAGNOSIS — M1711 Unilateral primary osteoarthritis, right knee: Secondary | ICD-10-CM | POA: Diagnosis not present

## 2020-03-22 DIAGNOSIS — G894 Chronic pain syndrome: Secondary | ICD-10-CM | POA: Diagnosis not present

## 2020-03-22 DIAGNOSIS — M25511 Pain in right shoulder: Secondary | ICD-10-CM | POA: Diagnosis not present

## 2020-03-22 DIAGNOSIS — R2689 Other abnormalities of gait and mobility: Secondary | ICD-10-CM | POA: Diagnosis not present

## 2020-03-26 DIAGNOSIS — R2689 Other abnormalities of gait and mobility: Secondary | ICD-10-CM | POA: Diagnosis not present

## 2020-03-26 DIAGNOSIS — M25511 Pain in right shoulder: Secondary | ICD-10-CM | POA: Diagnosis not present

## 2020-03-26 DIAGNOSIS — G894 Chronic pain syndrome: Secondary | ICD-10-CM | POA: Diagnosis not present

## 2020-03-26 DIAGNOSIS — M069 Rheumatoid arthritis, unspecified: Secondary | ICD-10-CM | POA: Diagnosis not present

## 2020-03-26 DIAGNOSIS — M797 Fibromyalgia: Secondary | ICD-10-CM | POA: Diagnosis not present

## 2020-03-26 DIAGNOSIS — M6281 Muscle weakness (generalized): Secondary | ICD-10-CM | POA: Diagnosis not present

## 2020-03-26 DIAGNOSIS — M1711 Unilateral primary osteoarthritis, right knee: Secondary | ICD-10-CM | POA: Diagnosis not present

## 2020-03-27 DIAGNOSIS — M797 Fibromyalgia: Secondary | ICD-10-CM | POA: Diagnosis not present

## 2020-03-27 DIAGNOSIS — M6281 Muscle weakness (generalized): Secondary | ICD-10-CM | POA: Diagnosis not present

## 2020-03-27 DIAGNOSIS — M1711 Unilateral primary osteoarthritis, right knee: Secondary | ICD-10-CM | POA: Diagnosis not present

## 2020-03-27 DIAGNOSIS — R2689 Other abnormalities of gait and mobility: Secondary | ICD-10-CM | POA: Diagnosis not present

## 2020-03-27 DIAGNOSIS — M25511 Pain in right shoulder: Secondary | ICD-10-CM | POA: Diagnosis not present

## 2020-03-27 DIAGNOSIS — G894 Chronic pain syndrome: Secondary | ICD-10-CM | POA: Diagnosis not present

## 2020-03-27 DIAGNOSIS — M069 Rheumatoid arthritis, unspecified: Secondary | ICD-10-CM | POA: Diagnosis not present

## 2020-03-29 DIAGNOSIS — I10 Essential (primary) hypertension: Secondary | ICD-10-CM | POA: Diagnosis not present

## 2020-03-29 DIAGNOSIS — K219 Gastro-esophageal reflux disease without esophagitis: Secondary | ICD-10-CM | POA: Diagnosis not present

## 2020-03-29 DIAGNOSIS — J302 Other seasonal allergic rhinitis: Secondary | ICD-10-CM | POA: Diagnosis not present

## 2020-03-29 DIAGNOSIS — M797 Fibromyalgia: Secondary | ICD-10-CM | POA: Diagnosis not present

## 2020-03-29 DIAGNOSIS — M159 Polyosteoarthritis, unspecified: Secondary | ICD-10-CM | POA: Diagnosis not present

## 2020-03-29 DIAGNOSIS — M1711 Unilateral primary osteoarthritis, right knee: Secondary | ICD-10-CM | POA: Diagnosis not present

## 2020-03-29 DIAGNOSIS — M069 Rheumatoid arthritis, unspecified: Secondary | ICD-10-CM | POA: Diagnosis not present

## 2020-03-29 DIAGNOSIS — J329 Chronic sinusitis, unspecified: Secondary | ICD-10-CM | POA: Diagnosis not present

## 2020-03-29 DIAGNOSIS — K589 Irritable bowel syndrome without diarrhea: Secondary | ICD-10-CM | POA: Diagnosis not present

## 2020-03-29 DIAGNOSIS — M25551 Pain in right hip: Secondary | ICD-10-CM | POA: Diagnosis not present

## 2020-04-02 DIAGNOSIS — F411 Generalized anxiety disorder: Secondary | ICD-10-CM | POA: Diagnosis not present

## 2020-04-05 DIAGNOSIS — M25511 Pain in right shoulder: Secondary | ICD-10-CM | POA: Diagnosis not present

## 2020-04-05 DIAGNOSIS — M6281 Muscle weakness (generalized): Secondary | ICD-10-CM | POA: Diagnosis not present

## 2020-04-05 DIAGNOSIS — G894 Chronic pain syndrome: Secondary | ICD-10-CM | POA: Diagnosis not present

## 2020-04-05 DIAGNOSIS — M069 Rheumatoid arthritis, unspecified: Secondary | ICD-10-CM | POA: Diagnosis not present

## 2020-04-05 DIAGNOSIS — M1711 Unilateral primary osteoarthritis, right knee: Secondary | ICD-10-CM | POA: Diagnosis not present

## 2020-04-05 DIAGNOSIS — R2689 Other abnormalities of gait and mobility: Secondary | ICD-10-CM | POA: Diagnosis not present

## 2020-04-05 DIAGNOSIS — M797 Fibromyalgia: Secondary | ICD-10-CM | POA: Diagnosis not present

## 2020-04-07 DIAGNOSIS — M797 Fibromyalgia: Secondary | ICD-10-CM | POA: Diagnosis not present

## 2020-04-07 DIAGNOSIS — G894 Chronic pain syndrome: Secondary | ICD-10-CM | POA: Diagnosis not present

## 2020-04-07 DIAGNOSIS — M1711 Unilateral primary osteoarthritis, right knee: Secondary | ICD-10-CM | POA: Diagnosis not present

## 2020-04-07 DIAGNOSIS — R2689 Other abnormalities of gait and mobility: Secondary | ICD-10-CM | POA: Diagnosis not present

## 2020-04-07 DIAGNOSIS — M069 Rheumatoid arthritis, unspecified: Secondary | ICD-10-CM | POA: Diagnosis not present

## 2020-04-07 DIAGNOSIS — M25511 Pain in right shoulder: Secondary | ICD-10-CM | POA: Diagnosis not present

## 2020-04-07 DIAGNOSIS — M6281 Muscle weakness (generalized): Secondary | ICD-10-CM | POA: Diagnosis not present

## 2020-04-09 ENCOUNTER — Other Ambulatory Visit: Payer: Self-pay

## 2020-04-09 ENCOUNTER — Ambulatory Visit (INDEPENDENT_AMBULATORY_CARE_PROVIDER_SITE_OTHER): Payer: Medicare PPO | Admitting: Urology

## 2020-04-09 ENCOUNTER — Encounter: Payer: Self-pay | Admitting: Urology

## 2020-04-09 VITALS — BP 154/86 | HR 65 | Temp 98.4°F | Ht 65.0 in | Wt 182.0 lb

## 2020-04-09 DIAGNOSIS — M6281 Muscle weakness (generalized): Secondary | ICD-10-CM | POA: Diagnosis not present

## 2020-04-09 DIAGNOSIS — M797 Fibromyalgia: Secondary | ICD-10-CM | POA: Diagnosis not present

## 2020-04-09 DIAGNOSIS — G894 Chronic pain syndrome: Secondary | ICD-10-CM | POA: Diagnosis not present

## 2020-04-09 DIAGNOSIS — M1711 Unilateral primary osteoarthritis, right knee: Secondary | ICD-10-CM | POA: Diagnosis not present

## 2020-04-09 DIAGNOSIS — R2689 Other abnormalities of gait and mobility: Secondary | ICD-10-CM | POA: Diagnosis not present

## 2020-04-09 DIAGNOSIS — N390 Urinary tract infection, site not specified: Secondary | ICD-10-CM | POA: Diagnosis not present

## 2020-04-09 DIAGNOSIS — M25511 Pain in right shoulder: Secondary | ICD-10-CM | POA: Diagnosis not present

## 2020-04-09 DIAGNOSIS — M069 Rheumatoid arthritis, unspecified: Secondary | ICD-10-CM | POA: Diagnosis not present

## 2020-04-09 LAB — BLADDER SCAN AMB NON-IMAGING: Scan Result: 180

## 2020-04-09 MED ORDER — MIRABEGRON ER 25 MG PO TB24: 25.0000 mg | ORAL_TABLET | Freq: Every day | ORAL | 0 refills | Status: DC

## 2020-04-09 NOTE — Patient Instructions (Signed)

## 2020-04-09 NOTE — Progress Notes (Signed)
04/09/2020 3:04 PM   Robin Blackburn 03/24/1946 [id number]  Referring provider: Redmond School, MD [address] [place],  [place] [address]  Recurrent UTI  HPI: Mr Robin Blackburn is a 74yo here for evaluation of recurrent UTI.  In the past year she has gotten 2-3 UTIs per patient. She was previously seen by Dr. Karsten Blackburn at [facility]. She uses 3 pads per day and they are soaked. She has urgency and nocturia 2x. Stream strong. No numbness in her fingers or toes. She is not on any OAB meds. PVR 180cc.  She is in a wheelchair and has poor mobility.   No culture results are available.   PMH: Past Medical History:  Diagnosis Date  . Acid reflux   . Anxiety   . 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 Depression   . Fibromyalgia   . Hypertension   . Neuropathy   . Rheumatoid arthritis ([place])   . Stroke [facility]) 02/17/14    Surgical History: Past Surgical History:  Procedure Laterality Date  . ABDOMINAL HYSTERECTOMY    . ANKLE RECONSTRUCTION    . APPENDECTOMY    . BACK SURGERY    . CHOLECYSTECTOMY    . KNEE SURGERY      Home Medications:  Allergies as of 04/09/2020      Reactions   Demerol [meperidine] Anaphylaxis   Hydromorphone Anaphylaxis   Keflex [cephalexin] Nausea And Vomiting   Penicillins Anaphylaxis   Has patient had a PCN reaction causing immediate rash, facial/tongue/throat swelling, SOB or lightheadedness with hypotension: Yes Has patient had a PCN reaction causing severe rash involving mucus membranes or skin necrosis: No Has patient had a PCN reaction that required hospitalization No Has patient had a PCN reaction occurring within the last 10 years: No If all of the above answers are "NO", then may proceed with Cephalosporin use.   Sulfa Antibiotics Nausea And Vomiting   Bisphosphonates    GI intolerance   Codeine    Fentanyl Other (See Comments)   "felt like I had demons in my head"   Iohexol    Percocet [oxycodone-acetaminophen]  Swelling   Mouth swelling.   Shellfish Allergy    Glucosamine not an option   Terramycin [oxytetracycline] Nausea And Vomiting   Ciprofloxacin Nausea Only, Rash   Levaquin [levofloxacin In D5w] Nausea And Vomiting, Rash   Morphine And Related Rash      Medication List       Accurate as of April 09, 2020  3:04 PM. If you have any questions, ask your nurse or doctor.        acetaminophen 500 MG tablet Commonly known as: TYLENOL Take 500 mg by mouth every 6 (six) hours as needed for mild pain or moderate pain.   ALPRAZolam 0.5 MG tablet Commonly known as: XANAX Take 1 tablet (0.5 mg total) by mouth 3 (three) times daily as needed for anxiety.   busPIRone 15 MG tablet Commonly known as: BUSPAR Take 15 mg by mouth 2 (two) times daily.   Cholecalciferol 125 MCG (5000 UT) Tabs Take 1 tablet by mouth daily.   clopidogrel 75 MG tablet Commonly known as: PLAVIX Take 75 mg by mouth at bedtime.   desloratadine 5 MG tablet Commonly known as: CLARINEX Take 5 mg by mouth every morning.   dicyclomine 20 MG tablet Commonly known as: BENTYL Take 20 mg by mouth every 6 (six) hours. Abdominal cramps   Ensure Max Protein Liqd Take 330 mLs (11  oz total) by mouth 2 (two) times daily.   HYDROcodone-acetaminophen 5-325 MG tablet Commonly known as: NORCO/VICODIN Take 1 tablet by mouth every 6 (six) hours as needed for severe pain.   hydroxychloroquine 200 MG tablet Commonly known as: PLAQUENIL Take 200 mg by mouth daily.   lansoprazole 30 MG capsule Commonly known as: PREVACID Take 30 mg by mouth 2 (two) times daily.   methocarbamol 750 MG tablet Commonly known as: ROBAXIN Take 1 tablet (750 mg total) by mouth every 6 (six) hours as needed for muscle spasms.   metoprolol tartrate 25 MG tablet Commonly known as: LOPRESSOR Take 1 tablet (25 mg total) by mouth 2 (two) times daily.   ondansetron 4 MG tablet Commonly known as: ZOFRAN Take 4 mg by mouth every 6 (six) hours as  needed for nausea or vomiting.   Propylene Glycol 0.6 % Soln Place 1 drop into both eyes daily. At noon.   QUEtiapine 25 MG tablet Commonly known as: SEROQUEL Take 0.5 tablets (12.5 mg total) by mouth 2 (two) times daily.   Restasis 0.05 % ophthalmic emulsion Generic drug: cycloSPORINE Place 1 drop into both eyes 2 (two) times daily.   sodium chloride 0.65 % Soln nasal spray Commonly known as: OCEAN Place 1 spray into both nostrils as needed for congestion. What changed: when to take this   triamcinolone cream 0.5 % Commonly known as: KENALOG Apply topically 2 (two) times daily. Apply to affected area twice daily as directed (left ankle area)   trolamine salicylate 10 % cream Commonly known as: ASPERCREME Apply 1 application topically as needed for muscle pain.       Allergies:  Allergies  Allergen Reactions  . Demerol [Meperidine] Anaphylaxis  . Hydromorphone Anaphylaxis  . Keflex [Cephalexin] Nausea And Vomiting  . Penicillins Anaphylaxis    Has patient had a PCN reaction causing immediate rash, facial/tongue/throat swelling, SOB or lightheadedness with hypotension: Yes Has patient had a PCN reaction causing severe rash involving mucus membranes or skin necrosis: No Has patient had a PCN reaction that required hospitalization No Has patient had a PCN reaction occurring within the last 10 years: No If all of the above answers are "NO", then may proceed with Cephalosporin use.   . Sulfa Antibiotics Nausea And Vomiting  . Bisphosphonates     GI intolerance  . Codeine   . Fentanyl Other (See Comments)    "felt like I had demons in my head"  . Iohexol   . Percocet [Oxycodone-Acetaminophen] Swelling    Mouth swelling.  . Shellfish Allergy     Glucosamine not an option  . Terramycin [Oxytetracycline] Nausea And Vomiting  . Ciprofloxacin Nausea Only and Rash  . Levaquin [Levofloxacin In D5w] Nausea And Vomiting and Rash  . Morphine And Related Rash    Family  History: Family History  Problem Relation Age of Onset  . Hypertension Father   . Transient ischemic attack Father   . Hypertension Brother   . CVA Maternal Grandfather   . Leukemia Brother     Social History:  reports that she has never smoked. She has never used smokeless tobacco. She reports that she does not drink alcohol and does not use drugs.  ROS: All other review of systems were reviewed and are negative except what is noted above in HPI  Physical Exam: BP (!) 154/86   Pulse 65   Temp 98.4 F (36.9 C)   Ht 5\' 5"  (1.651 m)   Wt 182 lb (82.6 kg)  BMI 30.29 kg/m   Constitutional:  Alert and oriented, No acute distress. HEENT:  AT, moist mucus membranes.  Trachea midline, no masses. Cardiovascular: No clubbing, cyanosis, or edema. Respiratory: Normal respiratory effort, no increased work of breathing. GI: Abdomen is soft, nontender, nondistended, no abdominal masses GU: No CVA tenderness.  Lymph: No cervical or inguinal lymphadenopathy. Skin: No rashes, bruises or suspicious lesions. Neurologic: Grossly intact, no focal deficits, moving all 4 extremities. Psychiatric: Normal mood and affect.  Laboratory Data: Lab Results  Component Value Date   WBC 11.3 (H) 08/23/2018   HGB 12.6 08/23/2018   HCT 41.5 08/23/2018   MCV 95.8 08/23/2018   PLT 422 (H) 08/23/2018    Lab Results  Component Value Date   CREATININE 0.78 08/23/2018    No results found for: PSA  No results found for: TESTOSTERONE  Lab Results  Component Value Date   HGBA1C 6.1 (H) 08/15/2015    Urinalysis    Component Value Date/Time   COLORURINE STRAW (A) 08/20/2018 1225   APPEARANCEUR CLEAR 08/20/2018 1225   LABSPEC 1.004 (L) 08/20/2018 1225   PHURINE 6.0 08/20/2018 1225   GLUCOSEU NEGATIVE 08/20/2018 1225   HGBUR MODERATE (A) 08/20/2018 1225   BILIRUBINUR NEGATIVE 08/20/2018 1225   KETONESUR NEGATIVE 08/20/2018 1225   PROTEINUR NEGATIVE 08/20/2018 1225   UROBILINOGEN 0.2  04/10/2015 2212   NITRITE NEGATIVE 08/20/2018 1225   LEUKOCYTESUR NEGATIVE 08/20/2018 1225    Lab Results  Component Value Date   BACTERIA NONE SEEN 08/20/2018    Pertinent Imaging:  No results found for this or any previous visit.  Results for orders placed during the hospital encounter of 02/19/14  US Venous Img Lower Bilateral  Narrative CLINICAL DATA:  Bilateral lower extremity pain and swelling  EXAM: BILATERAL LOWER EXTREMITY VENOUS DOPPLER ULTRASOUND  TECHNIQUE: Gray-scale sonography with graded compression, as well as color Doppler and duplex ultrasound were performed to evaluate the lower extremity deep venous systems from the level of the common femoral vein and including the common femoral, femoral, profunda femoral, popliteal and calf veins including the posterior tibial, peroneal and gastrocnemius veins when visible. The superficial great saphenous vein was also interrogated. Spectral Doppler was utilized to evaluate flow at rest and with distal augmentation maneuvers in the common femoral, femoral and popliteal veins.  COMPARISON:  Prior left lower extremity duplex venous ultrasound 02/24/2008  FINDINGS: RIGHT LOWER EXTREMITY  Common Femoral Vein: No evidence of thrombus. Normal compressibility, respiratory phasicity and response to augmentation.  Saphenofemoral Junction: No evidence of thrombus. Normal compressibility and flow on color Doppler imaging.  Profunda Femoral Vein: No evidence of thrombus. Normal compressibility and flow on color Doppler imaging.  Femoral Vein: No evidence of thrombus. Normal compressibility, respiratory phasicity and response to augmentation.  Popliteal Vein: No evidence of thrombus. Normal compressibility, respiratory phasicity and response to augmentation.  Calf Veins: No evidence of thrombus. Normal compressibility and flow on color Doppler imaging.  Superficial Great Saphenous Vein: No evidence of thrombus.  Normal compressibility and flow on color Doppler imaging.  Venous Reflux:  None.  Other Findings:  None.  LEFT LOWER EXTREMITY  Common Femoral Vein: No evidence of thrombus. Normal compressibility, respiratory phasicity and response to augmentation.  Saphenofemoral Junction: No evidence of thrombus. Normal compressibility and flow on color Doppler imaging.  Profunda Femoral Vein: No evidence of thrombus. Normal compressibility and flow on color Doppler imaging.  Femoral Vein: No evidence of thrombus. Normal compressibility, respiratory phasicity and response to augmentation.  Popliteal Vein:  No evidence of thrombus. Normal compressibility, respiratory phasicity and response to augmentation.  Calf Veins: No evidence of thrombus. Normal compressibility and flow on color Doppler imaging.  Superficial Great Saphenous Vein: No evidence of thrombus. Normal compressibility and flow on color Doppler imaging.  Venous Reflux:  None.  Other Findings:  None.  IMPRESSION: No evidence of deep venous thrombosis.   Electronically Signed By: Jacqulynn Cadet M.D. On: 02/21/2014 07:47  No results found for this or any previous visit.  No results found for this or any previous visit.  No results found for this or any previous visit.  No results found for this or any previous visit.  No results found for this or any previous visit.  No results found for this or any previous visit.   Assessment & Plan:    1. Recurrent UTI --We discussed the natural hx of recurrent UTIs and the various causes. We discussed the treatment options including post coital prophylaxis, daily prophylaxis, topical estrogen therapy. We will start her on mirabegron 25mg   - Urinalysis, Routine w reflex microscopic - BLADDER SCAN AMB NON-IMAGING   No follow-ups on file.  Nicolette Bang, MD  [facility] [place]

## 2020-04-09 NOTE — Progress Notes (Signed)

## 2020-04-10 ENCOUNTER — Ambulatory Visit: Payer: Medicare PPO | Admitting: Urology

## 2020-04-10 DIAGNOSIS — M25511 Pain in right shoulder: Secondary | ICD-10-CM | POA: Diagnosis not present

## 2020-04-10 DIAGNOSIS — G894 Chronic pain syndrome: Secondary | ICD-10-CM | POA: Diagnosis not present

## 2020-04-10 DIAGNOSIS — M069 Rheumatoid arthritis, unspecified: Secondary | ICD-10-CM | POA: Diagnosis not present

## 2020-04-10 DIAGNOSIS — M797 Fibromyalgia: Secondary | ICD-10-CM | POA: Diagnosis not present

## 2020-04-10 DIAGNOSIS — M1711 Unilateral primary osteoarthritis, right knee: Secondary | ICD-10-CM | POA: Diagnosis not present

## 2020-04-10 DIAGNOSIS — R2689 Other abnormalities of gait and mobility: Secondary | ICD-10-CM | POA: Diagnosis not present

## 2020-04-10 DIAGNOSIS — M6281 Muscle weakness (generalized): Secondary | ICD-10-CM | POA: Diagnosis not present

## 2020-04-11 DIAGNOSIS — G894 Chronic pain syndrome: Secondary | ICD-10-CM | POA: Diagnosis not present

## 2020-04-11 DIAGNOSIS — M25511 Pain in right shoulder: Secondary | ICD-10-CM | POA: Diagnosis not present

## 2020-04-11 DIAGNOSIS — M1711 Unilateral primary osteoarthritis, right knee: Secondary | ICD-10-CM | POA: Diagnosis not present

## 2020-04-11 DIAGNOSIS — R2689 Other abnormalities of gait and mobility: Secondary | ICD-10-CM | POA: Diagnosis not present

## 2020-04-11 DIAGNOSIS — M797 Fibromyalgia: Secondary | ICD-10-CM | POA: Diagnosis not present

## 2020-04-11 DIAGNOSIS — M069 Rheumatoid arthritis, unspecified: Secondary | ICD-10-CM | POA: Diagnosis not present

## 2020-04-11 DIAGNOSIS — M6281 Muscle weakness (generalized): Secondary | ICD-10-CM | POA: Diagnosis not present

## 2020-04-12 DIAGNOSIS — M1711 Unilateral primary osteoarthritis, right knee: Secondary | ICD-10-CM | POA: Diagnosis not present

## 2020-04-12 DIAGNOSIS — M069 Rheumatoid arthritis, unspecified: Secondary | ICD-10-CM | POA: Diagnosis not present

## 2020-04-12 DIAGNOSIS — M25511 Pain in right shoulder: Secondary | ICD-10-CM | POA: Diagnosis not present

## 2020-04-12 DIAGNOSIS — M797 Fibromyalgia: Secondary | ICD-10-CM | POA: Diagnosis not present

## 2020-04-12 DIAGNOSIS — R2689 Other abnormalities of gait and mobility: Secondary | ICD-10-CM | POA: Diagnosis not present

## 2020-04-12 DIAGNOSIS — M6281 Muscle weakness (generalized): Secondary | ICD-10-CM | POA: Diagnosis not present

## 2020-04-12 DIAGNOSIS — G894 Chronic pain syndrome: Secondary | ICD-10-CM | POA: Diagnosis not present

## 2020-04-13 DIAGNOSIS — M1711 Unilateral primary osteoarthritis, right knee: Secondary | ICD-10-CM | POA: Diagnosis not present

## 2020-04-13 DIAGNOSIS — M25511 Pain in right shoulder: Secondary | ICD-10-CM | POA: Diagnosis not present

## 2020-04-13 DIAGNOSIS — G894 Chronic pain syndrome: Secondary | ICD-10-CM | POA: Diagnosis not present

## 2020-04-13 DIAGNOSIS — F411 Generalized anxiety disorder: Secondary | ICD-10-CM | POA: Diagnosis not present

## 2020-04-13 DIAGNOSIS — M6281 Muscle weakness (generalized): Secondary | ICD-10-CM | POA: Diagnosis not present

## 2020-04-13 DIAGNOSIS — M797 Fibromyalgia: Secondary | ICD-10-CM | POA: Diagnosis not present

## 2020-04-13 DIAGNOSIS — R2689 Other abnormalities of gait and mobility: Secondary | ICD-10-CM | POA: Diagnosis not present

## 2020-04-13 DIAGNOSIS — M069 Rheumatoid arthritis, unspecified: Secondary | ICD-10-CM | POA: Diagnosis not present

## 2020-04-16 DIAGNOSIS — R2689 Other abnormalities of gait and mobility: Secondary | ICD-10-CM | POA: Diagnosis not present

## 2020-04-16 DIAGNOSIS — M1711 Unilateral primary osteoarthritis, right knee: Secondary | ICD-10-CM | POA: Diagnosis not present

## 2020-04-16 DIAGNOSIS — M797 Fibromyalgia: Secondary | ICD-10-CM | POA: Diagnosis not present

## 2020-04-16 DIAGNOSIS — M069 Rheumatoid arthritis, unspecified: Secondary | ICD-10-CM | POA: Diagnosis not present

## 2020-04-16 DIAGNOSIS — M25511 Pain in right shoulder: Secondary | ICD-10-CM | POA: Diagnosis not present

## 2020-04-16 DIAGNOSIS — M6281 Muscle weakness (generalized): Secondary | ICD-10-CM | POA: Diagnosis not present

## 2020-04-16 DIAGNOSIS — G894 Chronic pain syndrome: Secondary | ICD-10-CM | POA: Diagnosis not present

## 2020-04-17 DIAGNOSIS — M6281 Muscle weakness (generalized): Secondary | ICD-10-CM | POA: Diagnosis not present

## 2020-04-17 DIAGNOSIS — M797 Fibromyalgia: Secondary | ICD-10-CM | POA: Diagnosis not present

## 2020-04-17 DIAGNOSIS — R2689 Other abnormalities of gait and mobility: Secondary | ICD-10-CM | POA: Diagnosis not present

## 2020-04-17 DIAGNOSIS — M069 Rheumatoid arthritis, unspecified: Secondary | ICD-10-CM | POA: Diagnosis not present

## 2020-04-17 DIAGNOSIS — M25511 Pain in right shoulder: Secondary | ICD-10-CM | POA: Diagnosis not present

## 2020-04-17 DIAGNOSIS — G894 Chronic pain syndrome: Secondary | ICD-10-CM | POA: Diagnosis not present

## 2020-04-17 DIAGNOSIS — M1711 Unilateral primary osteoarthritis, right knee: Secondary | ICD-10-CM | POA: Diagnosis not present

## 2020-04-18 DIAGNOSIS — R2689 Other abnormalities of gait and mobility: Secondary | ICD-10-CM | POA: Diagnosis not present

## 2020-04-18 DIAGNOSIS — M6281 Muscle weakness (generalized): Secondary | ICD-10-CM | POA: Diagnosis not present

## 2020-04-18 DIAGNOSIS — M797 Fibromyalgia: Secondary | ICD-10-CM | POA: Diagnosis not present

## 2020-04-18 DIAGNOSIS — G894 Chronic pain syndrome: Secondary | ICD-10-CM | POA: Diagnosis not present

## 2020-04-18 DIAGNOSIS — M069 Rheumatoid arthritis, unspecified: Secondary | ICD-10-CM | POA: Diagnosis not present

## 2020-04-18 DIAGNOSIS — M25511 Pain in right shoulder: Secondary | ICD-10-CM | POA: Diagnosis not present

## 2020-04-18 DIAGNOSIS — M1711 Unilateral primary osteoarthritis, right knee: Secondary | ICD-10-CM | POA: Diagnosis not present

## 2020-04-19 DIAGNOSIS — R2689 Other abnormalities of gait and mobility: Secondary | ICD-10-CM | POA: Diagnosis not present

## 2020-04-19 DIAGNOSIS — M6281 Muscle weakness (generalized): Secondary | ICD-10-CM | POA: Diagnosis not present

## 2020-04-19 DIAGNOSIS — M797 Fibromyalgia: Secondary | ICD-10-CM | POA: Diagnosis not present

## 2020-04-19 DIAGNOSIS — M069 Rheumatoid arthritis, unspecified: Secondary | ICD-10-CM | POA: Diagnosis not present

## 2020-04-19 DIAGNOSIS — M25511 Pain in right shoulder: Secondary | ICD-10-CM | POA: Diagnosis not present

## 2020-04-19 DIAGNOSIS — M1711 Unilateral primary osteoarthritis, right knee: Secondary | ICD-10-CM | POA: Diagnosis not present

## 2020-04-19 DIAGNOSIS — G894 Chronic pain syndrome: Secondary | ICD-10-CM | POA: Diagnosis not present

## 2020-04-20 DIAGNOSIS — F411 Generalized anxiety disorder: Secondary | ICD-10-CM | POA: Diagnosis not present

## 2020-04-21 DIAGNOSIS — M797 Fibromyalgia: Secondary | ICD-10-CM | POA: Diagnosis not present

## 2020-04-21 DIAGNOSIS — M6281 Muscle weakness (generalized): Secondary | ICD-10-CM | POA: Diagnosis not present

## 2020-04-21 DIAGNOSIS — G894 Chronic pain syndrome: Secondary | ICD-10-CM | POA: Diagnosis not present

## 2020-04-21 DIAGNOSIS — M069 Rheumatoid arthritis, unspecified: Secondary | ICD-10-CM | POA: Diagnosis not present

## 2020-04-21 DIAGNOSIS — M1711 Unilateral primary osteoarthritis, right knee: Secondary | ICD-10-CM | POA: Diagnosis not present

## 2020-04-21 DIAGNOSIS — M25511 Pain in right shoulder: Secondary | ICD-10-CM | POA: Diagnosis not present

## 2020-04-21 DIAGNOSIS — R2689 Other abnormalities of gait and mobility: Secondary | ICD-10-CM | POA: Diagnosis not present

## 2020-04-23 DIAGNOSIS — M159 Polyosteoarthritis, unspecified: Secondary | ICD-10-CM | POA: Diagnosis not present

## 2020-04-23 DIAGNOSIS — M62838 Other muscle spasm: Secondary | ICD-10-CM | POA: Diagnosis not present

## 2020-04-23 DIAGNOSIS — M19011 Primary osteoarthritis, right shoulder: Secondary | ICD-10-CM | POA: Diagnosis not present

## 2020-04-23 DIAGNOSIS — M25511 Pain in right shoulder: Secondary | ICD-10-CM | POA: Diagnosis not present

## 2020-04-23 DIAGNOSIS — G894 Chronic pain syndrome: Secondary | ICD-10-CM | POA: Diagnosis not present

## 2020-04-23 DIAGNOSIS — F419 Anxiety disorder, unspecified: Secondary | ICD-10-CM | POA: Diagnosis not present

## 2020-04-23 DIAGNOSIS — M069 Rheumatoid arthritis, unspecified: Secondary | ICD-10-CM | POA: Diagnosis not present

## 2020-04-23 DIAGNOSIS — G629 Polyneuropathy, unspecified: Secondary | ICD-10-CM | POA: Diagnosis not present

## 2020-04-23 DIAGNOSIS — M1711 Unilateral primary osteoarthritis, right knee: Secondary | ICD-10-CM | POA: Diagnosis not present

## 2020-04-23 DIAGNOSIS — M6281 Muscle weakness (generalized): Secondary | ICD-10-CM | POA: Diagnosis not present

## 2020-04-23 DIAGNOSIS — R2689 Other abnormalities of gait and mobility: Secondary | ICD-10-CM | POA: Diagnosis not present

## 2020-04-23 DIAGNOSIS — M797 Fibromyalgia: Secondary | ICD-10-CM | POA: Diagnosis not present

## 2020-04-24 DIAGNOSIS — M6281 Muscle weakness (generalized): Secondary | ICD-10-CM | POA: Diagnosis not present

## 2020-04-24 DIAGNOSIS — M069 Rheumatoid arthritis, unspecified: Secondary | ICD-10-CM | POA: Diagnosis not present

## 2020-04-24 DIAGNOSIS — M25511 Pain in right shoulder: Secondary | ICD-10-CM | POA: Diagnosis not present

## 2020-04-24 DIAGNOSIS — M1711 Unilateral primary osteoarthritis, right knee: Secondary | ICD-10-CM | POA: Diagnosis not present

## 2020-04-24 DIAGNOSIS — I10 Essential (primary) hypertension: Secondary | ICD-10-CM | POA: Diagnosis not present

## 2020-04-24 DIAGNOSIS — R2689 Other abnormalities of gait and mobility: Secondary | ICD-10-CM | POA: Diagnosis not present

## 2020-04-24 DIAGNOSIS — M797 Fibromyalgia: Secondary | ICD-10-CM | POA: Diagnosis not present

## 2020-04-24 DIAGNOSIS — G894 Chronic pain syndrome: Secondary | ICD-10-CM | POA: Diagnosis not present

## 2020-04-25 DIAGNOSIS — G894 Chronic pain syndrome: Secondary | ICD-10-CM | POA: Diagnosis not present

## 2020-04-25 DIAGNOSIS — M1711 Unilateral primary osteoarthritis, right knee: Secondary | ICD-10-CM | POA: Diagnosis not present

## 2020-04-25 DIAGNOSIS — M797 Fibromyalgia: Secondary | ICD-10-CM | POA: Diagnosis not present

## 2020-04-25 DIAGNOSIS — M25511 Pain in right shoulder: Secondary | ICD-10-CM | POA: Diagnosis not present

## 2020-04-25 DIAGNOSIS — M6281 Muscle weakness (generalized): Secondary | ICD-10-CM | POA: Diagnosis not present

## 2020-04-25 DIAGNOSIS — M069 Rheumatoid arthritis, unspecified: Secondary | ICD-10-CM | POA: Diagnosis not present

## 2020-04-25 DIAGNOSIS — R2689 Other abnormalities of gait and mobility: Secondary | ICD-10-CM | POA: Diagnosis not present

## 2020-04-26 DIAGNOSIS — I10 Essential (primary) hypertension: Secondary | ICD-10-CM | POA: Diagnosis not present

## 2020-04-26 DIAGNOSIS — M797 Fibromyalgia: Secondary | ICD-10-CM | POA: Diagnosis not present

## 2020-04-26 DIAGNOSIS — M25511 Pain in right shoulder: Secondary | ICD-10-CM | POA: Diagnosis not present

## 2020-04-26 DIAGNOSIS — K219 Gastro-esophageal reflux disease without esophagitis: Secondary | ICD-10-CM | POA: Diagnosis not present

## 2020-04-26 DIAGNOSIS — M6281 Muscle weakness (generalized): Secondary | ICD-10-CM | POA: Diagnosis not present

## 2020-04-26 DIAGNOSIS — G894 Chronic pain syndrome: Secondary | ICD-10-CM | POA: Diagnosis not present

## 2020-04-26 DIAGNOSIS — R2689 Other abnormalities of gait and mobility: Secondary | ICD-10-CM | POA: Diagnosis not present

## 2020-04-26 DIAGNOSIS — M1711 Unilateral primary osteoarthritis, right knee: Secondary | ICD-10-CM | POA: Diagnosis not present

## 2020-04-26 DIAGNOSIS — M069 Rheumatoid arthritis, unspecified: Secondary | ICD-10-CM | POA: Diagnosis not present

## 2020-04-27 DIAGNOSIS — M1711 Unilateral primary osteoarthritis, right knee: Secondary | ICD-10-CM | POA: Diagnosis not present

## 2020-04-27 DIAGNOSIS — G894 Chronic pain syndrome: Secondary | ICD-10-CM | POA: Diagnosis not present

## 2020-04-27 DIAGNOSIS — M25511 Pain in right shoulder: Secondary | ICD-10-CM | POA: Diagnosis not present

## 2020-04-27 DIAGNOSIS — M6281 Muscle weakness (generalized): Secondary | ICD-10-CM | POA: Diagnosis not present

## 2020-04-27 DIAGNOSIS — M797 Fibromyalgia: Secondary | ICD-10-CM | POA: Diagnosis not present

## 2020-04-27 DIAGNOSIS — M069 Rheumatoid arthritis, unspecified: Secondary | ICD-10-CM | POA: Diagnosis not present

## 2020-04-27 DIAGNOSIS — R2689 Other abnormalities of gait and mobility: Secondary | ICD-10-CM | POA: Diagnosis not present

## 2020-04-28 DIAGNOSIS — R2689 Other abnormalities of gait and mobility: Secondary | ICD-10-CM | POA: Diagnosis not present

## 2020-04-28 DIAGNOSIS — M069 Rheumatoid arthritis, unspecified: Secondary | ICD-10-CM | POA: Diagnosis not present

## 2020-04-28 DIAGNOSIS — M6281 Muscle weakness (generalized): Secondary | ICD-10-CM | POA: Diagnosis not present

## 2020-04-28 DIAGNOSIS — M797 Fibromyalgia: Secondary | ICD-10-CM | POA: Diagnosis not present

## 2020-04-28 DIAGNOSIS — M25511 Pain in right shoulder: Secondary | ICD-10-CM | POA: Diagnosis not present

## 2020-04-28 DIAGNOSIS — G894 Chronic pain syndrome: Secondary | ICD-10-CM | POA: Diagnosis not present

## 2020-04-28 DIAGNOSIS — M1711 Unilateral primary osteoarthritis, right knee: Secondary | ICD-10-CM | POA: Diagnosis not present

## 2020-04-30 DIAGNOSIS — M6281 Muscle weakness (generalized): Secondary | ICD-10-CM | POA: Diagnosis not present

## 2020-04-30 DIAGNOSIS — M1711 Unilateral primary osteoarthritis, right knee: Secondary | ICD-10-CM | POA: Diagnosis not present

## 2020-04-30 DIAGNOSIS — G894 Chronic pain syndrome: Secondary | ICD-10-CM | POA: Diagnosis not present

## 2020-04-30 DIAGNOSIS — M069 Rheumatoid arthritis, unspecified: Secondary | ICD-10-CM | POA: Diagnosis not present

## 2020-04-30 DIAGNOSIS — M797 Fibromyalgia: Secondary | ICD-10-CM | POA: Diagnosis not present

## 2020-04-30 DIAGNOSIS — R2689 Other abnormalities of gait and mobility: Secondary | ICD-10-CM | POA: Diagnosis not present

## 2020-04-30 DIAGNOSIS — M25511 Pain in right shoulder: Secondary | ICD-10-CM | POA: Diagnosis not present

## 2020-05-01 DIAGNOSIS — M069 Rheumatoid arthritis, unspecified: Secondary | ICD-10-CM | POA: Diagnosis not present

## 2020-05-01 DIAGNOSIS — G894 Chronic pain syndrome: Secondary | ICD-10-CM | POA: Diagnosis not present

## 2020-05-01 DIAGNOSIS — M6281 Muscle weakness (generalized): Secondary | ICD-10-CM | POA: Diagnosis not present

## 2020-05-01 DIAGNOSIS — R2689 Other abnormalities of gait and mobility: Secondary | ICD-10-CM | POA: Diagnosis not present

## 2020-05-01 DIAGNOSIS — M1711 Unilateral primary osteoarthritis, right knee: Secondary | ICD-10-CM | POA: Diagnosis not present

## 2020-05-01 DIAGNOSIS — M25511 Pain in right shoulder: Secondary | ICD-10-CM | POA: Diagnosis not present

## 2020-05-01 DIAGNOSIS — M797 Fibromyalgia: Secondary | ICD-10-CM | POA: Diagnosis not present

## 2020-05-02 DIAGNOSIS — G894 Chronic pain syndrome: Secondary | ICD-10-CM | POA: Diagnosis not present

## 2020-05-02 DIAGNOSIS — R2689 Other abnormalities of gait and mobility: Secondary | ICD-10-CM | POA: Diagnosis not present

## 2020-05-02 DIAGNOSIS — M069 Rheumatoid arthritis, unspecified: Secondary | ICD-10-CM | POA: Diagnosis not present

## 2020-05-02 DIAGNOSIS — M25511 Pain in right shoulder: Secondary | ICD-10-CM | POA: Diagnosis not present

## 2020-05-02 DIAGNOSIS — M6281 Muscle weakness (generalized): Secondary | ICD-10-CM | POA: Diagnosis not present

## 2020-05-02 DIAGNOSIS — M1711 Unilateral primary osteoarthritis, right knee: Secondary | ICD-10-CM | POA: Diagnosis not present

## 2020-05-02 DIAGNOSIS — M797 Fibromyalgia: Secondary | ICD-10-CM | POA: Diagnosis not present

## 2020-05-04 DIAGNOSIS — M6281 Muscle weakness (generalized): Secondary | ICD-10-CM | POA: Diagnosis not present

## 2020-05-04 DIAGNOSIS — M069 Rheumatoid arthritis, unspecified: Secondary | ICD-10-CM | POA: Diagnosis not present

## 2020-05-04 DIAGNOSIS — M1711 Unilateral primary osteoarthritis, right knee: Secondary | ICD-10-CM | POA: Diagnosis not present

## 2020-05-04 DIAGNOSIS — R2689 Other abnormalities of gait and mobility: Secondary | ICD-10-CM | POA: Diagnosis not present

## 2020-05-04 DIAGNOSIS — G894 Chronic pain syndrome: Secondary | ICD-10-CM | POA: Diagnosis not present

## 2020-05-04 DIAGNOSIS — M797 Fibromyalgia: Secondary | ICD-10-CM | POA: Diagnosis not present

## 2020-05-04 DIAGNOSIS — M25511 Pain in right shoulder: Secondary | ICD-10-CM | POA: Diagnosis not present

## 2020-05-05 DIAGNOSIS — G894 Chronic pain syndrome: Secondary | ICD-10-CM | POA: Diagnosis not present

## 2020-05-05 DIAGNOSIS — M1711 Unilateral primary osteoarthritis, right knee: Secondary | ICD-10-CM | POA: Diagnosis not present

## 2020-05-05 DIAGNOSIS — M25511 Pain in right shoulder: Secondary | ICD-10-CM | POA: Diagnosis not present

## 2020-05-05 DIAGNOSIS — M797 Fibromyalgia: Secondary | ICD-10-CM | POA: Diagnosis not present

## 2020-05-05 DIAGNOSIS — R2689 Other abnormalities of gait and mobility: Secondary | ICD-10-CM | POA: Diagnosis not present

## 2020-05-05 DIAGNOSIS — M6281 Muscle weakness (generalized): Secondary | ICD-10-CM | POA: Diagnosis not present

## 2020-05-05 DIAGNOSIS — M069 Rheumatoid arthritis, unspecified: Secondary | ICD-10-CM | POA: Diagnosis not present

## 2020-05-07 DIAGNOSIS — G894 Chronic pain syndrome: Secondary | ICD-10-CM | POA: Diagnosis not present

## 2020-05-07 DIAGNOSIS — M6281 Muscle weakness (generalized): Secondary | ICD-10-CM | POA: Diagnosis not present

## 2020-05-07 DIAGNOSIS — M797 Fibromyalgia: Secondary | ICD-10-CM | POA: Diagnosis not present

## 2020-05-07 DIAGNOSIS — M25511 Pain in right shoulder: Secondary | ICD-10-CM | POA: Diagnosis not present

## 2020-05-07 DIAGNOSIS — M1711 Unilateral primary osteoarthritis, right knee: Secondary | ICD-10-CM | POA: Diagnosis not present

## 2020-05-07 DIAGNOSIS — R2689 Other abnormalities of gait and mobility: Secondary | ICD-10-CM | POA: Diagnosis not present

## 2020-05-07 DIAGNOSIS — M069 Rheumatoid arthritis, unspecified: Secondary | ICD-10-CM | POA: Diagnosis not present

## 2020-05-08 DIAGNOSIS — M1711 Unilateral primary osteoarthritis, right knee: Secondary | ICD-10-CM | POA: Diagnosis not present

## 2020-05-08 DIAGNOSIS — M6281 Muscle weakness (generalized): Secondary | ICD-10-CM | POA: Diagnosis not present

## 2020-05-08 DIAGNOSIS — M069 Rheumatoid arthritis, unspecified: Secondary | ICD-10-CM | POA: Diagnosis not present

## 2020-05-08 DIAGNOSIS — G894 Chronic pain syndrome: Secondary | ICD-10-CM | POA: Diagnosis not present

## 2020-05-08 DIAGNOSIS — M25511 Pain in right shoulder: Secondary | ICD-10-CM | POA: Diagnosis not present

## 2020-05-08 DIAGNOSIS — M797 Fibromyalgia: Secondary | ICD-10-CM | POA: Diagnosis not present

## 2020-05-08 DIAGNOSIS — R2689 Other abnormalities of gait and mobility: Secondary | ICD-10-CM | POA: Diagnosis not present

## 2020-05-09 DIAGNOSIS — M797 Fibromyalgia: Secondary | ICD-10-CM | POA: Diagnosis not present

## 2020-05-09 DIAGNOSIS — M6281 Muscle weakness (generalized): Secondary | ICD-10-CM | POA: Diagnosis not present

## 2020-05-09 DIAGNOSIS — G894 Chronic pain syndrome: Secondary | ICD-10-CM | POA: Diagnosis not present

## 2020-05-09 DIAGNOSIS — M069 Rheumatoid arthritis, unspecified: Secondary | ICD-10-CM | POA: Diagnosis not present

## 2020-05-09 DIAGNOSIS — M25511 Pain in right shoulder: Secondary | ICD-10-CM | POA: Diagnosis not present

## 2020-05-09 DIAGNOSIS — R2689 Other abnormalities of gait and mobility: Secondary | ICD-10-CM | POA: Diagnosis not present

## 2020-05-09 DIAGNOSIS — M1711 Unilateral primary osteoarthritis, right knee: Secondary | ICD-10-CM | POA: Diagnosis not present

## 2020-05-10 DIAGNOSIS — R2689 Other abnormalities of gait and mobility: Secondary | ICD-10-CM | POA: Diagnosis not present

## 2020-05-10 DIAGNOSIS — M6281 Muscle weakness (generalized): Secondary | ICD-10-CM | POA: Diagnosis not present

## 2020-05-10 DIAGNOSIS — M1711 Unilateral primary osteoarthritis, right knee: Secondary | ICD-10-CM | POA: Diagnosis not present

## 2020-05-10 DIAGNOSIS — M069 Rheumatoid arthritis, unspecified: Secondary | ICD-10-CM | POA: Diagnosis not present

## 2020-05-10 DIAGNOSIS — G894 Chronic pain syndrome: Secondary | ICD-10-CM | POA: Diagnosis not present

## 2020-05-10 DIAGNOSIS — M25511 Pain in right shoulder: Secondary | ICD-10-CM | POA: Diagnosis not present

## 2020-05-10 DIAGNOSIS — M797 Fibromyalgia: Secondary | ICD-10-CM | POA: Diagnosis not present

## 2020-05-11 DIAGNOSIS — M6281 Muscle weakness (generalized): Secondary | ICD-10-CM | POA: Diagnosis not present

## 2020-05-11 DIAGNOSIS — M797 Fibromyalgia: Secondary | ICD-10-CM | POA: Diagnosis not present

## 2020-05-11 DIAGNOSIS — R2689 Other abnormalities of gait and mobility: Secondary | ICD-10-CM | POA: Diagnosis not present

## 2020-05-11 DIAGNOSIS — M069 Rheumatoid arthritis, unspecified: Secondary | ICD-10-CM | POA: Diagnosis not present

## 2020-05-11 DIAGNOSIS — M25511 Pain in right shoulder: Secondary | ICD-10-CM | POA: Diagnosis not present

## 2020-05-11 DIAGNOSIS — F411 Generalized anxiety disorder: Secondary | ICD-10-CM | POA: Diagnosis not present

## 2020-05-11 DIAGNOSIS — M1711 Unilateral primary osteoarthritis, right knee: Secondary | ICD-10-CM | POA: Diagnosis not present

## 2020-05-11 DIAGNOSIS — G478 Other sleep disorders: Secondary | ICD-10-CM | POA: Diagnosis not present

## 2020-05-11 DIAGNOSIS — G894 Chronic pain syndrome: Secondary | ICD-10-CM | POA: Diagnosis not present

## 2020-05-11 DIAGNOSIS — F331 Major depressive disorder, recurrent, moderate: Secondary | ICD-10-CM | POA: Diagnosis not present

## 2020-05-12 DIAGNOSIS — M1711 Unilateral primary osteoarthritis, right knee: Secondary | ICD-10-CM | POA: Diagnosis not present

## 2020-05-12 DIAGNOSIS — M6281 Muscle weakness (generalized): Secondary | ICD-10-CM | POA: Diagnosis not present

## 2020-05-12 DIAGNOSIS — M25511 Pain in right shoulder: Secondary | ICD-10-CM | POA: Diagnosis not present

## 2020-05-12 DIAGNOSIS — M069 Rheumatoid arthritis, unspecified: Secondary | ICD-10-CM | POA: Diagnosis not present

## 2020-05-12 DIAGNOSIS — M797 Fibromyalgia: Secondary | ICD-10-CM | POA: Diagnosis not present

## 2020-05-12 DIAGNOSIS — R2689 Other abnormalities of gait and mobility: Secondary | ICD-10-CM | POA: Diagnosis not present

## 2020-05-12 DIAGNOSIS — G894 Chronic pain syndrome: Secondary | ICD-10-CM | POA: Diagnosis not present

## 2020-05-14 DIAGNOSIS — G894 Chronic pain syndrome: Secondary | ICD-10-CM | POA: Diagnosis not present

## 2020-05-14 DIAGNOSIS — M6281 Muscle weakness (generalized): Secondary | ICD-10-CM | POA: Diagnosis not present

## 2020-05-14 DIAGNOSIS — M069 Rheumatoid arthritis, unspecified: Secondary | ICD-10-CM | POA: Diagnosis not present

## 2020-05-14 DIAGNOSIS — M797 Fibromyalgia: Secondary | ICD-10-CM | POA: Diagnosis not present

## 2020-05-14 DIAGNOSIS — R2689 Other abnormalities of gait and mobility: Secondary | ICD-10-CM | POA: Diagnosis not present

## 2020-05-14 DIAGNOSIS — M1711 Unilateral primary osteoarthritis, right knee: Secondary | ICD-10-CM | POA: Diagnosis not present

## 2020-05-14 DIAGNOSIS — M25511 Pain in right shoulder: Secondary | ICD-10-CM | POA: Diagnosis not present

## 2020-05-15 DIAGNOSIS — M25511 Pain in right shoulder: Secondary | ICD-10-CM | POA: Diagnosis not present

## 2020-05-15 DIAGNOSIS — M069 Rheumatoid arthritis, unspecified: Secondary | ICD-10-CM | POA: Diagnosis not present

## 2020-05-15 DIAGNOSIS — M797 Fibromyalgia: Secondary | ICD-10-CM | POA: Diagnosis not present

## 2020-05-15 DIAGNOSIS — M6281 Muscle weakness (generalized): Secondary | ICD-10-CM | POA: Diagnosis not present

## 2020-05-15 DIAGNOSIS — M1711 Unilateral primary osteoarthritis, right knee: Secondary | ICD-10-CM | POA: Diagnosis not present

## 2020-05-15 DIAGNOSIS — G894 Chronic pain syndrome: Secondary | ICD-10-CM | POA: Diagnosis not present

## 2020-05-15 DIAGNOSIS — R2689 Other abnormalities of gait and mobility: Secondary | ICD-10-CM | POA: Diagnosis not present

## 2020-05-16 DIAGNOSIS — M069 Rheumatoid arthritis, unspecified: Secondary | ICD-10-CM | POA: Diagnosis not present

## 2020-05-16 DIAGNOSIS — M1711 Unilateral primary osteoarthritis, right knee: Secondary | ICD-10-CM | POA: Diagnosis not present

## 2020-05-16 DIAGNOSIS — M797 Fibromyalgia: Secondary | ICD-10-CM | POA: Diagnosis not present

## 2020-05-16 DIAGNOSIS — M6281 Muscle weakness (generalized): Secondary | ICD-10-CM | POA: Diagnosis not present

## 2020-05-16 DIAGNOSIS — G894 Chronic pain syndrome: Secondary | ICD-10-CM | POA: Diagnosis not present

## 2020-05-16 DIAGNOSIS — R2689 Other abnormalities of gait and mobility: Secondary | ICD-10-CM | POA: Diagnosis not present

## 2020-05-16 DIAGNOSIS — M25511 Pain in right shoulder: Secondary | ICD-10-CM | POA: Diagnosis not present

## 2020-05-17 DIAGNOSIS — M6281 Muscle weakness (generalized): Secondary | ICD-10-CM | POA: Diagnosis not present

## 2020-05-17 DIAGNOSIS — M1711 Unilateral primary osteoarthritis, right knee: Secondary | ICD-10-CM | POA: Diagnosis not present

## 2020-05-17 DIAGNOSIS — R2689 Other abnormalities of gait and mobility: Secondary | ICD-10-CM | POA: Diagnosis not present

## 2020-05-17 DIAGNOSIS — M25511 Pain in right shoulder: Secondary | ICD-10-CM | POA: Diagnosis not present

## 2020-05-17 DIAGNOSIS — M069 Rheumatoid arthritis, unspecified: Secondary | ICD-10-CM | POA: Diagnosis not present

## 2020-05-17 DIAGNOSIS — G894 Chronic pain syndrome: Secondary | ICD-10-CM | POA: Diagnosis not present

## 2020-05-17 DIAGNOSIS — M797 Fibromyalgia: Secondary | ICD-10-CM | POA: Diagnosis not present

## 2020-05-18 DIAGNOSIS — M6281 Muscle weakness (generalized): Secondary | ICD-10-CM | POA: Diagnosis not present

## 2020-05-18 DIAGNOSIS — M25511 Pain in right shoulder: Secondary | ICD-10-CM | POA: Diagnosis not present

## 2020-05-18 DIAGNOSIS — M797 Fibromyalgia: Secondary | ICD-10-CM | POA: Diagnosis not present

## 2020-05-18 DIAGNOSIS — G894 Chronic pain syndrome: Secondary | ICD-10-CM | POA: Diagnosis not present

## 2020-05-18 DIAGNOSIS — M1711 Unilateral primary osteoarthritis, right knee: Secondary | ICD-10-CM | POA: Diagnosis not present

## 2020-05-18 DIAGNOSIS — M069 Rheumatoid arthritis, unspecified: Secondary | ICD-10-CM | POA: Diagnosis not present

## 2020-05-18 DIAGNOSIS — R2689 Other abnormalities of gait and mobility: Secondary | ICD-10-CM | POA: Diagnosis not present

## 2020-05-21 ENCOUNTER — Other Ambulatory Visit: Payer: Self-pay

## 2020-05-21 ENCOUNTER — Ambulatory Visit (INDEPENDENT_AMBULATORY_CARE_PROVIDER_SITE_OTHER): Payer: Medicare PPO | Admitting: Urology

## 2020-05-21 ENCOUNTER — Encounter: Payer: Self-pay | Admitting: Urology

## 2020-05-21 VITALS — BP 156/91 | HR 77 | Temp 98.4°F | Ht 65.0 in | Wt 182.0 lb

## 2020-05-21 DIAGNOSIS — N390 Urinary tract infection, site not specified: Secondary | ICD-10-CM

## 2020-05-21 DIAGNOSIS — N3281 Overactive bladder: Secondary | ICD-10-CM

## 2020-05-21 DIAGNOSIS — F411 Generalized anxiety disorder: Secondary | ICD-10-CM | POA: Diagnosis not present

## 2020-05-21 LAB — BLADDER SCAN AMB NON-IMAGING: Scan Result: 111

## 2020-05-21 MED ORDER — NITROFURANTOIN MONOHYD MACRO 100 MG PO CAPS: 100.0000 mg | ORAL_CAPSULE | Freq: Two times a day (BID) | ORAL | 0 refills | Status: DC

## 2020-05-21 MED ORDER — MIRABEGRON ER 25 MG PO TB24: 25.0000 mg | ORAL_TABLET | Freq: Every day | ORAL | 11 refills | Status: DC

## 2020-05-21 NOTE — Progress Notes (Signed)
Urological Symptom Review  Patient is experiencing the following symptoms: Frequent urination Burning/pain with urination Urinary tract infection   Review of Systems  Gastrointestinal (upper)  : Negative for upper GI symptoms  Gastrointestinal (lower) : Diarrhea  Constitutional : Fatigue  Skin: Negative for skin symptoms  Eyes: Blurred vision  Ear/Nose/Throat : Sinus problems  Hematologic/Lymphatic: Negative for Hematologic/Lymphatic symptoms  Cardiovascular : Negative for cardiovascular symptoms  Respiratory : Negative for respiratory symptoms  Endocrine: Negative for endocrine symptoms  Musculoskeletal: Back pain Joint pain  Neurological: Headaches  Psychologic: Negative for psychiatric symptoms

## 2020-05-21 NOTE — Progress Notes (Signed)
05/21/2020 2:35 PM   Robin Blackburn Mar 26, 1946 [id number]  Referring provider: Redmond School, MD [address] [place],  [place] [address]  followup recurrent UTI and OAB  HPI: Robin Blackburn is a 74yo here for followup for recurrent UTI and OAB. She was placed on mirabegron 25mg  last visit. PVR 111cc today. She noted improvement in her incontinence and a decrease in her pad usage on mirabegron. She was then switch to oxybutynin 5mg  which is causing worsening suprapubic pressure and incontinence. She has a slight burn with each urination. No hematuria. She continues to have urinary urgency and frequency. No UTI since last visit.    PMH: Past Medical History:  Diagnosis Date  . Acid reflux   . Anxiety   . 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 Depression   . Fibromyalgia   . Hypertension   . Neuropathy   . Rheumatoid arthritis ([place])   . Stroke [facility]) 02/17/14    Surgical History: Past Surgical History:  Procedure Laterality Date  . ABDOMINAL HYSTERECTOMY    . ANKLE RECONSTRUCTION    . APPENDECTOMY    . BACK SURGERY    . CHOLECYSTECTOMY    . KNEE SURGERY      Home Medications:  Allergies as of 05/21/2020      Reactions   Demerol [meperidine] Anaphylaxis   Hydromorphone Anaphylaxis   Keflex [cephalexin] Nausea And Vomiting   Penicillins Anaphylaxis   Has patient had a PCN reaction causing immediate rash, facial/tongue/throat swelling, SOB or lightheadedness with hypotension: Yes Has patient had a PCN reaction causing severe rash involving mucus membranes or skin necrosis: No Has patient had a PCN reaction that required hospitalization No Has patient had a PCN reaction occurring within the last 10 years: No If all of the above answers are "NO", then may proceed with Cephalosporin use.   Sulfa Antibiotics Nausea And Vomiting   Bisphosphonates    GI intolerance   Codeine    Fentanyl Other (See Comments)   "felt like I had demons in my head"    Iohexol    Percocet [oxycodone-acetaminophen] Swelling   Mouth swelling.   Shellfish Allergy    Glucosamine not an option   Terramycin [oxytetracycline] Nausea And Vomiting   Ciprofloxacin Nausea Only, Rash   Levaquin [levofloxacin In D5w] Nausea And Vomiting, Rash   Morphine And Related Rash      Medication List       Accurate as of May 21, 2020  2:35 PM. If you have any questions, ask your nurse or doctor.        acetaminophen 500 MG tablet Commonly known as: TYLENOL Take 500 mg by mouth every 6 (six) hours as needed for mild pain or moderate pain.   ALPRAZolam 0.5 MG tablet Commonly known as: XANAX Take 1 tablet (0.5 mg total) by mouth 3 (three) times daily as needed for anxiety.   busPIRone 15 MG tablet Commonly known as: BUSPAR Take 15 mg by mouth 2 (two) times daily.   Cholecalciferol 125 MCG (5000 UT) Tabs Take 1 tablet by mouth daily.   clopidogrel 75 MG tablet Commonly known as: PLAVIX Take 75 mg by mouth at bedtime.   desloratadine 5 MG tablet Commonly known as: CLARINEX Take 5 mg by mouth every morning.   dicyclomine 20 MG tablet Commonly known as: BENTYL Take 20 mg by mouth every 6 (six) hours. Abdominal cramps   Ensure Max Protein Liqd Take 330 mLs (11 oz total) by  mouth 2 (two) times daily.   HYDROcodone-acetaminophen 5-325 MG tablet Commonly known as: NORCO/VICODIN Take 1 tablet by mouth every 6 (six) hours as needed for severe pain.   hydroxychloroquine 200 MG tablet Commonly known as: PLAQUENIL Take 200 mg by mouth daily.   lansoprazole 30 MG capsule Commonly known as: PREVACID Take 30 mg by mouth 2 (two) times daily.   methocarbamol 750 MG tablet Commonly known as: ROBAXIN Take 1 tablet (750 mg total) by mouth every 6 (six) hours as needed for muscle spasms.   metoprolol tartrate 25 MG tablet Commonly known as: LOPRESSOR Take 1 tablet (25 mg total) by mouth 2 (two) times daily.   mirabegron ER 25 MG Tb24 tablet Commonly known  as: MYRBETRIQ Take 1 tablet (25 mg total) by mouth daily.   ondansetron 4 MG tablet Commonly known as: ZOFRAN Take 4 mg by mouth every 6 (six) hours as needed for nausea or vomiting.   Propylene Glycol 0.6 % Soln Place 1 drop into both eyes daily. At noon.   QUEtiapine 25 MG tablet Commonly known as: SEROQUEL Take 0.5 tablets (12.5 mg total) by mouth 2 (two) times daily.   Restasis 0.05 % ophthalmic emulsion Generic drug: cycloSPORINE Place 1 drop into both eyes 2 (two) times daily.   sodium chloride 0.65 % Soln nasal spray Commonly known as: OCEAN Place 1 spray into both nostrils as needed for congestion. What changed: when to take this   triamcinolone cream 0.5 % Commonly known as: KENALOG Apply topically 2 (two) times daily. Apply to affected area twice daily as directed (left ankle area)   trolamine salicylate 10 % cream Commonly known as: ASPERCREME Apply 1 application topically as needed for muscle pain.       Allergies:  Allergies  Allergen Reactions  . Demerol [Meperidine] Anaphylaxis  . Hydromorphone Anaphylaxis  . Keflex [Cephalexin] Nausea And Vomiting  . Penicillins Anaphylaxis    Has patient had a PCN reaction causing immediate rash, facial/tongue/throat swelling, SOB or lightheadedness with hypotension: Yes Has patient had a PCN reaction causing severe rash involving mucus membranes or skin necrosis: No Has patient had a PCN reaction that required hospitalization No Has patient had a PCN reaction occurring within the last 10 years: No If all of the above answers are "NO", then may proceed with Cephalosporin use.   . Sulfa Antibiotics Nausea And Vomiting  . Bisphosphonates     GI intolerance  . Codeine   . Fentanyl Other (See Comments)    "felt like I had demons in my head"  . Iohexol   . Percocet [Oxycodone-Acetaminophen] Swelling    Mouth swelling.  . Shellfish Allergy     Glucosamine not an option  . Terramycin [Oxytetracycline] Nausea And  Vomiting  . Ciprofloxacin Nausea Only and Rash  . Levaquin [Levofloxacin In D5w] Nausea And Vomiting and Rash  . Morphine And Related Rash    Family History: Family History  Problem Relation Age of Onset  . Hypertension Father   . Transient ischemic attack Father   . Hypertension Brother   . CVA Maternal Grandfather   . Leukemia Brother     Social History:  reports that she has never smoked. She has never used smokeless tobacco. She reports that she does not drink alcohol and does not use drugs.  ROS: All other review of systems were reviewed and are negative except what is noted above in HPI  Physical Exam: BP (!) 156/91   Pulse 77   Temp  98.4 F (36.9 C)   Ht 5\' 5"  (1.651 m)   Wt 182 lb (82.6 kg)   BMI 30.29 kg/m   Constitutional:  Alert and oriented, No acute distress. HEENT: [place] AT, moist mucus membranes.  Trachea midline, no masses. Cardiovascular: No clubbing, cyanosis, or edema. Respiratory: Normal respiratory effort, no increased work of breathing. GI: Abdomen is soft, nontender, nondistended, no abdominal masses GU: No CVA tenderness.  Lymph: No cervical or inguinal lymphadenopathy. Skin: No rashes, bruises or suspicious lesions. Neurologic: Grossly intact, no focal deficits, moving all 4 extremities. Psychiatric: Normal mood and affect.  Laboratory Data: Lab Results  Component Value Date   WBC 11.3 (H) 08/23/2018   HGB 12.6 08/23/2018   HCT 41.5 08/23/2018   MCV 95.8 08/23/2018   PLT 422 (H) 08/23/2018    Lab Results  Component Value Date   CREATININE 0.78 08/23/2018    No results found for: PSA  No results found for: TESTOSTERONE  Lab Results  Component Value Date   HGBA1C 6.1 (H) 08/15/2015    Urinalysis    Component Value Date/Time   COLORURINE STRAW (A) 08/20/2018 1225   APPEARANCEUR CLEAR 08/20/2018 1225   LABSPEC 1.004 (L) 08/20/2018 1225   PHURINE 6.0 08/20/2018 1225   GLUCOSEU NEGATIVE 08/20/2018 1225   HGBUR MODERATE (A)  08/20/2018 1225   BILIRUBINUR NEGATIVE 08/20/2018 1225   KETONESUR NEGATIVE 08/20/2018 1225   PROTEINUR NEGATIVE 08/20/2018 1225   UROBILINOGEN 0.2 04/10/2015 2212   NITRITE NEGATIVE 08/20/2018 1225   LEUKOCYTESUR NEGATIVE 08/20/2018 1225    Lab Results  Component Value Date   BACTERIA NONE SEEN 08/20/2018    Pertinent Imaging:  No results found for this or any previous visit.  Results for orders placed during the hospital encounter of 02/19/14  US Venous Img Lower Bilateral  Narrative CLINICAL DATA:  Bilateral lower extremity pain and swelling  EXAM: BILATERAL LOWER EXTREMITY VENOUS DOPPLER ULTRASOUND  TECHNIQUE: Gray-scale sonography with graded compression, as well as color Doppler and duplex ultrasound were performed to evaluate the lower extremity deep venous systems from the level of the common femoral vein and including the common femoral, femoral, profunda femoral, popliteal and calf veins including the posterior tibial, peroneal and gastrocnemius veins when visible. The superficial great saphenous vein was also interrogated. Spectral Doppler was utilized to evaluate flow at rest and with distal augmentation maneuvers in the common femoral, femoral and popliteal veins.  COMPARISON:  Prior left lower extremity duplex venous ultrasound 02/24/2008  FINDINGS: RIGHT LOWER EXTREMITY  Common Femoral Vein: No evidence of thrombus. Normal compressibility, respiratory phasicity and response to augmentation.  Saphenofemoral Junction: No evidence of thrombus. Normal compressibility and flow on color Doppler imaging.  Profunda Femoral Vein: No evidence of thrombus. Normal compressibility and flow on color Doppler imaging.  Femoral Vein: No evidence of thrombus. Normal compressibility, respiratory phasicity and response to augmentation.  Popliteal Vein: No evidence of thrombus. Normal compressibility, respiratory phasicity and response to augmentation.  Calf  Veins: No evidence of thrombus. Normal compressibility and flow on color Doppler imaging.  Superficial Great Saphenous Vein: No evidence of thrombus. Normal compressibility and flow on color Doppler imaging.  Venous Reflux:  None.  Other Findings:  None.  LEFT LOWER EXTREMITY  Common Femoral Vein: No evidence of thrombus. Normal compressibility, respiratory phasicity and response to augmentation.  Saphenofemoral Junction: No evidence of thrombus. Normal compressibility and flow on color Doppler imaging.  Profunda Femoral Vein: No evidence of thrombus. Normal compressibility and flow on color  Doppler imaging.  Femoral Vein: No evidence of thrombus. Normal compressibility, respiratory phasicity and response to augmentation.  Popliteal Vein: No evidence of thrombus. Normal compressibility, respiratory phasicity and response to augmentation.  Calf Veins: No evidence of thrombus. Normal compressibility and flow on color Doppler imaging.  Superficial Great Saphenous Vein: No evidence of thrombus. Normal compressibility and flow on color Doppler imaging.  Venous Reflux:  None.  Other Findings:  None.  IMPRESSION: No evidence of deep venous thrombosis.   Electronically Signed By: Jacqulynn Cadet M.D. On: 02/21/2014 07:47  No results found for this or any previous visit.  No results found for this or any previous visit.  No results found for this or any previous visit.  No results found for this or any previous visit.  No results found for this or any previous visit.  No results found for this or any previous visit.   Assessment & Plan:    1. Recurrent UTI -urine for culture - Urinalysis, Routine w reflex microscopic - BLADDER SCAN AMB NON-IMAGING  2. OAB (overactive bladder) -restart mirabegron 25mg  daily -RTC 3 months   No follow-ups on file.  Nicolette Bang, MD  [facility] [place]

## 2020-05-21 NOTE — Progress Notes (Signed)
Pt request medication to be printed.

## 2020-05-21 NOTE — Patient Instructions (Signed)

## 2020-05-22 DIAGNOSIS — M069 Rheumatoid arthritis, unspecified: Secondary | ICD-10-CM | POA: Diagnosis not present

## 2020-05-22 DIAGNOSIS — M1711 Unilateral primary osteoarthritis, right knee: Secondary | ICD-10-CM | POA: Diagnosis not present

## 2020-05-22 DIAGNOSIS — G894 Chronic pain syndrome: Secondary | ICD-10-CM | POA: Diagnosis not present

## 2020-05-22 DIAGNOSIS — M6281 Muscle weakness (generalized): Secondary | ICD-10-CM | POA: Diagnosis not present

## 2020-05-22 DIAGNOSIS — M797 Fibromyalgia: Secondary | ICD-10-CM | POA: Diagnosis not present

## 2020-05-22 DIAGNOSIS — R2689 Other abnormalities of gait and mobility: Secondary | ICD-10-CM | POA: Diagnosis not present

## 2020-05-22 DIAGNOSIS — M25511 Pain in right shoulder: Secondary | ICD-10-CM | POA: Diagnosis not present

## 2020-05-23 DIAGNOSIS — R8279 Other abnormal findings on microbiological examination of urine: Secondary | ICD-10-CM | POA: Diagnosis not present

## 2020-05-23 DIAGNOSIS — M79675 Pain in left toe(s): Secondary | ICD-10-CM | POA: Diagnosis not present

## 2020-05-23 DIAGNOSIS — M1711 Unilateral primary osteoarthritis, right knee: Secondary | ICD-10-CM | POA: Diagnosis not present

## 2020-05-23 DIAGNOSIS — B351 Tinea unguium: Secondary | ICD-10-CM | POA: Diagnosis not present

## 2020-05-24 DIAGNOSIS — M069 Rheumatoid arthritis, unspecified: Secondary | ICD-10-CM | POA: Diagnosis not present

## 2020-05-24 DIAGNOSIS — G894 Chronic pain syndrome: Secondary | ICD-10-CM | POA: Diagnosis not present

## 2020-05-24 DIAGNOSIS — M25511 Pain in right shoulder: Secondary | ICD-10-CM | POA: Diagnosis not present

## 2020-05-24 DIAGNOSIS — M797 Fibromyalgia: Secondary | ICD-10-CM | POA: Diagnosis not present

## 2020-05-24 DIAGNOSIS — R2689 Other abnormalities of gait and mobility: Secondary | ICD-10-CM | POA: Diagnosis not present

## 2020-05-24 DIAGNOSIS — M1711 Unilateral primary osteoarthritis, right knee: Secondary | ICD-10-CM | POA: Diagnosis not present

## 2020-05-24 DIAGNOSIS — M6281 Muscle weakness (generalized): Secondary | ICD-10-CM | POA: Diagnosis not present

## 2020-05-25 DIAGNOSIS — M1711 Unilateral primary osteoarthritis, right knee: Secondary | ICD-10-CM | POA: Diagnosis not present

## 2020-05-25 DIAGNOSIS — M797 Fibromyalgia: Secondary | ICD-10-CM | POA: Diagnosis not present

## 2020-05-25 DIAGNOSIS — R2689 Other abnormalities of gait and mobility: Secondary | ICD-10-CM | POA: Diagnosis not present

## 2020-05-25 DIAGNOSIS — M25511 Pain in right shoulder: Secondary | ICD-10-CM | POA: Diagnosis not present

## 2020-05-25 DIAGNOSIS — G894 Chronic pain syndrome: Secondary | ICD-10-CM | POA: Diagnosis not present

## 2020-05-25 DIAGNOSIS — M6281 Muscle weakness (generalized): Secondary | ICD-10-CM | POA: Diagnosis not present

## 2020-05-25 DIAGNOSIS — M069 Rheumatoid arthritis, unspecified: Secondary | ICD-10-CM | POA: Diagnosis not present

## 2020-05-28 DIAGNOSIS — M069 Rheumatoid arthritis, unspecified: Secondary | ICD-10-CM | POA: Diagnosis not present

## 2020-05-28 DIAGNOSIS — G894 Chronic pain syndrome: Secondary | ICD-10-CM | POA: Diagnosis not present

## 2020-05-28 DIAGNOSIS — R2689 Other abnormalities of gait and mobility: Secondary | ICD-10-CM | POA: Diagnosis not present

## 2020-05-28 DIAGNOSIS — M6281 Muscle weakness (generalized): Secondary | ICD-10-CM | POA: Diagnosis not present

## 2020-05-28 DIAGNOSIS — M797 Fibromyalgia: Secondary | ICD-10-CM | POA: Diagnosis not present

## 2020-05-28 DIAGNOSIS — M1711 Unilateral primary osteoarthritis, right knee: Secondary | ICD-10-CM | POA: Diagnosis not present

## 2020-05-28 DIAGNOSIS — M25511 Pain in right shoulder: Secondary | ICD-10-CM | POA: Diagnosis not present

## 2020-05-29 DIAGNOSIS — M797 Fibromyalgia: Secondary | ICD-10-CM | POA: Diagnosis not present

## 2020-05-29 DIAGNOSIS — R2689 Other abnormalities of gait and mobility: Secondary | ICD-10-CM | POA: Diagnosis not present

## 2020-05-29 DIAGNOSIS — G894 Chronic pain syndrome: Secondary | ICD-10-CM | POA: Diagnosis not present

## 2020-05-29 DIAGNOSIS — M069 Rheumatoid arthritis, unspecified: Secondary | ICD-10-CM | POA: Diagnosis not present

## 2020-05-29 DIAGNOSIS — M6281 Muscle weakness (generalized): Secondary | ICD-10-CM | POA: Diagnosis not present

## 2020-05-29 DIAGNOSIS — M25511 Pain in right shoulder: Secondary | ICD-10-CM | POA: Diagnosis not present

## 2020-05-29 DIAGNOSIS — M1711 Unilateral primary osteoarthritis, right knee: Secondary | ICD-10-CM | POA: Diagnosis not present

## 2020-05-30 DIAGNOSIS — G894 Chronic pain syndrome: Secondary | ICD-10-CM | POA: Diagnosis not present

## 2020-05-30 DIAGNOSIS — M6281 Muscle weakness (generalized): Secondary | ICD-10-CM | POA: Diagnosis not present

## 2020-05-30 DIAGNOSIS — R2689 Other abnormalities of gait and mobility: Secondary | ICD-10-CM | POA: Diagnosis not present

## 2020-05-30 DIAGNOSIS — M069 Rheumatoid arthritis, unspecified: Secondary | ICD-10-CM | POA: Diagnosis not present

## 2020-05-30 DIAGNOSIS — M797 Fibromyalgia: Secondary | ICD-10-CM | POA: Diagnosis not present

## 2020-05-30 DIAGNOSIS — M25511 Pain in right shoulder: Secondary | ICD-10-CM | POA: Diagnosis not present

## 2020-05-30 DIAGNOSIS — M1711 Unilateral primary osteoarthritis, right knee: Secondary | ICD-10-CM | POA: Diagnosis not present

## 2020-05-31 DIAGNOSIS — G894 Chronic pain syndrome: Secondary | ICD-10-CM | POA: Diagnosis not present

## 2020-05-31 DIAGNOSIS — M797 Fibromyalgia: Secondary | ICD-10-CM | POA: Diagnosis not present

## 2020-05-31 DIAGNOSIS — R2689 Other abnormalities of gait and mobility: Secondary | ICD-10-CM | POA: Diagnosis not present

## 2020-05-31 DIAGNOSIS — M1711 Unilateral primary osteoarthritis, right knee: Secondary | ICD-10-CM | POA: Diagnosis not present

## 2020-05-31 DIAGNOSIS — M6281 Muscle weakness (generalized): Secondary | ICD-10-CM | POA: Diagnosis not present

## 2020-05-31 DIAGNOSIS — M25511 Pain in right shoulder: Secondary | ICD-10-CM | POA: Diagnosis not present

## 2020-05-31 DIAGNOSIS — M069 Rheumatoid arthritis, unspecified: Secondary | ICD-10-CM | POA: Diagnosis not present

## 2020-06-06 DIAGNOSIS — M1711 Unilateral primary osteoarthritis, right knee: Secondary | ICD-10-CM | POA: Diagnosis not present

## 2020-06-06 DIAGNOSIS — M25511 Pain in right shoulder: Secondary | ICD-10-CM | POA: Diagnosis not present

## 2020-06-06 DIAGNOSIS — G894 Chronic pain syndrome: Secondary | ICD-10-CM | POA: Diagnosis not present

## 2020-06-06 DIAGNOSIS — M069 Rheumatoid arthritis, unspecified: Secondary | ICD-10-CM | POA: Diagnosis not present

## 2020-06-06 DIAGNOSIS — M797 Fibromyalgia: Secondary | ICD-10-CM | POA: Diagnosis not present

## 2020-06-06 DIAGNOSIS — M6281 Muscle weakness (generalized): Secondary | ICD-10-CM | POA: Diagnosis not present

## 2020-06-06 DIAGNOSIS — R2689 Other abnormalities of gait and mobility: Secondary | ICD-10-CM | POA: Diagnosis not present

## 2020-06-22 DIAGNOSIS — K219 Gastro-esophageal reflux disease without esophagitis: Secondary | ICD-10-CM | POA: Diagnosis not present

## 2020-06-22 DIAGNOSIS — M797 Fibromyalgia: Secondary | ICD-10-CM | POA: Diagnosis not present

## 2020-06-22 DIAGNOSIS — E559 Vitamin D deficiency, unspecified: Secondary | ICD-10-CM | POA: Diagnosis not present

## 2020-06-22 DIAGNOSIS — I679 Cerebrovascular disease, unspecified: Secondary | ICD-10-CM | POA: Diagnosis not present

## 2020-06-22 DIAGNOSIS — F329 Major depressive disorder, single episode, unspecified: Secondary | ICD-10-CM | POA: Diagnosis not present

## 2020-06-22 DIAGNOSIS — M069 Rheumatoid arthritis, unspecified: Secondary | ICD-10-CM | POA: Diagnosis not present

## 2020-06-22 DIAGNOSIS — I10 Essential (primary) hypertension: Secondary | ICD-10-CM | POA: Diagnosis not present

## 2020-07-02 DIAGNOSIS — M79671 Pain in right foot: Secondary | ICD-10-CM | POA: Diagnosis not present

## 2020-07-02 DIAGNOSIS — G629 Polyneuropathy, unspecified: Secondary | ICD-10-CM | POA: Diagnosis not present

## 2020-07-02 DIAGNOSIS — M79672 Pain in left foot: Secondary | ICD-10-CM | POA: Diagnosis not present

## 2020-07-02 DIAGNOSIS — F329 Major depressive disorder, single episode, unspecified: Secondary | ICD-10-CM | POA: Diagnosis not present

## 2020-07-02 DIAGNOSIS — F411 Generalized anxiety disorder: Secondary | ICD-10-CM | POA: Diagnosis not present

## 2020-07-02 DIAGNOSIS — M069 Rheumatoid arthritis, unspecified: Secondary | ICD-10-CM | POA: Diagnosis not present

## 2020-07-02 DIAGNOSIS — M79641 Pain in right hand: Secondary | ICD-10-CM | POA: Diagnosis not present

## 2020-07-02 DIAGNOSIS — M130 Polyarthritis, unspecified: Secondary | ICD-10-CM | POA: Diagnosis not present

## 2020-07-02 DIAGNOSIS — M1990 Unspecified osteoarthritis, unspecified site: Secondary | ICD-10-CM | POA: Diagnosis not present

## 2020-07-02 DIAGNOSIS — E559 Vitamin D deficiency, unspecified: Secondary | ICD-10-CM | POA: Diagnosis not present

## 2020-07-02 DIAGNOSIS — M797 Fibromyalgia: Secondary | ICD-10-CM | POA: Diagnosis not present

## 2020-07-02 DIAGNOSIS — K219 Gastro-esophageal reflux disease without esophagitis: Secondary | ICD-10-CM | POA: Diagnosis not present

## 2020-07-02 DIAGNOSIS — Z8739 Personal history of other diseases of the musculoskeletal system and connective tissue: Secondary | ICD-10-CM | POA: Diagnosis not present

## 2020-07-02 DIAGNOSIS — M79643 Pain in unspecified hand: Secondary | ICD-10-CM | POA: Diagnosis not present

## 2020-07-02 DIAGNOSIS — R252 Cramp and spasm: Secondary | ICD-10-CM | POA: Diagnosis not present

## 2020-07-02 DIAGNOSIS — M791 Myalgia, unspecified site: Secondary | ICD-10-CM | POA: Diagnosis not present

## 2020-07-02 DIAGNOSIS — M79642 Pain in left hand: Secondary | ICD-10-CM | POA: Diagnosis not present

## 2020-07-02 DIAGNOSIS — F419 Anxiety disorder, unspecified: Secondary | ICD-10-CM | POA: Diagnosis not present

## 2020-07-03 DIAGNOSIS — Z5181 Encounter for therapeutic drug level monitoring: Secondary | ICD-10-CM | POA: Diagnosis not present

## 2020-07-03 DIAGNOSIS — N184 Chronic kidney disease, stage 4 (severe): Secondary | ICD-10-CM | POA: Diagnosis not present

## 2020-07-03 DIAGNOSIS — E039 Hypothyroidism, unspecified: Secondary | ICD-10-CM | POA: Diagnosis not present

## 2020-07-04 DIAGNOSIS — I2510 Atherosclerotic heart disease of native coronary artery without angina pectoris: Secondary | ICD-10-CM | POA: Diagnosis not present

## 2020-07-04 DIAGNOSIS — N184 Chronic kidney disease, stage 4 (severe): Secondary | ICD-10-CM | POA: Diagnosis not present

## 2020-07-04 DIAGNOSIS — M1990 Unspecified osteoarthritis, unspecified site: Secondary | ICD-10-CM | POA: Diagnosis not present

## 2020-07-04 DIAGNOSIS — D509 Iron deficiency anemia, unspecified: Secondary | ICD-10-CM | POA: Diagnosis not present

## 2020-07-05 DIAGNOSIS — F329 Major depressive disorder, single episode, unspecified: Secondary | ICD-10-CM | POA: Diagnosis not present

## 2020-07-05 DIAGNOSIS — K219 Gastro-esophageal reflux disease without esophagitis: Secondary | ICD-10-CM | POA: Diagnosis not present

## 2020-07-05 DIAGNOSIS — E559 Vitamin D deficiency, unspecified: Secondary | ICD-10-CM | POA: Diagnosis not present

## 2020-07-05 DIAGNOSIS — M797 Fibromyalgia: Secondary | ICD-10-CM | POA: Diagnosis not present

## 2020-07-05 DIAGNOSIS — F419 Anxiety disorder, unspecified: Secondary | ICD-10-CM | POA: Diagnosis not present

## 2020-07-05 DIAGNOSIS — I10 Essential (primary) hypertension: Secondary | ICD-10-CM | POA: Diagnosis not present

## 2020-07-20 DIAGNOSIS — G478 Other sleep disorders: Secondary | ICD-10-CM | POA: Diagnosis not present

## 2020-07-20 DIAGNOSIS — F411 Generalized anxiety disorder: Secondary | ICD-10-CM | POA: Diagnosis not present

## 2020-07-20 DIAGNOSIS — F331 Major depressive disorder, recurrent, moderate: Secondary | ICD-10-CM | POA: Diagnosis not present

## 2020-07-25 DIAGNOSIS — F411 Generalized anxiety disorder: Secondary | ICD-10-CM | POA: Diagnosis not present

## 2020-07-31 DIAGNOSIS — F411 Generalized anxiety disorder: Secondary | ICD-10-CM | POA: Diagnosis not present

## 2020-08-01 DIAGNOSIS — M79643 Pain in unspecified hand: Secondary | ICD-10-CM | POA: Diagnosis not present

## 2020-08-01 DIAGNOSIS — M79671 Pain in right foot: Secondary | ICD-10-CM | POA: Diagnosis not present

## 2020-08-01 DIAGNOSIS — M791 Myalgia, unspecified site: Secondary | ICD-10-CM | POA: Diagnosis not present

## 2020-08-01 DIAGNOSIS — Z8739 Personal history of other diseases of the musculoskeletal system and connective tissue: Secondary | ICD-10-CM | POA: Diagnosis not present

## 2020-08-01 DIAGNOSIS — M1990 Unspecified osteoarthritis, unspecified site: Secondary | ICD-10-CM | POA: Diagnosis not present

## 2020-08-01 DIAGNOSIS — M797 Fibromyalgia: Secondary | ICD-10-CM | POA: Diagnosis not present

## 2020-08-01 DIAGNOSIS — M130 Polyarthritis, unspecified: Secondary | ICD-10-CM | POA: Diagnosis not present

## 2020-08-01 DIAGNOSIS — Z79899 Other long term (current) drug therapy: Secondary | ICD-10-CM | POA: Diagnosis not present

## 2020-08-06 DIAGNOSIS — F411 Generalized anxiety disorder: Secondary | ICD-10-CM | POA: Diagnosis not present

## 2020-08-07 DIAGNOSIS — M069 Rheumatoid arthritis, unspecified: Secondary | ICD-10-CM | POA: Diagnosis not present

## 2020-08-07 DIAGNOSIS — K219 Gastro-esophageal reflux disease without esophagitis: Secondary | ICD-10-CM | POA: Diagnosis not present

## 2020-08-07 DIAGNOSIS — E559 Vitamin D deficiency, unspecified: Secondary | ICD-10-CM | POA: Diagnosis not present

## 2020-08-08 DIAGNOSIS — M79674 Pain in right toe(s): Secondary | ICD-10-CM | POA: Diagnosis not present

## 2020-08-08 DIAGNOSIS — B351 Tinea unguium: Secondary | ICD-10-CM | POA: Diagnosis not present

## 2020-08-08 DIAGNOSIS — M79675 Pain in left toe(s): Secondary | ICD-10-CM | POA: Diagnosis not present

## 2020-08-16 DIAGNOSIS — F411 Generalized anxiety disorder: Secondary | ICD-10-CM | POA: Diagnosis not present

## 2020-08-20 ENCOUNTER — Ambulatory Visit: Payer: Medicare PPO | Admitting: Urology

## 2020-08-20 DIAGNOSIS — N390 Urinary tract infection, site not specified: Secondary | ICD-10-CM

## 2020-08-23 DIAGNOSIS — F411 Generalized anxiety disorder: Secondary | ICD-10-CM | POA: Diagnosis not present

## 2020-08-23 DIAGNOSIS — M25562 Pain in left knee: Secondary | ICD-10-CM | POA: Diagnosis not present

## 2020-08-27 DIAGNOSIS — M1712 Unilateral primary osteoarthritis, left knee: Secondary | ICD-10-CM | POA: Diagnosis not present

## 2020-08-27 DIAGNOSIS — M1711 Unilateral primary osteoarthritis, right knee: Secondary | ICD-10-CM | POA: Diagnosis not present

## 2020-08-29 DIAGNOSIS — F4321 Adjustment disorder with depressed mood: Secondary | ICD-10-CM | POA: Diagnosis not present

## 2020-08-29 DIAGNOSIS — F411 Generalized anxiety disorder: Secondary | ICD-10-CM | POA: Diagnosis not present

## 2020-09-06 DIAGNOSIS — F411 Generalized anxiety disorder: Secondary | ICD-10-CM | POA: Diagnosis not present

## 2020-09-13 DIAGNOSIS — J302 Other seasonal allergic rhinitis: Secondary | ICD-10-CM | POA: Diagnosis not present

## 2020-09-13 DIAGNOSIS — Z8709 Personal history of other diseases of the respiratory system: Secondary | ICD-10-CM | POA: Diagnosis not present

## 2020-09-13 DIAGNOSIS — F411 Generalized anxiety disorder: Secondary | ICD-10-CM | POA: Diagnosis not present

## 2020-09-13 DIAGNOSIS — H66001 Acute suppurative otitis media without spontaneous rupture of ear drum, right ear: Secondary | ICD-10-CM | POA: Diagnosis not present

## 2020-09-19 DIAGNOSIS — B1710 Acute hepatitis C without hepatic coma: Secondary | ICD-10-CM | POA: Diagnosis not present

## 2020-09-19 DIAGNOSIS — D509 Iron deficiency anemia, unspecified: Secondary | ICD-10-CM | POA: Diagnosis not present

## 2020-09-19 DIAGNOSIS — I2510 Atherosclerotic heart disease of native coronary artery without angina pectoris: Secondary | ICD-10-CM | POA: Diagnosis not present

## 2020-09-19 DIAGNOSIS — M08272 Juvenile rheumatoid arthritis with systemic onset, left ankle and foot: Secondary | ICD-10-CM | POA: Diagnosis not present

## 2020-09-19 DIAGNOSIS — N184 Chronic kidney disease, stage 4 (severe): Secondary | ICD-10-CM | POA: Diagnosis not present

## 2020-09-20 DIAGNOSIS — F411 Generalized anxiety disorder: Secondary | ICD-10-CM | POA: Diagnosis not present

## 2020-09-26 DIAGNOSIS — R4589 Other symptoms and signs involving emotional state: Secondary | ICD-10-CM | POA: Diagnosis not present

## 2020-09-26 DIAGNOSIS — F329 Major depressive disorder, single episode, unspecified: Secondary | ICD-10-CM | POA: Diagnosis not present

## 2020-09-26 DIAGNOSIS — F419 Anxiety disorder, unspecified: Secondary | ICD-10-CM | POA: Diagnosis not present

## 2020-10-01 DIAGNOSIS — F329 Major depressive disorder, single episode, unspecified: Secondary | ICD-10-CM | POA: Diagnosis not present

## 2020-10-01 DIAGNOSIS — M797 Fibromyalgia: Secondary | ICD-10-CM | POA: Diagnosis not present

## 2020-10-01 DIAGNOSIS — M069 Rheumatoid arthritis, unspecified: Secondary | ICD-10-CM | POA: Diagnosis not present

## 2020-10-01 DIAGNOSIS — I10 Essential (primary) hypertension: Secondary | ICD-10-CM | POA: Diagnosis not present

## 2020-10-01 DIAGNOSIS — M159 Polyosteoarthritis, unspecified: Secondary | ICD-10-CM | POA: Diagnosis not present

## 2020-10-01 DIAGNOSIS — F419 Anxiety disorder, unspecified: Secondary | ICD-10-CM | POA: Diagnosis not present

## 2020-10-01 DIAGNOSIS — K219 Gastro-esophageal reflux disease without esophagitis: Secondary | ICD-10-CM | POA: Diagnosis not present

## 2020-10-01 DIAGNOSIS — G629 Polyneuropathy, unspecified: Secondary | ICD-10-CM | POA: Diagnosis not present

## 2020-10-05 ENCOUNTER — Encounter: Payer: Self-pay | Admitting: Urology

## 2020-10-05 ENCOUNTER — Other Ambulatory Visit: Payer: Self-pay

## 2020-10-05 ENCOUNTER — Telehealth (INDEPENDENT_AMBULATORY_CARE_PROVIDER_SITE_OTHER): Payer: Medicare PPO | Admitting: Urology

## 2020-10-05 DIAGNOSIS — N3281 Overactive bladder: Secondary | ICD-10-CM

## 2020-10-05 DIAGNOSIS — N390 Urinary tract infection, site not specified: Secondary | ICD-10-CM

## 2020-10-05 DIAGNOSIS — G478 Other sleep disorders: Secondary | ICD-10-CM | POA: Diagnosis not present

## 2020-10-05 DIAGNOSIS — F331 Major depressive disorder, recurrent, moderate: Secondary | ICD-10-CM | POA: Diagnosis not present

## 2020-10-05 DIAGNOSIS — F411 Generalized anxiety disorder: Secondary | ICD-10-CM | POA: Diagnosis not present

## 2020-10-05 NOTE — Progress Notes (Deleted)
10/05/2020 2:18 PM   Robin Blackburn Mar 15, 1946 [id number]  Referring provider: [employer], [place],  [place] [address]  Patient location: home Physician location: office I connected with  Robin Blackburn on 10/05/20 by a video enabled telemedicine application and verified that I am speaking with the correct person using two identifiers.   I discussed the limitations of evaluation and management by telemedicine. The patient expressed understanding and agreed to proceed.    Followup OAB and recurrent UTI  HPI:    PMH: Past Medical History:  Diagnosis Date  . Acid reflux   . Anxiety   . 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 Depression   . Fibromyalgia   . Hypertension   . Neuropathy   . Rheumatoid arthritis ([place])   . Stroke [facility]) 02/17/14    Surgical History: Past Surgical History:  Procedure Laterality Date  . ABDOMINAL HYSTERECTOMY    . ANKLE RECONSTRUCTION    . APPENDECTOMY    . BACK SURGERY    . CHOLECYSTECTOMY    . KNEE SURGERY      Home Medications:  Allergies as of 10/05/2020      Reactions   Demerol [meperidine] Anaphylaxis   Hydromorphone Anaphylaxis   Keflex [cephalexin] Nausea And Vomiting   Penicillins Anaphylaxis   Has patient had a PCN reaction causing immediate rash, facial/tongue/throat swelling, SOB or lightheadedness with hypotension: Yes Has patient had a PCN reaction causing severe rash involving mucus membranes or skin necrosis: No Has patient had a PCN reaction that required hospitalization No Has patient had a PCN reaction occurring within the last 10 years: No If all of the above answers are "NO", then may proceed with Cephalosporin use.   Sulfa Antibiotics Nausea And Vomiting   Bisphosphonates    GI intolerance   Codeine    Fentanyl Other (See Comments)   "felt like I had demons in my head"   Iohexol    Percocet [oxycodone-acetaminophen] Swelling   Mouth swelling.   Shellfish  Allergy    Glucosamine not an option   Terramycin [oxytetracycline] Nausea And Vomiting   Ciprofloxacin Nausea Only, Rash   Levaquin [levofloxacin In D5w] Nausea And Vomiting, Rash   Morphine And Related Rash      Medication List       Accurate as of October 05, 2020  2:18 PM. If you have any questions, ask your nurse or doctor.        acetaminophen 500 MG tablet Commonly known as: TYLENOL Take 500 mg by mouth every 6 (six) hours as needed for mild pain or moderate pain.   ALPRAZolam 0.5 MG tablet Commonly known as: XANAX Take 1 tablet (0.5 mg total) by mouth 3 (three) times daily as needed for anxiety.   benzocaine 10 % mucosal gel Commonly known as: ORAJEL Use as directed 1 application in the mouth or throat as needed for mouth pain.   busPIRone 15 MG tablet Commonly known as: BUSPAR Take 15 mg by mouth 2 (two) times daily.   chlorhexidine 0.12 % solution Commonly known as: PERIDEX Use as directed 15 mLs in the mouth or throat 2 (two) times daily.   Cholecalciferol 125 MCG (5000 UT) Tabs Take 1 tablet by mouth daily.   clobetasol cream 0.05 % Commonly known as: TEMOVATE Apply 1 application topically 2 (two) times daily.   clopidogrel 75 MG tablet Commonly known as: PLAVIX Take 75 mg by mouth at bedtime.   desloratadine  5 MG tablet Commonly known as: CLARINEX Take 5 mg by mouth every morning.   dicyclomine 20 MG tablet Commonly known as: BENTYL Take 20 mg by mouth every 6 (six) hours. Abdominal cramps   Ensure Max Protein Liqd Take 330 mLs (11 oz total) by mouth 2 (two) times daily.   gabapentin 300 MG capsule Commonly known as: NEURONTIN Take 300 mg by mouth 3 (three) times daily.   HYDROcodone-acetaminophen 5-325 MG tablet Commonly known as: NORCO/VICODIN Take 1 tablet by mouth every 6 (six) hours as needed for severe pain.   hydroxychloroquine 200 MG tablet Commonly known as: PLAQUENIL Take 200 mg by mouth daily.   lansoprazole 30 MG  capsule Commonly known as: PREVACID Take 30 mg by mouth 2 (two) times daily.   lidocaine 5 % Commonly known as: LIDODERM Place 1 patch onto the skin daily. Remove & Discard patch within 12 hours or as directed by MD   loperamide 2 MG tablet Commonly known as: IMODIUM A-D Take 2 mg by mouth 4 (four) times daily as needed for diarrhea or loose stools.   Melatonin 2.5 MG Caps Take by mouth 2 (two) times daily.   methocarbamol 750 MG tablet Commonly known as: ROBAXIN Take 1 tablet (750 mg total) by mouth every 6 (six) hours as needed for muscle spasms.   metoprolol tartrate 25 MG tablet Commonly known as: LOPRESSOR Take 1 tablet (25 mg total) by mouth 2 (two) times daily.   mirabegron ER 25 MG Tb24 tablet Commonly known as: MYRBETRIQ Take 1 tablet (25 mg total) by mouth daily.   nitrofurantoin (macrocrystal-monohydrate) 100 MG capsule Commonly known as: MACROBID Take 1 capsule (100 mg total) by mouth every 12 (twelve) hours.   ondansetron 4 MG tablet Commonly known as: ZOFRAN Take 4 mg by mouth every 6 (six) hours as needed for nausea or vomiting.   Propylene Glycol 0.6 % Soln Place 1 drop into both eyes daily. At noon.   QUEtiapine 25 MG tablet Commonly known as: SEROQUEL Take 0.5 tablets (12.5 mg total) by mouth 2 (two) times daily.   Restasis 0.05 % ophthalmic emulsion Generic drug: cycloSPORINE Place 1 drop into both eyes 2 (two) times daily.   saccharomyces boulardii 250 MG capsule Commonly known as: FLORASTOR Take 250 mg by mouth 2 (two) times daily.   simethicone 125 MG chewable tablet Commonly known as: MYLICON Chew 0000000 mg by mouth every 6 (six) hours as needed for flatulence.   sodium chloride 0.65 % Soln nasal spray Commonly known as: OCEAN Place 1 spray into both nostrils as needed for congestion. What changed: when to take this   ticagrelor 90 MG Tabs tablet Commonly known as: BRILINTA Take by mouth 2 (two) times daily.   traZODone 50 MG  tablet Commonly known as: DESYREL Take 50 mg by mouth at bedtime.   triamcinolone cream 0.5 % Commonly known as: KENALOG Apply topically 2 (two) times daily. Apply to affected area twice daily as directed (left ankle area)   trolamine salicylate 10 % cream Commonly known as: ASPERCREME Apply 1 application topically as needed for muscle pain.       Allergies:  Allergies  Allergen Reactions  . Demerol [Meperidine] Anaphylaxis  . Hydromorphone Anaphylaxis  . Keflex [Cephalexin] Nausea And Vomiting  . Penicillins Anaphylaxis    Has patient had a PCN reaction causing immediate rash, facial/tongue/throat swelling, SOB or lightheadedness with hypotension: Yes Has patient had a PCN reaction causing severe rash involving mucus membranes or skin necrosis:  No Has patient had a PCN reaction that required hospitalization No Has patient had a PCN reaction occurring within the last 10 years: No If all of the above answers are "NO", then may proceed with Cephalosporin use.   . Sulfa Antibiotics Nausea And Vomiting  . Bisphosphonates     GI intolerance  . Codeine   . Fentanyl Other (See Comments)    "felt like I had demons in my head"  . Iohexol   . Percocet [Oxycodone-Acetaminophen] Swelling    Mouth swelling.  . Shellfish Allergy     Glucosamine not an option  . Terramycin [Oxytetracycline] Nausea And Vomiting  . Ciprofloxacin Nausea Only and Rash  . Levaquin [Levofloxacin In D5w] Nausea And Vomiting and Rash  . Morphine And Related Rash    Family History: Family History  Problem Relation Age of Onset  . Hypertension Father   . Transient ischemic attack Father   . Hypertension Brother   . CVA Maternal Grandfather   . Leukemia Brother     Social History:  reports that she has never smoked. She has never used smokeless tobacco. She reports that she does not drink alcohol and does not use drugs.  ROS: All other review of systems were reviewed and are negative except what is  noted above in HPI  Physical Exam: There were no vitals taken for this visit.  Constitutional:  Alert and oriented, No acute distress. HEENT: [place] AT, moist mucus membranes.  Trachea midline, no masses. Cardiovascular: No clubbing, cyanosis, or edema. Respiratory: Normal respiratory effort, no increased work of breathing. GI: Abdomen is soft, nontender, nondistended, no abdominal masses GU: No CVA tenderness.  Lymph: No cervical or inguinal lymphadenopathy. Skin: No rashes, bruises or suspicious lesions. Neurologic: Grossly intact, no focal deficits, moving all 4 extremities. Psychiatric: Normal mood and affect.  Laboratory Data: Lab Results  Component Value Date   WBC 11.3 (H) 08/23/2018   HGB 12.6 08/23/2018   HCT 41.5 08/23/2018   MCV 95.8 08/23/2018   PLT 422 (H) 08/23/2018    Lab Results  Component Value Date   CREATININE 0.78 08/23/2018    No results found for: PSA  No results found for: TESTOSTERONE  Lab Results  Component Value Date   HGBA1C 6.1 (H) 08/15/2015    Urinalysis    Component Value Date/Time   COLORURINE STRAW (A) 08/20/2018 1225   APPEARANCEUR CLEAR 08/20/2018 1225   LABSPEC 1.004 (L) 08/20/2018 1225   PHURINE 6.0 08/20/2018 1225   GLUCOSEU NEGATIVE 08/20/2018 1225   HGBUR MODERATE (A) 08/20/2018 1225   BILIRUBINUR NEGATIVE 08/20/2018 1225   KETONESUR NEGATIVE 08/20/2018 1225   PROTEINUR NEGATIVE 08/20/2018 1225   UROBILINOGEN 0.2 04/10/2015 2212   NITRITE NEGATIVE 08/20/2018 1225   LEUKOCYTESUR NEGATIVE 08/20/2018 1225    Lab Results  Component Value Date   BACTERIA NONE SEEN 08/20/2018    Pertinent Imaging: *** No results found for this or any previous visit.  Results for orders placed during the hospital encounter of 02/19/14  US Venous Img Lower Bilateral  Narrative CLINICAL DATA:  Bilateral lower extremity pain and swelling  EXAM: BILATERAL LOWER EXTREMITY VENOUS DOPPLER ULTRASOUND  TECHNIQUE: Gray-scale sonography  with graded compression, as well as color Doppler and duplex ultrasound were performed to evaluate the lower extremity deep venous systems from the level of the common femoral vein and including the common femoral, femoral, profunda femoral, popliteal and calf veins including the posterior tibial, peroneal and gastrocnemius veins when visible. The superficial great  saphenous vein was also interrogated. Spectral Doppler was utilized to evaluate flow at rest and with distal augmentation maneuvers in the common femoral, femoral and popliteal veins.  COMPARISON:  Prior left lower extremity duplex venous ultrasound 02/24/2008  FINDINGS: RIGHT LOWER EXTREMITY  Common Femoral Vein: No evidence of thrombus. Normal compressibility, respiratory phasicity and response to augmentation.  Saphenofemoral Junction: No evidence of thrombus. Normal compressibility and flow on color Doppler imaging.  Profunda Femoral Vein: No evidence of thrombus. Normal compressibility and flow on color Doppler imaging.  Femoral Vein: No evidence of thrombus. Normal compressibility, respiratory phasicity and response to augmentation.  Popliteal Vein: No evidence of thrombus. Normal compressibility, respiratory phasicity and response to augmentation.  Calf Veins: No evidence of thrombus. Normal compressibility and flow on color Doppler imaging.  Superficial Great Saphenous Vein: No evidence of thrombus. Normal compressibility and flow on color Doppler imaging.  Venous Reflux:  None.  Other Findings:  None.  LEFT LOWER EXTREMITY  Common Femoral Vein: No evidence of thrombus. Normal compressibility, respiratory phasicity and response to augmentation.  Saphenofemoral Junction: No evidence of thrombus. Normal compressibility and flow on color Doppler imaging.  Profunda Femoral Vein: No evidence of thrombus. Normal compressibility and flow on color Doppler imaging.  Femoral Vein: No evidence of thrombus.  Normal compressibility, respiratory phasicity and response to augmentation.  Popliteal Vein: No evidence of thrombus. Normal compressibility, respiratory phasicity and response to augmentation.  Calf Veins: No evidence of thrombus. Normal compressibility and flow on color Doppler imaging.  Superficial Great Saphenous Vein: No evidence of thrombus. Normal compressibility and flow on color Doppler imaging.  Venous Reflux:  None.  Other Findings:  None.  IMPRESSION: No evidence of deep venous thrombosis.   Electronically Signed By: Jacqulynn Cadet M.D. On: 02/21/2014 07:47  No results found for this or any previous visit.  No results found for this or any previous visit.  No results found for this or any previous visit.  No results found for this or any previous visit.  No results found for this or any previous visit.  No results found for this or any previous visit.   Assessment & Plan:    1. Recurrent UTI ***  2. OAB (overactive bladder) ***   No follow-ups on file.  Nicolette Bang, MD  [facility] [place]

## 2020-10-05 NOTE — Patient Instructions (Signed)
Overactive Bladder, Adult  Overactive bladder is a condition in which a person has a sudden and frequent need to urinate. A person might also leak urine if he or she cannot get to the bathroom fast enough (urinary incontinence). Sometimes, symptoms can interfere with work or social activities. What are the causes? Overactive bladder is associated with poor nerve signals between your bladder and your brain. Your bladder may get the signal to empty before it is full. You may also have very sensitive muscles that make your bladder squeeze too soon. This condition may also be caused by other factors, such as:  Medical conditions: ? Urinary tract infection. ? Infection of nearby tissues. ? Prostate enlargement. ? Bladder stones, inflammation, or tumors. ? Diabetes. ? Muscle or nerve weakness, especially from these conditions:  A spinal cord injury.  Stroke.  Multiple sclerosis.  Parkinson's disease.  Other causes: ? Surgery on the uterus or urethra. ? Drinking too much caffeine or alcohol. ? Certain medicines, especially those that eliminate extra fluid in the body (diuretics). ? Constipation. What increases the risk? You may be at greater risk for overactive bladder if you:  Are an older adult.  Smoke.  Are going through menopause.  Have prostate problems.  Have a neurological disease, such as stroke, dementia, Parkinson's disease, or multiple sclerosis (MS).  Eat or drink alcohol, spicy food, caffeine, and other things that irritate the bladder.  Are overweight or obese. What are the signs or symptoms? Symptoms of this condition include a sudden, strong urge to urinate. Other symptoms include:  Leaking urine.  Urinating 8 or more times a day.  Waking up to urinate 2 or more times overnight. How is this diagnosed? This condition may be diagnosed based on:  Your symptoms and medical history.  A physical exam.  Blood or urine tests to check for possible causes,  such as infection. You may also need to see a health care provider who specializes in urinary tract problems. This is called a urologist. How is this treated? Treatment for overactive bladder depends on the cause of your condition and whether it is mild or severe. Treatment may include:  Bladder training, such as: ? Learning to control the urge to urinate by following a schedule to urinate at regular intervals. ? Doing Kegel exercises to strengthen the pelvic floor muscles that support your bladder.  Special devices, such as: ? Biofeedback. This uses sensors to help you become aware of your body's signals. ? Electrical stimulation. This uses electrodes placed inside the body (implanted) or outside the body. These electrodes send gentle pulses of electricity to strengthen the nerves or muscles that control the bladder. ? Women may use a plastic device, called a pessary, that fits into the vagina and supports the bladder.  Medicines, such as: ? Antibiotics to treat bladder infection. ? Antispasmodics to stop the bladder from releasing urine at the wrong time. ? Tricyclic antidepressants to relax bladder muscles. ? Injections of botulinum toxin type A directly into the bladder tissue to relax bladder muscles.  Surgery, such as: ? A device may be implanted to help manage the nerve signals that control urination. ? An electrode may be implanted to stimulate electrical signals in the bladder. ? A procedure may be done to change the shape of the bladder. This is done only in very severe cases. Follow these instructions at home: Eating and drinking  Make diet or lifestyle changes recommended by your health care provider. These may include: ? Drinking fluids   throughout the day and not only with meals. ? Cutting down on caffeine or alcohol. ? Eating a healthy and balanced diet to prevent constipation. This may include:  Choosing foods that are high in fiber, such as beans, whole grains, and  fresh fruits and vegetables.  Limiting foods that are high in fat and processed sugars, such as fried and sweet foods.   Lifestyle  Lose weight if needed.  Do not use any products that contain nicotine or tobacco. These include cigarettes, chewing tobacco, and vaping devices, such as e-cigarettes. If you need help quitting, ask your health care provider.   General instructions  Take over-the-counter and prescription medicines only as told by your health care provider.  If you were prescribed an antibiotic medicine, take it as told by your health care provider. Do not stop taking the antibiotic even if you start to feel better.  Use any implants or pessary as told by your health care provider.  If needed, wear pads to absorb urine leakage.  Keep a log to track how much and when you drink, and when you need to urinate. This will help your health care provider monitor your condition.  Keep all follow-up visits. This is important. Contact a health care provider if:  You have a fever or chills.  Your symptoms do not get better with treatment.  Your pain and discomfort get worse.  You have more frequent urges to urinate. Get help right away if:  You are not able to control your bladder. Summary  Overactive bladder refers to a condition in which a person has a sudden and frequent need to urinate.  Several conditions may lead to an overactive bladder.  Treatment for overactive bladder depends on the cause and severity of your condition.  Making lifestyle changes, doing Kegel exercises, keeping a log, and taking medicines can help with this condition. This information is not intended to replace advice given to you by your health care provider. Make sure you discuss any questions you have with your health care provider. Document Revised: 02/13/2020 Document Reviewed: 02/13/2020 Elsevier Patient Education  2021 [employer]  

## 2020-10-14 DIAGNOSIS — M19072 Primary osteoarthritis, left ankle and foot: Secondary | ICD-10-CM | POA: Diagnosis not present

## 2020-10-14 DIAGNOSIS — M7989 Other specified soft tissue disorders: Secondary | ICD-10-CM | POA: Diagnosis not present

## 2020-10-14 DIAGNOSIS — M25572 Pain in left ankle and joints of left foot: Secondary | ICD-10-CM | POA: Diagnosis not present

## 2020-10-16 NOTE — Progress Notes (Signed)
Patient not seen today

## 2020-10-23 DIAGNOSIS — F411 Generalized anxiety disorder: Secondary | ICD-10-CM | POA: Diagnosis not present

## 2020-10-24 DIAGNOSIS — M791 Myalgia, unspecified site: Secondary | ICD-10-CM | POA: Diagnosis not present

## 2020-10-24 DIAGNOSIS — M79643 Pain in unspecified hand: Secondary | ICD-10-CM | POA: Diagnosis not present

## 2020-10-24 DIAGNOSIS — M130 Polyarthritis, unspecified: Secondary | ICD-10-CM | POA: Diagnosis not present

## 2020-10-24 DIAGNOSIS — M797 Fibromyalgia: Secondary | ICD-10-CM | POA: Diagnosis not present

## 2020-10-24 DIAGNOSIS — M79671 Pain in right foot: Secondary | ICD-10-CM | POA: Diagnosis not present

## 2020-10-24 DIAGNOSIS — Z8739 Personal history of other diseases of the musculoskeletal system and connective tissue: Secondary | ICD-10-CM | POA: Diagnosis not present

## 2020-10-24 DIAGNOSIS — Z79899 Other long term (current) drug therapy: Secondary | ICD-10-CM | POA: Diagnosis not present

## 2020-10-24 DIAGNOSIS — M1990 Unspecified osteoarthritis, unspecified site: Secondary | ICD-10-CM | POA: Diagnosis not present

## 2020-10-25 DIAGNOSIS — F329 Major depressive disorder, single episode, unspecified: Secondary | ICD-10-CM | POA: Diagnosis not present

## 2020-10-25 DIAGNOSIS — M797 Fibromyalgia: Secondary | ICD-10-CM | POA: Diagnosis not present

## 2020-10-25 DIAGNOSIS — F419 Anxiety disorder, unspecified: Secondary | ICD-10-CM | POA: Diagnosis not present

## 2020-10-25 DIAGNOSIS — K219 Gastro-esophageal reflux disease without esophagitis: Secondary | ICD-10-CM | POA: Diagnosis not present

## 2020-10-25 DIAGNOSIS — Z79899 Other long term (current) drug therapy: Secondary | ICD-10-CM | POA: Diagnosis not present

## 2020-10-25 DIAGNOSIS — G629 Polyneuropathy, unspecified: Secondary | ICD-10-CM | POA: Diagnosis not present

## 2020-10-25 DIAGNOSIS — I10 Essential (primary) hypertension: Secondary | ICD-10-CM | POA: Diagnosis not present

## 2020-10-25 DIAGNOSIS — G2581 Restless legs syndrome: Secondary | ICD-10-CM | POA: Diagnosis not present

## 2020-11-01 DIAGNOSIS — F411 Generalized anxiety disorder: Secondary | ICD-10-CM | POA: Diagnosis not present

## 2020-11-08 DIAGNOSIS — Z79899 Other long term (current) drug therapy: Secondary | ICD-10-CM | POA: Diagnosis not present

## 2020-11-08 DIAGNOSIS — G2581 Restless legs syndrome: Secondary | ICD-10-CM | POA: Diagnosis not present

## 2020-11-08 DIAGNOSIS — G629 Polyneuropathy, unspecified: Secondary | ICD-10-CM | POA: Diagnosis not present

## 2020-11-08 DIAGNOSIS — F419 Anxiety disorder, unspecified: Secondary | ICD-10-CM | POA: Diagnosis not present

## 2020-11-08 DIAGNOSIS — M797 Fibromyalgia: Secondary | ICD-10-CM | POA: Diagnosis not present

## 2020-11-24 DIAGNOSIS — F411 Generalized anxiety disorder: Secondary | ICD-10-CM | POA: Diagnosis not present

## 2020-11-26 DIAGNOSIS — M069 Rheumatoid arthritis, unspecified: Secondary | ICD-10-CM | POA: Diagnosis not present

## 2020-11-26 DIAGNOSIS — G629 Polyneuropathy, unspecified: Secondary | ICD-10-CM | POA: Diagnosis not present

## 2020-11-26 DIAGNOSIS — Z79899 Other long term (current) drug therapy: Secondary | ICD-10-CM | POA: Diagnosis not present

## 2020-11-26 DIAGNOSIS — F419 Anxiety disorder, unspecified: Secondary | ICD-10-CM | POA: Diagnosis not present

## 2020-11-26 DIAGNOSIS — M797 Fibromyalgia: Secondary | ICD-10-CM | POA: Diagnosis not present

## 2020-11-27 DIAGNOSIS — E559 Vitamin D deficiency, unspecified: Secondary | ICD-10-CM | POA: Diagnosis not present

## 2020-11-27 DIAGNOSIS — M797 Fibromyalgia: Secondary | ICD-10-CM | POA: Diagnosis not present

## 2020-11-27 DIAGNOSIS — I679 Cerebrovascular disease, unspecified: Secondary | ICD-10-CM | POA: Diagnosis not present

## 2020-11-30 DIAGNOSIS — F411 Generalized anxiety disorder: Secondary | ICD-10-CM | POA: Diagnosis not present

## 2020-12-19 DIAGNOSIS — F411 Generalized anxiety disorder: Secondary | ICD-10-CM | POA: Diagnosis not present

## 2021-01-04 DIAGNOSIS — F411 Generalized anxiety disorder: Secondary | ICD-10-CM | POA: Diagnosis not present

## 2021-01-09 DIAGNOSIS — F411 Generalized anxiety disorder: Secondary | ICD-10-CM | POA: Diagnosis not present

## 2021-01-21 DIAGNOSIS — F329 Major depressive disorder, single episode, unspecified: Secondary | ICD-10-CM | POA: Diagnosis not present

## 2021-01-21 DIAGNOSIS — F419 Anxiety disorder, unspecified: Secondary | ICD-10-CM | POA: Diagnosis not present

## 2021-01-21 DIAGNOSIS — K219 Gastro-esophageal reflux disease without esophagitis: Secondary | ICD-10-CM | POA: Diagnosis not present

## 2021-01-21 DIAGNOSIS — M797 Fibromyalgia: Secondary | ICD-10-CM | POA: Diagnosis not present

## 2021-01-21 DIAGNOSIS — E559 Vitamin D deficiency, unspecified: Secondary | ICD-10-CM | POA: Diagnosis not present

## 2021-01-21 DIAGNOSIS — I679 Cerebrovascular disease, unspecified: Secondary | ICD-10-CM | POA: Diagnosis not present

## 2021-01-21 DIAGNOSIS — M159 Polyosteoarthritis, unspecified: Secondary | ICD-10-CM | POA: Diagnosis not present

## 2021-01-21 DIAGNOSIS — I10 Essential (primary) hypertension: Secondary | ICD-10-CM | POA: Diagnosis not present

## 2021-01-31 DIAGNOSIS — G629 Polyneuropathy, unspecified: Secondary | ICD-10-CM | POA: Diagnosis not present

## 2021-01-31 DIAGNOSIS — M069 Rheumatoid arthritis, unspecified: Secondary | ICD-10-CM | POA: Diagnosis not present

## 2021-01-31 DIAGNOSIS — Z8673 Personal history of transient ischemic attack (TIA), and cerebral infarction without residual deficits: Secondary | ICD-10-CM | POA: Diagnosis not present

## 2021-01-31 DIAGNOSIS — F419 Anxiety disorder, unspecified: Secondary | ICD-10-CM | POA: Diagnosis not present

## 2021-01-31 DIAGNOSIS — F329 Major depressive disorder, single episode, unspecified: Secondary | ICD-10-CM | POA: Diagnosis not present

## 2021-01-31 DIAGNOSIS — G2581 Restless legs syndrome: Secondary | ICD-10-CM | POA: Diagnosis not present

## 2021-01-31 DIAGNOSIS — M797 Fibromyalgia: Secondary | ICD-10-CM | POA: Diagnosis not present

## 2021-01-31 DIAGNOSIS — M62838 Other muscle spasm: Secondary | ICD-10-CM | POA: Diagnosis not present

## 2021-02-13 DIAGNOSIS — G8911 Acute pain due to trauma: Secondary | ICD-10-CM | POA: Diagnosis not present

## 2021-02-13 DIAGNOSIS — M25511 Pain in right shoulder: Secondary | ICD-10-CM | POA: Diagnosis not present

## 2021-02-14 DIAGNOSIS — G629 Polyneuropathy, unspecified: Secondary | ICD-10-CM | POA: Diagnosis not present

## 2021-02-14 DIAGNOSIS — R300 Dysuria: Secondary | ICD-10-CM | POA: Diagnosis not present

## 2021-02-14 DIAGNOSIS — N184 Chronic kidney disease, stage 4 (severe): Secondary | ICD-10-CM | POA: Diagnosis not present

## 2021-02-14 DIAGNOSIS — M797 Fibromyalgia: Secondary | ICD-10-CM | POA: Diagnosis not present

## 2021-02-14 DIAGNOSIS — M159 Polyosteoarthritis, unspecified: Secondary | ICD-10-CM | POA: Diagnosis not present

## 2021-02-14 DIAGNOSIS — Z8673 Personal history of transient ischemic attack (TIA), and cerebral infarction without residual deficits: Secondary | ICD-10-CM | POA: Diagnosis not present

## 2021-02-14 DIAGNOSIS — N390 Urinary tract infection, site not specified: Secondary | ICD-10-CM | POA: Diagnosis not present

## 2021-02-14 DIAGNOSIS — B9620 Unspecified Escherichia coli [E. coli] as the cause of diseases classified elsewhere: Secondary | ICD-10-CM | POA: Diagnosis not present

## 2021-02-14 DIAGNOSIS — R309 Painful micturition, unspecified: Secondary | ICD-10-CM | POA: Diagnosis not present

## 2021-02-14 DIAGNOSIS — F419 Anxiety disorder, unspecified: Secondary | ICD-10-CM | POA: Diagnosis not present

## 2021-02-15 DIAGNOSIS — F331 Major depressive disorder, recurrent, moderate: Secondary | ICD-10-CM | POA: Diagnosis not present

## 2021-02-15 DIAGNOSIS — G478 Other sleep disorders: Secondary | ICD-10-CM | POA: Diagnosis not present

## 2021-02-15 DIAGNOSIS — F411 Generalized anxiety disorder: Secondary | ICD-10-CM | POA: Diagnosis not present

## 2021-02-18 DIAGNOSIS — I679 Cerebrovascular disease, unspecified: Secondary | ICD-10-CM | POA: Diagnosis not present

## 2021-02-18 DIAGNOSIS — G629 Polyneuropathy, unspecified: Secondary | ICD-10-CM | POA: Diagnosis not present

## 2021-02-18 DIAGNOSIS — G2581 Restless legs syndrome: Secondary | ICD-10-CM | POA: Diagnosis not present

## 2021-02-18 DIAGNOSIS — F411 Generalized anxiety disorder: Secondary | ICD-10-CM | POA: Diagnosis not present

## 2021-02-18 DIAGNOSIS — R309 Painful micturition, unspecified: Secondary | ICD-10-CM | POA: Diagnosis not present

## 2021-02-18 DIAGNOSIS — N390 Urinary tract infection, site not specified: Secondary | ICD-10-CM | POA: Diagnosis not present

## 2021-02-18 DIAGNOSIS — F419 Anxiety disorder, unspecified: Secondary | ICD-10-CM | POA: Diagnosis not present

## 2021-02-18 DIAGNOSIS — M797 Fibromyalgia: Secondary | ICD-10-CM | POA: Diagnosis not present

## 2021-02-18 DIAGNOSIS — Z8673 Personal history of transient ischemic attack (TIA), and cerebral infarction without residual deficits: Secondary | ICD-10-CM | POA: Diagnosis not present

## 2021-02-25 DIAGNOSIS — S90822A Blister (nonthermal), left foot, initial encounter: Secondary | ICD-10-CM | POA: Diagnosis not present

## 2021-02-25 DIAGNOSIS — F419 Anxiety disorder, unspecified: Secondary | ICD-10-CM | POA: Diagnosis not present

## 2021-02-25 DIAGNOSIS — I10 Essential (primary) hypertension: Secondary | ICD-10-CM | POA: Diagnosis not present

## 2021-02-25 DIAGNOSIS — Z79899 Other long term (current) drug therapy: Secondary | ICD-10-CM | POA: Diagnosis not present

## 2021-02-25 DIAGNOSIS — M069 Rheumatoid arthritis, unspecified: Secondary | ICD-10-CM | POA: Diagnosis not present

## 2021-02-25 DIAGNOSIS — K219 Gastro-esophageal reflux disease without esophagitis: Secondary | ICD-10-CM | POA: Diagnosis not present

## 2021-02-25 DIAGNOSIS — R309 Painful micturition, unspecified: Secondary | ICD-10-CM | POA: Diagnosis not present

## 2021-02-25 DIAGNOSIS — F329 Major depressive disorder, single episode, unspecified: Secondary | ICD-10-CM | POA: Diagnosis not present

## 2021-03-01 DIAGNOSIS — B9620 Unspecified Escherichia coli [E. coli] as the cause of diseases classified elsewhere: Secondary | ICD-10-CM | POA: Diagnosis not present

## 2021-03-01 DIAGNOSIS — N184 Chronic kidney disease, stage 4 (severe): Secondary | ICD-10-CM | POA: Diagnosis not present

## 2021-03-01 DIAGNOSIS — D509 Iron deficiency anemia, unspecified: Secondary | ICD-10-CM | POA: Diagnosis not present

## 2021-03-01 DIAGNOSIS — N390 Urinary tract infection, site not specified: Secondary | ICD-10-CM | POA: Diagnosis not present

## 2021-03-04 DIAGNOSIS — G629 Polyneuropathy, unspecified: Secondary | ICD-10-CM | POA: Diagnosis not present

## 2021-03-04 DIAGNOSIS — R300 Dysuria: Secondary | ICD-10-CM | POA: Diagnosis not present

## 2021-03-04 DIAGNOSIS — E559 Vitamin D deficiency, unspecified: Secondary | ICD-10-CM | POA: Diagnosis not present

## 2021-03-04 DIAGNOSIS — F419 Anxiety disorder, unspecified: Secondary | ICD-10-CM | POA: Diagnosis not present

## 2021-03-04 DIAGNOSIS — G2581 Restless legs syndrome: Secondary | ICD-10-CM | POA: Diagnosis not present

## 2021-03-04 DIAGNOSIS — I679 Cerebrovascular disease, unspecified: Secondary | ICD-10-CM | POA: Diagnosis not present

## 2021-03-04 DIAGNOSIS — M797 Fibromyalgia: Secondary | ICD-10-CM | POA: Diagnosis not present

## 2021-03-04 DIAGNOSIS — N390 Urinary tract infection, site not specified: Secondary | ICD-10-CM | POA: Diagnosis not present

## 2021-03-18 DIAGNOSIS — E559 Vitamin D deficiency, unspecified: Secondary | ICD-10-CM | POA: Diagnosis not present

## 2021-03-18 DIAGNOSIS — I10 Essential (primary) hypertension: Secondary | ICD-10-CM | POA: Diagnosis not present

## 2021-03-18 DIAGNOSIS — K219 Gastro-esophageal reflux disease without esophagitis: Secondary | ICD-10-CM | POA: Diagnosis not present

## 2021-03-18 DIAGNOSIS — S90822A Blister (nonthermal), left foot, initial encounter: Secondary | ICD-10-CM | POA: Diagnosis not present

## 2021-03-18 DIAGNOSIS — F419 Anxiety disorder, unspecified: Secondary | ICD-10-CM | POA: Diagnosis not present

## 2021-03-18 DIAGNOSIS — M159 Polyosteoarthritis, unspecified: Secondary | ICD-10-CM | POA: Diagnosis not present

## 2021-03-18 DIAGNOSIS — F329 Major depressive disorder, single episode, unspecified: Secondary | ICD-10-CM | POA: Diagnosis not present

## 2021-03-21 DIAGNOSIS — F419 Anxiety disorder, unspecified: Secondary | ICD-10-CM | POA: Diagnosis not present

## 2021-03-21 DIAGNOSIS — I10 Essential (primary) hypertension: Secondary | ICD-10-CM | POA: Diagnosis not present

## 2021-03-21 DIAGNOSIS — M069 Rheumatoid arthritis, unspecified: Secondary | ICD-10-CM | POA: Diagnosis not present

## 2021-03-28 DIAGNOSIS — M797 Fibromyalgia: Secondary | ICD-10-CM | POA: Diagnosis not present

## 2021-03-28 DIAGNOSIS — G629 Polyneuropathy, unspecified: Secondary | ICD-10-CM | POA: Diagnosis not present

## 2021-03-28 DIAGNOSIS — M069 Rheumatoid arthritis, unspecified: Secondary | ICD-10-CM | POA: Diagnosis not present

## 2021-03-28 DIAGNOSIS — M159 Polyosteoarthritis, unspecified: Secondary | ICD-10-CM | POA: Diagnosis not present

## 2021-03-28 DIAGNOSIS — Z79899 Other long term (current) drug therapy: Secondary | ICD-10-CM | POA: Diagnosis not present

## 2021-03-28 DIAGNOSIS — F419 Anxiety disorder, unspecified: Secondary | ICD-10-CM | POA: Diagnosis not present

## 2021-03-28 DIAGNOSIS — F329 Major depressive disorder, single episode, unspecified: Secondary | ICD-10-CM | POA: Diagnosis not present

## 2021-03-28 DIAGNOSIS — K219 Gastro-esophageal reflux disease without esophagitis: Secondary | ICD-10-CM | POA: Diagnosis not present

## 2021-04-01 DIAGNOSIS — G478 Other sleep disorders: Secondary | ICD-10-CM | POA: Diagnosis not present

## 2021-04-01 DIAGNOSIS — M797 Fibromyalgia: Secondary | ICD-10-CM | POA: Diagnosis not present

## 2021-04-01 DIAGNOSIS — G629 Polyneuropathy, unspecified: Secondary | ICD-10-CM | POA: Diagnosis not present

## 2021-04-01 DIAGNOSIS — I10 Essential (primary) hypertension: Secondary | ICD-10-CM | POA: Diagnosis not present

## 2021-04-01 DIAGNOSIS — F329 Major depressive disorder, single episode, unspecified: Secondary | ICD-10-CM | POA: Diagnosis not present

## 2021-04-01 DIAGNOSIS — F411 Generalized anxiety disorder: Secondary | ICD-10-CM | POA: Diagnosis not present

## 2021-04-01 DIAGNOSIS — F331 Major depressive disorder, recurrent, moderate: Secondary | ICD-10-CM | POA: Diagnosis not present

## 2021-04-01 DIAGNOSIS — Z79899 Other long term (current) drug therapy: Secondary | ICD-10-CM | POA: Diagnosis not present

## 2021-04-01 DIAGNOSIS — F419 Anxiety disorder, unspecified: Secondary | ICD-10-CM | POA: Diagnosis not present

## 2021-04-01 DIAGNOSIS — K219 Gastro-esophageal reflux disease without esophagitis: Secondary | ICD-10-CM | POA: Diagnosis not present

## 2021-04-18 DIAGNOSIS — F329 Major depressive disorder, single episode, unspecified: Secondary | ICD-10-CM | POA: Diagnosis not present

## 2021-04-18 DIAGNOSIS — M797 Fibromyalgia: Secondary | ICD-10-CM | POA: Diagnosis not present

## 2021-04-18 DIAGNOSIS — M62838 Other muscle spasm: Secondary | ICD-10-CM | POA: Diagnosis not present

## 2021-04-18 DIAGNOSIS — Z79899 Other long term (current) drug therapy: Secondary | ICD-10-CM | POA: Diagnosis not present

## 2021-04-18 DIAGNOSIS — K219 Gastro-esophageal reflux disease without esophagitis: Secondary | ICD-10-CM | POA: Diagnosis not present

## 2021-04-18 DIAGNOSIS — F419 Anxiety disorder, unspecified: Secondary | ICD-10-CM | POA: Diagnosis not present

## 2021-04-18 DIAGNOSIS — G629 Polyneuropathy, unspecified: Secondary | ICD-10-CM | POA: Diagnosis not present

## 2021-04-18 DIAGNOSIS — M159 Polyosteoarthritis, unspecified: Secondary | ICD-10-CM | POA: Diagnosis not present

## 2021-05-02 DIAGNOSIS — F411 Generalized anxiety disorder: Secondary | ICD-10-CM | POA: Diagnosis not present

## 2021-05-15 DIAGNOSIS — F411 Generalized anxiety disorder: Secondary | ICD-10-CM | POA: Diagnosis not present

## 2021-05-15 DIAGNOSIS — G478 Other sleep disorders: Secondary | ICD-10-CM | POA: Diagnosis not present

## 2021-05-15 DIAGNOSIS — F331 Major depressive disorder, recurrent, moderate: Secondary | ICD-10-CM | POA: Diagnosis not present

## 2021-05-16 DIAGNOSIS — K219 Gastro-esophageal reflux disease without esophagitis: Secondary | ICD-10-CM | POA: Diagnosis not present

## 2021-05-16 DIAGNOSIS — M797 Fibromyalgia: Secondary | ICD-10-CM | POA: Diagnosis not present

## 2021-05-16 DIAGNOSIS — F329 Major depressive disorder, single episode, unspecified: Secondary | ICD-10-CM | POA: Diagnosis not present

## 2021-05-16 DIAGNOSIS — I10 Essential (primary) hypertension: Secondary | ICD-10-CM | POA: Diagnosis not present

## 2021-05-16 DIAGNOSIS — I679 Cerebrovascular disease, unspecified: Secondary | ICD-10-CM | POA: Diagnosis not present

## 2021-05-16 DIAGNOSIS — F419 Anxiety disorder, unspecified: Secondary | ICD-10-CM | POA: Diagnosis not present

## 2021-05-16 DIAGNOSIS — E559 Vitamin D deficiency, unspecified: Secondary | ICD-10-CM | POA: Diagnosis not present

## 2021-05-16 DIAGNOSIS — M069 Rheumatoid arthritis, unspecified: Secondary | ICD-10-CM | POA: Diagnosis not present

## 2021-05-22 DIAGNOSIS — M1990 Unspecified osteoarthritis, unspecified site: Secondary | ICD-10-CM | POA: Diagnosis not present

## 2021-05-22 DIAGNOSIS — Z8739 Personal history of other diseases of the musculoskeletal system and connective tissue: Secondary | ICD-10-CM | POA: Diagnosis not present

## 2021-05-22 DIAGNOSIS — M79643 Pain in unspecified hand: Secondary | ICD-10-CM | POA: Diagnosis not present

## 2021-05-22 DIAGNOSIS — M79671 Pain in right foot: Secondary | ICD-10-CM | POA: Diagnosis not present

## 2021-05-22 DIAGNOSIS — Z79899 Other long term (current) drug therapy: Secondary | ICD-10-CM | POA: Diagnosis not present

## 2021-05-22 DIAGNOSIS — M797 Fibromyalgia: Secondary | ICD-10-CM | POA: Diagnosis not present

## 2021-05-22 DIAGNOSIS — M130 Polyarthritis, unspecified: Secondary | ICD-10-CM | POA: Diagnosis not present

## 2021-05-29 ENCOUNTER — Encounter: Payer: Self-pay | Admitting: Physical Medicine & Rehabilitation

## 2021-05-30 DIAGNOSIS — Z8673 Personal history of transient ischemic attack (TIA), and cerebral infarction without residual deficits: Secondary | ICD-10-CM | POA: Diagnosis not present

## 2021-05-30 DIAGNOSIS — K219 Gastro-esophageal reflux disease without esophagitis: Secondary | ICD-10-CM | POA: Diagnosis not present

## 2021-05-30 DIAGNOSIS — G629 Polyneuropathy, unspecified: Secondary | ICD-10-CM | POA: Diagnosis not present

## 2021-05-30 DIAGNOSIS — M159 Polyosteoarthritis, unspecified: Secondary | ICD-10-CM | POA: Diagnosis not present

## 2021-05-30 DIAGNOSIS — F411 Generalized anxiety disorder: Secondary | ICD-10-CM | POA: Diagnosis not present

## 2021-05-30 DIAGNOSIS — Z79899 Other long term (current) drug therapy: Secondary | ICD-10-CM | POA: Diagnosis not present

## 2021-05-30 DIAGNOSIS — I10 Essential (primary) hypertension: Secondary | ICD-10-CM | POA: Diagnosis not present

## 2021-05-30 DIAGNOSIS — F419 Anxiety disorder, unspecified: Secondary | ICD-10-CM | POA: Diagnosis not present

## 2021-05-30 DIAGNOSIS — F329 Major depressive disorder, single episode, unspecified: Secondary | ICD-10-CM | POA: Diagnosis not present

## 2021-06-07 DIAGNOSIS — Z111 Encounter for screening for respiratory tuberculosis: Secondary | ICD-10-CM | POA: Diagnosis not present

## 2021-06-12 DIAGNOSIS — F331 Major depressive disorder, recurrent, moderate: Secondary | ICD-10-CM | POA: Diagnosis not present

## 2021-06-12 DIAGNOSIS — G478 Other sleep disorders: Secondary | ICD-10-CM | POA: Diagnosis not present

## 2021-06-12 DIAGNOSIS — F411 Generalized anxiety disorder: Secondary | ICD-10-CM | POA: Diagnosis not present

## 2021-06-14 DIAGNOSIS — F411 Generalized anxiety disorder: Secondary | ICD-10-CM | POA: Diagnosis not present

## 2021-06-20 DIAGNOSIS — E559 Vitamin D deficiency, unspecified: Secondary | ICD-10-CM | POA: Diagnosis not present

## 2021-06-20 DIAGNOSIS — Z8673 Personal history of transient ischemic attack (TIA), and cerebral infarction without residual deficits: Secondary | ICD-10-CM | POA: Diagnosis not present

## 2021-06-20 DIAGNOSIS — M797 Fibromyalgia: Secondary | ICD-10-CM | POA: Diagnosis not present

## 2021-06-20 DIAGNOSIS — K219 Gastro-esophageal reflux disease without esophagitis: Secondary | ICD-10-CM | POA: Diagnosis not present

## 2021-06-20 DIAGNOSIS — F419 Anxiety disorder, unspecified: Secondary | ICD-10-CM | POA: Diagnosis not present

## 2021-06-20 DIAGNOSIS — F329 Major depressive disorder, single episode, unspecified: Secondary | ICD-10-CM | POA: Diagnosis not present

## 2021-06-20 DIAGNOSIS — Z79899 Other long term (current) drug therapy: Secondary | ICD-10-CM | POA: Diagnosis not present

## 2021-06-20 DIAGNOSIS — F411 Generalized anxiety disorder: Secondary | ICD-10-CM | POA: Diagnosis not present

## 2021-06-20 DIAGNOSIS — I10 Essential (primary) hypertension: Secondary | ICD-10-CM | POA: Diagnosis not present

## 2021-07-03 DIAGNOSIS — B351 Tinea unguium: Secondary | ICD-10-CM | POA: Diagnosis not present

## 2021-07-03 DIAGNOSIS — M79674 Pain in right toe(s): Secondary | ICD-10-CM | POA: Diagnosis not present

## 2021-07-03 DIAGNOSIS — F411 Generalized anxiety disorder: Secondary | ICD-10-CM | POA: Diagnosis not present

## 2021-07-03 DIAGNOSIS — M79675 Pain in left toe(s): Secondary | ICD-10-CM | POA: Diagnosis not present

## 2021-07-04 DIAGNOSIS — M797 Fibromyalgia: Secondary | ICD-10-CM | POA: Diagnosis not present

## 2021-07-04 DIAGNOSIS — F419 Anxiety disorder, unspecified: Secondary | ICD-10-CM | POA: Diagnosis not present

## 2021-07-04 DIAGNOSIS — F329 Major depressive disorder, single episode, unspecified: Secondary | ICD-10-CM | POA: Diagnosis not present

## 2021-07-04 DIAGNOSIS — M159 Polyosteoarthritis, unspecified: Secondary | ICD-10-CM | POA: Diagnosis not present

## 2021-07-04 DIAGNOSIS — I10 Essential (primary) hypertension: Secondary | ICD-10-CM | POA: Diagnosis not present

## 2021-07-04 DIAGNOSIS — K219 Gastro-esophageal reflux disease without esophagitis: Secondary | ICD-10-CM | POA: Diagnosis not present

## 2021-07-04 DIAGNOSIS — G2581 Restless legs syndrome: Secondary | ICD-10-CM | POA: Diagnosis not present

## 2021-07-04 DIAGNOSIS — G629 Polyneuropathy, unspecified: Secondary | ICD-10-CM | POA: Diagnosis not present

## 2021-07-08 DIAGNOSIS — G629 Polyneuropathy, unspecified: Secondary | ICD-10-CM | POA: Diagnosis not present

## 2021-07-08 DIAGNOSIS — R21 Rash and other nonspecific skin eruption: Secondary | ICD-10-CM | POA: Diagnosis not present

## 2021-07-08 DIAGNOSIS — K219 Gastro-esophageal reflux disease without esophagitis: Secondary | ICD-10-CM | POA: Diagnosis not present

## 2021-07-08 DIAGNOSIS — F329 Major depressive disorder, single episode, unspecified: Secondary | ICD-10-CM | POA: Diagnosis not present

## 2021-07-08 DIAGNOSIS — E559 Vitamin D deficiency, unspecified: Secondary | ICD-10-CM | POA: Diagnosis not present

## 2021-07-08 DIAGNOSIS — M159 Polyosteoarthritis, unspecified: Secondary | ICD-10-CM | POA: Diagnosis not present

## 2021-07-08 DIAGNOSIS — M797 Fibromyalgia: Secondary | ICD-10-CM | POA: Diagnosis not present

## 2021-07-08 DIAGNOSIS — F419 Anxiety disorder, unspecified: Secondary | ICD-10-CM | POA: Diagnosis not present

## 2021-07-11 DIAGNOSIS — R7309 Other abnormal glucose: Secondary | ICD-10-CM | POA: Diagnosis not present

## 2021-07-11 DIAGNOSIS — Z79899 Other long term (current) drug therapy: Secondary | ICD-10-CM | POA: Diagnosis not present

## 2021-07-11 DIAGNOSIS — R32 Unspecified urinary incontinence: Secondary | ICD-10-CM | POA: Diagnosis not present

## 2021-07-11 DIAGNOSIS — M069 Rheumatoid arthritis, unspecified: Secondary | ICD-10-CM | POA: Diagnosis not present

## 2021-07-11 DIAGNOSIS — M159 Polyosteoarthritis, unspecified: Secondary | ICD-10-CM | POA: Diagnosis not present

## 2021-07-11 DIAGNOSIS — M797 Fibromyalgia: Secondary | ICD-10-CM | POA: Diagnosis not present

## 2021-07-11 DIAGNOSIS — G629 Polyneuropathy, unspecified: Secondary | ICD-10-CM | POA: Diagnosis not present

## 2021-07-11 DIAGNOSIS — R5381 Other malaise: Secondary | ICD-10-CM | POA: Diagnosis not present

## 2021-07-12 DIAGNOSIS — F411 Generalized anxiety disorder: Secondary | ICD-10-CM | POA: Diagnosis not present

## 2021-07-15 DIAGNOSIS — F329 Major depressive disorder, single episode, unspecified: Secondary | ICD-10-CM | POA: Diagnosis not present

## 2021-07-15 DIAGNOSIS — F419 Anxiety disorder, unspecified: Secondary | ICD-10-CM | POA: Diagnosis not present

## 2021-07-15 DIAGNOSIS — K219 Gastro-esophageal reflux disease without esophagitis: Secondary | ICD-10-CM | POA: Diagnosis not present

## 2021-07-15 DIAGNOSIS — Z79899 Other long term (current) drug therapy: Secondary | ICD-10-CM | POA: Diagnosis not present

## 2021-07-15 DIAGNOSIS — M159 Polyosteoarthritis, unspecified: Secondary | ICD-10-CM | POA: Diagnosis not present

## 2021-07-15 DIAGNOSIS — E559 Vitamin D deficiency, unspecified: Secondary | ICD-10-CM | POA: Diagnosis not present

## 2021-07-15 DIAGNOSIS — G629 Polyneuropathy, unspecified: Secondary | ICD-10-CM | POA: Diagnosis not present

## 2021-07-15 DIAGNOSIS — M797 Fibromyalgia: Secondary | ICD-10-CM | POA: Diagnosis not present

## 2021-07-16 DIAGNOSIS — K219 Gastro-esophageal reflux disease without esophagitis: Secondary | ICD-10-CM | POA: Diagnosis not present

## 2021-07-16 DIAGNOSIS — I10 Essential (primary) hypertension: Secondary | ICD-10-CM | POA: Diagnosis not present

## 2021-07-16 DIAGNOSIS — G629 Polyneuropathy, unspecified: Secondary | ICD-10-CM | POA: Diagnosis not present

## 2021-07-16 DIAGNOSIS — J302 Other seasonal allergic rhinitis: Secondary | ICD-10-CM | POA: Diagnosis not present

## 2021-07-16 DIAGNOSIS — M069 Rheumatoid arthritis, unspecified: Secondary | ICD-10-CM | POA: Diagnosis not present

## 2021-07-16 DIAGNOSIS — F418 Other specified anxiety disorders: Secondary | ICD-10-CM | POA: Diagnosis not present

## 2021-07-16 DIAGNOSIS — M62838 Other muscle spasm: Secondary | ICD-10-CM | POA: Diagnosis not present

## 2021-07-16 DIAGNOSIS — R634 Abnormal weight loss: Secondary | ICD-10-CM | POA: Diagnosis not present

## 2021-07-18 DIAGNOSIS — D485 Neoplasm of uncertain behavior of skin: Secondary | ICD-10-CM | POA: Diagnosis not present

## 2021-07-18 DIAGNOSIS — B86 Scabies: Secondary | ICD-10-CM | POA: Diagnosis not present

## 2021-07-18 DIAGNOSIS — L218 Other seborrheic dermatitis: Secondary | ICD-10-CM | POA: Diagnosis not present

## 2021-07-19 DIAGNOSIS — N390 Urinary tract infection, site not specified: Secondary | ICD-10-CM | POA: Diagnosis not present

## 2021-07-22 DIAGNOSIS — Z79899 Other long term (current) drug therapy: Secondary | ICD-10-CM | POA: Diagnosis not present

## 2021-07-22 DIAGNOSIS — Z8673 Personal history of transient ischemic attack (TIA), and cerebral infarction without residual deficits: Secondary | ICD-10-CM | POA: Diagnosis not present

## 2021-07-22 DIAGNOSIS — B86 Scabies: Secondary | ICD-10-CM | POA: Diagnosis not present

## 2021-07-22 DIAGNOSIS — G629 Polyneuropathy, unspecified: Secondary | ICD-10-CM | POA: Diagnosis not present

## 2021-07-22 DIAGNOSIS — F419 Anxiety disorder, unspecified: Secondary | ICD-10-CM | POA: Diagnosis not present

## 2021-07-22 DIAGNOSIS — R21 Rash and other nonspecific skin eruption: Secondary | ICD-10-CM | POA: Diagnosis not present

## 2021-07-22 DIAGNOSIS — I10 Essential (primary) hypertension: Secondary | ICD-10-CM | POA: Diagnosis not present

## 2021-07-22 DIAGNOSIS — M797 Fibromyalgia: Secondary | ICD-10-CM | POA: Diagnosis not present

## 2021-07-24 DIAGNOSIS — N390 Urinary tract infection, site not specified: Secondary | ICD-10-CM | POA: Diagnosis not present

## 2021-07-25 DIAGNOSIS — N390 Urinary tract infection, site not specified: Secondary | ICD-10-CM | POA: Diagnosis not present

## 2021-07-25 DIAGNOSIS — R300 Dysuria: Secondary | ICD-10-CM | POA: Diagnosis not present

## 2021-07-25 DIAGNOSIS — R32 Unspecified urinary incontinence: Secondary | ICD-10-CM | POA: Diagnosis not present

## 2021-07-25 DIAGNOSIS — M62838 Other muscle spasm: Secondary | ICD-10-CM | POA: Diagnosis not present

## 2021-07-25 DIAGNOSIS — I679 Cerebrovascular disease, unspecified: Secondary | ICD-10-CM | POA: Diagnosis not present

## 2021-07-25 DIAGNOSIS — M159 Polyosteoarthritis, unspecified: Secondary | ICD-10-CM | POA: Diagnosis not present

## 2021-07-25 DIAGNOSIS — G629 Polyneuropathy, unspecified: Secondary | ICD-10-CM | POA: Diagnosis not present

## 2021-07-25 DIAGNOSIS — E559 Vitamin D deficiency, unspecified: Secondary | ICD-10-CM | POA: Diagnosis not present

## 2021-07-29 DIAGNOSIS — F4321 Adjustment disorder with depressed mood: Secondary | ICD-10-CM | POA: Diagnosis not present

## 2021-07-29 DIAGNOSIS — F411 Generalized anxiety disorder: Secondary | ICD-10-CM | POA: Diagnosis not present

## 2021-08-01 ENCOUNTER — Ambulatory Visit: Payer: Medicare PPO | Admitting: Physician Assistant

## 2021-08-01 DIAGNOSIS — Z79899 Other long term (current) drug therapy: Secondary | ICD-10-CM | POA: Diagnosis not present

## 2021-08-01 DIAGNOSIS — G2581 Restless legs syndrome: Secondary | ICD-10-CM | POA: Diagnosis not present

## 2021-08-01 DIAGNOSIS — F419 Anxiety disorder, unspecified: Secondary | ICD-10-CM | POA: Diagnosis not present

## 2021-08-01 DIAGNOSIS — F329 Major depressive disorder, single episode, unspecified: Secondary | ICD-10-CM | POA: Diagnosis not present

## 2021-08-01 DIAGNOSIS — G629 Polyneuropathy, unspecified: Secondary | ICD-10-CM | POA: Diagnosis not present

## 2021-08-01 DIAGNOSIS — Z8673 Personal history of transient ischemic attack (TIA), and cerebral infarction without residual deficits: Secondary | ICD-10-CM | POA: Diagnosis not present

## 2021-08-01 DIAGNOSIS — G4700 Insomnia, unspecified: Secondary | ICD-10-CM | POA: Diagnosis not present

## 2021-08-06 DIAGNOSIS — F331 Major depressive disorder, recurrent, moderate: Secondary | ICD-10-CM | POA: Diagnosis not present

## 2021-08-06 DIAGNOSIS — G478 Other sleep disorders: Secondary | ICD-10-CM | POA: Diagnosis not present

## 2021-08-06 DIAGNOSIS — F411 Generalized anxiety disorder: Secondary | ICD-10-CM | POA: Diagnosis not present

## 2021-08-08 ENCOUNTER — Ambulatory Visit: Payer: Medicare PPO | Admitting: Physician Assistant

## 2021-08-08 DIAGNOSIS — F329 Major depressive disorder, single episode, unspecified: Secondary | ICD-10-CM | POA: Diagnosis not present

## 2021-08-08 DIAGNOSIS — G629 Polyneuropathy, unspecified: Secondary | ICD-10-CM | POA: Diagnosis not present

## 2021-08-08 DIAGNOSIS — Z79899 Other long term (current) drug therapy: Secondary | ICD-10-CM | POA: Diagnosis not present

## 2021-08-08 DIAGNOSIS — M069 Rheumatoid arthritis, unspecified: Secondary | ICD-10-CM | POA: Diagnosis not present

## 2021-08-08 DIAGNOSIS — N390 Urinary tract infection, site not specified: Secondary | ICD-10-CM

## 2021-08-08 DIAGNOSIS — M797 Fibromyalgia: Secondary | ICD-10-CM | POA: Diagnosis not present

## 2021-08-08 DIAGNOSIS — M159 Polyosteoarthritis, unspecified: Secondary | ICD-10-CM | POA: Diagnosis not present

## 2021-08-08 DIAGNOSIS — N3281 Overactive bladder: Secondary | ICD-10-CM

## 2021-08-09 ENCOUNTER — Encounter: Payer: Medicare PPO | Admitting: Physical Medicine & Rehabilitation

## 2021-08-09 ENCOUNTER — Ambulatory Visit: Payer: Medicare PPO | Admitting: Urology

## 2021-08-12 DIAGNOSIS — R2241 Localized swelling, mass and lump, right lower limb: Secondary | ICD-10-CM | POA: Diagnosis not present

## 2021-08-14 ENCOUNTER — Other Ambulatory Visit: Payer: Self-pay

## 2021-08-14 ENCOUNTER — Ambulatory Visit (INDEPENDENT_AMBULATORY_CARE_PROVIDER_SITE_OTHER): Payer: Medicare PPO | Admitting: Physician Assistant

## 2021-08-14 VITALS — BP 133/84 | HR 69 | Ht 66.0 in

## 2021-08-14 DIAGNOSIS — N3001 Acute cystitis with hematuria: Secondary | ICD-10-CM | POA: Diagnosis not present

## 2021-08-14 DIAGNOSIS — F411 Generalized anxiety disorder: Secondary | ICD-10-CM | POA: Diagnosis not present

## 2021-08-14 DIAGNOSIS — N3281 Overactive bladder: Secondary | ICD-10-CM | POA: Diagnosis not present

## 2021-08-14 DIAGNOSIS — N390 Urinary tract infection, site not specified: Secondary | ICD-10-CM

## 2021-08-14 LAB — BLADDER SCAN AMB NON-IMAGING: Scan Result: 60

## 2021-08-14 MED ORDER — CEFDINIR 300 MG PO CAPS: 300.0000 mg | ORAL_CAPSULE | Freq: Two times a day (BID) | ORAL | 0 refills | Status: DC

## 2021-08-14 NOTE — Progress Notes (Signed)
post void residual=5m ?

## 2021-08-14 NOTE — Progress Notes (Unsigned)
Assessment: No diagnosis found.  Plan: ***  Chief Complaint: No chief complaint on file.   HPI: Robin Blackburn is a 76 y.o. female who presents for continued evaluation of ***.   05/21/20 Robin Blackburn is a 75yo here for followup for recurrent UTI and OAB. She was placed on mirabegron '25mg'$  last visit. PVR 111cc today. She noted improvement in her incontinence and a decrease in her pad usage on mirabegron. She was then switch to oxybutynin '5mg'$  which is causing worsening suprapubic pressure and incontinence. She has a slight burn with each urination. No hematuria. She continues to have urinary urgency and frequency. No UTI since last visit.  Portions of the above documentation were copied from a prior visit for review purposes only.  Allergies: Allergies  Allergen Reactions   Demerol [Meperidine] Anaphylaxis   Hydromorphone Anaphylaxis    Other reaction(s): Unknown   Keflex [Cephalexin] Nausea And Vomiting   Penicillins Anaphylaxis    Has patient had a PCN reaction causing immediate rash, facial/tongue/throat swelling, SOB or lightheadedness with hypotension: Yes Has patient had a PCN reaction causing severe rash involving mucus membranes or skin necrosis: No Has patient had a PCN reaction that required hospitalization No Has patient had a PCN reaction occurring within the last 10 years: No If all of the above answers are "NO", then may proceed with Cephalosporin use.  Other reaction(s): Unknown, Unknown   Sulfa Antibiotics Nausea And Vomiting    Other reaction(s): Unknown   Acetaminophen     Other reaction(s): Unknown   Bisphosphonates     GI intolerance Other reaction(s): Unknown, Unknown   Codeine    Fentanyl Other (See Comments)    "felt like I had demons in my head" Other reaction(s): Unknown, Unknown   Influenza Virus Vaccine Other (See Comments)   Iodine     Other reaction(s): Unknown   Iohexol     Other reaction(s): Unknown, Unknown   Levofloxacin     Other  reaction(s): Unknown, Unknown   Meperidine Hcl     Other reaction(s): Unknown, Unknown   Misc. Sulfonamide Containing Compounds     Other reaction(s): Unknown   Morphine     Other reaction(s): Unknown, Unknown   Oxycodone     Other reaction(s): Unknown   Oxycodone Hcl     Other reaction(s): Unknown   Oxytetracycline Nausea And Vomiting    Other reaction(s): Unknown   Percocet [Oxycodone-Acetaminophen] Swelling    Mouth swelling.   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   Depression    Fibromyalgia    Hypertension    Neuropathy    Rheumatoid arthritis ([place])    Stroke ([place]) 02/17/14    PSH: Past Surgical History:  Procedure Laterality Date   ABDOMINAL HYSTERECTOMY     ANKLE RECONSTRUCTION     APPENDECTOMY     BACK SURGERY     CHOLECYSTECTOMY     KNEE SURGERY      SH: Social History   Tobacco Use   Smoking status: Never   Smokeless tobacco: Never  Vaping Use   Vaping Use: Never used  Substance Use Topics  Alcohol use: No   Drug use: No    ROS: Constitutional:  Negative for fever, chills, weight loss CV: Negative for chest pain, previous MI, hypertension Respiratory:  Negative for shortness of breath, wheezing, sleep apnea, frequent cough GI:  Negative for nausea, vomiting, bloody stool, GERD  PE: There were no vitals taken for this visit. GENERAL APPEARANCE:  Well appearing, well developed, well nourished, NAD HEENT:  Atraumatic, normocephalic NECK:  Supple. Trachea midline ABDOMEN:  Soft, non-tender, no masses EXTREMITIES:  Moves all extremities well, without clubbing, cyanosis, or edema NEUROLOGIC:  Alert and oriented x 3, normal gait, CN  II-XII grossly intact MENTAL STATUS:  appropriate BACK:  Non-tender to palpation, No CVAT SKIN:  Warm, dry, and intact   Results: Laboratory Data: Lab Results  Component Value Date   WBC 11.3 (H) 08/23/2018   HGB 12.6 08/23/2018   HCT 41.5 08/23/2018   MCV 95.8 08/23/2018   PLT 422 (H) 08/23/2018    Lab Results  Component Value Date   CREATININE 0.78 08/23/2018    No results found for: PSA  No results found for: TESTOSTERONE  Lab Results  Component Value Date   HGBA1C 6.1 (H) 08/15/2015    Urinalysis    Component Value Date/Time   COLORURINE STRAW (A) 08/20/2018 1225   APPEARANCEUR CLEAR 08/20/2018 1225   LABSPEC 1.004 (L) 08/20/2018 1225   PHURINE 6.0 08/20/2018 1225   GLUCOSEU NEGATIVE 08/20/2018 1225   HGBUR MODERATE (A) 08/20/2018 1225   BILIRUBINUR NEGATIVE 08/20/2018 1225   KETONESUR NEGATIVE 08/20/2018 1225   PROTEINUR NEGATIVE 08/20/2018 1225   UROBILINOGEN 0.2 04/10/2015 2212   NITRITE NEGATIVE 08/20/2018 1225   LEUKOCYTESUR NEGATIVE 08/20/2018 1225    Lab Results  Component Value Date   BACTERIA NONE SEEN 08/20/2018    Pertinent Imaging: *** No results found for this or any previous visit.  Results for orders placed during the hospital encounter of 02/19/14  US Venous Img Lower Bilateral  Narrative CLINICAL DATA:  Bilateral lower extremity pain and swelling  EXAM: BILATERAL LOWER EXTREMITY VENOUS DOPPLER ULTRASOUND  TECHNIQUE: Gray-scale sonography with graded compression, as well as color Doppler and duplex ultrasound were performed to evaluate the lower extremity deep venous systems from the level of the common femoral vein and including the common femoral, femoral, profunda femoral, popliteal and calf veins including the posterior tibial, peroneal and gastrocnemius veins when visible. The superficial great saphenous vein was also interrogated. Spectral Doppler was utilized to evaluate flow at rest and with distal augmentation  maneuvers in the common femoral, femoral and popliteal veins.  COMPARISON:  Prior left lower extremity duplex venous ultrasound 02/24/2008  FINDINGS: RIGHT LOWER EXTREMITY  Common Femoral Vein: No evidence of thrombus. Normal compressibility, respiratory phasicity and response to augmentation.  Saphenofemoral Junction: No evidence of thrombus. Normal compressibility and flow on color Doppler imaging.  Profunda Femoral Vein: No evidence of thrombus. Normal compressibility and flow on color Doppler imaging.  Femoral Vein: No evidence of thrombus. Normal compressibility, respiratory phasicity and response to augmentation.  Popliteal Vein: No evidence of thrombus. Normal compressibility, respiratory phasicity and response to augmentation.  Calf Veins: No evidence of thrombus. Normal compressibility and flow on color Doppler imaging.  Superficial Great Saphenous Vein: No evidence of thrombus. Normal compressibility and flow on color Doppler imaging.  Venous Reflux:  None.  Other Findings:  None.  LEFT LOWER EXTREMITY  Common Femoral Vein: No evidence of thrombus. Normal compressibility, respiratory phasicity and response to augmentation.  Saphenofemoral Junction:  No evidence of thrombus. Normal compressibility and flow on color Doppler imaging.  Profunda Femoral Vein: No evidence of thrombus. Normal compressibility and flow on color Doppler imaging.  Femoral Vein: No evidence of thrombus. Normal compressibility, respiratory phasicity and response to augmentation.  Popliteal Vein: No evidence of thrombus. Normal compressibility, respiratory phasicity and response to augmentation.  Calf Veins: No evidence of thrombus. Normal compressibility and flow on color Doppler imaging.  Superficial Great Saphenous Vein: No evidence of thrombus. Normal compressibility and flow on color Doppler imaging.  Venous Reflux:  None.  Other Findings:  None.  IMPRESSION: No evidence  of deep venous thrombosis.   Electronically Signed By: Jacqulynn Cadet M.D. On: 02/21/2014 07:47  No results found for this or any previous visit.  No results found for this or any previous visit.  No results found for this or any previous visit.  No results found for this or any previous visit.  No results found for this or any previous visit.  No results found for this or any previous visit.  No results found for this or any previous visit (from the past 24 hour(s)).

## 2021-08-15 LAB — URINALYSIS, ROUTINE W REFLEX MICROSCOPIC
Bilirubin, UA: NEGATIVE
Glucose, UA: NEGATIVE
Ketones, UA: NEGATIVE
Nitrite, UA: POSITIVE — AB
Specific Gravity, UA: 1.025 (ref 1.005–1.030)
Urobilinogen, Ur: 0.2 mg/dL (ref 0.2–1.0)
pH, UA: 5.5 (ref 5.0–7.5)

## 2021-08-15 LAB — MICROSCOPIC EXAMINATION: WBC, UA: >30 /hpf — AB (ref 0–5)

## 2021-08-19 ENCOUNTER — Telehealth: Payer: Self-pay

## 2021-08-19 LAB — URINE CULTURE

## 2021-08-19 NOTE — Telephone Encounter (Signed)
Called cell number for patient. No answer.  ?

## 2021-08-19 NOTE — Telephone Encounter (Signed)
Patient left a voicemail on Fri. 08-16-21 ? ?Expressed concern regarding change in antibiotic.  Needing to verify that medication was changed.  Was only told by nurse at facility she lives at. ? ?Please advise. ? ?Thanks, ?Helene Kelp ?

## 2021-08-20 ENCOUNTER — Other Ambulatory Visit: Payer: Self-pay | Admitting: Physician Assistant

## 2021-08-20 DIAGNOSIS — E039 Hypothyroidism, unspecified: Secondary | ICD-10-CM | POA: Diagnosis not present

## 2021-08-20 NOTE — Progress Notes (Signed)
Meropenem order faxed to the patient's nursing facility ?

## 2021-08-21 ENCOUNTER — Telehealth: Payer: Self-pay

## 2021-08-21 NOTE — Telephone Encounter (Signed)
Orders faxed to [facility] to [phone] with confirmation ?

## 2021-08-21 NOTE — Telephone Encounter (Signed)
-----   Message from Reynaldo Minium, [place] sent at 08/20/2021 11:32 AM EDT ----- ?Pt urine culture is resistant to all meds except a few she is severely allergic to. Pt needs foley placed. Please send orders to NH for pt to have indwelling foley placed to drain this infection and irrigate as needed with routine foley care. ? ?----- Message ----- ?From: Interface, [facility] Lab Results In ?Sent: 08/15/2021  10:37 AM EDT ?To: Berneice Heinrich Summerlin, PA-C ? ? ?

## 2021-08-21 NOTE — Telephone Encounter (Signed)
Returned call to patient. Expressed importance to patient for placement of urinary catheter. Patient voiced understanding but will think about her decision if she will agree to foley catheter placement.  ?

## 2021-08-21 NOTE — Telephone Encounter (Signed)
Patient left a voicemail 08-21-2021. ? ?Calling regarding medication (see previous message) ? ?Also wants too discuss why she is being told she has to have a foley cath put in by the staff there.  She wants to know if it was ordered by [facility]? ? ?Please advise. ? ?Call back: ?Call [facility] ? ask to speak with Everlene Balls. ? [phone] ? ?Thanks, ?Helene Kelp ?

## 2021-08-21 NOTE — Telephone Encounter (Signed)
Robin Blackburn from patient LTC facility called- Robin Blackburn is refusing catheter placement per PA recommendations ? ?Message sent to provider for advice.  ?

## 2021-08-22 NOTE — Telephone Encounter (Signed)
Called and spoke with patient's nurse Jamelle Rushing and she states that patient did allow them to put a foley catheter in yesterday. ?

## 2021-08-23 DIAGNOSIS — Z978 Presence of other specified devices: Secondary | ICD-10-CM | POA: Diagnosis not present

## 2021-08-23 DIAGNOSIS — R300 Dysuria: Secondary | ICD-10-CM | POA: Diagnosis not present

## 2021-08-23 DIAGNOSIS — N390 Urinary tract infection, site not specified: Secondary | ICD-10-CM | POA: Diagnosis not present

## 2021-08-26 DIAGNOSIS — B9620 Unspecified Escherichia coli [E. coli] as the cause of diseases classified elsewhere: Secondary | ICD-10-CM | POA: Diagnosis not present

## 2021-08-26 DIAGNOSIS — K589 Irritable bowel syndrome without diarrhea: Secondary | ICD-10-CM | POA: Diagnosis not present

## 2021-08-26 DIAGNOSIS — N390 Urinary tract infection, site not specified: Secondary | ICD-10-CM | POA: Diagnosis not present

## 2021-08-26 MED ORDER — MEROPENEM 500 MG IV SOLR: 500.0000 mg | Freq: Three times a day (TID) | INTRAVENOUS | 0 refills | Status: DC

## 2021-09-02 DIAGNOSIS — M069 Rheumatoid arthritis, unspecified: Secondary | ICD-10-CM | POA: Diagnosis not present

## 2021-09-02 DIAGNOSIS — Z8673 Personal history of transient ischemic attack (TIA), and cerebral infarction without residual deficits: Secondary | ICD-10-CM | POA: Diagnosis not present

## 2021-09-02 DIAGNOSIS — F329 Major depressive disorder, single episode, unspecified: Secondary | ICD-10-CM | POA: Diagnosis not present

## 2021-09-02 DIAGNOSIS — F419 Anxiety disorder, unspecified: Secondary | ICD-10-CM | POA: Diagnosis not present

## 2021-09-02 DIAGNOSIS — Z79899 Other long term (current) drug therapy: Secondary | ICD-10-CM | POA: Diagnosis not present

## 2021-09-02 DIAGNOSIS — M797 Fibromyalgia: Secondary | ICD-10-CM | POA: Diagnosis not present

## 2021-09-02 DIAGNOSIS — E559 Vitamin D deficiency, unspecified: Secondary | ICD-10-CM | POA: Diagnosis not present

## 2021-09-02 DIAGNOSIS — G629 Polyneuropathy, unspecified: Secondary | ICD-10-CM | POA: Diagnosis not present

## 2021-09-03 DIAGNOSIS — F411 Generalized anxiety disorder: Secondary | ICD-10-CM | POA: Diagnosis not present

## 2021-09-04 DIAGNOSIS — N390 Urinary tract infection, site not specified: Secondary | ICD-10-CM | POA: Diagnosis not present

## 2021-09-04 NOTE — Telephone Encounter (Signed)
Patient left a voicemail. ? ?Wanting to speak with a nurse regarding foley cath pain and concerns. ? ?Please advise. ? ?Thanks, ?Helene Kelp ?

## 2021-09-06 NOTE — Telephone Encounter (Signed)
Returned call to [facility], nurse had nothing in notes regarding concerns with foley cath.  They then transferred the call to transportation ext. with no answer. ?

## 2021-09-09 DIAGNOSIS — Z978 Presence of other specified devices: Secondary | ICD-10-CM | POA: Diagnosis not present

## 2021-09-09 DIAGNOSIS — R5381 Other malaise: Secondary | ICD-10-CM | POA: Diagnosis not present

## 2021-09-09 DIAGNOSIS — K219 Gastro-esophageal reflux disease without esophagitis: Secondary | ICD-10-CM | POA: Diagnosis not present

## 2021-09-09 DIAGNOSIS — K589 Irritable bowel syndrome without diarrhea: Secondary | ICD-10-CM | POA: Diagnosis not present

## 2021-09-09 DIAGNOSIS — G2581 Restless legs syndrome: Secondary | ICD-10-CM | POA: Diagnosis not present

## 2021-09-09 DIAGNOSIS — G4700 Insomnia, unspecified: Secondary | ICD-10-CM | POA: Diagnosis not present

## 2021-09-09 DIAGNOSIS — F418 Other specified anxiety disorders: Secondary | ICD-10-CM | POA: Diagnosis not present

## 2021-09-09 DIAGNOSIS — N390 Urinary tract infection, site not specified: Secondary | ICD-10-CM | POA: Diagnosis not present

## 2021-09-10 ENCOUNTER — Other Ambulatory Visit: Payer: Self-pay

## 2021-09-11 ENCOUNTER — Telehealth: Payer: Self-pay

## 2021-09-11 NOTE — Telephone Encounter (Signed)
A nurse with [facility] called asking if you received the patients UA results and what her next steps are as far as care plan.  Please advise. ?

## 2021-09-12 ENCOUNTER — Telehealth: Payer: Self-pay

## 2021-09-12 DIAGNOSIS — N390 Urinary tract infection, site not specified: Secondary | ICD-10-CM | POA: Diagnosis not present

## 2021-09-12 NOTE — Telephone Encounter (Signed)
Patient called with questions about her treatment plan.  She was very adamant about not wanting to keep her foley in.  Per patient " I am not a doctor but there has to be another option or medication I can try because I am not going to keep this foley in".   She states she has been a pt with this practice for years and is asking if another provider in the practice has other options for her.  Please advise on how to respond to the patient.  ?

## 2021-09-13 DIAGNOSIS — F331 Major depressive disorder, recurrent, moderate: Secondary | ICD-10-CM | POA: Diagnosis not present

## 2021-09-13 DIAGNOSIS — F419 Anxiety disorder, unspecified: Secondary | ICD-10-CM | POA: Diagnosis not present

## 2021-09-17 DIAGNOSIS — K219 Gastro-esophageal reflux disease without esophagitis: Secondary | ICD-10-CM | POA: Diagnosis not present

## 2021-09-17 DIAGNOSIS — I679 Cerebrovascular disease, unspecified: Secondary | ICD-10-CM | POA: Diagnosis not present

## 2021-09-17 DIAGNOSIS — E559 Vitamin D deficiency, unspecified: Secondary | ICD-10-CM | POA: Diagnosis not present

## 2021-09-17 DIAGNOSIS — F419 Anxiety disorder, unspecified: Secondary | ICD-10-CM | POA: Diagnosis not present

## 2021-09-17 DIAGNOSIS — F329 Major depressive disorder, single episode, unspecified: Secondary | ICD-10-CM | POA: Diagnosis not present

## 2021-09-17 DIAGNOSIS — M069 Rheumatoid arthritis, unspecified: Secondary | ICD-10-CM | POA: Diagnosis not present

## 2021-09-17 DIAGNOSIS — M797 Fibromyalgia: Secondary | ICD-10-CM | POA: Diagnosis not present

## 2021-09-17 DIAGNOSIS — I10 Essential (primary) hypertension: Secondary | ICD-10-CM | POA: Diagnosis not present

## 2021-09-18 ENCOUNTER — Other Ambulatory Visit: Payer: Self-pay

## 2021-09-18 ENCOUNTER — Telehealth: Payer: Self-pay

## 2021-09-18 DIAGNOSIS — F331 Major depressive disorder, recurrent, moderate: Secondary | ICD-10-CM | POA: Diagnosis not present

## 2021-09-18 DIAGNOSIS — N390 Urinary tract infection, site not specified: Secondary | ICD-10-CM | POA: Diagnosis not present

## 2021-09-18 DIAGNOSIS — F411 Generalized anxiety disorder: Secondary | ICD-10-CM | POA: Diagnosis not present

## 2021-09-18 DIAGNOSIS — M069 Rheumatoid arthritis, unspecified: Secondary | ICD-10-CM | POA: Diagnosis not present

## 2021-09-18 DIAGNOSIS — M79675 Pain in left toe(s): Secondary | ICD-10-CM | POA: Diagnosis not present

## 2021-09-18 DIAGNOSIS — R238 Other skin changes: Secondary | ICD-10-CM | POA: Diagnosis not present

## 2021-09-18 DIAGNOSIS — K219 Gastro-esophageal reflux disease without esophagitis: Secondary | ICD-10-CM | POA: Diagnosis not present

## 2021-09-18 DIAGNOSIS — G478 Other sleep disorders: Secondary | ICD-10-CM | POA: Diagnosis not present

## 2021-09-18 DIAGNOSIS — I10 Essential (primary) hypertension: Secondary | ICD-10-CM | POA: Diagnosis not present

## 2021-09-18 DIAGNOSIS — M79674 Pain in right toe(s): Secondary | ICD-10-CM | POA: Diagnosis not present

## 2021-09-18 DIAGNOSIS — K589 Irritable bowel syndrome without diarrhea: Secondary | ICD-10-CM | POA: Diagnosis not present

## 2021-09-18 DIAGNOSIS — F329 Major depressive disorder, single episode, unspecified: Secondary | ICD-10-CM | POA: Diagnosis not present

## 2021-09-18 DIAGNOSIS — Z978 Presence of other specified devices: Secondary | ICD-10-CM | POA: Diagnosis not present

## 2021-09-18 DIAGNOSIS — B351 Tinea unguium: Secondary | ICD-10-CM | POA: Diagnosis not present

## 2021-09-18 NOTE — Telephone Encounter (Signed)
Called facility, [place] already left for the day.  Informed them we will try to reach out tomorrow or she could call us back tomorrow to follow up with PA. ?

## 2021-09-18 NOTE — Telephone Encounter (Signed)
Urine collected from clean urine bag last changed on 09/11/21.  Patient has been complaining of burning and having some confusion as well as having skin rashes.  Doctor at the facility tried to reach out on lunch to speak with PA about sending the patient to the ER for antibiotics.  Patient is refusing to go to the hospital, doctor rx'd antibiotic and benadryl for the patient to start because they believe she still has the infection.  They are asking for the next steps in her plan of care.  Please advise. ?

## 2021-09-18 NOTE — Telephone Encounter (Signed)
Tried to reach facility with no answer, will try again at a later time.  ?

## 2021-09-18 NOTE — Telephone Encounter (Signed)
-----   Message from Reynaldo Minium, [place] sent at 09/18/2021 12:38 PM EDT ----- ?Please have [facility] confirm whether this urine was taken for her indwelling foley bag or from clean bag during foley change. ?----- Message ----- ?From: Jennette Bill ?Sent: 09/18/2021   9:16 AM EDT ?To: Berneice Heinrich Summerlin, PA-C ? ? ?

## 2021-09-19 NOTE — Telephone Encounter (Signed)
I spoke with nurse, [place], at [facility] concerning Ms. Robin Blackburn. ? ?Per [place], patient started Fosfomycin for 3 days every other day. Patient last dose on Monday 09/23/2021.  ? ?Reviewed chart with our PA, Sharee Pimple. New orders as follows ? ?Patient to have catheter removed by facility (as per patient request) on Wednesday 09/25/2021 for voiding trial. If patient has not voided in 8 hours or has increase abdominal pain- staff at facility will reinsert foley catheter.  Patient to hold her oxybutynin starting today and will resume on Friday 09/27/2021. ? ? ?Orders faxed to [phone] ?As well and reviewed with nurse [place] at facility.  ?

## 2021-09-23 DIAGNOSIS — I10 Essential (primary) hypertension: Secondary | ICD-10-CM | POA: Diagnosis not present

## 2021-09-23 DIAGNOSIS — F331 Major depressive disorder, recurrent, moderate: Secondary | ICD-10-CM | POA: Diagnosis not present

## 2021-09-23 DIAGNOSIS — G894 Chronic pain syndrome: Secondary | ICD-10-CM | POA: Diagnosis not present

## 2021-09-25 DIAGNOSIS — G629 Polyneuropathy, unspecified: Secondary | ICD-10-CM | POA: Diagnosis not present

## 2021-09-25 DIAGNOSIS — G9340 Encephalopathy, unspecified: Secondary | ICD-10-CM | POA: Diagnosis not present

## 2021-09-25 DIAGNOSIS — F418 Other specified anxiety disorders: Secondary | ICD-10-CM | POA: Diagnosis not present

## 2021-09-25 DIAGNOSIS — R4589 Other symptoms and signs involving emotional state: Secondary | ICD-10-CM | POA: Diagnosis not present

## 2021-09-25 DIAGNOSIS — Z8673 Personal history of transient ischemic attack (TIA), and cerebral infarction without residual deficits: Secondary | ICD-10-CM | POA: Diagnosis not present

## 2021-09-25 DIAGNOSIS — M797 Fibromyalgia: Secondary | ICD-10-CM | POA: Diagnosis not present

## 2021-09-25 DIAGNOSIS — R5381 Other malaise: Secondary | ICD-10-CM | POA: Diagnosis not present

## 2021-09-25 DIAGNOSIS — F329 Major depressive disorder, single episode, unspecified: Secondary | ICD-10-CM | POA: Diagnosis not present

## 2021-09-26 DIAGNOSIS — I10 Essential (primary) hypertension: Secondary | ICD-10-CM | POA: Diagnosis not present

## 2021-09-26 DIAGNOSIS — E559 Vitamin D deficiency, unspecified: Secondary | ICD-10-CM | POA: Diagnosis not present

## 2021-09-27 ENCOUNTER — Telehealth: Payer: Self-pay

## 2021-09-27 DIAGNOSIS — N390 Urinary tract infection, site not specified: Secondary | ICD-10-CM | POA: Diagnosis not present

## 2021-09-27 NOTE — Telephone Encounter (Signed)
Nurse at [facility] called stating they removed the catheter, patient was voiding fine until a few days ago and is now complaining of lower back pain and issues voiding.  The caregivers were instructed to reinsert the catheter and get a urine specimen.  Patient is refusing catheter and they wanted to know if they could do a in and out cath for sample.  Instructed to in and out cath for sample and completely drain the bladder to see if she is retaining.  Nurse aware and will call back. ?

## 2021-09-30 ENCOUNTER — Other Ambulatory Visit: Payer: Self-pay

## 2021-09-30 DIAGNOSIS — M62838 Other muscle spasm: Secondary | ICD-10-CM | POA: Diagnosis not present

## 2021-09-30 DIAGNOSIS — K589 Irritable bowel syndrome without diarrhea: Secondary | ICD-10-CM | POA: Diagnosis not present

## 2021-09-30 DIAGNOSIS — Z8619 Personal history of other infectious and parasitic diseases: Secondary | ICD-10-CM | POA: Diagnosis not present

## 2021-09-30 DIAGNOSIS — G629 Polyneuropathy, unspecified: Secondary | ICD-10-CM | POA: Diagnosis not present

## 2021-09-30 DIAGNOSIS — G2581 Restless legs syndrome: Secondary | ICD-10-CM | POA: Diagnosis not present

## 2021-09-30 DIAGNOSIS — Z87440 Personal history of urinary (tract) infections: Secondary | ICD-10-CM | POA: Diagnosis not present

## 2021-09-30 DIAGNOSIS — Z978 Presence of other specified devices: Secondary | ICD-10-CM | POA: Diagnosis not present

## 2021-09-30 DIAGNOSIS — Z8673 Personal history of transient ischemic attack (TIA), and cerebral infarction without residual deficits: Secondary | ICD-10-CM | POA: Diagnosis not present

## 2021-10-02 DIAGNOSIS — R330 Drug induced retention of urine: Secondary | ICD-10-CM | POA: Diagnosis not present

## 2021-10-03 DIAGNOSIS — F331 Major depressive disorder, recurrent, moderate: Secondary | ICD-10-CM | POA: Diagnosis not present

## 2021-10-03 DIAGNOSIS — Z8673 Personal history of transient ischemic attack (TIA), and cerebral infarction without residual deficits: Secondary | ICD-10-CM | POA: Diagnosis not present

## 2021-10-03 DIAGNOSIS — B86 Scabies: Secondary | ICD-10-CM | POA: Diagnosis not present

## 2021-10-11 ENCOUNTER — Encounter: Payer: Medicare PPO | Admitting: Physical Medicine & Rehabilitation

## 2021-10-16 DIAGNOSIS — G8929 Other chronic pain: Secondary | ICD-10-CM | POA: Diagnosis not present

## 2021-10-21 ENCOUNTER — Ambulatory Visit (INDEPENDENT_AMBULATORY_CARE_PROVIDER_SITE_OTHER): Payer: Medicare PPO | Admitting: Urology

## 2021-10-21 ENCOUNTER — Encounter: Payer: Self-pay | Admitting: Urology

## 2021-10-21 VITALS — BP 125/84 | HR 71

## 2021-10-21 DIAGNOSIS — H2511 Age-related nuclear cataract, right eye: Secondary | ICD-10-CM | POA: Diagnosis not present

## 2021-10-21 DIAGNOSIS — H353131 Nonexudative age-related macular degeneration, bilateral, early dry stage: Secondary | ICD-10-CM | POA: Diagnosis not present

## 2021-10-21 DIAGNOSIS — H524 Presbyopia: Secondary | ICD-10-CM | POA: Diagnosis not present

## 2021-10-21 DIAGNOSIS — N390 Urinary tract infection, site not specified: Secondary | ICD-10-CM | POA: Diagnosis not present

## 2021-10-21 DIAGNOSIS — N3281 Overactive bladder: Secondary | ICD-10-CM | POA: Diagnosis not present

## 2021-10-21 DIAGNOSIS — H25812 Combined forms of age-related cataract, left eye: Secondary | ICD-10-CM | POA: Diagnosis not present

## 2021-10-21 LAB — URINALYSIS, ROUTINE W REFLEX MICROSCOPIC
Bilirubin, UA: NEGATIVE
Glucose, UA: NEGATIVE
Ketones, UA: NEGATIVE
Nitrite, UA: POSITIVE — AB
Specific Gravity, UA: 1.02 (ref 1.005–1.030)
Urobilinogen, Ur: 0.2 mg/dL (ref 0.2–1.0)
pH, UA: 5.5 (ref 5.0–7.5)

## 2021-10-21 LAB — MICROSCOPIC EXAMINATION
Renal Epithel, UA: NONE SEEN /hpf
WBC, UA: >30 /hpf — AB (ref 0–5)

## 2021-10-21 LAB — BLADDER SCAN AMB NON-IMAGING: Scan Result: 114

## 2021-10-21 MED ORDER — OXYBUTYNIN CHLORIDE 5 MG PO TABS: 5.0000 mg | ORAL_TABLET | Freq: Two times a day (BID) | ORAL | 11 refills | Status: DC

## 2021-10-21 MED ORDER — CEFDINIR 300 MG PO CAPS: 300.0000 mg | ORAL_CAPSULE | Freq: Two times a day (BID) | ORAL | 0 refills | Status: DC

## 2021-10-21 NOTE — Progress Notes (Signed)
? ?10/21/2021 ?9:15 AM  ? ?Robin Blackburn ?Nov 08, 1945 ?[id number] ? ?Referring provider: Redmond School, MD ?[address] ?[place],  [place] [address] ? ?Followup recurrent UTI and incomplete emptying ? ? ?HPI: ?Robin Blackburn is a 76yo here for followup for incomplete emptying and recurrent UTI. UA today is nitrite positive. She has grown multidrug resistant bacteria. She had a foley placed but it was removed due to patient discomfort. She is currently on oxybutynin for urinary frequency which is worse at night. She has pelvic pressure currently. PVR 114cc. She was given omnicef last visit which she tolerate the first dose but it was discontinued de to her history of PCN allergy.  ? ? ?PMH: ?Past Medical History:  ?Diagnosis Date  ? Acid reflux   ? Anxiety   ? CVA (cerebral infarction) 02/19/2014  ? Acute left thalamic  ? Depression   ? Fibromyalgia   ? Hypertension   ? Neuropathy   ? Rheumatoid arthritis ([place])   ? Stroke [facility]) 02/17/14  ? ? ?Surgical History: ?Past Surgical History:  ?Procedure Laterality Date  ? ABDOMINAL HYSTERECTOMY    ? ANKLE RECONSTRUCTION    ? APPENDECTOMY    ? BACK SURGERY    ? CHOLECYSTECTOMY    ? KNEE SURGERY    ? ? ?Home Medications:  ?Allergies as of 10/21/2021   ? ?   Reactions  ? Demerol [meperidine] Anaphylaxis  ? Hydromorphone Anaphylaxis  ? Other reaction(s): Unknown  ? Keflex [cephalexin] Nausea And Vomiting  ? Penicillins Anaphylaxis  ? Has patient had a PCN reaction causing immediate rash, facial/tongue/throat swelling, SOB or lightheadedness with hypotension: Yes ?Has patient had a PCN reaction causing severe rash involving mucus membranes or skin necrosis: No ?Has patient had a PCN reaction that required hospitalization No ?Has patient had a PCN reaction occurring within the last 10 years: No ?If all of the above answers are "NO", then may proceed with Cephalosporin use. ?Other reaction(s): Unknown, Unknown  ? Sulfa Antibiotics Nausea And Vomiting  ? Other reaction(s): Unknown  ?  Acetaminophen   ? Other reaction(s): Unknown  ? Bisphosphonates   ? GI intolerance ?Other reaction(s): Unknown, Unknown  ? Codeine   ? Fentanyl Other (See Comments)  ? "felt like I had demons in my head" ?Other reaction(s): Unknown, Unknown  ? Influenza Virus Vaccine Other (See Comments)  ? Iodine   ? Other reaction(s): Unknown  ? Iohexol   ? Other reaction(s): Unknown, Unknown  ? Levofloxacin   ? Other reaction(s): Unknown, Unknown  ? Meperidine Hcl   ? Other reaction(s): Unknown, Unknown  ? Misc. Sulfonamide Containing Compounds   ? Other reaction(s): Unknown  ? Morphine   ? Other reaction(s): Unknown, Unknown  ? Oxycodone   ? Other reaction(s): Unknown  ? Oxycodone Hcl   ? Other reaction(s): Unknown  ? Oxytetracycline Nausea And Vomiting  ? Other reaction(s): Unknown  ? Percocet [oxycodone-acetaminophen] Swelling  ? Mouth swelling.  ? Shellfish Allergy   ? Glucosamine not an option  ? Ciprofloxacin Nausea Only, Rash  ? Other reaction(s): Unknown, Unknown  ? Levaquin [levofloxacin In D5w] Nausea And Vomiting, Rash  ? Morphine And Related Rash  ? ?  ? ?  ?Medication List  ?  ? ?  ? Accurate as of Oct 21, 2021  9:15 AM. If you have any questions, ask your nurse or doctor.  ?  ?  ? ?  ? ?acetaminophen 500 MG tablet ?Commonly known as: TYLENOL ?Take 500 mg by mouth every  6 (six) hours as needed for mild pain or moderate pain. ?  ?ALPRAZolam 0.5 MG tablet ?Commonly known as: Duanne Moron ?Take 1 tablet (0.5 mg total) by mouth 3 (three) times daily as needed for anxiety. ?  ?Xanax 0.25 MG tablet ?Generic drug: ALPRAZolam ?Take 0.25 mg by mouth every 8 (eight) hours. ?  ?benzocaine 10 % mucosal gel ?Commonly known as: ORAJEL ?Use as directed 1 application in the mouth or throat as needed for mouth pain. ?  ?busPIRone 15 MG tablet ?Commonly known as: BUSPAR ?Take 15 mg by mouth 2 (two) times daily. ?  ?chlorhexidine 0.12 % solution ?Commonly known as: PERIDEX ?Use as directed 15 mLs in the mouth or throat 2 (two) times daily. ?   ?Cholecalciferol 125 MCG (5000 UT) Tabs ?Take 1 tablet by mouth daily. ?  ?clobetasol cream 0.05 % ?Commonly known as: TEMOVATE ?Apply 1 application topically 2 (two) times daily. ?  ?clopidogrel 75 MG tablet ?Commonly known as: PLAVIX ?Take 75 mg by mouth at bedtime. ?  ?desloratadine 5 MG tablet ?Commonly known as: CLARINEX ?Take 5 mg by mouth every morning. ?  ?dicyclomine 20 MG tablet ?Commonly known as: BENTYL ?Take 20 mg by mouth every 6 (six) hours. Abdominal cramps ?  ?Ensure Max Protein Liqd ?Take 330 mLs (11 oz total) by mouth 2 (two) times daily. ?  ?gabapentin 300 MG capsule ?Commonly known as: NEURONTIN ?Take 300 mg by mouth 3 (three) times daily. ?  ?HYDROcodone-acetaminophen 5-325 MG tablet ?Commonly known as: NORCO/VICODIN ?Take 1 tablet by mouth every 6 (six) hours as needed for severe pain. ?  ?hydroxychloroquine 200 MG tablet ?Commonly known as: PLAQUENIL ?Take 200 mg by mouth daily. ?  ?lansoprazole 30 MG capsule ?Commonly known as: PREVACID ?Take 30 mg by mouth 2 (two) times daily. ?  ?lidocaine 5 % ?Commonly known as: LIDODERM ?Place 1 patch onto the skin daily. Remove & Discard patch within 12 hours or as directed by MD ?  ?loperamide 2 MG tablet ?Commonly known as: IMODIUM A-D ?Take 2 mg by mouth 4 (four) times daily as needed for diarrhea or loose stools. ?  ?Melatonin 2.5 MG Caps ?Take by mouth 2 (two) times daily. ?  ?meropenem 500 mg in sodium chloride 0.9 % 100 mL ?Inject 500 mg into the vein every 8 (eight) hours. ?  ?methocarbamol 750 MG tablet ?Commonly known as: ROBAXIN ?Take 1 tablet (750 mg total) by mouth every 6 (six) hours as needed for muscle spasms. ?  ?metoprolol tartrate 25 MG tablet ?Commonly known as: LOPRESSOR ?Take 1 tablet (25 mg total) by mouth 2 (two) times daily. ?  ?mirabegron ER 25 MG Tb24 tablet ?Commonly known as: MYRBETRIQ ?Take 1 tablet (25 mg total) by mouth daily. ?  ?ondansetron 4 MG tablet ?Commonly known as: ZOFRAN ?Take 4 mg by mouth every 6 (six) hours  as needed for nausea or vomiting. ?  ?oxybutynin 5 MG tablet ?Commonly known as: DITROPAN ?1 tablet ?  ?Propylene Glycol 0.6 % Soln ?Place 1 drop into both eyes daily. At noon. ?  ?QUEtiapine 25 MG tablet ?Commonly known as: SEROQUEL ?Take 0.5 tablets (12.5 mg total) by mouth 2 (two) times daily. ?  ?Restasis 0.05 % ophthalmic emulsion ?Generic drug: cycloSPORINE ?Place 1 drop into both eyes 2 (two) times daily. ?  ?saccharomyces boulardii 250 MG capsule ?Commonly known as: FLORASTOR ?Take 250 mg by mouth 2 (two) times daily. ?  ?simethicone 125 MG chewable tablet ?Commonly known as: MYLICON ?Chew 125 mg by  mouth every 6 (six) hours as needed for flatulence. ?  ?sodium chloride 0.65 % Soln nasal spray ?Commonly known as: OCEAN ?Place 1 spray into both nostrils as needed for congestion. ?What changed: when to take this ?  ?ticagrelor 90 MG Tabs tablet ?Commonly known as: BRILINTA ?Take by mouth 2 (two) times daily. ?  ?traZODone 50 MG tablet ?Commonly known as: DESYREL ?Take 50 mg by mouth at bedtime. ?  ?triamcinolone cream 0.5 % ?Commonly known as: KENALOG ?Apply topically 2 (two) times daily. Apply to affected area twice daily as directed (left ankle area) ?  ?trolamine salicylate 10 % cream ?Commonly known as: ASPERCREME ?Apply 1 application topically as needed for muscle pain. ?  ? ?  ? ? ?Allergies:  ?Allergies  ?Allergen Reactions  ? Demerol [Meperidine] Anaphylaxis  ? Hydromorphone Anaphylaxis  ?  Other reaction(s): Unknown  ? Keflex [Cephalexin] Nausea And Vomiting  ? Penicillins Anaphylaxis  ?  Has patient had a PCN reaction causing immediate rash, facial/tongue/throat swelling, SOB or lightheadedness with hypotension: Yes ?Has patient had a PCN reaction causing severe rash involving mucus membranes or skin necrosis: No ?Has patient had a PCN reaction that required hospitalization No ?Has patient had a PCN reaction occurring within the last 10 years: No ?If all of the above answers are "NO", then may  proceed with Cephalosporin use. ? ?Other reaction(s): Unknown, Unknown  ? Sulfa Antibiotics Nausea And Vomiting  ?  Other reaction(s): Unknown  ? Acetaminophen   ?  Other reaction(s): Unknown  ? Bisphosphonates

## 2021-10-21 NOTE — Patient Instructions (Signed)
Urinary Tract Infection, Adult  A urinary tract infection (UTI) is an infection of any part of the urinary tract. The urinary tract includes the kidneys, ureters, bladder, and urethra. These organs make, store, and get rid of urine in the body. An upper UTI affects the ureters and kidneys. A lower UTI affects the bladder and urethra. What are the causes? Most urinary tract infections are caused by bacteria in your genital area around your urethra, where urine leaves your body. These bacteria grow and cause inflammation of your urinary tract. What increases the risk? You are more likely to develop this condition if: You have a urinary catheter that stays in place. You are not able to control when you urinate or have a bowel movement (incontinence). You are female and you: Use a spermicide or diaphragm for birth control. Have low estrogen levels. Are pregnant. You have certain genes that increase your risk. You are sexually active. You take antibiotic medicines. You have a condition that causes your flow of urine to slow down, such as: An enlarged prostate, if you are female. Blockage in your urethra. A kidney stone. A nerve condition that affects your bladder control (neurogenic bladder). Not getting enough to drink, or not urinating often. You have certain medical conditions, such as: Diabetes. A weak disease-fighting system (immunesystem). Sickle cell disease. Gout. Spinal cord injury. What are the signs or symptoms? Symptoms of this condition include: Needing to urinate right away (urgency). Frequent urination. This may include small amounts of urine each time you urinate. Pain or burning with urination. Blood in the urine. Urine that smells bad or unusual. Trouble urinating. Cloudy urine. Vaginal discharge, if you are female. Pain in the abdomen or the lower back. You may also have: Vomiting or a decreased appetite. Confusion. Irritability or tiredness. A fever or  chills. Diarrhea. The first symptom in older adults may be confusion. In some cases, they may not have any symptoms until the infection has worsened. How is this diagnosed? This condition is diagnosed based on your medical history and a physical exam. You may also have other tests, including: Urine tests. Blood tests. Tests for STIs (sexually transmitted infections). If you have had more than one UTI, a cystoscopy or imaging studies may be done to determine the cause of the infections. How is this treated? Treatment for this condition includes: Antibiotic medicine. Over-the-counter medicines to treat discomfort. Drinking enough water to stay hydrated. If you have frequent infections or have other conditions such as a kidney stone, you may need to see a health care provider who specializes in the urinary tract (urologist). In rare cases, urinary tract infections can cause sepsis. Sepsis is a life-threatening condition that occurs when the body responds to an infection. Sepsis is treated in the hospital with IV antibiotics, fluids, and other medicines. Follow these instructions at home:  Medicines Take over-the-counter and prescription medicines only as told by your health care provider. If you were prescribed an antibiotic medicine, take it as told by your health care provider. Do not stop using the antibiotic even if you start to feel better. General instructions Make sure you: Empty your bladder often and completely. Do not hold urine for long periods of time. Empty your bladder after sex. Wipe from front to back after urinating or having a bowel movement if you are female. Use each tissue only one time when you wipe. Drink enough fluid to keep your urine pale yellow. Keep all follow-up visits. This is important. Contact a health   care provider if: Your symptoms do not get better after 1-2 days. Your symptoms go away and then return. Get help right away if: You have severe pain in  your back or your lower abdomen. You have a fever or chills. You have nausea or vomiting. Summary A urinary tract infection (UTI) is an infection of any part of the urinary tract, which includes the kidneys, ureters, bladder, and urethra. Most urinary tract infections are caused by bacteria in your genital area. Treatment for this condition often includes antibiotic medicines. If you were prescribed an antibiotic medicine, take it as told by your health care provider. Do not stop using the antibiotic even if you start to feel better. Keep all follow-up visits. This is important. This information is not intended to replace advice given to you by your health care provider. Make sure you discuss any questions you have with your health care provider. Document Revised: 01/06/2020 Document Reviewed: 01/06/2020 Elsevier Patient Education  2023 [employer] Inc[employer]  

## 2021-10-21 NOTE — Progress Notes (Signed)
post void residual= 114 

## 2021-10-24 DIAGNOSIS — B3789 Other sites of candidiasis: Secondary | ICD-10-CM | POA: Diagnosis not present

## 2021-10-28 ENCOUNTER — Telehealth: Payer: Self-pay

## 2021-10-28 NOTE — Telephone Encounter (Signed)
Received call from nurse [place] from [facility] concerning patient. Nurse states Robin Blackburn was prescribed 1 tablet po bid x 10 days but order was transmitted as 1 tablet po qd. Nurse calling to see what Dr. would like to do. Per Dr. Alyson Ingles patient is to continue Omnicef 1 tablet po qd x 10 days.  Nurse made aware via phone and order faxed.

## 2021-10-29 DIAGNOSIS — M797 Fibromyalgia: Secondary | ICD-10-CM | POA: Diagnosis not present

## 2021-10-29 DIAGNOSIS — I10 Essential (primary) hypertension: Secondary | ICD-10-CM | POA: Diagnosis not present

## 2021-10-29 DIAGNOSIS — Z8739 Personal history of other diseases of the musculoskeletal system and connective tissue: Secondary | ICD-10-CM | POA: Diagnosis not present

## 2021-10-29 DIAGNOSIS — M1990 Unspecified osteoarthritis, unspecified site: Secondary | ICD-10-CM | POA: Diagnosis not present

## 2021-10-29 DIAGNOSIS — M79643 Pain in unspecified hand: Secondary | ICD-10-CM | POA: Diagnosis not present

## 2021-10-29 DIAGNOSIS — F331 Major depressive disorder, recurrent, moderate: Secondary | ICD-10-CM | POA: Diagnosis not present

## 2021-10-29 DIAGNOSIS — Z79899 Other long term (current) drug therapy: Secondary | ICD-10-CM | POA: Diagnosis not present

## 2021-10-29 DIAGNOSIS — M79671 Pain in right foot: Secondary | ICD-10-CM | POA: Diagnosis not present

## 2021-10-30 DIAGNOSIS — R330 Drug induced retention of urine: Secondary | ICD-10-CM | POA: Diagnosis not present

## 2021-10-30 DIAGNOSIS — K769 Liver disease, unspecified: Secondary | ICD-10-CM | POA: Diagnosis not present

## 2021-10-30 DIAGNOSIS — D649 Anemia, unspecified: Secondary | ICD-10-CM | POA: Diagnosis not present

## 2021-11-07 DIAGNOSIS — B0089 Other herpesviral infection: Secondary | ICD-10-CM | POA: Diagnosis not present

## 2021-11-07 DIAGNOSIS — B86 Scabies: Secondary | ICD-10-CM | POA: Diagnosis not present

## 2021-11-13 DIAGNOSIS — F411 Generalized anxiety disorder: Secondary | ICD-10-CM | POA: Diagnosis not present

## 2021-11-15 DIAGNOSIS — L509 Urticaria, unspecified: Secondary | ICD-10-CM | POA: Diagnosis not present

## 2021-11-15 DIAGNOSIS — B372 Candidiasis of skin and nail: Secondary | ICD-10-CM | POA: Diagnosis not present

## 2021-11-19 DIAGNOSIS — I10 Essential (primary) hypertension: Secondary | ICD-10-CM | POA: Diagnosis not present

## 2021-11-19 DIAGNOSIS — F4541 Pain disorder exclusively related to psychological factors: Secondary | ICD-10-CM | POA: Diagnosis not present

## 2021-11-19 DIAGNOSIS — F339 Major depressive disorder, recurrent, unspecified: Secondary | ICD-10-CM | POA: Diagnosis not present

## 2021-11-20 DIAGNOSIS — G478 Other sleep disorders: Secondary | ICD-10-CM | POA: Diagnosis not present

## 2021-11-20 DIAGNOSIS — F411 Generalized anxiety disorder: Secondary | ICD-10-CM | POA: Diagnosis not present

## 2021-11-20 DIAGNOSIS — F331 Major depressive disorder, recurrent, moderate: Secondary | ICD-10-CM | POA: Diagnosis not present

## 2021-11-21 DIAGNOSIS — F411 Generalized anxiety disorder: Secondary | ICD-10-CM | POA: Diagnosis not present

## 2021-11-22 ENCOUNTER — Encounter: Payer: Self-pay | Admitting: Physical Medicine & Rehabilitation

## 2021-11-22 ENCOUNTER — Encounter: Payer: Medicare PPO | Attending: Physical Medicine & Rehabilitation | Admitting: Physical Medicine & Rehabilitation

## 2021-11-22 VITALS — BP 137/82 | HR 67 | Ht 66.0 in | Wt 160.0 lb

## 2021-11-22 DIAGNOSIS — M797 Fibromyalgia: Secondary | ICD-10-CM | POA: Diagnosis not present

## 2021-11-22 DIAGNOSIS — M48061 Spinal stenosis, lumbar region without neurogenic claudication: Secondary | ICD-10-CM | POA: Diagnosis not present

## 2021-11-22 MED ORDER — PREGABALIN 75 MG PO CAPS: 75.0000 mg | ORAL_CAPSULE | Freq: Three times a day (TID) | ORAL | 1 refills | Status: DC

## 2021-11-22 NOTE — Progress Notes (Signed)
Subjective:    Patient ID: Robin Blackburn, female    DOB: May 11, 1946, 76 y.o.   MRN: [id number]  HPI  76 year old female with history of chronic multifactorial pain.  She has been seen by rheumatology diagnosed with fibromyalgia, osteoarthritis as well as rheumatoid arthritis.  She was tried on multiple disease modifying antirheumatic drugs by Dr. Estanislado Pandy without any improvement in symptoms.  She has a history of lumbar spondylosis and has undergone lumbar decompressive surgery.  In addition the patient has had calcaneal fracture on the left as well as tarsal tunnel release on that side due to persistent foot and ankle pain. She currently resides in a skilled nursing facility for the last 2-1/2 years.  She has had episodes of altered mentation and work-up including MRI of the brain and CT of the brain demonstrated moderate to advanced periventricular white matter disease.  The patient has numerous drug allergies and has allergy to most narcotic analgesics as well as iodine and iohexol dye. The patient has followed up with Dr. Ellene Route who has done her spine surgery and per her report he did not feel that additional surgery would be of benefit.  The patient is nonambulatory requires assistance for dressing bathing and is incontinent.  She sees urology for overactive bladder  CLINICAL DATA:  76 year old female with altered mental status, recent UTI   EXAM: CT HEAD WITHOUT CONTRAST   TECHNIQUE: Contiguous axial images were obtained from the base of the skull through the vertex without intravenous contrast.   COMPARISON:  Prior head CT 07/30/2018   FINDINGS: Brain: No evidence of acute infarction, hemorrhage, hydrocephalus, extra-axial collection or mass lesion/mass effect. Similar pattern of advanced central and cortical atrophy with ex vacuo dilatation of the lateral ventricles. Confluent periventricular white matter hypoattenuation remains nonspecific but most consistent with advanced  chronic microvascular ischemic white matter change. Small lacunar infarcts in the left thalamus.   Vascular: No hyperdense vessel or unexpected calcification.   Skull: Normal. Negative for fracture or focal lesion.   Sinuses/Orbits: Chronic right maxillary sinusitis.   Other: None.   IMPRESSION: 1. No acute intracranial abnormality. 2. 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     Electronically Signed   By: Jacqulynn Cadet M.D.   On: 08/20/2018 16:38  CLINICAL DATA:  Alteration of consciousness.   EXAM: MRI HEAD WITHOUT CONTRAST   TECHNIQUE: Multiplanar, multiecho pulse sequences of the brain and surrounding structures were obtained without intravenous contrast.   COMPARISON:  CT head 04/02/2016.  MRI 02/20/2014.   FINDINGS: Brain: Generalized atrophy with prominent subarachnoid space and ventricle size with progression since 2015. Negative for hydrocephalus.   Negative for acute infarct. Chronic microvascular ischemic change in the white matter. Chronic infarct left thalamus. Negative for intracranial hemorrhage. Negative for mass or edema.   Vascular: Normal arterial flow voids.   Skull and upper cervical spine: Negative   Sinuses/Orbits: Mucosal edema in the paranasal sinuses. Mucous retention cyst left maxillary sinus. Mild mucosal edema in the mastoid sinus bilaterally.   Other: None   IMPRESSION: Moderate cerebral atrophy with progression since 2015. No evidence of hydrocephalus.   Moderate chronic microvascular ischemic change.  No acute infarct.     Electronically Signed   By: Franchot Gallo M.D.   On: 04/02/2016 07:42  Clinical Data: Prior calcaneal fracture.  History of remote prior  tarsal tunnel release.  Foot pain under the left arch.    MRI OF THE LEFT ANKLE  WITHOUT CONTRAST    Technique:  Multiplanar, multisequence MR imaging of the left ankle  was performed.  No  intravenous contrast was administered.    Comparison: 01/06/2009    Findings:  The Achilles tendon appears intact, with only a trace  amount of fluid in the pre-Achilles bursa.  Plantar fascia appears  intact.    Deformity of the calcaneus includes exaggerated depression of the  calcaneal neck talocalcaneal ligaments appear intact, without  significant edema in the sinus tarsi.  There is some resulting  prominence of the anterior process of the calcaneus, which has a  spur-like projection which extends further between the talus and  the cuboid than is often encountered, for example on image 9 of  series #5.  However, I do not discern osseous edema in the anterior  process of the calcaneus to indicate that there is active  impingement in this vicinity.    As noted on the CT scan, there is atrophy of the plantar foot  musculature.  There is a thickened appearance of anterior  components of the deltoid ligament and in particular of the  tibionavicular portion as shown on images 11-14 of series #7.  I  suspect that the spring ligament is torn based on image 14 of  series #9.    The calcaneal deformity results in prominence of the sustentaculum  tali as shown on image 11 of series #9, which abuts the flexor  digitorum longus tendon and which has a focus of subcortical edema  medially as shown on image 11 of series #9.  I do not discern a  mass or mass lesion in the released tarsal tunnel and eating given  the prominence of the sustentaculum tali this should not be  directly impinging on the tibial nerve or its branches of the.  Conceivably scarring in this vicinity could irritate the tibial  nerve or its branches.    The plafond and talar dome appear intact.  There is mild spurring  and degenerative arthropathy at the Lisfranc joint, particularly at  the articulation of the middle cuneiform and the base of the second  metatarsal.  There is some mild talonavicular degenerative   arthropathy as well as equivocal talonavicular varus angulation.    The inferior tibiofibular ligaments appear intact.  The talofibular  ligaments appear intact.  The calcaneofibular ligament is somewhat  indistinct but is likely intact.    IMPRESSION:    1.  Chronic deformity the calcaneus with depression of the  calcaneal neck and mild prominence of the sustentaculum tali.  There is degenerative subcortical cyst or edema in the  sustentaculum tali, which abuts the flexor digitorum longus tendon.  There is also mild prominence of the anterior process of the  calcaneus.  2.  Atrophy of the plantar foot musculature.  3.  Thickened and indistinct appearance of the tibionavicular  portion of the deltoid ligament, possibly from chronic tear.  4.  Suspected tear of the spring ligament.  The anterior  talofibular ligament appears intact.  5.  Despite the prominence of the sustentaculum tali, I do not  discern definite mass effect on the tibial nerve, although in the  setting of prior tarsal tunnel release I cannot exclude scarring  along the tibial nerve.  6.  Mild spurring and degenerative arthropathy at the Lisfranc  joint and at the talonavicular articulation.  There is equivocal  talonavicular varus angulation.   Provider: Reesa Chew  MRI CERVICAL SPINE WITHOUT CONTRAST    Technique:  Multiplanar and multiecho pulse sequences of the  cervical spine, to include the craniocervical junction and  cervicothoracic junction, were obtained according to standard  protocol without intravenous contrast.    Comparison: 04/04/2007.    Findings: Normal signal is present in the cervical and upper  thoracic spinal cord to the lowest image level, T3.  Marrow signal  and vertebral body heights are preserved.  Slight anterolisthesis  of C4-5 is stable.  There is straightening of the normal cervical  lordosis, similar to the prior exam.  Flow is present in the major  vascular structures  of the neck.  Individual disc levels are as  follows.    C2-3:  Negative.    C3-4:  Asymmetric right-sided disc bulging and uncovertebral  disease is stable.  There is mild right foraminal narrowing as a  result.    C4-5:  Slight anterolisthesis present.  There is progression of  right sided facet hypertrophy.  Edematous changes within the right  C4-5 facet joint are new.    C5-6:  Mild broad-based disc bulging is similar to the prior exam.  There is asymmetric left-sided facet hypertrophy.  Mild left  foraminal narrowing is stable.    C6-7:  A mild broad-based disc osteophyte complex is present.  There is no focal stenosis.    C7-T1:  Negative.    IMPRESSION:    1.  Stable uncovertebral disease and right foraminal narrowing at  C3-4.  2.  Progressive facet degenerative change and new edematous changes  within the facet joint on the right at C4-5.  Mild right foraminal  narrowing is new.  3.  Stable disc bulging and mild foraminal narrowing at C5-6.  4.  Stable disc bulging at C6-7.   Provider: Mancel Bale   Clinical Data: Low back pain radiating into the left gluteal region  and both legs.  Bilateral leg numbness and weakness.  History of  three lumbar spinal surgeries.   MRI LUMBAR SPINE WITHOUT AND WITH CONTRAST   Technique:  Multiplanar and multiecho pulse sequences of the lumbar  spine were obtained without and with intravenous contrast.   Contrast: 25m MULTIHANCE GADOBENATE DIMEGLUMINE 529 MG/ML IV SOLN   [facility] creatinine [facility]  [address]  Results:  BUN 5 mg/dL,  Creatinine 0.6 mg/dL. GFR of 100.   Comparison: 12/13/2010 radiographs.  MRI 06/15/2009.   Findings: Levoconvex lumbar scoliosis is present with the apex at  L2-L3.  Vertebral body height is preserved.  Degenerative endplate  changes are present on both sides of the L1-L2 disc space.  L1-L2  vacuum disc.  Sacral Tarlov cyst noted.  The  spinal cord terminates  posterior to T12-L1.  Paraspinal soft tissues appear within normal  limits. L4 and L5 vertebral body hemangiomata are noted.   T12-L1:  Unchanged left eccentric broad-based posterior disc bulge.  Minimal impression on the thecal sac.  Foramina patent.  L1-L2:  Progressive disc degeneration with degenerative endplate  changes.  Unchanged L1 vertebral body hemangioma.  Right lateral  disc protrusion and osteophyte potentially irritate the exiting  right L1 nerve.  Central canal and lateral recesses patent.  Both  foramina are patent.  L2-L3:  Disc desiccation and degeneration.  Mild central stenosis.  Trace anterolisthesis of L2 on L3 is unchanged.  Mild central  stenosis.  Foramina patent.  Lateral recesses patent.  L3-L4: Unchanged right lateral disc protrusion.  Disc desiccation  and degeneration with right greater  than left bilateral facet  hypertrophy.  Mild ligamentum flavum redundancy.  Central canal and  lateral recesses patent.  Foramina appear adequately patent.  L4-L5: Postoperative changes of decompression.  Probable bilateral  laminotomies.  Endplate osteophytes and shallow broad-based  posterior disc bulge.  Central canal is patent.  Bilateral facet  degeneration.  Lateral recesses patent.  Mild bilateral foraminal  stenosis associated with endplate osteophytes unchanged.  L5-S1:  Disc desiccation and degeneration.  Postoperative changes  are present with left laminotomy.  Enhancing epidural fibrosis  surrounds the left side of the thecal sac and the descending left  S1 nerve.  This is not appreciably changed compared to prior.  Left  eccentric disc bulge is present in the left lateral recess,  potentially affecting the descending left S1 nerve.  This also  appears similar to prior exam.  Bilateral facet arthrosis is  present.  Left foraminal stenosis secondary endplate osteophytes,  facet spurring and bulging disc is unchanged.   IMPRESSION:    1.  Progressive L1-L2 degenerative disease with degenerative  endplate edema and vacuum disc.  New right lateral disc protrusion  potentially irritating the exiting right L1 nerve.  2.  Unchanged appearance of L5-S1 status post decompression with  left foraminal and left lateral recess stenosis associated with  bulging disc and osteophytes.  3.  Unchanged appearance of L4-L5 with mild bilateral foraminal  narrowing.   Original Report Authenticated By: Dereck Ligas, M.D.   Pain Inventory Average Pain 8 Pain Right Now 9 My pain is constant, burning, dull, stabbing, tingling, and aching  In the last 24 hours, has pain interfered with the following? General activity 5 Relation with others 0 Enjoyment of life 10 What TIME of day is your pain at its worst? daytime and evening Sleep (in general) Poor  Pain is worse with: bending, standing, and some activites Pain improves with: rest, medication, and TENS Relief from Meds: 5   ability to climb steps?  no do you drive?  no use a wheelchair needs help with transfers Do you have any goals in this area?  yes  retired I need assistance with the following:  toileting, meal prep, household duties, shopping, and Lives in a nursing facility Do you have any goals in this area?  yes  bladder control problems weakness numbness tingling trouble walking spasms dizziness depression anxiety  Any changes since last visit?  no  Any changes since last visit?  no    Family History  Problem Relation Age of Onset   Hypertension Father    Transient ischemic attack Father    Hypertension Brother    CVA Maternal Grandfather    Leukemia Brother    Social History   Socioeconomic History   Marital status: Divorced    Spouse name: Not on file   Number of children: Not on file   Years of education: Not on file   Highest education level: Not on file  Occupational History   Not on file  Tobacco Use   Smoking status: Never    Smokeless tobacco: Never  Vaping Use   Vaping Use: Never used  Substance and Sexual Activity   Alcohol use: No   Drug use: No   Sexual activity: Not on file  Other Topics Concern   Not on file  Social History Narrative   Not on file   Social Determinants of Health   Financial Resource Strain: Not on file  Food Insecurity: Not on file  Transportation Needs: Not  on file  Physical Activity: Not on file  Stress: Not on file  Social Connections: Not on file   Past Surgical History:  Procedure Laterality Date   ABDOMINAL HYSTERECTOMY     ANKLE RECONSTRUCTION     APPENDECTOMY     BACK SURGERY     CHOLECYSTECTOMY     KNEE SURGERY     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  Neurological:  Positive for dizziness, weakness and numbness.       Spasms  All other systems reviewed and are negative.      Objective:   Physical Exam Vitals and nursing note reviewed.  Constitutional:      Appearance: She is obese.  HENT:     Head: Normocephalic and atraumatic.  Eyes:     Extraocular Movements: Extraocular movements intact.     Conjunctiva/sclera: Conjunctivae normal.     Pupils: Pupils are equal, round, and reactive to light.  Neurological:     Mental Status: She is alert.     Cranial Nerves: No dysarthria.     Sensory: Sensory deficit present.     Motor: Weakness present.     Coordination: Coordination normal.     Gait: Gait abnormal.     Comments: Motor  strength is 4/5 bilateral deltoid bicep tricep 3 - bilateral hip flexor knee extensor ankle dorsiflexor Negative straight leg raising bilaterally Reduced sensation bilateral feet. Nonambulatory unable to stand unassisted  Psychiatric:        Attention and Perception: Attention normal.        Mood and Affect: Mood and affect normal.        Speech: Speech normal.        Behavior: Behavior normal.        Thought Content: Thought content normal.        Cognition and Memory: Memory is impaired.    Tenderness palpation over the cervical thoracic and lumbar paraspinal area as well as periscapular area.  Also has tenderness over the greater trochanter of the hips.       Assessment & Plan:   1.  Chronic widespread pain consistent with underlying diagnosis of fibromyalgia syndrome.  She has been on gabapentin without much relief.  We will trial pregabalin starting at 75 mg 3 times daily and titrate upward from there depending on tolerance.  We discussed that drowsiness is the most common side effect.  If she develops this we may need to just give her nighttime dose or even perhaps going down on dose to 25 or 50 mg.  Follow-up with nurse practitioner in 1 month  Do not think she would be a good candidate date for narcotic analgesics given multiple allergies in this classification.  Could possibly trial buprenorphine or  tramadol given that they are dissimilar from the other medications.  2.  Lumbar spinal stenosis with sciatica.  As discussed she does have allergy to iohexol which is needed to perform epidural injection safely.  Therefore do not think she would be a good candidate for an epidural steroid injection. The Lyrica should help address some of the sciatic discomfort.

## 2021-11-22 NOTE — Patient Instructions (Signed)
May stop Gabapentin once pregabalin is available

## 2021-11-25 DIAGNOSIS — F411 Generalized anxiety disorder: Secondary | ICD-10-CM | POA: Diagnosis not present

## 2021-11-27 DIAGNOSIS — R330 Drug induced retention of urine: Secondary | ICD-10-CM | POA: Diagnosis not present

## 2021-11-28 DIAGNOSIS — M62838 Other muscle spasm: Secondary | ICD-10-CM | POA: Diagnosis not present

## 2021-11-28 DIAGNOSIS — J302 Other seasonal allergic rhinitis: Secondary | ICD-10-CM | POA: Diagnosis not present

## 2021-11-28 DIAGNOSIS — K219 Gastro-esophageal reflux disease without esophagitis: Secondary | ICD-10-CM | POA: Diagnosis not present

## 2021-11-28 DIAGNOSIS — M069 Rheumatoid arthritis, unspecified: Secondary | ICD-10-CM | POA: Diagnosis not present

## 2021-11-28 DIAGNOSIS — E559 Vitamin D deficiency, unspecified: Secondary | ICD-10-CM | POA: Diagnosis not present

## 2021-11-28 DIAGNOSIS — I10 Essential (primary) hypertension: Secondary | ICD-10-CM | POA: Diagnosis not present

## 2021-11-28 DIAGNOSIS — F419 Anxiety disorder, unspecified: Secondary | ICD-10-CM | POA: Diagnosis not present

## 2021-11-28 DIAGNOSIS — R109 Unspecified abdominal pain: Secondary | ICD-10-CM | POA: Diagnosis not present

## 2021-12-03 DIAGNOSIS — F411 Generalized anxiety disorder: Secondary | ICD-10-CM | POA: Diagnosis not present

## 2021-12-03 DIAGNOSIS — F331 Major depressive disorder, recurrent, moderate: Secondary | ICD-10-CM | POA: Diagnosis not present

## 2021-12-03 DIAGNOSIS — G478 Other sleep disorders: Secondary | ICD-10-CM | POA: Diagnosis not present

## 2021-12-04 DIAGNOSIS — M79675 Pain in left toe(s): Secondary | ICD-10-CM | POA: Diagnosis not present

## 2021-12-04 DIAGNOSIS — M79674 Pain in right toe(s): Secondary | ICD-10-CM | POA: Diagnosis not present

## 2021-12-04 DIAGNOSIS — B351 Tinea unguium: Secondary | ICD-10-CM | POA: Diagnosis not present

## 2021-12-04 DIAGNOSIS — N390 Urinary tract infection, site not specified: Secondary | ICD-10-CM | POA: Diagnosis not present

## 2021-12-06 ENCOUNTER — Ambulatory Visit: Payer: Medicare PPO | Admitting: Urology

## 2021-12-08 DIAGNOSIS — N390 Urinary tract infection, site not specified: Secondary | ICD-10-CM | POA: Diagnosis not present

## 2021-12-09 DIAGNOSIS — I10 Essential (primary) hypertension: Secondary | ICD-10-CM | POA: Diagnosis not present

## 2021-12-09 DIAGNOSIS — R3989 Other symptoms and signs involving the genitourinary system: Secondary | ICD-10-CM | POA: Diagnosis not present

## 2021-12-09 DIAGNOSIS — N3090 Cystitis, unspecified without hematuria: Secondary | ICD-10-CM | POA: Diagnosis not present

## 2021-12-11 ENCOUNTER — Ambulatory Visit (INDEPENDENT_AMBULATORY_CARE_PROVIDER_SITE_OTHER): Payer: Medicare PPO | Admitting: Urology

## 2021-12-11 ENCOUNTER — Encounter: Payer: Self-pay | Admitting: Urology

## 2021-12-11 VITALS — BP 127/86 | HR 87

## 2021-12-11 DIAGNOSIS — N3281 Overactive bladder: Secondary | ICD-10-CM

## 2021-12-11 DIAGNOSIS — F411 Generalized anxiety disorder: Secondary | ICD-10-CM | POA: Diagnosis not present

## 2021-12-11 DIAGNOSIS — N390 Urinary tract infection, site not specified: Secondary | ICD-10-CM

## 2021-12-11 LAB — MICROSCOPIC EXAMINATION
RBC, Urine: >30 /hpf — AB (ref 0–2)
Renal Epithel, UA: NONE SEEN /hpf
WBC, UA: >30 /hpf — AB (ref 0–5)

## 2021-12-11 LAB — URINALYSIS, ROUTINE W REFLEX MICROSCOPIC
Bilirubin, UA: NEGATIVE
Glucose, UA: NEGATIVE
Ketones, UA: NEGATIVE
Nitrite, UA: POSITIVE — AB
Specific Gravity, UA: 1.02 (ref 1.005–1.030)
Urobilinogen, Ur: 0.2 mg/dL (ref 0.2–1.0)
pH, UA: 5 (ref 5.0–7.5)

## 2021-12-11 MED ORDER — OXYBUTYNIN CHLORIDE 5 MG PO TABS: 5.0000 mg | ORAL_TABLET | Freq: Two times a day (BID) | ORAL | 11 refills | Status: DC

## 2021-12-11 MED ORDER — CEFDINIR 300 MG PO CAPS: 300.0000 mg | ORAL_CAPSULE | Freq: Two times a day (BID) | ORAL | 0 refills | Status: DC

## 2021-12-11 NOTE — Progress Notes (Signed)
12/11/2021 2:23 PM   Robin Blackburn 1945-10-14 [id number]  Referring provider: Redmond School, MD [address] [place],  [place] [address]  Followup recurrent uTI   HPI: Ms Record is a 76yo here for followup for recurrent UTI and OAB. Starting 2 weeks ago she developed dysuria, urinary frequenyc and pelvic pressure. UA is concerning for infection. She took omnicef last visit which resolved her dysuria. UA is concerning for infection. She denies any urge incontinence.    PMH: 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   Depression    Fibromyalgia    Hypertension    Neuropathy    Rheumatoid arthritis ([facility])    Stroke ([facility]) 02/17/14    Surgical History: Past Surgical History:  Procedure Laterality Date   ABDOMINAL HYSTERECTOMY     ANKLE RECONSTRUCTION     APPENDECTOMY     BACK SURGERY     CHOLECYSTECTOMY     KNEE SURGERY      Home Medications:  Allergies as of 12/11/2021       Reactions   Demerol [meperidine] Anaphylaxis   Hydromorphone Anaphylaxis   Other reaction(s): Unknown Other reaction(s): Unknown   Keflex [cephalexin] Nausea And Vomiting   Penicillins Anaphylaxis   Has patient had a PCN reaction causing immediate rash, facial/tongue/throat swelling, SOB or lightheadedness with hypotension: Yes Has patient had a PCN reaction causing severe rash involving mucus membranes or skin necrosis: No Has patient had a PCN reaction that required hospitalization No Has patient had a PCN reaction occurring within the last 10 years: No If all of the above answers are "NO", then may proceed with Cephalosporin use. Other reaction(s): Unknown, Unknown Other reaction(s): Unknown, Unknown   Sulfa Antibiotics Nausea And Vomiting   Other reaction(s): Unknown Other reaction(s): Unknown Other reaction(s): Unknown   Acetaminophen    Other reaction(s): Unknown Other reaction(s): Unknown    Afluria Preservative Free [influenza Virus Vacc Split Pf]    Other reaction(s): Unknown   Bisphosphonates    GI intolerance Other reaction(s): Unknown, Unknown Other reaction(s): Unknown, Unknown   Codeine    Fentanyl Other (See Comments)   "felt like I had demons in my head" Other reaction(s): Unknown, Unknown Other reaction(s): Unknown, Unknown   Influenza Virus Vaccine Other (See Comments)   Other reaction(s): Unknown   Iodine    Other reaction(s): Unknown Other reaction(s): Unknown   Iohexol    Other reaction(s): Unknown, Unknown Other reaction(s): Unknown, Unknown   Levofloxacin    Other reaction(s): Unknown, Unknown Other reaction(s): Unknown, Unknown   Meperidine Hcl    Other reaction(s): Unknown, Unknown Other reaction(s): Unknown, Unknown   Misc. Sulfonamide Containing Compounds    Other reaction(s): Unknown   Morphine    Other reaction(s): Unknown, Unknown Other reaction(s): Unknown, Unknown   Oxycodone    Other reaction(s): Unknown Other reaction(s): Unknown   Oxycodone Hcl    Other reaction(s): Unknown Other reaction(s): Unknown   Oxytetracycline Nausea And Vomiting   Other reaction(s): Unknown Other reaction(s): Unknown   Penicillamine    Percocet [oxycodone-acetaminophen] Swelling   Mouth swelling.   Shellfish Allergy    Glucosamine not an option   Ciprofloxacin Nausea Only, Rash   Other reaction(s): Unknown, Unknown Other reaction(s): Unknown, Unknown   Levaquin [levofloxacin In D5w] Nausea And Vomiting, Rash   Morphine And Related Rash        Medication List  Accurate as of December 11, 2021  2:23 PM. If you have any questions, ask your nurse or doctor.          acetaminophen 500 MG tablet Commonly known as: TYLENOL Take 500 mg by mouth every 6 (six) hours as needed for mild pain or moderate pain.   benzocaine 10 % mucosal gel Commonly known as: ORAJEL Use as directed 1 application in the mouth or throat as needed for mouth pain.    busPIRone 15 MG tablet Commonly known as: BUSPAR Take 15 mg by mouth 2 (two) times daily.   cefdinir 300 MG capsule Commonly known as: OMNICEF Take 1 capsule (300 mg total) by mouth 2 (two) times daily.   chlorhexidine 0.12 % solution Commonly known as: PERIDEX Use as directed 15 mLs in the mouth or throat 2 (two) times daily.   Cholecalciferol 125 MCG (5000 UT) Tabs Take 1 tablet by mouth daily.   clobetasol cream 0.05 % Commonly known as: TEMOVATE Apply 1 application topically 2 (two) times daily.   clopidogrel 75 MG tablet Commonly known as: PLAVIX Take 75 mg by mouth at bedtime.   desloratadine 5 MG tablet Commonly known as: CLARINEX Take 5 mg by mouth every morning.   dicyclomine 20 MG tablet Commonly known as: BENTYL Take 20 mg by mouth every 6 (six) hours. Abdominal cramps   Enbrel 50 MG/ML injection Generic drug: etanercept Inject into the skin.   Ensure Max Protein Liqd Take 330 mLs (11 oz total) by mouth 2 (two) times daily.   fluconazole 200 MG tablet Commonly known as: DIFLUCAN   HYDROcodone-acetaminophen 5-325 MG tablet Commonly known as: NORCO/VICODIN Take 1 tablet by mouth every 6 (six) hours as needed for severe pain.   hydroxychloroquine 200 MG tablet Commonly known as: PLAQUENIL Take 200 mg by mouth daily.   ivermectin 3 MG Tabs tablet Commonly known as: STROMECTOL Take by mouth.   lansoprazole 30 MG capsule Commonly known as: PREVACID Take 30 mg by mouth 2 (two) times daily.   leflunomide 10 MG tablet Commonly known as: ARAVA 1 tablet   lidocaine 5 % Commonly known as: LIDODERM Place 1 patch onto the skin daily. Remove & Discard patch within 12 hours or as directed by MD   loperamide 2 MG tablet Commonly known as: IMODIUM A-D Take 2 mg by mouth 4 (four) times daily as needed for diarrhea or loose stools.   meclizine 25 MG tablet Commonly known as: ANTIVERT meclizine 25 mg tablet   Melatonin 2.5 MG Caps Take by mouth 2  (two) times daily.   meropenem 500 mg in sodium chloride 0.9 % 100 mL Inject 500 mg into the vein every 8 (eight) hours.   methocarbamol 750 MG tablet Commonly known as: ROBAXIN Take 1 tablet (750 mg total) by mouth every 6 (six) hours as needed for muscle spasms.   metoprolol tartrate 25 MG tablet Commonly known as: LOPRESSOR Take 1 tablet (25 mg total) by mouth 2 (two) times daily.   mirabegron ER 25 MG Tb24 tablet Commonly known as: MYRBETRIQ Take 1 tablet (25 mg total) by mouth daily.   mirtazapine 7.5 MG tablet Commonly known as: REMERON Take by mouth.   nystatin powder Commonly known as: MYCOSTATIN/NYSTOP Apply topically.   ondansetron 4 MG disintegrating tablet Commonly known as: ZOFRAN-ODT 1 tablet on the tongue and allow to dissolve   ondansetron 4 MG tablet Commonly known as: ZOFRAN Take 4 mg by mouth every 6 (six) hours as needed for nausea  or vomiting.   oxybutynin 5 MG tablet Commonly known as: DITROPAN Take 1 tablet (5 mg total) by mouth 2 (two) times daily.   PARoxetine 10 MG tablet Commonly known as: PAXIL paroxetine 10 mg tablet   permethrin 5 % cream Commonly known as: ELIMITE Apply topically.   pregabalin 75 MG capsule Commonly known as: LYRICA Take 1 capsule (75 mg total) by mouth 3 (three) times daily.   Propylene Glycol 0.6 % Soln Place 1 drop into both eyes daily. At noon.   QUEtiapine 25 MG tablet Commonly known as: SEROQUEL Take 0.5 tablets (12.5 mg total) by mouth 2 (two) times daily.   Restasis 0.05 % ophthalmic emulsion Generic drug: cycloSPORINE Place 1 drop into both eyes 2 (two) times daily.   saccharomyces boulardii 250 MG capsule Commonly known as: FLORASTOR Take 250 mg by mouth 2 (two) times daily.   simethicone 125 MG chewable tablet Commonly known as: MYLICON Chew 536 mg by mouth every 6 (six) hours as needed for flatulence.   sodium chloride 0.65 % Soln nasal spray Commonly known as: OCEAN Place 1 spray into  both nostrils as needed for congestion. What changed: when to take this   ticagrelor 90 MG Tabs tablet Commonly known as: BRILINTA Take by mouth 2 (two) times daily.   traMADol 50 MG tablet Commonly known as: ULTRAM SMARTSIG:0.5 Tablet(s) By Mouth Every 4 Hours PRN   traZODone 50 MG tablet Commonly known as: DESYREL Take 50 mg by mouth at bedtime.   triamcinolone cream 0.5 % Commonly known as: KENALOG Apply topically 2 (two) times daily. Apply to affected area twice daily as directed (left ankle area)   triamcinolone cream 0.1 % Commonly known as: KENALOG Apply topically.   trolamine salicylate 10 % cream Commonly known as: ASPERCREME Apply 1 application topically as needed for muscle pain.   valACYclovir 1000 MG tablet Commonly known as: VALTREX Take 1,000 mg by mouth daily.   vancomycin 250 MG capsule Commonly known as: VANCOCIN Take by mouth.   Xanax 0.25 MG tablet Generic drug: ALPRAZolam 1 tablet        Allergies:  Allergies  Allergen Reactions   Demerol [Meperidine] Anaphylaxis   Hydromorphone Anaphylaxis    Other reaction(s): Unknown Other reaction(s): Unknown   Keflex [Cephalexin] Nausea And Vomiting   Penicillins Anaphylaxis    Has patient had a PCN reaction causing immediate rash, facial/tongue/throat swelling, SOB or lightheadedness with hypotension: Yes Has patient had a PCN reaction causing severe rash involving mucus membranes or skin necrosis: No Has patient had a PCN reaction that required hospitalization No Has patient had a PCN reaction occurring within the last 10 years: No If all of the above answers are "NO", then may proceed with Cephalosporin use.  Other reaction(s): Unknown, Unknown Other reaction(s): Unknown, Unknown   Sulfa Antibiotics Nausea And Vomiting    Other reaction(s): Unknown Other reaction(s): Unknown Other reaction(s): Unknown   Acetaminophen     Other reaction(s): Unknown Other reaction(s): Unknown   Afluria  Preservative Free [Influenza Virus Vacc Split Pf]     Other reaction(s): Unknown   Bisphosphonates     GI intolerance Other reaction(s): Unknown, Unknown Other reaction(s): Unknown, Unknown   Codeine    Fentanyl Other (See Comments)    "felt like I had demons in my head" Other reaction(s): Unknown, Unknown Other reaction(s): Unknown, Unknown   Influenza Virus Vaccine Other (See Comments)    Other reaction(s): Unknown   Iodine     Other reaction(s): Unknown  Other reaction(s): Unknown   Iohexol     Other reaction(s): Unknown, Unknown Other reaction(s): Unknown, Unknown   Levofloxacin     Other reaction(s): Unknown, Unknown Other reaction(s): Unknown, Unknown   Meperidine Hcl     Other reaction(s): Unknown, Unknown Other reaction(s): Unknown, Unknown   Misc. Sulfonamide Containing Compounds     Other reaction(s): Unknown   Morphine     Other reaction(s): Unknown, Unknown Other reaction(s): Unknown, Unknown   Oxycodone     Other reaction(s): Unknown Other reaction(s): Unknown   Oxycodone Hcl     Other reaction(s): Unknown Other reaction(s): Unknown   Oxytetracycline Nausea And Vomiting    Other reaction(s): Unknown Other reaction(s): Unknown   Penicillamine    Percocet [Oxycodone-Acetaminophen] Swelling    Mouth swelling.   Shellfish Allergy     Glucosamine not an option   Ciprofloxacin Nausea Only and Rash    Other reaction(s): Unknown, Unknown Other reaction(s): Unknown, Unknown   Levaquin [Levofloxacin In D5w] Nausea And Vomiting and Rash   Morphine And Related Rash    Family History: Family History  Problem Relation Age of Onset   Hypertension Father    Transient ischemic attack Father    Hypertension Brother    CVA Maternal Grandfather    Leukemia Brother     Social History:  reports that she has never smoked. She has never used smokeless tobacco. She reports that she does not drink alcohol and does not use drugs.  ROS: All other review of systems were  reviewed and are negative except what is noted above in HPI  Physical Exam: BP 127/86   Pulse 87   Constitutional:  Alert and oriented, No acute distress. HEENT: [place] AT, moist mucus membranes.  Trachea midline, no masses. Cardiovascular: No clubbing, cyanosis, or edema. Respiratory: Normal respiratory effort, no increased work of breathing. GI: Abdomen is soft, nontender, nondistended, no abdominal masses GU: No CVA tenderness.  Lymph: No cervical or inguinal lymphadenopathy. Skin: No rashes, bruises or suspicious lesions. Neurologic: Grossly intact, no focal deficits, moving all 4 extremities. Psychiatric: Normal mood and affect.  Laboratory Data: Lab Results  Component Value Date   WBC 11.3 (H) 08/23/2018   HGB 12.6 08/23/2018   HCT 41.5 08/23/2018   MCV 95.8 08/23/2018   PLT 422 (H) 08/23/2018    Lab Results  Component Value Date   CREATININE 0.78 08/23/2018    No results found for: "PSA"  No results found for: "TESTOSTERONE"  Lab Results  Component Value Date   HGBA1C 6.1 (H) 08/15/2015    Urinalysis    Component Value Date/Time   COLORURINE STRAW (A) 08/20/2018 1225   APPEARANCEUR Cloudy (A) 10/21/2021 1046   LABSPEC 1.004 (L) 08/20/2018 1225   PHURINE 6.0 08/20/2018 1225   GLUCOSEU Negative 10/21/2021 1046   HGBUR MODERATE (A) 08/20/2018 1225   BILIRUBINUR Negative 10/21/2021 1046   KETONESUR NEGATIVE 08/20/2018 1225   PROTEINUR 2+ (A) 10/21/2021 1046   PROTEINUR NEGATIVE 08/20/2018 1225   UROBILINOGEN 0.2 04/10/2015 2212   NITRITE Positive (A) 10/21/2021 1046   NITRITE NEGATIVE 08/20/2018 1225   LEUKOCYTESUR 3+ (A) 10/21/2021 1046   LEUKOCYTESUR NEGATIVE 08/20/2018 1225    Lab Results  Component Value Date   LABMICR See below: 10/21/2021   WBCUA >30 (A) 10/21/2021   LABEPIT 0-10 10/21/2021   MUCUS Present 10/21/2021   BACTERIA Many (A) 10/21/2021    Pertinent Imaging:  No results found for this or any previous visit.  Results for orders  placed during the hospital encounter of 02/19/14  US Venous Img Lower Bilateral  Narrative CLINICAL DATA:  Bilateral lower extremity pain and swelling  EXAM: BILATERAL LOWER EXTREMITY VENOUS DOPPLER ULTRASOUND  TECHNIQUE: Gray-scale sonography with graded compression, as well as color Doppler and duplex ultrasound were performed to evaluate the lower extremity deep venous systems from the level of the common femoral vein and including the common femoral, femoral, profunda femoral, popliteal and calf veins including the posterior tibial, peroneal and gastrocnemius veins when visible. The superficial great saphenous vein was also interrogated. Spectral Doppler was utilized to evaluate flow at rest and with distal augmentation maneuvers in the common femoral, femoral and popliteal veins.  COMPARISON:  Prior left lower extremity duplex venous ultrasound 02/24/2008  FINDINGS: RIGHT LOWER EXTREMITY  Common Femoral Vein: No evidence of thrombus. Normal compressibility, respiratory phasicity and response to augmentation.  Saphenofemoral Junction: No evidence of thrombus. Normal compressibility and flow on color Doppler imaging.  Profunda Femoral Vein: No evidence of thrombus. Normal compressibility and flow on color Doppler imaging.  Femoral Vein: No evidence of thrombus. Normal compressibility, respiratory phasicity and response to augmentation.  Popliteal Vein: No evidence of thrombus. Normal compressibility, respiratory phasicity and response to augmentation.  Calf Veins: No evidence of thrombus. Normal compressibility and flow on color Doppler imaging.  Superficial Great Saphenous Vein: No evidence of thrombus. Normal compressibility and flow on color Doppler imaging.  Venous Reflux:  None.  Other Findings:  None.  LEFT LOWER EXTREMITY  Common Femoral Vein: No evidence of thrombus. Normal compressibility, respiratory phasicity and response to  augmentation.  Saphenofemoral Junction: No evidence of thrombus. Normal compressibility and flow on color Doppler imaging.  Profunda Femoral Vein: No evidence of thrombus. Normal compressibility and flow on color Doppler imaging.  Femoral Vein: No evidence of thrombus. Normal compressibility, respiratory phasicity and response to augmentation.  Popliteal Vein: No evidence of thrombus. Normal compressibility, respiratory phasicity and response to augmentation.  Calf Veins: No evidence of thrombus. Normal compressibility and flow on color Doppler imaging.  Superficial Great Saphenous Vein: No evidence of thrombus. Normal compressibility and flow on color Doppler imaging.  Venous Reflux:  None.  Other Findings:  None.  IMPRESSION: No evidence of deep venous thrombosis.   Electronically Signed By: Jacqulynn Cadet M.D. On: 02/21/2014 07:47  No results found for this or any previous visit.  No results found for this or any previous visit.  No results found for this or any previous visit.  No results found for this or any previous visit.  No results found for this or any previous visit.  No results found for this or any previous visit.   Assessment & Plan:    1. Recurrent UTI -Urine for culture -omincef BID for 10 days  - Urinalysis, Routine w reflex microscopic - BLADDER SCAN AMB NON-IMAGING  2. OAB (overactive bladder) -contineu oxybutynin BID.  - Urinalysis, Routine w reflex microscopic - BLADDER SCAN AMB NON-IMAGING   No follow-ups on file.  Nicolette Bang, MD  [facility] [place]

## 2021-12-11 NOTE — Progress Notes (Signed)
post void residual = 40

## 2021-12-11 NOTE — Patient Instructions (Signed)
Urinary Tract Infection, Adult A urinary tract infection (UTI) is an infection of any part of the urinary tract. The urinary tract includes: The kidneys. The ureters. The bladder. The urethra. These organs make, store, and get rid of pee (urine) in the body. What are the causes? This infection is caused by germs (bacteria) in your genital area. These germs grow and cause swelling (inflammation) of your urinary tract. What increases the risk? The following factors may make you more likely to develop this condition: Using a small, thin tube (catheter) to drain pee. Not being able to control when you pee or poop (incontinence). Being female. If you are female, these things can increase the risk: Using these methods to prevent pregnancy: A medicine that kills sperm (spermicide). A device that blocks sperm (diaphragm). Having low levels of a female hormone (estrogen). Being pregnant. You are more likely to develop this condition if: You have genes that add to your risk. You are sexually active. You take antibiotic medicines. You have trouble peeing because of: A prostate that is bigger than normal, if you are female. A blockage in the part of your body that drains pee from the bladder. A kidney stone. A nerve condition that affects your bladder. Not getting enough to drink. Not peeing often enough. You have other conditions, such as: Diabetes. A weak disease-fighting system (immune system). Sickle cell disease. Gout. Injury of the spine. What are the signs or symptoms? Symptoms of this condition include: Needing to pee right away. Peeing small amounts often. Pain or burning when peeing. Blood in the pee. Pee that smells bad or not like normal. Trouble peeing. Pee that is cloudy. Fluid coming from the vagina, if you are female. Pain in the belly or lower back. Other symptoms include: Vomiting. Not feeling hungry. Feeling mixed up (confused). This may be the first symptom in  older adults. Being tired and grouchy (irritable). A fever. Watery poop (diarrhea). How is this treated? Taking antibiotic medicine. Taking other medicines. Drinking enough water. In some cases, you may need to see a specialist. Follow these instructions at home:  Medicines Take over-the-counter and prescription medicines only as told by your doctor. If you were prescribed an antibiotic medicine, take it as told by your doctor. Do not stop taking it even if you start to feel better. General instructions Make sure you: Pee until your bladder is empty. Do not hold pee for a long time. Empty your bladder after sex. Wipe from front to back after peeing or pooping if you are a female. Use each tissue one time when you wipe. Drink enough fluid to keep your pee pale yellow. Keep all follow-up visits. Contact a doctor if: You do not get better after 1-2 days. Your symptoms go away and then come back. Get help right away if: You have very bad back pain. You have very bad pain in your lower belly. You have a fever. You have chills. You feeling like you will vomit or you vomit. Summary A urinary tract infection (UTI) is an infection of any part of the urinary tract. This condition is caused by germs in your genital area. There are many risk factors for a UTI. Treatment includes antibiotic medicines. Drink enough fluid to keep your pee pale yellow. This information is not intended to replace advice given to you by your health care provider. Make sure you discuss any questions you have with your health care provider. Document Revised: 01/06/2020 Document Reviewed: 01/06/2020 Elsevier Patient Education    2023 [employer]  

## 2021-12-12 DIAGNOSIS — B359 Dermatophytosis, unspecified: Secondary | ICD-10-CM | POA: Diagnosis not present

## 2021-12-12 DIAGNOSIS — L298 Other pruritus: Secondary | ICD-10-CM | POA: Diagnosis not present

## 2021-12-14 LAB — URINE CULTURE

## 2021-12-19 ENCOUNTER — Telehealth: Payer: Self-pay

## 2021-12-19 NOTE — Telephone Encounter (Signed)
Receive voice mail from patient ask for urine culture results

## 2021-12-20 ENCOUNTER — Encounter: Payer: Medicare PPO | Admitting: Registered Nurse

## 2021-12-20 NOTE — Telephone Encounter (Signed)
Pt is still having symptoms. Wants to be prescribed maintenance medication to help as discussed at last visit.  Please advise and call back pt at [phone]  Thanks, Helene Kelp

## 2021-12-20 NOTE — Telephone Encounter (Signed)
Spoke to [place], a Nurse with [name] and informed her of pts results.  Sharee Pimple wanted to re-culture a clean catch urine from catheter, nurse  advised patient did not currently have a catheter but insured they would get a clean catch.  They are aware to use an I/O cath if unable to obtain clean catch specimen.  She voiced understanding and states she will inform the patient.

## 2021-12-23 DIAGNOSIS — F339 Major depressive disorder, recurrent, unspecified: Secondary | ICD-10-CM | POA: Diagnosis not present

## 2021-12-23 DIAGNOSIS — I10 Essential (primary) hypertension: Secondary | ICD-10-CM | POA: Diagnosis not present

## 2021-12-23 DIAGNOSIS — H3530 Unspecified macular degeneration: Secondary | ICD-10-CM | POA: Diagnosis not present

## 2021-12-24 ENCOUNTER — Encounter: Payer: Medicare PPO | Attending: Physical Medicine & Rehabilitation | Admitting: Registered Nurse

## 2021-12-24 VITALS — BP 117/73 | HR 60

## 2021-12-24 DIAGNOSIS — G894 Chronic pain syndrome: Secondary | ICD-10-CM | POA: Diagnosis not present

## 2021-12-24 DIAGNOSIS — M797 Fibromyalgia: Secondary | ICD-10-CM | POA: Diagnosis not present

## 2021-12-24 DIAGNOSIS — M2550 Pain in unspecified joint: Secondary | ICD-10-CM | POA: Insufficient documentation

## 2021-12-24 DIAGNOSIS — M48061 Spinal stenosis, lumbar region without neurogenic claudication: Secondary | ICD-10-CM | POA: Diagnosis not present

## 2021-12-24 MED ORDER — TRAMADOL HCL 50 MG PO TABS: 50.0000 mg | ORAL_TABLET | Freq: Three times a day (TID) | ORAL | 2 refills | Status: DC | PRN

## 2021-12-24 MED ORDER — LIDOCAINE 5 % EX PTCH: 1.0000 | MEDICATED_PATCH | Freq: Two times a day (BID) | CUTANEOUS | 4 refills | Status: DC

## 2021-12-24 MED ORDER — GABAPENTIN 300 MG PO CAPS: 300.0000 mg | ORAL_CAPSULE | Freq: Three times a day (TID) | ORAL | 3 refills | Status: DC

## 2021-12-24 NOTE — Progress Notes (Signed)
Subjective:    Patient ID: Robin Blackburn, female    DOB: 04/22/1946, 76 y.o.   MRN: [id number]  HPI: Robin Blackburn is a 76 y.o. female who returns for follow up appointment for chronic pain and medication refill. She states her pain is located in her neck, lower back pain, left lower extremity and generalized joint pain. She also reports she has fibro pain. She rates her pain 8. Her current exercise regime is receiving physical therapy.  [name] reports adverse effects from Lyrica, Lyrica discontinue and Gabapentin resumed, she verbalizes understanding.     Pain Inventory Average Pain 8 Pain Right Now 8 My pain is sharp, burning, stabbing, tingling, and aching  In the last 24 hours, has pain interfered with the following? General activity 5 Relation with others 2 Enjoyment of life 2 What TIME of day is your pain at its worst? morning , daytime, evening, and night Sleep (in general) Poor  Pain is worse with: bending and sitting Pain improves with: rest and medication Relief from Meds: 2  Family History  Problem Relation Age of Onset   Hypertension Father    Transient ischemic attack Father    Hypertension Brother    CVA Maternal Grandfather    Leukemia Brother    Social History   Socioeconomic History   Marital status: Divorced    Spouse name: Not on file   Number of children: Not on file   Years of education: Not on file   Highest education level: Not on file  Occupational History   Not on file  Tobacco Use   Smoking status: Never   Smokeless tobacco: Never  Vaping Use   Vaping Use: Never used  Substance and Sexual Activity   Alcohol use: No   Drug use: No   Sexual activity: Not on file  Other Topics Concern   Not on file  Social History Narrative   Not on file   Social Determinants of Health   Financial Resource Strain: Not on file  Food Insecurity: Not on file  Transportation Needs: Not on file  Physical Activity: Not on file  Stress: Not on file   Social Connections: Not on file   Past Surgical History:  Procedure Laterality Date   ABDOMINAL HYSTERECTOMY     ANKLE RECONSTRUCTION     APPENDECTOMY     BACK SURGERY     CHOLECYSTECTOMY     KNEE SURGERY     Past Surgical History:  Procedure Laterality Date   ABDOMINAL HYSTERECTOMY     ANKLE RECONSTRUCTION     APPENDECTOMY     BACK SURGERY     CHOLECYSTECTOMY     KNEE SURGERY     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   Depression    Fibromyalgia    Hypertension    Neuropathy    Rheumatoid arthritis ([place])    Stroke ([place]) 02/17/14   BP 117/73   Pulse 60   SpO2 97%   Opioid Risk Score:   Fall Risk Score:  `1  Depression screen [facility] 2/9     11/22/2021    1:26 PM  Depression screen PHQ 2/9  Decreased Interest 0  Down, Depressed, Hopeless 1  PHQ - 2 Score 1  Altered sleeping 0  Tired, decreased energy 0  Change in appetite 0  Feeling bad or failure about yourself  0  Trouble concentrating 0  Moving slowly or fidgety/restless 0  PHQ-9 Score 1     Review of Systems  Musculoskeletal:  Positive for back pain and neck pain.       Bilateral hand pain  All other systems reviewed and are negative.     Objective:   Physical Exam Vitals and nursing note reviewed.  Constitutional:      Appearance: Normal appearance.  Neck:     Comments: Cervical Paraspinal Tenderness: C-4-C-5 Cardiovascular:     Rate and Rhythm: Normal rate and regular rhythm.     Pulses: Normal pulses.     Heart sounds: Normal heart sounds.  Pulmonary:     Effort: Pulmonary effort is normal.     Breath sounds: Normal breath sounds.  Musculoskeletal:     Cervical back: Normal range of motion and neck supple.     Comments: Normal Muscle Bulk and Muscle Testing Reveals:  Upper Extremities: Full ROM and Muscle Strength 5/5 Thoracic and Lumbar Hypersensitivity Lower Extremities: Right: Full ROM and  Muscle Strength 4/5 Left Lower Extremity: Decreased ROM and Muscle Strength 4/5 Left Lower Extremity Flexion Produces Pain into her Lumbar and Bilateral Lower Extremities      Skin:    General: Skin is warm and dry.  Neurological:     Mental Status: She is alert and oriented to person, place, and time.  Psychiatric:        Mood and Affect: Mood normal.        Behavior: Behavior normal.         Assessment & Plan:  Lumbar Spinal Stenosis: Continue HEP as Tolerated. Continue to Monitor.  Fibromyalgia: Lyrica Discontinue. Resumed Gabapentin 300 mg one capsule every 8 hours. Continue HEP as tolerated and Physical Therapy.  Chronic Pain Syndrome: RX: Lidocaine Patches and Tramadol 50 mg one tablet every 8 hours as needed fpr pain #90.  We will continue the opioid monitoring program, this consists of regular clinic visits, examinations, urine drug screen, pill counts as well as use of [place] Controlled Substance Reporting system. A 12 month History has been reviewed on the [place] Controlled Substance Reporting System on 12/24/2021.  4. Polyarthralgia: Continue HEP as tolerated. Continue to Monitor.   F/U in 3 Months

## 2021-12-25 ENCOUNTER — Telehealth: Payer: Self-pay | Admitting: Registered Nurse

## 2021-12-25 DIAGNOSIS — N390 Urinary tract infection, site not specified: Secondary | ICD-10-CM | POA: Diagnosis not present

## 2021-12-25 DIAGNOSIS — F411 Generalized anxiety disorder: Secondary | ICD-10-CM | POA: Diagnosis not present

## 2021-12-25 NOTE — Telephone Encounter (Signed)
Received a call from Ms. Robin Blackburn stating she was given Lyrica and not the gabapentin. Yesterday this provider spoke with her Nurse Robin Blackburn and the pharmacist.  Pregablin was discontinued due to side effecrs and gabapentin was resumed. Tramadol and Lidocaine patches w ordered.  Placed a call to [facility] regarding the above.  Ms. Robin Blackburn nurse not available ask the receptionist to call the supervisor or DON so the above matter can be attended to.  Spoke with Robin Blackburn the DON, at [facility], she will look into the above, and return the call.  Placed a call to Ms. Robin Blackburn, no answer.  Placed a call to [place] to verify if the three prescriptions was sent  to [facility]. The pharmacist states the Lidocaine, Gabapentin and Tramadol was sent to [facility] on 12/24/2021.  The Pharmacist looking into the above as well.  Pharmacist states [facility] LPN signed for the medications. This information was given to [facility] the DON.

## 2021-12-26 ENCOUNTER — Telehealth: Payer: Self-pay | Admitting: Registered Nurse

## 2021-12-26 NOTE — Telephone Encounter (Signed)
Placed a call to Robin Blackburn,  She was updated on her medication: Lyrica was discontinued, Gabapentin resumed , Tramadol and Lidocaine patches was ordered.  She verbalizes understanding.  She also states she received her gabapentin and Tramadol last night.

## 2021-12-30 ENCOUNTER — Encounter: Payer: Self-pay | Admitting: Registered Nurse

## 2021-12-31 ENCOUNTER — Telehealth: Payer: Self-pay

## 2021-12-31 ENCOUNTER — Other Ambulatory Visit: Payer: Self-pay

## 2021-12-31 NOTE — Telephone Encounter (Signed)
[place] a nurse with [facility] called to f/u w/ UA results.  Returned call several times with no answer and no way to leave message with nurse.  Will try again at a later time.

## 2022-01-02 ENCOUNTER — Other Ambulatory Visit: Payer: Self-pay | Admitting: Physician Assistant

## 2022-01-02 MED ORDER — CEFDINIR 300 MG PO CAPS: 300.0000 mg | ORAL_CAPSULE | Freq: Two times a day (BID) | ORAL | 0 refills | Status: DC

## 2022-01-06 ENCOUNTER — Ambulatory Visit: Payer: Self-pay | Admitting: *Deleted

## 2022-01-06 ENCOUNTER — Encounter: Payer: Self-pay | Admitting: *Deleted

## 2022-01-06 NOTE — Patient Instructions (Signed)
Visit Information  Thank you for taking time to visit with me today. Please don't hesitate to contact me if I can be of assistance to you.   Please call the care guide team at [phone] if you need to cancel or reschedule your appointment.   If you are experiencing a Mental Health or Behavioral Health Crisis or need someone to talk to, please call the Suicide and Crisis Lifeline: 988 call the [place] National Suicide Prevention Lifeline: 1-800-273-8255 or TTY: 1-800-799-4 TTY (1-800-799-4889) to talk to a trained counselor call 1-800-273-TALK (toll free, 24 hour hotline) go to [facility] [address], [place] ([phone]) call the [place] Crisis Line: 800-939-9988 call 911  Patient verbalizes understanding of instructions and care plan provided today and agrees to view in MyChart. Active MyChart status and patient understanding of how to access instructions and care plan via MyChart confirmed with patient.     No further follow up required.  Brenen Beigel, BSW, MSW, LCSW  Licensed Clinical Social Worker  [facility]  Mailing Address-[address], [place], [place] [address] Physical Address-[address], , [place] [address] Toll Free Main # [phone] Fax # [phone] Cell # [phone] [email or web address]            

## 2022-01-06 NOTE — Patient Outreach (Cosign Needed Addendum)
  Care Coordination   Initial Visit Note   01/06/2022  Name: VELVET MOOMAW MRN: [id number] DOB: 1946-03-21  ALONZO LOVING is a 76 y.o. year old female who sees [employer], MD for primary care. I spoke with [name] by phone today.  What matters to the patients health and wellness today?  No Interventions Indicated.   Goals Addressed   None     SDOH assessments and interventions completed:   Yes SDOH Interventions Today    Flowsheet Row Most Recent Value  SDOH Interventions   Food Insecurity Interventions Intervention Not Indicated  Financial Strain Interventions Intervention Not Indicated  Housing Interventions Intervention Not Indicated  Physical Activity Interventions Patient Refused  Stress Interventions Intervention Not Indicated  Social Connections Interventions Patient Refused  Transportation Interventions Intervention Not Indicated       Care Coordination Interventions Activated:  Yes  Care Coordination Interventions:  Yes, provided.  Follow up plan: No further intervention required.  Encounter Outcome:  Pt. Visit Completed.  Nat Christen, BSW, MSW, LCSW  Licensed Education officer, [facility]  [address]ailing [place] [address], [place], [place] [address] Physical Address-[address] [place], [place] [address] Toll Free Main # [phone] Fax # [phone] Cell # [phone] [email or web address]

## 2022-01-07 ENCOUNTER — Other Ambulatory Visit: Payer: Self-pay

## 2022-01-08 DIAGNOSIS — S90821A Blister (nonthermal), right foot, initial encounter: Secondary | ICD-10-CM | POA: Diagnosis not present

## 2022-01-08 DIAGNOSIS — L89522 Pressure ulcer of left ankle, stage 2: Secondary | ICD-10-CM | POA: Diagnosis not present

## 2022-01-08 DIAGNOSIS — M6281 Muscle weakness (generalized): Secondary | ICD-10-CM | POA: Diagnosis not present

## 2022-01-08 DIAGNOSIS — R2689 Other abnormalities of gait and mobility: Secondary | ICD-10-CM | POA: Diagnosis not present

## 2022-01-09 ENCOUNTER — Telehealth: Payer: Self-pay

## 2022-01-09 NOTE — Telephone Encounter (Signed)
Please advised  

## 2022-01-09 NOTE — Telephone Encounter (Signed)
Nurse from [facility] called advising that they faxed over a urine culture for patient and she is currently on antibiotics. Patient is still having dysuria and lower abdominal pain. The nurse wanted to know if a medication could be called in to help with patients symptoms.    Thank you

## 2022-01-15 DIAGNOSIS — F411 Generalized anxiety disorder: Secondary | ICD-10-CM | POA: Diagnosis not present

## 2022-01-16 DIAGNOSIS — L905 Scar conditions and fibrosis of skin: Secondary | ICD-10-CM | POA: Diagnosis not present

## 2022-01-16 DIAGNOSIS — B355 Tinea imbricata: Secondary | ICD-10-CM | POA: Diagnosis not present

## 2022-01-20 ENCOUNTER — Encounter: Payer: Self-pay | Admitting: Urology

## 2022-01-20 ENCOUNTER — Ambulatory Visit (INDEPENDENT_AMBULATORY_CARE_PROVIDER_SITE_OTHER): Payer: Medicare PPO | Admitting: Urology

## 2022-01-20 VITALS — BP 160/86 | HR 64 | Ht 66.0 in | Wt 158.0 lb

## 2022-01-20 DIAGNOSIS — N3281 Overactive bladder: Secondary | ICD-10-CM | POA: Diagnosis not present

## 2022-01-20 DIAGNOSIS — N390 Urinary tract infection, site not specified: Secondary | ICD-10-CM

## 2022-01-20 DIAGNOSIS — Z87440 Personal history of urinary (tract) infections: Secondary | ICD-10-CM | POA: Diagnosis not present

## 2022-01-20 LAB — BLADDER SCAN AMB NON-IMAGING: Scan Result: 151

## 2022-01-20 MED ORDER — PHENAZOPYRIDINE HCL 100 MG PO TABS: 100.0000 mg | ORAL_TABLET | Freq: Two times a day (BID) | ORAL | 3 refills | Status: DC | PRN

## 2022-01-20 MED ORDER — CEFUROXIME AXETIL 250 MG PO TABS
250.0000 mg | ORAL_TABLET | Freq: Every day | ORAL | 11 refills | Status: DC
Start: 2022-01-20 — End: 2022-03-24

## 2022-01-20 NOTE — Progress Notes (Signed)
post void residual =151ml 

## 2022-01-20 NOTE — Progress Notes (Signed)
01/20/2022 2:05 PM   Robin Blackburn [birth year] [id number]  Referring provider: Redmond School, MD [address] [place],  [place] [address]  Pelvic pain   HPI: Ms Robin Blackburn is a 76yo here for followup for OAB and recurrent UTI. She has had pelvic pressure and pelvic pain fro the past 3 weeks. She took AZO which improved her pelvic pain. She has straining to urinate. She has suprapubic pain to palpation PVR 151cc. No pain with urination. She takes oxybutynin '10mg'$  BID for her OAB which resolved her urinary urgency and urge incontiennce No other complaints today    PMH: 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   Depression    Fibromyalgia    Hypertension    Neuropathy    Rheumatoid arthritis ([place])    Stroke ([place]) 02/17/14    Surgical History: Past Surgical History:  Procedure Laterality Date   ABDOMINAL HYSTERECTOMY     ANKLE RECONSTRUCTION     APPENDECTOMY     BACK SURGERY     CHOLECYSTECTOMY     KNEE SURGERY      Home Medications:  Allergies as of 01/20/2022       Reactions   Demerol [meperidine] Anaphylaxis   Hydromorphone Anaphylaxis   Other reaction(s): Unknown Other reaction(s): Unknown   Keflex [cephalexin] Nausea And Vomiting   Penicillins Anaphylaxis   Has patient had a PCN reaction causing immediate rash, facial/tongue/throat swelling, SOB or lightheadedness with hypotension: Yes Has patient had a PCN reaction causing severe rash involving mucus membranes or skin necrosis: No Has patient had a PCN reaction that required hospitalization No Has patient had a PCN reaction occurring within the last 10 years: No If all of the above answers are "NO", then may proceed with Cephalosporin use. Other reaction(s): Unknown, Unknown Other reaction(s): Unknown, Unknown   Sulfa Antibiotics Nausea And Vomiting   Other reaction(s): Unknown Other reaction(s): Unknown Other reaction(s):  Unknown   Acetaminophen    Other reaction(s): Unknown Other reaction(s): Unknown   Afluria Preservative Free [influenza Vac Split Quad]    Other reaction(s): Unknown   Bisphosphonates    GI intolerance Other reaction(s): Unknown, Unknown Other reaction(s): Unknown, Unknown   Codeine    Fentanyl Other (See Comments)   "felt like I had demons in my head" Other reaction(s): Unknown, Unknown Other reaction(s): Unknown, Unknown   Influenza Virus Vaccine Other (See Comments)   Other reaction(s): Unknown   Iodine    Other reaction(s): Unknown Other reaction(s): Unknown   Iohexol    Other reaction(s): Unknown, Unknown Other reaction(s): Unknown, Unknown   Levofloxacin    Other reaction(s): Unknown, Unknown Other reaction(s): Unknown, Unknown   Meperidine Hcl    Other reaction(s): Unknown, Unknown Other reaction(s): Unknown, Unknown   Misc. Sulfonamide Containing Compounds    Other reaction(s): Unknown   Morphine    Other reaction(s): Unknown, Unknown Other reaction(s): Unknown, Unknown   Oxycodone    Other reaction(s): Unknown Other reaction(s): Unknown   Oxycodone Hcl    Other reaction(s): Unknown Other reaction(s): Unknown   Oxytetracycline Nausea And Vomiting   Other reaction(s): Unknown Other reaction(s): Unknown   Penicillamine    Percocet [oxycodone-acetaminophen] Swelling   Mouth swelling.   Shellfish Allergy    Glucosamine not an option   Ciprofloxacin Nausea Only, Rash   Other reaction(s): Unknown, Unknown Other reaction(s): Unknown, Unknown   Levaquin [levofloxacin In D5w] Nausea And  Vomiting, Rash   Morphine And Related Rash        Medication List        Accurate as of January 20, 2022  2:05 PM. If you have any questions, ask your nurse or doctor.          acetaminophen 500 MG tablet Commonly known as: TYLENOL Take 500 mg by mouth every 6 (six) hours as needed for mild pain or moderate pain.   ascorbic acid 500 MG tablet Commonly known as:  VITAMIN C Take 500 mg by mouth daily.   benzocaine 10 % mucosal gel Commonly known as: ORAJEL Use as directed 1 application in the mouth or throat as needed for mouth pain.   busPIRone 15 MG tablet Commonly known as: BUSPAR Take 15 mg by mouth 2 (two) times daily.   cefdinir 300 MG capsule Commonly known as: OMNICEF Take 1 capsule (300 mg total) by mouth 2 (two) times daily.   chlorhexidine 0.12 % solution Commonly known as: PERIDEX Use as directed 15 mLs in the mouth or throat 2 (two) times daily.   Cholecalciferol 125 MCG (5000 UT) Tabs Take 1 tablet by mouth daily.   clobetasol cream 0.05 % Commonly known as: TEMOVATE Apply 1 application topically 2 (two) times daily.   clopidogrel 75 MG tablet Commonly known as: PLAVIX Take 75 mg by mouth at bedtime.   Cranberry 250 MG Tabs Take by mouth.   desloratadine 5 MG tablet Commonly known as: CLARINEX Take 5 mg by mouth every morning.   dicyclomine 20 MG tablet Commonly known as: BENTYL Take 20 mg by mouth every 6 (six) hours. Abdominal cramps   fluticasone 50 MCG/ACT nasal spray Commonly known as: FLONASE Place 1 spray into both nostrils daily.   gabapentin 300 MG capsule Commonly known as: NEURONTIN Take 1 capsule (300 mg total) by mouth 3 (three) times daily.   guaiFENesin 600 MG 12 hr tablet Commonly known as: MUCINEX Take 400 mg by mouth 2 (two) times daily.   hydrOXYzine 25 MG tablet Commonly known as: ATARAX Take 25 mg by mouth at bedtime.   leflunomide 10 MG tablet Commonly known as: ARAVA 1 tablet   lidocaine 5 % Commonly known as: LIDODERM Place 1 patch onto the skin every 12 (twelve) hours. Remove & Discard patch within 12 hours or as directed   loperamide 2 MG tablet Commonly known as: IMODIUM A-D Take 2 mg by mouth 4 (four) times daily as needed for diarrhea or loose stools.   Melatonin 2.5 MG Caps Take by mouth 2 (two) times daily.   methocarbamol 750 MG tablet Commonly known as:  ROBAXIN Take 1 tablet (750 mg total) by mouth every 6 (six) hours as needed for muscle spasms.   metoprolol tartrate 25 MG tablet Commonly known as: LOPRESSOR Take 1 tablet (25 mg total) by mouth 2 (two) times daily.   multivitamin tablet Take 1 tablet by mouth daily.   nystatin powder Commonly known as: MYCOSTATIN/NYSTOP Apply topically.   ondansetron 4 MG tablet Commonly known as: ZOFRAN Take 4 mg by mouth every 6 (six) hours as needed for nausea or vomiting.   oxybutynin 5 MG tablet Commonly known as: DITROPAN Take 1 tablet (5 mg total) by mouth 2 (two) times daily.   QUEtiapine 25 MG tablet Commonly known as: SEROQUEL Take 0.5 tablets (12.5 mg total) by mouth 2 (two) times daily.   Restasis 0.05 % ophthalmic emulsion Generic drug: cycloSPORINE Place 1 drop into both eyes 2 (two)  times daily.   saccharomyces boulardii 250 MG capsule Commonly known as: FLORASTOR Take 250 mg by mouth 2 (two) times daily.   simethicone 125 MG chewable tablet Commonly known as: MYLICON Chew 629 mg by mouth every 6 (six) hours as needed for flatulence.   sodium chloride 0.65 % Soln nasal spray Commonly known as: OCEAN Place 1 spray into both nostrils as needed for congestion. What changed: when to take this   ticagrelor 90 MG Tabs tablet Commonly known as: BRILINTA Take by mouth 2 (two) times daily.   traMADol 50 MG tablet Commonly known as: ULTRAM Take 1 tablet (50 mg total) by mouth every 8 (eight) hours as needed.   traZODone 50 MG tablet Commonly known as: DESYREL Take 50 mg by mouth at bedtime.   Xanax 0.25 MG tablet Generic drug: ALPRAZolam 1 tablet        Allergies:  Allergies  Allergen Reactions   Demerol [Meperidine] Anaphylaxis   Hydromorphone Anaphylaxis    Other reaction(s): Unknown Other reaction(s): Unknown   Keflex [Cephalexin] Nausea And Vomiting   Penicillins Anaphylaxis    Has patient had a PCN reaction causing immediate rash,  facial/tongue/throat swelling, SOB or lightheadedness with hypotension: Yes Has patient had a PCN reaction causing severe rash involving mucus membranes or skin necrosis: No Has patient had a PCN reaction that required hospitalization No Has patient had a PCN reaction occurring within the last 10 years: No If all of the above answers are "NO", then may proceed with Cephalosporin use.  Other reaction(s): Unknown, Unknown Other reaction(s): Unknown, Unknown   Sulfa Antibiotics Nausea And Vomiting    Other reaction(s): Unknown Other reaction(s): Unknown Other reaction(s): Unknown   Acetaminophen     Other reaction(s): Unknown Other reaction(s): Unknown   Afluria Preservative Free [Influenza Vac Split Quad]     Other reaction(s): Unknown   Bisphosphonates     GI intolerance Other reaction(s): Unknown, Unknown Other reaction(s): Unknown, Unknown   Codeine    Fentanyl Other (See Comments)    "felt like I had demons in my head" Other reaction(s): Unknown, Unknown Other reaction(s): Unknown, Unknown   Influenza Virus Vaccine Other (See Comments)    Other reaction(s): Unknown   Iodine     Other reaction(s): Unknown Other reaction(s): Unknown   Iohexol     Other reaction(s): Unknown, Unknown Other reaction(s): Unknown, Unknown   Levofloxacin     Other reaction(s): Unknown, Unknown Other reaction(s): Unknown, Unknown   Meperidine Hcl     Other reaction(s): Unknown, Unknown Other reaction(s): Unknown, Unknown   Misc. Sulfonamide Containing Compounds     Other reaction(s): Unknown   Morphine     Other reaction(s): Unknown, Unknown Other reaction(s): Unknown, Unknown   Oxycodone     Other reaction(s): Unknown Other reaction(s): Unknown   Oxycodone Hcl     Other reaction(s): Unknown Other reaction(s): Unknown   Oxytetracycline Nausea And Vomiting    Other reaction(s): Unknown Other reaction(s): Unknown   Penicillamine    Percocet [Oxycodone-Acetaminophen] Swelling    Mouth  swelling.   Shellfish Allergy     Glucosamine not an option   Ciprofloxacin Nausea Only and Rash    Other reaction(s): Unknown, Unknown Other reaction(s): Unknown, Unknown   Levaquin [Levofloxacin In D5w] Nausea And Vomiting and Rash   Morphine And Related Rash    Family History: Family History  Problem Relation Age of Onset   Hypertension Father    Transient ischemic attack Father    Hypertension Brother  CVA Maternal Grandfather    Leukemia Brother     Social History:  reports that she has never smoked. She has never been exposed to tobacco smoke. She has never used smokeless tobacco. She reports that she does not drink alcohol and does not use drugs.  ROS: All other review of systems were reviewed and are negative except what is noted above in HPI  Physical Exam: BP (!) 160/86   Pulse 64   Ht '5\' 6"'$  (1.676 m)   Wt 158 lb (71.7 kg)   BMI 25.50 kg/m   Constitutional:  Alert and oriented, No acute distress. HEENT:  AT, moist mucus membranes.  Trachea midline, no masses. Cardiovascular: No clubbing, cyanosis, or edema. Respiratory: Normal respiratory effort, no increased work of breathing. GI: Abdomen is soft, nontender, nondistended, no abdominal masses GU: No CVA tenderness.  Lymph: No cervical or inguinal lymphadenopathy. Skin: No rashes, bruises or suspicious lesions. Neurologic: Grossly intact, no focal deficits, moving all 4 extremities. Psychiatric: Normal mood and affect.  Laboratory Data: Lab Results  Component Value Date   WBC 11.3 (H) 08/23/2018   HGB 12.6 08/23/2018   HCT 41.5 08/23/2018   MCV 95.8 08/23/2018   PLT 422 (H) 08/23/2018    Lab Results  Component Value Date   CREATININE 0.78 08/23/2018    No results found for: "PSA"  No results found for: "TESTOSTERONE"  Lab Results  Component Value Date   HGBA1C 6.1 (H) 08/15/2015    Urinalysis    Component Value Date/Time   COLORURINE STRAW (A) 08/20/2018 1225   APPEARANCEUR Cloudy  (A) 12/11/2021 1442   LABSPEC 1.004 (L) 08/20/2018 1225   PHURINE 6.0 08/20/2018 1225   GLUCOSEU Negative 12/11/2021 1442   HGBUR MODERATE (A) 08/20/2018 1225   BILIRUBINUR Negative 12/11/2021 1442   KETONESUR NEGATIVE 08/20/2018 1225   PROTEINUR 2+ (A) 12/11/2021 1442   PROTEINUR NEGATIVE 08/20/2018 1225   UROBILINOGEN 0.2 04/10/2015 2212   NITRITE Positive (A) 12/11/2021 1442   NITRITE NEGATIVE 08/20/2018 1225   LEUKOCYTESUR 3+ (A) 12/11/2021 1442   LEUKOCYTESUR NEGATIVE 08/20/2018 1225    Lab Results  Component Value Date   LABMICR See below: 12/11/2021   WBCUA >30 (A) 12/11/2021   LABEPIT 0-10 12/11/2021   MUCUS Present 12/11/2021   BACTERIA Many (A) 12/11/2021    Pertinent Imaging:  No results found for this or any previous visit.  Results for orders placed during the hospital encounter of 02/19/14  US Venous Img Lower Bilateral  Narrative CLINICAL DATA:  Bilateral lower extremity pain and swelling  EXAM: BILATERAL LOWER EXTREMITY VENOUS DOPPLER ULTRASOUND  TECHNIQUE: Gray-scale sonography with graded compression, as well as color Doppler and duplex ultrasound were performed to evaluate the lower extremity deep venous systems from the level of the common femoral vein and including the common femoral, femoral, profunda femoral, popliteal and calf veins including the posterior tibial, peroneal and gastrocnemius veins when visible. The superficial great saphenous vein was also interrogated. Spectral Doppler was utilized to evaluate flow at rest and with distal augmentation maneuvers in the common femoral, femoral and popliteal veins.  COMPARISON:  Prior left lower extremity duplex venous ultrasound 02/24/2008  FINDINGS: RIGHT LOWER EXTREMITY  Common Femoral Vein: No evidence of thrombus. Normal compressibility, respiratory phasicity and response to augmentation.  Saphenofemoral Junction: No evidence of thrombus. Normal compressibility and flow on color  Doppler imaging.  Profunda Femoral Vein: No evidence of thrombus. Normal compressibility and flow on color Doppler imaging.  Femoral Vein: No  evidence of thrombus. Normal compressibility, respiratory phasicity and response to augmentation.  Popliteal Vein: No evidence of thrombus. Normal compressibility, respiratory phasicity and response to augmentation.  Calf Veins: No evidence of thrombus. Normal compressibility and flow on color Doppler imaging.  Superficial Great Saphenous Vein: No evidence of thrombus. Normal compressibility and flow on color Doppler imaging.  Venous Reflux:  None.  Other Findings:  None.  LEFT LOWER EXTREMITY  Common Femoral Vein: No evidence of thrombus. Normal compressibility, respiratory phasicity and response to augmentation.  Saphenofemoral Junction: No evidence of thrombus. Normal compressibility and flow on color Doppler imaging.  Profunda Femoral Vein: No evidence of thrombus. Normal compressibility and flow on color Doppler imaging.  Femoral Vein: No evidence of thrombus. Normal compressibility, respiratory phasicity and response to augmentation.  Popliteal Vein: No evidence of thrombus. Normal compressibility, respiratory phasicity and response to augmentation.  Calf Veins: No evidence of thrombus. Normal compressibility and flow on color Doppler imaging.  Superficial Great Saphenous Vein: No evidence of thrombus. Normal compressibility and flow on color Doppler imaging.  Venous Reflux:  None.  Other Findings:  None.  IMPRESSION: No evidence of deep venous thrombosis.   Electronically Signed By: Jacqulynn Cadet M.D. On: 02/21/2014 07:47  No results found for this or any previous visit.  No results found for this or any previous visit.  No results found for this or any previous visit.  No results found for this or any previous visit.  No results found for this or any previous visit.  No results found for this or any  previous visit.   Assessment & Plan:    1. OAB (overactive bladder) -Continue oxybutynin '10mg'$  BID - Urinalysis, Routine w reflex microscopic - BLADDER SCAN AMB NON-IMAGING  2. Recurrent UTI -ceftin '250mg'$  qhs -AZO PRN   No follow-ups on file.  Nicolette Bang, MD  [facility] [place]

## 2022-01-22 DIAGNOSIS — G478 Other sleep disorders: Secondary | ICD-10-CM | POA: Diagnosis not present

## 2022-01-22 DIAGNOSIS — F411 Generalized anxiety disorder: Secondary | ICD-10-CM | POA: Diagnosis not present

## 2022-01-22 DIAGNOSIS — F331 Major depressive disorder, recurrent, moderate: Secondary | ICD-10-CM | POA: Diagnosis not present

## 2022-01-23 DIAGNOSIS — F339 Major depressive disorder, recurrent, unspecified: Secondary | ICD-10-CM | POA: Diagnosis not present

## 2022-01-23 DIAGNOSIS — I10 Essential (primary) hypertension: Secondary | ICD-10-CM | POA: Diagnosis not present

## 2022-01-27 DIAGNOSIS — N3020 Other chronic cystitis without hematuria: Secondary | ICD-10-CM | POA: Diagnosis not present

## 2022-01-27 DIAGNOSIS — M797 Fibromyalgia: Secondary | ICD-10-CM | POA: Diagnosis not present

## 2022-01-27 DIAGNOSIS — I10 Essential (primary) hypertension: Secondary | ICD-10-CM | POA: Diagnosis not present

## 2022-01-30 DIAGNOSIS — F411 Generalized anxiety disorder: Secondary | ICD-10-CM | POA: Diagnosis not present

## 2022-01-31 DIAGNOSIS — N3090 Cystitis, unspecified without hematuria: Secondary | ICD-10-CM | POA: Diagnosis not present

## 2022-02-02 DIAGNOSIS — F411 Generalized anxiety disorder: Secondary | ICD-10-CM | POA: Diagnosis not present

## 2022-02-12 ENCOUNTER — Ambulatory Visit (INDEPENDENT_AMBULATORY_CARE_PROVIDER_SITE_OTHER): Payer: Medicare PPO | Admitting: Physician Assistant

## 2022-02-12 VITALS — BP 123/64 | HR 67

## 2022-02-12 DIAGNOSIS — R339 Retention of urine, unspecified: Secondary | ICD-10-CM

## 2022-02-12 DIAGNOSIS — R1032 Left lower quadrant pain: Secondary | ICD-10-CM | POA: Diagnosis not present

## 2022-02-12 DIAGNOSIS — R109 Unspecified abdominal pain: Secondary | ICD-10-CM | POA: Diagnosis not present

## 2022-02-12 DIAGNOSIS — Z87898 Personal history of other specified conditions: Secondary | ICD-10-CM

## 2022-02-12 DIAGNOSIS — N3281 Overactive bladder: Secondary | ICD-10-CM

## 2022-02-12 LAB — BLADDER SCAN AMB NON-IMAGING: Scan Result: 86

## 2022-02-12 NOTE — Progress Notes (Signed)
Assessment: 1. Flank pain  2. History of urinary retention - BLADDER SCAN AMB NON-IMAGING  3. Left lower quadrant abdominal pain - CT RENAL STONE STUDY  4. OAB (overactive bladder)    Plan: Ct stone study ordered. Continue oxybutynin daily and pyridium/AZO prn. Facility to collect urine for UA and cx if indicated. Will call facility with CT results and plan.  Chief Complaint: No chief complaint on file.   HPI: Robin Blackburn is a 76 y.o. female who presents for continued evaluation of OAB and pelvic pressure. She continues oxybutynin and states pyridium and AZO reduce her bladder pain, but she is no longer on them. Today, she c/o new c/o of left sided flank pain radiating to her groin x 2-3 weeks. No dysuria, burning, gross hematuria. Pt denies NV, fever. She has remote h/o stone passage. No recent imaging. CT in 2020 reveals small renal stone on the right with possible bladder stone. No evidence of retention today.  UA=pt unable to give specimen PVR=37m  01/20/22 Robin Blackburn a 89yohere for followup for OAB and recurrent UTI. She has had pelvic pressure and pelvic pain fro the past 3 weeks. She took AZO which improved her pelvic pain. She has straining to urinate. She has suprapubic pain to palpation PVR 151cc. No pain with urination. She takes oxybutynin '10mg'$  BID for her OAB which resolved her urinary urgency and urge incontiennce No other complaints today    Portions of the above documentation were copied from a prior visit for review purposes only.  Allergies: Allergies  Allergen Reactions   Demerol [Meperidine] Anaphylaxis   Hydromorphone Anaphylaxis    Other reaction(s): Unknown Other reaction(s): Unknown   Keflex [Cephalexin] Nausea And Vomiting   Penicillins Anaphylaxis    Has patient had a PCN reaction causing immediate rash, facial/tongue/throat swelling, SOB or lightheadedness with hypotension: Yes Has patient had a PCN reaction causing severe rash involving  mucus membranes or skin necrosis: No Has patient had a PCN reaction that required hospitalization No Has patient had a PCN reaction occurring within the last 10 years: No If all of the above answers are "NO", then may proceed with Cephalosporin use.  Other reaction(s): Unknown, Unknown Other reaction(s): Unknown, Unknown   Sulfa Antibiotics Nausea And Vomiting    Other reaction(s): Unknown Other reaction(s): Unknown Other reaction(s): Unknown   Acetaminophen     Other reaction(s): Unknown Other reaction(s): Unknown   Afluria Preservative Free [Influenza Vac Split Quad]     Other reaction(s): Unknown   Bisphosphonates     GI intolerance Other reaction(s): Unknown, Unknown Other reaction(s): Unknown, Unknown   Codeine    Fentanyl Other (See Comments)    "felt like I had demons in my head" Other reaction(s): Unknown, Unknown Other reaction(s): Unknown, Unknown   Influenza Virus Vaccine Other (See Comments)    Other reaction(s): Unknown   Iodine     Other reaction(s): Unknown Other reaction(s): Unknown   Iohexol     Other reaction(s): Unknown, Unknown Other reaction(s): Unknown, Unknown   Levofloxacin     Other reaction(s): Unknown, Unknown Other reaction(s): Unknown, Unknown   Meperidine Hcl     Other reaction(s): Unknown, Unknown Other reaction(s): Unknown, Unknown   Misc. Sulfonamide Containing Compounds     Other reaction(s): Unknown   Morphine     Other reaction(s): Unknown, Unknown Other reaction(s): Unknown, Unknown   Oxycodone     Other reaction(s): Unknown Other reaction(s): Unknown   Oxycodone Hcl     Other  reaction(s): Unknown Other reaction(s): Unknown   Oxytetracycline Nausea And Vomiting    Other reaction(s): Unknown Other reaction(s): Unknown   Penicillamine    Percocet [Oxycodone-Acetaminophen] Swelling    Mouth swelling.   Shellfish Allergy     Glucosamine not an option   Ciprofloxacin Nausea Only and Rash    Other reaction(s): Unknown,  Unknown Other reaction(s): Unknown, Unknown   Levaquin [Levofloxacin In D5w] Nausea And Vomiting and Rash   Morphine And Related Rash    PMH: 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   Depression    Fibromyalgia    Hypertension    Neuropathy    Rheumatoid arthritis ([place])    Stroke ([place]) 02/17/14    PSH: Past Surgical History:  Procedure Laterality Date   ABDOMINAL HYSTERECTOMY     ANKLE RECONSTRUCTION     APPENDECTOMY     BACK SURGERY     CHOLECYSTECTOMY     KNEE SURGERY      SH: Social History   Tobacco Use   Smoking status: Never    Passive exposure: Never   Smokeless tobacco: Never  Vaping Use   Vaping Use: Never used  Substance Use Topics   Alcohol use: No   Drug use: No    ROS: Noted above in HPI  PE: BP 123/64   Pulse 67  GENERAL APPEARANCE:  Chronically ill appearing, well developed, well nourished, NAD HEENT:  Atraumatic, normocephalic NECK:  Supple. Trachea midline ABDOMEN:  Soft, suprapubic tenderness, no masses EXTREMITIES:  LE weakness, in wheelchair NEUROLOGIC:  Alert and oriented x 3 MENTAL STATUS:  appropriate BACK:  Diffuse tenderness with left sided CVAT SKIN:  Warm, dry, and intact   Results: Laboratory Data: Lab Results  Component Value Date   WBC 11.3 (H) 08/23/2018   HGB 12.6 08/23/2018   HCT 41.5 08/23/2018   MCV 95.8 08/23/2018   PLT 422 (H) 08/23/2018    Lab Results  Component Value Date   CREATININE 0.78 08/23/2018    Lab Results  Component Value Date   HGBA1C 6.1 (H) 08/15/2015    Urinalysis    Component Value Date/Time   COLORURINE STRAW (A) 08/20/2018 1225   APPEARANCEUR Cloudy (A) 12/11/2021 1442   LABSPEC 1.004 (L) 08/20/2018 1225   PHURINE 6.0 08/20/2018 1225   GLUCOSEU Negative 12/11/2021 1442   HGBUR MODERATE (A) 08/20/2018 1225   BILIRUBINUR Negative 12/11/2021 1442   KETONESUR NEGATIVE 08/20/2018 1225    PROTEINUR 2+ (A) 12/11/2021 1442   PROTEINUR NEGATIVE 08/20/2018 1225   UROBILINOGEN 0.2 04/10/2015 2212   NITRITE Positive (A) 12/11/2021 1442   NITRITE NEGATIVE 08/20/2018 1225   LEUKOCYTESUR 3+ (A) 12/11/2021 1442   LEUKOCYTESUR NEGATIVE 08/20/2018 1225    Lab Results  Component Value Date   LABMICR See below: 12/11/2021   WBCUA >30 (A) 12/11/2021   LABEPIT 0-10 12/11/2021   MUCUS Present 12/11/2021   BACTERIA Many (A) 12/11/2021    Pertinent Imaging: No results found for this or any previous visit.  Results for orders placed during the hospital encounter of 02/19/14  US Venous Img Lower Bilateral  Narrative CLINICAL DATA:  Bilateral lower extremity pain and swelling  EXAM: BILATERAL LOWER EXTREMITY VENOUS DOPPLER ULTRASOUND  TECHNIQUE: Gray-scale sonography with graded compression, as well as color Doppler and duplex ultrasound were performed to evaluate the lower extremity deep venous systems from the level of the  common femoral vein and including the common femoral, femoral, profunda femoral, popliteal and calf veins including the posterior tibial, peroneal and gastrocnemius veins when visible. The superficial great saphenous vein was also interrogated. Spectral Doppler was utilized to evaluate flow at rest and with distal augmentation maneuvers in the common femoral, femoral and popliteal veins.  COMPARISON:  Prior left lower extremity duplex venous ultrasound 02/24/2008  FINDINGS: RIGHT LOWER EXTREMITY  Common Femoral Vein: No evidence of thrombus. Normal compressibility, respiratory phasicity and response to augmentation.  Saphenofemoral Junction: No evidence of thrombus. Normal compressibility and flow on color Doppler imaging.  Profunda Femoral Vein: No evidence of thrombus. Normal compressibility and flow on color Doppler imaging.  Femoral Vein: No evidence of thrombus. Normal compressibility, respiratory phasicity and response to  augmentation.  Popliteal Vein: No evidence of thrombus. Normal compressibility, respiratory phasicity and response to augmentation.  Calf Veins: No evidence of thrombus. Normal compressibility and flow on color Doppler imaging.  Superficial Great Saphenous Vein: No evidence of thrombus. Normal compressibility and flow on color Doppler imaging.  Venous Reflux:  None.  Other Findings:  None.  LEFT LOWER EXTREMITY  Common Femoral Vein: No evidence of thrombus. Normal compressibility, respiratory phasicity and response to augmentation.  Saphenofemoral Junction: No evidence of thrombus. Normal compressibility and flow on color Doppler imaging.  Profunda Femoral Vein: No evidence of thrombus. Normal compressibility and flow on color Doppler imaging.  Femoral Vein: No evidence of thrombus. Normal compressibility, respiratory phasicity and response to augmentation.  Popliteal Vein: No evidence of thrombus. Normal compressibility, respiratory phasicity and response to augmentation.  Calf Veins: No evidence of thrombus. Normal compressibility and flow on color Doppler imaging.  Superficial Great Saphenous Vein: No evidence of thrombus. Normal compressibility and flow on color Doppler imaging.  Venous Reflux:  None.  Other Findings:  None.  IMPRESSION: No evidence of deep venous thrombosis.   Electronically Signed By: Jacqulynn Cadet M.D. On: 02/21/2014 07:47  No results found for this or any previous visit.  No results found for this or any previous visit.  No results found for this or any previous visit.  No results found for this or any previous visit.  No results found for this or any previous visit.  No results found for this or any previous visit.  No results found for this or any previous visit (from the past 24 hour(s)).

## 2022-02-18 DIAGNOSIS — I10 Essential (primary) hypertension: Secondary | ICD-10-CM | POA: Diagnosis not present

## 2022-02-19 DIAGNOSIS — M25561 Pain in right knee: Secondary | ICD-10-CM | POA: Diagnosis not present

## 2022-02-19 DIAGNOSIS — G894 Chronic pain syndrome: Secondary | ICD-10-CM | POA: Diagnosis not present

## 2022-02-19 DIAGNOSIS — M1711 Unilateral primary osteoarthritis, right knee: Secondary | ICD-10-CM | POA: Diagnosis not present

## 2022-02-22 DIAGNOSIS — I10 Essential (primary) hypertension: Secondary | ICD-10-CM | POA: Diagnosis not present

## 2022-02-22 DIAGNOSIS — F331 Major depressive disorder, recurrent, moderate: Secondary | ICD-10-CM | POA: Diagnosis not present

## 2022-02-24 ENCOUNTER — Other Ambulatory Visit: Payer: Self-pay | Admitting: Physician Assistant

## 2022-02-24 ENCOUNTER — Telehealth: Payer: Self-pay

## 2022-02-24 MED ORDER — CEFDINIR 300 MG PO CAPS: 300.0000 mg | ORAL_CAPSULE | Freq: Two times a day (BID) | ORAL | 0 refills | Status: DC

## 2022-02-24 NOTE — Telephone Encounter (Signed)
[place] with [facility] called and stated that patient was upset, she is having painful urination and thinks she has kidney stones.  Facility faxed ua results on Friday.  Patient is requesting an apt, and nurse is asking if you can please review results.  Labs are scanned into chart, can you please advise on how to proceed.

## 2022-02-25 DIAGNOSIS — M1711 Unilateral primary osteoarthritis, right knee: Secondary | ICD-10-CM | POA: Diagnosis not present

## 2022-02-25 NOTE — Telephone Encounter (Signed)
Patient called and she is aware of PA recommendations also.  Omnicef was sent on 09/18 and I faxed Doxycycline rx to facility.  The patient wants to know if she needs the [facility] rx?  Please advise.

## 2022-02-25 NOTE — Telephone Encounter (Signed)
Called facility and they wanted to clarify how you wanted the culture collected, clean catch or I/O cath.  They stated last time the order said not to use an I/o cath due to frequent UTI's.  Can you please advise and I will reach back out to facility.  Doxycycline rx faxed to [phone] Attn: Claria Dice.

## 2022-02-25 NOTE — Telephone Encounter (Signed)
I have tried to reach facility to inform patient's nurse, unable to reach anyone on the phone or leave a voice mail.  I will continue to reach out to confirm secure fax and make nurse aware of Jill's recommendations.

## 2022-02-26 DIAGNOSIS — M79675 Pain in left toe(s): Secondary | ICD-10-CM | POA: Diagnosis not present

## 2022-02-26 DIAGNOSIS — F411 Generalized anxiety disorder: Secondary | ICD-10-CM | POA: Diagnosis not present

## 2022-02-26 DIAGNOSIS — B351 Tinea unguium: Secondary | ICD-10-CM | POA: Diagnosis not present

## 2022-02-26 DIAGNOSIS — M79674 Pain in right toe(s): Secondary | ICD-10-CM | POA: Diagnosis not present

## 2022-02-27 NOTE — Telephone Encounter (Signed)
Facility is aware I spoke to the Furniture conservator/restorer and she voiced understanding that a clean catch urine culture is needed.  Per Sharee Pimple to d/c the doxy and patient will begin [place].  Facility has the [employer] and they will fax culture results next week.

## 2022-02-28 DIAGNOSIS — N390 Urinary tract infection, site not specified: Secondary | ICD-10-CM | POA: Diagnosis not present

## 2022-02-28 DIAGNOSIS — R300 Dysuria: Secondary | ICD-10-CM | POA: Diagnosis not present

## 2022-03-03 DIAGNOSIS — N390 Urinary tract infection, site not specified: Secondary | ICD-10-CM | POA: Diagnosis not present

## 2022-03-05 DIAGNOSIS — M1712 Unilateral primary osteoarthritis, left knee: Secondary | ICD-10-CM | POA: Diagnosis not present

## 2022-03-05 DIAGNOSIS — F419 Anxiety disorder, unspecified: Secondary | ICD-10-CM | POA: Diagnosis not present

## 2022-03-05 DIAGNOSIS — M1711 Unilateral primary osteoarthritis, right knee: Secondary | ICD-10-CM | POA: Diagnosis not present

## 2022-03-07 DIAGNOSIS — G894 Chronic pain syndrome: Secondary | ICD-10-CM | POA: Diagnosis not present

## 2022-03-11 ENCOUNTER — Telehealth: Payer: Self-pay

## 2022-03-11 NOTE — Telephone Encounter (Signed)
Made patient's nurse Clarissa aware that if patient is still symptomatic from recent treatment per Sharee Pimple that the facility need to perform an in and out cath for urinalysis and microscopic exam with repeat of the urine culture. Nurse [place] voiced understanding.

## 2022-03-11 NOTE — Telephone Encounter (Signed)
-----   Message from Reynaldo Minium, [place] sent at 03/10/2022  5:34 PM EDT ----- Urine culture from patient's facility reviewed with recollection indicated if the patient is still symptomatic.  Please have the facility performing in and out cath for urinalysis and microscopic exam with repeat of the urine culture if the patient is still symptomatic from recent treatment. ----- Message ----- From: Jennette Bill Sent: 03/07/2022   1:36 PM EDT To: Reynaldo Minium, PA-C

## 2022-03-12 ENCOUNTER — Ambulatory Visit
Admission: RE | Admit: 2022-03-12 | Discharge: 2022-03-12 | Disposition: A | Discharge status: Home / Self Care | Payer: Medicare PPO | Source: Ambulatory Visit | Attending: Physician Assistant | Admitting: Physician Assistant

## 2022-03-12 ENCOUNTER — Ambulatory Visit: Payer: Medicare PPO | Admitting: Physician Assistant

## 2022-03-12 DIAGNOSIS — K5730 Diverticulosis of large intestine without perforation or abscess without bleeding: Secondary | ICD-10-CM | POA: Diagnosis not present

## 2022-03-17 ENCOUNTER — Telehealth: Payer: Self-pay

## 2022-03-17 NOTE — Telephone Encounter (Signed)
-----   Message from Reynaldo Minium, [place] sent at 03/17/2022  9:03 AM EDT ----- Please let facility know that pt has no stones on CT and Dr. Alyson Ingles will look at images to compare with previous cysto exam of the bladder. Pt needs PCP to review imaging and address the lung nodule noted in the study. No urinary findings that would cause pt to have pain. ----- Message ----- From: Interface, Rad Results In Sent: 03/14/2022  12:02 PM EDT To: Reynaldo Minium, PA-C

## 2022-03-17 NOTE — Telephone Encounter (Signed)
Spoke to [place], pts nurse and informed her of Jill's response to the CT.  She requested the information be faxed to the facility.  Letter created with PA's response and faxed to facility at [phone]

## 2022-03-18 DIAGNOSIS — R1084 Generalized abdominal pain: Secondary | ICD-10-CM | POA: Diagnosis not present

## 2022-03-18 DIAGNOSIS — R911 Solitary pulmonary nodule: Secondary | ICD-10-CM | POA: Diagnosis not present

## 2022-03-19 ENCOUNTER — Other Ambulatory Visit (HOSPITAL_COMMUNITY): Payer: Self-pay | Admitting: Family Medicine

## 2022-03-19 DIAGNOSIS — I10 Essential (primary) hypertension: Secondary | ICD-10-CM | POA: Diagnosis not present

## 2022-03-19 DIAGNOSIS — R911 Solitary pulmonary nodule: Secondary | ICD-10-CM

## 2022-03-19 DIAGNOSIS — F331 Major depressive disorder, recurrent, moderate: Secondary | ICD-10-CM | POA: Diagnosis not present

## 2022-03-20 DIAGNOSIS — Z9111 Patient's noncompliance with dietary regimen due to financial hardship: Secondary | ICD-10-CM | POA: Diagnosis not present

## 2022-03-20 DIAGNOSIS — Z7901 Long term (current) use of anticoagulants: Secondary | ICD-10-CM | POA: Diagnosis not present

## 2022-03-20 LAB — PROTIME-INR: INR: 0.97 (ref 0.80–1.20)

## 2022-03-24 ENCOUNTER — Ambulatory Visit (INDEPENDENT_AMBULATORY_CARE_PROVIDER_SITE_OTHER): Payer: Medicare PPO | Admitting: Physician Assistant

## 2022-03-24 VITALS — BP 143/82 | HR 58 | Wt 158.0 lb

## 2022-03-24 DIAGNOSIS — R300 Dysuria: Secondary | ICD-10-CM

## 2022-03-24 DIAGNOSIS — N952 Postmenopausal atrophic vaginitis: Secondary | ICD-10-CM | POA: Diagnosis not present

## 2022-03-24 DIAGNOSIS — N323 Diverticulum of bladder: Secondary | ICD-10-CM | POA: Diagnosis not present

## 2022-03-24 DIAGNOSIS — N342 Other urethritis: Secondary | ICD-10-CM

## 2022-03-24 MED ORDER — CEFDINIR 300 MG PO CAPS: 300.0000 mg | ORAL_CAPSULE | Freq: Two times a day (BID) | ORAL | 0 refills | Status: DC

## 2022-03-24 NOTE — Progress Notes (Signed)
post void residual =[phone]

## 2022-03-24 NOTE — Progress Notes (Signed)
Pt is prepped for and in and out catherization. Patient was cleaned and prepped in a sterle fashion with betadine. A 14 fr catheter foley was inserted. Urine return was note 140 ml.  Performed by Gibson Ramp RN  Urine sent [place] culture [phone]

## 2022-03-24 NOTE — Progress Notes (Signed)
Assessment: 1. Dysuria - In and Out Cath - BLADDER SCAN AMB NON-IMAGING  2. Atrophic vaginitis  3. Atrophic urethritis  4. Bladder diverticulum    Plan: MDX cx ordered. Will resume Omnicef until results available. 2 briefs each change to keep skin dry. Will discuss sxs exam findings with Dr. Alyson Ingles and schedule possible cysto/urethral dilation. Consider GYN eval for tx of adhesions/HRT recommendations.  Meds ordered this encounter  Medications   DISCONTD: cefdinir (OMNICEF) 300 MG capsule    Sig: Take 1 capsule (300 mg total) by mouth 2 (two) times daily.    Dispense:  14 capsule    Refill:  0     Chief Complaint: No chief complaint on file.   HPI: Robin Blackburn is a 76 y.o. female who presents for continued evaluation of recurrent UTIs, dysuria, incontinence, and lower abdominal pain. Pt is going through 2 briefs each night to stay dry as wearing 1 is not adequate. No fever, chills, gross hematuria.  PVR=143 prior to cath. Pt had no urge to void CT stone study did not show evidence of stone dz. Bladder diverticuli noted. 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 MR was negative.  02/12/22 Robin Blackburn is a 76 y.o. female who presents for continued evaluation of OAB and pelvic pressure. She continues oxybutynin and states pyridium and AZO reduce her bladder pain, but she is no longer on them. Today, she c/o new c/o of left sided flank pain radiating to her groin x 2-3 weeks. No dysuria, burning, gross hematuria. Pt denies NV, fever. She has remote h/o stone passage. No recent imaging. CT in 2020 reveals small renal stone on the right with possible bladder stone. No evidence of retention today.   UA=pt unable to give specimen PVR=33m   01/20/22 Robin Blackburn a 89yohere for followup for OAB and recurrent UTI. She has had pelvic pressure and pelvic pain fro the past 3 weeks. She took AZO which improved her pelvic pain. She  has straining to urinate. She has suprapubic pain to palpation PVR 151cc. No pain with urination. She takes oxybutynin '10mg'$  BID for her OAB which resolved her urinary urgency and urge incontiennce No other complaints today   Portions of the above documentation were copied from a prior visit for review purposes only.  Allergies: Allergies  Allergen Reactions   Demerol [Meperidine] Anaphylaxis   Hydromorphone Anaphylaxis    Other reaction(s): Unknown Other reaction(s): Unknown   Keflex [Cephalexin] Nausea And Vomiting   Penicillins Anaphylaxis    Has patient had a PCN reaction causing immediate rash, facial/tongue/throat swelling, SOB or lightheadedness with hypotension: Yes Has patient had a PCN reaction causing severe rash involving mucus membranes or skin necrosis: No Has patient had a PCN reaction that required hospitalization No Has patient had a PCN reaction occurring within the last 10 years: No If all of the above answers are "NO", then may proceed with Cephalosporin use.  Other reaction(s): Unknown, Unknown Other reaction(s): Unknown, Unknown   Sulfa Antibiotics Nausea And Vomiting    Other reaction(s): Unknown Other reaction(s): Unknown Other reaction(s): Unknown   Acetaminophen     Other reaction(s): Unknown Other reaction(s): Unknown   Afluria Preservative Free [Influenza Vac Split Quad]     Other reaction(s): Unknown   Bisphosphonates     GI intolerance Other reaction(s): Unknown, Unknown Other reaction(s): Unknown, Unknown   Codeine    Fentanyl Other (See Comments)    "  felt like I had demons in my head" Other reaction(s): Unknown, Unknown Other reaction(s): Unknown, Unknown   Influenza Virus Vaccine Other (See Comments)    Other reaction(s): Unknown   Iodine     Other reaction(s): Unknown Other reaction(s): Unknown   Iohexol     Other reaction(s): Unknown, Unknown Other reaction(s): Unknown, Unknown   Levofloxacin     Other reaction(s): Unknown,  Unknown Other reaction(s): Unknown, Unknown   Meperidine Hcl     Other reaction(s): Unknown, Unknown Other reaction(s): Unknown, Unknown   Misc. Sulfonamide Containing Compounds     Other reaction(s): Unknown   Morphine     Other reaction(s): Unknown, Unknown Other reaction(s): Unknown, Unknown   Oxycodone     Other reaction(s): Unknown Other reaction(s): Unknown   Oxycodone Hcl     Other reaction(s): Unknown Other reaction(s): Unknown   Oxytetracycline Nausea And Vomiting    Other reaction(s): Unknown Other reaction(s): Unknown   Penicillamine    Percocet [Oxycodone-Acetaminophen] Swelling    Mouth swelling.   Shellfish Allergy     Glucosamine not an option   Ciprofloxacin Nausea Only and Rash    Other reaction(s): Unknown, Unknown Other reaction(s): Unknown, Unknown   Levaquin [Levofloxacin In D5w] Nausea And Vomiting and Rash   Morphine And Related Rash    PMH: 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   Depression    Fibromyalgia    Hypertension    Neuropathy    Rheumatoid arthritis ([place])    Stroke ([place]) 02/17/14    PSH: Past Surgical History:  Procedure Laterality Date   ABDOMINAL HYSTERECTOMY     ANKLE RECONSTRUCTION     APPENDECTOMY     BACK SURGERY     CHOLECYSTECTOMY     KNEE SURGERY      SH: Social History   Tobacco Use   Smoking status: Never    Passive exposure: Never   Smokeless tobacco: Never  Vaping Use   Vaping Use: Never used  Substance Use Topics   Alcohol use: No   Drug use: No    ROS: All other review of systems were reviewed and are negative except what is noted above in HPI  PE: BP (!) 143/82   Pulse (!) 58   Wt 158 lb (71.7 kg)   BMI 25.50 kg/m  GENERAL APPEARANCE:  Well appearing, well developed, well nourished, NAD HEENT:  Atraumatic, normocephalic NECK:  Supple. Trachea midline ABDOMEN:  Soft, non-tender, no masses GU: Labial  adhesions noted with atrophic vagina and urethra. No lesions, rashes. No discharge. Cath specimen obtained during exam. Introitus does not allow half speculum due to adhesions. EXTREMITIES:  Moves all extremities well, without clubbing, cyanosis, or edema NEUROLOGIC:  Alert and oriented x 3, in wheelchair MENTAL STATUS:  appropriate BACK:  Non-tender to palpation, No CVAT SKIN:  Warm, dry, and intact   Results: Laboratory Data: Lab Results  Component Value Date   WBC 11.3 (H) 08/23/2018   HGB 12.6 08/23/2018   HCT 41.5 08/23/2018   MCV 95.8 08/23/2018   PLT 422 (H) 08/23/2018    Lab Results  Component Value Date   CREATININE 0.78 08/23/2018     Lab Results  Component Value Date   HGBA1C 6.1 (H) 08/15/2015    Urinalysis    Component Value Date/Time   COLORURINE STRAW (A) 08/20/2018 1225   APPEARANCEUR Cloudy (A) 12/11/2021 1442  LABSPEC 1.004 (L) 08/20/2018 1225   PHURINE 6.0 08/20/2018 1225   GLUCOSEU Negative 12/11/2021 1442   HGBUR MODERATE (A) 08/20/2018 1225   BILIRUBINUR Negative 12/11/2021 1442   KETONESUR NEGATIVE 08/20/2018 1225   PROTEINUR 2+ (A) 12/11/2021 1442   PROTEINUR NEGATIVE 08/20/2018 1225   UROBILINOGEN 0.2 04/10/2015 2212   NITRITE Positive (A) 12/11/2021 1442   NITRITE NEGATIVE 08/20/2018 1225   LEUKOCYTESUR 3+ (A) 12/11/2021 1442   LEUKOCYTESUR NEGATIVE 08/20/2018 1225    Lab Results  Component Value Date   LABMICR See below: 12/11/2021   WBCUA >30 (A) 12/11/2021   LABEPIT 0-10 12/11/2021   MUCUS Present 12/11/2021   BACTERIA Many (A) 12/11/2021    Pertinent Imaging: No results found for this or any previous visit.  Results for orders placed during the hospital encounter of 02/19/14  US Venous Img Lower Bilateral  Narrative CLINICAL DATA:  Bilateral lower extremity pain and swelling  EXAM: BILATERAL LOWER EXTREMITY VENOUS DOPPLER ULTRASOUND  TECHNIQUE: Gray-scale sonography with graded compression, as well as  color Doppler and duplex ultrasound were performed to evaluate the lower extremity deep venous systems from the level of the common femoral vein and including the common femoral, femoral, profunda femoral, popliteal and calf veins including the posterior tibial, peroneal and gastrocnemius veins when visible. The superficial great saphenous vein was also interrogated. Spectral Doppler was utilized to evaluate flow at rest and with distal augmentation maneuvers in the common femoral, femoral and popliteal veins.  COMPARISON:  Prior left lower extremity duplex venous ultrasound 02/24/2008  FINDINGS: RIGHT LOWER EXTREMITY  Common Femoral Vein: No evidence of thrombus. Normal compressibility, respiratory phasicity and response to augmentation.  Saphenofemoral Junction: No evidence of thrombus. Normal compressibility and flow on color Doppler imaging.  Profunda Femoral Vein: No evidence of thrombus. Normal compressibility and flow on color Doppler imaging.  Femoral Vein: No evidence of thrombus. Normal compressibility, respiratory phasicity and response to augmentation.  Popliteal Vein: No evidence of thrombus. Normal compressibility, respiratory phasicity and response to augmentation.  Calf Veins: No evidence of thrombus. Normal compressibility and flow on color Doppler imaging.  Superficial Great Saphenous Vein: No evidence of thrombus. Normal compressibility and flow on color Doppler imaging.  Venous Reflux:  None.  Other Findings:  None.  LEFT LOWER EXTREMITY  Common Femoral Vein: No evidence of thrombus. Normal compressibility, respiratory phasicity and response to augmentation.  Saphenofemoral Junction: No evidence of thrombus. Normal compressibility and flow on color Doppler imaging.  Profunda Femoral Vein: No evidence of thrombus. Normal compressibility and flow on color Doppler imaging.  Femoral Vein: No evidence of thrombus. Normal compressibility, respiratory  phasicity and response to augmentation.  Popliteal Vein: No evidence of thrombus. Normal compressibility, respiratory phasicity and response to augmentation.  Calf Veins: No evidence of thrombus. Normal compressibility and flow on color Doppler imaging.  Superficial Great Saphenous Vein: No evidence of thrombus. Normal compressibility and flow on color Doppler imaging.  Venous Reflux:  None.  Other Findings:  None.  IMPRESSION: No evidence of deep venous thrombosis.   Electronically Signed By: Jacqulynn Cadet M.D. On: 02/21/2014 07:47  No results found for this or any previous visit.  No results found for this or any previous visit.  No results found for this or any previous visit.  No valid procedures specified. No results found for this or any previous visit.  Results for orders placed in visit on 02/12/22  CT RENAL STONE STUDY  Narrative CLINICAL DATA:  Flank pain, kidney stone  suspected  EXAM: CT ABDOMEN AND PELVIS WITHOUT CONTRAST  TECHNIQUE: Multidetector CT imaging of the abdomen and pelvis was performed following the standard protocol without IV contrast.  RADIATION DOSE REDUCTION: This exam was performed according to the departmental dose-optimization program which includes automated exposure control, adjustment of the mA and/or kV according to patient size and/or use of iterative reconstruction technique.  COMPARISON:  August 20, 2018  FINDINGS: Evaluation is limited by lack of IV contrast.  Lower chest: LEFT irregular nodular appearing lower lobe consolidative opacity, incompletely assessed  Hepatobiliary: Unremarkable noncontrast appearance of the liver. Status post cholecystectomy. Mild prominence of the common bile duct, most consistent with post cholecystectomy state.  Pancreas: Fatty infiltration of the pancreas. No peripancreatic fat stranding is identified.  Spleen: Unremarkable.  Adrenals/Urinary Tract: Adrenal glands are  unremarkable. No hydronephrosis. No obstructing nephrolithiasis. Multiple bladder diverticuli. Mild bladder wall prominence. No nephrolithiasis are identified.  Stomach/Bowel: No evidence of bowel obstruction. Small rectal stool ball. Diverticulosis without evidence of acute diverticulitis. Moderate colonic stool burden throughout the colon.  Vascular/Lymphatic: Mild atherosclerotic calcifications of the nonaneurysmal abdominal aorta. No lymphadenopathy is identified.  Reproductive: Status post hysterectomy. No adnexal masses.  Other: No free air or free fluid.  Musculoskeletal: Degenerative changes of the lumbar spine  IMPRESSION: 1. There is a LEFT lower lobe consolidative opacity which is irregular and nodular in appearance. This could reflect infection but underlying space-occupying mass remains in the differential. Recommend improved evaluation with dedicated CT chest with contrast. 2. Multiple bladder diverticuli with mild bladder wall prominence. Recommend correlation with urinalysis to exclude cystitis. 3. No nephrolithiasis. 4. Moderate colonic stool burden. Small rectal stool ball.  These results will be called to the ordering clinician or representative by the Radiologist Assistant, and communication documented in the PACS or [employer].   Electronically Signed By: Valentino Saxon M.D. On: 03/14/2022 11:59  No results found for this or any previous visit (from the past 24 hour(s)).

## 2022-03-25 ENCOUNTER — Encounter: Payer: Self-pay | Admitting: Registered Nurse

## 2022-03-25 ENCOUNTER — Encounter: Payer: Medicare PPO | Attending: Physical Medicine & Rehabilitation | Admitting: Registered Nurse

## 2022-03-25 VITALS — BP 158/76 | HR 58 | Ht 66.0 in | Wt 158.0 lb

## 2022-03-25 DIAGNOSIS — M2550 Pain in unspecified joint: Secondary | ICD-10-CM | POA: Diagnosis not present

## 2022-03-25 DIAGNOSIS — M48061 Spinal stenosis, lumbar region without neurogenic claudication: Secondary | ICD-10-CM | POA: Insufficient documentation

## 2022-03-25 DIAGNOSIS — M797 Fibromyalgia: Secondary | ICD-10-CM | POA: Insufficient documentation

## 2022-03-25 DIAGNOSIS — Z79899 Other long term (current) drug therapy: Secondary | ICD-10-CM | POA: Diagnosis not present

## 2022-03-25 DIAGNOSIS — Z5181 Encounter for therapeutic drug level monitoring: Secondary | ICD-10-CM | POA: Diagnosis not present

## 2022-03-25 DIAGNOSIS — G894 Chronic pain syndrome: Secondary | ICD-10-CM | POA: Insufficient documentation

## 2022-03-25 MED ORDER — GABAPENTIN 300 MG PO CAPS: 300.0000 mg | ORAL_CAPSULE | Freq: Three times a day (TID) | ORAL | 3 refills | Status: DC

## 2022-03-25 MED ORDER — TRAMADOL HCL 50 MG PO TABS: 50.0000 mg | ORAL_TABLET | Freq: Three times a day (TID) | ORAL | 2 refills | Status: DC | PRN

## 2022-03-25 NOTE — Patient Instructions (Signed)
I need the results of her BPM or CMP  Please Fax Results to [phone]  Attention: Danella Sensing ANP- C

## 2022-03-25 NOTE — Progress Notes (Unsigned)
Subjective:    Patient ID: Robin Blackburn, female    DOB: 25-Jun-1945, 76 y.o.   MRN: [id number]  HPI: Robin Blackburn is a 76 y.o. female who returns for follow up appointment for chronic pain and medication refill. states *** pain is located in  ***. rates pain ***. current exercise regime is walking and performing stretching exercises.  Robin Blackburn Morphine equivalent is *** MME.      Pain Inventory Average Pain 10 Pain Right Now 8 My pain is sharp, burning, dull, stabbing, tingling, and aching  In the last 24 hours, has pain interfered with the following? General activity 5 Relation with others 0 Enjoyment of life 5 What TIME of day is your pain at its worst? varies Sleep (in general) NA  Pain is worse with: sitting Pain improves with: rest Relief from Meds: 2  Family History  Problem Relation Age of Onset   Hypertension Father    Transient ischemic attack Father    Hypertension Brother    CVA Maternal Grandfather    Leukemia Brother    Social History   Socioeconomic History   Marital status: Divorced    Spouse name: Not on file   Number of children: 0   Years of education: 12   Highest education level: 12th grade  Occupational History   Not on file  Tobacco Use   Smoking status: Never    Passive exposure: Never   Smokeless tobacco: Never  Vaping Use   Vaping Use: Never used  Substance and Sexual Activity   Alcohol use: No   Drug use: No   Sexual activity: Not Currently  Other Topics Concern   Not on file  Social History Narrative   Not on file   Social Determinants of Health   Financial Resource Strain: Low Risk  (01/06/2022)   Overall Financial Resource Strain (CARDIA)    Difficulty of Paying Living Expenses: Not hard at all  Food Insecurity: No Food Insecurity (01/06/2022)   Hunger Vital Sign    Worried About Running Out of Food in the Last Year: Never true    Ran Out of Food in the Last Year: Never true  Transportation Needs: No Transportation Needs  (01/06/2022)   PRAPARE - Hydrologist (Medical): No    Lack of Transportation (Non-Medical): No  Physical Activity: Inactive (01/06/2022)   Exercise Vital Sign    Days of Exercise per Week: 0 days    Minutes of Exercise per Session: 0 min  Stress: No Stress Concern Present (01/06/2022)   [place]    Feeling of Stress : Not at all  Social Connections: Moderately Isolated (01/06/2022)   Social Connection and Isolation Panel [NHANES]    Frequency of Communication with Friends and Family: More than three times a week    Frequency of Social Gatherings with Friends and Family: More than three times a week    Attends Religious Services: More than 4 times per year    Active Member of [employer] or Organizations: No    Attends Music therapist: Never    Marital Status: Divorced   Past Surgical History:  Procedure Laterality Date   ABDOMINAL HYSTERECTOMY     ANKLE RECONSTRUCTION     APPENDECTOMY     BACK SURGERY     CHOLECYSTECTOMY     KNEE SURGERY     Past Surgical History:  Procedure Laterality Date   ABDOMINAL  HYSTERECTOMY     ANKLE RECONSTRUCTION     APPENDECTOMY     BACK SURGERY     CHOLECYSTECTOMY     KNEE SURGERY     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   Depression    Fibromyalgia    Hypertension    Neuropathy    Rheumatoid arthritis ([place])    Stroke ([place]) 02/17/14   There were no vitals taken for this visit.  Opioid Risk Score:   Fall Risk Score:  `1  Depression screen [facility] 2/9     01/06/2022    9:33 AM 11/22/2021    1:26 PM  Depression screen PHQ 2/9  Decreased Interest 0 0  Down, Depressed, Hopeless 1 1  PHQ - 2 Score 1 1  Altered sleeping  0  Tired, decreased energy  0  Change in appetite  0  Feeling bad or failure about yourself   0  Trouble concentrating  0   Moving slowly or fidgety/restless  0  PHQ-9 Score  1      Review of Systems  Constitutional: Negative.   HENT: Negative.    Eyes: Negative.   Respiratory: Negative.    Cardiovascular: Negative.   Gastrointestinal: Negative.   Endocrine: Negative.   Genitourinary: Negative.   Musculoskeletal:  Positive for arthralgias.  Skin: Negative.   Allergic/Immunologic: Negative.   Neurological: Negative.   Hematological:  Bruises/bleeds easily.       [place]  Psychiatric/Behavioral:  Positive for dysphoric mood.        Pain related  All other systems reviewed and are negative.      Objective:   Physical Exam        Assessment & Plan:

## 2022-03-26 DIAGNOSIS — D649 Anemia, unspecified: Secondary | ICD-10-CM | POA: Diagnosis not present

## 2022-03-27 ENCOUNTER — Encounter: Payer: Self-pay | Admitting: Physician Assistant

## 2022-03-28 ENCOUNTER — Telehealth: Payer: Self-pay

## 2022-03-28 DIAGNOSIS — R300 Dysuria: Secondary | ICD-10-CM

## 2022-03-28 MED ORDER — FOSFOMYCIN TROMETHAMINE 3 G PO PACK: 3.0000 g | PACK | Freq: Once | ORAL | 1 refills | Status: AC

## 2022-03-28 NOTE — Telephone Encounter (Signed)
Made nurse Lerry Paterson for patient Robin Blackburn aware that patient culture was resistant to current prescription Omnicef and it should be discontinued. Made Nurse Lerry Paterson aware that I will sent prescription in for Fosfomycin 2 pack that will be faxed to faculty. Nurse Lerry Paterson voiced understanding

## 2022-03-28 NOTE — Telephone Encounter (Signed)
-----   Message from [facility], LPN sent at 23/55/7322 10:59 AM EDT ----- Regarding: MDX MDX Sharee Pimple ordered. Patient currently on [place] ----- Message ----- From: Jennette Bill Sent: 03/27/2022  10:13 AM EDT To: Iris Pert, LPN  Resolve Mdx Culture

## 2022-04-01 DIAGNOSIS — F419 Anxiety disorder, unspecified: Secondary | ICD-10-CM | POA: Diagnosis not present

## 2022-04-01 DIAGNOSIS — G8929 Other chronic pain: Secondary | ICD-10-CM | POA: Diagnosis not present

## 2022-04-01 DIAGNOSIS — I679 Cerebrovascular disease, unspecified: Secondary | ICD-10-CM | POA: Diagnosis not present

## 2022-04-01 DIAGNOSIS — G629 Polyneuropathy, unspecified: Secondary | ICD-10-CM | POA: Diagnosis not present

## 2022-04-01 DIAGNOSIS — K219 Gastro-esophageal reflux disease without esophagitis: Secondary | ICD-10-CM | POA: Diagnosis not present

## 2022-04-01 DIAGNOSIS — E559 Vitamin D deficiency, unspecified: Secondary | ICD-10-CM | POA: Diagnosis not present

## 2022-04-01 DIAGNOSIS — I10 Essential (primary) hypertension: Secondary | ICD-10-CM | POA: Diagnosis not present

## 2022-04-01 DIAGNOSIS — F418 Other specified anxiety disorders: Secondary | ICD-10-CM | POA: Diagnosis not present

## 2022-04-02 DIAGNOSIS — G478 Other sleep disorders: Secondary | ICD-10-CM | POA: Diagnosis not present

## 2022-04-02 DIAGNOSIS — F331 Major depressive disorder, recurrent, moderate: Secondary | ICD-10-CM | POA: Diagnosis not present

## 2022-04-02 DIAGNOSIS — F411 Generalized anxiety disorder: Secondary | ICD-10-CM | POA: Diagnosis not present

## 2022-04-08 DIAGNOSIS — Z8739 Personal history of other diseases of the musculoskeletal system and connective tissue: Secondary | ICD-10-CM | POA: Diagnosis not present

## 2022-04-08 DIAGNOSIS — M1990 Unspecified osteoarthritis, unspecified site: Secondary | ICD-10-CM | POA: Diagnosis not present

## 2022-04-08 DIAGNOSIS — M79671 Pain in right foot: Secondary | ICD-10-CM | POA: Diagnosis not present

## 2022-04-08 DIAGNOSIS — Z79899 Other long term (current) drug therapy: Secondary | ICD-10-CM | POA: Diagnosis not present

## 2022-04-08 DIAGNOSIS — M79643 Pain in unspecified hand: Secondary | ICD-10-CM | POA: Diagnosis not present

## 2022-04-08 DIAGNOSIS — M797 Fibromyalgia: Secondary | ICD-10-CM | POA: Diagnosis not present

## 2022-04-08 DIAGNOSIS — F411 Generalized anxiety disorder: Secondary | ICD-10-CM | POA: Diagnosis not present

## 2022-04-08 DIAGNOSIS — M130 Polyarthritis, unspecified: Secondary | ICD-10-CM | POA: Diagnosis not present

## 2022-04-09 DIAGNOSIS — R5381 Other malaise: Secondary | ICD-10-CM | POA: Diagnosis not present

## 2022-04-09 DIAGNOSIS — K219 Gastro-esophageal reflux disease without esophagitis: Secondary | ICD-10-CM | POA: Diagnosis not present

## 2022-04-09 DIAGNOSIS — G894 Chronic pain syndrome: Secondary | ICD-10-CM | POA: Diagnosis not present

## 2022-04-09 DIAGNOSIS — E559 Vitamin D deficiency, unspecified: Secondary | ICD-10-CM | POA: Diagnosis not present

## 2022-04-09 DIAGNOSIS — F419 Anxiety disorder, unspecified: Secondary | ICD-10-CM | POA: Diagnosis not present

## 2022-04-09 DIAGNOSIS — I639 Cerebral infarction, unspecified: Secondary | ICD-10-CM | POA: Diagnosis not present

## 2022-04-09 DIAGNOSIS — Z79899 Other long term (current) drug therapy: Secondary | ICD-10-CM | POA: Diagnosis not present

## 2022-04-09 DIAGNOSIS — I679 Cerebrovascular disease, unspecified: Secondary | ICD-10-CM | POA: Diagnosis not present

## 2022-04-10 MED ORDER — FOSFOMYCIN TROMETHAMINE 3 G PO PACK: 3.0000 g | PACK | Freq: Once | ORAL | 1 refills | Status: AC

## 2022-04-10 NOTE — Addendum Note (Signed)
Addended by: Darcella Gasman R on: 04/10/2022 03:58 PM   Modules accepted: Orders

## 2022-04-10 NOTE — Telephone Encounter (Signed)
Print and faxed Fosfomycin 3 g pack with 1 refill 04/10/2022  Made Lorriar at [facility] for Everlene Balls aware that I will be sending in a rx for Fosfomycin 3 pack with 1 refill. Made nurse Lorriar aware that Ms. Haydel should take one packet today and then skip a day and then take the next pack. Nurse [place] voiced understanding.

## 2022-04-10 NOTE — Telephone Encounter (Signed)
Robin Blackburn call in today about results from MDX culture. Made Robin Blackburn aware that I spoke to Nurse Lerry Paterson on 10/20 and made aware to have Robin Blackburn stop Omnicef and start Fosfomycin 2 pack. Robin Blackburn voiced that she never received the medication Fosfomycin and she has completed the course of [place]. Robin Blackburn voiced that she has to hold the bottom of her stomach before voiding. Robin Blackburn voiced that she having burning  and she hurts but once she start to void it goes away. Robin Blackburn voiced that her back hurts and wrap around to her left side over her kidneys. Robin Blackburn voiced that she would like to see if the Fosfomycin comes in a pill formula. Made Robin Blackburn aware that I will follow up with Robin Blackburn on a course of action and will be back in touch with her.  Patient voiced understanding

## 2022-04-11 DIAGNOSIS — M6281 Muscle weakness (generalized): Secondary | ICD-10-CM | POA: Diagnosis not present

## 2022-04-11 DIAGNOSIS — Z79899 Other long term (current) drug therapy: Secondary | ICD-10-CM | POA: Diagnosis not present

## 2022-04-11 DIAGNOSIS — F411 Generalized anxiety disorder: Secondary | ICD-10-CM | POA: Diagnosis not present

## 2022-04-11 DIAGNOSIS — M0589 Other rheumatoid arthritis with rheumatoid factor of multiple sites: Secondary | ICD-10-CM | POA: Diagnosis not present

## 2022-04-17 DIAGNOSIS — L308 Other specified dermatitis: Secondary | ICD-10-CM | POA: Diagnosis not present

## 2022-04-17 DIAGNOSIS — B353 Tinea pedis: Secondary | ICD-10-CM | POA: Diagnosis not present

## 2022-04-23 ENCOUNTER — Ambulatory Visit (INDEPENDENT_AMBULATORY_CARE_PROVIDER_SITE_OTHER): Payer: Medicare PPO | Admitting: Physician Assistant

## 2022-04-23 VITALS — BP 134/78 | HR 70

## 2022-04-23 DIAGNOSIS — R300 Dysuria: Secondary | ICD-10-CM

## 2022-04-23 DIAGNOSIS — N952 Postmenopausal atrophic vaginitis: Secondary | ICD-10-CM | POA: Diagnosis not present

## 2022-04-23 LAB — BLADDER SCAN AMB NON-IMAGING: Scan Result: 70

## 2022-04-23 NOTE — Progress Notes (Signed)
Assessment: 1. Dysuria - BLADDER SCAN AMB NON-IMAGING  2. Atrophic vaginitis - Ambulatory referral to Obstetrics / Gynecology    Plan: Orders written for facility to obtain a clean-catch urine and serum for urinalysis to rule out another UTI.  Continue Ditropan as previously prescribed for urgency and incontinence.  Referral to GYN to assess labial adhesions and potential HRT therapy.  Keep follow-up as scheduled in 3 months for UA and PVR.    Chief Complaint: No chief complaint on file.   HPI: Robin Blackburn is a 76 y.o. female who presents for continued evaluation of dysuria and recurrent UTIs.  Her most recent cultures were pan resistant and UTI was treated with fosfomycin x2.  The patient reports she had a very difficult time getting this medication at her current facility.  She is working on a new facility, but remains frustrated that she is not getting her medications when she feels they are due.  She would also like a referral to gynecology for her atrophic changes previously discussed.  No recent fever, chills, increase in chronic abdominal pain reported.  No gross hematuria since last visit.  Chest CT scheduled for this week in follow-up of recent lung nodule noted on CT stone study.  UA= the patient voided into a hat but missed the target and emptied into the toilet.   PVR= 70 mL   03/24/22 Robin Blackburn is a 76 y.o. female who presents for continued evaluation of recurrent UTIs, dysuria, incontinence, and lower abdominal pain. Pt is going through 2 briefs each night to stay dry as wearing 1 is not adequate. No fever, chills, gross hematuria.  PVR=143 prior to cath. Pt had no urge to void CT stone study did not show evidence of stone dz. Bladder diverticuli noted. 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 MR was negative.   02/12/22 Robin Blackburn is a 76 y.o. female who presents for continued evaluation of OAB and pelvic  pressure. She continues oxybutynin and states pyridium and AZO reduce her bladder pain, but she is no longer on them. Today, she c/o new c/o of left sided flank pain radiating to her groin x 2-3 weeks. No dysuria, burning, gross hematuria. Pt denies NV, fever. She has remote h/o stone passage. No recent imaging. CT in 2020 reveals small renal stone on the right with possible bladder stone. No evidence of retention today.   UA=pt unable to give specimen PVR=39m   01/20/22 Robin Blackburn a 89yohere for followup for OAB and recurrent UTI. She has had pelvic pressure and pelvic pain fro the past 3 weeks. She took AZO which improved her pelvic pain. She has straining to urinate. She has suprapubic pain to palpation PVR 151cc. No pain with urination. She takes oxybutynin '10mg'$  BID for her OAB which resolved her urinary urgency and urge incontiennce No other complaints today   Portions of the above documentation were copied from a prior visit for review purposes only.  Allergies: Allergies  Allergen Reactions   Demerol [Meperidine] Anaphylaxis   Hydromorphone Anaphylaxis    Other reaction(s): Unknown Other reaction(s): Unknown   Keflex [Cephalexin] Nausea And Vomiting   Penicillins Anaphylaxis    Has patient had a PCN reaction causing immediate rash, facial/tongue/throat swelling, SOB or lightheadedness with hypotension: Yes Has patient had a PCN reaction causing severe rash involving mucus membranes or skin necrosis: No Has patient had a PCN reaction that required hospitalization  No Has patient had a PCN reaction occurring within the last 10 years: No If all of the above answers are "NO", then may proceed with Cephalosporin use.  Other reaction(s): Unknown, Unknown Other reaction(s): Unknown, Unknown   Sulfa Antibiotics Nausea And Vomiting    Other reaction(s): Unknown Other reaction(s): Unknown Other reaction(s): Unknown   Acetaminophen     Other reaction(s): Unknown Other reaction(s):  Unknown   Afluria Preservative Free [Influenza Vac Split Quad]     Other reaction(s): Unknown   Bisphosphonates     GI intolerance Other reaction(s): Unknown, Unknown Other reaction(s): Unknown, Unknown   Codeine    Fentanyl Other (See Comments)    "felt like I had demons in my head" Other reaction(s): Unknown, Unknown Other reaction(s): Unknown, Unknown   Influenza Virus Vaccine Other (See Comments)    Other reaction(s): Unknown   Iodine     Other reaction(s): Unknown Other reaction(s): Unknown   Iohexol     Other reaction(s): Unknown, Unknown Other reaction(s): Unknown, Unknown   Levofloxacin     Other reaction(s): Unknown, Unknown Other reaction(s): Unknown, Unknown   Meperidine Hcl     Other reaction(s): Unknown, Unknown Other reaction(s): Unknown, Unknown   Misc. Sulfonamide Containing Compounds     Other reaction(s): Unknown   Morphine     Other reaction(s): Unknown, Unknown Other reaction(s): Unknown, Unknown   Oxycodone     Other reaction(s): Unknown Other reaction(s): Unknown   Oxycodone Hcl     Other reaction(s): Unknown Other reaction(s): Unknown   Oxytetracycline Nausea And Vomiting    Other reaction(s): Unknown Other reaction(s): Unknown   Penicillamine    Percocet [Oxycodone-Acetaminophen] Swelling    Mouth swelling.   Shellfish Allergy     Glucosamine not an option   Ciprofloxacin Nausea Only and Rash    Other reaction(s): Unknown, Unknown Other reaction(s): Unknown, Unknown   Levaquin [Levofloxacin In D5w] Nausea And Vomiting and Rash   Morphine And Related Rash    PMH: 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   Depression    Fibromyalgia    Hypertension    Neuropathy    Rheumatoid arthritis ([place])    Stroke ([place]) 02/17/14    PSH: Past Surgical History:  Procedure Laterality Date   ABDOMINAL HYSTERECTOMY     ANKLE RECONSTRUCTION     APPENDECTOMY      BACK SURGERY     CHOLECYSTECTOMY     KNEE SURGERY      SH: Social History   Tobacco Use   Smoking status: Never    Passive exposure: Never   Smokeless tobacco: Never  Vaping Use   Vaping Use: Never used  Substance Use Topics   Alcohol use: No   Drug use: No    ROS: All other review of systems were reviewed and are negative except what is noted above in HPI  PE: There were no vitals taken for this visit. GENERAL APPEARANCE:  Well appearing, well developed, well nourished, NAD HEENT:  Atraumatic, normocephalic NECK:  Supple. Trachea midline ABDOMEN:  Soft, non-tender, no masses EXTREMITIES:  Moves all extremities well, without clubbing, cyanosis, or edema NEUROLOGIC:  Alert and oriented x 3, normal gait, CN II-XII grossly intact MENTAL STATUS:  appropriate BACK:  Non-tender to palpation, No CVAT SKIN:  Warm, dry, and intact   Results: Laboratory Data: Lab Results  Component Value Date   WBC 11.3 (  H) 08/23/2018   HGB 12.6 08/23/2018   HCT 41.5 08/23/2018   MCV 95.8 08/23/2018   PLT 422 (H) 08/23/2018    Lab Results  Component Value Date   CREATININE 0.78 08/23/2018    No results found for: "PSA"  No results found for: "TESTOSTERONE"  Lab Results  Component Value Date   HGBA1C 6.1 (H) 08/15/2015    Urinalysis    Component Value Date/Time   COLORURINE STRAW (A) 08/20/2018 1225   APPEARANCEUR Cloudy (A) 12/11/2021 1442   LABSPEC 1.004 (L) 08/20/2018 1225   PHURINE 6.0 08/20/2018 1225   GLUCOSEU Negative 12/11/2021 1442   HGBUR MODERATE (A) 08/20/2018 1225   BILIRUBINUR Negative 12/11/2021 1442   KETONESUR NEGATIVE 08/20/2018 1225   PROTEINUR 2+ (A) 12/11/2021 1442   PROTEINUR NEGATIVE 08/20/2018 1225   UROBILINOGEN 0.2 04/10/2015 2212   NITRITE Positive (A) 12/11/2021 1442   NITRITE NEGATIVE 08/20/2018 1225   LEUKOCYTESUR 3+ (A) 12/11/2021 1442   LEUKOCYTESUR NEGATIVE 08/20/2018 1225    Lab Results  Component Value Date   LABMICR See below:  12/11/2021   WBCUA >30 (A) 12/11/2021   LABEPIT 0-10 12/11/2021   MUCUS Present 12/11/2021   BACTERIA Many (A) 12/11/2021    Pertinent Imaging: No results found for this or any previous visit.  Results for orders placed during the hospital encounter of 02/19/14  US Venous Img Lower Bilateral  Narrative CLINICAL DATA:  Bilateral lower extremity pain and swelling  EXAM: BILATERAL LOWER EXTREMITY VENOUS DOPPLER ULTRASOUND  TECHNIQUE: Gray-scale sonography with graded compression, as well as color Doppler and duplex ultrasound were performed to evaluate the lower extremity deep venous systems from the level of the common femoral vein and including the common femoral, femoral, profunda femoral, popliteal and calf veins including the posterior tibial, peroneal and gastrocnemius veins when visible. The superficial great saphenous vein was also interrogated. Spectral Doppler was utilized to evaluate flow at rest and with distal augmentation maneuvers in the common femoral, femoral and popliteal veins.  COMPARISON:  Prior left lower extremity duplex venous ultrasound 02/24/2008  FINDINGS: RIGHT LOWER EXTREMITY  Common Femoral Vein: No evidence of thrombus. Normal compressibility, respiratory phasicity and response to augmentation.  Saphenofemoral Junction: No evidence of thrombus. Normal compressibility and flow on color Doppler imaging.  Profunda Femoral Vein: No evidence of thrombus. Normal compressibility and flow on color Doppler imaging.  Femoral Vein: No evidence of thrombus. Normal compressibility, respiratory phasicity and response to augmentation.  Popliteal Vein: No evidence of thrombus. Normal compressibility, respiratory phasicity and response to augmentation.  Calf Veins: No evidence of thrombus. Normal compressibility and flow on color Doppler imaging.  Superficial Great Saphenous Vein: No evidence of thrombus. Normal compressibility and flow on color  Doppler imaging.  Venous Reflux:  None.  Other Findings:  None.  LEFT LOWER EXTREMITY  Common Femoral Vein: No evidence of thrombus. Normal compressibility, respiratory phasicity and response to augmentation.  Saphenofemoral Junction: No evidence of thrombus. Normal compressibility and flow on color Doppler imaging.  Profunda Femoral Vein: No evidence of thrombus. Normal compressibility and flow on color Doppler imaging.  Femoral Vein: No evidence of thrombus. Normal compressibility, respiratory phasicity and response to augmentation.  Popliteal Vein: No evidence of thrombus. Normal compressibility, respiratory phasicity and response to augmentation.  Calf Veins: No evidence of thrombus. Normal compressibility and flow on color Doppler imaging.  Superficial Great Saphenous Vein: No evidence of thrombus. Normal compressibility and flow on color Doppler imaging.  Venous Reflux:  None.  Other  Findings:  None.  IMPRESSION: No evidence of deep venous thrombosis.   Electronically Signed By: Jacqulynn Cadet M.D. On: 02/21/2014 07:47  No results found for this or any previous visit.  No results found for this or any previous visit.  No results found for this or any previous visit.  No valid procedures specified. No results found for this or any previous visit.  Results for orders placed in visit on 02/12/22  CT RENAL STONE STUDY  Narrative CLINICAL DATA:  Flank pain, kidney stone suspected  EXAM: CT ABDOMEN AND PELVIS WITHOUT CONTRAST  TECHNIQUE: Multidetector CT imaging of the abdomen and pelvis was performed following the standard protocol without IV contrast.  RADIATION DOSE REDUCTION: This exam was performed according to the departmental dose-optimization program which includes automated exposure control, adjustment of the mA and/or kV according to patient size and/or use of iterative reconstruction technique.  COMPARISON:  August 20, 2018  FINDINGS: Evaluation is limited by lack of IV contrast.  Lower chest: LEFT irregular nodular appearing lower lobe consolidative opacity, incompletely assessed  Hepatobiliary: Unremarkable noncontrast appearance of the liver. Status post cholecystectomy. Mild prominence of the common bile duct, most consistent with post cholecystectomy state.  Pancreas: Fatty infiltration of the pancreas. No peripancreatic fat stranding is identified.  Spleen: Unremarkable.  Adrenals/Urinary Tract: Adrenal glands are unremarkable. No hydronephrosis. No obstructing nephrolithiasis. Multiple bladder diverticuli. Mild bladder wall prominence. No nephrolithiasis are identified.  Stomach/Bowel: No evidence of bowel obstruction. Small rectal stool ball. Diverticulosis without evidence of acute diverticulitis. Moderate colonic stool burden throughout the colon.  Vascular/Lymphatic: Mild atherosclerotic calcifications of the nonaneurysmal abdominal aorta. No lymphadenopathy is identified.  Reproductive: Status post hysterectomy. No adnexal masses.  Other: No free air or free fluid.  Musculoskeletal: Degenerative changes of the lumbar spine  IMPRESSION: 1. There is a LEFT lower lobe consolidative opacity which is irregular and nodular in appearance. This could reflect infection but underlying space-occupying mass remains in the differential. Recommend improved evaluation with dedicated CT chest with contrast. 2. Multiple bladder diverticuli with mild bladder wall prominence. Recommend correlation with urinalysis to exclude cystitis. 3. No nephrolithiasis. 4. Moderate colonic stool burden. Small rectal stool ball.  These results will be called to the ordering clinician or representative by the Radiologist Assistant, and communication documented in the PACS or [employer].   Electronically Signed By: Valentino Saxon M.D. On: 03/14/2022 11:59  No results found for this or any  previous visit (from the past 24 hour(s)).

## 2022-04-23 NOTE — Progress Notes (Signed)
3:29 PM post void residual =70

## 2022-04-26 DIAGNOSIS — Q525 Fusion of labia: Secondary | ICD-10-CM | POA: Diagnosis not present

## 2022-04-28 DIAGNOSIS — F419 Anxiety disorder, unspecified: Secondary | ICD-10-CM | POA: Diagnosis not present

## 2022-04-29 ENCOUNTER — Encounter (HOSPITAL_COMMUNITY): Payer: Self-pay

## 2022-04-29 ENCOUNTER — Ambulatory Visit (HOSPITAL_COMMUNITY): Admission: RE | Admit: 2022-04-29 | Payer: Medicare PPO | Source: Ambulatory Visit

## 2022-04-29 DIAGNOSIS — N390 Urinary tract infection, site not specified: Secondary | ICD-10-CM | POA: Diagnosis not present

## 2022-04-30 DIAGNOSIS — F411 Generalized anxiety disorder: Secondary | ICD-10-CM | POA: Diagnosis not present

## 2022-05-05 DIAGNOSIS — I10 Essential (primary) hypertension: Secondary | ICD-10-CM | POA: Diagnosis not present

## 2022-05-05 DIAGNOSIS — F331 Major depressive disorder, recurrent, moderate: Secondary | ICD-10-CM | POA: Diagnosis not present

## 2022-05-09 ENCOUNTER — Ambulatory Visit (HOSPITAL_COMMUNITY): Admission: RE | Admit: 2022-05-09 | Payer: Medicare PPO | Source: Ambulatory Visit

## 2022-05-09 ENCOUNTER — Telehealth: Payer: Self-pay

## 2022-05-09 NOTE — Telephone Encounter (Signed)
Patient states she is having pressure and painful urination.  She is asking if you can send in something to get her through the weekend.

## 2022-05-12 DIAGNOSIS — G894 Chronic pain syndrome: Secondary | ICD-10-CM | POA: Diagnosis not present

## 2022-05-12 NOTE — Telephone Encounter (Signed)
Spoke with a nurse at [facility], she is aware of PA's response to patient's phone call.  Nurse states the facility had collected ua and culture last week and will re-fax results to our office.  Once we receive results I will forward to MD to review.

## 2022-05-13 DIAGNOSIS — F411 Generalized anxiety disorder: Secondary | ICD-10-CM | POA: Diagnosis not present

## 2022-05-13 NOTE — Telephone Encounter (Signed)
Results fwd to MD

## 2022-05-14 DIAGNOSIS — M79675 Pain in left toe(s): Secondary | ICD-10-CM | POA: Diagnosis not present

## 2022-05-14 DIAGNOSIS — H524 Presbyopia: Secondary | ICD-10-CM | POA: Diagnosis not present

## 2022-05-14 DIAGNOSIS — M79674 Pain in right toe(s): Secondary | ICD-10-CM | POA: Diagnosis not present

## 2022-05-14 DIAGNOSIS — B351 Tinea unguium: Secondary | ICD-10-CM | POA: Diagnosis not present

## 2022-05-16 NOTE — Telephone Encounter (Addendum)
Spoke with pts nurse Blanch Media,  she states that pt was not started on antibiotics at the time of the culture.  Confirmed with Blanch Media that facility will take verbal orders if MD reviews results and recommends treatment or wants to repeat culture.

## 2022-05-19 DIAGNOSIS — F411 Generalized anxiety disorder: Secondary | ICD-10-CM | POA: Diagnosis not present

## 2022-05-20 ENCOUNTER — Telehealth: Payer: Self-pay

## 2022-05-20 DIAGNOSIS — M7662 Achilles tendinitis, left leg: Secondary | ICD-10-CM | POA: Diagnosis not present

## 2022-05-20 DIAGNOSIS — M722 Plantar fascial fibromatosis: Secondary | ICD-10-CM | POA: Diagnosis not present

## 2022-05-20 DIAGNOSIS — M79672 Pain in left foot: Secondary | ICD-10-CM | POA: Diagnosis not present

## 2022-05-20 MED ORDER — SULFAMETHOXAZOLE-TRIMETHOPRIM 800-160 MG PO TABS: 1.0000 | ORAL_TABLET | Freq: Two times a day (BID) | ORAL | 0 refills | Status: DC

## 2022-05-20 NOTE — Addendum Note (Signed)
Addended by: Audie Box on: 05/20/2022 01:01 PM   Modules accepted: Orders

## 2022-05-20 NOTE — Addendum Note (Signed)
Addended by: Audie Box on: 05/20/2022 01:05 PM   Modules accepted: Orders

## 2022-05-20 NOTE — Addendum Note (Signed)
Addended by: Audie Box on: 05/20/2022 12:52 PM   Modules accepted: Orders

## 2022-05-20 NOTE — Telephone Encounter (Signed)
[facility] called advising that they received a prescription for an antibiotic for patient but she is allergic to it. They advised the only antibiotic she is able to take is Cefdinir 300 mg.   The facility requested a new prescription be faxed to the facility.   Thank you

## 2022-05-20 NOTE — Telephone Encounter (Signed)
Spoke with Blanch Media at [facility], she requested the rx be faxed to the facility at [phone] attn: Ebony Hail.  Blanch Media states pt already has a rx for zofran, rx faxed.

## 2022-05-21 NOTE — Telephone Encounter (Signed)
[facility] called advising that they received a prescription for an antibiotic for patient but she is allergic to it. They advised the only antibiotic she is able to take is Cefdinir 300 mg.    The facility requested a new prescription be faxed to the facility

## 2022-05-22 DIAGNOSIS — L500 Allergic urticaria: Secondary | ICD-10-CM | POA: Diagnosis not present

## 2022-05-22 DIAGNOSIS — F411 Generalized anxiety disorder: Secondary | ICD-10-CM | POA: Diagnosis not present

## 2022-05-22 DIAGNOSIS — L120 Bullous pemphigoid: Secondary | ICD-10-CM | POA: Diagnosis not present

## 2022-05-22 NOTE — Telephone Encounter (Signed)
Received call from patient's nurse(Brittany) from [facility] patient had allergy to antibiotic sent in per. Dr. Alyson Ingles.  Nurse made aware the allergy is nausea and vomiting and Dr. Alyson Ingles had sent in Zofran to help with that. Due to the patients long  list of allergies her options are very limited.  Nurse voiced understanding.

## 2022-05-26 DIAGNOSIS — F411 Generalized anxiety disorder: Secondary | ICD-10-CM | POA: Diagnosis not present

## 2022-05-30 DIAGNOSIS — F331 Major depressive disorder, recurrent, moderate: Secondary | ICD-10-CM | POA: Diagnosis not present

## 2022-05-30 DIAGNOSIS — E559 Vitamin D deficiency, unspecified: Secondary | ICD-10-CM | POA: Diagnosis not present

## 2022-05-30 DIAGNOSIS — I10 Essential (primary) hypertension: Secondary | ICD-10-CM | POA: Diagnosis not present

## 2022-05-30 DIAGNOSIS — M797 Fibromyalgia: Secondary | ICD-10-CM | POA: Diagnosis not present

## 2022-06-04 DIAGNOSIS — J019 Acute sinusitis, unspecified: Secondary | ICD-10-CM | POA: Diagnosis not present

## 2022-06-04 DIAGNOSIS — H6090 Unspecified otitis externa, unspecified ear: Secondary | ICD-10-CM | POA: Diagnosis not present

## 2022-06-05 DIAGNOSIS — F13232 Sedative, hypnotic or anxiolytic dependence with withdrawal with perceptual disturbance: Secondary | ICD-10-CM | POA: Diagnosis not present

## 2022-06-05 DIAGNOSIS — F331 Major depressive disorder, recurrent, moderate: Secondary | ICD-10-CM | POA: Diagnosis not present

## 2022-06-05 DIAGNOSIS — F419 Anxiety disorder, unspecified: Secondary | ICD-10-CM | POA: Diagnosis not present

## 2022-06-09 DIAGNOSIS — H6090 Unspecified otitis externa, unspecified ear: Secondary | ICD-10-CM | POA: Diagnosis not present

## 2022-06-09 DIAGNOSIS — J019 Acute sinusitis, unspecified: Secondary | ICD-10-CM | POA: Diagnosis not present

## 2022-06-09 DIAGNOSIS — H6123 Impacted cerumen, bilateral: Secondary | ICD-10-CM | POA: Diagnosis not present

## 2022-06-12 ENCOUNTER — Ambulatory Visit: Payer: Medicare PPO | Admitting: Obstetrics & Gynecology

## 2022-06-13 DIAGNOSIS — F411 Generalized anxiety disorder: Secondary | ICD-10-CM | POA: Diagnosis not present

## 2022-06-16 DIAGNOSIS — J069 Acute upper respiratory infection, unspecified: Secondary | ICD-10-CM | POA: Diagnosis not present

## 2022-06-16 DIAGNOSIS — J101 Influenza due to other identified influenza virus with other respiratory manifestations: Secondary | ICD-10-CM | POA: Diagnosis not present

## 2022-06-17 ENCOUNTER — Ambulatory Visit (HOSPITAL_COMMUNITY)
Admission: RE | Admit: 2022-06-17 | Discharge: 2022-06-17 | Disposition: A | Discharge status: Home / Self Care | Payer: Medicare PPO | Source: Ambulatory Visit | Attending: Family Medicine | Admitting: Family Medicine

## 2022-06-17 DIAGNOSIS — R059 Cough, unspecified: Secondary | ICD-10-CM | POA: Diagnosis not present

## 2022-06-17 DIAGNOSIS — R911 Solitary pulmonary nodule: Secondary | ICD-10-CM

## 2022-06-18 DIAGNOSIS — J069 Acute upper respiratory infection, unspecified: Secondary | ICD-10-CM | POA: Diagnosis not present

## 2022-06-20 ENCOUNTER — Other Ambulatory Visit (HOSPITAL_COMMUNITY): Payer: Self-pay | Admitting: Family Medicine

## 2022-06-20 DIAGNOSIS — R911 Solitary pulmonary nodule: Secondary | ICD-10-CM

## 2022-06-21 DIAGNOSIS — F411 Generalized anxiety disorder: Secondary | ICD-10-CM | POA: Diagnosis not present

## 2022-06-23 DIAGNOSIS — F411 Generalized anxiety disorder: Secondary | ICD-10-CM | POA: Diagnosis not present

## 2022-06-24 DIAGNOSIS — M19072 Primary osteoarthritis, left ankle and foot: Secondary | ICD-10-CM | POA: Diagnosis not present

## 2022-06-24 DIAGNOSIS — M25572 Pain in left ankle and joints of left foot: Secondary | ICD-10-CM | POA: Diagnosis not present

## 2022-06-25 ENCOUNTER — Encounter: Payer: Medicare PPO | Admitting: Registered Nurse

## 2022-06-28 DIAGNOSIS — I10 Essential (primary) hypertension: Secondary | ICD-10-CM | POA: Diagnosis not present

## 2022-06-28 DIAGNOSIS — K219 Gastro-esophageal reflux disease without esophagitis: Secondary | ICD-10-CM | POA: Diagnosis not present

## 2022-06-28 DIAGNOSIS — M069 Rheumatoid arthritis, unspecified: Secondary | ICD-10-CM | POA: Diagnosis not present

## 2022-06-30 DIAGNOSIS — F419 Anxiety disorder, unspecified: Secondary | ICD-10-CM | POA: Diagnosis not present

## 2022-06-30 DIAGNOSIS — F331 Major depressive disorder, recurrent, moderate: Secondary | ICD-10-CM | POA: Diagnosis not present

## 2022-07-01 DIAGNOSIS — F331 Major depressive disorder, recurrent, moderate: Secondary | ICD-10-CM | POA: Diagnosis not present

## 2022-07-01 DIAGNOSIS — H6123 Impacted cerumen, bilateral: Secondary | ICD-10-CM | POA: Diagnosis not present

## 2022-07-01 DIAGNOSIS — H9203 Otalgia, bilateral: Secondary | ICD-10-CM | POA: Diagnosis not present

## 2022-07-01 DIAGNOSIS — G8929 Other chronic pain: Secondary | ICD-10-CM | POA: Insufficient documentation

## 2022-07-01 DIAGNOSIS — I10 Essential (primary) hypertension: Secondary | ICD-10-CM | POA: Diagnosis not present

## 2022-07-01 DIAGNOSIS — H9193 Unspecified hearing loss, bilateral: Secondary | ICD-10-CM | POA: Insufficient documentation

## 2022-07-03 ENCOUNTER — Encounter: Payer: Self-pay | Admitting: Registered Nurse

## 2022-07-03 ENCOUNTER — Encounter: Payer: Medicare PPO | Attending: Registered Nurse | Admitting: Registered Nurse

## 2022-07-03 VITALS — BP 133/77 | HR 61 | Ht 66.0 in

## 2022-07-03 DIAGNOSIS — G629 Polyneuropathy, unspecified: Secondary | ICD-10-CM | POA: Diagnosis not present

## 2022-07-03 DIAGNOSIS — M797 Fibromyalgia: Secondary | ICD-10-CM | POA: Diagnosis not present

## 2022-07-03 DIAGNOSIS — Z79899 Other long term (current) drug therapy: Secondary | ICD-10-CM

## 2022-07-03 DIAGNOSIS — Z5181 Encounter for therapeutic drug level monitoring: Secondary | ICD-10-CM | POA: Diagnosis not present

## 2022-07-03 DIAGNOSIS — G894 Chronic pain syndrome: Secondary | ICD-10-CM | POA: Diagnosis not present

## 2022-07-03 DIAGNOSIS — M2550 Pain in unspecified joint: Secondary | ICD-10-CM | POA: Diagnosis not present

## 2022-07-03 DIAGNOSIS — M48061 Spinal stenosis, lumbar region without neurogenic claudication: Secondary | ICD-10-CM | POA: Diagnosis not present

## 2022-07-03 MED ORDER — TRAMADOL HCL 50 MG PO TABS: 50.0000 mg | ORAL_TABLET | Freq: Three times a day (TID) | ORAL | 2 refills | Status: DC | PRN

## 2022-07-03 NOTE — Progress Notes (Signed)
Subjective:    Patient ID: Robin Blackburn, female    DOB: 08/18/1945, 77 y.o.   MRN: [id number]  HPI: Robin Blackburn is a 77 y.o. female who returns for follow up appointment for chronic pain and medication refill. She states her pain is located in her lower back, left lower extremity and left foot pain. She also reports she has Fibro Pain. She rates her pain 9. Her current exercise regime is  performing stretching exercises.  Robin Blackburn Morphine equivalent is 15.00  MME.   Robin Blackburn lives at [facility], call placed to [employer], last fill of Tramadol was in January 2024.   Oral Swab was Performed today.      Pain Inventory Average Pain 8 Pain Right Now 9 My pain is constant, sharp, burning, dull, stabbing, tingling, and aching  In the last 24 hours, has pain interfered with the following? General activity 0 Relation with others 0 Enjoyment of life 0 What TIME of day is your pain at its worst? night Sleep (in general) Fair  Pain is worse with: walking, standing, and some activites Pain improves with: rest, therapy/exercise, and medication Relief from Meds: 6  Family History  Problem Relation Age of Onset   Hypertension Father    Transient ischemic attack Father    Hypertension Brother    CVA Maternal Grandfather    Leukemia Brother    Social History   Socioeconomic History   Marital status: Divorced    Spouse name: Not on file   Number of children: 0   Years of education: 12   Highest education level: 12th grade  Occupational History   Not on file  Tobacco Use   Smoking status: Never    Passive exposure: Never   Smokeless tobacco: Never  Vaping Use   Vaping Use: Never used  Substance and Sexual Activity   Alcohol use: No   Drug use: No   Sexual activity: Not Currently  Other Topics Concern   Not on file  Social History Narrative   Not on file   Social Determinants of Health   Financial Resource Strain: Low Risk  (01/06/2022)   Overall Financial  Resource Strain (CARDIA)    Difficulty of Paying Living Expenses: Not hard at all  Food Insecurity: No Food Insecurity (01/06/2022)   Hunger Vital Sign    Worried About Running Out of Food in the Last Year: Never true    Ran Out of Food in the Last Year: Never true  Transportation Needs: No Transportation Needs (01/06/2022)   PRAPARE - Hydrologist (Medical): No    Lack of Transportation (Non-Medical): No  Physical Activity: Inactive (01/06/2022)   Exercise Vital Sign    Days of Exercise per Week: 0 days    Minutes of Exercise per Session: 0 min  Stress: No Stress Concern Present (01/06/2022)   Rew    Feeling of Stress : Not at all  Social Connections: Moderately Isolated (01/06/2022)   Social Connection and Isolation Panel [NHANES]    Frequency of Communication with Friends and Family: More than three times a week    Frequency of Social Gatherings with Friends and Family: More than three times a week    Attends Religious Services: More than 4 times per year    Active Member of [employer] or Organizations: No    Attends Archivist Meetings: Never    Marital Status: Divorced  Past Surgical History:  Procedure Laterality Date   ABDOMINAL HYSTERECTOMY     ANKLE RECONSTRUCTION     APPENDECTOMY     BACK SURGERY     CHOLECYSTECTOMY     KNEE SURGERY     Past Surgical History:  Procedure Laterality Date   ABDOMINAL HYSTERECTOMY     ANKLE RECONSTRUCTION     APPENDECTOMY     BACK SURGERY     CHOLECYSTECTOMY     KNEE SURGERY     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   Depression    Fibromyalgia    Hypertension    Neuropathy    Rheumatoid arthritis ([place])    Stroke ([place]) 02/17/14   There were no vitals taken for this visit.  Opioid Risk Score:   Fall Risk Score:  `1  Depression screen  [facility] 2/9     03/25/2022    1:54 PM 01/06/2022    9:33 AM 11/22/2021    1:26 PM  Depression screen PHQ 2/9  Decreased Interest 1 0 0  Down, Depressed, Hopeless '1 1 1  '$ PHQ - 2 Score '2 1 1  '$ Altered sleeping   0  Tired, decreased energy   0  Change in appetite   0  Feeling bad or failure about yourself    0  Trouble concentrating   0  Moving slowly or fidgety/restless   0  PHQ-9 Score   1    Review of Systems  Musculoskeletal:  Positive for back pain and gait problem.       Left foot pain, left leg pain  All other systems reviewed and are negative.     Objective:   Physical Exam Vitals and nursing note reviewed.  Neck:     Comments: Cervical Paraspinal Tenderness: C-5-C-6  Cardiovascular:     Rate and Rhythm: Normal rate and regular rhythm.     Pulses: Normal pulses.     Heart sounds: Normal heart sounds.  Pulmonary:     Effort: Pulmonary effort is normal.     Breath sounds: Normal breath sounds.  Musculoskeletal:     Cervical back: Normal range of motion and neck supple.     Comments: Normal Muscle Bulk and Muscle Testing Reveals:  Upper Extremities: Full ROM and Muscle Strength 5/5 Bilateral AC Joint Tenderness Thoracic Paraspinal Tenderness: T-4-T-6 Mainly Right Side Lower Extremities: Decreased ROM and Muscle Strength 5/5 Bilateral Lower Extremities Flexion Produces Pain into her Bilateral Lower Extremities Arrived In Wheelchair     Skin:    General: Skin is warm and dry.  Neurological:     Mental Status: She is alert and oriented to person, place, and time.  Psychiatric:        Mood and Affect: Mood normal.        Behavior: Behavior normal.         Assessment & Plan:  Lumbar Spinal Stenosis: Continue HEP as Tolerated. Continue to Monitor. 07/03/2022 Fibromyalgia: Continue  Gabapentin 300 mg one capsule every 8 hours. Continue Physical [facility] at [facility] with Physical Therapist.  Chronic Pain Syndrome: Continue : Lidocaine Patches and Refill: Tramadol  50 mg one tablet every 8 hours as needed fpr pain #90.  We will continue the opioid monitoring program, this consists of regular clinic visits, examinations, urine drug screen, pill counts as well as use of [place] Controlled Substance Reporting system. A  12 month History has been reviewed on the [place] on 07/03/2022.  4. Polyarthralgia: Continue HEP as tolerated. Continue to Monitor. 07/03/2022   F/U in 3 Months

## 2022-07-04 ENCOUNTER — Encounter: Payer: Self-pay | Admitting: Urology

## 2022-07-04 ENCOUNTER — Ambulatory Visit (INDEPENDENT_AMBULATORY_CARE_PROVIDER_SITE_OTHER): Payer: Medicare PPO | Admitting: Urology

## 2022-07-04 VITALS — BP 134/84 | HR 66

## 2022-07-04 DIAGNOSIS — N390 Urinary tract infection, site not specified: Secondary | ICD-10-CM

## 2022-07-04 DIAGNOSIS — R300 Dysuria: Secondary | ICD-10-CM | POA: Diagnosis not present

## 2022-07-04 DIAGNOSIS — F411 Generalized anxiety disorder: Secondary | ICD-10-CM | POA: Diagnosis not present

## 2022-07-04 LAB — BLADDER SCAN AMB NON-IMAGING: Scan Result: 171

## 2022-07-04 MED ORDER — NITROFURANTOIN MACROCRYSTAL 50 MG PO CAPS: 50.0000 mg | ORAL_CAPSULE | Freq: Every day | ORAL | 11 refills | Status: DC

## 2022-07-04 MED ORDER — NITROFURANTOIN MONOHYD MACRO 100 MG PO CAPS: 100.0000 mg | ORAL_CAPSULE | Freq: Two times a day (BID) | ORAL | 0 refills | Status: DC

## 2022-07-04 NOTE — Patient Instructions (Signed)
Urinary Tract Infection, Adult  A urinary tract infection (UTI) is an infection of any part of the urinary tract. The urinary tract includes the kidneys, ureters, bladder, and urethra. These organs make, store, and get rid of urine in the body. An upper UTI affects the ureters and kidneys. A lower UTI affects the bladder and urethra. What are the causes? Most urinary tract infections are caused by bacteria in your genital area around your urethra, where urine leaves your body. These bacteria grow and cause inflammation of your urinary tract. What increases the risk? You are more likely to develop this condition if: You have a urinary catheter that stays in place. You are not able to control when you urinate or have a bowel movement (incontinence). You are female and you: Use a spermicide or diaphragm for birth control. Have low estrogen levels. Are pregnant. You have certain genes that increase your risk. You are sexually active. You take antibiotic medicines. You have a condition that causes your flow of urine to slow down, such as: An enlarged prostate, if you are female. Blockage in your urethra. A kidney stone. A nerve condition that affects your bladder control (neurogenic bladder). Not getting enough to drink, or not urinating often. You have certain medical conditions, such as: Diabetes. A weak disease-fighting system (immunesystem). Sickle cell disease. Gout. Spinal cord injury. What are the signs or symptoms? Symptoms of this condition include: Needing to urinate right away (urgency). Frequent urination. This may include small amounts of urine each time you urinate. Pain or burning with urination. Blood in the urine. Urine that smells bad or unusual. Trouble urinating. Cloudy urine. Vaginal discharge, if you are female. Pain in the abdomen or the lower back. You may also have: Vomiting or a decreased appetite. Confusion. Irritability or tiredness. A fever or  chills. Diarrhea. The first symptom in older adults may be confusion. In some cases, they may not have any symptoms until the infection has worsened. How is this diagnosed? This condition is diagnosed based on your medical history and a physical exam. You may also have other tests, including: Urine tests. Blood tests. Tests for STIs (sexually transmitted infections). If you have had more than one UTI, a cystoscopy or imaging studies may be done to determine the cause of the infections. How is this treated? Treatment for this condition includes: Antibiotic medicine. Over-the-counter medicines to treat discomfort. Drinking enough water to stay hydrated. If you have frequent infections or have other conditions such as a kidney stone, you may need to see a health care provider who specializes in the urinary tract (urologist). In rare cases, urinary tract infections can cause sepsis. Sepsis is a life-threatening condition that occurs when the body responds to an infection. Sepsis is treated in the hospital with IV antibiotics, fluids, and other medicines. Follow these instructions at home:  Medicines Take over-the-counter and prescription medicines only as told by your health care provider. If you were prescribed an antibiotic medicine, take it as told by your health care provider. Do not stop using the antibiotic even if you start to feel better. General instructions Make sure you: Empty your bladder often and completely. Do not hold urine for long periods of time. Empty your bladder after sex. Wipe from front to back after urinating or having a bowel movement if you are female. Use each tissue only one time when you wipe. Drink enough fluid to keep your urine pale yellow. Keep all follow-up visits. This is important. Contact a health   care provider if: Your symptoms do not get better after 1-2 days. Your symptoms go away and then return. Get help right away if: You have severe pain in  your back or your lower abdomen. You have a fever or chills. You have nausea or vomiting. Summary A urinary tract infection (UTI) is an infection of any part of the urinary tract, which includes the kidneys, ureters, bladder, and urethra. Most urinary tract infections are caused by bacteria in your genital area. Treatment for this condition often includes antibiotic medicines. If you were prescribed an antibiotic medicine, take it as told by your health care provider. Do not stop using the antibiotic even if you start to feel better. Keep all follow-up visits. This is important. This information is not intended to replace advice given to you by your health care provider. Make sure you discuss any questions you have with your health care provider. Document Revised: 01/06/2020 Document Reviewed: 01/06/2020 Elsevier Patient Education  2023 [employer] Inc[employer]  

## 2022-07-04 NOTE — Progress Notes (Signed)
07/04/2022 9:57 AM   Robin Blackburn [birth year] [id number]  Referring provider: Redmond School, MD [address] [place],  [place] [address]  Followup dysuria   HPI: Robin Blackburn is a 77yo here for followup for dysuria and recurrent UTI. She still has intermittent pelvic pain and dysuria. She has no documented UTIs since she was last seen in our office. She has urinary frequency and urgency. 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 She is scheduled for a chest CT 1/30.    PMH: 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   Depression    Fibromyalgia    Hypertension    Neuropathy    Rheumatoid arthritis ([place])    Stroke ([place]) 02/17/14    Surgical History: Past Surgical History:  Procedure Laterality Date   ABDOMINAL HYSTERECTOMY     ANKLE RECONSTRUCTION     APPENDECTOMY     BACK SURGERY     CHOLECYSTECTOMY     KNEE SURGERY      Home Medications:  Allergies as of 07/04/2022       Reactions   Demerol [meperidine] Anaphylaxis   Hydromorphone Anaphylaxis   Other reaction(s): Unknown Other reaction(s): Unknown   Keflex [cephalexin] Nausea And Vomiting   Penicillins Anaphylaxis   Has patient had a PCN reaction causing immediate rash, facial/tongue/throat swelling, SOB or lightheadedness with hypotension: Yes Has patient had a PCN reaction causing severe rash involving mucus membranes or skin necrosis: No Has patient had a PCN reaction that required hospitalization No Has patient had a PCN reaction occurring within the last 10 years: No If all of the above answers are "NO", then may proceed with Cephalosporin use. Other reaction(s): Unknown, Unknown Other reaction(s): Unknown, Unknown   Sulfa Antibiotics Nausea And Vomiting   Other reaction(s): Unknown Other reaction(s): Unknown Other reaction(s): Unknown   Acetaminophen    Other reaction(s): Unknown Other  reaction(s): Unknown   Afluria Preservative Free [influenza Vac Split Quad]    Other reaction(s): Unknown   Bisphosphonates    GI intolerance Other reaction(s): Unknown, Unknown Other reaction(s): Unknown, Unknown   Codeine    Fentanyl Other (See Comments)   "felt like I had demons in my head" Other reaction(s): Unknown, Unknown Other reaction(s): Unknown, Unknown   Influenza Virus Vaccine Other (See Comments)   Other reaction(s): Unknown   Iodine    Other reaction(s): Unknown Other reaction(s): Unknown   Iohexol    Other reaction(s): Unknown, Unknown Other reaction(s): Unknown, Unknown   Levofloxacin    Other reaction(s): Unknown, Unknown Other reaction(s): Unknown, Unknown   Meperidine Hcl    Other reaction(s): Unknown, Unknown Other reaction(s): Unknown, Unknown   Misc. Sulfonamide Containing Compounds    Other reaction(s): Unknown   Morphine    Other reaction(s): Unknown, Unknown Other reaction(s): Unknown, Unknown   Oxycodone    Other reaction(s): Unknown Other reaction(s): Unknown   Oxycodone Hcl    Other reaction(s): Unknown Other reaction(s): Unknown   Oxytetracycline Nausea And Vomiting   Other reaction(s): Unknown Other reaction(s): Unknown   Penicillamine    Percocet [oxycodone-acetaminophen] Swelling   Mouth swelling.   Shellfish Allergy    Glucosamine not an option   Ciprofloxacin Nausea Only, Rash   Other reaction(s): Unknown, Unknown Other reaction(s): Unknown, Unknown   Levaquin [levofloxacin In D5w] Nausea And Vomiting, Rash   Morphine And Related Rash  Medication List        Accurate as of July 04, 2022  9:57 AM. If you have any questions, ask your nurse or doctor.          acetaminophen 500 MG tablet Commonly known as: TYLENOL Take 500 mg by mouth every 6 (six) hours as needed for mild pain or moderate pain.   ascorbic acid 500 MG tablet Commonly known as: VITAMIN C Take 500 mg by mouth daily.   benzocaine 10 %  mucosal gel Commonly known as: ORAJEL Use as directed 1 application in the mouth or throat as needed for mouth pain.   chlorhexidine 0.12 % solution Commonly known as: PERIDEX Use as directed 15 mLs in the mouth or throat 2 (two) times daily.   Cholecalciferol 125 MCG (5000 UT) Tabs Take 1 tablet by mouth daily.   clobetasol cream 0.05 % Commonly known as: TEMOVATE Apply 1 application topically 2 (two) times daily.   clopidogrel 75 MG tablet Commonly known as: PLAVIX Take 75 mg by mouth at bedtime.   Cranberry 250 MG Tabs Take by mouth.   desloratadine 5 MG tablet Commonly known as: CLARINEX Take 5 mg by mouth every morning.   dicyclomine 20 MG tablet Commonly known as: BENTYL Take 20 mg by mouth every 6 (six) hours. Abdominal cramps   fluticasone 50 MCG/ACT nasal spray Commonly known as: FLONASE Place 1 spray into both nostrils daily.   gabapentin 300 MG capsule Commonly known as: NEURONTIN Take 1 capsule (300 mg total) by mouth 3 (three) times daily.   guaiFENesin 600 MG 12 hr tablet Commonly known as: MUCINEX Take 400 mg by mouth 2 (two) times daily.   hydrOXYzine 25 MG tablet Commonly known as: ATARAX Take 25 mg by mouth at bedtime.   leflunomide 10 MG tablet Commonly known as: ARAVA 1 tablet   lidocaine 5 % Commonly known as: LIDODERM Place 1 patch onto the skin every 12 (twelve) hours. Remove & Discard patch within 12 hours or as directed   loperamide 2 MG tablet Commonly known as: IMODIUM A-D Take 2 mg by mouth 4 (four) times daily as needed for diarrhea or loose stools.   Melatonin 2.5 MG Caps Take by mouth 2 (two) times daily.   methocarbamol 750 MG tablet Commonly known as: ROBAXIN Take 1 tablet (750 mg total) by mouth every 6 (six) hours as needed for muscle spasms.   metoprolol tartrate 25 MG tablet Commonly known as: LOPRESSOR Take 1 tablet (25 mg total) by mouth 2 (two) times daily.   multivitamin tablet Take 1 tablet by mouth  daily.   nystatin powder Commonly known as: MYCOSTATIN/NYSTOP Apply topically.   ondansetron 4 MG tablet Commonly known as: ZOFRAN Take 4 mg by mouth every 6 (six) hours as needed for nausea or vomiting.   oxybutynin 5 MG tablet Commonly known as: DITROPAN Take 1 tablet (5 mg total) by mouth 2 (two) times daily.   phenazopyridine 100 MG tablet Commonly known as: Pyridium Take 1 tablet (100 mg total) by mouth 2 (two) times daily as needed for pain.   QUEtiapine 25 MG tablet Commonly known as: SEROQUEL Take 0.5 tablets (12.5 mg total) by mouth 2 (two) times daily.   Restasis 0.05 % ophthalmic emulsion Generic drug: cycloSPORINE Place 1 drop into both eyes 2 (two) times daily.   saccharomyces boulardii 250 MG capsule Commonly known as: FLORASTOR Take 250 mg by mouth 2 (two) times daily.   simethicone 125 MG chewable tablet Commonly  known as: MYLICON Chew 740 mg by mouth every 6 (six) hours as needed for flatulence.   sodium chloride 0.65 % Soln nasal spray Commonly known as: OCEAN Place 1 spray into both nostrils as needed for congestion. What changed: when to take this   sulfamethoxazole-trimethoprim 800-160 MG tablet Commonly known as: BACTRIM DS Take 1 tablet by mouth 2 (two) times daily.   ticagrelor 90 MG Tabs tablet Commonly known as: BRILINTA Take by mouth 2 (two) times daily.   traMADol 50 MG tablet Commonly known as: ULTRAM Take 1 tablet (50 mg total) by mouth every 8 (eight) hours as needed.   traZODone 50 MG tablet Commonly known as: DESYREL Take 50 mg by mouth at bedtime.   Xanax 0.25 MG tablet Generic drug: ALPRAZolam 1 tablet        Allergies:  Allergies  Allergen Reactions   Demerol [Meperidine] Anaphylaxis   Hydromorphone Anaphylaxis    Other reaction(s): Unknown Other reaction(s): Unknown   Keflex [Cephalexin] Nausea And Vomiting   Penicillins Anaphylaxis    Has patient had a PCN reaction causing immediate rash,  facial/tongue/throat swelling, SOB or lightheadedness with hypotension: Yes Has patient had a PCN reaction causing severe rash involving mucus membranes or skin necrosis: No Has patient had a PCN reaction that required hospitalization No Has patient had a PCN reaction occurring within the last 10 years: No If all of the above answers are "NO", then may proceed with Cephalosporin use.  Other reaction(s): Unknown, Unknown Other reaction(s): Unknown, Unknown   Sulfa Antibiotics Nausea And Vomiting    Other reaction(s): Unknown Other reaction(s): Unknown Other reaction(s): Unknown   Acetaminophen     Other reaction(s): Unknown Other reaction(s): Unknown   Afluria Preservative Free [Influenza Vac Split Quad]     Other reaction(s): Unknown   Bisphosphonates     GI intolerance Other reaction(s): Unknown, Unknown Other reaction(s): Unknown, Unknown   Codeine    Fentanyl Other (See Comments)    "felt like I had demons in my head" Other reaction(s): Unknown, Unknown Other reaction(s): Unknown, Unknown   Influenza Virus Vaccine Other (See Comments)    Other reaction(s): Unknown   Iodine     Other reaction(s): Unknown Other reaction(s): Unknown   Iohexol     Other reaction(s): Unknown, Unknown Other reaction(s): Unknown, Unknown   Levofloxacin     Other reaction(s): Unknown, Unknown Other reaction(s): Unknown, Unknown   Meperidine Hcl     Other reaction(s): Unknown, Unknown Other reaction(s): Unknown, Unknown   Misc. Sulfonamide Containing Compounds     Other reaction(s): Unknown   Morphine     Other reaction(s): Unknown, Unknown Other reaction(s): Unknown, Unknown   Oxycodone     Other reaction(s): Unknown Other reaction(s): Unknown   Oxycodone Hcl     Other reaction(s): Unknown Other reaction(s): Unknown   Oxytetracycline Nausea And Vomiting    Other reaction(s): Unknown Other reaction(s): Unknown   Penicillamine    Percocet [Oxycodone-Acetaminophen] Swelling    Mouth  swelling.   Shellfish Allergy     Glucosamine not an option   Ciprofloxacin Nausea Only and Rash    Other reaction(s): Unknown, Unknown Other reaction(s): Unknown, Unknown   Levaquin [Levofloxacin In D5w] Nausea And Vomiting and Rash   Morphine And Related Rash    Family History: Family History  Problem Relation Age of Onset   Hypertension Father    Transient ischemic attack Father    Hypertension Brother    Leukemia Brother    CVA Maternal Grandfather  Social History:  reports that she has never smoked. She has never been exposed to tobacco smoke. She has never used smokeless tobacco. She reports that she does not drink alcohol and does not use drugs.  ROS: All other review of systems were reviewed and are negative except what is noted above in HPI  Physical Exam: BP 134/84   Pulse 66   Constitutional:  Alert and oriented, No acute distress. HEENT: [place] AT, moist mucus membranes.  Trachea midline, no masses. Cardiovascular: No clubbing, cyanosis, or edema. Respiratory: Normal respiratory effort, no increased work of breathing. GI: Abdomen is soft, nontender, nondistended, no abdominal masses GU: No CVA tenderness.  Lymph: No cervical or inguinal lymphadenopathy. Skin: No rashes, bruises or suspicious lesions. Neurologic: Grossly intact, no focal deficits, moving all 4 extremities. Psychiatric: Normal mood and affect.  Laboratory Data: Lab Results  Component Value Date   WBC 11.3 (H) 08/23/2018   HGB 12.6 08/23/2018   HCT 41.5 08/23/2018   MCV 95.8 08/23/2018   PLT 422 (H) 08/23/2018    Lab Results  Component Value Date   CREATININE 0.78 08/23/2018    No results found for: "PSA"  No results found for: "TESTOSTERONE"  Lab Results  Component Value Date   HGBA1C 6.1 (H) 08/15/2015    Urinalysis    Component Value Date/Time   COLORURINE STRAW (A) 08/20/2018 1225   APPEARANCEUR Cloudy (A) 12/11/2021 1442   LABSPEC 1.004 (L) 08/20/2018 1225   PHURINE  6.0 08/20/2018 1225   GLUCOSEU Negative 12/11/2021 1442   HGBUR MODERATE (A) 08/20/2018 1225   BILIRUBINUR Negative 12/11/2021 1442   KETONESUR NEGATIVE 08/20/2018 1225   PROTEINUR 2+ (A) 12/11/2021 1442   PROTEINUR NEGATIVE 08/20/2018 1225   UROBILINOGEN 0.2 04/10/2015 2212   NITRITE Positive (A) 12/11/2021 1442   NITRITE NEGATIVE 08/20/2018 1225   LEUKOCYTESUR 3+ (A) 12/11/2021 1442   LEUKOCYTESUR NEGATIVE 08/20/2018 1225    Lab Results  Component Value Date   LABMICR See below: 12/11/2021   WBCUA >30 (A) 12/11/2021   LABEPIT 0-10 12/11/2021   MUCUS Present 12/11/2021   BACTERIA Many (A) 12/11/2021    Pertinent Imaging: CT 03/2022: Images reviewed and discussed with the patient  No results found for this or any previous visit.  Results for orders placed during the hospital encounter of 02/19/14  US Venous Img Lower Bilateral  Narrative CLINICAL DATA:  Bilateral lower extremity pain and swelling  EXAM: BILATERAL LOWER EXTREMITY VENOUS DOPPLER ULTRASOUND  TECHNIQUE: Gray-scale sonography with graded compression, as well as color Doppler and duplex ultrasound were performed to evaluate the lower extremity deep venous systems from the level of the common femoral vein and including the common femoral, femoral, profunda femoral, popliteal and calf veins including the posterior tibial, peroneal and gastrocnemius veins when visible. The superficial great saphenous vein was also interrogated. Spectral Doppler was utilized to evaluate flow at rest and with distal augmentation maneuvers in the common femoral, femoral and popliteal veins.  COMPARISON:  Prior left lower extremity duplex venous ultrasound 02/24/2008  FINDINGS: RIGHT LOWER EXTREMITY  Common Femoral Vein: No evidence of thrombus. Normal compressibility, respiratory phasicity and response to augmentation.  Saphenofemoral Junction: No evidence of thrombus. Normal compressibility and flow on color Doppler  imaging.  Profunda Femoral Vein: No evidence of thrombus. Normal compressibility and flow on color Doppler imaging.  Femoral Vein: No evidence of thrombus. Normal compressibility, respiratory phasicity and response to augmentation.  Popliteal Vein: No evidence of thrombus. Normal compressibility, respiratory phasicity and  response to augmentation.  Calf Veins: No evidence of thrombus. Normal compressibility and flow on color Doppler imaging.  Superficial Great Saphenous Vein: No evidence of thrombus. Normal compressibility and flow on color Doppler imaging.  Venous Reflux:  None.  Other Findings:  None.  LEFT LOWER EXTREMITY  Common Femoral Vein: No evidence of thrombus. Normal compressibility, respiratory phasicity and response to augmentation.  Saphenofemoral Junction: No evidence of thrombus. Normal compressibility and flow on color Doppler imaging.  Profunda Femoral Vein: No evidence of thrombus. Normal compressibility and flow on color Doppler imaging.  Femoral Vein: No evidence of thrombus. Normal compressibility, respiratory phasicity and response to augmentation.  Popliteal Vein: No evidence of thrombus. Normal compressibility, respiratory phasicity and response to augmentation.  Calf Veins: No evidence of thrombus. Normal compressibility and flow on color Doppler imaging.  Superficial Great Saphenous Vein: No evidence of thrombus. Normal compressibility and flow on color Doppler imaging.  Venous Reflux:  None.  Other Findings:  None.  IMPRESSION: No evidence of deep venous thrombosis.   Electronically Signed By: Jacqulynn Cadet M.D. On: 02/21/2014 07:47  No results found for this or any previous visit.  No results found for this or any previous visit.  No results found for this or any previous visit.  No valid procedures specified. No results found for this or any previous visit.  Results for orders placed in visit on 02/12/22  CT RENAL  STONE STUDY  Narrative CLINICAL DATA:  Flank pain, kidney stone suspected  EXAM: CT ABDOMEN AND PELVIS WITHOUT CONTRAST  TECHNIQUE: Multidetector CT imaging of the abdomen and pelvis was performed following the standard protocol without IV contrast.  RADIATION DOSE REDUCTION: This exam was performed according to the departmental dose-optimization program which includes automated exposure control, adjustment of the mA and/or kV according to patient size and/or use of iterative reconstruction technique.  COMPARISON:  August 20, 2018  FINDINGS: Evaluation is limited by lack of IV contrast.  Lower chest: LEFT irregular nodular appearing lower lobe consolidative opacity, incompletely assessed  Hepatobiliary: Unremarkable noncontrast appearance of the liver. Status post cholecystectomy. Mild prominence of the common bile duct, most consistent with post cholecystectomy state.  Pancreas: Fatty infiltration of the pancreas. No peripancreatic fat stranding is identified.  Spleen: Unremarkable.  Adrenals/Urinary Tract: Adrenal glands are unremarkable. No hydronephrosis. No obstructing nephrolithiasis. Multiple bladder diverticuli. Mild bladder wall prominence. No nephrolithiasis are identified.  Stomach/Bowel: No evidence of bowel obstruction. Small rectal stool ball. Diverticulosis without evidence of acute diverticulitis. Moderate colonic stool burden throughout the colon.  Vascular/Lymphatic: Mild atherosclerotic calcifications of the nonaneurysmal abdominal aorta. No lymphadenopathy is identified.  Reproductive: Status post hysterectomy. No adnexal masses.  Other: No free air or free fluid.  Musculoskeletal: Degenerative changes of the lumbar spine  IMPRESSION: 1. There is a LEFT lower lobe consolidative opacity which is irregular and nodular in appearance. This could reflect infection but underlying space-occupying mass remains in the differential. Recommend improved  evaluation with dedicated CT chest with contrast. 2. Multiple bladder diverticuli with mild bladder wall prominence. Recommend correlation with urinalysis to exclude cystitis. 3. No nephrolithiasis. 4. Moderate colonic stool burden. Small rectal stool ball.  These results will be called to the ordering clinician or representative by the Radiologist Assistant, and communication documented in the PACS or [employer].   Electronically Signed By: Valentino Saxon M.D. On: 03/14/2022 11:59   Assessment & Plan:    1. Dysuria -macrobid '100mg'$  BID for 7 days and then start macrodantin '50mg'$  qhs -  BLADDER SCAN AMB NON-IMAGING  2. Recurrent UTI -Macrobid '100mg'$  BID   No follow-ups on file.  Nicolette Bang, MD  [facility] [place]

## 2022-07-04 NOTE — Progress Notes (Signed)
post void residual=171

## 2022-07-07 DIAGNOSIS — N3020 Other chronic cystitis without hematuria: Secondary | ICD-10-CM | POA: Diagnosis not present

## 2022-07-07 DIAGNOSIS — R3989 Other symptoms and signs involving the genitourinary system: Secondary | ICD-10-CM | POA: Diagnosis not present

## 2022-07-08 ENCOUNTER — Encounter (HOSPITAL_COMMUNITY): Payer: Self-pay

## 2022-07-08 ENCOUNTER — Ambulatory Visit (HOSPITAL_COMMUNITY): Admission: RE | Admit: 2022-07-08 | Payer: Medicare PPO | Source: Ambulatory Visit

## 2022-07-08 DIAGNOSIS — F411 Generalized anxiety disorder: Secondary | ICD-10-CM | POA: Diagnosis not present

## 2022-07-08 LAB — DRUG TOX MONITOR 1 W/CONF, ORAL FLD

## 2022-07-09 DIAGNOSIS — M069 Rheumatoid arthritis, unspecified: Secondary | ICD-10-CM | POA: Diagnosis not present

## 2022-07-09 DIAGNOSIS — M797 Fibromyalgia: Secondary | ICD-10-CM | POA: Diagnosis not present

## 2022-07-09 DIAGNOSIS — G894 Chronic pain syndrome: Secondary | ICD-10-CM | POA: Diagnosis not present

## 2022-07-10 DIAGNOSIS — H353132 Nonexudative age-related macular degeneration, bilateral, intermediate dry stage: Secondary | ICD-10-CM | POA: Diagnosis not present

## 2022-07-10 DIAGNOSIS — H35443 Age-related reticular degeneration of retina, bilateral: Secondary | ICD-10-CM | POA: Diagnosis not present

## 2022-07-10 DIAGNOSIS — H04123 Dry eye syndrome of bilateral lacrimal glands: Secondary | ICD-10-CM | POA: Diagnosis not present

## 2022-07-10 DIAGNOSIS — H43813 Vitreous degeneration, bilateral: Secondary | ICD-10-CM | POA: Diagnosis not present

## 2022-07-12 LAB — DRUG TOX ALC METAB W/CON, ORAL FLD: Alcohol Metabolite: NEGATIVE ng/mL (ref <25)

## 2022-07-14 DIAGNOSIS — S91009A Unspecified open wound, unspecified ankle, initial encounter: Secondary | ICD-10-CM | POA: Diagnosis not present

## 2022-07-14 DIAGNOSIS — R600 Localized edema: Secondary | ICD-10-CM | POA: Diagnosis not present

## 2022-07-21 ENCOUNTER — Ambulatory Visit: Payer: Medicare PPO | Admitting: Urology

## 2022-07-23 ENCOUNTER — Ambulatory Visit: Payer: Medicare PPO | Admitting: Obstetrics & Gynecology

## 2022-07-25 DIAGNOSIS — S91009A Unspecified open wound, unspecified ankle, initial encounter: Secondary | ICD-10-CM | POA: Diagnosis not present

## 2022-07-29 DIAGNOSIS — I10 Essential (primary) hypertension: Secondary | ICD-10-CM | POA: Diagnosis not present

## 2022-07-29 DIAGNOSIS — F331 Major depressive disorder, recurrent, moderate: Secondary | ICD-10-CM | POA: Diagnosis not present

## 2022-07-30 DIAGNOSIS — F411 Generalized anxiety disorder: Secondary | ICD-10-CM | POA: Diagnosis not present

## 2022-07-30 DIAGNOSIS — G478 Other sleep disorders: Secondary | ICD-10-CM | POA: Diagnosis not present

## 2022-07-30 DIAGNOSIS — F331 Major depressive disorder, recurrent, moderate: Secondary | ICD-10-CM | POA: Diagnosis not present

## 2022-07-31 DIAGNOSIS — K589 Irritable bowel syndrome without diarrhea: Secondary | ICD-10-CM | POA: Diagnosis not present

## 2022-07-31 DIAGNOSIS — K219 Gastro-esophageal reflux disease without esophagitis: Secondary | ICD-10-CM | POA: Diagnosis not present

## 2022-07-31 DIAGNOSIS — G629 Polyneuropathy, unspecified: Secondary | ICD-10-CM | POA: Diagnosis not present

## 2022-07-31 DIAGNOSIS — M069 Rheumatoid arthritis, unspecified: Secondary | ICD-10-CM | POA: Diagnosis not present

## 2022-07-31 DIAGNOSIS — I10 Essential (primary) hypertension: Secondary | ICD-10-CM | POA: Diagnosis not present

## 2022-07-31 DIAGNOSIS — G8929 Other chronic pain: Secondary | ICD-10-CM | POA: Diagnosis not present

## 2022-07-31 DIAGNOSIS — R059 Cough, unspecified: Secondary | ICD-10-CM | POA: Diagnosis not present

## 2022-07-31 DIAGNOSIS — R911 Solitary pulmonary nodule: Secondary | ICD-10-CM | POA: Diagnosis not present

## 2022-08-01 DIAGNOSIS — F419 Anxiety disorder, unspecified: Secondary | ICD-10-CM | POA: Diagnosis not present

## 2022-08-04 DIAGNOSIS — M797 Fibromyalgia: Secondary | ICD-10-CM | POA: Diagnosis not present

## 2022-08-05 DIAGNOSIS — F13232 Sedative, hypnotic or anxiolytic dependence with withdrawal with perceptual disturbance: Secondary | ICD-10-CM | POA: Diagnosis not present

## 2022-08-05 DIAGNOSIS — F331 Major depressive disorder, recurrent, moderate: Secondary | ICD-10-CM | POA: Diagnosis not present

## 2022-08-05 DIAGNOSIS — F419 Anxiety disorder, unspecified: Secondary | ICD-10-CM | POA: Diagnosis not present

## 2022-08-06 ENCOUNTER — Telehealth: Payer: Self-pay

## 2022-08-06 NOTE — Telephone Encounter (Signed)
Tried call with no answer, left vm for return call

## 2022-08-06 NOTE — Telephone Encounter (Signed)
Return call to Naomi,Verbal from Dr. Alyson Ingles to do a clean cath urinalysis to check for infection. Naomi voiced understanding.

## 2022-08-06 NOTE — Telephone Encounter (Signed)
Pt is complaining of pain and having to press on bladder to urinate.   [facility] nurse - Delcie Roch called and left a message [phone]

## 2022-08-07 DIAGNOSIS — M722 Plantar fascial fibromatosis: Secondary | ICD-10-CM | POA: Diagnosis not present

## 2022-08-18 DIAGNOSIS — I10 Essential (primary) hypertension: Secondary | ICD-10-CM | POA: Diagnosis not present

## 2022-08-18 DIAGNOSIS — R41841 Cognitive communication deficit: Secondary | ICD-10-CM | POA: Diagnosis not present

## 2022-08-21 ENCOUNTER — Ambulatory Visit: Payer: Medicare PPO | Admitting: Obstetrics and Gynecology

## 2022-08-22 ENCOUNTER — Ambulatory Visit (HOSPITAL_COMMUNITY)
Admission: RE | Admit: 2022-08-22 | Discharge: 2022-08-22 | Disposition: A | Discharge status: Home / Self Care | Payer: Medicare PPO | Source: Ambulatory Visit | Attending: Family Medicine | Admitting: Family Medicine

## 2022-08-22 DIAGNOSIS — R911 Solitary pulmonary nodule: Secondary | ICD-10-CM | POA: Insufficient documentation

## 2022-08-22 DIAGNOSIS — F411 Generalized anxiety disorder: Secondary | ICD-10-CM | POA: Diagnosis not present

## 2022-08-22 DIAGNOSIS — R918 Other nonspecific abnormal finding of lung field: Secondary | ICD-10-CM | POA: Diagnosis not present

## 2022-08-22 DIAGNOSIS — Z79899 Other long term (current) drug therapy: Secondary | ICD-10-CM | POA: Diagnosis not present

## 2022-08-25 DIAGNOSIS — R600 Localized edema: Secondary | ICD-10-CM | POA: Diagnosis not present

## 2022-08-26 DIAGNOSIS — R799 Abnormal finding of blood chemistry, unspecified: Secondary | ICD-10-CM | POA: Diagnosis not present

## 2022-08-26 DIAGNOSIS — M130 Polyarthritis, unspecified: Secondary | ICD-10-CM | POA: Diagnosis not present

## 2022-08-26 DIAGNOSIS — M79671 Pain in right foot: Secondary | ICD-10-CM | POA: Diagnosis not present

## 2022-08-26 DIAGNOSIS — Z8739 Personal history of other diseases of the musculoskeletal system and connective tissue: Secondary | ICD-10-CM | POA: Diagnosis not present

## 2022-08-26 DIAGNOSIS — M797 Fibromyalgia: Secondary | ICD-10-CM | POA: Diagnosis not present

## 2022-08-26 DIAGNOSIS — Z79899 Other long term (current) drug therapy: Secondary | ICD-10-CM | POA: Diagnosis not present

## 2022-08-26 DIAGNOSIS — M1990 Unspecified osteoarthritis, unspecified site: Secondary | ICD-10-CM | POA: Diagnosis not present

## 2022-08-26 DIAGNOSIS — I509 Heart failure, unspecified: Secondary | ICD-10-CM | POA: Diagnosis not present

## 2022-08-26 DIAGNOSIS — M79643 Pain in unspecified hand: Secondary | ICD-10-CM | POA: Diagnosis not present

## 2022-08-28 DIAGNOSIS — L120 Bullous pemphigoid: Secondary | ICD-10-CM | POA: Diagnosis not present

## 2022-08-28 DIAGNOSIS — F13232 Sedative, hypnotic or anxiolytic dependence with withdrawal with perceptual disturbance: Secondary | ICD-10-CM | POA: Diagnosis not present

## 2022-08-28 DIAGNOSIS — R911 Solitary pulmonary nodule: Secondary | ICD-10-CM | POA: Diagnosis not present

## 2022-08-28 DIAGNOSIS — F419 Anxiety disorder, unspecified: Secondary | ICD-10-CM | POA: Diagnosis not present

## 2022-08-28 DIAGNOSIS — F331 Major depressive disorder, recurrent, moderate: Secondary | ICD-10-CM | POA: Diagnosis not present

## 2022-09-01 DIAGNOSIS — I10 Essential (primary) hypertension: Secondary | ICD-10-CM | POA: Diagnosis not present

## 2022-09-02 ENCOUNTER — Ambulatory Visit: Payer: Medicare PPO | Admitting: Obstetrics and Gynecology

## 2022-09-09 DIAGNOSIS — F419 Anxiety disorder, unspecified: Secondary | ICD-10-CM | POA: Diagnosis not present

## 2022-09-09 DIAGNOSIS — Z79899 Other long term (current) drug therapy: Secondary | ICD-10-CM | POA: Diagnosis not present

## 2022-09-09 DIAGNOSIS — Z760 Encounter for issue of repeat prescription: Secondary | ICD-10-CM | POA: Diagnosis not present

## 2022-09-09 DIAGNOSIS — G894 Chronic pain syndrome: Secondary | ICD-10-CM | POA: Diagnosis not present

## 2022-09-09 DIAGNOSIS — F418 Other specified anxiety disorders: Secondary | ICD-10-CM | POA: Diagnosis not present

## 2022-09-09 DIAGNOSIS — I10 Essential (primary) hypertension: Secondary | ICD-10-CM | POA: Diagnosis not present

## 2022-09-15 ENCOUNTER — Ambulatory Visit: Payer: Medicare PPO | Admitting: Urology

## 2022-09-15 DIAGNOSIS — M069 Rheumatoid arthritis, unspecified: Secondary | ICD-10-CM | POA: Diagnosis not present

## 2022-09-15 DIAGNOSIS — I10 Essential (primary) hypertension: Secondary | ICD-10-CM | POA: Diagnosis not present

## 2022-09-15 DIAGNOSIS — N390 Urinary tract infection, site not specified: Secondary | ICD-10-CM

## 2022-09-16 DIAGNOSIS — F329 Major depressive disorder, single episode, unspecified: Secondary | ICD-10-CM | POA: Diagnosis not present

## 2022-09-16 DIAGNOSIS — R0981 Nasal congestion: Secondary | ICD-10-CM | POA: Diagnosis not present

## 2022-09-16 DIAGNOSIS — I10 Essential (primary) hypertension: Secondary | ICD-10-CM | POA: Diagnosis not present

## 2022-09-16 DIAGNOSIS — G894 Chronic pain syndrome: Secondary | ICD-10-CM | POA: Diagnosis not present

## 2022-09-16 DIAGNOSIS — J019 Acute sinusitis, unspecified: Secondary | ICD-10-CM | POA: Diagnosis not present

## 2022-09-26 DIAGNOSIS — F411 Generalized anxiety disorder: Secondary | ICD-10-CM | POA: Diagnosis not present

## 2022-09-26 DIAGNOSIS — F419 Anxiety disorder, unspecified: Secondary | ICD-10-CM | POA: Diagnosis not present

## 2022-09-26 DIAGNOSIS — F331 Major depressive disorder, recurrent, moderate: Secondary | ICD-10-CM | POA: Diagnosis not present

## 2022-09-26 DIAGNOSIS — F13232 Sedative, hypnotic or anxiolytic dependence with withdrawal with perceptual disturbance: Secondary | ICD-10-CM | POA: Diagnosis not present

## 2022-10-02 ENCOUNTER — Encounter: Payer: Medicare PPO | Attending: Registered Nurse | Admitting: Registered Nurse

## 2022-10-02 ENCOUNTER — Encounter: Payer: Self-pay | Admitting: Registered Nurse

## 2022-10-02 VITALS — BP 112/71 | HR 60 | Wt 158.0 lb

## 2022-10-02 DIAGNOSIS — M2550 Pain in unspecified joint: Secondary | ICD-10-CM | POA: Insufficient documentation

## 2022-10-02 DIAGNOSIS — M797 Fibromyalgia: Secondary | ICD-10-CM | POA: Insufficient documentation

## 2022-10-02 DIAGNOSIS — G894 Chronic pain syndrome: Secondary | ICD-10-CM | POA: Diagnosis not present

## 2022-10-02 DIAGNOSIS — Z79899 Other long term (current) drug therapy: Secondary | ICD-10-CM | POA: Diagnosis not present

## 2022-10-02 DIAGNOSIS — M48061 Spinal stenosis, lumbar region without neurogenic claudication: Secondary | ICD-10-CM | POA: Diagnosis not present

## 2022-10-02 DIAGNOSIS — Z5181 Encounter for therapeutic drug level monitoring: Secondary | ICD-10-CM | POA: Diagnosis not present

## 2022-10-02 NOTE — Progress Notes (Signed)
Subjective:    Patient ID: Robin Blackburn, female    DOB: [birth year], 77 y.o.   MRN: [id number]  HPI: Robin Blackburn is a 77 y.o. female who returns for follow up appointment for chronic pain and medication refill. She states her pain is located in her lower back, she also reports increase intensity and frequency of neuropathic pain.We will order a BMP to be drawn at the nursing home, awaiting results prior to increasing gabapentin. She verbalizes understanding.   She rates her pain 8. Her current exercise regime is walking and performing stretching exercises.  Pain Inventory Average Pain 8 Pain Right Now 8 My pain is sharp, burning, dull, stabbing, and tingling  In the last 24 hours, has pain interfered with the following? General activity 9 Relation with others 8 Enjoyment of life 9 What TIME of day is your pain at its worst? daytime and night Sleep (in general) NA  Pain is worse with: walking, standing, and some activites Pain improves with: rest, therapy/exercise, and medication Relief from Meds: 6  Family History  Problem Relation Age of Onset   Hypertension Father    Transient ischemic attack Father    Hypertension Brother    Leukemia Brother    CVA Maternal Grandfather    Social History   Socioeconomic History   Marital status: Divorced    Spouse name: Not on file   Number of children: 0   Years of education: 12   Highest education level: 12th grade  Occupational History   Not on file  Tobacco Use   Smoking status: Never    Passive exposure: Never   Smokeless tobacco: Never  Vaping Use   Vaping Use: Never used  Substance and Sexual Activity   Alcohol use: No   Drug use: No   Sexual activity: Not Currently  Other Topics Concern   Not on file  Social History Narrative   Not on file   Social Determinants of Health   Financial Resource Strain: Low Risk  (01/06/2022)   Overall Financial Resource Strain (CARDIA)    Difficulty of Paying Living Expenses: Not  hard at all  Food Insecurity: No Food Insecurity (01/06/2022)   Hunger Vital Sign    Worried About Running Out of Food in the Last Year: Never true    Ran Out of Food in the Last Year: Never true  Transportation Needs: No Transportation Needs (01/06/2022)   PRAPARE - Administrator, Civil Service (Medical): No    Lack of Transportation (Non-Medical): No  Physical Activity: Inactive (01/06/2022)   Exercise Vital Sign    Days of Exercise per Week: 0 days    Minutes of Exercise per Session: 0 min  Stress: No Stress Concern Present (01/06/2022)   [employer] of Occupational Health - Occupational Stress Questionnaire    Feeling of Stress : Not at all  Social Connections: Moderately Isolated (01/06/2022)   Social Connection and Isolation Panel [NHANES]    Frequency of Communication with Friends and Family: More than three times a week    Frequency of Social Gatherings with Friends and Family: More than three times a week    Attends Religious Services: More than 4 times per year    Active Member of [employer] or Organizations: No    Attends Banker Meetings: Never    Marital Status: Divorced   Past Surgical History:  Procedure Laterality Date   ABDOMINAL HYSTERECTOMY     ANKLE RECONSTRUCTION  APPENDECTOMY     BACK SURGERY     CHOLECYSTECTOMY     KNEE SURGERY     Past Surgical History:  Procedure Laterality Date   ABDOMINAL HYSTERECTOMY     ANKLE RECONSTRUCTION     APPENDECTOMY     BACK SURGERY     CHOLECYSTECTOMY     KNEE SURGERY     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  Opioid Risk Score:   Fall Risk Score:  `1  Depression screen PHQ 2/9     10/02/2022    1:02 PM 07/03/2022    1:01 PM 03/25/2022     1:54 PM 01/06/2022    9:33 AM 11/22/2021    1:26 PM  Depression screen PHQ 2/9  Decreased Interest 0 0 1 0 0  Down, Depressed, Hopeless 0 0 1 1 1   PHQ - 2 Score 0 0 2 1 1   Altered sleeping     0  Tired, decreased energy     0  Change in appetite     0  Feeling bad or failure about yourself      0  Trouble concentrating     0  Moving slowly or fidgety/restless     0  PHQ-9 Score     1     Review of Systems  Constitutional: Negative.   HENT: Negative.    Eyes: Negative.   Respiratory: Negative.    Cardiovascular: Negative.   Gastrointestinal: Negative.   Endocrine: Negative.   Genitourinary: Negative.   Musculoskeletal:  Positive for back pain and neck pain.  Skin: Negative.   Allergic/Immunologic: Negative.   Neurological: Negative.   Hematological: Negative.   Psychiatric/Behavioral: Negative.    All other systems reviewed and are negative.      Objective:   Physical Exam Vitals and nursing note reviewed.  Constitutional:      Appearance: Normal appearance.  Cardiovascular:     Rate and Rhythm: Normal rate and regular rhythm.     Pulses: Normal pulses.     Heart sounds: Normal heart sounds.  Pulmonary:     Effort: Pulmonary effort is normal.     Breath sounds: Normal breath sounds.  Musculoskeletal:     Cervical back: Normal range of motion and neck supple.     Comments: Normal Muscle Bulk and Muscle Testing Reveals:  Upper Extremities: Full ROM and Muscle Strength 5/5 Lumbar Paraspinal Tenderness: L-3-L-5 Lower Extremities: Full ROM and Muscle Strength 5/5 Arrived in wheelchair     Skin:    General: Skin is warm and dry.  Neurological:     Mental Status: She is alert and oriented to person, place, and time.  Psychiatric:        Mood and Affect: Mood normal.        Behavior: Behavior normal.         Assessment & Plan:  Lumbar Spinal Stenosis: Continue HEP as Tolerated. Continue to Monitor. 10/02/2022 Fibromyalgia: Continue  Gabapentin 300 mg one  capsule every 8 hours., awaiting BMP results prior to increasing gabapentin, she verbalizes understanding.  Continue Physical Therapy at [facility] with Physical Therapist.  Chronic Pain Syndrome: Continue : Lidocaine  Patches and Refill: Tramadol 50 mg one tablet every 8 hours as needed fpr pain #90.  We will continue the opioid monitoring program, this consists of regular clinic visits, examinations, urine drug screen, pill counts as well as use of [place] Controlled Substance Reporting system. A 12 month History has been reviewed on the [place] Controlled Substance Reporting System on 10/02/2022.  4. Polyarthralgia: Continue HEP as tolerated. Continue to Monitor. 10/02/2022   F/U in 6 Months

## 2022-10-03 DIAGNOSIS — I10 Essential (primary) hypertension: Secondary | ICD-10-CM | POA: Diagnosis not present

## 2022-10-06 DIAGNOSIS — M069 Rheumatoid arthritis, unspecified: Secondary | ICD-10-CM | POA: Diagnosis not present

## 2022-10-06 DIAGNOSIS — I10 Essential (primary) hypertension: Secondary | ICD-10-CM | POA: Diagnosis not present

## 2022-10-06 DIAGNOSIS — M797 Fibromyalgia: Secondary | ICD-10-CM | POA: Diagnosis not present

## 2022-10-07 DIAGNOSIS — F419 Anxiety disorder, unspecified: Secondary | ICD-10-CM | POA: Diagnosis not present

## 2022-10-07 DIAGNOSIS — G894 Chronic pain syndrome: Secondary | ICD-10-CM | POA: Diagnosis not present

## 2022-10-07 DIAGNOSIS — I10 Essential (primary) hypertension: Secondary | ICD-10-CM | POA: Diagnosis not present

## 2022-10-07 DIAGNOSIS — Z79899 Other long term (current) drug therapy: Secondary | ICD-10-CM | POA: Diagnosis not present

## 2022-10-07 DIAGNOSIS — Z760 Encounter for issue of repeat prescription: Secondary | ICD-10-CM | POA: Diagnosis not present

## 2022-10-07 DIAGNOSIS — K219 Gastro-esophageal reflux disease without esophagitis: Secondary | ICD-10-CM | POA: Diagnosis not present

## 2022-10-08 ENCOUNTER — Telehealth: Payer: Self-pay

## 2022-10-08 NOTE — Telephone Encounter (Signed)
CMP faxed on 10/06/22. Would yo like to increase Gabapentin 300 mg to  4 times day after reviewing CMP? Fax order [name] [phone]

## 2022-10-09 DIAGNOSIS — M722 Plantar fascial fibromatosis: Secondary | ICD-10-CM | POA: Diagnosis not present

## 2022-10-09 DIAGNOSIS — M069 Rheumatoid arthritis, unspecified: Secondary | ICD-10-CM | POA: Diagnosis not present

## 2022-10-15 DIAGNOSIS — R600 Localized edema: Secondary | ICD-10-CM | POA: Diagnosis not present

## 2022-10-15 DIAGNOSIS — M79675 Pain in left toe(s): Secondary | ICD-10-CM | POA: Diagnosis not present

## 2022-10-15 DIAGNOSIS — M79674 Pain in right toe(s): Secondary | ICD-10-CM | POA: Diagnosis not present

## 2022-10-15 DIAGNOSIS — B351 Tinea unguium: Secondary | ICD-10-CM | POA: Diagnosis not present

## 2022-10-16 NOTE — Telephone Encounter (Signed)
Call Placed to [facility], left message , awaiting a return call  Ms. Salsbury Creatine Clearance = 57.9  Maximum Gabapentin dose should be 700 mg BID = 1400 mg  She is currently on Gabapentin 300 mg three times a day.   Awaiting on return call.

## 2022-10-17 ENCOUNTER — Telehealth: Payer: Self-pay | Admitting: *Deleted

## 2022-10-17 NOTE — Telephone Encounter (Signed)
Return [name] call,  Gabapentin will be increased to 4 times a day.  She verbalizes understanding.

## 2022-10-17 NOTE — Telephone Encounter (Signed)
Robin Blackburn there was a message that you were awaiting call back in reference to Gabapentin refill. [name] called this morning [phone].

## 2022-10-21 ENCOUNTER — Telehealth: Payer: Self-pay | Admitting: *Deleted

## 2022-10-21 DIAGNOSIS — G2581 Restless legs syndrome: Secondary | ICD-10-CM | POA: Diagnosis not present

## 2022-10-21 DIAGNOSIS — G4700 Insomnia, unspecified: Secondary | ICD-10-CM | POA: Diagnosis not present

## 2022-10-21 DIAGNOSIS — M25572 Pain in left ankle and joints of left foot: Secondary | ICD-10-CM | POA: Diagnosis not present

## 2022-10-21 DIAGNOSIS — M79672 Pain in left foot: Secondary | ICD-10-CM | POA: Diagnosis not present

## 2022-10-21 NOTE — Telephone Encounter (Signed)
[name] @ [address] calling on behalf of patient: Patient is requesting to change dosing on Gabapentin she is complaining of dizziness. She would like to go back to [facility]. Please advise [phone].

## 2022-10-21 NOTE — Telephone Encounter (Signed)
Spoke with [name] from [place]. Order given:  Gabapentin decreased to three times a day.  Ms. Robin Blackburn as about muscle relaxer she is currently on Robaxin, we will continue her Robaxin.  [name], verbalizes understanding.

## 2022-10-22 DIAGNOSIS — F13232 Sedative, hypnotic or anxiolytic dependence with withdrawal with perceptual disturbance: Secondary | ICD-10-CM | POA: Diagnosis not present

## 2022-10-22 DIAGNOSIS — M069 Rheumatoid arthritis, unspecified: Secondary | ICD-10-CM | POA: Diagnosis not present

## 2022-10-22 DIAGNOSIS — F331 Major depressive disorder, recurrent, moderate: Secondary | ICD-10-CM | POA: Diagnosis not present

## 2022-10-22 DIAGNOSIS — F419 Anxiety disorder, unspecified: Secondary | ICD-10-CM | POA: Diagnosis not present

## 2022-10-22 DIAGNOSIS — R600 Localized edema: Secondary | ICD-10-CM | POA: Diagnosis not present

## 2022-10-22 DIAGNOSIS — I10 Essential (primary) hypertension: Secondary | ICD-10-CM | POA: Diagnosis not present

## 2022-10-22 NOTE — Telephone Encounter (Signed)
Addressed by Jeanann Lewandowsky.

## 2022-10-24 DIAGNOSIS — M25572 Pain in left ankle and joints of left foot: Secondary | ICD-10-CM | POA: Diagnosis not present

## 2022-10-27 DIAGNOSIS — R21 Rash and other nonspecific skin eruption: Secondary | ICD-10-CM | POA: Diagnosis not present

## 2022-10-27 DIAGNOSIS — E876 Hypokalemia: Secondary | ICD-10-CM | POA: Diagnosis not present

## 2022-10-29 DIAGNOSIS — F411 Generalized anxiety disorder: Secondary | ICD-10-CM | POA: Diagnosis not present

## 2022-10-31 DIAGNOSIS — I509 Heart failure, unspecified: Secondary | ICD-10-CM | POA: Diagnosis not present

## 2022-10-31 DIAGNOSIS — R799 Abnormal finding of blood chemistry, unspecified: Secondary | ICD-10-CM | POA: Diagnosis not present

## 2022-11-04 DIAGNOSIS — R21 Rash and other nonspecific skin eruption: Secondary | ICD-10-CM | POA: Diagnosis not present

## 2022-11-04 DIAGNOSIS — R300 Dysuria: Secondary | ICD-10-CM | POA: Diagnosis not present

## 2022-11-04 DIAGNOSIS — E569 Vitamin deficiency, unspecified: Secondary | ICD-10-CM | POA: Diagnosis not present

## 2022-11-04 DIAGNOSIS — K219 Gastro-esophageal reflux disease without esophagitis: Secondary | ICD-10-CM | POA: Diagnosis not present

## 2022-11-04 DIAGNOSIS — F419 Anxiety disorder, unspecified: Secondary | ICD-10-CM | POA: Diagnosis not present

## 2022-11-05 DIAGNOSIS — N390 Urinary tract infection, site not specified: Secondary | ICD-10-CM | POA: Diagnosis not present

## 2022-11-06 DIAGNOSIS — R569 Unspecified convulsions: Secondary | ICD-10-CM | POA: Diagnosis not present

## 2022-11-06 DIAGNOSIS — N390 Urinary tract infection, site not specified: Secondary | ICD-10-CM | POA: Diagnosis not present

## 2022-11-06 DIAGNOSIS — R0902 Hypoxemia: Secondary | ICD-10-CM | POA: Diagnosis not present

## 2022-11-06 DIAGNOSIS — G894 Chronic pain syndrome: Secondary | ICD-10-CM | POA: Diagnosis not present

## 2022-11-06 DIAGNOSIS — M069 Rheumatoid arthritis, unspecified: Secondary | ICD-10-CM | POA: Diagnosis not present

## 2022-11-06 DIAGNOSIS — M797 Fibromyalgia: Secondary | ICD-10-CM | POA: Diagnosis not present

## 2022-11-06 DIAGNOSIS — E785 Hyperlipidemia, unspecified: Secondary | ICD-10-CM | POA: Diagnosis not present

## 2022-11-06 DIAGNOSIS — Z8673 Personal history of transient ischemic attack (TIA), and cerebral infarction without residual deficits: Secondary | ICD-10-CM | POA: Diagnosis not present

## 2022-11-06 DIAGNOSIS — K219 Gastro-esophageal reflux disease without esophagitis: Secondary | ICD-10-CM | POA: Diagnosis not present

## 2022-11-06 DIAGNOSIS — I6522 Occlusion and stenosis of left carotid artery: Secondary | ICD-10-CM | POA: Diagnosis not present

## 2022-11-06 DIAGNOSIS — I272 Pulmonary hypertension, unspecified: Secondary | ICD-10-CM | POA: Diagnosis not present

## 2022-11-06 DIAGNOSIS — I083 Combined rheumatic disorders of mitral, aortic and tricuspid valves: Secondary | ICD-10-CM | POA: Diagnosis not present

## 2022-11-06 DIAGNOSIS — I639 Cerebral infarction, unspecified: Secondary | ICD-10-CM | POA: Diagnosis not present

## 2022-11-06 DIAGNOSIS — F329 Major depressive disorder, single episode, unspecified: Secondary | ICD-10-CM | POA: Diagnosis not present

## 2022-11-06 DIAGNOSIS — Z7401 Bed confinement status: Secondary | ICD-10-CM | POA: Diagnosis not present

## 2022-11-06 DIAGNOSIS — R2990 Unspecified symptoms and signs involving the nervous system: Secondary | ICD-10-CM | POA: Diagnosis not present

## 2022-11-06 DIAGNOSIS — I10 Essential (primary) hypertension: Secondary | ICD-10-CM | POA: Diagnosis not present

## 2022-11-06 DIAGNOSIS — Z743 Need for continuous supervision: Secondary | ICD-10-CM | POA: Diagnosis not present

## 2022-11-06 DIAGNOSIS — R54 Age-related physical debility: Secondary | ICD-10-CM | POA: Diagnosis not present

## 2022-11-06 DIAGNOSIS — F419 Anxiety disorder, unspecified: Secondary | ICD-10-CM | POA: Diagnosis not present

## 2022-11-06 DIAGNOSIS — R6889 Other general symptoms and signs: Secondary | ICD-10-CM | POA: Diagnosis not present

## 2022-11-06 DIAGNOSIS — G8191 Hemiplegia, unspecified affecting right dominant side: Secondary | ICD-10-CM | POA: Diagnosis not present

## 2022-11-06 DIAGNOSIS — M1990 Unspecified osteoarthritis, unspecified site: Secondary | ICD-10-CM | POA: Diagnosis not present

## 2022-11-06 DIAGNOSIS — R29818 Other symptoms and signs involving the nervous system: Secondary | ICD-10-CM | POA: Diagnosis not present

## 2022-11-06 DIAGNOSIS — R000 Tachycardia, unspecified: Secondary | ICD-10-CM | POA: Diagnosis not present

## 2022-11-06 DIAGNOSIS — I679 Cerebrovascular disease, unspecified: Secondary | ICD-10-CM | POA: Diagnosis not present

## 2022-11-06 DIAGNOSIS — R918 Other nonspecific abnormal finding of lung field: Secondary | ICD-10-CM | POA: Diagnosis not present

## 2022-11-06 DIAGNOSIS — R5381 Other malaise: Secondary | ICD-10-CM | POA: Diagnosis not present

## 2022-11-06 DIAGNOSIS — J90 Pleural effusion, not elsewhere classified: Secondary | ICD-10-CM | POA: Diagnosis not present

## 2022-11-06 DIAGNOSIS — R531 Weakness: Secondary | ICD-10-CM | POA: Diagnosis not present

## 2022-11-06 DIAGNOSIS — G8929 Other chronic pain: Secondary | ICD-10-CM | POA: Diagnosis not present

## 2022-11-07 DIAGNOSIS — R5381 Other malaise: Secondary | ICD-10-CM | POA: Diagnosis not present

## 2022-11-07 DIAGNOSIS — N390 Urinary tract infection, site not specified: Secondary | ICD-10-CM | POA: Diagnosis not present

## 2022-11-07 DIAGNOSIS — Z8673 Personal history of transient ischemic attack (TIA), and cerebral infarction without residual deficits: Secondary | ICD-10-CM | POA: Diagnosis not present

## 2022-11-07 DIAGNOSIS — G894 Chronic pain syndrome: Secondary | ICD-10-CM | POA: Diagnosis not present

## 2022-11-07 DIAGNOSIS — Z7401 Bed confinement status: Secondary | ICD-10-CM | POA: Diagnosis not present

## 2022-11-07 DIAGNOSIS — K219 Gastro-esophageal reflux disease without esophagitis: Secondary | ICD-10-CM | POA: Diagnosis not present

## 2022-11-07 DIAGNOSIS — I083 Combined rheumatic disorders of mitral, aortic and tricuspid valves: Secondary | ICD-10-CM | POA: Diagnosis not present

## 2022-11-07 DIAGNOSIS — M069 Rheumatoid arthritis, unspecified: Secondary | ICD-10-CM | POA: Diagnosis not present

## 2022-11-07 DIAGNOSIS — I272 Pulmonary hypertension, unspecified: Secondary | ICD-10-CM | POA: Diagnosis not present

## 2022-11-07 DIAGNOSIS — R2990 Unspecified symptoms and signs involving the nervous system: Secondary | ICD-10-CM | POA: Diagnosis not present

## 2022-11-07 DIAGNOSIS — I10 Essential (primary) hypertension: Secondary | ICD-10-CM | POA: Diagnosis not present

## 2022-11-08 DIAGNOSIS — K219 Gastro-esophageal reflux disease without esophagitis: Secondary | ICD-10-CM | POA: Diagnosis not present

## 2022-11-08 DIAGNOSIS — Z7401 Bed confinement status: Secondary | ICD-10-CM | POA: Diagnosis not present

## 2022-11-08 DIAGNOSIS — R5381 Other malaise: Secondary | ICD-10-CM | POA: Diagnosis not present

## 2022-11-08 DIAGNOSIS — G894 Chronic pain syndrome: Secondary | ICD-10-CM | POA: Diagnosis not present

## 2022-11-08 DIAGNOSIS — J90 Pleural effusion, not elsewhere classified: Secondary | ICD-10-CM | POA: Diagnosis not present

## 2022-11-08 DIAGNOSIS — R918 Other nonspecific abnormal finding of lung field: Secondary | ICD-10-CM | POA: Diagnosis not present

## 2022-11-08 DIAGNOSIS — N390 Urinary tract infection, site not specified: Secondary | ICD-10-CM | POA: Diagnosis not present

## 2022-11-08 DIAGNOSIS — Z8673 Personal history of transient ischemic attack (TIA), and cerebral infarction without residual deficits: Secondary | ICD-10-CM | POA: Diagnosis not present

## 2022-11-08 DIAGNOSIS — R2990 Unspecified symptoms and signs involving the nervous system: Secondary | ICD-10-CM | POA: Diagnosis not present

## 2022-11-08 DIAGNOSIS — M069 Rheumatoid arthritis, unspecified: Secondary | ICD-10-CM | POA: Diagnosis not present

## 2022-11-08 DIAGNOSIS — I10 Essential (primary) hypertension: Secondary | ICD-10-CM | POA: Diagnosis not present

## 2022-11-09 DIAGNOSIS — E785 Hyperlipidemia, unspecified: Secondary | ICD-10-CM | POA: Diagnosis not present

## 2022-11-09 DIAGNOSIS — R6889 Other general symptoms and signs: Secondary | ICD-10-CM | POA: Diagnosis not present

## 2022-11-09 DIAGNOSIS — N390 Urinary tract infection, site not specified: Secondary | ICD-10-CM | POA: Diagnosis not present

## 2022-11-09 DIAGNOSIS — M069 Rheumatoid arthritis, unspecified: Secondary | ICD-10-CM | POA: Diagnosis not present

## 2022-11-09 DIAGNOSIS — G8929 Other chronic pain: Secondary | ICD-10-CM | POA: Diagnosis not present

## 2022-11-09 DIAGNOSIS — I10 Essential (primary) hypertension: Secondary | ICD-10-CM | POA: Diagnosis not present

## 2022-11-09 DIAGNOSIS — R569 Unspecified convulsions: Secondary | ICD-10-CM | POA: Diagnosis not present

## 2022-11-09 DIAGNOSIS — R0902 Hypoxemia: Secondary | ICD-10-CM | POA: Diagnosis not present

## 2022-11-09 DIAGNOSIS — R54 Age-related physical debility: Secondary | ICD-10-CM | POA: Diagnosis not present

## 2022-11-09 DIAGNOSIS — F419 Anxiety disorder, unspecified: Secondary | ICD-10-CM | POA: Diagnosis not present

## 2022-11-09 DIAGNOSIS — K219 Gastro-esophageal reflux disease without esophagitis: Secondary | ICD-10-CM | POA: Diagnosis not present

## 2022-11-09 DIAGNOSIS — Z743 Need for continuous supervision: Secondary | ICD-10-CM | POA: Diagnosis not present

## 2022-11-10 DIAGNOSIS — I10 Essential (primary) hypertension: Secondary | ICD-10-CM | POA: Diagnosis not present

## 2022-11-10 DIAGNOSIS — R2990 Unspecified symptoms and signs involving the nervous system: Secondary | ICD-10-CM | POA: Diagnosis not present

## 2022-11-10 DIAGNOSIS — N390 Urinary tract infection, site not specified: Secondary | ICD-10-CM | POA: Diagnosis not present

## 2022-11-11 DIAGNOSIS — E569 Vitamin deficiency, unspecified: Secondary | ICD-10-CM | POA: Diagnosis not present

## 2022-11-11 DIAGNOSIS — D649 Anemia, unspecified: Secondary | ICD-10-CM | POA: Diagnosis not present

## 2022-11-11 DIAGNOSIS — G894 Chronic pain syndrome: Secondary | ICD-10-CM | POA: Diagnosis not present

## 2022-11-11 DIAGNOSIS — K219 Gastro-esophageal reflux disease without esophagitis: Secondary | ICD-10-CM | POA: Diagnosis not present

## 2022-11-12 ENCOUNTER — Other Ambulatory Visit: Payer: Self-pay | Admitting: Physician Assistant

## 2022-11-12 ENCOUNTER — Institutional Professional Consult (permissible substitution): Payer: Medicare PPO | Admitting: Pulmonary Disease

## 2022-11-12 DIAGNOSIS — I639 Cerebral infarction, unspecified: Secondary | ICD-10-CM

## 2022-11-12 DIAGNOSIS — Z8673 Personal history of transient ischemic attack (TIA), and cerebral infarction without residual deficits: Secondary | ICD-10-CM

## 2022-11-12 DIAGNOSIS — I10 Essential (primary) hypertension: Secondary | ICD-10-CM

## 2022-11-14 DIAGNOSIS — M069 Rheumatoid arthritis, unspecified: Secondary | ICD-10-CM | POA: Diagnosis not present

## 2022-11-14 DIAGNOSIS — F331 Major depressive disorder, recurrent, moderate: Secondary | ICD-10-CM | POA: Diagnosis not present

## 2022-11-14 DIAGNOSIS — F419 Anxiety disorder, unspecified: Secondary | ICD-10-CM | POA: Diagnosis not present

## 2022-11-14 DIAGNOSIS — N390 Urinary tract infection, site not specified: Secondary | ICD-10-CM | POA: Diagnosis not present

## 2022-11-14 DIAGNOSIS — I10 Essential (primary) hypertension: Secondary | ICD-10-CM | POA: Diagnosis not present

## 2022-11-14 DIAGNOSIS — F411 Generalized anxiety disorder: Secondary | ICD-10-CM | POA: Diagnosis not present

## 2022-11-18 ENCOUNTER — Ambulatory Visit: Payer: Medicare PPO | Admitting: Urology

## 2022-11-19 ENCOUNTER — Institutional Professional Consult (permissible substitution): Payer: Medicare PPO | Admitting: Emergency Medicine

## 2022-11-20 DIAGNOSIS — R2990 Unspecified symptoms and signs involving the nervous system: Secondary | ICD-10-CM | POA: Diagnosis not present

## 2022-11-20 DIAGNOSIS — I10 Essential (primary) hypertension: Secondary | ICD-10-CM | POA: Diagnosis not present

## 2022-11-20 DIAGNOSIS — M069 Rheumatoid arthritis, unspecified: Secondary | ICD-10-CM | POA: Diagnosis not present

## 2022-11-20 DIAGNOSIS — R2681 Unsteadiness on feet: Secondary | ICD-10-CM | POA: Diagnosis not present

## 2022-11-20 DIAGNOSIS — N390 Urinary tract infection, site not specified: Secondary | ICD-10-CM | POA: Diagnosis not present

## 2022-11-20 DIAGNOSIS — F331 Major depressive disorder, recurrent, moderate: Secondary | ICD-10-CM | POA: Diagnosis not present

## 2022-11-20 DIAGNOSIS — M6281 Muscle weakness (generalized): Secondary | ICD-10-CM | POA: Diagnosis not present

## 2022-11-20 DIAGNOSIS — M1711 Unilateral primary osteoarthritis, right knee: Secondary | ICD-10-CM | POA: Diagnosis not present

## 2022-11-20 DIAGNOSIS — G629 Polyneuropathy, unspecified: Secondary | ICD-10-CM | POA: Diagnosis not present

## 2022-11-20 DIAGNOSIS — G894 Chronic pain syndrome: Secondary | ICD-10-CM | POA: Diagnosis not present

## 2022-11-21 DIAGNOSIS — R2681 Unsteadiness on feet: Secondary | ICD-10-CM | POA: Diagnosis not present

## 2022-11-21 DIAGNOSIS — M069 Rheumatoid arthritis, unspecified: Secondary | ICD-10-CM | POA: Diagnosis not present

## 2022-11-21 DIAGNOSIS — M6281 Muscle weakness (generalized): Secondary | ICD-10-CM | POA: Diagnosis not present

## 2022-11-21 DIAGNOSIS — M1711 Unilateral primary osteoarthritis, right knee: Secondary | ICD-10-CM | POA: Diagnosis not present

## 2022-11-21 DIAGNOSIS — F331 Major depressive disorder, recurrent, moderate: Secondary | ICD-10-CM | POA: Diagnosis not present

## 2022-11-25 DIAGNOSIS — R2681 Unsteadiness on feet: Secondary | ICD-10-CM | POA: Diagnosis not present

## 2022-11-25 DIAGNOSIS — M069 Rheumatoid arthritis, unspecified: Secondary | ICD-10-CM | POA: Diagnosis not present

## 2022-11-25 DIAGNOSIS — M1711 Unilateral primary osteoarthritis, right knee: Secondary | ICD-10-CM | POA: Diagnosis not present

## 2022-11-25 DIAGNOSIS — F331 Major depressive disorder, recurrent, moderate: Secondary | ICD-10-CM | POA: Diagnosis not present

## 2022-11-25 DIAGNOSIS — M6281 Muscle weakness (generalized): Secondary | ICD-10-CM | POA: Diagnosis not present

## 2022-11-26 DIAGNOSIS — F331 Major depressive disorder, recurrent, moderate: Secondary | ICD-10-CM | POA: Diagnosis not present

## 2022-11-26 DIAGNOSIS — M6281 Muscle weakness (generalized): Secondary | ICD-10-CM | POA: Diagnosis not present

## 2022-11-26 DIAGNOSIS — R2681 Unsteadiness on feet: Secondary | ICD-10-CM | POA: Diagnosis not present

## 2022-11-26 DIAGNOSIS — M1711 Unilateral primary osteoarthritis, right knee: Secondary | ICD-10-CM | POA: Diagnosis not present

## 2022-11-26 DIAGNOSIS — M069 Rheumatoid arthritis, unspecified: Secondary | ICD-10-CM | POA: Diagnosis not present

## 2022-11-27 DIAGNOSIS — M6281 Muscle weakness (generalized): Secondary | ICD-10-CM | POA: Diagnosis not present

## 2022-11-27 DIAGNOSIS — R2681 Unsteadiness on feet: Secondary | ICD-10-CM | POA: Diagnosis not present

## 2022-11-27 DIAGNOSIS — M1711 Unilateral primary osteoarthritis, right knee: Secondary | ICD-10-CM | POA: Diagnosis not present

## 2022-11-27 DIAGNOSIS — M069 Rheumatoid arthritis, unspecified: Secondary | ICD-10-CM | POA: Diagnosis not present

## 2022-11-27 DIAGNOSIS — F331 Major depressive disorder, recurrent, moderate: Secondary | ICD-10-CM | POA: Diagnosis not present

## 2022-11-28 DIAGNOSIS — M069 Rheumatoid arthritis, unspecified: Secondary | ICD-10-CM | POA: Diagnosis not present

## 2022-11-28 DIAGNOSIS — F331 Major depressive disorder, recurrent, moderate: Secondary | ICD-10-CM | POA: Diagnosis not present

## 2022-11-28 DIAGNOSIS — M1711 Unilateral primary osteoarthritis, right knee: Secondary | ICD-10-CM | POA: Diagnosis not present

## 2022-11-28 DIAGNOSIS — M6281 Muscle weakness (generalized): Secondary | ICD-10-CM | POA: Diagnosis not present

## 2022-11-28 DIAGNOSIS — R2681 Unsteadiness on feet: Secondary | ICD-10-CM | POA: Diagnosis not present

## 2022-12-01 DIAGNOSIS — R2681 Unsteadiness on feet: Secondary | ICD-10-CM | POA: Diagnosis not present

## 2022-12-01 DIAGNOSIS — M069 Rheumatoid arthritis, unspecified: Secondary | ICD-10-CM | POA: Diagnosis not present

## 2022-12-01 DIAGNOSIS — M6281 Muscle weakness (generalized): Secondary | ICD-10-CM | POA: Diagnosis not present

## 2022-12-01 DIAGNOSIS — F331 Major depressive disorder, recurrent, moderate: Secondary | ICD-10-CM | POA: Diagnosis not present

## 2022-12-01 DIAGNOSIS — M1711 Unilateral primary osteoarthritis, right knee: Secondary | ICD-10-CM | POA: Diagnosis not present

## 2022-12-02 DIAGNOSIS — I10 Essential (primary) hypertension: Secondary | ICD-10-CM | POA: Diagnosis not present

## 2022-12-02 DIAGNOSIS — R2681 Unsteadiness on feet: Secondary | ICD-10-CM | POA: Diagnosis not present

## 2022-12-02 DIAGNOSIS — F331 Major depressive disorder, recurrent, moderate: Secondary | ICD-10-CM | POA: Diagnosis not present

## 2022-12-02 DIAGNOSIS — N390 Urinary tract infection, site not specified: Secondary | ICD-10-CM | POA: Diagnosis not present

## 2022-12-02 DIAGNOSIS — M069 Rheumatoid arthritis, unspecified: Secondary | ICD-10-CM | POA: Diagnosis not present

## 2022-12-02 DIAGNOSIS — M6281 Muscle weakness (generalized): Secondary | ICD-10-CM | POA: Diagnosis not present

## 2022-12-02 DIAGNOSIS — F419 Anxiety disorder, unspecified: Secondary | ICD-10-CM | POA: Diagnosis not present

## 2022-12-02 DIAGNOSIS — R059 Cough, unspecified: Secondary | ICD-10-CM | POA: Diagnosis not present

## 2022-12-02 DIAGNOSIS — M1711 Unilateral primary osteoarthritis, right knee: Secondary | ICD-10-CM | POA: Diagnosis not present

## 2022-12-02 DIAGNOSIS — K219 Gastro-esophageal reflux disease without esophagitis: Secondary | ICD-10-CM | POA: Diagnosis not present

## 2022-12-03 DIAGNOSIS — M6281 Muscle weakness (generalized): Secondary | ICD-10-CM | POA: Diagnosis not present

## 2022-12-03 DIAGNOSIS — F331 Major depressive disorder, recurrent, moderate: Secondary | ICD-10-CM | POA: Diagnosis not present

## 2022-12-03 DIAGNOSIS — J811 Chronic pulmonary edema: Secondary | ICD-10-CM | POA: Diagnosis not present

## 2022-12-03 DIAGNOSIS — R2681 Unsteadiness on feet: Secondary | ICD-10-CM | POA: Diagnosis not present

## 2022-12-03 DIAGNOSIS — M1711 Unilateral primary osteoarthritis, right knee: Secondary | ICD-10-CM | POA: Diagnosis not present

## 2022-12-03 DIAGNOSIS — M069 Rheumatoid arthritis, unspecified: Secondary | ICD-10-CM | POA: Diagnosis not present

## 2022-12-05 ENCOUNTER — Ambulatory Visit (INDEPENDENT_AMBULATORY_CARE_PROVIDER_SITE_OTHER): Payer: Medicare PPO | Admitting: Urology

## 2022-12-05 ENCOUNTER — Encounter: Payer: Self-pay | Admitting: Urology

## 2022-12-05 VITALS — BP 96/65 | HR 90 | Temp 97.8°F

## 2022-12-05 DIAGNOSIS — D849 Immunodeficiency, unspecified: Secondary | ICD-10-CM

## 2022-12-05 DIAGNOSIS — M069 Rheumatoid arthritis, unspecified: Secondary | ICD-10-CM

## 2022-12-05 DIAGNOSIS — R350 Frequency of micturition: Secondary | ICD-10-CM | POA: Diagnosis not present

## 2022-12-05 DIAGNOSIS — Z09 Encounter for follow-up examination after completed treatment for conditions other than malignant neoplasm: Secondary | ICD-10-CM

## 2022-12-05 DIAGNOSIS — R262 Difficulty in walking, not elsewhere classified: Secondary | ICD-10-CM

## 2022-12-05 DIAGNOSIS — N3941 Urge incontinence: Secondary | ICD-10-CM | POA: Diagnosis not present

## 2022-12-05 DIAGNOSIS — N390 Urinary tract infection, site not specified: Secondary | ICD-10-CM | POA: Diagnosis not present

## 2022-12-05 DIAGNOSIS — N3281 Overactive bladder: Secondary | ICD-10-CM

## 2022-12-05 DIAGNOSIS — N323 Diverticulum of bladder: Secondary | ICD-10-CM | POA: Diagnosis not present

## 2022-12-05 DIAGNOSIS — M797 Fibromyalgia: Secondary | ICD-10-CM | POA: Diagnosis not present

## 2022-12-05 DIAGNOSIS — G894 Chronic pain syndrome: Secondary | ICD-10-CM | POA: Diagnosis not present

## 2022-12-05 DIAGNOSIS — N952 Postmenopausal atrophic vaginitis: Secondary | ICD-10-CM

## 2022-12-05 DIAGNOSIS — G629 Polyneuropathy, unspecified: Secondary | ICD-10-CM

## 2022-12-05 DIAGNOSIS — R3915 Urgency of urination: Secondary | ICD-10-CM

## 2022-12-05 LAB — URINALYSIS, ROUTINE W REFLEX MICROSCOPIC
Bilirubin, UA: NEGATIVE
Ketones, UA: NEGATIVE
Nitrite, UA: POSITIVE — AB
Specific Gravity, UA: 1.025 (ref 1.005–1.030)
Urobilinogen, Ur: 1 mg/dL (ref 0.2–1.0)
pH, UA: 5 (ref 5.0–7.5)

## 2022-12-05 LAB — MICROSCOPIC EXAMINATION
RBC, Urine: >30 /hpf — AB (ref 0–2)
WBC, UA: >30 /hpf — AB (ref 0–5)

## 2022-12-05 LAB — BLADDER SCAN AMB NON-IMAGING

## 2022-12-05 MED ORDER — ESTRADIOL 0.1 MG/GM VA CREA
TOPICAL_CREAM | VAGINAL | 3 refills | Status: DC
Start: 2022-12-05 — End: 2024-01-09

## 2022-12-05 NOTE — Patient Instructions (Signed)
Recommendations regarding UTI prevention / management:  When UTI symptoms occur: Call urology office to request order for urine culture. We recommend waiting for urine culture result prior to use of any antibiotics.  For bladder pain/ burning with urination: Over the counter Pyridium (phenazopyridine) as needed (commonly known under the "AZO" brand). No more than 3 days consecutively at a time due to risk for methemoglobinemia, liver function issues, and bone health damage with long term use of Pyridium.  Routine use for UTI prevention: - Low dose antibiotic daily for UTI prophylaxis. - Topical vaginal estrogen for vaginal atrophy. Adequate fluid intake (>1.5 liters/day) to flush out the urinary tract. - Go to the bathroom to urinate every 4-6 hours while awake to minimize urinary stasis / bacterial overgrowth in the bladder. - Proanthocyanidin (PAC) supplement 36 mg daily; must be soluble (insoluble form of PAC will be ineffective). Recommended brand: Ellura. This is an over-the-counter supplement (often must be found/ purchased online) supplement derived from cranberries with concentrated active component: Proanthocyanidin (PAC) 36 mg daily. Decreases bacterial adherence to bladder lining. Not recommended for patients with interstitial cystitis due to acidity. - D-mannose powder (2 grams daily). This is an over-the-counter supplement which decreases bacterial adherence to bladder lining (it is a sugar that inhibits bacterial adherence to urothelial cells by binding to the pili of enteric bacteria). Take as per manufacturer recommendation. Can be used as an alternative or in addition to the concentrated cranberry supplement. Not recommended for diabetic patients due to sugar content. - Vitamin C supplement to acidify urine to minimize bacterial growth. Not recommended for patients with interstitial cystitis due to acidity. - Probiotic to maintain healthy vaginal microbiome to suppress bacteria at  urethral opening. Brand recommendations: Darrold Junker (includes probiotic & D-mannose ), Feminine Balance (highest concentration of lactobacillus) or Hyperbiotic Pro 15.

## 2022-12-05 NOTE — Progress Notes (Signed)
Robin Blackburn [birth year] [id number]  History of Present Illness: Robin Blackburn is a 77 y.o. female who presents today for follow up visit at [facility]. She is accompanied by Servando Salina, CNA. She denies having a medical power of attorney.  - Past medical history includes cognitive communication deficit / prior stroke. She is a long term resident at [facility]. - GU history: 1. Recurrent UTI. - Risk factors include chronic immunosuppression with Humira for RA, IBS with diarrhea, ambulatory dysfunction, incontinence / soiling, likely vaginal atrophy. 2. OAB with urinary frequency, urgency, and urge incontinence.  - Taking Oxybutynin XL 5 mg daily.  3. Bladder diverticulum (multiple).  - Per CT on 03/12/2022.  Urine culture results in past 12 months: - 12/11/2021: Negative  - 11/06/2022: Positive for >100k Klebsiella pneumoniae  At last visit with Dr. Ronne Binning on 07/04/2022: Treated for acute UTI then started on Nitrofurantoin 50 mg nightly for UTI prophylaxis.  Since last visit: 11/06/2022 - 11/09/2022: Admitted at [facility] for stroke-like symptoms. Was found to have an acute UTI; urine culture positive for >100k Klebsiella pneumoniae. Treated with Aztreonam IV due to her multiple antibiotic allergies; she will complete that on 12/15/2022. Also treated with Pyridium for dysuria per patient request.  Today: She reports some ongoing dysuria, increased urinary urgency, frequency, fatigue, chills, sweats - she states that overall those symptoms do seem to be gradually improving. She reports urge incontinence; wears diapers. Denies gross hematuria, straining to void, or sensations of incomplete emptying. Denies acute flank or abdominal pain. She is having nausea, which she attributes to her current antibiotic treatment.   She reports some midline chest pain. States she had chest x-ray and is awaiting those results.   She states she has an upcoming brain MRI to determine if her  recent hospitalization was due in part to a stroke or not.   Fall Screening: Do you usually have a device to assist in your mobility? Yes    Medications: Current Outpatient Medications  Medication Sig Dispense Refill   acetaminophen (TYLENOL) 500 MG tablet Take 500 mg by mouth every 6 (six) hours as needed for mild pain or moderate pain.     Adalimumab (HUMIRA, 2 PEN,) 40 MG/0.8ML PNKT      ALPRAZolam (XANAX) 0.25 MG tablet 1 tablet     ALPRAZolam (XANAX) 0.5 MG tablet Take 0.5 mg by mouth daily.     amLODipine (NORVASC) 2.5 MG tablet Take 1 tablet by mouth daily.     benzocaine (ORAJEL) 10 % mucosal gel Use as directed 1 application in the mouth or throat as needed for mouth pain.     benzocaine (ORAJEL) 10 % mucosal gel 1 application as needed Mouth/Throat Four times a day     betamethasone dipropionate 0.05 % cream Apply topically.     BRILINTA 60 MG TABS tablet Take 60 mg by mouth daily.     busPIRone (BUSPAR) 5 MG tablet      capsicum (ZOSTRIX) 0.075 % topical cream 1 application as needed Externally Three times a day     cefTRIAXone (ROCEPHIN) 1 g injection Inject 1 g into the muscle daily.     cefUROXime (CEFTIN) 500 MG tablet      cetirizine (ZYRTEC) 10 MG tablet 1 tablet Orally Once a day for 30 day(s)     chlorhexidine (PERIDEX) 0.12 % solution Use as directed 15 mLs in the mouth or throat 2 (two) times daily.     Cholecalciferol 5000 units  TABS Take 1 tablet by mouth daily.      clobetasol (TEMOVATE) 0.05 % external solution      clobetasol cream (TEMOVATE) 0.05 % Apply 1 application topically 2 (two) times daily.     clopidogrel (PLAVIX) 75 MG tablet Take 75 mg by mouth at bedtime.     Cranberry 250 MG TABS Take by mouth.     cycloSPORINE (RESTASIS) 0.05 % ophthalmic emulsion 1 drop into affected eye Ophthalmic Twice a day     desloratadine (CLARINEX) 5 MG tablet Take 5 mg by mouth every morning.      diclofenac Sodium (VOLTAREN) 1 % GEL APPLY 2 GRAMS TO THE AFFECTED  AREA(S) BY TOPICAL ROUTE 4 TIMES PER DAY     dicyclomine (BENTYL) 10 MG capsule      dicyclomine (BENTYL) 20 MG tablet Take 20 mg by mouth every 6 (six) hours. Abdominal cramps     estradiol (ESTRACE) 0.1 MG/GM vaginal cream Discard plastic applicator. Insert a blueberry size amount (approximately 1 gram) of cream on fingertip inside vagina at bedtime every night for 1 week then every other night for long term use. 30 g 3   fluconazole (DIFLUCAN) 150 MG tablet      FLUoxetine (PROZAC) 20 MG capsule      fluticasone (FLONASE) 50 MCG/ACT nasal spray Place 1 spray into both nostrils daily.     folic acid (FOLVITE) 1 MG tablet Take 1 mg by mouth daily.     fosfomycin (MONUROL) 3 g PACK Take by mouth.     furosemide (LASIX) 40 MG tablet      gabapentin (NEURONTIN) 300 MG capsule Take 1 capsule (300 mg total) by mouth 3 (three) times daily. 90 capsule 3   guaiFENesin (MUCINEX) 600 MG 12 hr tablet Take 400 mg by mouth 2 (two) times daily.     guaiFENesin (ROBITUSSIN) 100 MG/5ML liquid 10 ml as needed Orally every 4 hrs     hydrOXYzine (ATARAX) 25 MG tablet Take 25 mg by mouth at bedtime.     hydrOXYzine (VISTARIL) 25 MG capsule Take 25 mg by mouth daily as needed.     ivermectin (STROMECTOL) 3 MG TABS tablet      ketoconazole (NIZORAL) 2 % cream      ketoconazole (NIZORAL) 2 % shampoo      lansoprazole (PREVACID) 30 MG capsule Take 60 mg by mouth 2 (two) times daily.     leflunomide (ARAVA) 10 MG tablet 1 tablet     lidocaine (LIDODERM) 5 % Place 1 patch onto the skin every 12 (twelve) hours. Remove & Discard patch within 12 hours or as directed 30 patch 4   loperamide (IMODIUM A-D) 2 MG tablet Take 2 mg by mouth 4 (four) times daily as needed for diarrhea or loose stools.     Melatonin 2.5 MG CAPS Take by mouth 2 (two) times daily.     melatonin 5 MG TABS 1 tablet in the evening Orally Once a day for 30 day(s)     methocarbamol (ROBAXIN) 750 MG tablet 1 tablet Orally every 4 hrs for 30 day(s)      metoprolol tartrate (LOPRESSOR) 25 MG tablet Take 1 tablet (25 mg total) by mouth 2 (two) times daily. 60 tablet 2   mirtazapine (REMERON) 7.5 MG tablet      Multiple Vitamin (MULTIVITAMIN) tablet Take 1 tablet by mouth daily.     nitrofurantoin (MACRODANTIN) 50 MG capsule Take 1 capsule (50 mg total) by mouth at bedtime. 30  capsule 11   nitrofurantoin, macrocrystal-monohydrate, (MACROBID) 100 MG capsule Take 1 capsule (100 mg total) by mouth every 12 (twelve) hours. 14 capsule 0   nystatin (MYCOSTATIN/NYSTOP) powder Apply topically.     ondansetron (ZOFRAN) 4 MG tablet Take 4 mg by mouth every 6 (six) hours as needed for nausea or vomiting.      ondansetron (ZOFRAN-ODT) 4 MG disintegrating tablet 1 tablet on the tongue and allow to dissolve Orally Once a day for 30 day(s)     PARoxetine (PAXIL) 40 MG tablet      permethrin (ELIMITE) 5 % cream      phenazopyridine (PYRIDIUM) 100 MG tablet Take 1 tablet (100 mg total) by mouth 2 (two) times daily as needed for pain. 15 tablet 3   Potassium Chloride ER 20 MEQ TBCR Take 1 tablet by mouth daily.     predniSONE (DELTASONE) 20 MG tablet      predniSONE (DELTASONE) 5 MG tablet Take 10 mg by mouth 2 (two) times daily.     pregabalin (LYRICA) 75 MG capsule      Propylene Glycol 0.6 % SOLN as directed Ophthalmic     QUEtiapine (SEROQUEL) 25 MG tablet Take 0.5 tablets (12.5 mg total) by mouth 2 (two) times daily.     RESTASIS 0.05 % ophthalmic emulsion Place 1 drop into both eyes 2 (two) times daily.     saccharomyces boulardii (FLORASTOR) 250 MG capsule Take 250 mg by mouth 2 (two) times daily.     simethicone (MYLICON) 125 MG chewable tablet Chew 125 mg by mouth every 6 (six) hours as needed for flatulence.     sodium chloride (OCEAN) 0.65 % SOLN nasal spray Place 1 spray into both nostrils as needed for congestion. (Patient taking differently: Place 1 spray into both nostrils daily as needed for congestion.)     sulfamethoxazole-trimethoprim (BACTRIM  DS) 800-160 MG tablet Take 1 tablet by mouth 2 (two) times daily. 14 tablet 0   terbinafine (LAMISIL) 250 MG tablet Take 250 mg by mouth daily.     ticagrelor (BRILINTA) 60 MG TABS tablet 1 tablet Orally Twice a day for 30 day(s)     ticagrelor (BRILINTA) 90 MG TABS tablet Take by mouth 2 (two) times daily.     tiZANidine (ZANAFLEX) 4 MG tablet      traMADol (ULTRAM) 50 MG tablet Take 1 tablet (50 mg total) by mouth every 8 (eight) hours as needed. 90 tablet 2   traZODone (DESYREL) 50 MG tablet Take 50 mg by mouth at bedtime.     valACYclovir (VALTREX) 1000 MG tablet      No current facility-administered medications for this visit.    Allergies: Allergies  Allergen Reactions   Hydromorphone Anaphylaxis    Other reaction(s): Unknown  Other Reaction(s): GI Intolerance, Unknown   Keflex [Cephalexin] Nausea And Vomiting   Meperidine Anaphylaxis    Other Reaction(s): Respiratory Distress   Penicillins Anaphylaxis    Has patient had a PCN reaction causing immediate rash, facial/tongue/throat swelling, SOB or lightheadedness with hypotension: Yes  Has patient had a PCN reaction causing severe rash involving mucus membranes or skin necrosis: No  Has patient had a PCN reaction that required hospitalization No  Has patient had a PCN reaction occurring within the last 10 years: No  If all of the above answers are "NO", then may proceed with Cephalosporin use.  Other reaction(s): Unknown, Unknown  Other Reaction(s): GI Intolerance, Unknown   Sulfa Antibiotics Nausea And Vomiting  Other reaction(s): Unknown  Other Reaction(s): Unknown   Acetaminophen     Other reaction(s): Unknown Other reaction(s): Unknown   Afluria Preservative Free [Influenza Vac Split Quad]     Other reaction(s): Unknown   Aspirin     Other Reaction(s): GI Intolerance   Bisphosphonates     GI intolerance  Other reaction(s): Unknown, Unknown  Other Reaction(s): Unknown   Codeine    Cortisone     Other  Reaction(s): Other (See Comments)  Reaction: Cardiovascular Arrest (ALLERGY/intolerance)   Fentanyl Other (See Comments)    "felt like I had demons in my head"  Other reaction(s): Unknown, Unknown  Other Reaction(s): Unknown   Influenza Vaccines     Other Reaction(s): Not available   Influenza Virus Vaccine Other (See Comments)    Other reaction(s): Unknown  Other Reaction(s): Unknown   Iodine     Other reaction(s): Unknown Other reaction(s): Unknown   Iohexol     Other reaction(s): Unknown, Unknown Other reaction(s): Unknown, Unknown   Levofloxacin     Other reaction(s): Unknown, Unknown  Other Reaction(s): Mental Status Changes, Unknown   Meperidine Hcl     Other reaction(s): Unknown, Unknown  Other Reaction(s): Unknown   Misc. Sulfonamide Containing Compounds     Other reaction(s): Unknown   Morphine Swelling    Other reaction(s): Unknown, Unknown  Other Reaction(s): GI Intolerance, Unknown   Other     Other Reaction(s): Not available   Oxycodone     Other reaction(s): Unknown  Other Reaction(s): Unknown   Oxycodone Hcl     Other reaction(s): Unknown Other reaction(s): Unknown   Oxytetracycline Nausea And Vomiting    Other reaction(s): Unknown  Other Reaction(s): Not available   Penicillamine    Percocet [Oxycodone-Acetaminophen] Swelling    Mouth swelling.   Shellfish Allergy     Glucosamine not an option  Other Reaction(s): Not available   Troleandomycin Swelling    Other Reaction(s): GI Intolerance   Ciprofloxacin Nausea Only and Rash    Other reaction(s): Unknown, Unknown  Other Reaction(s): Unknown   Iodinated Contrast Media Rash    Other Reaction(s): Respiratory Distress, Unknown   Levaquin [Levofloxacin In D5w] Nausea And Vomiting and Rash   Morphine And Codeine Rash   Oxycodone-Acetaminophen Rash    Other Reaction(s): GI Intolerance   Sulfur Rash    Other Reaction(s): GI Intolerance    Past Medical History:  Diagnosis Date   Acid  reflux    Anxiety    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   Depression    Fibromyalgia    Hypertension    Neuropathy    Rheumatoid arthritis (HCC)    Stroke (HCC) 02/17/14   Past Surgical History:  Procedure Laterality Date   ABDOMINAL HYSTERECTOMY     ANKLE RECONSTRUCTION     APPENDECTOMY     BACK SURGERY     CHOLECYSTECTOMY     KNEE SURGERY     Family History  Problem Relation Age of Onset   Hypertension Father    Transient ischemic attack Father    Hypertension Brother    Leukemia Brother    CVA Maternal Grandfather    Social History   Socioeconomic History   Marital status: Divorced    Spouse name: Not on file   Number of children: 0   Years of education: 12   Highest education level: 12th grade  Occupational History   Not on file  Tobacco Use   Smoking status: Never  Passive exposure: Never   Smokeless tobacco: Never  Vaping Use   Vaping Use: Never used  Substance and Sexual Activity   Alcohol use: No   Drug use: No   Sexual activity: Not Currently  Other Topics Concern   Not on file  Social History Narrative   Not on file   Social Determinants of Health   Financial Resource Strain: Low Risk  (01/06/2022)   Overall Financial Resource Strain (CARDIA)    Difficulty of Paying Living Expenses: Not hard at all  Food Insecurity: No Food Insecurity (01/06/2022)   Hunger Vital Sign    Worried About Running Out of Food in the Last Year: Never true    Ran Out of Food in the Last Year: Never true  Transportation Needs: No Transportation Needs (01/06/2022)   PRAPARE - Administrator, Civil Service (Medical): No    Lack of Transportation (Non-Medical): No  Physical Activity: Inactive (01/06/2022)   Exercise Vital Sign    Days of Exercise per Week: 0 days    Minutes of Exercise per Session: 0 min  Stress: No Stress Concern Present (01/06/2022)   [employer] of Occupational Health - Occupational Stress Questionnaire     Feeling of Stress : Not at all  Social Connections: Moderately Isolated (01/06/2022)   Social Connection and Isolation Panel [NHANES]    Frequency of Communication with Friends and Family: More than three times a week    Frequency of Social Gatherings with Friends and Family: More than three times a week    Attends Religious Services: More than 4 times per year    Active Member of [employer] or Organizations: No    Attends Banker Meetings: Never    Marital Status: Divorced  Catering manager Violence: Not At Risk (01/06/2022)   Humiliation, Afraid, Rape, and Kick questionnaire    Fear of Current or Ex-Partner: No    Emotionally Abused: No    Physically Abused: No    Sexually Abused: No    Review of Systems Constitutional: Patient denies any unintentional weight loss or change in strength lntegumentary: Patient denies any rashes or pruritus Cardiovascular: As per HPI  Respiratory: Patient denies shortness of breath Gastrointestinal: As per HPI Musculoskeletal: Patient reports some weakness Neurologic: Patient denies convulsions or seizures Psychiatric: Patient reports being her own medical decision maker (re: cognition) Allergic/Immunologic: Patient denies recent allergic reaction(s) Hematologic/Lymphatic: Patient denies bleeding tendencies Endocrine: Patient reports chills/sweats  GU: As per HPI.  OBJECTIVE Vitals:   12/05/22 1009  BP: 96/65  Pulse: 90  Temp: 97.8 F (36.6 C)   There is no height or weight on file to calculate BMI.  Physical Examination  Constitutional: No obvious distress; patient is non-toxic appearing  Cardiovascular: No visible lower extremity edema.  Respiratory: The patient does not have audible wheezing/stridor; respirations do not appear labored  Gastrointestinal: Abdomen non-distended Musculoskeletal: Normal ROM of UEs  Skin: No obvious rashes/open sores  Neurologic: CN 2-12 grossly intact Psychiatric: Answered questions appropriately  with normal affect; gives good history today and seems to have intact memory of recent events and medical history Hematologic/Lymphatic/Immunologic: No obvious bruises or sites of spontaneous bleeding  UA: positive for >30 WBC/hpf, >30 RBC/hpf, bacteria (many) PVR: 189 ml   ASSESSMENT Frequent urination - Plan: Urinalysis, Routine w reflex microscopic, BLADDER SCAN AMB NON-IMAGING  Recurrent UTI - Plan: estradiol (ESTRACE) 0.1 MG/GM vaginal cream  Hospital discharge follow-up  Atrophic vaginitis - Plan: estradiol (ESTRACE) 0.1 MG/GM vaginal cream  Urinary urgency  Urinary frequency  Urge incontinence  Ambulatory dysfunction  Immunosuppressed status (HCC)  Bladder diverticulum  OAB (overactive bladder)  Rheumatoid arthritis involving ankle, unspecified laterality, unspecified whether rheumatoid factor present (HCC)  Neuropathy  We reviewed her history and current situation in detail. Discussed UTI risk factors which include chronic immunosuppression with Humira for RA, IBS with diarrhea, ambulatory dysfunction, incontinence / soiling, likely vaginal atrophy, and incomplete bladder emptying as per today's PVR. We discussed that her incomplete bladder emptying may be due to Oxybutynin use, her bladder diverticulum, or possible neurogenic bladder (risk factors include spinal stenosis with neuropathy and prior stroke). Agreed to discontinue Oxybutynin.   We discussed the possible etiologies of recurrent UTls including ascending infection related to intercourse; vaginal atrophy; transmural infection that has been treated incompletely; urinary tract stones; incomplete bladder emptying with urinary stasis; kidney or bladder tumor; urethral diverticulum; and colonization of  vagina and urinary tract with pathologic, adherent organisms.   For UTI prevention we discussed options including: Adequate fluid intake (>1.5 liters/day) to flush out the urinary tract. - Go to the bathroom to  urinate every 4-6 hours while awake to minimize urinary stasis / bacterial overgrowth in the bladder. - Proanthocyanidin (PAC) supplement 36 mg daily; must be soluble (insoluble form of PAC will be ineffective). Recommend Ellura. D-mannose 2 g daily Vitamin C supplement Probiotic to maintain healthy vaginal microbiome - Topical vaginal estrogen for vaginal atrophy. - UTI prophylaxis with a daily low dose antibiotic. We discussed the potential risks of prolonged antibiotic treatment particularly with the risks of developing antibiotic resistance. Advised to resume use of daily low dose Nitrofurantoin for UTI prophylaxis after completion of IV antibiotic course.   For dysuria: Discussed option to take over-the-counter Pyridium (commonly known under the AZO brand name) for bladder pain. No more than 3 days at a time.  Patient ultimately decided to start with topical vaginal estrogen cream; educational handouts provided.  Will plan for follow up in 4 weeks with PVR recheck. If still elevated, will likely advise cystoscopy with Dr. Ronne Binning to further evaluate diverticulum and/or urodynamic testing for evaluate for neurogenic bladder with detrusor hypocontractility.   Pt verbalized understanding and agreement. All questions were answered.   PLAN Advised the following: 1. Urine culture. 2. Complete IV antibiotic as planned then restart Nitrofurantoin 50 mg nightly for UTI prophylaxis. 3. Discontinue Oxybutynin XL 5 mg.  4. Start topical vaginal estrogen cream every other night. 5. Maintain adequate fluid intake daily to flush out the urinary tract. 6. Consider OTC supplements for UTI prevention (see handout).  7. Return in about 4 weeks (around 01/02/2023) for UA, PVR, & f/u with Evette Georges NP.  Orders Placed This Encounter  Procedures   Urinalysis, Routine w reflex microscopic   BLADDER SCAN AMB NON-IMAGING   Total time spent caring for the patient today was over 40 minutes. This  includes time spent on the date of the visit reviewing the patient's chart before the visit, time spent during the visit, and time spent after the visit on documentation. Over 50% of that time was spent in face-to-face time with this patient for direct counseling. E&M based on time and complexity of medical decision making.  It has been explained that the patient is to follow regularly with their PCP in addition to all other providers involved in their care and to follow instructions provided by these respective offices. Patient advised to contact urology clinic if any urologic-pertaining questions, concerns, new symptoms or problems arise  in the interim period.  Patient Instructions  Recommendations regarding UTI prevention / management:  When UTI symptoms occur: Call urology office to request order for urine culture. We recommend waiting for urine culture result prior to use of any antibiotics.  For bladder pain/ burning with urination: Over the counter Pyridium (phenazopyridine) as needed (commonly known under the "AZO" brand). No more than 3 days consecutively at a time due to risk for methemoglobinemia, liver function issues, and bone health damage with long term use of Pyridium.  Routine use for UTI prevention: - Low dose antibiotic daily for UTI prophylaxis. - Topical vaginal estrogen for vaginal atrophy. Adequate fluid intake (>1.5 liters/day) to flush out the urinary tract. - Go to the bathroom to urinate every 4-6 hours while awake to minimize urinary stasis / bacterial overgrowth in the bladder. - Proanthocyanidin (PAC) supplement 36 mg daily; must be soluble (insoluble form of PAC will be ineffective). Recommended brand: Ellura. This is an over-the-counter supplement (often must be found/ purchased online) supplement derived from cranberries with concentrated active component: Proanthocyanidin (PAC) 36 mg daily. Decreases bacterial adherence to bladder lining. Not recommended for  patients with interstitial cystitis due to acidity. - D-mannose powder (2 grams daily). This is an over-the-counter supplement which decreases bacterial adherence to bladder lining (it is a sugar that inhibits bacterial adherence to urothelial cells by binding to the pili of enteric bacteria). Take as per manufacturer recommendation. Can be used as an alternative or in addition to the concentrated cranberry supplement. Not recommended for diabetic patients due to sugar content. - Vitamin C supplement to acidify urine to minimize bacterial growth. Not recommended for patients with interstitial cystitis due to acidity. - Probiotic to maintain healthy vaginal microbiome to suppress bacteria at urethral opening. Brand recommendations: Darrold Junker (includes probiotic & D-mannose ), Feminine Balance (highest concentration of lactobacillus) or Hyperbiotic Pro 15.  Electronically signed by:  Donnita Falls, FNP   12/05/22    11:32 AM

## 2022-12-07 DIAGNOSIS — M6281 Muscle weakness (generalized): Secondary | ICD-10-CM | POA: Diagnosis not present

## 2022-12-07 DIAGNOSIS — M069 Rheumatoid arthritis, unspecified: Secondary | ICD-10-CM | POA: Diagnosis not present

## 2022-12-07 DIAGNOSIS — R2681 Unsteadiness on feet: Secondary | ICD-10-CM | POA: Diagnosis not present

## 2022-12-07 DIAGNOSIS — M1711 Unilateral primary osteoarthritis, right knee: Secondary | ICD-10-CM | POA: Diagnosis not present

## 2022-12-07 DIAGNOSIS — F331 Major depressive disorder, recurrent, moderate: Secondary | ICD-10-CM | POA: Diagnosis not present

## 2022-12-08 DIAGNOSIS — M6281 Muscle weakness (generalized): Secondary | ICD-10-CM | POA: Diagnosis not present

## 2022-12-08 DIAGNOSIS — R2681 Unsteadiness on feet: Secondary | ICD-10-CM | POA: Diagnosis not present

## 2022-12-08 DIAGNOSIS — F331 Major depressive disorder, recurrent, moderate: Secondary | ICD-10-CM | POA: Diagnosis not present

## 2022-12-08 DIAGNOSIS — M069 Rheumatoid arthritis, unspecified: Secondary | ICD-10-CM | POA: Diagnosis not present

## 2022-12-08 DIAGNOSIS — M1711 Unilateral primary osteoarthritis, right knee: Secondary | ICD-10-CM | POA: Diagnosis not present

## 2022-12-09 DIAGNOSIS — F331 Major depressive disorder, recurrent, moderate: Secondary | ICD-10-CM | POA: Diagnosis not present

## 2022-12-09 DIAGNOSIS — M069 Rheumatoid arthritis, unspecified: Secondary | ICD-10-CM | POA: Diagnosis not present

## 2022-12-09 DIAGNOSIS — M1711 Unilateral primary osteoarthritis, right knee: Secondary | ICD-10-CM | POA: Diagnosis not present

## 2022-12-09 DIAGNOSIS — R2681 Unsteadiness on feet: Secondary | ICD-10-CM | POA: Diagnosis not present

## 2022-12-09 DIAGNOSIS — M6281 Muscle weakness (generalized): Secondary | ICD-10-CM | POA: Diagnosis not present

## 2022-12-10 DIAGNOSIS — M6281 Muscle weakness (generalized): Secondary | ICD-10-CM | POA: Diagnosis not present

## 2022-12-10 DIAGNOSIS — F331 Major depressive disorder, recurrent, moderate: Secondary | ICD-10-CM | POA: Diagnosis not present

## 2022-12-10 DIAGNOSIS — M069 Rheumatoid arthritis, unspecified: Secondary | ICD-10-CM | POA: Diagnosis not present

## 2022-12-10 DIAGNOSIS — R2681 Unsteadiness on feet: Secondary | ICD-10-CM | POA: Diagnosis not present

## 2022-12-10 DIAGNOSIS — M1711 Unilateral primary osteoarthritis, right knee: Secondary | ICD-10-CM | POA: Diagnosis not present

## 2022-12-11 DIAGNOSIS — R2681 Unsteadiness on feet: Secondary | ICD-10-CM | POA: Diagnosis not present

## 2022-12-11 DIAGNOSIS — F331 Major depressive disorder, recurrent, moderate: Secondary | ICD-10-CM | POA: Diagnosis not present

## 2022-12-11 DIAGNOSIS — M1711 Unilateral primary osteoarthritis, right knee: Secondary | ICD-10-CM | POA: Diagnosis not present

## 2022-12-11 DIAGNOSIS — M6281 Muscle weakness (generalized): Secondary | ICD-10-CM | POA: Diagnosis not present

## 2022-12-11 DIAGNOSIS — M069 Rheumatoid arthritis, unspecified: Secondary | ICD-10-CM | POA: Diagnosis not present

## 2022-12-12 DIAGNOSIS — M6281 Muscle weakness (generalized): Secondary | ICD-10-CM | POA: Diagnosis not present

## 2022-12-12 DIAGNOSIS — R2681 Unsteadiness on feet: Secondary | ICD-10-CM | POA: Diagnosis not present

## 2022-12-12 DIAGNOSIS — M069 Rheumatoid arthritis, unspecified: Secondary | ICD-10-CM | POA: Diagnosis not present

## 2022-12-12 DIAGNOSIS — F331 Major depressive disorder, recurrent, moderate: Secondary | ICD-10-CM | POA: Diagnosis not present

## 2022-12-12 DIAGNOSIS — M1711 Unilateral primary osteoarthritis, right knee: Secondary | ICD-10-CM | POA: Diagnosis not present

## 2022-12-15 DIAGNOSIS — M069 Rheumatoid arthritis, unspecified: Secondary | ICD-10-CM | POA: Diagnosis not present

## 2022-12-15 DIAGNOSIS — I10 Essential (primary) hypertension: Secondary | ICD-10-CM | POA: Diagnosis not present

## 2022-12-16 DIAGNOSIS — M1711 Unilateral primary osteoarthritis, right knee: Secondary | ICD-10-CM | POA: Diagnosis not present

## 2022-12-16 DIAGNOSIS — M6281 Muscle weakness (generalized): Secondary | ICD-10-CM | POA: Diagnosis not present

## 2022-12-16 DIAGNOSIS — R2681 Unsteadiness on feet: Secondary | ICD-10-CM | POA: Diagnosis not present

## 2022-12-16 DIAGNOSIS — M069 Rheumatoid arthritis, unspecified: Secondary | ICD-10-CM | POA: Diagnosis not present

## 2022-12-16 DIAGNOSIS — F331 Major depressive disorder, recurrent, moderate: Secondary | ICD-10-CM | POA: Diagnosis not present

## 2022-12-17 DIAGNOSIS — M6281 Muscle weakness (generalized): Secondary | ICD-10-CM | POA: Diagnosis not present

## 2022-12-17 DIAGNOSIS — M069 Rheumatoid arthritis, unspecified: Secondary | ICD-10-CM | POA: Diagnosis not present

## 2022-12-17 DIAGNOSIS — M1711 Unilateral primary osteoarthritis, right knee: Secondary | ICD-10-CM | POA: Diagnosis not present

## 2022-12-17 DIAGNOSIS — F331 Major depressive disorder, recurrent, moderate: Secondary | ICD-10-CM | POA: Diagnosis not present

## 2022-12-17 DIAGNOSIS — F13232 Sedative, hypnotic or anxiolytic dependence with withdrawal with perceptual disturbance: Secondary | ICD-10-CM | POA: Diagnosis not present

## 2022-12-17 DIAGNOSIS — F419 Anxiety disorder, unspecified: Secondary | ICD-10-CM | POA: Diagnosis not present

## 2022-12-17 DIAGNOSIS — R2681 Unsteadiness on feet: Secondary | ICD-10-CM | POA: Diagnosis not present

## 2022-12-18 DIAGNOSIS — M069 Rheumatoid arthritis, unspecified: Secondary | ICD-10-CM | POA: Diagnosis not present

## 2022-12-18 DIAGNOSIS — R2681 Unsteadiness on feet: Secondary | ICD-10-CM | POA: Diagnosis not present

## 2022-12-18 DIAGNOSIS — M6281 Muscle weakness (generalized): Secondary | ICD-10-CM | POA: Diagnosis not present

## 2022-12-18 DIAGNOSIS — F331 Major depressive disorder, recurrent, moderate: Secondary | ICD-10-CM | POA: Diagnosis not present

## 2022-12-18 DIAGNOSIS — M1711 Unilateral primary osteoarthritis, right knee: Secondary | ICD-10-CM | POA: Diagnosis not present

## 2022-12-19 DIAGNOSIS — M6281 Muscle weakness (generalized): Secondary | ICD-10-CM | POA: Diagnosis not present

## 2022-12-19 DIAGNOSIS — R2681 Unsteadiness on feet: Secondary | ICD-10-CM | POA: Diagnosis not present

## 2022-12-19 DIAGNOSIS — M069 Rheumatoid arthritis, unspecified: Secondary | ICD-10-CM | POA: Diagnosis not present

## 2022-12-19 DIAGNOSIS — M1711 Unilateral primary osteoarthritis, right knee: Secondary | ICD-10-CM | POA: Diagnosis not present

## 2022-12-19 DIAGNOSIS — F331 Major depressive disorder, recurrent, moderate: Secondary | ICD-10-CM | POA: Diagnosis not present

## 2022-12-23 DIAGNOSIS — F331 Major depressive disorder, recurrent, moderate: Secondary | ICD-10-CM | POA: Diagnosis not present

## 2022-12-23 DIAGNOSIS — M1711 Unilateral primary osteoarthritis, right knee: Secondary | ICD-10-CM | POA: Diagnosis not present

## 2022-12-23 DIAGNOSIS — R2681 Unsteadiness on feet: Secondary | ICD-10-CM | POA: Diagnosis not present

## 2022-12-23 DIAGNOSIS — M069 Rheumatoid arthritis, unspecified: Secondary | ICD-10-CM | POA: Diagnosis not present

## 2022-12-23 DIAGNOSIS — M6281 Muscle weakness (generalized): Secondary | ICD-10-CM | POA: Diagnosis not present

## 2022-12-24 DIAGNOSIS — M069 Rheumatoid arthritis, unspecified: Secondary | ICD-10-CM | POA: Diagnosis not present

## 2022-12-24 DIAGNOSIS — F331 Major depressive disorder, recurrent, moderate: Secondary | ICD-10-CM | POA: Diagnosis not present

## 2022-12-24 DIAGNOSIS — M6281 Muscle weakness (generalized): Secondary | ICD-10-CM | POA: Diagnosis not present

## 2022-12-24 DIAGNOSIS — M1711 Unilateral primary osteoarthritis, right knee: Secondary | ICD-10-CM | POA: Diagnosis not present

## 2022-12-24 DIAGNOSIS — R2681 Unsteadiness on feet: Secondary | ICD-10-CM | POA: Diagnosis not present

## 2022-12-25 ENCOUNTER — Institutional Professional Consult (permissible substitution): Payer: Medicare PPO | Admitting: Emergency Medicine

## 2022-12-25 DIAGNOSIS — R2681 Unsteadiness on feet: Secondary | ICD-10-CM | POA: Diagnosis not present

## 2022-12-25 DIAGNOSIS — M6281 Muscle weakness (generalized): Secondary | ICD-10-CM | POA: Diagnosis not present

## 2022-12-25 DIAGNOSIS — F331 Major depressive disorder, recurrent, moderate: Secondary | ICD-10-CM | POA: Diagnosis not present

## 2022-12-25 DIAGNOSIS — M1711 Unilateral primary osteoarthritis, right knee: Secondary | ICD-10-CM | POA: Diagnosis not present

## 2022-12-25 DIAGNOSIS — M069 Rheumatoid arthritis, unspecified: Secondary | ICD-10-CM | POA: Diagnosis not present

## 2022-12-26 DIAGNOSIS — F331 Major depressive disorder, recurrent, moderate: Secondary | ICD-10-CM | POA: Diagnosis not present

## 2022-12-26 DIAGNOSIS — M6281 Muscle weakness (generalized): Secondary | ICD-10-CM | POA: Diagnosis not present

## 2022-12-26 DIAGNOSIS — M069 Rheumatoid arthritis, unspecified: Secondary | ICD-10-CM | POA: Diagnosis not present

## 2022-12-26 DIAGNOSIS — F411 Generalized anxiety disorder: Secondary | ICD-10-CM | POA: Diagnosis not present

## 2022-12-26 DIAGNOSIS — R2681 Unsteadiness on feet: Secondary | ICD-10-CM | POA: Diagnosis not present

## 2022-12-26 DIAGNOSIS — M1711 Unilateral primary osteoarthritis, right knee: Secondary | ICD-10-CM | POA: Diagnosis not present

## 2022-12-29 DIAGNOSIS — M069 Rheumatoid arthritis, unspecified: Secondary | ICD-10-CM | POA: Diagnosis not present

## 2022-12-29 DIAGNOSIS — R2681 Unsteadiness on feet: Secondary | ICD-10-CM | POA: Diagnosis not present

## 2022-12-29 DIAGNOSIS — M1711 Unilateral primary osteoarthritis, right knee: Secondary | ICD-10-CM | POA: Diagnosis not present

## 2022-12-29 DIAGNOSIS — M6281 Muscle weakness (generalized): Secondary | ICD-10-CM | POA: Diagnosis not present

## 2022-12-29 DIAGNOSIS — F331 Major depressive disorder, recurrent, moderate: Secondary | ICD-10-CM | POA: Diagnosis not present

## 2022-12-30 ENCOUNTER — Encounter: Payer: Self-pay | Admitting: Urology

## 2022-12-30 ENCOUNTER — Ambulatory Visit (INDEPENDENT_AMBULATORY_CARE_PROVIDER_SITE_OTHER): Payer: Medicare PPO | Admitting: Urology

## 2022-12-30 VITALS — BP 116/63 | HR 63 | Temp 97.9°F

## 2022-12-30 DIAGNOSIS — R350 Frequency of micturition: Secondary | ICD-10-CM

## 2022-12-30 DIAGNOSIS — M1711 Unilateral primary osteoarthritis, right knee: Secondary | ICD-10-CM | POA: Diagnosis not present

## 2022-12-30 DIAGNOSIS — M6281 Muscle weakness (generalized): Secondary | ICD-10-CM | POA: Diagnosis not present

## 2022-12-30 DIAGNOSIS — R2681 Unsteadiness on feet: Secondary | ICD-10-CM | POA: Diagnosis not present

## 2022-12-30 DIAGNOSIS — M069 Rheumatoid arthritis, unspecified: Secondary | ICD-10-CM | POA: Diagnosis not present

## 2022-12-30 DIAGNOSIS — F331 Major depressive disorder, recurrent, moderate: Secondary | ICD-10-CM | POA: Diagnosis not present

## 2022-12-30 LAB — URINALYSIS, ROUTINE W REFLEX MICROSCOPIC
Bilirubin, UA: NEGATIVE
Ketones, UA: NEGATIVE
Nitrite, UA: POSITIVE — AB
Specific Gravity, UA: 1.01 (ref 1.005–1.030)
Urobilinogen, Ur: 2 mg/dL — ABNORMAL HIGH (ref 0.2–1.0)
pH, UA: 5 (ref 5.0–7.5)

## 2022-12-30 LAB — MICROSCOPIC EXAMINATION: WBC, UA: >30 /hpf — AB (ref 0–5)

## 2022-12-30 LAB — BLADDER SCAN AMB NON-IMAGING: Scan Result: 127

## 2022-12-30 NOTE — Progress Notes (Signed)
Name: Robin Blackburn DOB: 1945-10-18 MRN: [id number]  History of Present Illness: [name] is a 77 y.o. female who presents today for follow up visit at [facility]. She is accompanied by Robin Salina, CNA. She denies having a medical power of attorney.  - Past medical history includes cognitive communication deficit / prior stroke. She is a long term resident at [facility]. - GU history: 1. Recurrent UTI. - Risk factors include chronic immunosuppression with Humira for RA, IBS with diarrhea, ambulatory dysfunction, incontinence / soiling, likely vaginal atrophy. 2. OAB with urinary frequency, urgency, and urge incontinence.  3. Bladder diverticulum (multiple).  - Per CT on 03/12/2022.   Urine culture results in past 12 months: - 12/11/2021: Negative.  - 11/06/2022: Positive for >100k Klebsiella pneumoniae; treated with Aztreonam IV due to her multiple antibiotic allergies.  At last visit on 12/05/2022: - Seen for hospital discharge follow up. Still completing IV Aztreonam course. - Discussed UTI risk factors which include chronic immunosuppression with Humira for RA, IBS with diarrhea, ambulatory dysfunction, incontinence / soiling, likely vaginal atrophy, and incomplete bladder emptying as per today's PVR.  - We discussed that her incomplete bladder emptying may be due to Oxybutynin use, her bladder diverticulum, or possible neurogenic bladder (risk factors include spinal stenosis with neuropathy and prior stroke). - The plan was: 1. Urine culture. 2. Complete IV antibiotic as planned then restart Nitrofurantoin 50 mg nightly for UTI prophylaxis. 3. Discontinue Oxybutynin XL 5 mg. 4. Start topical vaginal estrogen cream every other night. 5. Maintain adequate fluid intake daily to flush out the urinary tract. 6. Consider OTC supplements for UTI prevention (see handout).  7. Return in about 4 weeks (around 01/02/2023) for UA, PVR, & f/u with Evette Georges NP.    Today: She reports that she completed the IV Aztreonam course >1 week ago. She states that her dysuria was improved when she finished the antibiotic but recently the dysuria has been increasing; she is concerned today about possible recurrence of a UTI. She is taking Pyridium. She reports persistent urinary urgency and frequency but states it is better than before taking the antibiotic. Reports occasional straining to void; denies sensations of incomplete emptying.   She has been using vaginal estrogen cream at a frequency of every other night. States that stings a little bit.  She reports intermittent LLQ pain. Denies constipation or blood in stool. History of IBS and diverticulitis. Denies fevers.   Fall Screening: Do you usually have a device to assist in your mobility? Yes - wheelchair   Medications: Current Outpatient Medications  Medication Sig Dispense Refill   acetaminophen (TYLENOL) 500 MG tablet Take 500 mg by mouth every 6 (six) hours as needed for mild pain or moderate pain.     Adalimumab (HUMIRA, 2 PEN,) 40 MG/0.8ML PNKT      ALPRAZolam (XANAX) 0.25 MG tablet 1 tablet     ALPRAZolam (XANAX) 0.5 MG tablet Take 0.5 mg by mouth daily.     amLODipine (NORVASC) 2.5 MG tablet Take 1 tablet by mouth daily.     benzocaine (ORAJEL) 10 % mucosal gel Use as directed 1 application in the mouth or throat as needed for mouth pain.     benzocaine (ORAJEL) 10 % mucosal gel 1 application as needed Mouth/Throat Four times a day     betamethasone dipropionate 0.05 % cream Apply topically.     BRILINTA 60 MG TABS tablet Take 60 mg by mouth daily.  busPIRone (BUSPAR) 5 MG tablet      capsicum (ZOSTRIX) 0.075 % topical cream 1 application as needed Externally Three times a day     cefTRIAXone (ROCEPHIN) 1 g injection Inject 1 g into the muscle daily.     cefUROXime (CEFTIN) 500 MG tablet      cetirizine (ZYRTEC) 10 MG tablet 1 tablet Orally Once a day for 30 day(s)     chlorhexidine  (PERIDEX) 0.12 % solution Use as directed 15 mLs in the mouth or throat 2 (two) times daily.     Cholecalciferol 5000 units TABS Take 1 tablet by mouth daily.      clobetasol (TEMOVATE) 0.05 % external solution      clobetasol cream (TEMOVATE) 0.05 % Apply 1 application topically 2 (two) times daily.     clopidogrel (PLAVIX) 75 MG tablet Take 75 mg by mouth at bedtime.     Cranberry 250 MG TABS Take by mouth.     cycloSPORINE (RESTASIS) 0.05 % ophthalmic emulsion 1 drop into affected eye Ophthalmic Twice a day     desloratadine (CLARINEX) 5 MG tablet Take 5 mg by mouth every morning.      diclofenac Sodium (VOLTAREN) 1 % GEL APPLY 2 GRAMS TO THE AFFECTED AREA(S) BY TOPICAL ROUTE 4 TIMES PER DAY     dicyclomine (BENTYL) 10 MG capsule      dicyclomine (BENTYL) 20 MG tablet Take 20 mg by mouth every 6 (six) hours. Abdominal cramps     estradiol (ESTRACE) 0.1 MG/GM vaginal cream Discard plastic applicator. Insert a blueberry size amount (approximately 1 gram) of cream on fingertip inside vagina at bedtime every night for 1 week then every other night for long term use. 30 g 3   fluconazole (DIFLUCAN) 150 MG tablet      FLUoxetine (PROZAC) 20 MG capsule      fluticasone (FLONASE) 50 MCG/ACT nasal spray Place 1 spray into both nostrils daily.     folic acid (FOLVITE) 1 MG tablet Take 1 mg by mouth daily.     fosfomycin (MONUROL) 3 g PACK Take by mouth.     furosemide (LASIX) 40 MG tablet      gabapentin (NEURONTIN) 300 MG capsule Take 1 capsule (300 mg total) by mouth 3 (three) times daily. 90 capsule 3   guaiFENesin (MUCINEX) 600 MG 12 hr tablet Take 400 mg by mouth 2 (two) times daily.     guaiFENesin (ROBITUSSIN) 100 MG/5ML liquid 10 ml as needed Orally every 4 hrs     hydrOXYzine (ATARAX) 25 MG tablet Take 25 mg by mouth at bedtime.     hydrOXYzine (VISTARIL) 25 MG capsule Take 25 mg by mouth daily as needed.     ivermectin (STROMECTOL) 3 MG TABS tablet      ketoconazole (NIZORAL) 2 % cream       ketoconazole (NIZORAL) 2 % shampoo      lansoprazole (PREVACID) 30 MG capsule Take 60 mg by mouth 2 (two) times daily.     leflunomide (ARAVA) 10 MG tablet 1 tablet     lidocaine (LIDODERM) 5 % Place 1 patch onto the skin every 12 (twelve) hours. Remove & Discard patch within 12 hours or as directed 30 patch 4   loperamide (IMODIUM A-D) 2 MG tablet Take 2 mg by mouth 4 (four) times daily as needed for diarrhea or loose stools.     Melatonin 2.5 MG CAPS Take by mouth 2 (two) times daily.     melatonin 5  MG TABS 1 tablet in the evening Orally Once a day for 30 day(s)     methocarbamol (ROBAXIN) 750 MG tablet 1 tablet Orally every 4 hrs for 30 day(s)     metoprolol tartrate (LOPRESSOR) 25 MG tablet Take 1 tablet (25 mg total) by mouth 2 (two) times daily. 60 tablet 2   mirtazapine (REMERON) 7.5 MG tablet      Multiple Vitamin (MULTIVITAMIN) tablet Take 1 tablet by mouth daily.     nitrofurantoin (MACRODANTIN) 50 MG capsule Take 1 capsule (50 mg total) by mouth at bedtime. 30 capsule 11   nitrofurantoin, macrocrystal-monohydrate, (MACROBID) 100 MG capsule Take 1 capsule (100 mg total) by mouth every 12 (twelve) hours. 14 capsule 0   nystatin (MYCOSTATIN/NYSTOP) powder Apply topically.     ondansetron (ZOFRAN) 4 MG tablet Take 4 mg by mouth every 6 (six) hours as needed for nausea or vomiting.      ondansetron (ZOFRAN-ODT) 4 MG disintegrating tablet 1 tablet on the tongue and allow to dissolve Orally Once a day for 30 day(s)     PARoxetine (PAXIL) 40 MG tablet      permethrin (ELIMITE) 5 % cream      phenazopyridine (PYRIDIUM) 100 MG tablet Take 1 tablet (100 mg total) by mouth 2 (two) times daily as needed for pain. 15 tablet 3   Potassium Chloride ER 20 MEQ TBCR Take 1 tablet by mouth daily.     predniSONE (DELTASONE) 20 MG tablet      predniSONE (DELTASONE) 5 MG tablet Take 10 mg by mouth 2 (two) times daily.     pregabalin (LYRICA) 75 MG capsule      Propylene Glycol 0.6 % SOLN as directed  Ophthalmic     QUEtiapine (SEROQUEL) 25 MG tablet Take 0.5 tablets (12.5 mg total) by mouth 2 (two) times daily.     RESTASIS 0.05 % ophthalmic emulsion Place 1 drop into both eyes 2 (two) times daily.     saccharomyces boulardii (FLORASTOR) 250 MG capsule Take 250 mg by mouth 2 (two) times daily.     simethicone (MYLICON) 125 MG chewable tablet Chew 125 mg by mouth every 6 (six) hours as needed for flatulence.     sodium chloride (OCEAN) 0.65 % SOLN nasal spray Place 1 spray into both nostrils as needed for congestion. (Patient taking differently: Place 1 spray into both nostrils daily as needed for congestion.)     sulfamethoxazole-trimethoprim (BACTRIM DS) 800-160 MG tablet Take 1 tablet by mouth 2 (two) times daily. 14 tablet 0   terbinafine (LAMISIL) 250 MG tablet Take 250 mg by mouth daily.     ticagrelor (BRILINTA) 60 MG TABS tablet 1 tablet Orally Twice a day for 30 day(s)     ticagrelor (BRILINTA) 90 MG TABS tablet Take by mouth 2 (two) times daily.     tiZANidine (ZANAFLEX) 4 MG tablet      traMADol (ULTRAM) 50 MG tablet Take 1 tablet (50 mg total) by mouth every 8 (eight) hours as needed. 90 tablet 2   traZODone (DESYREL) 50 MG tablet Take 50 mg by mouth at bedtime.     valACYclovir (VALTREX) 1000 MG tablet      No current facility-administered medications for this visit.    Allergies: Allergies  Allergen Reactions   Hydromorphone Anaphylaxis    Other reaction(s): Unknown  Other Reaction(s): GI Intolerance, Unknown   Keflex [Cephalexin] Nausea And Vomiting   Meperidine Anaphylaxis    Other Reaction(s): Respiratory Distress   Penicillins  Anaphylaxis    Has patient had a PCN reaction causing immediate rash, facial/tongue/throat swelling, SOB or lightheadedness with hypotension: Yes  Has patient had a PCN reaction causing severe rash involving mucus membranes or skin necrosis: No  Has patient had a PCN reaction that required hospitalization No  Has patient had a PCN  reaction occurring within the last 10 years: No  If all of the above answers are "NO", then may proceed with Cephalosporin use.  Other reaction(s): Unknown, Unknown  Other Reaction(s): GI Intolerance, Unknown   Sulfa Antibiotics Nausea And Vomiting    Other reaction(s): Unknown  Other Reaction(s): Unknown   Acetaminophen     Other reaction(s): Unknown Other reaction(s): Unknown   Afluria Preservative Free [Influenza Vac Split Quad]     Other reaction(s): Unknown   Aspirin     Other Reaction(s): GI Intolerance   Bisphosphonates     GI intolerance  Other reaction(s): Unknown, Unknown  Other Reaction(s): Unknown   Codeine    Cortisone     Other Reaction(s): Other (See Comments)  Reaction: Cardiovascular Arrest (ALLERGY/intolerance)   Fentanyl Other (See Comments)    "felt like I had demons in my head"  Other reaction(s): Unknown, Unknown  Other Reaction(s): Unknown   Influenza Vaccines     Other Reaction(s): Not available   Influenza Virus Vaccine Other (See Comments)    Other reaction(s): Unknown  Other Reaction(s): Unknown   Iodine     Other reaction(s): Unknown Other reaction(s): Unknown   Iohexol     Other reaction(s): Unknown, Unknown Other reaction(s): Unknown, Unknown   Levofloxacin     Other reaction(s): Unknown, Unknown  Other Reaction(s): Mental Status Changes, Unknown   Meperidine Hcl     Other reaction(s): Unknown, Unknown  Other Reaction(s): Unknown   Misc. Sulfonamide Containing Compounds     Other reaction(s): Unknown   Morphine Swelling    Other reaction(s): Unknown, Unknown  Other Reaction(s): GI Intolerance, Unknown   Other     Other Reaction(s): Not available   Oxycodone     Other reaction(s): Unknown  Other Reaction(s): Unknown   Oxycodone Hcl     Other reaction(s): Unknown Other reaction(s): Unknown   Oxytetracycline Nausea And Vomiting    Other reaction(s): Unknown  Other Reaction(s): Not available   Penicillamine     Percocet [Oxycodone-Acetaminophen] Swelling    Mouth swelling.   Shellfish Allergy     Glucosamine not an option  Other Reaction(s): Not available   Troleandomycin Swelling    Other Reaction(s): GI Intolerance   Ciprofloxacin Nausea Only and Rash    Other reaction(s): Unknown, Unknown  Other Reaction(s): Unknown   Iodinated Contrast Media Rash    Other Reaction(s): Respiratory Distress, Unknown   Levaquin [Levofloxacin In D5w] Nausea And Vomiting and Rash   Morphine And Codeine Rash   Oxycodone-Acetaminophen Rash    Other Reaction(s): GI Intolerance   Sulfur Rash    Other Reaction(s): GI Intolerance    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   Depression    Fibromyalgia    Hypertension    Neuropathy    Rheumatoid arthritis (HCC)    Stroke (HCC) 02/17/14   Past Surgical History:  Procedure Laterality Date   ABDOMINAL HYSTERECTOMY     ANKLE RECONSTRUCTION     APPENDECTOMY     BACK SURGERY     CHOLECYSTECTOMY     KNEE  SURGERY     Family History  Problem Relation Age of Onset   Hypertension Father    Transient ischemic attack Father    Hypertension Brother    Leukemia Brother    CVA Maternal Grandfather    Social History   Socioeconomic History   Marital status: Divorced    Spouse name: Not on file   Number of children: 0   Years of education: 12   Highest education level: 12th grade  Occupational History   Not on file  Tobacco Use   Smoking status: Never    Passive exposure: Never   Smokeless tobacco: Never  Vaping Use   Vaping status: Never Used  Substance and Sexual Activity   Alcohol use: No   Drug use: No   Sexual activity: Not Currently  Other Topics Concern   Not on file  Social History Narrative   Not on file   Social Determinants of Health   Financial Resource Strain: Low Risk  (01/06/2022)   Overall Financial Resource Strain (CARDIA)    Difficulty of  Paying Living Expenses: Not hard at all  Food Insecurity: No Food Insecurity (01/06/2022)   Hunger Vital Sign    Worried About Running Out of Food in the Last Year: Never true    Ran Out of Food in the Last Year: Never true  Transportation Needs: No Transportation Needs (01/06/2022)   PRAPARE - Administrator, Civil Service (Medical): No    Lack of Transportation (Non-Medical): No  Physical Activity: Inactive (11/07/2022)   [facility]    Exercise Vital Sign    Days of Exercise per Week: 0 days    Minutes of Exercise per Session: 0 min  Stress: No Stress Concern Present (01/06/2022)   [employer] of Occupational Health - Occupational Stress Questionnaire    Feeling of Stress : Not at all  Social Connections: Unknown (11/06/2022)   Received from [facility]   Social Network    Social Network: Not on file  Intimate Partner Violence: Unknown (11/06/2022)   Received from [facility]   HITS    Physically Hurt: Not on file    Insult or Talk Down To: Not on file    Threaten Physical Harm: Not on file    Scream or Curse: Not on file    Review of Systems Constitutional: Patient denies any unintentional weight loss or change in strength lntegumentary: Patient denies any rashes or pruritus Cardiovascular: Patient denies chest pain or syncope Respiratory: Patient denies shortness of breath Gastrointestinal: Per HPI Musculoskeletal: Patient denies muscle cramps or weakness Neurologic: Patient denies convulsions or seizures Allergic/Immunologic: Patient denies recent allergic reaction(s) Hematologic/Lymphatic: Patient denies bleeding tendencies Endocrine: Patient denies heat/cold intolerance  GU: As per HPI.  OBJECTIVE Vitals:   12/30/22 1332  BP: 116/63  Pulse: 63  Temp: 97.9 F (36.6 C)   There is no height or weight on file to calculate BMI.  Physical Examination  Constitutional: No obvious distress; patient is non-toxic  appearing  Cardiovascular: No visible lower extremity edema.  Respiratory: The patient does not have audible wheezing/stridor; respirations do not appear labored  Gastrointestinal: Abdomen non-distended Musculoskeletal: Normal ROM of UEs  Skin: No obvious rashes/open sores  Neurologic: CN 2-12 grossly intact Psychiatric: Answered questions appropriately with normal affect  Hematologic/Lymphatic/Immunologic: No obvious bruises or sites of spontaneous bleeding  UA: positive for >30 WBC/hpf, 11-30 RBC/hpf, bacteria (many) PVR: 127 ml  ASSESSMENT Frequent urination - Plan: Urinalysis,  Routine w reflex microscopic, BLADDER SCAN AMB NON-IMAGING  Offered I&O cath today for high quality urine specimen - she declined that & reports that staff were unable to do I&O cath at the hospital due to her vaginal atrophy with significantly narrowed vaginal introitus. Abnormal UA today; will check urine culture and treat based on results. Agreed not to treat empirically due to her multi-antibiotic allergies. She is aware that IV antibiotics may need to be repeated based on urine culture results / culture sensitivities.   Advised to continue Nitrofurantoin daily and topical vaginal estrogen cream every other night.   Will plan for follow up in 6 weeks or sooner if needed. Pt verbalized understanding and agreement. All questions were answered.  PLAN Advised the following: 1. Urine culture. 2. Continue nightly Nitrofurantoin. 3. Continue topical vaginal estrogen cream every other night. 4. Return in about 6 weeks (around 02/10/2023) for UA, PVR, & f/u with Evette Georges NP.  Orders Placed This Encounter  Procedures   Microscopic Examination   Urinalysis, Routine w reflex microscopic   BLADDER SCAN AMB NON-IMAGING   Total time spent caring for the patient today was over 30 minutes. This includes time spent on the date of the visit reviewing the patient's chart before the visit, time spent during the visit,  and time spent after the visit on documentation. Over 50% of that time was spent in face-to-face time with this patient for direct counseling. E&M based on time and complexity of medical decision making.  It has been explained that the patient is to follow regularly with their PCP in addition to all other providers involved in their care and to follow instructions provided by these respective offices. Patient advised to contact urology clinic if any urologic-pertaining questions, concerns, new symptoms or problems arise in the interim period.  There are no Patient Instructions on file for this visit.  Electronically signed by:  Donnita Falls, FNP   12/30/22    4:43 PM

## 2022-12-31 DIAGNOSIS — F5101 Primary insomnia: Secondary | ICD-10-CM | POA: Diagnosis not present

## 2022-12-31 DIAGNOSIS — B351 Tinea unguium: Secondary | ICD-10-CM | POA: Diagnosis not present

## 2022-12-31 DIAGNOSIS — F332 Major depressive disorder, recurrent severe without psychotic features: Secondary | ICD-10-CM | POA: Diagnosis not present

## 2022-12-31 DIAGNOSIS — M79674 Pain in right toe(s): Secondary | ICD-10-CM | POA: Diagnosis not present

## 2022-12-31 DIAGNOSIS — M79675 Pain in left toe(s): Secondary | ICD-10-CM | POA: Diagnosis not present

## 2023-01-01 DIAGNOSIS — F331 Major depressive disorder, recurrent, moderate: Secondary | ICD-10-CM | POA: Diagnosis not present

## 2023-01-01 DIAGNOSIS — M1711 Unilateral primary osteoarthritis, right knee: Secondary | ICD-10-CM | POA: Diagnosis not present

## 2023-01-01 DIAGNOSIS — R2681 Unsteadiness on feet: Secondary | ICD-10-CM | POA: Diagnosis not present

## 2023-01-01 DIAGNOSIS — M069 Rheumatoid arthritis, unspecified: Secondary | ICD-10-CM | POA: Diagnosis not present

## 2023-01-01 DIAGNOSIS — M6281 Muscle weakness (generalized): Secondary | ICD-10-CM | POA: Diagnosis not present

## 2023-01-02 ENCOUNTER — Encounter: Payer: Self-pay | Admitting: Physician Assistant

## 2023-01-02 DIAGNOSIS — M797 Fibromyalgia: Secondary | ICD-10-CM | POA: Diagnosis not present

## 2023-01-02 DIAGNOSIS — R2681 Unsteadiness on feet: Secondary | ICD-10-CM | POA: Diagnosis not present

## 2023-01-02 DIAGNOSIS — M6281 Muscle weakness (generalized): Secondary | ICD-10-CM | POA: Diagnosis not present

## 2023-01-02 DIAGNOSIS — M1711 Unilateral primary osteoarthritis, right knee: Secondary | ICD-10-CM | POA: Diagnosis not present

## 2023-01-02 DIAGNOSIS — G894 Chronic pain syndrome: Secondary | ICD-10-CM | POA: Diagnosis not present

## 2023-01-02 DIAGNOSIS — F331 Major depressive disorder, recurrent, moderate: Secondary | ICD-10-CM | POA: Diagnosis not present

## 2023-01-02 DIAGNOSIS — M069 Rheumatoid arthritis, unspecified: Secondary | ICD-10-CM | POA: Diagnosis not present

## 2023-01-03 DIAGNOSIS — M1711 Unilateral primary osteoarthritis, right knee: Secondary | ICD-10-CM | POA: Diagnosis not present

## 2023-01-03 DIAGNOSIS — M069 Rheumatoid arthritis, unspecified: Secondary | ICD-10-CM | POA: Diagnosis not present

## 2023-01-03 DIAGNOSIS — M6281 Muscle weakness (generalized): Secondary | ICD-10-CM | POA: Diagnosis not present

## 2023-01-03 DIAGNOSIS — F331 Major depressive disorder, recurrent, moderate: Secondary | ICD-10-CM | POA: Diagnosis not present

## 2023-01-03 DIAGNOSIS — R2681 Unsteadiness on feet: Secondary | ICD-10-CM | POA: Diagnosis not present

## 2023-01-05 ENCOUNTER — Ambulatory Visit
Admission: RE | Admit: 2023-01-05 | Discharge: 2023-01-05 | Disposition: A | Discharge status: Home / Self Care | Payer: Medicare PPO | Source: Ambulatory Visit | Attending: Physician Assistant | Admitting: Physician Assistant

## 2023-01-05 DIAGNOSIS — Z8673 Personal history of transient ischemic attack (TIA), and cerebral infarction without residual deficits: Secondary | ICD-10-CM

## 2023-01-05 DIAGNOSIS — I639 Cerebral infarction, unspecified: Secondary | ICD-10-CM | POA: Diagnosis not present

## 2023-01-05 DIAGNOSIS — F331 Major depressive disorder, recurrent, moderate: Secondary | ICD-10-CM | POA: Diagnosis not present

## 2023-01-05 DIAGNOSIS — I10 Essential (primary) hypertension: Secondary | ICD-10-CM

## 2023-01-05 DIAGNOSIS — M6281 Muscle weakness (generalized): Secondary | ICD-10-CM | POA: Diagnosis not present

## 2023-01-05 DIAGNOSIS — M1711 Unilateral primary osteoarthritis, right knee: Secondary | ICD-10-CM | POA: Diagnosis not present

## 2023-01-05 DIAGNOSIS — M069 Rheumatoid arthritis, unspecified: Secondary | ICD-10-CM | POA: Diagnosis not present

## 2023-01-05 DIAGNOSIS — R2681 Unsteadiness on feet: Secondary | ICD-10-CM | POA: Diagnosis not present

## 2023-01-06 DIAGNOSIS — F331 Major depressive disorder, recurrent, moderate: Secondary | ICD-10-CM | POA: Diagnosis not present

## 2023-01-06 DIAGNOSIS — M1711 Unilateral primary osteoarthritis, right knee: Secondary | ICD-10-CM | POA: Diagnosis not present

## 2023-01-06 DIAGNOSIS — R2681 Unsteadiness on feet: Secondary | ICD-10-CM | POA: Diagnosis not present

## 2023-01-06 DIAGNOSIS — M069 Rheumatoid arthritis, unspecified: Secondary | ICD-10-CM | POA: Diagnosis not present

## 2023-01-06 DIAGNOSIS — M6281 Muscle weakness (generalized): Secondary | ICD-10-CM | POA: Diagnosis not present

## 2023-01-07 DIAGNOSIS — F331 Major depressive disorder, recurrent, moderate: Secondary | ICD-10-CM | POA: Diagnosis not present

## 2023-01-07 DIAGNOSIS — M1711 Unilateral primary osteoarthritis, right knee: Secondary | ICD-10-CM | POA: Diagnosis not present

## 2023-01-07 DIAGNOSIS — R2681 Unsteadiness on feet: Secondary | ICD-10-CM | POA: Diagnosis not present

## 2023-01-07 DIAGNOSIS — M6281 Muscle weakness (generalized): Secondary | ICD-10-CM | POA: Diagnosis not present

## 2023-01-07 DIAGNOSIS — M069 Rheumatoid arthritis, unspecified: Secondary | ICD-10-CM | POA: Diagnosis not present

## 2023-01-08 DIAGNOSIS — M069 Rheumatoid arthritis, unspecified: Secondary | ICD-10-CM | POA: Diagnosis not present

## 2023-01-08 DIAGNOSIS — M1711 Unilateral primary osteoarthritis, right knee: Secondary | ICD-10-CM | POA: Diagnosis not present

## 2023-01-08 DIAGNOSIS — R2681 Unsteadiness on feet: Secondary | ICD-10-CM | POA: Diagnosis not present

## 2023-01-08 DIAGNOSIS — F331 Major depressive disorder, recurrent, moderate: Secondary | ICD-10-CM | POA: Diagnosis not present

## 2023-01-08 DIAGNOSIS — M6281 Muscle weakness (generalized): Secondary | ICD-10-CM | POA: Diagnosis not present

## 2023-01-08 DIAGNOSIS — M722 Plantar fascial fibromatosis: Secondary | ICD-10-CM | POA: Diagnosis not present

## 2023-01-09 DIAGNOSIS — M069 Rheumatoid arthritis, unspecified: Secondary | ICD-10-CM | POA: Diagnosis not present

## 2023-01-09 DIAGNOSIS — M1711 Unilateral primary osteoarthritis, right knee: Secondary | ICD-10-CM | POA: Diagnosis not present

## 2023-01-09 DIAGNOSIS — F331 Major depressive disorder, recurrent, moderate: Secondary | ICD-10-CM | POA: Diagnosis not present

## 2023-01-09 DIAGNOSIS — R2681 Unsteadiness on feet: Secondary | ICD-10-CM | POA: Diagnosis not present

## 2023-01-09 DIAGNOSIS — M6281 Muscle weakness (generalized): Secondary | ICD-10-CM | POA: Diagnosis not present

## 2023-01-12 DIAGNOSIS — M1711 Unilateral primary osteoarthritis, right knee: Secondary | ICD-10-CM | POA: Diagnosis not present

## 2023-01-12 DIAGNOSIS — M6281 Muscle weakness (generalized): Secondary | ICD-10-CM | POA: Diagnosis not present

## 2023-01-12 DIAGNOSIS — F331 Major depressive disorder, recurrent, moderate: Secondary | ICD-10-CM | POA: Diagnosis not present

## 2023-01-12 DIAGNOSIS — M069 Rheumatoid arthritis, unspecified: Secondary | ICD-10-CM | POA: Diagnosis not present

## 2023-01-12 DIAGNOSIS — R2681 Unsteadiness on feet: Secondary | ICD-10-CM | POA: Diagnosis not present

## 2023-01-13 DIAGNOSIS — R059 Cough, unspecified: Secondary | ICD-10-CM | POA: Diagnosis not present

## 2023-01-14 ENCOUNTER — Telehealth: Payer: Self-pay

## 2023-01-14 NOTE — Telephone Encounter (Signed)
Patient left a voice message 01-14-2023.  Requesting a call back to the facility [facility]) she lives at or call her to let her know the results from culture from 2 weeks ago.  She is hurting and needing medication to help with possible UTI.  Please advise.

## 2023-01-16 DIAGNOSIS — R300 Dysuria: Secondary | ICD-10-CM | POA: Diagnosis not present

## 2023-01-16 DIAGNOSIS — F411 Generalized anxiety disorder: Secondary | ICD-10-CM | POA: Diagnosis not present

## 2023-01-16 NOTE — Telephone Encounter (Signed)
Return Call to facility from triage line. Spoke with patient's nurse Richardo Hanks. Richardo Hanks is made aware of bladder scan results and Richardo Hanks states she will get an order from the provider on call there for a urine culture and she will share those results with our office.

## 2023-01-16 NOTE — Telephone Encounter (Signed)
Please see pt msg below and advise, it doesn't look like a urine culture was sent on her last visit 07/23.

## 2023-01-19 DIAGNOSIS — F331 Major depressive disorder, recurrent, moderate: Secondary | ICD-10-CM | POA: Diagnosis not present

## 2023-01-19 DIAGNOSIS — M069 Rheumatoid arthritis, unspecified: Secondary | ICD-10-CM | POA: Diagnosis not present

## 2023-01-19 DIAGNOSIS — M6281 Muscle weakness (generalized): Secondary | ICD-10-CM | POA: Diagnosis not present

## 2023-01-19 DIAGNOSIS — M1711 Unilateral primary osteoarthritis, right knee: Secondary | ICD-10-CM | POA: Diagnosis not present

## 2023-01-19 DIAGNOSIS — R2681 Unsteadiness on feet: Secondary | ICD-10-CM | POA: Diagnosis not present

## 2023-01-20 DIAGNOSIS — M6281 Muscle weakness (generalized): Secondary | ICD-10-CM | POA: Diagnosis not present

## 2023-01-20 DIAGNOSIS — R2681 Unsteadiness on feet: Secondary | ICD-10-CM | POA: Diagnosis not present

## 2023-01-20 DIAGNOSIS — F331 Major depressive disorder, recurrent, moderate: Secondary | ICD-10-CM | POA: Diagnosis not present

## 2023-01-20 DIAGNOSIS — M1711 Unilateral primary osteoarthritis, right knee: Secondary | ICD-10-CM | POA: Diagnosis not present

## 2023-01-20 DIAGNOSIS — M069 Rheumatoid arthritis, unspecified: Secondary | ICD-10-CM | POA: Diagnosis not present

## 2023-01-21 DIAGNOSIS — R2681 Unsteadiness on feet: Secondary | ICD-10-CM | POA: Diagnosis not present

## 2023-01-21 DIAGNOSIS — M1711 Unilateral primary osteoarthritis, right knee: Secondary | ICD-10-CM | POA: Diagnosis not present

## 2023-01-21 DIAGNOSIS — M069 Rheumatoid arthritis, unspecified: Secondary | ICD-10-CM | POA: Diagnosis not present

## 2023-01-21 DIAGNOSIS — F331 Major depressive disorder, recurrent, moderate: Secondary | ICD-10-CM | POA: Diagnosis not present

## 2023-01-21 DIAGNOSIS — M6281 Muscle weakness (generalized): Secondary | ICD-10-CM | POA: Diagnosis not present

## 2023-01-22 DIAGNOSIS — M1711 Unilateral primary osteoarthritis, right knee: Secondary | ICD-10-CM | POA: Diagnosis not present

## 2023-01-22 DIAGNOSIS — F331 Major depressive disorder, recurrent, moderate: Secondary | ICD-10-CM | POA: Diagnosis not present

## 2023-01-22 DIAGNOSIS — M6281 Muscle weakness (generalized): Secondary | ICD-10-CM | POA: Diagnosis not present

## 2023-01-22 DIAGNOSIS — M069 Rheumatoid arthritis, unspecified: Secondary | ICD-10-CM | POA: Diagnosis not present

## 2023-01-22 DIAGNOSIS — R2681 Unsteadiness on feet: Secondary | ICD-10-CM | POA: Diagnosis not present

## 2023-01-23 DIAGNOSIS — R2681 Unsteadiness on feet: Secondary | ICD-10-CM | POA: Diagnosis not present

## 2023-01-23 DIAGNOSIS — M069 Rheumatoid arthritis, unspecified: Secondary | ICD-10-CM | POA: Diagnosis not present

## 2023-01-23 DIAGNOSIS — F331 Major depressive disorder, recurrent, moderate: Secondary | ICD-10-CM | POA: Diagnosis not present

## 2023-01-23 DIAGNOSIS — M6281 Muscle weakness (generalized): Secondary | ICD-10-CM | POA: Diagnosis not present

## 2023-01-23 DIAGNOSIS — M1711 Unilateral primary osteoarthritis, right knee: Secondary | ICD-10-CM | POA: Diagnosis not present

## 2023-01-26 DIAGNOSIS — I10 Essential (primary) hypertension: Secondary | ICD-10-CM | POA: Diagnosis not present

## 2023-01-26 DIAGNOSIS — I679 Cerebrovascular disease, unspecified: Secondary | ICD-10-CM | POA: Diagnosis not present

## 2023-01-26 DIAGNOSIS — N3020 Other chronic cystitis without hematuria: Secondary | ICD-10-CM | POA: Diagnosis not present

## 2023-01-27 ENCOUNTER — Ambulatory Visit (INDEPENDENT_AMBULATORY_CARE_PROVIDER_SITE_OTHER): Payer: Medicare PPO | Admitting: Emergency Medicine

## 2023-01-27 ENCOUNTER — Encounter: Payer: Self-pay | Admitting: Emergency Medicine

## 2023-01-27 ENCOUNTER — Telehealth: Payer: Self-pay

## 2023-01-27 VITALS — BP 104/60 | HR 69 | Ht 66.0 in | Wt 152.0 lb

## 2023-01-27 DIAGNOSIS — J209 Acute bronchitis, unspecified: Secondary | ICD-10-CM | POA: Diagnosis not present

## 2023-01-27 DIAGNOSIS — R918 Other nonspecific abnormal finding of lung field: Secondary | ICD-10-CM

## 2023-01-27 MED ORDER — AZITHROMYCIN 250 MG PO TABS: ORAL_TABLET | ORAL | 0 refills | Status: DC

## 2023-01-27 NOTE — Progress Notes (Signed)
Subjective:    Patient ID: Robin Blackburn, female    DOB: 11/26/1945, 77 y.o.   MRN: [id number]  HPI 77 year old never smoker with a history of hypertension, GERD, rheumatoid arthritis and ordered Humira but has not been started, bladder diverticuli with frequent UTIs.  She had a pulmonary nodule in the left lower lobe noted on a CT renal stone study from 03/12/2022.  This prompted a repeat CT chest that was done on 08/22/2022 as below She reports significant hand and joint pain. She has cough with some congestion, white or yellow mucous. She has persistent clear nasal gtt. She is not having SOB, is able to work w PT/OT.   CT scan of the chest 08/22/2022 reviewed by me shows no mediastinal or hilar adenopathy.  The left lower lobe consolidative nodular opacity that was noted 03/2022 has resolved with some residual associated scar in the anterior left lower lobe.  There is a new 2.4 x 1.4 cm inferior right middle lobe nodular opacity and several scattered tiny perifissural nodules  CT chest 11/08/2022 reviewed, shows full resolution of the left-sided nodule, decrease in size of the right middle lobe nodule that now appears to be more scarlike.   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   Depression    Fibromyalgia    Hypertension    Neuropathy    Rheumatoid arthritis (HCC)    Stroke (HCC) 02/17/14     Family History  Problem Relation Age of Onset   Hypertension Father    Transient ischemic attack Father    Hypertension Brother    Leukemia Brother    CVA Maternal Grandfather     AML runs in family, multiple members  Social History   Socioeconomic History   Marital status: Divorced    Spouse name: Not on file   Number of children: 0   Years of education: 12   Highest education level: 12th grade  Occupational History   Not on file  Tobacco Use   Smoking status:  Never    Passive exposure: Never   Smokeless tobacco: Never  Vaping Use   Vaping status: Never Used  Substance and Sexual Activity   Alcohol use: No   Drug use: No   Sexual activity: Not Currently  Other Topics Concern   Not on file  Social History Narrative   Not on file   Social Determinants of Health   Financial Resource Strain: Low Risk  (01/06/2022)   Overall Financial Resource Strain (CARDIA)    Difficulty of Paying Living Expenses: Not hard at all  Food Insecurity: No Food Insecurity (01/06/2022)   Hunger Vital Sign    Worried About Running Out of Food in the Last Year: Never true    Ran Out of Food in the Last Year: Never true  Transportation Needs: No Transportation Needs (01/06/2022)   PRAPARE - Administrator, Civil Service (Medical): No    Lack of Transportation (Non-Medical): No  Physical Activity: Inactive (11/07/2022)   Received from [facility], [facility]   Exercise Vital Sign    Days of Exercise per Week: 0 days    Minutes of Exercise per Session: 0 min  Stress: No Stress Concern Present (01/06/2022)   [employer] of Occupational Health - Occupational Stress Questionnaire    Feeling of Stress :  Not at all  Social Connections: Unknown (11/06/2022)   Received from [facility], [facility]   Social Network    Social Network: Not on file  Intimate Partner Violence: Unknown (11/06/2022)   Received from [facility], [facility]   HITS    Physically Hurt: Not on file    Insult or Talk Down To: Not on file    Threaten Physical Harm: Not on file    Scream or Curse: Not on file     Allergies  Allergen Reactions   Hydromorphone Anaphylaxis    Other reaction(s): Unknown  Other Reaction(s): GI Intolerance, Unknown   Keflex [Cephalexin] Nausea And Vomiting   Meperidine Anaphylaxis    Other Reaction(s): Respiratory Distress   Penicillins Anaphylaxis    Has patient had a PCN reaction causing immediate rash, facial/tongue/throat  swelling, SOB or lightheadedness with hypotension: Yes  Has patient had a PCN reaction causing severe rash involving mucus membranes or skin necrosis: No  Has patient had a PCN reaction that required hospitalization No  Has patient had a PCN reaction occurring within the last 10 years: No  If all of the above answers are "NO", then may proceed with Cephalosporin use.  Other reaction(s): Unknown, Unknown  Other Reaction(s): GI Intolerance, Unknown   Sulfa Antibiotics Nausea And Vomiting    Other reaction(s): Unknown  Other Reaction(s): Unknown   Acetaminophen     Other reaction(s): Unknown Other reaction(s): Unknown   Afluria Preservative Free [Influenza Virus Vacc Split Pf]     Other reaction(s): Unknown   Aspirin     Other Reaction(s): GI Intolerance   Bisphosphonates     GI intolerance  Other reaction(s): Unknown, Unknown  Other Reaction(s): Unknown   Codeine    Cortisone     Other Reaction(s): Other (See Comments)  Reaction: Cardiovascular Arrest (ALLERGY/intolerance)   Fentanyl Other (See Comments)    "felt like I had demons in my head"  Other reaction(s): Unknown, Unknown  Other Reaction(s): Unknown   Influenza Vaccines     Other Reaction(s): Not available   Influenza Virus Vaccine Other (See Comments)    Other reaction(s): Unknown  Other Reaction(s): Unknown   Iodine     Other reaction(s): Unknown Other reaction(s): Unknown   Iohexol     Other reaction(s): Unknown, Unknown Other reaction(s): Unknown, Unknown   Levofloxacin     Other reaction(s): Unknown, Unknown  Other Reaction(s): Mental Status Changes, Unknown   Meperidine Hcl     Other reaction(s): Unknown, Unknown  Other Reaction(s): Unknown   Misc. Sulfonamide Containing Compounds     Other reaction(s): Unknown   Morphine Swelling    Other reaction(s): Unknown, Unknown  Other Reaction(s): GI Intolerance, Unknown   Other     Other Reaction(s): Not available   Oxycodone     Other  reaction(s): Unknown  Other Reaction(s): Unknown   Oxycodone Hcl     Other reaction(s): Unknown Other reaction(s): Unknown   Oxytetracycline Nausea And Vomiting    Other reaction(s): Unknown  Other Reaction(s): Not available   Penicillamine    Percocet [Oxycodone-Acetaminophen] Swelling    Mouth swelling.   Shellfish Allergy     Glucosamine not an option  Other Reaction(s): Not available   Troleandomycin Swelling    Other Reaction(s): GI Intolerance   Ciprofloxacin Nausea Only and Rash    Other reaction(s): Unknown, Unknown  Other Reaction(s): Unknown   Iodinated Contrast Media Rash    Other Reaction(s): Respiratory Distress, Unknown   Levaquin [Levofloxacin In D5w] Nausea  And Vomiting and Rash   Morphine And Codeine Rash   Oxycodone-Acetaminophen Rash    Other Reaction(s): GI Intolerance   Sulfur Rash    Other Reaction(s): GI Intolerance     Outpatient Medications Prior to Visit  Medication Sig Dispense Refill   acetaminophen (TYLENOL) 500 MG tablet Take 500 mg by mouth every 6 (six) hours as needed for mild pain or moderate pain.     Adalimumab (HUMIRA, 2 PEN,) 40 MG/0.8ML PNKT      ALPRAZolam (XANAX) 0.25 MG tablet 1 tablet     ALPRAZolam (XANAX) 0.5 MG tablet Take 0.5 mg by mouth daily.     amLODipine (NORVASC) 2.5 MG tablet Take 1 tablet by mouth daily.     benzocaine (ORAJEL) 10 % mucosal gel Use as directed 1 application in the mouth or throat as needed for mouth pain.     benzocaine (ORAJEL) 10 % mucosal gel 1 application as needed Mouth/Throat Four times a day     betamethasone dipropionate 0.05 % cream Apply topically.     BRILINTA 60 MG TABS tablet Take 60 mg by mouth daily.     busPIRone (BUSPAR) 5 MG tablet      capsicum (ZOSTRIX) 0.075 % topical cream 1 application as needed Externally Three times a day     cefTRIAXone (ROCEPHIN) 1 g injection Inject 1 g into the muscle daily.     cefUROXime (CEFTIN) 500 MG tablet      cetirizine (ZYRTEC) 10 MG tablet 1  tablet Orally Once a day for 30 day(s)     chlorhexidine (PERIDEX) 0.12 % solution Use as directed 15 mLs in the mouth or throat 2 (two) times daily.     Cholecalciferol 5000 units TABS Take 1 tablet by mouth daily.      clobetasol (TEMOVATE) 0.05 % external solution      clobetasol cream (TEMOVATE) 0.05 % Apply 1 application topically 2 (two) times daily.     clopidogrel (PLAVIX) 75 MG tablet Take 75 mg by mouth at bedtime.     Cranberry 250 MG TABS Take by mouth.     cycloSPORINE (RESTASIS) 0.05 % ophthalmic emulsion 1 drop into affected eye Ophthalmic Twice a day     desloratadine (CLARINEX) 5 MG tablet Take 5 mg by mouth every morning.      diclofenac Sodium (VOLTAREN) 1 % GEL APPLY 2 GRAMS TO THE AFFECTED AREA(S) BY TOPICAL ROUTE 4 TIMES PER DAY     dicyclomine (BENTYL) 10 MG capsule      dicyclomine (BENTYL) 20 MG tablet Take 20 mg by mouth every 6 (six) hours. Abdominal cramps     estradiol (ESTRACE) 0.1 MG/GM vaginal cream Discard plastic applicator. Insert a blueberry size amount (approximately 1 gram) of cream on fingertip inside vagina at bedtime every night for 1 week then every other night for long term use. 30 g 3   fluconazole (DIFLUCAN) 150 MG tablet      FLUoxetine (PROZAC) 20 MG capsule      fluticasone (FLONASE) 50 MCG/ACT nasal spray Place 1 spray into both nostrils daily.     folic acid (FOLVITE) 1 MG tablet Take 1 mg by mouth daily.     fosfomycin (MONUROL) 3 g PACK Take by mouth.     furosemide (LASIX) 40 MG tablet      gabapentin (NEURONTIN) 300 MG capsule Take 1 capsule (300 mg total) by mouth 3 (three) times daily. 90 capsule 3   guaiFENesin (MUCINEX) 600 MG 12 hr  tablet Take 400 mg by mouth 2 (two) times daily.     guaiFENesin (ROBITUSSIN) 100 MG/5ML liquid 10 ml as needed Orally every 4 hrs     hydrOXYzine (ATARAX) 25 MG tablet Take 25 mg by mouth at bedtime.     hydrOXYzine (VISTARIL) 25 MG capsule Take 25 mg by mouth daily as needed.     ivermectin (STROMECTOL) 3  MG TABS tablet      ketoconazole (NIZORAL) 2 % cream      ketoconazole (NIZORAL) 2 % shampoo      lansoprazole (PREVACID) 30 MG capsule Take 60 mg by mouth 2 (two) times daily.     leflunomide (ARAVA) 10 MG tablet 1 tablet     lidocaine (LIDODERM) 5 % Place 1 patch onto the skin every 12 (twelve) hours. Remove & Discard patch within 12 hours or as directed 30 patch 4   loperamide (IMODIUM A-D) 2 MG tablet Take 2 mg by mouth 4 (four) times daily as needed for diarrhea or loose stools.     Melatonin 2.5 MG CAPS Take by mouth 2 (two) times daily.     melatonin 5 MG TABS 1 tablet in the evening Orally Once a day for 30 day(s)     methocarbamol (ROBAXIN) 750 MG tablet 1 tablet Orally every 4 hrs for 30 day(s)     metoprolol tartrate (LOPRESSOR) 25 MG tablet Take 1 tablet (25 mg total) by mouth 2 (two) times daily. 60 tablet 2   mirtazapine (REMERON) 7.5 MG tablet      Multiple Vitamin (MULTIVITAMIN) tablet Take 1 tablet by mouth daily.     nitrofurantoin (MACRODANTIN) 50 MG capsule Take 1 capsule (50 mg total) by mouth at bedtime. 30 capsule 11   nitrofurantoin, macrocrystal-monohydrate, (MACROBID) 100 MG capsule Take 1 capsule (100 mg total) by mouth every 12 (twelve) hours. 14 capsule 0   nystatin (MYCOSTATIN/NYSTOP) powder Apply topically.     ondansetron (ZOFRAN) 4 MG tablet Take 4 mg by mouth every 6 (six) hours as needed for nausea or vomiting.      ondansetron (ZOFRAN-ODT) 4 MG disintegrating tablet 1 tablet on the tongue and allow to dissolve Orally Once a day for 30 day(s)     PARoxetine (PAXIL) 40 MG tablet      permethrin (ELIMITE) 5 % cream      phenazopyridine (PYRIDIUM) 100 MG tablet Take 1 tablet (100 mg total) by mouth 2 (two) times daily as needed for pain. 15 tablet 3   Potassium Chloride ER 20 MEQ TBCR Take 1 tablet by mouth daily.     predniSONE (DELTASONE) 20 MG tablet      predniSONE (DELTASONE) 5 MG tablet Take 10 mg by mouth 2 (two) times daily.     pregabalin (LYRICA) 75  MG capsule      Propylene Glycol 0.6 % SOLN as directed Ophthalmic     QUEtiapine (SEROQUEL) 25 MG tablet Take 0.5 tablets (12.5 mg total) by mouth 2 (two) times daily.     RESTASIS 0.05 % ophthalmic emulsion Place 1 drop into both eyes 2 (two) times daily.     saccharomyces boulardii (FLORASTOR) 250 MG capsule Take 250 mg by mouth 2 (two) times daily.     simethicone (MYLICON) 125 MG chewable tablet Chew 125 mg by mouth every 6 (six) hours as needed for flatulence.     sodium chloride (OCEAN) 0.65 % SOLN nasal spray Place 1 spray into both nostrils as needed for congestion. (Patient taking differently: Place  1 spray into both nostrils daily as needed for congestion.)     sulfamethoxazole-trimethoprim (BACTRIM DS) 800-160 MG tablet Take 1 tablet by mouth 2 (two) times daily. 14 tablet 0   terbinafine (LAMISIL) 250 MG tablet Take 250 mg by mouth daily.     ticagrelor (BRILINTA) 60 MG TABS tablet 1 tablet Orally Twice a day for 30 day(s)     ticagrelor (BRILINTA) 90 MG TABS tablet Take by mouth 2 (two) times daily.     tiZANidine (ZANAFLEX) 4 MG tablet      traMADol (ULTRAM) 50 MG tablet Take 1 tablet (50 mg total) by mouth every 8 (eight) hours as needed. 90 tablet 2   traZODone (DESYREL) 50 MG tablet Take 50 mg by mouth at bedtime.     valACYclovir (VALTREX) 1000 MG tablet      No facility-administered medications prior to visit.         Objective:   Physical Exam  Vitals:   01/27/23 1416  BP: 104/60  Pulse: 69  SpO2: 99%  Weight: 152 lb (68.9 kg)  Height: 5\' 6"  (1.676 m)    Gen: Pleasant, ill-appearing woman, in no distress,  normal affect, in a wheelchair  ENT: No lesions,  mouth clear,  oropharynx clear, no postnasal drip  Neck: No JVD, no stridor  Lungs: No use of accessory muscles, no crackles or wheezing on normal respiration, no wheeze on forced expiration  Cardiovascular: RRR, heart sounds normal, no murmur or gallops, no peripheral edema  Musculoskeletal: Some  deformities of her DIP and PIP joints bilateral hands  Neuro: alert, awake, non focal  Skin: Warm, no lesions or rash     Assessment & Plan:  Pulmonary nodules Reviewed her CT scans from 10//23, 08/22/2022, 11/08/2022.  These show waxing and waning rounded pulmonary nodules without any tree-in-bud change.  They have largely resolved spontaneously.  The left lower lobe nodule is gone, the right middle lobe nodule has decreased in size significantly with now scarlike change.  I do not see any evidence for active inflammatory or infectious process.  This is almost certainly due to her rheumatoid.  I believe it would be appropriate for her to start Humira if her rheumatologist thinks it would be beneficial.  We will do a surveillance CT chest in 1 year  Acute bronchitis She has postnasal drainage and a sore throat, cough that is white to yellow.  I will treat her with azithromycin for possible mild bronchitis.  She will restart her fluticasone nasal spray, 2 sprays each nostril once daily instead of as needed.   Levy Pupa, MD, PhD 01/27/2023, 2:54 PM [facility] [phone] or if no answer before 7:00PM call [phone] For any issues after 7:00PM please call eLink [phone]

## 2023-01-27 NOTE — Assessment & Plan Note (Signed)
Reviewed her CT scans from 10//23, 08/22/2022, 11/08/2022.  These show waxing and waning rounded pulmonary nodules without any tree-in-bud change.  They have largely resolved spontaneously.  The left lower lobe nodule is gone, the right middle lobe nodule has decreased in size significantly with now scarlike change.  I do not see any evidence for active inflammatory or infectious process.  This is almost certainly due to her rheumatoid.  I believe it would be appropriate for her to start Humira if her rheumatologist thinks it would be beneficial.  We will do a surveillance CT chest in 1 year

## 2023-01-27 NOTE — Patient Instructions (Addendum)
We reviewed your CT scans of the chest today, including your most recent scan from 11/08/2022.  Your small pulmonary nodules have almost fully resolved.  They are likely due to rheumatoid arthritis.  There are no signs of active or underlying infection that would preclude you from initiating Humira if this is recommended by your rheumatologist. It would be reasonable to repeat your CT scan of the chest in 1 year to assess for interval change or interval stability.  We will arrange for this. Please take azithromycin as directed until completely gone to treat mild bronchitis. Continue your steroid nasal spray 2 sprays each nostril once daily every day.

## 2023-01-27 NOTE — Telephone Encounter (Signed)
Pt was seen today in the office with Dr.Byrum today 01/27/23   Pt requested for OV notes to be send to her Rheumatologist. This has been sent, NFN at this time

## 2023-01-27 NOTE — Assessment & Plan Note (Signed)
She has postnasal drainage and a sore throat, cough that is white to yellow.  I will treat her with azithromycin for possible mild bronchitis.  She will restart her fluticasone nasal spray, 2 sprays each nostril once daily instead of as needed.

## 2023-01-28 ENCOUNTER — Telehealth: Payer: Self-pay

## 2023-01-28 DIAGNOSIS — J069 Acute upper respiratory infection, unspecified: Secondary | ICD-10-CM | POA: Diagnosis not present

## 2023-01-28 DIAGNOSIS — R911 Solitary pulmonary nodule: Secondary | ICD-10-CM | POA: Diagnosis not present

## 2023-01-28 NOTE — Telephone Encounter (Signed)
Patient states that she hurts when voiding and burning symptoms when she is not voiding. Patient states that when the estrance is inserted that she notice that she is having burning and stinging but goes away after some time. Patient states that when she has to voided she has to goes immediately. Patient is aware a message will be sent to Sarah.

## 2023-01-28 NOTE — Telephone Encounter (Signed)
Patient is made aware of Sarah recommendation. Patient states that she went to her pulmonary on yesterday and there was an infection found in her lungs. Patient states MD put her on Zithromax for the infection and she is concern about taking two ABT. Patient is willing to take Bactrim along with nausea medication PRN, patient states that she can not take Keflex. Patient states that she hasn't started taking the Zithromax yet. Patient wants to know should she keep her appointment with Maralyn Sago on 09/04. Patient is aware a message will be sent to Maralyn Sago on recommendation and someone will reach back out with Maralyn Sago recommendation. Voiced understanding   1. Please advise patient: Topical vaginal estrogen replacement may sting/burn initially due to severe dryness, which will improve with ongoing treatment. Will take about 3 months to restore the vaginal pH & vaginal mucosal health.  2. She is currently symptomatic with dysuria and increased urinary urgency per phone note. - The urine culture on 01/16/2023 was positive for >100k Klebsiella pneumoniae ESBL; multi-drug resistant organism with limited antibiotic sensitivities. - Treatment options are further limited due to patient's multiple antibiotic allergies / contraindications including Keflex, penicillin, sulfa drugs, Levaquin, Cipro. - Based on culture sensitivities, the two oral antibiotic options are either Bactrim (a sulfa drug) or Keflex. She has listed contraindications to both of those however (nausea / vomiting). - Please ask patient if she thinks she could tolerate one of those antibiotics; could send in something for nausea PRN if needed. If she can't take Bactrim or Keflex then she will need to seek treatment from Infectious Disease or go to ER for IV antibiotics if her UTI symptoms persist / become severe.

## 2023-01-28 NOTE — Telephone Encounter (Signed)
-----   Message from Donnita Falls sent at 01/27/2023  4:13 PM EDT ----- Per scanned records from [facility] - 01/16/2023: I&O cath; urine culture positive for >100k Klebsiella pneumoniae ESBL  Is she having acute UTI symptoms at this time? ----- Message ----- From: Raquel Sarna Sent: 01/27/2023   1:04 PM EDT To: Donnita Falls, FNP  Please review.

## 2023-01-29 ENCOUNTER — Other Ambulatory Visit: Payer: Self-pay | Admitting: Urology

## 2023-01-29 DIAGNOSIS — N390 Urinary tract infection, site not specified: Secondary | ICD-10-CM

## 2023-01-29 MED ORDER — SULFAMETHOXAZOLE-TRIMETHOPRIM 800-160 MG PO TABS
1.0000 | ORAL_TABLET | Freq: Two times a day (BID) | ORAL | 0 refills | Status: DC
Start: 2023-01-29 — End: 2023-02-13

## 2023-01-29 MED ORDER — ONDANSETRON 4 MG PO TBDP
4.0000 mg | ORAL_TABLET | Freq: Three times a day (TID) | ORAL | 0 refills | Status: DC | PRN
Start: 2023-01-29 — End: 2023-12-23

## 2023-01-29 NOTE — Telephone Encounter (Signed)
Brittney w/[facility] left a voice message 01-29-2023.  They will need an order for antibiotics prescribed to patient before administering Rx.  Please advise.  Fax:  [phone]

## 2023-01-30 NOTE — Telephone Encounter (Signed)
Rx faxed

## 2023-02-03 DIAGNOSIS — F411 Generalized anxiety disorder: Secondary | ICD-10-CM | POA: Diagnosis not present

## 2023-02-04 DIAGNOSIS — G894 Chronic pain syndrome: Secondary | ICD-10-CM | POA: Diagnosis not present

## 2023-02-05 NOTE — Progress Notes (Signed)
Name: Robin Blackburn DOB: 08/05/1945 MRN: [id number]  History of Present Illness: Ms. Pough is a 77 y.o. female who presents today for follow up visit at [facility]. She is accompanied by Servando Salina, CNA. She denies having a medical power of attorney.  - Past medical history includes cognitive communication deficit / prior stroke. She is a long term resident at [facility]. - GU history: 1. Recurrent UTI. - Risk factors include chronic immunosuppression with Humira for RA, IBS with diarrhea, ambulatory dysfunction, incontinence / soiling, vaginal atrophy. - Taking Nitrofurantoin 50 mg nightly for UTI prophylaxis. 2. Vaginal atrophy with significantly narrowed vaginal introitus (per pt report).  - Using topical vaginal estrogen cream every other night.  3. OAB with urinary frequency, urgency, and urge incontinence.  - Previously took Oxybutynin XL 5 mg daily; discontinued due to incomplete bladder emptying. 4. Bladder diverticulum (multiple).  - Per CT on 03/12/2022.  At last visit on 12/30/2022: - PVR: 127 ml  - Advised to continue Nitrofurantoin daily and topical vaginal estrogen cream every other night.   Since last visit: - 01/16/2023: I&O cath; urine culture positive for >100k Klebsiella pneumoniae ESBL (per scanned records from [facility]); was prescribed Bactrim  Today: She reports some bladder spasms, some mild bilateral lower abdominal pain, bladder pressure, bilateral low back pain (R > L), and dysuria. States these symptoms have improved somewhat since starting Bactrim - she has 2 or 3 days left. Also reports relief with Pyridium.  She reports using topical vaginal estrogen cream routinely every other night (confirmed on med list from SNF).   Fall Screening: Do you usually have a device to assist in your mobility? Yes - wheelchair  Medications: Current Outpatient Medications  Medication Sig Dispense Refill   nitrofurantoin (MACRODANTIN) 50 MG  capsule Take 1 capsule (50 mg total) by mouth at bedtime. 30 capsule 11   sulfamethoxazole-trimethoprim (BACTRIM DS) 800-160 MG tablet Take 1 tablet by mouth every 12 (twelve) hours. 14 tablet 0   acetaminophen (TYLENOL) 500 MG tablet Take 500 mg by mouth every 6 (six) hours as needed for mild pain or moderate pain.     Adalimumab (HUMIRA, 2 PEN,) 40 MG/0.8ML PNKT      ALPRAZolam (XANAX) 0.25 MG tablet 1 tablet     ALPRAZolam (XANAX) 0.5 MG tablet Take 0.5 mg by mouth daily.     amLODipine (NORVASC) 2.5 MG tablet Take 1 tablet by mouth daily.     azithromycin (ZITHROMAX) 250 MG tablet Take 2 pills on the first day, then 1 pill daily until completely gone. 6 tablet 0   benzocaine (ORAJEL) 10 % mucosal gel Use as directed 1 application in the mouth or throat as needed for mouth pain.     benzocaine (ORAJEL) 10 % mucosal gel 1 application as needed Mouth/Throat Four times a day     betamethasone dipropionate 0.05 % cream Apply topically.     BRILINTA 60 MG TABS tablet Take 60 mg by mouth daily.     busPIRone (BUSPAR) 5 MG tablet      capsicum (ZOSTRIX) 0.075 % topical cream 1 application as needed Externally Three times a day     cetirizine (ZYRTEC) 10 MG tablet 1 tablet Orally Once a day for 30 day(s)     chlorhexidine (PERIDEX) 0.12 % solution Use as directed 15 mLs in the mouth or throat 2 (two) times daily.     Cholecalciferol 5000 units TABS Take 1 tablet by mouth daily.  clobetasol (TEMOVATE) 0.05 % external solution      clobetasol cream (TEMOVATE) 0.05 % Apply 1 application topically 2 (two) times daily.     clopidogrel (PLAVIX) 75 MG tablet Take 75 mg by mouth at bedtime.     Cranberry 250 MG TABS Take by mouth.     cycloSPORINE (RESTASIS) 0.05 % ophthalmic emulsion 1 drop into affected eye Ophthalmic Twice a day     desloratadine (CLARINEX) 5 MG tablet Take 5 mg by mouth every morning.      diclofenac Sodium (VOLTAREN) 1 % GEL APPLY 2 GRAMS TO THE AFFECTED AREA(S) BY TOPICAL ROUTE 4  TIMES PER DAY     dicyclomine (BENTYL) 10 MG capsule      dicyclomine (BENTYL) 20 MG tablet Take 20 mg by mouth every 6 (six) hours. Abdominal cramps     estradiol (ESTRACE) 0.1 MG/GM vaginal cream Discard plastic applicator. Insert a blueberry size amount (approximately 1 gram) of cream on fingertip inside vagina at bedtime every night for 1 week then every other night for long term use. 30 g 3   fluconazole (DIFLUCAN) 150 MG tablet      FLUoxetine (PROZAC) 20 MG capsule      fluticasone (FLONASE) 50 MCG/ACT nasal spray Place 1 spray into both nostrils daily.     folic acid (FOLVITE) 1 MG tablet Take 1 mg by mouth daily.     furosemide (LASIX) 40 MG tablet      gabapentin (NEURONTIN) 300 MG capsule Take 1 capsule (300 mg total) by mouth 3 (three) times daily. 90 capsule 3   guaiFENesin (MUCINEX) 600 MG 12 hr tablet Take 400 mg by mouth 2 (two) times daily.     guaiFENesin (ROBITUSSIN) 100 MG/5ML liquid 10 ml as needed Orally every 4 hrs     hydrOXYzine (ATARAX) 25 MG tablet Take 25 mg by mouth at bedtime.     hydrOXYzine (VISTARIL) 25 MG capsule Take 25 mg by mouth daily as needed.     ivermectin (STROMECTOL) 3 MG TABS tablet      ketoconazole (NIZORAL) 2 % cream      ketoconazole (NIZORAL) 2 % shampoo      lansoprazole (PREVACID) 30 MG capsule Take 60 mg by mouth 2 (two) times daily.     leflunomide (ARAVA) 10 MG tablet 1 tablet     lidocaine (LIDODERM) 5 % Place 1 patch onto the skin every 12 (twelve) hours. Remove & Discard patch within 12 hours or as directed 30 patch 4   loperamide (IMODIUM A-D) 2 MG tablet Take 2 mg by mouth 4 (four) times daily as needed for diarrhea or loose stools.     Melatonin 2.5 MG CAPS Take by mouth 2 (two) times daily.     melatonin 5 MG TABS 1 tablet in the evening Orally Once a day for 30 day(s)     methocarbamol (ROBAXIN) 750 MG tablet 1 tablet Orally every 4 hrs for 30 day(s)     metoprolol tartrate (LOPRESSOR) 25 MG tablet Take 1 tablet (25 mg total) by  mouth 2 (two) times daily. 60 tablet 2   mirtazapine (REMERON) 7.5 MG tablet      Multiple Vitamin (MULTIVITAMIN) tablet Take 1 tablet by mouth daily.     nystatin (MYCOSTATIN/NYSTOP) powder Apply topically.     ondansetron (ZOFRAN) 4 MG tablet Take 4 mg by mouth every 6 (six) hours as needed for nausea or vomiting.      ondansetron (ZOFRAN-ODT) 4 MG disintegrating tablet  Take 1 tablet (4 mg total) by mouth every 8 (eight) hours as needed for nausea or vomiting. 30 tablet 0   PARoxetine (PAXIL) 40 MG tablet      permethrin (ELIMITE) 5 % cream      phenazopyridine (PYRIDIUM) 100 MG tablet Take 1 tablet (100 mg total) by mouth 2 (two) times daily as needed for pain. 15 tablet 3   Potassium Chloride ER 20 MEQ TBCR Take 1 tablet by mouth daily.     predniSONE (DELTASONE) 20 MG tablet      predniSONE (DELTASONE) 5 MG tablet Take 10 mg by mouth 2 (two) times daily.     pregabalin (LYRICA) 75 MG capsule      Propylene Glycol 0.6 % SOLN as directed Ophthalmic     QUEtiapine (SEROQUEL) 25 MG tablet Take 0.5 tablets (12.5 mg total) by mouth 2 (two) times daily.     RESTASIS 0.05 % ophthalmic emulsion Place 1 drop into both eyes 2 (two) times daily.     saccharomyces boulardii (FLORASTOR) 250 MG capsule Take 250 mg by mouth 2 (two) times daily.     simethicone (MYLICON) 125 MG chewable tablet Chew 125 mg by mouth every 6 (six) hours as needed for flatulence.     sodium chloride (OCEAN) 0.65 % SOLN nasal spray Place 1 spray into both nostrils as needed for congestion. (Patient taking differently: Place 1 spray into both nostrils daily as needed for congestion.)     terbinafine (LAMISIL) 250 MG tablet Take 250 mg by mouth daily.     ticagrelor (BRILINTA) 60 MG TABS tablet 1 tablet Orally Twice a day for 30 day(s)     ticagrelor (BRILINTA) 90 MG TABS tablet Take by mouth 2 (two) times daily.     tiZANidine (ZANAFLEX) 4 MG tablet      traMADol (ULTRAM) 50 MG tablet Take 1 tablet (50 mg total) by mouth every  8 (eight) hours as needed. 90 tablet 2   traZODone (DESYREL) 50 MG tablet Take 50 mg by mouth at bedtime.     valACYclovir (VALTREX) 1000 MG tablet      No current facility-administered medications for this visit.    Allergies: Allergies  Allergen Reactions   Hydromorphone Anaphylaxis    Other reaction(s): Unknown  Other Reaction(s): GI Intolerance, Unknown   Keflex [Cephalexin] Nausea And Vomiting   Meperidine Anaphylaxis    Other Reaction(s): Respiratory Distress   Penicillins Anaphylaxis    Has patient had a PCN reaction causing immediate rash, facial/tongue/throat swelling, SOB or lightheadedness with hypotension: Yes  Has patient had a PCN reaction causing severe rash involving mucus membranes or skin necrosis: No  Has patient had a PCN reaction that required hospitalization No  Has patient had a PCN reaction occurring within the last 10 years: No  If all of the above answers are "NO", then may proceed with Cephalosporin use.  Other reaction(s): Unknown, Unknown  Other Reaction(s): GI Intolerance, Unknown   Sulfa Antibiotics Nausea And Vomiting    Tolerates Bactrim though   Acetaminophen     Other reaction(s): Unknown Other reaction(s): Unknown   Afluria Preservative Free [Influenza Virus Vacc Split Pf]     Other reaction(s): Unknown   Aspirin     Other Reaction(s): GI Intolerance   Bisphosphonates     GI intolerance  Other reaction(s): Unknown, Unknown  Other Reaction(s): Unknown   Codeine    Cortisone     Other Reaction(s): Other (See Comments)  Reaction: Cardiovascular Arrest (ALLERGY/intolerance)  Fentanyl Other (See Comments)    "felt like I had demons in my head"  Other reaction(s): Unknown, Unknown  Other Reaction(s): Unknown   Influenza Vaccines     Other Reaction(s): Not available   Influenza Virus Vaccine Other (See Comments)    Other reaction(s): Unknown  Other Reaction(s): Unknown   Iodine     Other reaction(s): Unknown Other  reaction(s): Unknown   Iohexol     Other reaction(s): Unknown, Unknown Other reaction(s): Unknown, Unknown   Levofloxacin     Other reaction(s): Unknown, Unknown  Other Reaction(s): Mental Status Changes, Unknown   Meperidine Hcl     Other reaction(s): Unknown, Unknown  Other Reaction(s): Unknown   Misc. Sulfonamide Containing Compounds     Other reaction(s): Unknown   Morphine Swelling    Other reaction(s): Unknown, Unknown  Other Reaction(s): GI Intolerance, Unknown   Other     Other Reaction(s): Not available   Oxycodone     Other reaction(s): Unknown  Other Reaction(s): Unknown   Oxycodone Hcl     Other reaction(s): Unknown Other reaction(s): Unknown   Oxytetracycline Nausea And Vomiting    Other reaction(s): Unknown  Other Reaction(s): Not available   Penicillamine    Percocet [Oxycodone-Acetaminophen] Swelling    Mouth swelling.   Shellfish Allergy     Glucosamine not an option  Other Reaction(s): Not available   Troleandomycin Swelling    Other Reaction(s): GI Intolerance   Ciprofloxacin Nausea Only and Rash    Other reaction(s): Unknown, Unknown  Other Reaction(s): Unknown   Iodinated Contrast Media Rash    Other Reaction(s): Respiratory Distress, Unknown   Levaquin [Levofloxacin In D5w] Nausea And Vomiting and Rash   Morphine And Codeine Rash   Oxycodone-Acetaminophen Rash    Other Reaction(s): GI Intolerance   Sulfur Rash    Other Reaction(s): GI Intolerance    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   Depression    Fibromyalgia    Hypertension    Neuropathy    Rheumatoid arthritis (HCC)    Stroke (HCC) 02/17/14   Past Surgical History:  Procedure Laterality Date   ABDOMINAL HYSTERECTOMY     ANKLE RECONSTRUCTION     APPENDECTOMY     BACK SURGERY     CHOLECYSTECTOMY     KNEE SURGERY     Family History  Problem Relation Age of Onset   Hypertension  Father    Transient ischemic attack Father    Hypertension Brother    Leukemia Brother    CVA Maternal Grandfather    Social History   Socioeconomic History   Marital status: Divorced    Spouse name: Not on file   Number of children: 0   Years of education: 12   Highest education level: 12th grade  Occupational History   Not on file  Tobacco Use   Smoking status: Never    Passive exposure: Never   Smokeless tobacco: Never  Vaping Use   Vaping status: Never Used  Substance and Sexual Activity   Alcohol use: No   Drug use: No   Sexual activity: Not Currently  Other Topics Concern   Not on file  Social History Narrative   Not on file   Social Determinants of Health   Financial Resource Strain: Low Risk  (01/06/2022)   Overall Financial Resource Strain (CARDIA)    Difficulty of Paying Living Expenses: Not  hard at all  Food Insecurity: No Food Insecurity (01/06/2022)   Hunger Vital Sign    Worried About Running Out of Food in the Last Year: Never true    Ran Out of Food in the Last Year: Never true  Transportation Needs: No Transportation Needs (01/06/2022)   PRAPARE - Administrator, Civil Service (Medical): No    Lack of Transportation (Non-Medical): No  Physical Activity: Inactive (11/07/2022)   [facility], [facility]   Exercise Vital Sign    Days of Exercise per Week: 0 days    Minutes of Exercise per Session: 0 min  Stress: No Stress Concern Present (01/06/2022)   [employer] of Occupational Health - Occupational Stress Questionnaire    Feeling of Stress : Not at all  Social Connections: Unknown (11/06/2022)   Received from [facility], [facility]   Social Network    Social Network: Not on file  Intimate Partner Violence: Unknown (11/06/2022)   Received from [facility], [facility]   HITS    Physically Hurt: Not on file    Insult or Talk Down To: Not on file    Threaten Physical Harm: Not on file    Scream or  Curse: Not on file    Review of Systems Constitutional: Patient denies any unintentional weight loss or change in strength lntegumentary: Patient denies any rashes or pruritus Cardiovascular: Patient denies chest pain or syncope Respiratory: Patient denies shortness of breath Gastrointestinal: Patient denies nausea, vomiting, constipation, or diarrhea Musculoskeletal: Patient denies muscle cramps or weakness Neurologic: Patient denies convulsions or seizures Psychiatric: Patient reports some memory problems Allergic/Immunologic: Patient denies recent allergic reaction(s) Hematologic/Lymphatic: Patient denies bleeding tendencies Endocrine: Patient denies heat/cold intolerance  GU: As per HPI.  OBJECTIVE Vitals:   02/10/23 1342  BP: 128/78  Pulse: 69  Temp: 98.6 F (37 C)   There is no height or weight on file to calculate BMI.  Physical Examination Constitutional: No obvious distress; patient is non-toxic appearing  Cardiovascular: No visible lower extremity edema.  Respiratory: The patient does not have audible wheezing/stridor; respirations do not appear labored  Gastrointestinal: Abdomen non-distended Musculoskeletal: Normal ROM of UEs  Skin: No obvious rashes/open sores  Neurologic: CN 2-12 grossly intact Psychiatric: Answered questions appropriately with normal affect  Hematologic/Lymphatic/Immunologic: No obvious bruises or sites of spontaneous bleeding  UA: >30 WBC/hpf, >30 RBC/hpf, bacteria (many) PVR: 136 ml  ASSESSMENT Recurrent UTI - Plan: Urinalysis, Routine w reflex microscopic, BLADDER SCAN AMB NON-IMAGING, Ambulatory referral to Urology  OAB (overactive bladder) - Plan: Ambulatory referral to Urology  H/O: CVA (cerebrovascular accident) - Plan: Ambulatory referral to Urology  Spinal stenosis of lumbar region, unspecified whether neurogenic claudication present - Plan: Ambulatory referral to Urology  Weakness - Plan: Ambulatory referral to  Urology  Advised repeat urine culture today due to persistent UTI symptoms. In the meantime she was advised to complete Bactrim as prescribed and to then restart Nitrofurantoin 50 mg daily for UTI prevention along with continuation of cranberry supplement daily and topical vaginal estrogen cream every other night.  We discussed importance of limiting Pyridium (phenazopyridine) use to 3 days consecutively due to risk for liver injury, methemoglobinemia, and damage to bone health.  Advised to avoid caffeine for OAB symptoms.   Advised cystoscopy for further evaluation due to complex recurrent UTI history with evidence of bladder diverticuli on prior CT; she strongly prefers to be seen by Dr. Vernie Ammons at [facility] for that therefore  referral placed. Pt verbalized understanding and agreement. All questions were answered.  PLAN Advised the following: 1. Urine culture. 2. Complete Bactrim as prescribed then restart Nitrofurantoin 50 mg daily.  3. Continue cranberry supplement daily 4. Continue topical vaginal estrogen cream every other night. 5.  5. Return for f/u with [facility] for cystoscopy (patient wants Dr. Vernie Ammons).  Orders Placed This Encounter  Procedures   Urinalysis, Routine w reflex microscopic   Ambulatory referral to Urology    Referral Priority:   Routine    Referral Type:   Consultation    Referral Reason:   Specialty Services Required    Requested Specialty:   Urology    Number of Visits Requested:   1   BLADDER SCAN AMB NON-IMAGING   It has been explained that the patient is to follow regularly with their PCP in addition to all other providers involved in their care and to follow instructions provided by these respective offices. Patient advised to contact urology clinic if any urologic-pertaining questions, concerns, new symptoms or problems arise in the interim period.  Total time spent caring for the patient today was over 30 minutes. This includes time spent  on the date of the visit reviewing the patient's chart before the visit, time spent during the visit, and time spent after the visit on documentation. Over 50% of that time was spent in face-to-face time with this patient for direct counseling. E&M based on time and complexity of medical decision making.   There are no Patient Instructions on file for this visit.  Electronically signed by:  Donnita Falls, FNP   02/10/23    3:16 PM

## 2023-02-09 DIAGNOSIS — R278 Other lack of coordination: Secondary | ICD-10-CM | POA: Diagnosis not present

## 2023-02-09 DIAGNOSIS — G894 Chronic pain syndrome: Secondary | ICD-10-CM | POA: Diagnosis not present

## 2023-02-09 DIAGNOSIS — M797 Fibromyalgia: Secondary | ICD-10-CM | POA: Diagnosis not present

## 2023-02-09 DIAGNOSIS — M6281 Muscle weakness (generalized): Secondary | ICD-10-CM | POA: Diagnosis not present

## 2023-02-09 DIAGNOSIS — M069 Rheumatoid arthritis, unspecified: Secondary | ICD-10-CM | POA: Diagnosis not present

## 2023-02-10 ENCOUNTER — Ambulatory Visit (INDEPENDENT_AMBULATORY_CARE_PROVIDER_SITE_OTHER): Payer: Medicare PPO | Admitting: Urology

## 2023-02-10 ENCOUNTER — Encounter: Payer: Self-pay | Admitting: Urology

## 2023-02-10 VITALS — BP 128/78 | HR 69 | Temp 98.6°F

## 2023-02-10 DIAGNOSIS — R300 Dysuria: Secondary | ICD-10-CM

## 2023-02-10 DIAGNOSIS — M48061 Spinal stenosis, lumbar region without neurogenic claudication: Secondary | ICD-10-CM

## 2023-02-10 DIAGNOSIS — N3281 Overactive bladder: Secondary | ICD-10-CM

## 2023-02-10 DIAGNOSIS — M545 Low back pain, unspecified: Secondary | ICD-10-CM

## 2023-02-10 DIAGNOSIS — R1030 Lower abdominal pain, unspecified: Secondary | ICD-10-CM

## 2023-02-10 DIAGNOSIS — Z87440 Personal history of urinary (tract) infections: Secondary | ICD-10-CM | POA: Diagnosis not present

## 2023-02-10 DIAGNOSIS — N390 Urinary tract infection, site not specified: Secondary | ICD-10-CM | POA: Diagnosis not present

## 2023-02-10 DIAGNOSIS — M797 Fibromyalgia: Secondary | ICD-10-CM | POA: Diagnosis not present

## 2023-02-10 DIAGNOSIS — N3289 Other specified disorders of bladder: Secondary | ICD-10-CM

## 2023-02-10 DIAGNOSIS — M069 Rheumatoid arthritis, unspecified: Secondary | ICD-10-CM | POA: Diagnosis not present

## 2023-02-10 DIAGNOSIS — M6281 Muscle weakness (generalized): Secondary | ICD-10-CM | POA: Diagnosis not present

## 2023-02-10 DIAGNOSIS — R278 Other lack of coordination: Secondary | ICD-10-CM | POA: Diagnosis not present

## 2023-02-10 DIAGNOSIS — R531 Weakness: Secondary | ICD-10-CM

## 2023-02-10 DIAGNOSIS — Z8673 Personal history of transient ischemic attack (TIA), and cerebral infarction without residual deficits: Secondary | ICD-10-CM

## 2023-02-10 DIAGNOSIS — G894 Chronic pain syndrome: Secondary | ICD-10-CM | POA: Diagnosis not present

## 2023-02-10 LAB — BLADDER SCAN AMB NON-IMAGING: Scan Result: 136

## 2023-02-10 NOTE — Addendum Note (Signed)
Addended by: Gustavus Messing on: 02/10/2023 03:48 PM   Modules accepted: Orders

## 2023-02-10 NOTE — Progress Notes (Signed)
post void residual=136 ?

## 2023-02-11 DIAGNOSIS — M797 Fibromyalgia: Secondary | ICD-10-CM | POA: Diagnosis not present

## 2023-02-11 DIAGNOSIS — G894 Chronic pain syndrome: Secondary | ICD-10-CM | POA: Diagnosis not present

## 2023-02-11 DIAGNOSIS — M069 Rheumatoid arthritis, unspecified: Secondary | ICD-10-CM | POA: Diagnosis not present

## 2023-02-11 DIAGNOSIS — R278 Other lack of coordination: Secondary | ICD-10-CM | POA: Diagnosis not present

## 2023-02-11 DIAGNOSIS — M6281 Muscle weakness (generalized): Secondary | ICD-10-CM | POA: Diagnosis not present

## 2023-02-11 LAB — URINALYSIS, ROUTINE W REFLEX MICROSCOPIC
Bilirubin, UA: NEGATIVE
Ketones, UA: NEGATIVE
Nitrite, UA: POSITIVE — AB
Specific Gravity, UA: 1.025 (ref 1.005–1.030)
Urobilinogen, Ur: 4 mg/dL — ABNORMAL HIGH (ref 0.2–1.0)
pH, UA: 5 (ref 5.0–7.5)

## 2023-02-11 LAB — MICROSCOPIC EXAMINATION
RBC, Urine: >30 /HPF — AB (ref 0–2)
WBC, UA: >30 /HPF — AB (ref 0–5)

## 2023-02-12 ENCOUNTER — Telehealth: Payer: Self-pay

## 2023-02-12 DIAGNOSIS — N390 Urinary tract infection, site not specified: Secondary | ICD-10-CM

## 2023-02-12 DIAGNOSIS — R278 Other lack of coordination: Secondary | ICD-10-CM | POA: Diagnosis not present

## 2023-02-12 DIAGNOSIS — F332 Major depressive disorder, recurrent severe without psychotic features: Secondary | ICD-10-CM | POA: Diagnosis not present

## 2023-02-12 DIAGNOSIS — I10 Essential (primary) hypertension: Secondary | ICD-10-CM | POA: Diagnosis not present

## 2023-02-12 DIAGNOSIS — Z8673 Personal history of transient ischemic attack (TIA), and cerebral infarction without residual deficits: Secondary | ICD-10-CM | POA: Diagnosis not present

## 2023-02-12 DIAGNOSIS — F419 Anxiety disorder, unspecified: Secondary | ICD-10-CM | POA: Diagnosis not present

## 2023-02-12 DIAGNOSIS — M069 Rheumatoid arthritis, unspecified: Secondary | ICD-10-CM | POA: Diagnosis not present

## 2023-02-12 DIAGNOSIS — M797 Fibromyalgia: Secondary | ICD-10-CM | POA: Diagnosis not present

## 2023-02-12 DIAGNOSIS — G894 Chronic pain syndrome: Secondary | ICD-10-CM | POA: Diagnosis not present

## 2023-02-12 DIAGNOSIS — N3281 Overactive bladder: Secondary | ICD-10-CM | POA: Diagnosis not present

## 2023-02-12 DIAGNOSIS — M6281 Muscle weakness (generalized): Secondary | ICD-10-CM | POA: Diagnosis not present

## 2023-02-12 NOTE — Telephone Encounter (Signed)
Patient called wanting to let you know that she thought she was taking Bactrim but had completed it 3 days prior. Patient states she is having UTI symptoms and request more Bactrim.

## 2023-02-13 ENCOUNTER — Other Ambulatory Visit: Payer: Self-pay | Admitting: Urology

## 2023-02-13 ENCOUNTER — Telehealth: Payer: Self-pay

## 2023-02-13 DIAGNOSIS — M797 Fibromyalgia: Secondary | ICD-10-CM | POA: Diagnosis not present

## 2023-02-13 DIAGNOSIS — F411 Generalized anxiety disorder: Secondary | ICD-10-CM | POA: Diagnosis not present

## 2023-02-13 DIAGNOSIS — M069 Rheumatoid arthritis, unspecified: Secondary | ICD-10-CM | POA: Diagnosis not present

## 2023-02-13 DIAGNOSIS — R278 Other lack of coordination: Secondary | ICD-10-CM | POA: Diagnosis not present

## 2023-02-13 DIAGNOSIS — M6281 Muscle weakness (generalized): Secondary | ICD-10-CM | POA: Diagnosis not present

## 2023-02-13 DIAGNOSIS — N390 Urinary tract infection, site not specified: Secondary | ICD-10-CM

## 2023-02-13 DIAGNOSIS — G894 Chronic pain syndrome: Secondary | ICD-10-CM | POA: Diagnosis not present

## 2023-02-13 MED ORDER — SULFAMETHOXAZOLE-TRIMETHOPRIM 800-160 MG PO TABS: 1.0000 | ORAL_TABLET | Freq: Two times a day (BID) | ORAL | 0 refills | Status: DC

## 2023-02-13 MED ORDER — SULFAMETHOXAZOLE-TRIMETHOPRIM 800-160 MG PO TABS
1.0000 | ORAL_TABLET | Freq: Two times a day (BID) | ORAL | 0 refills | Status: DC
Start: 2023-02-13 — End: 2023-02-13

## 2023-02-13 NOTE — Telephone Encounter (Signed)
Can you please contact patient. Thanks.

## 2023-02-13 NOTE — Telephone Encounter (Addendum)
Patient states that she is having a hard time voiding, fever, and at the end of her urine stream she is feeling something  sticking her. Patient is aware a message will be sent to Sarah on recommendation. Patient voiced understanding. Brenna front desk staff was made aware Rx will be faxed over to facility.  Brenna voiced understanding.

## 2023-02-13 NOTE — Telephone Encounter (Signed)
Patient wanted to let Evette Georges, NP know she has not been taking antibiotic.  She thought was taking it.  She found out it wasn't included in her medications.  Wanting to know what Maralyn Sago would suggest.  Please advise as soon as possible.  Call:  [phone]

## 2023-02-13 NOTE — Telephone Encounter (Signed)
Patient is made aware of Sarah recommendation "If fever or difficulty voiding persist / worsen, advised to go to ER for urgent evaluation. She has multiple antibiotic allergies so it's possible she may require IV antibiotics. " patient voiced understanding.

## 2023-02-14 LAB — URINE CULTURE

## 2023-02-16 ENCOUNTER — Telehealth: Payer: Self-pay

## 2023-02-16 ENCOUNTER — Other Ambulatory Visit: Payer: Self-pay | Admitting: Urology

## 2023-02-16 ENCOUNTER — Other Ambulatory Visit: Payer: Self-pay

## 2023-02-16 DIAGNOSIS — M797 Fibromyalgia: Secondary | ICD-10-CM | POA: Diagnosis not present

## 2023-02-16 DIAGNOSIS — N390 Urinary tract infection, site not specified: Secondary | ICD-10-CM

## 2023-02-16 DIAGNOSIS — M069 Rheumatoid arthritis, unspecified: Secondary | ICD-10-CM | POA: Diagnosis not present

## 2023-02-16 DIAGNOSIS — R278 Other lack of coordination: Secondary | ICD-10-CM | POA: Diagnosis not present

## 2023-02-16 DIAGNOSIS — G894 Chronic pain syndrome: Secondary | ICD-10-CM | POA: Diagnosis not present

## 2023-02-16 DIAGNOSIS — M6281 Muscle weakness (generalized): Secondary | ICD-10-CM | POA: Diagnosis not present

## 2023-02-16 MED ORDER — NITROFURANTOIN MONOHYD MACRO 100 MG PO CAPS
100.0000 mg | ORAL_CAPSULE | Freq: Two times a day (BID) | ORAL | 0 refills | Status: AC
Start: 2023-02-16 — End: 2023-02-23

## 2023-02-16 MED ORDER — NITROFURANTOIN MONOHYD MACRO 100 MG PO CAPS
100.0000 mg | ORAL_CAPSULE | Freq: Two times a day (BID) | ORAL | 0 refills | Status: DC
Start: 2023-02-16 — End: 2023-02-16

## 2023-02-16 NOTE — Progress Notes (Signed)
Please let pt know that the pathogen which grew on her urine culture is not sensitive to Bactrim, which was prescribed. She can discontinue that and start Macrobid instead (ordered today). She should hold her daily low dose Macrodantin while taking that acute course of Nitrofurantoin.

## 2023-02-16 NOTE — Telephone Encounter (Signed)
-----   Message from Robin Blackburn sent at 02/16/2023  8:19 AM EDT ----- Please let pt know that the pathogen which grew on her urine culture is not sensitive to Bactrim, which was prescribed. She can discontinue that and start Macrobid instead (ordered today). She should hold her daily low dose Macrodantin while taking that acute course of Nitrofurantoin.

## 2023-02-16 NOTE — Telephone Encounter (Signed)
Patient is made aware, patient states she is still having discomfort when she void. Patient states she would like to know what infection was showed on the urine culture. Patient is made aware a message will be sent to Maralyn Sago and someone will reach back out. Patient voiced understanding.

## 2023-02-16 NOTE — Telephone Encounter (Signed)
Patient needing antibiotic to be called into  [facility] - [place], [place] - [address] Phone: [phone]  Fax: [phone]     She also wants to know what kind of infection she has. It's very important for her to know.  Please advise.

## 2023-02-17 DIAGNOSIS — G894 Chronic pain syndrome: Secondary | ICD-10-CM | POA: Diagnosis not present

## 2023-02-17 DIAGNOSIS — R278 Other lack of coordination: Secondary | ICD-10-CM | POA: Diagnosis not present

## 2023-02-17 DIAGNOSIS — M797 Fibromyalgia: Secondary | ICD-10-CM | POA: Diagnosis not present

## 2023-02-17 DIAGNOSIS — M6281 Muscle weakness (generalized): Secondary | ICD-10-CM | POA: Diagnosis not present

## 2023-02-17 DIAGNOSIS — M069 Rheumatoid arthritis, unspecified: Secondary | ICD-10-CM | POA: Diagnosis not present

## 2023-02-18 DIAGNOSIS — M797 Fibromyalgia: Secondary | ICD-10-CM | POA: Diagnosis not present

## 2023-02-18 DIAGNOSIS — F411 Generalized anxiety disorder: Secondary | ICD-10-CM | POA: Diagnosis not present

## 2023-02-18 DIAGNOSIS — R278 Other lack of coordination: Secondary | ICD-10-CM | POA: Diagnosis not present

## 2023-02-18 DIAGNOSIS — M069 Rheumatoid arthritis, unspecified: Secondary | ICD-10-CM | POA: Diagnosis not present

## 2023-02-18 DIAGNOSIS — G894 Chronic pain syndrome: Secondary | ICD-10-CM | POA: Diagnosis not present

## 2023-02-18 DIAGNOSIS — M6281 Muscle weakness (generalized): Secondary | ICD-10-CM | POA: Diagnosis not present

## 2023-02-18 NOTE — Telephone Encounter (Signed)
Patient called in today and states that she is having a lot of discomfort when voided. Patient states that she would like to take pyridium BID until she complete the Macrobid and then start back on taking the pyridium PRN. Patient is aware a message will be sent to the provider on advisement. Patient voiced understanding

## 2023-02-19 DIAGNOSIS — G894 Chronic pain syndrome: Secondary | ICD-10-CM | POA: Diagnosis not present

## 2023-02-19 DIAGNOSIS — M6281 Muscle weakness (generalized): Secondary | ICD-10-CM | POA: Diagnosis not present

## 2023-02-19 DIAGNOSIS — M797 Fibromyalgia: Secondary | ICD-10-CM | POA: Diagnosis not present

## 2023-02-19 DIAGNOSIS — M069 Rheumatoid arthritis, unspecified: Secondary | ICD-10-CM | POA: Diagnosis not present

## 2023-02-19 DIAGNOSIS — R278 Other lack of coordination: Secondary | ICD-10-CM | POA: Diagnosis not present

## 2023-02-20 DIAGNOSIS — G894 Chronic pain syndrome: Secondary | ICD-10-CM | POA: Diagnosis not present

## 2023-02-20 DIAGNOSIS — M797 Fibromyalgia: Secondary | ICD-10-CM | POA: Diagnosis not present

## 2023-02-20 DIAGNOSIS — M069 Rheumatoid arthritis, unspecified: Secondary | ICD-10-CM | POA: Diagnosis not present

## 2023-02-20 DIAGNOSIS — M6281 Muscle weakness (generalized): Secondary | ICD-10-CM | POA: Diagnosis not present

## 2023-02-20 DIAGNOSIS — R278 Other lack of coordination: Secondary | ICD-10-CM | POA: Diagnosis not present

## 2023-02-20 NOTE — Telephone Encounter (Signed)
Tried calling patient faculty with no answer    Pyridium (phenazopyridine) as needed (commonly known under the  "AZO" brand). - For bladder pain / burning with urination. - Available over the counter. - Advised to take no more than 3 days consecutively at a time due to risk for methemoglobinemia, liver function issues, and bone health damage with long term use of Pyridium.

## 2023-02-24 DIAGNOSIS — R278 Other lack of coordination: Secondary | ICD-10-CM | POA: Diagnosis not present

## 2023-02-24 DIAGNOSIS — M797 Fibromyalgia: Secondary | ICD-10-CM | POA: Diagnosis not present

## 2023-02-24 DIAGNOSIS — M069 Rheumatoid arthritis, unspecified: Secondary | ICD-10-CM | POA: Diagnosis not present

## 2023-02-24 DIAGNOSIS — M6281 Muscle weakness (generalized): Secondary | ICD-10-CM | POA: Diagnosis not present

## 2023-02-24 DIAGNOSIS — G894 Chronic pain syndrome: Secondary | ICD-10-CM | POA: Diagnosis not present

## 2023-02-25 DIAGNOSIS — G4700 Insomnia, unspecified: Secondary | ICD-10-CM | POA: Diagnosis not present

## 2023-02-25 DIAGNOSIS — M6281 Muscle weakness (generalized): Secondary | ICD-10-CM | POA: Diagnosis not present

## 2023-02-25 DIAGNOSIS — R278 Other lack of coordination: Secondary | ICD-10-CM | POA: Diagnosis not present

## 2023-02-25 DIAGNOSIS — F32A Depression, unspecified: Secondary | ICD-10-CM | POA: Diagnosis not present

## 2023-02-25 DIAGNOSIS — G894 Chronic pain syndrome: Secondary | ICD-10-CM | POA: Diagnosis not present

## 2023-02-25 DIAGNOSIS — M797 Fibromyalgia: Secondary | ICD-10-CM | POA: Diagnosis not present

## 2023-02-25 DIAGNOSIS — M069 Rheumatoid arthritis, unspecified: Secondary | ICD-10-CM | POA: Diagnosis not present

## 2023-02-25 NOTE — Telephone Encounter (Signed)
Patient is made aware and states she completed her abt Macrobid  and her infection is no better, but she think that her infection has improved some. Patient states that she want another round of ABT and urinalysis done to see if she still have an infection. Patient is aware a message will be sent to Sarah on recommendations. Patient voiced understanding       Pyridium (phenazopyridine) as needed (commonly known under the  "AZO" brand). - For bladder pain / burning with urination. - Available over the counter. - Advised to take no more than 3 days consecutively at a time due to risk for methemoglobinemia, liver function issues, and bone health damage with long term use of Pyridium.     Patient states she completed her abt Macrobid  and her infection is no better, but she think that her infection has improved some. Patient states that she want another round of ABT and urinalysis. Patient is aware a message will be sent to Sarah on recommendations. Patient voiced understanding

## 2023-02-26 DIAGNOSIS — M797 Fibromyalgia: Secondary | ICD-10-CM | POA: Diagnosis not present

## 2023-02-26 DIAGNOSIS — G894 Chronic pain syndrome: Secondary | ICD-10-CM | POA: Diagnosis not present

## 2023-02-26 DIAGNOSIS — R278 Other lack of coordination: Secondary | ICD-10-CM | POA: Diagnosis not present

## 2023-02-26 DIAGNOSIS — M069 Rheumatoid arthritis, unspecified: Secondary | ICD-10-CM | POA: Diagnosis not present

## 2023-02-26 DIAGNOSIS — M6281 Muscle weakness (generalized): Secondary | ICD-10-CM | POA: Diagnosis not present

## 2023-02-26 NOTE — Telephone Encounter (Signed)
Patinet called again she is having a lot of pain and wants the antibiotics called in asap,  she goes to therapy at 2pm , she would like a call before then.  Also call her number not the facility

## 2023-02-26 NOTE — Telephone Encounter (Signed)
Tried Research scientist (medical) with no answer, left voiced message on nurse Revonda Standard phone and also left message with Colin Mulders front desk to have Revonda Standard return call to office. Brianna voiced understanding.   1. Needs repeat urine culture to re-assess as she was treated appropriately based on urine culture results from 02/10/2023. Please notify her SNF: Verbal order for in & out straight cath to obtain sterile urine specimen to be sent for urine culture. Fax urine culture result to our office [phone]).  2. Please ask SNF staff if she has consistently been receiving application of topical vaginal estrogen cream every other night for her severe vaginal atrophy as that is another possible etiology / contributing factor for her symptoms.  3. Plan at [facility] on 02/10/23 was f/u for cystoscopy for further evaluation due to complex recurrent UTI history with evidence of bladder diverticuli on prior CT. Currently scheduled with Dr. Ronne Binning on 04/29/2023. Please see if Dr. Ronne Binning or any of the other urologists can get her in sooner for that.

## 2023-02-27 DIAGNOSIS — M797 Fibromyalgia: Secondary | ICD-10-CM | POA: Diagnosis not present

## 2023-02-27 DIAGNOSIS — M069 Rheumatoid arthritis, unspecified: Secondary | ICD-10-CM | POA: Diagnosis not present

## 2023-02-27 DIAGNOSIS — R278 Other lack of coordination: Secondary | ICD-10-CM | POA: Diagnosis not present

## 2023-02-27 DIAGNOSIS — M6281 Muscle weakness (generalized): Secondary | ICD-10-CM | POA: Diagnosis not present

## 2023-02-27 DIAGNOSIS — G894 Chronic pain syndrome: Secondary | ICD-10-CM | POA: Diagnosis not present

## 2023-02-27 NOTE — Telephone Encounter (Signed)
Amy, RN was made aware and given verbal. Amy voiced understanding.

## 2023-02-28 DIAGNOSIS — A0472 Enterocolitis due to Clostridium difficile, not specified as recurrent: Secondary | ICD-10-CM | POA: Diagnosis not present

## 2023-03-02 DIAGNOSIS — R278 Other lack of coordination: Secondary | ICD-10-CM | POA: Diagnosis not present

## 2023-03-02 DIAGNOSIS — R195 Other fecal abnormalities: Secondary | ICD-10-CM | POA: Diagnosis not present

## 2023-03-02 DIAGNOSIS — M797 Fibromyalgia: Secondary | ICD-10-CM | POA: Diagnosis not present

## 2023-03-02 DIAGNOSIS — M069 Rheumatoid arthritis, unspecified: Secondary | ICD-10-CM | POA: Diagnosis not present

## 2023-03-02 DIAGNOSIS — G894 Chronic pain syndrome: Secondary | ICD-10-CM | POA: Diagnosis not present

## 2023-03-02 DIAGNOSIS — M6281 Muscle weakness (generalized): Secondary | ICD-10-CM | POA: Diagnosis not present

## 2023-03-02 DIAGNOSIS — N3020 Other chronic cystitis without hematuria: Secondary | ICD-10-CM | POA: Diagnosis not present

## 2023-03-03 DIAGNOSIS — M6281 Muscle weakness (generalized): Secondary | ICD-10-CM | POA: Diagnosis not present

## 2023-03-03 DIAGNOSIS — M069 Rheumatoid arthritis, unspecified: Secondary | ICD-10-CM | POA: Diagnosis not present

## 2023-03-03 DIAGNOSIS — G894 Chronic pain syndrome: Secondary | ICD-10-CM | POA: Diagnosis not present

## 2023-03-03 DIAGNOSIS — M797 Fibromyalgia: Secondary | ICD-10-CM | POA: Diagnosis not present

## 2023-03-03 DIAGNOSIS — R278 Other lack of coordination: Secondary | ICD-10-CM | POA: Diagnosis not present

## 2023-03-04 DIAGNOSIS — M069 Rheumatoid arthritis, unspecified: Secondary | ICD-10-CM | POA: Diagnosis not present

## 2023-03-04 DIAGNOSIS — M6281 Muscle weakness (generalized): Secondary | ICD-10-CM | POA: Diagnosis not present

## 2023-03-04 DIAGNOSIS — G894 Chronic pain syndrome: Secondary | ICD-10-CM | POA: Diagnosis not present

## 2023-03-04 DIAGNOSIS — R278 Other lack of coordination: Secondary | ICD-10-CM | POA: Diagnosis not present

## 2023-03-04 DIAGNOSIS — M797 Fibromyalgia: Secondary | ICD-10-CM | POA: Diagnosis not present

## 2023-03-05 ENCOUNTER — Telehealth: Payer: Self-pay

## 2023-03-05 DIAGNOSIS — G894 Chronic pain syndrome: Secondary | ICD-10-CM | POA: Diagnosis not present

## 2023-03-05 DIAGNOSIS — M797 Fibromyalgia: Secondary | ICD-10-CM | POA: Diagnosis not present

## 2023-03-05 DIAGNOSIS — M069 Rheumatoid arthritis, unspecified: Secondary | ICD-10-CM | POA: Diagnosis not present

## 2023-03-05 DIAGNOSIS — M6281 Muscle weakness (generalized): Secondary | ICD-10-CM | POA: Diagnosis not present

## 2023-03-05 DIAGNOSIS — R278 Other lack of coordination: Secondary | ICD-10-CM | POA: Diagnosis not present

## 2023-03-05 NOTE — Telephone Encounter (Signed)
Patient called upset about our office calling the health department. Patient is made aware we did not contact the health department. Patient states that the faculty moved her out of her room to do deep cleaning. Patient was made aware that her urine culture showed Enterococcus faecalis. Patient voiced understanding

## 2023-03-06 DIAGNOSIS — R454 Irritability and anger: Secondary | ICD-10-CM | POA: Diagnosis not present

## 2023-03-06 DIAGNOSIS — F411 Generalized anxiety disorder: Secondary | ICD-10-CM | POA: Diagnosis not present

## 2023-03-06 DIAGNOSIS — Z1613 Resistance to carbapenem: Secondary | ICD-10-CM | POA: Diagnosis not present

## 2023-03-06 DIAGNOSIS — Z228 Carrier of other infectious diseases: Secondary | ICD-10-CM | POA: Diagnosis not present

## 2023-03-06 DIAGNOSIS — R278 Other lack of coordination: Secondary | ICD-10-CM | POA: Diagnosis not present

## 2023-03-06 DIAGNOSIS — G894 Chronic pain syndrome: Secondary | ICD-10-CM | POA: Diagnosis not present

## 2023-03-06 DIAGNOSIS — M6281 Muscle weakness (generalized): Secondary | ICD-10-CM | POA: Diagnosis not present

## 2023-03-06 DIAGNOSIS — M797 Fibromyalgia: Secondary | ICD-10-CM | POA: Diagnosis not present

## 2023-03-06 DIAGNOSIS — M069 Rheumatoid arthritis, unspecified: Secondary | ICD-10-CM | POA: Diagnosis not present

## 2023-03-10 DIAGNOSIS — M069 Rheumatoid arthritis, unspecified: Secondary | ICD-10-CM | POA: Diagnosis not present

## 2023-03-10 DIAGNOSIS — R278 Other lack of coordination: Secondary | ICD-10-CM | POA: Diagnosis not present

## 2023-03-10 DIAGNOSIS — M6281 Muscle weakness (generalized): Secondary | ICD-10-CM | POA: Diagnosis not present

## 2023-03-10 DIAGNOSIS — G894 Chronic pain syndrome: Secondary | ICD-10-CM | POA: Diagnosis not present

## 2023-03-10 DIAGNOSIS — M797 Fibromyalgia: Secondary | ICD-10-CM | POA: Diagnosis not present

## 2023-03-11 DIAGNOSIS — M797 Fibromyalgia: Secondary | ICD-10-CM | POA: Diagnosis not present

## 2023-03-11 DIAGNOSIS — M069 Rheumatoid arthritis, unspecified: Secondary | ICD-10-CM | POA: Diagnosis not present

## 2023-03-11 DIAGNOSIS — G894 Chronic pain syndrome: Secondary | ICD-10-CM | POA: Diagnosis not present

## 2023-03-11 DIAGNOSIS — M6281 Muscle weakness (generalized): Secondary | ICD-10-CM | POA: Diagnosis not present

## 2023-03-11 DIAGNOSIS — R278 Other lack of coordination: Secondary | ICD-10-CM | POA: Diagnosis not present

## 2023-03-14 DIAGNOSIS — M6281 Muscle weakness (generalized): Secondary | ICD-10-CM | POA: Diagnosis not present

## 2023-03-14 DIAGNOSIS — M797 Fibromyalgia: Secondary | ICD-10-CM | POA: Diagnosis not present

## 2023-03-14 DIAGNOSIS — R278 Other lack of coordination: Secondary | ICD-10-CM | POA: Diagnosis not present

## 2023-03-14 DIAGNOSIS — G894 Chronic pain syndrome: Secondary | ICD-10-CM | POA: Diagnosis not present

## 2023-03-14 DIAGNOSIS — M069 Rheumatoid arthritis, unspecified: Secondary | ICD-10-CM | POA: Diagnosis not present

## 2023-03-16 DIAGNOSIS — I10 Essential (primary) hypertension: Secondary | ICD-10-CM | POA: Diagnosis not present

## 2023-03-16 DIAGNOSIS — M069 Rheumatoid arthritis, unspecified: Secondary | ICD-10-CM | POA: Diagnosis not present

## 2023-03-16 DIAGNOSIS — R278 Other lack of coordination: Secondary | ICD-10-CM | POA: Diagnosis not present

## 2023-03-16 DIAGNOSIS — I679 Cerebrovascular disease, unspecified: Secondary | ICD-10-CM | POA: Diagnosis not present

## 2023-03-16 DIAGNOSIS — G894 Chronic pain syndrome: Secondary | ICD-10-CM | POA: Diagnosis not present

## 2023-03-16 DIAGNOSIS — M797 Fibromyalgia: Secondary | ICD-10-CM | POA: Diagnosis not present

## 2023-03-16 DIAGNOSIS — N3020 Other chronic cystitis without hematuria: Secondary | ICD-10-CM | POA: Diagnosis not present

## 2023-03-16 DIAGNOSIS — M79605 Pain in left leg: Secondary | ICD-10-CM | POA: Diagnosis not present

## 2023-03-16 DIAGNOSIS — M6281 Muscle weakness (generalized): Secondary | ICD-10-CM | POA: Diagnosis not present

## 2023-03-17 DIAGNOSIS — R278 Other lack of coordination: Secondary | ICD-10-CM | POA: Diagnosis not present

## 2023-03-17 DIAGNOSIS — M6281 Muscle weakness (generalized): Secondary | ICD-10-CM | POA: Diagnosis not present

## 2023-03-17 DIAGNOSIS — G894 Chronic pain syndrome: Secondary | ICD-10-CM | POA: Diagnosis not present

## 2023-03-17 DIAGNOSIS — M797 Fibromyalgia: Secondary | ICD-10-CM | POA: Diagnosis not present

## 2023-03-17 DIAGNOSIS — M069 Rheumatoid arthritis, unspecified: Secondary | ICD-10-CM | POA: Diagnosis not present

## 2023-03-18 DIAGNOSIS — F411 Generalized anxiety disorder: Secondary | ICD-10-CM | POA: Diagnosis not present

## 2023-03-18 DIAGNOSIS — M6281 Muscle weakness (generalized): Secondary | ICD-10-CM | POA: Diagnosis not present

## 2023-03-18 DIAGNOSIS — M069 Rheumatoid arthritis, unspecified: Secondary | ICD-10-CM | POA: Diagnosis not present

## 2023-03-18 DIAGNOSIS — R278 Other lack of coordination: Secondary | ICD-10-CM | POA: Diagnosis not present

## 2023-03-18 DIAGNOSIS — G894 Chronic pain syndrome: Secondary | ICD-10-CM | POA: Diagnosis not present

## 2023-03-18 DIAGNOSIS — M25572 Pain in left ankle and joints of left foot: Secondary | ICD-10-CM | POA: Diagnosis not present

## 2023-03-18 DIAGNOSIS — M797 Fibromyalgia: Secondary | ICD-10-CM | POA: Diagnosis not present

## 2023-03-19 DIAGNOSIS — R278 Other lack of coordination: Secondary | ICD-10-CM | POA: Diagnosis not present

## 2023-03-19 DIAGNOSIS — M069 Rheumatoid arthritis, unspecified: Secondary | ICD-10-CM | POA: Diagnosis not present

## 2023-03-19 DIAGNOSIS — B351 Tinea unguium: Secondary | ICD-10-CM | POA: Diagnosis not present

## 2023-03-19 DIAGNOSIS — M797 Fibromyalgia: Secondary | ICD-10-CM | POA: Diagnosis not present

## 2023-03-19 DIAGNOSIS — M79675 Pain in left toe(s): Secondary | ICD-10-CM | POA: Diagnosis not present

## 2023-03-19 DIAGNOSIS — M79674 Pain in right toe(s): Secondary | ICD-10-CM | POA: Diagnosis not present

## 2023-03-19 DIAGNOSIS — G894 Chronic pain syndrome: Secondary | ICD-10-CM | POA: Diagnosis not present

## 2023-03-19 DIAGNOSIS — M6281 Muscle weakness (generalized): Secondary | ICD-10-CM | POA: Diagnosis not present

## 2023-03-20 DIAGNOSIS — G894 Chronic pain syndrome: Secondary | ICD-10-CM | POA: Diagnosis not present

## 2023-03-20 DIAGNOSIS — F411 Generalized anxiety disorder: Secondary | ICD-10-CM | POA: Diagnosis not present

## 2023-03-20 DIAGNOSIS — M069 Rheumatoid arthritis, unspecified: Secondary | ICD-10-CM | POA: Diagnosis not present

## 2023-03-20 DIAGNOSIS — M797 Fibromyalgia: Secondary | ICD-10-CM | POA: Diagnosis not present

## 2023-03-20 DIAGNOSIS — M6281 Muscle weakness (generalized): Secondary | ICD-10-CM | POA: Diagnosis not present

## 2023-03-20 DIAGNOSIS — R278 Other lack of coordination: Secondary | ICD-10-CM | POA: Diagnosis not present

## 2023-03-23 DIAGNOSIS — G894 Chronic pain syndrome: Secondary | ICD-10-CM | POA: Diagnosis not present

## 2023-03-23 DIAGNOSIS — M6281 Muscle weakness (generalized): Secondary | ICD-10-CM | POA: Diagnosis not present

## 2023-03-23 DIAGNOSIS — M797 Fibromyalgia: Secondary | ICD-10-CM | POA: Diagnosis not present

## 2023-03-23 DIAGNOSIS — M069 Rheumatoid arthritis, unspecified: Secondary | ICD-10-CM | POA: Diagnosis not present

## 2023-03-23 DIAGNOSIS — R278 Other lack of coordination: Secondary | ICD-10-CM | POA: Diagnosis not present

## 2023-03-24 DIAGNOSIS — R278 Other lack of coordination: Secondary | ICD-10-CM | POA: Diagnosis not present

## 2023-03-24 DIAGNOSIS — M797 Fibromyalgia: Secondary | ICD-10-CM | POA: Diagnosis not present

## 2023-03-24 DIAGNOSIS — G894 Chronic pain syndrome: Secondary | ICD-10-CM | POA: Diagnosis not present

## 2023-03-24 DIAGNOSIS — M6281 Muscle weakness (generalized): Secondary | ICD-10-CM | POA: Diagnosis not present

## 2023-03-24 DIAGNOSIS — M069 Rheumatoid arthritis, unspecified: Secondary | ICD-10-CM | POA: Diagnosis not present

## 2023-03-25 DIAGNOSIS — M069 Rheumatoid arthritis, unspecified: Secondary | ICD-10-CM | POA: Diagnosis not present

## 2023-03-25 DIAGNOSIS — M797 Fibromyalgia: Secondary | ICD-10-CM | POA: Diagnosis not present

## 2023-03-25 DIAGNOSIS — R278 Other lack of coordination: Secondary | ICD-10-CM | POA: Diagnosis not present

## 2023-03-25 DIAGNOSIS — R21 Rash and other nonspecific skin eruption: Secondary | ICD-10-CM | POA: Diagnosis not present

## 2023-03-25 DIAGNOSIS — G894 Chronic pain syndrome: Secondary | ICD-10-CM | POA: Diagnosis not present

## 2023-03-25 DIAGNOSIS — M6281 Muscle weakness (generalized): Secondary | ICD-10-CM | POA: Diagnosis not present

## 2023-03-26 DIAGNOSIS — R278 Other lack of coordination: Secondary | ICD-10-CM | POA: Diagnosis not present

## 2023-03-26 DIAGNOSIS — G894 Chronic pain syndrome: Secondary | ICD-10-CM | POA: Diagnosis not present

## 2023-03-26 DIAGNOSIS — M797 Fibromyalgia: Secondary | ICD-10-CM | POA: Diagnosis not present

## 2023-03-26 DIAGNOSIS — M6281 Muscle weakness (generalized): Secondary | ICD-10-CM | POA: Diagnosis not present

## 2023-03-26 DIAGNOSIS — M069 Rheumatoid arthritis, unspecified: Secondary | ICD-10-CM | POA: Diagnosis not present

## 2023-03-27 DIAGNOSIS — M6281 Muscle weakness (generalized): Secondary | ICD-10-CM | POA: Diagnosis not present

## 2023-03-27 DIAGNOSIS — R278 Other lack of coordination: Secondary | ICD-10-CM | POA: Diagnosis not present

## 2023-03-27 DIAGNOSIS — M069 Rheumatoid arthritis, unspecified: Secondary | ICD-10-CM | POA: Diagnosis not present

## 2023-03-27 DIAGNOSIS — M797 Fibromyalgia: Secondary | ICD-10-CM | POA: Diagnosis not present

## 2023-03-27 DIAGNOSIS — G894 Chronic pain syndrome: Secondary | ICD-10-CM | POA: Diagnosis not present

## 2023-03-30 DIAGNOSIS — G894 Chronic pain syndrome: Secondary | ICD-10-CM | POA: Diagnosis not present

## 2023-03-30 DIAGNOSIS — R278 Other lack of coordination: Secondary | ICD-10-CM | POA: Diagnosis not present

## 2023-03-30 DIAGNOSIS — M069 Rheumatoid arthritis, unspecified: Secondary | ICD-10-CM | POA: Diagnosis not present

## 2023-03-30 DIAGNOSIS — M6281 Muscle weakness (generalized): Secondary | ICD-10-CM | POA: Diagnosis not present

## 2023-03-30 DIAGNOSIS — M797 Fibromyalgia: Secondary | ICD-10-CM | POA: Diagnosis not present

## 2023-03-30 DIAGNOSIS — R21 Rash and other nonspecific skin eruption: Secondary | ICD-10-CM | POA: Diagnosis not present

## 2023-03-31 DIAGNOSIS — R278 Other lack of coordination: Secondary | ICD-10-CM | POA: Diagnosis not present

## 2023-03-31 DIAGNOSIS — G894 Chronic pain syndrome: Secondary | ICD-10-CM | POA: Diagnosis not present

## 2023-03-31 DIAGNOSIS — M6281 Muscle weakness (generalized): Secondary | ICD-10-CM | POA: Diagnosis not present

## 2023-03-31 DIAGNOSIS — M797 Fibromyalgia: Secondary | ICD-10-CM | POA: Diagnosis not present

## 2023-03-31 DIAGNOSIS — M069 Rheumatoid arthritis, unspecified: Secondary | ICD-10-CM | POA: Diagnosis not present

## 2023-04-01 DIAGNOSIS — M6281 Muscle weakness (generalized): Secondary | ICD-10-CM | POA: Diagnosis not present

## 2023-04-01 DIAGNOSIS — G894 Chronic pain syndrome: Secondary | ICD-10-CM | POA: Diagnosis not present

## 2023-04-01 DIAGNOSIS — M797 Fibromyalgia: Secondary | ICD-10-CM | POA: Diagnosis not present

## 2023-04-01 DIAGNOSIS — R278 Other lack of coordination: Secondary | ICD-10-CM | POA: Diagnosis not present

## 2023-04-01 DIAGNOSIS — M069 Rheumatoid arthritis, unspecified: Secondary | ICD-10-CM | POA: Diagnosis not present

## 2023-04-02 ENCOUNTER — Encounter: Payer: Medicare PPO | Admitting: Registered Nurse

## 2023-04-02 DIAGNOSIS — G894 Chronic pain syndrome: Secondary | ICD-10-CM | POA: Diagnosis not present

## 2023-04-02 DIAGNOSIS — M797 Fibromyalgia: Secondary | ICD-10-CM | POA: Diagnosis not present

## 2023-04-02 DIAGNOSIS — R278 Other lack of coordination: Secondary | ICD-10-CM | POA: Diagnosis not present

## 2023-04-02 DIAGNOSIS — M6281 Muscle weakness (generalized): Secondary | ICD-10-CM | POA: Diagnosis not present

## 2023-04-02 DIAGNOSIS — M069 Rheumatoid arthritis, unspecified: Secondary | ICD-10-CM | POA: Diagnosis not present

## 2023-04-05 DIAGNOSIS — R278 Other lack of coordination: Secondary | ICD-10-CM | POA: Diagnosis not present

## 2023-04-05 DIAGNOSIS — G894 Chronic pain syndrome: Secondary | ICD-10-CM | POA: Diagnosis not present

## 2023-04-05 DIAGNOSIS — M069 Rheumatoid arthritis, unspecified: Secondary | ICD-10-CM | POA: Diagnosis not present

## 2023-04-05 DIAGNOSIS — M6281 Muscle weakness (generalized): Secondary | ICD-10-CM | POA: Diagnosis not present

## 2023-04-05 DIAGNOSIS — M797 Fibromyalgia: Secondary | ICD-10-CM | POA: Diagnosis not present

## 2023-04-07 DIAGNOSIS — M069 Rheumatoid arthritis, unspecified: Secondary | ICD-10-CM | POA: Diagnosis not present

## 2023-04-07 DIAGNOSIS — R278 Other lack of coordination: Secondary | ICD-10-CM | POA: Diagnosis not present

## 2023-04-07 DIAGNOSIS — M6281 Muscle weakness (generalized): Secondary | ICD-10-CM | POA: Diagnosis not present

## 2023-04-07 DIAGNOSIS — M797 Fibromyalgia: Secondary | ICD-10-CM | POA: Diagnosis not present

## 2023-04-07 DIAGNOSIS — G894 Chronic pain syndrome: Secondary | ICD-10-CM | POA: Diagnosis not present

## 2023-04-10 DIAGNOSIS — M069 Rheumatoid arthritis, unspecified: Secondary | ICD-10-CM | POA: Diagnosis not present

## 2023-04-10 DIAGNOSIS — G894 Chronic pain syndrome: Secondary | ICD-10-CM | POA: Diagnosis not present

## 2023-04-10 DIAGNOSIS — F411 Generalized anxiety disorder: Secondary | ICD-10-CM | POA: Diagnosis not present

## 2023-04-10 DIAGNOSIS — R278 Other lack of coordination: Secondary | ICD-10-CM | POA: Diagnosis not present

## 2023-04-10 DIAGNOSIS — M797 Fibromyalgia: Secondary | ICD-10-CM | POA: Diagnosis not present

## 2023-04-10 DIAGNOSIS — M6281 Muscle weakness (generalized): Secondary | ICD-10-CM | POA: Diagnosis not present

## 2023-04-13 DIAGNOSIS — M797 Fibromyalgia: Secondary | ICD-10-CM | POA: Diagnosis not present

## 2023-04-13 DIAGNOSIS — M79671 Pain in right foot: Secondary | ICD-10-CM | POA: Diagnosis not present

## 2023-04-13 DIAGNOSIS — M6281 Muscle weakness (generalized): Secondary | ICD-10-CM | POA: Diagnosis not present

## 2023-04-13 DIAGNOSIS — G894 Chronic pain syndrome: Secondary | ICD-10-CM | POA: Diagnosis not present

## 2023-04-13 DIAGNOSIS — R278 Other lack of coordination: Secondary | ICD-10-CM | POA: Diagnosis not present

## 2023-04-13 DIAGNOSIS — M069 Rheumatoid arthritis, unspecified: Secondary | ICD-10-CM | POA: Diagnosis not present

## 2023-04-14 DIAGNOSIS — M6281 Muscle weakness (generalized): Secondary | ICD-10-CM | POA: Diagnosis not present

## 2023-04-14 DIAGNOSIS — G894 Chronic pain syndrome: Secondary | ICD-10-CM | POA: Diagnosis not present

## 2023-04-14 DIAGNOSIS — M069 Rheumatoid arthritis, unspecified: Secondary | ICD-10-CM | POA: Diagnosis not present

## 2023-04-14 DIAGNOSIS — M797 Fibromyalgia: Secondary | ICD-10-CM | POA: Diagnosis not present

## 2023-04-14 DIAGNOSIS — R278 Other lack of coordination: Secondary | ICD-10-CM | POA: Diagnosis not present

## 2023-04-15 NOTE — Progress Notes (Unsigned)
Subjective:    Patient ID: Robin Blackburn, female    DOB: 1946-02-28, 77 y.o.   MRN: [id number]  HPI:  Robin Blackburn is a 77 y.o. female who returns for follow up appointment for chronic pain and medication refill. She states her pain is located in her lower back and generalized joint pain. She rates her pain 8. She's not following a exercise regimen.  Robin Blackburn was admitted to [facility] on 11/06/2022 and discharged on 11/09/2022, for stroke like symptoms. Discharge note was reviewed.   Robin Blackburn Morphine equivalent is 30.00 MME.   Oral Swab was Performed today.  Pain Inventory Average Pain 8 Pain Right Now 8 My pain is constant, sharp, burning, dull, stabbing, tingling, and aching  In the last 24 hours, has pain interfered with the following? General activity 7 Relation with others 0 Enjoyment of life 10 What TIME of day is your pain at its worst? morning , daytime, evening, and night Sleep (in general) Fair  Pain is worse with: walking, bending, sitting, standing, and some activites Pain improves with: rest, therapy/exercise, and medication Relief from Meds: 6  Family History  Problem Relation Age of Onset   Hypertension Father    Transient ischemic attack Father    Hypertension Brother    Leukemia Brother    CVA Maternal Grandfather    Social History   Socioeconomic History   Marital status: Divorced    Spouse name: Not on file   Number of children: 0   Years of education: 12   Highest education level: 12th grade  Occupational History   Not on file  Tobacco Use   Smoking status: Never    Passive exposure: Never   Smokeless tobacco: Never  Vaping Use   Vaping status: Never Used  Substance and Sexual Activity   Alcohol use: No   Drug use: No   Sexual activity: Not Currently  Other Topics Concern   Not on file  Social History Narrative   Not on file   Social Determinants of Health   Financial Resource Strain: Low Risk  (01/06/2022)   Overall Financial Resource  Strain (CARDIA)    Difficulty of Paying Living Expenses: Not hard at all  Food Insecurity: No Food Insecurity (01/06/2022)   Hunger Vital Sign    Worried About Running Out of Food in the Last Year: Never true    Ran Out of Food in the Last Year: Never true  Transportation Needs: No Transportation Needs (01/06/2022)   PRAPARE - Administrator, Civil Service (Medical): No    Lack of Transportation (Non-Medical): No  Physical Activity: Inactive (11/07/2022)   Received from [facility], [facility]   Exercise Vital Sign    Days of Exercise per Week: 0 days    Minutes of Exercise per Session: 0 min  Stress: No Stress Concern Present (01/06/2022)   [employer] of Occupational Health - Occupational Stress Questionnaire    Feeling of Stress : Not at all  Social Connections: Unknown (11/06/2022)   Received from [facility], [facility]   Social Network    Social Network: Not on file   Past Surgical History:  Procedure Laterality Date   ABDOMINAL HYSTERECTOMY     ANKLE RECONSTRUCTION     APPENDECTOMY     BACK SURGERY     CHOLECYSTECTOMY     KNEE SURGERY     Past Surgical History:  Procedure Laterality Date   ABDOMINAL HYSTERECTOMY  ANKLE RECONSTRUCTION     APPENDECTOMY     BACK SURGERY     CHOLECYSTECTOMY     KNEE SURGERY     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  Opioid Risk Score:   Fall Risk Score:  `1  Depression screen PHQ 2/9     10/02/2022    1:02 PM 07/03/2022    1:01 PM 03/25/2022    1:54 PM 01/06/2022    9:33 AM 11/22/2021    1:26 PM  Depression screen PHQ 2/9  Decreased Interest 0 0 1 0 0  Down, Depressed, Hopeless 0 0 1 1 1   PHQ - 2 Score 0 0 2 1 1   Altered sleeping     0  Tired,  decreased energy     0  Change in appetite     0  Feeling bad or failure about yourself      0  Trouble concentrating     0  Moving slowly or fidgety/restless     0  PHQ-9 Score     1    Review of Systems  Musculoskeletal:  Positive for arthralgias, back pain and gait problem.       Pain all over, pain in left leg, right foot pain  All other systems reviewed and are negative.      Objective:   Physical Exam Vitals and nursing note reviewed.  Constitutional:      Appearance: Normal appearance.  Neck:     Comments: Cervical Paraspinal Tenderness: C-5-C-6 Cardiovascular:     Rate and Rhythm: Normal rate and regular rhythm.     Pulses: Normal pulses.     Heart sounds: Normal heart sounds.  Pulmonary:     Effort: Pulmonary effort is normal.     Breath sounds: Normal breath sounds.  Musculoskeletal:     Comments: Normal Muscle Bulk and Muscle Testing Reveals:  Upper Extremities: Full ROM and Muscle Strength  on Right 4/5 and Left 5/5 Bilateral AC Joint Tenderness Thoracic and Lumbar Hypersensitivity  Bilateral Greater Trochanter Tenderness Lower Extremities: Right: Decreased ROM and Muscle Strength 3/5  Right Lower Extremity Flexion Produces Pain into her Right Lower Extremity and Right Foot  Left Lower Extremity: Full ROM and Muscle Strength 5/5 Arrived in wheelchair     Skin:    General: Skin is warm and dry.  Neurological:     Mental Status: She is alert and oriented to person, place, and time.  Psychiatric:        Mood and Affect: Mood normal.        Behavior: Behavior normal.         Assessment & Plan:  Lumbar Spinal Stenosis: Continue HEP as Tolerated. Continue to Monitor. 04/16/2023 Fibromyalgia: Continue  Gabapentin 300 mg one capsule every 8 hours.,Continue Physical Therapy at [facility] with Physical Therapist. 04/16/2023 Chronic Pain Syndrome: Continue : Lidocaine Patches and  Tramadol 50 mg one tablet every 8 hours as needed fpr pain #90. She lives in  [facility], they are prescribing her Tramadol. They don't report to the PMP.  4. Polyarthralgia: Continue HEP as tolerated. Continue to Monitor. 04/16/2023   F/U in 6  Months

## 2023-04-16 ENCOUNTER — Encounter: Payer: Medicare PPO | Attending: Registered Nurse | Admitting: Registered Nurse

## 2023-04-16 ENCOUNTER — Encounter: Payer: Self-pay | Admitting: Registered Nurse

## 2023-04-16 VITALS — BP 119/74 | HR 64 | Ht 66.0 in

## 2023-04-16 DIAGNOSIS — Z79899 Other long term (current) drug therapy: Secondary | ICD-10-CM | POA: Insufficient documentation

## 2023-04-16 DIAGNOSIS — Z5181 Encounter for therapeutic drug level monitoring: Secondary | ICD-10-CM | POA: Insufficient documentation

## 2023-04-16 DIAGNOSIS — M6281 Muscle weakness (generalized): Secondary | ICD-10-CM | POA: Diagnosis not present

## 2023-04-16 DIAGNOSIS — R278 Other lack of coordination: Secondary | ICD-10-CM | POA: Diagnosis not present

## 2023-04-16 DIAGNOSIS — M069 Rheumatoid arthritis, unspecified: Secondary | ICD-10-CM | POA: Diagnosis not present

## 2023-04-16 DIAGNOSIS — M48061 Spinal stenosis, lumbar region without neurogenic claudication: Secondary | ICD-10-CM | POA: Diagnosis not present

## 2023-04-16 DIAGNOSIS — G894 Chronic pain syndrome: Secondary | ICD-10-CM | POA: Insufficient documentation

## 2023-04-16 DIAGNOSIS — M2550 Pain in unspecified joint: Secondary | ICD-10-CM | POA: Diagnosis not present

## 2023-04-16 DIAGNOSIS — M797 Fibromyalgia: Secondary | ICD-10-CM | POA: Diagnosis not present

## 2023-04-17 DIAGNOSIS — M797 Fibromyalgia: Secondary | ICD-10-CM | POA: Diagnosis not present

## 2023-04-17 DIAGNOSIS — M6281 Muscle weakness (generalized): Secondary | ICD-10-CM | POA: Diagnosis not present

## 2023-04-17 DIAGNOSIS — R278 Other lack of coordination: Secondary | ICD-10-CM | POA: Diagnosis not present

## 2023-04-17 DIAGNOSIS — G894 Chronic pain syndrome: Secondary | ICD-10-CM | POA: Diagnosis not present

## 2023-04-17 DIAGNOSIS — M069 Rheumatoid arthritis, unspecified: Secondary | ICD-10-CM | POA: Diagnosis not present

## 2023-04-18 DIAGNOSIS — R278 Other lack of coordination: Secondary | ICD-10-CM | POA: Diagnosis not present

## 2023-04-18 DIAGNOSIS — G894 Chronic pain syndrome: Secondary | ICD-10-CM | POA: Diagnosis not present

## 2023-04-18 DIAGNOSIS — M069 Rheumatoid arthritis, unspecified: Secondary | ICD-10-CM | POA: Diagnosis not present

## 2023-04-18 DIAGNOSIS — M6281 Muscle weakness (generalized): Secondary | ICD-10-CM | POA: Diagnosis not present

## 2023-04-18 DIAGNOSIS — M797 Fibromyalgia: Secondary | ICD-10-CM | POA: Diagnosis not present

## 2023-04-20 DIAGNOSIS — M797 Fibromyalgia: Secondary | ICD-10-CM | POA: Diagnosis not present

## 2023-04-20 DIAGNOSIS — R278 Other lack of coordination: Secondary | ICD-10-CM | POA: Diagnosis not present

## 2023-04-20 DIAGNOSIS — M069 Rheumatoid arthritis, unspecified: Secondary | ICD-10-CM | POA: Diagnosis not present

## 2023-04-20 DIAGNOSIS — G894 Chronic pain syndrome: Secondary | ICD-10-CM | POA: Diagnosis not present

## 2023-04-20 DIAGNOSIS — M6281 Muscle weakness (generalized): Secondary | ICD-10-CM | POA: Diagnosis not present

## 2023-04-21 DIAGNOSIS — R278 Other lack of coordination: Secondary | ICD-10-CM | POA: Diagnosis not present

## 2023-04-21 DIAGNOSIS — M069 Rheumatoid arthritis, unspecified: Secondary | ICD-10-CM | POA: Diagnosis not present

## 2023-04-21 DIAGNOSIS — G894 Chronic pain syndrome: Secondary | ICD-10-CM | POA: Diagnosis not present

## 2023-04-21 DIAGNOSIS — M797 Fibromyalgia: Secondary | ICD-10-CM | POA: Diagnosis not present

## 2023-04-21 DIAGNOSIS — M6281 Muscle weakness (generalized): Secondary | ICD-10-CM | POA: Diagnosis not present

## 2023-04-21 LAB — DRUG TOX MONITOR 1 W/CONF, ORAL FLD
Alprazolam: 0.62 ng/mL — ABNORMAL HIGH (ref <0.50)
Amphetamines: NEGATIVE ng/mL (ref <10)
Barbiturates: NEGATIVE ng/mL (ref <10)
Benzodiazepines: POSITIVE ng/mL — AB (ref <0.50)
Buprenorphine: NEGATIVE ng/mL (ref <0.10)
Chlordiazepoxide: NEGATIVE ng/mL (ref <0.50)
Clonazepam: NEGATIVE ng/mL (ref <0.50)
Cocaine: NEGATIVE ng/mL (ref <5.0)
Diazepam: NEGATIVE ng/mL (ref <0.50)
Fentanyl: NEGATIVE ng/mL (ref <0.10)
Flunitrazepam: NEGATIVE ng/mL (ref <0.50)
Flurazepam: NEGATIVE ng/mL (ref <0.50)
Heroin Metabolite: NEGATIVE ng/mL (ref <1.0)
Lorazepam: NEGATIVE ng/mL (ref <0.50)
MARIJUANA: NEGATIVE ng/mL (ref <2.5)
MDMA: NEGATIVE ng/mL (ref <10)
Meprobamate: NEGATIVE ng/mL (ref <2.5)
Methadone: NEGATIVE ng/mL (ref <5.0)
Midazolam: NEGATIVE ng/mL (ref <0.50)
Nicotine Metabolite: NEGATIVE ng/mL (ref <5.0)
Nordiazepam: NEGATIVE ng/mL (ref <0.50)
Opiates: NEGATIVE ng/mL (ref <2.5)
Oxazepam: NEGATIVE ng/mL (ref <0.50)
Phencyclidine: NEGATIVE ng/mL (ref <10)
Tapentadol: NEGATIVE ng/mL (ref <5.0)
Temazepam: NEGATIVE ng/mL (ref <0.50)
Tramadol: >500 ng/mL — ABNORMAL HIGH (ref <5.0)
Tramadol: POSITIVE ng/mL — AB (ref <5.0)
Triazolam: NEGATIVE ng/mL (ref <0.50)
Zolpidem: NEGATIVE ng/mL (ref <5.0)

## 2023-04-21 LAB — DRUG TOX ALC METAB W/CON, ORAL FLD: Alcohol Metabolite: NEGATIVE ng/mL (ref <25)

## 2023-04-22 DIAGNOSIS — R42 Dizziness and giddiness: Secondary | ICD-10-CM | POA: Diagnosis not present

## 2023-04-22 DIAGNOSIS — M069 Rheumatoid arthritis, unspecified: Secondary | ICD-10-CM | POA: Diagnosis not present

## 2023-04-22 DIAGNOSIS — R278 Other lack of coordination: Secondary | ICD-10-CM | POA: Diagnosis not present

## 2023-04-22 DIAGNOSIS — J069 Acute upper respiratory infection, unspecified: Secondary | ICD-10-CM | POA: Diagnosis not present

## 2023-04-22 DIAGNOSIS — M6281 Muscle weakness (generalized): Secondary | ICD-10-CM | POA: Diagnosis not present

## 2023-04-22 DIAGNOSIS — J329 Chronic sinusitis, unspecified: Secondary | ICD-10-CM | POA: Diagnosis not present

## 2023-04-22 DIAGNOSIS — M797 Fibromyalgia: Secondary | ICD-10-CM | POA: Diagnosis not present

## 2023-04-22 DIAGNOSIS — G894 Chronic pain syndrome: Secondary | ICD-10-CM | POA: Diagnosis not present

## 2023-04-24 DIAGNOSIS — R42 Dizziness and giddiness: Secondary | ICD-10-CM | POA: Diagnosis not present

## 2023-04-24 DIAGNOSIS — J329 Chronic sinusitis, unspecified: Secondary | ICD-10-CM | POA: Diagnosis not present

## 2023-04-24 DIAGNOSIS — F411 Generalized anxiety disorder: Secondary | ICD-10-CM | POA: Diagnosis not present

## 2023-04-24 DIAGNOSIS — J069 Acute upper respiratory infection, unspecified: Secondary | ICD-10-CM | POA: Diagnosis not present

## 2023-04-27 DIAGNOSIS — J329 Chronic sinusitis, unspecified: Secondary | ICD-10-CM | POA: Diagnosis not present

## 2023-04-27 DIAGNOSIS — J069 Acute upper respiratory infection, unspecified: Secondary | ICD-10-CM | POA: Diagnosis not present

## 2023-04-29 ENCOUNTER — Other Ambulatory Visit: Payer: Medicare PPO | Admitting: Urology

## 2023-04-29 DIAGNOSIS — R059 Cough, unspecified: Secondary | ICD-10-CM | POA: Diagnosis not present

## 2023-04-29 DIAGNOSIS — J069 Acute upper respiratory infection, unspecified: Secondary | ICD-10-CM | POA: Diagnosis not present

## 2023-04-29 DIAGNOSIS — U071 COVID-19: Secondary | ICD-10-CM | POA: Diagnosis not present

## 2023-05-01 DIAGNOSIS — J069 Acute upper respiratory infection, unspecified: Secondary | ICD-10-CM | POA: Diagnosis not present

## 2023-05-01 DIAGNOSIS — U071 COVID-19: Secondary | ICD-10-CM | POA: Diagnosis not present

## 2023-05-04 DIAGNOSIS — U071 COVID-19: Secondary | ICD-10-CM | POA: Diagnosis not present

## 2023-05-04 DIAGNOSIS — J069 Acute upper respiratory infection, unspecified: Secondary | ICD-10-CM | POA: Diagnosis not present

## 2023-05-08 DIAGNOSIS — G894 Chronic pain syndrome: Secondary | ICD-10-CM | POA: Diagnosis not present

## 2023-05-08 DIAGNOSIS — M797 Fibromyalgia: Secondary | ICD-10-CM | POA: Diagnosis not present

## 2023-05-11 DIAGNOSIS — N3020 Other chronic cystitis without hematuria: Secondary | ICD-10-CM | POA: Diagnosis not present

## 2023-05-11 DIAGNOSIS — I679 Cerebrovascular disease, unspecified: Secondary | ICD-10-CM | POA: Diagnosis not present

## 2023-05-11 DIAGNOSIS — I10 Essential (primary) hypertension: Secondary | ICD-10-CM | POA: Diagnosis not present

## 2023-05-14 DIAGNOSIS — I10 Essential (primary) hypertension: Secondary | ICD-10-CM | POA: Diagnosis not present

## 2023-05-14 DIAGNOSIS — N3020 Other chronic cystitis without hematuria: Secondary | ICD-10-CM | POA: Diagnosis not present

## 2023-05-14 DIAGNOSIS — J069 Acute upper respiratory infection, unspecified: Secondary | ICD-10-CM | POA: Diagnosis not present

## 2023-05-14 DIAGNOSIS — F411 Generalized anxiety disorder: Secondary | ICD-10-CM | POA: Diagnosis not present

## 2023-05-14 DIAGNOSIS — G894 Chronic pain syndrome: Secondary | ICD-10-CM | POA: Diagnosis not present

## 2023-05-14 DIAGNOSIS — I679 Cerebrovascular disease, unspecified: Secondary | ICD-10-CM | POA: Diagnosis not present

## 2023-05-14 DIAGNOSIS — U071 COVID-19: Secondary | ICD-10-CM | POA: Diagnosis not present

## 2023-05-14 DIAGNOSIS — M797 Fibromyalgia: Secondary | ICD-10-CM | POA: Diagnosis not present

## 2023-05-15 DIAGNOSIS — F331 Major depressive disorder, recurrent, moderate: Secondary | ICD-10-CM | POA: Diagnosis not present

## 2023-05-15 DIAGNOSIS — R946 Abnormal results of thyroid function studies: Secondary | ICD-10-CM | POA: Diagnosis not present

## 2023-05-15 DIAGNOSIS — F411 Generalized anxiety disorder: Secondary | ICD-10-CM | POA: Diagnosis not present

## 2023-05-15 DIAGNOSIS — G4700 Insomnia, unspecified: Secondary | ICD-10-CM | POA: Diagnosis not present

## 2023-05-15 DIAGNOSIS — R7881 Bacteremia: Secondary | ICD-10-CM | POA: Diagnosis not present

## 2023-05-15 DIAGNOSIS — J069 Acute upper respiratory infection, unspecified: Secondary | ICD-10-CM | POA: Diagnosis not present

## 2023-05-15 DIAGNOSIS — J329 Chronic sinusitis, unspecified: Secondary | ICD-10-CM | POA: Diagnosis not present

## 2023-05-20 DIAGNOSIS — R42 Dizziness and giddiness: Secondary | ICD-10-CM | POA: Diagnosis not present

## 2023-05-20 DIAGNOSIS — J069 Acute upper respiratory infection, unspecified: Secondary | ICD-10-CM | POA: Diagnosis not present

## 2023-05-22 DIAGNOSIS — F411 Generalized anxiety disorder: Secondary | ICD-10-CM | POA: Diagnosis not present

## 2023-05-27 DIAGNOSIS — F411 Generalized anxiety disorder: Secondary | ICD-10-CM | POA: Diagnosis not present

## 2023-05-28 DIAGNOSIS — M069 Rheumatoid arthritis, unspecified: Secondary | ICD-10-CM | POA: Diagnosis not present

## 2023-05-28 DIAGNOSIS — M6281 Muscle weakness (generalized): Secondary | ICD-10-CM | POA: Diagnosis not present

## 2023-05-28 DIAGNOSIS — M797 Fibromyalgia: Secondary | ICD-10-CM | POA: Diagnosis not present

## 2023-05-28 DIAGNOSIS — G894 Chronic pain syndrome: Secondary | ICD-10-CM | POA: Diagnosis not present

## 2023-05-29 DIAGNOSIS — I959 Hypotension, unspecified: Secondary | ICD-10-CM | POA: Diagnosis not present

## 2023-06-01 DIAGNOSIS — M069 Rheumatoid arthritis, unspecified: Secondary | ICD-10-CM | POA: Diagnosis not present

## 2023-06-01 DIAGNOSIS — G894 Chronic pain syndrome: Secondary | ICD-10-CM | POA: Diagnosis not present

## 2023-06-01 DIAGNOSIS — M797 Fibromyalgia: Secondary | ICD-10-CM | POA: Diagnosis not present

## 2023-06-02 DIAGNOSIS — M797 Fibromyalgia: Secondary | ICD-10-CM | POA: Diagnosis not present

## 2023-06-02 DIAGNOSIS — J069 Acute upper respiratory infection, unspecified: Secondary | ICD-10-CM | POA: Diagnosis not present

## 2023-06-02 DIAGNOSIS — M069 Rheumatoid arthritis, unspecified: Secondary | ICD-10-CM | POA: Diagnosis not present

## 2023-06-02 DIAGNOSIS — G894 Chronic pain syndrome: Secondary | ICD-10-CM | POA: Diagnosis not present

## 2023-06-05 DIAGNOSIS — M797 Fibromyalgia: Secondary | ICD-10-CM | POA: Diagnosis not present

## 2023-06-05 DIAGNOSIS — G894 Chronic pain syndrome: Secondary | ICD-10-CM | POA: Diagnosis not present

## 2023-06-05 DIAGNOSIS — M069 Rheumatoid arthritis, unspecified: Secondary | ICD-10-CM | POA: Diagnosis not present

## 2023-06-08 DIAGNOSIS — G894 Chronic pain syndrome: Secondary | ICD-10-CM | POA: Diagnosis not present

## 2023-06-08 DIAGNOSIS — M069 Rheumatoid arthritis, unspecified: Secondary | ICD-10-CM | POA: Diagnosis not present

## 2023-06-08 DIAGNOSIS — M6281 Muscle weakness (generalized): Secondary | ICD-10-CM | POA: Diagnosis not present

## 2023-06-08 DIAGNOSIS — M797 Fibromyalgia: Secondary | ICD-10-CM | POA: Diagnosis not present

## 2023-06-11 DIAGNOSIS — M79671 Pain in right foot: Secondary | ICD-10-CM | POA: Diagnosis not present

## 2023-06-11 DIAGNOSIS — M797 Fibromyalgia: Secondary | ICD-10-CM | POA: Diagnosis not present

## 2023-06-11 DIAGNOSIS — G894 Chronic pain syndrome: Secondary | ICD-10-CM | POA: Diagnosis not present

## 2023-06-11 DIAGNOSIS — M069 Rheumatoid arthritis, unspecified: Secondary | ICD-10-CM | POA: Diagnosis not present

## 2023-06-11 DIAGNOSIS — M6281 Muscle weakness (generalized): Secondary | ICD-10-CM | POA: Diagnosis not present

## 2023-06-14 DIAGNOSIS — G894 Chronic pain syndrome: Secondary | ICD-10-CM | POA: Diagnosis not present

## 2023-06-14 DIAGNOSIS — M069 Rheumatoid arthritis, unspecified: Secondary | ICD-10-CM | POA: Diagnosis not present

## 2023-06-14 DIAGNOSIS — M6281 Muscle weakness (generalized): Secondary | ICD-10-CM | POA: Diagnosis not present

## 2023-06-14 DIAGNOSIS — M797 Fibromyalgia: Secondary | ICD-10-CM | POA: Diagnosis not present

## 2023-06-16 DIAGNOSIS — B351 Tinea unguium: Secondary | ICD-10-CM | POA: Diagnosis not present

## 2023-06-16 DIAGNOSIS — M79675 Pain in left toe(s): Secondary | ICD-10-CM | POA: Diagnosis not present

## 2023-06-16 DIAGNOSIS — M79674 Pain in right toe(s): Secondary | ICD-10-CM | POA: Diagnosis not present

## 2023-06-17 DIAGNOSIS — M797 Fibromyalgia: Secondary | ICD-10-CM | POA: Diagnosis not present

## 2023-06-17 DIAGNOSIS — G894 Chronic pain syndrome: Secondary | ICD-10-CM | POA: Diagnosis not present

## 2023-06-17 DIAGNOSIS — M6281 Muscle weakness (generalized): Secondary | ICD-10-CM | POA: Diagnosis not present

## 2023-06-17 DIAGNOSIS — M069 Rheumatoid arthritis, unspecified: Secondary | ICD-10-CM | POA: Diagnosis not present

## 2023-06-18 DIAGNOSIS — R059 Cough, unspecified: Secondary | ICD-10-CM | POA: Diagnosis not present

## 2023-06-22 DIAGNOSIS — M6281 Muscle weakness (generalized): Secondary | ICD-10-CM | POA: Diagnosis not present

## 2023-06-22 DIAGNOSIS — M797 Fibromyalgia: Secondary | ICD-10-CM | POA: Diagnosis not present

## 2023-06-22 DIAGNOSIS — R059 Cough, unspecified: Secondary | ICD-10-CM | POA: Diagnosis not present

## 2023-06-22 DIAGNOSIS — G894 Chronic pain syndrome: Secondary | ICD-10-CM | POA: Diagnosis not present

## 2023-06-22 DIAGNOSIS — M069 Rheumatoid arthritis, unspecified: Secondary | ICD-10-CM | POA: Diagnosis not present

## 2023-06-23 DIAGNOSIS — M797 Fibromyalgia: Secondary | ICD-10-CM | POA: Diagnosis not present

## 2023-06-23 DIAGNOSIS — M6281 Muscle weakness (generalized): Secondary | ICD-10-CM | POA: Diagnosis not present

## 2023-06-23 DIAGNOSIS — G894 Chronic pain syndrome: Secondary | ICD-10-CM | POA: Diagnosis not present

## 2023-06-23 DIAGNOSIS — M069 Rheumatoid arthritis, unspecified: Secondary | ICD-10-CM | POA: Diagnosis not present

## 2023-06-24 DIAGNOSIS — M069 Rheumatoid arthritis, unspecified: Secondary | ICD-10-CM | POA: Diagnosis not present

## 2023-06-24 DIAGNOSIS — M797 Fibromyalgia: Secondary | ICD-10-CM | POA: Diagnosis not present

## 2023-06-24 DIAGNOSIS — R0989 Other specified symptoms and signs involving the circulatory and respiratory systems: Secondary | ICD-10-CM | POA: Diagnosis not present

## 2023-06-24 DIAGNOSIS — G894 Chronic pain syndrome: Secondary | ICD-10-CM | POA: Diagnosis not present

## 2023-06-24 DIAGNOSIS — M6281 Muscle weakness (generalized): Secondary | ICD-10-CM | POA: Diagnosis not present

## 2023-06-25 DIAGNOSIS — M6281 Muscle weakness (generalized): Secondary | ICD-10-CM | POA: Diagnosis not present

## 2023-06-25 DIAGNOSIS — M069 Rheumatoid arthritis, unspecified: Secondary | ICD-10-CM | POA: Diagnosis not present

## 2023-06-25 DIAGNOSIS — G894 Chronic pain syndrome: Secondary | ICD-10-CM | POA: Diagnosis not present

## 2023-06-25 DIAGNOSIS — M797 Fibromyalgia: Secondary | ICD-10-CM | POA: Diagnosis not present

## 2023-06-26 DIAGNOSIS — M797 Fibromyalgia: Secondary | ICD-10-CM | POA: Diagnosis not present

## 2023-06-26 DIAGNOSIS — M6281 Muscle weakness (generalized): Secondary | ICD-10-CM | POA: Diagnosis not present

## 2023-06-26 DIAGNOSIS — M069 Rheumatoid arthritis, unspecified: Secondary | ICD-10-CM | POA: Diagnosis not present

## 2023-06-26 DIAGNOSIS — G894 Chronic pain syndrome: Secondary | ICD-10-CM | POA: Diagnosis not present

## 2023-07-01 DIAGNOSIS — M6281 Muscle weakness (generalized): Secondary | ICD-10-CM | POA: Diagnosis not present

## 2023-07-01 DIAGNOSIS — M069 Rheumatoid arthritis, unspecified: Secondary | ICD-10-CM | POA: Diagnosis not present

## 2023-07-01 DIAGNOSIS — G894 Chronic pain syndrome: Secondary | ICD-10-CM | POA: Diagnosis not present

## 2023-07-01 DIAGNOSIS — M797 Fibromyalgia: Secondary | ICD-10-CM | POA: Diagnosis not present

## 2023-07-03 DIAGNOSIS — F411 Generalized anxiety disorder: Secondary | ICD-10-CM | POA: Diagnosis not present

## 2023-07-06 ENCOUNTER — Other Ambulatory Visit: Payer: Self-pay

## 2023-07-06 ENCOUNTER — Other Ambulatory Visit: Payer: Medicare PPO | Admitting: Urology

## 2023-07-06 DIAGNOSIS — R0989 Other specified symptoms and signs involving the circulatory and respiratory systems: Secondary | ICD-10-CM | POA: Diagnosis not present

## 2023-07-06 DIAGNOSIS — N3281 Overactive bladder: Secondary | ICD-10-CM

## 2023-07-06 DIAGNOSIS — N390 Urinary tract infection, site not specified: Secondary | ICD-10-CM

## 2023-07-07 DIAGNOSIS — M069 Rheumatoid arthritis, unspecified: Secondary | ICD-10-CM | POA: Diagnosis not present

## 2023-07-07 DIAGNOSIS — G894 Chronic pain syndrome: Secondary | ICD-10-CM | POA: Diagnosis not present

## 2023-07-07 DIAGNOSIS — M6281 Muscle weakness (generalized): Secondary | ICD-10-CM | POA: Diagnosis not present

## 2023-07-07 DIAGNOSIS — M797 Fibromyalgia: Secondary | ICD-10-CM | POA: Diagnosis not present

## 2023-07-08 DIAGNOSIS — G894 Chronic pain syndrome: Secondary | ICD-10-CM | POA: Diagnosis not present

## 2023-07-10 ENCOUNTER — Telehealth: Payer: Self-pay

## 2023-07-10 NOTE — Telephone Encounter (Signed)
Please see pt request below.  Please forward back to clinical pool.

## 2023-07-10 NOTE — Telephone Encounter (Signed)
Patient left a voice message 07-09-2023.  Patient is having chills and hurting during urination.    Asking if her preventative medication may need to be increased.  She is on upper [place] at her facility.  Please advise.

## 2023-07-13 DIAGNOSIS — J09X2 Influenza due to identified novel influenza A virus with other respiratory manifestations: Secondary | ICD-10-CM | POA: Diagnosis not present

## 2023-07-13 NOTE — Telephone Encounter (Signed)
  A) For acute symptoms like that her SNF staff will need to address. ER if severe. B) Please clarify which medication she's asking about.  Return call to patient and she states that she would like her preventative macrodantin increased. Patient states that the staff haven't given her macrodantin in four days and she believe this is why she was hurting during voiding. Patient is aware a message will be send to Maralyn Sago on recommendation. Patient verbalized understanding.

## 2023-07-14 DIAGNOSIS — M797 Fibromyalgia: Secondary | ICD-10-CM | POA: Diagnosis not present

## 2023-07-14 DIAGNOSIS — M069 Rheumatoid arthritis, unspecified: Secondary | ICD-10-CM | POA: Diagnosis not present

## 2023-07-14 DIAGNOSIS — G894 Chronic pain syndrome: Secondary | ICD-10-CM | POA: Diagnosis not present

## 2023-07-14 DIAGNOSIS — M6281 Muscle weakness (generalized): Secondary | ICD-10-CM | POA: Diagnosis not present

## 2023-07-14 NOTE — Telephone Encounter (Signed)
Patient was made aware"I don't understand why increasing dose is needed - sounds like it just needs to be administered as ordered??" Patient verbalized understanding.

## 2023-07-15 DIAGNOSIS — R6889 Other general symptoms and signs: Secondary | ICD-10-CM | POA: Diagnosis not present

## 2023-07-16 DIAGNOSIS — M6281 Muscle weakness (generalized): Secondary | ICD-10-CM | POA: Diagnosis not present

## 2023-07-16 DIAGNOSIS — M797 Fibromyalgia: Secondary | ICD-10-CM | POA: Diagnosis not present

## 2023-07-16 DIAGNOSIS — R509 Fever, unspecified: Secondary | ICD-10-CM | POA: Diagnosis not present

## 2023-07-16 DIAGNOSIS — G894 Chronic pain syndrome: Secondary | ICD-10-CM | POA: Diagnosis not present

## 2023-07-16 DIAGNOSIS — J1000 Influenza due to other identified influenza virus with unspecified type of pneumonia: Secondary | ICD-10-CM | POA: Diagnosis not present

## 2023-07-16 DIAGNOSIS — M069 Rheumatoid arthritis, unspecified: Secondary | ICD-10-CM | POA: Diagnosis not present

## 2023-07-17 DIAGNOSIS — G894 Chronic pain syndrome: Secondary | ICD-10-CM | POA: Diagnosis not present

## 2023-07-17 DIAGNOSIS — M6281 Muscle weakness (generalized): Secondary | ICD-10-CM | POA: Diagnosis not present

## 2023-07-17 DIAGNOSIS — M069 Rheumatoid arthritis, unspecified: Secondary | ICD-10-CM | POA: Diagnosis not present

## 2023-07-17 DIAGNOSIS — J069 Acute upper respiratory infection, unspecified: Secondary | ICD-10-CM | POA: Diagnosis not present

## 2023-07-17 DIAGNOSIS — G4700 Insomnia, unspecified: Secondary | ICD-10-CM | POA: Diagnosis not present

## 2023-07-17 DIAGNOSIS — R6889 Other general symptoms and signs: Secondary | ICD-10-CM | POA: Diagnosis not present

## 2023-07-17 DIAGNOSIS — M797 Fibromyalgia: Secondary | ICD-10-CM | POA: Diagnosis not present

## 2023-07-17 DIAGNOSIS — F32A Depression, unspecified: Secondary | ICD-10-CM | POA: Diagnosis not present

## 2023-07-21 DIAGNOSIS — G894 Chronic pain syndrome: Secondary | ICD-10-CM | POA: Diagnosis not present

## 2023-07-21 DIAGNOSIS — M069 Rheumatoid arthritis, unspecified: Secondary | ICD-10-CM | POA: Diagnosis not present

## 2023-07-21 DIAGNOSIS — M6281 Muscle weakness (generalized): Secondary | ICD-10-CM | POA: Diagnosis not present

## 2023-07-21 DIAGNOSIS — M797 Fibromyalgia: Secondary | ICD-10-CM | POA: Diagnosis not present

## 2023-07-22 DIAGNOSIS — F411 Generalized anxiety disorder: Secondary | ICD-10-CM | POA: Diagnosis not present

## 2023-07-23 DIAGNOSIS — N952 Postmenopausal atrophic vaginitis: Secondary | ICD-10-CM | POA: Diagnosis not present

## 2023-07-23 DIAGNOSIS — R1084 Generalized abdominal pain: Secondary | ICD-10-CM | POA: Diagnosis not present

## 2023-07-23 DIAGNOSIS — N3020 Other chronic cystitis without hematuria: Secondary | ICD-10-CM | POA: Diagnosis not present

## 2023-07-24 DIAGNOSIS — F411 Generalized anxiety disorder: Secondary | ICD-10-CM | POA: Diagnosis not present

## 2023-07-28 DIAGNOSIS — N3020 Other chronic cystitis without hematuria: Secondary | ICD-10-CM | POA: Diagnosis not present

## 2023-07-28 DIAGNOSIS — I10 Essential (primary) hypertension: Secondary | ICD-10-CM | POA: Diagnosis not present

## 2023-07-28 DIAGNOSIS — I679 Cerebrovascular disease, unspecified: Secondary | ICD-10-CM | POA: Diagnosis not present

## 2023-07-31 DIAGNOSIS — F411 Generalized anxiety disorder: Secondary | ICD-10-CM | POA: Diagnosis not present

## 2023-08-05 DIAGNOSIS — G894 Chronic pain syndrome: Secondary | ICD-10-CM | POA: Diagnosis not present

## 2023-08-06 DIAGNOSIS — G4700 Insomnia, unspecified: Secondary | ICD-10-CM | POA: Diagnosis not present

## 2023-08-06 DIAGNOSIS — F32A Depression, unspecified: Secondary | ICD-10-CM | POA: Diagnosis not present

## 2023-08-07 DIAGNOSIS — J811 Chronic pulmonary edema: Secondary | ICD-10-CM | POA: Diagnosis not present

## 2023-08-14 DIAGNOSIS — K8689 Other specified diseases of pancreas: Secondary | ICD-10-CM | POA: Diagnosis not present

## 2023-08-14 DIAGNOSIS — R109 Unspecified abdominal pain: Secondary | ICD-10-CM | POA: Diagnosis not present

## 2023-08-14 DIAGNOSIS — N3020 Other chronic cystitis without hematuria: Secondary | ICD-10-CM | POA: Diagnosis not present

## 2023-08-14 DIAGNOSIS — K5730 Diverticulosis of large intestine without perforation or abscess without bleeding: Secondary | ICD-10-CM | POA: Diagnosis not present

## 2023-08-19 DIAGNOSIS — F411 Generalized anxiety disorder: Secondary | ICD-10-CM | POA: Diagnosis not present

## 2023-08-24 DIAGNOSIS — I679 Cerebrovascular disease, unspecified: Secondary | ICD-10-CM | POA: Diagnosis not present

## 2023-08-24 DIAGNOSIS — I10 Essential (primary) hypertension: Secondary | ICD-10-CM | POA: Diagnosis not present

## 2023-08-24 DIAGNOSIS — N3020 Other chronic cystitis without hematuria: Secondary | ICD-10-CM | POA: Diagnosis not present

## 2023-08-24 DIAGNOSIS — R0989 Other specified symptoms and signs involving the circulatory and respiratory systems: Secondary | ICD-10-CM | POA: Diagnosis not present

## 2023-08-25 ENCOUNTER — Other Ambulatory Visit: Payer: Self-pay | Admitting: Orthopaedic Surgery

## 2023-08-25 DIAGNOSIS — S9781XA Crushing injury of right foot, initial encounter: Secondary | ICD-10-CM

## 2023-08-25 DIAGNOSIS — M722 Plantar fascial fibromatosis: Secondary | ICD-10-CM | POA: Diagnosis not present

## 2023-08-27 DIAGNOSIS — N3020 Other chronic cystitis without hematuria: Secondary | ICD-10-CM | POA: Diagnosis not present

## 2023-08-27 DIAGNOSIS — N952 Postmenopausal atrophic vaginitis: Secondary | ICD-10-CM | POA: Diagnosis not present

## 2023-08-28 DIAGNOSIS — M6281 Muscle weakness (generalized): Secondary | ICD-10-CM | POA: Diagnosis not present

## 2023-08-28 DIAGNOSIS — M069 Rheumatoid arthritis, unspecified: Secondary | ICD-10-CM | POA: Diagnosis not present

## 2023-08-28 DIAGNOSIS — R278 Other lack of coordination: Secondary | ICD-10-CM | POA: Diagnosis not present

## 2023-09-01 DIAGNOSIS — B351 Tinea unguium: Secondary | ICD-10-CM | POA: Diagnosis not present

## 2023-09-01 DIAGNOSIS — M79675 Pain in left toe(s): Secondary | ICD-10-CM | POA: Diagnosis not present

## 2023-09-01 DIAGNOSIS — M79674 Pain in right toe(s): Secondary | ICD-10-CM | POA: Diagnosis not present

## 2023-09-02 DIAGNOSIS — M797 Fibromyalgia: Secondary | ICD-10-CM | POA: Diagnosis not present

## 2023-09-02 DIAGNOSIS — G894 Chronic pain syndrome: Secondary | ICD-10-CM | POA: Diagnosis not present

## 2023-09-08 DIAGNOSIS — M6281 Muscle weakness (generalized): Secondary | ICD-10-CM | POA: Diagnosis not present

## 2023-09-08 DIAGNOSIS — R278 Other lack of coordination: Secondary | ICD-10-CM | POA: Diagnosis not present

## 2023-09-08 DIAGNOSIS — M069 Rheumatoid arthritis, unspecified: Secondary | ICD-10-CM | POA: Diagnosis not present

## 2023-09-09 DIAGNOSIS — M069 Rheumatoid arthritis, unspecified: Secondary | ICD-10-CM | POA: Diagnosis not present

## 2023-09-09 DIAGNOSIS — M6281 Muscle weakness (generalized): Secondary | ICD-10-CM | POA: Diagnosis not present

## 2023-09-09 DIAGNOSIS — R278 Other lack of coordination: Secondary | ICD-10-CM | POA: Diagnosis not present

## 2023-09-10 DIAGNOSIS — I501 Left ventricular failure: Secondary | ICD-10-CM | POA: Diagnosis not present

## 2023-09-10 DIAGNOSIS — I10 Essential (primary) hypertension: Secondary | ICD-10-CM | POA: Diagnosis not present

## 2023-09-11 DIAGNOSIS — M069 Rheumatoid arthritis, unspecified: Secondary | ICD-10-CM | POA: Diagnosis not present

## 2023-09-11 DIAGNOSIS — R278 Other lack of coordination: Secondary | ICD-10-CM | POA: Diagnosis not present

## 2023-09-11 DIAGNOSIS — M6281 Muscle weakness (generalized): Secondary | ICD-10-CM | POA: Diagnosis not present

## 2023-09-14 DIAGNOSIS — M6281 Muscle weakness (generalized): Secondary | ICD-10-CM | POA: Diagnosis not present

## 2023-09-14 DIAGNOSIS — M069 Rheumatoid arthritis, unspecified: Secondary | ICD-10-CM | POA: Diagnosis not present

## 2023-09-14 DIAGNOSIS — R278 Other lack of coordination: Secondary | ICD-10-CM | POA: Diagnosis not present

## 2023-09-16 DIAGNOSIS — M6281 Muscle weakness (generalized): Secondary | ICD-10-CM | POA: Diagnosis not present

## 2023-09-16 DIAGNOSIS — R278 Other lack of coordination: Secondary | ICD-10-CM | POA: Diagnosis not present

## 2023-09-16 DIAGNOSIS — M069 Rheumatoid arthritis, unspecified: Secondary | ICD-10-CM | POA: Diagnosis not present

## 2023-09-18 ENCOUNTER — Ambulatory Visit
Admission: RE | Admit: 2023-09-18 | Discharge: 2023-09-18 | Disposition: A | Discharge status: Home / Self Care | Source: Ambulatory Visit | Attending: Orthopaedic Surgery | Admitting: Orthopaedic Surgery

## 2023-09-18 DIAGNOSIS — M21541 Acquired clubfoot, right foot: Secondary | ICD-10-CM | POA: Diagnosis not present

## 2023-09-18 DIAGNOSIS — S9781XA Crushing injury of right foot, initial encounter: Secondary | ICD-10-CM

## 2023-09-21 DIAGNOSIS — I679 Cerebrovascular disease, unspecified: Secondary | ICD-10-CM | POA: Diagnosis not present

## 2023-09-21 DIAGNOSIS — R0989 Other specified symptoms and signs involving the circulatory and respiratory systems: Secondary | ICD-10-CM | POA: Diagnosis not present

## 2023-09-21 DIAGNOSIS — I10 Essential (primary) hypertension: Secondary | ICD-10-CM | POA: Diagnosis not present

## 2023-09-21 DIAGNOSIS — N3020 Other chronic cystitis without hematuria: Secondary | ICD-10-CM | POA: Diagnosis not present

## 2023-09-22 DIAGNOSIS — F331 Major depressive disorder, recurrent, moderate: Secondary | ICD-10-CM | POA: Diagnosis not present

## 2023-09-22 DIAGNOSIS — G4700 Insomnia, unspecified: Secondary | ICD-10-CM | POA: Diagnosis not present

## 2023-09-22 DIAGNOSIS — R278 Other lack of coordination: Secondary | ICD-10-CM | POA: Diagnosis not present

## 2023-09-22 DIAGNOSIS — M069 Rheumatoid arthritis, unspecified: Secondary | ICD-10-CM | POA: Diagnosis not present

## 2023-09-22 DIAGNOSIS — F411 Generalized anxiety disorder: Secondary | ICD-10-CM | POA: Diagnosis not present

## 2023-09-22 DIAGNOSIS — M6281 Muscle weakness (generalized): Secondary | ICD-10-CM | POA: Diagnosis not present

## 2023-09-23 DIAGNOSIS — M069 Rheumatoid arthritis, unspecified: Secondary | ICD-10-CM | POA: Diagnosis not present

## 2023-09-23 DIAGNOSIS — F411 Generalized anxiety disorder: Secondary | ICD-10-CM | POA: Diagnosis not present

## 2023-09-23 DIAGNOSIS — R278 Other lack of coordination: Secondary | ICD-10-CM | POA: Diagnosis not present

## 2023-09-23 DIAGNOSIS — M6281 Muscle weakness (generalized): Secondary | ICD-10-CM | POA: Diagnosis not present

## 2023-09-24 DIAGNOSIS — M069 Rheumatoid arthritis, unspecified: Secondary | ICD-10-CM | POA: Diagnosis not present

## 2023-09-24 DIAGNOSIS — R278 Other lack of coordination: Secondary | ICD-10-CM | POA: Diagnosis not present

## 2023-09-24 DIAGNOSIS — M6281 Muscle weakness (generalized): Secondary | ICD-10-CM | POA: Diagnosis not present

## 2023-09-25 DIAGNOSIS — M6281 Muscle weakness (generalized): Secondary | ICD-10-CM | POA: Diagnosis not present

## 2023-09-25 DIAGNOSIS — M069 Rheumatoid arthritis, unspecified: Secondary | ICD-10-CM | POA: Diagnosis not present

## 2023-09-25 DIAGNOSIS — R278 Other lack of coordination: Secondary | ICD-10-CM | POA: Diagnosis not present

## 2023-09-26 DIAGNOSIS — R278 Other lack of coordination: Secondary | ICD-10-CM | POA: Diagnosis not present

## 2023-09-26 DIAGNOSIS — M069 Rheumatoid arthritis, unspecified: Secondary | ICD-10-CM | POA: Diagnosis not present

## 2023-09-26 DIAGNOSIS — M6281 Muscle weakness (generalized): Secondary | ICD-10-CM | POA: Diagnosis not present

## 2023-09-28 DIAGNOSIS — M069 Rheumatoid arthritis, unspecified: Secondary | ICD-10-CM | POA: Diagnosis not present

## 2023-09-28 DIAGNOSIS — R278 Other lack of coordination: Secondary | ICD-10-CM | POA: Diagnosis not present

## 2023-09-28 DIAGNOSIS — M6281 Muscle weakness (generalized): Secondary | ICD-10-CM | POA: Diagnosis not present

## 2023-09-30 DIAGNOSIS — G894 Chronic pain syndrome: Secondary | ICD-10-CM | POA: Diagnosis not present

## 2023-09-30 DIAGNOSIS — G4700 Insomnia, unspecified: Secondary | ICD-10-CM | POA: Diagnosis not present

## 2023-09-30 DIAGNOSIS — F32A Depression, unspecified: Secondary | ICD-10-CM | POA: Diagnosis not present

## 2023-10-02 DIAGNOSIS — F411 Generalized anxiety disorder: Secondary | ICD-10-CM | POA: Diagnosis not present

## 2023-10-06 DIAGNOSIS — M6281 Muscle weakness (generalized): Secondary | ICD-10-CM | POA: Diagnosis not present

## 2023-10-06 DIAGNOSIS — M069 Rheumatoid arthritis, unspecified: Secondary | ICD-10-CM | POA: Diagnosis not present

## 2023-10-06 DIAGNOSIS — R278 Other lack of coordination: Secondary | ICD-10-CM | POA: Diagnosis not present

## 2023-10-07 DIAGNOSIS — M6281 Muscle weakness (generalized): Secondary | ICD-10-CM | POA: Diagnosis not present

## 2023-10-07 DIAGNOSIS — R278 Other lack of coordination: Secondary | ICD-10-CM | POA: Diagnosis not present

## 2023-10-07 DIAGNOSIS — M069 Rheumatoid arthritis, unspecified: Secondary | ICD-10-CM | POA: Diagnosis not present

## 2023-10-08 DIAGNOSIS — R278 Other lack of coordination: Secondary | ICD-10-CM | POA: Diagnosis not present

## 2023-10-08 DIAGNOSIS — M069 Rheumatoid arthritis, unspecified: Secondary | ICD-10-CM | POA: Diagnosis not present

## 2023-10-08 DIAGNOSIS — M6281 Muscle weakness (generalized): Secondary | ICD-10-CM | POA: Diagnosis not present

## 2023-10-09 DIAGNOSIS — F32A Depression, unspecified: Secondary | ICD-10-CM | POA: Diagnosis not present

## 2023-10-09 DIAGNOSIS — M069 Rheumatoid arthritis, unspecified: Secondary | ICD-10-CM | POA: Diagnosis not present

## 2023-10-09 DIAGNOSIS — G4700 Insomnia, unspecified: Secondary | ICD-10-CM | POA: Diagnosis not present

## 2023-10-09 DIAGNOSIS — F419 Anxiety disorder, unspecified: Secondary | ICD-10-CM | POA: Diagnosis not present

## 2023-10-09 DIAGNOSIS — R278 Other lack of coordination: Secondary | ICD-10-CM | POA: Diagnosis not present

## 2023-10-09 DIAGNOSIS — M6281 Muscle weakness (generalized): Secondary | ICD-10-CM | POA: Diagnosis not present

## 2023-10-13 ENCOUNTER — Encounter: Payer: Medicare PPO | Attending: Registered Nurse | Admitting: Registered Nurse

## 2023-10-13 ENCOUNTER — Encounter: Payer: Self-pay | Admitting: Registered Nurse

## 2023-10-13 VITALS — BP 122/78 | HR 60 | Ht 66.0 in

## 2023-10-13 DIAGNOSIS — G894 Chronic pain syndrome: Secondary | ICD-10-CM | POA: Diagnosis not present

## 2023-10-13 DIAGNOSIS — M48061 Spinal stenosis, lumbar region without neurogenic claudication: Secondary | ICD-10-CM | POA: Insufficient documentation

## 2023-10-13 DIAGNOSIS — Z5181 Encounter for therapeutic drug level monitoring: Secondary | ICD-10-CM | POA: Diagnosis not present

## 2023-10-13 DIAGNOSIS — M2550 Pain in unspecified joint: Secondary | ICD-10-CM | POA: Insufficient documentation

## 2023-10-13 DIAGNOSIS — G629 Polyneuropathy, unspecified: Secondary | ICD-10-CM | POA: Insufficient documentation

## 2023-10-13 DIAGNOSIS — Z79899 Other long term (current) drug therapy: Secondary | ICD-10-CM | POA: Insufficient documentation

## 2023-10-13 DIAGNOSIS — M797 Fibromyalgia: Secondary | ICD-10-CM | POA: Diagnosis not present

## 2023-10-13 NOTE — Progress Notes (Signed)
 Subjective:    Patient ID: Robin Blackburn, female    DOB: 05-24-1946, 78 y.o.   MRN: [id number]  HPI: Robin Blackburn is a 78 y.o. female who returns for follow up appointment for chronic pain and medication refill. She states her pain is located in her lower back pain, fibro pain, lower extremities bilateral lower extremities and bilateral feet with tingling and burning.She  rates her pain 8. Her current exercise regime is performing stretching exercises and occupational therapy 4 days  a week.   Pain Inventory Average Pain 8 Pain Right Now 8 My pain is dull, stabbing, tingling, and aching  In the last 24 hours, has pain interfered with the following? General activity 5 Relation with others 5 Enjoyment of life 5 What TIME of day is your pain at its worst? morning , daytime, evening, and night Sleep (in general) Poor  Pain is worse with: walking, bending, sitting, and some activites Pain improves with: rest, therapy/exercise, and   Relief from Meds: n/a  Family History  Problem Relation Age of Onset   Hypertension Father    Transient ischemic attack Father    Hypertension Brother    Leukemia Brother    CVA Maternal Grandfather    Social History   Socioeconomic History   Marital status: Divorced    Spouse name: Not on file   Number of children: 0   Years of education: 12   Highest education level: 12th grade  Occupational History   Not on file  Tobacco Use   Smoking status: Never    Passive exposure: Never   Smokeless tobacco: Never  Vaping Use   Vaping status: Never Used  Substance and Sexual Activity   Alcohol  use: No   Drug use: No   Sexual activity: Not Currently  Other Topics Concern   Not on file  Social History Narrative   Not on file   Social Drivers of Health   Financial Resource Strain: Low Risk  (01/06/2022)   Overall Financial Resource Strain (CARDIA)    Difficulty of Paying Living Expenses: Not hard at all  Food Insecurity: No Food Insecurity  (01/06/2022)   Hunger Vital Sign    Worried About Running Out of Food in the Last Year: Never true    Ran Out of Food in the Last Year: Never true  Transportation Needs: No Transportation Needs (01/06/2022)   PRAPARE - Administrator, Civil Service (Medical): No    Lack of Transportation (Non-Medical): No  Physical Activity: Inactive (11/07/2022)   Received from [facility], [facility]   Exercise Vital Sign    Days of Exercise per Week: 0 days    Minutes of Exercise per Session: 0 min  Stress: No Stress Concern Present (01/06/2022)   [employer] of Occupational Health - Occupational Stress Questionnaire    Feeling of Stress : Not at all  Social Connections: Unknown (11/06/2022)   Received from [facility], [facility]   Social Network    Social Network: Not on file   Past Surgical History:  Procedure Laterality Date   ABDOMINAL HYSTERECTOMY     ANKLE RECONSTRUCTION     APPENDECTOMY     BACK SURGERY     CHOLECYSTECTOMY     KNEE SURGERY     Past Surgical History:  Procedure Laterality Date   ABDOMINAL HYSTERECTOMY     ANKLE RECONSTRUCTION     APPENDECTOMY     BACK SURGERY  CHOLECYSTECTOMY     KNEE SURGERY     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   Depression    Fibromyalgia    Hypertension    Neuropathy    Rheumatoid arthritis (HCC)    Stroke (HCC) 02/17/14   BP 122/78 (BP Location: Left Arm, Patient Position: Sitting, Cuff Size: Normal)   Pulse 60   Ht 5\' 6"  (1.676 m)   SpO2 94%   BMI 24.53 kg/m   Opioid Risk Score:   Fall Risk Score:  `1  Depression screen PHQ 2/9     10/13/2023    2:53 PM 04/16/2023    1:30 PM 10/02/2022    1:02 PM 07/03/2022    1:01 PM 03/25/2022    1:54 PM 01/06/2022    9:33 AM 11/22/2021    1:26 PM  Depression screen PHQ 2/9  Decreased Interest 2 0 0 0 1 0 0  Down, Depressed, Hopeless 2 0 0 0 1 1 1   PHQ - 2 Score 4  0 0 0 2 1 1   Altered sleeping 1      0  Tired, decreased energy 1      0  Change in appetite 0      0  Feeling bad or failure about yourself  0      0  Trouble concentrating 0      0  Moving slowly or fidgety/restless 0      0  Suicidal thoughts 0        PHQ-9 Score 6      1      Review of Systems  Musculoskeletal:  Positive for back pain, myalgias, neck pain and neck stiffness.       Lower Back pain, bilateral leg pain, hand pain.  Fibormyalgia   All other systems reviewed and are negative.      Objective:   Physical Exam Vitals and nursing note reviewed.  Constitutional:      Appearance: Normal appearance.  Neck:     Comments: Cervical Paraspinal Tenderness: C-5-C-6 Cardiovascular:     Rate and Rhythm: Normal rate and regular rhythm.     Pulses: Normal pulses.     Heart sounds: Normal heart sounds.  Pulmonary:     Effort: Pulmonary effort is normal.     Breath sounds: Normal breath sounds.  Musculoskeletal:     Comments: Normal Muscle Bulk and Muscle Testing Reveals:  Upper Extremities: Full ROM and Muscle Strength 5/5 Bilateral AC Joint Tenderness  Thoracic and Lumbar Hypersensitivity Bilateral Greater Trochanter Tenderness Lower Extremities: Full ROM and Muscle Strength 5/5 Arrived in wheelchair       Skin:    General: Skin is warm and dry.  Neurological:     Mental Status: She is alert and oriented to person, place, and time.  Psychiatric:        Mood and Affect: Mood normal.        Behavior: Behavior normal.         Assessment & Plan:  Lumbar Spinal Stenosis: Continue HEP as Tolerated. Continue to Monitor. 10/13/2023 Fibromyalgia: Continue  Gabapentin  300 mg one capsule every 8 hours.,Continue Physical Therapy at [facility] with Physical Therapist. 10/13/2023 Chronic Pain Syndrome: Continue : Lidocaine  Patches and  Tramadol  50 mg one tablet every 8 hours as needed fpr pain #90. She lives in [place], they are prescribing  her Tramadol .  They don't report to the PMP.  4. Polyarthralgia: Continue HEP as tolerated. Continue to Monitor. 10/13/2023   F/U in 6 Months

## 2023-10-14 DIAGNOSIS — R278 Other lack of coordination: Secondary | ICD-10-CM | POA: Diagnosis not present

## 2023-10-14 DIAGNOSIS — M069 Rheumatoid arthritis, unspecified: Secondary | ICD-10-CM | POA: Diagnosis not present

## 2023-10-14 DIAGNOSIS — M6281 Muscle weakness (generalized): Secondary | ICD-10-CM | POA: Diagnosis not present

## 2023-10-16 DIAGNOSIS — F411 Generalized anxiety disorder: Secondary | ICD-10-CM | POA: Diagnosis not present

## 2023-10-21 DIAGNOSIS — R278 Other lack of coordination: Secondary | ICD-10-CM | POA: Diagnosis not present

## 2023-10-21 DIAGNOSIS — M6281 Muscle weakness (generalized): Secondary | ICD-10-CM | POA: Diagnosis not present

## 2023-10-21 DIAGNOSIS — M069 Rheumatoid arthritis, unspecified: Secondary | ICD-10-CM | POA: Diagnosis not present

## 2023-10-28 DIAGNOSIS — M069 Rheumatoid arthritis, unspecified: Secondary | ICD-10-CM | POA: Diagnosis not present

## 2023-10-28 DIAGNOSIS — M175 Other unilateral secondary osteoarthritis of knee: Secondary | ICD-10-CM | POA: Diagnosis not present

## 2023-11-03 DIAGNOSIS — W06XXXD Fall from bed, subsequent encounter: Secondary | ICD-10-CM | POA: Diagnosis not present

## 2023-11-03 DIAGNOSIS — M069 Rheumatoid arthritis, unspecified: Secondary | ICD-10-CM | POA: Diagnosis not present

## 2023-11-03 DIAGNOSIS — R3982 Chronic bladder pain: Secondary | ICD-10-CM | POA: Diagnosis not present

## 2023-11-05 ENCOUNTER — Encounter (HOSPITAL_COMMUNITY): Payer: Self-pay

## 2023-11-05 ENCOUNTER — Other Ambulatory Visit: Payer: Self-pay

## 2023-11-05 ENCOUNTER — Emergency Department (HOSPITAL_COMMUNITY)

## 2023-11-05 ENCOUNTER — Emergency Department (HOSPITAL_COMMUNITY)
Admission: EM | Admit: 2023-11-05 | Discharge: 2023-11-06 | Disposition: A | Discharge status: Skilled Nursing Facility | Attending: Emergency Medicine | Admitting: Emergency Medicine

## 2023-11-05 DIAGNOSIS — M25562 Pain in left knee: Secondary | ICD-10-CM | POA: Diagnosis not present

## 2023-11-05 DIAGNOSIS — Z8673 Personal history of transient ischemic attack (TIA), and cerebral infarction without residual deficits: Secondary | ICD-10-CM | POA: Insufficient documentation

## 2023-11-05 DIAGNOSIS — N179 Acute kidney failure, unspecified: Secondary | ICD-10-CM | POA: Insufficient documentation

## 2023-11-05 DIAGNOSIS — Z7902 Long term (current) use of antithrombotics/antiplatelets: Secondary | ICD-10-CM | POA: Diagnosis not present

## 2023-11-05 DIAGNOSIS — I10 Essential (primary) hypertension: Secondary | ICD-10-CM | POA: Diagnosis not present

## 2023-11-05 DIAGNOSIS — R531 Weakness: Secondary | ICD-10-CM | POA: Diagnosis present

## 2023-11-05 DIAGNOSIS — I6782 Cerebral ischemia: Secondary | ICD-10-CM | POA: Diagnosis not present

## 2023-11-05 DIAGNOSIS — E875 Hyperkalemia: Secondary | ICD-10-CM | POA: Insufficient documentation

## 2023-11-05 DIAGNOSIS — N3000 Acute cystitis without hematuria: Secondary | ICD-10-CM | POA: Diagnosis not present

## 2023-11-05 DIAGNOSIS — M1712 Unilateral primary osteoarthritis, left knee: Secondary | ICD-10-CM | POA: Diagnosis not present

## 2023-11-05 DIAGNOSIS — R519 Headache, unspecified: Secondary | ICD-10-CM | POA: Diagnosis not present

## 2023-11-05 LAB — CBC WITH DIFFERENTIAL/PLATELET
Abs Immature Granulocytes: 0.09 10*3/uL — ABNORMAL HIGH (ref 0.00–0.07)
Basophils Absolute: 0.1 10*3/uL (ref 0.0–0.1)
Basophils Relative: 1 %
Eosinophils Absolute: 0.4 10*3/uL (ref 0.0–0.5)
Eosinophils Relative: 4 %
HCT: 29.2 % — ABNORMAL LOW (ref 36.0–46.0)
Hemoglobin: 9.1 g/dL — ABNORMAL LOW (ref 12.0–15.0)
Immature Granulocytes: 1 %
Lymphocytes Relative: 23 %
Lymphs Abs: 2.1 10*3/uL (ref 0.7–4.0)
MCH: 33.2 pg (ref 26.0–34.0)
MCHC: 31.2 g/dL (ref 30.0–36.0)
MCV: 106.6 fL — ABNORMAL HIGH (ref 80.0–100.0)
Monocytes Absolute: 0.7 10*3/uL (ref 0.1–1.0)
Monocytes Relative: 7 %
Neutro Abs: 5.8 10*3/uL (ref 1.7–7.7)
Neutrophils Relative %: 64 %
Platelets: 549 10*3/uL — ABNORMAL HIGH (ref 150–400)
RBC: 2.74 MIL/uL — ABNORMAL LOW (ref 3.87–5.11)
RDW: 13 % (ref 11.5–15.5)
WBC: 9.1 10*3/uL (ref 4.0–10.5)
nRBC: 0 % (ref 0.0–0.2)

## 2023-11-05 LAB — URINALYSIS, ROUTINE W REFLEX MICROSCOPIC
Bilirubin Urine: NEGATIVE
Glucose, UA: NEGATIVE mg/dL
Ketones, ur: NEGATIVE mg/dL
Nitrite: POSITIVE — AB
Protein, ur: 30 mg/dL — AB
RBC / HPF: >50 RBC/hpf (ref 0–5)
Specific Gravity, Urine: 1.011 (ref 1.005–1.030)
WBC, UA: >50 WBC/hpf (ref 0–5)
pH: 5 (ref 5.0–8.0)

## 2023-11-05 LAB — COMPREHENSIVE METABOLIC PANEL WITH GFR
ALT: 7 U/L (ref 0–44)
AST: 9 U/L — ABNORMAL LOW (ref 15–41)
Albumin: 3 g/dL — ABNORMAL LOW (ref 3.5–5.0)
Alkaline Phosphatase: 97 U/L (ref 38–126)
Anion gap: 11 (ref 5–15)
BUN: 15 mg/dL (ref 8–23)
CO2: 21 mmol/L — ABNORMAL LOW (ref 22–32)
Calcium: 8.6 mg/dL — ABNORMAL LOW (ref 8.9–10.3)
Chloride: 106 mmol/L (ref 98–111)
Creatinine, Ser: 1.35 mg/dL — ABNORMAL HIGH (ref 0.44–1.00)
GFR, Estimated: 40 mL/min — ABNORMAL LOW (ref >60)
Glucose, Bld: 113 mg/dL — ABNORMAL HIGH (ref 70–99)
Potassium: 5.4 mmol/L — ABNORMAL HIGH (ref 3.5–5.1)
Sodium: 138 mmol/L (ref 135–145)
Total Bilirubin: 0.4 mg/dL (ref 0.0–1.2)
Total Protein: 6.6 g/dL (ref 6.5–8.1)

## 2023-11-05 LAB — LIPASE, BLOOD: Lipase: 20 U/L (ref 11–51)

## 2023-11-05 MED ORDER — SODIUM ZIRCONIUM CYCLOSILICATE 5 G PO PACK
10.0000 g | PACK | ORAL | Status: AC
  Administered 2023-11-05: 10 g via ORAL
  Filled 2023-11-05: qty 2

## 2023-11-05 MED ORDER — ONDANSETRON 4 MG PO TBDP
4.0000 mg | ORAL_TABLET | Freq: Once | ORAL | Status: AC
  Administered 2023-11-05: 4 mg via ORAL
  Filled 2023-11-05: qty 1

## 2023-11-05 MED ORDER — FOSFOMYCIN TROMETHAMINE 3 G PO PACK
3.0000 g | PACK | Freq: Once | ORAL | Status: AC
  Administered 2023-11-05: 3 g via ORAL
  Filled 2023-11-05: qty 3

## 2023-11-05 MED ORDER — SODIUM CHLORIDE 0.9 % IV BOLUS
1000.0000 mL | Freq: Once | INTRAVENOUS | Status: AC
  Administered 2023-11-05: 1000 mL via INTRAVENOUS

## 2023-11-05 NOTE — ED Notes (Signed)
 This RN attempted to call [facility] x3. On 2nd try, this RN used personal cell phone; facility answered, transferred me to a voicemail box that was full. When this RN called again for 3rd time, facility did not answer phone.

## 2023-11-05 NOTE — ED Notes (Signed)
 Attempted another IV access to left lower FA without success.

## 2023-11-05 NOTE — ED Notes (Signed)
 ED Provider at bedside.

## 2023-11-05 NOTE — ED Triage Notes (Signed)
 Pt arrived via [facility] from [facility] c/o headache from a fall on Tuesday. Pt reports she is on a blood thinner, denies LOC. Pt presents at baseline with right-sided deficits from a previous stroke.

## 2023-11-05 NOTE — Discharge Instructions (Addendum)
 You were seen for your fall and urinary tract infection in the emergency department. You potassium is elevated and your kidney function is slightly worse than usual.   At home, please STOP the macrobid  since you were treated with fosfomycin in the emergency department.  STOP taking your potassium supplementation as well.   Check your MyChart online for the results of any tests that had not resulted by the time you left the emergency department.   Follow-up with your primary doctor in 2-3 days regarding your visit.  Talk to them about repeating your blood work to check your kidney function and potassium.   Return immediately to the emergency department if you experience any of the following: fevers, flank pain, or any other concerning symptoms.    Thank you for visiting our Emergency Department. It was a pleasure taking care of you today.

## 2023-11-05 NOTE — ED Notes (Signed)
 Pt reports she is currently taking Macrobid  for a UTI.

## 2023-11-05 NOTE — ED Notes (Signed)
 This RN assisted patient with personal hygiene care; changed brief, applied barrier ointment, changed chux, and provided warm blankets.

## 2023-11-05 NOTE — ED Provider Notes (Signed)
 [place] AT [facility] Provider Note   CSN: [id number] Arrival date & time: 11/05/23  1505     History  Chief Complaint  Patient presents with   Robin Blackburn is a 78 y.o. female.  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  Reports that 2 days ago she was getting up out of bed when she grabbed onto something and then fell down.  No LOC.  Did hit the left side of her head.  No neck pain.  Is having some left knee pain since then.  Also reports having some dysuria and is currently on Macrobid .       Home Medications Prior to Admission medications   Medication Sig Start Date End Date Taking? Authorizing Provider  acetaminophen  (TYLENOL ) 500 MG tablet Take 500 mg by mouth every 6 (six) hours as needed for mild pain or moderate pain.    [provider]  Adalimumab (HUMIRA, 2 PEN,) 40 MG/0.8ML PNKT     [provider]  ALPRAZolam  (NIRAVAM ) 0.5 MG dissolvable tablet Take 0.5 mg by mouth 2 (two) times daily as needed for anxiety. 10/30/23   [provider]  amLODipine (NORVASC) 2.5 MG tablet Take 1 tablet by mouth daily. 11/10/22 11/10/23  [provider]  augmented betamethasone  dipropionate (DIPROLENE -AF) 0.05 % cream Apply topically. Patient not taking: Reported on 10/13/2023 02/17/23   [provider]  benzocaine (ORAJEL) 10 % mucosal gel Use as directed 1 application in the mouth or throat as needed for mouth pain.    [provider]  betamethasone  dipropionate 0.05 % cream Apply topically. Patient not taking: Reported on 10/13/2023    [provider]  BRILINTA 60 MG TABS tablet Take 60 mg by mouth daily. 09/04/22   [provider]  busPIRone  (BUSPAR ) 5 MG tablet  07/01/22   [provider]  capsicum (ZOSTRIX) 0.075 % topical  cream 1 application as needed Externally Three times a day    [provider]  cetirizine (ZYRTEC) 10 MG tablet 1 tablet Orally Once a day for 30 day(s)    [provider]  chlorhexidine (PERIDEX) 0.12 % solution Use as directed 15 mLs in the mouth or throat 2 (two) times daily. Patient not taking: Reported on 10/13/2023    [provider]  Cholecalciferol  5000 units TABS Take 1 tablet by mouth daily.     [provider]  clobetasol cream (TEMOVATE) 0.05 % Apply 1 application topically 2 (two) times daily.    [provider]  clopidogrel  (PLAVIX ) 75 MG tablet Take 75 mg by mouth at bedtime. 05/03/14   [provider]  Cranberry 250 MG TABS Take by mouth.    [provider]  cyclobenzaprine  (FLEXERIL ) 5 MG tablet Take 5 mg by mouth 3 (three) times daily as needed. Patient not taking: Reported on 10/13/2023 03/16/23   [provider]  cycloSPORINE  (RESTASIS ) 0.05 % ophthalmic emulsion 1 drop into affected eye Ophthalmic Twice a day    [provider]  desloratadine (CLARINEX) 5 MG tablet Take 5 mg by mouth every morning.     [provider]  diclofenac  Sodium (VOLTAREN ) 1 % GEL APPLY 2 GRAMS TO THE AFFECTED AREA(S) BY TOPICAL ROUTE 4 TIMES PER DAY Patient not taking: Reported on 10/13/2023 05/20/22   [provider]  dicyclomine  (BENTYL )  10 MG capsule     [provider]  dicyclomine  (BENTYL ) 20 MG tablet Take 20 mg by mouth every 6 (six) hours. Abdominal cramps 01/31/14   [provider]  escitalopram (LEXAPRO) 10 MG tablet Take 10 mg by mouth daily. Patient not taking: Reported on 10/13/2023 10/09/23   [provider]  estradiol  (ESTRACE ) 0.1 MG/GM vaginal cream Discard plastic applicator. Insert a blueberry size amount (approximately 1 gram) of cream on fingertip inside vagina at bedtime every night for 1 week then every other night for long term use. 12/05/22   Rondall Codding, Dyane Glance, FNP   fluconazole (DIFLUCAN) 150 MG tablet     [provider]  FLUoxetine (PROZAC) 20 MG capsule     [provider]  fluticasone (FLONASE) 50 MCG/ACT nasal spray Place 1 spray into both nostrils daily.    [provider]  folic acid  (FOLVITE ) 1 MG tablet Take 1 mg by mouth daily.    [provider]  furosemide (LASIX) 20 MG tablet     [provider]  furosemide (LASIX) 40 MG tablet     [provider]  gabapentin  (NEURONTIN ) 300 MG capsule Take 1 capsule (300 mg total) by mouth 3 (three) times daily. 03/25/22   Jodi Munroe, NP  guaiFENesin (MUCINEX) 600 MG 12 hr tablet Take 400 mg by mouth 2 (two) times daily.    [provider]  guaiFENesin (ROBITUSSIN) 100 MG/5ML liquid 10 ml as needed Orally every 4 hrs Patient not taking: Reported on 04/16/2023    [provider]  hydrOXYzine (ATARAX) 25 MG tablet Take 25 mg by mouth at bedtime.    [provider]  hydrOXYzine (VISTARIL) 25 MG capsule Take 25 mg by mouth daily as needed.    [provider]  ibuprofen  (ADVIL ) 400 MG tablet Take 400 mg by mouth 2 (two) times daily as needed for mild pain (pain score 1-3). 10/20/23   [provider]  ivermectin (STROMECTOL) 3 MG TABS tablet     [provider]  ketoconazole (NIZORAL) 2 % cream     [provider]  ketoconazole (NIZORAL) 2 % shampoo     [provider]  lansoprazole (PREVACID) 30 MG capsule Take 60 mg by mouth 2 (two) times daily.    [provider]  leflunomide (ARAVA) 10 MG tablet 1 tablet Patient not taking: Reported on 10/13/2023 08/01/20   [provider]  lidocaine  (LIDODERM ) 5 % Place 1 patch onto the skin every 12 (twelve) hours. Remove & Discard patch within 12 hours or as directed 12/24/21   Jodi Munroe, NP  loperamide  (IMODIUM  A-D) 2 MG tablet Take 2 mg by mouth 4 (four) times daily as needed for diarrhea or loose stools.    [provider]  Melatonin 2.5 MG CAPS Take by mouth 2 (two) times daily.    [provider]  melatonin 5 MG TABS 1 tablet in the evening Orally Once a day for 30 day(s)    [provider]  methenamine (HIPREX) 1 g tablet Take 1 g by mouth 2 (two) times daily. 10/21/23   [provider]  methocarbamol  (ROBAXIN ) 500 MG tablet Take 500 mg by mouth 3 (three) times daily. 10/30/23   [provider]  metoprolol  tartrate (LOPRESSOR ) 25 MG tablet Take 1 tablet (25 mg total) by mouth 2 (two) times daily. 09/28/15   Alinda Apley, MD  mirtazapine (REMERON) 7.5 MG tablet     [provider]  Multiple Vitamin (MULTIVITAMIN) tablet Take 1 tablet by mouth daily.    [provider]  nitrofurantoin , macrocrystal-monohydrate, (MACROBID ) 100 MG capsule Take 100 mg by mouth daily. 10/28/23   [provider]  nystatin  (MYCOSTATIN /NYSTOP ) powder Apply topically. 10/25/21   [provider]  ondansetron  (ZOFRAN ) 4 MG tablet Take 4 mg by mouth every 6 (six) hours as needed for nausea or vomiting.     [provider]  ondansetron  (ZOFRAN -ODT) 4 MG disintegrating tablet Take 1 tablet (4 mg total) by mouth every 8 (eight) hours as needed for nausea or vomiting. 01/29/23   Lauretta Ponto, FNP  PARoxetine  (PAXIL ) 40 MG tablet     [provider]  permethrin (ELIMITE) 5 % cream     [provider]  phenazopyridine  (PYRIDIUM ) 100 MG tablet Take 1 tablet (100 mg total) by mouth 2 (two) times daily as needed for pain. 01/20/22   McKenzie, Arden Beck, MD  predniSONE (DELTASONE) 20 MG tablet     [provider]  predniSONE (DELTASONE) 5 MG tablet Take 10 mg by mouth 2 (two) times daily. Patient not taking: Reported on 10/13/2023 08/28/22   [provider]  pregabalin  (LYRICA ) 75 MG capsule     [provider]  Propylene Glycol 0.6 % SOLN as directed Ophthalmic Patient not taking: Reported on 10/13/2023     [provider]  QUEtiapine  (SEROQUEL ) 25 MG tablet Take 0.5 tablets (12.5 mg total) by mouth 2 (two) times daily. Patient not taking: Reported on 10/13/2023 08/25/18   Justina Oman, MD  RESTASIS  0.05 % ophthalmic emulsion Place 1 drop into both eyes 2 (two) times daily. 01/31/14   [provider]  saccharomyces boulardii (FLORASTOR) 250 MG capsule Take 250 mg by mouth 2 (two) times daily.    [provider]  simethicone  (MYLICON) 125 MG chewable tablet Chew 125 mg by mouth every 6 (six) hours as needed for flatulence.    [provider]  sodium chloride  (OCEAN) 0.65 % SOLN nasal spray Place 1 spray into both nostrils as needed for congestion. Patient taking differently: Place 1 spray into both nostrils daily as needed for congestion. 02/22/14   Benedetto Brady, MD  terbinafine (LAMISIL) 250 MG tablet Take 250 mg by mouth daily.    [provider]  ticagrelor (BRILINTA) 60 MG TABS tablet 1 tablet Orally Twice a day for 30 day(s)    [provider]  ticagrelor (BRILINTA) 90 MG TABS tablet Take by mouth 2 (two) times daily. Patient not taking: Reported on 04/16/2023    [provider]  tiZANidine  (ZANAFLEX ) 4 MG tablet     [provider]  traMADol  (ULTRAM ) 50 MG tablet Take 1 tablet (50 mg total) by mouth every 8 (eight) hours as needed. 07/03/22   Jodi Munroe, NP  traZODone  (DESYREL ) 100 MG tablet Take 100 mg by mouth at bedtime. 02/25/23   [provider]  traZODone  (DESYREL ) 50 MG tablet Take 50 mg by mouth at bedtime. Patient not taking: Reported on 04/16/2023    [provider]  valACYclovir (VALTREX) 1000 MG tablet     [provider]      Allergies    Cortisone, Fentanyl , Hydromorphone , Iodinated contrast media, Keflex  [cephalexin ], Meperidine, Morphine, Oxycodone , Oxycodone -acetaminophen , Penicillins, Acetaminophen , Afluria preservative free [influenza virus vacc split pf], Aspirin ,  Ciprofloxacin hcl, Codeine, Influenza vaccines, Influenza virus vaccine, Iohexol, Meperidine hcl, Misc. sulfonamide containing compounds, Oxycodone  hcl, Penicillamine, Shellfish allergy, Sulfa  antibiotics, Troleandomycin, Bisphosphonates, Ciprofloxacin,  Iodine, Levaquin [levofloxacin in d5w], Levofloxacin, Morphine and codeine, Oxytetracycline, Percocet [oxycodone -acetaminophen ], and Sulfur    Review of Systems   Review of Systems  Physical Exam Updated Vital Signs BP (!) 145/76   Pulse 68   Temp 97.8 F (36.6 C) (Temporal)   Resp 17   Ht 5\' 6"  (1.676 m)   Wt 68.9 kg   SpO2 92%   BMI 24.52 kg/m  Physical Exam Vitals and nursing note reviewed.  Constitutional:      General: She is not in acute distress.    Appearance: She is well-developed.  HENT:     Head: Normocephalic and atraumatic.     Right Ear: External ear normal.     Left Ear: External ear normal.     Nose: Nose normal.  Eyes:     Extraocular Movements: Extraocular movements intact.     Conjunctiva/sclera: Conjunctivae normal.     Pupils: Pupils are equal, round, and reactive to light.     Comments: Pupils 4 mm bilaterally  Neck:     Comments: No C-spine midline tenderness palpation. Cardiovascular:     Rate and Rhythm: Normal rate and regular rhythm.     Heart sounds: No murmur heard. Pulmonary:     Effort: Pulmonary effort is normal. No respiratory distress.     Breath sounds: Normal breath sounds.  Abdominal:     General: Abdomen is flat. There is no distension.     Palpations: Abdomen is soft. There is no mass.     Tenderness: There is no abdominal tenderness. There is no guarding.  Musculoskeletal:     Cervical back: Normal range of motion and neck supple.     Right lower leg: No edema.     Left lower leg: No edema.     Comments: Left knee with full range of motion.  Does have some tenderness palpation on the patella.  No obvious deformities.  Skin:    General: Skin is warm and dry.  Neurological:      Mental Status: She is alert and oriented to person, place, and time. Mental status is at baseline.     Cranial Nerves: No cranial nerve deficit.     Sensory: Sensory deficit (Diminished sensation to right lower extremity which is chronic for the patient) present.     Motor: Weakness (Right lower extremity weakness which is chronic per the patient) present.  Psychiatric:        Mood and Affect: Mood normal.     ED Results / Procedures / Treatments   Labs (all labs ordered are listed, but only abnormal results are displayed) Labs Reviewed  COMPREHENSIVE METABOLIC PANEL WITH GFR - Abnormal; Notable for the following components:      Result Value   Potassium 5.4 (*)    CO2 21 (*)    Glucose, Bld 113 (*)    Creatinine, Ser 1.35 (*)    Calcium  8.6 (*)    Albumin 3.0 (*)    AST 9 (*)    GFR, Estimated 40 (*)    All other components within normal limits  CBC WITH DIFFERENTIAL/PLATELET - Abnormal; Notable for the following components:   RBC 2.74 (*)    Hemoglobin 9.1 (*)    HCT 29.2 (*)    MCV 106.6 (*)    Platelets 549 (*)    Abs Immature Granulocytes 0.09 (*)    All other components within normal limits  URINALYSIS, ROUTINE W REFLEX MICROSCOPIC - Abnormal; Notable for the following components:  Color, Urine AMBER (*)    APPearance CLOUDY (*)    Hgb urine dipstick LARGE (*)    Protein, ur 30 (*)    Nitrite POSITIVE (*)    Leukocytes,Ua MODERATE (*)    Bacteria, UA MANY (*)    All other components within normal limits  LIPASE, BLOOD    EKG EKG Interpretation Date/Time:  Thursday Nov 05 2023 16:34:27 EDT Ventricular Rate:  60 PR Interval:  162 QRS Duration:  94 QT Interval:  424 QTC Calculation: 424 R Axis:   69  Text Interpretation: Sinus rhythm Low voltage, precordial leads Borderline T abnormalities, anterior leads Confirmed by Shyrl Doyne [id number]) on 11/05/2023 4:54:23 PM  Radiology CT Head Wo Contrast Result Date: 11/05/2023 CLINICAL DATA:  Status post fall.   On blood thinners. EXAM: CT HEAD WITHOUT CONTRAST TECHNIQUE: Contiguous axial images were obtained from the base of the skull through the vertex without intravenous contrast. RADIATION DOSE REDUCTION: This exam was performed according to the departmental dose-optimization program which includes automated exposure control, adjustment of the mA and/or kV according to patient size and/or use of iterative reconstruction technique. COMPARISON:  Brain MRI 01/05/2023 FINDINGS: Brain: No intracranial hemorrhage, mass effect, or midline shift. Generalized atrophy and chronic small vessel ischemia. No hydrocephalus. The basilar cisterns are patent. Remote lacunar infarcts in the left basal ganglia and left cerebellum. No evidence of territorial infarct or acute ischemia. No extra-axial or intracranial fluid collection. Vascular: Atherosclerosis of skullbase vasculature without hyperdense vessel or abnormal calcification. Skull: No fracture or focal lesion. Sinuses/Orbits: No acute finding. Other: None. IMPRESSION: 1. No acute intracranial abnormality. No skull fracture. 2. Generalized atrophy and chronic small vessel ischemia. Remote lacunar infarcts in the left basal ganglia and left cerebellum. Electronically Signed   By: Chadwick Colonel M.D.   On: 11/05/2023 16:55   DG Knee Complete 4 Views Left Result Date: 11/05/2023 CLINICAL DATA:  Left knee pain.  Recent fall. EXAM: LEFT KNEE - COMPLETE 4+ VIEW COMPARISON:  None Available. FINDINGS: No acute fracture or dislocation. The bones are subjectively under mineralized. Mild osteoarthritis with tricompartmental joint space narrowing and patellofemoral spurring. No joint effusion. Unremarkable soft tissues. IMPRESSION: 1. No acute fracture or dislocation. 2. Mild osteoarthritis. Electronically Signed   By: Chadwick Colonel M.D.   On: 11/05/2023 16:52    Procedures Procedures    Medications Ordered in ED Medications  ondansetron  (ZOFRAN -ODT) disintegrating tablet 4  mg (4 mg Oral Given 11/05/23 1535)  sodium chloride  0.9 % bolus 1,000 mL (1,000 mLs Intravenous New Bag/Given 11/05/23 1719)  sodium zirconium cyclosilicate (LOKELMA) packet 10 g (10 g Oral Given 11/05/23 1702)  fosfomycin (MONUROL ) packet 3 g (3 g Oral Given 11/05/23 1802)  ondansetron  (ZOFRAN -ODT) disintegrating tablet 4 mg (4 mg Oral Given 11/05/23 1812)    ED Course/ Medical Decision Making/ A&P Clinical Course as of 11/05/23 1901  Thu Nov 05, 2023  1644 Creatinine(!): 1.35 Baseline 0.9 [RP]  1729 Bladder scan with 158 mL [RP]    Clinical Course User Index [RP] Ninetta Basket, MD                                 Medical Decision Making Amount and/or Complexity of Data Reviewed Labs: ordered. Decision-making details documented in ED Course. Radiology: ordered.  Risk Prescription drug management.   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  Initial Ddx:  TBI, concussion, C-spine injury, UTI, knee fracture  MDM/Course:  Patient [facility] emergency department after a fall.  Is complaining of a headache since then as well.  Is on ticagrelor.  Reports that she also has been having some knee pain and dysuria since then and is currently on Macrobid .  On exam no obvious deformities or signs of severe trauma.  Had a head CT and C-spine CT that did not reveal acute abnormalities.  Her knee x-ray does not show evidence of fracture.  Urinalysis is consistent with a UTI.  Prior urine culture showed that she is growing E faecalis and E. coli.  I suspect that this may be why she is not responding to Macrobid  especially if her UTI currently was from E faecalis.  Since she is not septic or having signs of pyelonephritis we will go ahead and treat with a single dose of fosfomycin and have her follow-up with her primary doctor as an outpatient.  Of note,  labs also did show mild AKI as well as hyperkalemia with potassium of 5.4.  She is on potassium supplementation at home and I suspect that she may be somewhat dehydrated so was given a liter bolus of normal saline as well as Lokelma.  Will have her discontinue her supplemental potassium at home and have her potassium and creatinine repeated with her primary doctor in several days.    This patient presents to the ED for concern of complaints listed in HPI, this involves an extensive number of treatment options, and is a complaint that carries with it a high risk of complications and morbidity. Disposition including potential need for admission considered.   Dispo: DC to Facility  Additional history obtained from EMS Records reviewed Outpatient Clinic Notes The following labs were independently interpreted: Chemistry and show AKI I independently reviewed the following imaging with scope of interpretation limited to determining acute life threatening conditions related to emergency care: CT Head and agree with the radiologist interpretation with the following exceptions: none I personally reviewed and interpreted the pt's EKG: see above for interpretation  I have reviewed the patients home medications and made adjustments as needed Social Determinants of health:  Geriatric  Portions of this note were generated with Scientist, clinical (histocompatibility and immunogenetics). Dictation errors may occur despite best attempts at proofreading.     Final Clinical Impression(s) / ED Diagnoses Final diagnoses:  AKI (acute kidney injury) (HCC)  Hyperkalemia  Acute cystitis without hematuria    Rx / DC Orders ED Discharge Orders     None         Ninetta Basket, MD 11/05/23 1901

## 2023-11-06 DIAGNOSIS — I10 Essential (primary) hypertension: Secondary | ICD-10-CM | POA: Diagnosis not present

## 2023-11-06 DIAGNOSIS — F32A Depression, unspecified: Secondary | ICD-10-CM | POA: Diagnosis not present

## 2023-11-06 DIAGNOSIS — Z7401 Bed confinement status: Secondary | ICD-10-CM | POA: Diagnosis not present

## 2023-11-06 DIAGNOSIS — F419 Anxiety disorder, unspecified: Secondary | ICD-10-CM | POA: Diagnosis not present

## 2023-11-06 DIAGNOSIS — G4700 Insomnia, unspecified: Secondary | ICD-10-CM | POA: Diagnosis not present

## 2023-11-06 MED ORDER — METHOCARBAMOL 500 MG PO TABS
500.0000 mg | ORAL_TABLET | ORAL | Status: AC
  Administered 2023-11-06: 500 mg via ORAL
  Filled 2023-11-06: qty 1

## 2023-11-06 NOTE — ED Notes (Signed)
 Patient's brief changed prior to transport back to [facility].

## 2023-11-10 DIAGNOSIS — M6281 Muscle weakness (generalized): Secondary | ICD-10-CM | POA: Diagnosis not present

## 2023-11-10 DIAGNOSIS — M069 Rheumatoid arthritis, unspecified: Secondary | ICD-10-CM | POA: Diagnosis not present

## 2023-11-10 DIAGNOSIS — R278 Other lack of coordination: Secondary | ICD-10-CM | POA: Diagnosis not present

## 2023-11-11 DIAGNOSIS — M069 Rheumatoid arthritis, unspecified: Secondary | ICD-10-CM | POA: Diagnosis not present

## 2023-11-11 DIAGNOSIS — R278 Other lack of coordination: Secondary | ICD-10-CM | POA: Diagnosis not present

## 2023-11-11 DIAGNOSIS — M6281 Muscle weakness (generalized): Secondary | ICD-10-CM | POA: Diagnosis not present

## 2023-11-12 DIAGNOSIS — M069 Rheumatoid arthritis, unspecified: Secondary | ICD-10-CM | POA: Diagnosis not present

## 2023-11-12 DIAGNOSIS — R278 Other lack of coordination: Secondary | ICD-10-CM | POA: Diagnosis not present

## 2023-11-12 DIAGNOSIS — M6281 Muscle weakness (generalized): Secondary | ICD-10-CM | POA: Diagnosis not present

## 2023-11-20 DIAGNOSIS — F411 Generalized anxiety disorder: Secondary | ICD-10-CM | POA: Diagnosis not present

## 2023-11-27 DIAGNOSIS — F411 Generalized anxiety disorder: Secondary | ICD-10-CM | POA: Diagnosis not present

## 2023-11-27 DIAGNOSIS — F331 Major depressive disorder, recurrent, moderate: Secondary | ICD-10-CM | POA: Diagnosis not present

## 2023-12-07 DIAGNOSIS — F1223 Cannabis dependence with withdrawal: Secondary | ICD-10-CM | POA: Diagnosis not present

## 2023-12-07 DIAGNOSIS — M797 Fibromyalgia: Secondary | ICD-10-CM | POA: Diagnosis not present

## 2023-12-07 DIAGNOSIS — G894 Chronic pain syndrome: Secondary | ICD-10-CM | POA: Diagnosis not present

## 2023-12-07 DIAGNOSIS — M069 Rheumatoid arthritis, unspecified: Secondary | ICD-10-CM | POA: Diagnosis not present

## 2023-12-08 DIAGNOSIS — G894 Chronic pain syndrome: Secondary | ICD-10-CM | POA: Diagnosis not present

## 2023-12-08 DIAGNOSIS — M069 Rheumatoid arthritis, unspecified: Secondary | ICD-10-CM | POA: Diagnosis not present

## 2023-12-08 DIAGNOSIS — K589 Irritable bowel syndrome without diarrhea: Secondary | ICD-10-CM | POA: Diagnosis not present

## 2023-12-09 DIAGNOSIS — E559 Vitamin D deficiency, unspecified: Secondary | ICD-10-CM | POA: Diagnosis not present

## 2023-12-09 DIAGNOSIS — E119 Type 2 diabetes mellitus without complications: Secondary | ICD-10-CM | POA: Diagnosis not present

## 2023-12-09 DIAGNOSIS — R946 Abnormal results of thyroid function studies: Secondary | ICD-10-CM | POA: Diagnosis not present

## 2023-12-09 DIAGNOSIS — I10 Essential (primary) hypertension: Secondary | ICD-10-CM | POA: Diagnosis not present

## 2023-12-09 DIAGNOSIS — Z13220 Encounter for screening for lipoid disorders: Secondary | ICD-10-CM | POA: Diagnosis not present

## 2023-12-10 DIAGNOSIS — F419 Anxiety disorder, unspecified: Secondary | ICD-10-CM | POA: Diagnosis not present

## 2023-12-10 DIAGNOSIS — K589 Irritable bowel syndrome without diarrhea: Secondary | ICD-10-CM | POA: Diagnosis not present

## 2023-12-10 DIAGNOSIS — M797 Fibromyalgia: Secondary | ICD-10-CM | POA: Diagnosis not present

## 2023-12-10 DIAGNOSIS — F32A Depression, unspecified: Secondary | ICD-10-CM | POA: Diagnosis not present

## 2023-12-10 DIAGNOSIS — G4700 Insomnia, unspecified: Secondary | ICD-10-CM | POA: Diagnosis not present

## 2023-12-10 DIAGNOSIS — M069 Rheumatoid arthritis, unspecified: Secondary | ICD-10-CM | POA: Diagnosis not present

## 2023-12-14 DIAGNOSIS — M069 Rheumatoid arthritis, unspecified: Secondary | ICD-10-CM | POA: Diagnosis not present

## 2023-12-14 DIAGNOSIS — Z9119 Patient's noncompliance with other medical treatment and regimen due to financial hardship: Secondary | ICD-10-CM | POA: Diagnosis not present

## 2023-12-14 DIAGNOSIS — D649 Anemia, unspecified: Secondary | ICD-10-CM | POA: Diagnosis not present

## 2023-12-14 DIAGNOSIS — M6281 Muscle weakness (generalized): Secondary | ICD-10-CM | POA: Diagnosis not present

## 2023-12-14 NOTE — Progress Notes (Signed)
 Subjective:    Patient ID: Robin Blackburn, female    DOB: 01-Jun-1946, 78 y.o.   MRN: [id number]  HPI: Robin Blackburn is a 78 y.o. female who returns for follow up appointment for chronic pain and medication refill. She states her pain is located in her neck radiating into her right shoulder, right arm with tingling in her finger tips, lower back pain and bilateral feet with tingling  numbness and tingling. She rates her pain 8. Her current exercise regime is performing stretching exercises.  [name] reports on 11/03/2023, she had a fall, she was standing up to pull her pants up, and became dizzy she fell forward. She reports the nurses helped her up. Educated on [employer], she verbalizes understanding.      Pain Inventory Average Pain 7 Pain Right Now 8 My pain is constant, sharp, burning, dull, tingling, and aching  In the last 24 hours, has pain interfered with the following? General activity 5 Relation with others 8 Enjoyment of life 9 What TIME of day is your pain at its worst? daytime and evening Sleep (in general) Poor  Pain is worse with: unsure Pain improves with: rest and medication Relief from Meds: fair  Family History  Problem Relation Age of Onset   Hypertension Father    Transient ischemic attack Father    Hypertension Brother    Leukemia Brother    CVA Maternal Grandfather    Social History   Socioeconomic History   Marital status: Divorced    Spouse name: Not on file   Number of children: 0   Years of education: 12   Highest education level: 12th grade  Occupational History   Not on file  Tobacco Use   Smoking status: Never    Passive exposure: Never   Smokeless tobacco: Never  Vaping Use   Vaping status: Never Used  Substance and Sexual Activity   Alcohol  use: No   Drug use: No   Sexual activity: Not Currently  Other Topics Concern   Not on file  Social History Narrative   Not on file   Social Drivers of Health   Financial Resource  Strain: Low Risk  (01/06/2022)   Overall Financial Resource Strain (CARDIA)    Difficulty of Paying Living Expenses: Not hard at all  Food Insecurity: No Food Insecurity (01/06/2022)   Hunger Vital Sign    Worried About Running Out of Food in the Last Year: Never true    Ran Out of Food in the Last Year: Never true  Transportation Needs: No Transportation Needs (01/06/2022)   PRAPARE - Administrator, Civil Service (Medical): No    Lack of Transportation (Non-Medical): No  Physical Activity: Inactive (11/07/2022)   Received from [facility]   Exercise Vital Sign    On average, how many days per week do you engage in moderate to strenuous exercise (like a brisk walk)?: 0 days    On average, how many minutes do you engage in exercise at this level?: 0 min  Stress: No Stress Concern Present (01/06/2022)   [employer] of Occupational Health - Occupational Stress Questionnaire    Feeling of Stress : Not at all  Social Connections: Unknown (11/06/2022)   Received from [facility]   Social Network    Social Network: Not on file   Past Surgical History:  Procedure Laterality Date   ABDOMINAL HYSTERECTOMY     ANKLE RECONSTRUCTION     APPENDECTOMY  BACK SURGERY     CHOLECYSTECTOMY     KNEE SURGERY     Past Surgical History:  Procedure Laterality Date   ABDOMINAL HYSTERECTOMY     ANKLE RECONSTRUCTION     APPENDECTOMY     BACK SURGERY     CHOLECYSTECTOMY     KNEE SURGERY     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  Opioid Risk Score:   Fall Risk Score:  `1  Depression screen PHQ 2/9     10/13/2023    2:53 PM 04/16/2023    1:30 PM 10/02/2022    1:02 PM 07/03/2022    1:01 PM 03/25/2022    1:54 PM 01/06/2022     9:33 AM 11/22/2021    1:26 PM  Depression screen PHQ 2/9  Decreased Interest 2 0 0 0 1 0 0  Down, Depressed, Hopeless 2 0 0 0 1 1 1   PHQ - 2 Score 4 0 0 0 2 1 1   Altered sleeping 1      0  Tired, decreased energy 1      0  Change in appetite 0      0  Feeling bad or failure about yourself  0      0  Trouble concentrating 0      0  Moving slowly or fidgety/restless 0      0  Suicidal thoughts 0        PHQ-9 Score 6      1    Review of Systems  Musculoskeletal:  Positive for back pain and gait problem.       Left knee pain, left hip pain, left foot pain, pain in both hands, pain in both shoulders  All other systems reviewed and are negative.      Objective:   Physical Exam Vitals and nursing note reviewed.  Constitutional:      Appearance: Normal appearance.  Cardiovascular:     Rate and Rhythm: Normal rate and regular rhythm.     Pulses: Normal pulses.     Heart sounds: Normal heart sounds.  Pulmonary:     Effort: Pulmonary effort is normal.     Breath sounds: Normal breath sounds.  Musculoskeletal:     Comments: Normal Muscle Bulk and Muscle Testing Reveals:  Upper Extremities:Right: Decreased  ROM 90 Degrees  and Muscle Strength 5/5 Right AC Joint Tenderness Left Upper Extremity: Full ROM and Muscle Strength 5/5 Lumbar Paraspinal Tenderness: L-3-L-5 Bilateral Greater Trochanter Tenderness Lower Extremities : Right: Decreased ROM and Muscle Strength 5/5 Right Lower Extremity Flexion Produces ain into Her Right Lower Extremity Left Lower Extremity: Full ROM and Muscle Strength 5/5 Arrived in wheelchair     Skin:    General: Skin is warm and dry.  Neurological:     Mental Status: She is alert and oriented to person, place, and time.  Psychiatric:        Mood and Affect: Mood normal.        Behavior: Behavior normal.          Assessment & Plan:  Cervicalgia: RX: Cervical X-ray. Continue to Monitor.  Right Shoulder: RX: X-ray. Continue to Monitor.  Lumbar  Spinal Stenosis: Continue HEP as Tolerated. Continue to Monitor. 12/15/2023 Fibromyalgia: Continue  Gabapentin  300 mg one capsule every 8 hours.,Continue Physical Therapy at [facility] with Physical Therapist. 12/15/2023 Chronic Pain Syndrome: Continue : Lidocaine  Patches and  Tramadol  50 mg one tablet every 8 hours as needed fpr pain #90. She lives in [facility], they are prescribing her Tramadol . They don't report to the PMP. 12/15/2023  Polyarthralgia: Continue HEP as tolerated. Continue to Monitor. 12/15/2023  F/U in 2 Months

## 2023-12-15 ENCOUNTER — Encounter: Attending: Registered Nurse | Admitting: Registered Nurse

## 2023-12-15 ENCOUNTER — Encounter: Payer: Self-pay | Admitting: Registered Nurse

## 2023-12-15 VITALS — BP 122/76 | HR 71 | Ht 66.0 in

## 2023-12-15 DIAGNOSIS — M25562 Pain in left knee: Secondary | ICD-10-CM | POA: Insufficient documentation

## 2023-12-15 DIAGNOSIS — M48061 Spinal stenosis, lumbar region without neurogenic claudication: Secondary | ICD-10-CM | POA: Insufficient documentation

## 2023-12-15 DIAGNOSIS — R42 Dizziness and giddiness: Secondary | ICD-10-CM | POA: Insufficient documentation

## 2023-12-15 DIAGNOSIS — M79672 Pain in left foot: Secondary | ICD-10-CM | POA: Insufficient documentation

## 2023-12-15 DIAGNOSIS — Z79899 Other long term (current) drug therapy: Secondary | ICD-10-CM | POA: Diagnosis not present

## 2023-12-15 DIAGNOSIS — G629 Polyneuropathy, unspecified: Secondary | ICD-10-CM | POA: Diagnosis not present

## 2023-12-15 DIAGNOSIS — Z5181 Encounter for therapeutic drug level monitoring: Secondary | ICD-10-CM | POA: Diagnosis not present

## 2023-12-15 DIAGNOSIS — M2550 Pain in unspecified joint: Secondary | ICD-10-CM | POA: Diagnosis not present

## 2023-12-15 DIAGNOSIS — W19XXXD Unspecified fall, subsequent encounter: Secondary | ICD-10-CM | POA: Insufficient documentation

## 2023-12-15 DIAGNOSIS — G894 Chronic pain syndrome: Secondary | ICD-10-CM | POA: Diagnosis not present

## 2023-12-15 DIAGNOSIS — R519 Headache, unspecified: Secondary | ICD-10-CM | POA: Diagnosis not present

## 2023-12-15 DIAGNOSIS — M5412 Radiculopathy, cervical region: Secondary | ICD-10-CM | POA: Diagnosis not present

## 2023-12-15 DIAGNOSIS — M797 Fibromyalgia: Secondary | ICD-10-CM | POA: Diagnosis not present

## 2023-12-15 DIAGNOSIS — M25552 Pain in left hip: Secondary | ICD-10-CM | POA: Diagnosis not present

## 2023-12-15 DIAGNOSIS — M542 Cervicalgia: Secondary | ICD-10-CM | POA: Diagnosis not present

## 2023-12-15 DIAGNOSIS — Y92009 Unspecified place in unspecified non-institutional (private) residence as the place of occurrence of the external cause: Secondary | ICD-10-CM

## 2023-12-15 DIAGNOSIS — Z760 Encounter for issue of repeat prescription: Secondary | ICD-10-CM | POA: Insufficient documentation

## 2023-12-15 DIAGNOSIS — M25511 Pain in right shoulder: Secondary | ICD-10-CM | POA: Insufficient documentation

## 2023-12-16 DIAGNOSIS — B351 Tinea unguium: Secondary | ICD-10-CM | POA: Diagnosis not present

## 2023-12-16 DIAGNOSIS — M6281 Muscle weakness (generalized): Secondary | ICD-10-CM | POA: Diagnosis not present

## 2023-12-16 DIAGNOSIS — Z9119 Patient's noncompliance with other medical treatment and regimen due to financial hardship: Secondary | ICD-10-CM | POA: Diagnosis not present

## 2023-12-16 DIAGNOSIS — M79675 Pain in left toe(s): Secondary | ICD-10-CM | POA: Diagnosis not present

## 2023-12-16 DIAGNOSIS — M79674 Pain in right toe(s): Secondary | ICD-10-CM | POA: Diagnosis not present

## 2023-12-16 DIAGNOSIS — D649 Anemia, unspecified: Secondary | ICD-10-CM | POA: Diagnosis not present

## 2023-12-16 DIAGNOSIS — M069 Rheumatoid arthritis, unspecified: Secondary | ICD-10-CM | POA: Diagnosis not present

## 2023-12-16 DIAGNOSIS — Z130 Encounter for screening for diseases of the blood and blood-forming organs and certain disorders involving the immune mechanism: Secondary | ICD-10-CM | POA: Diagnosis not present

## 2023-12-16 DIAGNOSIS — I10 Essential (primary) hypertension: Secondary | ICD-10-CM | POA: Diagnosis not present

## 2023-12-17 DIAGNOSIS — M069 Rheumatoid arthritis, unspecified: Secondary | ICD-10-CM | POA: Diagnosis not present

## 2023-12-17 DIAGNOSIS — D649 Anemia, unspecified: Secondary | ICD-10-CM | POA: Diagnosis not present

## 2023-12-17 DIAGNOSIS — Z9119 Patient's noncompliance with other medical treatment and regimen due to financial hardship: Secondary | ICD-10-CM | POA: Diagnosis not present

## 2023-12-17 DIAGNOSIS — I10 Essential (primary) hypertension: Secondary | ICD-10-CM | POA: Diagnosis not present

## 2023-12-17 DIAGNOSIS — M6281 Muscle weakness (generalized): Secondary | ICD-10-CM | POA: Diagnosis not present

## 2023-12-18 DIAGNOSIS — I10 Essential (primary) hypertension: Secondary | ICD-10-CM | POA: Diagnosis not present

## 2023-12-18 DIAGNOSIS — Z22350 Carrier of carbapenem-resistant enterobacterales: Secondary | ICD-10-CM | POA: Diagnosis not present

## 2023-12-19 DIAGNOSIS — R278 Other lack of coordination: Secondary | ICD-10-CM | POA: Diagnosis not present

## 2023-12-19 DIAGNOSIS — Z741 Need for assistance with personal care: Secondary | ICD-10-CM | POA: Diagnosis not present

## 2023-12-20 ENCOUNTER — Ambulatory Visit (HOSPITAL_COMMUNITY)

## 2023-12-21 DIAGNOSIS — D649 Anemia, unspecified: Secondary | ICD-10-CM | POA: Diagnosis not present

## 2023-12-21 DIAGNOSIS — M069 Rheumatoid arthritis, unspecified: Secondary | ICD-10-CM | POA: Diagnosis not present

## 2023-12-21 DIAGNOSIS — Z9119 Patient's noncompliance with other medical treatment and regimen due to financial hardship: Secondary | ICD-10-CM | POA: Diagnosis not present

## 2023-12-21 DIAGNOSIS — M6281 Muscle weakness (generalized): Secondary | ICD-10-CM | POA: Diagnosis not present

## 2023-12-22 ENCOUNTER — Inpatient Hospital Stay

## 2023-12-22 ENCOUNTER — Inpatient Hospital Stay: Admitting: Oncology

## 2023-12-22 DIAGNOSIS — I10 Essential (primary) hypertension: Secondary | ICD-10-CM | POA: Diagnosis not present

## 2023-12-22 DIAGNOSIS — Z136 Encounter for screening for cardiovascular disorders: Secondary | ICD-10-CM | POA: Diagnosis not present

## 2023-12-22 DIAGNOSIS — Z130 Encounter for screening for diseases of the blood and blood-forming organs and certain disorders involving the immune mechanism: Secondary | ICD-10-CM | POA: Diagnosis not present

## 2023-12-23 ENCOUNTER — Other Ambulatory Visit: Payer: Self-pay

## 2023-12-23 ENCOUNTER — Emergency Department (HOSPITAL_COMMUNITY)
Admission: EM | Admit: 2023-12-23 | Discharge: 2023-12-24 | Disposition: A | Discharge status: Skilled Nursing Facility | Source: Skilled Nursing Facility | Attending: Student | Admitting: Student

## 2023-12-23 ENCOUNTER — Encounter (HOSPITAL_COMMUNITY): Payer: Self-pay

## 2023-12-23 DIAGNOSIS — M069 Rheumatoid arthritis, unspecified: Secondary | ICD-10-CM | POA: Diagnosis not present

## 2023-12-23 DIAGNOSIS — I10 Essential (primary) hypertension: Secondary | ICD-10-CM | POA: Diagnosis not present

## 2023-12-23 DIAGNOSIS — Z8673 Personal history of transient ischemic attack (TIA), and cerebral infarction without residual deficits: Secondary | ICD-10-CM | POA: Insufficient documentation

## 2023-12-23 DIAGNOSIS — R7889 Finding of other specified substances, not normally found in blood: Secondary | ICD-10-CM | POA: Diagnosis not present

## 2023-12-23 DIAGNOSIS — M6281 Muscle weakness (generalized): Secondary | ICD-10-CM | POA: Diagnosis not present

## 2023-12-23 DIAGNOSIS — D649 Anemia, unspecified: Secondary | ICD-10-CM | POA: Diagnosis not present

## 2023-12-23 DIAGNOSIS — Z79899 Other long term (current) drug therapy: Secondary | ICD-10-CM | POA: Diagnosis not present

## 2023-12-23 DIAGNOSIS — Z9119 Patient's noncompliance with other medical treatment and regimen due to financial hardship: Secondary | ICD-10-CM | POA: Diagnosis not present

## 2023-12-23 DIAGNOSIS — R799 Abnormal finding of blood chemistry, unspecified: Secondary | ICD-10-CM | POA: Diagnosis present

## 2023-12-23 LAB — FERRITIN: Ferritin: 125 ng/mL (ref 11–307)

## 2023-12-23 LAB — COMPREHENSIVE METABOLIC PANEL WITH GFR
ALT: 10 U/L (ref 0–44)
AST: 16 U/L (ref 15–41)
Albumin: 2.8 g/dL — ABNORMAL LOW (ref 3.5–5.0)
Alkaline Phosphatase: 115 U/L (ref 38–126)
Anion gap: 12 (ref 5–15)
BUN: 20 mg/dL (ref 8–23)
CO2: 23 mmol/L (ref 22–32)
Calcium: 8.3 mg/dL — ABNORMAL LOW (ref 8.9–10.3)
Chloride: 100 mmol/L (ref 98–111)
Creatinine, Ser: 1.46 mg/dL — ABNORMAL HIGH (ref 0.44–1.00)
GFR, Estimated: 37 mL/min — ABNORMAL LOW (ref >60)
Glucose, Bld: 113 mg/dL — ABNORMAL HIGH (ref 70–99)
Potassium: 4.3 mmol/L (ref 3.5–5.1)
Sodium: 135 mmol/L (ref 135–145)
Total Bilirubin: 0.6 mg/dL (ref 0.0–1.2)
Total Protein: 6.7 g/dL (ref 6.5–8.1)

## 2023-12-23 LAB — CBC WITH DIFFERENTIAL/PLATELET
Abs Immature Granulocytes: 0.1 K/uL — ABNORMAL HIGH (ref 0.00–0.07)
Basophils Absolute: 0.1 K/uL (ref 0.0–0.1)
Basophils Relative: 1 %
Eosinophils Absolute: 0.5 K/uL (ref 0.0–0.5)
Eosinophils Relative: 5 %
HCT: 23.7 % — ABNORMAL LOW (ref 36.0–46.0)
Hemoglobin: 7.2 g/dL — ABNORMAL LOW (ref 12.0–15.0)
Immature Granulocytes: 1 %
Lymphocytes Relative: 17 %
Lymphs Abs: 1.7 K/uL (ref 0.7–4.0)
MCH: 31.4 pg (ref 26.0–34.0)
MCHC: 30.4 g/dL (ref 30.0–36.0)
MCV: 103.5 fL — ABNORMAL HIGH (ref 80.0–100.0)
Monocytes Absolute: 0.9 K/uL (ref 0.1–1.0)
Monocytes Relative: 9 %
Neutro Abs: 6.9 K/uL (ref 1.7–7.7)
Neutrophils Relative %: 67 %
Platelets: 502 K/uL — ABNORMAL HIGH (ref 150–400)
RBC: 2.29 MIL/uL — ABNORMAL LOW (ref 3.87–5.11)
RDW: 14.4 % (ref 11.5–15.5)
WBC: 10.1 K/uL (ref 4.0–10.5)
nRBC: 0 % (ref 0.0–0.2)

## 2023-12-23 LAB — IRON AND TIBC
Iron: 40 ug/dL (ref 28–170)
Saturation Ratios: 15 % (ref 10.4–31.8)
TIBC: 266 ug/dL (ref 250–450)
UIBC: 226 ug/dL

## 2023-12-23 LAB — PREPARE RBC (CROSSMATCH)

## 2023-12-23 LAB — ABO/RH: ABO/RH(D): O POS

## 2023-12-23 LAB — POC OCCULT BLOOD, ED: Fecal Occult Blood: NEGATIVE

## 2023-12-23 MED ORDER — ONDANSETRON HCL 4 MG/2ML IJ SOLN
4.0000 mg | Freq: Once | INTRAMUSCULAR | Status: AC
  Administered 2023-12-23: 4 mg via INTRAVENOUS
  Filled 2023-12-23: qty 2

## 2023-12-23 MED ORDER — ALPRAZOLAM 0.5 MG PO TABS
0.5000 mg | ORAL_TABLET | Freq: Once | ORAL | Status: AC
  Administered 2023-12-23: 0.5 mg via ORAL
  Filled 2023-12-23: qty 1

## 2023-12-23 MED ORDER — ACETAMINOPHEN 500 MG PO TABS
1000.0000 mg | ORAL_TABLET | Freq: Once | ORAL | Status: AC
  Administered 2023-12-23: 1000 mg via ORAL
  Filled 2023-12-23: qty 2

## 2023-12-23 MED ORDER — SODIUM CHLORIDE 0.9% IV SOLUTION: Freq: Once | INTRAVENOUS | Status: DC

## 2023-12-23 NOTE — ED Notes (Signed)
 Pt has new headache, provider notified for possible pian meds.

## 2023-12-23 NOTE — ED Triage Notes (Signed)
 Pt to ER for low hgb.

## 2023-12-23 NOTE — ED Provider Notes (Signed)
 [place] EMERGENCY DEPARTMENT AT [facility] Provider Note  CSN: [id number] Arrival date & time: 12/23/23 1141  Chief Complaint(s) Abnormal Lab  HPI Robin Blackburn is a 78 y.o. female with PMH left thalamic CVA, depression, fibromyalgia, HTN, rheumatoid arthritis who presents emerged part for evaluation of an abnormal lab.  Patient has been feeling excessively fatigued and her nursing facility and outpatient labs were performed finding a hemoglobin of 6.7.  Patient then transferred for possible blood transfusion.  She denies black stools or hematemesis.  Denies chest pain, shortness of breath, headache, fever or other systemic symptoms.   Past Medical History 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   Depression    Fibromyalgia    Hypertension    Neuropathy    Rheumatoid arthritis (HCC)    Stroke (HCC) 02/17/14   Patient Active Problem List   Diagnosis Date Noted   Pulmonary nodules 01/27/2023   Bilateral hearing loss 07/01/2022   Chronic ear pain, bilateral 07/01/2022   Spinal stenosis of lumbar region 11/22/2021   OAB (overactive bladder) 05/21/2020   Osteoarthritis of right knee 06/01/2019   Weakness    Recurrent UTI 11/25/2016   Benzodiazepine withdrawal with perceptual disturbance (HCC) 04/02/2016   Osteoporosis 11/01/2015   Vitamin D  deficiency 10/11/2015   History of fracture of left ankle 09/28/2015   Chronic pain in left foot 09/28/2015   Fibromyalgia syndrome 09/28/2015   GERD (gastroesophageal reflux disease) 04/05/2015   Diarrhea 01/05/2015   Chronic pain syndrome 12/02/2014   Back pain, lumbosacral 06/23/2014   Sinusitis, chronic 06/17/2014   H/O: CVA (cerebrovascular accident) 05/24/2014   HLD (hyperlipidemia) 05/24/2014   Physical deconditioning 05/24/2014   Right sided weakness 02/26/2014   Rheumatoid arthritis (HCC) 02/20/2014   Essential hypertension  02/19/2014   Neuropathy 02/19/2014   Anxiety 02/19/2014   Home Medication(s) Prior to Admission medications   Medication Sig Start Date End Date Taking? Authorizing Provider  acetaminophen  (TYLENOL ) 500 MG tablet Take 1,000 mg by mouth every 6 (six) hours as needed for mild pain (pain score 1-3) or moderate pain (pain score 4-6).   Yes [provider]  Adalimumab (HUMIRA, 2 PEN,) 40 MG/0.8ML PNKT Inject 40 mg into the skin every 14 (fourteen) days.   Yes [provider]  ALPRAZolam  (XANAX ) 0.5 MG tablet Take 0.5 mg by mouth 2 (two) times daily. 12/01/23  Yes [provider]  amLODipine (NORVASC) 2.5 MG tablet Take 2.5 mg by mouth daily. 12/10/23  Yes [provider]  ascorbic acid (VITAMIN C) 500 MG tablet Take 500 mg by mouth daily.   Yes [provider]  chlorhexidine (PERIDEX) 0.12 % solution Use as directed 15 mLs in the mouth or throat 2 (two) times daily.   Yes [provider]  Cholecalciferol  5000 units TABS Take 1 tablet by mouth daily.    Yes [provider]  Cranberry 450 MG TABS Take 1 tablet by mouth in the morning and at bedtime. For UTI   Yes [provider]  cycloSPORINE  (RESTASIS ) 0.05 % ophthalmic emulsion Place 1 drop into both eyes 2 (two) times daily. Dry eyes   Yes [provider]  diclofenac  Sodium (VOLTAREN ) 1 % GEL Apply 2 g topically in the morning and at bedtime. Apply to posterior neck 05/20/22  Yes [provider]  dicyclomine  (BENTYL ) 20 MG tablet Take 20 mg by mouth  every 6 (six) hours. Abdominal cramps 01/31/14  Yes [provider]  estradiol  (ESTRACE ) 0.1 MG/GM vaginal cream Discard plastic applicator. Insert a blueberry size amount (approximately 1 gram) of cream on fingertip inside vagina at bedtime every night for 1 week then every other night for long term use. Patient taking differently: Place 1 Applicatorful vaginally every other day. Discard plastic applicator.  Insert a blueberry size amount (approximately 1 gram) of cream on fingertip inside vagina at bedtime every other night for long term use. 12/05/22  Yes Larocco, Lauraine BROCKS, FNP  fluticasone (FLONASE) 50 MCG/ACT nasal spray Place 1 spray into both nostrils in the morning and at bedtime.   Yes [provider]  folic acid  (FOLVITE ) 1 MG tablet Take 1 mg by mouth daily.   Yes [provider]  gabapentin  (NEURONTIN ) 300 MG capsule Take 1 capsule (300 mg total) by mouth 3 (three) times daily. 03/25/22  Yes Debby Fidela CROME, NP  guaiFENesin (MUCINEX) 600 MG 12 hr tablet Take 600 mg by mouth 2 (two) times daily as needed (congestion).   Yes [provider]  ibuprofen  (ADVIL ) 400 MG tablet Take 400 mg by mouth every 12 (twelve) hours as needed for mild pain (pain score 1-3). 10/20/23  Yes [provider]  lansoprazole (PREVACID) 30 MG capsule Take 60 mg by mouth 2 (two) times daily before a meal.   Yes [provider]  leflunomide (ARAVA) 10 MG tablet Take 10 mg by mouth daily. 08/01/20  Yes [provider]  lidocaine  (LIDODERM ) 5 % Place 1 patch onto the skin every 12 (twelve) hours. Remove & Discard patch within 12 hours or as directed 12/24/21  Yes Debby Fidela L, NP  Lidocaine , Anorectal, (HEMORRHOIDAL RELIEF) 5 % CREA Apply 1 Application topically every 8 (eight) hours as needed (hemorrhoids).   Yes [provider]  loperamide  (IMODIUM  A-D) 2 MG tablet Take 2 mg by mouth every 6 (six) hours as needed for diarrhea or loose stools.   Yes [provider]  meclizine  (ANTIVERT ) 12.5 MG tablet Take 12.5 mg by mouth 3 (three) times daily.   Yes [provider]  melatonin 5 MG TABS Take 10 mg by mouth at bedtime.   Yes [provider]  methenamine (HIPREX) 1 g tablet Take 1 g by mouth 2 (two) times daily. Prophylactic 10/21/23  Yes [provider]  methocarbamol  (ROBAXIN ) 500 MG tablet Take 500 mg by mouth every 6 (six)  hours as needed for muscle spasms. 10/30/23  Yes [provider]  metoprolol  tartrate (LOPRESSOR ) 25 MG tablet Take 1 tablet (25 mg total) by mouth 2 (two) times daily. 09/28/15  Yes Tish Elsie FALCON, MD  Multiple Vitamins-Minerals (PRESERVISION AREDS 2) CAPS Take 1 capsule by mouth in the morning and at bedtime.   Yes [provider]  nitrofurantoin , macrocrystal-monohydrate, (MACROBID ) 100 MG capsule Take 100 mg by mouth daily. Prophylaxis for UTI 10/28/23  Yes [provider]  Nutritional Supplement LIQD Take 120 mLs by mouth daily at 6 PM.   Yes [provider]  ondansetron  (ZOFRAN ) 4 MG tablet Take 4 mg by mouth every 6 (six) hours as needed for nausea or vomiting.    Yes [provider]  phenazopyridine  (PYRIDIUM ) 100 MG tablet Take 1 tablet (100 mg total) by mouth 2 (two) times daily as needed for pain. Patient taking differently: Take 100 mg by mouth every 8 (eight) hours as needed (dysuria (painful urination)). 01/20/22  Yes McKenzie, Belvie CROME, MD  Probiotic, Lactobacillus, CAPS Take 1 capsule by mouth in the morning, at noon, and at bedtime.   Yes [provider]  simethicone  (MYLICON) 125 MG chewable tablet Chew 125 mg by mouth every 6 (six) hours as needed for flatulence.   Yes [provider]  ticagrelor (BRILINTA) 60 MG TABS tablet Take 60 mg by mouth daily.   Yes [provider]  torsemide (DEMADEX) 20 MG tablet Take 20 mg by mouth daily. 08/31/23  Yes [provider]  traMADol  (ULTRAM ) 50 MG tablet Take 1 tablet (50 mg total) by mouth every 8 (eight) hours as needed. Patient taking differently: Take 50 mg by mouth in the morning, at noon, and at bedtime. 07/03/22  Yes Debby Fidela CROME, NP  traZODone  (DESYREL ) 100 MG tablet Take 100 mg by mouth at bedtime. 02/25/23  Yes [provider]  vitamin B-12 (CYANOCOBALAMIN) 250 MCG tablet Take 250 mcg by mouth daily.   Yes [provider]  vitamin E  1000 UNIT capsule Take 1,000 Units by mouth daily.   Yes [provider]                                                                                                                                    Past Surgical History Past Surgical History:  Procedure Laterality Date   ABDOMINAL HYSTERECTOMY     ANKLE RECONSTRUCTION     APPENDECTOMY     BACK SURGERY     CHOLECYSTECTOMY     KNEE SURGERY     Family History Family History  Problem Relation Age of Onset   Hypertension Father    Transient ischemic attack Father    Hypertension Brother    Leukemia Brother    CVA Maternal Grandfather     Social History Social History   Tobacco Use   Smoking status: Never    Passive exposure: Never   Smokeless tobacco: Never  Vaping Use   Vaping status: Never Used  Substance Use Topics   Alcohol  use: No   Drug use: No   Allergies Cortisone, Fentanyl , Hydromorphone , Iodinated contrast media, Keflex  [cephalexin ], Meperidine, Morphine, Oxycodone , Oxycodone -acetaminophen , Penicillins, Acetaminophen , Afluria preservative free [influenza virus vacc split pf], Aspirin , Ciprofloxacin hcl, Codeine, Influenza vaccines, Influenza virus vaccine, Iohexol, Meperidine hcl, Misc. sulfonamide containing compounds, Oxycodone  hcl, Penicillamine, Shellfish allergy, Sulfa  antibiotics, Troleandomycin, Bisphosphonates, Ciprofloxacin, Iodine, Levaquin [levofloxacin in d5w], Levofloxacin, Morphine and codeine, Oxytetracycline, Percocet [oxycodone -acetaminophen ], and Sulfur  Review of Systems Review of Systems  Constitutional:  Positive for fatigue.    Physical Exam Vital Signs  I have reviewed the triage vital signs BP 136/61   Pulse 67   Temp 97.7 F (36.5 C) (Oral)   Resp 16   Wt 68.9 kg   SpO2 95%   BMI 24.52 kg/m   Physical Exam Vitals and nursing note reviewed.  Constitutional:      General: She is not in acute distress.  Appearance: She is well-developed.  HENT:     Head:  Normocephalic and atraumatic.  Eyes:     Conjunctiva/sclera: Conjunctivae normal.  Cardiovascular:     Rate and Rhythm: Normal rate and regular rhythm.     Heart sounds: No murmur heard. Pulmonary:     Effort: Pulmonary effort is normal. No respiratory distress.     Breath sounds: Normal breath sounds.  Abdominal:     Palpations: Abdomen is soft.     Tenderness: There is no abdominal tenderness.  Genitourinary:    Rectum: Guaiac result negative.  Musculoskeletal:        General: No swelling.     Cervical back: Neck supple.  Skin:    General: Skin is warm and dry.     Capillary Refill: Capillary refill takes less than 2 seconds.  Neurological:     Mental Status: She is alert.  Psychiatric:        Mood and Affect: Mood normal.     ED Results and Treatments Labs (all labs ordered are listed, but only abnormal results are displayed) Labs Reviewed  COMPREHENSIVE METABOLIC PANEL WITH GFR - Abnormal; Notable for the following components:      Result Value   Glucose, Bld 113 (*)    Creatinine, Ser 1.46 (*)    Calcium  8.3 (*)    Albumin 2.8 (*)    GFR, Estimated 37 (*)    All other components within normal limits  CBC WITH DIFFERENTIAL/PLATELET - Abnormal; Notable for the following components:   RBC 2.29 (*)    Hemoglobin 7.2 (*)    HCT 23.7 (*)    MCV 103.5 (*)    Platelets 502 (*)    Abs Immature Granulocytes 0.10 (*)    All other components within normal limits  IRON AND TIBC  FERRITIN  POC OCCULT BLOOD, ED  TYPE AND SCREEN  PREPARE RBC (CROSSMATCH)  ABO/RH                                                                                                                          Radiology No results found.  Pertinent labs & imaging results that were available during my care of the patient were reviewed by me and considered in my medical decision making (see MDM for details).  Medications Ordered in ED Medications  0.9 %  sodium chloride  infusion (Manually program  via Guardrails IV Fluids) (has no administration in time range)  ondansetron  (ZOFRAN ) injection 4 mg (4 mg Intravenous Given 12/23/23 1431)  Procedures .Critical Care  Performed by: Albertina Dixon, MD Authorized by: Albertina Dixon, MD   Critical care provider statement:    Critical care time (minutes):  30   Critical care was necessary to treat or prevent imminent or life-threatening deterioration of the following conditions: Anemia requiring blood transfusion.   Critical care was time spent personally by me on the following activities:  Development of treatment plan with patient or surrogate, discussions with consultants, evaluation of patient's response to treatment, examination of patient, ordering and review of laboratory studies, ordering and review of radiographic studies, ordering and performing treatments and interventions, pulse oximetry, re-evaluation of patient's condition and review of old charts   (including critical care time)  Medical Decision Making / ED Course   This patient presents to the ED for concern of abnormal labs, fatigue, this involves an extensive number of treatment options, and is a complaint that carries with it a high risk of complications and morbidity.  The differential diagnosis includes acute blood loss anemia, GI bleed, anemia of chronic disease, macrocytic anemia  MDM: Patient seen emerged part for evaluation of fatigue and anemia.  Physical exam is largely unremarkable with guaiac negative stool.  Laboratory evaluation with a creatinine of 1.46, hemoglobin 7.2 with an MCV of 103.5.  Iron studies with a normal iron, normal ferritin, normal TIBC.  Patient has had a significant downtrend in her hemoglobin over the last 1 month and is down to 2 g and with excessive fatigue reassured decision making and agreed to transfuse the  patient 1 unit and have her follow-up outpatient with hematology.  I placed an outpatient hematology referral.  Plan for discharge after completion of 1 unit packed red blood cells.   Additional history obtained: -Additional history obtained from caregiver -External records from outside source obtained and reviewed including: Chart review including previous notes, labs, imaging, consultation notes   Lab Tests: -I ordered, reviewed, and interpreted labs.   The pertinent results include:   Labs Reviewed  COMPREHENSIVE METABOLIC PANEL WITH GFR - Abnormal; Notable for the following components:      Result Value   Glucose, Bld 113 (*)    Creatinine, Ser 1.46 (*)    Calcium  8.3 (*)    Albumin 2.8 (*)    GFR, Estimated 37 (*)    All other components within normal limits  CBC WITH DIFFERENTIAL/PLATELET - Abnormal; Notable for the following components:   RBC 2.29 (*)    Hemoglobin 7.2 (*)    HCT 23.7 (*)    MCV 103.5 (*)    Platelets 502 (*)    Abs Immature Granulocytes 0.10 (*)    All other components within normal limits  IRON AND TIBC  FERRITIN  POC OCCULT BLOOD, ED  TYPE AND SCREEN  PREPARE RBC (CROSSMATCH)  ABO/RH     Medicines ordered and prescription drug management: Meds ordered this encounter  Medications   0.9 %  sodium chloride  infusion (Manually program via Guardrails IV Fluids)   ondansetron  (ZOFRAN ) injection 4 mg    -I have reviewed the patients home medicines and have made adjustments as needed  Critical interventions Packed red blood cell administration   Cardiac Monitoring: The patient was maintained on a cardiac monitor.  I personally viewed and interpreted the cardiac monitored which showed an underlying rhythm of: NSR  Social Determinants of Health:  Factors impacting patients care include: Lives in skilled nursing facility   Reevaluation: After the interventions noted above, I reevaluated the patient and found  that they have :improved  Co  morbidities that complicate the patient evaluation  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   Depression    Fibromyalgia    Hypertension    Neuropathy    Rheumatoid arthritis (HCC)    Stroke (HCC) 02/17/14      Dispostion: I considered admission for this patient, but at this time she does not meet inpatient criteria for admission and will be discharged with outpatient hematology follow-up     Final Clinical Impression(s) / ED Diagnoses Final diagnoses:  Symptomatic anemia     @PCDICTATION @    Albertina Dixon, MD 12/23/23 2012

## 2023-12-24 DIAGNOSIS — M069 Rheumatoid arthritis, unspecified: Secondary | ICD-10-CM | POA: Diagnosis not present

## 2023-12-24 DIAGNOSIS — D649 Anemia, unspecified: Secondary | ICD-10-CM | POA: Diagnosis not present

## 2023-12-24 DIAGNOSIS — M6281 Muscle weakness (generalized): Secondary | ICD-10-CM | POA: Diagnosis not present

## 2023-12-24 DIAGNOSIS — Z9119 Patient's noncompliance with other medical treatment and regimen due to financial hardship: Secondary | ICD-10-CM | POA: Diagnosis not present

## 2023-12-24 LAB — TYPE AND SCREEN
ABO/RH(D): O POS
Antibody Screen: NEGATIVE
Unit division: 0

## 2023-12-24 LAB — BPAM RBC
Blood Product Expiration Date: 202508072359
ISSUE DATE / TIME: 202507161642
Unit Type and Rh: 5100

## 2023-12-24 MED ORDER — NYSTATIN 100000 UNIT/GM EX OINT
TOPICAL_OINTMENT | Freq: Two times a day (BID) | CUTANEOUS | Status: DC
  Administered 2023-12-24: 1 via TOPICAL
  Filled 2023-12-24: qty 15

## 2023-12-24 MED ORDER — ONDANSETRON HCL 4 MG/2ML IJ SOLN
4.0000 mg | Freq: Once | INTRAMUSCULAR | Status: AC
  Administered 2023-12-24: 4 mg via INTRAVENOUS
  Filled 2023-12-24: qty 2

## 2023-12-28 DIAGNOSIS — N952 Postmenopausal atrophic vaginitis: Secondary | ICD-10-CM | POA: Diagnosis not present

## 2023-12-28 DIAGNOSIS — N3020 Other chronic cystitis without hematuria: Secondary | ICD-10-CM | POA: Diagnosis not present

## 2023-12-28 DIAGNOSIS — D582 Other hemoglobinopathies: Secondary | ICD-10-CM | POA: Diagnosis not present

## 2023-12-29 DIAGNOSIS — D649 Anemia, unspecified: Secondary | ICD-10-CM | POA: Diagnosis not present

## 2023-12-29 DIAGNOSIS — M6281 Muscle weakness (generalized): Secondary | ICD-10-CM | POA: Diagnosis not present

## 2023-12-29 DIAGNOSIS — M069 Rheumatoid arthritis, unspecified: Secondary | ICD-10-CM | POA: Diagnosis not present

## 2023-12-29 DIAGNOSIS — Z9119 Patient's noncompliance with other medical treatment and regimen due to financial hardship: Secondary | ICD-10-CM | POA: Diagnosis not present

## 2023-12-30 ENCOUNTER — Ambulatory Visit (HOSPITAL_COMMUNITY)
Admission: RE | Admit: 2023-12-30 | Discharge: 2023-12-30 | Disposition: A | Discharge status: Home / Self Care | Source: Ambulatory Visit | Attending: Emergency Medicine | Admitting: Emergency Medicine

## 2023-12-30 DIAGNOSIS — R918 Other nonspecific abnormal finding of lung field: Secondary | ICD-10-CM | POA: Diagnosis not present

## 2023-12-30 DIAGNOSIS — J9811 Atelectasis: Secondary | ICD-10-CM | POA: Diagnosis not present

## 2023-12-31 DIAGNOSIS — M069 Rheumatoid arthritis, unspecified: Secondary | ICD-10-CM | POA: Diagnosis not present

## 2023-12-31 DIAGNOSIS — M6281 Muscle weakness (generalized): Secondary | ICD-10-CM | POA: Diagnosis not present

## 2023-12-31 DIAGNOSIS — D649 Anemia, unspecified: Secondary | ICD-10-CM | POA: Diagnosis not present

## 2023-12-31 DIAGNOSIS — Z9119 Patient's noncompliance with other medical treatment and regimen due to financial hardship: Secondary | ICD-10-CM | POA: Diagnosis not present

## 2024-01-02 DIAGNOSIS — R7981 Abnormal blood-gas level: Secondary | ICD-10-CM | POA: Diagnosis not present

## 2024-01-02 DIAGNOSIS — D582 Other hemoglobinopathies: Secondary | ICD-10-CM | POA: Diagnosis not present

## 2024-01-04 ENCOUNTER — Encounter (HOSPITAL_COMMUNITY): Payer: Self-pay | Admitting: Emergency Medicine

## 2024-01-04 ENCOUNTER — Other Ambulatory Visit: Payer: Self-pay

## 2024-01-04 ENCOUNTER — Emergency Department (HOSPITAL_COMMUNITY)

## 2024-01-04 ENCOUNTER — Inpatient Hospital Stay (HOSPITAL_COMMUNITY)
Admission: EM | Admit: 2024-01-04 | Discharge: 2024-01-09 | DRG: 689 | Disposition: A | Discharge status: Skilled Nursing Facility | Source: Skilled Nursing Facility | Attending: Family Medicine | Admitting: Family Medicine

## 2024-01-04 DIAGNOSIS — Z8673 Personal history of transient ischemic attack (TIA), and cerebral infarction without residual deficits: Secondary | ICD-10-CM | POA: Diagnosis not present

## 2024-01-04 DIAGNOSIS — R0602 Shortness of breath: Secondary | ICD-10-CM | POA: Diagnosis not present

## 2024-01-04 DIAGNOSIS — Z79818 Long term (current) use of other agents affecting estrogen receptors and estrogen levels: Secondary | ICD-10-CM

## 2024-01-04 DIAGNOSIS — Z888 Allergy status to other drugs, medicaments and biological substances status: Secondary | ICD-10-CM

## 2024-01-04 DIAGNOSIS — N3001 Acute cystitis with hematuria: Principal | ICD-10-CM

## 2024-01-04 DIAGNOSIS — N136 Pyonephrosis: Principal | ICD-10-CM | POA: Diagnosis present

## 2024-01-04 DIAGNOSIS — N390 Urinary tract infection, site not specified: Secondary | ICD-10-CM | POA: Diagnosis present

## 2024-01-04 DIAGNOSIS — Z7401 Bed confinement status: Secondary | ICD-10-CM | POA: Diagnosis not present

## 2024-01-04 DIAGNOSIS — M4312 Spondylolisthesis, cervical region: Secondary | ICD-10-CM | POA: Diagnosis not present

## 2024-01-04 DIAGNOSIS — I517 Cardiomegaly: Secondary | ICD-10-CM | POA: Diagnosis not present

## 2024-01-04 DIAGNOSIS — M4804 Spinal stenosis, thoracic region: Secondary | ICD-10-CM | POA: Diagnosis not present

## 2024-01-04 DIAGNOSIS — Z91013 Allergy to seafood: Secondary | ICD-10-CM

## 2024-01-04 DIAGNOSIS — D631 Anemia in chronic kidney disease: Secondary | ICD-10-CM | POA: Diagnosis present

## 2024-01-04 DIAGNOSIS — R4182 Altered mental status, unspecified: Secondary | ICD-10-CM | POA: Diagnosis not present

## 2024-01-04 DIAGNOSIS — I129 Hypertensive chronic kidney disease with stage 1 through stage 4 chronic kidney disease, or unspecified chronic kidney disease: Secondary | ICD-10-CM | POA: Diagnosis present

## 2024-01-04 DIAGNOSIS — Z1612 Extended spectrum beta lactamase (ESBL) resistance: Secondary | ICD-10-CM | POA: Diagnosis present

## 2024-01-04 DIAGNOSIS — M069 Rheumatoid arthritis, unspecified: Secondary | ICD-10-CM | POA: Diagnosis present

## 2024-01-04 DIAGNOSIS — M549 Dorsalgia, unspecified: Secondary | ICD-10-CM | POA: Diagnosis present

## 2024-01-04 DIAGNOSIS — Z880 Allergy status to penicillin: Secondary | ICD-10-CM

## 2024-01-04 DIAGNOSIS — G629 Polyneuropathy, unspecified: Secondary | ICD-10-CM | POA: Diagnosis present

## 2024-01-04 DIAGNOSIS — M797 Fibromyalgia: Secondary | ICD-10-CM | POA: Diagnosis present

## 2024-01-04 DIAGNOSIS — I771 Stricture of artery: Secondary | ICD-10-CM | POA: Diagnosis not present

## 2024-01-04 DIAGNOSIS — G8929 Other chronic pain: Secondary | ICD-10-CM | POA: Diagnosis present

## 2024-01-04 DIAGNOSIS — Z882 Allergy status to sulfonamides status: Secondary | ICD-10-CM

## 2024-01-04 DIAGNOSIS — R410 Disorientation, unspecified: Secondary | ICD-10-CM | POA: Diagnosis not present

## 2024-01-04 DIAGNOSIS — Z8249 Family history of ischemic heart disease and other diseases of the circulatory system: Secondary | ICD-10-CM | POA: Diagnosis not present

## 2024-01-04 DIAGNOSIS — M5116 Intervertebral disc disorders with radiculopathy, lumbar region: Secondary | ICD-10-CM | POA: Diagnosis not present

## 2024-01-04 DIAGNOSIS — M50322 Other cervical disc degeneration at C5-C6 level: Secondary | ICD-10-CM | POA: Diagnosis not present

## 2024-01-04 DIAGNOSIS — G4700 Insomnia, unspecified: Secondary | ICD-10-CM | POA: Diagnosis present

## 2024-01-04 DIAGNOSIS — M6281 Muscle weakness (generalized): Secondary | ICD-10-CM | POA: Diagnosis not present

## 2024-01-04 DIAGNOSIS — B9620 Unspecified Escherichia coli [E. coli] as the cause of diseases classified elsewhere: Secondary | ICD-10-CM | POA: Diagnosis present

## 2024-01-04 DIAGNOSIS — I10 Essential (primary) hypertension: Secondary | ICD-10-CM

## 2024-01-04 DIAGNOSIS — Z885 Allergy status to narcotic agent status: Secondary | ICD-10-CM | POA: Diagnosis not present

## 2024-01-04 DIAGNOSIS — Z87440 Personal history of urinary (tract) infections: Secondary | ICD-10-CM

## 2024-01-04 DIAGNOSIS — K429 Umbilical hernia without obstruction or gangrene: Secondary | ICD-10-CM | POA: Diagnosis not present

## 2024-01-04 DIAGNOSIS — Z7902 Long term (current) use of antithrombotics/antiplatelets: Secondary | ICD-10-CM

## 2024-01-04 DIAGNOSIS — N1832 Chronic kidney disease, stage 3b: Secondary | ICD-10-CM | POA: Diagnosis present

## 2024-01-04 DIAGNOSIS — G9341 Metabolic encephalopathy: Secondary | ICD-10-CM | POA: Diagnosis not present

## 2024-01-04 DIAGNOSIS — D649 Anemia, unspecified: Secondary | ICD-10-CM | POA: Diagnosis not present

## 2024-01-04 DIAGNOSIS — M48061 Spinal stenosis, lumbar region without neurogenic claudication: Secondary | ICD-10-CM | POA: Diagnosis not present

## 2024-01-04 DIAGNOSIS — R5381 Other malaise: Secondary | ICD-10-CM | POA: Diagnosis present

## 2024-01-04 DIAGNOSIS — M4185 Other forms of scoliosis, thoracolumbar region: Secondary | ICD-10-CM | POA: Diagnosis not present

## 2024-01-04 DIAGNOSIS — F411 Generalized anxiety disorder: Secondary | ICD-10-CM | POA: Diagnosis present

## 2024-01-04 DIAGNOSIS — Z886 Allergy status to analgesic agent status: Secondary | ICD-10-CM

## 2024-01-04 DIAGNOSIS — A499 Bacterial infection, unspecified: Secondary | ICD-10-CM | POA: Diagnosis not present

## 2024-01-04 DIAGNOSIS — R0781 Pleurodynia: Secondary | ICD-10-CM | POA: Diagnosis not present

## 2024-01-04 DIAGNOSIS — Z881 Allergy status to other antibiotic agents status: Secondary | ICD-10-CM | POA: Diagnosis not present

## 2024-01-04 DIAGNOSIS — Z887 Allergy status to serum and vaccine status: Secondary | ICD-10-CM

## 2024-01-04 DIAGNOSIS — M4184 Other forms of scoliosis, thoracic region: Secondary | ICD-10-CM | POA: Diagnosis not present

## 2024-01-04 DIAGNOSIS — N1330 Unspecified hydronephrosis: Secondary | ICD-10-CM | POA: Diagnosis not present

## 2024-01-04 DIAGNOSIS — Z1623 Resistance to quinolones and fluoroquinolones: Secondary | ICD-10-CM | POA: Diagnosis present

## 2024-01-04 DIAGNOSIS — D509 Iron deficiency anemia, unspecified: Secondary | ICD-10-CM | POA: Diagnosis not present

## 2024-01-04 DIAGNOSIS — I700 Atherosclerosis of aorta: Secondary | ICD-10-CM | POA: Diagnosis not present

## 2024-01-04 DIAGNOSIS — Z91041 Radiographic dye allergy status: Secondary | ICD-10-CM

## 2024-01-04 DIAGNOSIS — M5134 Other intervertebral disc degeneration, thoracic region: Secondary | ICD-10-CM | POA: Diagnosis not present

## 2024-01-04 DIAGNOSIS — M4802 Spinal stenosis, cervical region: Secondary | ICD-10-CM | POA: Diagnosis not present

## 2024-01-04 DIAGNOSIS — Z823 Family history of stroke: Secondary | ICD-10-CM

## 2024-01-04 DIAGNOSIS — N952 Postmenopausal atrophic vaginitis: Secondary | ICD-10-CM

## 2024-01-04 DIAGNOSIS — N3000 Acute cystitis without hematuria: Secondary | ICD-10-CM | POA: Diagnosis not present

## 2024-01-04 DIAGNOSIS — G894 Chronic pain syndrome: Secondary | ICD-10-CM | POA: Diagnosis not present

## 2024-01-04 DIAGNOSIS — K219 Gastro-esophageal reflux disease without esophagitis: Secondary | ICD-10-CM | POA: Diagnosis present

## 2024-01-04 DIAGNOSIS — Z79620 Long term (current) use of immunosuppressive biologic: Secondary | ICD-10-CM

## 2024-01-04 DIAGNOSIS — B961 Klebsiella pneumoniae [K. pneumoniae] as the cause of diseases classified elsewhere: Secondary | ICD-10-CM | POA: Diagnosis present

## 2024-01-04 DIAGNOSIS — M50222 Other cervical disc displacement at C5-C6 level: Secondary | ICD-10-CM | POA: Diagnosis not present

## 2024-01-04 DIAGNOSIS — N179 Acute kidney failure, unspecified: Secondary | ICD-10-CM | POA: Diagnosis not present

## 2024-01-04 DIAGNOSIS — K5730 Diverticulosis of large intestine without perforation or abscess without bleeding: Secondary | ICD-10-CM | POA: Diagnosis not present

## 2024-01-04 DIAGNOSIS — Z79899 Other long term (current) drug therapy: Secondary | ICD-10-CM

## 2024-01-04 DIAGNOSIS — I959 Hypotension, unspecified: Secondary | ICD-10-CM | POA: Diagnosis not present

## 2024-01-04 DIAGNOSIS — Z806 Family history of leukemia: Secondary | ICD-10-CM

## 2024-01-04 DIAGNOSIS — R278 Other lack of coordination: Secondary | ICD-10-CM | POA: Diagnosis not present

## 2024-01-04 DIAGNOSIS — R509 Fever, unspecified: Secondary | ICD-10-CM | POA: Diagnosis not present

## 2024-01-04 LAB — URINALYSIS, ROUTINE W REFLEX MICROSCOPIC
Bilirubin Urine: NEGATIVE
Glucose, UA: NEGATIVE mg/dL
Ketones, ur: NEGATIVE mg/dL
Nitrite: POSITIVE — AB
Protein, ur: 100 mg/dL — AB
RBC / HPF: >50 RBC/hpf (ref 0–5)
Specific Gravity, Urine: 1.016 (ref 1.005–1.030)
WBC, UA: >50 WBC/hpf (ref 0–5)
pH: 5 (ref 5.0–8.0)

## 2024-01-04 LAB — CBC WITH DIFFERENTIAL/PLATELET
Abs Immature Granulocytes: 0.19 K/uL — ABNORMAL HIGH (ref 0.00–0.07)
Basophils Absolute: 0.1 K/uL (ref 0.0–0.1)
Basophils Relative: 1 %
Eosinophils Absolute: 0.1 K/uL (ref 0.0–0.5)
Eosinophils Relative: 0 %
HCT: 26.4 % — ABNORMAL LOW (ref 36.0–46.0)
Hemoglobin: 7.9 g/dL — ABNORMAL LOW (ref 12.0–15.0)
Immature Granulocytes: 1 %
Lymphocytes Relative: 8 %
Lymphs Abs: 1.5 K/uL (ref 0.7–4.0)
MCH: 30.3 pg (ref 26.0–34.0)
MCHC: 29.9 g/dL — ABNORMAL LOW (ref 30.0–36.0)
MCV: 101.1 fL — ABNORMAL HIGH (ref 80.0–100.0)
Monocytes Absolute: 1.4 K/uL — ABNORMAL HIGH (ref 0.1–1.0)
Monocytes Relative: 7 %
Neutro Abs: 15.7 K/uL — ABNORMAL HIGH (ref 1.7–7.7)
Neutrophils Relative %: 83 %
Platelets: 573 K/uL — ABNORMAL HIGH (ref 150–400)
RBC: 2.61 MIL/uL — ABNORMAL LOW (ref 3.87–5.11)
RDW: 14.8 % (ref 11.5–15.5)
WBC: 19 K/uL — ABNORMAL HIGH (ref 4.0–10.5)
nRBC: 0 % (ref 0.0–0.2)

## 2024-01-04 LAB — COMPREHENSIVE METABOLIC PANEL WITH GFR
ALT: 9 U/L (ref 0–44)
AST: 13 U/L — ABNORMAL LOW (ref 15–41)
Albumin: 2.5 g/dL — ABNORMAL LOW (ref 3.5–5.0)
Alkaline Phosphatase: 165 U/L — ABNORMAL HIGH (ref 38–126)
Anion gap: 11 (ref 5–15)
BUN: 25 mg/dL — ABNORMAL HIGH (ref 8–23)
CO2: 20 mmol/L — ABNORMAL LOW (ref 22–32)
Calcium: 8.3 mg/dL — ABNORMAL LOW (ref 8.9–10.3)
Chloride: 103 mmol/L (ref 98–111)
Creatinine, Ser: 1.96 mg/dL — ABNORMAL HIGH (ref 0.44–1.00)
GFR, Estimated: 26 mL/min — ABNORMAL LOW (ref >60)
Glucose, Bld: 138 mg/dL — ABNORMAL HIGH (ref 70–99)
Potassium: 4.9 mmol/L (ref 3.5–5.1)
Sodium: 134 mmol/L — ABNORMAL LOW (ref 135–145)
Total Bilirubin: 1.2 mg/dL (ref 0.0–1.2)
Total Protein: 6.8 g/dL (ref 6.5–8.1)

## 2024-01-04 MED ORDER — CIPROFLOXACIN IN D5W 400 MG/200ML IV SOLN
400.0000 mg | Freq: Once | INTRAVENOUS | Status: AC
  Administered 2024-01-04: 400 mg via INTRAVENOUS
  Filled 2024-01-04: qty 200

## 2024-01-04 MED ORDER — SODIUM CHLORIDE 0.9 % IV BOLUS
500.0000 mL | Freq: Once | INTRAVENOUS | Status: AC
  Administered 2024-01-04: 500 mL via INTRAVENOUS

## 2024-01-04 NOTE — ED Notes (Signed)
 ED Provider at bedside.

## 2024-01-04 NOTE — ED Notes (Signed)
 Pt given water 8oz per request-

## 2024-01-04 NOTE — ED Notes (Signed)
 Urine already in lab to culture

## 2024-01-04 NOTE — ED Notes (Signed)
 Patient transported to CT

## 2024-01-04 NOTE — ED Provider Notes (Signed)
 [name] [facility] AT [facility] Provider Note   CSN: [id number] Arrival date & time: 01/04/24  8166     Patient presents with: Altered Mental Status   Robin Blackburn is a 78 y.o. female.  {Add pertinent medical, surgical, social history, OB history to YEP:67052} Patient has a history of a stroke and hypertension.  She is in a nursing home.  She has become more confused recently and she has been on Macrobid  for UTI   Altered Mental Status      Prior to Admission medications   Medication Sig Start Date End Date Taking? Authorizing Provider  acetaminophen  (TYLENOL ) 500 MG tablet Take 1,000 mg by mouth every 6 (six) hours as needed for mild pain (pain score 1-3) or moderate pain (pain score 4-6).    [provider]  Adalimumab (HUMIRA, 2 PEN,) 40 MG/0.8ML PNKT Inject 40 mg into the skin every 14 (fourteen) days.    [provider]  ALPRAZolam  (XANAX ) 0.5 MG tablet Take 0.5 mg by mouth 2 (two) times daily. 12/01/23   [provider]  amLODipine  (NORVASC ) 2.5 MG tablet Take 2.5 mg by mouth daily. 12/10/23   [provider]  ascorbic acid (VITAMIN C) 500 MG tablet Take 500 mg by mouth daily.    [provider]  chlorhexidine (PERIDEX) 0.12 % solution Use as directed 15 mLs in the mouth or throat 2 (two) times daily.    [provider]  Cholecalciferol  5000 units TABS Take 1 tablet by mouth daily.     [provider]  Cranberry 450 MG TABS Take 1 tablet by mouth in the morning and at bedtime. For UTI    [provider]  cycloSPORINE  (RESTASIS ) 0.05 % ophthalmic emulsion Place 1 drop into both eyes 2 (two) times daily. Dry eyes    [provider]  diclofenac  Sodium (VOLTAREN ) 1 % GEL Apply 2 g topically in the morning and at bedtime. Apply to posterior neck 05/20/22   [provider]  dicyclomine  (BENTYL ) 20 MG tablet Take 20 mg by mouth every 6 (six) hours. Abdominal cramps 01/31/14    [provider]  estradiol  (ESTRACE ) 0.1 MG/GM vaginal cream Discard plastic applicator. Insert a blueberry size amount (approximately 1 gram) of cream on fingertip inside vagina at bedtime every night for 1 week then every other night for long term use. Patient taking differently: Place 1 Applicatorful vaginally every other day. Discard plastic applicator. Insert a blueberry size amount (approximately 1 gram) of cream on fingertip inside vagina at bedtime every other night for long term use. 12/05/22   Gerldine, Lauraine BROCKS, FNP  fluticasone (FLONASE) 50 MCG/ACT nasal spray Place 1 spray into both nostrils in the morning and at bedtime.    [provider]  folic acid  (FOLVITE ) 1 MG tablet Take 1 mg by mouth daily.    [provider]  gabapentin  (NEURONTIN ) 300 MG capsule Take 1 capsule (300 mg total) by mouth 3 (three) times daily. 03/25/22   Debby Fidela CROME, NP  guaiFENesin (MUCINEX) 600 MG 12 hr tablet Take 600 mg by mouth 2 (two) times daily as needed (congestion).    [provider]  ibuprofen  (ADVIL ) 400 MG tablet Take 400 mg by mouth every 12 (twelve) hours as needed for mild pain (pain score 1-3). 10/20/23   [provider]  lansoprazole (PREVACID) 30 MG capsule Take 60 mg by mouth 2 (two) times daily before a meal.    [provider]  leflunomide  (ARAVA ) 10 MG tablet Take 10 mg by mouth daily. 08/01/20   [provider]  lidocaine  (LIDODERM ) 5 % Place 1 patch onto the skin every 12 (twelve) hours. Remove & Discard patch within 12 hours or as directed 12/24/21   Debby Fidela CROME, NP  Lidocaine , Anorectal, (HEMORRHOIDAL RELIEF) 5 % CREA Apply 1 Application topically every 8 (eight) hours as needed (hemorrhoids).    [provider]  loperamide  (IMODIUM  A-D) 2 MG tablet Take 2 mg by mouth every 6 (six) hours as needed for diarrhea or loose stools.    [provider]  meclizine  (ANTIVERT ) 12.5 MG tablet Take 12.5 mg by mouth  3 (three) times daily.    [provider]  melatonin 5 MG TABS Take 10 mg by mouth at bedtime.    [provider]  methenamine  (HIPREX ) 1 g tablet Take 1 g by mouth 2 (two) times daily. Prophylactic 10/21/23   [provider]  methocarbamol  (ROBAXIN ) 500 MG tablet Take 500 mg by mouth every 6 (six) hours as needed for muscle spasms. 10/30/23   [provider]  metoprolol  tartrate (LOPRESSOR ) 25 MG tablet Take 1 tablet (25 mg total) by mouth 2 (two) times daily. 09/28/15   Tish Elsie FALCON, MD  Multiple Vitamins-Minerals (PRESERVISION AREDS 2) CAPS Take 1 capsule by mouth in the morning and at bedtime.    [provider]  nitrofurantoin , macrocrystal-monohydrate, (MACROBID ) 100 MG capsule Take 100 mg by mouth daily. Prophylaxis for UTI 10/28/23   [provider]  Nutritional Supplement LIQD Take 120 mLs by mouth daily at 6 PM.    [provider]  ondansetron  (ZOFRAN ) 4 MG tablet Take 4 mg by mouth every 6 (six) hours as needed for nausea or vomiting.     [provider]  phenazopyridine  (PYRIDIUM ) 100 MG tablet Take 1 tablet (100 mg total) by mouth 2 (two) times daily as needed for pain. Patient taking differently: Take 100 mg by mouth every 8 (eight) hours as needed (dysuria (painful urination)). 01/20/22   McKenzie, Belvie CROME, MD  Probiotic, Lactobacillus, CAPS Take 1 capsule by mouth in the morning, at noon, and at bedtime.    [provider]  simethicone  (MYLICON) 125 MG chewable tablet Chew 125 mg by mouth every 6 (six) hours as needed for flatulence.    [provider]  ticagrelor  (BRILINTA ) 60 MG TABS tablet Take 60 mg by mouth daily.    [provider]  torsemide  (DEMADEX ) 20 MG tablet Take 20 mg by mouth daily. 08/31/23   [provider]  traMADol  (ULTRAM ) 50 MG tablet Take 1 tablet (50 mg total) by mouth every 8 (eight) hours as needed. Patient taking differently: Take 50 mg by mouth in the  morning, at noon, and at bedtime. 07/03/22   Debby Fidela CROME, NP  traZODone  (DESYREL ) 100 MG tablet Take 100 mg by mouth at bedtime. 02/25/23   [provider]  vitamin B-12 (CYANOCOBALAMIN) 250 MCG tablet Take 250 mcg by mouth daily.    [provider]  vitamin E 1000 UNIT capsule Take 1,000 Units by mouth daily.    [provider]    Allergies: Cortisone, Fentanyl , Hydromorphone , Iodinated contrast media, Keflex  [cephalexin ], Meperidine, Morphine, Oxycodone , Oxycodone -acetaminophen , Penicillins, Acetaminophen , Afluria preservative free [influenza virus vacc split pf], Aspirin , Ciprofloxacin  hcl, Codeine, Influenza vaccines, Influenza virus vaccine, Iohexol, Meperidine hcl, Misc. sulfonamide containing compounds, Oxycodone  hcl, Penicillamine, Shellfish allergy, Sulfa  antibiotics, Troleandomycin, Bisphosphonates, Ciprofloxacin , Iodine, Levaquin [levofloxacin in d5w], Levofloxacin,  Morphine and codeine, Oxytetracycline, Percocet [oxycodone -acetaminophen ], and Sulfur    Review of Systems  Updated Vital Signs BP 124/63   Pulse 98   Temp 98.6 F (37 C) (Oral)   Resp 18   Ht 5' 6 (1.676 m)   Wt 68.9 kg   SpO2 96%   BMI 24.52 kg/m   Physical Exam  (all labs ordered are listed, but only abnormal results are displayed) Labs Reviewed  CBC WITH DIFFERENTIAL/PLATELET - Abnormal; Notable for the following components:      Result Value   WBC 19.0 (*)    RBC 2.61 (*)    Hemoglobin 7.9 (*)    HCT 26.4 (*)    MCV 101.1 (*)    MCHC 29.9 (*)    Platelets 573 (*)    Neutro Abs 15.7 (*)    Monocytes Absolute 1.4 (*)    Abs Immature Granulocytes 0.19 (*)    All other components within normal limits  COMPREHENSIVE METABOLIC PANEL WITH GFR - Abnormal; Notable for the following components:   Sodium 134 (*)    CO2 20 (*)    Glucose, Bld 138 (*)    BUN 25 (*)    Creatinine, Ser 1.96 (*)    Calcium  8.3 (*)    Albumin 2.5 (*)    AST 13 (*)    Alkaline Phosphatase 165  (*)    GFR, Estimated 26 (*)    All other components within normal limits  URINALYSIS, ROUTINE W REFLEX MICROSCOPIC - Abnormal; Notable for the following components:   Color, Urine ORANGE (*)    APPearance TURBID (*)    Hgb urine dipstick MODERATE (*)    Protein, ur 100 (*)    Nitrite POSITIVE (*)    Leukocytes,Ua SMALL (*)    Bacteria, UA MANY (*)    Non Squamous Epithelial 0-5 (*)    All other components within normal limits  URINE CULTURE    EKG: None  Radiology: CT Head Wo Contrast Result Date: 01/04/2024 CLINICAL DATA:  Mental status change, unknown cause EXAM: CT HEAD WITHOUT CONTRAST TECHNIQUE: Contiguous axial images were obtained from the base of the skull through the vertex without intravenous contrast. RADIATION DOSE REDUCTION: This exam was performed according to the departmental dose-optimization program which includes automated exposure control, adjustment of the mA and/or kV according to patient size and/or use of iterative reconstruction technique. COMPARISON:  Nov 05, 2023 FINDINGS: Brain: Proportional prominence of the ventricles and sulci, consistent with diffuse cerebral parenchymal volume loss. The ventricles otherwise maintained midline position without midline shift. Gray-white differentiation is preserved.Periventricular and subcortical white matter hypoattenuation, most consistent with changes of moderate chronic ischemic microvascular disease.No evidence of acute territorial infarction, extra-axial fluid collection, hemorrhage, or mass lesion. 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 The basilar cisterns are patent without downward herniation. The cerebellar hemispheres and vermis are well formed without mass lesion or focal attenuation abnormality. Vascular: No hyperdense vessel. Skull: Normal. Negative for fracture or focal lesion. Sinuses/Orbits: The paranasal sinuses and mastoids are clear.The globes appear intact. No retrobulbar hematoma. Other: None.  IMPRESSION: 1. No acute intracranial abnormality, specifically, no acute hemorrhage, territorial infarction, or intracranial mass. 2. Global cerebral volume loss with sequelae of chronic ischemic microvascular disease. Electronically Signed   By: Rogelia Myers M.D.   On: 01/04/2024 21:17   DG Chest Port 1 View Result Date: 01/04/2024 CLINICAL DATA:  sob EXAM: PORTABLE CHEST - 1 VIEW COMPARISON:  August 20, 2018 FINDINGS: No focal airspace  consolidation, pleural effusion, or pneumothorax. Moderate cardiomegaly. Tortuous aorta with aortic atherosclerosis. No acute fracture or destructive lesions. Multilevel thoracic osteophytosis. IMPRESSION: No acute cardiopulmonary abnormality. Electronically Signed   By: Rogelia Myers M.D.   On: 01/04/2024 19:59    {Document cardiac monitor, telemetry assessment procedure when appropriate:32947} Procedures   Medications Ordered in the ED  ciprofloxacin  (CIPRO ) IVPB 400 mg (400 mg Intravenous New Bag/Given 01/04/24 2135)  sodium chloride  0.9 % bolus 500 mL (0 mLs Intravenous Stopped 01/04/24 2119)      {Click here for ABCD2, HEART and other calculators REFRESH Note before signing:1}                              Medical Decision Making Amount and/or Complexity of Data Reviewed Labs: ordered. Radiology: ordered.  Risk Prescription drug management. Decision regarding hospitalization.   Patient with worsening urinary tract infection.  She will be admitted to medicine  {Document critical care time when appropriate  Document review of labs and clinical decision tools ie CHADS2VASC2, etc  Document your independent review of radiology images and any outside records  Document your discussion with family members, caretakers and with consultants  Document social determinants of health affecting pt's care  Document your decision making why or why not admission, treatments were needed:32947:::1}   Final diagnoses:  Acute cystitis with hematuria  AKI (acute  kidney injury) [facility])    ED Discharge Orders     None

## 2024-01-04 NOTE — ED Triage Notes (Signed)
 Per facility pt is being treated for UTI. Facility states pt has been acting normal today and when family came to visit they states she is not herself and requested her to be sent here.

## 2024-01-05 DIAGNOSIS — N3001 Acute cystitis with hematuria: Secondary | ICD-10-CM

## 2024-01-05 LAB — CBC
HCT: 21.7 % — ABNORMAL LOW (ref 36.0–46.0)
Hemoglobin: 6.7 g/dL — CL (ref 12.0–15.0)
MCH: 31.3 pg (ref 26.0–34.0)
MCHC: 30.9 g/dL (ref 30.0–36.0)
MCV: 101.4 fL — ABNORMAL HIGH (ref 80.0–100.0)
Platelets: 464 K/uL — ABNORMAL HIGH (ref 150–400)
RBC: 2.14 MIL/uL — ABNORMAL LOW (ref 3.87–5.11)
RDW: 14.9 % (ref 11.5–15.5)
WBC: 16.1 K/uL — ABNORMAL HIGH (ref 4.0–10.5)
nRBC: 0 % (ref 0.0–0.2)

## 2024-01-05 LAB — COMPREHENSIVE METABOLIC PANEL WITH GFR
ALT: 9 U/L (ref 0–44)
AST: 11 U/L — ABNORMAL LOW (ref 15–41)
Albumin: 2.1 g/dL — ABNORMAL LOW (ref 3.5–5.0)
Alkaline Phosphatase: 141 U/L — ABNORMAL HIGH (ref 38–126)
Anion gap: 9 (ref 5–15)
BUN: 24 mg/dL — ABNORMAL HIGH (ref 8–23)
CO2: 20 mmol/L — ABNORMAL LOW (ref 22–32)
Calcium: 7.7 mg/dL — ABNORMAL LOW (ref 8.9–10.3)
Chloride: 105 mmol/L (ref 98–111)
Creatinine, Ser: 1.73 mg/dL — ABNORMAL HIGH (ref 0.44–1.00)
GFR, Estimated: 30 mL/min — ABNORMAL LOW (ref >60)
Glucose, Bld: 118 mg/dL — ABNORMAL HIGH (ref 70–99)
Potassium: 4.4 mmol/L (ref 3.5–5.1)
Sodium: 134 mmol/L — ABNORMAL LOW (ref 135–145)
Total Bilirubin: 1.3 mg/dL — ABNORMAL HIGH (ref 0.0–1.2)
Total Protein: 5.8 g/dL — ABNORMAL LOW (ref 6.5–8.1)

## 2024-01-05 LAB — OCCULT BLOOD X 1 CARD TO LAB, STOOL: Fecal Occult Bld: NEGATIVE

## 2024-01-05 LAB — IRON AND TIBC
Iron: 10 ug/dL — ABNORMAL LOW (ref 28–170)
Saturation Ratios: 7 % — ABNORMAL LOW (ref 10.4–31.8)
TIBC: 152 ug/dL — ABNORMAL LOW (ref 250–450)
UIBC: 142 ug/dL

## 2024-01-05 LAB — PREPARE RBC (CROSSMATCH)

## 2024-01-05 LAB — HEMOGLOBIN AND HEMATOCRIT, BLOOD
HCT: 20.9 % — ABNORMAL LOW (ref 36.0–46.0)
HCT: 32.7 % — ABNORMAL LOW (ref 36.0–46.0)
Hemoglobin: 6.3 g/dL — CL (ref 12.0–15.0)
Hemoglobin: 10.2 g/dL — ABNORMAL LOW (ref 12.0–15.0)

## 2024-01-05 MED ORDER — TORSEMIDE 20 MG PO TABS
20.0000 mg | ORAL_TABLET | Freq: Every day | ORAL | Status: DC
  Filled 2024-01-05: qty 1

## 2024-01-05 MED ORDER — ALPRAZOLAM 0.5 MG PO TABS
0.5000 mg | ORAL_TABLET | Freq: Two times a day (BID) | ORAL | Status: DC
  Administered 2024-01-05 (×3): 0.5 mg via ORAL
  Filled 2024-01-05 (×3): qty 1

## 2024-01-05 MED ORDER — ACETAMINOPHEN 325 MG PO TABS
650.0000 mg | ORAL_TABLET | Freq: Four times a day (QID) | ORAL | Status: DC | PRN
  Administered 2024-01-05 – 2024-01-07 (×5): 650 mg via ORAL
  Filled 2024-01-05 (×5): qty 2

## 2024-01-05 MED ORDER — AMLODIPINE BESYLATE 5 MG PO TABS
2.5000 mg | ORAL_TABLET | Freq: Every day | ORAL | Status: DC
  Filled 2024-01-05: qty 1

## 2024-01-05 MED ORDER — TRAZODONE HCL 50 MG PO TABS
100.0000 mg | ORAL_TABLET | Freq: Every day | ORAL | Status: DC
  Administered 2024-01-05 – 2024-01-08 (×5): 100 mg via ORAL
  Filled 2024-01-05 (×5): qty 2

## 2024-01-05 MED ORDER — ONDANSETRON HCL 4 MG PO TABS
4.0000 mg | ORAL_TABLET | Freq: Four times a day (QID) | ORAL | Status: DC | PRN
  Administered 2024-01-05: 4 mg via ORAL
  Filled 2024-01-05: qty 1

## 2024-01-05 MED ORDER — METOPROLOL TARTRATE 25 MG PO TABS
12.5000 mg | ORAL_TABLET | Freq: Two times a day (BID) | ORAL | Status: DC
  Administered 2024-01-05 – 2024-01-09 (×9): 12.5 mg via ORAL
  Filled 2024-01-05 (×8): qty 1

## 2024-01-05 MED ORDER — DICYCLOMINE HCL 20 MG PO TABS
20.0000 mg | ORAL_TABLET | Freq: Four times a day (QID) | ORAL | Status: DC
  Filled 2024-01-05 (×3): qty 1

## 2024-01-05 MED ORDER — TORSEMIDE 20 MG PO TABS
20.0000 mg | ORAL_TABLET | Freq: Every day | ORAL | Status: DC
  Administered 2024-01-06 – 2024-01-09 (×4): 20 mg via ORAL
  Filled 2024-01-05 (×4): qty 1

## 2024-01-05 MED ORDER — GABAPENTIN 100 MG PO CAPS
100.0000 mg | ORAL_CAPSULE | Freq: Three times a day (TID) | ORAL | Status: DC
  Administered 2024-01-05 – 2024-01-09 (×13): 100 mg via ORAL
  Filled 2024-01-05 (×13): qty 1

## 2024-01-05 MED ORDER — LEFLUNOMIDE 10 MG PO TABS
10.0000 mg | ORAL_TABLET | Freq: Every day | ORAL | Status: DC
  Administered 2024-01-05 – 2024-01-09 (×5): 10 mg via ORAL
  Filled 2024-01-05 (×6): qty 1

## 2024-01-05 MED ORDER — GABAPENTIN 300 MG PO CAPS: 300.0000 mg | ORAL_CAPSULE | Freq: Three times a day (TID) | ORAL | Status: DC

## 2024-01-05 MED ORDER — TICAGRELOR 60 MG PO TABS
60.0000 mg | ORAL_TABLET | Freq: Every day | ORAL | Status: DC
Start: 2024-01-06 — End: 2024-01-09
  Administered 2024-01-06 – 2024-01-09 (×4): 60 mg via ORAL
  Filled 2024-01-05 (×6): qty 1

## 2024-01-05 MED ORDER — ENOXAPARIN SODIUM 30 MG/0.3ML IJ SOSY
30.0000 mg | PREFILLED_SYRINGE | INTRAMUSCULAR | Status: DC
  Filled 2024-01-05: qty 0.3

## 2024-01-05 MED ORDER — METHENAMINE HIPPURATE 1 G PO TABS: 1.0000 g | ORAL_TABLET | Freq: Two times a day (BID) | ORAL | Status: DC

## 2024-01-05 MED ORDER — SODIUM CHLORIDE 0.9% IV SOLUTION: Freq: Once | INTRAVENOUS | Status: AC

## 2024-01-05 MED ORDER — METHENAMINE MANDELATE 0.5 G PO TABS
1000.0000 mg | ORAL_TABLET | Freq: Four times a day (QID) | ORAL | Status: DC
  Administered 2024-01-06 – 2024-01-09 (×14): 1000 mg via ORAL
  Filled 2024-01-05: qty 1
  Filled 2024-01-05: qty 2
  Filled 2024-01-05 (×6): qty 1
  Filled 2024-01-05: qty 2
  Filled 2024-01-05 (×6): qty 1
  Filled 2024-01-05: qty 2
  Filled 2024-01-05: qty 1
  Filled 2024-01-05: qty 2

## 2024-01-05 MED ORDER — METOPROLOL TARTRATE 25 MG PO TABS
25.0000 mg | ORAL_TABLET | Freq: Two times a day (BID) | ORAL | Status: DC
  Filled 2024-01-05 (×2): qty 1

## 2024-01-05 MED ORDER — METHOCARBAMOL 500 MG PO TABS
500.0000 mg | ORAL_TABLET | Freq: Four times a day (QID) | ORAL | Status: DC | PRN
  Administered 2024-01-05 – 2024-01-08 (×6): 500 mg via ORAL
  Filled 2024-01-05 (×7): qty 1

## 2024-01-05 MED ORDER — ACETAMINOPHEN 325 MG PO TABS: 650.0000 mg | ORAL_TABLET | Freq: Four times a day (QID) | ORAL | Status: DC | PRN

## 2024-01-05 MED ORDER — TICAGRELOR 60 MG PO TABS
60.0000 mg | ORAL_TABLET | Freq: Every day | ORAL | Status: DC
  Filled 2024-01-05 (×2): qty 1

## 2024-01-05 MED ORDER — TRAMADOL HCL 50 MG PO TABS
50.0000 mg | ORAL_TABLET | Freq: Two times a day (BID) | ORAL | Status: DC | PRN
  Administered 2024-01-05 – 2024-01-07 (×4): 50 mg via ORAL
  Filled 2024-01-05 (×5): qty 1

## 2024-01-05 MED ORDER — SODIUM CHLORIDE 0.9 % IV SOLN
1.0000 g | Freq: Two times a day (BID) | INTRAVENOUS | Status: DC
  Administered 2024-01-05 – 2024-01-09 (×9): 1 g via INTRAVENOUS
  Filled 2024-01-05 (×9): qty 20

## 2024-01-05 MED ORDER — ONDANSETRON HCL 4 MG/2ML IJ SOLN
4.0000 mg | Freq: Four times a day (QID) | INTRAMUSCULAR | Status: DC | PRN
  Administered 2024-01-06: 4 mg via INTRAVENOUS
  Filled 2024-01-05: qty 2

## 2024-01-05 NOTE — Hospital Course (Signed)
 Robin Blackburn is a 78 y.o. female with medical history significant of prior stroke, depression, rheumatoid arthritis who presents emergency department due to headache and confusion.   Patient was at nursing home and was notably more confused.  She has had issues with recurrent urinary tract infections and was on Macrobid .  Due to mental status changes she was brought to the ER for further assessment.   ED: Was afebrile and hemodynamically stable.  Labs were obtained on presentation which showed WBC 19.0, hemoglobin 7.9, platelets 573, sodium 134, creatinine 1.96 baseline around 1, urinalysis positive for infection.  Due to extensive drug allergies patient was started on Cipro .  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   It was discussed with patient and she was unaware of any specific medications that she had allergies to.  She states she has not had anaphylaxis to any antibiotics that she is aware of.  She was alert and oriented and able to converse.  She is complaining by nursing home and wants to move to a different one.   Assessment/Plan Principal Problem:   Urinary tract infection   Acute infection encephalopathy most secondary to urinary tract infection - Patient was  on nitrofurantoin  - Last urine cultures were September 2024 with 4E faecalis with sensitivity to Cipro .  E. coli with resistance to Cipro .   Switch Cipro  to>> Meropenem    due to E. coli resistance Follow-up blood cultures Follow-up urine cultures   Acute on chronic anemia-due to anemia of chronic disease - Drop in hemoglobin noted, hypotensive, -Holding Brilinta , DVT prophylaxis of Lovenox  - Proceed with 2U PRBC blood transfusion - Obtaining anemia workup including iron studies, Hemoccult     Latest Ref Rng & Units 01/05/2024    4:34 AM 01/04/2024    7:35 PM 12/23/2023   12:21 PM  CBC  WBC 4.0 - 10.5 K/uL 16.1  19.0   10.1   Hemoglobin 12.0 - 15.0 g/dL 6.7  7.9  7.2   Hematocrit 36.0 - 46.0 % 21.7  26.4  23.7   Platelets 150 - 400 K/uL 464  573  502      Generalized anxiety-continue Xanax    Hypertension-hypotensive, holding amlodipine , reducing metoprolol  dose   Chronic pain-continue gabapentin -duloxetine dose due to hypotension   Rheumatoid arthritis-continue Arava    History of stroke-continue Brilinta  (holding due acute drop in hemoglobin), continuing statins   Insomnia-continue trazodone 

## 2024-01-05 NOTE — Plan of Care (Signed)

## 2024-01-05 NOTE — H&P (Signed)
 History and Physical    Robin Blackburn FMW:[id number] DOB: 12-17-1945 DOA: 01/04/2024  PCP: Bertell Satterfield, MD   Chief Complaint: confusion, headache  HPI: Robin Blackburn is a 78 y.o. female with medical history significant of prior stroke, depression, rheumatoid arthritis who presents emergency department due to headache and confusion.  Patient was at nursing home and was notably more confused.  She has had issues with recurrent urinary tract infections and was on Macrobid .  Due to mental status changes she was brought to the ER for further assessment.  On arrival she was afebrile and hemodynamically stable.  Labs were obtained on presentation which showed WBC 19.0, hemoglobin 7.9, platelets 573, sodium 134, creatinine 1.96 baseline around 1, urinalysis positive for infection.  Due to extensive drug allergies patient was started on Cipro .  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  I spoke with patient and she was unaware of any specific medications that she had allergies to.  She states she has not had anaphylaxis to any antibiotics that she is aware of.  She was alert and oriented and able to converse.  She is complaining by nursing home and wants to move to a different one.    Review of Systems: Review of Systems  Constitutional:  Positive for fever.  HENT: Negative.    Eyes: Negative.   Respiratory: Negative.    Cardiovascular: Negative.   Gastrointestinal: Negative.   Genitourinary: Negative.   Musculoskeletal: Negative.   Skin: Negative.   Neurological:  Positive for dizziness, weakness and headaches.  Endo/Heme/Allergies: Negative.   Psychiatric/Behavioral: Negative.       As per HPI otherwise 10 point review of systems negative.   Allergies  Allergen Reactions   Cortisone Anaphylaxis    Cardiovascular Arrest   Fentanyl  Anaphylaxis, Hives and Other (See Comments)    felt  like I had demons in my head   Hydromorphone  Anaphylaxis and Nausea And Vomiting    GI Intolerance   Iodinated Contrast Media Anaphylaxis, Hives, Rash and Dermatitis    Respiratory Distress  Iodinated contrast media (substance)   Keflex  [Cephalexin ] Nausea And Vomiting   Meperidine Anaphylaxis and Shortness Of Breath    Respiratory Distress   Morphine Anaphylaxis, Nausea And Vomiting and Swelling    GI Intolerance   Oxycodone  Anaphylaxis   Oxycodone -Acetaminophen  Nausea And Vomiting, Rash, Swelling and Dermatitis    GI Intolerance, Mouth swelling.  acetaminophen  / oxycodone    Penicillins Anaphylaxis, Nausea And Vomiting and Other (See Comments)    Immediate rash, facial/tongue/throat swelling, SOB or lightheadedness with hypotension  Product containing penicillin (product)   Acetaminophen  Other (See Comments)    Unknown    Afluria Preservative Free [Influenza Virus Vacc Split Pf] Other (See Comments)    Unknown   Aspirin  Other (See Comments)    GI Intolerance   Ciprofloxacin  Hcl Other (See Comments)    Unknown    Codeine Other (See Comments)    Unknown    Influenza Vaccines Other (See Comments)    Unknown    Influenza Virus Vaccine Other (See Comments)    Unknown    Iohexol Other (See Comments)    Unknown   Meperidine Hcl Other (See Comments)    Unknown   Misc. Sulfonamide Containing Compounds Other (See Comments)    Unknown   Oxycodone  Hcl Other (See Comments)    Unknown   Penicillamine Other (See Comments)    Unknown  Shellfish Allergy Other (See Comments)    Glucosamine not an option Unknown    Sulfa  Antibiotics Nausea And Vomiting    Tolerates Bactrim  though   Troleandomycin Nausea And Vomiting and Swelling    GI Intolerance   Bisphosphonates Hives and Other (See Comments)    GI intolerance   Ciprofloxacin  Nausea Only and Rash   Iodine Hives   Levaquin [Levofloxacin In D5w] Nausea And Vomiting and Rash   Levofloxacin Hives and Other (See Comments)     Mental Status Changes, Confusion   Morphine And Codeine Rash   Oxytetracycline Nausea And Vomiting   Percocet [Oxycodone -Acetaminophen ] Swelling    Mouth swelling.   Sulfur Nausea And Vomiting, Rash and Dermatitis    GI Intolerance  sulfur    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   Depression    Fibromyalgia    Hypertension    Neuropathy    Rheumatoid arthritis (HCC)    Stroke (HCC) 02/17/14    Past Surgical History:  Procedure Laterality Date   ABDOMINAL HYSTERECTOMY     ANKLE RECONSTRUCTION     APPENDECTOMY     BACK SURGERY     CHOLECYSTECTOMY     KNEE SURGERY       reports that she has never smoked. She has never been exposed to tobacco smoke. She has never used smokeless tobacco. She reports that she does not drink alcohol  and does not use drugs.  Family History  Problem Relation Age of Onset   Hypertension Father    Transient ischemic attack Father    Hypertension Brother    Leukemia Brother    CVA Maternal Grandfather     Prior to Admission medications   Medication Sig Start Date End Date Taking? Authorizing Provider  acetaminophen  (TYLENOL ) 500 MG tablet Take 1,000 mg by mouth every 6 (six) hours as needed for mild pain (pain score 1-3) or moderate pain (pain score 4-6).    [provider]  Adalimumab (HUMIRA, 2 PEN,) 40 MG/0.8ML PNKT Inject 40 mg into the skin every 14 (fourteen) days.    [provider]  ALPRAZolam  (XANAX ) 0.5 MG tablet Take 0.5 mg by mouth 2 (two) times daily. 12/01/23   [provider]  amLODipine  (NORVASC ) 2.5 MG tablet Take 2.5 mg by mouth daily. 12/10/23   [provider]  ascorbic acid (VITAMIN C) 500 MG tablet Take 500 mg by mouth daily.    [provider]  chlorhexidine (PERIDEX) 0.12 % solution Use as directed 15 mLs in the mouth or throat 2 (two) times daily.    [provider]  Cholecalciferol   5000 units TABS Take 1 tablet by mouth daily.     [provider]  Cranberry 450 MG TABS Take 1 tablet by mouth in the morning and at bedtime. For UTI    [provider]  cycloSPORINE  (RESTASIS ) 0.05 % ophthalmic emulsion Place 1 drop into both eyes 2 (two) times daily. Dry eyes    [provider]  diclofenac  Sodium (VOLTAREN ) 1 % GEL Apply 2 g topically in the morning and at bedtime. Apply to posterior neck 05/20/22   [provider]  dicyclomine  (BENTYL ) 20 MG tablet Take 20 mg by mouth every 6 (six) hours. Abdominal cramps 01/31/14   [provider]  estradiol  (ESTRACE ) 0.1 MG/GM vaginal cream Discard plastic applicator. Insert a blueberry size amount (approximately 1  gram) of cream on fingertip inside vagina at bedtime every night for 1 week then every other night for long term use. Patient taking differently: Place 1 Applicatorful vaginally every other day. Discard plastic applicator. Insert a blueberry size amount (approximately 1 gram) of cream on fingertip inside vagina at bedtime every other night for long term use. 12/05/22   Gerldine, Lauraine BROCKS, FNP  fluticasone (FLONASE) 50 MCG/ACT nasal spray Place 1 spray into both nostrils in the morning and at bedtime.    [provider]  folic acid  (FOLVITE ) 1 MG tablet Take 1 mg by mouth daily.    [provider]  gabapentin  (NEURONTIN ) 300 MG capsule Take 1 capsule (300 mg total) by mouth 3 (three) times daily. 03/25/22   Debby Fidela CROME, NP  guaiFENesin (MUCINEX) 600 MG 12 hr tablet Take 600 mg by mouth 2 (two) times daily as needed (congestion).    [provider]  ibuprofen  (ADVIL ) 400 MG tablet Take 400 mg by mouth every 12 (twelve) hours as needed for mild pain (pain score 1-3). 10/20/23   [provider]  lansoprazole (PREVACID) 30 MG capsule Take 60 mg by mouth 2 (two) times daily before a meal.    [provider]  leflunomide  (ARAVA ) 10 MG tablet Take 10 mg by  mouth daily. 08/01/20   [provider]  lidocaine  (LIDODERM ) 5 % Place 1 patch onto the skin every 12 (twelve) hours. Remove & Discard patch within 12 hours or as directed 12/24/21   Debby Fidela CROME, NP  Lidocaine , Anorectal, (HEMORRHOIDAL RELIEF) 5 % CREA Apply 1 Application topically every 8 (eight) hours as needed (hemorrhoids).    [provider]  loperamide  (IMODIUM  A-D) 2 MG tablet Take 2 mg by mouth every 6 (six) hours as needed for diarrhea or loose stools.    [provider]  meclizine  (ANTIVERT ) 12.5 MG tablet Take 12.5 mg by mouth 3 (three) times daily.    [provider]  melatonin 5 MG TABS Take 10 mg by mouth at bedtime.    [provider]  methenamine  (HIPREX ) 1 g tablet Take 1 g by mouth 2 (two) times daily. Prophylactic 10/21/23   [provider]  methocarbamol  (ROBAXIN ) 500 MG tablet Take 500 mg by mouth every 6 (six) hours as needed for muscle spasms. 10/30/23   [provider]  metoprolol  tartrate (LOPRESSOR ) 25 MG tablet Take 1 tablet (25 mg total) by mouth 2 (two) times daily. 09/28/15   Tish Elsie FALCON, MD  Multiple Vitamins-Minerals (PRESERVISION AREDS 2) CAPS Take 1 capsule by mouth in the morning and at bedtime.    [provider]  nitrofurantoin , macrocrystal-monohydrate, (MACROBID ) 100 MG capsule Take 100 mg by mouth daily. Prophylaxis for UTI 10/28/23   [provider]  Nutritional Supplement LIQD Take 120 mLs by mouth daily at 6 PM.    [provider]  ondansetron  (ZOFRAN ) 4 MG tablet Take 4 mg by mouth every 6 (six) hours as needed for nausea or vomiting.     [provider]  phenazopyridine  (PYRIDIUM ) 100 MG tablet Take 1 tablet (100 mg total) by mouth 2 (two) times daily as needed for pain. Patient taking differently: Take 100 mg by mouth every 8 (eight) hours as needed (dysuria (painful urination)). 01/20/22   McKenzie, Belvie CROME, MD  Probiotic, Lactobacillus, CAPS Take 1  capsule by mouth in the morning, at noon, and at bedtime.    [provider]  simethicone  (MYLICON) 125 MG chewable  tablet Chew 125 mg by mouth every 6 (six) hours as needed for flatulence.    [provider]  ticagrelor  (BRILINTA ) 60 MG TABS tablet Take 60 mg by mouth daily.    [provider]  torsemide  (DEMADEX ) 20 MG tablet Take 20 mg by mouth daily. 08/31/23   [provider]  traMADol  (ULTRAM ) 50 MG tablet Take 1 tablet (50 mg total) by mouth every 8 (eight) hours as needed. Patient taking differently: Take 50 mg by mouth in the morning, at noon, and at bedtime. 07/03/22   Debby Fidela CROME, NP  traZODone  (DESYREL ) 100 MG tablet Take 100 mg by mouth at bedtime. 02/25/23   [provider]  vitamin B-12 (CYANOCOBALAMIN) 250 MCG tablet Take 250 mcg by mouth daily.    [provider]  vitamin E 1000 UNIT capsule Take 1,000 Units by mouth daily.    [provider]    Physical Exam: Vitals:   01/04/24 2130 01/04/24 2200 01/04/24 2253 01/04/24 2313  BP: 124/63 (!) 124/58 125/63   Pulse: 98 99 91   Resp: 18 18 18    Temp:  98.2 F (36.8 C) (!) 100.7 F (38.2 C)   TempSrc:   Oral   SpO2: 96% 96% 97%   Weight:    72.4 kg  Height:       Physical Exam Vitals reviewed.  Constitutional:      Appearance: She is normal weight.  HENT:     Head: Normocephalic.     Nose: Nose normal.     Mouth/Throat:     Mouth: Mucous membranes are moist.     Pharynx: Oropharynx is clear.  Eyes:     Conjunctiva/sclera: Conjunctivae normal.     Pupils: Pupils are equal, round, and reactive to light.  Cardiovascular:     Rate and Rhythm: Normal rate and regular rhythm.     Pulses: Normal pulses.     Heart sounds: Normal heart sounds.  Pulmonary:     Effort: Pulmonary effort is normal.     Breath sounds: Normal breath sounds.  Abdominal:     General: Abdomen is flat. Bowel sounds are normal.  Musculoskeletal:        General: Normal range of  motion.     Cervical back: Normal range of motion.  Skin:    General: Skin is warm.     Capillary Refill: Capillary refill takes less than 2 seconds.  Neurological:     General: No focal deficit present.     Mental Status: She is alert.  Psychiatric:        Mood and Affect: Mood normal.        Labs on Admission: I have personally reviewed the patients's labs and imaging studies.  Assessment/Plan Principal Problem:   Urinary tract infection   # Acute infection encephalopathy most secondary to urinary tract infection - Patient is on nitrofurantoin  - Last urine cultures were September 2024 with 4E faecalis with sensitivity to Cipro .  E. coli with resistance to Cipro .  Plan: Switch Cipro  to meropenem  due to E. coli resistance Follow-up blood cultures Follow-up urine cultures  # Generalized anxiety-continue Xanax   # Hypertension-continue amlodipine   # Chronic pain-continue gabapentin   # Rheumatoid arthritis-continue Arava   # Hypertension-continue metoprolol   # History of stroke-continue Brilinta   # Insomnia-continue trazodone     Admission status: Inpatient Med-Surg  Certification: The appropriate patient status for this patient is INPATIENT. Inpatient status is judged to be reasonable and necessary in order to provide the  required intensity of service to ensure the patient's safety. The patient's presenting symptoms, physical exam findings, and initial radiographic and laboratory data in the context of their chronic comorbidities is felt to place them at high risk for further clinical deterioration. Furthermore, it is not anticipated that the patient will be medically stable for discharge from the hospital within 2 midnights of admission.   * I certify that at the point of admission it is my clinical judgment that the patient will require inpatient hospital care spanning beyond 2 midnights from the point of admission due to high intensity of service, high risk for  further deterioration and high frequency of surveillance required.DEWAINE Lamar Dess MD [facility] If 7PM-7AM, please contact night-coverage [email or web address]  01/05/2024, 12:35 AM

## 2024-01-05 NOTE — Progress Notes (Signed)
 PROGRESS NOTE    Patient: Robin Blackburn                            PCP: Bertell Satterfield, MD                    DOB: 09-17-1945            DOA: 01/04/2024 FMW:[id number]             DOS: 01/05/2024, 3:07 PM   LOS: 1 day   Date of Service: The patient was seen and examined on 01/05/2024  Subjective:   The patient was seen and examined this morning. Awake alert oriented x 3 Borderline hypotensive, Tmax 101.1, otherwise hemodynamically stable this a.m.   Noted for drop in hemoglobin, no reported complaint of bleeding Denies any rectal bleed, or tarry black stool  Brief Narrative:   Robin Blackburn is a 78 y.o. female with medical history significant of prior stroke, depression, rheumatoid arthritis who presents emergency department due to headache and confusion.   Patient was at nursing home and was notably more confused.  She has had issues with recurrent urinary tract infections and was on Macrobid .  Due to mental status changes she was brought to the ER for further assessment.   ED: Was afebrile and hemodynamically stable.  Labs were obtained on presentation which showed WBC 19.0, hemoglobin 7.9, platelets 573, sodium 134, creatinine 1.96 baseline around 1, urinalysis positive for infection.  Due to extensive drug allergies patient was started on Cipro .  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   It was discussed with patient and she was unaware of any specific medications that she had allergies to.  She states she has not had anaphylaxis to any antibiotics that she is aware of.  She was alert and oriented and able to converse.  She is complaining by nursing home and wants to move to a different one.   Assessment/Plan  Urinary tract infection Anemia of chronic disease with iron deficiency- symptomatic anemia Acute on chronic kidney disease Hypertension Acute on chronic severe  debility     Acute infection encephalopathy most secondary to urinary tract infection - Patient was  on nitrofurantoin  - Last urine cultures were September 2024 with E. coli/Enterococcus faecalis with sensitivity to Cipro .  And E. coli resistance to Cipro .   Switch Cipro  to>> Meropenem    due to E. coli resistance UA: Moderate hemoglobin, leukocyte Estrace , nitrites, many bacteria, WBC >50 Follow-up blood cultures Follow-up urine cultures   Acute on chronic anemia-due to anemia of chronic disease and sever iron deficiency - Drop in hemoglobin noted, hypotensive, -Holding Brilinta , DVT prophylaxis of Lovenox  - Proceed with 2U PRBC blood transfusion - Obtaining anemia workup including iron studies, Hemoccult     Latest Ref Rng & Units 01/05/2024    8:03 AM 01/05/2024    4:34 AM 01/04/2024    7:35 PM  CBC  WBC 4.0 - 10.5 K/uL  16.1  19.0   Hemoglobin 12.0 - 15.0 g/dL 6.3  6.7  7.9   Hematocrit 36.0 - 46.0 % 20.9  21.7  26.4   Platelets 150 - 400 K/uL  464  573    Per patient and POA; had a recent blood transfusion approximate 2 weeks ago Iron/TIBC/Ferritin/ %Sat    Component Value Date/Time   IRON 10 (L) 01/05/2024 9196  TIBC 152 (L) 01/05/2024 0803   FERRITIN 125 12/23/2023 1355   IRONPCTSAT 7 (L) 01/05/2024 0803   Withholding IV iron due to active infection--will continue with supplement May need IV iron before discharge   Generalized anxiety-continue Xanax    Hypertension-hypotensive, holding amlodipine , reducing metoprolol  dose   Chronic pain-continue gabapentin -duloxetine dose due to hypotension   Rheumatoid arthritis-continue Arava    History of stroke-continue Brilinta  (holding due acute drop in hemoglobin), continuing statins  Insomnia-continue trazodone   Chronic severe debility -bedbound-once stable reevaluated by PT  OT   ------------------------------------------------------------------------------------------------------------------------------------------ Nutritional status:  The patient's BMI is: Body mass index is 25.76 kg/m. I agree with the assessment and plan as outlined ------------------------------------------------------------------------------------------------------------------------------------------- Cultures; Blood Cultures x 2 >> Urine Culture  >>>  ------------------------------------------------------------------------------------------------------------------------------------------  DVT prophylaxis:  Place and maintain sequential compression device Start: 01/05/24 0750 SCDs Start: 01/05/24 0031   Code Status:   Code Status: Full Code  Family Communication: POA present bedside updated -Advance care planning has been discussed.   Admission status:   Status is: Inpatient Remains inpatient appropriate because: IV antibiotics   Disposition: From  - home             Planning for discharge in 1-2 days   Procedures:   No admission procedures for hospital encounter.   Antimicrobials:  Anti-infectives (From admission, onward)    Start     Dose/Rate Route Frequency Ordered Stop   01/06/24 1000  methenamine  (MANDELAMINE) tablet 1,000 mg        1,000 mg Oral 4 times daily 01/05/24 0054     01/05/24 0200  meropenem  (MERREM ) 1 g in sodium chloride  0.9 % 100 mL IVPB        1 g 200 mL/hr over 30 Minutes Intravenous Every 12 hours 01/05/24 0045     01/05/24 0130  methenamine  (HIPREX ) tablet 1 g  Status:  Discontinued       Note to Pharmacy: Prophylactic     1 g Oral 2 times daily 01/05/24 0034 01/05/24 0054   01/04/24 2130  ciprofloxacin  (CIPRO ) IVPB 400 mg        400 mg 200 mL/hr over 60 Minutes Intravenous  Once 01/04/24 2128 01/04/24 2237        Medication:   ALPRAZolam   0.5 mg Oral BID   gabapentin   100 mg Oral TID   leflunomide   10 mg Oral Daily   [START ON  01/06/2024] methenamine   1,000 mg Oral QID   metoprolol  tartrate  12.5 mg Oral BID   [START ON 01/06/2024] ticagrelor   60 mg Oral Daily   [START ON 01/06/2024] torsemide   20 mg Oral Daily   traZODone   100 mg Oral QHS    acetaminophen , methocarbamol , ondansetron  **OR** ondansetron  (ZOFRAN ) IV, traMADol    Objective:   Vitals:   01/05/24 1021 01/05/24 1042 01/05/24 1357 01/05/24 1430  BP: (!) 104/52 (!) 101/54 101/83 (!) 107/58  Pulse: 92 88 89 78  Resp:  16  18  Temp: 98.9 F (37.2 C) 98.8 F (37.1 C) 98.2 F (36.8 C) 98.8 F (37.1 C)  TempSrc: Oral  Oral   SpO2: 93%  93%   Weight:      Height:        Intake/Output Summary (Last 24 hours) at 01/05/2024 1507 Last data filed at 01/05/2024 1400 Gross per 24 hour  Intake 1726 ml  Output 400 ml  Net 1326 ml   Filed Weights   01/04/24 1836 01/04/24 2313  Weight: 68.9 kg 72.4 kg  Physical examination:   General:  AAO x 3,  cooperative, no distress;   HEENT:  Normocephalic, PERRL, otherwise with in Normal limits   Neuro:  CNII-XII intact. , normal motor and sensation, reflexes intact   Lungs:   Clear to auscultation BL, Respirations unlabored,  No wheezes / crackles  Cardio:    S1/S2, RRR, No murmure, No Rubs or Gallops   Abdomen:  Soft, non-tender, bowel sounds active all four quadrants, no guarding or peritoneal signs.  Muscular  skeletal:  Limited exam -sever global generalized weaknesses Bedbound - - in bed, able to move all 4 extremities,   2+ pulses,  symmetric, No pitting edema  Skin:  Dry, warm to touch, negative for any Rashes,  Wounds: Please see nursing documentation       ------------------------------------------------------------------------------------------------------------------------------------------    LABs:     Latest Ref Rng & Units 01/05/2024    8:03 AM 01/05/2024    4:34 AM 01/04/2024    7:35 PM  CBC  WBC 4.0 - 10.5 K/uL  16.1  19.0   Hemoglobin 12.0 - 15.0 g/dL 6.3  6.7  7.9    Hematocrit 36.0 - 46.0 % 20.9  21.7  26.4   Platelets 150 - 400 K/uL  464  573       Latest Ref Rng & Units 01/05/2024    4:34 AM 01/04/2024    7:35 PM 12/23/2023   12:21 PM  CMP  Glucose 70 - 99 mg/dL 881  861  886   BUN 8 - 23 mg/dL 24  25  20    Creatinine 0.44 - 1.00 mg/dL 8.26  8.03  8.53   Sodium 135 - 145 mmol/L 134  134  135   Potassium 3.5 - 5.1 mmol/L 4.4  4.9  4.3   Chloride 98 - 111 mmol/L 105  103  100   CO2 22 - 32 mmol/L 20  20  23    Calcium  8.9 - 10.3 mg/dL 7.7  8.3  8.3   Total Protein 6.5 - 8.1 g/dL 5.8  6.8  6.7   Total Bilirubin 0.0 - 1.2 mg/dL 1.3  1.2  0.6   Alkaline Phos 38 - 126 U/L 141  165  115   AST 15 - 41 U/L 11  13  16    ALT 0 - 44 U/L 9  9  10         Micro Results No results found for this or any previous visit (from the past 240 hours).  Radiology Reports CT Head Wo Contrast Result Date: 01/04/2024 CLINICAL DATA:  Mental status change, unknown cause EXAM: CT HEAD WITHOUT CONTRAST TECHNIQUE: Contiguous axial images were obtained from the base of the skull through the vertex without intravenous contrast. RADIATION DOSE REDUCTION: This exam was performed according to the departmental dose-optimization program which includes automated exposure control, adjustment of the mA and/or kV according to patient size and/or use of iterative reconstruction technique. COMPARISON:  Nov 05, 2023 FINDINGS: Brain: Proportional prominence of the ventricles and sulci, consistent with diffuse cerebral parenchymal volume loss. The ventricles otherwise maintained midline position without midline shift. Gray-white differentiation is preserved.Periventricular and subcortical white matter hypoattenuation, most consistent with changes of moderate chronic ischemic microvascular disease.No evidence of acute territorial infarction, extra-axial fluid collection, hemorrhage, or mass lesion. 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 The basilar cisterns are patent without  downward herniation. The cerebellar hemispheres and vermis are well formed without mass lesion or focal attenuation abnormality. Vascular: No hyperdense vessel.  Skull: Normal. Negative for fracture or focal lesion. Sinuses/Orbits: The paranasal sinuses and mastoids are clear.The globes appear intact. No retrobulbar hematoma. Other: None. IMPRESSION: 1. No acute intracranial abnormality, specifically, no acute hemorrhage, territorial infarction, or intracranial mass. 2. Global cerebral volume loss with sequelae of chronic ischemic microvascular disease. Electronically Signed   By: Rogelia Myers M.D.   On: 01/04/2024 21:17   DG Chest Port 1 View Result Date: 01/04/2024 CLINICAL DATA:  sob EXAM: PORTABLE CHEST - 1 VIEW COMPARISON:  August 20, 2018 FINDINGS: No focal airspace consolidation, pleural effusion, or pneumothorax. Moderate cardiomegaly. Tortuous aorta with aortic atherosclerosis. No acute fracture or destructive lesions. Multilevel thoracic osteophytosis. IMPRESSION: No acute cardiopulmonary abnormality. Electronically Signed   By: Rogelia Myers M.D.   On: 01/04/2024 19:59    SIGNED: Adriana DELENA Grams, MD, FHM. FAAFP. Jolynn Pack - [facility] Time spent - 55 min.  In seeing, evaluating and examining the patient. Reviewing medical records, labs, drawn plan of care. [facility],  Pager (please use [email or web address] to page/ text) Please use Epic Secure Chat for non-urgent communication (7AM-7PM)  If 7PM-7AM, please contact night-coverage [email or web address], 01/05/2024, 3:07 PM

## 2024-01-05 NOTE — TOC Initial Note (Signed)
 Transition of Care [facility]) - Initial/Assessment Note    Patient Details  Name: Robin Blackburn MRN: [id number] Date of Birth: 18-Aug-1945  Transition of Care [facility] CM/SW Contact:    Sharlyne Stabs, RN Phone Number: 01/05/2024, 10:45 AM  Clinical Narrative:       Patient admitted with urinary infection, from [facility]. CM at the bedside, with patient and POA, per patient. They are requesting TOC to send out FL2 for another bed offers.  PT eval pending.  FL2 completed and sent out to their 4 choices TOC following.               Barriers to Discharge: Continued Medical Work up   Patient Goals and CMS Choice Patient states their goals for this hospitalization and ongoing recovery are:: Wants another facility CMS Medicare.gov Compare Post Acute Care list provided to:: Patient Choice offered to / list presented to : Patient [place] ownership interest in [facility].provided to:: Patient    Expected Discharge Plan and Services       Living arrangements for the past 2 months: Skilled Nursing Facility                     Prior Living Arrangements/Services Living arrangements for the past 2 months: Skilled Nursing Facility Lives with:: Facility Resident Patient language and need for interpreter reviewed:: Yes Do you feel safe going back to the place where you live?: Yes      Need for Family Participation in Patient Care: Yes (Comment) Care giver support system in place?: Yes (comment) Current home services: DME Criminal Activity/Legal Involvement Pertinent to Current Situation/Hospitalization: No - Comment as needed  Activities of Daily Living      Permission Sought/Granted     Emotional Assessment   Attitude/Demeanor/Rapport: Engaged Affect (typically observed): Accepting Orientation: : Oriented to Place, Oriented to Situation, Oriented to  Time, Oriented to Self Alcohol  / Substance Use: Not Applicable Psych Involvement: No (comment)  Admission diagnosis:   Urinary tract infection [N39.0] Acute cystitis with hematuria [N30.01] AKI (acute kidney injury) (HCC) [N17.9] Patient Active Problem List   Diagnosis Date Noted   Urinary tract infection 01/04/2024   Pulmonary nodules 01/27/2023   Bilateral hearing loss 07/01/2022   Chronic ear pain, bilateral 07/01/2022   Spinal stenosis of lumbar region 11/22/2021   OAB (overactive bladder) 05/21/2020   Osteoarthritis of right knee 06/01/2019   Weakness    Recurrent UTI 11/25/2016   Benzodiazepine withdrawal with perceptual disturbance (HCC) 04/02/2016   Osteoporosis 11/01/2015   Vitamin D  deficiency 10/11/2015   History of fracture of left ankle 09/28/2015   Chronic pain in left foot 09/28/2015   Fibromyalgia syndrome 09/28/2015   GERD (gastroesophageal reflux disease) 04/05/2015   Diarrhea 01/05/2015   Chronic pain syndrome 12/02/2014   Back pain, lumbosacral 06/23/2014   Sinusitis, chronic 06/17/2014   H/O: CVA (cerebrovascular accident) 05/24/2014   HLD (hyperlipidemia) 05/24/2014   Physical deconditioning 05/24/2014   Right sided weakness 02/26/2014   Rheumatoid arthritis (HCC) 02/20/2014   Essential hypertension 02/19/2014   Neuropathy 02/19/2014   Anxiety 02/19/2014   PCP:  Bertell Satterfield, MD Pharmacy:   [facility] - [place], [place] - [address] [place] [address] Phone: [phone] Fax: [phone]     Social Drivers of Health (SDOH) Social History: SDOH Screenings   Food Insecurity: No Food Insecurity (01/04/2024)  Housing: Low Risk  (01/04/2024)  Transportation Needs: No Transportation Needs (01/04/2024)  Utilities: Not At Risk (01/04/2024)  Alcohol  Screen: Low Risk  (01/06/2022)  Depression (PHQ2-9): Low Risk  (12/15/2023)  Recent Concern: Depression (PHQ2-9) - Medium Risk (10/13/2023)  Financial Resource Strain: Low Risk  (01/06/2022)  Physical Activity: Inactive (11/07/2022)   Received from [facility]  Social  Connections: Socially Isolated (01/04/2024)  Stress: No Stress Concern Present (01/06/2022)  Tobacco Use: Low Risk  (01/04/2024)   SDOH Interventions:     Readmission Risk Interventions    01/05/2024   10:44 AM  Readmission Risk Prevention Plan  Transportation Screening Complete  PCP or Specialist Appt within 5-7 Days Not Complete  Home Care Screening Complete  Medication Review (RN CM) Complete

## 2024-01-05 NOTE — Progress Notes (Signed)
 Patient was complaining of neck pain, upon assessment of neck I noticed and odorous drainage on the pillow, Angel RN helped me to turn and assess neck, it looks to be blisters that have popped and drained. Patient states she has been getting lidocaine  patches for neck pain at her facility and has since noticed a burning pain in addition to her normal level of pain.

## 2024-01-05 NOTE — Progress Notes (Signed)
 Pt has a fever of 101.1 at 0133. Pharmacy reached out for concern of pt being allergic to tylenol , pt states she is not allergic and takes it all the time. New orders were obtained for tylenol , given at 0155. Temp recheck at 0252, 100.3.

## 2024-01-05 NOTE — Progress Notes (Signed)
   01/05/24 0554  Provider Notification  Provider Name/Title Lamar Dess  Date Provider Notified 01/05/24  Time Provider Notified [phone]  Method of Notification  (Secure Chat)  Notification Reason Critical Result  Test performed and critical result Lab (Hemoglobin 6.7)  Date Critical Result Received 01/05/24  Time Critical Result Received 0545  Provider response No new orders  Date of Provider Response 01/05/24  Time of Provider Response  (No response but saw the message.)

## 2024-01-05 NOTE — Care Management (Addendum)
 Pt states she has been at [facility] the past 5 years. Pt and her POA/caregiver state that she has feelings of being neglected/mistreated, and says that when she leaves, she would not like to return to [facility].

## 2024-01-05 NOTE — Plan of Care (Signed)

## 2024-01-05 NOTE — [place] FL2 (Signed)
 [place]  MEDICAID FL2 LEVEL OF CARE FORM     IDENTIFICATION  Patient Name: Robin Blackburn Birthdate: August 17, 1945 Sex: female Admission Date (Current Location): 01/04/2024  [facility] and [place] Number:  [employer] and Address:  [facility],  [address], [place] [address]      Provider Number: [id number]  Attending Physician Name and Address:  Willette Adriana DELENA, MD  Relative Name and Phone Number:  Dozier Jenny Pricilla)  [phone]    Current Level of Care: Hospital Recommended Level of Care: Skilled Nursing Facility Prior Approval Number:    Date Approved/Denied:   PASRR Number: [id number]  Discharge Plan: SNF    Current Diagnoses: Patient Active Problem List   Diagnosis Date Noted   Urinary tract infection 01/04/2024   Pulmonary nodules 01/27/2023   Bilateral hearing loss 07/01/2022   Chronic ear pain, bilateral 07/01/2022   Spinal stenosis of lumbar region 11/22/2021   OAB (overactive bladder) 05/21/2020   Osteoarthritis of right knee 06/01/2019   Weakness    Recurrent UTI 11/25/2016   Benzodiazepine withdrawal with perceptual disturbance (HCC) 04/02/2016   Osteoporosis 11/01/2015   Vitamin D  deficiency 10/11/2015   History of fracture of left ankle 09/28/2015   Chronic pain in left foot 09/28/2015   Fibromyalgia syndrome 09/28/2015   GERD (gastroesophageal reflux disease) 04/05/2015   Diarrhea 01/05/2015   Chronic pain syndrome 12/02/2014   Back pain, lumbosacral 06/23/2014   Sinusitis, chronic 06/17/2014   H/O: CVA (cerebrovascular accident) 05/24/2014   HLD (hyperlipidemia) 05/24/2014   Physical deconditioning 05/24/2014   Right sided weakness 02/26/2014   Rheumatoid arthritis (HCC) 02/20/2014   Essential hypertension 02/19/2014   Neuropathy 02/19/2014   Anxiety 02/19/2014    Orientation RESPIRATION BLADDER Height & Weight     Time, Situation, Place, Self  Normal Continent Weight: 72.4 kg Height:  5' 6 (167.6  cm)  BEHAVIORAL SYMPTOMS/MOOD NEUROLOGICAL BOWEL NUTRITION STATUS      Continent Diet (see DC Summary)  AMBULATORY STATUS COMMUNICATION OF NEEDS Skin     Verbally Normal                       Personal Care Assistance Level of Assistance  Bathing, Feeding, Dressing Bathing Assistance: Maximum assistance Feeding assistance: Limited assistance Dressing Assistance: Maximum assistance     Functional Limitations Info  Sight, Hearing, Speech Sight Info: Adequate Hearing Info: Adequate Speech Info: Adequate    SPECIAL CARE FACTORS FREQUENCY  PT (By licensed PT)     PT Frequency: 5 times a week              Contractures Contractures Info: Not present    Additional Factors Info  Code Status, Allergies Code Status Info: FULL Allergies Info: Cortisone  Fentanyl   Hydromorphone   Iodinated Contrast Media  Keflex  (Cephalexin )  Meperidine  Morphine  Oxycodone   Oxycodone -acetaminophen   Penicillins  Afluria Preservative Free (Influenza Virus Vacc Split Pf)  Aspirin   Ciprofloxacin  Hcl  Codeine  Influenza Vaccines  Influenza Virus Vaccine  Iohexol  Meperidine Hcl  Misc. Sulfonamide Containing Compounds  Oxycodone  Hcl  Penicillamine  Shellfish Allergy  Sulfa  Antibiotics  Troleandomycin  Bisphosphonates  Ciprofloxacin   Iodine  Levaquin (Levofloxacin In D5w)  Levofloxacin  Morphine And Codeine  Oxytetracycline  Percocet (Oxycodone -acetaminophen )  Sulfur           Current Medications (01/05/2024):  This is the current hospital active medication list Current Facility-Administered Medications  Medication Dose Route Frequency Provider Last Rate Last Admin  acetaminophen  (TYLENOL ) tablet 650 mg  650 mg Oral Q6H PRN Dena Charleston, MD   650 mg at 01/05/24 0155   ALPRAZolam  (XANAX ) tablet 0.5 mg  0.5 mg Oral BID Dena Charleston, MD   0.5 mg at 01/05/24 9053   gabapentin  (NEURONTIN ) capsule 100 mg  100 mg Oral TID Willette Jest A, MD   100 mg at 01/05/24 0946   leflunomide  (ARAVA ) tablet 10  mg  10 mg Oral Daily Dorrell, Robert, MD   10 mg at 01/05/24 9041   meropenem  (MERREM ) 1 g in sodium chloride  0.9 % 100 mL IVPB  1 g Intravenous Q12H Dorrell, Robert, MD   Stopped at 01/05/24 0154   [START ON 01/06/2024] methenamine  (MANDELAMINE) tablet 1,000 mg  1,000 mg Oral QID Dorrell, Robert, MD       methocarbamol  (ROBAXIN ) tablet 500 mg  500 mg Oral Q6H PRN Dena Charleston, MD       metoprolol  tartrate (LOPRESSOR ) tablet 12.5 mg  12.5 mg Oral BID Shahmehdi, Seyed A, MD   12.5 mg at 01/05/24 0945   ondansetron  (ZOFRAN ) tablet 4 mg  4 mg Oral Q6H PRN Dena Charleston, MD       Or   ondansetron  (ZOFRAN ) injection 4 mg  4 mg Intravenous Q6H PRN Dena Charleston, MD       NOREEN ON 01/06/2024] ticagrelor  (BRILINTA ) tablet 60 mg  60 mg Oral Daily Shahmehdi, Seyed A, MD       [START ON 01/06/2024] torsemide  (DEMADEX ) tablet 20 mg  20 mg Oral Daily Shahmehdi, Seyed A, MD       traMADol  (ULTRAM ) tablet 50 mg  50 mg Oral Q12H PRN Dena Charleston, MD   50 mg at 01/05/24 0157   traZODone  (DESYREL ) tablet 100 mg  100 mg Oral QHS Dorrell, Robert, MD   100 mg at 01/05/24 0155     Discharge Medications: Please see discharge summary for a list of discharge medications.  Relevant Imaging Results:  Relevant Lab Results:   Additional Information SS# [id number]  Sharlyne Stabs, RN

## 2024-01-06 ENCOUNTER — Inpatient Hospital Stay: Admitting: Oncology

## 2024-01-06 ENCOUNTER — Inpatient Hospital Stay (HOSPITAL_COMMUNITY)

## 2024-01-06 ENCOUNTER — Inpatient Hospital Stay

## 2024-01-06 DIAGNOSIS — N3001 Acute cystitis with hematuria: Secondary | ICD-10-CM | POA: Diagnosis not present

## 2024-01-06 LAB — BASIC METABOLIC PANEL WITH GFR
Anion gap: 6 (ref 5–15)
BUN: 23 mg/dL (ref 8–23)
CO2: 21 mmol/L — ABNORMAL LOW (ref 22–32)
Calcium: 7.9 mg/dL — ABNORMAL LOW (ref 8.9–10.3)
Chloride: 106 mmol/L (ref 98–111)
Creatinine, Ser: 1.44 mg/dL — ABNORMAL HIGH (ref 0.44–1.00)
GFR, Estimated: 37 mL/min — ABNORMAL LOW (ref >60)
Glucose, Bld: 110 mg/dL — ABNORMAL HIGH (ref 70–99)
Potassium: 4.2 mmol/L (ref 3.5–5.1)
Sodium: 133 mmol/L — ABNORMAL LOW (ref 135–145)

## 2024-01-06 LAB — CBC WITH DIFFERENTIAL/PLATELET
Abs Immature Granulocytes: 0.26 K/uL — ABNORMAL HIGH (ref 0.00–0.07)
Basophils Absolute: 0.1 K/uL (ref 0.0–0.1)
Basophils Relative: 1 %
Eosinophils Absolute: 0.4 K/uL (ref 0.0–0.5)
Eosinophils Relative: 3 %
HCT: 28.6 % — ABNORMAL LOW (ref 36.0–46.0)
Hemoglobin: 9.1 g/dL — ABNORMAL LOW (ref 12.0–15.0)
Immature Granulocytes: 2 %
Lymphocytes Relative: 13 %
Lymphs Abs: 1.8 K/uL (ref 0.7–4.0)
MCH: 30.5 pg (ref 26.0–34.0)
MCHC: 31.8 g/dL (ref 30.0–36.0)
MCV: 96 fL (ref 80.0–100.0)
Monocytes Absolute: 1.3 K/uL — ABNORMAL HIGH (ref 0.1–1.0)
Monocytes Relative: 9 %
Neutro Abs: 9.7 K/uL — ABNORMAL HIGH (ref 1.7–7.7)
Neutrophils Relative %: 72 %
Platelets: 407 K/uL — ABNORMAL HIGH (ref 150–400)
RBC: 2.98 MIL/uL — ABNORMAL LOW (ref 3.87–5.11)
RDW: 15.4 % (ref 11.5–15.5)
WBC: 13.6 K/uL — ABNORMAL HIGH (ref 4.0–10.5)
nRBC: 0 % (ref 0.0–0.2)

## 2024-01-06 LAB — BPAM RBC
Blood Product Expiration Date: 202508172359
Blood Product Expiration Date: 202508202359
ISSUE DATE / TIME: 202507291022
ISSUE DATE / TIME: 202507291407
Unit Type and Rh: 5100
Unit Type and Rh: 5100

## 2024-01-06 LAB — TYPE AND SCREEN
ABO/RH(D): O POS
Antibody Screen: NEGATIVE
Unit division: 0
Unit division: 0

## 2024-01-06 MED ORDER — HYDROMORPHONE HCL 2 MG PO TABS
2.0000 mg | ORAL_TABLET | ORAL | Status: DC | PRN
  Administered 2024-01-06 – 2024-01-09 (×9): 2 mg via ORAL
  Filled 2024-01-06 (×9): qty 1

## 2024-01-06 MED ORDER — ALPRAZOLAM 0.5 MG PO TABS
0.5000 mg | ORAL_TABLET | Freq: Three times a day (TID) | ORAL | Status: DC | PRN
  Administered 2024-01-06 – 2024-01-09 (×8): 0.5 mg via ORAL
  Filled 2024-01-06 (×8): qty 1

## 2024-01-06 MED ORDER — ONDANSETRON HCL 4 MG/2ML IJ SOLN
4.0000 mg | Freq: Four times a day (QID) | INTRAMUSCULAR | Status: DC | PRN
  Administered 2024-01-06 – 2024-01-09 (×6): 4 mg via INTRAVENOUS
  Filled 2024-01-06 (×6): qty 2

## 2024-01-06 MED ORDER — ORAL CARE MOUTH RINSE
15.0000 mL | OROMUCOSAL | Status: DC | PRN
Start: 2024-01-06 — End: 2024-01-09

## 2024-01-06 MED ORDER — PROCHLORPERAZINE EDISYLATE 10 MG/2ML IJ SOLN
10.0000 mg | INTRAMUSCULAR | Status: DC | PRN
  Administered 2024-01-06: 10 mg via INTRAVENOUS
  Filled 2024-01-06: qty 2

## 2024-01-06 NOTE — Progress Notes (Signed)
 PROGRESS NOTE   Robin Blackburn  FMW:[id number] DOB: 1945/09/13 DOA: 01/04/2024 PCP: Bertell Satterfield, MD   Chief Complaint  Patient presents with   Altered Mental Status   Level of care: Med-Surg  Brief Admission History:  78 y.o. female with medical history significant of prior stroke, depression, rheumatoid arthritis who presents emergency department due to headache and confusion.  Patient was at nursing home and was notably more confused.  She has had issues with recurrent urinary tract infections and was on Macrobid .  Due to mental status changes she was brought to the ER for further assessment.   ED: Was afebrile and hemodynamically stable.  Labs were obtained on presentation which showed WBC 19.0, hemoglobin 7.9, platelets 573, sodium 134, creatinine 1.96 baseline around 1, urinalysis positive for infection.  Due to extensive drug allergies patient was started on Cipro .  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  It was discussed with patient and she was unaware of any specific medications that she had allergies to.  She states she has not had anaphylaxis to any antibiotics that she is aware of.  She was alert and oriented and able to converse.  She is complaining by nursing home and wants to move to a different one.    Assessment and Plan:  Acute infection encephalopathy most secondary to urinary tract infection - Patient was on nitrofurantoin  prior to arrival - Last urine cultures were September 2024 with E. coli/Enterococcus faecalis with sensitivity to Cipro .  And E. coli resistance to Cipro .   Switched Cipro  to>> Meropenem  due to E. coli resistance UA: Moderate hemoglobin, leukocyte Estrace , nitrites, many bacteria, WBC >50 Follow-up blood cultures - NGTD Follow-up urine cultures - Gram neg rods reported; awaiting ID and sensitivities    Acute on chronic anemia-due to anemia of  chronic disease and severe iron deficiency - Drop in hemoglobin noted---requiring transfusion 2 units PRBC on 7/29 - Held Brilinta , DVT prophylaxis with Lovenox  - Pt tolerated 2U PRBC blood transfusion - iron studies consistent with iron deficiency, Hemoccult testing negative        Latest Ref Rng & Units 01/05/2024    8:03 AM 01/05/2024    4:34 AM 01/04/2024    7:35 PM  CBC  WBC 4.0 - 10.5 K/uL   16.1  19.0   Hemoglobin 12.0 - 15.0 g/dL 6.3  6.7  7.9   Hematocrit 36.0 - 46.0 % 20.9  21.7  26.4   Platelets 150 - 400 K/uL   464  573     Per patient and POA; had a recent blood transfusion approximate 2 weeks ago Iron/TIBC/Ferritin/ %Sat Labs (Brief)          Component Value Date/Time    IRON 10 (L) 01/05/2024 0803    TIBC 152 (L) 01/05/2024 0803    FERRITIN 125 12/23/2023 1355    IRONPCTSAT 7 (L) 01/05/2024 0803      Withholding IV iron due to active infection--will continue with supplement May need IV iron before discharge - did receive iron load with 2 units PRBC transfused    Generalized anxiety -continue Xanax    Hypertension -hypotensive, holding amlodipine , reducing metoprolol  dose   Chronic pain Chronic Back Pain -continue gabapentin  -pt complains of severe back pain since Fall in May 2025 -pt requesting imaging of back due to ongoing severe pain; no cauda equina symptoms -MRI ordered of spine    Rheumatoid arthritis -continue Arava   History of stroke -continue Brilinta ;  continuing statins   Insomnia -continue trazodone    Chronic severe debility -bedbound-once stable reevaluated by PT OT  DVT prophylaxis: SCDs Code Status: Full  Communication: POA at bedside updated  Disposition: return to [facility]:  PT/OT Procedures:   Antimicrobials:    Subjective:SABRA Pt complains of ongoing severe back pain in T/L spine and cervical spine since fall in May 2025 at her SNF.  Pt wanting some imaging found.    Objective: Vitals:   01/05/24  1430 01/05/24 1945 01/05/24 2230 01/06/24 0630  BP: (!) 107/58 (!) 112/57 (!) 125/54 129/66  Pulse: 78 88 78 88  Resp: 18     Temp: 98.8 F (37.1 C) (!) 97.4 F (36.3 C)  98.1 F (36.7 C)  TempSrc:  Oral  Oral  SpO2:  98%  94%  Weight:      Height:        Intake/Output Summary (Last 24 hours) at 01/06/2024 1254 Last data filed at 01/06/2024 1100 Gross per 24 hour  Intake 1346 ml  Output 700 ml  Net 646 ml   Filed Weights   01/04/24 1836 01/04/24 2313  Weight: 68.9 kg 72.4 kg   Examination:  General exam: Appears calm and comfortable  Respiratory system: Clear to auscultation. Respiratory effort normal. Cardiovascular system: normal S1 & S2 heard. No JVD, murmurs, rubs, gallops or clicks. No pedal edema. Gastrointestinal system: Abdomen is nondistended, soft and nontender. No organomegaly or masses felt. Normal bowel sounds heard. Central nervous system: Alert and oriented. No focal neurological deficits. Extremities: Symmetric 5 x 5 power. Skin: No rashes, lesions or ulcers. Psychiatry: Judgement and insight appear normal. Mood & affect appropriate.   Data Reviewed: I have personally reviewed following labs and imaging studies  CBC: Recent Labs  Lab 01/04/24 1935 01/05/24 0434 01/05/24 0803 01/05/24 1848 01/06/24 0810  WBC 19.0* 16.1*  --   --  13.6*  NEUTROABS 15.7*  --   --   --  9.7*  HGB 7.9* 6.7* 6.3* 10.2* 9.1*  HCT 26.4* 21.7* 20.9* 32.7* 28.6*  MCV 101.1* 101.4*  --   --  96.0  PLT 573* 464*  --   --  407*    Basic Metabolic Panel: Recent Labs  Lab 01/04/24 1935 01/05/24 0434 01/06/24 0810  NA 134* 134* 133*  K 4.9 4.4 4.2  CL 103 105 106  CO2 20* 20* 21*  GLUCOSE 138* 118* 110*  BUN 25* 24* 23  CREATININE 1.96* 1.73* 1.44*  CALCIUM  8.3* 7.7* 7.9*    CBG: No results for input(s): GLUCAP in the last 168 hours.  Recent Results (from the past 240 hours)  Urine Culture     Status: Abnormal (Preliminary result)   Collection Time: 01/04/24   8:17 PM   Specimen: Urine, Catheterized  Result Value Ref Range Status   Specimen Description   Final    URINE, CATHETERIZED Performed at [facility], [address] [place], [place] [address]    Special Requests   Final    NONE Performed at [facility], [address], [place], [place] [address]    Culture (A)  Final    70,000 COLONIES/mL KLEBSIELLA PNEUMONIAE SUSCEPTIBILITIES TO FOLLOW Performed at [facility], [address] [place], [place] [address]    Report Status PENDING  Incomplete     Radiology Studies: CT Head Wo Contrast Result Date: 01/04/2024 CLINICAL DATA:  Mental status change, unknown cause EXAM: CT HEAD  WITHOUT CONTRAST TECHNIQUE: Contiguous axial images were obtained from the base of the skull through the vertex without intravenous contrast. RADIATION DOSE REDUCTION: This exam was performed according to the departmental dose-optimization program which includes automated exposure control, adjustment of the mA and/or kV according to patient size and/or use of iterative reconstruction technique. COMPARISON:  Nov 05, 2023 FINDINGS: Brain: Proportional prominence of the ventricles and sulci, consistent with diffuse cerebral parenchymal volume loss. The ventricles otherwise maintained midline position without midline shift. Gray-white differentiation is preserved.Periventricular and subcortical white matter hypoattenuation, most consistent with changes of moderate chronic ischemic microvascular disease.No evidence of acute territorial infarction, extra-axial fluid collection, hemorrhage, or mass lesion. 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 The basilar cisterns are patent without downward herniation. The cerebellar hemispheres and vermis are well formed without mass lesion or focal attenuation abnormality. Vascular: No hyperdense vessel. Skull: Normal. Negative for fracture or focal lesion. Sinuses/Orbits: The paranasal sinuses and mastoids are clear.The  globes appear intact. No retrobulbar hematoma. Other: None. IMPRESSION: 1. No acute intracranial abnormality, specifically, no acute hemorrhage, territorial infarction, or intracranial mass. 2. Global cerebral volume loss with sequelae of chronic ischemic microvascular disease. Electronically Signed   By: Rogelia Myers M.D.   On: 01/04/2024 21:17   DG Chest Port 1 View Result Date: 01/04/2024 CLINICAL DATA:  sob EXAM: PORTABLE CHEST - 1 VIEW COMPARISON:  August 20, 2018 FINDINGS: No focal airspace consolidation, pleural effusion, or pneumothorax. Moderate cardiomegaly. Tortuous aorta with aortic atherosclerosis. No acute fracture or destructive lesions. Multilevel thoracic osteophytosis. IMPRESSION: No acute cardiopulmonary abnormality. Electronically Signed   By: Rogelia Myers M.D.   On: 01/04/2024 19:59    Scheduled Meds:  gabapentin   100 mg Oral TID   leflunomide   10 mg Oral Daily   methenamine   1,000 mg Oral QID   metoprolol  tartrate  12.5 mg Oral BID   ticagrelor   60 mg Oral Daily   torsemide   20 mg Oral Daily   traZODone   100 mg Oral QHS   Continuous Infusions:  meropenem  (MERREM ) IV Stopped (01/06/24 0240)     LOS: 2 days   Time spent: 59 mins  Massiel Stipp Vicci, MD How to contact the [facility] Attending or Consulting provider 7A - 7P or covering provider during after hours 7P -7A, for this patient?  Check the care team in [facility] and look for a) attending/consulting TRH provider listed and b) the TRH team listed Log into [email or web address] to find provider on call.  Locate the TRH provider you are looking for under [facility] and page to a number that you can be directly reached. If you still have difficulty reaching the provider, please page the [facility] (Director on Call) for the Hospitalists listed on amion for assistance.  01/06/2024, 12:54 PM

## 2024-01-06 NOTE — Plan of Care (Signed)
  Problem: Education: Goal: Knowledge of General Education information will improve Description: Including pain rating scale, medication(s)/side effects and non-pharmacologic comfort measures Outcome: Progressing   Problem: Clinical Measurements: Goal: Cardiovascular complication will be avoided Outcome: Progressing   Problem: Activity: Goal: Risk for activity intolerance will decrease Outcome: Progressing   Problem: Nutrition: Goal: Adequate nutrition will be maintained Outcome: Progressing   Problem: Coping: Goal: Level of anxiety will decrease Outcome: Progressing   

## 2024-01-06 NOTE — Progress Notes (Signed)
 Patient advised RN that the she believes the Hydromorphone  (Dilaudid ) is the cause of her ongoing nausea.  Patient complained of feeling nauseous prior to medication administration and PRN zofran  was administered.   Patient advised RN that she had emesis occurrence while in MRI unit but per MRI tech, no emesis occurred.   Patient endorses continued feelings of nausea and stated she Hydromorphone  (Dilaudid ) has caused her nausea/vomiting in the past.     Notified MD Vicci of patients concerns with adverse effects of PRN pain medication and requested review of orders for possible alternative options.

## 2024-01-06 NOTE — Progress Notes (Signed)
 01/06/2024 3:59 PM  Called by radiology regarding MRI results and right hydroureteronephrosis and recommendation for CT urogram w/wo contrast.  Discussed with Dr. Sherrilee and requested inpatient urology consult.  I could not find CT urogram order in Epic. Dr. Sherrilee said the correct test would be CT hematuria study which has been ordered.  Notified patient about results and urology consult.   KYM Louder, MD  How to contact the [facility] Attending or Consulting provider 7A - 7P or covering provider during after hours 7P -7A, for this patient?  Check the care team in [facility] and look for a) attending/consulting TRH provider listed and b) the TRH team listed Log into [email or web address] and use [facility] universal password to access. If you do not have the password, please contact the hospital operator. Locate the TRH provider you are looking for under [facility] and page to a number that you can be directly reached. If you still have difficulty reaching the provider, please page the [facility] (Director on Call) for the Hospitalists listed on amion for assistance.

## 2024-01-06 NOTE — Progress Notes (Signed)
 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 Patient was asked if she has tolerated any other steroids but states that all steroids have been avoided since her reaction to cortisone. Additionally, she is now refusing the CT with contrast. Urologist is being updated on this by secure chat.

## 2024-01-06 NOTE — TOC Progression Note (Signed)
 Transition of Care [facility]) - Progression Note    Patient Details  Name: Robin Blackburn MRN: [id number] Date of Birth: 1945-10-18  [facility] CM/SW Contact  Sharlyne Stabs, RN Phone Number: 01/06/2024, 11:31 AM  Clinical Narrative:   CM at the bedside to meeting patient and POA, no bed offers. CM explained we can expand the search, but we do not complete LTC bed offers. When medically ready she will return to [facility] and they will need to continue to search and transfer with social work there. [facility] is requesting a bedside visit. POA updated. TOC following.     Expected Discharge Plan: Long Term Acute Care (LTAC) Barriers to Discharge: Continued Medical Work up  Expected Discharge Plan and Services       Living arrangements for the past 2 months: Skilled Nursing Facility                   Social Drivers of Health (SDOH) Interventions SDOH Screenings   Food Insecurity: No Food Insecurity (01/04/2024)  Housing: Low Risk  (01/04/2024)  Transportation Needs: No Transportation Needs (01/04/2024)  Utilities: Not At Risk (01/04/2024)  Alcohol  Screen: Low Risk  (01/06/2022)  Depression (PHQ2-9): Low Risk  (12/15/2023)  Recent Concern: Depression (PHQ2-9) - Medium Risk (10/13/2023)  Financial Resource Strain: Low Risk  (01/06/2022)  Physical Activity: Inactive (11/07/2022)   Received from [facility]  Social Connections: Socially Isolated (01/04/2024)  Stress: No Stress Concern Present (01/06/2022)  Tobacco Use: Low Risk  (01/04/2024)    Readmission Risk Interventions    01/05/2024   10:44 AM  Readmission Risk Prevention Plan  Transportation Screening Complete  PCP or Specialist Appt within 5-7 Days Not Complete  Home Care Screening Complete  Medication Review (RN CM) Complete

## 2024-01-07 ENCOUNTER — Inpatient Hospital Stay (HOSPITAL_COMMUNITY)

## 2024-01-07 DIAGNOSIS — N3001 Acute cystitis with hematuria: Secondary | ICD-10-CM | POA: Diagnosis not present

## 2024-01-07 DIAGNOSIS — K429 Umbilical hernia without obstruction or gangrene: Secondary | ICD-10-CM | POA: Diagnosis not present

## 2024-01-07 DIAGNOSIS — N3000 Acute cystitis without hematuria: Secondary | ICD-10-CM | POA: Diagnosis not present

## 2024-01-07 DIAGNOSIS — N1330 Unspecified hydronephrosis: Secondary | ICD-10-CM | POA: Diagnosis not present

## 2024-01-07 DIAGNOSIS — K5730 Diverticulosis of large intestine without perforation or abscess without bleeding: Secondary | ICD-10-CM | POA: Diagnosis not present

## 2024-01-07 LAB — BASIC METABOLIC PANEL WITH GFR
Anion gap: 10 (ref 5–15)
BUN: 24 mg/dL — ABNORMAL HIGH (ref 8–23)
CO2: 21 mmol/L — ABNORMAL LOW (ref 22–32)
Calcium: 7.9 mg/dL — ABNORMAL LOW (ref 8.9–10.3)
Chloride: 106 mmol/L (ref 98–111)
Creatinine, Ser: 1.31 mg/dL — ABNORMAL HIGH (ref 0.44–1.00)
GFR, Estimated: 42 mL/min — ABNORMAL LOW (ref >60)
Glucose, Bld: 105 mg/dL — ABNORMAL HIGH (ref 70–99)
Potassium: 3.6 mmol/L (ref 3.5–5.1)
Sodium: 137 mmol/L (ref 135–145)

## 2024-01-07 LAB — GASTROINTESTINAL PANEL BY PCR, STOOL (REPLACES STOOL CULTURE)

## 2024-01-07 LAB — C DIFFICILE QUICK SCREEN W PCR REFLEX
C Diff antigen: NEGATIVE
C Diff interpretation: NOT DETECTED
C Diff toxin: NEGATIVE

## 2024-01-07 LAB — CBC WITH DIFFERENTIAL/PLATELET
Abs Immature Granulocytes: 0.35 K/uL — ABNORMAL HIGH (ref 0.00–0.07)
Basophils Absolute: 0.1 K/uL (ref 0.0–0.1)
Basophils Relative: 1 %
Eosinophils Absolute: 0.4 K/uL (ref 0.0–0.5)
Eosinophils Relative: 4 %
HCT: 29.7 % — ABNORMAL LOW (ref 36.0–46.0)
Hemoglobin: 9.7 g/dL — ABNORMAL LOW (ref 12.0–15.0)
Immature Granulocytes: 4 %
Lymphocytes Relative: 20 %
Lymphs Abs: 1.9 K/uL (ref 0.7–4.0)
MCH: 31.3 pg (ref 26.0–34.0)
MCHC: 32.7 g/dL (ref 30.0–36.0)
MCV: 95.8 fL (ref 80.0–100.0)
Monocytes Absolute: 1.1 K/uL — ABNORMAL HIGH (ref 0.1–1.0)
Monocytes Relative: 11 %
Neutro Abs: 5.7 K/uL (ref 1.7–7.7)
Neutrophils Relative %: 60 %
Platelets: 429 K/uL — ABNORMAL HIGH (ref 150–400)
RBC: 3.1 MIL/uL — ABNORMAL LOW (ref 3.87–5.11)
RDW: 14.7 % (ref 11.5–15.5)
WBC: 9.5 K/uL (ref 4.0–10.5)
nRBC: 0 % (ref 0.0–0.2)

## 2024-01-07 LAB — URINE CULTURE: Culture: 70000 — AB

## 2024-01-07 LAB — MAGNESIUM: Magnesium: 1.8 mg/dL (ref 1.7–2.4)

## 2024-01-07 MED ORDER — CYCLOSPORINE 0.05 % OP EMUL
1.0000 [drp] | Freq: Two times a day (BID) | OPHTHALMIC | Status: DC
  Administered 2024-01-07 – 2024-01-09 (×4): 1 [drp] via OPHTHALMIC
  Filled 2024-01-07 (×4): qty 30

## 2024-01-07 MED ORDER — LOPERAMIDE HCL 2 MG PO CAPS
2.0000 mg | ORAL_CAPSULE | ORAL | Status: DC | PRN
  Administered 2024-01-07 – 2024-01-09 (×6): 2 mg via ORAL
  Filled 2024-01-07 (×6): qty 1

## 2024-01-07 MED ORDER — DICLOFENAC SODIUM 1 % EX GEL
2.0000 g | Freq: Four times a day (QID) | CUTANEOUS | Status: DC | PRN
  Administered 2024-01-07: 2 g via TOPICAL
  Filled 2024-01-07: qty 100

## 2024-01-07 NOTE — Progress Notes (Signed)
 PROGRESS NOTE   Robin Blackburn  FMW:[id number] DOB: 05/25/1946 DOA: 01/04/2024 PCP: Bertell Satterfield, MD   Chief Complaint  Patient presents with   Altered Mental Status   Level of care: Med-Surg  Brief Admission History:  78 y.o. female with medical history significant of prior stroke, depression, rheumatoid arthritis who presents emergency department due to headache and confusion.  Patient was at nursing home and was notably more confused.  She has had issues with recurrent urinary tract infections and was on Macrobid .  Due to mental status changes she was brought to the ER for further assessment.   ED: Was afebrile and hemodynamically stable.  Labs were obtained on presentation which showed WBC 19.0, hemoglobin 7.9, platelets 573, sodium 134, creatinine 1.96 baseline around 1, urinalysis positive for infection.  Due to extensive drug allergies patient was started on Cipro .  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  It was discussed with patient and she was unaware of any specific medications that she had allergies to.  She states she has not had anaphylaxis to any antibiotics that she is aware of.  She was alert and oriented and able to converse.  She is complaining by nursing home and wants to move to a different one.    Assessment and Plan:  Acute infection encephalopathy most secondary to urinary tract infection - Patient was on nitrofurantoin  prior to arrival - Last urine cultures were September 2024 with E. coli/Enterococcus faecalis with sensitivity to Cipro .  And E. coli resistance to Cipro .   Switched Cipro  to>> Meropenem  due to E. coli resistance UA: Moderate hemoglobin, leukocyte Estrace , nitrites, many bacteria, WBC >50 Follow-up blood cultures - NGTD Follow-up urine cultures - Gram neg rods reported; awaiting ID and sensitivities, klebsiella pneumonia species  reported Resistant to everything except gentamicin and meropenem , continue meropenem     Acute on chronic anemia-due to anemia of chronic disease and severe iron deficiency - Drop in hemoglobin noted---requiring transfusion 2 units PRBC on 7/29 - Held Brilinta , DVT prophylaxis with Lovenox  - Pt tolerated 2U PRBC blood transfusion - iron studies consistent with iron deficiency, Hemoccult testing negative        Latest Ref Rng & Units 01/05/2024    8:03 AM 01/05/2024    4:34 AM 01/04/2024    7:35 PM  CBC  WBC 4.0 - 10.5 K/uL   16.1  19.0   Hemoglobin 12.0 - 15.0 g/dL 6.3  6.7  7.9   Hematocrit 36.0 - 46.0 % 20.9  21.7  26.4   Platelets 150 - 400 K/uL   464  573     Per patient and POA; had a recent blood transfusion approximate 2 weeks ago Iron/TIBC/Ferritin/ %Sat Labs (Brief)          Component Value Date/Time    IRON 10 (L) 01/05/2024 0803    TIBC 152 (L) 01/05/2024 0803    FERRITIN 125 12/23/2023 1355    IRONPCTSAT 7 (L) 01/05/2024 0803      Withholding IV iron due to active infection--will continue with supplement May need IV iron before discharge - did receive iron load with 2 units PRBC transfused    Generalized anxiety -continue Xanax    Hypertension -hypotensive, holding amlodipine , reducing metoprolol  dose   Chronic pain Chronic Back Pain -continue gabapentin  -pt complains of severe back pain since Fall in May 2025 -pt requesting imaging of back due to ongoing severe pain; no cauda equina symptoms -  MRI ordered of spine -- no acute findings in the spine  Right hydroureteronephrosis -- there was concern by radiologist that a mass was present. Pt updated with findings and urology consultation.  -- I consulted Dr. Sherrilee with urology dept and he agreed to consult on patient and says he is familiar with patient -- CT  urogram was ordered however patient refusing study as she has history of dye allergy that reportedly was severe -- further recommendations pending  urology consultation   Rheumatoid arthritis -continue Arava    History of stroke -continue Brilinta ;  continuing statins   Insomnia -continue trazodone    Chronic severe debility -bedbound-once stable reevaluated by PT OT  DVT prophylaxis: SCDs Code Status: Full  Communication: POA at bedside updated  Disposition: return to [facility]:  PT/OT Procedures:   Antimicrobials:    Subjective:Robin Blackburn Pt reports having loose stools, requesting imodium . Pt says she refuses a CT scan with contrast due to previous dye allergies.    Objective: Vitals:   01/06/24 1358 01/06/24 1911 01/06/24 2131 01/07/24 0315  BP: 131/73 (!) 128/56 (!) 128/56 (!) 128/56  Pulse: 85 98 98 65  Resp:  16    Temp: 98.7 F (37.1 C) 100.3 F (37.9 C)  97.6 F (36.4 C)  TempSrc: Oral Oral  Oral  SpO2: 98% 100%  99%  Weight:      Height:        Intake/Output Summary (Last 24 hours) at 01/07/2024 1239 Last data filed at 01/07/2024 0500 Gross per 24 hour  Intake 220 ml  Output 1450 ml  Net -1230 ml   Filed Weights   01/04/24 1836 01/04/24 2313  Weight: 68.9 kg 72.4 kg   Examination:  General exam: Appears calm and comfortable  Respiratory system: Clear to auscultation. Respiratory effort normal. Cardiovascular system: normal S1 & S2 heard. No JVD, murmurs, rubs, gallops or clicks. No pedal edema. Gastrointestinal system: Abdomen is nondistended, soft and nontender. No organomegaly or masses felt. Normal bowel sounds heard. Central nervous system: Alert and oriented. No focal neurological deficits. Extremities: Symmetric 5 x 5 power. Skin: No rashes, lesions or ulcers. Psychiatry: Judgement and insight appear normal. Mood & affect appropriate.   Data Reviewed: I have personally reviewed following labs and imaging studies  CBC: Recent Labs  Lab 01/04/24 1935 01/05/24 0434 01/05/24 0803 01/05/24 1848 01/06/24 0810 01/07/24 0427  WBC 19.0* 16.1*  --   --  13.6* 9.5  NEUTROABS  15.7*  --   --   --  9.7* 5.7  HGB 7.9* 6.7* 6.3* 10.2* 9.1* 9.7*  HCT 26.4* 21.7* 20.9* 32.7* 28.6* 29.7*  MCV 101.1* 101.4*  --   --  96.0 95.8  PLT 573* 464*  --   --  407* 429*    Basic Metabolic Panel: Recent Labs  Lab 01/04/24 1935 01/05/24 0434 01/06/24 0810 01/07/24 0427  NA 134* 134* 133* 137  K 4.9 4.4 4.2 3.6  CL 103 105 106 106  CO2 20* 20* 21* 21*  GLUCOSE 138* 118* 110* 105*  BUN 25* 24* 23 24*  CREATININE 1.96* 1.73* 1.44* 1.31*  CALCIUM  8.3* 7.7* 7.9* 7.9*  MG  --   --   --  1.8    CBG: No results for input(s): GLUCAP in the last 168 hours.  Recent Results (from the past 240 hours)  Urine Culture     Status: Abnormal   Collection Time: 01/04/24  8:17 PM   Specimen: Urine, Catheterized  Result Value  Ref Range Status   Specimen Description   Final    URINE, CATHETERIZED Performed at [facility], [address], [place], [place] [address]    Special Requests   Final    NONE Performed at [facility], [address], [place], [place] [address]    Culture   Final    Two isolates with different morphologies were identified as the same organism.The most resistant organism was reported. 70,000 COLONIES/mL KLEBSIELLA PNEUMONIAE Confirmed Extended Spectrum Beta-Lactamase Producer (ESBL).  In bloodstream infections from ESBL organisms, carbapenems are preferred over piperacillin/tazobactam. They are shown to have a lower risk of mortality.    Report Status 01/07/2024 FINAL  Final   Organism ID, Bacteria KLEBSIELLA PNEUMONIAE (A)  Final      Susceptibility   Klebsiella pneumoniae - MIC*    AMPICILLIN >=32 RESISTANT Resistant     CEFAZOLIN >=64 RESISTANT Resistant     CEFTRIAXONE  >=64 RESISTANT Resistant     CIPROFLOXACIN  >=4 RESISTANT Resistant     GENTAMICIN <=1 SENSITIVE Sensitive     IMIPENEM <=0.25 SENSITIVE Sensitive     NITROFURANTOIN  >=512 RESISTANT Resistant     TRIMETH /SULFA  >=320 RESISTANT Resistant     AMPICILLIN/SULBACTAM >=32 RESISTANT  Resistant     * 70,000 COLONIES/mL KLEBSIELLA PNEUMONIAE  C Difficile Quick Screen w PCR reflex     Status: None   Collection Time: 01/06/24  5:45 AM   Specimen: STOOL  Result Value Ref Range Status   C Diff antigen NEGATIVE NEGATIVE Final   C Diff toxin NEGATIVE NEGATIVE Final   C Diff interpretation No C. difficile detected.  Final    Comment: Performed at [facility], [address], [place], [place] [address]     Radiology Studies: MR CERVICAL SPINE WO CONTRAST Result Date: 01/06/2024 CLINICAL DATA:  Neck pain, chronic, degenerative changes on xray EXAM: MRI CERVICAL SPINE WITHOUT CONTRAST TECHNIQUE: Multiplanar, multisequence MR imaging of the cervical spine was performed. No intravenous contrast was administered. COMPARISON:  MRI of the cervical spine dated December 02, 2008. FINDINGS: Alignment: There is mild reversal of the normal cervical lordosis in the lower cervical spine. There is degenerative anterolisthesis and focal kyphosis at C4-5. Vertebrae: The vertebral bodies maintain their height and demonstrate normal signal intensity. No osseous lesions are present. Cord: The study is limited by motion, but the spinal cord appears to be normal in morphology and signal intensity. Posterior Fossa, vertebral arteries, paraspinal tissues: Negative. Disc levels: C2-3: Normal. C3-4: Normal. C4-5: Degenerative grade 1 anterolisthesis with focal kyphosis. No significant spinal canal or neural foraminal stenosis. C5-6: Diffuse disc bulging with mild-to-moderate central spinal canal stenosis and mild-to-moderate bilateral neural foraminal stenosis. C6-7: Mild diffuse disc bulging with mild central spinal canal stenosis. The neural foramina are patent. C7-T1: Normal. IMPRESSION: 1. Degenerative anterolisthesis at C4-5. 2. 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 No obvious spinal cord or nerve root impingement.  Electronically Signed   By: Evalene Coho M.D.   On: 01/06/2024 13:58   MR LUMBAR SPINE WO CONTRAST Result Date: 01/06/2024 CLINICAL DATA:  Low back pain, symptoms persist with > 6 wks treatment Lumbar radiculopathy, symptoms persist with > 6 wks treatment EXAM: MRI LUMBAR SPINE WITHOUT CONTRAST TECHNIQUE: Multiplanar, multisequence MR imaging of the lumbar spine was performed. No intravenous contrast was administered. COMPARISON:  MRI of the lumbar spine dated February 12, 2011. FINDINGS: Segmentation:  5 non rib-bearing lumbar type vertebrae. Alignment:  Mild levo  scoliosis of the thoracolumbar spine. Vertebrae: The vertebral bodies maintain their height. There are discogenic reactive changes present within the endplates at each disc space level. There are no osseous lesions present. Conus medullaris and cauda equina: Conus extends to the T12-L1 level. Conus and cauda equina appear normal. Paraspinal and other soft tissues: There is moderate right hydroureteronephrosis present. There is also abnormal thickening of the wall of the proximal right ureter and there is abrupt loss of the lumen from image 372 a image 38 of series 8. There is a prominent extrarenal pelvis present on the left, but the left ureter is normal in caliber and unremarkable. The paraspinous soft tissues are otherwise grossly unremarkable. Disc levels: L1-2: Mild disc space narrowing and minimal disc bulging. No significant spinal canal or neural foraminal stenosis. L2-3: Slight degenerative anterolisthesis. Mild diffuse disc bulging with mild central spinal canal stenosis and bilateral lateral recess stenosis. No apparent nerve root impingement. L3-4: Minimal disc bulging. No significant spinal canal or neural foraminal stenosis. L4-5: Mild disc bulging and endplate ridging. No significant spinal canal or neural foraminal stenosis. L5-S1: Status post left laminotomy and medial facet ectomy. Disc space narrowing. No residual spinal canal  stenosis. There is mild-to-moderate left neural foraminal stenosis, but no apparent nerve root impingement. IMPRESSION: 1. There is right-sided hydroureteronephrosis with abnormal thickening of the wall of the ureter and abrupt and of the lumen, concerning for either endo luminal or endo mural mass. Recommend follow-up CT urogram without and with intravenous contrast. 2. Mild levoscoliosis and multilevel chronic degenerative disc disease without significant central spinal canal or neural foraminal stenosis. No obvious nerve root impingement. Electronically Signed   By: Evalene Coho M.D.   On: 01/06/2024 13:52   MR THORACIC SPINE WO CONTRAST Result Date: 01/06/2024 CLINICAL DATA:  Mid-back pain Mid-back pain, spondyloarthropathy suspected, xray done EXAM: MRI THORACIC SPINE WITHOUT CONTRAST TECHNIQUE: Multiplanar, multisequence MR imaging of the thoracic spine was performed. No intravenous contrast was administered. COMPARISON:  Thoracic spine series dated June 24, 2014. FINDINGS: Alignment: There is mild sigmoid scoliosis of the thoracic spine, with the upper curvature convex the left. Vertebrae: The vertebral bodies maintain their height. There are several endplate irregularities, but no acute fractures or osseous lesions present. Cord:  Normal in morphology and signal intensity. Paraspinal and other soft tissues: Negative. Disc levels: There is mild disc bulging with mild central spinal canal stenosis at T3-4, T8-9, T11-12 and T12-L1. There is no spinal cord or nerve root impingement. IMPRESSION: 1. Mild sigmoid scoliosis and multilevel degenerative disc disease without significant spinal canal or neural foraminal stenosis. Electronically Signed   By: Evalene Coho M.D.   On: 01/06/2024 13:38    Scheduled Meds:  gabapentin   100 mg Oral TID   leflunomide   10 mg Oral Daily   methenamine   1,000 mg Oral QID   metoprolol  tartrate  12.5 mg Oral BID   ticagrelor   60 mg Oral Daily   torsemide   20  mg Oral Daily   traZODone   100 mg Oral QHS   Continuous Infusions:  meropenem  (MERREM ) IV 1 g (01/07/24 0225)     LOS: 3 days   Time spent: 55 mins  Tynasia Mccaul Vicci, MD How to contact the [facility] Attending or Consulting provider 7A - 7P or covering provider during after hours 7P -7A, for this patient?  Check the care team in [facility] and look for a) attending/consulting [facility] provider listed and b) the [facility] team listed Log into [email or web address] to find provider  on call.  Locate the [facility] provider you are looking for under [facility] and page to a number that you can be directly reached. If you still have difficulty reaching the provider, please page the [facility] (Director on Call) for the Hospitalists listed on amion for assistance.  01/07/2024, 12:39 PM

## 2024-01-07 NOTE — Evaluation (Signed)
 Physical Therapy Evaluation Patient Details Name: Robin Blackburn MRN: [id number] DOB: 03/13/1946 Today's Date: 01/07/2024  History of Present Illness  per FI:Robin Blackburn is a 78 y.o. female with medical history significant of prior stroke, depression, rheumatoid arthritis who presents emergency department due to headache and confusion.  Patient was at nursing home and was notably more confused.  She has had issues with recurrent urinary tract infections and was on Macrobid .  Due to mental status changes she was brought to the ER for further assessment.  On arrival she was afebrile and hemodynamically stable.  Labs were obtained on presentation which showed WBC 19.0, hemoglobin 7.9, platelets 573, sodium 134, creatinine 1.96 baseline around 1, urinalysis positive for infection  Clinical Impression  Pt is a pleasant woman who has been a resident at [facility].  States she had advanced to walking with a walker but then fell and is not suppose to put wt on her Rt knee until cleared from Ortho.  I believe she has this admission confused with her admission in May.  Therapist did as much mobility as pt would allow.  Pt will continue from skilled PT to increase her ability to transfer and ambulate to assist caregivers.         If plan is discharge home, recommend the following: A lot of help with walking and/or transfers;A lot of help with bathing/dressing/bathroom;Assistance with cooking/housework;Direct supervision/assist for medications management   Can travel by private vehicle    yes    Equipment Recommendations None recommended by PT  Recommendations for Other Services       Functional Status Assessment Patient has had a recent decline in their functional status and demonstrates the ability to make significant improvements in function in a reasonable and predictable amount of time.     Precautions / Restrictions Precautions Precautions: Fall Recall of Precautions/Restrictions:  Intact Restrictions Weight Bearing Restrictions Per Provider Order: No Other Position/Activity Restrictions: Per pt she fell at SNF, was in rehab and they were limiting pt WB until they heard back from the ortho. Unsure if this is factual      Mobility  Bed Mobility Overal bed mobility: Needs Assistance Bed Mobility: Supine to Sit, Sit to Supine     Supine to sit: Min assist          Transfers Overall transfer level: Needs assistance   Transfers: Sit to/from Stand             General transfer comment: able to scoot from end of bed to head of bed with min assist    Ambulation/Gait Ambulation/Gait assistance: Max assist                     Pertinent Vitals/Pain Pain Assessment Pain Assessment: Faces (states she feels like she has been ran over by a truck sore all over) Faces Pain Scale: Hurts a little bit Pain Descriptors / Indicators: Sore Pain Intervention(s): Limited activity within patient's tolerance    Home Living Family/patient expects to be discharged to:: Skilled nursing facility                        Prior Function Prior Level of Function : Needs assist             Mobility Comments: used a walker for short distances with assist ADLs Comments: assist     Extremity/Trunk Assessment        Lower Extremity Assessment Lower Extremity Assessment:  Generalized weakness       Communication        Cognition Arousal: Alert Behavior During Therapy: WFL for tasks assessed/performed   PT - Cognitive impairments: No apparent impairments                                       General Comments      Exercises General Exercises - Lower Extremity Ankle Circles/Pumps: 10 reps, Both Quad Sets: Both, 10 reps Gluteal Sets: Both, 10 reps Long Arc Quad: Both, 10 reps, Seated Heel Slides: Both, 10 reps Hip ABduction/ADduction: Both, 10 reps, Seated Hip Flexion/Marching: Both, 10 reps, Seated   Assessment/Plan     PT Assessment Patient needs continued PT services  PT Problem List Decreased strength;Decreased range of motion;Decreased balance;Decreased activity tolerance;Decreased mobility;Pain       PT Treatment Interventions Gait training;Functional mobility training;Therapeutic exercise    PT Goals (Current goals can be found in the Care Plan section)       Frequency Min 2X/week        AM-PAC PT 6 Clicks Mobility  Outcome Measure Help needed turning from your back to your side while in a flat bed without using bedrails?: A Little Help needed moving from lying on your back to sitting on the side of a flat bed without using bedrails?: A Little Help needed moving to and from a bed to a chair (including a wheelchair)?: A Lot Help needed standing up from a chair using your arms (e.g., wheelchair or bedside chair)?: A Lot Help needed to walk in hospital room?: A Lot Help needed climbing 3-5 steps with a railing? : Total 6 Click Score: 13    End of Session   Activity Tolerance: Patient tolerated treatment well Patient left: in bed Nurse Communication: Mobility status PT Visit Diagnosis: Unsteadiness on feet (R26.81);Other abnormalities of gait and mobility (R26.89);Muscle weakness (generalized) (M62.81)    Time: 1340-1415 PT Time Calculation (min) (ACUTE ONLY): 35 min   Charges:   PT Evaluation $PT Eval Low Complexity: 1 Low PT Treatments $Therapeutic Exercise: 8-22 mins PT General Charges $$ ACUTE PT VISIT: 1 Visit        Montie Metro, PT CLT [phone]  01/07/2024, 2:18 PM

## 2024-01-07 NOTE — Plan of Care (Signed)
  Problem: Education: Goal: Knowledge of General Education information will improve Description: Including pain rating scale, medication(s)/side effects and non-pharmacologic comfort measures Outcome: Progressing   Problem: Coping: Goal: Level of anxiety will decrease Outcome: Progressing   Problem: Pain Managment: Goal: General experience of comfort will improve and/or be controlled Outcome: Progressing

## 2024-01-07 NOTE — Consult Note (Signed)
 Urology Consult  Referring physician: Dr. Vicci Reason for referral: right hydronephrosis  Chief Complaint: dysuria  History of Present Illness: Robin Blackburn is a 78yo with a history of recurrent UTI and OAB who was admitted with mental status change and UTI.  She was last seen by me in January 2024 for recurrent UTi and was placed on macrodantin  for prophylaxis and oxybutynin  for OAB. 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 Currently she complains of pelvic pain, dysuria, and urinary urgency. She underwent lumbar MRI during this hospitalization which showed right hydronephrosis. Ct stone study was ordered which showed a thick walled bladder with perivesical straining and mild bilateral hydronephrosis likely related to acute cystitis. Urine culture growing klebsiella sensitive to gentamicin and imipenem.   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 Allergies:  Allergies  Allergen Reactions   Cortisone Anaphylaxis    Cardiovascular Arrest   Fentanyl  Anaphylaxis, Hives and Other (See Comments)    felt like I had demons in my head   Hydromorphone  Anaphylaxis and Nausea And Vomiting    GI Intolerance   Iodinated Contrast Media Anaphylaxis, Hives, Rash and Dermatitis    Respiratory Distress  Iodinated contrast media (substance)   Keflex  [Cephalexin ] Nausea And Vomiting   Meperidine Anaphylaxis and Shortness Of Breath    Respiratory Distress   Morphine Anaphylaxis, Nausea And  Vomiting and Swelling    GI Intolerance   Oxycodone  Anaphylaxis   Oxycodone -Acetaminophen  Nausea And Vomiting, Rash, Swelling and Dermatitis    GI Intolerance, Mouth swelling.  acetaminophen  / oxycodone    Penicillins Anaphylaxis, Nausea And Vomiting and Other (See Comments)    Immediate rash, facial/tongue/throat swelling, SOB or lightheadedness with hypotension  Product containing penicillin (product)   Afluria Preservative Free [Influenza Virus Vacc Split Pf] Other (See Comments)    Unknown   Aspirin  Other (See Comments)    GI Intolerance   Ciprofloxacin  Hcl Other (See Comments)    Unknown    Codeine Other (See Comments)    Unknown    Influenza Vaccines Other (See Comments)    Unknown    Influenza Virus Vaccine Other (See Comments)    Unknown    Iohexol Other (See Comments)    Unknown   Meperidine Hcl Other (See Comments)    Unknown   Misc. Sulfonamide Containing Compounds Other (See Comments)    Unknown   Oxycodone  Hcl Other (See Comments)    Unknown   Penicillamine Other (See Comments)    Unknown    Shellfish Allergy Other (See Comments)    Glucosamine not an option Unknown    Sulfa  Antibiotics Nausea And Vomiting    Tolerates Bactrim  though   Troleandomycin Nausea And Vomiting and Swelling    GI Intolerance   Bisphosphonates Hives and Other (See Comments)    GI intolerance   Ciprofloxacin  Nausea Only and Rash   Iodine  Hives   Levaquin [Levofloxacin In D5w] Nausea And Vomiting and Rash   Levofloxacin Hives and Other (See Comments)    Mental Status Changes, Confusion   Morphine And Codeine Rash   Oxytetracycline Nausea And Vomiting   Percocet [Oxycodone -Acetaminophen ] Swelling    Mouth swelling.   Sulfur Nausea And Vomiting, Rash and Dermatitis    GI Intolerance  sulfur    Family History  Problem Relation Age of Onset   Hypertension Father    Transient ischemic attack Father    Hypertension Brother    Leukemia Brother    CVA Maternal Grandfather     Social History:  reports that she has never smoked. She has never been exposed to tobacco smoke. She has never used smokeless tobacco. She reports that she does not drink alcohol  and does not use drugs.  Review of Systems  Constitutional:  Positive for fatigue.  Genitourinary:  Positive for difficulty urinating, dysuria and urgency.  All other systems reviewed and are negative.   Physical Exam:  Vital signs in last 24 hours: Temp:  [97.6 F (36.4 C)-100.3 F (37.9 C)] 98.1 F (36.7 C) (07/31 1416) Pulse Rate:  [65-98] 89 (07/31 1416) Resp:  [16-18] 18 (07/31 1416) BP: (128-146)/(56-61) 146/61 (07/31 1416) SpO2:  [98 %-100 %] 98 % (07/31 1416) Physical Exam Nursing note reviewed.  Constitutional:      Appearance: Normal appearance.  HENT:     Head: Normocephalic and atraumatic.     Nose: Nose normal. No congestion.     Mouth/Throat:     Mouth: Mucous membranes are moist.  Eyes:     Extraocular Movements: Extraocular movements intact.     Pupils: Pupils are equal, round, and reactive to light.  Cardiovascular:     Rate and Rhythm: Normal rate and regular rhythm.  Pulmonary:     Effort: Pulmonary effort is normal. No respiratory distress.  Abdominal:     General: Abdomen is flat. There is no distension.  Musculoskeletal:     Cervical back: Normal range of motion and neck supple.  Skin:    General: Skin is warm and dry.  Neurological:     General: No focal deficit present.     Mental Status: She is alert and oriented to person, place, and time.  Psychiatric:        Mood and Affect: Mood normal.        Behavior: Behavior normal.        Thought Content: Thought content normal.        Judgment: Judgment normal.     Laboratory Data:  Results for orders placed or performed during the hospital encounter of 01/04/24 (from the past 72 hours)  CBC with Differential     Status: Abnormal   Collection Time: 01/04/24  7:35 PM  Result Value Ref Range   WBC 19.0 (H) 4.0 -  10.5 K/uL   RBC 2.61 (L) 3.87 - 5.11 MIL/uL   Hemoglobin 7.9 (L) 12.0 - 15.0 g/dL   HCT 73.5 (L) 63.9 - 53.9 %   MCV 101.1 (H) 80.0 - 100.0 fL   MCH 30.3 26.0 - 34.0 pg   MCHC 29.9 (L) 30.0 - 36.0 g/dL   RDW 85.1 88.4 - 84.4 %   Platelets 573 (H) 150 - 400 K/uL   nRBC 0.0 0.0 - 0.2 %   Neutrophils Relative % 83 %   Neutro Abs 15.7 (H) 1.7 - 7.7 K/uL   Lymphocytes Relative 8 %   Lymphs Abs 1.5  0.7 - 4.0 K/uL   Monocytes Relative 7 %   Monocytes Absolute 1.4 (H) 0.1 - 1.0 K/uL   Eosinophils Relative 0 %   Eosinophils Absolute 0.1 0.0 - 0.5 K/uL   Basophils Relative 1 %   Basophils Absolute 0.1 0.0 - 0.1 K/uL   Immature Granulocytes 1 %   Abs Immature Granulocytes 0.19 (H) 0.00 - 0.07 K/uL    Comment: Performed at [facility], [address] [place], [place] [address]  Comprehensive metabolic panel     Status: Abnormal   Collection Time: 01/04/24  7:35 PM  Result Value Ref Range   Sodium 134 (L) 135 - 145 mmol/L   Potassium 4.9 3.5 - 5.1 mmol/L   Chloride 103 98 - 111 mmol/L   CO2 20 (L) 22 - 32 mmol/L   Glucose, Bld 138 (H) 70 - 99 mg/dL    Comment: Glucose reference range applies only to samples taken after fasting for at least 8 hours.   BUN 25 (H) 8 - 23 mg/dL   Creatinine, Ser 8.03 (H) 0.44 - 1.00 mg/dL   Calcium  8.3 (L) 8.9 - 10.3 mg/dL   Total Protein 6.8 6.5 - 8.1 g/dL   Albumin 2.5 (L) 3.5 - 5.0 g/dL   AST 13 (L) 15 - 41 U/L   ALT 9 0 - 44 U/L   Alkaline Phosphatase 165 (H) 38 - 126 U/L   Total Bilirubin 1.2 0.0 - 1.2 mg/dL   GFR, Estimated 26 (L) >60 mL/min    Comment: (NOTE) Calculated using the CKD-EPI Creatinine Equation (2021)    Anion gap 11 5 - 15    Comment: Performed at [facility], [address], [place], [place] [address]  Urinalysis, Routine w reflex microscopic -Urine, Catheterized     Status: Abnormal   Collection Time: 01/04/24  8:17 PM  Result Value Ref Range   Color, Urine ORANGE (A) YELLOW   APPearance TURBID (A) CLEAR   Specific Gravity,  Urine 1.016 1.005 - 1.030   pH 5.0 5.0 - 8.0   Glucose, UA NEGATIVE NEGATIVE mg/dL   Hgb urine dipstick MODERATE (A) NEGATIVE   Bilirubin Urine NEGATIVE NEGATIVE   Ketones, ur NEGATIVE NEGATIVE mg/dL   Protein, ur 899 (A) NEGATIVE mg/dL   Nitrite POSITIVE (A) NEGATIVE   Leukocytes,Ua SMALL (A) NEGATIVE   RBC / HPF >50 0 - 5 RBC/hpf   WBC, UA >50 0 - 5 WBC/hpf   Bacteria, UA MANY (A) NONE SEEN   Squamous Epithelial / HPF 0-5 0 - 5 /HPF   WBC Clumps PRESENT    Non Squamous Epithelial 0-5 (A) NONE SEEN    Comment: Performed at [facility], [address], [place], [place] [address]  Urine Culture     Status: Abnormal   Collection Time: 01/04/24  8:17 PM   Specimen: Urine, Catheterized  Result Value Ref Range   Specimen Description      URINE, CATHETERIZED Performed at [facility], [address], [place], [place] [address]    Special Requests      NONE Performed at [facility], [address] [place], [place] [address]    Culture      Two isolates with different morphologies were identified as the same organism.The most resistant organism was reported. 70,000 COLONIES/mL KLEBSIELLA PNEUMONIAE Confirmed Extended Spectrum Beta-Lactamase Producer (ESBL).  In bloodstream infections from ESBL organisms, carbapenems are preferred over piperacillin/tazobactam. They are shown to have a lower risk of mortality.    Report Status  01/07/2024 FINAL    Organism ID, Bacteria KLEBSIELLA PNEUMONIAE (A)       Susceptibility   Klebsiella pneumoniae - MIC*    AMPICILLIN >=32 RESISTANT Resistant     CEFAZOLIN >=64 RESISTANT Resistant     CEFTRIAXONE  >=64 RESISTANT Resistant     CIPROFLOXACIN  >=4 RESISTANT Resistant     GENTAMICIN <=1 SENSITIVE Sensitive     IMIPENEM <=0.25 SENSITIVE Sensitive     NITROFURANTOIN  >=512 RESISTANT Resistant     TRIMETH /SULFA  >=320 RESISTANT Resistant     AMPICILLIN/SULBACTAM >=32 RESISTANT Resistant     * 70,000 COLONIES/mL KLEBSIELLA PNEUMONIAE  Comprehensive  metabolic panel     Status: Abnormal   Collection Time: 01/05/24  4:34 AM  Result Value Ref Range   Sodium 134 (L) 135 - 145 mmol/L   Potassium 4.4 3.5 - 5.1 mmol/L   Chloride 105 98 - 111 mmol/L   CO2 20 (L) 22 - 32 mmol/L   Glucose, Bld 118 (H) 70 - 99 mg/dL    Comment: Glucose reference range applies only to samples taken after fasting for at least 8 hours.   BUN 24 (H) 8 - 23 mg/dL   Creatinine, Ser 8.26 (H) 0.44 - 1.00 mg/dL   Calcium  7.7 (L) 8.9 - 10.3 mg/dL   Total Protein 5.8 (L) 6.5 - 8.1 g/dL   Albumin 2.1 (L) 3.5 - 5.0 g/dL   AST 11 (L) 15 - 41 U/L   ALT 9 0 - 44 U/L   Alkaline Phosphatase 141 (H) 38 - 126 U/L   Total Bilirubin 1.3 (H) 0.0 - 1.2 mg/dL   GFR, Estimated 30 (L) >60 mL/min    Comment: (NOTE) Calculated using the CKD-EPI Creatinine Equation (2021)    Anion gap 9 5 - 15    Comment: Performed at [facility], [address], [place], [place] [address]  CBC     Status: Abnormal   Collection Time: 01/05/24  4:34 AM  Result Value Ref Range   WBC 16.1 (H) 4.0 - 10.5 K/uL   RBC 2.14 (L) 3.87 - 5.11 MIL/uL   Hemoglobin 6.7 (LL) 12.0 - 15.0 g/dL    Comment: REPEATED TO VERIFY THIS CRITICAL RESULT HAS VERIFIED AND BEEN CALLED TO B BROOKS RN BY JALEESA WHITE ON 07 29 2025 AT 0548, AND HAS BEEN READ BACK.     HCT 21.7 (L) 36.0 - 46.0 %   MCV 101.4 (H) 80.0 - 100.0 fL   MCH 31.3 26.0 - 34.0 pg   MCHC 30.9 30.0 - 36.0 g/dL   RDW 85.0 88.4 - 84.4 %   Platelets 464 (H) 150 - 400 K/uL   nRBC 0.0 0.0 - 0.2 %    Comment: Performed at [facility], [address], [place], [place] [address]  Iron and TIBC     Status: Abnormal   Collection Time: 01/05/24  8:03 AM  Result Value Ref Range   Iron 10 (L) 28 - 170 ug/dL   TIBC 847 (L) 749 - 549 ug/dL   Saturation Ratios 7 (L) 10.4 - 31.8 %   UIBC 142 ug/dL    Comment: Performed at [facility], [address] [place], [place] [address]  Hemoglobin and hematocrit, blood     Status: Abnormal   Collection Time: 01/05/24   8:03 AM  Result Value Ref Range   Hemoglobin 6.3 (LL) 12.0 - 15.0 g/dL    Comment: REPEATED TO VERIFY THIS CRITICAL RESULT HAS VERIFIED AND BEEN CALLED TO D SMITH BY  JASMINE A ON 07 29 2025 AT 0822, AND HAS BEEN READ BACK.     HCT 20.9 (L) 36.0 - 46.0 %    Comment: Performed at [facility], [address], [place], [place] [address]  Type and screen [facility]     Status: None   Collection Time: 01/05/24  8:03 AM  Result Value Ref Range   ABO/RH(D) O POS    Antibody Screen NEG    Sample Expiration 01/08/2024,2359    Unit Number [id number]    Blood Component Type RED CELLS,LR    Unit division 00    Status of Unit ISSUED,FINAL    Transfusion Status OK TO TRANSFUSE    Crossmatch Result Compatible    Unit Number [id number]    Blood Component Type RED CELLS,LR    Unit division 00    Status of Unit ISSUED,FINAL    Transfusion Status OK TO TRANSFUSE    Crossmatch Result      Compatible Performed at [facility], [address] [place], [place] [address]   Prepare RBC (crossmatch)     Status: None   Collection Time: 01/05/24  8:03 AM  Result Value Ref Range   Order Confirmation      ORDER PROCESSED BY BLOOD BANK Performed at [facility], [address], [place], [place] [address]   Occult blood card to lab, stool     Status: None   Collection Time: 01/05/24  2:00 PM  Result Value Ref Range   Fecal Occult Bld NEGATIVE NEGATIVE    Comment: Performed at [facility], [address], [place], [place] [address]  Hemoglobin and hematocrit, blood     Status: Abnormal   Collection Time: 01/05/24  6:48 PM  Result Value Ref Range   Hemoglobin 10.2 (L) 12.0 - 15.0 g/dL   HCT 67.2 (L) 63.9 - 53.9 %    Comment: Performed at [facility], [address], [place], [place] [address]  C Difficile Quick Screen w PCR reflex     Status: None   Collection Time: 01/06/24  5:45 AM   Specimen: STOOL  Result Value Ref Range   C Diff antigen NEGATIVE NEGATIVE   C Diff toxin NEGATIVE  NEGATIVE   C Diff interpretation No C. difficile detected.     Comment: Performed at [facility], [address], [place], [place] [address]  Gastrointestinal Panel by PCR , Stool     Status: None   Collection Time: 01/06/24  5:45 AM   Specimen: Stool  Result Value Ref Range   Campylobacter species NOT DETECTED NOT DETECTED   Plesimonas shigelloides NOT DETECTED NOT DETECTED   Salmonella species NOT DETECTED NOT DETECTED   Yersinia enterocolitica NOT DETECTED NOT DETECTED   Vibrio species NOT DETECTED NOT DETECTED   Vibrio cholerae NOT DETECTED NOT DETECTED   Enteroaggregative E coli (EAEC) NOT DETECTED NOT DETECTED   Enteropathogenic E coli (EPEC) NOT DETECTED NOT DETECTED   Enterotoxigenic E coli (ETEC) NOT DETECTED NOT DETECTED   Shiga like toxin producing E coli (STEC) NOT DETECTED NOT DETECTED   Shigella/Enteroinvasive E coli (EIEC) NOT DETECTED NOT DETECTED   Cryptosporidium NOT DETECTED NOT DETECTED   Cyclospora cayetanensis NOT DETECTED NOT DETECTED   Entamoeba histolytica NOT DETECTED NOT DETECTED   Giardia lamblia NOT DETECTED NOT DETECTED   Adenovirus F40/41 NOT DETECTED NOT DETECTED   Astrovirus NOT DETECTED NOT DETECTED   Norovirus GI/GII NOT DETECTED NOT DETECTED   Rotavirus A NOT DETECTED NOT DETECTED  Sapovirus (I, II, IV, and V) NOT DETECTED NOT DETECTED    Comment: Performed at [facility], [address] [place], [place] [address]  CBC with Differential/Platelet     Status: Abnormal   Collection Time: 01/06/24  8:10 AM  Result Value Ref Range   WBC 13.6 (H) 4.0 - 10.5 K/uL   RBC 2.98 (L) 3.87 - 5.11 MIL/uL   Hemoglobin 9.1 (L) 12.0 - 15.0 g/dL   HCT 71.3 (L) 63.9 - 53.9 %   MCV 96.0 80.0 - 100.0 fL   MCH 30.5 26.0 - 34.0 pg   MCHC 31.8 30.0 - 36.0 g/dL   RDW 84.5 88.4 - 84.4 %   Platelets 407 (H) 150 - 400 K/uL   nRBC 0.0 0.0 - 0.2 %   Neutrophils Relative % 72 %   Neutro Abs 9.7 (H) 1.7 - 7.7 K/uL   Lymphocytes Relative 13 %   Lymphs Abs  1.8 0.7 - 4.0 K/uL   Monocytes Relative 9 %   Monocytes Absolute 1.3 (H) 0.1 - 1.0 K/uL   Eosinophils Relative 3 %   Eosinophils Absolute 0.4 0.0 - 0.5 K/uL   Basophils Relative 1 %   Basophils Absolute 0.1 0.0 - 0.1 K/uL   Immature Granulocytes 2 %   Abs Immature Granulocytes 0.26 (H) 0.00 - 0.07 K/uL    Comment: Performed at [facility], [address], [place], [place] [address]  Basic metabolic panel with GFR     Status: Abnormal   Collection Time: 01/06/24  8:10 AM  Result Value Ref Range   Sodium 133 (L) 135 - 145 mmol/L   Potassium 4.2 3.5 - 5.1 mmol/L   Chloride 106 98 - 111 mmol/L   CO2 21 (L) 22 - 32 mmol/L   Glucose, Bld 110 (H) 70 - 99 mg/dL    Comment: Glucose reference range applies only to samples taken after fasting for at least 8 hours.   BUN 23 8 - 23 mg/dL   Creatinine, Ser 8.55 (H) 0.44 - 1.00 mg/dL   Calcium  7.9 (L) 8.9 - 10.3 mg/dL   GFR, Estimated 37 (L) >60 mL/min    Comment: (NOTE) Calculated using the CKD-EPI Creatinine Equation (2021)    Anion gap 6 5 - 15    Comment: Performed at [facility], [address], [place], [place] [address]  CBC with Differential/Platelet     Status: Abnormal   Collection Time: 01/07/24  4:27 AM  Result Value Ref Range   WBC 9.5 4.0 - 10.5 K/uL   RBC 3.10 (L) 3.87 - 5.11 MIL/uL   Hemoglobin 9.7 (L) 12.0 - 15.0 g/dL   HCT 70.2 (L) 63.9 - 53.9 %   MCV 95.8 80.0 - 100.0 fL   MCH 31.3 26.0 - 34.0 pg   MCHC 32.7 30.0 - 36.0 g/dL   RDW 85.2 88.4 - 84.4 %   Platelets 429 (H) 150 - 400 K/uL   nRBC 0.0 0.0 - 0.2 %   Neutrophils Relative % 60 %   Neutro Abs 5.7 1.7 - 7.7 K/uL   Lymphocytes Relative 20 %   Lymphs Abs 1.9 0.7 - 4.0 K/uL   Monocytes Relative 11 %   Monocytes Absolute 1.1 (H) 0.1 - 1.0 K/uL   Eosinophils Relative 4 %   Eosinophils Absolute 0.4 0.0 - 0.5 K/uL   Basophils Relative 1 %   Basophils Absolute 0.1 0.0 - 0.1 K/uL   Immature Granulocytes 4 %   Abs Immature Granulocytes 0.35 (  H) 0.00 - 0.07 K/uL     Comment: Performed at [facility], [address] [place], [place] [address]  Basic metabolic panel with GFR     Status: Abnormal   Collection Time: 01/07/24  4:27 AM  Result Value Ref Range   Sodium 137 135 - 145 mmol/L   Potassium 3.6 3.5 - 5.1 mmol/L   Chloride 106 98 - 111 mmol/L   CO2 21 (L) 22 - 32 mmol/L   Glucose, Bld 105 (H) 70 - 99 mg/dL    Comment: Glucose reference range applies only to samples taken after fasting for at least 8 hours.   BUN 24 (H) 8 - 23 mg/dL   Creatinine, Ser 8.68 (H) 0.44 - 1.00 mg/dL   Calcium  7.9 (L) 8.9 - 10.3 mg/dL   GFR, Estimated 42 (L) >60 mL/min    Comment: (NOTE) Calculated using the CKD-EPI Creatinine Equation (2021)    Anion gap 10 5 - 15    Comment: Performed at [facility], [address], [place], [place] [address]  Magnesium     Status: None   Collection Time: 01/07/24  4:27 AM  Result Value Ref Range   Magnesium 1.8 1.7 - 2.4 mg/dL    Comment: Performed at [facility], [address] [place], [place] [address]   Recent Results (from the past 240 hours)  Urine Culture     Status: Abnormal   Collection Time: 01/04/24  8:17 PM   Specimen: Urine, Catheterized  Result Value Ref Range Status   Specimen Description   Final    URINE, CATHETERIZED Performed at [facility], [address] [place], [place] [address]    Special Requests   Final    NONE Performed at [facility], [address], [place], [place] [address]    Culture   Final    Two isolates with different morphologies were identified as the same organism.The most resistant organism was reported. 70,000 COLONIES/mL KLEBSIELLA PNEUMONIAE Confirmed Extended Spectrum Beta-Lactamase Producer (ESBL).  In bloodstream infections from ESBL organisms, carbapenems are preferred over piperacillin/tazobactam. They are shown to have a lower risk of mortality.    Report Status 01/07/2024 FINAL  Final   Organism ID, Bacteria KLEBSIELLA PNEUMONIAE (A)  Final      Susceptibility    Klebsiella pneumoniae - MIC*    AMPICILLIN >=32 RESISTANT Resistant     CEFAZOLIN >=64 RESISTANT Resistant     CEFTRIAXONE  >=64 RESISTANT Resistant     CIPROFLOXACIN  >=4 RESISTANT Resistant     GENTAMICIN <=1 SENSITIVE Sensitive     IMIPENEM <=0.25 SENSITIVE Sensitive     NITROFURANTOIN  >=512 RESISTANT Resistant     TRIMETH /SULFA  >=320 RESISTANT Resistant     AMPICILLIN/SULBACTAM >=32 RESISTANT Resistant     * 70,000 COLONIES/mL KLEBSIELLA PNEUMONIAE  C Difficile Quick Screen w PCR reflex     Status: None   Collection Time: 01/06/24  5:45 AM   Specimen: STOOL  Result Value Ref Range Status   C Diff antigen NEGATIVE NEGATIVE Final   C Diff toxin NEGATIVE NEGATIVE Final   C Diff interpretation No C. difficile detected.  Final    Comment: Performed at [facility], [address], [place], [place] [address]  Gastrointestinal Panel by PCR , Stool     Status: None   Collection Time: 01/06/24  5:45 AM   Specimen: Stool  Result Value Ref Range Status   Campylobacter species NOT DETECTED NOT DETECTED Final   Plesimonas shigelloides NOT DETECTED NOT DETECTED Final  Salmonella species NOT DETECTED NOT DETECTED Final   Yersinia enterocolitica NOT DETECTED NOT DETECTED Final   Vibrio species NOT DETECTED NOT DETECTED Final   Vibrio cholerae NOT DETECTED NOT DETECTED Final   Enteroaggregative E coli (EAEC) NOT DETECTED NOT DETECTED Final   Enteropathogenic E coli (EPEC) NOT DETECTED NOT DETECTED Final   Enterotoxigenic E coli (ETEC) NOT DETECTED NOT DETECTED Final   Shiga like toxin producing E coli (STEC) NOT DETECTED NOT DETECTED Final   Shigella/Enteroinvasive E coli (EIEC) NOT DETECTED NOT DETECTED Final   Cryptosporidium NOT DETECTED NOT DETECTED Final   Cyclospora cayetanensis NOT DETECTED NOT DETECTED Final   Entamoeba histolytica NOT DETECTED NOT DETECTED Final   Giardia lamblia NOT DETECTED NOT DETECTED Final   Adenovirus F40/41 NOT DETECTED NOT DETECTED Final   Astrovirus NOT  DETECTED NOT DETECTED Final   Norovirus GI/GII NOT DETECTED NOT DETECTED Final   Rotavirus A NOT DETECTED NOT DETECTED Final   Sapovirus (I, II, IV, and V) NOT DETECTED NOT DETECTED Final    Comment: Performed at [facility], [address], [place], [place] [address]   Creatinine: Recent Labs    01/04/24 1935 01/05/24 0434 01/06/24 0810 01/07/24 0427  CREATININE 1.96* 1.73* 1.44* 1.31*   Baseline Creatinine: 1.3  Impression/Assessment:  78yo with acte cystitis and mild bilateral hydronephrosis  Plan:  UTI: Patient showed have 14 days of culture specific antibiotics given the CT finding of bilateral mild hydronephrosis in the setting of UTI Bilateral hydronephrosis: I discussed the natural hx of hydronephrosis and the various causes. Her hydronephrosis is related to infection and should improve with antibiotic therapy. She will followup in my office 3-4 weeks after discharge with a repeat CT stone study  Robin Blackburn 01/07/2024, 2:50 PM

## 2024-01-07 NOTE — Progress Notes (Signed)
 Pt is refusing CT Scan unless it can be done without contrast. She has an allergy to the contrast, as well as allergy to cortisone.

## 2024-01-07 NOTE — TOC Progression Note (Addendum)
 Transition of Care [facility]) - Progression Note    Patient Details  Name: Robin Blackburn MRN: [id number] Date of Birth: [birth year]  Transition of Care [facility]) CM/SW Contact  Sharlyne Stabs, RN Phone Number: 01/07/2024, 10:24 AM  Clinical Narrative:   CM at the bedside to review bed offers. Patient wanted TOC to sent out for other bed offers. She will DC with SNF and INS AUTH, but will need LTC bed. She has been at [facility] for 5 years. Wants to change but also afraid of change. [facility], but CM advised patient it will not be a private room when [employer] expires. Other offers give to POA for them to discuss at a later date.  Patient plans to return to [facility]. MD aware to order PT, TOC will start INS auth. TOC following.  Addendum: PT note completed, TOC CMA starting Auth  Expected Discharge Plan: Long Term Acute Care (LTAC) Barriers to Discharge: Continued Medical Work up  Expected Discharge Plan and Services       Living arrangements for the past 2 months: Skilled Nursing Facility                   Social Drivers of Health (SDOH) Interventions SDOH Screenings   Food Insecurity: No Food Insecurity (01/04/2024)  Housing: Low Risk  (01/04/2024)  Transportation Needs: No Transportation Needs (01/04/2024)  Utilities: Not At Risk (01/04/2024)  Alcohol  Screen: Low Risk  (01/06/2022)  Depression (PHQ2-9): Low Risk  (12/15/2023)  Recent Concern: Depression (PHQ2-9) - Medium Risk (10/13/2023)  Financial Resource Strain: Low Risk  (01/06/2022)  Physical Activity: Inactive (11/07/2022)   Received from [facility]  Social Connections: Socially Isolated (01/04/2024)  Stress: No Stress Concern Present (01/06/2022)  Tobacco Use: Low Risk  (01/04/2024)    Readmission Risk Interventions    01/05/2024   10:44 AM  Readmission Risk Prevention Plan  Transportation Screening Complete  PCP or Specialist Appt within 5-7 Days Not Complete  Home Care Screening Complete  Medication Review  (RN CM) Complete

## 2024-01-08 DIAGNOSIS — G894 Chronic pain syndrome: Secondary | ICD-10-CM | POA: Diagnosis not present

## 2024-01-08 DIAGNOSIS — Z1612 Extended spectrum beta lactamase (ESBL) resistance: Secondary | ICD-10-CM | POA: Diagnosis not present

## 2024-01-08 DIAGNOSIS — A499 Bacterial infection, unspecified: Secondary | ICD-10-CM | POA: Diagnosis not present

## 2024-01-08 DIAGNOSIS — N3001 Acute cystitis with hematuria: Secondary | ICD-10-CM | POA: Diagnosis not present

## 2024-01-08 LAB — BASIC METABOLIC PANEL WITH GFR
Anion gap: 11 (ref 5–15)
BUN: 25 mg/dL — ABNORMAL HIGH (ref 8–23)
CO2: 24 mmol/L (ref 22–32)
Calcium: 8.5 mg/dL — ABNORMAL LOW (ref 8.9–10.3)
Chloride: 103 mmol/L (ref 98–111)
Creatinine, Ser: 1.29 mg/dL — ABNORMAL HIGH (ref 0.44–1.00)
GFR, Estimated: 42 mL/min — ABNORMAL LOW (ref >60)
Glucose, Bld: 114 mg/dL — ABNORMAL HIGH (ref 70–99)
Potassium: 3.5 mmol/L (ref 3.5–5.1)
Sodium: 138 mmol/L (ref 135–145)

## 2024-01-08 NOTE — Plan of Care (Signed)
   Problem: Education: Goal: Knowledge of General Education information will improve Description Including pain rating scale, medication(s)/side effects and non-pharmacologic comfort measures Outcome: Progressing   Problem: Health Behavior/Discharge Planning: Goal: Ability to manage health-related needs will improve Outcome: Progressing

## 2024-01-08 NOTE — Progress Notes (Signed)
 PROGRESS NOTE   Robin Blackburn  FMW:[id number] DOB: February 08, 1946 DOA: 01/04/2024 PCP: Bertell Satterfield, MD   Chief Complaint  Patient presents with   Altered Mental Status   Level of care: Med-Surg  Brief Admission History:  78 y.o. female with medical history significant of prior stroke, depression, rheumatoid arthritis who presents emergency department due to headache and confusion.  Patient was at nursing home and was notably more confused.  She has had issues with recurrent urinary tract infections and was on Macrobid .  Due to mental status changes she was brought to the ER for further assessment.   ED: Was afebrile and hemodynamically stable.  Labs were obtained on presentation which showed WBC 19.0, hemoglobin 7.9, platelets 573, sodium 134, creatinine 1.96 baseline around 1, urinalysis positive for infection.  Due to extensive drug allergies patient was started on Cipro .  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  It was discussed with patient and she was unaware of any specific medications that she had allergies to.  She states she has not had anaphylaxis to any antibiotics that she is aware of.  She was alert and oriented and able to converse.  She is complaining by nursing home and wants to move to a different one.    Assessment and Plan:  Acute infection encephalopathy most secondary to urinary tract infection - Patient was on nitrofurantoin  prior to arrival - Last urine cultures were September 2024 with E. coli/Enterococcus faecalis with sensitivity to Cipro .  And E. coli resistance to Cipro .   Switched Cipro  to>> Meropenem  due to E. coli resistance UA: Moderate hemoglobin, leukocyte Estrace , nitrites, many bacteria, WBC >50 Follow-up blood cultures - NGTD Follow-up urine cultures - Gram neg rods reported; awaiting ID and sensitivities, klebsiella pneumonia species  reported Resistant to everything except gentamicin and meropenem , continue meropenem     Acute on chronic anemia-due to anemia of chronic disease and severe iron deficiency - Drop in hemoglobin noted---requiring transfusion 2 units PRBC on 7/29 - Held Brilinta , DVT prophylaxis with Lovenox  - Pt tolerated 2U PRBC blood transfusion - iron studies consistent with iron deficiency, Hemoccult testing negative        Latest Ref Rng & Units 01/05/2024    8:03 AM 01/05/2024    4:34 AM 01/04/2024    7:35 PM  CBC  WBC 4.0 - 10.5 K/uL   16.1  19.0   Hemoglobin 12.0 - 15.0 g/dL 6.3  6.7  7.9   Hematocrit 36.0 - 46.0 % 20.9  21.7  26.4   Platelets 150 - 400 K/uL   464  573     Per patient and POA; had a recent blood transfusion approximate 2 weeks ago Iron/TIBC/Ferritin/ %Sat Labs (Brief)          Component Value Date/Time    IRON 10 (L) 01/05/2024 0803    TIBC 152 (L) 01/05/2024 0803    FERRITIN 125 12/23/2023 1355    IRONPCTSAT 7 (L) 01/05/2024 0803      Withholding IV iron due to active infection--will continue with supplement May need IV iron before discharge - did receive iron load with 2 units PRBC transfused    Generalized anxiety -continue Xanax    Hypertension -hypotensive, holding amlodipine , reducing metoprolol  dose   Chronic pain Chronic Back Pain -continue gabapentin  -pt complains of severe back pain since Fall in May 2025 -pt requesting imaging of back due to ongoing severe pain; no cauda equina symptoms -  MRI ordered of spine -- no acute findings in the spine  Right hydroureteronephrosis -- there was concern by radiologist that a mass was present. Pt updated with findings and urology consultation.  -- I consulted Dr. Sherrilee with urology dept and he agreed to consult on patient and says he is familiar with patient -- CT  urogram was ordered however patient refusing study as she has history of dye allergy that reportedly was severe -- further recommendations pending  urology consultation -- pt  had a CT stone study with findings suggesting infection and cystitis is cause of the ureterohydronephrosis and Dr. Sherrilee is recommending 14 day course of culture specific antibiotics to complete therapy and treatment for this -- ordering Midline cath placement and plan for 14 full days of therapy with culture specific antibiotics.    Rheumatoid arthritis -continue Arava    History of stroke -continue Brilinta ;  continuing statins   Insomnia -continue trazodone    Chronic severe debility -bedbound-once stable reevaluated by PT OT  DVT prophylaxis: SCDs Code Status: Full  Communication: POA at bedside updated  Disposition: return to [employer]:  PT/OT Procedures:   Antimicrobials:    Subjective:. Complains of weakness, left side pain and discomfort today.   Objective: Vitals:   01/07/24 1416 01/07/24 1918 01/08/24 0351 01/08/24 1300  BP: (!) 146/61 136/60 (!) 150/60 103/84  Pulse: 89 85 83 92  Resp: 18 16 18 18   Temp: 98.1 F (36.7 C) 98.8 F (37.1 C) 99 F (37.2 C) 98.1 F (36.7 C)  TempSrc: Oral Oral Oral Oral  SpO2: 98%  92% 100%  Weight:      Height:        Intake/Output Summary (Last 24 hours) at 01/08/2024 1503 Last data filed at 01/08/2024 0019 Gross per 24 hour  Intake 320.72 ml  Output 200 ml  Net 120.72 ml   Filed Weights   01/04/24 1836 01/04/24 2313  Weight: 68.9 kg 72.4 kg   Examination:  General exam: Appears calm and comfortable  Respiratory system: Clear to auscultation. Respiratory effort normal. Cardiovascular system: normal S1 & S2 heard. No JVD, murmurs, rubs, gallops or clicks. No pedal edema. Gastrointestinal system: Abdomen is nondistended, soft and nontender. No organomegaly or masses felt. Normal bowel sounds heard. Central nervous system: Alert and oriented. No focal neurological deficits. Extremities: Symmetric 5 x 5 power. Skin: No rashes, lesions or ulcers. Psychiatry: Judgement and  insight appear normal. Mood & affect appropriate.   Data Reviewed: I have personally reviewed following labs and imaging studies  CBC: Recent Labs  Lab 01/04/24 1935 01/05/24 0434 01/05/24 0803 01/05/24 1848 01/06/24 0810 01/07/24 0427  WBC 19.0* 16.1*  --   --  13.6* 9.5  NEUTROABS 15.7*  --   --   --  9.7* 5.7  HGB 7.9* 6.7* 6.3* 10.2* 9.1* 9.7*  HCT 26.4* 21.7* 20.9* 32.7* 28.6* 29.7*  MCV 101.1* 101.4*  --   --  96.0 95.8  PLT 573* 464*  --   --  407* 429*    Basic Metabolic Panel: Recent Labs  Lab 01/04/24 1935 01/05/24 0434 01/06/24 0810 01/07/24 0427 01/08/24 0432  NA 134* 134* 133* 137 138  K 4.9 4.4 4.2 3.6 3.5  CL 103 105 106 106 103  CO2 20* 20* 21* 21* 24  GLUCOSE 138* 118* 110* 105* 114*  BUN 25* 24* 23 24* 25*  CREATININE 1.96* 1.73* 1.44* 1.31* 1.29*  CALCIUM  8.3* 7.7* 7.9* 7.9* 8.5*  MG  --   --   --  1.8  --     CBG: No results for input(s): GLUCAP in the last 168 hours.  Recent Results (from the past 240 hours)  Urine Culture     Status: Abnormal   Collection Time: 01/04/24  8:17 PM   Specimen: Urine, Catheterized  Result Value Ref Range Status   Specimen Description   Final    URINE, CATHETERIZED Performed at [facility], [address], [place], [place] [address]    Special Requests   Final    NONE Performed at [facility], [address] [place], [place] [address]    Culture   Final    Two isolates with different morphologies were identified as the same organism.The most resistant organism was reported. 70,000 COLONIES/mL KLEBSIELLA PNEUMONIAE Confirmed Extended Spectrum Beta-Lactamase Producer (ESBL).  In bloodstream infections from ESBL organisms, carbapenems are preferred over piperacillin/tazobactam. They are shown to have a lower risk of mortality.    Report Status 01/07/2024 FINAL  Final   Organism ID, Bacteria KLEBSIELLA PNEUMONIAE (A)  Final      Susceptibility   Klebsiella pneumoniae - MIC*    AMPICILLIN >=32 RESISTANT  Resistant     CEFAZOLIN >=64 RESISTANT Resistant     CEFTRIAXONE  >=64 RESISTANT Resistant     CIPROFLOXACIN  >=4 RESISTANT Resistant     GENTAMICIN <=1 SENSITIVE Sensitive     IMIPENEM <=0.25 SENSITIVE Sensitive     NITROFURANTOIN  >=512 RESISTANT Resistant     TRIMETH /SULFA  >=320 RESISTANT Resistant     AMPICILLIN/SULBACTAM >=32 RESISTANT Resistant     * 70,000 COLONIES/mL KLEBSIELLA PNEUMONIAE  C Difficile Quick Screen w PCR reflex     Status: None   Collection Time: 01/06/24  5:45 AM   Specimen: STOOL  Result Value Ref Range Status   C Diff antigen NEGATIVE NEGATIVE Final   C Diff toxin NEGATIVE NEGATIVE Final   C Diff interpretation No C. difficile detected.  Final    Comment: Performed at [facility], [address], [place], [place] [address]  Gastrointestinal Panel by PCR , Stool     Status: None   Collection Time: 01/06/24  5:45 AM   Specimen: Stool  Result Value Ref Range Status   Campylobacter species NOT DETECTED NOT DETECTED Final   Plesimonas shigelloides NOT DETECTED NOT DETECTED Final   Salmonella species NOT DETECTED NOT DETECTED Final   Yersinia enterocolitica NOT DETECTED NOT DETECTED Final   Vibrio species NOT DETECTED NOT DETECTED Final   Vibrio cholerae NOT DETECTED NOT DETECTED Final   Enteroaggregative E coli (EAEC) NOT DETECTED NOT DETECTED Final   Enteropathogenic E coli (EPEC) NOT DETECTED NOT DETECTED Final   Enterotoxigenic E coli (ETEC) NOT DETECTED NOT DETECTED Final   Shiga like toxin producing E coli (STEC) NOT DETECTED NOT DETECTED Final   Shigella/Enteroinvasive E coli (EIEC) NOT DETECTED NOT DETECTED Final   Cryptosporidium NOT DETECTED NOT DETECTED Final   Cyclospora cayetanensis NOT DETECTED NOT DETECTED Final   Entamoeba histolytica NOT DETECTED NOT DETECTED Final   Giardia lamblia NOT DETECTED NOT DETECTED Final   Adenovirus F40/41 NOT DETECTED NOT DETECTED Final   Astrovirus NOT DETECTED NOT DETECTED Final   Norovirus GI/GII NOT DETECTED  NOT DETECTED Final   Rotavirus A NOT DETECTED NOT DETECTED Final   Sapovirus (I, II, IV, and V) NOT DETECTED NOT DETECTED Final    Comment: Performed at [facility], [address] [place], [place] [address]     Radiology Studies: CT RENAL STONE STUDY Result Date: 01/07/2024  CLINICAL DATA:  Bladder diverticulum. EXAM: CT ABDOMEN AND PELVIS WITHOUT CONTRAST TECHNIQUE: Multidetector CT imaging of the abdomen and pelvis was performed following the standard protocol without IV contrast. RADIATION DOSE REDUCTION: This exam was performed according to the departmental dose-optimization program which includes automated exposure control, adjustment of the mA and/or kV according to patient size and/or use of iterative reconstruction technique. COMPARISON:  CT scan renal stone protocol from 03/12/2022 and CT urogram from 08/14/2023. FINDINGS: Lower chest: There are patchy atelectatic changes in the visualized lung bases. No overt consolidation. No pleural effusion. The heart is normal in size. No pericardial effusion. Hepatobiliary: The liver is normal in size. Non-cirrhotic configuration. No suspicious mass. No intrahepatic or extrahepatic bile duct dilation. Gallbladder is surgically absent. Pancreas: Unremarkable. No pancreatic ductal dilatation or surrounding inflammatory changes. Spleen: Within normal limits. No focal lesion. Adrenals/Urinary Tract: Adrenal glands are unremarkable. No suspicious renal mass within the limitations of this unenhanced exam. Bilateral extrarenal pelves noted. There is bilateral mild hydronephrosis. Mild bilateral urothelial thickening noted. Physiologically distended urinary bladder. There is mild diffuse urinary bladder wall thickening and mild perivesical fat stranding, concerning for cystitis. Correlate clinically and with urinalysis. Bladder wall trabeculations and pseudo-diverticula noted, suggesting sequela of chronic urinary outflow obstruction. No bladder calculi or  focal mass. Stomach/Bowel: No disproportionate dilation of the small or large bowel loops. No evidence of abnormal bowel wall thickening or inflammatory changes. The appendix was not visualized; however there is no acute inflammatory process in the right lower quadrant. There are multiple diverticula mainly in the sigmoid colon, without imaging signs of diverticulitis. Vascular/Lymphatic: No ascites or pneumoperitoneum. No abdominal or pelvic lymphadenopathy, by size criteria. No aneurysmal dilation of the major abdominal arteries. There are mild peripheral atherosclerotic vascular calcifications of the aorta and its major branches. Reproductive: The uterus is surgically absent. No large adnexal mass. Other: There is a tiny fat containing umbilical hernia. The soft tissues and abdominal wall are otherwise unremarkable. Musculoskeletal: No suspicious osseous lesions. There are moderate multilevel degenerative changes in the visualized spine. 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 IMPRESSION: 1. There is mild diffuse urinary bladder wall thickening and mild perivesical fat stranding, concerning for cystitis. Correlate clinically and with urinalysis. There is mild bilateral hydronephrosis and urothelial thickening, which may be secondary to ascending urinary tract infection. No nephroureterolithiasis. No focal urinary bladder mass or bladder calculus. 2. Multiple other nonacute observations, as described above. Aortic Atherosclerosis (ICD10-I70.0). Electronically Signed   By: Ree Molt M.D.   On: 01/07/2024 13:18    Scheduled Meds:  cycloSPORINE   1 drop Both Eyes BID   gabapentin   100 mg Oral TID   leflunomide   10 mg Oral Daily   methenamine   1,000 mg Oral QID   metoprolol  tartrate  12.5 mg Oral BID   ticagrelor   60 mg Oral Daily   torsemide   20 mg Oral Daily   traZODone   100 mg Oral QHS   Continuous Infusions:  meropenem  (MERREM ) IV 1 g (01/08/24 1446)     LOS: 4 days   Time spent: 55  mins  Jazzlynn Rawe Vicci, MD How to contact the [facility] Attending or Consulting provider 7A - 7P or covering provider during after hours 7P -7A, for this patient?  Check the care team in [facility] and look for a) attending/consulting [facility] provider listed and b) the [facility] team listed Log into [email or web address] to find provider on call.  Locate the [facility] provider you are looking for under [facility] and page  to a number that you can be directly reached. If you still have difficulty reaching the provider, please page the [facility] (Director on Call) for the Hospitalists listed on amion for assistance.  01/08/2024, 3:03 PM

## 2024-01-08 NOTE — Plan of Care (Signed)

## 2024-01-08 NOTE — Care Management Important Message (Signed)
 Important Message  Patient Details  Name: Robin Blackburn MRN: [id number] Date of Birth: 04/07/1946   Important Message Given:  Yes - Medicare IM     Manmeet Arzola L Alaisa Moffitt 01/08/2024, 3:46 PM

## 2024-01-09 ENCOUNTER — Inpatient Hospital Stay (HOSPITAL_COMMUNITY)

## 2024-01-09 ENCOUNTER — Other Ambulatory Visit: Payer: Self-pay

## 2024-01-09 DIAGNOSIS — I517 Cardiomegaly: Secondary | ICD-10-CM | POA: Diagnosis not present

## 2024-01-09 DIAGNOSIS — M6281 Muscle weakness (generalized): Secondary | ICD-10-CM | POA: Diagnosis not present

## 2024-01-09 DIAGNOSIS — R278 Other lack of coordination: Secondary | ICD-10-CM | POA: Diagnosis not present

## 2024-01-09 DIAGNOSIS — N3001 Acute cystitis with hematuria: Secondary | ICD-10-CM | POA: Diagnosis not present

## 2024-01-09 DIAGNOSIS — Z743 Need for continuous supervision: Secondary | ICD-10-CM | POA: Diagnosis not present

## 2024-01-09 DIAGNOSIS — N390 Urinary tract infection, site not specified: Secondary | ICD-10-CM | POA: Diagnosis not present

## 2024-01-09 DIAGNOSIS — R0781 Pleurodynia: Secondary | ICD-10-CM | POA: Diagnosis not present

## 2024-01-09 DIAGNOSIS — Z452 Encounter for adjustment and management of vascular access device: Secondary | ICD-10-CM | POA: Diagnosis not present

## 2024-01-09 DIAGNOSIS — A499 Bacterial infection, unspecified: Secondary | ICD-10-CM | POA: Diagnosis not present

## 2024-01-09 DIAGNOSIS — G4700 Insomnia, unspecified: Secondary | ICD-10-CM | POA: Diagnosis not present

## 2024-01-09 DIAGNOSIS — R531 Weakness: Secondary | ICD-10-CM | POA: Diagnosis not present

## 2024-01-09 DIAGNOSIS — I959 Hypotension, unspecified: Secondary | ICD-10-CM | POA: Diagnosis not present

## 2024-01-09 DIAGNOSIS — F419 Anxiety disorder, unspecified: Secondary | ICD-10-CM | POA: Diagnosis not present

## 2024-01-09 DIAGNOSIS — F32A Depression, unspecified: Secondary | ICD-10-CM | POA: Diagnosis not present

## 2024-01-09 DIAGNOSIS — D649 Anemia, unspecified: Secondary | ICD-10-CM | POA: Diagnosis present

## 2024-01-09 DIAGNOSIS — N136 Pyonephrosis: Secondary | ICD-10-CM | POA: Diagnosis not present

## 2024-01-09 DIAGNOSIS — G894 Chronic pain syndrome: Secondary | ICD-10-CM

## 2024-01-09 DIAGNOSIS — M069 Rheumatoid arthritis, unspecified: Secondary | ICD-10-CM | POA: Diagnosis not present

## 2024-01-09 DIAGNOSIS — J9811 Atelectasis: Secondary | ICD-10-CM | POA: Diagnosis not present

## 2024-01-09 DIAGNOSIS — I10 Essential (primary) hypertension: Secondary | ICD-10-CM | POA: Diagnosis not present

## 2024-01-09 DIAGNOSIS — R059 Cough, unspecified: Secondary | ICD-10-CM | POA: Diagnosis not present

## 2024-01-09 DIAGNOSIS — D509 Iron deficiency anemia, unspecified: Secondary | ICD-10-CM | POA: Diagnosis not present

## 2024-01-09 DIAGNOSIS — Z1612 Extended spectrum beta lactamase (ESBL) resistance: Secondary | ICD-10-CM | POA: Diagnosis not present

## 2024-01-09 DIAGNOSIS — Z9119 Patient's noncompliance with other medical treatment and regimen due to financial hardship: Secondary | ICD-10-CM | POA: Diagnosis not present

## 2024-01-09 DIAGNOSIS — Z7401 Bed confinement status: Secondary | ICD-10-CM | POA: Diagnosis not present

## 2024-01-09 LAB — BASIC METABOLIC PANEL WITH GFR
Anion gap: 11 (ref 5–15)
BUN: 25 mg/dL — ABNORMAL HIGH (ref 8–23)
CO2: 24 mmol/L (ref 22–32)
Calcium: 8.3 mg/dL — ABNORMAL LOW (ref 8.9–10.3)
Chloride: 103 mmol/L (ref 98–111)
Creatinine, Ser: 1.18 mg/dL — ABNORMAL HIGH (ref 0.44–1.00)
GFR, Estimated: 47 mL/min — ABNORMAL LOW (ref >60)
Glucose, Bld: 112 mg/dL — ABNORMAL HIGH (ref 70–99)
Potassium: 3.3 mmol/L — ABNORMAL LOW (ref 3.5–5.1)
Sodium: 138 mmol/L (ref 135–145)

## 2024-01-09 MED ORDER — SODIUM CHLORIDE 0.9 % IV SOLN
1.0000 g | Freq: Once | INTRAVENOUS | Status: AC
  Administered 2024-01-09: 1 g via INTRAVENOUS
  Filled 2024-01-09: qty 1000

## 2024-01-09 MED ORDER — CHLORHEXIDINE GLUCONATE CLOTH 2 % EX PADS: 6.0000 | MEDICATED_PAD | Freq: Every day | CUTANEOUS | Status: DC

## 2024-01-09 MED ORDER — SODIUM CHLORIDE 0.9% FLUSH: 10.0000 mL | INTRAVENOUS | Status: DC | PRN

## 2024-01-09 MED ORDER — METOPROLOL TARTRATE 25 MG PO TABS: 12.5000 mg | ORAL_TABLET | Freq: Two times a day (BID) | ORAL | Status: AC

## 2024-01-09 MED ORDER — GABAPENTIN 300 MG PO CAPS: 300.0000 mg | ORAL_CAPSULE | Freq: Three times a day (TID) | ORAL | 0 refills | Status: DC | PRN

## 2024-01-09 MED ORDER — ERTAPENEM IV (FOR PTA / DISCHARGE USE ONLY): 1.0000 g | INTRAVENOUS | 0 refills | Status: AC

## 2024-01-09 MED ORDER — TRAMADOL HCL 50 MG PO TABS: 50.0000 mg | ORAL_TABLET | Freq: Three times a day (TID) | ORAL | 0 refills | Status: DC | PRN

## 2024-01-09 MED ORDER — POTASSIUM CHLORIDE CRYS ER 10 MEQ PO TBCR: 10.0000 meq | EXTENDED_RELEASE_TABLET | Freq: Every day | ORAL | Status: DC

## 2024-01-09 MED ORDER — METHENAMINE HIPPURATE 1 G PO TABS: 1.0000 g | ORAL_TABLET | Freq: Two times a day (BID) | ORAL | Status: AC

## 2024-01-09 MED ORDER — ALPRAZOLAM 0.5 MG PO TABS: 0.5000 mg | ORAL_TABLET | Freq: Two times a day (BID) | ORAL | 0 refills | Status: DC | PRN

## 2024-01-09 MED ORDER — POTASSIUM CHLORIDE CRYS ER 20 MEQ PO TBCR
40.0000 meq | EXTENDED_RELEASE_TABLET | Freq: Once | ORAL | Status: AC
  Administered 2024-01-09: 40 meq via ORAL
  Filled 2024-01-09: qty 2

## 2024-01-09 MED ORDER — ESTRADIOL 0.1 MG/GM VA CREA: 1.0000 | TOPICAL_CREAM | VAGINAL | Status: AC

## 2024-01-09 MED ORDER — SODIUM CHLORIDE 0.9% FLUSH
10.0000 mL | Freq: Two times a day (BID) | INTRAVENOUS | Status: DC
  Administered 2024-01-09: 10 mL

## 2024-01-09 MED ORDER — POTASSIUM CHLORIDE CRYS ER 20 MEQ PO TBCR: 40.0000 meq | EXTENDED_RELEASE_TABLET | Freq: Every day | ORAL | Status: DC

## 2024-01-09 NOTE — Progress Notes (Signed)
 Peripherally Inserted Central Catheter Placement  The IV Nurse has discussed with the patient and/or persons authorized to consent for the patient, the purpose of this procedure and the potential benefits and risks involved with this procedure.  The benefits include less needle sticks, lab draws from the catheter, and the patient may be discharged home with the catheter. Risks include, but not limited to, infection, bleeding, blood clot (thrombus formation), and puncture of an artery; nerve damage and irregular heartbeat and possibility to perform a PICC exchange if needed/ordered by physician.  Alternatives to this procedure were also discussed.  Bard Power PICC patient education guide, fact sheet on infection prevention and patient information card has been provided to patient /or left at bedside. Pt repeatedly requested PIC line over midline.  Dr vicci changed order per pt request   PICC Placement Documentation  PICC Single Lumen 01/09/24 Right Brachial 35 cm 0 cm (Active)  Indication for Insertion or Continuance of Line Home intravenous therapies (PICC only) 01/09/24 1051  Exposed Catheter (cm) 0 cm 01/09/24 1051  Site Assessment Clean, Dry, Intact 01/09/24 1051  Line Status Flushed;Saline locked;Blood return noted 01/09/24 1051  Dressing Type Transparent;Securing device 01/09/24 1051  Dressing Status Antimicrobial disc/dressing in place;Clean, Dry, Intact 01/09/24 1051  Line Care Connections checked and tightened 01/09/24 1051  Line Adjustment (NICU/IV Team Only) No 01/09/24 1051  Dressing Intervention New dressing;Adhesive placed at insertion site (IV team only);Adhesive placed around edges of dressing (IV team/ICU RN only) 01/09/24 1051  Dressing Change Due 01/16/24 01/09/24 1051       Robin Blackburn 01/09/2024, 10:52 AM

## 2024-01-09 NOTE — Discharge Summary (Addendum)
 Physician Discharge Summary  Robin Blackburn FMW:[id number] DOB: 1946-02-14 DOA: 01/04/2024  PCP: Bertell Satterfield, MD  Admit date: 01/04/2024 Discharge date: 01/09/2024  Admitted From:  SNF Disposition: SNF  Recommendations for Outpatient Follow-up:  Follow up with PCP in 1 weeks Please obtain BMP/CBC in 1 week Please follow OPAT orders to complete full course of IV ertapenem   Discharge Condition: STABLE   CODE STATUS: FULL DIET: regular    Brief Hospitalization Summary: Please see all hospital notes, images, labs for full details of the hospitalization. Admission provider HPI:  78 y.o. female with medical history significant of prior stroke, depression, rheumatoid arthritis who presents emergency department due to headache and confusion.  Patient was at nursing home and was notably more confused.  She has had issues with recurrent urinary tract infections and was on Macrobid .  Due to mental status changes she was brought to the ER for further assessment.   ED: Was afebrile and hemodynamically stable.  Labs were obtained on presentation which showed WBC 19.0, hemoglobin 7.9, platelets 573, sodium 134, creatinine 1.96 baseline around 1, urinalysis positive for infection.  Due to extensive drug allergies patient was started on Cipro .  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  It was discussed with patient and she was unaware of any specific medications that she had allergies to.  She states she has not had anaphylaxis to any antibiotics that she is aware of.  She was alert and oriented and able to converse.  She is complaining by nursing home and wants to move to a different one.   Hospital Course by listed problems addressed  Acute infection encephalopathy secondary to ESBL urinary tract infection klebsiella pneumonia -- Encephalopathy has resolved  - Last urine cultures were September 2024  with E. coli/Enterococcus faecalis with sensitivity to Cipro .  And E. coli resistance to Cipro .   Switched Cipro  to>> Meropenem  due to E. coli resistance  UA: Moderate hemoglobin, leukocyte Estrace , nitrites, many bacteria, WBC >50 Follow-up blood cultures - NGTD Follow-up urine cultures - Gram neg rods reported; klebsiella pneumonia species reported Resistant to everything except gentamicin and meropenem , continue meropenem     Acute on chronic anemia-due to anemia of chronic disease and severe iron deficiency - Drop in hemoglobin noted---requiring transfusion 2 units PRBC on 7/29 - Held Brilinta , DVT prophylaxis with Lovenox  - Pt tolerated 2U PRBC blood transfusion - iron studies consistent with iron deficiency, Hemoccult testing negative     Did not give IV iron due to active infection--will continue with supplement did receive iron load with 2 units PRBC transfused    Generalized anxiety -continue Xanax    Hypertension - resolved  - reduced metoprolol  dose   Chronic pain Chronic Back Pain -continue gabapentin  -pt complains of severe back pain since Fall in May 2025 -pt requesting imaging of back due to ongoing severe pain; no cauda equina symptoms -MRI ordered of cervical, thoracic, lumbar spine -- no acute findings in the spine --xrays ordered 8/2 to rule out rib fracture -- study negative for any acute findings   Right hydroureteronephrosis -- there was concern by radiologist that a mass was present. Pt updated with findings and urology consultation.  -- I consulted Dr. Sherrilee with urology dept and he agreed to consult on patient and says he is familiar with patient -- CT  urogram was ordered however patient refusing study as she has history of dye allergy that reportedly was severe --  further recommendations pending urology consultation -- pt  had a CT stone study with findings suggesting infection and cystitis is cause of the ureterohydronephrosis and Dr. Sherrilee is  recommending 14 day course of culture specific antibiotics to complete therapy and treatment for this -- ordering Midline cath placement and plan for 14 full days of therapy with culture specific antibiotics.  -- I spoke with Dr. Fleeta Rothman of ID service and pt will get PICC line and DC on Invanz  to complete full 14 day course thru 01/18/24.     Rheumatoid arthritis -continue Arava    History of stroke -continue Brilinta ;  continuing statins   Insomnia -continue trazodone    Chronic severe debility -bedbound-once stable reevaluated by PT OT -- resume SNF care    Discharge Diagnoses:  Principal Problem:   Urinary tract infection   Discharge Instructions: Discharge Instructions     [facility] pharmacist to adjust dose for Vancomycin, Aminoglycosides and other anti-infective therapies as requested by physician.   Complete by: As directed    [facility] to provide Cath Flo 2mg    Complete by: As directed    Administer for PICC line occlusion and as ordered by physician for other access device issues.   Ambulatory referral to Urology   Complete by: As directed    Hospital follow up in 3-4 weeks per Dr. Sherrilee   Anaphylaxis Kit: Provided to treat any anaphylactic reaction to the medication being provided to the patient if First Dose or when requested by physician   Complete by: As directed    Epinephrine 1mg /ml vial / amp: Administer 0.3mg  (0.16ml) subcutaneously once for moderate to severe anaphylaxis, nurse to call physician and pharmacy when reaction occurs and call 911 if needed for immediate care   Diphenhydramine  50mg /ml IV vial: Administer 25-50mg  IV/IM PRN for first dose reaction, rash, itching, mild reaction, nurse to call physician and pharmacy when reaction occurs   Sodium Chloride  0.9% NS 500ml IV: Administer if needed for hypovolemic blood pressure drop or as ordered by physician after call to physician with anaphylactic reaction   Change dressing on IV  access line weekly and PRN   Complete by: As directed    Flush IV access with Sodium Chloride  0.9% and Heparin  10 units/ml or 100 units/ml   Complete by: As directed    Home infusion instructions - [facility]   Complete by: As directed    Instructions: Flush IV access with Sodium Chloride  0.9% and Heparin  10units/ml or 100units/ml   Change dressing on IV access line: Weekly and PRN   Instructions Cath Flo 2mg : Administer for PICC Line occlusion and as ordered by physician for other access device   [facility] pharmacist to adjust dose for: Vancomycin, Aminoglycosides and other anti-infective therapies as requested by physician   Method of administration may be changed at the discretion of home infusion pharmacist based upon assessment of the patient and/or caregiver's ability to self-administer the medication ordered   Complete by: As directed       Allergies as of 01/09/2024       Reactions   Cortisone Anaphylaxis   Cardiovascular Arrest   Fentanyl  Anaphylaxis, Hives, Other (See Comments)   felt like I had demons in my head   Hydromorphone  Anaphylaxis, Nausea And Vomiting   GI Intolerance   Iodinated Contrast Media Anaphylaxis, Hives, Rash, Dermatitis   Respiratory Distress Iodinated contrast media (substance)   Keflex  [cephalexin ] Nausea And Vomiting   Meperidine Anaphylaxis, Shortness Of Breath  Respiratory Distress   Morphine Anaphylaxis, Nausea And Vomiting, Swelling   GI Intolerance   Oxycodone  Anaphylaxis   Oxycodone -acetaminophen  Nausea And Vomiting, Rash, Swelling, Dermatitis   GI Intolerance, Mouth swelling. acetaminophen  / oxycodone    Penicillins Anaphylaxis, Nausea And Vomiting, Other (See Comments)   Immediate rash, facial/tongue/throat swelling, SOB or lightheadedness with hypotension Product containing penicillin (product)   Afluria Preservative Free [influenza Virus Vacc Split Pf] Other (See Comments)   Unknown   Aspirin  Other (See  Comments)   GI Intolerance   Ciprofloxacin  Hcl Other (See Comments)   Unknown    Codeine Other (See Comments)   Unknown    Influenza Vaccines Other (See Comments)   Unknown    Influenza Virus Vaccine Other (See Comments)   Unknown    Iohexol Other (See Comments)   Unknown   Meperidine Hcl Other (See Comments)   Unknown   Misc. Sulfonamide Containing Compounds Other (See Comments)   Unknown   Oxycodone  Hcl Other (See Comments)   Unknown   Penicillamine Other (See Comments)   Unknown    Shellfish Allergy Other (See Comments)   Glucosamine not an option Unknown    Sulfa  Antibiotics Nausea And Vomiting   Tolerates Bactrim  though   Troleandomycin Nausea And Vomiting, Swelling   GI Intolerance   Bisphosphonates Hives, Other (See Comments)   GI intolerance   Ciprofloxacin  Nausea Only, Rash   Iodine Hives   Levaquin [levofloxacin In D5w] Nausea And Vomiting, Rash   Levofloxacin Hives, Other (See Comments)   Mental Status Changes, Confusion   Morphine And Codeine Rash   Oxytetracycline Nausea And Vomiting   Percocet [oxycodone -acetaminophen ] Swelling   Mouth swelling.   Sulfur Nausea And Vomiting, Rash, Dermatitis   GI Intolerance sulfur        Medication List     STOP taking these medications    fosfomycin 3 g Pack Commonly known as: MONUROL    ibuprofen  400 MG tablet Commonly known as: ADVIL    nitrofurantoin  (macrocrystal-monohydrate) 100 MG capsule Commonly known as: MACROBID    Nutritional Supplement Liqd   ondansetron  4 MG tablet Commonly known as: ZOFRAN        TAKE these medications    acetaminophen  500 MG tablet Commonly known as: TYLENOL  Take 1,000 mg by mouth every 6 (six) hours as needed for mild pain (pain score 1-3) or moderate pain (pain score 4-6).   ALPRAZolam  0.5 MG tablet Commonly known as: XANAX  Take 1 tablet (0.5 mg total) by mouth 2 (two) times daily as needed for anxiety. What changed:  when to take this reasons to take this    amLODipine  2.5 MG tablet Commonly known as: NORVASC  Take 2.5 mg by mouth daily.   ascorbic acid 500 MG tablet Commonly known as: VITAMIN C Take 500 mg by mouth daily.   carbamide peroxide 6.5 % OTIC solution Commonly known as: DEBROX Place 5 drops into both ears 2 (two) times daily.   chlorhexidine  0.12 % solution Commonly known as: PERIDEX  Use as directed 15 mLs in the mouth or throat 2 (two) times daily.   Cholecalciferol  125 MCG (5000 UT) Tabs Take 1 tablet by mouth daily.   Cranberry 450 MG Tabs Take 1 tablet by mouth in the morning and at bedtime. For UTI   diclofenac  Sodium 1 % Gel Commonly known as: VOLTAREN  Apply 2 g topically in the morning and at bedtime. Apply to posterior neck   dicyclomine  20 MG tablet Commonly known as: BENTYL  Take 20 mg by mouth every 6 (six)  hours. Abdominal cramps   ertapenem  IVPB Commonly known as: INVANZ  Inject 1 g into the vein daily for 8 days. Indication:  ESBL UTI First Dose: No Last Day of Therapy:  01/18/24 Labs - Once weekly:  CBC/D and BMP, Labs - Once weekly: ESR and CRP Method of administration: Mini-Bag Plus / Gravity Method of administration may be changed at the discretion of home infusion pharmacist based upon assessment of the patient and/or caregiver's ability to self-administer the medication ordered. Start taking on: January 10, 2024   estradiol  0.1 MG/GM vaginal cream Commonly known as: ESTRACE  Place 1 Applicatorful vaginally every other day. Discard plastic applicator. Insert a blueberry size amount (approximately 1 gram) of cream on fingertip inside vagina at bedtime every other night for long term use.   fluticasone 50 MCG/ACT nasal spray Commonly known as: FLONASE Place 1 spray into both nostrils in the morning and at bedtime.   folic acid  1 MG tablet Commonly known as: FOLVITE  Take 1 mg by mouth daily.   gabapentin  300 MG capsule Commonly known as: NEURONTIN  Take 1 capsule (300 mg total) by mouth 3  (three) times daily as needed (neuropathy pain symptoms). What changed:  when to take this reasons to take this   guaiFENesin 600 MG 12 hr tablet Commonly known as: MUCINEX Take 600 mg by mouth 2 (two) times daily as needed (congestion).   Hemorrhoidal Relief 5 % Crea Generic drug: Lidocaine  (Anorectal) Apply 1 Application topically every 8 (eight) hours as needed (hemorrhoids).   Humira (2 Pen) 40 MG/0.8ML Ajkt pen Generic drug: adalimumab Inject 40 mg into the skin every 14 (fourteen) days.   lansoprazole 30 MG capsule Commonly known as: PREVACID Take 60 mg by mouth 2 (two) times daily before a meal.   leflunomide  10 MG tablet Commonly known as: ARAVA  Take 10 mg by mouth daily.   lidocaine  5 % Commonly known as: LIDODERM  Place 1 patch onto the skin every 12 (twelve) hours. Remove & Discard patch within 12 hours or as directed   loperamide  2 MG tablet Commonly known as: IMODIUM  A-D Take 2 mg by mouth every 6 (six) hours as needed for diarrhea or loose stools.   meclizine  12.5 MG tablet Commonly known as: ANTIVERT  Take 12.5 mg by mouth 3 (three) times daily.   melatonin 5 MG Tabs Take 10 mg by mouth at bedtime.   methenamine  1 g tablet Commonly known as: HIPREX  Take 1 tablet (1 g total) by mouth 2 (two) times daily. Prophylactic Start taking on: January 22, 2024 What changed: These instructions start on January 22, 2024. If you are unsure what to do until then, ask your doctor or other care provider.   methocarbamol  500 MG tablet Commonly known as: ROBAXIN  Take 500 mg by mouth every 6 (six) hours as needed for muscle spasms.   metoprolol  tartrate 25 MG tablet Commonly known as: LOPRESSOR  Take 0.5 tablets (12.5 mg total) by mouth 2 (two) times daily. What changed: how much to take   phenazopyridine  200 MG tablet Commonly known as: PYRIDIUM  Take 200 mg by mouth every 8 (eight) hours as needed (urinary burning/pain).   potassium chloride  10 MEQ tablet Commonly  known as: KLOR-CON  M Take 1 tablet (10 mEq total) by mouth daily. Start taking on: January 10, 2024   PreserVision AREDS 2 Caps Take 1 capsule by mouth in the morning and at bedtime.   Probiotic (Lactobacillus) Caps Take 1 capsule by mouth in the morning, at noon, and at bedtime.  Restasis  0.05 % ophthalmic emulsion Generic drug: cycloSPORINE  Place 1 drop into both eyes 2 (two) times daily. Dry eyes   simethicone  125 MG chewable tablet Commonly known as: MYLICON Chew 125 mg by mouth every 6 (six) hours as needed for flatulence.   ticagrelor  60 MG Tabs tablet Commonly known as: BRILINTA  Take 60 mg by mouth daily.   torsemide  20 MG tablet Commonly known as: DEMADEX  Take 20 mg by mouth daily.   traMADol  50 MG tablet Commonly known as: ULTRAM  Take 1 tablet (50 mg total) by mouth every 8 (eight) hours as needed for severe pain (pain score 7-10). What changed: reasons to take this   traZODone  100 MG tablet Commonly known as: DESYREL  Take 100 mg by mouth at bedtime.   vitamin B-12 250 MCG tablet Commonly known as: CYANOCOBALAMIN Take 250 mcg by mouth daily.   vitamin E 1000 UNIT capsule Take 1,000 Units by mouth daily.               Discharge Care Instructions  (From admission, onward)           Start     Ordered   01/09/24 0000  Change dressing on IV access line weekly and PRN  (Home infusion instructions - [facility] )        01/09/24 1133            Follow-up Information     Bertell Satterfield, MD. Schedule an appointment as soon as possible for a visit in 1 week(s).   Specialty: Internal Medicine Why: Hospital Follow Up Contact information: [address] [place] [address] [phone]         [facility] [place]. Schedule an appointment as soon as possible for a visit in 1 month(s).   Why: Hospital Follow Up Contact information: [address] [place]   [address] [phone]               Allergies  Allergen Reactions   Cortisone Anaphylaxis    Cardiovascular Arrest   Fentanyl  Anaphylaxis, Hives and Other (See Comments)    felt like I had demons in my head   Hydromorphone  Anaphylaxis and Nausea And Vomiting    GI Intolerance   Iodinated Contrast Media Anaphylaxis, Hives, Rash and Dermatitis    Respiratory Distress  Iodinated contrast media (substance)   Keflex  [Cephalexin ] Nausea And Vomiting   Meperidine Anaphylaxis and Shortness Of Breath    Respiratory Distress   Morphine Anaphylaxis, Nausea And Vomiting and Swelling    GI Intolerance   Oxycodone  Anaphylaxis   Oxycodone -Acetaminophen  Nausea And Vomiting, Rash, Swelling and Dermatitis    GI Intolerance, Mouth swelling.  acetaminophen  / oxycodone    Penicillins Anaphylaxis, Nausea And Vomiting and Other (See Comments)    Immediate rash, facial/tongue/throat swelling, SOB or lightheadedness with hypotension  Product containing penicillin (product)   Afluria Preservative Free [Influenza Virus Vacc Split Pf] Other (See Comments)    Unknown   Aspirin  Other (See Comments)    GI Intolerance   Ciprofloxacin  Hcl Other (See Comments)    Unknown    Codeine Other (See Comments)    Unknown    Influenza Vaccines Other (See Comments)    Unknown    Influenza Virus Vaccine Other (See Comments)    Unknown    Iohexol Other (See Comments)    Unknown   Meperidine Hcl Other (See Comments)    Unknown   Misc. Sulfonamide Containing Compounds Other (See Comments)  Unknown   Oxycodone  Hcl Other (See Comments)    Unknown   Penicillamine Other (See Comments)    Unknown    Shellfish Allergy Other (See Comments)    Glucosamine not an option Unknown    Sulfa  Antibiotics Nausea And Vomiting    Tolerates Bactrim  though   Troleandomycin Nausea And Vomiting and Swelling    GI Intolerance   Bisphosphonates Hives and Other (See Comments)    GI intolerance   Ciprofloxacin  Nausea  Only and Rash   Iodine Hives   Levaquin [Levofloxacin In D5w] Nausea And Vomiting and Rash   Levofloxacin Hives and Other (See Comments)    Mental Status Changes, Confusion   Morphine And Codeine Rash   Oxytetracycline Nausea And Vomiting   Percocet [Oxycodone -Acetaminophen ] Swelling    Mouth swelling.   Sulfur Nausea And Vomiting, Rash and Dermatitis    GI Intolerance  sulfur   Allergies as of 01/09/2024       Reactions   Cortisone Anaphylaxis   Cardiovascular Arrest   Fentanyl  Anaphylaxis, Hives, Other (See Comments)   felt like I had demons in my head   Hydromorphone  Anaphylaxis, Nausea And Vomiting   GI Intolerance   Iodinated Contrast Media Anaphylaxis, Hives, Rash, Dermatitis   Respiratory Distress Iodinated contrast media (substance)   Keflex  [cephalexin ] Nausea And Vomiting   Meperidine Anaphylaxis, Shortness Of Breath   Respiratory Distress   Morphine Anaphylaxis, Nausea And Vomiting, Swelling   GI Intolerance   Oxycodone  Anaphylaxis   Oxycodone -acetaminophen  Nausea And Vomiting, Rash, Swelling, Dermatitis   GI Intolerance, Mouth swelling. acetaminophen  / oxycodone    Penicillins Anaphylaxis, Nausea And Vomiting, Other (See Comments)   Immediate rash, facial/tongue/throat swelling, SOB or lightheadedness with hypotension Product containing penicillin (product)   Afluria Preservative Free [influenza Virus Vacc Split Pf] Other (See Comments)   Unknown   Aspirin  Other (See Comments)   GI Intolerance   Ciprofloxacin  Hcl Other (See Comments)   Unknown    Codeine Other (See Comments)   Unknown    Influenza Vaccines Other (See Comments)   Unknown    Influenza Virus Vaccine Other (See Comments)   Unknown    Iohexol Other (See Comments)   Unknown   Meperidine Hcl Other (See Comments)   Unknown   Misc. Sulfonamide Containing Compounds Other (See Comments)   Unknown   Oxycodone  Hcl Other (See Comments)   Unknown   Penicillamine Other (See Comments)   Unknown     Shellfish Allergy Other (See Comments)   Glucosamine not an option Unknown    Sulfa  Antibiotics Nausea And Vomiting   Tolerates Bactrim  though   Troleandomycin Nausea And Vomiting, Swelling   GI Intolerance   Bisphosphonates Hives, Other (See Comments)   GI intolerance   Ciprofloxacin  Nausea Only, Rash   Iodine Hives   Levaquin [levofloxacin In D5w] Nausea And Vomiting, Rash   Levofloxacin Hives, Other (See Comments)   Mental Status Changes, Confusion   Morphine And Codeine Rash   Oxytetracycline Nausea And Vomiting   Percocet [oxycodone -acetaminophen ] Swelling   Mouth swelling.   Sulfur Nausea And Vomiting, Rash, Dermatitis   GI Intolerance sulfur        Medication List     STOP taking these medications    fosfomycin 3 g Pack Commonly known as: MONUROL    ibuprofen  400 MG tablet Commonly known as: ADVIL    nitrofurantoin  (macrocrystal-monohydrate) 100 MG capsule Commonly known as: MACROBID    Nutritional Supplement Liqd   ondansetron  4 MG tablet Commonly known as: ZOFRAN   TAKE these medications    acetaminophen  500 MG tablet Commonly known as: TYLENOL  Take 1,000 mg by mouth every 6 (six) hours as needed for mild pain (pain score 1-3) or moderate pain (pain score 4-6).   ALPRAZolam  0.5 MG tablet Commonly known as: XANAX  Take 1 tablet (0.5 mg total) by mouth 2 (two) times daily as needed for anxiety. What changed:  when to take this reasons to take this   amLODipine  2.5 MG tablet Commonly known as: NORVASC  Take 2.5 mg by mouth daily.   ascorbic acid 500 MG tablet Commonly known as: VITAMIN C Take 500 mg by mouth daily.   carbamide peroxide 6.5 % OTIC solution Commonly known as: DEBROX Place 5 drops into both ears 2 (two) times daily.   chlorhexidine  0.12 % solution Commonly known as: PERIDEX  Use as directed 15 mLs in the mouth or throat 2 (two) times daily.   Cholecalciferol  125 MCG (5000 UT) Tabs Take 1 tablet by mouth daily.    Cranberry 450 MG Tabs Take 1 tablet by mouth in the morning and at bedtime. For UTI   diclofenac  Sodium 1 % Gel Commonly known as: VOLTAREN  Apply 2 g topically in the morning and at bedtime. Apply to posterior neck   dicyclomine  20 MG tablet Commonly known as: BENTYL  Take 20 mg by mouth every 6 (six) hours. Abdominal cramps   ertapenem  IVPB Commonly known as: INVANZ  Inject 1 g into the vein daily for 8 days. Indication:  ESBL UTI First Dose: No Last Day of Therapy:  01/18/24 Labs - Once weekly:  CBC/D and BMP, Labs - Once weekly: ESR and CRP Method of administration: Mini-Bag Plus / Gravity Method of administration may be changed at the discretion of home infusion pharmacist based upon assessment of the patient and/or caregiver's ability to self-administer the medication ordered. Start taking on: January 10, 2024   estradiol  0.1 MG/GM vaginal cream Commonly known as: ESTRACE  Place 1 Applicatorful vaginally every other day. Discard plastic applicator. Insert a blueberry size amount (approximately 1 gram) of cream on fingertip inside vagina at bedtime every other night for long term use.   fluticasone 50 MCG/ACT nasal spray Commonly known as: FLONASE Place 1 spray into both nostrils in the morning and at bedtime.   folic acid  1 MG tablet Commonly known as: FOLVITE  Take 1 mg by mouth daily.   gabapentin  300 MG capsule Commonly known as: NEURONTIN  Take 1 capsule (300 mg total) by mouth 3 (three) times daily as needed (neuropathy pain symptoms). What changed:  when to take this reasons to take this   guaiFENesin 600 MG 12 hr tablet Commonly known as: MUCINEX Take 600 mg by mouth 2 (two) times daily as needed (congestion).   Hemorrhoidal Relief 5 % Crea Generic drug: Lidocaine  (Anorectal) Apply 1 Application topically every 8 (eight) hours as needed (hemorrhoids).   Humira (2 Pen) 40 MG/0.8ML Ajkt pen Generic drug: adalimumab Inject 40 mg into the skin every 14 (fourteen)  days.   lansoprazole 30 MG capsule Commonly known as: PREVACID Take 60 mg by mouth 2 (two) times daily before a meal.   leflunomide  10 MG tablet Commonly known as: ARAVA  Take 10 mg by mouth daily.   lidocaine  5 % Commonly known as: LIDODERM  Place 1 patch onto the skin every 12 (twelve) hours. Remove & Discard patch within 12 hours or as directed   loperamide  2 MG tablet Commonly known as: IMODIUM  A-D Take 2 mg by mouth every 6 (six) hours as needed  for diarrhea or loose stools.   meclizine  12.5 MG tablet Commonly known as: ANTIVERT  Take 12.5 mg by mouth 3 (three) times daily.   melatonin 5 MG Tabs Take 10 mg by mouth at bedtime.   methenamine  1 g tablet Commonly known as: HIPREX  Take 1 tablet (1 g total) by mouth 2 (two) times daily. Prophylactic Start taking on: January 22, 2024 What changed: These instructions start on January 22, 2024. If you are unsure what to do until then, ask your doctor or other care provider.   methocarbamol  500 MG tablet Commonly known as: ROBAXIN  Take 500 mg by mouth every 6 (six) hours as needed for muscle spasms.   metoprolol  tartrate 25 MG tablet Commonly known as: LOPRESSOR  Take 0.5 tablets (12.5 mg total) by mouth 2 (two) times daily. What changed: how much to take   phenazopyridine  200 MG tablet Commonly known as: PYRIDIUM  Take 200 mg by mouth every 8 (eight) hours as needed (urinary burning/pain).   potassium chloride  10 MEQ tablet Commonly known as: KLOR-CON  M Take 1 tablet (10 mEq total) by mouth daily. Start taking on: January 10, 2024   PreserVision AREDS 2 Caps Take 1 capsule by mouth in the morning and at bedtime.   Probiotic (Lactobacillus) Caps Take 1 capsule by mouth in the morning, at noon, and at bedtime.   Restasis  0.05 % ophthalmic emulsion Generic drug: cycloSPORINE  Place 1 drop into both eyes 2 (two) times daily. Dry eyes   simethicone  125 MG chewable tablet Commonly known as: MYLICON Chew 125 mg by mouth every  6 (six) hours as needed for flatulence.   ticagrelor  60 MG Tabs tablet Commonly known as: BRILINTA  Take 60 mg by mouth daily.   torsemide  20 MG tablet Commonly known as: DEMADEX  Take 20 mg by mouth daily.   traMADol  50 MG tablet Commonly known as: ULTRAM  Take 1 tablet (50 mg total) by mouth every 8 (eight) hours as needed for severe pain (pain score 7-10). What changed: reasons to take this   traZODone  100 MG tablet Commonly known as: DESYREL  Take 100 mg by mouth at bedtime.   vitamin B-12 250 MCG tablet Commonly known as: CYANOCOBALAMIN Take 250 mcg by mouth daily.   vitamin E 1000 UNIT capsule Take 1,000 Units by mouth daily.               Discharge Care Instructions  (From admission, onward)           Start     Ordered   01/09/24 0000  Change dressing on IV access line weekly and PRN  (Home infusion instructions - [facility] )        01/09/24 1133            Procedures/Studies: DG Ribs Unilateral W/Chest Left Result Date: 01/09/2024 CLINICAL DATA:  Left rib pain. EXAM: LEFT RIBS AND CHEST - 3+ VIEW COMPARISON:  Chest x-ray 01/04/2024 FINDINGS: No fracture or other bone lesions are seen involving the ribs. There is no evidence of pneumothorax or pleural effusion. Both lungs are clear. Heart size and mediastinal contours are within normal limits. Remainder of the exam is unchanged. IMPRESSION: Negative. Electronically Signed   By: Toribio Agreste M.D.   On: 01/09/2024 10:39   US  EKG SITE RITE Result Date: 01/09/2024 If Site Rite image not attached, placement could not be confirmed due to current cardiac rhythm.  CT RENAL STONE STUDY Result Date: 01/07/2024 CLINICAL DATA:  Bladder diverticulum. EXAM: CT ABDOMEN AND PELVIS WITHOUT  CONTRAST TECHNIQUE: Multidetector CT imaging of the abdomen and pelvis was performed following the standard protocol without IV contrast. RADIATION DOSE REDUCTION: This exam was performed according to the departmental  dose-optimization program which includes automated exposure control, adjustment of the mA and/or kV according to patient size and/or use of iterative reconstruction technique. COMPARISON:  CT scan renal stone protocol from 03/12/2022 and CT urogram from 08/14/2023. FINDINGS: Lower chest: There are patchy atelectatic changes in the visualized lung bases. No overt consolidation. No pleural effusion. The heart is normal in size. No pericardial effusion. Hepatobiliary: The liver is normal in size. Non-cirrhotic configuration. No suspicious mass. No intrahepatic or extrahepatic bile duct dilation. Gallbladder is surgically absent. Pancreas: Unremarkable. No pancreatic ductal dilatation or surrounding inflammatory changes. Spleen: Within normal limits. No focal lesion. Adrenals/Urinary Tract: Adrenal glands are unremarkable. No suspicious renal mass within the limitations of this unenhanced exam. Bilateral extrarenal pelves noted. There is bilateral mild hydronephrosis. Mild bilateral urothelial thickening noted. Physiologically distended urinary bladder. There is mild diffuse urinary bladder wall thickening and mild perivesical fat stranding, concerning for cystitis. Correlate clinically and with urinalysis. Bladder wall trabeculations and pseudo-diverticula noted, suggesting sequela of chronic urinary outflow obstruction. No bladder calculi or focal mass. Stomach/Bowel: No disproportionate dilation of the small or large bowel loops. No evidence of abnormal bowel wall thickening or inflammatory changes. The appendix was not visualized; however there is no acute inflammatory process in the right lower quadrant. There are multiple diverticula mainly in the sigmoid colon, without imaging signs of diverticulitis. Vascular/Lymphatic: No ascites or pneumoperitoneum. No abdominal or pelvic lymphadenopathy, by size criteria. No aneurysmal dilation of the major abdominal arteries. There are mild peripheral atherosclerotic  vascular calcifications of the aorta and its major branches. Reproductive: The uterus is surgically absent. No large adnexal mass. Other: There is a tiny fat containing umbilical hernia. The soft tissues and abdominal wall are otherwise unremarkable. Musculoskeletal: No suspicious osseous lesions. There are moderate multilevel degenerative changes in the visualized spine. 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 IMPRESSION: 1. There is mild diffuse urinary bladder wall thickening and mild perivesical fat stranding, concerning for cystitis. Correlate clinically and with urinalysis. There is mild bilateral hydronephrosis and urothelial thickening, which may be secondary to ascending urinary tract infection. No nephroureterolithiasis. No focal urinary bladder mass or bladder calculus. 2. Multiple other nonacute observations, as described above. Aortic Atherosclerosis (ICD10-I70.0). Electronically Signed   By: Ree Molt M.D.   On: 01/07/2024 13:18   CT Super D Chest Wo Contrast Result Date: 01/06/2024 CLINICAL DATA:  Follow-up pulmonary nodules EXAM: CT CHEST WITHOUT CONTRAST TECHNIQUE: Multidetector CT imaging of the chest was performed using thin slice collimation for electromagnetic bronchoscopy planning purposes, without intravenous contrast. RADIATION DOSE REDUCTION: This exam was performed according to the departmental dose-optimization program which includes automated exposure control, adjustment of the mA and/or kV according to patient size and/or use of iterative reconstruction technique. COMPARISON:  Chest CT August 22, 2022 FINDINGS: Cardiovascular: Atherosclerotic calcifications of coronary arteries. Pulmonary artery is borderline measuring 2.9 cm. No pericardial fluid. Mediastinum/Nodes: No suspicious lymphadenopathy. Heterogeneous nodular thyroid  gland. Lungs/Pleura: Subsegmental atelectasis involving the posterior aspect of the right upper lobe along the major fissure, increased to prior (7/69). Residual  nodular scarring in right middle lobe measuring 5 mm (7/53) CT, previously 2.5 x 1.5 cm. A perifissural micronodule/lymph node along the minor fissure, similar to prior. A few Perifissural nodules along the right major fissure similar to slightly more conspicuous to prior measuring up  to 5 mm (4/84: 77). No new consolidation. Diffuse bronchial and bronchiolar wall thickening. Mild right pleural thickening without significant effusion. Upper Abdomen: Atrophic changes of the pancreas. Musculoskeletal: Degenerative changes of the spine. IMPRESSION: Decreased size of the right middle lobe nodule/nodular consolidation likely improved infectious/inflammatory process. Subsegmental atelectasis of posterior aspect of right upper lobe, new to prior. A few micro nodules along the right major fissure, stable.Recommend follow-up to ensure stability/resolution. Borderline pulmonary artery which can be seen the setting of pulmonary artery hypertension. Electronically Signed   By: Megan  Zare M.D.   On: 01/06/2024 17:46   MR CERVICAL SPINE WO CONTRAST Result Date: 01/06/2024 CLINICAL DATA:  Neck pain, chronic, degenerative changes on xray EXAM: MRI CERVICAL SPINE WITHOUT CONTRAST TECHNIQUE: Multiplanar, multisequence MR imaging of the cervical spine was performed. No intravenous contrast was administered. COMPARISON:  MRI of the cervical spine dated December 02, 2008. FINDINGS: Alignment: There is mild reversal of the normal cervical lordosis in the lower cervical spine. There is degenerative anterolisthesis and focal kyphosis at C4-5. Vertebrae: The vertebral bodies maintain their height and demonstrate normal signal intensity. No osseous lesions are present. Cord: The study is limited by motion, but the spinal cord appears to be normal in morphology and signal intensity. Posterior Fossa, vertebral arteries, paraspinal tissues: Negative. Disc levels: C2-3: Normal. C3-4: Normal. C4-5: Degenerative grade 1 anterolisthesis with focal  kyphosis. No significant spinal canal or neural foraminal stenosis. C5-6: Diffuse disc bulging with mild-to-moderate central spinal canal stenosis and mild-to-moderate bilateral neural foraminal stenosis. C6-7: Mild diffuse disc bulging with mild central spinal canal stenosis. The neural foramina are patent. C7-T1: Normal. IMPRESSION: 1. Degenerative anterolisthesis at C4-5. 2. 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 No obvious spinal cord or nerve root impingement. Electronically Signed   By: Evalene Coho M.D.   On: 01/06/2024 13:58   MR LUMBAR SPINE WO CONTRAST Result Date: 01/06/2024 CLINICAL DATA:  Low back pain, symptoms persist with > 6 wks treatment Lumbar radiculopathy, symptoms persist with > 6 wks treatment EXAM: MRI LUMBAR SPINE WITHOUT CONTRAST TECHNIQUE: Multiplanar, multisequence MR imaging of the lumbar spine was performed. No intravenous contrast was administered. COMPARISON:  MRI of the lumbar spine dated February 12, 2011. FINDINGS: Segmentation:  5 non rib-bearing lumbar type vertebrae. Alignment:  Mild levo scoliosis of the thoracolumbar spine. Vertebrae: The vertebral bodies maintain their height. There are discogenic reactive changes present within the endplates at each disc space level. There are no osseous lesions present. Conus medullaris and cauda equina: Conus extends to the T12-L1 level. Conus and cauda equina appear normal. Paraspinal and other soft tissues: There is moderate right hydroureteronephrosis present. There is also abnormal thickening of the wall of the proximal right ureter and there is abrupt loss of the lumen from image 372 a image 38 of series 8. There is a prominent extrarenal pelvis present on the left, but the left ureter is normal in caliber and unremarkable. The paraspinous soft tissues are otherwise grossly unremarkable. Disc levels: L1-2: Mild disc space narrowing  and minimal disc bulging. No significant spinal canal or neural foraminal stenosis. L2-3: Slight degenerative anterolisthesis. Mild diffuse disc bulging with mild central spinal canal stenosis and bilateral lateral recess stenosis. No apparent nerve root impingement. L3-4: Minimal disc bulging. No significant spinal canal or neural foraminal stenosis. L4-5: Mild disc bulging and endplate ridging. No significant spinal canal or neural foraminal stenosis. L5-S1: Status post left laminotomy and medial  facet ectomy. Disc space narrowing. No residual spinal canal stenosis. There is mild-to-moderate left neural foraminal stenosis, but no apparent nerve root impingement. IMPRESSION: 1. There is right-sided hydroureteronephrosis with abnormal thickening of the wall of the ureter and abrupt and of the lumen, concerning for either endo luminal or endo mural mass. Recommend follow-up CT urogram without and with intravenous contrast. 2. Mild levoscoliosis and multilevel chronic degenerative disc disease without significant central spinal canal or neural foraminal stenosis. No obvious nerve root impingement. Electronically Signed   By: Evalene Coho M.D.   On: 01/06/2024 13:52   MR THORACIC SPINE WO CONTRAST Result Date: 01/06/2024 CLINICAL DATA:  Mid-back pain Mid-back pain, spondyloarthropathy suspected, xray done EXAM: MRI THORACIC SPINE WITHOUT CONTRAST TECHNIQUE: Multiplanar, multisequence MR imaging of the thoracic spine was performed. No intravenous contrast was administered. COMPARISON:  Thoracic spine series dated June 24, 2014. FINDINGS: Alignment: There is mild sigmoid scoliosis of the thoracic spine, with the upper curvature convex the left. Vertebrae: The vertebral bodies maintain their height. There are several endplate irregularities, but no acute fractures or osseous lesions present. Cord:  Normal in morphology and signal intensity. Paraspinal and other soft tissues: Negative. Disc levels: There is mild  disc bulging with mild central spinal canal stenosis at T3-4, T8-9, T11-12 and T12-L1. There is no spinal cord or nerve root impingement. IMPRESSION: 1. Mild sigmoid scoliosis and multilevel degenerative disc disease without significant spinal canal or neural foraminal stenosis. Electronically Signed   By: Evalene Coho M.D.   On: 01/06/2024 13:38   CT Head Wo Contrast Result Date: 01/04/2024 CLINICAL DATA:  Mental status change, unknown cause EXAM: CT HEAD WITHOUT CONTRAST TECHNIQUE: Contiguous axial images were obtained from the base of the skull through the vertex without intravenous contrast. RADIATION DOSE REDUCTION: This exam was performed according to the departmental dose-optimization program which includes automated exposure control, adjustment of the mA and/or kV according to patient size and/or use of iterative reconstruction technique. COMPARISON:  Nov 05, 2023 FINDINGS: Brain: Proportional prominence of the ventricles and sulci, consistent with diffuse cerebral parenchymal volume loss. The ventricles otherwise maintained midline position without midline shift. Gray-white differentiation is preserved.Periventricular and subcortical white matter hypoattenuation, most consistent with changes of moderate chronic ischemic microvascular disease.No evidence of acute territorial infarction, extra-axial fluid collection, hemorrhage, or mass lesion. 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 The basilar cisterns are patent without downward herniation. The cerebellar hemispheres and vermis are well formed without mass lesion or focal attenuation abnormality. Vascular: No hyperdense vessel. Skull: Normal. Negative for fracture or focal lesion. Sinuses/Orbits: The paranasal sinuses and mastoids are clear.The globes appear intact. No retrobulbar hematoma. Other: None. IMPRESSION: 1. No acute intracranial abnormality, specifically, no acute hemorrhage, territorial infarction, or intracranial mass. 2.  Global cerebral volume loss with sequelae of chronic ischemic microvascular disease. Electronically Signed   By: Rogelia Myers M.D.   On: 01/04/2024 21:17   DG Chest Port 1 View Result Date: 01/04/2024 CLINICAL DATA:  sob EXAM: PORTABLE CHEST - 1 VIEW COMPARISON:  August 20, 2018 FINDINGS: No focal airspace consolidation, pleural effusion, or pneumothorax. Moderate cardiomegaly. Tortuous aorta with aortic atherosclerosis. No acute fracture or destructive lesions. Multilevel thoracic osteophytosis. IMPRESSION: No acute cardiopulmonary abnormality. Electronically Signed   By: Rogelia Myers M.D.   On: 01/04/2024 19:59     Subjective:   Discharge Exam: Vitals:   01/09/24 0507 01/09/24 0800  BP: 133/61 (!) 154/69  Pulse: 69 75  Resp: 18   Temp:  98.1 F (36.7 C)   SpO2: 95%    Vitals:   01/08/24 1300 01/08/24 2228 01/09/24 0507 01/09/24 0800  BP: 103/84 (!) 125/58 133/61 (!) 154/69  Pulse: 92 88 69 75  Resp: 18 20 18    Temp: 98.1 F (36.7 C) 98.2 F (36.8 C) 98.1 F (36.7 C)   TempSrc: Oral Oral Oral   SpO2: 100% 94% 95%   Weight:      Height:        General: Pt is alert, awake, not in acute distress Cardiovascular: RRR, S1/S2 +, no rubs, no gallops Respiratory: CTA bilaterally, no wheezing, no rhonchi Abdominal: Soft, NT, ND, bowel sounds + Extremities: no edema, no cyanosis   The results of significant diagnostics from this hospitalization (including imaging, microbiology, ancillary and laboratory) are listed below for reference.     Microbiology: Recent Results (from the past 240 hours)  Urine Culture     Status: Abnormal   Collection Time: 01/04/24  8:17 PM   Specimen: Urine, Catheterized  Result Value Ref Range Status   Specimen Description   Final    URINE, CATHETERIZED Performed at [facility], [address], [place], [place] [address]    Special Requests   Final    NONE Performed at [facility], [address], [place], [place] [address]    Culture    Final    Two isolates with different morphologies were identified as the same organism.The most resistant organism was reported. 70,000 COLONIES/mL KLEBSIELLA PNEUMONIAE Confirmed Extended Spectrum Beta-Lactamase Producer (ESBL).  In bloodstream infections from ESBL organisms, carbapenems are preferred over piperacillin/tazobactam. They are shown to have a lower risk of mortality.    Report Status 01/07/2024 FINAL  Final   Organism ID, Bacteria KLEBSIELLA PNEUMONIAE (A)  Final      Susceptibility   Klebsiella pneumoniae - MIC*    AMPICILLIN >=32 RESISTANT Resistant     CEFAZOLIN >=64 RESISTANT Resistant     CEFTRIAXONE  >=64 RESISTANT Resistant     CIPROFLOXACIN  >=4 RESISTANT Resistant     GENTAMICIN <=1 SENSITIVE Sensitive     IMIPENEM <=0.25 SENSITIVE Sensitive     NITROFURANTOIN  >=512 RESISTANT Resistant     TRIMETH /SULFA  >=320 RESISTANT Resistant     AMPICILLIN/SULBACTAM >=32 RESISTANT Resistant     * 70,000 COLONIES/mL KLEBSIELLA PNEUMONIAE  C Difficile Quick Screen w PCR reflex     Status: None   Collection Time: 01/06/24  5:45 AM   Specimen: STOOL  Result Value Ref Range Status   C Diff antigen NEGATIVE NEGATIVE Final   C Diff toxin NEGATIVE NEGATIVE Final   C Diff interpretation No C. difficile detected.  Final    Comment: Performed at [facility], [address], [place], [place] [address]  Gastrointestinal Panel by PCR , Stool     Status: None   Collection Time: 01/06/24  5:45 AM   Specimen: Stool  Result Value Ref Range Status   Campylobacter species NOT DETECTED NOT DETECTED Final   Plesimonas shigelloides NOT DETECTED NOT DETECTED Final   Salmonella species NOT DETECTED NOT DETECTED Final   Yersinia enterocolitica NOT DETECTED NOT DETECTED Final   Vibrio species NOT DETECTED NOT DETECTED Final   Vibrio cholerae NOT DETECTED NOT DETECTED Final   Enteroaggregative E coli (EAEC) NOT DETECTED NOT DETECTED Final   Enteropathogenic E coli (EPEC) NOT DETECTED NOT DETECTED  Final   Enterotoxigenic E coli (ETEC) NOT DETECTED NOT DETECTED Final   Shiga like toxin producing E coli (STEC) NOT  DETECTED NOT DETECTED Final   Shigella/Enteroinvasive E coli (EIEC) NOT DETECTED NOT DETECTED Final   Cryptosporidium NOT DETECTED NOT DETECTED Final   Cyclospora cayetanensis NOT DETECTED NOT DETECTED Final   Entamoeba histolytica NOT DETECTED NOT DETECTED Final   Giardia lamblia NOT DETECTED NOT DETECTED Final   Adenovirus F40/41 NOT DETECTED NOT DETECTED Final   Astrovirus NOT DETECTED NOT DETECTED Final   Norovirus GI/GII NOT DETECTED NOT DETECTED Final   Rotavirus A NOT DETECTED NOT DETECTED Final   Sapovirus (I, II, IV, and V) NOT DETECTED NOT DETECTED Final    Comment: Performed at [facility], [address] [place], [place] [address]     Labs: BNP (last 3 results) No results for input(s): BNP in the last 8760 hours. Basic Metabolic Panel: Recent Labs  Lab 01/05/24 0434 01/06/24 0810 01/07/24 0427 01/08/24 0432 01/09/24 0418  NA 134* 133* 137 138 138  K 4.4 4.2 3.6 3.5 3.3*  CL 105 106 106 103 103  CO2 20* 21* 21* 24 24  GLUCOSE 118* 110* 105* 114* 112*  BUN 24* 23 24* 25* 25*  CREATININE 1.73* 1.44* 1.31* 1.29* 1.18*  CALCIUM  7.7* 7.9* 7.9* 8.5* 8.3*  MG  --   --  1.8  --   --    Liver Function Tests: Recent Labs  Lab 01/04/24 1935 01/05/24 0434  AST 13* 11*  ALT 9 9  ALKPHOS 165* 141*  BILITOT 1.2 1.3*  PROT 6.8 5.8*  ALBUMIN 2.5* 2.1*   No results for input(s): LIPASE, AMYLASE in the last 168 hours. No results for input(s): AMMONIA in the last 168 hours. CBC: Recent Labs  Lab 01/04/24 1935 01/05/24 0434 01/05/24 0803 01/05/24 1848 01/06/24 0810 01/07/24 0427  WBC 19.0* 16.1*  --   --  13.6* 9.5  NEUTROABS 15.7*  --   --   --  9.7* 5.7  HGB 7.9* 6.7* 6.3* 10.2* 9.1* 9.7*  HCT 26.4* 21.7* 20.9* 32.7* 28.6* 29.7*  MCV 101.1* 101.4*  --   --  96.0 95.8  PLT 573* 464*  --   --  407* 429*   Cardiac Enzymes: No  results for input(s): CKTOTAL, CKMB, CKMBINDEX, TROPONINI in the last 168 hours. BNP: Invalid input(s): POCBNP CBG: No results for input(s): GLUCAP in the last 168 hours. D-Dimer No results for input(s): DDIMER in the last 72 hours. Hgb A1c No results for input(s): HGBA1C in the last 72 hours. Lipid Profile No results for input(s): CHOL, HDL, LDLCALC, TRIG, CHOLHDL, LDLDIRECT in the last 72 hours. Thyroid  function studies No results for input(s): TSH, T4TOTAL, T3FREE, THYROIDAB in the last 72 hours.  Invalid input(s): FREET3 Anemia work up No results for input(s): VITAMINB12, FOLATE, FERRITIN, TIBC, IRON, RETICCTPCT in the last 72 hours. Urinalysis    Component Value Date/Time   COLORURINE ORANGE (A) 01/04/2024 2017   APPEARANCEUR TURBID (A) 01/04/2024 2017   APPEARANCEUR Cloudy (A) 02/10/2023 1454   LABSPEC 1.016 01/04/2024 2017   PHURINE 5.0 01/04/2024 2017   GLUCOSEU NEGATIVE 01/04/2024 2017   HGBUR MODERATE (A) 01/04/2024 2017   BILIRUBINUR NEGATIVE 01/04/2024 2017   BILIRUBINUR Negative 02/10/2023 1454   KETONESUR NEGATIVE 01/04/2024 2017   PROTEINUR 100 (A) 01/04/2024 2017   UROBILINOGEN 0.2 04/10/2015 2212   NITRITE POSITIVE (A) 01/04/2024 2017   LEUKOCYTESUR SMALL (A) 01/04/2024 2017   Sepsis Labs Recent Labs  Lab 01/04/24 1935 01/05/24 0434 01/06/24 0810 01/07/24 0427  WBC 19.0* 16.1* 13.6* 9.5   Microbiology Recent Results (  from the past 240 hours)  Urine Culture     Status: Abnormal   Collection Time: 01/04/24  8:17 PM   Specimen: Urine, Catheterized  Result Value Ref Range Status   Specimen Description   Final    URINE, CATHETERIZED Performed at [facility], [address], [place], [place] [address]    Special Requests   Final    NONE Performed at [facility], [address] [place], [place] [address]    Culture   Final    Two isolates with different morphologies were identified as the same  organism.The most resistant organism was reported. 70,000 COLONIES/mL KLEBSIELLA PNEUMONIAE Confirmed Extended Spectrum Beta-Lactamase Producer (ESBL).  In bloodstream infections from ESBL organisms, carbapenems are preferred over piperacillin/tazobactam. They are shown to have a lower risk of mortality.    Report Status 01/07/2024 FINAL  Final   Organism ID, Bacteria KLEBSIELLA PNEUMONIAE (A)  Final      Susceptibility   Klebsiella pneumoniae - MIC*    AMPICILLIN >=32 RESISTANT Resistant     CEFAZOLIN >=64 RESISTANT Resistant     CEFTRIAXONE  >=64 RESISTANT Resistant     CIPROFLOXACIN  >=4 RESISTANT Resistant     GENTAMICIN <=1 SENSITIVE Sensitive     IMIPENEM <=0.25 SENSITIVE Sensitive     NITROFURANTOIN  >=512 RESISTANT Resistant     TRIMETH /SULFA  >=320 RESISTANT Resistant     AMPICILLIN/SULBACTAM >=32 RESISTANT Resistant     * 70,000 COLONIES/mL KLEBSIELLA PNEUMONIAE  C Difficile Quick Screen w PCR reflex     Status: None   Collection Time: 01/06/24  5:45 AM   Specimen: STOOL  Result Value Ref Range Status   C Diff antigen NEGATIVE NEGATIVE Final   C Diff toxin NEGATIVE NEGATIVE Final   C Diff interpretation No C. difficile detected.  Final    Comment: Performed at [facility], [address] [place], [place] [address]  Gastrointestinal Panel by PCR , Stool     Status: None   Collection Time: 01/06/24  5:45 AM   Specimen: Stool  Result Value Ref Range Status   Campylobacter species NOT DETECTED NOT DETECTED Final   Plesimonas shigelloides NOT DETECTED NOT DETECTED Final   Salmonella species NOT DETECTED NOT DETECTED Final   Yersinia enterocolitica NOT DETECTED NOT DETECTED Final   Vibrio species NOT DETECTED NOT DETECTED Final   Vibrio cholerae NOT DETECTED NOT DETECTED Final   Enteroaggregative E coli (EAEC) NOT DETECTED NOT DETECTED Final   Enteropathogenic E coli (EPEC) NOT DETECTED NOT DETECTED Final   Enterotoxigenic E coli (ETEC) NOT DETECTED NOT DETECTED Final   Shiga  like toxin producing E coli (STEC) NOT DETECTED NOT DETECTED Final   Shigella/Enteroinvasive E coli (EIEC) NOT DETECTED NOT DETECTED Final   Cryptosporidium NOT DETECTED NOT DETECTED Final   Cyclospora cayetanensis NOT DETECTED NOT DETECTED Final   Entamoeba histolytica NOT DETECTED NOT DETECTED Final   Giardia lamblia NOT DETECTED NOT DETECTED Final   Adenovirus F40/41 NOT DETECTED NOT DETECTED Final   Astrovirus NOT DETECTED NOT DETECTED Final   Norovirus GI/GII NOT DETECTED NOT DETECTED Final   Rotavirus A NOT DETECTED NOT DETECTED Final   Sapovirus (I, II, IV, and V) NOT DETECTED NOT DETECTED Final    Comment: Performed at [facility], [address] [place], [place] [address]   Time coordinating discharge: 47 mins   SIGNED:  Afton Louder, MD  [facility] 01/09/2024, 11:51 AM How to contact the [facility] Attending or Consulting provider 7A - 7P or covering  provider during after hours 7P -7A, for this patient?  Check the care team in [facility] and look for a) attending/consulting TRH provider listed and b) the TRH team listed Log into [email or web address] and use [place]'s universal password to access. If you do not have the password, please contact the hospital operator. Locate the TRH provider you are looking for under [facility] and page to a number that you can be directly reached. If you still have difficulty reaching the provider, please page the [facility] (Director on Call) for the Hospitalists listed on amion for assistance.

## 2024-01-09 NOTE — Plan of Care (Signed)

## 2024-01-09 NOTE — Discharge Instructions (Signed)
IMPORTANT INFORMATION: PAY CLOSE ATTENTION  ? ?PHYSICIAN DISCHARGE INSTRUCTIONS ? ?Follow with Primary care provider  Fusco, Lawrence, MD  and other consultants as instructed by your Hospitalist Physician ? ?SEEK MEDICAL CARE OR RETURN TO EMERGENCY ROOM IF SYMPTOMS COME BACK, WORSEN OR NEW PROBLEM DEVELOPS  ? ?Please note: ?You were cared for by a hospitalist during your hospital stay. Every effort will be made to forward records to your primary care provider.  You can request that your primary care provider send for your hospital records if they have not received them.  Once you are discharged, your primary care physician will handle any further medical issues. Please note that NO REFILLS for any discharge medications will be authorized once you are discharged, as it is imperative that you return to your primary care physician (or establish a relationship with a primary care physician if you do not have one) for your post hospital discharge needs so that they can reassess your need for medications and monitor your lab values. ? ?Please get a complete blood count and chemistry panel checked by your Primary MD at your next visit, and again as instructed by your Primary MD. ? ?Get Medicines reviewed and adjusted: ?Please take all your medications with you for your next visit with your Primary MD ? ?Laboratory/radiological data: ?Please request your Primary MD to go over all hospital tests and procedure/radiological results at the follow up, please ask your primary care provider to get all Hospital records sent to his/her office. ? ?In some cases, they will be blood work, cultures and biopsy results pending at the time of your discharge. Please request that your primary care provider follow up on these results. ? ?If you are diabetic, please bring your blood sugar readings with you to your follow up appointment with primary care.   ? ?Please call and make your follow up appointments as soon as possible.   ? ?Also Note  the following: ?If you experience worsening of your admission symptoms, develop shortness of breath, life threatening emergency, suicidal or homicidal thoughts you must seek medical attention immediately by calling 911 or calling your MD immediately  if symptoms less severe. ? ?You must read complete instructions/literature along with all the possible adverse reactions/side effects for all the Medicines you take and that have been prescribed to you. Take any new Medicines after you have completely understood and accpet all the possible adverse reactions/side effects.  ? ?Do not drive when taking Pain medications or sleeping medications (Benzodiazepines) ? ?Do not take more than prescribed Pain, Sleep and Anxiety Medications. It is not advisable to combine anxiety,sleep and pain medications without talking with your primary care practitioner ? ?Special Instructions: If you have smoked or chewed Tobacco  in the last 2 yrs please stop smoking, stop any regular Alcohol  and or any Recreational drug use. ? ?Wear Seat belts while driving.  Do not drive if taking any narcotic, mind altering or controlled substances or recreational drugs or alcohol.  ? ? ? ? ? ?

## 2024-01-09 NOTE — TOC Transition Note (Addendum)
 Transition of Care [facility]) - Discharge Note   Patient Details  Name: Robin Blackburn MRN: [id number] Date of Birth: 07-05-1945  Transition [facility] CM/SW Contact:  Noreen KATHEE Pinal, LCSWA Phone Number: 01/09/2024, 11:58 AM   Clinical Narrative:     Patient is discharging today back to [facility]. Patient and Friend updated. [facility] is aware of patient coming today , room number is B2-2 and number to call report is [phone]. Med necessity completed. Nurse updated and MD updated. Patient was added to EMS list.TOC signing off.   CSW was notified from transportation, that [facility] is on their way to get patient. Nurse updated.   Final next level of care: Skilled Nursing Facility Barriers to Discharge: Barriers Resolved   Patient Goals and CMS Choice Patient states their goals for this hospitalization and ongoing recovery are:: return back to [facility] CMS Medicare.gov Compare Post Acute Care list provided to:: Patient Choice offered to / list presented to : Patient [place] ownership interest in [facility].provided to:: Patient    Discharge Placement              Patient chooses bed at:  [facility]) Patient to be transferred to facility by: [facility] Name of family member notified: Tanica and Louanne ( friend ) Patient and family notified of of transfer: 01/09/24  Discharge Plan and Services Additional resources added to the After Visit Summary for                                       Social Drivers of Health (SDOH) Interventions SDOH Screenings   Food Insecurity: No Food Insecurity (01/04/2024)  Housing: Low Risk  (01/04/2024)  Transportation Needs: No Transportation Needs (01/04/2024)  Utilities: Not At Risk (01/04/2024)  Alcohol  Screen: Low Risk  (01/06/2022)  Depression (PHQ2-9): Low Risk  (12/15/2023)  Recent Concern: Depression (PHQ2-9) - Medium Risk (10/13/2023)  Financial Resource Strain: Low Risk  (01/06/2022)  Physical Activity: Inactive (11/07/2022)    Received from [facility]  Social Connections: Socially Isolated (01/04/2024)  Stress: No Stress Concern Present (01/06/2022)  Tobacco Use: Low Risk  (01/04/2024)     Readmission Risk Interventions    01/09/2024   11:45 AM 01/08/2024    1:10 PM 01/05/2024   10:44 AM  Readmission Risk Prevention Plan  Transportation Screening Complete Complete Complete  PCP or Specialist Appt within 5-7 Days   Not Complete  Home Care Screening Complete Complete Complete  Medication Review (RN CM) Complete Complete Complete

## 2024-01-10 ENCOUNTER — Ambulatory Visit: Payer: Self-pay | Admitting: Emergency Medicine

## 2024-01-11 DIAGNOSIS — M6281 Muscle weakness (generalized): Secondary | ICD-10-CM | POA: Diagnosis not present

## 2024-01-11 DIAGNOSIS — D649 Anemia, unspecified: Secondary | ICD-10-CM | POA: Diagnosis not present

## 2024-01-11 DIAGNOSIS — G4700 Insomnia, unspecified: Secondary | ICD-10-CM | POA: Diagnosis not present

## 2024-01-11 DIAGNOSIS — Z9119 Patient's noncompliance with other medical treatment and regimen due to financial hardship: Secondary | ICD-10-CM | POA: Diagnosis not present

## 2024-01-11 DIAGNOSIS — F32A Depression, unspecified: Secondary | ICD-10-CM | POA: Diagnosis not present

## 2024-01-11 DIAGNOSIS — M069 Rheumatoid arthritis, unspecified: Secondary | ICD-10-CM | POA: Diagnosis not present

## 2024-01-11 DIAGNOSIS — F419 Anxiety disorder, unspecified: Secondary | ICD-10-CM | POA: Diagnosis not present

## 2024-01-12 DIAGNOSIS — R059 Cough, unspecified: Secondary | ICD-10-CM | POA: Diagnosis not present

## 2024-01-13 ENCOUNTER — Encounter (HOSPITAL_COMMUNITY): Payer: Self-pay

## 2024-01-13 ENCOUNTER — Other Ambulatory Visit: Payer: Self-pay

## 2024-01-13 ENCOUNTER — Emergency Department (HOSPITAL_COMMUNITY)
Admission: EM | Admit: 2024-01-13 | Discharge: 2024-01-13 | Disposition: A | Discharge status: Home / Self Care | Source: Skilled Nursing Facility | Attending: Emergency Medicine | Admitting: Emergency Medicine

## 2024-01-13 ENCOUNTER — Emergency Department (HOSPITAL_COMMUNITY)

## 2024-01-13 DIAGNOSIS — N390 Urinary tract infection, site not specified: Secondary | ICD-10-CM | POA: Diagnosis not present

## 2024-01-13 DIAGNOSIS — Z452 Encounter for adjustment and management of vascular access device: Secondary | ICD-10-CM | POA: Diagnosis not present

## 2024-01-13 DIAGNOSIS — D509 Iron deficiency anemia, unspecified: Secondary | ICD-10-CM | POA: Insufficient documentation

## 2024-01-13 DIAGNOSIS — N3001 Acute cystitis with hematuria: Secondary | ICD-10-CM | POA: Diagnosis not present

## 2024-01-13 DIAGNOSIS — J9811 Atelectasis: Secondary | ICD-10-CM | POA: Diagnosis not present

## 2024-01-13 DIAGNOSIS — I517 Cardiomegaly: Secondary | ICD-10-CM | POA: Diagnosis not present

## 2024-01-13 DIAGNOSIS — I10 Essential (primary) hypertension: Secondary | ICD-10-CM | POA: Diagnosis not present

## 2024-01-13 DIAGNOSIS — R531 Weakness: Secondary | ICD-10-CM | POA: Diagnosis not present

## 2024-01-13 DIAGNOSIS — Z743 Need for continuous supervision: Secondary | ICD-10-CM | POA: Diagnosis not present

## 2024-01-13 DIAGNOSIS — D649 Anemia, unspecified: Secondary | ICD-10-CM | POA: Diagnosis not present

## 2024-01-13 DIAGNOSIS — M069 Rheumatoid arthritis, unspecified: Secondary | ICD-10-CM | POA: Diagnosis not present

## 2024-01-13 LAB — CBC WITH DIFFERENTIAL/PLATELET
Abs Immature Granulocytes: 0.14 K/uL — ABNORMAL HIGH (ref 0.00–0.07)
Basophils Absolute: 0.1 K/uL (ref 0.0–0.1)
Basophils Relative: 1 %
Eosinophils Absolute: 0.5 K/uL (ref 0.0–0.5)
Eosinophils Relative: 3 %
HCT: 30.4 % — ABNORMAL LOW (ref 36.0–46.0)
Hemoglobin: 9.6 g/dL — ABNORMAL LOW (ref 12.0–15.0)
Immature Granulocytes: 1 %
Lymphocytes Relative: 10 %
Lymphs Abs: 1.8 K/uL (ref 0.7–4.0)
MCH: 31.3 pg (ref 26.0–34.0)
MCHC: 31.6 g/dL (ref 30.0–36.0)
MCV: 99 fL (ref 80.0–100.0)
Monocytes Absolute: 1 K/uL (ref 0.1–1.0)
Monocytes Relative: 6 %
Neutro Abs: 13.8 K/uL — ABNORMAL HIGH (ref 1.7–7.7)
Neutrophils Relative %: 79 %
Platelets: 511 K/uL — ABNORMAL HIGH (ref 150–400)
RBC: 3.07 MIL/uL — ABNORMAL LOW (ref 3.87–5.11)
RDW: 13.8 % (ref 11.5–15.5)
WBC: 17.3 K/uL — ABNORMAL HIGH (ref 4.0–10.5)
nRBC: 0 % (ref 0.0–0.2)

## 2024-01-13 LAB — URINALYSIS, ROUTINE W REFLEX MICROSCOPIC
Glucose, UA: 250 mg/dL — AB
Ketones, ur: NEGATIVE mg/dL
Nitrite: POSITIVE — AB
Protein, ur: 100 mg/dL — AB
Specific Gravity, Urine: 1.015 (ref 1.005–1.030)
pH: 5 (ref 5.0–8.0)

## 2024-01-13 LAB — COMPREHENSIVE METABOLIC PANEL WITH GFR
ALT: 7 U/L (ref 0–44)
AST: 10 U/L — ABNORMAL LOW (ref 15–41)
Albumin: 2.3 g/dL — ABNORMAL LOW (ref 3.5–5.0)
Alkaline Phosphatase: 109 U/L (ref 38–126)
Anion gap: 9 (ref 5–15)
BUN: 21 mg/dL (ref 8–23)
CO2: 21 mmol/L — ABNORMAL LOW (ref 22–32)
Calcium: 8.3 mg/dL — ABNORMAL LOW (ref 8.9–10.3)
Chloride: 107 mmol/L (ref 98–111)
Creatinine, Ser: 1.36 mg/dL — ABNORMAL HIGH (ref 0.44–1.00)
GFR, Estimated: 40 mL/min — ABNORMAL LOW (ref >60)
Glucose, Bld: 122 mg/dL — ABNORMAL HIGH (ref 70–99)
Potassium: 4.2 mmol/L (ref 3.5–5.1)
Sodium: 137 mmol/L (ref 135–145)
Total Bilirubin: 0.6 mg/dL (ref 0.0–1.2)
Total Protein: 6.2 g/dL — ABNORMAL LOW (ref 6.5–8.1)

## 2024-01-13 LAB — TYPE AND SCREEN
ABO/RH(D): O POS
Antibody Screen: NEGATIVE

## 2024-01-13 LAB — LIPASE, BLOOD: Lipase: 19 U/L (ref 11–51)

## 2024-01-13 LAB — LACTIC ACID, PLASMA: Lactic Acid, Venous: 0.9 mmol/L (ref 0.5–1.9)

## 2024-01-13 LAB — URINALYSIS, MICROSCOPIC (REFLEX): WBC, UA: >50 WBC/hpf (ref 0–5)

## 2024-01-13 MED ORDER — LACTATED RINGERS IV BOLUS
1000.0000 mL | Freq: Once | INTRAVENOUS | Status: AC
  Administered 2024-01-13: 1000 mL via INTRAVENOUS

## 2024-01-13 NOTE — ED Notes (Signed)
 [facility] has been notified of Pt discharged and [facility] has been contacted to transport Pt back to the facility.

## 2024-01-13 NOTE — ED Triage Notes (Signed)
 Pt bib [facility] from [facility] for abnormal labs, facility states hgb is 9, RBC is 3.2. Pt had a transfusion around a week ago.

## 2024-01-13 NOTE — Discharge Instructions (Addendum)
 Your hemoglobin is stable today so you do not need a blood transfusion at this time.  Continue with your plans going forward that were outlined at your recent discharge from your inpatient stay here.  We are making no changes in your medicines today.

## 2024-01-13 NOTE — ED Notes (Signed)
 PA informed the this nurse that the Pt was wet. Pt was cleaned up and new sheets, brief, and blankets provided.

## 2024-01-13 NOTE — ED Provider Notes (Signed)
 [place] EMERGENCY DEPARTMENT AT [facility] Provider Note   CSN: [id number] Arrival date & time: 01/13/24  1345     Patient presents with: abnormal labs   Robin Blackburn is a 78 y.o. female presenting from her nursing home for evaluation of concern for a low hemoglobin level at 9.0.  Patient was recently discharged from this hospital on August 2 where she was treated for encephalopathy felt to be associated with UTI.  Her urine cultures were positive for ESBL Klebsiella pneumonia and she was sent to her nursing facility with a PICC line with plans to receive ertapenem  IV through August 12.  During that admission she was also found to be significantly iron deficiency anemic and was given 2 units of PRBCs while in house.  Her  last hemoglobin after transfusion was 9.7 on July 31.  She has no complaints of pain, nausea or vomiting, fevers, does endorse generalized weakness.  She is awake and oriented x 3.   The history is provided by the patient, the nursing home and medical records.       Prior to Admission medications   Medication Sig Start Date End Date Taking? Authorizing Provider  acetaminophen  (TYLENOL ) 500 MG tablet Take 1,000 mg by mouth every 6 (six) hours as needed for mild pain (pain score 1-3) or moderate pain (pain score 4-6).   Yes [provider]  Adalimumab (HUMIRA, 2 PEN,) 40 MG/0.8ML PNKT Inject 40 mg into the skin every 14 (fourteen) days.   Yes [provider]  ALPRAZolam  (XANAX ) 0.5 MG tablet Take 1 tablet (0.5 mg total) by mouth 2 (two) times daily as needed for anxiety. Patient taking differently: Take 0.5 mg by mouth 2 (two) times daily. 01/09/24  Yes [name], [name], MD  amLODipine  (NORVASC ) 2.5 MG tablet Take 2.5 mg by mouth daily. 12/10/23  Yes [provider]  ascorbic acid (VITAMIN C) 500 MG tablet Take 500 mg by mouth daily.   Yes [provider]  chlorhexidine  (PERIDEX ) 0.12 % solution Use as directed 15 mLs in the  mouth or throat 2 (two) times daily.   Yes [provider]  Cholecalciferol  5000 units TABS Take 1 tablet by mouth daily.    Yes [provider]  Cranberry 450 MG TABS Take 1 tablet by mouth in the morning and at bedtime. For UTI   Yes [provider]  cycloSPORINE  (RESTASIS ) 0.05 % ophthalmic emulsion Place 1 drop into both eyes 2 (two) times daily. Dry eyes   Yes [provider]  diclofenac  Sodium (VOLTAREN ) 1 % GEL Apply 2 g topically in the morning and at bedtime. Apply to posterior neck 05/20/22  Yes [provider]  dicyclomine  (BENTYL ) 20 MG tablet Take 20 mg by mouth every 6 (six) hours. Abdominal cramps 01/31/14  Yes [provider]  ertapenem  (INVANZ ) IVPB Inject 1 g into the vein daily for 8 days. Indication:  ESBL UTI First Dose: No Last Day of Therapy:  01/18/24 Labs - Once weekly:  CBC/D and BMP, Labs - Once weekly: ESR and CRP Method of administration: Mini-Bag Plus / Gravity Method of administration may be changed at the discretion of home infusion pharmacist based upon assessment of the patient and/or caregiver's ability to self-administer the medication ordered. 01/10/24 01/18/24 Yes [name], [name], MD  estradiol  (ESTRACE ) 0.1 MG/GM vaginal cream Place 1 Applicatorful vaginally every other day. Discard plastic applicator. Insert a blueberry size amount (approximately 1 gram) of cream on fingertip inside  vagina at bedtime every other night for long term use. 01/09/24  Yes [name], [name], MD  fluticasone (FLONASE) 50 MCG/ACT nasal spray Place 1 spray into both nostrils in the morning and at bedtime.   Yes [provider]  folic acid  (FOLVITE ) 1 MG tablet Take 1 mg by mouth daily.   Yes [provider]  gabapentin  (NEURONTIN ) 300 MG capsule Take 1 capsule (300 mg total) by mouth 3 (three) times daily as needed (neuropathy pain symptoms). Patient taking differently: Take 300 mg by mouth 3 (three) times daily.  01/09/24  Yes [name], [name], MD  guaiFENesin (MUCINEX) 600 MG 12 hr tablet Take 600 mg by mouth 2 (two) times daily as needed (congestion).   Yes [provider]  lansoprazole (PREVACID) 30 MG capsule Take 60 mg by mouth 2 (two) times daily before a meal.   Yes [provider]  leflunomide  (ARAVA ) 10 MG tablet Take 10 mg by mouth daily. 08/01/20  Yes [provider]  lidocaine  (LIDODERM ) 5 % Place 1 patch onto the skin every 12 (twelve) hours. Remove & Discard patch within 12 hours or as directed 12/24/21  Yes Debby Genre L, NP  Lidocaine , Anorectal, (HEMORRHOIDAL RELIEF) 5 % CREA Apply 1 Application topically every 8 (eight) hours as needed (hemorrhoids).   Yes [provider]  loperamide  (IMODIUM  A-D) 2 MG tablet Take 2 mg by mouth every 6 (six) hours as needed for diarrhea or loose stools.   Yes [provider]  meclizine  (ANTIVERT ) 12.5 MG tablet Take 12.5 mg by mouth 3 (three) times daily.   Yes [provider]  melatonin 5 MG TABS Take 10 mg by mouth at bedtime.   Yes [provider]  methocarbamol  (ROBAXIN ) 500 MG tablet Take 500 mg by mouth every 6 (six) hours as needed for muscle spasms. 10/30/23  Yes [provider]  metoprolol  tartrate (LOPRESSOR ) 25 MG tablet Take 0.5 tablets (12.5 mg total) by mouth 2 (two) times daily. 01/09/24  Yes [name], [name], MD  Multiple Vitamins-Minerals (PRESERVISION AREDS 2) CAPS Take 1 capsule by mouth in the morning and at bedtime.   Yes [provider]  Nutritional Supplement LIQD Take 120 mLs by mouth daily at 6 PM. House supplement on [facility]   Yes [provider]  phenazopyridine  (PYRIDIUM ) 200 MG tablet Take 200 mg by mouth every 8 (eight) hours as needed (urinary burning/pain). 12/28/23  Yes [provider]  potassium chloride  SA (KLOR-CON  M) 10 MEQ tablet Take 1 tablet (10 mEq total) by mouth daily. 01/10/24  Yes [name], [name], MD  Probiotic,  Lactobacillus, CAPS Take 1 capsule by mouth in the morning, at noon, and at bedtime.   Yes [provider]  simethicone  (MYLICON) 125 MG chewable tablet Chew 125 mg by mouth every 6 (six) hours as needed for flatulence.   Yes [provider]  ticagrelor  (BRILINTA ) 60 MG TABS tablet Take 60 mg by mouth daily.   Yes [provider]  torsemide  (DEMADEX ) 20 MG tablet Take 20 mg by mouth daily. 08/31/23  Yes [provider]  traMADol  (ULTRAM ) 50 MG tablet Take 1 tablet (50 mg total) by mouth every 8 (eight) hours as needed for severe pain (pain score 7-10). 01/09/24  Yes [name], [name], MD  traZODone  (DESYREL ) 100 MG tablet Take 100 mg by mouth at bedtime. 02/25/23  Yes [provider]  vitamin B-12 (CYANOCOBALAMIN) 250 MCG tablet Take 250 mcg by mouth daily.  Yes [provider]  vitamin E 1000 UNIT capsule Take 1,000 Units by mouth daily.   Yes [provider]  methenamine  (HIPREX ) 1 g tablet Take 1 tablet (1 g total) by mouth 2 (two) times daily. Prophylactic 01/22/24   [name], [name], MD    Allergies: Cortisone, Fentanyl , Hydromorphone , Iodinated contrast media, Keflex  [cephalexin ], Meperidine, Morphine, Oxycodone , Oxycodone -acetaminophen , Penicillins, Afluria preservative free [influenza virus vacc split pf], Aspirin , Ciprofloxacin  hcl, Codeine, Influenza vaccines, Influenza virus vaccine, Iohexol, Meperidine hcl, Misc. sulfonamide containing compounds, Oxycodone  hcl, Penicillamine, Shellfish allergy, Sulfa  antibiotics, Troleandomycin, Bisphosphonates, Ciprofloxacin , Iodine, Levaquin [levofloxacin in d5w], Levofloxacin, Morphine and codeine, Oxytetracycline, Percocet [oxycodone -acetaminophen ], and Sulfur    Review of Systems  Constitutional:  Positive for fatigue. Negative for fever.  HENT:  Negative for congestion and sore throat.   Eyes: Negative.   Respiratory:  Negative for chest tightness and shortness of breath.    Cardiovascular:  Negative for chest pain.  Gastrointestinal:  Negative for abdominal pain and nausea.  Genitourinary: Negative.   Musculoskeletal:  Negative for arthralgias, joint swelling and neck pain.  Skin: Negative.  Negative for rash and wound.  Neurological:  Negative for dizziness, weakness, light-headedness, numbness and headaches.  Psychiatric/Behavioral: Negative.      Updated Vital Signs BP (!) 98/57   Pulse 67   Temp 98 F (36.7 C) (Oral)   Ht 5' 6 (1.676 m)   Wt 72.1 kg   SpO2 95%   BMI 25.66 kg/m   Physical Exam Vitals and nursing note reviewed.  Constitutional:      Appearance: Normal appearance. She is well-developed.  HENT:     Head: Normocephalic and atraumatic.  Eyes:     Conjunctiva/sclera: Conjunctivae normal.  Cardiovascular:     Rate and Rhythm: Normal rate and regular rhythm.     Heart sounds: Normal heart sounds.  Pulmonary:     Effort: Pulmonary effort is normal.     Breath sounds: Normal breath sounds. No wheezing.  Abdominal:     General: Bowel sounds are normal.     Palpations: Abdomen is soft.     Tenderness: There is no abdominal tenderness. There is no guarding.  Musculoskeletal:        General: Normal range of motion.     Cervical back: Normal range of motion.  Skin:    General: Skin is warm and dry.  Neurological:     General: No focal deficit present.     Mental Status: She is alert and oriented to person, place, and time.  Psychiatric:        Mood and Affect: Mood normal.     (all labs ordered are listed, but only abnormal results are displayed) Labs Reviewed  CBC WITH DIFFERENTIAL/PLATELET - Abnormal; Notable for the following components:      Result Value   WBC 17.3 (*)    RBC 3.07 (*)    Hemoglobin 9.6 (*)    HCT 30.4 (*)    Platelets 511 (*)    Neutro Abs 13.8 (*)    Abs Immature Granulocytes 0.14 (*)    All other components within normal limits  COMPREHENSIVE METABOLIC PANEL WITH GFR - Abnormal; Notable for  the following components:   CO2 21 (*)    Glucose, Bld 122 (*)    Creatinine, Ser 1.36 (*)    Calcium  8.3 (*)    Total Protein 6.2 (*)    Albumin 2.3 (*)    AST 10 (*)    GFR, Estimated 40 (*)  All other components within normal limits  URINALYSIS, ROUTINE W REFLEX MICROSCOPIC - Abnormal; Notable for the following components:   Color, Urine ORANGE (*)    Glucose, UA 250 (*)    Hgb urine dipstick MODERATE (*)    Bilirubin Urine MODERATE (*)    Protein, ur 100 (*)    Nitrite POSITIVE (*)    Leukocytes,Ua LARGE (*)    All other components within normal limits  URINALYSIS, MICROSCOPIC (REFLEX) - Abnormal; Notable for the following components:   Bacteria, UA MANY (*)    All other components within normal limits  LIPASE, BLOOD  LACTIC ACID, PLASMA  TYPE AND SCREEN    EKG: None  Radiology: DG Chest Portable 1 View Result Date: 01/13/2024 CLINICAL DATA:  PICC placement EXAM: PORTABLE CHEST 1 VIEW COMPARISON:  Chest radiograph January 04, 2024 FINDINGS: Right upper extremity approach PICC catheter tip terminates in cavoatrial junction. The heart size is mildly enlarged. Increased interstitial marking of both lung fields. Mild basilar atelectasis of left lung base. No pleural effusion. No acute osseous abnormality. Degenerative changes of the spine. IMPRESSION: Right PICC in proper position. Electronically Signed   By: Megan  Zare M.D.   On: 01/13/2024 16:54     Procedures   Medications Ordered in the ED  lactated ringers  bolus 1,000 mL (1,000 mLs Intravenous Bolus 01/13/24 1737)                                    Medical Decision Making Patient presenting for reevaluation of anemia.  She was recently discharged from this hospital where she was treated for UTI and is currently receiving continued Invanz  daily through PICC line at her nursing facility.  She was also found to be anemic with a hemoglobin into the mid 6 range and had blood transfusions while here.  She was discharged with  a hemoglobin of 9.7, hemoglobin obtained at the nursing home was 9.0, so was sent here for possible need of another transfusion.  She is asymptomatic.  Her hemoglobin today is 9.6.  No indication for transfusion at this time.  No changes to her treatment plan going forward that were outlined the day of her discharge.  Amount and/or Complexity of Data Reviewed Labs: ordered.    Details: Labs reviewed, her CMET revealing a creatinine of 1.36 which is stable.  Her WBC count is 17.3, consistent with her current UTI infection.  Hemoglobin is 9.6 as mentioned which is stable.  Her urine still remains infected, review of her urine culture suggest she is on the right antibiotics, she has another 6 days of this treatment.  CT imaging was completed at recent hospitalization, no evidence for urinary obstruction.  She is urinating freely here with no evidence for retention.  Risk Decision regarding hospitalization. Risk Details: No indication for hospitalization at this time.        Final diagnoses:  Iron deficiency anemia, unspecified iron deficiency anemia type    ED Discharge Orders     None          Birdena Mliss RIGGERS 01/13/24 CLEOTIS    Melvenia Motto, MD 01/14/24 [phone]

## 2024-01-14 DIAGNOSIS — M069 Rheumatoid arthritis, unspecified: Secondary | ICD-10-CM | POA: Diagnosis not present

## 2024-01-14 DIAGNOSIS — M6281 Muscle weakness (generalized): Secondary | ICD-10-CM | POA: Diagnosis not present

## 2024-01-14 DIAGNOSIS — D649 Anemia, unspecified: Secondary | ICD-10-CM | POA: Diagnosis not present

## 2024-01-14 DIAGNOSIS — Z9119 Patient's noncompliance with other medical treatment and regimen due to financial hardship: Secondary | ICD-10-CM | POA: Diagnosis not present

## 2024-01-15 DIAGNOSIS — Z9119 Patient's noncompliance with other medical treatment and regimen due to financial hardship: Secondary | ICD-10-CM | POA: Diagnosis not present

## 2024-01-15 DIAGNOSIS — M069 Rheumatoid arthritis, unspecified: Secondary | ICD-10-CM | POA: Diagnosis not present

## 2024-01-15 DIAGNOSIS — M6281 Muscle weakness (generalized): Secondary | ICD-10-CM | POA: Diagnosis not present

## 2024-01-15 DIAGNOSIS — D649 Anemia, unspecified: Secondary | ICD-10-CM | POA: Diagnosis not present

## 2024-01-18 ENCOUNTER — Inpatient Hospital Stay: Attending: Hematology and Oncology | Admitting: Hematology and Oncology

## 2024-01-18 ENCOUNTER — Inpatient Hospital Stay

## 2024-01-18 DIAGNOSIS — M48061 Spinal stenosis, lumbar region without neurogenic claudication: Secondary | ICD-10-CM | POA: Insufficient documentation

## 2024-01-18 DIAGNOSIS — M069 Rheumatoid arthritis, unspecified: Secondary | ICD-10-CM | POA: Insufficient documentation

## 2024-01-18 DIAGNOSIS — Z8673 Personal history of transient ischemic attack (TIA), and cerebral infarction without residual deficits: Secondary | ICD-10-CM | POA: Insufficient documentation

## 2024-01-18 DIAGNOSIS — R110 Nausea: Secondary | ICD-10-CM | POA: Insufficient documentation

## 2024-01-18 DIAGNOSIS — Z801 Family history of malignant neoplasm of trachea, bronchus and lung: Secondary | ICD-10-CM | POA: Insufficient documentation

## 2024-01-18 DIAGNOSIS — Z803 Family history of malignant neoplasm of breast: Secondary | ICD-10-CM | POA: Insufficient documentation

## 2024-01-18 DIAGNOSIS — D649 Anemia, unspecified: Secondary | ICD-10-CM | POA: Insufficient documentation

## 2024-01-18 DIAGNOSIS — B952 Enterococcus as the cause of diseases classified elsewhere: Secondary | ICD-10-CM | POA: Insufficient documentation

## 2024-01-18 DIAGNOSIS — M5116 Intervertebral disc disorders with radiculopathy, lumbar region: Secondary | ICD-10-CM | POA: Insufficient documentation

## 2024-01-18 DIAGNOSIS — I119 Hypertensive heart disease without heart failure: Secondary | ICD-10-CM | POA: Insufficient documentation

## 2024-01-18 DIAGNOSIS — Z7969 Long term (current) use of other immunomodulators and immunosuppressants: Secondary | ICD-10-CM | POA: Insufficient documentation

## 2024-01-18 DIAGNOSIS — M47812 Spondylosis without myelopathy or radiculopathy, cervical region: Secondary | ICD-10-CM | POA: Insufficient documentation

## 2024-01-18 DIAGNOSIS — Z79899 Other long term (current) drug therapy: Secondary | ICD-10-CM | POA: Insufficient documentation

## 2024-01-18 DIAGNOSIS — Z7902 Long term (current) use of antithrombotics/antiplatelets: Secondary | ICD-10-CM | POA: Insufficient documentation

## 2024-01-18 DIAGNOSIS — G629 Polyneuropathy, unspecified: Secondary | ICD-10-CM | POA: Insufficient documentation

## 2024-01-18 DIAGNOSIS — M797 Fibromyalgia: Secondary | ICD-10-CM | POA: Insufficient documentation

## 2024-01-18 DIAGNOSIS — N390 Urinary tract infection, site not specified: Secondary | ICD-10-CM | POA: Insufficient documentation

## 2024-01-18 DIAGNOSIS — Z806 Family history of leukemia: Secondary | ICD-10-CM | POA: Insufficient documentation

## 2024-01-19 DIAGNOSIS — Z9119 Patient's noncompliance with other medical treatment and regimen due to financial hardship: Secondary | ICD-10-CM | POA: Diagnosis not present

## 2024-01-19 DIAGNOSIS — D649 Anemia, unspecified: Secondary | ICD-10-CM | POA: Diagnosis not present

## 2024-01-19 DIAGNOSIS — M069 Rheumatoid arthritis, unspecified: Secondary | ICD-10-CM | POA: Diagnosis not present

## 2024-01-19 DIAGNOSIS — M6281 Muscle weakness (generalized): Secondary | ICD-10-CM | POA: Diagnosis not present

## 2024-01-20 DIAGNOSIS — D649 Anemia, unspecified: Secondary | ICD-10-CM | POA: Diagnosis not present

## 2024-01-20 DIAGNOSIS — M069 Rheumatoid arthritis, unspecified: Secondary | ICD-10-CM | POA: Diagnosis not present

## 2024-01-21 DIAGNOSIS — D649 Anemia, unspecified: Secondary | ICD-10-CM | POA: Diagnosis not present

## 2024-01-21 DIAGNOSIS — M069 Rheumatoid arthritis, unspecified: Secondary | ICD-10-CM | POA: Diagnosis not present

## 2024-01-21 DIAGNOSIS — Z9119 Patient's noncompliance with other medical treatment and regimen due to financial hardship: Secondary | ICD-10-CM | POA: Diagnosis not present

## 2024-01-21 DIAGNOSIS — M6281 Muscle weakness (generalized): Secondary | ICD-10-CM | POA: Diagnosis not present

## 2024-01-22 DIAGNOSIS — M069 Rheumatoid arthritis, unspecified: Secondary | ICD-10-CM | POA: Diagnosis not present

## 2024-01-22 DIAGNOSIS — M6281 Muscle weakness (generalized): Secondary | ICD-10-CM | POA: Diagnosis not present

## 2024-01-22 DIAGNOSIS — R278 Other lack of coordination: Secondary | ICD-10-CM | POA: Diagnosis not present

## 2024-01-25 DIAGNOSIS — D649 Anemia, unspecified: Secondary | ICD-10-CM | POA: Diagnosis not present

## 2024-01-25 DIAGNOSIS — M6281 Muscle weakness (generalized): Secondary | ICD-10-CM | POA: Diagnosis not present

## 2024-01-25 DIAGNOSIS — M069 Rheumatoid arthritis, unspecified: Secondary | ICD-10-CM | POA: Diagnosis not present

## 2024-01-25 DIAGNOSIS — R278 Other lack of coordination: Secondary | ICD-10-CM | POA: Diagnosis not present

## 2024-01-26 DIAGNOSIS — M6281 Muscle weakness (generalized): Secondary | ICD-10-CM | POA: Diagnosis not present

## 2024-01-26 DIAGNOSIS — M069 Rheumatoid arthritis, unspecified: Secondary | ICD-10-CM | POA: Diagnosis not present

## 2024-01-26 DIAGNOSIS — R278 Other lack of coordination: Secondary | ICD-10-CM | POA: Diagnosis not present

## 2024-01-28 DIAGNOSIS — R278 Other lack of coordination: Secondary | ICD-10-CM | POA: Diagnosis not present

## 2024-01-28 DIAGNOSIS — M069 Rheumatoid arthritis, unspecified: Secondary | ICD-10-CM | POA: Diagnosis not present

## 2024-01-28 DIAGNOSIS — M6281 Muscle weakness (generalized): Secondary | ICD-10-CM | POA: Diagnosis not present

## 2024-01-29 DIAGNOSIS — M069 Rheumatoid arthritis, unspecified: Secondary | ICD-10-CM | POA: Diagnosis not present

## 2024-01-29 DIAGNOSIS — R278 Other lack of coordination: Secondary | ICD-10-CM | POA: Diagnosis not present

## 2024-01-29 DIAGNOSIS — M6281 Muscle weakness (generalized): Secondary | ICD-10-CM | POA: Diagnosis not present

## 2024-01-30 DIAGNOSIS — M069 Rheumatoid arthritis, unspecified: Secondary | ICD-10-CM | POA: Diagnosis not present

## 2024-01-30 DIAGNOSIS — M6281 Muscle weakness (generalized): Secondary | ICD-10-CM | POA: Diagnosis not present

## 2024-01-30 DIAGNOSIS — R278 Other lack of coordination: Secondary | ICD-10-CM | POA: Diagnosis not present

## 2024-02-01 ENCOUNTER — Inpatient Hospital Stay (HOSPITAL_BASED_OUTPATIENT_CLINIC_OR_DEPARTMENT_OTHER): Admitting: Physician Assistant

## 2024-02-01 ENCOUNTER — Inpatient Hospital Stay: Admitting: Physician Assistant

## 2024-02-01 ENCOUNTER — Encounter: Payer: Self-pay | Admitting: Physician Assistant

## 2024-02-01 VITALS — BP 138/74 | HR 78 | Temp 97.7°F | Resp 18

## 2024-02-01 DIAGNOSIS — M5116 Intervertebral disc disorders with radiculopathy, lumbar region: Secondary | ICD-10-CM | POA: Diagnosis not present

## 2024-02-01 DIAGNOSIS — Z801 Family history of malignant neoplasm of trachea, bronchus and lung: Secondary | ICD-10-CM | POA: Diagnosis not present

## 2024-02-01 DIAGNOSIS — G629 Polyneuropathy, unspecified: Secondary | ICD-10-CM | POA: Diagnosis not present

## 2024-02-01 DIAGNOSIS — Z7902 Long term (current) use of antithrombotics/antiplatelets: Secondary | ICD-10-CM | POA: Diagnosis not present

## 2024-02-01 DIAGNOSIS — D649 Anemia, unspecified: Secondary | ICD-10-CM

## 2024-02-01 DIAGNOSIS — R278 Other lack of coordination: Secondary | ICD-10-CM | POA: Diagnosis not present

## 2024-02-01 DIAGNOSIS — Z806 Family history of leukemia: Secondary | ICD-10-CM | POA: Diagnosis not present

## 2024-02-01 DIAGNOSIS — Z803 Family history of malignant neoplasm of breast: Secondary | ICD-10-CM | POA: Diagnosis not present

## 2024-02-01 DIAGNOSIS — Z8673 Personal history of transient ischemic attack (TIA), and cerebral infarction without residual deficits: Secondary | ICD-10-CM | POA: Diagnosis not present

## 2024-02-01 DIAGNOSIS — M47812 Spondylosis without myelopathy or radiculopathy, cervical region: Secondary | ICD-10-CM | POA: Diagnosis not present

## 2024-02-01 DIAGNOSIS — M797 Fibromyalgia: Secondary | ICD-10-CM | POA: Diagnosis not present

## 2024-02-01 DIAGNOSIS — M48061 Spinal stenosis, lumbar region without neurogenic claudication: Secondary | ICD-10-CM | POA: Diagnosis not present

## 2024-02-01 DIAGNOSIS — Z7969 Long term (current) use of other immunomodulators and immunosuppressants: Secondary | ICD-10-CM | POA: Diagnosis not present

## 2024-02-01 DIAGNOSIS — M6281 Muscle weakness (generalized): Secondary | ICD-10-CM | POA: Diagnosis not present

## 2024-02-01 DIAGNOSIS — Z79899 Other long term (current) drug therapy: Secondary | ICD-10-CM | POA: Diagnosis not present

## 2024-02-01 DIAGNOSIS — I119 Hypertensive heart disease without heart failure: Secondary | ICD-10-CM | POA: Diagnosis not present

## 2024-02-01 DIAGNOSIS — N390 Urinary tract infection, site not specified: Secondary | ICD-10-CM | POA: Diagnosis not present

## 2024-02-01 DIAGNOSIS — B952 Enterococcus as the cause of diseases classified elsewhere: Secondary | ICD-10-CM | POA: Diagnosis not present

## 2024-02-01 DIAGNOSIS — M069 Rheumatoid arthritis, unspecified: Secondary | ICD-10-CM | POA: Diagnosis not present

## 2024-02-01 DIAGNOSIS — R110 Nausea: Secondary | ICD-10-CM | POA: Diagnosis not present

## 2024-02-01 LAB — COMPREHENSIVE METABOLIC PANEL WITH GFR
ALT: 8 U/L (ref 0–44)
AST: 13 U/L — ABNORMAL LOW (ref 15–41)
Albumin: 2.9 g/dL — ABNORMAL LOW (ref 3.5–5.0)
Alkaline Phosphatase: 110 U/L (ref 38–126)
Anion gap: 8 (ref 5–15)
BUN: 34 mg/dL — ABNORMAL HIGH (ref 8–23)
CO2: 23 mmol/L (ref 22–32)
Calcium: 8.3 mg/dL — ABNORMAL LOW (ref 8.9–10.3)
Chloride: 102 mmol/L (ref 98–111)
Creatinine, Ser: 1.84 mg/dL — ABNORMAL HIGH (ref 0.44–1.00)
GFR, Estimated: 28 mL/min — ABNORMAL LOW (ref >60)
Glucose, Bld: 139 mg/dL — ABNORMAL HIGH (ref 70–99)
Potassium: 4.6 mmol/L (ref 3.5–5.1)
Sodium: 133 mmol/L — ABNORMAL LOW (ref 135–145)
Total Bilirubin: 0.6 mg/dL (ref 0.0–1.2)
Total Protein: 6.9 g/dL (ref 6.5–8.1)

## 2024-02-01 LAB — CBC WITH DIFFERENTIAL/PLATELET
Abs Immature Granulocytes: 0.07 K/uL (ref 0.00–0.07)
Basophils Absolute: 0.1 K/uL (ref 0.0–0.1)
Basophils Relative: 1 %
Eosinophils Absolute: 0.7 K/uL — ABNORMAL HIGH (ref 0.0–0.5)
Eosinophils Relative: 7 %
HCT: 31.9 % — ABNORMAL LOW (ref 36.0–46.0)
Hemoglobin: 10.2 g/dL — ABNORMAL LOW (ref 12.0–15.0)
Immature Granulocytes: 1 %
Lymphocytes Relative: 17 %
Lymphs Abs: 1.8 K/uL (ref 0.7–4.0)
MCH: 31.1 pg (ref 26.0–34.0)
MCHC: 32 g/dL (ref 30.0–36.0)
MCV: 97.3 fL (ref 80.0–100.0)
Monocytes Absolute: 0.9 K/uL (ref 0.1–1.0)
Monocytes Relative: 8 %
Neutro Abs: 7.1 K/uL (ref 1.7–7.7)
Neutrophils Relative %: 66 %
Platelets: 375 K/uL (ref 150–400)
RBC: 3.28 MIL/uL — ABNORMAL LOW (ref 3.87–5.11)
RDW: 14.8 % (ref 11.5–15.5)
WBC: 10.7 K/uL — ABNORMAL HIGH (ref 4.0–10.5)
nRBC: 0 % (ref 0.0–0.2)

## 2024-02-01 LAB — RETIC PANEL
Immature Retic Fract: 10.7 % (ref 2.3–15.9)
RBC.: 3.29 MIL/uL — ABNORMAL LOW (ref 3.87–5.11)
Retic Count, Absolute: 55.6 K/uL (ref 19.0–186.0)
Retic Ct Pct: 1.7 % (ref 0.4–3.1)
Reticulocyte Hemoglobin: 35.8 pg (ref >27.9)

## 2024-02-01 LAB — VITAMIN B12: Vitamin B-12: 617 pg/mL (ref 180–914)

## 2024-02-01 LAB — TECHNOLOGIST SMEAR REVIEW

## 2024-02-01 LAB — IRON AND TIBC
Iron: 39 ug/dL (ref 28–170)
Saturation Ratios: 15 % (ref 10.4–31.8)
TIBC: 261 ug/dL (ref 250–450)
UIBC: 222 ug/dL

## 2024-02-01 LAB — FERRITIN: Ferritin: 214 ng/mL (ref 11–307)

## 2024-02-01 LAB — FOLATE: Folate: 19.2 ng/mL (ref >5.9)

## 2024-02-01 NOTE — Progress Notes (Unsigned)
 Patient came for labs today. PICC line in place. Patient stated PICC line has been in place for 2 weeks and dressing has not been changed.   PICC line dressing changed per Protocol. Informed Pt Advocate with pt today that the facility needs to be changing the dressing weekly and flushing 2x week per protocol.   discharged home from clinic via wheelchair. Follow up as scheduled.

## 2024-02-02 DIAGNOSIS — M069 Rheumatoid arthritis, unspecified: Secondary | ICD-10-CM | POA: Diagnosis not present

## 2024-02-02 DIAGNOSIS — N130 Hydronephrosis with ureteropelvic junction obstruction: Secondary | ICD-10-CM | POA: Diagnosis not present

## 2024-02-02 DIAGNOSIS — M6281 Muscle weakness (generalized): Secondary | ICD-10-CM | POA: Diagnosis not present

## 2024-02-02 DIAGNOSIS — N952 Postmenopausal atrophic vaginitis: Secondary | ICD-10-CM | POA: Diagnosis not present

## 2024-02-02 DIAGNOSIS — N3020 Other chronic cystitis without hematuria: Secondary | ICD-10-CM | POA: Diagnosis not present

## 2024-02-02 DIAGNOSIS — R278 Other lack of coordination: Secondary | ICD-10-CM | POA: Diagnosis not present

## 2024-02-02 LAB — KAPPA/LAMBDA LIGHT CHAINS
Kappa free light chain: 88.6 mg/L — ABNORMAL HIGH (ref 3.3–19.4)
Kappa, lambda light chain ratio: 1.58 (ref 0.26–1.65)
Lambda free light chains: 56.2 mg/L — ABNORMAL HIGH (ref 5.7–26.3)

## 2024-02-02 LAB — HAPTOGLOBIN: Haptoglobin: 299 mg/dL (ref 42–346)

## 2024-02-02 NOTE — Progress Notes (Signed)
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 19:59    ASSESSMENT & PLAN Robin Blackburn is a 78 y.o. female who presents to the clinic for evaluation for normocytic anemia.   #Normocytic anemia: --Likely multifocal including CKD, iron deficiency, inflammatory process with RA and medication induced with leflunomide .  --Labs today to check CBC, CMP, retic panel, peripheral smear.  --Will check nutritional levels with iron panel, vitamin B12, folate and copper  levels. --Will check for hemolysis with haptoglobin --will check for paraprotinemia with SPEP/IFE and serum free light chains --Will follow up with patient by phone to review results and discuss next steps.  --Tentative RTC in 3 months with labs and APP visit.   Orders Placed This Encounter  Procedures   CBC with Differential    Standing Status:   Future    Number of Occurrences:   1    Expected Date:   02/01/2024    Expiration Date:   05/01/2024   Comprehensive metabolic panel    Standing Status:   Future    Number of Occurrences:   1    Expected Date:   02/01/2024    Expiration Date:   05/01/2024   Ferritin    Standing Status:   Future    Number of Occurrences:   1     Expected Date:   02/01/2024    Expiration Date:   05/01/2024   Vitamin B12    Standing Status:   Future    Number of Occurrences:   1    Expected Date:   02/01/2024    Expiration Date:   05/01/2024   Folate    Standing Status:   Future    Number of Occurrences:   1    Expected Date:   02/01/2024    Expiration Date:   05/01/2024   Iron and TIBC ([facility] ONLY)    Standing Status:   Future    Number of Occurrences:   1    Expected Date:   02/01/2024    Expiration Date:   05/01/2024   Retic Panel    Standing Status:   Future    Number of Occurrences:   1    Expected Date:   02/01/2024    Expiration Date:   05/01/2024   Haptoglobin    Standing Status:   Future    Number of Occurrences:   1    Expected Date:   02/01/2024    Expiration Date:   05/01/2024   Multiple Myeloma Panel (SPEP&IFE w/QIG)    Standing Status:   Future    Number of Occurrences:   1    Expected Date:   02/01/2024    Expiration Date:   05/01/2024   Kappa/lambda light chains    Standing Status:   Future    Number of Occurrences:   1    Expected Date:   02/01/2024    Expiration Date:   05/01/2024   Copper , serum    Standing Status:   Future    Number of Occurrences:   1    Expected Date:   02/01/2024    Expiration Date:   05/01/2024   Technologist smear review    Standing Status:   Future    Number of Occurrences:   1    Expected Date:   02/01/2024    Expiration Date:   05/01/2024    Clinical information::   anemia    All questions were answered. The patient knows to call the clinic with any problems, questions or concerns.  I have spent a total of 60 minutes minutes of face-to-face and non-face-to-face time, preparing to see the patient, obtaining and/or reviewing separately obtained history, performing a medically appropriate examination, counseling and educating the patient, ordering medications/tests/procedures, referring and communicating with other health care professionals, documenting  clinical information in the electronic health record, independently interpreting results and communicating results to the patient, and care coordination.   Johnston Police, PA-C Department of Hematology/Oncology [facility] at [facility]

## 2024-02-03 DIAGNOSIS — D649 Anemia, unspecified: Secondary | ICD-10-CM | POA: Diagnosis not present

## 2024-02-03 DIAGNOSIS — M069 Rheumatoid arthritis, unspecified: Secondary | ICD-10-CM | POA: Diagnosis not present

## 2024-02-03 DIAGNOSIS — R278 Other lack of coordination: Secondary | ICD-10-CM | POA: Diagnosis not present

## 2024-02-03 DIAGNOSIS — Z9119 Patient's noncompliance with other medical treatment and regimen due to financial hardship: Secondary | ICD-10-CM | POA: Diagnosis not present

## 2024-02-03 DIAGNOSIS — M6281 Muscle weakness (generalized): Secondary | ICD-10-CM | POA: Diagnosis not present

## 2024-02-03 LAB — MULTIPLE MYELOMA PANEL, SERUM
Albumin SerPl Elph-Mcnc: 2.8 g/dL — ABNORMAL LOW (ref 2.9–4.4)
Albumin/Glob SerPl: 0.8 (ref 0.7–1.7)
Alpha 1: 0.3 g/dL (ref 0.0–0.4)
Alpha2 Glob SerPl Elph-Mcnc: 1.2 g/dL — ABNORMAL HIGH (ref 0.4–1.0)
B-Globulin SerPl Elph-Mcnc: 1 g/dL (ref 0.7–1.3)
Gamma Glob SerPl Elph-Mcnc: 1.3 g/dL (ref 0.4–1.8)
Globulin, Total: 3.7 g/dL (ref 2.2–3.9)
IgA: 310 mg/dL (ref 64–422)
IgG (Immunoglobin G), Serum: 1156 mg/dL (ref 586–1602)
IgM (Immunoglobulin M), Srm: 180 mg/dL (ref 26–217)
Total Protein ELP: 6.5 g/dL (ref 6.0–8.5)

## 2024-02-03 LAB — COPPER, SERUM: Copper: 154 ug/dL (ref 80–158)

## 2024-02-04 ENCOUNTER — Telehealth: Payer: Self-pay | Admitting: Physician Assistant

## 2024-02-04 DIAGNOSIS — M6281 Muscle weakness (generalized): Secondary | ICD-10-CM | POA: Diagnosis not present

## 2024-02-04 DIAGNOSIS — D649 Anemia, unspecified: Secondary | ICD-10-CM

## 2024-02-04 DIAGNOSIS — N3020 Other chronic cystitis without hematuria: Secondary | ICD-10-CM | POA: Diagnosis not present

## 2024-02-04 DIAGNOSIS — N952 Postmenopausal atrophic vaginitis: Secondary | ICD-10-CM | POA: Diagnosis not present

## 2024-02-04 DIAGNOSIS — N179 Acute kidney failure, unspecified: Secondary | ICD-10-CM | POA: Diagnosis not present

## 2024-02-04 DIAGNOSIS — N130 Hydronephrosis with ureteropelvic junction obstruction: Secondary | ICD-10-CM | POA: Diagnosis not present

## 2024-02-04 DIAGNOSIS — R278 Other lack of coordination: Secondary | ICD-10-CM | POA: Diagnosis not present

## 2024-02-04 DIAGNOSIS — I129 Hypertensive chronic kidney disease with stage 1 through stage 4 chronic kidney disease, or unspecified chronic kidney disease: Secondary | ICD-10-CM | POA: Diagnosis not present

## 2024-02-04 DIAGNOSIS — M069 Rheumatoid arthritis, unspecified: Secondary | ICD-10-CM | POA: Diagnosis not present

## 2024-02-04 DIAGNOSIS — D631 Anemia in chronic kidney disease: Secondary | ICD-10-CM | POA: Diagnosis not present

## 2024-02-04 MED ORDER — FERROUS SULFATE 325 (65 FE) MG PO TBEC: 325.0000 mg | DELAYED_RELEASE_TABLET | Freq: Every day | ORAL | 3 refills | Status: DC

## 2024-02-04 NOTE — Telephone Encounter (Signed)
 I called and spoke to [employer], Interior and spatial designer of nursing, at [facility] where Robin Blackburn currently resides. I reviewed the lab results from 02/01/2024. Leukocytosis has improved to 10.8, thrombocytosis has resolved and anemia has improved to 10.2. Rest of the workup ruled out vitamin b12/copper /folate deficiency, paraproteinemia and hemolysis. There is some iron deficiency so recommend PO ferrous sulfate  325 mg daily with a source of vitamin C. Will make referral to gastroenterology to evaluate underlying cause of iron deficiency.   Patient scheduled to return in 3 months with repeat labs.

## 2024-02-08 DIAGNOSIS — M069 Rheumatoid arthritis, unspecified: Secondary | ICD-10-CM | POA: Diagnosis not present

## 2024-02-08 DIAGNOSIS — R278 Other lack of coordination: Secondary | ICD-10-CM | POA: Diagnosis not present

## 2024-02-08 DIAGNOSIS — M6281 Muscle weakness (generalized): Secondary | ICD-10-CM | POA: Diagnosis not present

## 2024-02-09 DIAGNOSIS — R278 Other lack of coordination: Secondary | ICD-10-CM | POA: Diagnosis not present

## 2024-02-09 DIAGNOSIS — M069 Rheumatoid arthritis, unspecified: Secondary | ICD-10-CM | POA: Diagnosis not present

## 2024-02-10 DIAGNOSIS — R051 Acute cough: Secondary | ICD-10-CM | POA: Diagnosis not present

## 2024-02-10 DIAGNOSIS — M069 Rheumatoid arthritis, unspecified: Secondary | ICD-10-CM | POA: Diagnosis not present

## 2024-02-10 DIAGNOSIS — R278 Other lack of coordination: Secondary | ICD-10-CM | POA: Diagnosis not present

## 2024-02-10 DIAGNOSIS — M6281 Muscle weakness (generalized): Secondary | ICD-10-CM | POA: Diagnosis not present

## 2024-02-11 DIAGNOSIS — M6281 Muscle weakness (generalized): Secondary | ICD-10-CM | POA: Diagnosis not present

## 2024-02-11 DIAGNOSIS — M069 Rheumatoid arthritis, unspecified: Secondary | ICD-10-CM | POA: Diagnosis not present

## 2024-02-11 DIAGNOSIS — R278 Other lack of coordination: Secondary | ICD-10-CM | POA: Diagnosis not present

## 2024-02-12 DIAGNOSIS — N179 Acute kidney failure, unspecified: Secondary | ICD-10-CM | POA: Diagnosis not present

## 2024-02-12 DIAGNOSIS — D649 Anemia, unspecified: Secondary | ICD-10-CM | POA: Diagnosis not present

## 2024-02-12 DIAGNOSIS — I129 Hypertensive chronic kidney disease with stage 1 through stage 4 chronic kidney disease, or unspecified chronic kidney disease: Secondary | ICD-10-CM | POA: Diagnosis not present

## 2024-02-12 DIAGNOSIS — M069 Rheumatoid arthritis, unspecified: Secondary | ICD-10-CM | POA: Diagnosis not present

## 2024-02-12 DIAGNOSIS — R278 Other lack of coordination: Secondary | ICD-10-CM | POA: Diagnosis not present

## 2024-02-12 DIAGNOSIS — D631 Anemia in chronic kidney disease: Secondary | ICD-10-CM | POA: Diagnosis not present

## 2024-02-13 DIAGNOSIS — R278 Other lack of coordination: Secondary | ICD-10-CM | POA: Diagnosis not present

## 2024-02-13 DIAGNOSIS — M069 Rheumatoid arthritis, unspecified: Secondary | ICD-10-CM | POA: Diagnosis not present

## 2024-02-13 DIAGNOSIS — M6281 Muscle weakness (generalized): Secondary | ICD-10-CM | POA: Diagnosis not present

## 2024-02-14 DIAGNOSIS — R059 Cough, unspecified: Secondary | ICD-10-CM | POA: Diagnosis not present

## 2024-02-15 DIAGNOSIS — M069 Rheumatoid arthritis, unspecified: Secondary | ICD-10-CM | POA: Diagnosis not present

## 2024-02-15 DIAGNOSIS — R278 Other lack of coordination: Secondary | ICD-10-CM | POA: Diagnosis not present

## 2024-02-16 DIAGNOSIS — R278 Other lack of coordination: Secondary | ICD-10-CM | POA: Diagnosis not present

## 2024-02-16 DIAGNOSIS — I129 Hypertensive chronic kidney disease with stage 1 through stage 4 chronic kidney disease, or unspecified chronic kidney disease: Secondary | ICD-10-CM | POA: Diagnosis not present

## 2024-02-16 DIAGNOSIS — M6281 Muscle weakness (generalized): Secondary | ICD-10-CM | POA: Diagnosis not present

## 2024-02-16 DIAGNOSIS — D631 Anemia in chronic kidney disease: Secondary | ICD-10-CM | POA: Diagnosis not present

## 2024-02-16 DIAGNOSIS — N179 Acute kidney failure, unspecified: Secondary | ICD-10-CM | POA: Diagnosis not present

## 2024-02-16 DIAGNOSIS — M069 Rheumatoid arthritis, unspecified: Secondary | ICD-10-CM | POA: Diagnosis not present

## 2024-02-17 ENCOUNTER — Encounter (INDEPENDENT_AMBULATORY_CARE_PROVIDER_SITE_OTHER): Payer: Self-pay | Admitting: *Deleted

## 2024-02-17 ENCOUNTER — Ambulatory Visit: Admitting: Emergency Medicine

## 2024-02-17 DIAGNOSIS — M069 Rheumatoid arthritis, unspecified: Secondary | ICD-10-CM | POA: Diagnosis not present

## 2024-02-17 DIAGNOSIS — R278 Other lack of coordination: Secondary | ICD-10-CM | POA: Diagnosis not present

## 2024-02-18 ENCOUNTER — Encounter: Payer: Self-pay | Admitting: Emergency Medicine

## 2024-02-18 ENCOUNTER — Ambulatory Visit

## 2024-02-18 ENCOUNTER — Ambulatory Visit (INDEPENDENT_AMBULATORY_CARE_PROVIDER_SITE_OTHER): Admitting: Emergency Medicine

## 2024-02-18 VITALS — BP 110/70 | HR 74 | Ht 66.0 in | Wt 159.0 lb

## 2024-02-18 DIAGNOSIS — J189 Pneumonia, unspecified organism: Secondary | ICD-10-CM

## 2024-02-18 DIAGNOSIS — I700 Atherosclerosis of aorta: Secondary | ICD-10-CM | POA: Diagnosis not present

## 2024-02-18 DIAGNOSIS — R918 Other nonspecific abnormal finding of lung field: Secondary | ICD-10-CM | POA: Diagnosis not present

## 2024-02-18 DIAGNOSIS — Z452 Encounter for adjustment and management of vascular access device: Secondary | ICD-10-CM | POA: Diagnosis not present

## 2024-02-18 NOTE — Patient Instructions (Addendum)
 Please continue doxycycline  until completely gone Okay to continue use your albuterol  nebulizer treatment as needed for shortness of breath, coughing, chest tightness We will perform a chest x-ray today. You need to follow-up in our office in about 6 weeks with a chest x-ray. We reviewed your CT scan of the chest from July.  You need a repeat CT chest in July 2026.

## 2024-02-18 NOTE — Assessment & Plan Note (Signed)
 Resolving on CT chest.  Some residual scar present.  No new infiltrate, nodules.  Reassuring study.  I will repeat her CT in 1 year.  Suspect that her nodules are rheumatoid in nature.
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
## 2024-02-18 NOTE — Assessment & Plan Note (Signed)
 Clinical pneumonia precipitated by URI symptoms.  She is completing course of doxycycline .  I will check a baseline chest x-ray today and we will see her in about 6 weeks to ensure that she has improved.  Will repeat her chest x-ray at that time.  She can continue the albuterol  nebs as needed for now.

## 2024-02-19 ENCOUNTER — Telehealth: Payer: Self-pay

## 2024-02-19 DIAGNOSIS — M6281 Muscle weakness (generalized): Secondary | ICD-10-CM | POA: Diagnosis not present

## 2024-02-19 DIAGNOSIS — R278 Other lack of coordination: Secondary | ICD-10-CM | POA: Diagnosis not present

## 2024-02-19 DIAGNOSIS — M069 Rheumatoid arthritis, unspecified: Secondary | ICD-10-CM | POA: Diagnosis not present

## 2024-02-19 NOTE — Telephone Encounter (Signed)
 FYI per Dr. Shelah  ( was written on PT paper work not on LOV / just in case care facility calls for clarification).   OTC - Delsym cough syrup  5ml QH 12 hours as needed / follow as directed.

## 2024-02-22 DIAGNOSIS — M069 Rheumatoid arthritis, unspecified: Secondary | ICD-10-CM | POA: Diagnosis not present

## 2024-02-22 DIAGNOSIS — N179 Acute kidney failure, unspecified: Secondary | ICD-10-CM | POA: Diagnosis not present

## 2024-02-22 DIAGNOSIS — I129 Hypertensive chronic kidney disease with stage 1 through stage 4 chronic kidney disease, or unspecified chronic kidney disease: Secondary | ICD-10-CM | POA: Diagnosis not present

## 2024-02-22 DIAGNOSIS — D631 Anemia in chronic kidney disease: Secondary | ICD-10-CM | POA: Diagnosis not present

## 2024-02-22 DIAGNOSIS — R278 Other lack of coordination: Secondary | ICD-10-CM | POA: Diagnosis not present

## 2024-02-23 DIAGNOSIS — G4700 Insomnia, unspecified: Secondary | ICD-10-CM | POA: Diagnosis not present

## 2024-02-23 DIAGNOSIS — F32A Depression, unspecified: Secondary | ICD-10-CM | POA: Diagnosis not present

## 2024-02-23 DIAGNOSIS — F419 Anxiety disorder, unspecified: Secondary | ICD-10-CM | POA: Diagnosis not present

## 2024-02-23 DIAGNOSIS — R278 Other lack of coordination: Secondary | ICD-10-CM | POA: Diagnosis not present

## 2024-02-23 DIAGNOSIS — M069 Rheumatoid arthritis, unspecified: Secondary | ICD-10-CM | POA: Diagnosis not present

## 2024-02-24 DIAGNOSIS — R278 Other lack of coordination: Secondary | ICD-10-CM | POA: Diagnosis not present

## 2024-02-24 DIAGNOSIS — M069 Rheumatoid arthritis, unspecified: Secondary | ICD-10-CM | POA: Diagnosis not present

## 2024-02-25 ENCOUNTER — Encounter: Attending: Registered Nurse | Admitting: Registered Nurse

## 2024-02-25 ENCOUNTER — Encounter: Payer: Self-pay | Admitting: Registered Nurse

## 2024-02-25 VITALS — BP 124/75 | HR 67 | Ht 66.0 in | Wt 159.0 lb

## 2024-02-25 DIAGNOSIS — M542 Cervicalgia: Secondary | ICD-10-CM | POA: Diagnosis not present

## 2024-02-25 DIAGNOSIS — M48061 Spinal stenosis, lumbar region without neurogenic claudication: Secondary | ICD-10-CM | POA: Diagnosis not present

## 2024-02-25 DIAGNOSIS — M2550 Pain in unspecified joint: Secondary | ICD-10-CM | POA: Diagnosis not present

## 2024-02-25 DIAGNOSIS — M5412 Radiculopathy, cervical region: Secondary | ICD-10-CM | POA: Insufficient documentation

## 2024-02-25 DIAGNOSIS — M797 Fibromyalgia: Secondary | ICD-10-CM | POA: Insufficient documentation

## 2024-02-25 DIAGNOSIS — R278 Other lack of coordination: Secondary | ICD-10-CM | POA: Diagnosis not present

## 2024-02-25 DIAGNOSIS — G894 Chronic pain syndrome: Secondary | ICD-10-CM | POA: Insufficient documentation

## 2024-02-25 DIAGNOSIS — M069 Rheumatoid arthritis, unspecified: Secondary | ICD-10-CM | POA: Diagnosis not present

## 2024-02-25 NOTE — Progress Notes (Signed)
 Subjective:    Patient ID: Robin Blackburn, female    DOB: May 16, 1946, 78 y.o.   MRN: [id number]  HPI: Robin Blackburn is a 78 y.o. female who returns for follow up appointment for chronic pain and medication refill. She states her pain is located in her neck radiating into her bilateral shoulders, lower back, Fibro pain and generalized joint pain. She rates her pain 7. Her current exercise regime is walking with walker short distances and performing stretching exercises.  She was admitted to [facility] 01/04/2024 and discharged on 08/02 with Urinary tract Infection, discharge summary was reviewed.    Pain Inventory Average Pain 7 Pain Right Now 7 My pain is constant, sharp, burning, dull, tingling, and aching  In the last 24 hours, has pain interfered with the following? General activity 7 Relation with others 4 Enjoyment of life 6 What TIME of day is your pain at its worst? morning , daytime, evening, and night Sleep (in general) Fair  Pain is worse with: unsure Pain improves with: medication Relief from Meds: 6  Family History  Problem Relation Age of Onset   Breast cancer Mother    Hypertension Father    Transient ischemic attack Father    Lung cancer Father    Hypertension Brother    Leukemia Brother    Leukemia Brother    Leukemia Brother    CVA Maternal Grandfather    Social History   Socioeconomic History   Marital status: Divorced    Spouse name: Not on file   Number of children: 0   Years of education: 12   Highest education level: 12th grade  Occupational History   Not on file  Tobacco Use   Smoking status: Never    Passive exposure: Never   Smokeless tobacco: Never  Vaping Use   Vaping status: Never Used  Substance and Sexual Activity   Alcohol  use: No   Drug use: No   Sexual activity: Not Currently  Other Topics Concern   Not on file  Social History Narrative   Not on file   Social Drivers of Health   Financial Resource Strain: Low Risk   (01/06/2022)   Overall Financial Resource Strain (CARDIA)    Difficulty of Paying Living Expenses: Not hard at all  Food Insecurity: No Food Insecurity (01/04/2024)   Hunger Vital Sign    Worried About Running Out of Food in the Last Year: Never true    Ran Out of Food in the Last Year: Never true  Transportation Needs: No Transportation Needs (01/04/2024)   PRAPARE - Administrator, Civil Service (Medical): No    Lack of Transportation (Non-Medical): No  Physical Activity: Inactive (11/07/2022)   Received from [facility]   Exercise Vital Sign    On average, how many days per week do you engage in moderate to strenuous exercise (like a brisk walk)?: 0 days    On average, how many minutes do you engage in exercise at this level?: 0 min  Stress: No Stress Concern Present (01/06/2022)   [employer] of Occupational Health - Occupational Stress Questionnaire    Feeling of Stress : Not at all  Social Connections: Socially Isolated (01/04/2024)   Social Connection and Isolation Panel    Frequency of Communication with Friends and Family: Never    Frequency of Social Gatherings with Friends and Family: Never    Attends Religious Services: Never    Database administrator or Organizations:  No    Attends Banker Meetings: Never    Marital Status: Widowed   Past Surgical History:  Procedure Laterality Date   ABDOMINAL HYSTERECTOMY     ANKLE RECONSTRUCTION     APPENDECTOMY     BACK SURGERY     CHOLECYSTECTOMY     KNEE SURGERY     Past Surgical History:  Procedure Laterality Date   ABDOMINAL HYSTERECTOMY     ANKLE RECONSTRUCTION     APPENDECTOMY     BACK SURGERY     CHOLECYSTECTOMY     KNEE SURGERY     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   Depression    Fibromyalgia    Hypertension    Neuropathy    Rheumatoid arthritis (HCC)    Stroke (HCC) 02/17/14   BP  124/75   Pulse 67   Ht 5' 6 (1.676 m)   SpO2 93%   BMI 25.66 kg/m   Opioid Risk Score:   Fall Risk Score:  `1  Depression screen PHQ 2/9     02/25/2024    1:09 PM 02/01/2024    1:51 PM 12/15/2023    2:19 PM 10/13/2023    2:53 PM 04/16/2023    1:30 PM 10/02/2022    1:02 PM 07/03/2022    1:01 PM  Depression screen PHQ 2/9  Decreased Interest 1 0 0 2 0 0 0  Down, Depressed, Hopeless 1 0 0 2 0 0 0  PHQ - 2 Score 2 0 0 4 0 0 0  Altered sleeping    1     Tired, decreased energy    1     Change in appetite    0     Feeling bad or failure about yourself     0     Trouble concentrating    0     Moving slowly or fidgety/restless    0     Suicidal thoughts    0     PHQ-9 Score    6        Review of Systems  Respiratory:  Positive for chest tightness.        Has been treated for pneumonia and had blood transfusions  Musculoskeletal:  Positive for back pain.       Bil hands  Neurological:  Positive for weakness.  Psychiatric/Behavioral:  Positive for dysphoric mood.   All other systems reviewed and are negative.      Objective:   Physical Exam Vitals and nursing note reviewed.  Constitutional:      Appearance: Normal appearance.  Cardiovascular:     Rate and Rhythm: Normal rate and regular rhythm.     Pulses: Normal pulses.     Heart sounds: Normal heart sounds.  Pulmonary:     Effort: Pulmonary effort is normal.     Breath sounds: Normal breath sounds.  Genitourinary:    Comments: Foley with Amber Urine and sediment Musculoskeletal:     Comments: Normal Muscle Bulk and Muscle Testing Reveals:  Upper Extremities: Full ROM and Muscle Strength 5/5 Thoracic and Lumbar Hypersensitivity Lower Extremities: Decreased ROM and Muscle Strength 5/5 Bilateral Lower Extremities Flexion Produces Pain into her bilateral lower extremities Arrived in wheelchair    Skin:    General: Skin is warm and dry.  Neurological:     Mental Status: She is alert and oriented  to person, place, and  time.  Psychiatric:        Mood and Affect: Mood normal.        Behavior: Behavior normal.          Assessment & Plan:  Cervicalgia: Cervical Radiculitis: . Continue to Monitor. Continue current medication regimen. Continue to monitor. 02/24/2025 Right Shoulder: No complaints today, Continue to Monitor. 02/25/2024 Lumbar Spinal Stenosis: Continue HEP as Tolerated. Continue to Monitor. 02/25/2024 Fibromyalgia: Continue  Gabapentin  300 mg one capsule every 8 hours.,Continue Physical Therapy at [facility] with Physical Therapist. 02/25/2024 Chronic Pain Syndrome: Continue : Lidocaine  Patches and  Tramadol  50 mg one tablet every 8 hours as needed fpr pain #90. She lives in [facility], they are prescribing her Tramadol . They don't report to the PMP. 02/25/2024  Polyarthralgia: Continue HEP as tolerated. Continue to Monitor. 12/15/2023   F/U in 6 Months

## 2024-02-26 DIAGNOSIS — M069 Rheumatoid arthritis, unspecified: Secondary | ICD-10-CM | POA: Diagnosis not present

## 2024-02-26 DIAGNOSIS — R278 Other lack of coordination: Secondary | ICD-10-CM | POA: Diagnosis not present

## 2024-02-27 DIAGNOSIS — M6281 Muscle weakness (generalized): Secondary | ICD-10-CM | POA: Diagnosis not present

## 2024-02-27 DIAGNOSIS — M069 Rheumatoid arthritis, unspecified: Secondary | ICD-10-CM | POA: Diagnosis not present

## 2024-02-27 DIAGNOSIS — R278 Other lack of coordination: Secondary | ICD-10-CM | POA: Diagnosis not present

## 2024-02-29 DIAGNOSIS — M6281 Muscle weakness (generalized): Secondary | ICD-10-CM | POA: Diagnosis not present

## 2024-02-29 DIAGNOSIS — D631 Anemia in chronic kidney disease: Secondary | ICD-10-CM | POA: Diagnosis not present

## 2024-02-29 DIAGNOSIS — R278 Other lack of coordination: Secondary | ICD-10-CM | POA: Diagnosis not present

## 2024-02-29 DIAGNOSIS — M069 Rheumatoid arthritis, unspecified: Secondary | ICD-10-CM | POA: Diagnosis not present

## 2024-02-29 DIAGNOSIS — N179 Acute kidney failure, unspecified: Secondary | ICD-10-CM | POA: Diagnosis not present

## 2024-02-29 DIAGNOSIS — I129 Hypertensive chronic kidney disease with stage 1 through stage 4 chronic kidney disease, or unspecified chronic kidney disease: Secondary | ICD-10-CM | POA: Diagnosis not present

## 2024-03-01 DIAGNOSIS — J189 Pneumonia, unspecified organism: Secondary | ICD-10-CM | POA: Diagnosis not present

## 2024-03-01 DIAGNOSIS — R109 Unspecified abdominal pain: Secondary | ICD-10-CM | POA: Diagnosis not present

## 2024-03-01 DIAGNOSIS — M069 Rheumatoid arthritis, unspecified: Secondary | ICD-10-CM | POA: Diagnosis not present

## 2024-03-01 DIAGNOSIS — R278 Other lack of coordination: Secondary | ICD-10-CM | POA: Diagnosis not present

## 2024-03-02 DIAGNOSIS — R278 Other lack of coordination: Secondary | ICD-10-CM | POA: Diagnosis not present

## 2024-03-02 DIAGNOSIS — M069 Rheumatoid arthritis, unspecified: Secondary | ICD-10-CM | POA: Diagnosis not present

## 2024-03-03 DIAGNOSIS — N952 Postmenopausal atrophic vaginitis: Secondary | ICD-10-CM | POA: Diagnosis not present

## 2024-03-03 DIAGNOSIS — N130 Hydronephrosis with ureteropelvic junction obstruction: Secondary | ICD-10-CM | POA: Diagnosis not present

## 2024-03-03 DIAGNOSIS — N3020 Other chronic cystitis without hematuria: Secondary | ICD-10-CM | POA: Diagnosis not present

## 2024-03-04 DIAGNOSIS — M069 Rheumatoid arthritis, unspecified: Secondary | ICD-10-CM | POA: Diagnosis not present

## 2024-03-04 DIAGNOSIS — K5900 Constipation, unspecified: Secondary | ICD-10-CM | POA: Diagnosis not present

## 2024-03-04 DIAGNOSIS — Z8701 Personal history of pneumonia (recurrent): Secondary | ICD-10-CM | POA: Diagnosis not present

## 2024-03-04 DIAGNOSIS — K589 Irritable bowel syndrome without diarrhea: Secondary | ICD-10-CM | POA: Diagnosis not present

## 2024-03-07 ENCOUNTER — Telehealth (HOSPITAL_COMMUNITY): Payer: Self-pay

## 2024-03-07 ENCOUNTER — Other Ambulatory Visit (HOSPITAL_COMMUNITY): Payer: Self-pay

## 2024-03-07 DIAGNOSIS — R278 Other lack of coordination: Secondary | ICD-10-CM | POA: Diagnosis not present

## 2024-03-07 DIAGNOSIS — N1330 Unspecified hydronephrosis: Secondary | ICD-10-CM

## 2024-03-07 DIAGNOSIS — M069 Rheumatoid arthritis, unspecified: Secondary | ICD-10-CM | POA: Diagnosis not present

## 2024-03-07 NOTE — Telephone Encounter (Signed)
 Called [facility] to schedule sp tube placement, no answer, left vm. AB

## 2024-03-08 ENCOUNTER — Ambulatory Visit (INDEPENDENT_AMBULATORY_CARE_PROVIDER_SITE_OTHER): Admitting: Gastroenterology

## 2024-03-08 ENCOUNTER — Encounter (INDEPENDENT_AMBULATORY_CARE_PROVIDER_SITE_OTHER): Payer: Self-pay | Admitting: Gastroenterology

## 2024-03-08 DIAGNOSIS — K5900 Constipation, unspecified: Secondary | ICD-10-CM | POA: Diagnosis not present

## 2024-03-08 DIAGNOSIS — K589 Irritable bowel syndrome without diarrhea: Secondary | ICD-10-CM | POA: Diagnosis not present

## 2024-03-08 DIAGNOSIS — R5381 Other malaise: Secondary | ICD-10-CM | POA: Diagnosis not present

## 2024-03-08 DIAGNOSIS — M069 Rheumatoid arthritis, unspecified: Secondary | ICD-10-CM | POA: Diagnosis not present

## 2024-03-08 DIAGNOSIS — R278 Other lack of coordination: Secondary | ICD-10-CM | POA: Diagnosis not present

## 2024-03-08 DIAGNOSIS — M6281 Muscle weakness (generalized): Secondary | ICD-10-CM | POA: Diagnosis not present

## 2024-03-09 ENCOUNTER — Telehealth (HOSPITAL_COMMUNITY): Payer: Self-pay

## 2024-03-09 DIAGNOSIS — J069 Acute upper respiratory infection, unspecified: Secondary | ICD-10-CM | POA: Diagnosis not present

## 2024-03-09 DIAGNOSIS — M175 Other unilateral secondary osteoarthritis of knee: Secondary | ICD-10-CM | POA: Diagnosis not present

## 2024-03-09 DIAGNOSIS — M069 Rheumatoid arthritis, unspecified: Secondary | ICD-10-CM | POA: Diagnosis not present

## 2024-03-09 DIAGNOSIS — M6281 Muscle weakness (generalized): Secondary | ICD-10-CM | POA: Diagnosis not present

## 2024-03-09 DIAGNOSIS — M79674 Pain in right toe(s): Secondary | ICD-10-CM | POA: Diagnosis not present

## 2024-03-09 DIAGNOSIS — B351 Tinea unguium: Secondary | ICD-10-CM | POA: Diagnosis not present

## 2024-03-09 DIAGNOSIS — M79675 Pain in left toe(s): Secondary | ICD-10-CM | POA: Diagnosis not present

## 2024-03-09 DIAGNOSIS — K589 Irritable bowel syndrome without diarrhea: Secondary | ICD-10-CM | POA: Diagnosis not present

## 2024-03-09 NOTE — Telephone Encounter (Signed)
 Called [facility] again to schedule pt. The scheduler was out today so I left him a vm with my number and also Jennifer's to call back to schedule. AB

## 2024-03-10 ENCOUNTER — Inpatient Hospital Stay (HOSPITAL_COMMUNITY)
Admission: EM | Admit: 2024-03-10 | Discharge: 2024-03-15 | DRG: 683 | Disposition: A | Discharge status: Skilled Nursing Facility | Source: Ambulatory Visit | Attending: Internal Medicine | Admitting: Internal Medicine

## 2024-03-10 ENCOUNTER — Emergency Department (HOSPITAL_COMMUNITY)

## 2024-03-10 ENCOUNTER — Other Ambulatory Visit: Payer: Self-pay

## 2024-03-10 ENCOUNTER — Encounter (HOSPITAL_COMMUNITY): Payer: Self-pay

## 2024-03-10 DIAGNOSIS — Z882 Allergy status to sulfonamides status: Secondary | ICD-10-CM

## 2024-03-10 DIAGNOSIS — K589 Irritable bowel syndrome without diarrhea: Secondary | ICD-10-CM | POA: Diagnosis not present

## 2024-03-10 DIAGNOSIS — Z803 Family history of malignant neoplasm of breast: Secondary | ICD-10-CM

## 2024-03-10 DIAGNOSIS — R29818 Other symptoms and signs involving the nervous system: Secondary | ICD-10-CM | POA: Diagnosis not present

## 2024-03-10 DIAGNOSIS — Z806 Family history of leukemia: Secondary | ICD-10-CM

## 2024-03-10 DIAGNOSIS — R410 Disorientation, unspecified: Secondary | ICD-10-CM | POA: Diagnosis not present

## 2024-03-10 DIAGNOSIS — E875 Hyperkalemia: Secondary | ICD-10-CM | POA: Diagnosis not present

## 2024-03-10 DIAGNOSIS — Z887 Allergy status to serum and vaccine status: Secondary | ICD-10-CM

## 2024-03-10 DIAGNOSIS — Z880 Allergy status to penicillin: Secondary | ICD-10-CM | POA: Diagnosis not present

## 2024-03-10 DIAGNOSIS — N1832 Chronic kidney disease, stage 3b: Secondary | ICD-10-CM | POA: Diagnosis not present

## 2024-03-10 DIAGNOSIS — K5730 Diverticulosis of large intestine without perforation or abscess without bleeding: Secondary | ICD-10-CM | POA: Diagnosis not present

## 2024-03-10 DIAGNOSIS — R9089 Other abnormal findings on diagnostic imaging of central nervous system: Secondary | ICD-10-CM | POA: Diagnosis not present

## 2024-03-10 DIAGNOSIS — G894 Chronic pain syndrome: Secondary | ICD-10-CM | POA: Diagnosis present

## 2024-03-10 DIAGNOSIS — M797 Fibromyalgia: Secondary | ICD-10-CM | POA: Diagnosis present

## 2024-03-10 DIAGNOSIS — N179 Acute kidney failure, unspecified: Principal | ICD-10-CM | POA: Diagnosis present

## 2024-03-10 DIAGNOSIS — Z1152 Encounter for screening for COVID-19: Secondary | ICD-10-CM | POA: Diagnosis not present

## 2024-03-10 DIAGNOSIS — Z79899 Other long term (current) drug therapy: Secondary | ICD-10-CM

## 2024-03-10 DIAGNOSIS — G8911 Acute pain due to trauma: Secondary | ICD-10-CM | POA: Diagnosis not present

## 2024-03-10 DIAGNOSIS — Z8673 Personal history of transient ischemic attack (TIA), and cerebral infarction without residual deficits: Secondary | ICD-10-CM

## 2024-03-10 DIAGNOSIS — Z885 Allergy status to narcotic agent status: Secondary | ICD-10-CM

## 2024-03-10 DIAGNOSIS — E872 Acidosis, unspecified: Secondary | ICD-10-CM | POA: Diagnosis not present

## 2024-03-10 DIAGNOSIS — M069 Rheumatoid arthritis, unspecified: Secondary | ICD-10-CM | POA: Diagnosis not present

## 2024-03-10 DIAGNOSIS — D638 Anemia in other chronic diseases classified elsewhere: Secondary | ICD-10-CM | POA: Diagnosis not present

## 2024-03-10 DIAGNOSIS — N1831 Chronic kidney disease, stage 3a: Secondary | ICD-10-CM | POA: Diagnosis not present

## 2024-03-10 DIAGNOSIS — N170 Acute kidney failure with tubular necrosis: Principal | ICD-10-CM | POA: Diagnosis present

## 2024-03-10 DIAGNOSIS — E876 Hypokalemia: Secondary | ICD-10-CM | POA: Diagnosis present

## 2024-03-10 DIAGNOSIS — F419 Anxiety disorder, unspecified: Secondary | ICD-10-CM | POA: Diagnosis not present

## 2024-03-10 DIAGNOSIS — N320 Bladder-neck obstruction: Principal | ICD-10-CM | POA: Diagnosis present

## 2024-03-10 DIAGNOSIS — K219 Gastro-esophageal reflux disease without esophagitis: Secondary | ICD-10-CM | POA: Diagnosis present

## 2024-03-10 DIAGNOSIS — R471 Dysarthria and anarthria: Secondary | ICD-10-CM | POA: Diagnosis present

## 2024-03-10 DIAGNOSIS — Z9049 Acquired absence of other specified parts of digestive tract: Secondary | ICD-10-CM

## 2024-03-10 DIAGNOSIS — N1330 Unspecified hydronephrosis: Secondary | ICD-10-CM | POA: Diagnosis not present

## 2024-03-10 DIAGNOSIS — Z8249 Family history of ischemic heart disease and other diseases of the circulatory system: Secondary | ICD-10-CM | POA: Diagnosis not present

## 2024-03-10 DIAGNOSIS — F13232 Sedative, hypnotic or anxiolytic dependence with withdrawal with perceptual disturbance: Secondary | ICD-10-CM | POA: Diagnosis not present

## 2024-03-10 DIAGNOSIS — Z881 Allergy status to other antibiotic agents status: Secondary | ICD-10-CM

## 2024-03-10 DIAGNOSIS — M175 Other unilateral secondary osteoarthritis of knee: Secondary | ICD-10-CM | POA: Diagnosis not present

## 2024-03-10 DIAGNOSIS — E871 Hypo-osmolality and hyponatremia: Secondary | ICD-10-CM | POA: Diagnosis present

## 2024-03-10 DIAGNOSIS — N390 Urinary tract infection, site not specified: Secondary | ICD-10-CM | POA: Diagnosis not present

## 2024-03-10 DIAGNOSIS — Z7401 Bed confinement status: Secondary | ICD-10-CM | POA: Diagnosis not present

## 2024-03-10 DIAGNOSIS — Z7902 Long term (current) use of antithrombotics/antiplatelets: Secondary | ICD-10-CM

## 2024-03-10 DIAGNOSIS — N183 Chronic kidney disease, stage 3 unspecified: Secondary | ICD-10-CM | POA: Diagnosis not present

## 2024-03-10 DIAGNOSIS — E781 Pure hyperglyceridemia: Secondary | ICD-10-CM | POA: Diagnosis not present

## 2024-03-10 DIAGNOSIS — N136 Pyonephrosis: Secondary | ICD-10-CM | POA: Diagnosis present

## 2024-03-10 DIAGNOSIS — I10 Essential (primary) hypertension: Secondary | ICD-10-CM | POA: Diagnosis not present

## 2024-03-10 DIAGNOSIS — Z87440 Personal history of urinary (tract) infections: Secondary | ICD-10-CM

## 2024-03-10 DIAGNOSIS — I129 Hypertensive chronic kidney disease with stage 1 through stage 4 chronic kidney disease, or unspecified chronic kidney disease: Secondary | ICD-10-CM | POA: Diagnosis present

## 2024-03-10 DIAGNOSIS — Z823 Family history of stroke: Secondary | ICD-10-CM

## 2024-03-10 DIAGNOSIS — M6281 Muscle weakness (generalized): Secondary | ICD-10-CM | POA: Diagnosis not present

## 2024-03-10 DIAGNOSIS — Z886 Allergy status to analgesic agent status: Secondary | ICD-10-CM

## 2024-03-10 DIAGNOSIS — Z91041 Radiographic dye allergy status: Secondary | ICD-10-CM

## 2024-03-10 DIAGNOSIS — Z90710 Acquired absence of both cervix and uterus: Secondary | ICD-10-CM

## 2024-03-10 DIAGNOSIS — N139 Obstructive and reflux uropathy, unspecified: Secondary | ICD-10-CM | POA: Diagnosis not present

## 2024-03-10 DIAGNOSIS — N181 Chronic kidney disease, stage 1: Secondary | ICD-10-CM | POA: Diagnosis not present

## 2024-03-10 DIAGNOSIS — Z801 Family history of malignant neoplasm of trachea, bronchus and lung: Secondary | ICD-10-CM

## 2024-03-10 DIAGNOSIS — N182 Chronic kidney disease, stage 2 (mild): Secondary | ICD-10-CM

## 2024-03-10 DIAGNOSIS — D631 Anemia in chronic kidney disease: Secondary | ICD-10-CM | POA: Diagnosis present

## 2024-03-10 DIAGNOSIS — Z888 Allergy status to other drugs, medicaments and biological substances status: Secondary | ICD-10-CM

## 2024-03-10 DIAGNOSIS — Z91013 Allergy to seafood: Secondary | ICD-10-CM

## 2024-03-10 LAB — CBC WITH DIFFERENTIAL/PLATELET
Abs Immature Granulocytes: 0.16 K/uL — ABNORMAL HIGH (ref 0.00–0.07)
Basophils Absolute: 0.1 K/uL (ref 0.0–0.1)
Basophils Relative: 1 %
Eosinophils Absolute: 0.2 K/uL (ref 0.0–0.5)
Eosinophils Relative: 2 %
HCT: 27 % — ABNORMAL LOW (ref 36.0–46.0)
Hemoglobin: 7.9 g/dL — ABNORMAL LOW (ref 12.0–15.0)
Immature Granulocytes: 1 %
Lymphocytes Relative: 10 %
Lymphs Abs: 1.4 K/uL (ref 0.7–4.0)
MCH: 30.6 pg (ref 26.0–34.0)
MCHC: 29.3 g/dL — ABNORMAL LOW (ref 30.0–36.0)
MCV: 104.7 fL — ABNORMAL HIGH (ref 80.0–100.0)
Monocytes Absolute: 1 K/uL (ref 0.1–1.0)
Monocytes Relative: 8 %
Neutro Abs: 10.3 K/uL — ABNORMAL HIGH (ref 1.7–7.7)
Neutrophils Relative %: 78 %
Platelets: 486 K/uL — ABNORMAL HIGH (ref 150–400)
RBC: 2.58 MIL/uL — ABNORMAL LOW (ref 3.87–5.11)
RDW: 14.8 % (ref 11.5–15.5)
WBC: 13.1 K/uL — ABNORMAL HIGH (ref 4.0–10.5)
nRBC: 0 % (ref 0.0–0.2)

## 2024-03-10 LAB — GLUCOSE, CAPILLARY: Glucose-Capillary: 117 mg/dL — ABNORMAL HIGH (ref 70–99)

## 2024-03-10 LAB — COMPREHENSIVE METABOLIC PANEL WITH GFR
ALT: <5 U/L (ref 0–44)
AST: 14 U/L — ABNORMAL LOW (ref 15–41)
Albumin: 3 g/dL — ABNORMAL LOW (ref 3.5–5.0)
Alkaline Phosphatase: 210 U/L — ABNORMAL HIGH (ref 38–126)
Anion gap: 17 — ABNORMAL HIGH (ref 5–15)
BUN: 82 mg/dL — ABNORMAL HIGH (ref 8–23)
CO2: 13 mmol/L — ABNORMAL LOW (ref 22–32)
Calcium: 7.9 mg/dL — ABNORMAL LOW (ref 8.9–10.3)
Chloride: 97 mmol/L — ABNORMAL LOW (ref 98–111)
Creatinine, Ser: 6.28 mg/dL — ABNORMAL HIGH (ref 0.44–1.00)
GFR, Estimated: 6 mL/min — ABNORMAL LOW (ref >60)
Glucose, Bld: 128 mg/dL — ABNORMAL HIGH (ref 70–99)
Potassium: 6.2 mmol/L — ABNORMAL HIGH (ref 3.5–5.1)
Sodium: 127 mmol/L — ABNORMAL LOW (ref 135–145)
Total Bilirubin: 0.4 mg/dL (ref 0.0–1.2)
Total Protein: 7.4 g/dL (ref 6.5–8.1)

## 2024-03-10 LAB — TROPONIN T, HIGH SENSITIVITY
Troponin T High Sensitivity: 25 ng/L — ABNORMAL HIGH (ref 0–19)
Troponin T High Sensitivity: 24 ng/L — ABNORMAL HIGH (ref 0–19)

## 2024-03-10 LAB — URINALYSIS, ROUTINE W REFLEX MICROSCOPIC
Bilirubin Urine: NEGATIVE
Glucose, UA: NEGATIVE mg/dL
Hgb urine dipstick: NEGATIVE
Ketones, ur: NEGATIVE mg/dL
Nitrite: NEGATIVE
Protein, ur: 100 mg/dL — AB
Specific Gravity, Urine: 1.01 (ref 1.005–1.030)
WBC, UA: >50 WBC/hpf (ref 0–5)
pH: 5 (ref 5.0–8.0)

## 2024-03-10 LAB — OSMOLALITY: Osmolality: 305 mosm/kg — ABNORMAL HIGH (ref 275–295)

## 2024-03-10 LAB — RESP PANEL BY RT-PCR (RSV, FLU A&B, COVID)  RVPGX2
Influenza A by PCR: NEGATIVE
Influenza B by PCR: NEGATIVE
Resp Syncytial Virus by PCR: NEGATIVE
SARS Coronavirus 2 by RT PCR: NEGATIVE

## 2024-03-10 LAB — CK: Total CK: 69 U/L (ref 38–234)

## 2024-03-10 LAB — MRSA NEXT GEN BY PCR, NASAL: MRSA by PCR Next Gen: DETECTED — AB

## 2024-03-10 LAB — CREATININE, URINE, RANDOM: Creatinine, Urine: 46 mg/dL

## 2024-03-10 MED ORDER — GABAPENTIN 300 MG PO CAPS
300.0000 mg | ORAL_CAPSULE | Freq: Three times a day (TID) | ORAL | Status: DC
  Administered 2024-03-10 – 2024-03-15 (×14): 300 mg via ORAL
  Filled 2024-03-10 (×14): qty 1

## 2024-03-10 MED ORDER — TRAZODONE HCL 50 MG PO TABS
100.0000 mg | ORAL_TABLET | Freq: Every day | ORAL | Status: DC
  Administered 2024-03-10 – 2024-03-14 (×5): 100 mg via ORAL
  Filled 2024-03-10 (×5): qty 2

## 2024-03-10 MED ORDER — SODIUM CHLORIDE 0.9 % IV SOLN: INTRAVENOUS | Status: DC

## 2024-03-10 MED ORDER — CHLORHEXIDINE GLUCONATE CLOTH 2 % EX PADS
6.0000 | MEDICATED_PAD | Freq: Every day | CUTANEOUS | Status: DC
  Administered 2024-03-10 – 2024-03-15 (×5): 6 via TOPICAL

## 2024-03-10 MED ORDER — SODIUM CHLORIDE 0.9 % IV SOLN
500.0000 mg | INTRAVENOUS | Status: AC
  Administered 2024-03-11 – 2024-03-12 (×2): 500 mg via INTRAVENOUS
  Filled 2024-03-10 (×3): qty 10

## 2024-03-10 MED ORDER — ACETAMINOPHEN 325 MG PO TABS
650.0000 mg | ORAL_TABLET | Freq: Four times a day (QID) | ORAL | Status: DC | PRN
  Administered 2024-03-10 – 2024-03-14 (×10): 650 mg via ORAL
  Filled 2024-03-10 (×9): qty 2

## 2024-03-10 MED ORDER — SODIUM ZIRCONIUM CYCLOSILICATE 10 G PO PACK
10.0000 g | PACK | Freq: Every day | ORAL | Status: DC
  Administered 2024-03-10 – 2024-03-13 (×4): 10 g via ORAL
  Filled 2024-03-10: qty 1
  Filled 2024-03-10: qty 2
  Filled 2024-03-10 (×3): qty 1

## 2024-03-10 MED ORDER — TICAGRELOR 60 MG PO TABS
60.0000 mg | ORAL_TABLET | Freq: Every day | ORAL | Status: DC
  Filled 2024-03-10 (×2): qty 1

## 2024-03-10 MED ORDER — ONDANSETRON HCL 4 MG PO TABS
4.0000 mg | ORAL_TABLET | Freq: Four times a day (QID) | ORAL | Status: DC | PRN
  Administered 2024-03-12 – 2024-03-13 (×3): 4 mg via ORAL
  Filled 2024-03-10 (×4): qty 1

## 2024-03-10 MED ORDER — ACETAMINOPHEN 650 MG RE SUPP: 650.0000 mg | Freq: Four times a day (QID) | RECTAL | Status: DC | PRN

## 2024-03-10 MED ORDER — SODIUM CHLORIDE 0.9 % IV SOLN
1.0000 g | Freq: Once | INTRAVENOUS | Status: AC
  Administered 2024-03-10: 1 g via INTRAVENOUS
  Filled 2024-03-10: qty 20

## 2024-03-10 MED ORDER — DEXTROSE 50 % IV SOLN
50.0000 mL | Freq: Once | INTRAVENOUS | Status: AC
  Administered 2024-03-10: 50 mL via INTRAVENOUS
  Filled 2024-03-10: qty 50

## 2024-03-10 MED ORDER — ALPRAZOLAM 0.5 MG PO TABS
0.5000 mg | ORAL_TABLET | Freq: Two times a day (BID) | ORAL | Status: DC
  Administered 2024-03-10 – 2024-03-15 (×10): 0.5 mg via ORAL
  Filled 2024-03-10 (×10): qty 1

## 2024-03-10 MED ORDER — SODIUM CHLORIDE 0.9 % IV BOLUS
1000.0000 mL | Freq: Once | INTRAVENOUS | Status: AC
  Administered 2024-03-10: 1000 mL via INTRAVENOUS

## 2024-03-10 MED ORDER — INSULIN ASPART 100 UNIT/ML IJ SOLN
10.0000 [IU] | Freq: Once | INTRAMUSCULAR | Status: AC
  Administered 2024-03-10: 10 [IU] via SUBCUTANEOUS

## 2024-03-10 MED ORDER — HEPARIN SODIUM (PORCINE) 5000 UNIT/ML IJ SOLN
5000.0000 [IU] | Freq: Three times a day (TID) | INTRAMUSCULAR | Status: DC
Start: 2024-03-10 — End: 2024-03-11
  Administered 2024-03-10 – 2024-03-11 (×2): 5000 [IU] via SUBCUTANEOUS
  Filled 2024-03-10 (×3): qty 1

## 2024-03-10 MED ORDER — ONDANSETRON HCL 4 MG/2ML IJ SOLN
4.0000 mg | Freq: Four times a day (QID) | INTRAMUSCULAR | Status: DC | PRN
  Administered 2024-03-10 – 2024-03-14 (×4): 4 mg via INTRAVENOUS
  Filled 2024-03-10 (×4): qty 2

## 2024-03-10 MED ORDER — POLYETHYLENE GLYCOL 3350 17 G PO PACK: 17.0000 g | PACK | Freq: Every day | ORAL | Status: DC | PRN

## 2024-03-10 MED ORDER — INSULIN ASPART 100 UNIT/ML IJ SOLN: 5.0000 [IU] | Freq: Once | INTRAMUSCULAR | Status: DC

## 2024-03-10 NOTE — ED Notes (Signed)
 pt requested nurse contact Hart Law, [phone]; pt states this person is her POA and requested she be informed of her admission to ED. Nurse attempted to call, was unsuccessful.

## 2024-03-10 NOTE — ED Notes (Signed)
 RN unable to get PICC line to draw back blood at this time. RN will attempt to start PIV.

## 2024-03-10 NOTE — ED Triage Notes (Signed)
 Pt brb EMS from [facility] for Abnormal Labs. All V/S WDL. No other complaints at this time.

## 2024-03-10 NOTE — ED Provider Notes (Signed)
 [facility] EMERGENC[facility] DEPARTMENT AT [facility] Provider Note   CSN: [id number] Arrival date & time: 03/10/24  1331     Patient presents with: Abnormal Labs   Robin Blackburn is a 78 y.o. female who presents via EMS from [facility] for  abnormal labs.  Patient reports that over the past few days she has had generalized malaise with myalgias, sore throat, nasal congestion.  No cough.  She denies any chest pain, shortness of breath.  Patient had a recent admission for encephalopathy secondary to UTI.  She had urine cultures positive for ESBL Klebsiella and was discharged to her nursing facility with a PICC line with plans to receive ertapenem  IV through August 12.  She was noted to be significantly anemic and received 2 units of PRBCs at that time.  Patient reports that she has been having some persistent dysuria and increased urinary frequency.  She endorses some suprapubic abdominal discomfort without vomiting or diarrhea.   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 Authorizing Provider  acetaminophen  (TYLENOL ) 500 MG tablet Take 1,000 mg by mouth every 6 (six) hours as needed for mild pain (pain score 1-3) or moderate pain (pain score 4-6).    [provider]  Adalimumab (HUMIRA, 2 PEN,) 40 MG/0.8ML PNKT Inject 40 mg into the skin every 14 (fourteen) days.    [provider]  ALPRAZolam  (XANAX ) 0.5 MG tablet Take 1 tablet (0.5 mg total) by mouth 2 (two) times daily as needed for  anxiety. Patient taking differently: Take 0.5 mg by mouth 2 (two) times daily. 01/09/24   [name], [name], MD  amLODipine  (NORVASC ) 2.5 MG tablet Take 2.5 mg by mouth daily. 12/10/23   [provider]  ascorbic acid (VITAMIN C) 500 MG tablet Take 500 mg by mouth daily.    [provider]  chlorhexidine  (PERIDEX ) 0.12 % solution Use as directed 15 mLs in the mouth or throat 2 (two) times daily.    [provider]  Cholecalciferol  5000 units TABS Take 1 tablet by mouth daily.     [provider]  Cranberry 450 MG TABS Take 1 tablet by mouth in the morning and at bedtime. For UTI    [provider]  cycloSPORINE  (RESTASIS ) 0.05 % ophthalmic emulsion Place 1 drop into both eyes 2 (two) times daily. Dry eyes    [provider]  diclofenac  Sodium (VOLTAREN ) 1 % GEL Apply 2 g topically in the morning and at bedtime. Apply to posterior neck 05/20/22   [provider]  dicyclomine  (BENTYL ) 20 MG tablet Take 20 mg by mouth every 6 (six) hours. Abdominal cramps 01/31/14   [provider]  estradiol  (ESTRACE ) 0.1 MG/GM vaginal cream Place 1 Applicatorful vaginally every other day. Discard plastic applicator. Insert a blueberry size amount (approximately 1 gram) of cream on fingertip inside vagina  at bedtime every other night for long term use. 01/09/24   [name], MD  ferrous sulfate  325 (65 FE) MG EC tablet Take 1 tablet (325 mg total) by mouth daily with breakfast. 02/04/24   [name], PA-C  fluticasone (FLONASE) 50 MCG/ACT nasal spray Place 1 spray into both nostrils in the morning and at bedtime.    [provider]  folic acid  (FOLVITE ) 1 MG tablet Take 1 mg by mouth daily.    [provider]  gabapentin  (NEURONTIN ) 300 MG capsule Take 1 capsule (300 mg total) by mouth 3 (three) times daily as needed (neuropathy pain symptoms). Patient taking differently: Take 300 mg by mouth 3 (three) times daily. 01/09/24    [name], [name], MD  guaiFENesin (MUCINEX) 600 MG 12 hr tablet Take 600 mg by mouth 2 (two) times daily as needed (congestion).    [provider]  lansoprazole (PREVACID) 30 MG capsule Take 60 mg by mouth 2 (two) times daily before a meal.    [provider]  leflunomide  (ARAVA ) 10 MG tablet Take 10 mg by mouth daily. 08/01/20   [provider]  lidocaine  (LIDODERM ) 5 % Place 1 patch onto the skin every 12 (twelve) hours. Remove & Discard patch within 12 hours or as directed 12/24/21   Debby Fidela CROME, NP  Lidocaine , Anorectal, (HEMORRHOIDAL RELIEF) 5 % CREA Apply 1 Application topically every 8 (eight) hours as needed (hemorrhoids).    [provider]  loperamide  (IMODIUM  A-D) 2 MG tablet Take 2 mg by mouth every 6 (six) hours as needed for diarrhea or loose stools.    [provider]  meclizine  (ANTIVERT ) 12.5 MG tablet Take 12.5 mg by mouth 3 (three) times daily.    [provider]  melatonin 5 MG TABS Take 10 mg by mouth at bedtime.    [provider]  methenamine  (HIPREX ) 1 g tablet Take 1 tablet (1 g total) by mouth 2 (two) times daily. Prophylactic 01/22/24   [name], [name], MD  methocarbamol  (ROBAXIN ) 500 MG tablet Take 500 mg by mouth every 6 (six) hours as needed for muscle spasms. 10/30/23   [provider]  metoprolol  tartrate (LOPRESSOR ) 25 MG tablet Take 0.5 tablets (12.5 mg total) by mouth 2 (two) times daily. 01/09/24   [name], MD  Multiple Vitamins-Minerals (PRESERVISION AREDS 2) CAPS Take 1 capsule by mouth in the morning and at bedtime.    [provider]  Nutritional Supplement LIQD Take 120 mLs by mouth daily at 6 PM. House supplement on [facility]    [provider]  phenazopyridine  (PYRIDIUM ) 200 MG tablet Take 200 mg by mouth every 8 (eight) hours as needed (urinary burning/pain). 12/28/23   [provider]  potassium chloride  SA (KLOR-CON  M) 10 MEQ tablet Take 1 tablet  (10 mEq total) by mouth daily. 01/10/24   [name], [name], MD  Probiotic, Lactobacillus, CAPS Take 1 capsule by mouth in the morning, at noon, and at bedtime.    [provider]  simethicone  (MYLICON) 125 MG chewable tablet Chew 125 mg by mouth every 6 (six) hours as needed for flatulence.    [provider]  ticagrelor  (BRILINTA ) 60 MG TABS tablet Take 60 mg by mouth daily.    [provider]  torsemide  (DEMADEX ) 20 MG tablet Take 20 mg by mouth daily. 08/31/23   [provider]  traMADol  (ULTRAM ) 50 MG tablet Take 1 tablet (50 mg total) by mouth every  8 (eight) hours as needed for severe pain (pain score 7-10). 01/09/24   [name], [name], MD  traZODone  (DESYREL ) 100 MG tablet Take 100 mg by mouth at bedtime. 02/25/23   [provider]  vitamin B-12 (CYANOCOBALAMIN ) 250 MCG tablet Take 250 mcg by mouth daily.    [provider]  vitamin E 1000 UNIT capsule Take 1,000 Units by mouth daily.    [provider]    Allergies: Cortisone, Fentanyl , Hydromorphone , Iodinated contrast media, Keflex  [cephalexin ], Meperidine, Morphine, Oxycodone , Oxycodone -acetaminophen , Penicillins, Afluria preservative free [influenza virus vacc split pf], Aspirin , Ciprofloxacin  hcl, Codeine, Influenza vaccines, Influenza virus vaccine, Iohexol, Meperidine hcl, Misc. sulfonamide containing compounds, Oxycodone  hcl, Penicillamine, Shellfish allergy, Sulfa  antibiotics, Troleandomycin, Bisphosphonates, Ciprofloxacin , Iodine, Levaquin [levofloxacin in d5w], Levofloxacin, Morphine and codeine, Oxytetracycline, Percocet [oxycodone -acetaminophen ], and Sulfur    Review of Systems  Musculoskeletal:  Positive for myalgias.    Updated Vital Signs BP 109/66   Pulse 81   Temp 98.3 F (36.8 C) (Oral)   Resp 18   Ht 5' 6 (1.676 m)   Wt 72 kg   SpO2 97%   BMI 25.62 kg/m   Physical Exam Vitals and nursing note reviewed.  Constitutional:      General: She is not  in acute distress.    Appearance: She is well-developed.  HENT:     Head: Normocephalic and atraumatic.  Eyes:     Conjunctiva/sclera: Conjunctivae normal.  Cardiovascular:     Rate and Rhythm: Normal rate and regular rhythm.     Heart sounds: No murmur heard. Pulmonary:     Effort: Pulmonary effort is normal. No respiratory distress.     Breath sounds: Normal breath sounds.  Abdominal:     Palpations: Abdomen is soft.     Tenderness: There is abdominal tenderness.     Comments: Mild suprapubic abdominal tenderness  Musculoskeletal:        General: No swelling.     Cervical back: Neck supple.  Skin:    General: Skin is warm and dry.     Capillary Refill: Capillary refill takes less than 2 seconds.  Neurological:     Mental Status: She is alert.  Psychiatric:        Mood and Affect: Mood normal.     (all labs ordered are listed, but only abnormal results are displayed) Labs Reviewed  CBC WITH DIFFERENTIAL/PLATELET - Abnormal; Notable for the following components:      Result Value   WBC 13.1 (*)    RBC 2.58 (*)    Hemoglobin 7.9 (*)    HCT 27.0 (*)    MCV 104.7 (*)    MCHC 29.3 (*)    Platelets 486 (*)    Neutro Abs 10.3 (*)    Abs Immature Granulocytes 0.16 (*)    All other components within normal limits  COMPREHENSIVE METABOLIC PANEL WITH GFR - Abnormal; Notable for the following components:   Sodium 127 (*)    Potassium 6.2 (*)    Chloride 97 (*)    CO2 13 (*)    Glucose, Bld 128 (*)    BUN 82 (*)    Creatinine, Ser 6.28 (*)    Calcium  7.9 (*)    Albumin 3.0 (*)    AST 14 (*)    Alkaline Phosphatase 210 (*)    GFR, Estimated 6 (*)    Anion gap 17 (*)    All other components within normal limits  TROPONIN T, HIGH SENSITIVITY - Abnormal; Notable  for the following components:   Troponin T High Sensitivity 25 (*)    All other components within normal limits  RESP PANEL BY RT-PCR (RSV, FLU A&B, COVID)  RVPGX2  URINE CULTURE  CK  URINALYSIS, ROUTINE W  REFLEX MICROSCOPIC  OCCULT BLOOD X 1 CARD TO LAB, STOOL  NA AND K (SODIUM & POTASSIUM), RAND UR  OSMOLALITY, URINE  OSMOLALITY  CREATININE, URINE, RANDOM  TROPONIN T, HIGH SENSITIVITY    EKG: None  Radiology: US  Renal Result Date: 03/10/2024 EXAM: US  Retroperitoneum Complete, Renal. CLINICAL HISTORY: acute kidney failure. TECHNIQUE: Real-time ultrasound of the retroperitoneum (complete) with image documentation. COMPARISON: CT dated 03/10/2024. FINDINGS: RIGHT KIDNEY: Right kidney measures 10.2 x 5.5 x 5.4 cm. Volume is 161 cc. Moderate hydroureteronephrosis, as on CT. No renal stone or mass visualized. LEFT KIDNEY: Left kidney measures 10.6 x 5.9 x 4.6 cm. Volume is 154 cc. Moderate hydroureteronephrosis, as on CT. No renal stone or mass visualized. BLADDER: Mild bladder wall thickening. Prevoid Bladder Volume of 42 cc. IMPRESSION: 1. Moderate bilateral hydroureteronephrosis. 2. Mild bladder wall thickening, likely due to bladder outlet obstruction. Electronically signed by: Rockey Kilts MD 03/10/2024 05:17 PM EDT RP Workstation: [id number]   CT Renal Stone Study Result Date: 03/10/2024 EXAM: CT UROGRAM 03/10/2024 03:58:52 PM TECHNIQUE: CT of the abdomen and pelvis was performed without the administration of intravenous contrast. Multiplanar reformatted images as well as MIP urogram images are provided for review. Automated exposure control, iterative reconstruction, and/or weight based adjustment of the mA/kV was utilized to reduce the radiation dose to as low as reasonably achievable. COMPARISON: 01/07/2024 CLINICAL HISTORY: acute kidney failure. Pt brb EMS from [facility] for Abnormal Labs. All V/S WDL. No other complaints at this time. FINDINGS: LOWER CHEST: Small bilateral pleural effusions, right greater than left. Minimal dependent lower lobe atelectasis. LIVER: The liver is unremarkable. GALLBLADDER AND BILE DUCTS: Gallbladder is unremarkable. No biliary ductal dilatation. SPLEEN: No  acute abnormality. PANCREAS: No acute abnormality. ADRENAL GLANDS: No acute abnormality. KIDNEYS, URETERS AND BLADDER: There is continued bilateral hydroureteronephrosis unchanged since prior exam. Stable bilateral mucosal thickening throughout the renal pelvis and bilateral ureters. Persistent bladder wall thickening with multiple bladder diverticula, consistent with sequela of bladder outlet obstruction. Perivesicular fat stranding could reflect superimposed cystitis. No stones in the kidneys or ureters. No perinephric or periureteral stranding. GI AND BOWEL: Stomach demonstrates no acute abnormality. Sigmoid diverticulosis without evidence of acute diverticulitis. Appendix is surgically absent. There is no bowel obstruction. PERITONEUM AND RETROPERITONEUM: No ascites. No free air. VASCULATURE: Aorta is normal in caliber. Aortic atherosclerosis. LYMPH NODES: No lymphadenopathy. REPRODUCTIVE ORGANS: No acute abnormality. BONES AND SOFT TISSUES: No acute osseous abnormality. No focal soft tissue abnormality. IMPRESSION: 1. Findings compatible with bladder outlet obstruction, with persistent bladder wall thickening and stable bilateral hydroureteronephrosis. Perivesicular Fat stranding and uroepithelial mural thickening consistent with cystitis and urinary tract infection. Please correlate with urinalysis. 2. No urinary tract calculi. 3. Sigmoid diverticulosis without diverticulitis. Electronically signed by: Ozell Daring MD 03/10/2024 04:05 PM EDT RP Workstation: [id number]     Procedures   Medications Ordered in the ED  sodium zirconium cyclosilicate  (LOKELMA ) packet 10 g (10 g Oral Given 03/10/24 1622)  Chlorhexidine  Gluconate Cloth 2 % PADS 6 each (has no administration in time range)  sodium chloride  0.9 % bolus 1,000 mL (1,000 mLs Intravenous New Bag/Given 03/10/24 1539)    Clinical Course as of 03/10/24 1738  Thu Mar 10, 2024  1430 Patient evaluated for abnormal labs with  complaints of  generalized malaise, sore throat, nasal congestion over the past few days.  Recently treated for ESBL UTI, continue to complain of dysuria increased frequency.  Additionally had transfusions of PRBC for anemia.  Patient is hemodynamically stable.  Her lungs are clear.  She does have some mild suprapubic abdominal tenderness.  Will repeat urine and obtain routine labs, respiratory panel, and EKG. [JT]  1527 CBC with Differential(!) Leukocytosis 13.1 uptrending, hemoglobin of 7.9 downtrending from 10 a month ago [JT]  1527 Comprehensive metabolic panel(!) New hyponatremia of 127, potassium of 6.2, bicarb of 13, BUN of 82, creatinine of 6.28, baseline of 1.2 GFR of 6, anion gap of 17 [JT]  1541 Resp panel by RT-PCR (RSV, Flu A&B, Covid) Anterior Nasal Swab Negative [JT]  1612 CT Renal Stone Study Findings compatible with bladder outlet obstruction with cystitis.  Will likely need a Foley. [JT]  [id number] Discussed patient with nephrology, Dr. Melia.  Recommended renal ultrasound, medically treat hyperkalemia, obtain urine electrolytes and urine culture.  Will evaluate in the a.m. [JT]  1724 Discussed patient with hospitalist, Dr. Pearlean agreed for admission. [JT]    Clinical Course User Index [JT] Donnajean Lynwood DEL, PA-C                                 Medical Decision Making Amount and/or Complexity of Data Reviewed Labs: ordered.   This patient presents to the ED with chief complaint(s) of abnormal labs.  The complaint involves an extensive differential diagnosis and also carries with it a high risk of complications and morbidity.   Pertinent past medical history as listed in HPI  The differential diagnosis includes  URI, UTI, electrolyte abnormality, ACS, pyelonephritis, nephrolithiasis, pneumonia Additional history obtained: Records reviewed Care Everywhere/External Records  Disposition:   Will be admitted for further workup and management.  Social Determinants of Health:   none  This  note was dictated with voice recognition software.  Despite best efforts at proofreading, errors may have occurred which can change the documentation meaning.       Final diagnoses:  None    ED Discharge Orders     None          Donnajean Lynwood DEL, PA-C 03/10/24 1738    Elnor Jayson LABOR, DO 03/17/24 2353

## 2024-03-10 NOTE — H&P (Addendum)
 History and Physical    Robin Blackburn FMW:[id number] DOB: 02-02-1946 DOA: 03/10/2024  PCP: Bertell Satterfield, MD   Patient coming from: [facility]  I have personally briefly reviewed patient's old medical records in [facility]  Chief Complaint: Abnormal labs  HPI: Robin Blackburn is a 78 y.o. female with medical history significant for rheumatoid arthritis, stroke, fibromyalgia, hypertension, chronic pain. Patient was brought to the ED with reports of abnormal lab.  Patient reports chronic poor oral intake because she does not like the food at the nursing home, she denies vomiting or diarrhea.  She reports no abdominal pain and pain with urination over the past few days.  She reports she has not been able to void well recently, she is unsure why.  ED Course: Temperature 98.3.  Heart rate 81-89.  Respirate rate 18.  Blood pressure systolic 107-115.  O2 sats 95% on room air. Creatinine elevated at 6.28.  BUN of 82. Anion gap of 17 serum bicarb of 13. WBC 13.1.  Sodium 127.  Potassium 6.2. 1 L bolus given.  Lokelma  10 g given.   CT renal stone study -bladder outlet obstruction, persistent bladder wall thickening stable bilateral hydroureteronephrosis, other findings which were consistent with cystitis and UTI.  Hospitalist to admit.   Review of Systems: As per HPI all other systems reviewed and negative.  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   Depression    Fibromyalgia    Hypertension    Neuropathy    Rheumatoid arthritis (HCC)    Stroke (HCC) 02/17/14    Past Surgical History:  Procedure Laterality Date   ABDOMINAL HYSTERECTOMY     ANKLE RECONSTRUCTION     APPENDECTOMY     BACK SURGERY     CHOLECYSTECTOMY     KNEE SURGERY       reports that she has never smoked. She has never been exposed to tobacco smoke. She has never used smokeless tobacco. She reports that she does not drink  alcohol  and does not use drugs.  Allergies  Allergen Reactions   Cortisone Anaphylaxis    Cardiovascular Arrest   Fentanyl  Anaphylaxis, Hives and Other (See Comments)    felt like I had demons in my head   Hydromorphone  Anaphylaxis and Nausea And Vomiting    GI Intolerance   Iodinated Contrast Media Anaphylaxis, Hives, Rash and Dermatitis    Respiratory Distress  Iodinated contrast media (substance)   Keflex  [Cephalexin ] Nausea And Vomiting   Meperidine Anaphylaxis and Shortness Of Breath    Respiratory Distress   Morphine Anaphylaxis, Nausea And Vomiting and Swelling    GI Intolerance   Oxycodone  Anaphylaxis   Oxycodone -Acetaminophen  Nausea And Vomiting, Rash, Swelling and Dermatitis    GI Intolerance, Mouth swelling.  acetaminophen  / oxycodone    Penicillins Anaphylaxis, Nausea And Vomiting and Other (See Comments)    Immediate rash, facial/tongue/throat swelling, SOB or lightheadedness with hypotension  Product containing penicillin (product)   Afluria Preservative Free [Influenza Virus Vacc Split Pf] Other (See Comments)    Unknown   Aspirin  Other (See Comments)    GI Intolerance   Ciprofloxacin  Hcl Other (See Comments)    Unknown    Codeine Other (See Comments)    Unknown    Influenza Vaccines Other (See Comments)    Unknown    Influenza Virus Vaccine Other (See Comments)    Unknown  Iohexol Other (See Comments)    Unknown   Meperidine Hcl Other (See Comments)    Unknown   Misc. Sulfonamide Containing Compounds Other (See Comments)    Unknown   Oxycodone  Hcl Other (See Comments)    Unknown   Penicillamine Other (See Comments)    Unknown    Shellfish Allergy Other (See Comments)    Glucosamine not an option Unknown    Sulfa  Antibiotics Nausea And Vomiting    Tolerates Bactrim  though   Troleandomycin Nausea And Vomiting and Swelling    GI Intolerance   Bisphosphonates Hives and Other (See Comments)    GI intolerance   Ciprofloxacin  Nausea Only and Rash    Iodine Hives   Levaquin [Levofloxacin In D5w] Nausea And Vomiting and Rash   Levofloxacin Hives and Other (See Comments)    Mental Status Changes, Confusion   Morphine And Codeine Rash   Oxytetracycline Nausea And Vomiting   Percocet [Oxycodone -Acetaminophen ] Swelling    Mouth swelling.   Sulfur Nausea And Vomiting, Rash and Dermatitis    GI Intolerance  sulfur    Family History  Problem Relation Age of Onset   Breast cancer Mother    Hypertension Father    Transient ischemic attack Father    Lung cancer Father    Hypertension Brother    Leukemia Brother    Leukemia Brother    Leukemia Brother    CVA Maternal Grandfather     Prior to Admission medications   Medication Sig Start Date End Date Taking? Authorizing Provider  acetaminophen  (TYLENOL ) 500 MG tablet Take 1,000 mg by mouth every 6 (six) hours as needed for mild pain (pain score 1-3) or moderate pain (pain score 4-6).    [provider]  Adalimumab (HUMIRA, 2 PEN,) 40 MG/0.8ML PNKT Inject 40 mg into the skin every 14 (fourteen) days.    [provider]  ALPRAZolam  (XANAX ) 0.5 MG tablet Take 1 tablet (0.5 mg total) by mouth 2 (two) times daily as needed for anxiety. Patient taking differently: Take 0.5 mg by mouth 2 (two) times daily. 01/09/24   [name], [name], MD  amLODipine  (NORVASC ) 2.5 MG tablet Take 2.5 mg by mouth daily. 12/10/23   [provider]  ascorbic acid (VITAMIN C) 500 MG tablet Take 500 mg by mouth daily.    [provider]  chlorhexidine  (PERIDEX ) 0.12 % solution Use as directed 15 mLs in the mouth or throat 2 (two) times daily.    [provider]  Cholecalciferol  5000 units TABS Take 1 tablet by mouth daily.     [provider]  Cranberry 450 MG TABS Take 1 tablet by mouth in the morning and at bedtime. For UTI    [provider]  cycloSPORINE  (RESTASIS ) 0.05 % ophthalmic emulsion Place 1 drop into both eyes 2 (two) times daily. Dry eyes     [provider]  diclofenac  Sodium (VOLTAREN ) 1 % GEL Apply 2 g topically in the morning and at bedtime. Apply to posterior neck 05/20/22   [provider]  dicyclomine  (BENTYL ) 20 MG tablet Take 20 mg by mouth every 6 (six) hours. Abdominal cramps 01/31/14   [provider]  estradiol  (ESTRACE ) 0.1 MG/GM vaginal cream Place 1 Applicatorful vaginally every other day. Discard plastic applicator. Insert a blueberry size amount (approximately 1 gram) of cream on fingertip inside vagina at bedtime every other night for long term use. 01/09/24   [name], [name], MD  ferrous sulfate  325 (65 FE) MG  EC tablet Take 1 tablet (325 mg total) by mouth daily with breakfast. 02/04/24   [name], PA-C  fluticasone (FLONASE) 50 MCG/ACT nasal spray Place 1 spray into both nostrils in the morning and at bedtime.    [provider]  folic acid  (FOLVITE ) 1 MG tablet Take 1 mg by mouth daily.    [provider]  gabapentin  (NEURONTIN ) 300 MG capsule Take 1 capsule (300 mg total) by mouth 3 (three) times daily as needed (neuropathy pain symptoms). Patient taking differently: Take 300 mg by mouth 3 (three) times daily. 01/09/24   [name], [name], MD  guaiFENesin (MUCINEX) 600 MG 12 hr tablet Take 600 mg by mouth 2 (two) times daily as needed (congestion).    [provider]  lansoprazole (PREVACID) 30 MG capsule Take 60 mg by mouth 2 (two) times daily before a meal.    [provider]  leflunomide  (ARAVA ) 10 MG tablet Take 10 mg by mouth daily. 08/01/20   [provider]  lidocaine  (LIDODERM ) 5 % Place 1 patch onto the skin every 12 (twelve) hours. Remove & Discard patch within 12 hours or as directed 12/24/21   Debby Fidela CROME, NP  Lidocaine , Anorectal, (HEMORRHOIDAL RELIEF) 5 % CREA Apply 1 Application topically every 8 (eight) hours as needed (hemorrhoids).    [provider]  loperamide  (IMODIUM  A-D) 2 MG tablet Take 2 mg by mouth  every 6 (six) hours as needed for diarrhea or loose stools.    [provider]  meclizine  (ANTIVERT ) 12.5 MG tablet Take 12.5 mg by mouth 3 (three) times daily.    [provider]  melatonin 5 MG TABS Take 10 mg by mouth at bedtime.    [provider]  methenamine  (HIPREX ) 1 g tablet Take 1 tablet (1 g total) by mouth 2 (two) times daily. Prophylactic 01/22/24   [name], [name], MD  methocarbamol  (ROBAXIN ) 500 MG tablet Take 500 mg by mouth every 6 (six) hours as needed for muscle spasms. 10/30/23   [provider]  metoprolol  tartrate (LOPRESSOR ) 25 MG tablet Take 0.5 tablets (12.5 mg total) by mouth 2 (two) times daily. 01/09/24   [name], MD  Multiple Vitamins-Minerals (PRESERVISION AREDS 2) CAPS Take 1 capsule by mouth in the morning and at bedtime.    [provider]  Nutritional Supplement LIQD Take 120 mLs by mouth daily at 6 PM. House supplement on [facility]    [provider]  phenazopyridine  (PYRIDIUM ) 200 MG tablet Take 200 mg by mouth every 8 (eight) hours as needed (urinary burning/pain). 12/28/23   [provider]  potassium chloride  SA (KLOR-CON  M) 10 MEQ tablet Take 1 tablet (10 mEq total) by mouth daily. 01/10/24   [name], [name], MD  Probiotic, Lactobacillus, CAPS Take 1 capsule by mouth in the morning, at noon, and at bedtime.    [provider]  simethicone  (MYLICON) 125 MG chewable tablet Chew 125 mg by mouth every 6 (six) hours as needed for flatulence.    [provider]  ticagrelor  (BRILINTA ) 60 MG TABS tablet Take 60 mg by mouth daily.    [provider]  torsemide  (DEMADEX ) 20 MG tablet Take 20 mg by mouth daily. 08/31/23   [provider]  traMADol  (ULTRAM ) 50 MG tablet Take 1 tablet (50 mg total) by mouth every 8 (eight) hours as needed for severe pain (pain score 7-10). 01/09/24   [name], [name], MD  traZODone  (DESYREL ) 100 MG tablet  Take 100 mg by mouth at bedtime.  02/25/23   [provider]  vitamin B-12 (CYANOCOBALAMIN ) 250 MCG tablet Take 250 mcg by mouth daily.    [provider]  vitamin E 1000 UNIT capsule Take 1,000 Units by mouth daily.    [provider]    Physical Exam: Vitals:   03/10/24 1343 03/10/24 1345 03/10/24 1530  BP: (!) 107/56  109/66  Pulse: 88  81  Resp: 18  18  Temp: 98.3 F (36.8 C)    TempSrc: Oral    SpO2: 95%  97%  Weight:  72 kg   Height:  5' 6 (1.676 m)     Constitutional: NAD, calm, comfortable Vitals:   03/10/24 1343 03/10/24 1345 03/10/24 1530  BP: (!) 107/56  109/66  Pulse: 88  81  Resp: 18  18  Temp: 98.3 F (36.8 C)    TempSrc: Oral    SpO2: 95%  97%  Weight:  72 kg   Height:  5' 6 (1.676 m)    Eyes: PERRL, lids and conjunctivae normal ENMT: Mucous membranes are moist.  Neck: normal, supple, no masses, no thyromegaly Respiratory: clear to auscultation bilaterally, no wheezing, no crackles. Normal respiratory effort. No accessory muscle use.  Cardiovascular: Regular rate and rhythm, no murmurs / rubs / gallops. No extremity edema. Extremities warm. Abdomen: Suprapubic tenderness, no masses palpated. No hepatosplenomegaly.  Musculoskeletal: no clubbing / cyanosis. No joint deformity upper and lower extremities.  Skin: no rashes, lesions, ulcers. No induration Neurologic: No facial asymmetry, moves extremities spontaneously, speech fluent Psychiatric: Normal judgment and insight. Alert and oriented x 3. Normal mood.   Labs on Admission: I have personally reviewed following labs and imaging studies  CBC: Recent Labs  Lab 03/10/24 1429  WBC 13.1*  NEUTROABS 10.3*  HGB 7.9*  HCT 27.0*  MCV 104.7*  PLT 486*   Basic Metabolic Panel: Recent Labs  Lab 03/10/24 1429  NA 127*  K 6.2*  CL 97*  CO2 13*  GLUCOSE 128*  BUN 82*  CREATININE 6.28*  CALCIUM  7.9*   GFR: Estimated Creatinine Clearance: 7.5 mL/min (A) (by C-G formula based on SCr of 6.28 mg/dL  (H)). Liver Function Tests: Recent Labs  Lab 03/10/24 1429  AST 14*  ALT <5  ALKPHOS 210*  BILITOT 0.4  PROT 7.4  ALBUMIN 3.0*   Cardiac Enzymes: Recent Labs  Lab 03/10/24 1429  CKTOTAL 69   Urine analysis:    Component Value Date/Time   COLORURINE ORANGE (A) 01/13/2024 1623   APPEARANCEUR CLEAR 01/13/2024 1623   APPEARANCEUR Cloudy (A) 02/10/2023 1454   LABSPEC 1.015 01/13/2024 1623   PHURINE 5.0 01/13/2024 1623   GLUCOSEU 250 (A) 01/13/2024 1623   HGBUR MODERATE (A) 01/13/2024 1623   BILIRUBINUR MODERATE (A) 01/13/2024 1623   BILIRUBINUR Negative 02/10/2023 1454   KETONESUR NEGATIVE 01/13/2024 1623   PROTEINUR 100 (A) 01/13/2024 1623   UROBILINOGEN 0.2 04/10/2015 2212   NITRITE POSITIVE (A) 01/13/2024 1623   LEUKOCYTESUR LARGE (A) 01/13/2024 1623    Radiological Exams on Admission: US  Renal Result Date: 03/10/2024 EXAM: US  Retroperitoneum Complete, Renal. CLINICAL HISTORY: acute kidney failure. TECHNIQUE: Real-time ultrasound of the retroperitoneum (complete) with image documentation. COMPARISON: CT dated 03/10/2024. FINDINGS: RIGHT KIDNEY: Right kidney measures 10.2 x 5.5 x 5.4 cm. Volume is 161 cc. Moderate hydroureteronephrosis, as on CT. No renal stone or mass visualized. LEFT KIDNEY: Left kidney measures 10.6 x 5.9 x 4.6 cm. Volume is 154 cc. Moderate hydroureteronephrosis, as on  CT. No renal stone or mass visualized. BLADDER: Mild bladder wall thickening. Prevoid Bladder Volume of 42 cc. IMPRESSION: 1. Moderate bilateral hydroureteronephrosis. 2. Mild bladder wall thickening, likely due to bladder outlet obstruction. Electronically signed by: Rockey Kilts MD 03/10/2024 05:17 PM EDT RP Workstation: [id number]   CT Renal Stone Study Result Date: 03/10/2024 EXAM: CT UROGRAM 03/10/2024 03:58:52 PM TECHNIQUE: CT of the abdomen and pelvis was performed without the administration of intravenous contrast. Multiplanar reformatted images as well as MIP urogram images are  provided for review. Automated exposure control, iterative reconstruction, and/or weight based adjustment of the mA/kV was utilized to reduce the radiation dose to as low as reasonably achievable. COMPARISON: 01/07/2024 CLINICAL HISTORY: acute kidney failure. Pt brb EMS [facility] for Abnormal Labs. All V/S WDL. No other complaints at this time. FINDINGS: LOWER CHEST: Small bilateral pleural effusions, right greater than left. Minimal dependent lower lobe atelectasis. LIVER: The liver is unremarkable. GALLBLADDER AND BILE DUCTS: Gallbladder is unremarkable. No biliary ductal dilatation. SPLEEN: No acute abnormality. PANCREAS: No acute abnormality. ADRENAL GLANDS: No acute abnormality. KIDNEYS, URETERS AND BLADDER: There is continued bilateral hydroureteronephrosis unchanged since prior exam. Stable bilateral mucosal thickening throughout the renal pelvis and bilateral ureters. Persistent bladder wall thickening with multiple bladder diverticula, consistent with sequela of bladder outlet obstruction. Perivesicular fat stranding could reflect superimposed cystitis. No stones in the kidneys or ureters. No perinephric or periureteral stranding. GI AND BOWEL: Stomach demonstrates no acute abnormality. Sigmoid diverticulosis without evidence of acute diverticulitis. Appendix is surgically absent. There is no bowel obstruction. PERITONEUM AND RETROPERITONEUM: No ascites. No free air. VASCULATURE: Aorta is normal in caliber. Aortic atherosclerosis. LYMPH NODES: No lymphadenopathy. REPRODUCTIVE ORGANS: No acute abnormality. BONES AND SOFT TISSUES: No acute osseous abnormality. No focal soft tissue abnormality. IMPRESSION: 1. Findings compatible with bladder outlet obstruction, with persistent bladder wall thickening and stable bilateral hydroureteronephrosis. Perivesicular Fat stranding and uroepithelial mural thickening consistent with cystitis and urinary tract infection. Please correlate with urinalysis. 2. No  urinary tract calculi. 3. Sigmoid diverticulosis without diverticulitis. Electronically signed by: Ozell Daring MD 03/10/2024 04:05 PM EDT RP Workstation: [id number]   EKG: Independently reviewed.  Sinus rhythm, rate 85, QTc 419.  No significant ST-T wave changes from prior.  Assessment/Plan Principal Problem:   Acute-on-chronic kidney injury Active Problems:   Urinary tract infection   Hyperkalemia   Essential hypertension   Rheumatoid arthritis (HCC)   Chronic pain syndrome   Assessment and Plan:  Acute on chronic kidney injury/bladder outlet obstruction-creatinine 6.28, baseline 1.1-1.3.  Baseline CKD 3A/3B. Last checked about a month ago and it was elevated at 1.8.  CT renal stone study-suggests bladder outlet obstruction, which is the likely etiology of her AKI, diuretics torsemide  and poor oral intake likely contributing. CT also show persistent bladder wall thickening and stable bilateral hydroureteronephrosis, other findings also consistent with UTI/cystitis.  No urinary tract calculi. - Insert Foley catheter - 1 L bolus given, continue N/s 100cc/hr x 20hrs - UA, urine cultures pending   Hyperkalemia/hyponatremia- K-  6.2, sodium 127.  In the setting of AKI, chronic poor oral intake. - hydrate - Lokelma  10 g x 1 given - D50, NovoLog 5 units  Anion gap metabolic acidosis-anion gap of 17, serum bicarb of 15 in the setting of AKI.  Likely urinary tract infection-CT suggest cystitis and UTI. Last urine culture 01/04/2024 grew multidrug-resistant Klebsiella - Pending UA result, will need meropenem  for UTI - Hold methenamine   Hypertension -Blood pressure soft - Hold torsemide   20 mg daily, metoprolol  12.5 twice daily, Norvasc  2.5  Rheumatoid arthritis- stable, on Arava  and Humira - Hold in the setting of acute infection   DVT prophylaxis: Heparin  Code Status: FULL Family Communication: None at bedside Disposition Plan: ~/> 2 days, back to nursing home Consults called:  None  Admission status: Inpt tele  I certify that at the point of admission it is my clinical judgment that the patient will require inpatient hospital care spanning beyond 2 midnights from the point of admission due to high intensity of service, high risk for further deterioration and high frequency of surveillance required.   Author: Tully FORBES Carwin, MD 03/10/2024 7:13 PM  For on call review [email or web address].

## 2024-03-11 DIAGNOSIS — N183 Chronic kidney disease, stage 3 unspecified: Secondary | ICD-10-CM

## 2024-03-11 DIAGNOSIS — M069 Rheumatoid arthritis, unspecified: Secondary | ICD-10-CM | POA: Diagnosis not present

## 2024-03-11 DIAGNOSIS — N170 Acute kidney failure with tubular necrosis: Secondary | ICD-10-CM

## 2024-03-11 DIAGNOSIS — E875 Hyperkalemia: Secondary | ICD-10-CM | POA: Diagnosis not present

## 2024-03-11 DIAGNOSIS — I10 Essential (primary) hypertension: Secondary | ICD-10-CM | POA: Diagnosis not present

## 2024-03-11 LAB — BASIC METABOLIC PANEL WITH GFR
Anion gap: 17 — ABNORMAL HIGH (ref 5–15)
BUN: 78 mg/dL — ABNORMAL HIGH (ref 8–23)
CO2: 12 mmol/L — ABNORMAL LOW (ref 22–32)
Calcium: 7.1 mg/dL — ABNORMAL LOW (ref 8.9–10.3)
Chloride: 103 mmol/L (ref 98–111)
Creatinine, Ser: 6.14 mg/dL — ABNORMAL HIGH (ref 0.44–1.00)
GFR, Estimated: 7 mL/min — ABNORMAL LOW (ref >60)
Glucose, Bld: 100 mg/dL — ABNORMAL HIGH (ref 70–99)
Potassium: 5.3 mmol/L — ABNORMAL HIGH (ref 3.5–5.1)
Sodium: 131 mmol/L — ABNORMAL LOW (ref 135–145)

## 2024-03-11 LAB — OCCULT BLOOD X 1 CARD TO LAB, STOOL: Fecal Occult Bld: NEGATIVE

## 2024-03-11 LAB — CBC
HCT: 20 % — ABNORMAL LOW (ref 36.0–46.0)
Hemoglobin: 5.9 g/dL — CL (ref 12.0–15.0)
MCH: 29.5 pg (ref 26.0–34.0)
MCHC: 29.5 g/dL — ABNORMAL LOW (ref 30.0–36.0)
MCV: 100 fL (ref 80.0–100.0)
Platelets: 411 K/uL — ABNORMAL HIGH (ref 150–400)
RBC: 2 MIL/uL — ABNORMAL LOW (ref 3.87–5.11)
RDW: 14.9 % (ref 11.5–15.5)
WBC: 8.8 K/uL (ref 4.0–10.5)
nRBC: 0 % (ref 0.0–0.2)

## 2024-03-11 LAB — OSMOLALITY, URINE: Osmolality, Ur: 290 mosm/kg — ABNORMAL LOW (ref 300–900)

## 2024-03-11 LAB — PREPARE RBC (CROSSMATCH)

## 2024-03-11 MED ORDER — HEPARIN SODIUM (PORCINE) 5000 UNIT/ML IJ SOLN: 5000.0000 [IU] | Freq: Three times a day (TID) | INTRAMUSCULAR | Status: DC

## 2024-03-11 MED ORDER — FUROSEMIDE 10 MG/ML IJ SOLN
20.0000 mg | Freq: Once | INTRAMUSCULAR | Status: AC
  Administered 2024-03-11: 20 mg via INTRAVENOUS
  Filled 2024-03-11: qty 2

## 2024-03-11 MED ORDER — TICAGRELOR 60 MG PO TABS
60.0000 mg | ORAL_TABLET | Freq: Every day | ORAL | Status: DC
  Administered 2024-03-13 – 2024-03-15 (×3): 60 mg via ORAL
  Filled 2024-03-11 (×4): qty 1

## 2024-03-11 MED ORDER — SODIUM CHLORIDE 0.9% IV SOLUTION: Freq: Once | INTRAVENOUS | Status: DC

## 2024-03-11 MED ORDER — SODIUM BICARBONATE 8.4 % IV SOLN
INTRAVENOUS | Status: DC
  Filled 2024-03-11 (×5): qty 1000

## 2024-03-11 NOTE — Progress Notes (Signed)
 PROGRESS NOTE  Robin Blackburn, is a 78 y.o. female, DOB - [birth year], FMW:[id number]  Admit date - 03/10/2024   Admitting Physician Robin Blackburn Carwin, MD  Outpatient Primary MD for the patient is Robin Satterfield, MD  LOS - 1  Chief Complaint  Patient presents with   Abnormal Labs      Brief Narrative:  78 y.o. female with medical history significant for rheumatoid arthritis, stroke, fibromyalgia, hypertension, chronic pain admitted on 03/10/2024 with AKI on CKD 3B as well as acute on chronic symptomatic anemia    -Assessment and Plan: AKI----acute kidney injury on CKD stage - 3b--probably related to obstructive uropathy see #4 below -  creatinine on admission=6.28   , baseline creatinine =  1.84 (02/01/24)  , - creatinine is now= 6.14  ,  -Nephrology consult appreciated -Continue bicarb drip -renally adjust medications, avoid nephrotoxic agents / dehydration  / hypotension  2) acute on chronic symptomatic anemia--hemoglobin down to 5.9 from 7.9 on admission - Recent baseline hemoglobin usually between 9 and 10 - Transfused 2 units of PRBCs on 03/11/2024 - No obvious bleeding---patient is on Brilinta  -Hold heparin  for DVT prophylaxis for now- use SCDs instead - Ferritin was 214, B12 was 617, folate was 19.2, serum iron was 39, iron saturation was 15 TIBC was 261 on 02/01/2024 - Check stool occult blood - Defer decision of possible Procrit/Epogen to nephrology service  3) hyperkalemia and anion gap metabolic acidosis--in the setting of #1 above - Lokelma  and bicarb as ordered  4) UTI with possible obstructive uropathy - CT renal stone protocol shows--Findings compatible with bladder outlet obstruction, with persistent bladder wall thickening and stable bilateral hydroureteronephrosis. Perivesicular Fat stranding and uroepithelial mural thickening consistent with cystitis and urinary tract infection. Please correlate with urinalysis. - Curbside conversation with Dr. Sherrilee from  urology service-recommends Foley catheter insertion - Foley placed 03/31/2024 -urine culture 01/04/2024 grew multidrug-resistant Klebsiella - Continue meropenem  pending culture data  5)Hypertension -Blood pressure soft - Hold torsemide  20 mg daily, metoprolol  12.5 twice daily, Norvasc  2.5   6)Rheumatoid arthritis- stable, on Arava  and Humira - Hold in the setting of acute infection  7)Social/Ethics--- as per patient her friend Robin Blackburn is a primary contact - Patient is a full code  Status is: Inpatient   Disposition: The patient is from: SNF--[facility]              Anticipated d/c is to: SNF              Anticipated d/c date is: > 3 days              Patient currently is not medically stable to d/c. Barriers: Not Clinically Stable-   Code Status :  -  Code Status: Full Code   Family Communication:    NA (patient is alert, awake and coherent)   as per patient her friend Robin Blackburn is a primary contact  DVT Prophylaxis  :   - SCDs   heparin  injection 5,000 Units Start: 03/12/24 1400   Lab Results  Component Value Date   PLT 411 (H) 03/11/2024    Inpatient Medications  Scheduled Meds:  sodium chloride    Intravenous Once   ALPRAZolam   0.5 mg Oral BID   Chlorhexidine  Gluconate Cloth  6 each Topical Daily   gabapentin   300 mg Oral TID   [START ON 03/12/2024] heparin   5,000 Units Subcutaneous Q8H   sodium zirconium cyclosilicate   10 g Oral Daily   [START ON 03/13/2024] ticagrelor   60 mg Oral Daily   traZODone   100 mg Oral QHS   Continuous Infusions:  meropenem  (MERREM ) IV     sodium bicarbonate 150 mEq in dextrose  5 % 1,150 mL infusion 100 mL/hr at 03/11/24 1218   PRN Meds:.acetaminophen  **OR** acetaminophen , ondansetron  **OR** ondansetron  (ZOFRAN ) IV, polyethylene glycol   Anti-infectives (From admission, onward)    Start     Dose/Rate Route Frequency Ordered Stop   03/11/24 2200  meropenem  (MERREM ) 500 mg in sodium chloride  0.9 % 100 mL  IVPB        500 mg 200 mL/hr over 30 Minutes Intravenous Every 24 hours 03/10/24 2318     03/10/24 2330  meropenem  (MERREM ) 1 g in sodium chloride  0.9 % 100 mL IVPB        1 g 200 mL/hr over 30 Minutes Intravenous  Once 03/10/24 2317 03/10/24 2356       Subjective: Robin Blackburn today has no fevers, no emesis,  No chest pain,   - Her friend Robin. Robin Blackburn,Robin Blackburn is at bedside-questions answered Foley with improved urine output  Objective: Vitals:   03/11/24 1650 03/11/24 1721 03/11/24 1730 03/11/24 1900  BP: 93/73 136/80 136/80 (!) 154/75  Pulse: 98   90  Resp: 18 18 18 17   Temp: 99.4 F (37.4 C) 99 F (37.2 C) 99 F (37.2 C) 100 F (37.8 C)  TempSrc: Oral Oral Oral Oral  SpO2: 96%   93%  Weight:      Height:        Intake/Output Summary (Last 24 hours) at 03/11/2024 2016 Last data filed at 03/11/2024 1824 Gross per 24 hour  Intake 1860 ml  Output 70 ml  Net 1790 ml   Filed Weights   03/10/24 1345  Weight: 72 kg    Physical Exam  Gen:- Awake Alert, chronically ill-appearing, no acute distress HEENT:- [place].AT, No sclera icterus Neck-Supple Neck,No JVD,.  Lungs-  CTAB , fair symmetrical air movement CV- S1, S2 normal, regular  Abd-  +ve B.Sounds, Abd Soft, No tenderness, No CVA area tenderness  Extremity/Skin:- No  edema, pedal pulses present  Psych-affect is appropriate, oriented x3 Neuro-no new focal deficits, no tremors GU-Foley in situ MSK-right arm PICC line in situ  Data Reviewed: I have personally reviewed following labs and imaging studies  CBC: Recent Labs  Lab 03/10/24 1429 03/11/24 0507  WBC 13.1* 8.8  NEUTROABS 10.3*  --   HGB 7.9* 5.9*  HCT 27.0* 20.0*  MCV 104.7* 100.0  PLT 486* 411*   Basic Metabolic Panel: Recent Labs  Lab 03/10/24 1429 03/11/24 0507  NA 127* 131*  K 6.2* 5.3*  CL 97* 103  CO2 13* 12*  GLUCOSE 128* 100*  BUN 82* 78*  CREATININE 6.28* 6.14*  CALCIUM  7.9* 7.1*   GFR: Estimated Creatinine Clearance: 7.7  mL/min (A) (by C-G formula based on SCr of 6.14 mg/dL (H)). Liver Function Tests: Recent Labs  Lab 03/10/24 1429  AST 14*  ALT <5  ALKPHOS 210*  BILITOT 0.4  PROT 7.4  ALBUMIN 3.0*   Cardiac Enzymes: Recent Labs  Lab 03/10/24 1429  CKTOTAL 69   Recent Results (from the past 240 hours)  Resp panel by RT-PCR (RSV, Flu A&B, Covid) Anterior Nasal Swab     Status: None   Collection Time: 03/10/24  2:48 PM   Specimen: Anterior Nasal Swab  Result Value Ref Range Status   SARS Coronavirus 2 by RT PCR NEGATIVE NEGATIVE Final    Comment: (NOTE) SARS-CoV-2 target  nucleic acids are NOT DETECTED.  The SARS-CoV-2 RNA is generally detectable in upper respiratory specimens during the acute phase of infection. The lowest concentration of SARS-CoV-2 viral copies this assay can detect is 138 copies/mL. A negative result does not preclude SARS-Cov-2 infection and should not be used as the sole basis for treatment or other patient management decisions. A negative result may occur with  improper specimen collection/handling, submission of specimen other than nasopharyngeal swab, presence of viral mutation(s) within the areas targeted by this assay, and inadequate number of viral copies(<138 copies/mL). A negative result must be combined with clinical observations, patient history, and epidemiological information. The expected result is Negative.  Fact Sheet for Patients:  BloggerCourse.com  Fact Sheet for Healthcare Providers:  SeriousBroker.it  This test is no t yet approved or cleared by the [place]  FDA and  has been authorized for detection and/or diagnosis of SARS-CoV-2 by FDA under an Emergency Use Authorization (EUA). This EUA will remain  in effect (meaning this test can be used) for the duration of the COVID-19 declaration under Section 564(b)(1) of the Act, 21 U.S.C.section 360bbb-3(b)(1), unless the authorization is  terminated  or revoked sooner.       Influenza A by PCR NEGATIVE NEGATIVE Final   Influenza B by PCR NEGATIVE NEGATIVE Final    Comment: (NOTE) The Xpert Xpress SARS-CoV-2/FLU/RSV plus assay is intended as an aid in the diagnosis of influenza from Nasopharyngeal swab specimens and should not be used as a sole basis for treatment. Nasal washings and aspirates are unacceptable for Xpert Xpress SARS-CoV-2/FLU/RSV testing.  Fact Sheet for Patients: BloggerCourse.com  Fact Sheet for Healthcare Providers: SeriousBroker.it  This test is not yet approved or cleared by the [place]  FDA and has been authorized for detection and/or diagnosis of SARS-CoV-2 by FDA under an Emergency Use Authorization (EUA). This EUA will remain in effect (meaning this test can be used) for the duration of the COVID-19 declaration under Section 564(b)(1) of the Act, 21 U.S.C. section 360bbb-3(b)(1), unless the authorization is terminated or revoked.     Resp Syncytial Virus by PCR NEGATIVE NEGATIVE Final    Comment: (NOTE) Fact Sheet for Patients: BloggerCourse.com  Fact Sheet for Healthcare Providers: SeriousBroker.it  This test is not yet approved or cleared by the [place]  FDA and has been authorized for detection and/or diagnosis of SARS-CoV-2 by FDA under an Emergency Use Authorization (EUA). This EUA will remain in effect (meaning this test can be used) for the duration of the COVID-19 declaration under Section 564(b)(1) of the Act, 21 U.S.C. section 360bbb-3(b)(1), unless the authorization is terminated or revoked.  Performed at [facility], [address], [place], [place] [address]   MRSA Next Gen by PCR, Nasal     Status: Abnormal   Collection Time: 03/10/24  8:57 PM   Specimen: Nasal Mucosa; Nasal Swab  Result Value Ref Range Status   MRSA by PCR Next Gen DETECTED (A) NOT  DETECTED Final    Comment: RESULT CALLED TO, READ BACK BY AND VERIFIED WITH: M MARSH,RN@2353  03/10/24 MK (NOTE) The GeneXpert MRSA Assay (FDA approved for NASAL specimens only), is one component of a comprehensive MRSA colonization surveillance program. It is not intended to diagnose MRSA infection nor to guide or monitor treatment for MRSA infections. Test performance is not FDA approved in patients less than 80 years old. Performed at [facility], [address], [place], [place] [address]      Radiology Studies: US  Renal Result Date: 03/10/2024  EXAM: US  Retroperitoneum Complete, Renal. CLINICAL HISTORY: acute kidney failure. TECHNIQUE: Real-time ultrasound of the retroperitoneum (complete) with image documentation. COMPARISON: CT dated 03/10/2024. FINDINGS: RIGHT KIDNEY: Right kidney measures 10.2 x 5.5 x 5.4 cm. Volume is 161 cc. Moderate hydroureteronephrosis, as on CT. No renal stone or mass visualized. LEFT KIDNEY: Left kidney measures 10.6 x 5.9 x 4.6 cm. Volume is 154 cc. Moderate hydroureteronephrosis, as on CT. No renal stone or mass visualized. BLADDER: Mild bladder wall thickening. Prevoid Bladder Volume of 42 cc. IMPRESSION: 1. Moderate bilateral hydroureteronephrosis. 2. Mild bladder wall thickening, likely due to bladder outlet obstruction. Electronically signed by: Rockey Kilts MD 03/10/2024 05:17 PM EDT RP Workstation: [id number]   CT Renal Stone Study Result Date: 03/10/2024 EXAM: CT UROGRAM 03/10/2024 03:58:52 PM TECHNIQUE: CT of the abdomen and pelvis was performed without the administration of intravenous contrast. Multiplanar reformatted images as well as MIP urogram images are provided for review. Automated exposure control, iterative reconstruction, and/or weight based adjustment of the mA/kV was utilized to reduce the radiation dose to as low as reasonably achievable. COMPARISON: 01/07/2024 CLINICAL HISTORY: acute kidney failure. [facility] brb [facility] for Abnormal  Labs. All V/S WDL. No other complaints at this time. FINDINGS: LOWER CHEST: Small bilateral pleural effusions, right greater than left. Minimal dependent lower lobe atelectasis. LIVER: The liver is unremarkable. GALLBLADDER AND BILE DUCTS: Gallbladder is unremarkable. No biliary ductal dilatation. SPLEEN: No acute abnormality. PANCREAS: No acute abnormality. ADRENAL GLANDS: No acute abnormality. KIDNEYS, URETERS AND BLADDER: There is continued bilateral hydroureteronephrosis unchanged since prior exam. Stable bilateral mucosal thickening throughout the renal pelvis and bilateral ureters. Persistent bladder wall thickening with multiple bladder diverticula, consistent with sequela of bladder outlet obstruction. Perivesicular fat stranding could reflect superimposed cystitis. No stones in the kidneys or ureters. No perinephric or periureteral stranding. GI AND BOWEL: Stomach demonstrates no acute abnormality. Sigmoid diverticulosis without evidence of acute diverticulitis. Appendix is surgically absent. There is no bowel obstruction. PERITONEUM AND RETROPERITONEUM: No ascites. No free air. VASCULATURE: Aorta is normal in caliber. Aortic atherosclerosis. LYMPH NODES: No lymphadenopathy. REPRODUCTIVE ORGANS: No acute abnormality. BONES AND SOFT TISSUES: No acute osseous abnormality. No focal soft tissue abnormality. IMPRESSION: 1. Findings compatible with bladder outlet obstruction, with persistent bladder wall thickening and stable bilateral hydroureteronephrosis. Perivesicular Fat stranding and uroepithelial mural thickening consistent with cystitis and urinary tract infection. Please correlate with urinalysis. 2. No urinary tract calculi. 3. Sigmoid diverticulosis without diverticulitis. Electronically signed by: Ozell Daring MD 03/10/2024 04:05 PM EDT RP Workstation: [id number]   Scheduled Meds:  sodium chloride    Intravenous Once   ALPRAZolam   0.5 mg Oral BID   Chlorhexidine  Gluconate Cloth  6 each Topical  Daily   gabapentin   300 mg Oral TID   [START ON 03/12/2024] heparin   5,000 Units Subcutaneous Q8H   sodium zirconium cyclosilicate   10 g Oral Daily   [START ON 03/13/2024] ticagrelor   60 mg Oral Daily   traZODone   100 mg Oral QHS   Continuous Infusions:  meropenem  (MERREM ) IV     sodium bicarbonate 150 mEq in dextrose  5 % 1,150 mL infusion 100 mL/hr at 03/11/24 1218     LOS: 1 day    Rendall Robin Blackburn M.D on 03/11/2024 at 8:16 PM  Go to [email or web address] - for contact info  [facility] - Office  [phone]  If 7PM-7AM, please contact night-coverage [email or web address] 03/11/2024, 8:16 PM

## 2024-03-11 NOTE — Consult Note (Signed)
 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  Have to wonder if she is in ATN with another possible urinary tract infection.  Her scans show trabeculation and changes in her bladder which are consistent with a history of frequent urinary tract infections. Creatinine was noted to be 6.28 and during her last hospitalization her creatinine ranged from 1.3-1.96 but was 1.36 on 01/13/2024.  Creatinine on 02/01/2024 was 1.84. -Will check urine lytes and also bladder scan to ensure that the Foley is functioning correctly.  Very minimal urine output since the Foley was placed and the bed sheets and covers are soaked in urine.  I notified the nurse and she probably changed all that out.  - I also discussed the need for possible dialysis at least temporarily as the patient certainly is not a good candidate for long-term dialysis.  She adamantly stated that she did not want long-term or even short-term dialysis.  Her POA was bedside and was part of this discussion as well but the patient currently is able to make her own decisions. -Will also change over to bicarbonate based isotonic fluids; hopefully she has some component of prerenal azotemia and will still respond to fluids.  -Monitor Daily I/Os, Daily weight  -Maintain MAP>65 for optimal renal perfusion.  - Avoid nephrotoxic agents such as IV contrast, NSAIDs, and phosphate containing bowel preps (FLEETS)  UTI - h/o MDR Klebsiella from prior hospitalization, urine culture sent. Hypertension -actually on the lower side and her blood pressure medicines as well as diuretic are being held appropriately. Rheumatoid arthritis Bladder outlet obstruction status post Foley catheter and will confirm that placement  is correct.  There is some resistance on gentle retraction which suggests it is in the right place.  May not be the right size as there appears to be significant leakage.   HPI: Robin Blackburn is an 78 y.o. female with a history of a prior CVA, GERD, fibromyalgia, hypertension, neuropathy, depression, rheumatoid arthritis and a recurrent urinary tract infections presenting from [facility] facility for abnormal labs.  Was hospitalized in late July 2 early August with a mental status secondary to a urinary tract infection Klebsiella pneumonia ESBL initially treated with Cipro  and then transition to meropenem .  Patient is followed by urology CT study done during hospitalization showing mild b/l hydronephrosis with recommendation for a 14-day course of Ertapenem  completed 01/18/24.  According to the patient she left the hospital with a Foley catheter which was subsequently removed but I do not see any documentation for that.  She states that she has not been feeling well with general malaise, poor appetite but did not have any nausea at the facility or diarrhea.  She has not been eating well because the food has not been good.  She denies any fever, chills but has had some pain in the suprapubic area for few days as well as sometimes difficulty getting her urinary stream started.  She denies any myalgias or shortness of breath or orthopnea.  In the ED repeat CT scan stone protocol showed evidence of bladder outlet obstruction with persistent bladder thickening and stable bilateral hydroureteronephrosis compared with the prior CT.  Foley catheter was placed.  Creatinine was noted to be 6.28 and during her last hospitalization her creatinine ranged from  1.3-1.96.  Creatinine on 02/01/2024 was 1.84.  ROS Pertinent items are noted in HPI.  Chemistry and CBC: Creatinine, Ser  Date/Time Value Ref Range Status  03/11/2024 05:07 AM 6.14 (H) 0.44 - 1.00 mg/dL Final  89/97/7974 97:70 PM 6.28 (H) 0.44 - 1.00 mg/dL  Final  91/74/7974 97:44 PM 1.84 (H) 0.44 - 1.00 mg/dL Final  91/93/7974 94:71 PM 1.36 (H) 0.44 - 1.00 mg/dL Final  91/97/7974 95:81 AM 1.18 (H) 0.44 - 1.00 mg/dL Final  91/98/7974 95:67 AM 1.29 (H) 0.44 - 1.00 mg/dL Final  92/68/7974 95:72 AM 1.31 (H) 0.44 - 1.00 mg/dL Final  92/69/7974 91:89 AM 1.44 (H) 0.44 - 1.00 mg/dL Final  92/70/7974 95:65 AM 1.73 (H) 0.44 - 1.00 mg/dL Final  92/71/7974 92:64 PM 1.96 (H) 0.44 - 1.00 mg/dL Final  92/83/7974 87:78 PM 1.46 (H) 0.44 - 1.00 mg/dL Final  94/70/7974 96:47 PM 1.35 (H) 0.44 - 1.00 mg/dL Final  96/83/7979 94:80 AM 0.78 0.44 - 1.00 mg/dL Final  96/84/7979 93:72 AM 0.70 0.44 - 1.00 mg/dL Final  96/85/7979 93:78 AM 0.73 0.44 - 1.00 mg/dL Final  96/86/7979 97:99 PM 0.82 0.44 - 1.00 mg/dL Final  97/78/7979 87:99 PM 0.89 0.44 - 1.00 mg/dL Final  93/71/7981 98:48 AM 0.61 0.44 - 1.00 mg/dL Final  93/77/7981 94:75 AM 0.65 0.44 - 1.00 mg/dL Final  93/78/7981 94:88 AM 0.57 0.44 - 1.00 mg/dL Final  93/79/7981 95:49 AM 0.73 0.44 - 1.00 mg/dL Final  93/80/7981 97:94 PM 0.82 0.44 - 1.00 mg/dL Final  89/72/7982 89:49 AM 0.59 0.44 - 1.00 mg/dL Final  89/73/7982 94:41 AM 0.62 0.44 - 1.00 mg/dL Final  89/74/7982 93:61 AM 0.64 0.44 - 1.00 mg/dL Final  89/74/7982 87:85 AM 0.62 0.44 - 1.00 mg/dL Final  92/79/7982 92:79 AM 0.70 0.44 - 1.00 mg/dL Final  92/80/7982 92:79 AM 0.67 0.44 - 1.00 mg/dL Final  92/89/7982 87:69 PM 0.72 0.44 - 1.00 mg/dL Final  93/82/7982 95:58 AM 0.77 0.44 - 1.00 mg/dL Final  93/84/7982 91:94 AM 0.79 0.44 - 1.00 mg/dL Final  93/92/7982 93:69 AM 0.83 0.44 - 1.00 mg/dL Final  94/68/7982 93:69 AM 0.65 0.44 - 1.00 mg/dL Final  94/94/7982 91:84 AM 0.81 0.44 - 1.00 mg/dL Final  96/86/7982 93:75 AM 0.70 0.44 - 1.00 mg/dL Final  96/91/7982 90:99 AM 0.69 0.44 - 1.00 mg/dL Final  97/91/7982 92:99 AM 0.75 0.44 - 1.00 mg/dL Final  87/73/7983 93:99 AM 0.73 0.44 - 1.00 mg/dL Final  87/84/7983 91:99 AM 0.74 0.44 - 1.00 mg/dL Final  88/98/7983  90:96 PM 0.85 0.44 - 1.00 mg/dL Final  89/68/7983 93:69 AM 0.72 0.44 - 1.00 mg/dL Final  89/71/7983 91:99 AM 0.72 0.44 - 1.00 mg/dL Final  89/85/7983 91:84 AM 0.82 0.44 - 1.00 mg/dL Final  89/87/7983 92:49 AM 0.78 0.44 - 1.00 mg/dL Final  89/93/7983 91:99 AM 0.75 0.44 - 1.00 mg/dL Final  90/91/7983 92:79 AM 0.72 0.44 - 1.00 mg/dL Final  91/88/7983 92:49 AM 0.68 0.44 - 1.00 mg/dL Final  92/71/7983 92:44 AM 0.73 0.44 - 1.00 mg/dL Final  92/86/7983 92:69 AM 0.71 0.44 - 1.00 mg/dL Final  93/72/7983 93:69 AM 0.71 0.44 - 1.00 mg/dL Final  94/68/7983 92:44 AM 0.77 0.44 - 1.00 mg/dL Final  94/97/7983 92:44 AM 0.78 0.44 - 1.00 mg/dL Final   Recent Labs  Lab 03/10/24 1429 03/11/24 0507  NA 127* 131*  K 6.2* 5.3*  CL 97* 103  CO2 13* 12*  GLUCOSE 128* 100*  BUN 82* 78*  CREATININE 6.28* 6.14*  CALCIUM  7.9* 7.1*   Recent Labs  Lab 03/10/24 1429 03/11/24 0507  WBC 13.1* 8.8  NEUTROABS 10.3*  --   HGB 7.9* 5.9*  HCT 27.0* 20.0*  MCV 104.7* 100.0  PLT 486* 411*   Liver Function Tests: Recent Labs  Lab 03/10/24 1429  AST 14*  ALT <5  ALKPHOS 210*  BILITOT 0.4  PROT 7.4  ALBUMIN 3.0*   No results for input(s): LIPASE, AMYLASE in the last 168 hours. No results for input(s): AMMONIA in the last 168 hours. Cardiac Enzymes: Recent Labs  Lab 03/10/24 1429  CKTOTAL 69   Iron Studies: No results for input(s): IRON, TIBC, TRANSFERRIN, FERRITIN in the last 72 hours. PT/INR: @LABRCNTIP (inr:5)  Xrays/Other Studies: ) Results for orders placed or performed during the hospital encounter of 03/10/24 (from the past 48 hours)  CBC with Differential     Status: Abnormal   Collection Time: 03/10/24  2:29 PM  Result Value Ref Range   WBC 13.1 (H) 4.0 - 10.5 K/uL   RBC 2.58 (L) 3.87 - 5.11 MIL/uL   Hemoglobin 7.9 (L) 12.0 - 15.0 g/dL   HCT 72.9 (L) 63.9 - 53.9 %   MCV 104.7 (H) 80.0 - 100.0 fL   MCH 30.6 26.0 - 34.0 pg   MCHC 29.3 (L) 30.0 - 36.0 g/dL   RDW 85.1  88.4 - 84.4 %   Platelets 486 (H) 150 - 400 K/uL   nRBC 0.0 0.0 - 0.2 %   Neutrophils Relative % 78 %   Neutro Abs 10.3 (H) 1.7 - 7.7 K/uL   Lymphocytes Relative 10 %   Lymphs Abs 1.4 0.7 - 4.0 K/uL   Monocytes Relative 8 %   Monocytes Absolute 1.0 0.1 - 1.0 K/uL   Eosinophils Relative 2 %   Eosinophils Absolute 0.2 0.0 - 0.5 K/uL   Basophils Relative 1 %   Basophils Absolute 0.1 0.0 - 0.1 K/uL   Immature Granulocytes 1 %   Abs Immature Granulocytes 0.16 (H) 0.00 - 0.07 K/uL    Comment: Performed at [facility], [name], [place], [place] [address]  Comprehensive metabolic panel     Status: Abnormal   Collection Time: 03/10/24  2:29 PM  Result Value Ref Range   Sodium 127 (L) 135 - 145 mmol/L   Potassium 6.2 (H) 3.5 - 5.1 mmol/L   Chloride 97 (L) 98 - 111 mmol/L   CO2 13 (L) 22 - 32 mmol/L   Glucose, Bld 128 (H) 70 - 99 mg/dL    Comment: Glucose reference range applies only to samples taken after fasting for at least 8 hours.   BUN 82 (H) 8 - 23 mg/dL   Creatinine, Ser 3.71 (H) 0.44 - 1.00 mg/dL   Calcium  7.9 (L) 8.9 - 10.3 mg/dL   Total Protein 7.4 6.5 - 8.1 g/dL   Albumin 3.0 (L) 3.5 - 5.0 g/dL   AST 14 (L) 15 - 41 U/L   ALT <5 0 - 44 U/L   Alkaline Phosphatase 210 (H) 38 - 126 U/L   Total Bilirubin 0.4 0.0 - 1.2 mg/dL   GFR, Estimated 6 (L) >60 mL/min    Comment: (NOTE) Calculated using the CKD-EPI Creatinine Equation (2021)    Anion gap 17 (H) 5 - 15    Comment: Performed at [facility], [address] [place], [place] [address]  Troponin T, High Sensitivity     Status: Abnormal   Collection Time: 03/10/24  2:29 PM  Result Value Ref  Range   Troponin T High Sensitivity 25 (H) 0 - 19 ng/L    Comment: (NOTE) Biotin concentrations > 1000 ng/mL falsely decrease TnT results.  Serial cardiac troponin measurements are suggested.  Refer to the Links section for chest pain algorithms and additional  guidance. Performed at [facility], [address],  [place], [place] [address]   CK     Status: None   Collection Time: 03/10/24  2:29 PM  Result Value Ref Range   Total CK 69 38 - 234 U/L    Comment: Performed at [facility], [address], [place], [place] [address]  Resp panel by RT-PCR (RSV, Flu A&B, Covid) Anterior Nasal Swab     Status: None   Collection Time: 03/10/24  2:48 PM   Specimen: Anterior Nasal Swab  Result Value Ref Range   SARS Coronavirus 2 by RT PCR NEGATIVE NEGATIVE    Comment: (NOTE) SARS-CoV-2 target nucleic acids are NOT DETECTED.  The SARS-CoV-2 RNA is generally detectable in upper respiratory specimens during the acute phase of infection. The lowest concentration of SARS-CoV-2 viral copies this assay can detect is 138 copies/mL. A negative result does not preclude SARS-Cov-2 infection and should not be used as the sole basis for treatment or other patient management decisions. A negative result may occur with  improper specimen collection/handling, submission of specimen other than nasopharyngeal swab, presence of viral mutation(s) within the areas targeted by this assay, and inadequate number of viral copies(<138 copies/mL). A negative result must be combined with clinical observations, patient history, and epidemiological information. The expected result is Negative.  Fact Sheet for Patients:  BloggerCourse.com  Fact Sheet for Healthcare Providers:  SeriousBroker.it  This test is no t yet approved or cleared by the [place]  FDA and  has been authorized for detection and/or diagnosis of SARS-CoV-2 by FDA under an Emergency Use Authorization (EUA). This EUA will remain  in effect (meaning this test can be used) for the duration of the COVID-19 declaration under Section 564(b)(1) of the Act, 21 U.S.C.section 360bbb-3(b)(1), unless the authorization is terminated  or revoked sooner.       Influenza A by PCR NEGATIVE NEGATIVE   Influenza B by PCR  NEGATIVE NEGATIVE    Comment: (NOTE) The Xpert Xpress SARS-CoV-2/FLU/RSV plus assay is intended as an aid in the diagnosis of influenza from Nasopharyngeal swab specimens and should not be used as a sole basis for treatment. Nasal washings and aspirates are unacceptable for Xpert Xpress SARS-CoV-2/FLU/RSV testing.  Fact Sheet for Patients: BloggerCourse.com  Fact Sheet for Healthcare Providers: SeriousBroker.it  This test is not yet approved or cleared by the [place]  FDA and has been authorized for detection and/or diagnosis of SARS-CoV-2 by FDA under an Emergency Use Authorization (EUA). This EUA will remain in effect (meaning this test can be used) for the duration of the COVID-19 declaration under Section 564(b)(1) of the Act, 21 U.S.C. section 360bbb-3(b)(1), unless the authorization is terminated or revoked.     Resp Syncytial Virus by PCR NEGATIVE NEGATIVE    Comment: (NOTE) Fact Sheet for Patients: BloggerCourse.com  Fact Sheet for Healthcare Providers: SeriousBroker.it  This test is not yet approved or cleared by the [place]  FDA and has been authorized for detection and/or diagnosis of SARS-CoV-2 by FDA under an Emergency Use Authorization (EUA). This EUA will remain in effect (meaning this test can be used) for the duration of the COVID-19 declaration under Section 564(b)(1) of the Act, 21 U.S.C. section 360bbb-3(b)(1), unless  the authorization is terminated or revoked.  Performed at [facility], [address], [place], [place] [address]   Osmolality     Status: Abnormal   Collection Time: 03/10/24  4:22 PM  Result Value Ref Range   Osmolality 305 (H) 275 - 295 mOsm/kg    Comment: Performed at [facility], [address], [place], [place] [address]  Troponin T, High Sensitivity     Status: Abnormal   Collection Time: 03/10/24  5:31 PM  Result  Value Ref Range   Troponin T High Sensitivity 24 (H) 0 - 19 ng/L    Comment: (NOTE) Biotin concentrations > 1000 ng/mL falsely decrease TnT results.  Serial cardiac troponin measurements are suggested.  Refer to the Links section for chest pain algorithms and additional  guidance. Performed at [facility], [address], [place], [place] [address]   Urinalysis, Routine w reflex microscopic -Urine, Clean Catch     Status: Abnormal   Collection Time: 03/10/24  8:30 PM  Result Value Ref Range   Color, Urine AMBER (A) YELLOW    Comment: BIOCHEMICALS MAY BE AFFECTED BY COLOR   APPearance TURBID (A) CLEAR   Specific Gravity, Urine 1.010 1.005 - 1.030   pH 5.0 5.0 - 8.0   Glucose, UA NEGATIVE NEGATIVE mg/dL   Hgb urine dipstick NEGATIVE NEGATIVE   Bilirubin Urine NEGATIVE NEGATIVE   Ketones, ur NEGATIVE NEGATIVE mg/dL   Protein, ur 899 (A) NEGATIVE mg/dL   Nitrite NEGATIVE NEGATIVE   Leukocytes,Ua LARGE (A) NEGATIVE   RBC / HPF 0-5 0 - 5 RBC/hpf   WBC, UA >50 0 - 5 WBC/hpf   Bacteria, UA RARE (A) NONE SEEN   Squamous Epithelial / HPF 6-10 0 - 5 /HPF   WBC Clumps PRESENT     Comment: Performed at [facility], [address], [place], [place] [address]  MRSA Next Gen by PCR, Nasal     Status: Abnormal   Collection Time: 03/10/24  8:57 PM   Specimen: Nasal Mucosa; Nasal Swab  Result Value Ref Range   MRSA by PCR Next Gen DETECTED (A) NOT DETECTED    Comment: RESULT CALLED TO, READ BACK BY AND VERIFIED WITH: M MARSH,RN@[id number]  03/10/24 MK (NOTE) The GeneXpert MRSA Assay (FDA approved for NASAL specimens only), is one component of a comprehensive MRSA colonization surveillance program. It is not intended to diagnose MRSA infection nor to guide or monitor treatment for MRSA infections. Test performance is not FDA approved in patients less than 59 years old. Performed at [facility], [address] [place], [place] [address]   Osmolality, urine     Status: Abnormal   Collection Time:  03/10/24 10:22 PM  Result Value Ref Range   Osmolality, Ur 290 (L) 300 - 900 mOsm/kg    Comment: Performed at [facility], [address], [place], [place] [address]  Creatinine, urine, random     Status: None   Collection Time: 03/10/24 10:22 PM  Result Value Ref Range   Creatinine, Urine 46 mg/dL    Comment: Performed at [facility], [address] [place], [place] [address]  Glucose, capillary     Status: Abnormal   Collection Time: 03/10/24 10:31 PM  Result Value Ref Range   Glucose-Capillary 117 (H) 70 - 99 mg/dL    Comment: Glucose reference range applies only to samples taken after fasting for at least 8 hours.  Basic metabolic panel     Status: Abnormal   Collection  Time: 03/11/24  5:07 AM  Result Value Ref Range   Sodium 131 (L) 135 - 145 mmol/L   Potassium 5.3 (H) 3.5 - 5.1 mmol/L   Chloride 103 98 - 111 mmol/L   CO2 12 (L) 22 - 32 mmol/L   Glucose, Bld 100 (H) 70 - 99 mg/dL    Comment: Glucose reference range applies only to samples taken after fasting for at least 8 hours.   BUN 78 (H) 8 - 23 mg/dL   Creatinine, Ser 3.85 (H) 0.44 - 1.00 mg/dL   Calcium  7.1 (L) 8.9 - 10.3 mg/dL   GFR, Estimated 7 (L) >60 mL/min    Comment: (NOTE) Calculated using the CKD-EPI Creatinine Equation (2021)    Anion gap 17 (H) 5 - 15    Comment: Performed at [facility], [address], [place], [place] [address]  CBC     Status: Abnormal   Collection Time: 03/11/24  5:07 AM  Result Value Ref Range   WBC 8.8 4.0 - 10.5 K/uL   RBC 2.00 (L) 3.87 - 5.11 MIL/uL   Hemoglobin 5.9 (LL) 12.0 - 15.0 g/dL    Comment: REPEATED TO VERIFY This critical result has been called to BOWERS by Rutherford Sauers on 03/11/2024 07:18:36, and has been read back.    HCT 20.0 (L) 36.0 - 46.0 %   MCV 100.0 80.0 - 100.0 fL   MCH 29.5 26.0 - 34.0 pg   MCHC 29.5 (L) 30.0 - 36.0 g/dL   RDW 85.0 88.4 - 84.4 %   Platelets 411 (H) 150 - 400 K/uL   nRBC 0.0 0.0 - 0.2 %    Comment: Performed at [facility], [address], [place], [place] [address]   US  Renal Result Date: 03/10/2024 EXAM: US  Retroperitoneum Complete, Renal. CLINICAL HISTORY: acute kidney failure. TECHNIQUE: Real-time ultrasound of the retroperitoneum (complete) with image documentation. COMPARISON: CT dated 03/10/2024. FINDINGS: RIGHT KIDNEY: Right kidney measures 10.2 x 5.5 x 5.4 cm. Volume is 161 cc. Moderate hydroureteronephrosis, as on CT. No renal stone or mass visualized. LEFT KIDNEY: Left kidney measures 10.6 x 5.9 x 4.6 cm. Volume is 154 cc. Moderate hydroureteronephrosis, as on CT. No renal stone or mass visualized. BLADDER: Mild bladder wall thickening. Prevoid Bladder Volume of 42 cc. IMPRESSION: 1. Moderate bilateral hydroureteronephrosis. 2. Mild bladder wall thickening, likely due to bladder outlet obstruction. Electronically signed by: Rockey Kilts MD 03/10/2024 05:17 PM EDT RP Workstation: [id number]   CT Renal Stone Study Result Date: 03/10/2024 EXAM: CT UROGRAM 03/10/2024 03:58:52 PM TECHNIQUE: CT of the abdomen and pelvis was performed without the administration of intravenous contrast. Multiplanar reformatted images as well as MIP urogram images are provided for review. Automated exposure control, iterative reconstruction, and/or weight based adjustment of the mA/kV was utilized to reduce the radiation dose to as low as reasonably achievable. COMPARISON: 01/07/2024 CLINICAL HISTORY: acute kidney failure. Pt brb EMS from [facility] for Abnormal Labs. All V/S WDL. No other complaints at this time. FINDINGS: LOWER CHEST: Small bilateral pleural effusions, right greater than left. Minimal dependent lower lobe atelectasis. LIVER: The liver is unremarkable. GALLBLADDER AND BILE DUCTS: Gallbladder is unremarkable. No biliary ductal dilatation. SPLEEN: No acute abnormality. PANCREAS: No acute abnormality. ADRENAL GLANDS: No acute abnormality. KIDNEYS, URETERS AND BLADDER: There is continued bilateral hydroureteronephrosis  unchanged since prior exam. Stable bilateral mucosal thickening throughout the renal pelvis and bilateral ureters. Persistent bladder wall thickening with multiple bladder diverticula, consistent with sequela of bladder outlet obstruction. Perivesicular fat  stranding could reflect superimposed cystitis. No stones in the kidneys or ureters. No perinephric or periureteral stranding. GI AND BOWEL: Stomach demonstrates no acute abnormality. Sigmoid diverticulosis without evidence of acute diverticulitis. Appendix is surgically absent. There is no bowel obstruction. PERITONEUM AND RETROPERITONEUM: No ascites. No free air. VASCULATURE: Aorta is normal in caliber. Aortic atherosclerosis. LYMPH NODES: No lymphadenopathy. REPRODUCTIVE ORGANS: No acute abnormality. BONES AND SOFT TISSUES: No acute osseous abnormality. No focal soft tissue abnormality. IMPRESSION: 1. Findings compatible with bladder outlet obstruction, with persistent bladder wall thickening and stable bilateral hydroureteronephrosis. Perivesicular Fat stranding and uroepithelial mural thickening consistent with cystitis and urinary tract infection. Please correlate with urinalysis. 2. No urinary tract calculi. 3. Sigmoid diverticulosis without diverticulitis. Electronically signed by: Ozell Daring MD 03/10/2024 04:05 PM EDT RP Workstation: [id number]    PMH:   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   Depression    Fibromyalgia    Hypertension    Neuropathy    Rheumatoid arthritis (HCC)    Stroke (HCC) 02/17/14    PSH:   Past Surgical History:  Procedure Laterality Date   ABDOMINAL HYSTERECTOMY     ANKLE RECONSTRUCTION     APPENDECTOMY     BACK SURGERY     CHOLECYSTECTOMY     KNEE SURGERY      Allergies:  Allergies  Allergen Reactions   Cortisone Anaphylaxis    Cardiovascular Arrest   Fentanyl  Anaphylaxis, Hives and Other (See Comments)     felt like I had demons in my head   Hydromorphone  Anaphylaxis and Nausea And Vomiting    GI Intolerance   Iodinated Contrast Media Anaphylaxis, Hives, Rash and Dermatitis    Respiratory Distress  Iodinated contrast media (substance)   Keflex  [Cephalexin ] Nausea And Vomiting   Meperidine Anaphylaxis and Shortness Of Breath    Respiratory Distress   Morphine Anaphylaxis, Nausea And Vomiting and Swelling    GI Intolerance   Oxycodone  Anaphylaxis   Oxycodone -Acetaminophen  Nausea And Vomiting, Rash, Swelling and Dermatitis    GI Intolerance, Mouth swelling.  acetaminophen  / oxycodone    Penicillins Anaphylaxis, Nausea And Vomiting and Other (See Comments)    Immediate rash, facial/tongue/throat swelling, SOB or lightheadedness with hypotension  Product containing penicillin (product)   Afluria Preservative Free [Influenza Virus Vacc Split Pf] Other (See Comments)    Unknown   Aspirin  Other (See Comments)    GI Intolerance   Ciprofloxacin  Hcl Other (See Comments)    Unknown    Codeine Other (See Comments)    Unknown    Influenza Vaccines Other (See Comments)    Unknown    Influenza Virus Vaccine Other (See Comments)    Unknown    Iohexol Other (See Comments)    Unknown   Meperidine Hcl Other (See Comments)    Unknown   Misc. Sulfonamide Containing Compounds Other (See Comments)    Unknown   Oxycodone  Hcl Other (See Comments)    Unknown   Penicillamine Other (See Comments)    Unknown    Shellfish Allergy Other (See Comments)    Glucosamine not an option Unknown    Sulfa  Antibiotics Nausea And Vomiting    Tolerates Bactrim  though   Troleandomycin Nausea And Vomiting and Swelling    GI Intolerance   Potassium Chloride  Other (See Comments)    No reaction listed on MAR   Bisphosphonates Hives and Other (See Comments)  GI intolerance   Ciprofloxacin  Nausea Only and Rash   Iodine Hives   Levaquin [Levofloxacin In D5w] Nausea And Vomiting and Rash   Levofloxacin Hives  and Other (See Comments)    Mental Status Changes, Confusion   Morphine And Codeine Rash   Oxytetracycline Nausea And Vomiting   Percocet [Oxycodone -Acetaminophen ] Swelling    Mouth swelling.   Sulfur Nausea And Vomiting, Rash and Dermatitis    GI Intolerance  sulfur    Medications:   Prior to Admission medications   Medication Sig Start Date End Date Taking? Authorizing Provider  Adalimumab (HUMIRA, 2 PEN,) 40 MG/0.8ML PNKT Inject 40 mg into the skin every 14 (fourteen) days.   Yes [provider]  ALPRAZolam  (XANAX ) 0.5 MG tablet Take 1 tablet (0.5 mg total) by mouth 2 (two) times daily as needed for anxiety. Patient taking differently: Take 0.5 mg by mouth 2 (two) times daily. 01/09/24  Yes [name], [name], MD  amLODipine  (NORVASC ) 2.5 MG tablet Take 2.5 mg by mouth daily. Hold for SBP<110 12/10/23  Yes [provider]  benzonatate (TESSALON) 200 MG capsule Take 200 mg by mouth every 8 (eight) hours as needed for cough.   Yes [provider]  chlorhexidine  (PERIDEX ) 0.12 % solution Use as directed 15 mLs in the mouth or throat 2 (two) times daily.   Yes [provider]  Cholecalciferol  5000 units TABS Take 1 tablet by mouth daily.    Yes [provider]  Cranberry 450 MG TABS Take 1 tablet by mouth in the morning and at bedtime. For UTI   Yes [provider]  cycloSPORINE  (RESTASIS ) 0.05 % ophthalmic emulsion Place 1 drop into both eyes 2 (two) times daily. Dry eyes   Yes [provider]  Dextromethorphan-guaiFENesin (MUCINEX DM MAXIMUM STRENGTH) 60-1200 MG TB12 Take 1 tablet by mouth every 12 (twelve) hours as needed (cough/congestion).   Yes [provider]  dicyclomine  (BENTYL ) 20 MG tablet Take 20 mg by mouth every 6 (six) hours. Abdominal cramps 01/31/14  Yes [provider]  estradiol  (ESTRACE ) 0.1 MG/GM vaginal cream Place 1 Applicatorful vaginally every other day. Discard plastic applicator. Insert a  blueberry size amount (approximately 1 gram) of cream on fingertip inside vagina at bedtime every other night for long term use. Patient taking differently: Place 1 Applicatorful vaginally See admin instructions. Every Tuesday and Friday. Discard plastic applicator. Insert a blueberry size amount (approximately 1 gram) of cream on fingertip inside vagina at bedtime every other night for long term use. 01/09/24  Yes [name], [name], MD  fluticasone (FLONASE) 50 MCG/ACT nasal spray Place 1 spray into both nostrils in the morning and at bedtime.   Yes [provider]  folic acid  (FOLVITE ) 1 MG tablet Take 1 mg by mouth daily.   Yes [provider]  gabapentin  (NEURONTIN ) 300 MG capsule Take 1 capsule (300 mg total) by mouth 3 (three) times daily as needed (neuropathy pain symptoms). Patient taking differently: Take 300 mg by mouth 3 (three) times daily. 01/09/24  Yes [name], [name], MD  guaiFENesin-dextromethorphan (ROBITUSSIN DM) 100-10 MG/5ML syrup Take 10 mLs by mouth at bedtime.   Yes [provider]  lansoprazole (PREVACID) 30 MG capsule Take 60 mg by mouth 2 (two) times daily before a meal. DO NOT CHANGE PER MD   Yes [provider]  leflunomide  (ARAVA ) 10 MG tablet Take 10 mg by mouth daily. 08/01/20  Yes [provider]  Melatonin 10 MG TABS Take  10 mg by mouth at bedtime.   Yes [provider]  methenamine  (HIPREX ) 1 g tablet Take 1 tablet (1 g total) by mouth 2 (two) times daily. Prophylactic 01/22/24  Yes [name], [name], MD  methocarbamol  (ROBAXIN ) 500 MG tablet Take 500 mg by mouth every 8 (eight) hours as needed for muscle spasms. 10/30/23  Yes [provider]  metoprolol  tartrate (LOPRESSOR ) 25 MG tablet Take 0.5 tablets (12.5 mg total) by mouth 2 (two) times daily. 01/09/24  Yes [name], [name], MD  Multiple Vitamins-Minerals (PRESERVISION AREDS 2) CAPS Take 1 capsule by mouth in the morning and at bedtime.   Yes  [provider]  Nutritional Supplement LIQD Take 120 mLs by mouth daily at 6 PM. House supplement on [facility]   Yes [provider]  ondansetron  (ZOFRAN ) 4 MG tablet Take 4 mg by mouth every 8 (eight) hours as needed for vomiting or nausea. 02/13/24  Yes [provider]  phenazopyridine  (PYRIDIUM ) 200 MG tablet Take 200 mg by mouth every 8 (eight) hours as needed (dysuria). 12/28/23  Yes [provider]  Probiotic, Lactobacillus, CAPS Take 1 capsule by mouth in the morning, at noon, and at bedtime.   Yes [provider]  senna (SENOKOT) 8.6 MG tablet Take 1 tablet by mouth 2 (two) times daily.   Yes [provider]  sodium chloride  0.9 % infusion Inject 10 mLs into the vein daily. Use 10 mL intravenously one time a day for flush upper right arm picc line 02/12/24  Yes [provider]  sodium zirconium cyclosilicate  (LOKELMA ) 10 g PACK packet Take 10 g by mouth 3 (three) times daily.   Yes [provider]  ticagrelor  (BRILINTA ) 60 MG TABS tablet Take 60 mg by mouth daily.   Yes [provider]  torsemide  (DEMADEX ) 20 MG tablet Take 20 mg by mouth daily. 08/31/23  Yes [provider]  traMADol  (ULTRAM ) 50 MG tablet Take 1 tablet (50 mg total) by mouth every 8 (eight) hours as needed for severe pain (pain score 7-10). 01/09/24  Yes [name], [name], MD  traZODone  (DESYREL ) 100 MG tablet Take 100 mg by mouth at bedtime. 02/25/23  Yes [provider]  vitamin B-12 (CYANOCOBALAMIN ) 250 MCG tablet Take 250 mcg by mouth daily.   Yes [provider]  ferrous sulfate  325 (65 FE) MG EC tablet Take 1 tablet (325 mg total) by mouth daily with breakfast. Patient not taking: Reported on 03/10/2024 02/04/24   Thayil, Irene T, PA-C  lidocaine  (LIDODERM ) 5 % Place 1 patch onto the skin every 12 (twelve) hours. Remove & Discard patch within 12 hours or as directed Patient not taking: Reported on 03/10/2024 12/24/21   Debby Fidela CROME, NP  potassium chloride  SA (KLOR-CON  M) 10 MEQ tablet Take 1 tablet (10 mEq total) by mouth daily. Patient not taking: Reported on 03/10/2024 01/10/24   [name], MD    Discontinued Meds:   Medications Discontinued During This Encounter  Medication Reason   acetaminophen  (TYLENOL ) 500 MG tablet Discontinued by provider   ascorbic acid (VITAMIN C) 500 MG tablet Discontinued by provider   diclofenac  Sodium (VOLTAREN ) 1 % GEL Discontinued by provider   guaiFENesin (MUCINEX) 600 MG 12 hr tablet Dose change   Lidocaine , Anorectal, (HEMORRHOIDAL RELIEF) 5 % CREA Discontinued by provider   loperamide  (IMODIUM  A-D) 2 MG tablet Discontinued by provider   meclizine  (ANTIVERT ) 12.5 MG tablet Discontinued by provider   simethicone  (MYLICON) 125 MG  chewable tablet Discontinued by provider   vitamin E 1000 UNIT capsule Discontinued by provider   insulin aspart (novoLOG) injection 5 Units     Social History:  reports that she has never smoked. She has never been exposed to tobacco smoke. She has never used smokeless tobacco. She reports that she does not drink alcohol  and does not use drugs.  Family History:   Family History  Problem Relation Age of Onset   Breast cancer Mother    Hypertension Father    Transient ischemic attack Father    Lung cancer Father    Hypertension Brother    Leukemia Brother    Leukemia Brother    Leukemia Brother    CVA Maternal Grandfather     Blood pressure (!) 108/54, pulse 95, temperature 98.1 F (36.7 C), temperature source Oral, resp. rate 18, height 5' 6 (1.676 m), weight 72 kg, SpO2 96%. General appearance: alert and cooperative Head: Normocephalic, without obvious abnormality, atraumatic Eyes: negative Back: negative Resp: clear to auscultation bilaterally Cardio: regularly irregular rhythm GI: Suprapubic tenderness, Foley in place Extremities: extremities normal, atraumatic, no cyanosis or edema Pulses: 2+ and symmetric Skin: Skin  color, texture, turgor normal. No rashes or lesions       Gayna Braddy, LYNWOOD ORN, MD 03/11/2024, 10:16 AM

## 2024-03-11 NOTE — TOC Initial Note (Signed)
 Transition of Care [facility]) - Initial/Assessment Note    Patient Details  Name: Robin Blackburn MRN: [id number] Date of Birth: 03/24/1946  [facility] CM/SW Contact:    Noreen KATHEE Pinal, LCSWA Phone Number: 03/11/2024, 8:58 AM  Clinical Narrative:                   Patient is at risk for readmission due to high admission score. Patient was admitted for Acute-on-chronic kidney injury . CSW spoke with patient POA who was at bedside, patient was sleeping.  Patient is a long-term resident at [facility]. Patient has been there for 5 years. Staff assist with patient ADL's and patient is primary WC bound. CSW will continue to follow.  Expected Discharge Plan: Skilled Nursing Facility Barriers to Discharge: Continued Medical Work up   Patient Goals and CMS Choice Patient states their goals for this hospitalization and ongoing recovery are:: return back to [facility] CMS Medicare.gov Compare Post Acute Care list provided to:: Other (Comment Required) (POA: Jafarrell)        Expected Discharge Plan and Services     Post Acute Care Choice: Skilled Nursing Facility Living arrangements for the past 2 months: Skilled Nursing Facility                                      Prior Living Arrangements/Services Living arrangements for the past 2 months: Skilled Nursing Facility Lives with:: Facility Resident Patient language and need for interpreter reviewed:: Yes        Need for Family Participation in Patient Care: Yes (Comment) Care giver support system in place?: Yes (comment) Current home services: DME Criminal Activity/Legal Involvement Pertinent to Current Situation/Hospitalization: No - Comment as needed  Activities of Daily Living      Permission Sought/Granted      Share Information with NAME: Jafarrell     Permission granted to share info w Relationship: POA     Emotional Assessment Appearance:: Appears stated age     Orientation: : Oriented to Self Alcohol  /  Substance Use: Not Applicable Psych Involvement: No (comment)  Admission diagnosis:  Acute-on-chronic kidney injury [N17.9, N18.9] Patient Active Problem List   Diagnosis Date Noted   Acute-on-chronic kidney injury 03/10/2024   Hyperkalemia 03/10/2024   Bladder outlet obstruction 03/10/2024   Pneumonia 02/18/2024   Urinary tract infection 01/04/2024   Pulmonary nodules 01/27/2023   Bilateral hearing loss 07/01/2022   Chronic ear pain, bilateral 07/01/2022   Spinal stenosis of lumbar region 11/22/2021   OAB (overactive bladder) 05/21/2020   Osteoarthritis of right knee 06/01/2019   Weakness    Recurrent UTI 11/25/2016   Benzodiazepine withdrawal with perceptual disturbance (HCC) 04/02/2016   Osteoporosis 11/01/2015   Vitamin D  deficiency 10/11/2015   History of fracture of left ankle 09/28/2015   Chronic pain in left foot 09/28/2015   Fibromyalgia syndrome 09/28/2015   GERD (gastroesophageal reflux disease) 04/05/2015   Diarrhea 01/05/2015   Chronic pain syndrome 12/02/2014   Back pain, lumbosacral 06/23/2014   Sinusitis, chronic 06/17/2014   H/O: CVA (cerebrovascular accident) 05/24/2014   HLD (hyperlipidemia) 05/24/2014   Physical deconditioning 05/24/2014   Right sided weakness 02/26/2014   Rheumatoid arthritis (HCC) 02/20/2014   Essential hypertension 02/19/2014   Neuropathy 02/19/2014   Anxiety 02/19/2014   PCP:  Bertell Satterfield, MD Pharmacy:   [facility] [place], [place] - [address] [place]  [address] [place] [address] Phone: [phone] Fax: [phone]     Social Drivers of Health (SDOH) Social History: SDOH Screenings   Food Insecurity: No Food Insecurity (03/11/2024)  Housing: Low Risk  (03/11/2024)  Transportation Needs: No Transportation Needs (03/11/2024)  Utilities: Not At Risk (03/11/2024)  Alcohol  Screen: Low Risk  (01/06/2022)  Depression (PHQ2-9): Low Risk  (02/25/2024)  Financial Resource Strain: Low Risk  (01/06/2022)   Physical Activity: Inactive (11/07/2022)   Received from [facility]  Social Connections: Socially Isolated (03/11/2024)  Stress: No Stress Concern Present (01/06/2022)  Tobacco Use: Low Risk  (03/10/2024)   SDOH Interventions:     Readmission Risk Interventions    03/11/2024    8:52 AM 01/09/2024   11:45 AM 01/08/2024    1:10 PM  Readmission Risk Prevention Plan  Transportation Screening Complete Complete Complete  Home Care Screening  Complete Complete  Medication Review (RN CM)  Complete Complete  Medication Review Oceanographer) Complete    HRI or Home Care Consult Complete    SW Recovery Care/Counseling Consult Complete    Palliative Care Screening Not Applicable    Skilled Nursing Facility Complete

## 2024-03-12 DIAGNOSIS — E875 Hyperkalemia: Secondary | ICD-10-CM | POA: Diagnosis not present

## 2024-03-12 DIAGNOSIS — N181 Chronic kidney disease, stage 1: Secondary | ICD-10-CM

## 2024-03-12 DIAGNOSIS — N170 Acute kidney failure with tubular necrosis: Secondary | ICD-10-CM | POA: Diagnosis not present

## 2024-03-12 DIAGNOSIS — N320 Bladder-neck obstruction: Secondary | ICD-10-CM | POA: Diagnosis not present

## 2024-03-12 DIAGNOSIS — I10 Essential (primary) hypertension: Secondary | ICD-10-CM | POA: Diagnosis not present

## 2024-03-12 LAB — TYPE AND SCREEN
ABO/RH(D): O POS
Antibody Screen: NEGATIVE
Unit division: 0
Unit division: 0

## 2024-03-12 LAB — BASIC METABOLIC PANEL WITH GFR
Anion gap: 16 — ABNORMAL HIGH (ref 5–15)
BUN: 71 mg/dL — ABNORMAL HIGH (ref 8–23)
CO2: 19 mmol/L — ABNORMAL LOW (ref 22–32)
Calcium: 7.7 mg/dL — ABNORMAL LOW (ref 8.9–10.3)
Chloride: 100 mmol/L (ref 98–111)
Creatinine, Ser: 5.6 mg/dL — ABNORMAL HIGH (ref 0.44–1.00)
GFR, Estimated: 7 mL/min — ABNORMAL LOW (ref >60)
Glucose, Bld: 139 mg/dL — ABNORMAL HIGH (ref 70–99)
Potassium: 4.5 mmol/L (ref 3.5–5.1)
Sodium: 135 mmol/L (ref 135–145)

## 2024-03-12 LAB — BPAM RBC
Blood Product Expiration Date: 202510232359
Blood Product Expiration Date: 202510262359
ISSUE DATE / TIME: 202510031404
ISSUE DATE / TIME: 202510031740
Unit Type and Rh: 5100
Unit Type and Rh: 5100

## 2024-03-12 LAB — CBC
HCT: 30.4 % — ABNORMAL LOW (ref 36.0–46.0)
Hemoglobin: 9.9 g/dL — ABNORMAL LOW (ref 12.0–15.0)
MCH: 30.7 pg (ref 26.0–34.0)
MCHC: 32.6 g/dL (ref 30.0–36.0)
MCV: 94.1 fL (ref 80.0–100.0)
Platelets: 427 K/uL — ABNORMAL HIGH (ref 150–400)
RBC: 3.23 MIL/uL — ABNORMAL LOW (ref 3.87–5.11)
RDW: 16.1 % — ABNORMAL HIGH (ref 11.5–15.5)
WBC: 8.3 K/uL (ref 4.0–10.5)
nRBC: 0 % (ref 0.0–0.2)

## 2024-03-12 LAB — URINE CULTURE: Culture: NO GROWTH

## 2024-03-12 MED ORDER — SODIUM CHLORIDE 0.9 % IV SOLN: INTRAVENOUS | Status: DC

## 2024-03-12 NOTE — Progress Notes (Signed)
 PROGRESS NOTE  Robin Blackburn, is a 78 y.o. female, DOB - 1946/06/02, FMW:[id number]  Admit date - 03/10/2024   Admitting Physician Robin FORBES Carwin, MD  Outpatient Primary MD for the patient is Robin Satterfield, MD  LOS - 2  Chief Complaint  Patient presents with   Abnormal Labs      Brief Narrative:  78 y.o. 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 and adjust IV fluid rate type -renally adjust medications, avoid nephrotoxic agents / dehydration  / hypotension  2) acute on chronic symptomatic anemia--hemoglobin down to 5.9 from 7.9 on admission -Hgb up to 9.9 after 2 units of PRBC on 03/28/2024 - Recent baseline hemoglobin usually between 9 and 10 - No obvious bleeding---patient is on Brilinta  -Hold heparin  for DVT prophylaxis for now- use SCDs instead - Ferritin was 214, B12 was 617, folate was 19.2, serum iron was 39, iron saturation was 15 TIBC was 261 on 02/01/2024 - Check stool occult blood - Defer decision of possible Procrit/Epogen to nephrology service  3) hyperkalemia and anion gap metabolic acidosis--in the setting of #1 above - Sodium normalized with interventions  4)Obstructive uropathy with possible UTI - CT renal stone protocol shows--Findings compatible with bladder outlet obstruction, with persistent bladder wall thickening and stable bilateral hydroureteronephrosis. Perivesicular Fat stranding and uroepithelial mural thickening consistent with cystitis and urinary tract  infection. Please correlate with urinalysis. - Curbside conversation with Robin Blackburn from urology service-recommends Foley catheter insertion - Foley placed 03/11/2024 per urologist recommendation -urine culture 01/04/2024 grew multidrug-resistant Klebsiella -  - Repeat urine culture from 03/10/2024 NGTD -Okay to stop meropenem   5)Hypertension -Blood pressure soft - Hold torsemide  20 mg daily, hold metoprolol  and Norvasc     6)Rheumatoid arthritis- stable, on Arava  and Humira - Hold in the setting of concerns for possible acute infection  7)Social/Ethics--- as per patient her friend Robin Blackburn is a primary contact - Patient is a full code  Status is: Inpatient   Disposition: The patient is from: SNF--Robin Blackburn              Anticipated d/c is to: SNF              Anticipated d/c date is: > 3 days              Patient currently is not medically stable to d/c. Barriers: Not Clinically Stable-   Code Status :  -  Code Status: Full Code   Family Communication:    NA (patient is alert, awake and coherent)   as per patient her friend Robin Blackburn is a primary contact  DVT Prophylaxis  :   - SCDs   Place and maintain sequential compression device Start: 03/11/24 2024 Place TED hose Start: 03/11/24 2024   Lab Results  Component Value Date   PLT 427 (H) 03/12/2024    Inpatient Medications  Scheduled Meds:  sodium chloride    Intravenous Once   ALPRAZolam   0.5 mg Oral BID   Chlorhexidine  Gluconate Cloth  6 each Topical Daily   gabapentin   300 mg  Oral TID   sodium zirconium cyclosilicate   10 g Oral Daily   [START ON 03/13/2024] ticagrelor   60 mg Oral Daily   traZODone   100 mg Oral QHS   Continuous Infusions:  sodium chloride      meropenem  (MERREM ) IV 500 mg (03/11/24 2141)   PRN Meds:.acetaminophen  **OR** acetaminophen , ondansetron  **OR** ondansetron  (ZOFRAN ) IV, polyethylene glycol   Anti-infectives (From admission, onward)    Start      Dose/Rate Route Frequency Ordered Stop   03/11/24 2200  meropenem  (MERREM ) 500 mg in sodium chloride  0.9 % 100 mL IVPB        500 mg 200 mL/hr over 30 Minutes Intravenous Every 24 hours 03/10/24 2318     03/10/24 2330  meropenem  (MERREM ) 1 g in sodium chloride  0.9 % 100 mL IVPB        1 g 200 mL/hr over 30 Minutes Intravenous  Once 03/10/24 2317 03/10/24 2356       Subjective: Robin Blackburn today has no fevers, no emesis,  No chest pain,   - Resting comfortably - Appetite is not great  Objective: Vitals:   03/11/24 2338 03/12/24 0100 03/12/24 0514 03/12/24 1318  BP: 132/72  (!) 115/55 115/71  Pulse: 96  81 94  Resp: 16  18 18   Temp: (!) 102.6 F (39.2 C) 100.1 F (37.8 C) 98.1 F (36.7 C) 98.6 F (37 C)  TempSrc: Oral Oral Axillary Oral  SpO2: 99%  99% 97%  Weight:      Height:        Intake/Output Summary (Last 24 hours) at 03/12/2024 1907 Last data filed at 03/12/2024 1828 Gross per 24 hour  Intake 2324.68 ml  Output 1300 ml  Net 1024.68 ml   Filed Weights   03/10/24 1345  Weight: 72 kg    Physical Exam  Gen:- Awake Alert, chronically ill-appearing, no acute distress HEENT:- [place].AT, No sclera icterus Neck-Supple Neck,No JVD,.  Lungs-  CTAB , fair symmetrical air movement CV- S1, S2 normal, regular  Abd-  +ve B.Sounds, Abd Soft, No tenderness, No CVA area tenderness  Extremity/Skin:- No  edema, pedal pulses present  Psych-affect is appropriate, oriented x3 Neuro-no new focal deficits, no tremors GU-Foley in situ--urologist rec foley MSK-right arm PICC line in situ  Data Reviewed: I have personally reviewed following labs and imaging studies  CBC: Recent Labs  Lab 03/10/24 1429 03/11/24 0507 03/12/24 1752  WBC 13.1* 8.8 8.3  NEUTROABS 10.3*  --   --   HGB 7.9* 5.9* 9.9*  HCT 27.0* 20.0* 30.4*  MCV 104.7* 100.0 94.1  PLT 486* 411* 427*   Basic Metabolic Panel: Recent Labs  Lab 03/10/24 1429 03/11/24 0507 03/12/24 1752  NA 127* 131* 135  K  6.2* 5.3* 4.5  CL 97* 103 100  CO2 13* 12* 19*  GLUCOSE 128* 100* 139*  BUN 82* 78* 71*  CREATININE 6.28* 6.14* 5.60*  CALCIUM  7.9* 7.1* 7.7*   GFR: Estimated Creatinine Clearance: 8.4 mL/min (A) (by C-G formula based on SCr of 5.6 mg/dL (H)). Liver Function Tests: Recent Labs  Lab 03/10/24 1429  AST 14*  ALT <5  ALKPHOS 210*  BILITOT 0.4  PROT 7.4  ALBUMIN 3.0*   Cardiac Enzymes: Recent Labs  Lab 03/10/24 1429  CKTOTAL 69   Recent Results (from the past 240 hours)  Resp panel by RT-PCR (RSV, Flu A&B, Covid) Anterior Nasal Swab     Status: None   Collection Time: 03/10/24  2:48 PM   Specimen: Anterior  Nasal Swab  Result Value Ref Range Status   SARS Coronavirus 2 by RT PCR NEGATIVE NEGATIVE Final    Comment: (NOTE) SARS-CoV-2 target nucleic acids are NOT DETECTED.  The SARS-CoV-2 RNA is generally detectable in upper respiratory specimens during the acute phase of infection. The lowest concentration of SARS-CoV-2 viral copies this assay can detect is 138 copies/mL. A negative result does not preclude SARS-Cov-2 infection and should not be used as the sole basis for treatment or other patient management decisions. A negative result may occur with  improper specimen collection/handling, submission of specimen other than nasopharyngeal swab, presence of viral mutation(s) within the areas targeted by this assay, and inadequate number of viral copies(<138 copies/mL). A negative result must be combined with clinical observations, patient history, and epidemiological information. The expected result is Negative.  Fact Sheet for Patients:  BloggerCourse.com  Fact Sheet for Healthcare Providers:  SeriousBroker.it  This test is no t yet approved or cleared by the [place]  FDA and  has been authorized for detection and/or diagnosis of SARS-CoV-2 by FDA under an Emergency Use Authorization (EUA). This EUA will remain   in effect (meaning this test can be used) for the duration of the COVID-19 declaration under Section 564(b)(1) of the Act, 21 U.S.C.section 360bbb-3(b)(1), unless the authorization is terminated  or revoked sooner.       Influenza A by PCR NEGATIVE NEGATIVE Final   Influenza B by PCR NEGATIVE NEGATIVE Final    Comment: (NOTE) The Xpert Xpress SARS-CoV-2/FLU/RSV plus assay is intended as an aid in the diagnosis of influenza from Nasopharyngeal swab specimens and should not be used as a sole basis for treatment. Nasal washings and aspirates are unacceptable for Xpert Xpress SARS-CoV-2/FLU/RSV testing.  Fact Sheet for Patients: BloggerCourse.com  Fact Sheet for Healthcare Providers: SeriousBroker.it  This test is not yet approved or cleared by the [place]  FDA and has been authorized for detection and/or diagnosis of SARS-CoV-2 by FDA under an Emergency Use Authorization (EUA). This EUA will remain in effect (meaning this test can be used) for the duration of the COVID-19 declaration under Section 564(b)(1) of the Act, 21 U.S.C. section 360bbb-3(b)(1), unless the authorization is terminated or revoked.     Resp Syncytial Virus by PCR NEGATIVE NEGATIVE Final    Comment: (NOTE) Fact Sheet for Patients: BloggerCourse.com  Fact Sheet for Healthcare Providers: SeriousBroker.it  This test is not yet approved or cleared by the [place]  FDA and has been authorized for detection and/or diagnosis of SARS-CoV-2 by FDA under an Emergency Use Authorization (EUA). This EUA will remain in effect (meaning this test can be used) for the duration of the COVID-19 declaration under Section 564(b)(1) of the Act, 21 U.S.C. section 360bbb-3(b)(1), unless the authorization is terminated or revoked.  Performed at [facility], [address], [place], [place] [address]   Urine Culture      Status: None   Collection Time: 03/10/24  8:30 PM   Specimen: Urine, Clean Catch  Result Value Ref Range Status   Specimen Description   Final    URINE, CLEAN CATCH Performed at [facility], [address] [place], [place] [address]    Special Requests   Final    NONE Performed at [facility], [address], [place], [place] [address]    Culture   Final    NO GROWTH Performed at [facility], [address] [place], [place] [address]    Report Status 03/12/2024 FINAL  Final  MRSA Next Gen by PCR, Nasal     Status: Abnormal   Collection Time: 03/10/24  8:57 PM   Specimen: Nasal Mucosa; Nasal Swab  Result Value Ref Range Status   MRSA by PCR Next Gen DETECTED (A) NOT DETECTED Final    Comment: RESULT CALLED TO, READ BACK BY AND VERIFIED WITH: M MARSH,RN@2353  03/10/24 MK (NOTE) The GeneXpert MRSA Assay (FDA approved for NASAL specimens only), is one component of a comprehensive MRSA colonization surveillance program. It is not intended to diagnose MRSA infection nor to guide or monitor treatment for MRSA infections. Test performance is not FDA approved in patients less than 61 years old. Performed at [facility], [address] [place], [place] [address]     Radiology Studies: No results found.  Scheduled Meds:  sodium chloride    Intravenous Once   ALPRAZolam   0.5 mg Oral BID   Chlorhexidine  Gluconate Cloth  6 each Topical Daily   gabapentin   300 mg Oral TID   sodium zirconium cyclosilicate   10 g Oral Daily   [START ON 03/13/2024] ticagrelor   60 mg Oral Daily   traZODone   100 mg Oral QHS   Continuous Infusions:  sodium chloride      meropenem  (MERREM ) IV 500 mg (03/11/24 2141)    LOS: 2 days   Rendall Blackburn M.D on 03/12/2024 at 7:07 PM  Go to [email or web address] - for contact info  [facility] - Office  [phone]  If 7PM-7AM, please contact night-coverage [email or web address] 03/12/2024, 7:07 PM

## 2024-03-12 NOTE — Plan of Care (Signed)

## 2024-03-13 DIAGNOSIS — I10 Essential (primary) hypertension: Secondary | ICD-10-CM | POA: Diagnosis not present

## 2024-03-13 DIAGNOSIS — N179 Acute kidney failure, unspecified: Secondary | ICD-10-CM | POA: Diagnosis not present

## 2024-03-13 DIAGNOSIS — N1832 Chronic kidney disease, stage 3b: Secondary | ICD-10-CM

## 2024-03-13 DIAGNOSIS — N320 Bladder-neck obstruction: Secondary | ICD-10-CM | POA: Diagnosis not present

## 2024-03-13 DIAGNOSIS — M069 Rheumatoid arthritis, unspecified: Secondary | ICD-10-CM | POA: Diagnosis not present

## 2024-03-13 LAB — RENAL FUNCTION PANEL
Albumin: 2.4 g/dL — ABNORMAL LOW (ref 3.5–5.0)
Anion gap: 19 — ABNORMAL HIGH (ref 5–15)
BUN: 66 mg/dL — ABNORMAL HIGH (ref 8–23)
CO2: 12 mmol/L — ABNORMAL LOW (ref 22–32)
Calcium: 7.5 mg/dL — ABNORMAL LOW (ref 8.9–10.3)
Chloride: 103 mmol/L (ref 98–111)
Creatinine, Ser: 4.81 mg/dL — ABNORMAL HIGH (ref 0.44–1.00)
GFR, Estimated: 9 mL/min — ABNORMAL LOW (ref >60)
Glucose, Bld: 94 mg/dL (ref 70–99)
Phosphorus: 5.9 mg/dL — ABNORMAL HIGH (ref 2.5–4.6)
Potassium: 4.9 mmol/L (ref 3.5–5.1)
Sodium: 134 mmol/L — ABNORMAL LOW (ref 135–145)

## 2024-03-13 LAB — HEMOGLOBIN AND HEMATOCRIT, BLOOD
HCT: 32.6 % — ABNORMAL LOW (ref 36.0–46.0)
Hemoglobin: 10.2 g/dL — ABNORMAL LOW (ref 12.0–15.0)

## 2024-03-13 MED ORDER — TRAMADOL HCL 50 MG PO TABS
50.0000 mg | ORAL_TABLET | Freq: Once | ORAL | Status: AC | PRN
  Administered 2024-03-13: 50 mg via ORAL
  Filled 2024-03-13: qty 1

## 2024-03-13 MED ORDER — SODIUM BICARBONATE 8.4 % IV SOLN
INTRAVENOUS | Status: AC
  Filled 2024-03-13 (×2): qty 1000

## 2024-03-13 NOTE — Progress Notes (Signed)
 PROGRESS NOTE  Robin Blackburn FMW:[id number] DOB: [birth year] DOA: 03/10/2024 PCP: Bertell Satterfield, MD  Brief History:  78 y.o. female with medical history significant for rheumatoid arthritis, stroke, fibromyalgia, hypertension, chronic pain. Patient was brought to the ED with reports of abnormal lab.  Patient reports chronic poor oral intake because she does not like the food at the nursing home, she denies vomiting or diarrhea.  She reports no abdominal pain and pain with urination over the past few days.  She reports she has not been able to void well recently, she was found to have acute on chronic renal failure secondary to bladder outlet obstruction.  Nephrology was consulted to assist with management.  Case was also discussed with urology.  Foley catheter was placed on 03/11/2024.  Since catheter insertion, her renal function is slowly improving.   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--probably related to obstructive uropathy see #4 below - creatinine on admission=6.28 - baseline creatinine =  1.4-1.7 - creatinine is trending down--currently 4.81 -Nephrology consult appreciated -restart bicarb drip 10/5 -renally adjust medications, avoid nephrotoxic agents / dehydration  / hypotension   2) acute on chronic symptomatic anemia--hemoglobin down to 5.9 from 7.9 on admission -Hgb up to 9.9 after 2 units of PRBC on 03/10/2024 - Recent baseline hemoglobin usually between 9 and 10 - No obvious bleeding---patient is on Brilinta  -Hold heparin  for DVT prophylaxis for now- use SCDs instead - Ferritin was 214, B12 was 617, folate was 19.2, serum iron was 39, iron saturation was 15 TIBC was 261 on 02/01/2024 - Check stool occult blood--neg - Defer decision of possible Procrit/Epogen to nephrology service   3) hyperkalemia and anion gap metabolic acidosis--in the setting of #1 above - restart bicarbonate drip 10/5   4)Obstructive uropathy with possible UTI -  10/2 CT renal stone protocol shows--Findings compatible with bladder outlet obstruction, with persistent bladder wall thickening and stable bilateral hydroureteronephrosis. Perivesicular Fat stranding and uroepithelial mural thickening consistent with cystitis and urinary tract infection.  - Curbside conversation with Dr. Sherrilee from urology service-recommends Foley catheter insertion - Foley placed 03/11/2024 per urologist recommendation -urine culture 01/04/2024 grew multidrug-resistant Klebsiella -  - Repeat urine culture from 03/10/2024 NGTD -Okay to stop meropenem  (10/2>>10/4)   5)Hypertension -Blood pressure soft - Hold torsemide  20 mg daily, hold metoprolol  and Norvasc     6)Rheumatoid arthritis- stable, on Arava  and Humira - Hold in the setting of concerns for possible acute infection   7)Social/Ethics--- as per patient her friend Mrs Dozier Jenny is a primary contact - Patient is a full code         Family Communication:   no Family at bedside--as per patient her friend Mrs Dozier Jenny is a primary contact   Consultants:  renal, urology  Code Status:  FULL   DVT Prophylaxis:  SCDs   Procedures: As Listed in Progress Note Above  Antibiotics: Merrem  10/2>>10/4     Subjective: Patient denies fevers, chills, headache, chest pain, dyspnea, nausea, vomiting, diarrhea, abdominal pain, dysuria,   Objective: Vitals:   03/12/24 0514 03/12/24 1318 03/12/24 2035 03/13/24 0554  BP: (!) 115/55 115/71 138/64 139/69  Pulse: 81 94 80 89  Resp: 18 18 18    Temp: 98.1 F (36.7 C) 98.6 F (37 C) 98.3 F (36.8 C) 99.8 F (37.7 C)  TempSrc: Axillary Oral Oral Oral  SpO2: 99% 97% 100% 97%  Weight:      Height:  Intake/Output Summary (Last 24 hours) at 03/13/2024 1002 Last data filed at 03/13/2024 0600 Gross per 24 hour  Intake 960 ml  Output 1750 ml  Net -790 ml   Weight change:  Exam:  General:  Pt is alert, follows commands appropriately, not  in acute distress HEENT: No icterus, No thrush, No neck mass, [place]/AT Cardiovascular: RRR, S1/S2, no rubs, no gallops Respiratory: CTA bilaterally, no wheezing, no crackles, no rhonchi Abdomen: Soft/+BS, non tender, non distended, no guarding Extremities: No edema, No lymphangitis, No petechiae, No rashes, no synovitis   Data Reviewed: I have personally reviewed following labs and imaging studies Basic Metabolic Panel: Recent Labs  Lab 03/10/24 1429 03/11/24 0507 03/12/24 1752 03/13/24 0440  NA 127* 131* 135 134*  K 6.2* 5.3* 4.5 4.9  CL 97* 103 100 103  CO2 13* 12* 19* 12*  GLUCOSE 128* 100* 139* 94  BUN 82* 78* 71* 66*  CREATININE 6.28* 6.14* 5.60* 4.81*  CALCIUM  7.9* 7.1* 7.7* 7.5*  PHOS  --   --   --  5.9*   Liver Function Tests: Recent Labs  Lab 03/10/24 1429 03/13/24 0440  AST 14*  --   ALT <5  --   ALKPHOS 210*  --   BILITOT 0.4  --   PROT 7.4  --   ALBUMIN 3.0* 2.4*   No results for input(s): LIPASE, AMYLASE in the last 168 hours. No results for input(s): AMMONIA in the last 168 hours. Coagulation Profile: No results for input(s): INR, PROTIME in the last 168 hours. CBC: Recent Labs  Lab 03/10/24 1429 03/11/24 0507 03/12/24 1752 03/13/24 0440  WBC 13.1* 8.8 8.3  --   NEUTROABS 10.3*  --   --   --   HGB 7.9* 5.9* 9.9* 10.2*  HCT 27.0* 20.0* 30.4* 32.6*  MCV 104.7* 100.0 94.1  --   PLT 486* 411* 427*  --    Cardiac Enzymes: Recent Labs  Lab 03/10/24 1429  CKTOTAL 69   BNP: Invalid input(s): POCBNP CBG: Recent Labs  Lab 03/10/24 2231  GLUCAP 117*   HbA1C: No results for input(s): HGBA1C in the last 72 hours. Urine analysis:    Component Value Date/Time   COLORURINE AMBER (A) 03/10/2024 2030   APPEARANCEUR TURBID (A) 03/10/2024 2030   APPEARANCEUR Cloudy (A) 02/10/2023 1454   LABSPEC 1.010 03/10/2024 2030   PHURINE 5.0 03/10/2024 2030   GLUCOSEU NEGATIVE 03/10/2024 2030   HGBUR NEGATIVE 03/10/2024 2030   BILIRUBINUR  NEGATIVE 03/10/2024 2030   BILIRUBINUR Negative 02/10/2023 1454   KETONESUR NEGATIVE 03/10/2024 2030   PROTEINUR 100 (A) 03/10/2024 2030   UROBILINOGEN 0.2 04/10/2015 2212   NITRITE NEGATIVE 03/10/2024 2030   LEUKOCYTESUR LARGE (A) 03/10/2024 2030   Sepsis Labs: @LABRCNTIP (procalcitonin:4,lacticidven:4) ) Recent Results (from the past 240 hours)  Resp panel by RT-PCR (RSV, Flu A&B, Covid) Anterior Nasal Swab     Status: None   Collection Time: 03/10/24  2:48 PM   Specimen: Anterior Nasal Swab  Result Value Ref Range Status   SARS Coronavirus 2 by RT PCR NEGATIVE NEGATIVE Final    Comment: (NOTE) SARS-CoV-2 target nucleic acids are NOT DETECTED.  The SARS-CoV-2 RNA is generally detectable in upper respiratory specimens during the acute phase of infection. The lowest concentration of SARS-CoV-2 viral copies this assay can detect is 138 copies/mL. A negative result does not preclude SARS-Cov-2 infection and should not be used as the sole basis for treatment or other patient management decisions. A negative result may occur  with  improper specimen collection/handling, submission of specimen other than nasopharyngeal swab, presence of viral mutation(s) within the areas targeted by this assay, and inadequate number of viral copies(<138 copies/mL). A negative result must be combined with clinical observations, patient history, and epidemiological information. The expected result is Negative.  Fact Sheet for Patients:  BloggerCourse.com  Fact Sheet for Healthcare Providers:  SeriousBroker.it  This test is no t yet approved or cleared by the [place]  FDA and  has been authorized for detection and/or diagnosis of SARS-CoV-2 by FDA under an Emergency Use Authorization (EUA). This EUA will remain  in effect (meaning this test can be used) for the duration of the COVID-19 declaration under Section 564(b)(1) of the Act,  21 U.S.C.section 360bbb-3(b)(1), unless the authorization is terminated  or revoked sooner.       Influenza A by PCR NEGATIVE NEGATIVE Final   Influenza B by PCR NEGATIVE NEGATIVE Final    Comment: (NOTE) The Xpert Xpress SARS-CoV-2/FLU/RSV plus assay is intended as an aid in the diagnosis of influenza from Nasopharyngeal swab specimens and should not be used as a sole basis for treatment. Nasal washings and aspirates are unacceptable for Xpert Xpress SARS-CoV-2/FLU/RSV testing.  Fact Sheet for Patients: BloggerCourse.com  Fact Sheet for Healthcare Providers: SeriousBroker.it  This test is not yet approved or cleared by the [place]  FDA and has been authorized for detection and/or diagnosis of SARS-CoV-2 by FDA under an Emergency Use Authorization (EUA). This EUA will remain in effect (meaning this test can be used) for the duration of the COVID-19 declaration under Section 564(b)(1) of the Act, 21 U.S.C. section 360bbb-3(b)(1), unless the authorization is terminated or revoked.     Resp Syncytial Virus by PCR NEGATIVE NEGATIVE Final    Comment: (NOTE) Fact Sheet for Patients: BloggerCourse.com  Fact Sheet for Healthcare Providers: SeriousBroker.it  This test is not yet approved or cleared by the [place]  FDA and has been authorized for detection and/or diagnosis of SARS-CoV-2 by FDA under an Emergency Use Authorization (EUA). This EUA will remain in effect (meaning this test can be used) for the duration of the COVID-19 declaration under Section 564(b)(1) of the Act, 21 U.S.C. section 360bbb-3(b)(1), unless the authorization is terminated or revoked.  Performed at [facility], [address], [place], [place] [address]   Urine Culture     Status: None   Collection Time: 03/10/24  8:30 PM   Specimen: Urine, Clean Catch  Result Value Ref Range Status    Specimen Description   Final    URINE, CLEAN CATCH Performed at [facility], [address] [place], [place] [address]    Special Requests   Final    NONE Performed at [facility], [address] [place], [place] [address]    Culture   Final    NO GROWTH Performed at [facility], [address], [place], [place] [address]    Report Status 03/12/2024 FINAL  Final  MRSA Next Gen by PCR, Nasal     Status: Abnormal   Collection Time: 03/10/24  8:57 PM   Specimen: Nasal Mucosa; Nasal Swab  Result Value Ref Range Status   MRSA by PCR Next Gen DETECTED (A) NOT DETECTED Final    Comment: RESULT CALLED TO, READ BACK BY AND VERIFIED WITH: M MARSH,RN@2353  03/10/24 MK (NOTE) The GeneXpert MRSA Assay (FDA approved for NASAL specimens only), is one component of a comprehensive MRSA colonization surveillance program. It is not intended to diagnose MRSA infection  nor to guide or monitor treatment for MRSA infections. Test performance is not FDA approved in patients less than 49 years old. Performed at [facility], [address], [place], [place] [address]      Scheduled Meds:  ALPRAZolam   0.5 mg Oral BID   Chlorhexidine  Gluconate Cloth  6 each Topical Daily   gabapentin   300 mg Oral TID   sodium zirconium cyclosilicate   10 g Oral Daily   ticagrelor   60 mg Oral Daily   traZODone   100 mg Oral QHS   Continuous Infusions:  Procedures/Studies: US  Renal Result Date: 03/10/2024 EXAM: US  Retroperitoneum Complete, Renal. CLINICAL HISTORY: acute kidney failure. TECHNIQUE: Real-time ultrasound of the retroperitoneum (complete) with image documentation. COMPARISON: CT dated 03/10/2024. FINDINGS: RIGHT KIDNEY: Right kidney measures 10.2 x 5.5 x 5.4 cm. Volume is 161 cc. Moderate hydroureteronephrosis, as on CT. No renal stone or mass visualized. LEFT KIDNEY: Left kidney measures 10.6 x 5.9 x 4.6 cm. Volume is 154 cc. Moderate hydroureteronephrosis, as on CT. No renal stone or mass visualized.  BLADDER: Mild bladder wall thickening. Prevoid Bladder Volume of 42 cc. IMPRESSION: 1. Moderate bilateral hydroureteronephrosis. 2. Mild bladder wall thickening, likely due to bladder outlet obstruction. Electronically signed by: Rockey Kilts MD 03/10/2024 05:17 PM EDT RP Workstation: [id number]   CT Renal Stone Study Result Date: 03/10/2024 EXAM: CT UROGRAM 03/10/2024 03:58:52 PM TECHNIQUE: CT of the abdomen and pelvis was performed without the administration of intravenous contrast. Multiplanar reformatted images as well as MIP urogram images are provided for review. Automated exposure control, iterative reconstruction, and/or weight based adjustment of the mA/kV was utilized to reduce the radiation dose to as low as reasonably achievable. COMPARISON: 01/07/2024 CLINICAL HISTORY: acute kidney failure. Pt brb EMS from [facility] for Abnormal Labs. All V/S WDL. No other complaints at this time. FINDINGS: LOWER CHEST: Small bilateral pleural effusions, right greater than left. Minimal dependent lower lobe atelectasis. LIVER: The liver is unremarkable. GALLBLADDER AND BILE DUCTS: Gallbladder is unremarkable. No biliary ductal dilatation. SPLEEN: No acute abnormality. PANCREAS: No acute abnormality. ADRENAL GLANDS: No acute abnormality. KIDNEYS, URETERS AND BLADDER: There is continued bilateral hydroureteronephrosis unchanged since prior exam. Stable bilateral mucosal thickening throughout the renal pelvis and bilateral ureters. Persistent bladder wall thickening with multiple bladder diverticula, consistent with sequela of bladder outlet obstruction. Perivesicular fat stranding could reflect superimposed cystitis. No stones in the kidneys or ureters. No perinephric or periureteral stranding. GI AND BOWEL: Stomach demonstrates no acute abnormality. Sigmoid diverticulosis without evidence of acute diverticulitis. Appendix is surgically absent. There is no bowel obstruction. PERITONEUM AND RETROPERITONEUM: No  ascites. No free air. VASCULATURE: Aorta is normal in caliber. Aortic atherosclerosis. LYMPH NODES: No lymphadenopathy. REPRODUCTIVE ORGANS: No acute abnormality. BONES AND SOFT TISSUES: No acute osseous abnormality. No focal soft tissue abnormality. IMPRESSION: 1. Findings compatible with bladder outlet obstruction, with persistent bladder wall thickening and stable bilateral hydroureteronephrosis. Perivesicular Fat stranding and uroepithelial mural thickening consistent with cystitis and urinary tract infection. Please correlate with urinalysis. 2. No urinary tract calculi. 3. Sigmoid diverticulosis without diverticulitis. Electronically signed by: Ozell Daring MD 03/10/2024 04:05 PM EDT RP Workstation: [id number]   DG Chest 2 View Result Date: 02/23/2024 EXAM: 2 VIEW(S) XRAY OF THE CHEST 02/18/2024 02:27:39 PM COMPARISON: 01/13/2024 CLINICAL HISTORY: Pneumonia. FINDINGS: LINES, TUBES AND DEVICES: Stable right PICC line with tip in mid SVC. LUNGS AND PLEURA: Left basilar scarring. No focal pulmonary opacity. No pulmonary edema. No pleural effusion. No pneumothorax. HEART AND MEDIASTINUM: No acute abnormality of  the cardiac and mediastinal silhouettes. Aortic atherosclerotic calcification. BONES AND SOFT TISSUES: No acute osseous abnormality. IMPRESSION: 1. No acute findings. 2. Stable right PICC line with tip in mid SVC. Electronically signed by: Katheleen Faes MD 02/23/2024 01:32 PM EDT RP Workstation: [id number]    Alm Schneider, DO  [facility]  If 7PM-7AM, please contact night-coverage [email or web address] Password [email or web address] 03/13/2024, 10:02 AM   LOS: 3 days

## 2024-03-13 NOTE — Hospital Course (Signed)
 Brief Narrative:   78 year old female with history of rheumatoid arthritis, stroke, fibromyalgia, HTN, chronic pain comes to the ED due to abnormal labs, poor oral intake.  Patient was found to have acute kidney injury likely from bladder outlet obstruction requiring Foley catheter placement.  Due to severe acidosis also was started on bicarb drip.  Baseline creatinine 1.7, admission creatinine 6.28.  Creatinine is slowly improving. Hospital course complicated by question of dysarthria therefore MRI of brain performed which is negative.  Eventually renal function stabilized and was slowly improving with Foley catheter in place.  Patient is recommended to help follow-up with Dr. Sherrilee from urology outpatient.  I have also advised that she discuss future need for colonoscopy and endoscopy with GI and PCP.  Currently hemoglobin is stable at 8.9.  For now also started iron supplements with bowel regimen.  Medically stable for discharge.  Assessment & Plan:   Acute kidney injury on CKD stage IIIb Metabolic acidosis Hyperkalemia, resolved -Baseline creatinine 1.7, admission creatinine 6.28.  Likely from obstructive uropathy.  Improving since Foley catheter placement.  Also required bicarb drip.  Acidosis is improving.  Discussed with nephrology today on the day of discharge.  Cleared for discharge and their group will help arrange for outpatient follow-up.  Appreciate their assistance    Obstructive uropathy with possible UTI -Renal CT 10/2-shows complete bladder outlet obstruction with bilateral hydronephrosis without any evidence of renal stones.  Prior provider discussed case with Dr. Sherrilee from urology who recommends Foley catheter placement.  Patient completed 3 days of meropenem  on 10/4.  Plan is to continue Foley catheter with outpatient urology follow-up.  Renal function continues to slowly improve   Acute on chronic symptomatic anemia--hemoglobin down to 5.9 from 7.9 on  admission Baseline hemoglobin 9.0, slowly trended down to 5.9.  Hemoccult is negative without any obvious signs of bleeding.  Required 2 units of PRBC transfusion on 10/3.  Patient is now back on Brilinta , hemoglobin 8.9. -Outpatient iron studies in August were unremarkable. -Patient should follow-up outpatient GI and PCP to ensure she is up-to-date with her colonoscopy  Dysarthria, uncontrolled -Wonder if this was uremia related.  MRI of the brain is negative for acute CVA   Hypertension -Hold home diuretics.  Will slowly resume home Norvasc  and Toprol    Rheumatoid arthritis- stable, on Arava  and Humira - Hold in the setting of concerns for possible acute infection   Social/Ethics--- as per patient her friend Mrs Dozier Jenny is a primary contact - Patient is a full code  DVT prophylaxis: Place and maintain sequential compression device Start: 03/11/24 2024 Place TED hose Start: 03/11/24 2024      Code Status: Full Code Family Communication:   Status is: Inpatient Ok to [employer]   PT Follow up Recs: Skilled Nursing-Short Term Rehab (<3 Hours/Day)03/14/2024 1504  Subjective:  feeling well no complaints  Examination:  General exam: Appears calm and comfortable  Respiratory system: Clear to auscultation. Respiratory effort normal. Cardiovascular system: S1 & S2 heard, RRR. No JVD, murmurs, rubs, gallops or clicks. No pedal edema. Gastrointestinal system: Abdomen is nondistended, soft and nontender. No organomegaly or masses felt. Normal bowel sounds heard. Central nervous system: Alert and oriented. No focal neurological deficits. Extremities: Symmetric 5 x 5 power. Skin: No rashes, lesions or ulcers Psychiatry: Judgement and insight appear normal. Mood & affect appropriate. Foley in place

## 2024-03-14 ENCOUNTER — Inpatient Hospital Stay (HOSPITAL_COMMUNITY)

## 2024-03-14 DIAGNOSIS — R471 Dysarthria and anarthria: Secondary | ICD-10-CM | POA: Diagnosis not present

## 2024-03-14 DIAGNOSIS — R29818 Other symptoms and signs involving the nervous system: Secondary | ICD-10-CM | POA: Diagnosis not present

## 2024-03-14 DIAGNOSIS — M069 Rheumatoid arthritis, unspecified: Secondary | ICD-10-CM | POA: Diagnosis not present

## 2024-03-14 DIAGNOSIS — N179 Acute kidney failure, unspecified: Secondary | ICD-10-CM | POA: Diagnosis not present

## 2024-03-14 DIAGNOSIS — N320 Bladder-neck obstruction: Secondary | ICD-10-CM | POA: Diagnosis not present

## 2024-03-14 DIAGNOSIS — R9089 Other abnormal findings on diagnostic imaging of central nervous system: Secondary | ICD-10-CM | POA: Diagnosis not present

## 2024-03-14 DIAGNOSIS — Z8673 Personal history of transient ischemic attack (TIA), and cerebral infarction without residual deficits: Secondary | ICD-10-CM | POA: Diagnosis not present

## 2024-03-14 LAB — RENAL FUNCTION PANEL
Albumin: 2.5 g/dL — ABNORMAL LOW (ref 3.5–5.0)
Anion gap: 15 (ref 5–15)
BUN: 54 mg/dL — ABNORMAL HIGH (ref 8–23)
CO2: 21 mmol/L — ABNORMAL LOW (ref 22–32)
Calcium: 7.5 mg/dL — ABNORMAL LOW (ref 8.9–10.3)
Chloride: 103 mmol/L (ref 98–111)
Creatinine, Ser: 3.87 mg/dL — ABNORMAL HIGH (ref 0.44–1.00)
GFR, Estimated: 11 mL/min — ABNORMAL LOW (ref >60)
Glucose, Bld: 101 mg/dL — ABNORMAL HIGH (ref 70–99)
Phosphorus: 4.7 mg/dL — ABNORMAL HIGH (ref 2.5–4.6)
Potassium: 3.4 mmol/L — ABNORMAL LOW (ref 3.5–5.1)
Sodium: 139 mmol/L (ref 135–145)

## 2024-03-14 LAB — CBC
HCT: 28.3 % — ABNORMAL LOW (ref 36.0–46.0)
Hemoglobin: 9.1 g/dL — ABNORMAL LOW (ref 12.0–15.0)
MCH: 30.3 pg (ref 26.0–34.0)
MCHC: 32.2 g/dL (ref 30.0–36.0)
MCV: 94.3 fL (ref 80.0–100.0)
Platelets: 467 K/uL — ABNORMAL HIGH (ref 150–400)
RBC: 3 MIL/uL — ABNORMAL LOW (ref 3.87–5.11)
RDW: 15.3 % (ref 11.5–15.5)
WBC: 5.7 K/uL (ref 4.0–10.5)
nRBC: 0 % (ref 0.0–0.2)

## 2024-03-14 MED ORDER — MUPIROCIN 2 % EX OINT
1.0000 | TOPICAL_OINTMENT | Freq: Two times a day (BID) | CUTANEOUS | Status: DC
  Administered 2024-03-14 – 2024-03-15 (×3): 1 via NASAL
  Filled 2024-03-14: qty 22

## 2024-03-14 NOTE — Progress Notes (Signed)
 Patient ID: Robin Blackburn, female   DOB: Jan 14, 1946, 78 y.o.   MRN: [id number] S: Complaining of arthritis pain. O:BP (!) 146/64 (BP Location: Left Arm)   Pulse 79   Temp 98.6 F (37 C) (Oral)   Resp 16   Ht 5' 6 (1.676 m)   Wt 72 kg   SpO2 95%   BMI 25.62 kg/m   Intake/Output Summary (Last 24 hours) at 03/14/2024 0826 Last data filed at 03/14/2024 0807 Gross per 24 hour  Intake 360 ml  Output 2100 ml  Net -1740 ml   Intake/Output: I/O last 3 completed shifts: In: 600 [P.O.:600] Out: 2450 [Urine:2450]  Intake/Output this shift:  Total I/O In: -  Out: 400 [Urine:400] Weight change:  Gen: NAD CVS: RRR Resp:CTA Abd: +BS, soft, NT/ND Ext: no edema  Recent Labs  Lab 03/10/24 1429 03/11/24 0507 03/12/24 1752 03/13/24 0440 03/14/24 0432  NA 127* 131* 135 134* 139  K 6.2* 5.3* 4.5 4.9 3.4*  CL 97* 103 100 103 103  CO2 13* 12* 19* 12* 21*  GLUCOSE 128* 100* 139* 94 101*  BUN 82* 78* 71* 66* 54*  CREATININE 6.28* 6.14* 5.60* 4.81* 3.87*  ALBUMIN 3.0*  --   --  2.4* 2.5*  CALCIUM  7.9* 7.1* 7.7* 7.5* 7.5*  PHOS  --   --   --  5.9* 4.7*  AST 14*  --   --   --   --   ALT <5  --   --   --   --    Liver Function Tests: Recent Labs  Lab 03/10/24 1429 03/13/24 0440 03/14/24 0432  AST 14*  --   --   ALT <5  --   --   ALKPHOS 210*  --   --   BILITOT 0.4  --   --   PROT 7.4  --   --   ALBUMIN 3.0* 2.4* 2.5*   No results for input(s): LIPASE, AMYLASE in the last 168 hours. No results for input(s): AMMONIA in the last 168 hours. CBC: Recent Labs  Lab 03/10/24 1429 03/11/24 0507 03/12/24 1752 03/13/24 0440 03/14/24 0432  WBC 13.1* 8.8 8.3  --  5.7  NEUTROABS 10.3*  --   --   --   --   HGB 7.9* 5.9* 9.9* 10.2* 9.1*  HCT 27.0* 20.0* 30.4* 32.6* 28.3*  MCV 104.7* 100.0 94.1  --  94.3  PLT 486* 411* 427*  --  467*   Cardiac Enzymes: Recent Labs  Lab 03/10/24 1429  CKTOTAL 69   CBG: Recent Labs  Lab 03/10/24 2231  GLUCAP 117*    Iron Studies: No  results for input(s): IRON, TIBC, TRANSFERRIN, FERRITIN in the last 72 hours. Studies/Results: No results found.  ALPRAZolam   0.5 mg Oral BID   Chlorhexidine  Gluconate Cloth  6 each Topical Daily   gabapentin   300 mg Oral TID   sodium zirconium cyclosilicate   10 g Oral Daily   ticagrelor   60 mg Oral Daily   traZODone   100 mg Oral QHS    BMET    Component Value Date/Time   NA 139 03/14/2024 0432   K 3.4 (L) 03/14/2024 0432   CL 103 03/14/2024 0432   CO2 21 (L) 03/14/2024 0432   GLUCOSE 101 (H) 03/14/2024 0432   BUN 54 (H) 03/14/2024 0432   CREATININE 3.87 (H) 03/14/2024 0432   CALCIUM  7.5 (L) 03/14/2024 0432   GFRNONAA 11 (L) 03/14/2024 0432   GFRAA >60 08/23/2018 9480  CBC    Component Value Date/Time   WBC 5.7 03/14/2024 0432   RBC 3.00 (L) 03/14/2024 0432   HGB 9.1 (L) 03/14/2024 0432   HCT 28.3 (L) 03/14/2024 0432   PLT 467 (H) 03/14/2024 0432   MCV 94.3 03/14/2024 0432   MCH 30.3 03/14/2024 0432   MCHC 32.2 03/14/2024 0432   RDW 15.3 03/14/2024 0432   LYMPHSABS 1.4 03/10/2024 1429   MONOABS 1.0 03/10/2024 1429   EOSABS 0.2 03/10/2024 1429   BASOSABS 0.1 03/10/2024 1429      Assessment/Plan: 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  Most likely due to ATN with another possible urinary tract infection.  Her scans show trabeculation and changes in her bladder which are consistent with a history of frequent urinary tract infections. Creatinine was noted to be 6.28 and during her last hospitalization her creatinine ranged from 1.3-1.96 but was 1.36 on 01/13/2024.  Creatinine on 02/01/2024 was 1.84.  Slowly improving to 3.87 this morning.    - Dr. Melia discussed the possible need for dialysis at least temporarily at the beginning of her hospitalization.  She adamantly stated that she did not want long-term or even short-term  dialysis.  Her POA was at the bedside at that time and was part of this discussion as well but the patient currently is able to make her own decisions.  She again verified that she would not want any form of RRT.  Thankfully her renal function continues to slowly improve. -changed to bicarbonate based isotonic fluids due to metabolic acidosis and is responded to fluids.  Now improving off of IVF's.     -Monitor Daily I/Os, Daily weight  -Maintain MAP>65 for optimal renal perfusion.  - Avoid nephrotoxic agents such as IV contrast, NSAIDs, and phosphate containing bowel preps (FLEETS) - Will need to follow up with our office after discharge   UTI - h/o MDR Klebsiella from prior hospitalization, urine culture sent. Hypertension -actually on the lower side and her blood pressure medicines as well as diuretic are being held appropriately. Metabolic acidosis - due to #1, resolved with IV bicarb. Rheumatoid arthritis Anemia of chronic disease - Hgb improved after blood transfusion.  Continue to follow.  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  There is some resistance on gentle retraction which suggests it is in the right place.  May not be the right size as there appears to be significant leakage.  She is followed by [facility] urology as an outpatient.   Robin RONAL Sellar, MD [facility]

## 2024-03-14 NOTE — Evaluation (Signed)
 Occupational Therapy Evaluation Patient Details Name: Robin Blackburn MRN: [id number] DOB: 31-Dec-1945 Today's Date: 03/14/2024   History of Present Illness   Robin Blackburn is a 78 y.o. female with medical history significant for rheumatoid arthritis, stroke, fibromyalgia, hypertension, chronic pain.  Patient was brought to the ED with reports of abnormal lab.  Patient reports chronic poor oral intake because she does not like the food at the nursing home, she denies vomiting or diarrhea.  She reports no abdominal pain and pain with urination over the past few days.  She reports she has not been able to void well recently, she is unsure why. (per MD)     Clinical Impressions Pt agreeable to OT and PT co-evaluation. Pt limited today by dizziness and nausea when seated at EOB. BP taken at 140/87 when pt was having symptoms at the EOB. Pt required CGA for bed mobility and lower body dressing today. B UE generally weak with worse weakness noted in R UE which is an affected area from a previous stroke. Pt was able to partially scoot to EOB while seated in bed, but ultimately was assisted to scoot to [facility] while supine. Pt left in the bed with call bell within reach. Pt will benefit from continued OT in the hospital to increase strength, balance, and endurance for safe ADL's.        If plan is discharge home, recommend the following:   A lot of help with walking and/or transfers;A little help with bathing/dressing/bathroom;Assistance with cooking/housework;Assist for transportation;Help with stairs or ramp for entrance     Functional Status Assessment   Patient has had a recent decline in their functional status and demonstrates the ability to make significant improvements in function in a reasonable and predictable amount of time.     Equipment Recommendations   None recommended by OT             Precautions/Restrictions   Precautions Precautions: Fall Recall of  Precautions/Restrictions: Intact Restrictions Weight Bearing Restrictions Per Provider Order: No     Mobility Bed Mobility Overal bed mobility: Needs Assistance Bed Mobility: Supine to Sit, Sit to Supine     Supine to sit: Contact guard, HOB elevated Sit to supine: Contact guard assist   General bed mobility comments: labored movement with HOB partially raised    Transfers                   General transfer comment: Unable to attempt due to increase in dizziness and nausea.      Balance Overall balance assessment: Needs assistance Sitting-balance support: Feet supported, No upper extremity supported Sitting balance-Leahy Scale: Good Sitting balance - Comments: seated at EOB                                   ADL either performed or assessed with clinical judgement   ADL Overall ADL's : Needs assistance/impaired     Grooming: Set up;Sitting   Upper Body Bathing: Set up;Sitting   Lower Body Bathing: Contact guard assist;Sitting/lateral leans;Minimal assistance   Upper Body Dressing : Set up;Sitting   Lower Body Dressing: Contact guard assist;Sitting/lateral leans Lower Body Dressing Details (indicate cue type and reason): Able to don sock at EOB with extended time.   Toilet Transfer Details (indicate cue type and reason): Unable to attempt due to dizziness. Toileting- Clothing Manipulation and Hygiene: Sitting/lateral lean;Bed level;Moderate assistance;Maximal assistance  Vision Baseline Vision/History: 1 Wears glasses Ability to See in Adequate Light: 1 Impaired Patient Visual Report: No change from baseline Vision Assessment?: No apparent visual deficits     Perception Perception: Not tested       Praxis Praxis: Not tested       Pertinent Vitals/Pain Pain Assessment Pain Assessment: Faces Faces Pain Scale: Hurts little more Pain Location: headache, dizziness, mild nausea; pain all over Pain Descriptors /  Indicators: Headache, Discomfort Pain Intervention(s): Monitored during session, Repositioned, Limited activity within patient's tolerance     Extremity/Trunk Assessment Upper Extremity Assessment Upper Extremity Assessment: RUE deficits/detail;Generalized weakness (L 4+/5 shoulder flexion.) RUE Deficits / Details: 4/5 shoulder flexion; hx of stroke with deficits to R side.   Lower Extremity Assessment Lower Extremity Assessment: Defer to PT evaluation   Cervical / Trunk Assessment Cervical / Trunk Assessment: Kyphotic   Communication Communication Communication: No apparent difficulties   Cognition Arousal: Alert Behavior During Therapy: WFL for tasks assessed/performed Cognition: No apparent impairments                               Following commands: Intact       Cueing  General Comments   Cueing Techniques: Verbal cues                 Home Living Family/patient expects to be discharged to:: Skilled nursing facility                                        Prior Functioning/Environment Prior Level of Function : Needs assist       Physical Assist : ADLs (physical);Mobility (physical) Mobility (physical): Stairs;Gait ADLs (physical): IADLs;Toileting Mobility Comments: Mod Indep for transferring to her wheel chair, uses w/c for mobility ADLs Comments: Pt reports independent bathing and dressing with assist for toileting. IADL assist as well.    OT Problem List: Decreased strength;Decreased activity tolerance;Impaired balance (sitting and/or standing)   OT Treatment/Interventions: Self-care/ADL training;Therapeutic exercise;DME and/or AE instruction;Patient/family education;Balance training;Therapeutic activities      OT Goals(Current goals can be found in the care plan section)   Acute Rehab OT Goals Patient Stated Goal: Return to rehab. OT Goal Formulation: With patient Time For Goal Achievement: 03/28/24 Potential to  Achieve Goals: Good   OT Frequency:  Min 3X/week    Co-evaluation PT/OT/SLP Co-Evaluation/Treatment: Yes Reason for Co-Treatment: To address functional/ADL transfers   OT goals addressed during session: ADL's and self-care                       End of Session    Activity Tolerance: Patient tolerated treatment well Patient left: in bed;with call bell/phone within reach  OT Visit Diagnosis: Unsteadiness on feet (R26.81);Other abnormalities of gait and mobility (R26.89);Muscle weakness (generalized) (M62.81)                Time: 8944-8879 OT Time Calculation (min): 25 min Charges:  OT General Charges $OT Visit: 1 Visit OT Evaluation $OT Eval Low Complexity: 1 Low  Alannah Averhart OT, MOT  Jayson Person 03/14/2024, 3:30 PM

## 2024-03-14 NOTE — Progress Notes (Signed)
 PROGRESS NOTE  Robin Blackburn FMW:[id number] DOB: 05-Aug-1945 DOA: 03/10/2024 PCP: Bertell Satterfield, MD  Brief History:  78 y.o. female with medical history significant for rheumatoid arthritis, stroke, fibromyalgia, hypertension, chronic pain. Patient was brought to the ED with reports of abnormal lab.  Patient reports chronic poor oral intake because she does not like the food at the nursing home, she denies vomiting or diarrhea.  She reports no abdominal pain and pain with urination over the past few days.  She reports she has not been able to void well recently, she was found to have acute on chronic renal failure secondary to bladder outlet obstruction.  Nephrology was consulted to assist with management.  Case was also discussed with urology.  Foley catheter was placed on 03/11/2024.  Since catheter insertion, her renal function is slowly improving.   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--probably related to obstructive uropathy see #4 below - creatinine on admission=6.28 - baseline creatinine =  1.4-1.7 - creatinine is trending down--currently 4.81>>3.87 -Nephrology consult appreciated -restart bicarb drip 10/5>>10/6 -renally adjust medications, avoid nephrotoxic agents / dehydration  / hypotension   2) acute on chronic symptomatic anemia--hemoglobin down to 5.9 from 7.9 on admission -Hgb up to 9.9 after 2 units of PRBC on 03/10/2024 - Recent baseline hemoglobin usually between 9 and 10 - No obvious bleeding---patient is on Brilinta  -Hold heparin  for DVT prophylaxis for now- use SCDs instead - Ferritin was 214, B12 was 617, folate was 19.2, serum iron was 39, iron saturation was 15 TIBC was 261 on 02/01/2024 - Check stool occult blood--neg - Defer decision of possible Procrit/Epogen to nephrology service   3) hyperkalemia and anion gap metabolic acidosis--in the setting of #1 above - restart bicarbonate drip 10/5>>1/06 - now hypokalemic>>d/c  lokelma   4)Obstructive uropathy with possible UTI - 10/2 CT renal stone protocol shows--Findings compatible with bladder outlet obstruction, with persistent bladder wall thickening and stable bilateral hydroureteronephrosis. Perivesicular Fat stranding and uroepithelial mural thickening consistent with cystitis and urinary tract infection.  - Curbside conversation with Dr. Sherrilee from urology service-recommends Foley catheter insertion - Foley placed 03/11/2024 per urologist recommendation -urine culture 01/04/2024 grew multidrug-resistant Klebsiella -  - Repeat urine culture from 03/10/2024 NGTD -Okay to stop meropenem  (10/2>>10/4) - plan to d/c to SNF with foley and follow up with urology outpt  5) Dysarthria -03/14/24--pt complains of feeling thick tongued x 2-3 days, but did not mention to anyone till 10/6 -appears functional as the deficit fluctuates and there are no other focal deficits -MR brain   6)Hypertension -Blood pressure soft initially - Hold torsemide  20 mg daily, hold metoprolol  and Norvasc     7)Rheumatoid arthritis- stable, on Arava  and Humira - Hold in the setting of concerns for possible acute infection   8)Social/Ethics--- as per patient her friend Mrs Dozier Jenny is a primary contact - Patient is a full code                 Family Communication:   no Family at bedside--as per patient her friend Mrs Dozier Jenny is a primary contact    Consultants:  renal, urology   Code Status:  FULL    DVT Prophylaxis:  SCDs     Procedures: As Listed in Progress Note Above   Antibiotics: Merrem  10/2>>10/4             Subjective: Pt feels thick tongued.  Denies cp, sob, abd pain, f/c.  Had  BM today.    Objective: Vitals:   03/13/24 1220 03/13/24 1958 03/14/24 0412 03/14/24 1346  BP: (!) 126/94 (!) 140/62 (!) 146/64 136/77  Pulse: 79 75 79 83  Resp: 16 16 16 16   Temp: 98.1 F (36.7 C) (!) 97.5 F (36.4 C) 98.6 F (37 C) 98 F  (36.7 C)  TempSrc: Axillary Oral Oral Oral  SpO2: 95% 100% 95% 98%  Weight:      Height:        Intake/Output Summary (Last 24 hours) at 03/14/2024 1752 Last data filed at 03/14/2024 1006 Gross per 24 hour  Intake 600 ml  Output 2300 ml  Net -1700 ml   Weight change:  Exam:  General:  Pt is alert, follows commands appropriately, not in acute distress HEENT: No icterus, No thrush, No neck mass, [place]/AT Cardiovascular: RRR, S1/S2, no rubs, no gallops Respiratory: fine bibasilar crackles. No wheeze Abdomen: Soft/+BS, non tender, non distended, no guarding Extremities: No edema, No lymphangitis, No petechiae, No rashes, no synovitis Neuro:  CN II-XII intact, strength 4/5 in RUE, RLE, strength 4/5 LUE, LLE; sensation intact bilateral; no dysmetria; babinski equivocal    Data Reviewed: I have personally reviewed following labs and imaging studies Basic Metabolic Panel: Recent Labs  Lab 03/10/24 1429 03/11/24 0507 03/12/24 1752 03/13/24 0440 03/14/24 0432  NA 127* 131* 135 134* 139  K 6.2* 5.3* 4.5 4.9 3.4*  CL 97* 103 100 103 103  CO2 13* 12* 19* 12* 21*  GLUCOSE 128* 100* 139* 94 101*  BUN 82* 78* 71* 66* 54*  CREATININE 6.28* 6.14* 5.60* 4.81* 3.87*  CALCIUM  7.9* 7.1* 7.7* 7.5* 7.5*  PHOS  --   --   --  5.9* 4.7*   Liver Function Tests: Recent Labs  Lab 03/10/24 1429 03/13/24 0440 03/14/24 0432  AST 14*  --   --   ALT <5  --   --   ALKPHOS 210*  --   --   BILITOT 0.4  --   --   PROT 7.4  --   --   ALBUMIN 3.0* 2.4* 2.5*   No results for input(s): LIPASE, AMYLASE in the last 168 hours. No results for input(s): AMMONIA in the last 168 hours. Coagulation Profile: No results for input(s): INR, PROTIME in the last 168 hours. CBC: Recent Labs  Lab 03/10/24 1429 03/11/24 0507 03/12/24 1752 03/13/24 0440 03/14/24 0432  WBC 13.1* 8.8 8.3  --  5.7  NEUTROABS 10.3*  --   --   --   --   HGB 7.9* 5.9* 9.9* 10.2* 9.1*  HCT 27.0* 20.0* 30.4* 32.6* 28.3*   MCV 104.7* 100.0 94.1  --  94.3  PLT 486* 411* 427*  --  467*   Cardiac Enzymes: Recent Labs  Lab 03/10/24 1429  CKTOTAL 69   BNP: Invalid input(s): POCBNP CBG: Recent Labs  Lab 03/10/24 2231  GLUCAP 117*   HbA1C: No results for input(s): HGBA1C in the last 72 hours. Urine analysis:    Component Value Date/Time   COLORURINE AMBER (A) 03/10/2024 2030   APPEARANCEUR TURBID (A) 03/10/2024 2030   APPEARANCEUR Cloudy (A) 02/10/2023 1454   LABSPEC 1.010 03/10/2024 2030   PHURINE 5.0 03/10/2024 2030   GLUCOSEU NEGATIVE 03/10/2024 2030   HGBUR NEGATIVE 03/10/2024 2030   BILIRUBINUR NEGATIVE 03/10/2024 2030   BILIRUBINUR Negative 02/10/2023 1454   KETONESUR NEGATIVE 03/10/2024 2030   PROTEINUR 100 (A) 03/10/2024 2030   UROBILINOGEN 0.2 04/10/2015 2212   NITRITE NEGATIVE 03/10/2024  2030   LEUKOCYTESUR LARGE (A) 03/10/2024 2030   Sepsis Labs: @LABRCNTIP (procalcitonin:4,lacticidven:4) ) Recent Results (from the past 240 hours)  Resp panel by RT-PCR (RSV, Flu A&B, Covid) Anterior Nasal Swab     Status: None   Collection Time: 03/10/24  2:48 PM   Specimen: Anterior Nasal Swab  Result Value Ref Range Status   SARS Coronavirus 2 by RT PCR NEGATIVE NEGATIVE Final    Comment: (NOTE) SARS-CoV-2 target nucleic acids are NOT DETECTED.  The SARS-CoV-2 RNA is generally detectable in upper respiratory specimens during the acute phase of infection. The lowest concentration of SARS-CoV-2 viral copies this assay can detect is 138 copies/mL. A negative result does not preclude SARS-Cov-2 infection and should not be used as the sole basis for treatment or other patient management decisions. A negative result may occur with  improper specimen collection/handling, submission of specimen other than nasopharyngeal swab, presence of viral mutation(s) within the areas targeted by this assay, and inadequate number of viral copies(<138 copies/mL). A negative result must be combined  with clinical observations, patient history, and epidemiological information. The expected result is Negative.  Fact Sheet for Patients:  BloggerCourse.com  Fact Sheet for Healthcare Providers:  SeriousBroker.it  This test is no t yet approved or cleared by the [place]  FDA and  has been authorized for detection and/or diagnosis of SARS-CoV-2 by FDA under an Emergency Use Authorization (EUA). This EUA will remain  in effect (meaning this test can be used) for the duration of the COVID-19 declaration under Section 564(b)(1) of the Act, 21 U.S.C.section 360bbb-3(b)(1), unless the authorization is terminated  or revoked sooner.       Influenza A by PCR NEGATIVE NEGATIVE Final   Influenza B by PCR NEGATIVE NEGATIVE Final    Comment: (NOTE) The Xpert Xpress SARS-CoV-2/FLU/RSV plus assay is intended as an aid in the diagnosis of influenza from Nasopharyngeal swab specimens and should not be used as a sole basis for treatment. Nasal washings and aspirates are unacceptable for Xpert Xpress SARS-CoV-2/FLU/RSV testing.  Fact Sheet for Patients: BloggerCourse.com  Fact Sheet for Healthcare Providers: SeriousBroker.it  This test is not yet approved or cleared by the [place]  FDA and has been authorized for detection and/or diagnosis of SARS-CoV-2 by FDA under an Emergency Use Authorization (EUA). This EUA will remain in effect (meaning this test can be used) for the duration of the COVID-19 declaration under Section 564(b)(1) of the Act, 21 U.S.C. section 360bbb-3(b)(1), unless the authorization is terminated or revoked.     Resp Syncytial Virus by PCR NEGATIVE NEGATIVE Final    Comment: (NOTE) Fact Sheet for Patients: BloggerCourse.com  Fact Sheet for Healthcare Providers: SeriousBroker.it  This test is not yet approved  or cleared by the [place]  FDA and has been authorized for detection and/or diagnosis of SARS-CoV-2 by FDA under an Emergency Use Authorization (EUA). This EUA will remain in effect (meaning this test can be used) for the duration of the COVID-19 declaration under Section 564(b)(1) of the Act, 21 U.S.C. section 360bbb-3(b)(1), unless the authorization is terminated or revoked.  Performed at [facility], [address] [place], [place] [address]   Urine Culture     Status: None   Collection Time: 03/10/24  8:30 PM   Specimen: Urine, Clean Catch  Result Value Ref Range Status   Specimen Description   Final    URINE, CLEAN CATCH Performed at [facility], [address] [place], [place] [address]    Special Requests  Final    NONE Performed at [facility], [address], [place], [place] [address]    Culture   Final    NO GROWTH Performed at [facility], [address], [place], [place] [address]    Report Status 03/12/2024 FINAL  Final  MRSA Next Gen by PCR, Nasal     Status: Abnormal   Collection Time: 03/10/24  8:57 PM   Specimen: Nasal Mucosa; Nasal Swab  Result Value Ref Range Status   MRSA by PCR Next Gen DETECTED (A) NOT DETECTED Final    Comment: RESULT CALLED TO, READ BACK BY AND VERIFIED WITH: M MARSH,RN@2353  03/10/24 MK (NOTE) The GeneXpert MRSA Assay (FDA approved for NASAL specimens only), is one component of a comprehensive MRSA colonization surveillance program. It is not intended to diagnose MRSA infection nor to guide or monitor treatment for MRSA infections. Test performance is not FDA approved in patients less than 3 years old. Performed at [facility], [address], [place], [place] [address]      Scheduled Meds:  ALPRAZolam   0.5 mg Oral BID   Chlorhexidine  Gluconate Cloth  6 each Topical Daily   gabapentin   300 mg Oral TID   mupirocin ointment  1 Application Nasal BID   ticagrelor   60 mg Oral Daily   traZODone   100 mg Oral QHS    Continuous Infusions:  Procedures/Studies: US  Renal Result Date: 03/10/2024 EXAM: US  Retroperitoneum Complete, Renal. CLINICAL HISTORY: acute kidney failure. TECHNIQUE: Real-time ultrasound of the retroperitoneum (complete) with image documentation. COMPARISON: CT dated 03/10/2024. FINDINGS: RIGHT KIDNEY: Right kidney measures 10.2 x 5.5 x 5.4 cm. Volume is 161 cc. Moderate hydroureteronephrosis, as on CT. No renal stone or mass visualized. LEFT KIDNEY: Left kidney measures 10.6 x 5.9 x 4.6 cm. Volume is 154 cc. Moderate hydroureteronephrosis, as on CT. No renal stone or mass visualized. BLADDER: Mild bladder wall thickening. Prevoid Bladder Volume of 42 cc. IMPRESSION: 1. Moderate bilateral hydroureteronephrosis. 2. Mild bladder wall thickening, likely due to bladder outlet obstruction. Electronically signed by: Rockey Kilts MD 03/10/2024 05:17 PM EDT RP Workstation: [id number]   CT Renal Stone Study Result Date: 03/10/2024 EXAM: CT UROGRAM 03/10/2024 03:58:52 PM TECHNIQUE: CT of the abdomen and pelvis was performed without the administration of intravenous contrast. Multiplanar reformatted images as well as MIP urogram images are provided for review. Automated exposure control, iterative reconstruction, and/or weight based adjustment of the mA/kV was utilized to reduce the radiation dose to as low as reasonably achievable. COMPARISON: 01/07/2024 CLINICAL HISTORY: acute kidney failure. Pt brb EMS from [facility] for Abnormal Labs. All V/S WDL. No other complaints at this time. FINDINGS: LOWER CHEST: Small bilateral pleural effusions, right greater than left. Minimal dependent lower lobe atelectasis. LIVER: The liver is unremarkable. GALLBLADDER AND BILE DUCTS: Gallbladder is unremarkable. No biliary ductal dilatation. SPLEEN: No acute abnormality. PANCREAS: No acute abnormality. ADRENAL GLANDS: No acute abnormality. KIDNEYS, URETERS AND BLADDER: There is continued bilateral hydroureteronephrosis  unchanged since prior exam. Stable bilateral mucosal thickening throughout the renal pelvis and bilateral ureters. Persistent bladder wall thickening with multiple bladder diverticula, consistent with sequela of bladder outlet obstruction. Perivesicular fat stranding could reflect superimposed cystitis. No stones in the kidneys or ureters. No perinephric or periureteral stranding. GI AND BOWEL: Stomach demonstrates no acute abnormality. Sigmoid diverticulosis without evidence of acute diverticulitis. Appendix is surgically absent. There is no bowel obstruction. PERITONEUM AND RETROPERITONEUM: No ascites. No free air. VASCULATURE: Aorta is normal in caliber. Aortic atherosclerosis. LYMPH NODES:  No lymphadenopathy. REPRODUCTIVE ORGANS: No acute abnormality. BONES AND SOFT TISSUES: No acute osseous abnormality. No focal soft tissue abnormality. IMPRESSION: 1. Findings compatible with bladder outlet obstruction, with persistent bladder wall thickening and stable bilateral hydroureteronephrosis. Perivesicular Fat stranding and uroepithelial mural thickening consistent with cystitis and urinary tract infection. Please correlate with urinalysis. 2. No urinary tract calculi. 3. Sigmoid diverticulosis without diverticulitis. Electronically signed by: Ozell Daring MD 03/10/2024 04:05 PM EDT RP Workstation: [id number]   DG Chest 2 View Result Date: 02/23/2024 EXAM: 2 VIEW(S) XRAY OF THE CHEST 02/18/2024 02:27:39 PM COMPARISON: 01/13/2024 CLINICAL HISTORY: Pneumonia. FINDINGS: LINES, TUBES AND DEVICES: Stable right PICC line with tip in mid SVC. LUNGS AND PLEURA: Left basilar scarring. No focal pulmonary opacity. No pulmonary edema. No pleural effusion. No pneumothorax. HEART AND MEDIASTINUM: No acute abnormality of the cardiac and mediastinal silhouettes. Aortic atherosclerotic calcification. BONES AND SOFT TISSUES: No acute osseous abnormality. IMPRESSION: 1. No acute findings. 2. Stable right PICC line with tip in mid  SVC. Electronically signed by: Katheleen Faes MD 02/23/2024 01:32 PM EDT RP Workstation: [id number]    Alm Schneider, DO  [facility]  If 7PM-7AM, please contact night-coverage [email or web address] Password [email or web address] 03/14/2024, 5:52 PM   LOS: 4 days

## 2024-03-14 NOTE — Plan of Care (Signed)
  Problem: Acute Rehab PT Goals(only PT should resolve) Goal: Pt Will Go Supine/Side To Sit Outcome: Progressing Flowsheets (Taken 03/14/2024 1509) Pt will go Supine/Side to Sit:  with contact guard assist  with minimal assist Goal: Patient Will Transfer Sit To/From Stand Outcome: Progressing Flowsheets (Taken 03/14/2024 1509) Patient will transfer sit to/from stand: with minimal assist Goal: Pt Will Transfer Bed To Chair/Chair To Bed Outcome: Progressing Flowsheets (Taken 03/14/2024 1509) Pt will Transfer Bed to Chair/Chair to Bed: with min assist Goal: Pt Will Perform Standing Balance Or Pre-Gait Outcome: Progressing Flowsheets (Taken 03/14/2024 1509) Pt will perform standing balance or pre-gait: with minimal assist   3:10 PM, 03/14/24 Lynwood Music, MPT Physical Therapist with [facility] [phone] office [phone] mobile phone

## 2024-03-14 NOTE — Plan of Care (Signed)
  Problem: Acute Rehab OT Goals (only OT should resolve) Goal: Pt. Will Perform Grooming Flowsheets (Taken 03/14/2024 1533) Pt Will Perform Grooming:  with modified independence  sitting Goal: Pt. Will Perform Lower Body Dressing Flowsheets (Taken 03/14/2024 1533) Pt Will Perform Lower Body Dressing:  with modified independence  sitting/lateral leans Goal: Pt. Will Transfer To Toilet Flowsheets (Taken 03/14/2024 1533) Pt Will Transfer to Toilet:  with modified independence  anterior/posterior transfer Goal: Pt. Will Perform Toileting-Clothing Manipulation Flowsheets (Taken 03/14/2024 1533) Pt Will Perform Toileting - Clothing Manipulation and hygiene:  with set-up  bed level Goal: Pt/Caregiver Will Perform Home Exercise Program Flowsheets (Taken 03/14/2024 1533) Pt/caregiver will Perform Home Exercise Program:  Increased strength  Both right and left upper extremity  Independently  Geraldine Tesar OT, MOT

## 2024-03-14 NOTE — Evaluation (Signed)
 Physical Therapy Evaluation Patient Details Name: Robin Blackburn MRN: [id number] DOB: 1945-10-26 Today's Date: 03/14/2024  History of Present Illness  Robin Blackburn is a 78 y.o. female with medical history significant for rheumatoid arthritis, stroke, fibromyalgia, hypertension, chronic pain.  Patient was brought to the ED with reports of abnormal lab.  Patient reports chronic poor oral intake because she does not like the food at the nursing home, she denies vomiting or diarrhea.  She reports no abdominal pain and pain with urination over the past few days.  She reports she has not been able to void well recently, she is unsure why.   Clinical Impression  Patient demonstrates slightly labored movement for sitting up at bedside with [facility] partially raised, once seated able to maintain sitting balance, but unable to attempt transferring to chair due to c/o headache, dizziness and mild nausea, BP found to be at 140/87. Patient put back to bed and nursing staff notified. Patient will benefit from continued skilled physical therapy in hospital and recommended venue below to increase strength, balance, endurance for safe ADLs and gait.         If plan is discharge home, recommend the following: A lot of help with walking and/or transfers;Assistance with cooking/housework;Help with stairs or ramp for entrance;Assist for transportation;A lot of help with bathing/dressing/bathroom   Can travel by private vehicle   No    Equipment Recommendations None recommended by PT  Recommendations for Other Services       Functional Status Assessment Patient has had a recent decline in their functional status and demonstrates the ability to make significant improvements in function in a reasonable and predictable amount of time.     Precautions / Restrictions Precautions Precautions: Fall Recall of Precautions/Restrictions: Intact Restrictions Weight Bearing Restrictions Per Provider Order: No       Mobility  Bed Mobility Overal bed mobility: Needs Assistance Bed Mobility: Supine to Sit     Supine to sit: Contact guard, HOB elevated     General bed mobility comments: labored movement with HOB partially raised    Transfers                        Ambulation/Gait                  Stairs            Wheelchair Mobility     Tilt Bed    Modified Rankin (Stroke Patients Only)       Balance Overall balance assessment: Needs assistance Sitting-balance support: Feet supported, No upper extremity supported Sitting balance-Leahy Scale: Good Sitting balance - Comments: seated at EOB                                     Pertinent Vitals/Pain Pain Assessment Pain Assessment: Faces Faces Pain Scale: Hurts little more Pain Location: headache, dizziness, mild nausea Pain Descriptors / Indicators: Headache    Home Living Family/patient expects to be discharged to:: Skilled nursing facility                        Prior Function Prior Level of Function : Needs assist       Physical Assist : Mobility (physical);ADLs (physical) Mobility (physical): Bed mobility;Transfers;Gait;Stairs   Mobility Comments: Mod Indep for transferring to her wheel chair, uses w/c for mobility ADLs Comments: Assisted  by SNF staff     Extremity/Trunk Assessment   Upper Extremity Assessment Upper Extremity Assessment: Defer to OT evaluation    Lower Extremity Assessment Lower Extremity Assessment: Generalized weakness    Cervical / Trunk Assessment Cervical / Trunk Assessment: Kyphotic  Communication   Communication Communication: No apparent difficulties    Cognition Arousal: Alert Behavior During Therapy: WFL for tasks assessed/performed   PT - Cognitive impairments: No apparent impairments                         Following commands: Intact       Cueing Cueing Techniques: Verbal cues     General Comments       Exercises     Assessment/Plan    PT Assessment Patient needs continued PT services  PT Problem List Decreased strength;Decreased safety awareness;Decreased balance;Decreased mobility       PT Treatment Interventions DME instruction;Functional mobility training;Therapeutic activities;Therapeutic exercise;Wheelchair mobility training;Patient/family education    PT Goals (Current goals can be found in the Care Plan section)  Acute Rehab PT Goals Patient Stated Goal: return home able to transfer to her w/c PT Goal Formulation: With patient Time For Goal Achievement: 03/28/24 Potential to Achieve Goals: Good    Frequency Min 3X/week     Co-evaluation               AM-PAC PT 6 Clicks Mobility  Outcome Measure Help needed turning from your back to your side while in a flat bed without using bedrails?: A Little Help needed moving from lying on your back to sitting on the side of a flat bed without using bedrails?: A Little Help needed moving to and from a bed to a chair (including a wheelchair)?: A Lot Help needed standing up from a chair using your arms (e.g., wheelchair or bedside chair)?: A Lot Help needed to walk in hospital room?: A Lot Help needed climbing 3-5 steps with a railing? : Total 6 Click Score: 13    End of Session   Activity Tolerance: Patient tolerated treatment well;Patient limited by fatigue;Patient limited by pain Patient left: in bed;with call bell/phone within reach Nurse Communication: Mobility status PT Visit Diagnosis: Unsteadiness on feet (R26.81);Other abnormalities of gait and mobility (R26.89);Other symptoms and signs involving the nervous system (R29.898)    Time: 8949-8880 PT Time Calculation (min) (ACUTE ONLY): 29 min   Charges:   PT Evaluation $PT Eval Moderate Complexity: 1 Mod PT Treatments $Therapeutic Activity: 23-37 mins PT General Charges $$ ACUTE PT VISIT: 1 Visit         3:08 PM, 03/14/24 Lynwood Music,  MPT Physical Therapist with [facility] [phone] office [phone] mobile phone

## 2024-03-15 DIAGNOSIS — D631 Anemia in chronic kidney disease: Secondary | ICD-10-CM | POA: Diagnosis not present

## 2024-03-15 DIAGNOSIS — D638 Anemia in other chronic diseases classified elsewhere: Secondary | ICD-10-CM | POA: Diagnosis not present

## 2024-03-15 DIAGNOSIS — M6281 Muscle weakness (generalized): Secondary | ICD-10-CM | POA: Diagnosis not present

## 2024-03-15 DIAGNOSIS — R531 Weakness: Secondary | ICD-10-CM | POA: Diagnosis not present

## 2024-03-15 DIAGNOSIS — E669 Obesity, unspecified: Secondary | ICD-10-CM | POA: Diagnosis present

## 2024-03-15 DIAGNOSIS — Z8673 Personal history of transient ischemic attack (TIA), and cerebral infarction without residual deficits: Secondary | ICD-10-CM | POA: Diagnosis not present

## 2024-03-15 DIAGNOSIS — N1832 Chronic kidney disease, stage 3b: Secondary | ICD-10-CM | POA: Diagnosis not present

## 2024-03-15 DIAGNOSIS — E869 Volume depletion, unspecified: Secondary | ICD-10-CM | POA: Diagnosis present

## 2024-03-15 DIAGNOSIS — J90 Pleural effusion, not elsewhere classified: Secondary | ICD-10-CM | POA: Diagnosis present

## 2024-03-15 DIAGNOSIS — R4589 Other symptoms and signs involving emotional state: Secondary | ICD-10-CM | POA: Diagnosis not present

## 2024-03-15 DIAGNOSIS — B9620 Unspecified Escherichia coli [E. coli] as the cause of diseases classified elsewhere: Secondary | ICD-10-CM | POA: Diagnosis present

## 2024-03-15 DIAGNOSIS — F32A Depression, unspecified: Secondary | ICD-10-CM | POA: Diagnosis present

## 2024-03-15 DIAGNOSIS — N3289 Other specified disorders of bladder: Secondary | ICD-10-CM | POA: Diagnosis not present

## 2024-03-15 DIAGNOSIS — E876 Hypokalemia: Secondary | ICD-10-CM | POA: Diagnosis present

## 2024-03-15 DIAGNOSIS — R112 Nausea with vomiting, unspecified: Secondary | ICD-10-CM | POA: Diagnosis not present

## 2024-03-15 DIAGNOSIS — F13232 Sedative, hypnotic or anxiolytic dependence with withdrawal with perceptual disturbance: Secondary | ICD-10-CM | POA: Diagnosis not present

## 2024-03-15 DIAGNOSIS — Z515 Encounter for palliative care: Secondary | ICD-10-CM | POA: Diagnosis not present

## 2024-03-15 DIAGNOSIS — E871 Hypo-osmolality and hyponatremia: Secondary | ICD-10-CM | POA: Diagnosis not present

## 2024-03-15 DIAGNOSIS — N139 Obstructive and reflux uropathy, unspecified: Secondary | ICD-10-CM | POA: Diagnosis not present

## 2024-03-15 DIAGNOSIS — N320 Bladder-neck obstruction: Secondary | ICD-10-CM | POA: Diagnosis not present

## 2024-03-15 DIAGNOSIS — N170 Acute kidney failure with tubular necrosis: Secondary | ICD-10-CM | POA: Diagnosis not present

## 2024-03-15 DIAGNOSIS — K589 Irritable bowel syndrome without diarrhea: Secondary | ICD-10-CM | POA: Diagnosis not present

## 2024-03-15 DIAGNOSIS — E8809 Other disorders of plasma-protein metabolism, not elsewhere classified: Secondary | ICD-10-CM | POA: Diagnosis present

## 2024-03-15 DIAGNOSIS — N136 Pyonephrosis: Secondary | ICD-10-CM | POA: Diagnosis present

## 2024-03-15 DIAGNOSIS — I10 Essential (primary) hypertension: Secondary | ICD-10-CM | POA: Diagnosis not present

## 2024-03-15 DIAGNOSIS — N3000 Acute cystitis without hematuria: Secondary | ICD-10-CM | POA: Diagnosis not present

## 2024-03-15 DIAGNOSIS — R29898 Other symptoms and signs involving the musculoskeletal system: Secondary | ICD-10-CM | POA: Diagnosis not present

## 2024-03-15 DIAGNOSIS — K5730 Diverticulosis of large intestine without perforation or abscess without bleeding: Secondary | ICD-10-CM | POA: Diagnosis not present

## 2024-03-15 DIAGNOSIS — T83511A Infection and inflammatory reaction due to indwelling urethral catheter, initial encounter: Secondary | ICD-10-CM | POA: Diagnosis present

## 2024-03-15 DIAGNOSIS — N183 Chronic kidney disease, stage 3 unspecified: Secondary | ICD-10-CM | POA: Diagnosis not present

## 2024-03-15 DIAGNOSIS — N189 Chronic kidney disease, unspecified: Secondary | ICD-10-CM | POA: Diagnosis not present

## 2024-03-15 DIAGNOSIS — M069 Rheumatoid arthritis, unspecified: Secondary | ICD-10-CM | POA: Diagnosis present

## 2024-03-15 DIAGNOSIS — R7889 Finding of other specified substances, not normally found in blood: Secondary | ICD-10-CM | POA: Diagnosis not present

## 2024-03-15 DIAGNOSIS — N179 Acute kidney failure, unspecified: Secondary | ICD-10-CM | POA: Diagnosis present

## 2024-03-15 DIAGNOSIS — F419 Anxiety disorder, unspecified: Secondary | ICD-10-CM | POA: Diagnosis not present

## 2024-03-15 DIAGNOSIS — M175 Other unilateral secondary osteoarthritis of knee: Secondary | ICD-10-CM | POA: Diagnosis not present

## 2024-03-15 DIAGNOSIS — N390 Urinary tract infection, site not specified: Secondary | ICD-10-CM | POA: Diagnosis not present

## 2024-03-15 DIAGNOSIS — Y846 Urinary catheterization as the cause of abnormal reaction of the patient, or of later complication, without mention of misadventure at the time of the procedure: Secondary | ICD-10-CM | POA: Diagnosis present

## 2024-03-15 DIAGNOSIS — N1330 Unspecified hydronephrosis: Secondary | ICD-10-CM | POA: Diagnosis not present

## 2024-03-15 DIAGNOSIS — Z7902 Long term (current) use of antithrombotics/antiplatelets: Secondary | ICD-10-CM | POA: Diagnosis not present

## 2024-03-15 DIAGNOSIS — E872 Acidosis, unspecified: Secondary | ICD-10-CM | POA: Diagnosis present

## 2024-03-15 DIAGNOSIS — I129 Hypertensive chronic kidney disease with stage 1 through stage 4 chronic kidney disease, or unspecified chronic kidney disease: Secondary | ICD-10-CM | POA: Diagnosis not present

## 2024-03-15 DIAGNOSIS — R109 Unspecified abdominal pain: Secondary | ICD-10-CM | POA: Diagnosis not present

## 2024-03-15 DIAGNOSIS — Z7401 Bed confinement status: Secondary | ICD-10-CM | POA: Diagnosis not present

## 2024-03-15 DIAGNOSIS — Z7189 Other specified counseling: Secondary | ICD-10-CM | POA: Diagnosis not present

## 2024-03-15 DIAGNOSIS — B961 Klebsiella pneumoniae [K. pneumoniae] as the cause of diseases classified elsewhere: Secondary | ICD-10-CM | POA: Diagnosis present

## 2024-03-15 DIAGNOSIS — I700 Atherosclerosis of aorta: Secondary | ICD-10-CM | POA: Diagnosis present

## 2024-03-15 DIAGNOSIS — E440 Moderate protein-calorie malnutrition: Secondary | ICD-10-CM | POA: Diagnosis present

## 2024-03-15 DIAGNOSIS — M797 Fibromyalgia: Secondary | ICD-10-CM | POA: Diagnosis present

## 2024-03-15 DIAGNOSIS — K219 Gastro-esophageal reflux disease without esophagitis: Secondary | ICD-10-CM | POA: Diagnosis present

## 2024-03-15 DIAGNOSIS — B3749 Other urogenital candidiasis: Secondary | ICD-10-CM | POA: Diagnosis present

## 2024-03-15 DIAGNOSIS — D539 Nutritional anemia, unspecified: Secondary | ICD-10-CM | POA: Diagnosis present

## 2024-03-15 DIAGNOSIS — N185 Chronic kidney disease, stage 5: Secondary | ICD-10-CM | POA: Diagnosis present

## 2024-03-15 DIAGNOSIS — Z1612 Extended spectrum beta lactamase (ESBL) resistance: Secondary | ICD-10-CM | POA: Diagnosis present

## 2024-03-15 DIAGNOSIS — E46 Unspecified protein-calorie malnutrition: Secondary | ICD-10-CM | POA: Diagnosis not present

## 2024-03-15 DIAGNOSIS — D649 Anemia, unspecified: Secondary | ICD-10-CM | POA: Diagnosis not present

## 2024-03-15 DIAGNOSIS — I120 Hypertensive chronic kidney disease with stage 5 chronic kidney disease or end stage renal disease: Secondary | ICD-10-CM | POA: Diagnosis present

## 2024-03-15 LAB — CBC
HCT: 28.5 % — ABNORMAL LOW (ref 36.0–46.0)
Hemoglobin: 8.9 g/dL — ABNORMAL LOW (ref 12.0–15.0)
MCH: 30 pg (ref 26.0–34.0)
MCHC: 31.2 g/dL (ref 30.0–36.0)
MCV: 96 fL (ref 80.0–100.0)
Platelets: 474 K/uL — ABNORMAL HIGH (ref 150–400)
RBC: 2.97 MIL/uL — ABNORMAL LOW (ref 3.87–5.11)
RDW: 15.1 % (ref 11.5–15.5)
WBC: 6.8 K/uL (ref 4.0–10.5)
nRBC: 0 % (ref 0.0–0.2)

## 2024-03-15 LAB — RENAL FUNCTION PANEL
Albumin: 2.5 g/dL — ABNORMAL LOW (ref 3.5–5.0)
Anion gap: 12 (ref 5–15)
BUN: 45 mg/dL — ABNORMAL HIGH (ref 8–23)
CO2: 24 mmol/L (ref 22–32)
Calcium: 7.8 mg/dL — ABNORMAL LOW (ref 8.9–10.3)
Chloride: 105 mmol/L (ref 98–111)
Creatinine, Ser: 3.25 mg/dL — ABNORMAL HIGH (ref 0.44–1.00)
GFR, Estimated: 14 mL/min — ABNORMAL LOW (ref >60)
Glucose, Bld: 89 mg/dL (ref 70–99)
Phosphorus: 5 mg/dL — ABNORMAL HIGH (ref 2.5–4.6)
Potassium: 3.4 mmol/L — ABNORMAL LOW (ref 3.5–5.1)
Sodium: 141 mmol/L (ref 135–145)

## 2024-03-15 LAB — BASIC METABOLIC PANEL WITH GFR
Anion gap: 12 (ref 5–15)
BUN: 45 mg/dL — ABNORMAL HIGH (ref 8–23)
CO2: 24 mmol/L (ref 22–32)
Calcium: 7.8 mg/dL — ABNORMAL LOW (ref 8.9–10.3)
Chloride: 105 mmol/L (ref 98–111)
Creatinine, Ser: 3.23 mg/dL — ABNORMAL HIGH (ref 0.44–1.00)
GFR, Estimated: 14 mL/min — ABNORMAL LOW (ref >60)
Glucose, Bld: 89 mg/dL (ref 70–99)
Potassium: 3.3 mmol/L — ABNORMAL LOW (ref 3.5–5.1)
Sodium: 141 mmol/L (ref 135–145)

## 2024-03-15 LAB — MAGNESIUM: Magnesium: 2 mg/dL (ref 1.7–2.4)

## 2024-03-15 MED ORDER — ALPRAZOLAM 0.5 MG PO TABS: 0.5000 mg | ORAL_TABLET | Freq: Two times a day (BID) | ORAL | 0 refills | Status: DC | PRN

## 2024-03-15 MED ORDER — POLYETHYLENE GLYCOL 3350 17 G PO PACK: 17.0000 g | PACK | Freq: Every day | ORAL | Status: DC | PRN

## 2024-03-15 MED ORDER — POTASSIUM CHLORIDE CRYS ER 20 MEQ PO TBCR
20.0000 meq | EXTENDED_RELEASE_TABLET | Freq: Once | ORAL | Status: AC
  Administered 2024-03-15: 20 meq via ORAL
  Filled 2024-03-15: qty 1

## 2024-03-15 MED ORDER — TRAMADOL HCL 50 MG PO TABS: 50.0000 mg | ORAL_TABLET | Freq: Three times a day (TID) | ORAL | 0 refills | Status: DC | PRN

## 2024-03-15 MED ORDER — AMLODIPINE BESYLATE 5 MG PO TABS
2.5000 mg | ORAL_TABLET | Freq: Every day | ORAL | Status: DC
  Administered 2024-03-15: 2.5 mg via ORAL
  Filled 2024-03-15: qty 1

## 2024-03-15 MED ORDER — SENNA 8.6 MG PO TABS
1.0000 | ORAL_TABLET | Freq: Two times a day (BID) | ORAL | Status: DC
  Administered 2024-03-15: 8.6 mg via ORAL
  Filled 2024-03-15: qty 1

## 2024-03-15 MED ORDER — MAGIC MOUTHWASH W/LIDOCAINE
5.0000 mL | Freq: Three times a day (TID) | ORAL | Status: DC | PRN
  Filled 2024-03-15: qty 5

## 2024-03-15 MED ORDER — METOPROLOL TARTRATE 5 MG/5ML IV SOLN
5.0000 mg | INTRAVENOUS | Status: DC | PRN
Start: 2024-03-15 — End: 2024-03-15

## 2024-03-15 MED ORDER — IPRATROPIUM-ALBUTEROL 0.5-2.5 (3) MG/3ML IN SOLN: 3.0000 mL | RESPIRATORY_TRACT | Status: DC | PRN

## 2024-03-15 MED ORDER — FERROUS SULFATE 325 (65 FE) MG PO TABS: 325.0000 mg | ORAL_TABLET | Freq: Every day | ORAL | Status: DC

## 2024-03-15 MED ORDER — MAGIC MOUTHWASH
5.0000 mL | Freq: Three times a day (TID) | ORAL | Status: DC | PRN
  Administered 2024-03-15: 5 mL via ORAL
  Filled 2024-03-15 (×2): qty 5

## 2024-03-15 MED ORDER — HYDRALAZINE HCL 20 MG/ML IJ SOLN: 10.0000 mg | INTRAMUSCULAR | Status: DC | PRN

## 2024-03-15 MED ORDER — METHOCARBAMOL 500 MG PO TABS: 500.0000 mg | ORAL_TABLET | Freq: Three times a day (TID) | ORAL | 0 refills | Status: DC | PRN

## 2024-03-15 MED ORDER — FERROUS SULFATE 325 (65 FE) MG PO TBEC: 325.0000 mg | DELAYED_RELEASE_TABLET | Freq: Every day | ORAL | Status: DC

## 2024-03-15 MED ORDER — FOLIC ACID 1 MG PO TABS
1.0000 mg | ORAL_TABLET | Freq: Every day | ORAL | Status: DC
  Administered 2024-03-15: 1 mg via ORAL
  Filled 2024-03-15: qty 1

## 2024-03-15 MED ORDER — VITAMIN B-12 100 MCG PO TABS
250.0000 ug | ORAL_TABLET | Freq: Every day | ORAL | Status: DC
  Administered 2024-03-15: 250 ug via ORAL
  Filled 2024-03-15: qty 3

## 2024-03-15 MED ORDER — METOPROLOL TARTRATE 25 MG PO TABS
12.5000 mg | ORAL_TABLET | Freq: Two times a day (BID) | ORAL | Status: DC
Start: 2024-03-15 — End: 2024-03-15
  Administered 2024-03-15: 12.5 mg via ORAL
  Filled 2024-03-15: qty 1

## 2024-03-15 MED ORDER — PANTOPRAZOLE SODIUM 40 MG PO TBEC: 40.0000 mg | DELAYED_RELEASE_TABLET | Freq: Every day | ORAL | Status: DC

## 2024-03-15 MED ORDER — VITAMIN D 25 MCG (1000 UNIT) PO TABS
5000.0000 [IU] | ORAL_TABLET | Freq: Every day | ORAL | Status: DC
  Administered 2024-03-15: 5000 [IU] via ORAL
  Filled 2024-03-15: qty 5

## 2024-03-15 MED ORDER — CYCLOSPORINE 0.05 % OP EMUL
1.0000 [drp] | Freq: Two times a day (BID) | OPHTHALMIC | Status: DC
  Administered 2024-03-15: 1 [drp] via OPHTHALMIC
  Filled 2024-03-15: qty 30

## 2024-03-15 NOTE — Progress Notes (Signed)
 Physical Therapy Treatment Patient Details Name: Robin Blackburn MRN: [id number] DOB: [birth year] Today's Date: 03/15/2024   History of Present Illness Robin Blackburn is a 78 y.o. female with medical history significant for rheumatoid arthritis, stroke, fibromyalgia, hypertension, chronic pain.  Patient was brought to the ED with reports of abnormal lab.  Patient reports chronic poor oral intake because she does not like the food at the nursing home, she denies vomiting or diarrhea.  She reports no abdominal pain and pain with urination over the past few days.  She reports she has not been able to void well recently, she is unsure why.    PT Comments  PT refused to transfer to chair stating it is not as sturdy as her wheelchair at the nursing home.  Therapist tried, without success to convince her that the recliner was just as stable.  Pt able to complete bed mobility I, able to sit I and complete sitting exercises and able to maneuver self to head of bed so she would be properly aligned once supine.     If plan is discharge home, recommend the following: A lot of help with walking and/or transfers;Assistance with cooking/housework;Help with stairs or ramp for entrance;Assist for transportation;A lot of help with bathing/dressing/bathroom   Can travel by private vehicle     No  Equipment Recommendations  None recommended by PT    Recommendations for Other Services  none     Precautions / Restrictions Precautions Precautions: Fall Recall of Precautions/Restrictions: Intact Restrictions Weight Bearing Restrictions Per Provider Order: No Other Position/Activity Restrictions: states a box fell on her Lt foot and she can not bear full weight on her Lt foot at this time.     Mobility  Bed Mobility Overal bed mobility: Modified Independent Bed Mobility: Supine to Sit, Sit to Supine     Supine to sit: Modified independent (Device/Increase time) Sit to supine: Modified independent  (Device/Increase time)        Transfers  Refused                 General transfer comment: PT refuses to attempt    Ambulation/Gait  Baseline is non ambulatory             General Gait Details: PT is non ambulatory at baseline   Stairs  Lives in NH N/A               Communication Communication Communication: No apparent difficulties  Cognition Arousal: Alert Behavior During Therapy: WFL for tasks assessed/performed   PT - Cognitive impairments: No apparent impairments                         Following commands: Intact      Cueing Cueing Techniques: Verbal cues  Exercises General Exercises - Lower Extremity Ankle Circles/Pumps: Seated, 10 reps Long Arc Quad: Seated, 10 reps Toe Raises: Seated, 5 reps    General Comments        Pertinent Vitals/Pain Pain Assessment Faces Pain Scale: Hurts a little bit Pain Location: states all over, has fibromyalgia Pain Descriptors / Indicators: Aching Pain Intervention(s): Monitored during session, Limited activity within patient's tolerance    Home Living Family/patient expects to be discharged to:: Skilled nursing facility                        Prior Function   Transfers only         PT  Goals (current goals can now be found in the care plan section) Acute Rehab PT Goals Patient Stated Goal: return home able to transfer to her w/c PT Goal Formulation: With patient Time For Goal Achievement: 03/28/24 Potential to Achieve Goals: Good Progress towards PT goals: Progressing toward goals    Frequency    Min 3X/week      PT Plan  Continue to encourage to get out of bed.        AM-PAC PT 6 Clicks Mobility   Outcome Measure  Help needed turning from your back to your side while in a flat bed without using bedrails?: None Help needed moving from lying on your back to sitting on the side of a flat bed without using bedrails?: None Help needed moving to and from a bed to a  chair (including a wheelchair)?: A Lot Help needed standing up from a chair using your arms (e.g., wheelchair or bedside chair)?: A Lot Help needed to walk in hospital room?: A Lot Help needed climbing 3-5 steps with a railing? : A Lot 6 Click Score: 16    End of Session   Activity Tolerance: Patient tolerated treatment well Patient left: in bed;with call bell/phone within reach Nurse Communication: Mobility status PT Visit Diagnosis: Unsteadiness on feet (R26.81)     Time: 9079-9055 PT Time Calculation (min) (ACUTE ONLY): 24 min  Charges:    $Therapeutic Exercise: 8-22 mins $Therapeutic Activity: 8-22 mins                        Montie Metro, PT CLT [phone]  03/15/2024, 9:52 AM

## 2024-03-15 NOTE — Progress Notes (Signed)
 Report called to [facility], patient transported via EMS in stable condition.

## 2024-03-15 NOTE — Progress Notes (Signed)
 Patient ID: Robin Blackburn, female   DOB: April 05, 1946, 78 y.o.   MRN: [id number] S: She is complaining of GERD symptoms and gum irritation. O:BP (!) 148/66 (BP Location: Left Arm)   Pulse 66   Temp 98.4 F (36.9 C) (Oral)   Resp 18   Ht 5' 6 (1.676 m)   Wt 72 kg   SpO2 96%   BMI 25.62 kg/m   Intake/Output Summary (Last 24 hours) at 03/15/2024 0941 Last data filed at 03/15/2024 9362 Gross per 24 hour  Intake --  Output 2550 ml  Net -2550 ml   Intake/Output: I/O last 3 completed shifts: In: 600 [P.O.:600] Out: 4650 [Urine:4650]  Intake/Output this shift:  No intake/output data recorded. Weight change:  Gen: NAD CVS: RRR Resp:CTA Abd: +BS, soft, NT/ND  Ext: no edema  Recent Labs  Lab 03/10/24 1429 03/11/24 0507 03/12/24 1752 03/13/24 0440 03/14/24 0432 03/15/24 0523  NA 127* 131* 135 134* 139 141  141  K 6.2* 5.3* 4.5 4.9 3.4* 3.4*  3.3*  CL 97* 103 100 103 103 105  105  CO2 13* 12* 19* 12* 21* 24  24  GLUCOSE 128* 100* 139* 94 101* 89  89  BUN 82* 78* 71* 66* 54* 45*  45*  CREATININE 6.28* 6.14* 5.60* 4.81* 3.87* 3.25*  3.23*  ALBUMIN 3.0*  --   --  2.4* 2.5* 2.5*  CALCIUM  7.9* 7.1* 7.7* 7.5* 7.5* 7.8*  7.8*  PHOS  --   --   --  5.9* 4.7* 5.0*  AST 14*  --   --   --   --   --   ALT <5  --   --   --   --   --    Liver Function Tests: Recent Labs  Lab 03/10/24 1429 03/13/24 0440 03/14/24 0432 03/15/24 0523  AST 14*  --   --   --   ALT <5  --   --   --   ALKPHOS 210*  --   --   --   BILITOT 0.4  --   --   --   PROT 7.4  --   --   --   ALBUMIN 3.0* 2.4* 2.5* 2.5*   No results for input(s): LIPASE, AMYLASE in the last 168 hours. No results for input(s): AMMONIA in the last 168 hours. CBC: Recent Labs  Lab 03/10/24 1429 03/11/24 0507 03/12/24 1752 03/13/24 0440 03/14/24 0432 03/15/24 0523  WBC 13.1* 8.8 8.3  --  5.7 6.8  NEUTROABS 10.3*  --   --   --   --   --   HGB 7.9* 5.9* 9.9* 10.2* 9.1* 8.9*  HCT 27.0* 20.0* 30.4* 32.6* 28.3*  28.5*  MCV 104.7* 100.0 94.1  --  94.3 96.0  PLT 486* 411* 427*  --  467* 474*   Cardiac Enzymes: Recent Labs  Lab 03/10/24 1429  CKTOTAL 69   CBG: Recent Labs  Lab 03/10/24 2231  GLUCAP 117*    Iron Studies: No results for input(s): IRON, TIBC, TRANSFERRIN, FERRITIN in the last 72 hours. Studies/Results: MR BRAIN WO CONTRAST Result Date: 03/14/2024 EXAM: MRI BRAIN WITHOUT CONTRAST 03/14/2024 07:25:27 PM TECHNIQUE: Multiplanar multisequence MRI of the head/brain was performed without the administration of intravenous contrast. COMPARISON: 01/05/2023 CLINICAL HISTORY: Neuro deficit, acute, stroke suspected; dyarthria. FINDINGS: BRAIN AND VENTRICLES: No acute infarct. No intracranial hemorrhage. No mass. No midline shift. No hydrocephalus. The sella is unremarkable. Normal flow voids. Multifocal hyperintense T2-weighted signal within  the cerebral white matter, most commonly due to chronic small vessel disease. Mild volume loss old left thalamic small vessel infarct. ORBITS: No acute abnormality. SINUSES AND MASTOIDS: No acute abnormality. BONES AND SOFT TISSUES: Normal marrow signal. No acute soft tissue abnormality. IMPRESSION: 1. No acute intracranial abnormality. 2. Multifocal hyperintense T2-weighted signal within the cerebral white matter, most commonly due to chronic small vessel disease. 3. 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 4. Mild volume loss. Electronically signed by: Franky Stanford MD 03/14/2024 10:57 PM EDT RP Workstation: [id number]    ALPRAZolam   0.5 mg Oral BID   amLODipine   2.5 mg Oral Daily   Chlorhexidine  Gluconate Cloth  6 each Topical Daily   cholecalciferol   5,000 Units Oral Daily   cycloSPORINE   1 drop Both Eyes BID   [START ON 03/16/2024] ferrous sulfate   325 mg Oral Q breakfast   folic acid   1 mg Oral Daily   gabapentin   300 mg Oral TID   metoprolol  tartrate  12.5 mg Oral BID   mupirocin ointment  1 Application Nasal BID   [START ON 03/16/2024]  pantoprazole   40 mg Oral QAC breakfast   potassium chloride   20 mEq Oral Once   senna  1 tablet Oral BID   ticagrelor   60 mg Oral Daily   traZODone   100 mg Oral QHS   vitamin B-12  250 mcg Oral Daily    BMET    Component Value Date/Time   NA 141 03/15/2024 0523   NA 141 03/15/2024 0523   K 3.4 (L) 03/15/2024 0523   K 3.3 (L) 03/15/2024 0523   CL 105 03/15/2024 0523   CL 105 03/15/2024 0523   CO2 24 03/15/2024 0523   CO2 24 03/15/2024 0523   GLUCOSE 89 03/15/2024 0523   GLUCOSE 89 03/15/2024 0523   BUN 45 (H) 03/15/2024 0523   BUN 45 (H) 03/15/2024 0523   CREATININE 3.25 (H) 03/15/2024 0523   CREATININE 3.23 (H) 03/15/2024 0523   CALCIUM  7.8 (L) 03/15/2024 0523   CALCIUM  7.8 (L) 03/15/2024 0523   GFRNONAA 14 (L) 03/15/2024 0523   GFRNONAA 14 (L) 03/15/2024 0523   GFRAA >60 08/23/2018 0519   CBC    Component Value Date/Time   WBC 6.8 03/15/2024 0523   RBC 2.97 (L) 03/15/2024 0523   HGB 8.9 (L) 03/15/2024 0523   HCT 28.5 (L) 03/15/2024 0523   PLT 474 (H) 03/15/2024 0523   MCV 96.0 03/15/2024 0523   MCH 30.0 03/15/2024 0523   MCHC 31.2 03/15/2024 0523   RDW 15.1 03/15/2024 0523   LYMPHSABS 1.4 03/10/2024 1429   MONOABS 1.0 03/10/2024 1429   EOSABS 0.2 03/10/2024 1429   BASOSABS 0.1 03/10/2024 1429    Assessment/Plan: 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  Most likely due to ATN with another possible urinary tract infection.  Her scans show trabeculation and changes in her bladder which are consistent with a history of frequent urinary tract infections. Creatinine was noted to be 6.28 and during her last hospitalization her creatinine ranged from 1.3-1.96 but was 1.36 on 01/13/2024.  Creatinine on 02/01/2024 was 1.84.  Slowly improving to 3.87 this morning.    - Dr. Melia discussed the possible need for dialysis at least temporarily  at the beginning of her hospitalization.  She adamantly stated that she did not want long-term or even short-term dialysis.  Her POA was at the bedside at that time and was part of this discussion as well but the patient currently is able to make her own decisions.  She again verified that she would not want any form of RRT.  Thankfully her renal function continues to slowly improve. -changed to bicarbonate based isotonic fluids due to metabolic acidosis and is responded to fluids.  Now improving off of IVF's.     -Monitor Daily I/Os, Daily weight  -Maintain MAP>65 for optimal renal perfusion.  - Avoid nephrotoxic agents such as IV contrast, NSAIDs, and phosphate containing bowel preps (FLEETS) - Will need to follow up with our office after discharge   UTI - h/o MDR Klebsiella from prior hospitalization, urine culture sent. Hypertension -actually on the lower side and her blood pressure medicines as well as diuretic are being held appropriately. Metabolic acidosis - due to #1, resolved with IV bicarb. Hypokalemia - will replace po and follow.  Rheumatoid arthritis Anemia of chronic disease - Hgb improved after blood transfusion.  Continue to follow.  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  There is some resistance on gentle retraction which suggests it is in the right place.  May not be the right size as there appears to be significant leakage.  She is followed by [facility] urology as an outpatient.   Fairy RONAL Sellar, MD [facility]

## 2024-03-15 NOTE — TOC Transition Note (Signed)
 Transition of Care [facility]) - Discharge Note   Patient Details  Name: Robin Blackburn MRN: [id number] Date of Birth: 1946-03-24  Transition of Care [facility]) CM/SW Contact:  Lucie Lunger, LCSWA Phone Number: 03/15/2024, 11:37 AM   Clinical Narrative:    CSW updated that pt is medically stable for D/C back to [facility] today. CSW sent D/C clinicals to facility via HUB. CSW reached out to [place] in admissions requesting room and report numbers. CSW to provide RN with numbers when able. Med necessity to be printed to floor for RN. CSW spoke with Jafarrell to update on plan for D/C. EMS to be called for transport. TOC signing off.   Final next level of care: Skilled Nursing Facility Barriers to Discharge: Barriers Resolved   Patient Goals and CMS Choice Patient states their goals for this hospitalization and ongoing recovery are:: return to [facility] CMS Medicare.gov Compare Post Acute Care list provided to:: Patient Choice offered to / list presented to : Patient      Discharge Placement                Patient to be transferred to facility by: EMS Name of family member notified: Jafarrell Patient and family notified of of transfer: 03/15/24  Discharge Plan and Services Additional resources added to the After Visit Summary for       Post Acute Care Choice: Skilled Nursing Facility                               Social Drivers of Health (SDOH) Interventions SDOH Screenings   Food Insecurity: No Food Insecurity (03/11/2024)  Housing: Low Risk  (03/11/2024)  Transportation Needs: No Transportation Needs (03/11/2024)  Utilities: Not At Risk (03/11/2024)  Alcohol  Screen: Low Risk  (01/06/2022)  Depression (PHQ2-9): Low Risk  (02/25/2024)  Financial Resource Strain: Low Risk  (01/06/2022)  Physical Activity: Inactive (11/07/2022)   Received from [facility]  Social Connections: Socially Isolated (03/11/2024)  Stress: No Stress Concern Present (01/06/2022)  Tobacco Use: Low Risk   (03/10/2024)     Readmission Risk Interventions    03/11/2024    8:52 AM 01/09/2024   11:45 AM 01/08/2024    1:10 PM  Readmission Risk Prevention Plan  Transportation Screening Complete Complete Complete  Home Care Screening  Complete Complete  Medication Review (RN CM)  Complete Complete  Medication Review [employer]) Complete    HRI or Home Care Consult Complete    SW Recovery Care/Counseling Consult Complete    Palliative Care Screening Not Applicable    Skilled Nursing Facility Complete

## 2024-03-15 NOTE — Discharge Summary (Signed)
 Physician Discharge Summary  Robin Blackburn FMW:[id number] DOB: 1945/06/27 DOA: 03/10/2024  PCP: Bertell Satterfield, MD  Admit date: 03/10/2024 Discharge date: 03/15/2024  Admitted From:Home Disposition:  SNF  Recommendations for Outpatient Follow-up:  Follow up with PCP in 1-2 weeks Please obtain BMP/CBC in one week your next doctors visit.  Continue Foley catheter.  Outpatient follow-up with urology Iron supplements with bowel regimen Recommend outpatient discussion with PCP and gastroenterology for any endoscopic evaluation Avoid IV contrast, NSAIDs and phosphate containing bowel preps Patient follow-up with [facility].  Discussed with Dr. Rayburn at the time of discharge.  Hold torsemide  for now.     Discharge Condition: Stable CODE STATUS: Full  Diet recommendation: Renal  Brief/Interim Summary: Brief Narrative:   78 year old female with history of rheumatoid arthritis, stroke, fibromyalgia, HTN, chronic pain comes to the ED due to abnormal labs, poor oral intake.  Patient was found to have acute kidney injury likely from bladder outlet obstruction requiring Foley catheter placement.  Due to severe acidosis also was started on bicarb drip.  Baseline creatinine 1.7, admission creatinine 6.28.  Creatinine is slowly improving. Hospital course complicated by question of dysarthria therefore MRI of brain performed which is negative.  Eventually renal function stabilized and was slowly improving with Foley catheter in place.  Patient is recommended to help follow-up with Dr. Sherrilee from urology outpatient.  I have also advised that she discuss future need for colonoscopy and endoscopy with GI and PCP.  Currently hemoglobin is stable at 8.9.  For now also started iron supplements with bowel regimen.  Medically stable for discharge.  Assessment & Plan:   Acute kidney injury on CKD stage IIIb Metabolic acidosis Hyperkalemia, resolved -Baseline creatinine 1.7, admission  creatinine 6.28.  Likely from obstructive uropathy.  Improving since Foley catheter placement.  Also required bicarb drip.  Acidosis is improving.  Discussed with nephrology today on the day of discharge.  Cleared for discharge and their group will help arrange for outpatient follow-up.  Appreciate their assistance    Obstructive uropathy with possible UTI -Renal CT 10/2-shows complete bladder outlet obstruction with bilateral hydronephrosis without any evidence of renal stones.  Prior provider discussed case with Dr. Sherrilee from urology who recommends Foley catheter placement.  Patient completed 3 days of meropenem  on 10/4.  Plan is to continue Foley catheter with outpatient urology follow-up.  Renal function continues to slowly improve   Acute on chronic symptomatic anemia--hemoglobin down to 5.9 from 7.9 on admission Baseline hemoglobin 9.0, slowly trended down to 5.9.  Hemoccult is negative without any obvious signs of bleeding.  Required 2 units of PRBC transfusion on 10/3.  Patient is now back on Brilinta , hemoglobin 8.9. -Outpatient iron studies in August were unremarkable. -Patient should follow-up outpatient GI and PCP to ensure she is up-to-date with her colonoscopy  Dysarthria, uncontrolled -Wonder if this was uremia related.  MRI of the brain is negative for acute CVA   Hypertension -Hold home diuretics.  Will slowly resume home Norvasc  and Toprol    Rheumatoid arthritis- stable, on Arava  and Humira - Hold in the setting of concerns for possible acute infection   Social/Ethics--- as per patient her friend Mrs Dozier Jenny is a primary contact - Patient is a full code  DVT prophylaxis: Place and maintain sequential compression device Start: 03/11/24 2024 Place TED hose Start: 03/11/24 2024      Code Status: Full Code Family Communication:   Status is: Inpatient Ok to [employer]   PT Follow up Recs: Skilled  Nursing-Short Term Rehab (<3 Hours/Day)03/14/2024  1504  Subjective:  feeling well no complaints  Examination:  General exam: Appears calm and comfortable  Respiratory system: Clear to auscultation. Respiratory effort normal. Cardiovascular system: S1 & S2 heard, RRR. No JVD, murmurs, rubs, gallops or clicks. No pedal edema. Gastrointestinal system: Abdomen is nondistended, soft and nontender. No organomegaly or masses felt. Normal bowel sounds heard. Central nervous system: Alert and oriented. No focal neurological deficits. Extremities: Symmetric 5 x 5 power. Skin: No rashes, lesions or ulcers Psychiatry: Judgement and insight appear normal. Mood & affect appropriate. Foley in place    Discharge Diagnoses:  Principal Problem:   Acute-on-chronic kidney injury Active Problems:   Urinary tract infection   Hyperkalemia   Bladder outlet obstruction   Essential hypertension   Rheumatoid arthritis (HCC)   Chronic pain syndrome   Dysarthria      Discharge Exam: Vitals:   03/14/24 1930 03/15/24 0352  BP: (!) 167/70 (!) 148/66  Pulse: 73 66  Resp: 18 18  Temp: 98.2 F (36.8 C) 98.4 F (36.9 C)  SpO2:  96%   Vitals:   03/14/24 0412 03/14/24 1346 03/14/24 1930 03/15/24 0352  BP: (!) 146/64 136/77 (!) 167/70 (!) 148/66  Pulse: 79 83 73 66  Resp: 16 16 18 18   Temp: 98.6 F (37 C) 98 F (36.7 C) 98.2 F (36.8 C) 98.4 F (36.9 C)  TempSrc: Oral Oral Oral Oral  SpO2: 95% 98%  96%  Weight:      Height:          Discharge Instructions  Discharge Instructions     Discharge patient   Complete by: As directed    Discharge disposition: 03-Skilled Nursing Facility   Discharge patient date: 03/15/2024      Allergies as of 03/15/2024       Reactions   Cortisone Anaphylaxis   Cardiovascular Arrest   Fentanyl  Anaphylaxis, Hives, Other (See Comments)   felt like I had demons in my head   Hydromorphone  Anaphylaxis, Nausea And Vomiting   GI Intolerance   Iodinated Contrast Media Anaphylaxis, Hives, Rash,  Dermatitis   Respiratory Distress Iodinated contrast media (substance)   Keflex  [cephalexin ] Nausea And Vomiting   Meperidine Anaphylaxis, Shortness Of Breath   Respiratory Distress   Morphine Anaphylaxis, Nausea And Vomiting, Swelling   GI Intolerance   Oxycodone  Anaphylaxis   Oxycodone -acetaminophen  Nausea And Vomiting, Rash, Swelling, Dermatitis   GI Intolerance, Mouth swelling. acetaminophen  / oxycodone    Penicillins Anaphylaxis, Nausea And Vomiting, Other (See Comments)   Immediate rash, facial/tongue/throat swelling, SOB or lightheadedness with hypotension Product containing penicillin (product)   Afluria Preservative Free [influenza Virus Vacc Split Pf] Other (See Comments)   Unknown   Aspirin  Other (See Comments)   GI Intolerance   Ciprofloxacin  Hcl Other (See Comments)   Unknown    Codeine Other (See Comments)   Unknown    Influenza Vaccines Other (See Comments)   Unknown    Influenza Virus Vaccine Other (See Comments)   Unknown    Iohexol Other (See Comments)   Unknown   Meperidine Hcl Other (See Comments)   Unknown   Misc. Sulfonamide Containing Compounds Other (See Comments)   Unknown   Oxycodone  Hcl Other (See Comments)   Unknown   Penicillamine Other (See Comments)   Unknown    Shellfish Allergy Other (See Comments)   Glucosamine not an option Unknown    Sulfa  Antibiotics Nausea And Vomiting   Tolerates Bactrim  though   Troleandomycin Nausea  And Vomiting, Swelling   GI Intolerance   Potassium Chloride  Other (See Comments)   No reaction listed on MAR   Bisphosphonates Hives, Other (See Comments)   GI intolerance   Ciprofloxacin  Nausea Only, Rash   Iodine Hives   Levaquin [levofloxacin In D5w] Nausea And Vomiting, Rash   Levofloxacin Hives, Other (See Comments)   Mental Status Changes, Confusion   Morphine And Codeine Rash   Oxytetracycline Nausea And Vomiting   Percocet [oxycodone -acetaminophen ] Swelling   Mouth swelling.   Sulfur Nausea And  Vomiting, Rash, Dermatitis   GI Intolerance sulfur        Medication List     PAUSE taking these medications    torsemide  20 MG tablet Wait to take this until your doctor or other care provider tells you to start again. Commonly known as: DEMADEX  Take 20 mg by mouth daily.       STOP taking these medications    lidocaine  5 % Commonly known as: LIDODERM    potassium chloride  10 MEQ tablet Commonly known as: KLOR-CON  M       TAKE these medications    ALPRAZolam  0.5 MG tablet Commonly known as: XANAX  Take 1 tablet (0.5 mg total) by mouth 2 (two) times daily as needed for anxiety. What changed:  when to take this reasons to take this   amLODipine  2.5 MG tablet Commonly known as: NORVASC  Take 2.5 mg by mouth daily. Hold for SBP<110   benzonatate 200 MG capsule Commonly known as: TESSALON Take 200 mg by mouth every 8 (eight) hours as needed for cough.   chlorhexidine  0.12 % solution Commonly known as: PERIDEX  Use as directed 15 mLs in the mouth or throat 2 (two) times daily.   Cholecalciferol  125 MCG (5000 UT) Tabs Take 1 tablet by mouth daily.   Cranberry 450 MG Tabs Take 1 tablet by mouth in the morning and at bedtime. For UTI   dicyclomine  20 MG tablet Commonly known as: BENTYL  Take 20 mg by mouth every 6 (six) hours. Abdominal cramps   estradiol  0.1 MG/GM vaginal cream Commonly known as: ESTRACE  Place 1 Applicatorful vaginally every other day. Discard plastic applicator. Insert a blueberry size amount (approximately 1 gram) of cream on fingertip inside vagina at bedtime every other night for long term use. What changed:  when to take this additional instructions   ferrous sulfate  325 (65 FE) MG EC tablet Take 1 tablet (325 mg total) by mouth daily with breakfast.   fluticasone 50 MCG/ACT nasal spray Commonly known as: FLONASE Place 1 spray into both nostrils in the morning and at bedtime.   folic acid  1 MG tablet Commonly known as:  FOLVITE  Take 1 mg by mouth daily.   gabapentin  300 MG capsule Commonly known as: NEURONTIN  Take 1 capsule (300 mg total) by mouth 3 (three) times daily as needed (neuropathy pain symptoms). What changed: when to take this   guaiFENesin-dextromethorphan 100-10 MG/5ML syrup Commonly known as: ROBITUSSIN DM Take 10 mLs by mouth at bedtime.   Mucinex DM Maximum Strength 60-1200 MG Tb12 Take 1 tablet by mouth every 12 (twelve) hours as needed (cough/congestion).   Humira (2 Pen) 40 MG/0.8ML Ajkt pen Generic drug: adalimumab Inject 40 mg into the skin every 14 (fourteen) days.   lansoprazole 30 MG capsule Commonly known as: PREVACID Take 60 mg by mouth 2 (two) times daily before a meal. DO NOT CHANGE PER MD   leflunomide  10 MG tablet Commonly known as: ARAVA  Take 10 mg by mouth  daily.   Lokelma  10 g Pack packet Generic drug: sodium zirconium cyclosilicate  Take 10 g by mouth 3 (three) times daily.   Melatonin 10 MG Tabs Take 10 mg by mouth at bedtime.   methenamine  1 g tablet Commonly known as: HIPREX  Take 1 tablet (1 g total) by mouth 2 (two) times daily. Prophylactic   methocarbamol  500 MG tablet Commonly known as: ROBAXIN  Take 1 tablet (500 mg total) by mouth every 8 (eight) hours as needed for muscle spasms.   metoprolol  tartrate 25 MG tablet Commonly known as: LOPRESSOR  Take 0.5 tablets (12.5 mg total) by mouth 2 (two) times daily.   Nutritional Supplement Liqd Take 120 mLs by mouth daily at 6 PM. House supplement on MAR   ondansetron  4 MG tablet Commonly known as: ZOFRAN  Take 4 mg by mouth every 8 (eight) hours as needed for vomiting or nausea.   phenazopyridine  200 MG tablet Commonly known as: PYRIDIUM  Take 200 mg by mouth every 8 (eight) hours as needed (dysuria).   polyethylene glycol 17 g packet Commonly known as: MIRALAX  / GLYCOLAX  Take 17 g by mouth daily as needed for mild constipation.   PreserVision AREDS 2 Caps Take 1 capsule by mouth in the  morning and at bedtime.   Probiotic (Lactobacillus) Caps Take 1 capsule by mouth in the morning, at noon, and at bedtime.   Restasis  0.05 % ophthalmic emulsion Generic drug: cycloSPORINE  Place 1 drop into both eyes 2 (two) times daily. Dry eyes   senna 8.6 MG tablet Commonly known as: SENOKOT Take 1 tablet by mouth 2 (two) times daily.   sodium chloride  0.9 % infusion Inject 10 mLs into the vein daily. Use 10 mL intravenously one time a day for flush upper right arm picc line   ticagrelor  60 MG Tabs tablet Commonly known as: BRILINTA  Take 60 mg by mouth daily.   traMADol  50 MG tablet Commonly known as: ULTRAM  Take 1 tablet (50 mg total) by mouth every 8 (eight) hours as needed for severe pain (pain score 7-10).   traZODone  100 MG tablet Commonly known as: DESYREL  Take 100 mg by mouth at bedtime.   vitamin B-12 250 MCG tablet Commonly known as: CYANOCOBALAMIN  Take 250 mcg by mouth daily.        Follow-up Information     Bertell Satterfield, MD Follow up in 1 week(s).   Specialty: Internal Medicine Contact information: [address] [facility] [place] [address] [phone]                Allergies  Allergen Reactions   Cortisone Anaphylaxis    Cardiovascular Arrest   Fentanyl  Anaphylaxis, Hives and Other (See Comments)    felt like I had demons in my head   Hydromorphone  Anaphylaxis and Nausea And Vomiting    GI Intolerance   Iodinated Contrast Media Anaphylaxis, Hives, Rash and Dermatitis    Respiratory Distress  Iodinated contrast media (substance)   Keflex  [Cephalexin ] Nausea And Vomiting   Meperidine Anaphylaxis and Shortness Of Breath    Respiratory Distress   Morphine Anaphylaxis, Nausea And Vomiting and Swelling    GI Intolerance   Oxycodone  Anaphylaxis   Oxycodone -Acetaminophen  Nausea And Vomiting, Rash, Swelling and Dermatitis    GI Intolerance, Mouth swelling.  acetaminophen  / oxycodone    Penicillins Anaphylaxis, Nausea And  Vomiting and Other (See Comments)    Immediate rash, facial/tongue/throat swelling, SOB or lightheadedness with hypotension  Product containing penicillin (product)   Afluria Preservative Free [Influenza Virus Vacc Split Pf]  Other (See Comments)    Unknown   Aspirin  Other (See Comments)    GI Intolerance   Ciprofloxacin  Hcl Other (See Comments)    Unknown    Codeine Other (See Comments)    Unknown    Influenza Vaccines Other (See Comments)    Unknown    Influenza Virus Vaccine Other (See Comments)    Unknown    Iohexol Other (See Comments)    Unknown   Meperidine Hcl Other (See Comments)    Unknown   Misc. Sulfonamide Containing Compounds Other (See Comments)    Unknown   Oxycodone  Hcl Other (See Comments)    Unknown   Penicillamine Other (See Comments)    Unknown    Shellfish Allergy Other (See Comments)    Glucosamine not an option Unknown    Sulfa  Antibiotics Nausea And Vomiting    Tolerates Bactrim  though   Troleandomycin Nausea And Vomiting and Swelling    GI Intolerance   Potassium Chloride  Other (See Comments)    No reaction listed on MAR   Bisphosphonates Hives and Other (See Comments)    GI intolerance   Ciprofloxacin  Nausea Only and Rash   Iodine Hives   Levaquin [Levofloxacin In D5w] Nausea And Vomiting and Rash   Levofloxacin Hives and Other (See Comments)    Mental Status Changes, Confusion   Morphine And Codeine Rash   Oxytetracycline Nausea And Vomiting   Percocet [Oxycodone -Acetaminophen ] Swelling    Mouth swelling.   Sulfur Nausea And Vomiting, Rash and Dermatitis    GI Intolerance  sulfur    You were cared for by a hospitalist during your hospital stay. If you have any questions about your discharge medications or the care you received while you were in the hospital after you are discharged, you can call the unit and asked to speak with the hospitalist on call if the hospitalist that took care of you is not available. Once you are discharged, your  primary care physician will handle any further medical issues. Please note that no refills for any discharge medications will be authorized once you are discharged, as it is imperative that you return to your primary care physician (or establish a relationship with a primary care physician if you do not have one) for your aftercare needs so that they can reassess your need for medications and monitor your lab values.  You were cared for by a hospitalist during your hospital stay. If you have any questions about your discharge medications or the care you received while you were in the hospital after you are discharged, you can call the unit and asked to speak with the hospitalist on call if the hospitalist that took care of you is not available. Once you are discharged, your primary care physician will handle any further medical issues. Please note that NO REFILLS for any discharge medications will be authorized once you are discharged, as it is imperative that you return to your primary care physician (or establish a relationship with a primary care physician if you do not have one) for your aftercare needs so that they can reassess your need for medications and monitor your lab values.  Please request your Prim.MD to go over all Hospital Tests and Procedure/Radiological results at the follow up, please get all Hospital records sent to your Prim MD by signing hospital release before you go home.  Get CBC, CMP, 2 view Chest X ray checked  by Primary MD during your next visit or SNF MD in  5-7 days ( we routinely change or add medications that can affect your baseline labs and fluid status, therefore we recommend that you get the mentioned basic workup next visit with your PCP, your PCP may decide not to get them or add new tests based on their clinical decision)  On your next visit with your primary care physician please Get Medicines reviewed and adjusted.  If you experience worsening of your admission  symptoms, develop shortness of breath, life threatening emergency, suicidal or homicidal thoughts you must seek medical attention immediately by calling 911 or calling your MD immediately  if symptoms less severe.  You Must read complete instructions/literature along with all the possible adverse reactions/side effects for all the Medicines you take and that have been prescribed to you. Take any new Medicines after you have completely understood and accpet all the possible adverse reactions/side effects.   Do not drive, operate heavy machinery, perform activities at heights, swimming or participation in water activities or provide baby sitting services if your were admitted for syncope or siezures until you have seen by Primary MD or a Neurologist and advised to do so again.  Do not drive when taking Pain medications.   Procedures/Studies: MR BRAIN WO CONTRAST Result Date: 03/14/2024 EXAM: MRI BRAIN WITHOUT CONTRAST 03/14/2024 07:25:27 PM TECHNIQUE: Multiplanar multisequence MRI of the head/brain was performed without the administration of intravenous contrast. COMPARISON: 01/05/2023 CLINICAL HISTORY: Neuro deficit, acute, stroke suspected; dyarthria. FINDINGS: BRAIN AND VENTRICLES: No acute infarct. No intracranial hemorrhage. No mass. No midline shift. No hydrocephalus. The sella is unremarkable. Normal flow voids. Multifocal hyperintense T2-weighted signal within the cerebral white matter, most commonly due to chronic small vessel disease. Mild volume loss old left thalamic small vessel infarct. ORBITS: No acute abnormality. SINUSES AND MASTOIDS: No acute abnormality. BONES AND SOFT TISSUES: Normal marrow signal. No acute soft tissue abnormality. IMPRESSION: 1. No acute intracranial abnormality. 2. Multifocal hyperintense T2-weighted signal within the cerebral white matter, most commonly due to chronic small vessel disease. 3. 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 4. Mild volume loss. Electronically  signed by: Franky Stanford MD 03/14/2024 10:57 PM EDT RP Workstation: [id number]   US  Renal Result Date: 03/10/2024 EXAM: US  Retroperitoneum Complete, Renal. CLINICAL HISTORY: acute kidney failure. TECHNIQUE: Real-time ultrasound of the retroperitoneum (complete) with image documentation. COMPARISON: CT dated 03/10/2024. FINDINGS: RIGHT KIDNEY: Right kidney measures 10.2 x 5.5 x 5.4 cm. Volume is 161 cc. Moderate hydroureteronephrosis, as on CT. No renal stone or mass visualized. LEFT KIDNEY: Left kidney measures 10.6 x 5.9 x 4.6 cm. Volume is 154 cc. Moderate hydroureteronephrosis, as on CT. No renal stone or mass visualized. BLADDER: Mild bladder wall thickening. Prevoid Bladder Volume of 42 cc. IMPRESSION: 1. Moderate bilateral hydroureteronephrosis. 2. Mild bladder wall thickening, likely due to bladder outlet obstruction. Electronically signed by: Rockey Kilts MD 03/10/2024 05:17 PM EDT RP Workstation: [id number]   CT Renal Stone Study Result Date: 03/10/2024 EXAM: CT UROGRAM 03/10/2024 03:58:52 PM TECHNIQUE: CT of the abdomen and pelvis was performed without the administration of intravenous contrast. Multiplanar reformatted images as well as MIP urogram images are provided for review. Automated exposure control, iterative reconstruction, and/or weight based adjustment of the mA/kV was utilized to reduce the radiation dose to as low as reasonably achievable. COMPARISON: 01/07/2024 CLINICAL HISTORY: acute kidney failure. Pt brb EMS from [facility] for Abnormal Labs. All V/S WDL. No other complaints at this time. FINDINGS: LOWER CHEST: Small bilateral pleural effusions, right greater than left. Minimal dependent lower  lobe atelectasis. LIVER: The liver is unremarkable. GALLBLADDER AND BILE DUCTS: Gallbladder is unremarkable. No biliary ductal dilatation. SPLEEN: No acute abnormality. PANCREAS: No acute abnormality. ADRENAL GLANDS: No acute abnormality. KIDNEYS, URETERS AND BLADDER: There is continued  bilateral hydroureteronephrosis unchanged since prior exam. Stable bilateral mucosal thickening throughout the renal pelvis and bilateral ureters. Persistent bladder wall thickening with multiple bladder diverticula, consistent with sequela of bladder outlet obstruction. Perivesicular fat stranding could reflect superimposed cystitis. No stones in the kidneys or ureters. No perinephric or periureteral stranding. GI AND BOWEL: Stomach demonstrates no acute abnormality. Sigmoid diverticulosis without evidence of acute diverticulitis. Appendix is surgically absent. There is no bowel obstruction. PERITONEUM AND RETROPERITONEUM: No ascites. No free air. VASCULATURE: Aorta is normal in caliber. Aortic atherosclerosis. LYMPH NODES: No lymphadenopathy. REPRODUCTIVE ORGANS: No acute abnormality. BONES AND SOFT TISSUES: No acute osseous abnormality. No focal soft tissue abnormality. IMPRESSION: 1. Findings compatible with bladder outlet obstruction, with persistent bladder wall thickening and stable bilateral hydroureteronephrosis. Perivesicular Fat stranding and uroepithelial mural thickening consistent with cystitis and urinary tract infection. Please correlate with urinalysis. 2. No urinary tract calculi. 3. Sigmoid diverticulosis without diverticulitis. Electronically signed by: Ozell Daring MD 03/10/2024 04:05 PM EDT RP Workstation: [id number]   DG Chest 2 View Result Date: 02/23/2024 EXAM: 2 VIEW(S) XRAY OF THE CHEST 02/18/2024 02:27:39 PM COMPARISON: 01/13/2024 CLINICAL HISTORY: Pneumonia. FINDINGS: LINES, TUBES AND DEVICES: Stable right PICC line with tip in mid SVC. LUNGS AND PLEURA: Left basilar scarring. No focal pulmonary opacity. No pulmonary edema. No pleural effusion. No pneumothorax. HEART AND MEDIASTINUM: No acute abnormality of the cardiac and mediastinal silhouettes. Aortic atherosclerotic calcification. BONES AND SOFT TISSUES: No acute osseous abnormality. IMPRESSION: 1. No acute findings. 2. Stable  right PICC line with tip in mid SVC. Electronically signed by: Dayne Hassell MD 02/23/2024 01:32 PM EDT RP Workstation: [id number]     The results of significant diagnostics from this hospitalization (including imaging, microbiology, ancillary and laboratory) are listed below for reference.     Microbiology: Recent Results (from the past 240 hours)  Resp panel by RT-PCR (RSV, Flu A&B, Covid) Anterior Nasal Swab     Status: None   Collection Time: 03/10/24  2:48 PM   Specimen: Anterior Nasal Swab  Result Value Ref Range Status   SARS Coronavirus 2 by RT PCR NEGATIVE NEGATIVE Final    Comment: (NOTE) SARS-CoV-2 target nucleic acids are NOT DETECTED.  The SARS-CoV-2 RNA is generally detectable in upper respiratory specimens during the acute phase of infection. The lowest concentration of SARS-CoV-2 viral copies this assay can detect is 138 copies/mL. A negative result does not preclude SARS-Cov-2 infection and should not be used as the sole basis for treatment or other patient management decisions. A negative result may occur with  improper specimen collection/handling, submission of specimen other than nasopharyngeal swab, presence of viral mutation(s) within the areas targeted by this assay, and inadequate number of viral copies(<138 copies/mL). A negative result must be combined with clinical observations, patient history, and epidemiological information. The expected result is Negative.  Fact Sheet for Patients:  BloggerCourse.com  Fact Sheet for Healthcare Providers:  SeriousBroker.it  This test is no t yet approved or cleared by the [place]  FDA and  has been authorized for detection and/or diagnosis of SARS-CoV-2 by FDA under an Emergency Use Authorization (EUA). This EUA will remain  in effect (meaning this test can be used) for the duration of the COVID-19 declaration under Section 564(b)(1) of the Act,  21  U.S.C.section 360bbb-3(b)(1), unless the authorization is terminated  or revoked sooner.       Influenza A by PCR NEGATIVE NEGATIVE Final   Influenza B by PCR NEGATIVE NEGATIVE Final    Comment: (NOTE) The Xpert Xpress SARS-CoV-2/FLU/RSV plus assay is intended as an aid in the diagnosis of influenza from Nasopharyngeal swab specimens and should not be used as a sole basis for treatment. Nasal washings and aspirates are unacceptable for Xpert Xpress SARS-CoV-2/FLU/RSV testing.  Fact Sheet for Patients: BloggerCourse.com  Fact Sheet for Healthcare Providers: SeriousBroker.it  This test is not yet approved or cleared by the [place]  FDA and has been authorized for detection and/or diagnosis of SARS-CoV-2 by FDA under an Emergency Use Authorization (EUA). This EUA will remain in effect (meaning this test can be used) for the duration of the COVID-19 declaration under Section 564(b)(1) of the Act, 21 U.S.C. section 360bbb-3(b)(1), unless the authorization is terminated or revoked.     Resp Syncytial Virus by PCR NEGATIVE NEGATIVE Final    Comment: (NOTE) Fact Sheet for Patients: BloggerCourse.com  Fact Sheet for Healthcare Providers: SeriousBroker.it  This test is not yet approved or cleared by the [place]  FDA and has been authorized for detection and/or diagnosis of SARS-CoV-2 by FDA under an Emergency Use Authorization (EUA). This EUA will remain in effect (meaning this test can be used) for the duration of the COVID-19 declaration under Section 564(b)(1) of the Act, 21 U.S.C. section 360bbb-3(b)(1), unless the authorization is terminated or revoked.  Performed at [facility], [address], [place], [place] [address]   Urine Culture     Status: None   Collection Time: 03/10/24  8:30 PM   Specimen: Urine, Clean Catch  Result Value Ref Range Status    Specimen Description   Final    URINE, CLEAN CATCH Performed at [facility], [address], [place], [place] [address]    Special Requests   Final    NONE Performed at [facility], [address], [place], [place] [address]    Culture   Final    NO GROWTH Performed at [facility], [address] [place], [place] [address]    Report Status 03/12/2024 FINAL  Final  MRSA Next Gen by PCR, Nasal     Status: Abnormal   Collection Time: 03/10/24  8:57 PM   Specimen: Nasal Mucosa; Nasal Swab  Result Value Ref Range Status   MRSA by PCR Next Gen DETECTED (A) NOT DETECTED Final    Comment: RESULT CALLED TO, READ BACK BY AND VERIFIED WITH: M MARSH,RN@2353  03/10/24 MK (NOTE) The GeneXpert MRSA Assay (FDA approved for NASAL specimens only), is one component of a comprehensive MRSA colonization surveillance program. It is not intended to diagnose MRSA infection nor to guide or monitor treatment for MRSA infections. Test performance is not FDA approved in patients less than 2 years old. Performed at [facility], [address], [place], [place] [address]      Labs: BNP (last 3 results) No results for input(s): BNP in the last 8760 hours. Basic Metabolic Panel: Recent Labs  Lab 03/11/24 0507 03/12/24 1752 03/13/24 0440 03/14/24 0432 03/15/24 0523  NA 131* 135 134* 139 141  141  K 5.3* 4.5 4.9 3.4* 3.4*  3.3*  CL 103 100 103 103 105  105  CO2 12* 19* 12* 21* 24  24  GLUCOSE 100* 139* 94 101* 89  89  BUN 78* 71* 66* 54* 45*  45*  CREATININE  6.14* 5.60* 4.81* 3.87* 3.25*  3.23*  CALCIUM  7.1* 7.7* 7.5* 7.5* 7.8*  7.8*  MG  --   --   --   --  2.0  PHOS  --   --  5.9* 4.7* 5.0*   Liver Function Tests: Recent Labs  Lab 03/10/24 1429 03/13/24 0440 03/14/24 0432 03/15/24 0523  AST 14*  --   --   --   ALT <5  --   --   --   ALKPHOS 210*  --   --   --   BILITOT 0.4  --   --   --   PROT 7.4  --   --   --   ALBUMIN 3.0* 2.4* 2.5* 2.5*   No results for  input(s): LIPASE, AMYLASE in the last 168 hours. No results for input(s): AMMONIA in the last 168 hours. CBC: Recent Labs  Lab 03/10/24 1429 03/11/24 0507 03/12/24 1752 03/13/24 0440 03/14/24 0432 03/15/24 0523  WBC 13.1* 8.8 8.3  --  5.7 6.8  NEUTROABS 10.3*  --   --   --   --   --   HGB 7.9* 5.9* 9.9* 10.2* 9.1* 8.9*  HCT 27.0* 20.0* 30.4* 32.6* 28.3* 28.5*  MCV 104.7* 100.0 94.1  --  94.3 96.0  PLT 486* 411* 427*  --  467* 474*   Cardiac Enzymes: Recent Labs  Lab 03/10/24 1429  CKTOTAL 69   BNP: Invalid input(s): POCBNP CBG: Recent Labs  Lab 03/10/24 2231  GLUCAP 117*   D-Dimer No results for input(s): DDIMER in the last 72 hours. Hgb A1c No results for input(s): HGBA1C in the last 72 hours. Lipid Profile No results for input(s): CHOL, HDL, LDLCALC, TRIG, CHOLHDL, LDLDIRECT in the last 72 hours. Thyroid  function studies No results for input(s): TSH, T4TOTAL, T3FREE, THYROIDAB in the last 72 hours.  Invalid input(s): FREET3 Anemia work up No results for input(s): VITAMINB12, FOLATE, FERRITIN, TIBC, IRON, RETICCTPCT in the last 72 hours. Urinalysis    Component Value Date/Time   COLORURINE AMBER (A) 03/10/2024 2030   APPEARANCEUR TURBID (A) 03/10/2024 2030   APPEARANCEUR Cloudy (A) 02/10/2023 1454   LABSPEC 1.010 03/10/2024 2030   PHURINE 5.0 03/10/2024 2030   GLUCOSEU NEGATIVE 03/10/2024 2030   HGBUR NEGATIVE 03/10/2024 2030   BILIRUBINUR NEGATIVE 03/10/2024 2030   BILIRUBINUR Negative 02/10/2023 1454   KETONESUR NEGATIVE 03/10/2024 2030   PROTEINUR 100 (A) 03/10/2024 2030   UROBILINOGEN 0.2 04/10/2015 2212   NITRITE NEGATIVE 03/10/2024 2030   LEUKOCYTESUR LARGE (A) 03/10/2024 2030   Sepsis Labs Recent Labs  Lab 03/11/24 0507 03/12/24 1752 03/14/24 0432 03/15/24 0523  WBC 8.8 8.3 5.7 6.8   Microbiology Recent Results (from the past 240 hours)  Resp panel by RT-PCR (RSV, Flu A&B, Covid) Anterior  Nasal Swab     Status: None   Collection Time: 03/10/24  2:48 PM   Specimen: Anterior Nasal Swab  Result Value Ref Range Status   SARS Coronavirus 2 by RT PCR NEGATIVE NEGATIVE Final    Comment: (NOTE) SARS-CoV-2 target nucleic acids are NOT DETECTED.  The SARS-CoV-2 RNA is generally detectable in upper respiratory specimens during the acute phase of infection. The lowest concentration of SARS-CoV-2 viral copies this assay can detect is 138 copies/mL. A negative result does not preclude SARS-Cov-2 infection and should not be used as the sole basis for treatment or other patient management decisions. A negative result may occur with  improper specimen collection/handling, submission of specimen other than nasopharyngeal  swab, presence of viral mutation(s) within the areas targeted by this assay, and inadequate number of viral copies(<138 copies/mL). A negative result must be combined with clinical observations, patient history, and epidemiological information. The expected result is Negative.  Fact Sheet for Patients:  BloggerCourse.com  Fact Sheet for Healthcare Providers:  SeriousBroker.it  This test is no t yet approved or cleared by the [place]  FDA and  has been authorized for detection and/or diagnosis of SARS-CoV-2 by FDA under an Emergency Use Authorization (EUA). This EUA will remain  in effect (meaning this test can be used) for the duration of the COVID-19 declaration under Section 564(b)(1) of the Act, 21 U.S.C.section 360bbb-3(b)(1), unless the authorization is terminated  or revoked sooner.       Influenza A by PCR NEGATIVE NEGATIVE Final   Influenza B by PCR NEGATIVE NEGATIVE Final    Comment: (NOTE) The Xpert Xpress SARS-CoV-2/FLU/RSV plus assay is intended as an aid in the diagnosis of influenza from Nasopharyngeal swab specimens and should not be used as a sole basis for treatment. Nasal washings  and aspirates are unacceptable for Xpert Xpress SARS-CoV-2/FLU/RSV testing.  Fact Sheet for Patients: BloggerCourse.com  Fact Sheet for Healthcare Providers: SeriousBroker.it  This test is not yet approved or cleared by the [place]  FDA and has been authorized for detection and/or diagnosis of SARS-CoV-2 by FDA under an Emergency Use Authorization (EUA). This EUA will remain in effect (meaning this test can be used) for the duration of the COVID-19 declaration under Section 564(b)(1) of the Act, 21 U.S.C. section 360bbb-3(b)(1), unless the authorization is terminated or revoked.     Resp Syncytial Virus by PCR NEGATIVE NEGATIVE Final    Comment: (NOTE) Fact Sheet for Patients: BloggerCourse.com  Fact Sheet for Healthcare Providers: SeriousBroker.it  This test is not yet approved or cleared by the [place]  FDA and has been authorized for detection and/or diagnosis of SARS-CoV-2 by FDA under an Emergency Use Authorization (EUA). This EUA will remain in effect (meaning this test can be used) for the duration of the COVID-19 declaration under Section 564(b)(1) of the Act, 21 U.S.C. section 360bbb-3(b)(1), unless the authorization is terminated or revoked.  Performed at [facility], [address] [place], [place] [address]   Urine Culture     Status: None   Collection Time: 03/10/24  8:30 PM   Specimen: Urine, Clean Catch  Result Value Ref Range Status   Specimen Description   Final    URINE, CLEAN CATCH Performed at [facility], [address] [place], [place] [address]    Special Requests   Final    NONE Performed at [facility], [address] [place], [place] [address]    Culture   Final    NO GROWTH Performed at [facility], [address] [place], [place] [address]    Report Status 03/12/2024 FINAL  Final  MRSA Next Gen by PCR, Nasal      Status: Abnormal   Collection Time: 03/10/24  8:57 PM   Specimen: Nasal Mucosa; Nasal Swab  Result Value Ref Range Status   MRSA by PCR Next Gen DETECTED (A) NOT DETECTED Final    Comment: RESULT CALLED TO, READ BACK BY AND VERIFIED WITH: M MARSH,RN@2353  03/10/24 MK (NOTE) The GeneXpert MRSA Assay (FDA approved for NASAL specimens only), is one component of a comprehensive MRSA colonization surveillance program. It is not intended to diagnose MRSA infection nor to guide or monitor treatment for MRSA infections. Test performance  is not FDA approved in patients less than 60 years old. Performed at [facility], [address], [place], [place] [address]      Time coordinating discharge:  I have spent 35 minutes face to face with the patient and on the ward discussing the patients care, assessment, plan and disposition with other care givers. >50% of the time was devoted counseling the patient about the risks and benefits of treatment/Discharge disposition and coordinating care.   SIGNED:   Burgess JAYSON Dare, MD  [facility] 03/15/2024, 11:26 AM   If 7PM-7AM, please contact night-coverage

## 2024-03-15 NOTE — Care Management Important Message (Signed)
 Important Message  Patient Details  Name: Robin Blackburn MRN: [id number] Date of Birth: [birth year]   Important Message Given:  Yes - Medicare IM     [name] L Jahlia Omura 03/15/2024, 12:16 PM

## 2024-03-16 DIAGNOSIS — N179 Acute kidney failure, unspecified: Secondary | ICD-10-CM | POA: Diagnosis not present

## 2024-03-16 DIAGNOSIS — I10 Essential (primary) hypertension: Secondary | ICD-10-CM | POA: Diagnosis not present

## 2024-03-16 DIAGNOSIS — M069 Rheumatoid arthritis, unspecified: Secondary | ICD-10-CM | POA: Diagnosis not present

## 2024-03-16 DIAGNOSIS — N139 Obstructive and reflux uropathy, unspecified: Secondary | ICD-10-CM | POA: Diagnosis not present

## 2024-03-17 DIAGNOSIS — M069 Rheumatoid arthritis, unspecified: Secondary | ICD-10-CM | POA: Diagnosis not present

## 2024-03-17 DIAGNOSIS — N179 Acute kidney failure, unspecified: Secondary | ICD-10-CM | POA: Diagnosis not present

## 2024-03-17 DIAGNOSIS — N184 Chronic kidney disease, stage 4 (severe): Secondary | ICD-10-CM | POA: Diagnosis not present

## 2024-03-17 DIAGNOSIS — D631 Anemia in chronic kidney disease: Secondary | ICD-10-CM | POA: Diagnosis not present

## 2024-03-18 DIAGNOSIS — N179 Acute kidney failure, unspecified: Secondary | ICD-10-CM | POA: Diagnosis not present

## 2024-03-18 DIAGNOSIS — D631 Anemia in chronic kidney disease: Secondary | ICD-10-CM | POA: Diagnosis not present

## 2024-03-18 DIAGNOSIS — M069 Rheumatoid arthritis, unspecified: Secondary | ICD-10-CM | POA: Diagnosis not present

## 2024-03-18 DIAGNOSIS — N184 Chronic kidney disease, stage 4 (severe): Secondary | ICD-10-CM | POA: Diagnosis not present

## 2024-03-21 DIAGNOSIS — I129 Hypertensive chronic kidney disease with stage 1 through stage 4 chronic kidney disease, or unspecified chronic kidney disease: Secondary | ICD-10-CM | POA: Diagnosis not present

## 2024-03-21 DIAGNOSIS — N390 Urinary tract infection, site not specified: Secondary | ICD-10-CM | POA: Diagnosis not present

## 2024-03-21 DIAGNOSIS — M069 Rheumatoid arthritis, unspecified: Secondary | ICD-10-CM | POA: Diagnosis not present

## 2024-03-21 DIAGNOSIS — N184 Chronic kidney disease, stage 4 (severe): Secondary | ICD-10-CM | POA: Diagnosis not present

## 2024-03-22 DIAGNOSIS — E871 Hypo-osmolality and hyponatremia: Secondary | ICD-10-CM | POA: Diagnosis not present

## 2024-03-22 DIAGNOSIS — I129 Hypertensive chronic kidney disease with stage 1 through stage 4 chronic kidney disease, or unspecified chronic kidney disease: Secondary | ICD-10-CM | POA: Diagnosis not present

## 2024-03-22 DIAGNOSIS — N184 Chronic kidney disease, stage 4 (severe): Secondary | ICD-10-CM | POA: Diagnosis not present

## 2024-03-22 DIAGNOSIS — M069 Rheumatoid arthritis, unspecified: Secondary | ICD-10-CM | POA: Diagnosis not present

## 2024-03-23 DIAGNOSIS — N1832 Chronic kidney disease, stage 3b: Secondary | ICD-10-CM | POA: Diagnosis not present

## 2024-03-23 DIAGNOSIS — M069 Rheumatoid arthritis, unspecified: Secondary | ICD-10-CM | POA: Diagnosis not present

## 2024-03-23 DIAGNOSIS — M6281 Muscle weakness (generalized): Secondary | ICD-10-CM | POA: Diagnosis not present

## 2024-03-24 DIAGNOSIS — M069 Rheumatoid arthritis, unspecified: Secondary | ICD-10-CM | POA: Diagnosis not present

## 2024-03-24 DIAGNOSIS — I120 Hypertensive chronic kidney disease with stage 5 chronic kidney disease or end stage renal disease: Secondary | ICD-10-CM | POA: Diagnosis not present

## 2024-03-24 DIAGNOSIS — N179 Acute kidney failure, unspecified: Secondary | ICD-10-CM | POA: Diagnosis not present

## 2024-03-24 DIAGNOSIS — D631 Anemia in chronic kidney disease: Secondary | ICD-10-CM | POA: Diagnosis not present

## 2024-03-25 DIAGNOSIS — D631 Anemia in chronic kidney disease: Secondary | ICD-10-CM | POA: Diagnosis not present

## 2024-03-25 DIAGNOSIS — N179 Acute kidney failure, unspecified: Secondary | ICD-10-CM | POA: Diagnosis not present

## 2024-03-25 DIAGNOSIS — I120 Hypertensive chronic kidney disease with stage 5 chronic kidney disease or end stage renal disease: Secondary | ICD-10-CM | POA: Diagnosis not present

## 2024-03-25 DIAGNOSIS — M069 Rheumatoid arthritis, unspecified: Secondary | ICD-10-CM | POA: Diagnosis not present

## 2024-03-28 ENCOUNTER — Inpatient Hospital Stay (HOSPITAL_COMMUNITY)
Admission: EM | Admit: 2024-03-28 | Discharge: 2024-04-09 | DRG: 683 | Disposition: A | Discharge status: Skilled Nursing Facility | Source: Skilled Nursing Facility | Attending: Family Medicine | Admitting: Family Medicine

## 2024-03-28 ENCOUNTER — Other Ambulatory Visit: Payer: Self-pay

## 2024-03-28 ENCOUNTER — Emergency Department (HOSPITAL_COMMUNITY)

## 2024-03-28 ENCOUNTER — Encounter (HOSPITAL_COMMUNITY): Payer: Self-pay | Admitting: *Deleted

## 2024-03-28 DIAGNOSIS — N179 Acute kidney failure, unspecified: Principal | ICD-10-CM | POA: Diagnosis present

## 2024-03-28 DIAGNOSIS — B9620 Unspecified Escherichia coli [E. coli] as the cause of diseases classified elsewhere: Secondary | ICD-10-CM | POA: Diagnosis present

## 2024-03-28 DIAGNOSIS — Z886 Allergy status to analgesic agent status: Secondary | ICD-10-CM

## 2024-03-28 DIAGNOSIS — Z6825 Body mass index (BMI) 25.0-25.9, adult: Secondary | ICD-10-CM

## 2024-03-28 DIAGNOSIS — K219 Gastro-esophageal reflux disease without esophagitis: Secondary | ICD-10-CM | POA: Diagnosis present

## 2024-03-28 DIAGNOSIS — Z1612 Extended spectrum beta lactamase (ESBL) resistance: Secondary | ICD-10-CM | POA: Diagnosis present

## 2024-03-28 DIAGNOSIS — K5730 Diverticulosis of large intestine without perforation or abscess without bleeding: Secondary | ICD-10-CM | POA: Diagnosis not present

## 2024-03-28 DIAGNOSIS — Z8673 Personal history of transient ischemic attack (TIA), and cerebral infarction without residual deficits: Secondary | ICD-10-CM

## 2024-03-28 DIAGNOSIS — Z803 Family history of malignant neoplasm of breast: Secondary | ICD-10-CM

## 2024-03-28 DIAGNOSIS — Z743 Need for continuous supervision: Secondary | ICD-10-CM | POA: Diagnosis not present

## 2024-03-28 DIAGNOSIS — M069 Rheumatoid arthritis, unspecified: Secondary | ICD-10-CM | POA: Diagnosis present

## 2024-03-28 DIAGNOSIS — I517 Cardiomegaly: Secondary | ICD-10-CM | POA: Diagnosis not present

## 2024-03-28 DIAGNOSIS — R29898 Other symptoms and signs involving the musculoskeletal system: Secondary | ICD-10-CM | POA: Diagnosis not present

## 2024-03-28 DIAGNOSIS — A419 Sepsis, unspecified organism: Secondary | ICD-10-CM | POA: Diagnosis not present

## 2024-03-28 DIAGNOSIS — N3000 Acute cystitis without hematuria: Secondary | ICD-10-CM | POA: Diagnosis not present

## 2024-03-28 DIAGNOSIS — N136 Pyonephrosis: Secondary | ICD-10-CM | POA: Diagnosis present

## 2024-03-28 DIAGNOSIS — T83511A Infection and inflammatory reaction due to indwelling urethral catheter, initial encounter: Secondary | ICD-10-CM | POA: Diagnosis not present

## 2024-03-28 DIAGNOSIS — E46 Unspecified protein-calorie malnutrition: Secondary | ICD-10-CM | POA: Diagnosis not present

## 2024-03-28 DIAGNOSIS — B961 Klebsiella pneumoniae [K. pneumoniae] as the cause of diseases classified elsewhere: Secondary | ICD-10-CM | POA: Diagnosis not present

## 2024-03-28 DIAGNOSIS — E872 Acidosis, unspecified: Secondary | ICD-10-CM | POA: Diagnosis present

## 2024-03-28 DIAGNOSIS — M797 Fibromyalgia: Secondary | ICD-10-CM | POA: Diagnosis present

## 2024-03-28 DIAGNOSIS — Z90710 Acquired absence of both cervix and uterus: Secondary | ICD-10-CM

## 2024-03-28 DIAGNOSIS — R531 Weakness: Secondary | ICD-10-CM | POA: Diagnosis not present

## 2024-03-28 DIAGNOSIS — N1330 Unspecified hydronephrosis: Secondary | ICD-10-CM

## 2024-03-28 DIAGNOSIS — N185 Chronic kidney disease, stage 5: Secondary | ICD-10-CM | POA: Diagnosis present

## 2024-03-28 DIAGNOSIS — I700 Atherosclerosis of aorta: Secondary | ICD-10-CM | POA: Diagnosis present

## 2024-03-28 DIAGNOSIS — I120 Hypertensive chronic kidney disease with stage 5 chronic kidney disease or end stage renal disease: Secondary | ICD-10-CM | POA: Diagnosis not present

## 2024-03-28 DIAGNOSIS — E440 Moderate protein-calorie malnutrition: Secondary | ICD-10-CM | POA: Diagnosis not present

## 2024-03-28 DIAGNOSIS — R112 Nausea with vomiting, unspecified: Secondary | ICD-10-CM | POA: Diagnosis not present

## 2024-03-28 DIAGNOSIS — Z993 Dependence on wheelchair: Secondary | ICD-10-CM

## 2024-03-28 DIAGNOSIS — Z8249 Family history of ischemic heart disease and other diseases of the circulatory system: Secondary | ICD-10-CM

## 2024-03-28 DIAGNOSIS — Z91018 Allergy to other foods: Secondary | ICD-10-CM

## 2024-03-28 DIAGNOSIS — D539 Nutritional anemia, unspecified: Secondary | ICD-10-CM | POA: Diagnosis present

## 2024-03-28 DIAGNOSIS — E876 Hypokalemia: Secondary | ICD-10-CM | POA: Diagnosis present

## 2024-03-28 DIAGNOSIS — Z887 Allergy status to serum and vaccine status: Secondary | ICD-10-CM

## 2024-03-28 DIAGNOSIS — Y846 Urinary catheterization as the cause of abnormal reaction of the patient, or of later complication, without mention of misadventure at the time of the procedure: Secondary | ICD-10-CM | POA: Diagnosis present

## 2024-03-28 DIAGNOSIS — Z881 Allergy status to other antibiotic agents status: Secondary | ICD-10-CM

## 2024-03-28 DIAGNOSIS — Z91041 Radiographic dye allergy status: Secondary | ICD-10-CM

## 2024-03-28 DIAGNOSIS — R509 Fever, unspecified: Secondary | ICD-10-CM | POA: Diagnosis not present

## 2024-03-28 DIAGNOSIS — Z888 Allergy status to other drugs, medicaments and biological substances status: Secondary | ICD-10-CM

## 2024-03-28 DIAGNOSIS — N139 Obstructive and reflux uropathy, unspecified: Secondary | ICD-10-CM | POA: Insufficient documentation

## 2024-03-28 DIAGNOSIS — R4589 Other symptoms and signs involving emotional state: Secondary | ICD-10-CM | POA: Diagnosis not present

## 2024-03-28 DIAGNOSIS — E669 Obesity, unspecified: Secondary | ICD-10-CM | POA: Diagnosis present

## 2024-03-28 DIAGNOSIS — N390 Urinary tract infection, site not specified: Secondary | ICD-10-CM | POA: Diagnosis present

## 2024-03-28 DIAGNOSIS — D649 Anemia, unspecified: Secondary | ICD-10-CM | POA: Diagnosis not present

## 2024-03-28 DIAGNOSIS — R0682 Tachypnea, not elsewhere classified: Secondary | ICD-10-CM | POA: Diagnosis not present

## 2024-03-28 DIAGNOSIS — Z7902 Long term (current) use of antithrombotics/antiplatelets: Secondary | ICD-10-CM | POA: Diagnosis not present

## 2024-03-28 DIAGNOSIS — F419 Anxiety disorder, unspecified: Secondary | ICD-10-CM | POA: Diagnosis present

## 2024-03-28 DIAGNOSIS — N189 Chronic kidney disease, unspecified: Secondary | ICD-10-CM | POA: Diagnosis not present

## 2024-03-28 DIAGNOSIS — Z79899 Other long term (current) drug therapy: Secondary | ICD-10-CM

## 2024-03-28 DIAGNOSIS — R918 Other nonspecific abnormal finding of lung field: Secondary | ICD-10-CM | POA: Diagnosis not present

## 2024-03-28 DIAGNOSIS — E869 Volume depletion, unspecified: Secondary | ICD-10-CM | POA: Diagnosis not present

## 2024-03-28 DIAGNOSIS — E8809 Other disorders of plasma-protein metabolism, not elsewhere classified: Secondary | ICD-10-CM | POA: Diagnosis present

## 2024-03-28 DIAGNOSIS — I10 Essential (primary) hypertension: Secondary | ICD-10-CM | POA: Diagnosis not present

## 2024-03-28 DIAGNOSIS — Z885 Allergy status to narcotic agent status: Secondary | ICD-10-CM

## 2024-03-28 DIAGNOSIS — R109 Unspecified abdominal pain: Secondary | ICD-10-CM | POA: Diagnosis not present

## 2024-03-28 DIAGNOSIS — Z801 Family history of malignant neoplasm of trachea, bronchus and lung: Secondary | ICD-10-CM

## 2024-03-28 DIAGNOSIS — G629 Polyneuropathy, unspecified: Secondary | ICD-10-CM | POA: Diagnosis present

## 2024-03-28 DIAGNOSIS — Z7189 Other specified counseling: Secondary | ICD-10-CM | POA: Diagnosis not present

## 2024-03-28 DIAGNOSIS — N3289 Other specified disorders of bladder: Secondary | ICD-10-CM | POA: Diagnosis not present

## 2024-03-28 DIAGNOSIS — J90 Pleural effusion, not elsewhere classified: Secondary | ICD-10-CM | POA: Diagnosis present

## 2024-03-28 DIAGNOSIS — Z806 Family history of leukemia: Secondary | ICD-10-CM

## 2024-03-28 DIAGNOSIS — I129 Hypertensive chronic kidney disease with stage 1 through stage 4 chronic kidney disease, or unspecified chronic kidney disease: Secondary | ICD-10-CM | POA: Diagnosis not present

## 2024-03-28 DIAGNOSIS — F32A Depression, unspecified: Secondary | ICD-10-CM | POA: Diagnosis present

## 2024-03-28 DIAGNOSIS — B3749 Other urogenital candidiasis: Secondary | ICD-10-CM | POA: Diagnosis present

## 2024-03-28 DIAGNOSIS — R7889 Finding of other specified substances, not normally found in blood: Secondary | ICD-10-CM | POA: Diagnosis not present

## 2024-03-28 DIAGNOSIS — B9629 Other Escherichia coli [E. coli] as the cause of diseases classified elsewhere: Secondary | ICD-10-CM

## 2024-03-28 DIAGNOSIS — R0689 Other abnormalities of breathing: Secondary | ICD-10-CM | POA: Diagnosis not present

## 2024-03-28 DIAGNOSIS — Z515 Encounter for palliative care: Secondary | ICD-10-CM | POA: Diagnosis not present

## 2024-03-28 DIAGNOSIS — Z880 Allergy status to penicillin: Secondary | ICD-10-CM

## 2024-03-28 DIAGNOSIS — Z823 Family history of stroke: Secondary | ICD-10-CM

## 2024-03-28 DIAGNOSIS — N1832 Chronic kidney disease, stage 3b: Secondary | ICD-10-CM | POA: Diagnosis not present

## 2024-03-28 DIAGNOSIS — Z91013 Allergy to seafood: Secondary | ICD-10-CM

## 2024-03-28 LAB — CBC WITH DIFFERENTIAL/PLATELET
Abs Immature Granulocytes: 0.22 K/uL — ABNORMAL HIGH (ref 0.00–0.07)
Basophils Absolute: 0.2 K/uL — ABNORMAL HIGH (ref 0.0–0.1)
Basophils Relative: 2 %
Eosinophils Absolute: 0.1 K/uL (ref 0.0–0.5)
Eosinophils Relative: 1 %
HCT: 29.4 % — ABNORMAL LOW (ref 36.0–46.0)
Hemoglobin: 8.9 g/dL — ABNORMAL LOW (ref 12.0–15.0)
Immature Granulocytes: 2 %
Lymphocytes Relative: 14 %
Lymphs Abs: 1.3 K/uL (ref 0.7–4.0)
MCH: 30.5 pg (ref 26.0–34.0)
MCHC: 30.3 g/dL (ref 30.0–36.0)
MCV: 100.7 fL — ABNORMAL HIGH (ref 80.0–100.0)
Monocytes Absolute: 0.6 K/uL (ref 0.1–1.0)
Monocytes Relative: 7 %
Neutro Abs: 6.9 K/uL (ref 1.7–7.7)
Neutrophils Relative %: 74 %
Platelets: 465 K/uL — ABNORMAL HIGH (ref 150–400)
RBC: 2.92 MIL/uL — ABNORMAL LOW (ref 3.87–5.11)
RDW: 15.6 % — ABNORMAL HIGH (ref 11.5–15.5)
WBC: 9.2 K/uL (ref 4.0–10.5)
nRBC: 0 % (ref 0.0–0.2)

## 2024-03-28 LAB — COMPREHENSIVE METABOLIC PANEL WITH GFR
ALT: <5 U/L (ref 0–44)
AST: <10 U/L — ABNORMAL LOW (ref 15–41)
Albumin: 2.7 g/dL — ABNORMAL LOW (ref 3.5–5.0)
Alkaline Phosphatase: 143 U/L — ABNORMAL HIGH (ref 38–126)
Anion gap: 13 (ref 5–15)
BUN: 35 mg/dL — ABNORMAL HIGH (ref 8–23)
CO2: 15 mmol/L — ABNORMAL LOW (ref 22–32)
Calcium: 7.9 mg/dL — ABNORMAL LOW (ref 8.9–10.3)
Chloride: 108 mmol/L (ref 98–111)
Creatinine, Ser: 3.98 mg/dL — ABNORMAL HIGH (ref 0.44–1.00)
GFR, Estimated: 11 mL/min — ABNORMAL LOW (ref >60)
Glucose, Bld: 116 mg/dL — ABNORMAL HIGH (ref 70–99)
Potassium: 4.2 mmol/L (ref 3.5–5.1)
Sodium: 135 mmol/L (ref 135–145)
Total Bilirubin: <0.2 mg/dL (ref 0.0–1.2)
Total Protein: 6 g/dL — ABNORMAL LOW (ref 6.5–8.1)

## 2024-03-28 LAB — URINALYSIS, W/ REFLEX TO CULTURE (INFECTION SUSPECTED)
Bilirubin Urine: NEGATIVE
Glucose, UA: NEGATIVE mg/dL
Hgb urine dipstick: NEGATIVE
Ketones, ur: NEGATIVE mg/dL
Nitrite: NEGATIVE
Protein, ur: 100 mg/dL — AB
Specific Gravity, Urine: 1.009 (ref 1.005–1.030)
WBC, UA: >50 WBC/hpf (ref 0–5)
pH: 5 (ref 5.0–8.0)

## 2024-03-28 LAB — LIPASE, BLOOD: Lipase: 19 U/L (ref 11–51)

## 2024-03-28 MED ORDER — SODIUM CHLORIDE 0.9 % IV BOLUS
1000.0000 mL | Freq: Once | INTRAVENOUS | Status: AC
  Administered 2024-03-28: 1000 mL via INTRAVENOUS

## 2024-03-28 MED ORDER — SODIUM CHLORIDE 0.9 % IV SOLN
1.0000 g | Freq: Two times a day (BID) | INTRAVENOUS | Status: DC
  Administered 2024-03-28 – 2024-03-31 (×6): 1 g via INTRAVENOUS
  Filled 2024-03-28 (×6): qty 20

## 2024-03-28 MED ORDER — SODIUM CHLORIDE 0.9 % IV SOLN: 1.0000 g | Freq: Three times a day (TID) | INTRAVENOUS | Status: DC

## 2024-03-28 MED ORDER — ONDANSETRON HCL 4 MG/2ML IJ SOLN
4.0000 mg | Freq: Once | INTRAMUSCULAR | Status: AC
  Administered 2024-03-28: 4 mg via INTRAVENOUS
  Filled 2024-03-28: qty 2

## 2024-03-28 NOTE — ED Notes (Signed)
 MD Francesca states that we can pull labs and give meds through picc line that was placed at a facility.

## 2024-03-28 NOTE — ED Provider Notes (Signed)
 [place] EMERGENCY DEPARTMENT AT [facility] Provider Note  CSN: [id number] Arrival date & time: 03/28/24 1405  Chief Complaint(s) abnormal labs  HPI Robin Blackburn is a 78 y.o. female history of prior stroke, rheumatoid arthritis, hypertension, recent admission for bladder outlet obstruction, AKI presenting for abnormal lab.  Patient reports that they sent her because her kidney function was bad.  She denies any complaints related to her urinary catheter, reports it is draining urine.  She does report some upper abdominal pain, nausea.  No chest pain or shortness of breath.  No cough.  No pain in the back or flanks.  No vomiting.  She reports generalized weakness and lightheadedness.  Currently residing at nursing facility.   Past Medical History 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   Depression    Fibromyalgia    Hypertension    Neuropathy    Rheumatoid arthritis (HCC)    Stroke (HCC) 02/17/14   Patient Active Problem List   Diagnosis Date Noted   Acute kidney injury superimposed on stage 5 chronic kidney disease, not on chronic dialysis (HCC) 03/28/2024   Dysarthria 03/14/2024   Acute-on-chronic kidney injury 03/10/2024   Hyperkalemia 03/10/2024   Bladder outlet obstruction 03/10/2024   Pneumonia 02/18/2024   Urinary tract infection 01/04/2024   Pulmonary nodules 01/27/2023   Bilateral hearing loss 07/01/2022   Chronic ear pain, bilateral 07/01/2022   Spinal stenosis of lumbar region 11/22/2021   OAB (overactive bladder) 05/21/2020   Osteoarthritis of right knee 06/01/2019   Weakness    Recurrent UTI 11/25/2016   Benzodiazepine withdrawal with perceptual disturbance (HCC) 04/02/2016   Osteoporosis 11/01/2015   Vitamin D  deficiency 10/11/2015   History of fracture of left ankle 09/28/2015   Chronic pain in left foot 09/28/2015   Fibromyalgia syndrome 09/28/2015   GERD  (gastroesophageal reflux disease) 04/05/2015   Diarrhea 01/05/2015   Chronic pain syndrome 12/02/2014   Back pain, lumbosacral 06/23/2014   Sinusitis, chronic 06/17/2014   H/O: CVA (cerebrovascular accident) 05/24/2014   HLD (hyperlipidemia) 05/24/2014   Physical deconditioning 05/24/2014   Right sided weakness 02/26/2014   Rheumatoid arthritis (HCC) 02/20/2014   Essential hypertension 02/19/2014   Neuropathy 02/19/2014   Anxiety 02/19/2014   Home Medication(s) Prior to Admission medications   Medication Sig Start Date End Date Taking? Authorizing Provider  Adalimumab (HUMIRA, 2 PEN,) 40 MG/0.8ML PNKT Inject 40 mg into the skin every 14 (fourteen) days.    [provider]  ALPRAZolam  (XANAX ) 0.5 MG tablet Take 1 tablet (0.5 mg total) by mouth 2 (two) times daily as needed for anxiety. 03/15/24   Amin, Ankit C, MD  amLODipine  (NORVASC ) 2.5 MG tablet Take 2.5 mg by mouth daily. Hold for SBP<110 12/10/23   [provider]  benzonatate (TESSALON) 200 MG capsule Take 200 mg by mouth every 8 (eight) hours as needed for cough.    [provider]  chlorhexidine  (PERIDEX ) 0.12 % solution Use as directed 15 mLs in the mouth or throat 2 (two) times daily.    [provider]  Cholecalciferol  5000 units TABS Take 1 tablet by mouth daily.     [provider]  Cranberry 450 MG TABS Take 1 tablet by mouth in the morning and at bedtime. For UTI    [provider]  cycloSPORINE  (RESTASIS ) 0.05 % ophthalmic emulsion Place 1 drop into both eyes  2 (two) times daily. Dry eyes    [provider]  Dextromethorphan-guaiFENesin (MUCINEX DM MAXIMUM STRENGTH) 60-1200 MG TB12 Take 1 tablet by mouth every 12 (twelve) hours as needed (cough/congestion).    [provider]  dicyclomine  (BENTYL ) 20 MG tablet Take 20 mg by mouth every 6 (six) hours. Abdominal cramps 01/31/14   [provider]  estradiol  (ESTRACE ) 0.1 MG/GM vaginal cream Place 1  Applicatorful vaginally every other day. Discard plastic applicator. Insert a blueberry size amount (approximately 1 gram) of cream on fingertip inside vagina at bedtime every other night for long term use. Patient taking differently: Place 1 Applicatorful vaginally See admin instructions. Every Tuesday and Friday. Discard plastic applicator. Insert a blueberry size amount (approximately 1 gram) of cream on fingertip inside vagina at bedtime every other night for long term use. 01/09/24   [name], MD  ferrous sulfate  325 (65 FE) MG EC tablet Take 1 tablet (325 mg total) by mouth daily with breakfast. Patient not taking: Reported on 03/10/2024 02/04/24   Thayil, Irene T, PA-C  fluticasone (FLONASE) 50 MCG/ACT nasal spray Place 1 spray into both nostrils in the morning and at bedtime.    [provider]  folic acid  (FOLVITE ) 1 MG tablet Take 1 mg by mouth daily.    [provider]  gabapentin  (NEURONTIN ) 300 MG capsule Take 1 capsule (300 mg total) by mouth 3 (three) times daily as needed (neuropathy pain symptoms). Patient taking differently: Take 300 mg by mouth 3 (three) times daily. 01/09/24   [name], [name], MD  guaiFENesin-dextromethorphan (ROBITUSSIN DM) 100-10 MG/5ML syrup Take 10 mLs by mouth at bedtime.    [provider]  lansoprazole (PREVACID) 30 MG capsule Take 60 mg by mouth 2 (two) times daily before a meal. DO NOT CHANGE PER MD    [provider]  leflunomide  (ARAVA ) 10 MG tablet Take 10 mg by mouth daily. 08/01/20   [provider]  Melatonin 10 MG TABS Take 10 mg by mouth at bedtime.    [provider]  methenamine  (HIPREX ) 1 g tablet Take 1 tablet (1 g total) by mouth 2 (two) times daily. Prophylactic 01/22/24   [name], [name], MD  methocarbamol  (ROBAXIN ) 500 MG tablet Take 1 tablet (500 mg total) by mouth every 8 (eight) hours as needed for muscle spasms. 03/15/24   Amin, Ankit C, MD  metoprolol  tartrate (LOPRESSOR )  25 MG tablet Take 0.5 tablets (12.5 mg total) by mouth 2 (two) times daily. 01/09/24   [name], MD  Multiple Vitamins-Minerals (PRESERVISION AREDS 2) CAPS Take 1 capsule by mouth in the morning and at bedtime.    [provider]  Nutritional Supplement LIQD Take 120 mLs by mouth daily at 6 PM. House supplement on [facility]    [provider]  ondansetron  (ZOFRAN ) 4 MG tablet Take 4 mg by mouth every 8 (eight) hours as needed for vomiting or nausea. 02/13/24   [provider]  phenazopyridine  (PYRIDIUM ) 200 MG tablet Take 200 mg by mouth every 8 (eight) hours as needed (dysuria). 12/28/23   [provider]  polyethylene glycol (MIRALAX  / GLYCOLAX ) 17 g packet Take 17 g by mouth daily as needed for mild constipation. 03/15/24   Amin, Ankit C, MD  Probiotic, Lactobacillus, CAPS Take 1 capsule by mouth in the morning, at noon, and at bedtime.    [provider]  senna (SENOKOT) 8.6 MG tablet Take 1 tablet by mouth 2 (two) times daily.  [provider]  sodium chloride  0.9 % infusion Inject 10 mLs into the vein daily. Use 10 mL intravenously one time a day for flush upper right arm picc line 02/12/24   [provider]  sodium zirconium cyclosilicate  (LOKELMA ) 10 g PACK packet Take 10 g by mouth 3 (three) times daily.    [provider]  ticagrelor  (BRILINTA ) 60 MG TABS tablet Take 60 mg by mouth daily.    [provider]  torsemide  (DEMADEX ) 20 MG tablet Take 20 mg by mouth daily. 08/31/23   [provider]  traMADol  (ULTRAM ) 50 MG tablet Take 1 tablet (50 mg total) by mouth every 8 (eight) hours as needed for severe pain (pain score 7-10). 03/15/24   Amin, Ankit C, MD  traZODone  (DESYREL ) 100 MG tablet Take 100 mg by mouth at bedtime. 02/25/23   [provider]  vitamin B-12 (CYANOCOBALAMIN ) 250 MCG tablet Take 250 mcg by mouth daily.    [provider]                                                                                                                                     Past Surgical History Past Surgical History:  Procedure Laterality Date   ABDOMINAL HYSTERECTOMY     ANKLE RECONSTRUCTION     APPENDECTOMY     BACK SURGERY     CHOLECYSTECTOMY     KNEE SURGERY     Family History Family History  Problem Relation Age of Onset   Breast cancer Mother    Hypertension Father    Transient ischemic attack Father    Lung cancer Father    Hypertension Brother    Leukemia Brother    Leukemia Brother    Leukemia Brother    CVA Maternal Grandfather     Social History Social History   Tobacco Use   Smoking status: Never    Passive exposure: Never   Smokeless tobacco: Never  Vaping Use   Vaping status: Never Used  Substance Use Topics   Alcohol  use: No   Drug use: No   Allergies Cortisone, Fentanyl , Hydromorphone , Iodinated contrast media, Keflex  [cephalexin ], Meperidine, Morphine, Oxycodone , Oxycodone -acetaminophen , Penicillins, Afluria preservative free [influenza virus vacc split pf], Aspirin , Ciprofloxacin  hcl, Codeine, Influenza vaccines, Influenza virus vaccine, Iohexol, Meperidine hcl, Misc. sulfonamide containing compounds, Oxycodone  hcl, Penicillamine, Shellfish allergy, Sulfa  antibiotics, Troleandomycin, Potassium chloride , Bisphosphonates, Ciprofloxacin , Iodine, Levaquin [levofloxacin in d5w], Levofloxacin, Morphine and codeine, Oxytetracycline, Percocet [oxycodone -acetaminophen ], and Sulfur  Review of Systems Review of Systems  All other systems reviewed and are negative.   Physical Exam Vital Signs  I have reviewed the triage vital signs BP (!) 148/61   Pulse 86   Temp 98.4 F (36.9 C)   Resp 18   Ht 5' 6 (1.676 m)   Wt 72 kg   SpO2 95%   BMI 25.62 kg/m  Physical Exam Vitals and nursing note reviewed.  Constitutional:      General: She is not in acute distress.    Appearance: She is well-developed.  HENT:     Head: Normocephalic and  atraumatic.     Mouth/Throat:     Mouth: Mucous membranes are moist.  Eyes:     Pupils: Pupils are equal, round, and reactive to light.  Cardiovascular:     Rate and Rhythm: Normal rate and regular rhythm.     Heart sounds: No murmur heard. Pulmonary:     Effort: Pulmonary effort is normal. No respiratory distress.     Breath sounds: Normal breath sounds.  Abdominal:     General: Abdomen is flat.     Palpations: Abdomen is soft.     Tenderness: There is abdominal tenderness (epigastric tenderness). There is no right CVA tenderness or left CVA tenderness.  Musculoskeletal:        General: No tenderness.     Right lower leg: No edema.     Left lower leg: No edema.  Skin:    General: Skin is warm and dry.  Neurological:     General: No focal deficit present.     Mental Status: She is alert. Mental status is at baseline.  Psychiatric:        Mood and Affect: Mood normal.        Behavior: Behavior normal.     ED Results and Treatments Labs (all labs ordered are listed, but only abnormal results are displayed) Labs Reviewed  COMPREHENSIVE METABOLIC PANEL WITH GFR - Abnormal; Notable for the following components:      Result Value   CO2 15 (*)    Glucose, Bld 116 (*)    BUN 35 (*)    Creatinine, Ser 3.98 (*)    Calcium  7.9 (*)    Total Protein 6.0 (*)    Albumin 2.7 (*)    AST <10 (*)    Alkaline Phosphatase 143 (*)    GFR, Estimated 11 (*)    All other components within normal limits  CBC WITH DIFFERENTIAL/PLATELET - Abnormal; Notable for the following components:   RBC 2.92 (*)    Hemoglobin 8.9 (*)    HCT 29.4 (*)    MCV 100.7 (*)    RDW 15.6 (*)    Platelets 465 (*)    Basophils Absolute 0.2 (*)    Abs Immature Granulocytes 0.22 (*)    All other components within normal limits  URINALYSIS, W/ REFLEX TO CULTURE (INFECTION SUSPECTED) - Abnormal; Notable for the following components:   APPearance HAZY (*)    Protein, ur 100 (*)    Leukocytes,Ua MODERATE (*)     Bacteria, UA RARE (*)    All other components within normal limits  URINE CULTURE  LIPASE, BLOOD                                                                                                                          Radiology CT ABDOMEN PELVIS  WO CONTRAST Result Date: 03/28/2024 CLINICAL DATA:  Acute abdominal pain EXAM: CT ABDOMEN AND PELVIS WITHOUT CONTRAST TECHNIQUE: Multidetector CT imaging of the abdomen and pelvis was performed following the standard protocol without IV contrast. RADIATION DOSE REDUCTION: This exam was performed according to the departmental dose-optimization program which includes automated exposure control, adjustment of the mA and/or kV according to patient size and/or use of iterative reconstruction technique. COMPARISON:  03/10/2024 FINDINGS: Lower chest: There are bilateral pleural effusions, right greater than left, increased since prior study. Compressive atelectasis within the lower lobes. Hepatobiliary: Cholecystectomy. Unremarkable unenhanced appearance of the liver. Pancreas: Unremarkable unenhanced appearance. Spleen: Unremarkable unenhanced appearance. Adrenals/Urinary Tract: The bladder is decompressed with a Foley catheter. Persistent bladder wall thickening and perivesicular fat stranding. Stable bilateral hydroureteronephrosis. Stable mucosal thickening of the renal pelves and ureters concerning for urinary tract infection. No urinary tract calculi. The adrenals are unremarkable. Stomach/Bowel: No bowel obstruction or ileus. Distal colonic diverticulosis without diverticulitis. No bowel wall thickening or inflammatory change. Vascular/Lymphatic: Aortic atherosclerosis. No enlarged abdominal or pelvic lymph nodes. Reproductive: Status post hysterectomy. No adnexal masses. Other: No free fluid or free intraperitoneal gas. No abdominal wall hernia. Musculoskeletal: No acute or destructive bony abnormalities. Reconstructed images demonstrate no additional findings.  IMPRESSION: 1. Stable bilateral hydroureteronephrosis. 2. Stable mucosal thickening of the renal pelves and bilateral ureters, as well as diffuse bladder wall thickening and perivesicular fat stranding, concerning for cystitis and urinary tract infection. 3. Bilateral pleural effusions, right greater than left, increased since prior exam. 4.  Aortic Atherosclerosis (ICD10-I70.0). Electronically Signed   By: Ozell Daring M.D.   On: 03/28/2024 18:12    Pertinent labs & imaging results that were available during my care of the patient were reviewed by me and considered in my medical decision making (see MDM for details).  Medications Ordered in ED Medications  meropenem  (MERREM ) 1 g in sodium chloride  0.9 % 100 mL IVPB (has no administration in time range)  sodium chloride  0.9 % bolus 1,000 mL (1,000 mLs Intravenous Bolus 03/28/24 1609)  ondansetron  (ZOFRAN ) injection 4 mg (4 mg Intravenous Given 03/28/24 1610)                                                                                                                                     Procedures Procedures  (including critical care time)  Medical Decision Making / ED Course   MDM:  78 year old presenting with abnormal lab.  Patient chronically ill-appearing but no acute distress.  Examination with some epigastric tenderness, no CVA tenderness or lower abdominal tenderness.  Urinary catheter appears to be draining yellow urine.  Reviewed labs from earlier today, appear to show roughly stable kidney function, anemia.  Does not seem to be significantly worsening, but will repeat lab test here.  Lower concern for persistent obstruction.  Patient denies secondary symptoms of UTI like lower abdominal pain or fevers.  She does have some  upper abdominal pain and tenderness which she reports is new so we will check CT abdomen pelvis without contrast.  She request medication for nausea we will give this also.  Differential includes obstruction,  perforation, recurrent urinary obstruction, pancreatitis, cholecystitis.  Will reassess.  If laboratory testing and imaging is stable, patient can likely be discharged with continued outpatient follow-up.  Clinical Course as of 03/28/24 2114  Mon Mar 28, 2024  8161 CT scan shows persistent hydroureteronephrosis with bladder wall thickening concerning for ongoing infectious process.  Will exchange Foley catheter and obtain repeat UA.  Discussed with Dr. Sherrilee, given rising creatinine recommends admission to hospitalist and observation, they will consult and try to determine whether patient would benefit from nephrostomy tube or stenting.  He reports that patient had previously refused nephrostomy tubes. [WS]  2113 UTI does appear to be present on fresh urinary catheter.  Culture has been sent.  Patient has had ESBL infection.  She will be started on meropenem .  Discussed with Dr. Manfred who will admit patient. [WS]    Clinical Course User Index [WS] Francesca Elsie CROME, MD     Additional history obtained: -Additional history obtained from ems -External records from outside source obtained and reviewed including: Chart review including previous notes, labs, imaging, consultation notes including prior admission   Lab Tests: -I ordered, reviewed, and interpreted labs.   The pertinent results include:   Labs Reviewed  COMPREHENSIVE METABOLIC PANEL WITH GFR - Abnormal; Notable for the following components:      Result Value   CO2 15 (*)    Glucose, Bld 116 (*)    BUN 35 (*)    Creatinine, Ser 3.98 (*)    Calcium  7.9 (*)    Total Protein 6.0 (*)    Albumin 2.7 (*)    AST <10 (*)    Alkaline Phosphatase 143 (*)    GFR, Estimated 11 (*)    All other components within normal limits  CBC WITH DIFFERENTIAL/PLATELET - Abnormal; Notable for the following components:   RBC 2.92 (*)    Hemoglobin 8.9 (*)    HCT 29.4 (*)    MCV 100.7 (*)    RDW 15.6 (*)    Platelets 465 (*)    Basophils  Absolute 0.2 (*)    Abs Immature Granulocytes 0.22 (*)    All other components within normal limits  URINALYSIS, W/ REFLEX TO CULTURE (INFECTION SUSPECTED) - Abnormal; Notable for the following components:   APPearance HAZY (*)    Protein, ur 100 (*)    Leukocytes,Ua MODERATE (*)    Bacteria, UA RARE (*)    All other components within normal limits  URINE CULTURE  LIPASE, BLOOD    Notable for AKI on CKD, acidosis due to CKD   Imaging Studies ordered: I ordered imaging studies including CT scan  On my interpretation imaging demonstrates bilateral hydronephrosis I independently visualized and interpreted imaging. I agree with the radiologist interpretation   Medicines ordered and prescription drug management: Meds ordered this encounter  Medications   sodium chloride  0.9 % bolus 1,000 mL   ondansetron  (ZOFRAN ) injection 4 mg   DISCONTD: meropenem  (MERREM ) 1 g in sodium chloride  0.9 % 100 mL IVPB    Antibiotic Indication::   ESBL Infection   meropenem  (MERREM ) 1 g in sodium chloride  0.9 % 100 mL IVPB    Antibiotic Indication::   ESBL Infection    -I have reviewed the patients home medicines and have made adjustments as needed  Consultations Obtained: I requested consultation with the urologist Dr. Sherrilee ,  and discussed lab and imaging findings as well as pertinent plan - they recommend: admission   Cardiac Monitoring: The patient was maintained on a cardiac monitor.  I personally viewed and interpreted the cardiac monitored which showed an underlying rhythm of: NSR  Social Determinants of Health:  Diagnosis or treatment significantly limited by social determinants of health: obesity   Reevaluation: After the interventions noted above, I reevaluated the patient and found that their symptoms have improved  Co morbidities that complicate the patient evaluation  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/2014    Acute left thalamic   Depression    Fibromyalgia    Hypertension    Neuropathy    Rheumatoid arthritis (HCC)    Stroke (HCC) 02/17/14      Dispostion: Disposition decision including need for hospitalization was considered, and patient admitted to hospital.    Final Clinical Impression(s) / ED Diagnoses Final diagnoses:  Acute kidney injury superimposed on CKD  Bilateral hydronephrosis  UTI due to extended-spectrum beta lactamase (ESBL) producing Escherichia coli     This chart was dictated using voice recognition software.  Despite best efforts to proofread,  errors can occur which can change the documentation meaning.    Francesca Elsie CROME, MD 03/28/24 2115

## 2024-03-28 NOTE — H&P (Signed)
 History and Physical    Patient: Robin Blackburn FMW:[id number] DOB: [birth year] DOA: 03/28/2024 DOS: the patient was seen and examined on 03/29/2024 PCP: Bertell Satterfield, MD  Patient coming from: [facility]  Chief Complaint:  Chief Complaint  Patient presents with   abnormal labs   HPI: Robin Blackburn is a 78 y.o. female with medical history significant of hypertension, rheumatoid arthritis, fibromyalgia, chronic pain, prior stroke, bladder outlet obstruction who presents to the emergency department via EMS due to abnormal lab.  Patient states that she was told that her kidney function was worsening, so she was sent to the ED for further evaluation and management.  She complained about nausea generalized weakness and abdominal pain but denies any discomfort with her urinary catheter.  Also denies fever, chills, cough, chest pain, shortness of breath, nausea, vomiting. Patient is wheelchair-bound at baseline.  ED Course:  In the emergency department, BP was 153/75, other vital signs are within normal range.  Workup in the ED shows WBC 9.2, hemoglobin 8.9, hematocrit 35.4, MCV 1.7, platelets 465.  BMP showed sodium 134, potassium 4.2, chloride 108, bicarb 15, blood glucose 116, BUN/creatinine 5/3.98 (baseline creatinine at 1.3-1.8), albumin 2.7, ALP 143.  Urinalysis was suggestive of UTI. CT abdomen and pelvis without contrast showed stable bilateral hydroureteronephrosis showed concern for cystitis and urinary tract infection.  Bilateral pleural effusions, right greater than left, increased since prior exam. Foley catheter was changed, patient was started on IV meropenem , Zofran  was given due to nausea and IV hydration was provided. Urologist (Dr. Sherrilee) was consulted and will see patient to determine if she will benefit from nephrostomy tube or stenting.  TR[facility] was asked to admit patient  Review of Systems: Review of systems as noted in the HPI. All other systems reviewed and are  negative.   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   Depression    Fibromyalgia    Hypertension    Neuropathy    Rheumatoid arthritis (HCC)    Stroke (HCC) 02/17/14   Past Surgical History:  Procedure Laterality Date   ABDOMINAL HYSTERECTOMY     ANKLE RECONSTRUCTION     APPENDECTOMY     BACK SURGERY     CHOLECYSTECTOMY     KNEE SURGERY      Social History:  reports that she has never smoked. She has never been exposed to tobacco smoke. She has never used smokeless tobacco. She reports that she does not drink alcohol  and does not use drugs.   Allergies  Allergen Reactions   Cortisone Anaphylaxis    Cardiovascular Arrest   Fentanyl  Anaphylaxis, Hives and Other (See Comments)    felt like I had demons in my head   Hydromorphone  Anaphylaxis and Nausea And Vomiting    GI Intolerance   Iodinated Contrast Media Anaphylaxis, Hives, Rash and Dermatitis    Respiratory Distress  Iodinated contrast media (substance)   Keflex  [Cephalexin ] Nausea And Vomiting   Meperidine Anaphylaxis and Shortness Of Breath    Respiratory Distress   Morphine Anaphylaxis, Nausea And Vomiting, Swelling and Rash    GI Intolerance   Oxycodone -Acetaminophen  Anaphylaxis, Nausea And Vomiting, Swelling, Dermatitis and Rash    GI Intolerance, Mouth swelling.  acetaminophen  / oxycodone    Penicillins Anaphylaxis, Nausea And Vomiting and Other (See Comments)    Immediate rash, facial/tongue/throat swelling, SOB or lightheadedness with hypotension  Product containing penicillin (product)  Afluria Preservative Free [Influenza Virus Vacc Split Pf] Other (See Comments)    Unknown   Aspirin  Other (See Comments)    GI Intolerance   Codeine Other (See Comments)    Unknown    Influenza Virus Vaccine Other (See Comments)    Unknown    Meperidine Hcl Other (See Comments)    Unknown   Oxycodone  Hcl Other (See Comments)     Unknown   Shellfish Allergy Other (See Comments)    Glucosamine not an option Unknown    Sulfa  Antibiotics Nausea And Vomiting    Tolerates Bactrim  though   Troleandomycin Nausea And Vomiting and Swelling    GI Intolerance   Potassium Chloride  Other (See Comments)    No reaction listed on MAR   Bisphosphonates Hives and Other (See Comments)    GI intolerance   Ciprofloxacin  Nausea Only and Rash   Levaquin [Levofloxacin In D5w] Nausea And Vomiting and Rash    Mental Status Changes, Confusion   Oxytetracycline Nausea And Vomiting    Family History  Problem Relation Age of Onset   Breast cancer Mother    Hypertension Father    Transient ischemic attack Father    Lung cancer Father    Hypertension Brother    Leukemia Brother    Leukemia Brother    Leukemia Brother    CVA Maternal Grandfather      Prior to Admission medications   Medication Sig Start Date End Date Taking? Authorizing Provider  Adalimumab (HUMIRA, 2 PEN,) 40 MG/0.8ML PNKT Inject 40 mg into the skin every 14 (fourteen) days.   Yes [provider]  ALPRAZolam  (XANAX ) 0.5 MG tablet Take 1 tablet (0.5 mg total) by mouth 2 (two) times daily as needed for anxiety. 03/15/24  Yes Amin, Ankit C, MD  amLODipine  (NORVASC ) 2.5 MG tablet Take 2.5 mg by mouth daily. Hold for SBP<110 12/10/23  Yes [provider]  benzonatate (TESSALON) 200 MG capsule Take 200 mg by mouth every 8 (eight) hours as needed for cough.   Yes [provider]  chlorhexidine  (PERIDEX ) 0.12 % solution Use as directed 15 mLs in the mouth or throat 2 (two) times daily.   Yes [provider]  Cholecalciferol  5000 units TABS Take 1 tablet by mouth daily.    Yes [provider]  Cranberry 450 MG TABS Take 1 tablet by mouth in the morning and at bedtime. For UTI   Yes [provider]  cycloSPORINE  (RESTASIS ) 0.05 % ophthalmic emulsion Place 1 drop into both eyes 2 (two) times daily. Dry eyes   Yes [provider]  dextromethorphan-guaiFENesin (MUCINEX DM) 30-600 MG 12hr tablet Take 1 tablet by mouth every 12 (twelve) hours as needed for cough.   Yes [provider]  dicyclomine  (BENTYL ) 20 MG tablet Take 20 mg by mouth every 6 (six) hours. Abdominal cramps 01/31/14  Yes [provider]  estradiol  (ESTRACE ) 0.1 MG/GM vaginal cream Place 1 Applicatorful vaginally every other day. Discard plastic applicator. Insert a blueberry size amount (approximately 1 gram) of cream on fingertip inside vagina at bedtime every other night for long term use. Patient taking differently: Place 1 Applicatorful vaginally every other day. 01/09/24  Yes [name], [name], MD  ferrous sulfate  325 (65 FE) MG EC tablet Take 1 tablet (325 mg total) by mouth daily with breakfast. 02/04/24  Yes [name], PA-C  fluconazole (DIFLUCAN) IVPB Inject 200 mg into the vein daily. 14 Day course starting on 03/25/2024  Yes [provider]  fluticasone (FLONASE) 50 MCG/ACT nasal spray Place 1 spray into both nostrils in the morning and at bedtime.   Yes [provider]  folic acid  (FOLVITE ) 1 MG tablet Take 1 mg by mouth daily.   Yes [provider]  gabapentin  (NEURONTIN ) 300 MG capsule Take 1 capsule (300 mg total) by mouth 3 (three) times daily as needed (neuropathy pain symptoms). Patient taking differently: Take 300 mg by mouth every 8 (eight) hours as needed (for pain). 01/09/24  Yes [name], [name], MD  guaiFENesin-dextromethorphan (ROBITUSSIN DM) 100-10 MG/5ML syrup Take 10 mLs by mouth at bedtime.   Yes [provider]  lansoprazole (PREVACID) 30 MG capsule Take 60 mg by mouth 2 (two) times daily before a meal. DO NOT CHANGE PER MD   Yes [provider]  leflunomide  (ARAVA ) 10 MG tablet Take 10 mg by mouth daily. 08/01/20  Yes [provider]  LINEZOLID IV Inject 600 mg into the vein every 12 (twelve) hours. 7 day course starting on 03/25/2024   Yes  [provider]  Melatonin 10 MG TABS Take 10 mg by mouth at bedtime.   Yes [provider]  methenamine  (HIPREX ) 1 g tablet Take 1 tablet (1 g total) by mouth 2 (two) times daily. Prophylactic 01/22/24  Yes [name], [name], MD  methocarbamol  (ROBAXIN ) 500 MG tablet Take 1 tablet (500 mg total) by mouth every 8 (eight) hours as needed for muscle spasms. 03/15/24  Yes Amin, Ankit C, MD  metoprolol  tartrate (LOPRESSOR ) 25 MG tablet Take 0.5 tablets (12.5 mg total) by mouth 2 (two) times daily. 01/09/24  Yes [name], [name], MD  Multiple Vitamins-Minerals (PRESERVISION AREDS 2) CAPS Take 1 capsule by mouth in the morning and at bedtime.   Yes [provider]  Nutritional Supplement LIQD Take 120 mLs by mouth daily at 6 PM. House supplement on [facility]   Yes [provider]  ondansetron  (ZOFRAN ) 4 MG tablet Take 4 mg by mouth every 8 (eight) hours as needed for vomiting or nausea. 02/13/24  Yes [provider]  phenazopyridine  (PYRIDIUM ) 200 MG tablet Take 200 mg by mouth every 8 (eight) hours as needed (dysuria). 12/28/23  Yes [provider]  polyethylene glycol (MIRALAX  / GLYCOLAX ) 17 g packet Take 17 g by mouth daily as needed for mild constipation. 03/15/24  Yes Amin, Ankit C, MD  saccharomyces boulardii (FLORASTOR) 250 MG capsule Take 250 mg by mouth 3 (three) times daily.   Yes [provider]  senna (SENOKOT) 8.6 MG tablet Take 1 tablet by mouth 2 (two) times daily.   Yes [provider]  sodium chloride  0.9 % infusion Inject 10 mLs into the vein daily. Use 10 mL intravenously one time a day for flush upper right arm picc line 02/12/24  Yes [provider]  ticagrelor  (BRILINTA ) 60 MG TABS tablet Take 60 mg by mouth daily.   Yes [provider]  traMADol  (ULTRAM ) 50 MG tablet Take 1 tablet (50 mg total) by mouth every 8 (eight) hours as needed for severe pain (pain score 7-10). 03/15/24  Yes Amin, Ankit C, MD   traZODone  (DESYREL ) 100 MG tablet Take 100 mg by mouth at bedtime. 02/25/23  Yes [provider]  vitamin B-12 (CYANOCOBALAMIN ) 250 MCG tablet Take 250 mcg by mouth daily.   Yes [provider]  torsemide  (DEMADEX ) 20 MG tablet Take 20 mg by mouth daily. Patient not taking: Reported on 03/28/2024 08/31/23  [provider]    Physical Exam: BP 137/68   Pulse 83   Temp 98 F (36.7 C)   Resp 18   Ht 5' 6 (1.676 m)   Wt 72 kg   SpO2 97%   BMI 25.62 kg/m   General: 78 y.o. year-old female well developed well nourished in no acute distress.  Alert and oriented x3. HEENT: NCAT, EOMI Neck: Supple, trachea medial Cardiovascular: Regular rate and rhythm with no rubs or gallops.  No thyromegaly or JVD noted.  No lower extremity edema. 2/4 pulses in all 4 extremities. Respiratory: Rales in both lower lobes bilaterally (R > L ). No wheezes. Abdomen: Soft, nontender nondistended with normal bowel sounds x4 quadrants. Muskuloskeletal: No cyanosis, clubbing or edema noted bilaterally Neuro: CN II-XII intact, strength 5/5 x 4, sensation, reflexes intact Skin: No ulcerative lesions noted or rashes Psychiatry: Judgement and insight appear normal. Mood is appropriate for condition and setting          Labs on Admission:  Basic Metabolic Panel: Recent Labs  Lab 03/28/24 1617  NA 135  K 4.2  CL 108  CO2 15*  GLUCOSE 116*  BUN 35*  CREATININE 3.98*  CALCIUM  7.9*   Liver Function Tests: Recent Labs  Lab 03/28/24 1617  AST <10*  ALT <5  ALKPHOS 143*  BILITOT <0.2  PROT 6.0*  ALBUMIN 2.7*   Recent Labs  Lab 03/28/24 1617  LIPASE 19   No results for input(s): AMMONIA in the last 168 hours. CBC: Recent Labs  Lab 03/28/24 1617  WBC 9.2  NEUTROABS 6.9  HGB 8.9*  HCT 29.4*  MCV 100.7*  PLT 465*   Cardiac Enzymes: No results for input(s): CKTOTAL, CKMB, CKMBINDEX, TROPONINI in the last 168 hours.  BNP (last 3 results) No results for  input(s): BNP in the last 8760 hours.  ProBNP (last 3 results) No results for input(s): PROBNP in the last 8760 hours.  CBG: No results for input(s): GLUCAP in the last 168 hours.  Radiological Exams on Admission: CT ABDOMEN PELVIS WO CONTRAST Result Date: 03/28/2024 CLINICAL DATA:  Acute abdominal pain EXAM: CT ABDOMEN AND PELVIS WITHOUT CONTRAST TECHNIQUE: Multidetector CT imaging of the abdomen and pelvis was performed following the standard protocol without IV contrast. RADIATION DOSE REDUCTION: This exam was performed according to the departmental dose-optimization program which includes automated exposure control, adjustment of the mA and/or kV according to patient size and/or use of iterative reconstruction technique. COMPARISON:  03/10/2024 FINDINGS: Lower chest: There are bilateral pleural effusions, right greater than left, increased since prior study. Compressive atelectasis within the lower lobes. Hepatobiliary: Cholecystectomy. Unremarkable unenhanced appearance of the liver. Pancreas: Unremarkable unenhanced appearance. Spleen: Unremarkable unenhanced appearance. Adrenals/Urinary Tract: The bladder is decompressed with a Foley catheter. Persistent bladder wall thickening and perivesicular fat stranding. Stable bilateral hydroureteronephrosis. Stable mucosal thickening of the renal pelves and ureters concerning for urinary tract infection. No urinary tract calculi. The adrenals are unremarkable. Stomach/Bowel: No bowel obstruction or ileus. Distal colonic diverticulosis without diverticulitis. No bowel wall thickening or inflammatory change. Vascular/Lymphatic: Aortic atherosclerosis. No enlarged abdominal or pelvic lymph nodes. Reproductive: Status post hysterectomy. No adnexal masses. Other: No free fluid or free intraperitoneal gas. No abdominal wall hernia. Musculoskeletal: No acute or destructive bony abnormalities. Reconstructed images demonstrate no additional findings.  IMPRESSION: 1. Stable bilateral hydroureteronephrosis. 2. Stable mucosal thickening of the renal pelves and bilateral ureters, as well as diffuse bladder wall thickening and perivesicular fat stranding, concerning for cystitis and urinary  tract infection. 3. Bilateral pleural effusions, right greater than left, increased since prior exam. 4.  Aortic Atherosclerosis (ICD10-I70.0). Electronically Signed   By: Ozell Daring M.D.   On: 03/28/2024 18:12    EKG: I independently viewed the EKG done and my findings are as followed: EKG was not done in the ED  Assessment/Plan Present on Admission:  Acute kidney injury superimposed on stage 5 chronic kidney disease, not on chronic dialysis (HCC)  Metabolic acidosis  Urinary tract infection  Essential hypertension  Rheumatoid arthritis (HCC)  Principal Problem:   Acute kidney injury superimposed on stage 5 chronic kidney disease, not on chronic dialysis [facility]) Active Problems:   Urinary tract infection   Essential hypertension   Rheumatoid arthritis (HCC)   Metabolic acidosis   Bilateral pleural effusion   Obstructive uropathy   Macrocytic anemia   Hypoalbuminemia due to protein-calorie malnutrition   Acute kidney injury superimposed on stage V chronic kidney disease, not on chronic dialysis BUN/creatinine 5/3.98 (baseline creatinine at 1.3-1.8) IV hydration was provided in the ED Renally adjust medications, avoid nephrotoxic agents/dehydration/hypotension Nephrology will be consulted and patient also recommendations  Metabolic acidosis possibly secondary to above Serum bicarb of 15 in the setting of acute kidney injury  UTI POA Urinalysis was suspicious for UTI CT abdomen and pelvis without contrast was suggestive of UTI Urine culture done on 01/04/2024 was positive for Klebsiella pneumoniae (ESBL) which was only sensitive to imipenem and gentamicin. Patient was started on meropenem , we shall continue same at this time Urine culture  pending  Bilateral pleural effusion CT abdomen and pelvis without contrast showed  Bilateral pleural effusions, right greater than left, increased since prior exam. Right-sided thoracentesis will be done in the morning  Obstructive uropathy Continue Foley catheter Urologist (Dr. Sherrilee) was consulted and will follow-up with patient in the morning  Macrocytic anemia MCV 100.7, hemoglobin 8.9, hematocrit 29.4 Folate and vitamin B12 levels will be checked Continue vitamin B12 and folate   Hypoalbuminemia possibly secondary to moderate protein calorie malnutrition Albumin 2.7, protein supplement will be provided  Essential Hypertension Continue metoprolol  12.5 twice daily, Norvasc  2.5   Rheumatoid arthritis Stable, Leflunomide  and adalimumab will be held in the setting of acute infection  GERD Continue Protonix   DVT prophylaxis: SCDs  Code Status: Full code  Family Communication: Friend at bedside (all questions answered to satisfaction)  Consults: Nephrology, urology (Dr. Sherrilee) by [facility] EDP  Severity of Illness: The appropriate patient status for this patient is INPATIENT. Inpatient status is judged to be reasonable and necessary in order to provide the required intensity of service to ensure the patient's safety. The patient's presenting symptoms, physical exam findings, and initial radiographic and laboratory data in the context of their chronic comorbidities is felt to place them at high risk for further clinical deterioration. Furthermore, it is not anticipated that the patient will be medically stable for discharge from the hospital within 2 midnights of admission.   * I certify that at the point of admission it is my clinical judgment that the patient will require inpatient hospital care spanning beyond 2 midnights from the point of admission due to high intensity of service, high risk for further deterioration and high frequency of surveillance  required.*  Author: Elan Brainerd, DO 03/29/2024 1:57 AM  For on call review [email or web address].

## 2024-03-28 NOTE — ED Notes (Signed)
 Patient transported to CT

## 2024-03-28 NOTE — ED Triage Notes (Signed)
 Patient BIB [facility] from [facility] for complaint of Abnormal Labs. Has a Pic line left arm and a urinary cath, from facility.

## 2024-03-28 NOTE — ED Notes (Signed)
 Pt given ice chips

## 2024-03-29 DIAGNOSIS — N139 Obstructive and reflux uropathy, unspecified: Secondary | ICD-10-CM | POA: Insufficient documentation

## 2024-03-29 DIAGNOSIS — N179 Acute kidney failure, unspecified: Secondary | ICD-10-CM | POA: Diagnosis not present

## 2024-03-29 DIAGNOSIS — N185 Chronic kidney disease, stage 5: Secondary | ICD-10-CM | POA: Diagnosis not present

## 2024-03-29 DIAGNOSIS — J90 Pleural effusion, not elsewhere classified: Secondary | ICD-10-CM | POA: Insufficient documentation

## 2024-03-29 DIAGNOSIS — E46 Unspecified protein-calorie malnutrition: Secondary | ICD-10-CM | POA: Insufficient documentation

## 2024-03-29 DIAGNOSIS — D539 Nutritional anemia, unspecified: Secondary | ICD-10-CM | POA: Insufficient documentation

## 2024-03-29 LAB — COMPREHENSIVE METABOLIC PANEL WITH GFR
ALT: <5 U/L (ref 0–44)
AST: <10 U/L — ABNORMAL LOW (ref 15–41)
Albumin: 2.6 g/dL — ABNORMAL LOW (ref 3.5–5.0)
Alkaline Phosphatase: 137 U/L — ABNORMAL HIGH (ref 38–126)
Anion gap: 13 (ref 5–15)
BUN: 35 mg/dL — ABNORMAL HIGH (ref 8–23)
CO2: 15 mmol/L — ABNORMAL LOW (ref 22–32)
Calcium: 8 mg/dL — ABNORMAL LOW (ref 8.9–10.3)
Chloride: 110 mmol/L (ref 98–111)
Creatinine, Ser: 3.68 mg/dL — ABNORMAL HIGH (ref 0.44–1.00)
GFR, Estimated: 12 mL/min — ABNORMAL LOW (ref >60)
Glucose, Bld: 98 mg/dL (ref 70–99)
Potassium: 4.1 mmol/L (ref 3.5–5.1)
Sodium: 138 mmol/L (ref 135–145)
Total Bilirubin: <0.2 mg/dL (ref 0.0–1.2)
Total Protein: 5.9 g/dL — ABNORMAL LOW (ref 6.5–8.1)

## 2024-03-29 LAB — CBC
HCT: 29.6 % — ABNORMAL LOW (ref 36.0–46.0)
Hemoglobin: 9.1 g/dL — ABNORMAL LOW (ref 12.0–15.0)
MCH: 30.8 pg (ref 26.0–34.0)
MCHC: 30.7 g/dL (ref 30.0–36.0)
MCV: 100.3 fL — ABNORMAL HIGH (ref 80.0–100.0)
Platelets: 494 K/uL — ABNORMAL HIGH (ref 150–400)
RBC: 2.95 MIL/uL — ABNORMAL LOW (ref 3.87–5.11)
RDW: 15.9 % — ABNORMAL HIGH (ref 11.5–15.5)
WBC: 10 K/uL (ref 4.0–10.5)
nRBC: 0 % (ref 0.0–0.2)

## 2024-03-29 LAB — PHOSPHORUS: Phosphorus: 4.8 mg/dL — ABNORMAL HIGH (ref 2.5–4.6)

## 2024-03-29 LAB — MAGNESIUM: Magnesium: 2 mg/dL (ref 1.7–2.4)

## 2024-03-29 LAB — FOLATE: Folate: 15.2 ng/mL (ref >5.9)

## 2024-03-29 LAB — VITAMIN B12: Vitamin B-12: 1538 pg/mL — ABNORMAL HIGH (ref 180–914)

## 2024-03-29 MED ORDER — METOPROLOL TARTRATE 25 MG PO TABS
12.5000 mg | ORAL_TABLET | Freq: Two times a day (BID) | ORAL | Status: DC
  Administered 2024-03-29 – 2024-04-09 (×21): 12.5 mg via ORAL
  Filled 2024-03-29 (×23): qty 1

## 2024-03-29 MED ORDER — PANTOPRAZOLE SODIUM 40 MG PO TBEC
40.0000 mg | DELAYED_RELEASE_TABLET | Freq: Every day | ORAL | Status: DC
  Administered 2024-03-30 – 2024-04-09 (×11): 40 mg via ORAL
  Filled 2024-03-29 (×11): qty 1

## 2024-03-29 MED ORDER — CHLORHEXIDINE GLUCONATE CLOTH 2 % EX PADS
6.0000 | MEDICATED_PAD | Freq: Every day | CUTANEOUS | Status: DC
  Administered 2024-03-29 – 2024-04-09 (×12): 6 via TOPICAL

## 2024-03-29 MED ORDER — ENSURE PLUS HIGH PROTEIN PO LIQD
237.0000 mL | Freq: Two times a day (BID) | ORAL | Status: DC
  Administered 2024-04-02 – 2024-04-09 (×2): 237 mL via ORAL
  Filled 2024-03-29 (×4): qty 237

## 2024-03-29 MED ORDER — ALPRAZOLAM 0.5 MG PO TABS
0.5000 mg | ORAL_TABLET | Freq: Two times a day (BID) | ORAL | Status: DC | PRN
  Administered 2024-03-29 – 2024-04-08 (×17): 0.5 mg via ORAL
  Filled 2024-03-29 (×12): qty 1
  Filled 2024-03-29: qty 2
  Filled 2024-03-29 (×3): qty 1

## 2024-03-29 MED ORDER — AMLODIPINE BESYLATE 5 MG PO TABS
2.5000 mg | ORAL_TABLET | Freq: Every day | ORAL | Status: DC
  Administered 2024-03-30: 2.5 mg via ORAL
  Filled 2024-03-29: qty 1

## 2024-03-29 MED ORDER — TRAMADOL HCL 50 MG PO TABS
50.0000 mg | ORAL_TABLET | Freq: Two times a day (BID) | ORAL | Status: DC | PRN
  Administered 2024-03-29 – 2024-04-01 (×4): 50 mg via ORAL
  Filled 2024-03-29 (×4): qty 1

## 2024-03-29 MED ORDER — VITAMIN B-12 100 MCG PO TABS
250.0000 ug | ORAL_TABLET | Freq: Every day | ORAL | Status: DC
  Administered 2024-03-30 – 2024-04-09 (×11): 250 ug via ORAL
  Filled 2024-03-29 (×11): qty 3

## 2024-03-29 MED ORDER — ONDANSETRON HCL 4 MG/2ML IJ SOLN
4.0000 mg | Freq: Four times a day (QID) | INTRAMUSCULAR | Status: DC | PRN
  Administered 2024-03-29 – 2024-04-08 (×9): 4 mg via INTRAVENOUS
  Filled 2024-03-29 (×9): qty 2

## 2024-03-29 MED ORDER — ONDANSETRON HCL 4 MG PO TABS
4.0000 mg | ORAL_TABLET | Freq: Four times a day (QID) | ORAL | Status: DC | PRN
  Administered 2024-03-29 – 2024-04-07 (×11): 4 mg via ORAL
  Filled 2024-03-29 (×12): qty 1

## 2024-03-29 MED ORDER — FOLIC ACID 1 MG PO TABS
1.0000 mg | ORAL_TABLET | Freq: Every day | ORAL | Status: DC
  Administered 2024-03-30 – 2024-04-09 (×11): 1 mg via ORAL
  Filled 2024-03-29 (×11): qty 1

## 2024-03-29 NOTE — TOC Initial Note (Signed)
 Transition of Care [facility]) - Initial/Assessment Note    Patient Details  Name: Robin Blackburn MRN: [id number] Date of Birth: 1945/12/20  [facility] CM/SW Contact:    Noreen KATHEE Pinal, LCSWA Phone Number: 03/29/2024, 9:30 AM  Clinical Narrative:                   Patient is well known to ICM. Patient is at risk for readmission due to high admission score. Patient was admitted for Acute kidney injury superimposed on stage 5 chronic kidney disease, not on chronic dialysis. Patient is a long term resident at [facility] and plans on returning back once medically stable. Staff assist patient with all ADL's and she is WC. CSW spoke with POA Jafarrell and at this time , no chances. ICM will continue to follow.   Expected Discharge Plan: Skilled Nursing Facility Barriers to Discharge: Continued Medical Work up   Patient Goals and CMS Choice Patient states their goals for this hospitalization and ongoing recovery are:: return back to [facility] CMS Medicare.gov Compare Post Acute Care list provided to:: Patient Choice offered to / list presented to : Patient      Expected Discharge Plan and Services     Post Acute Care Choice: Skilled Nursing Facility Living arrangements for the past 2 months: Skilled Nursing Facility                                      Prior Living Arrangements/Services Living arrangements for the past 2 months: Skilled Nursing Facility Lives with:: Facility Resident Patient language and need for interpreter reviewed:: Yes Do you feel safe going back to the place where you live?: Yes      Need for Family Participation in Patient Care: Yes (Comment) Care giver support system in place?: Yes (comment) Current home services: DME Criminal Activity/Legal Involvement Pertinent to Current Situation/Hospitalization: No - Comment as needed  Activities of Daily Living   ADL Screening (condition at time of admission) Independently performs ADLs?: No  Permission  Sought/Granted      Share Information with NAME: Jafarrell     Permission granted to share info w Relationship: POA     Emotional Assessment Appearance:: Appears stated age Attitude/Demeanor/Rapport: Unable to Assess Affect (typically observed): Unable to Assess Orientation: : Oriented to Self Alcohol  / Substance Use: Not Applicable Psych Involvement: No (comment)  Admission diagnosis:  Bilateral hydronephrosis [N13.30] Acute kidney injury superimposed on CKD [N17.9, N18.9] UTI due to extended-spectrum beta lactamase (ESBL) producing Escherichia coli [N39.0, B96.29, Z16.12] Acute kidney injury superimposed on stage 5 chronic kidney disease, not on chronic dialysis (HCC) [N17.9, N18.5] Patient Active Problem List   Diagnosis Date Noted   Bilateral pleural effusion 03/29/2024   Obstructive uropathy 03/29/2024   Macrocytic anemia 03/29/2024   Hypoalbuminemia due to protein-calorie malnutrition 03/29/2024   Acute kidney injury superimposed on stage 5 chronic kidney disease, not on chronic dialysis (HCC) 03/28/2024   Dysarthria 03/14/2024   Acute-on-chronic kidney injury 03/10/2024   Hyperkalemia 03/10/2024   Bladder outlet obstruction 03/10/2024   Pneumonia 02/18/2024   Urinary tract infection 01/04/2024   Pulmonary nodules 01/27/2023   Bilateral hearing loss 07/01/2022   Chronic ear pain, bilateral 07/01/2022   Spinal stenosis of lumbar region 11/22/2021   OAB (overactive bladder) 05/21/2020   Osteoarthritis of right knee 06/01/2019   Weakness    Metabolic acidosis    Recurrent UTI 11/25/2016  Benzodiazepine withdrawal with perceptual disturbance (HCC) 04/02/2016   Osteoporosis 11/01/2015   Vitamin D  deficiency 10/11/2015   History of fracture of left ankle 09/28/2015   Chronic pain in left foot 09/28/2015   Fibromyalgia syndrome 09/28/2015   GERD (gastroesophageal reflux disease) 04/05/2015   Diarrhea 01/05/2015   Chronic pain syndrome 12/02/2014   Back pain,  lumbosacral 06/23/2014   Sinusitis, chronic 06/17/2014   H/O: CVA (cerebrovascular accident) 05/24/2014   HLD (hyperlipidemia) 05/24/2014   Physical deconditioning 05/24/2014   Right sided weakness 02/26/2014   Rheumatoid arthritis (HCC) 02/20/2014   Essential hypertension 02/19/2014   Neuropathy 02/19/2014   Anxiety 02/19/2014   PCP:  Bertell Satterfield, MD Pharmacy:   [facility] [place] - [place], [place] - [address] [place] [address] Phone: [phone] Fax: [phone]     Social Drivers of Health (SDOH) Social History: SDOH Screenings   Food Insecurity: No Food Insecurity (03/11/2024)  Housing: Low Risk  (03/11/2024)  Transportation Needs: No Transportation Needs (03/11/2024)  Utilities: Not At Risk (03/11/2024)  Alcohol  Screen: Low Risk  (01/06/2022)  Depression (PHQ2-9): Low Risk  (02/25/2024)  Financial Resource Strain: Low Risk  (01/06/2022)  Physical Activity: Inactive (11/07/2022)   Received from [facility]  Social Connections: Socially Isolated (03/11/2024)  Stress: No Stress Concern Present (01/06/2022)  Tobacco Use: Low Risk  (03/28/2024)   SDOH Interventions:     Readmission Risk Interventions    03/29/2024    9:23 AM 03/11/2024    8:52 AM 01/09/2024   11:45 AM  Readmission Risk Prevention Plan  Transportation Screening Complete Complete Complete  Home Care Screening   Complete  Medication Review (RN CM)   Complete  Medication Review Oceanographer) Complete Complete   HRI or Home Care Consult Complete Complete   SW Recovery Care/Counseling Consult Complete Complete   Palliative Care Screening Not Applicable Not Applicable   Skilled Nursing Facility Complete Complete

## 2024-03-29 NOTE — Progress Notes (Signed)
 PROGRESS NOTE    Robin Blackburn  FMW:[id number] DOB: 08/30/1945 DOA: 03/28/2024 PCP: Bertell Satterfield, MD   Brief Narrative:    Robin Blackburn is a 78 y.o. female with medical history significant of hypertension, rheumatoid arthritis, fibromyalgia, chronic pain, prior stroke, bladder outlet obstruction who presents to the emergency department via EMS due to abnormal lab.  Patient states that she was told that her kidney function was worsening, so she was sent to the ED for further evaluation and management.  She complained about nausea generalized weakness and abdominal pain but denies any discomfort with her urinary catheter.  Also denies fever, chills, cough, chest pain, shortness of breath, nausea, vomiting. Patient is wheelchair-bound at baseline.   ED Course:  In the emergency department, BP was 153/75, other vital signs are within normal range.  Workup in the ED shows WBC 9.2, hemoglobin 8.9, hematocrit 35.4, MCV 1.7, platelets 465.  BMP showed sodium 134, potassium 4.2, chloride 108, bicarb 15, blood glucose 116, BUN/creatinine 5/3.98 (baseline creatinine at 1.3-1.8), albumin 2.7, ALP 143.  Urinalysis was suggestive of UTI. CT abdomen and pelvis without contrast showed stable bilateral hydroureteronephrosis showed concern for cystitis and urinary tract infection.  Bilateral pleural effusions, right greater than left, increased since prior exam. Foley catheter was changed, patient was started on IV meropenem , Zofran  was given due to nausea and IV hydration was provided. Urologist (Dr. Sherrilee) was consulted  [facility] was asked to admit patient  Assessment & Plan:   Principal Problem:   Acute kidney injury superimposed on stage 5 chronic kidney disease, not on chronic dialysis [facility]) Active Problems:   Urinary tract infection   Essential hypertension   Rheumatoid arthritis (HCC)   GERD (gastroesophageal reflux disease)   Metabolic acidosis   Bilateral pleural effusion   Obstructive  uropathy   Macrocytic anemia   Hypoalbuminemia due to protein-calorie malnutrition   Acute kidney injury superimposed on stage V chronic kidney disease, not on chronic dialysis 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  Continue Foley catheter. Repeat labs in the a.m. Strict intake and output monitoring  Metabolic acidosis possibly secondary to above Serum bicarb of 15 in the setting of acute kidney injury   UTI POA Urinalysis was suspicious for UTI CT abdomen and pelvis without contrast was suggestive of UTI Urine culture done on 01/04/2024 was positive for Klebsiella pneumoniae (ESBL) which was only sensitive to imipenem and gentamicin. Patient was started on meropenem , we shall continue same at this time Urine culture pending   Bilateral pleural effusion CT abdomen and pelvis without contrast showed  Bilateral pleural effusions, right greater than left, increased since prior exam. Right-sided thoracentesis ordered per admitting provider.   Macrocytic anemia MCV 100.7, hemoglobin 8.9, hematocrit 29.4 Folate and vitamin B12 levels will be checked Continue vitamin B12 and folate    Hypoalbuminemia possibly secondary to moderate protein calorie malnutrition Albumin 2.7, protein supplement will be provided   Essential Hypertension Continue metoprolol  12.5 twice daily, Norvasc  2.5   Rheumatoid arthritis Stable, Leflunomide  and adalimumab will be held in the setting of acute infection   GERD Continue Protonix    DVT prophylaxis: SCDs   Code Status: Full code   Family Communication: Friend at bedside (all questions answered to satisfaction)   Consults: Nephrology, urology (Dr. Sherrilee) by [facility] EDP   Severity of Illness: The appropriate patient status  for this patient is  INPATIENT. Inpatient status is judged to be reasonable and necessary in order to provide the required intensity of service to ensure the patient's safety. The patient's presenting symptoms, physical exam findings, and initial radiographic and laboratory data in the context of their chronic comorbidities is felt to place them at high risk for further clinical deterioration. Furthermore, it is not anticipated that the patient will be medically stable for discharge from the hospital within 2 midnights of admission.    * I certify that at the point of admission it is my clinical judgment that the patient will require inpatient hospital care spanning beyond 2 midnights from the point of admission due to high intensity of service, high risk for further deterioration and high frequency of surveillance required.*   Subjective:  Patient seen and examined at the bedside.  She was not in any acute distress.  She reported some abdominal discomfort and some flank pain especially on the right.  No nausea or vomiting.  Vital signs are stable.  She is afebrile. Objective: Vitals:   03/29/24 0400 03/29/24 0700 03/29/24 0833 03/29/24 0915  BP: 132/74 (!) 129/58  (!) 156/50  Pulse: 85 81  97  Resp: 18 18    Temp:   98.3 F (36.8 C) 98.3 F (36.8 C)  TempSrc:   Oral Oral  SpO2: 92% 94%  99%  Weight:      Height:       No intake or output data in the 24 hours ending 03/29/24 1407 Filed Weights   03/28/24 1440  Weight: 72 kg    Examination:  General exam: Appears calm and comfortable  Respiratory system: Bilateral decreased breath sounds at bases Cardiovascular system: S1 & S2 heard, Rate controlled Gastrointestinal system: Mild tenderness in the right flank area. Extremities: No cyanosis, clubbing, edema  Central nervous system: Alert and oriented. No focal neurological deficits. Moving extremities Skin: No rashes, lesions or ulcers Psychiatry: Judgement and insight appear normal.  Mood & affect appropriate.     Data Reviewed: I have personally reviewed following labs and imaging studies  CBC: Recent Labs  Lab 03/28/24 1617 03/29/24 0529  WBC 9.2 10.0  NEUTROABS 6.9  --   HGB 8.9* 9.1*  HCT 29.4* 29.6*  MCV 100.7* 100.3*  PLT 465* 494*   Basic Metabolic Panel: Recent Labs  Lab 03/28/24 1617 03/29/24 0529  NA 135 138  K 4.2 4.1  CL 108 110  CO2 15* 15*  GLUCOSE 116* 98  BUN 35* 35*  CREATININE 3.98* 3.68*  CALCIUM  7.9* 8.0*  MG  --  2.0  PHOS  --  4.8*   GFR: Estimated Creatinine Clearance: 12.8 mL/min (A) (by C-G formula based on SCr of 3.68 mg/dL (H)). Liver Function Tests: Recent Labs  Lab 03/28/24 1617 03/29/24 0529  AST <10* <10*  ALT <5 <5  ALKPHOS 143* 137*  BILITOT <0.2 <0.2  PROT 6.0* 5.9*  ALBUMIN 2.7* 2.6*   Recent Labs  Lab 03/28/24 1617  LIPASE 19   No results for input(s): AMMONIA in the last 168 hours. Coagulation Profile: No results for input(s): INR, PROTIME in the last 168 hours. Cardiac Enzymes: No results for input(s): CKTOTAL, CKMB, CKMBINDEX, TROPONINI in the last 168 hours. BNP (last 3 results) No results for input(s): PROBNP in the last 8760 hours. HbA1C: No results for input(s): HGBA1C in the last 72 hours. CBG: No results for input(s): GLUCAP in the last 168 hours. Lipid Profile: No results for input(s): CHOL, HDL, LDLCALC, TRIG, CHOLHDL,  LDLDIRECT in the last 72 hours. Thyroid  Function Tests: No results for input(s): TSH, T4TOTAL, FREET4, T3FREE, THYROIDAB in the last 72 hours. Anemia Panel: Recent Labs    03/28/24 1617  VITAMINB12 1,538*  FOLATE 15.2   Sepsis Labs: No results for input(s): PROCALCITON, LATICACIDVEN in the last 168 hours.  No results found for this or any previous visit (from the past 240 hours).       Radiology Studies: CT ABDOMEN PELVIS WO CONTRAST Result Date: 03/28/2024 CLINICAL DATA:  Acute abdominal pain EXAM: CT  ABDOMEN AND PELVIS WITHOUT CONTRAST TECHNIQUE: Multidetector CT imaging of the abdomen and pelvis was performed following the standard protocol without IV contrast. RADIATION DOSE REDUCTION: This exam was performed according to the departmental dose-optimization program which includes automated exposure control, adjustment of the mA and/or kV according to patient size and/or use of iterative reconstruction technique. COMPARISON:  03/10/2024 FINDINGS: Lower chest: There are bilateral pleural effusions, right greater than left, increased since prior study. Compressive atelectasis within the lower lobes. Hepatobiliary: Cholecystectomy. Unremarkable unenhanced appearance of the liver. Pancreas: Unremarkable unenhanced appearance. Spleen: Unremarkable unenhanced appearance. Adrenals/Urinary Tract: The bladder is decompressed with a Foley catheter. Persistent bladder wall thickening and perivesicular fat stranding. Stable bilateral hydroureteronephrosis. Stable mucosal thickening of the renal pelves and ureters concerning for urinary tract infection. No urinary tract calculi. The adrenals are unremarkable. Stomach/Bowel: No bowel obstruction or ileus. Distal colonic diverticulosis without diverticulitis. No bowel wall thickening or inflammatory change. Vascular/Lymphatic: Aortic atherosclerosis. No enlarged abdominal or pelvic lymph nodes. Reproductive: Status post hysterectomy. No adnexal masses. Other: No free fluid or free intraperitoneal gas. No abdominal wall hernia. Musculoskeletal: No acute or destructive bony abnormalities. Reconstructed images demonstrate no additional findings. IMPRESSION: 1. Stable bilateral hydroureteronephrosis. 2. Stable mucosal thickening of the renal pelves and bilateral ureters, as well as diffuse bladder wall thickening and perivesicular fat stranding, concerning for cystitis and urinary tract infection. 3. Bilateral pleural effusions, right greater than left, increased since prior exam.  4.  Aortic Atherosclerosis (ICD10-I70.0). Electronically Signed   By: Ozell Daring M.D.   On: 03/28/2024 18:12        Scheduled Meds:  amLODipine   2.5 mg Oral Daily   Chlorhexidine  Gluconate Cloth  6 each Topical Q0600   feeding supplement  237 mL Oral BID BM   folic acid   1 mg Oral Daily   metoprolol  tartrate  12.5 mg Oral BID   pantoprazole   40 mg Oral Daily   vitamin B-12  250 mcg Oral Daily   Continuous Infusions:  meropenem  (MERREM ) IV 1 g (03/29/24 1138)          Derryl Duval, MD [facility] 03/29/2024, 2:07 PM

## 2024-03-29 NOTE — Plan of Care (Signed)

## 2024-03-30 ENCOUNTER — Inpatient Hospital Stay (HOSPITAL_COMMUNITY)

## 2024-03-30 ENCOUNTER — Encounter (HOSPITAL_COMMUNITY): Payer: Self-pay | Admitting: Internal Medicine

## 2024-03-30 DIAGNOSIS — E872 Acidosis, unspecified: Secondary | ICD-10-CM | POA: Diagnosis not present

## 2024-03-30 DIAGNOSIS — M069 Rheumatoid arthritis, unspecified: Secondary | ICD-10-CM

## 2024-03-30 DIAGNOSIS — N3000 Acute cystitis without hematuria: Secondary | ICD-10-CM

## 2024-03-30 DIAGNOSIS — N179 Acute kidney failure, unspecified: Secondary | ICD-10-CM | POA: Diagnosis not present

## 2024-03-30 LAB — CBC WITH DIFFERENTIAL/PLATELET
Abs Immature Granulocytes: 0.14 K/uL — ABNORMAL HIGH (ref 0.00–0.07)
Basophils Absolute: 0.1 K/uL (ref 0.0–0.1)
Basophils Relative: 2 %
Eosinophils Absolute: 0.1 K/uL (ref 0.0–0.5)
Eosinophils Relative: 2 %
HCT: 26.6 % — ABNORMAL LOW (ref 36.0–46.0)
Hemoglobin: 8.3 g/dL — ABNORMAL LOW (ref 12.0–15.0)
Immature Granulocytes: 2 %
Lymphocytes Relative: 14 %
Lymphs Abs: 1.2 K/uL (ref 0.7–4.0)
MCH: 31 pg (ref 26.0–34.0)
MCHC: 31.2 g/dL (ref 30.0–36.0)
MCV: 99.3 fL (ref 80.0–100.0)
Monocytes Absolute: 0.8 K/uL (ref 0.1–1.0)
Monocytes Relative: 9 %
Neutro Abs: 6.2 K/uL (ref 1.7–7.7)
Neutrophils Relative %: 71 %
Platelets: 439 K/uL — ABNORMAL HIGH (ref 150–400)
RBC: 2.68 MIL/uL — ABNORMAL LOW (ref 3.87–5.11)
RDW: 16 % — ABNORMAL HIGH (ref 11.5–15.5)
WBC: 8.7 K/uL (ref 4.0–10.5)
nRBC: 0 % (ref 0.0–0.2)

## 2024-03-30 LAB — GRAM STAIN

## 2024-03-30 LAB — BASIC METABOLIC PANEL WITH GFR
Anion gap: 12 (ref 5–15)
BUN: 34 mg/dL — ABNORMAL HIGH (ref 8–23)
CO2: 16 mmol/L — ABNORMAL LOW (ref 22–32)
Calcium: 7.9 mg/dL — ABNORMAL LOW (ref 8.9–10.3)
Chloride: 111 mmol/L (ref 98–111)
Creatinine, Ser: 3.8 mg/dL — ABNORMAL HIGH (ref 0.44–1.00)
GFR, Estimated: 12 mL/min — ABNORMAL LOW (ref >60)
Glucose, Bld: 101 mg/dL — ABNORMAL HIGH (ref 70–99)
Potassium: 3.8 mmol/L (ref 3.5–5.1)
Sodium: 139 mmol/L (ref 135–145)

## 2024-03-30 LAB — LACTATE DEHYDROGENASE, PLEURAL OR PERITONEAL FLUID: LD, Fluid: 155 U/L — ABNORMAL HIGH (ref 3–23)

## 2024-03-30 LAB — BODY FLUID CELL COUNT WITH DIFFERENTIAL
Lymphs, Fluid: 4 %
Monocyte-Macrophage-Serous Fluid: 70 % (ref 50–90)
Neutrophil Count, Fluid: 26 % — ABNORMAL HIGH (ref 0–25)
Total Nucleated Cell Count, Fluid: 4295 uL — ABNORMAL HIGH (ref 0–1000)

## 2024-03-30 LAB — URINE CULTURE: Culture: NO GROWTH

## 2024-03-30 LAB — GLUCOSE, PLEURAL OR PERITONEAL FLUID: Glucose, Fluid: 95 mg/dL

## 2024-03-30 LAB — PROTEIN, PLEURAL OR PERITONEAL FLUID: Total protein, fluid: <3 g/dL

## 2024-03-30 MED ORDER — LIDOCAINE HCL (PF) 2 % IJ SOLN
10.0000 mL | Freq: Once | INTRAMUSCULAR | Status: AC
  Administered 2024-03-30: 10 mL

## 2024-03-30 MED ORDER — AMLODIPINE BESYLATE 5 MG PO TABS
5.0000 mg | ORAL_TABLET | Freq: Every day | ORAL | Status: DC
  Administered 2024-03-31 – 2024-04-09 (×10): 5 mg via ORAL
  Filled 2024-03-30 (×10): qty 1

## 2024-03-30 MED ORDER — LIDOCAINE HCL (PF) 2 % IJ SOLN
INTRAMUSCULAR | Status: AC
  Filled 2024-03-30: qty 10

## 2024-03-30 NOTE — Progress Notes (Addendum)
 Pt has complaint of enlarged knot under her left jaw. Firm but mobile, very tender to touch. States has had some pain in throat today. No fever, no diff swallowing, no other enlarged or palpable nodules in neck. MD Sreeram notified of same.  Pt and caregiver updated per MD Darci that nephrologist will see pt tomorrow for decision regarding dialysis cath insertion. Both updated on order for NPO after MN for potential procedure tomorrow. Both state understanding.

## 2024-03-30 NOTE — Progress Notes (Signed)
 Progress Note   Patient: Robin Blackburn FMW:[id number] DOB: June 02, 1946 DOA: 03/28/2024     2 DOS: the patient was seen and examined on 03/30/2024   Brief hospital course: SERENITI WAN is a 78 y.o. female with medical history significant of hypertension, rheumatoid arthritis, fibromyalgia, chronic pain, prior stroke, bladder outlet obstruction who presents to the emergency department via EMS due to abnormal lab.  Patient states that she was told that her kidney function was worsening, so she was sent to the ED for further evaluation and management.  She complained about nausea generalized weakness and abdominal pain but denies any discomfort with her urinary catheter.  Also denies fever, chills, cough, chest pain, shortness of breath, nausea, vomiting.  CT abdomen and pelvis without contrast showed stable bilateral hydroureteronephrosis showed concern for cystitis and urinary tract infection.  Bilateral pleural effusions, right greater than left, increased since prior exam. Foley catheter was changed, patient was started on IV meropenem , Zofran  was given due to nausea and IV hydration was provided.  Patient is admitted to the hospitalist service for further management evaluation of AKI, metabolic acidosis, UTI, pleural effusions.  Assessment and Plan: Acute kidney injury superimposed on stage III chronic kidney disease Obstructive uropathy BUN/creatinine 5/3.98 (baseline creatinine at 1.3-1.8) IV hydration was provided in the ED. Will stop fluids, she eating fair. Renally adjust medications, avoid nephrotoxic agents/dehydration/hypotension She refused HD on prior admission. I did discuss with patient and POA at bedside regarding her condition and she agreed to HD. Nephrology consulted for morning. Continue Foley catheter care, she is making urine. Repeat labs in the a.m. Strict intake and output monitoring   Metabolic acidosis possibly secondary to above Low bicarb in the setting of kidney  dysfunction. Low calcium , high phos noted. Will follow nephrology eval.   Urinalysis suspicious for UTI CT abdomen and pelvis without contrast was suggestive of UTI Urine culture done on 01/04/2024 was positive for Klebsiella pneumoniae (ESBL) which was only sensitive to imipenem and gentamicin. Continue meropenem , follow urine cultures.   Bilateral pleural effusion CT abdomen and pelvis without contrast showed bilateral pleural effusions, right greater than left, increased since prior exam. Right-sided thoracentesis done with removed. Follow fluid analysis.   Macrocytic anemia MCV 100.7, hemoglobin 8.9, hematocrit 29.4 Folate and vitamin B12 levels normal.   Hypoalbuminemia possibly secondary to moderate protein calorie malnutrition Albumin 2.7, protein supplement will be provided   Essential Hypertension Continue metoprolol  12.5 twice daily, increased Norvasc  to 5mg .   Rheumatoid arthritis Stable, Leflunomide  and adalimumab will be held in the setting of acute infection   GERD Continue Protonix      Out of bed to chair. Incentive spirometry. Nursing supportive care. Fall, aspiration precautions. Diet:  Diet Orders (From admission, onward)     Start     Ordered   03/30/24 1103  Diet renal with fluid restriction Fluid restriction: 1200 mL Fluid; Room service appropriate? Yes; Fluid consistency: Thin  Diet effective now       Question Answer Comment  Fluid restriction: 1200 mL Fluid   Room service appropriate? Yes   Fluid consistency: Thin      03/30/24 1102           DVT prophylaxis: SCDs Start: 03/29/24 0148  Level of care: Med-Surg   Code Status: Full Code  Subjective: Patient is seen and examined today morning. She is lying in bed. She wishes to eat. When I discussed about HD she initially refused but did wished for full code. Later  she agreed with HD. POA at bedside.  Physical Exam: Vitals:   03/30/24 0905 03/30/24 0913 03/30/24 0936 03/30/24 1223   BP: (!) 163/79 (!) 120/98 (!) 156/63 (!) 159/80  Pulse: (!) 111 (!) 115 (!) 102 95  Resp: 20 20 18 20   Temp:   98.9 F (37.2 C) 98.1 F (36.7 C)  TempSrc:   Oral Oral  SpO2: 97% 97% 94% 94%  Weight:      Height:        General - Elderly Caucasian ill female, no apparent distress HEENT - PERRLA, EOMI, atraumatic head, non tender sinuses. Lung - Clear, basal rales, rhonchi, no wheezes. Heart - S1, S2 heard, no murmurs, rubs, trace pedal edema. Abdomen - Soft, non tender, bowel sounds good Neuro - Alert, awake and oriented, non focal exam. Skin - Warm and dry.  Data Reviewed:      Latest Ref Rng & Units 03/30/2024    4:38 AM 03/29/2024    5:29 AM 03/28/2024    4:17 PM  CBC  WBC 4.0 - 10.5 K/uL 8.7  10.0  9.2   Hemoglobin 12.0 - 15.0 g/dL 8.3  9.1  8.9   Hematocrit 36.0 - 46.0 % 26.6  29.6  29.4   Platelets 150 - 400 K/uL 439  494  465       Latest Ref Rng & Units 03/30/2024    4:38 AM 03/29/2024    5:29 AM 03/28/2024    4:17 PM  BMP  Glucose 70 - 99 mg/dL 898  98  883   BUN 8 - 23 mg/dL 34  35  35   Creatinine 0.44 - 1.00 mg/dL 6.19  6.31  6.01   Sodium 135 - 145 mmol/L 139  138  135   Potassium 3.5 - 5.1 mmol/L 3.8  4.1  4.2   Chloride 98 - 111 mmol/L 111  110  108   CO2 22 - 32 mmol/L 16  15  15    Calcium  8.9 - 10.3 mg/dL 7.9  8.0  7.9    US  THORACENTESIS ASP PLEURAL SPACE W/IMG GUIDE Result Date: 03/30/2024 INDICATION: 78 year old female. Admitted for abnormal lab value. Found to have a right-sided pleural effusion. Request is for therapeutic and diagnostic right sided thoracentesis EXAM: ULTRASOUND GUIDED THERAPEUTIC AND DIAGNOSTIC RIGHT-SIDED THORACENTESIS MEDICATIONS: Lidocaine  1% 10 mL COMPLICATIONS: None immediate. PROCEDURE: An ultrasound guided thoracentesis was thoroughly discussed with the patient and questions answered. The benefits, risks, alternatives and complications were also discussed. The patient understands and wishes to proceed with the  procedure. Written consent was obtained. Ultrasound was performed to localize and mark an adequate pocket of fluid in the right chest. The area was then prepped and draped in the normal sterile fashion. 1% Lidocaine  was used for local anesthesia. Under ultrasound guidance a 6 Fr Safe-T-Centesis catheter was introduced. Thoracentesis was performed. The catheter was removed and a dressing applied. FINDINGS: A total of approximately 600 mL of pale yellow fluid was removed. Samples were sent to the laboratory as requested by the clinical team. IMPRESSION: Successful ultrasound guided therapeutic and diagnostic right-sided thoracentesis yielding 600 mL of pale yellow pleural fluid. Performed by Delon Beagle NP Electronically Signed   By: Ester Sides M.D.   On: 03/30/2024 11:14   DG Chest 1 View Result Date: 03/30/2024 CLINICAL DATA:  Post thoracentesis today. EXAM: CHEST  1 VIEW COMPARISON:  Chest x-ray 02/18/2024, CT 03/28/2024 FINDINGS: Left-sided PICC line is present with tip over the axillary vein. Patient is  rotated to the left. Lungs are hypoinflated. No significant right-sided effusion post thoracentesis. No pneumothorax. Minimal patchy density right base likely atelectasis. Minimal stable tenting of the left hemidiaphragm. Left lung otherwise clear. Cardiomediastinal silhouette and remainder of the exam is unchanged. IMPRESSION: Hypoinflation with minimal patchy density right base likely atelectasis. No significant right-sided effusion post thoracentesis. No pneumothorax. Electronically Signed   By: Toribio Agreste M.D.   On: 03/30/2024 09:34   CT ABDOMEN PELVIS WO CONTRAST Result Date: 03/28/2024 CLINICAL DATA:  Acute abdominal pain EXAM: CT ABDOMEN AND PELVIS WITHOUT CONTRAST TECHNIQUE: Multidetector CT imaging of the abdomen and pelvis was performed following the standard protocol without IV contrast. RADIATION DOSE REDUCTION: This exam was performed according to the departmental  dose-optimization program which includes automated exposure control, adjustment of the mA and/or kV according to patient size and/or use of iterative reconstruction technique. COMPARISON:  03/10/2024 FINDINGS: Lower chest: There are bilateral pleural effusions, right greater than left, increased since prior study. Compressive atelectasis within the lower lobes. Hepatobiliary: Cholecystectomy. Unremarkable unenhanced appearance of the liver. Pancreas: Unremarkable unenhanced appearance. Spleen: Unremarkable unenhanced appearance. Adrenals/Urinary Tract: The bladder is decompressed with a Foley catheter. Persistent bladder wall thickening and perivesicular fat stranding. Stable bilateral hydroureteronephrosis. Stable mucosal thickening of the renal pelves and ureters concerning for urinary tract infection. No urinary tract calculi. The adrenals are unremarkable. Stomach/Bowel: No bowel obstruction or ileus. Distal colonic diverticulosis without diverticulitis. No bowel wall thickening or inflammatory change. Vascular/Lymphatic: Aortic atherosclerosis. No enlarged abdominal or pelvic lymph nodes. Reproductive: Status post hysterectomy. No adnexal masses. Other: No free fluid or free intraperitoneal gas. No abdominal wall hernia. Musculoskeletal: No acute or destructive bony abnormalities. Reconstructed images demonstrate no additional findings. IMPRESSION: 1. Stable bilateral hydroureteronephrosis. 2. Stable mucosal thickening of the renal pelves and bilateral ureters, as well as diffuse bladder wall thickening and perivesicular fat stranding, concerning for cystitis and urinary tract infection. 3. Bilateral pleural effusions, right greater than left, increased since prior exam. 4.  Aortic Atherosclerosis (ICD10-I70.0). Electronically Signed   By: Ozell Daring M.D.   On: 03/28/2024 18:12    Family Communication: Discussed with patient , her POA at bedside. They understand and agree. All questions  answered.  Disposition: Status is: Inpatient Remains inpatient appropriate because: IV antibiotics, need for HD  Planned Discharge Destination: Home with Home Health     Time spent: 47 minutes  Author: Concepcion Riser, MD 03/30/2024 3:56 PM Secure chat 7am to 7pm For on call review [email or web address].

## 2024-03-30 NOTE — Progress Notes (Signed)
 Patient tolerated Right Thoracentesis procedure well today and 600 mL of pleural fluid removed and sent to lab for processing. Patient verbalized understanding of post procedure instructions and transported via stretcher to xray at this time for post chest xray with no acute distress noted.

## 2024-03-30 NOTE — Procedures (Signed)
 Ultrasound-guided diagnostic and therapeutic right sided thoracentesis performed yielding 600 milliliters of pale yellow colored fluid. No immediate complications.   Diagnostic fluid was sent to the lab for further analysis. Follow-up chest x-ray pending. EBL is < 2 ml.

## 2024-03-30 NOTE — Progress Notes (Signed)
 Pt returned from thoracentesis procedure. A&O, denies any c/o SOB or pain. DDI to right upper/lateral back. VSS. Breath sounds noted bilaterally, less diminished on right lower lung field, rhonchi noted. Pt encouraged to use IS, good effort and use.  Pt and caregiver updated on diet order and order for fluid restrictions ( ). Both state understanding. Call bell within reach, bed alarm on for safety. Advised to call for needs.

## 2024-03-31 ENCOUNTER — Encounter: Payer: Self-pay | Admitting: Neurology

## 2024-03-31 ENCOUNTER — Ambulatory Visit: Admitting: Neurology

## 2024-03-31 DIAGNOSIS — R4589 Other symptoms and signs involving emotional state: Secondary | ICD-10-CM

## 2024-03-31 DIAGNOSIS — N179 Acute kidney failure, unspecified: Secondary | ICD-10-CM | POA: Diagnosis not present

## 2024-03-31 DIAGNOSIS — Z7189 Other specified counseling: Secondary | ICD-10-CM

## 2024-03-31 DIAGNOSIS — N1330 Unspecified hydronephrosis: Secondary | ICD-10-CM

## 2024-03-31 DIAGNOSIS — Z515 Encounter for palliative care: Secondary | ICD-10-CM | POA: Diagnosis not present

## 2024-03-31 DIAGNOSIS — Z1612 Extended spectrum beta lactamase (ESBL) resistance: Secondary | ICD-10-CM

## 2024-03-31 DIAGNOSIS — B9629 Other Escherichia coli [E. coli] as the cause of diseases classified elsewhere: Secondary | ICD-10-CM

## 2024-03-31 DIAGNOSIS — N189 Chronic kidney disease, unspecified: Secondary | ICD-10-CM

## 2024-03-31 DIAGNOSIS — N185 Chronic kidney disease, stage 5: Secondary | ICD-10-CM | POA: Diagnosis not present

## 2024-03-31 LAB — BASIC METABOLIC PANEL WITH GFR
Anion gap: 11 (ref 5–15)
BUN: 33 mg/dL — ABNORMAL HIGH (ref 8–23)
CO2: 16 mmol/L — ABNORMAL LOW (ref 22–32)
Calcium: 7.7 mg/dL — ABNORMAL LOW (ref 8.9–10.3)
Chloride: 109 mmol/L (ref 98–111)
Creatinine, Ser: 3.39 mg/dL — ABNORMAL HIGH (ref 0.44–1.00)
GFR, Estimated: 13 mL/min — ABNORMAL LOW (ref >60)
Glucose, Bld: 112 mg/dL — ABNORMAL HIGH (ref 70–99)
Potassium: 3.7 mmol/L (ref 3.5–5.1)
Sodium: 136 mmol/L (ref 135–145)

## 2024-03-31 LAB — CBC
HCT: 24.7 % — ABNORMAL LOW (ref 36.0–46.0)
Hemoglobin: 7.6 g/dL — ABNORMAL LOW (ref 12.0–15.0)
MCH: 30.3 pg (ref 26.0–34.0)
MCHC: 30.8 g/dL (ref 30.0–36.0)
MCV: 98.4 fL (ref 80.0–100.0)
Platelets: 352 K/uL (ref 150–400)
RBC: 2.51 MIL/uL — ABNORMAL LOW (ref 3.87–5.11)
RDW: 16.1 % — ABNORMAL HIGH (ref 11.5–15.5)
WBC: 8 K/uL (ref 4.0–10.5)
nRBC: 0 % (ref 0.0–0.2)

## 2024-03-31 LAB — CYTOLOGY - NON PAP

## 2024-03-31 MED ORDER — STERILE WATER FOR INJECTION IV SOLN
INTRAVENOUS | Status: DC
  Filled 2024-03-31 (×3): qty 1000

## 2024-03-31 MED ORDER — LACTATED RINGERS IV SOLN: INTRAVENOUS | Status: AC

## 2024-03-31 MED ORDER — SODIUM CHLORIDE 0.9 % IV SOLN
2.0000 g | INTRAVENOUS | Status: AC
  Administered 2024-04-01: 2 g via INTRAVENOUS

## 2024-03-31 NOTE — Progress Notes (Addendum)
 Patient ID: Robin Blackburn, female   DOB: 1946-06-09, 78 y.o.   MRN: [id number] Reason for Consult: AKI/CKD stage IIIb Referring Physician: Maree, DO  JAZLIN TAPSCOTT is an 79 y.o. female with a history of a prior CVA, GERD, fibromyalgia, hypertension, neuropathy, depression, rheumatoid arthritis, CKD stage IIIb, obstructive uropathy and a recurrent urinary tract infections presenting from [facility] facility for abnormal labs. She had a similar presentation on 03/10/24.  Please see initial consultation dated 03/11/24 from Dr. Melia.  She was discharged on 03/15/24 with Scr of 3.25 down from peak of 6.28.  Her AKI at that time was felt to be combination of obstructive uropathy and UTI.  She improved with IVF's but when seen by her PCP for outpatient follow up, her Scr was noted to be elevated again at 3.95 and Co2 of 16.  When she was seen earlier this month, she reported that she would not want any form of dialysis (short term or long term), however she reports that she has changed her mind and would take dialysis if needed (I want to live).  CT scan of abdomen at time of admission showed stable bilateral hydroureteronephrosis and mucosal thickening of the renal pelves and ureters with diffuse bladder wall thickening.  We have been asked to see the patient again for AKI/CKD stage IIIb.  The trend in Scr is seen below.   She was also found to have worsening bilateral pleural effusions, R>L and underwent US  guided thoracentesis yesterday.   She denied any SOB, cough, fever, orthopnea or PND.  She did have some N/V with poor po intake prior to admission.  Trend in Creatinine: Creatinine, Ser  Date/Time Value Ref Range Status  03/31/2024 04:04 AM 3.39 (H) 0.44 - 1.00 mg/dL Final  89/77/7974 95:61 AM 3.80 (H) 0.44 - 1.00 mg/dL Final  89/78/7974 94:70 AM 3.68 (H) 0.44 - 1.00 mg/dL Final  89/79/7974 95:82 PM 3.98 (H) 0.44 - 1.00 mg/dL Final  89/92/7974 94:76 AM 3.25 (H) 0.44 - 1.00 mg/dL Final  89/92/7974 94:76 AM  3.23 (H) 0.44 - 1.00 mg/dL Final  89/93/7974 95:67 AM 3.87 (H) 0.44 - 1.00 mg/dL Final  89/94/7974 95:59 AM 4.81 (H) 0.44 - 1.00 mg/dL Final  89/95/7974 94:47 PM 5.60 (H) 0.44 - 1.00 mg/dL Final  89/96/7974 94:92 AM 6.14 (H) 0.44 - 1.00 mg/dL Final  89/97/7974 97:70 PM 6.28 (H) 0.44 - 1.00 mg/dL Final  91/74/7974 97:44 PM 1.84 (H) 0.44 - 1.00 mg/dL Final  91/93/7974 94:71 PM 1.36 (H) 0.44 - 1.00 mg/dL Final  91/97/7974 95:81 AM 1.18 (H) 0.44 - 1.00 mg/dL Final  91/98/7974 95:67 AM 1.29 (H) 0.44 - 1.00 mg/dL Final  92/68/7974 95:72 AM 1.31 (H) 0.44 - 1.00 mg/dL Final  92/69/7974 91:89 AM 1.44 (H) 0.44 - 1.00 mg/dL Final  92/70/7974 95:65 AM 1.73 (H) 0.44 - 1.00 mg/dL Final  92/71/7974 92:64 PM 1.96 (H) 0.44 - 1.00 mg/dL Final  92/83/7974 87:78 PM 1.46 (H) 0.44 - 1.00 mg/dL Final  94/70/7974 96:47 PM 1.35 (H) 0.44 - 1.00 mg/dL Final  96/83/7979 94:80 AM 0.78 0.44 - 1.00 mg/dL Final  96/84/7979 93:72 AM 0.70 0.44 - 1.00 mg/dL Final  96/85/7979 93:78 AM 0.73 0.44 - 1.00 mg/dL Final  96/86/7979 97:99 PM 0.82 0.44 - 1.00 mg/dL Final  97/78/7979 87:99 PM 0.89 0.44 - 1.00 mg/dL Final  93/71/7981 98:48 AM 0.61 0.44 - 1.00 mg/dL Final  93/77/7981 94:75 AM 0.65 0.44 - 1.00 mg/dL Final  93/78/7981 94:88 AM 0.57 0.44 -  1.00 mg/dL Final  93/79/7981 95:49 AM 0.73 0.44 - 1.00 mg/dL Final  93/80/7981 97:94 PM 0.82 0.44 - 1.00 mg/dL Final  89/72/7982 89:49 AM 0.59 0.44 - 1.00 mg/dL Final  89/73/7982 94:41 AM 0.62 0.44 - 1.00 mg/dL Final  89/74/7982 93:61 AM 0.64 0.44 - 1.00 mg/dL Final  89/74/7982 87:85 AM 0.62 0.44 - 1.00 mg/dL Final  92/79/7982 92:79 AM 0.70 0.44 - 1.00 mg/dL Final  92/80/7982 92:79 AM 0.67 0.44 - 1.00 mg/dL Final  92/89/7982 87:69 PM 0.72 0.44 - 1.00 mg/dL Final  93/82/7982 95:58 AM 0.77 0.44 - 1.00 mg/dL Final  93/84/7982 91:94 AM 0.79 0.44 - 1.00 mg/dL Final  93/92/7982 93:69 AM 0.83 0.44 - 1.00 mg/dL Final  94/68/7982 93:69 AM 0.65 0.44 - 1.00 mg/dL Final  94/94/7982 91:84  AM 0.81 0.44 - 1.00 mg/dL Final  96/86/7982 93:75 AM 0.70 0.44 - 1.00 mg/dL Final  96/91/7982 90:99 AM 0.69 0.44 - 1.00 mg/dL Final  97/91/7982 92:99 AM 0.75 0.44 - 1.00 mg/dL Final  87/73/7983 93:99 AM 0.73 0.44 - 1.00 mg/dL Final  87/84/7983 91:99 AM 0.74 0.44 - 1.00 mg/dL Final  88/98/7983 90:96 PM 0.85 0.44 - 1.00 mg/dL Final  89/68/7983 93:69 AM 0.72 0.44 - 1.00 mg/dL Final  89/71/7983 91:99 AM 0.72 0.44 - 1.00 mg/dL Final  89/85/7983 91:84 AM 0.82 0.44 - 1.00 mg/dL Final    PMH:   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   Depression    Fibromyalgia    Hypertension    Neuropathy    Rheumatoid arthritis (HCC)    Stroke (HCC) 02/17/14    PSH:   Past Surgical History:  Procedure Laterality Date   ABDOMINAL HYSTERECTOMY     ANKLE RECONSTRUCTION     APPENDECTOMY     BACK SURGERY     CHOLECYSTECTOMY     KNEE SURGERY      Allergies:  Allergies  Allergen Reactions   Cortisone Anaphylaxis    Cardiovascular Arrest   Fentanyl  Anaphylaxis, Hives and Other (See Comments)    felt like I had demons in my head   Hydromorphone  Anaphylaxis and Nausea And Vomiting    GI Intolerance   Iodinated Contrast Media Anaphylaxis, Hives, Rash and Dermatitis    Respiratory Distress  Iodinated contrast media (substance)   Keflex  [Cephalexin ] Nausea And Vomiting   Meperidine Anaphylaxis and Shortness Of Breath    Respiratory Distress   Morphine Anaphylaxis, Nausea And Vomiting, Swelling and Rash    GI Intolerance   Oxycodone -Acetaminophen  Anaphylaxis, Nausea And Vomiting, Swelling, Dermatitis and Rash    GI Intolerance, Mouth swelling.  acetaminophen  / oxycodone    Penicillins Anaphylaxis, Nausea And Vomiting and Other (See Comments)    Immediate rash, facial/tongue/throat swelling, SOB or lightheadedness with hypotension  Product containing penicillin (product)   Afluria Preservative Free  [Influenza Virus Vacc Split Pf] Other (See Comments)    Unknown   Aspirin  Other (See Comments)    GI Intolerance   Codeine Other (See Comments)    Unknown    Influenza Virus Vaccine Other (See Comments)    Unknown    Meperidine Hcl Other (See Comments)    Unknown   Oxycodone  Hcl Other (See Comments)    Unknown   Shellfish Allergy Other (See Comments)    Glucosamine not an option Unknown    Sulfa  Antibiotics Nausea And Vomiting    Tolerates Bactrim  though  Troleandomycin Nausea And Vomiting and Swelling    GI Intolerance   Potassium Chloride  Other (See Comments)    No reaction listed on MAR   Bisphosphonates Hives and Other (See Comments)    GI intolerance   Ciprofloxacin  Nausea Only and Rash   Levaquin [Levofloxacin In D5w] Nausea And Vomiting and Rash    Mental Status Changes, Confusion   Oxytetracycline Nausea And Vomiting    Medications:   Prior to Admission medications   Medication Sig Start Date End Date Taking? Authorizing Provider  Adalimumab (HUMIRA, 2 PEN,) 40 MG/0.8ML PNKT Inject 40 mg into the skin every 14 (fourteen) days.   Yes [provider]  ALPRAZolam  (XANAX ) 0.5 MG tablet Take 1 tablet (0.5 mg total) by mouth 2 (two) times daily as needed for anxiety. 03/15/24  Yes Amin, Ankit C, MD  amLODipine  (NORVASC ) 2.5 MG tablet Take 2.5 mg by mouth daily. Hold for SBP<110 12/10/23  Yes [provider]  benzonatate (TESSALON) 200 MG capsule Take 200 mg by mouth every 8 (eight) hours as needed for cough.   Yes [provider]  chlorhexidine  (PERIDEX ) 0.12 % solution Use as directed 15 mLs in the mouth or throat 2 (two) times daily.   Yes [provider]  Cholecalciferol  5000 units TABS Take 1 tablet by mouth daily.    Yes [provider]  Cranberry 450 MG TABS Take 1 tablet by mouth in the morning and at bedtime. For UTI   Yes [provider]  cycloSPORINE  (RESTASIS ) 0.05 % ophthalmic emulsion Place 1 drop into both eyes  2 (two) times daily. Dry eyes   Yes [provider]  dextromethorphan-guaiFENesin (MUCINEX DM) 30-600 MG 12hr tablet Take 1 tablet by mouth every 12 (twelve) hours as needed for cough.   Yes [provider]  dicyclomine  (BENTYL ) 20 MG tablet Take 20 mg by mouth every 6 (six) hours. Abdominal cramps 01/31/14  Yes [provider]  estradiol  (ESTRACE ) 0.1 MG/GM vaginal cream Place 1 Applicatorful vaginally every other day. Discard plastic applicator. Insert a blueberry size amount (approximately 1 gram) of cream on fingertip inside vagina at bedtime every other night for long term use. Patient taking differently: Place 1 Applicatorful vaginally every other day. 01/09/24  Yes [name], [name], MD  ferrous sulfate  325 (65 FE) MG EC tablet Take 1 tablet (325 mg total) by mouth daily with breakfast. 02/04/24  Yes [name], PA-C  fluconazole (DIFLUCAN) IVPB Inject 200 mg into the vein daily. 14 Day course starting on 03/25/2024   Yes [provider]  fluticasone (FLONASE) 50 MCG/ACT nasal spray Place 1 spray into both nostrils in the morning and at bedtime.   Yes [provider]  folic acid  (FOLVITE ) 1 MG tablet Take 1 mg by mouth daily.   Yes [provider]  gabapentin  (NEURONTIN ) 300 MG capsule Take 1 capsule (300 mg total) by mouth 3 (three) times daily as needed (neuropathy pain symptoms). Patient taking differently: Take 300 mg by mouth every 8 (eight) hours as needed (for pain). 01/09/24  Yes [name], [name], MD  guaiFENesin-dextromethorphan (ROBITUSSIN DM) 100-10 MG/5ML syrup Take 10 mLs by mouth at bedtime.   Yes [provider]  lansoprazole (PREVACID) 30 MG capsule Take 60 mg by mouth 2 (two) times daily before a meal. DO NOT CHANGE PER MD   Yes [provider]  leflunomide  (ARAVA ) 10 MG tablet Take 10 mg by mouth daily. 08/01/20  Yes [provider]  LINEZOLID  IV Inject 600 mg into the vein every 12 (twelve)  hours. 7 day course starting on 03/25/2024   Yes [provider]  Melatonin 10 MG TABS Take 10 mg by mouth at bedtime.   Yes [provider]  methenamine  (HIPREX ) 1 g tablet Take 1 tablet (1 g total) by mouth 2 (two) times daily. Prophylactic 01/22/24  Yes [name], [name], MD  methocarbamol  (ROBAXIN ) 500 MG tablet Take 1 tablet (500 mg total) by mouth every 8 (eight) hours as needed for muscle spasms. 03/15/24  Yes Amin, Ankit C, MD  metoprolol  tartrate (LOPRESSOR ) 25 MG tablet Take 0.5 tablets (12.5 mg total) by mouth 2 (two) times daily. 01/09/24  Yes [name], [name], MD  Multiple Vitamins-Minerals (PRESERVISION AREDS 2) CAPS Take 1 capsule by mouth in the morning and at bedtime.   Yes [provider]  Nutritional Supplement LIQD Take 120 mLs by mouth daily at 6 PM. House supplement on [facility]   Yes [provider]  ondansetron  (ZOFRAN ) 4 MG tablet Take 4 mg by mouth every 8 (eight) hours as needed for vomiting or nausea. 02/13/24  Yes [provider]  phenazopyridine  (PYRIDIUM ) 200 MG tablet Take 200 mg by mouth every 8 (eight) hours as needed (dysuria). 12/28/23  Yes [provider]  polyethylene glycol (MIRALAX  / GLYCOLAX ) 17 g packet Take 17 g by mouth daily as needed for mild constipation. 03/15/24  Yes Amin, Ankit C, MD  saccharomyces boulardii (FLORASTOR) 250 MG capsule Take 250 mg by mouth 3 (three) times daily.   Yes [provider]  senna (SENOKOT) 8.6 MG tablet Take 1 tablet by mouth 2 (two) times daily.   Yes [provider]  sodium chloride  0.9 % infusion Inject 10 mLs into the vein daily. Use 10 mL intravenously one time a day for flush upper right arm picc line 02/12/24  Yes [provider]  ticagrelor  (BRILINTA ) 60 MG TABS tablet Take 60 mg by mouth daily.   Yes [provider]  traMADol  (ULTRAM ) 50 MG tablet Take 1 tablet (50 mg total) by mouth every 8 (eight) hours as needed for severe pain (pain  score 7-10). 03/15/24  Yes Amin, Ankit C, MD  traZODone  (DESYREL ) 100 MG tablet Take 100 mg by mouth at bedtime. 02/25/23  Yes [provider]  vitamin B-12 (CYANOCOBALAMIN ) 250 MCG tablet Take 250 mcg by mouth daily.   Yes [provider]  torsemide  (DEMADEX ) 20 MG tablet Take 20 mg by mouth daily. Patient not taking: Reported on 03/28/2024 08/31/23   [provider]    Inpatient medications:  amLODipine   5 mg Oral Daily   Chlorhexidine  Gluconate Cloth  6 each Topical Q0600   feeding supplement  237 mL Oral BID BM   folic acid   1 mg Oral Daily   metoprolol  tartrate  12.5 mg Oral BID   pantoprazole   40 mg Oral Daily   vitamin B-12  250 mcg Oral Daily    Discontinued Meds:   Medications Discontinued During This Encounter  Medication Reason   meropenem  (MERREM ) 1 g in sodium chloride  0.9 % 100 mL IVPB    Probiotic, Lactobacillus, CAPS Change in therapy   sodium zirconium cyclosilicate  (LOKELMA ) 10 g PACK packet Completed Course   Dextromethorphan-guaiFENesin (MUCINEX DM MAXIMUM STRENGTH) 60-1200 MG TB12 Change in therapy   amLODipine  (NORVASC ) tablet 2.5 mg     Social History:  reports that she has never smoked. She has never been exposed to tobacco smoke. She  has never used smokeless tobacco. She reports that she does not drink alcohol  and does not use drugs.  Family History:   Family History  Problem Relation Age of Onset   Breast cancer Mother    Hypertension Father    Transient ischemic attack Father    Lung cancer Father    Hypertension Brother    Leukemia Brother    Leukemia Brother    Leukemia Brother    CVA Maternal Grandfather     Pertinent items are noted in HPI. Weight change:   Intake/Output Summary (Last 24 hours) at 03/31/2024 0748 Last data filed at 03/31/2024 0602 Gross per 24 hour  Intake 1220 ml  Output 1700 ml  Net -480 ml   BP (!) 150/81 (BP Location: Right Arm)   Pulse 93   Temp 98.7 F (37.1 C) (Oral)   Resp 18   Ht  5' 6 (1.676 m)   Wt 72 kg   SpO2 93%   BMI 25.62 kg/m  Vitals:   03/30/24 0936 03/30/24 1223 03/30/24 1949 03/31/24 0342  BP: (!) 156/63 (!) 159/80 (!) 157/74 (!) 150/81  Pulse: (!) 102 95 (!) 103 93  Resp: 18 20 18 18   Temp: 98.9 F (37.2 C) 98.1 F (36.7 C) 98.8 F (37.1 C) 98.7 F (37.1 C)  TempSrc: Oral Oral Oral Oral  SpO2: 94% 94% 95% 93%  Weight:      Height:         General appearance: fatigued, no distress, and pale Head: Normocephalic, without obvious abnormality, atraumatic Resp: diminished breath sounds bibasilar Cardio: regular rate and rhythm, S1, S2 normal, no murmur, click, rub or gallop GI: soft, non-tender; bowel sounds normal; no masses,  no organomegaly Extremities: extremities normal, atraumatic, no cyanosis or edema  Labs: Basic Metabolic Panel: Recent Labs  Lab 03/28/24 1617 03/29/24 0529 03/30/24 0438 03/31/24 0404  NA 135 138 139 136  K 4.2 4.1 3.8 3.7  CL 108 110 111 109  CO2 15* 15* 16* 16*  GLUCOSE 116* 98 101* 112*  BUN 35* 35* 34* 33*  CREATININE 3.98* 3.68* 3.80* 3.39*  ALBUMIN 2.7* 2.6*  --   --   CALCIUM  7.9* 8.0* 7.9* 7.7*  PHOS  --  4.8*  --   --    Liver Function Tests: Recent Labs  Lab 03/28/24 1617 03/29/24 0529  AST <10* <10*  ALT <5 <5  ALKPHOS 143* 137*  BILITOT <0.2 <0.2  PROT 6.0* 5.9*  ALBUMIN 2.7* 2.6*   Recent Labs  Lab 03/28/24 1617  LIPASE 19   No results for input(s): AMMONIA in the last 168 hours. CBC: Recent Labs  Lab 03/28/24 1617 03/29/24 0529 03/30/24 0438 03/31/24 0404  WBC 9.2 10.0 8.7 8.0  NEUTROABS 6.9  --  6.2  --   HGB 8.9* 9.1* 8.3* 7.6*  HCT 29.4* 29.6* 26.6* 24.7*  MCV 100.7* 100.3* 99.3 98.4  PLT 465* 494* 439* 352   PT/INR: @LABRCNTIP (inr:5) Cardiac Enzymes: )No results for input(s): CKTOTAL, CKMB, CKMBINDEX, TROPONINI in the last 168 hours. CBG: No results for input(s): GLUCAP in the last 168 hours.  Iron Studies: No results for input(s): IRON, TIBC,  TRANSFERRIN, FERRITIN in the last 168 hours.  Xrays/Other Studies: US  THORACENTESIS ASP PLEURAL SPACE W/IMG GUIDE Result Date: 03/30/2024 INDICATION: 78 year old female. Admitted for abnormal lab value. Found to have a right-sided pleural effusion. Request is for therapeutic and diagnostic right sided thoracentesis EXAM: ULTRASOUND GUIDED THERAPEUTIC AND DIAGNOSTIC RIGHT-SIDED THORACENTESIS MEDICATIONS: Lidocaine  1% 10  mL COMPLICATIONS: None immediate. PROCEDURE: An ultrasound guided thoracentesis was thoroughly discussed with the patient and questions answered. The benefits, risks, alternatives and complications were also discussed. The patient understands and wishes to proceed with the procedure. Written consent was obtained. Ultrasound was performed to localize and mark an adequate pocket of fluid in the right chest. The area was then prepped and draped in the normal sterile fashion. 1% Lidocaine  was used for local anesthesia. Under ultrasound guidance a 6 Fr Safe-T-Centesis catheter was introduced. Thoracentesis was performed. The catheter was removed and a dressing applied. FINDINGS: A total of approximately 600 mL of pale yellow fluid was removed. Samples were sent to the laboratory as requested by the clinical team. IMPRESSION: Successful ultrasound guided therapeutic and diagnostic right-sided thoracentesis yielding 600 mL of pale yellow pleural fluid. Performed by Delon Beagle NP Electronically Signed   By: Ester Sides M.D.   On: 03/30/2024 11:14   DG Chest 1 View Result Date: 03/30/2024 CLINICAL DATA:  Post thoracentesis today. EXAM: CHEST  1 VIEW COMPARISON:  Chest x-ray 02/18/2024, CT 03/28/2024 FINDINGS: Left-sided PICC line is present with tip over the axillary vein. Patient is rotated to the left. Lungs are hypoinflated. No significant right-sided effusion post thoracentesis. No pneumothorax. Minimal patchy density right base likely atelectasis. Minimal stable tenting of the left  hemidiaphragm. Left lung otherwise clear. Cardiomediastinal silhouette and remainder of the exam is unchanged. IMPRESSION: Hypoinflation with minimal patchy density right base likely atelectasis. No significant right-sided effusion post thoracentesis. No pneumothorax. Electronically Signed   By: Toribio Agreste M.D.   On: 03/30/2024 09:34     Assessment/Plan:  AKI/CKD stage IIIb - again sounds like pre-renal insults with poor po intake, N/V prior to admission as well as ongoing obstructive uropathy with persistent bilateral hydroureteronephrosis.  UOP improved as well as BUN/Cr with IVF's.  She still appears volume depleted and will restart IVF's and follow.  Stop fluid restriction and resume diet, no indication for dialysis catheter placement.  She is a poor dialysis candidate given her advanced age, multiple co-morbidities, and poor functional and nutritional status.  Would recommend to continue with conservative measures and recommend palliative care consult to help set goals/limits of care.  Thankfully no urgent indication for dialysis at this time and will continue to follow.   Avoid nephrotoxic medications including NSAIDs and iodinated intravenous contrast exposure unless the latter is absolutely indicated.   Preferred narcotic agents for pain control are hydromorphone , fentanyl , and methadone. Morphine should not be used.  Avoid Baclofen and avoid oral sodium phosphate and magnesium citrate based laxatives / bowel preps.  Continue strict Input and Output monitoring. Will monitor the patient closely with you and intervene or adjust therapy as indicated by changes in clinical status/labs   Pleural effusions - R>L.  S/p thoracentesis of 600 mL of pale yellow fluid.  Cell count 4,295, LDH 155, protein <3, glucose 95, gram stain without organisms seen.  Cultures pending.  Plan per primary svc.  Cytology pending. Obstructive uropathy - has persistent bilateral hydroureteronephrosis and thickened bladder.   Would consider bilateral percutaneous nephrostomy tubes since foley catheter has not relieved obstruction.  Recommend Urology evaluation and discussion with IR since she is a poor HD candidate.  Possible recurrent UTI - per primary svc Moderate protein and caloric protein malnutrition - will need protein supplementation. AGMA - due to #1.  Will start lactated ringers , however KCl comes up as an unknown allergy but it sounds like she couldn't swallow the pills  at her SNF.  She may eventually require oral bicarb therapy Acute on chronic anemia - hgb has dropped to 7.6.  transfuse as needed Rheumatoid arthritis - on leflunomide  and adalimumab. GERD - on protonix .   Fairy DELENA Sellar 03/31/2024, 7:48 AM

## 2024-03-31 NOTE — Plan of Care (Signed)
  Problem: Education: Goal: Knowledge of General Education information will improve Description: Including pain rating scale, medication(s)/side effects and non-pharmacologic comfort measures Outcome: Progressing   Problem: Health Behavior/Discharge Planning: Goal: Ability to manage health-related needs will improve Outcome: Progressing   Problem: Clinical Measurements: Goal: Respiratory complications will improve Outcome: Progressing Goal: Cardiovascular complication will be avoided Outcome: Progressing   Problem: Nutrition: Goal: Adequate nutrition will be maintained Outcome: Progressing   Problem: Elimination: Goal: Will not experience complications related to bowel motility Outcome: Progressing Goal: Will not experience complications related to urinary retention Outcome: Progressing

## 2024-03-31 NOTE — Consult Note (Signed)
 Palliative Care Consult Note                                  Date: 03/31/2024   Patient Name: Robin Blackburn  DOB: 1946/01/14  MRN: [id number]  Age / Sex: 78 y.o., female  PCP: Bertell Satterfield, MD Referring Physician: Maree Adron BIRCH, DO  Reason for Consultation: Establishing goals of care  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   Depression    Fibromyalgia    Hypertension    Neuropathy    Rheumatoid arthritis (HCC)    Stroke (HCC) 02/17/14    Subjective:   This NP Camellia Kays reviewed medical records, received report from team, assessed the patient and then meet at the patient's bedside to discuss diagnosis, prognosis, GOC, EOL wishes disposition and options.  Before meeting with the patient/family, I spent time reviewing the chart notes including ED nursing notes, ED provider notes, admission H&P, TOC notes from a couple days ago, nursing note from yesterday, radiology procedural note from yesterday, internal medicine note from yesterday, nursing note from today, nephrology note from today.  After seeing the patient I also reviewed hospitalist note from today and social worker note from today. I also reviewed vital signs, nursing flowsheets, medication administrations record, labs, and imaging. Labs reviewed include CBC which shows normal white blood cell count at 8.0 in the setting of chronic indwelling Foley and possible UTI.  BMP shows normal potassium at 3.7, mild improvement in creatinine from 3.98 on admission to 3.39 today in the setting of AKI on CKD.  I met with the patient at bedside.  Her friend/HCPOA Louanne Herring was also present.  She provided a copy of HCPOA documentation which was scanned into epic in the ACP tab/Vynca.   We meet to discuss diagnosis prognosis, GOC, EOL wishes, disposition and options. Concept of Palliative Care was  introduced as specialized medical care for people and their families living with serious illness.  If focuses on providing relief from the symptoms and stress of a serious illness.  The goal is to improve quality of life for both the patient and the family. Values and goals of care important to patient and family were attempted to be elicited.  Created space and opportunity for patient  and family to explore thoughts and feelings regarding current medical situation   Natural trajectory and current clinical status were discussed. Questions and concerns addressed. Patient  encouraged to call with questions or concerns.    Patient/Family Understanding of Illness: The patient states that she is here because of a problem with her bladder and kidneys.  They have mentioned dialysis which she has while full-blown.  They told her this morning that they do not think she needs dialysis.  We spent a good amount of time talking about her chronic health history as well as acute presentation.  She seems to have a very good understanding of her current health issues.  Life Review: The patient currently lives at [facility] skilled nursing facility.  Her only family remaining is an aunt who is in poor health and some nieces and nephews, although she is not in communication with them.  Her chosen family is her HCPOA and friend Jafarrell as well as friends at her skilled nursing facility/LTC.  When she first met her friend,  Ms. Dozier was taking care of her mother and visiting her at the same LTC as the patient.  They became good friends.  The patient has also been in the hall ambassador at the skilled nursing facility helping to welcome new residence.  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  Making other people happy is what brings her happiness.  She also enjoys playing bingo and doing the bead auction at the facility.  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  Patient Values: Currently her decision seems to be informed by her desire to continue living.  She also values family and friends.  Baseline Status: She lives at [facility].  Prior to her fall in August she was wheelchair-bound but able to transfer herself independently into the wheelchair and get around with the wheelchair.  Today's Discussion: In addition to discussions described above we had extensive discussion of various topics.  We spent time talking about her acute health illness, in and out of the hospital with AKI.  We talked about the etiologies behind this including obstructive uropathy as well as poor oral intake and dehydration.  We talked about different things that are being thrown around as far as options.  We spent time talk about hemodialysis.  We had a good explanation of what is entailed with hemodialysis, the pros and the cons, some of the suffering that may result from that as well as inconvenience and time.  At this point she is open to hemodialysis if needed.  However she hopes that it will not be needed.  We talked about CODE STATUS.  At this point she wants to remain a full code.  We also talked about possibility of feeding tubes in the future.  At this point she states that she would be open to a feeding tube if needed.  Finally we talked about the possibility of bilateral nephrostomy tubes to help with the obstructive uropathy.  We had a detailed explanation of what they are, how they work, including using images to explain where they are.  At this point the patient is not fully committed, but is open to further discussion with urology and IR.  I discussed that palliative medicine is not here over the weekend.  However, I would be back on Monday and we check on her if she is still admitted to the hospital. I provided emotional and general support through therapeutic listening, empathy, sharing of stories, therapeutic touch, and other techniques. I  answered all questions and addressed all concerns to the best of my ability.  Goals: Full code, full scope of care.  Open to hemodialysis.  Considering bilateral nephrostomy tubes pending further discussion with specialist.  Review of Systems  Constitutional:        Denies pain in general  Respiratory:  Negative for shortness of breath.   Cardiovascular:  Negative for chest pain.  Gastrointestinal:  Negative for abdominal pain, nausea and vomiting.    Objective:   Primary Diagnoses: Present on Admission:  Acute kidney injury superimposed on stage 5 chronic kidney disease, not on chronic dialysis (HCC)  Metabolic acidosis  Urinary tract infection  Essential hypertension  Rheumatoid arthritis (HCC)  GERD (gastroesophageal reflux disease)   Vital Signs:  BP (!) 144/67 (BP Location: Right Arm)   Pulse 100   Temp 99.1 F (37.3 C) (Oral)   Resp 20   Ht 5' 6 (1.676 m)   Wt 72 kg  SpO2 95%   BMI 25.62 kg/m   Physical Exam Vitals and nursing note reviewed.  Constitutional:      General: She is not in acute distress.    Appearance: She is ill-appearing.  HENT:     Head: Normocephalic and atraumatic.  Cardiovascular:     Rate and Rhythm: Normal rate.  Pulmonary:     Effort: Pulmonary effort is normal. No respiratory distress.  Abdominal:     General: Abdomen is flat.     Palpations: Abdomen is soft.  Skin:    General: Skin is warm and dry.  Neurological:     General: No focal deficit present.     Mental Status: She is alert and oriented to person, place, and time.  Psychiatric:        Mood and Affect: Mood normal.        Behavior: Behavior normal.     Palliative Assessment/Data: 50%   Advanced Care Planning:   Existing Vynca/ACP Documentation: MOST form signed 03/25/2021 Advance directive signed 07/26/2023  Primary Decision Maker: PATIENT  Pertinent diagnosis: AKI, CKD, bladder outlet obstruction, possible UTI  The patient and/or family consented to a  voluntary Advance Care Planning Conversation in person. Individuals present for the conversation: The patient; Louanne Herring, friend/HCPOA; Camellia Kays, NP  Summary of the conversation: We discussed her chronic health history, acute presentation, healthcare issues.  We discussed CODE STATUS, hemodialysis, feeding tubes, nephrostomy tubes.  We discussed options moving forward from full and aggressive care through comfort focused approach and multiple points in between including topics listed above.  Outcome of the conversations and/or documents completed: Full code, full scope of care.  Open to hemodialysis if needed, open to feeding tubes if needed.  Agreeable to discuss nephrostomy tubes as a possibility, would like further information from specialists including urology and IR.  I spent 50 minutes providing separately identifiable ACP services with the patient and/or surrogate decision maker in a voluntary, in-person conversation discussing the patient's wishes and goals as detailed in the above note.  Assessment & Plan:   HPI/Patient Profile: 78 y.o. female  with past medical history of hypertension, rheumatoid arthritis, fibromyalgia, chronic pain, prior stroke, bladder outlet obstruction who presents to the emergency department via EMS due to abnormal lab.  She was admitted on 03/28/2024 with AKI on CKD 3, struct of uropathy, metabolic acidosis, urinalysis suspicious for UTI, bilateral pleural effusions, microcytic anemia, hypoalbuminemia, and others.   Palliative medicine was consulted for GOC conversations.  SUMMARY OF RECOMMENDATIONS   Full code Full scope of care Open to hemodialysis if offered In the future would be open to feeding tube Open to discussions about nephrostomy tubes with urology and/or IR Palliative medicine will follow-up when next on service 04/04/2024 if still admitted  Symptom Management:  Per primary team Palliative medicine is available to assist as  needed  Code Status: Full Code  Prognosis:  Unable to determine  Discharge Planning:  To Be Determined   Discussed with: Patient, friend/HCPOA, medical team, nursing team    Thank you for allowing us  to participate in the care of CYSTAL SHANNAHAN PMT will continue to support holistically.  Billing based on MDM: Moderate  Detailed review of medical records (labs, imaging, vital signs), medically appropriate exam, discussed with treatment team, counseling and education to patient, family, & staff, documenting clinical information, medication management, coordination of care  Signed by: Camellia Kays, NP Palliative Medicine Team  Team Phone # [phone] (Nights/Weekends)  03/31/2024, 9:48 AM

## 2024-03-31 NOTE — Plan of Care (Signed)
  Problem: Education: Goal: Knowledge of General Education information will improve Description: Including pain rating scale, medication(s)/side effects and non-pharmacologic comfort measures Outcome: Progressing   Problem: Health Behavior/Discharge Planning: Goal: Ability to manage health-related needs will improve Outcome: Progressing   Problem: Clinical Measurements: Goal: Ability to maintain clinical measurements within normal limits will improve Outcome: Progressing Goal: Will remain free from infection Outcome: Progressing Goal: Diagnostic test results will improve Outcome: Progressing   Problem: Nutrition: Goal: Adequate nutrition will be maintained Outcome: Progressing   Problem: Coping: Goal: Level of anxiety will decrease Outcome: Progressing   Problem: Elimination: Goal: Will not experience complications related to bowel motility Outcome: Progressing   Problem: Pain Managment: Goal: General experience of comfort will improve and/or be controlled Outcome: Progressing   Problem: Safety: Goal: Ability to remain free from injury will improve Outcome: Progressing   Problem: Skin Integrity: Goal: Risk for impaired skin integrity will decrease Outcome: Progressing

## 2024-03-31 NOTE — NC FL2 (Signed)
 [place]  MEDICAID FL2 LEVEL OF CARE FORM     IDENTIFICATION  Patient Name: Robin Blackburn Birthdate: 05-25-1946 Sex: female Admission Date (Current Location): 03/28/2024  [place] and [place] Number:  [place] [id number] Facility and Address:  [facility],  [address] [place] [address]      Provider Number: [id number]  Attending Physician Name and Address:  Maree Adron BIRCH, DO  Relative Name and Phone Number:  Dozier Jenny Pricilla)  [phone]    Current Level of Care: Hospital Recommended Level of Care: Nursing Facility Prior Approval Number:    Date Approved/Denied:   PASRR Number: [id number]  Discharge Plan: SNF    Current Diagnoses: Patient Active Problem List   Diagnosis Date Noted   Bilateral pleural effusion 03/29/2024   Obstructive uropathy 03/29/2024   Macrocytic anemia 03/29/2024   Hypoalbuminemia due to protein-calorie malnutrition 03/29/2024   Acute kidney injury superimposed on stage 5 chronic kidney disease, not on chronic dialysis (HCC) 03/28/2024   Dysarthria 03/14/2024   Acute-on-chronic kidney injury 03/10/2024   Hyperkalemia 03/10/2024   Bladder outlet obstruction 03/10/2024   Pneumonia 02/18/2024   Urinary tract infection 01/04/2024   Pulmonary nodules 01/27/2023   Bilateral hearing loss 07/01/2022   Chronic ear pain, bilateral 07/01/2022   Spinal stenosis of lumbar region 11/22/2021   OAB (overactive bladder) 05/21/2020   Osteoarthritis of right knee 06/01/2019   Weakness    Metabolic acidosis    Recurrent UTI 11/25/2016   Benzodiazepine withdrawal with perceptual disturbance (HCC) 04/02/2016   Osteoporosis 11/01/2015   Vitamin D  deficiency 10/11/2015   History of fracture of left ankle 09/28/2015   Chronic pain in left foot 09/28/2015   Fibromyalgia syndrome 09/28/2015   GERD (gastroesophageal reflux disease) 04/05/2015   Diarrhea 01/05/2015   Chronic pain syndrome 12/02/2014   Back pain, lumbosacral  06/23/2014   Sinusitis, chronic 06/17/2014   H/O: CVA (cerebrovascular accident) 05/24/2014   HLD (hyperlipidemia) 05/24/2014   Physical deconditioning 05/24/2014   Right sided weakness 02/26/2014   Rheumatoid arthritis (HCC) 02/20/2014   Essential hypertension 02/19/2014   Neuropathy 02/19/2014   Anxiety 02/19/2014    Orientation RESPIRATION BLADDER Height & Weight     Self, Time, Situation, Place  Normal Continent, External catheter (Foley) Weight: 158 lb 11.7 oz (72 kg) Height:  5' 6 (167.6 cm)  BEHAVIORAL SYMPTOMS/MOOD NEUROLOGICAL BOWEL NUTRITION STATUS      Incontinent Diet (See DC summary)  AMBULATORY STATUS COMMUNICATION OF NEEDS Skin   Extensive Assist Verbally Normal                       Personal Care Assistance Level of Assistance  Bathing, Dressing, Feeding Bathing Assistance: Maximum assistance Feeding assistance: Limited assistance Dressing Assistance: Maximum assistance     Functional Limitations Info  Sight, Hearing, Speech Sight Info: Adequate Hearing Info: Adequate Speech Info: Adequate    SPECIAL CARE FACTORS FREQUENCY                       Contractures Contractures Info: Not present    Additional Factors Info  Code Status, Allergies, Isolation Precautions Code Status Info: FULL Allergies Info: Cortisone, Fentanyl , Hydromorphone , Iodinated Contrast Media, Keflex  (Cephalexin ), Meperidine, Morphine, Oxycodone -acetaminophen , Penicillins, Afluria Preservative Free (Influenza Virus Vacc Split Pf), Aspirin , Codeine, Influenza Virus Vaccine, Meperidine Hcl, Oxycodone  Hcl, Shellfish Allergy, Sulfa  Antibiotics, Troleandomycin, Potassium Chloride , Bisphosphonates, Ciprofloxacin , Levaquin (Levofloxacin In D5w), Oxytetracycline     Isolation Precautions Info: ESBL, MRSA  Current Medications (03/31/2024):  This is the current hospital active medication list Current Facility-Administered Medications  Medication Dose Route Frequency Provider  Last Rate Last Admin   ALPRAZolam  (XANAX ) tablet 0.5 mg  0.5 mg Oral BID PRN Adefeso, Oladapo, DO   0.5 mg at 03/30/24 2228   amLODipine  (NORVASC ) tablet 5 mg  5 mg Oral Daily Sreeram, Narendranath, MD   5 mg at 03/31/24 9072   Chlorhexidine  Gluconate Cloth 2 % PADS 6 each  6 each Topical Q0600 Mcarthur Pick, MD   6 each at 03/31/24 0646   feeding supplement (ENSURE PLUS HIGH PROTEIN) liquid 237 mL  237 mL Oral BID BM Adefeso, Oladapo, DO       folic acid  (FOLVITE ) tablet 1 mg  1 mg Oral Daily Adefeso, Oladapo, DO   1 mg at 03/31/24 9071   lactated ringers  infusion   Intravenous Continuous Rayburn Pac, MD 100 mL/hr at 03/31/24 1045 New Bag at 03/31/24 1045   meropenem  (MERREM ) 1 g in sodium chloride  0.9 % 100 mL IVPB  1 g Intravenous Q12H Francesca Elsie CROME, MD 200 mL/hr at 03/31/24 0935 1 g at 03/31/24 0935   metoprolol  tartrate (LOPRESSOR ) tablet 12.5 mg  12.5 mg Oral BID Adefeso, Oladapo, DO   12.5 mg at 03/31/24 9072   ondansetron  (ZOFRAN ) tablet 4 mg  4 mg Oral Q6H PRN Adefeso, Oladapo, DO   4 mg at 03/29/24 2120   Or   ondansetron  (ZOFRAN ) injection 4 mg  4 mg Intravenous Q6H PRN Adefeso, Oladapo, DO   4 mg at 03/29/24 1221   pantoprazole  (PROTONIX ) EC tablet 40 mg  40 mg Oral Daily Adefeso, Oladapo, DO   40 mg at 03/31/24 9072   traMADol  (ULTRAM ) tablet 50 mg  50 mg Oral Q12H PRN Adefeso, Oladapo, DO   50 mg at 03/30/24 2228   vitamin B-12 (CYANOCOBALAMIN ) tablet 250 mcg  250 mcg Oral Daily Adefeso, Oladapo, DO   250 mcg at 03/31/24 9072     Discharge Medications: Please see discharge summary for a list of discharge medications.  Relevant Imaging Results:  Relevant Lab Results:   Additional Information SSN: [id number]  Hoy DELENA Bigness, LCSW

## 2024-03-31 NOTE — Progress Notes (Signed)
 PROGRESS NOTE    Robin Blackburn  FMW:[id number] DOB: 03-Aug-1945 DOA: 03/28/2024 PCP: Bertell Satterfield, MD   Brief Narrative:    Robin Blackburn is a 78 y.o. female with medical history significant of hypertension, rheumatoid arthritis, fibromyalgia, chronic pain, prior stroke, bladder outlet obstruction who presents to the emergency department via EMS due to abnormal lab.  Patient states that she was told that her kidney function was worsening, so she was sent to the ED for further evaluation and management.  She complained about nausea generalized weakness and abdominal pain but denies any discomfort with her urinary catheter.  Also denies fever, chills, cough, chest pain, shortness of breath, nausea, vomiting.  CT abdomen and pelvis without contrast showed stable bilateral hydroureteronephrosis showed concern for cystitis and urinary tract infection.  Bilateral pleural effusions, right greater than left, increased since prior exam. Foley catheter was changed, patient was started on IV meropenem , Zofran  was given due to nausea and IV hydration was provided.  Patient is admitted to the hospitalist service for further management evaluation of AKI, metabolic acidosis, UTI, pleural effusions.  Assessment & Plan:   Principal Problem:   Acute kidney injury superimposed on stage 5 chronic kidney disease, not on chronic dialysis (HCC) Active Problems:   Urinary tract infection   Essential hypertension   Rheumatoid arthritis (HCC)   GERD (gastroesophageal reflux disease)   Metabolic acidosis   Bilateral pleural effusion   Obstructive uropathy   Macrocytic anemia   Hypoalbuminemia due to protein-calorie malnutrition  Assessment and Plan:   Acute kidney injury superimposed on stage III chronic kidney disease Obstructive uropathy Creatinine 3.39 (baseline creatinine at 1.3-1.8) IV hydration was provided in the ED. Will stop fluids, she eating fair. Renally adjust medications, avoid nephrotoxic  agents/dehydration/hypotension Patient is agreeable to HD if needed, but appears to be a poor candidate per nephrology Nephrology recommending continued IV fluid as well as potential need for nephrostomy tubes.  Discussed with urology with plans to consult IR now for placement of nephrostomy tubes given persistent obstructive uropathy despite Foley catheter   Metabolic acidosis possibly secondary to above Low bicarb in the setting of kidney dysfunction. Low calcium , high phos noted. Appreciate ongoing nephrology evaluation   Urinalysis suspicious for UTI CT abdomen and pelvis without contrast was suggestive of UTI Urine culture done on 01/04/2024 was positive for Klebsiella pneumoniae (ESBL) which was only sensitive to imipenem and gentamicin. Meropenem  completed   Bilateral pleural effusion CT abdomen and pelvis without contrast showed bilateral pleural effusions, right greater than left, increased since prior exam. Right-sided thoracentesis done with removed. Follow fluid analysis.   Macrocytic anemia MCV 100.7, hemoglobin 8.9, hematocrit 29.4 Folate and vitamin B12 levels normal. Continue to monitor and transfuse as needed for hemoglobin less than 7   Hypoalbuminemia possibly secondary to moderate protein calorie malnutrition Albumin 2.7, protein supplement will be provided   Essential Hypertension Continue metoprolol  12.5 twice daily, increased Norvasc  to 5mg .   Rheumatoid arthritis Stable, Leflunomide  and adalimumab will be held in the setting of acute infection   GERD Continue Protonix    DVT prophylaxis: SCDs Code Status: Full Family Communication: None at bedside Disposition Plan:  Status is: Inpatient Remains inpatient appropriate because: Need for IV medications  Consultants:  Nephrology Discussed with Urology Dr. Sherrilee IR Palliative  Procedures:  None  Antimicrobials:  Anti-infectives (From admission, onward)    Start     Dose/Rate Route  Frequency Ordered Stop   03/28/24 2200  meropenem  (MERREM ) 1 g  in sodium chloride  0.9 % 100 mL IVPB  Status:  Discontinued        1 g 200 mL/hr over 30 Minutes Intravenous Every 8 hours 03/28/24 2036 03/28/24 2036   03/28/24 2200  meropenem  (MERREM ) 1 g in sodium chloride  0.9 % 100 mL IVPB        1 g 200 mL/hr over 30 Minutes Intravenous Every 12 hours 03/28/24 2036         Subjective: Patient seen and evaluated today with no new acute complaints or concerns. No acute concerns or events noted overnight.  Objective: Vitals:   03/30/24 1223 03/30/24 1949 03/31/24 0342 03/31/24 0941  BP: (!) 159/80 (!) 157/74 (!) 150/81 (!) 144/67  Pulse: 95 (!) 103 93 100  Resp: 20 18 18 20   Temp: 98.1 F (36.7 C) 98.8 F (37.1 C) 98.7 F (37.1 C) 99.1 F (37.3 C)  TempSrc: Oral Oral Oral Oral  SpO2: 94% 95% 93% 95%  Weight:      Height:        Intake/Output Summary (Last 24 hours) at 03/31/2024 1230 Last data filed at 03/31/2024 0602 Gross per 24 hour  Intake 980 ml  Output 1700 ml  Net -720 ml   Filed Weights   03/28/24 1440  Weight: 72 kg    Examination:  General exam: Appears calm and comfortable  Respiratory system: Clear to auscultation. Respiratory effort normal. Cardiovascular system: S1 & S2 heard, RRR.  Gastrointestinal system: Abdomen is soft Central nervous system: Alert and awake Extremities: No edema Skin: No significant lesions noted Psychiatry: Flat affect. Foley with clear, yellow urine output noted    Data Reviewed: I have personally reviewed following labs and imaging studies  CBC: Recent Labs  Lab 03/28/24 1617 03/29/24 0529 03/30/24 0438 03/31/24 0404  WBC 9.2 10.0 8.7 8.0  NEUTROABS 6.9  --  6.2  --   HGB 8.9* 9.1* 8.3* 7.6*  HCT 29.4* 29.6* 26.6* 24.7*  MCV 100.7* 100.3* 99.3 98.4  PLT 465* 494* 439* 352   Basic Metabolic Panel: Recent Labs  Lab 03/28/24 1617 03/29/24 0529 03/30/24 0438 03/31/24 0404  NA 135 138 139 136  K 4.2 4.1  3.8 3.7  CL 108 110 111 109  CO2 15* 15* 16* 16*  GLUCOSE 116* 98 101* 112*  BUN 35* 35* 34* 33*  CREATININE 3.98* 3.68* 3.80* 3.39*  CALCIUM  7.9* 8.0* 7.9* 7.7*  MG  --  2.0  --   --   PHOS  --  4.8*  --   --    GFR: Estimated Creatinine Clearance: 13.9 mL/min (A) (by C-G formula based on SCr of 3.39 mg/dL (H)). Liver Function Tests: Recent Labs  Lab 03/28/24 1617 03/29/24 0529  AST <10* <10*  ALT <5 <5  ALKPHOS 143* 137*  BILITOT <0.2 <0.2  PROT 6.0* 5.9*  ALBUMIN 2.7* 2.6*   Recent Labs  Lab 03/28/24 1617  LIPASE 19   No results for input(s): AMMONIA in the last 168 hours. Coagulation Profile: No results for input(s): INR, PROTIME in the last 168 hours. Cardiac Enzymes: No results for input(s): CKTOTAL, CKMB, CKMBINDEX, TROPONINI in the last 168 hours. BNP (last 3 results) No results for input(s): PROBNP in the last 8760 hours. HbA1C: No results for input(s): HGBA1C in the last 72 hours. CBG: No results for input(s): GLUCAP in the last 168 hours. Lipid Profile: No results for input(s): CHOL, HDL, LDLCALC, TRIG, CHOLHDL, LDLDIRECT in the last 72 hours. Thyroid  Function Tests: No results  for input(s): TSH, T4TOTAL, FREET4, T3FREE, THYROIDAB in the last 72 hours. Anemia Panel: Recent Labs    03/28/24 1617  VITAMINB12 1,538*  FOLATE 15.2   Sepsis Labs: No results for input(s): PROCALCITON, LATICACIDVEN in the last 168 hours.  Recent Results (from the past 240 hours)  Urine Culture     Status: None   Collection Time: 03/28/24  7:20 PM   Specimen: Urine, Random  Result Value Ref Range Status   Specimen Description   Final    URINE, RANDOM Performed at [facility], [address] [place], [place] [address]    Special Requests   Final    NONE Reflexed from [id number] Performed at [facility], [address], [place], [place] [address]    Culture   Final    NO GROWTH Performed at [facility], [address], [place], [place] [address]    Report Status 03/30/2024 FINAL  Final  Culture, body fluid w Gram Stain-bottle     Status: None (Preliminary result)   Collection Time: 03/30/24  9:05 AM   Specimen: Pleura  Result Value Ref Range Status   Specimen Description PLEURAL RIGHT  Final   Special Requests   Final    BOTTLES DRAWN AEROBIC AND ANAEROBIC Blood Culture adequate volume   Culture   Final    NO GROWTH < 24 HOURS Performed at [facility], [address] [place], [place] [address]    Report Status PENDING  Incomplete  Gram stain     Status: None   Collection Time: 03/30/24  9:05 AM   Specimen: Pleura  Result Value Ref Range Status   Specimen Description PLEURAL  Final   Special Requests NONE  Final   Gram Stain   Final    CYTOSPIN SMEAR NO ORGANISMS SEEN WBC PRESENT,BOTH PMN AND MONONUCLEAR Performed at [facility], [address], [place], [place] [address]    Report Status 03/30/2024 FINAL  Final         Radiology Studies: US  THORACENTESIS ASP PLEURAL SPACE W/IMG GUIDE Result Date: 03/30/2024 INDICATION: 78 year old female. Admitted for abnormal lab value. Found to have a right-sided pleural effusion. Request is for therapeutic and diagnostic right sided thoracentesis EXAM: ULTRASOUND GUIDED THERAPEUTIC AND DIAGNOSTIC RIGHT-SIDED THORACENTESIS MEDICATIONS: Lidocaine  1% 10 mL COMPLICATIONS: None immediate. PROCEDURE: An ultrasound guided thoracentesis was thoroughly discussed with the patient and questions answered. The benefits, risks, alternatives and complications were also discussed. The patient understands and wishes to proceed with the procedure. Written consent was obtained. Ultrasound was performed to localize and mark an adequate pocket of fluid in the right chest. The area was then prepped and draped in the normal sterile fashion. 1% Lidocaine  was used for local anesthesia. Under ultrasound guidance a 6 Fr Safe-T-Centesis catheter was introduced. Thoracentesis was  performed. The catheter was removed and a dressing applied. FINDINGS: A total of approximately 600 mL of pale yellow fluid was removed. Samples were sent to the laboratory as requested by the clinical team. IMPRESSION: Successful ultrasound guided therapeutic and diagnostic right-sided thoracentesis yielding 600 mL of pale yellow pleural fluid. Performed by Delon Beagle NP Electronically Signed   By: Ester Sides M.D.   On: 03/30/2024 11:14   DG Chest 1 View Result Date: 03/30/2024 CLINICAL DATA:  Post thoracentesis today. EXAM: CHEST  1 VIEW COMPARISON:  Chest x-ray 02/18/2024, CT 03/28/2024 FINDINGS: Left-sided PICC line is present with tip over the axillary vein. Patient is rotated to the left. Lungs are hypoinflated. No  significant right-sided effusion post thoracentesis. No pneumothorax. Minimal patchy density right base likely atelectasis. Minimal stable tenting of the left hemidiaphragm. Left lung otherwise clear. Cardiomediastinal silhouette and remainder of the exam is unchanged. IMPRESSION: Hypoinflation with minimal patchy density right base likely atelectasis. No significant right-sided effusion post thoracentesis. No pneumothorax. Electronically Signed   By: Toribio Agreste M.D.   On: 03/30/2024 09:34        Scheduled Meds:  amLODipine   5 mg Oral Daily   Chlorhexidine  Gluconate Cloth  6 each Topical Q0600   feeding supplement  237 mL Oral BID BM   folic acid   1 mg Oral Daily   metoprolol  tartrate  12.5 mg Oral BID   pantoprazole   40 mg Oral Daily   vitamin B-12  250 mcg Oral Daily   Continuous Infusions:  lactated ringers  100 mL/hr at 03/31/24 1045   meropenem  (MERREM ) IV 1 g (03/31/24 0935)     LOS: 3 days    Time spent: 55 minutes    Terryn Rosenkranz D Maree, DO [facility]  If 7PM-7AM, please contact night-coverage [email or web address] 03/31/2024, 12:30 PM

## 2024-03-31 NOTE — Progress Notes (Signed)
 Patient tentatively scheduled 04/01/24 for bilateral PCN placement at [facility]. Patient will be transported to [facility] via [facility] on first available truck tomorrow. She will return to [facility] post-procedure. Patient NPO at midnight, morning labs ordered. Ceftriaxone  ordered for surgical prophylaxis and this will be given in IR pre-procedure.  Per Dempsey, [facility] this patient has received ceftriaxone  multiple times in the past and it is safe to administer this medication. Patient is not receiving any blood-thinning medications.  Warren Dais, AGACNP-BC 03/31/2024, 3:56 PM

## 2024-04-01 ENCOUNTER — Ambulatory Visit (HOSPITAL_COMMUNITY)

## 2024-04-01 DIAGNOSIS — N139 Obstructive and reflux uropathy, unspecified: Secondary | ICD-10-CM | POA: Insufficient documentation

## 2024-04-01 DIAGNOSIS — N185 Chronic kidney disease, stage 5: Secondary | ICD-10-CM | POA: Diagnosis not present

## 2024-04-01 DIAGNOSIS — N179 Acute kidney failure, unspecified: Secondary | ICD-10-CM | POA: Diagnosis not present

## 2024-04-01 HISTORY — PX: IR NEPHROSTOMY PLACEMENT RIGHT: IMG6064

## 2024-04-01 LAB — RENAL FUNCTION PANEL
Albumin: 2.4 g/dL — ABNORMAL LOW (ref 3.5–5.0)
Anion gap: 10 (ref 5–15)
BUN: 30 mg/dL — ABNORMAL HIGH (ref 8–23)
CO2: 18 mmol/L — ABNORMAL LOW (ref 22–32)
Calcium: 7.6 mg/dL — ABNORMAL LOW (ref 8.9–10.3)
Chloride: 108 mmol/L (ref 98–111)
Creatinine, Ser: 2.68 mg/dL — ABNORMAL HIGH (ref 0.44–1.00)
GFR, Estimated: 18 mL/min — ABNORMAL LOW (ref >60)
Glucose, Bld: 106 mg/dL — ABNORMAL HIGH (ref 70–99)
Phosphorus: 2.8 mg/dL (ref 2.5–4.6)
Potassium: 4 mmol/L (ref 3.5–5.1)
Sodium: 136 mmol/L (ref 135–145)

## 2024-04-01 LAB — CBC
HCT: 24.6 % — ABNORMAL LOW (ref 36.0–46.0)
Hemoglobin: 7.7 g/dL — ABNORMAL LOW (ref 12.0–15.0)
MCH: 30.8 pg (ref 26.0–34.0)
MCHC: 31.3 g/dL (ref 30.0–36.0)
MCV: 98.4 fL (ref 80.0–100.0)
Platelets: 369 K/uL (ref 150–400)
RBC: 2.5 MIL/uL — ABNORMAL LOW (ref 3.87–5.11)
RDW: 15.9 % — ABNORMAL HIGH (ref 11.5–15.5)
WBC: 8.8 K/uL (ref 4.0–10.5)
nRBC: 0 % (ref 0.0–0.2)

## 2024-04-01 LAB — PROTIME-INR
INR: 1.8 — ABNORMAL HIGH (ref 0.8–1.2)
Prothrombin Time: 21.7 s — ABNORMAL HIGH (ref 11.4–15.2)

## 2024-04-01 LAB — MAGNESIUM: Magnesium: 1.8 mg/dL (ref 1.7–2.4)

## 2024-04-01 MED ORDER — PROCHLORPERAZINE EDISYLATE 10 MG/2ML IJ SOLN
10.0000 mg | Freq: Once | INTRAMUSCULAR | Status: AC
Start: 2024-04-01 — End: 2024-04-01
  Administered 2024-04-01: 10 mg via INTRAVENOUS
  Filled 2024-04-01: qty 2

## 2024-04-01 MED ORDER — PROCHLORPERAZINE EDISYLATE 10 MG/2ML IJ SOLN
10.0000 mg | Freq: Once | INTRAMUSCULAR | Status: AC
  Administered 2024-04-01: 10 mg via INTRAVENOUS
  Filled 2024-04-01: qty 2

## 2024-04-01 MED ORDER — ACETAMINOPHEN 500 MG PO TABS: 1000.0000 mg | ORAL_TABLET | Freq: Four times a day (QID) | ORAL | Status: DC | PRN

## 2024-04-01 MED ORDER — SODIUM CHLORIDE 0.9 % IV SOLN
INTRAVENOUS | Status: AC
  Filled 2024-04-01: qty 20

## 2024-04-01 MED ORDER — LIDOCAINE HCL 1 % IJ SOLN
20.0000 mL | Freq: Once | INTRAMUSCULAR | Status: DC
Start: 2024-04-01 — End: 2024-04-05
  Filled 2024-04-01: qty 20

## 2024-04-01 MED ORDER — FLUCONAZOLE IN SODIUM CHLORIDE 200-0.9 MG/100ML-% IV SOLN
200.0000 mg | INTRAVENOUS | Status: DC
  Administered 2024-04-01 – 2024-04-03 (×3): 200 mg via INTRAVENOUS
  Filled 2024-04-01 (×3): qty 100

## 2024-04-01 MED ORDER — ALPRAZOLAM 1 MG PO TABS
1.0000 mg | ORAL_TABLET | Freq: Once | ORAL | Status: AC
  Administered 2024-04-01: 1 mg via ORAL
  Filled 2024-04-01: qty 1

## 2024-04-01 MED ORDER — METOPROLOL TARTRATE 5 MG/5ML IV SOLN
5.0000 mg | Freq: Once | INTRAVENOUS | Status: AC
  Administered 2024-04-01: 5 mg via INTRAVENOUS
  Filled 2024-04-01: qty 5

## 2024-04-01 MED ORDER — DIPHENHYDRAMINE HCL 50 MG/ML IJ SOLN
INTRAMUSCULAR | Status: AC
Start: 2024-04-01 — End: 2024-04-01
  Filled 2024-04-01: qty 1

## 2024-04-01 MED ORDER — LACTATED RINGERS IV BOLUS: 250.0000 mL | Freq: Once | INTRAVENOUS | Status: DC

## 2024-04-01 MED ORDER — GADOBUTROL 1 MMOL/ML IV SOLN: 20.0000 mL | Freq: Once | INTRAVENOUS | Status: DC | PRN

## 2024-04-01 MED ORDER — LIDOCAINE HCL 1 % IJ SOLN
INTRAMUSCULAR | Status: AC
  Filled 2024-04-01: qty 20

## 2024-04-01 MED ORDER — MEROPENEM 500 MG IV SOLR
500.0000 mg | Freq: Two times a day (BID) | INTRAVENOUS | Status: DC
  Administered 2024-04-02 – 2024-04-03 (×4): 500 mg via INTRAVENOUS
  Filled 2024-04-01: qty 10
  Filled 2024-04-01: qty 500
  Filled 2024-04-01: qty 10
  Filled 2024-04-01: qty 500
  Filled 2024-04-01 (×3): qty 10

## 2024-04-01 MED ORDER — FLUCONAZOLE IN SODIUM CHLORIDE 400-0.9 MG/200ML-% IV SOLN: 400.0000 mg | INTRAVENOUS | Status: DC

## 2024-04-01 MED ORDER — ACETAMINOPHEN 10 MG/ML IV SOLN
1000.0000 mg | Freq: Four times a day (QID) | INTRAVENOUS | Status: AC
  Administered 2024-04-01: 1000 mg via INTRAVENOUS
  Filled 2024-04-01: qty 100

## 2024-04-01 MED ORDER — DIPHENHYDRAMINE HCL 50 MG/ML IJ SOLN
INTRAMUSCULAR | Status: AC | PRN
  Administered 2024-04-01: 50 mg via INTRAVENOUS

## 2024-04-01 NOTE — Clinical Note (Incomplete)
       CROSS COVER NOTE  NAME: Robin Blackburn MRN: [id number] DOB : [birth year] ATTENDING PHYSICIAN: Maree Bracken D, DO    Date of Service   04/01/2024   HPI/Events of Note   TRH Cross Cover at [facility] received from nurse patient red mews dut to HR 155 and temp 102.7; active vomiting. (Threw up her xanax  and metoprolol .  HPI:  78 year old female who had  bilateral percutaneous nephrostomy tubes placed in IR at [facility] today for treatment of outlet obstruction causing bilateral hydroureter nephrosis. . Chronic conditions reviewed.  Preliminary culture  report from turbid pussy fluid obtained from right kidney showed rare budding yeast and pmn; left kidney with abundant white cells moderate yeast Urinalysis on 10/20 with rare bacteria, no yeast, no nitrate, mod white cells on micro.  Interventions   Assessment/Plan:    04/01/2024   10:00 PM 04/01/2024    2:34 PM 04/01/2024   12:03 PM  Vitals with BMI  Systolic 153 142 866  Diastolic 83 81 78  Pulse 159 104 104   TEMP                      102.7 EKG X X X

## 2024-04-01 NOTE — Discharge Instructions (Addendum)
 The Interventional Radiology Team was involved in your care this visit and placed bilateral nephrostomy tubes. With any concerns or questions related to this procedure after discharge, you can reach the Interventional Radiology Patient Care Line at [phone] during business hours.  You will need to perform dressing changes every other day or as soiled. Empty the drainage bags prior to becoming completely full, as needed. Record daily output. These tubes will need exchange in 8 weeks. IR will contact you to schedule this.

## 2024-04-01 NOTE — TOC Progression Note (Signed)
 Transition of Care [facility]) - Progression Note    Patient Details  Name: Robin Blackburn MRN: [id number] Date of Birth: [birth year]  [facility] CM/SW Contact  Hoy DELENA Bigness, LCSW Phone Number: 04/01/2024, 10:14 AM  Clinical Narrative:    Confirmed with Debbie at [facility] pt able to return to their facility over the weekend if medically stable.    Expected Discharge Plan: Skilled Nursing Facility Barriers to Discharge: Continued Medical Work up               Expected Discharge Plan and Services     Post Acute Care Choice: Skilled Nursing Facility Living arrangements for the past 2 months: Skilled Nursing Facility                                       Social Drivers of Health (SDOH) Interventions SDOH Screenings   Food Insecurity: Patient Declined (03/29/2024)  Housing: Low Risk  (03/29/2024)  Transportation Needs: No Transportation Needs (03/29/2024)  Utilities: Not At Risk (03/29/2024)  Alcohol  Screen: Low Risk  (01/06/2022)  Depression (PHQ2-9): Low Risk  (02/25/2024)  Financial Resource Strain: Low Risk  (01/06/2022)  Physical Activity: Inactive (11/07/2022)   Received from [facility]  Social Connections: Socially Isolated (03/29/2024)  Stress: No Stress Concern Present (01/06/2022)  Tobacco Use: Low Risk  (03/28/2024)    Readmission Risk Interventions    03/29/2024    9:23 AM 03/11/2024    8:52 AM 01/09/2024   11:45 AM  Readmission Risk Prevention Plan  Transportation Screening Complete Complete Complete  Home Care Screening   Complete  Medication Review (RN CM)   Complete  Medication Review Oceanographer) Complete Complete   HRI or Home Care Consult Complete Complete   SW Recovery Care/Counseling Consult Complete Complete   Palliative Care Screening Not Applicable Not Applicable   Skilled Nursing Facility Complete Complete

## 2024-04-01 NOTE — Plan of Care (Signed)
  Problem: Health Behavior/Discharge Planning: Goal: Ability to manage health-related needs will improve Outcome: Progressing   Problem: Clinical Measurements: Goal: Ability to maintain clinical measurements within normal limits will improve Outcome: Progressing Goal: Will remain free from infection Outcome: Progressing Goal: Diagnostic test results will improve Outcome: Progressing Goal: Respiratory complications will improve Outcome: Progressing Goal: Cardiovascular complication will be avoided Outcome: Progressing   Problem: Nutrition: Goal: Adequate nutrition will be maintained Outcome: Progressing   Problem: Coping: Goal: Level of anxiety will decrease Outcome: Progressing   Problem: Elimination: Goal: Will not experience complications related to bowel motility Outcome: Progressing Goal: Will not experience complications related to urinary retention Outcome: Progressing   Problem: Safety: Goal: Ability to remain free from injury will improve Outcome: Progressing   Problem: Skin Integrity: Goal: Risk for impaired skin integrity will decrease Outcome: Progressing   

## 2024-04-01 NOTE — Progress Notes (Signed)
 PHARMACY NOTE:  ANTIMICROBIAL RENAL DOSAGE ADJUSTMENT  Current antimicrobial regimen includes a mismatch between antimicrobial dosage and estimated renal function.  As per policy approved by the Pharmacy & Therapeutics and Medical Executive Committees, the antimicrobial dosage will be adjusted accordingly.  Current antimicrobial dosage:  Fluconazole 400mg  IV q24h  Indication: moderate yeast in wound culture; yeast seen in body fluid culture   Renal Function:  Estimated Creatinine Clearance: 17.6 mL/min (A) (by C-G formula based on SCr of 2.68 mg/dL (H)).      Antimicrobial dosage has been changed to:  Fluconazole 200mg  IV q24h   Thank you for allowing pharmacy to be a part of this patient's care.  Arvin Gauss, PharmD 04/01/2024 10:59 PM

## 2024-04-01 NOTE — Procedures (Signed)
 Interventional Radiology Procedure Note  Procedure: bilateral PCN placement  Complications: None  Estimated Blood Loss: < 10 mL  Findings: 10 FR PCN placement.  Urine and pus obtained bilaterally.  Samples obtained and sent to Pathology.  Cordella DELENA Banner, MD

## 2024-04-01 NOTE — Progress Notes (Signed)
 PROGRESS NOTE    Robin Blackburn  FMW:[id number] DOB: 1945/06/13 DOA: 03/28/2024 PCP: Bertell Satterfield, MD   Brief Narrative:    Robin Blackburn is a 78 y.o. female with medical history significant of hypertension, rheumatoid arthritis, fibromyalgia, chronic pain, prior stroke, bladder outlet obstruction who presents to the emergency department via EMS due to abnormal lab.  Patient states that she was told that her kidney function was worsening, so she was sent to the ED for further evaluation and management.  She complained about nausea generalized weakness and abdominal pain but denies any discomfort with her urinary catheter.  Also denies fever, chills, cough, chest pain, shortness of breath, nausea, vomiting.  CT abdomen and pelvis without contrast showed stable bilateral hydroureteronephrosis showed concern for cystitis and urinary tract infection.  Bilateral pleural effusions, right greater than left, increased since prior exam. Foley catheter was changed, patient was started on IV meropenem , Zofran  was given due to nausea and IV hydration was provided.  Patient is admitted to the hospitalist service for further management evaluation of AKI, metabolic acidosis, UTI, pleural effusions.  Assessment & Plan:   Principal Problem:   Acute kidney injury superimposed on stage 5 chronic kidney disease, not on chronic dialysis [facility]) Active Problems:   Urinary tract infection   Essential hypertension   Rheumatoid arthritis (HCC)   GERD (gastroesophageal reflux disease)   Metabolic acidosis   Bilateral pleural effusion   Obstructive uropathy   Macrocytic anemia   Hypoalbuminemia due to protein-calorie malnutrition  Assessment and Plan:   Acute kidney injury superimposed on stage III chronic kidney disease Obstructive uropathy status post bilateral percutaneous nephrostomy tube placement 10/24 Creatinine downward trending (baseline creatinine at 1.3-1.8) continue to monitor in a.m. IV hydration  was provided in the ED. Will stop fluids, she eating fair. Renally adjust medications, avoid nephrotoxic agents/dehydration/hypotension Patient is agreeable to HD if needed, but appears to be a poor candidate per nephrology Discontinue Foley catheter   Metabolic acidosis possibly secondary to above-improving Low bicarb in the setting of kidney dysfunction. Low calcium , high phos noted. Appreciate ongoing nephrology evaluation   Urinalysis suspicious for UTI CT abdomen and pelvis without contrast was suggestive of UTI Urine culture done on 01/04/2024 was positive for Klebsiella pneumoniae (ESBL) which was only sensitive to imipenem and gentamicin. Meropenem  completed   Bilateral pleural effusion CT abdomen and pelvis without contrast showed bilateral pleural effusions, right greater than left, increased since prior exam. Right-sided thoracentesis done with removed. Follow fluid analysis.   Macrocytic anemia MCV 100.7, hemoglobin 8.9, hematocrit 29.4 Folate and vitamin B12 levels normal. Continue to monitor and transfuse as needed for hemoglobin less than 7   Hypoalbuminemia possibly secondary to moderate protein calorie malnutrition Albumin 2.7, protein supplement will be provided   Essential Hypertension Continue metoprolol  12.5 twice daily, increased Norvasc  to 5mg .   Rheumatoid arthritis Stable, Leflunomide  and adalimumab will be held in the setting of acute infection   GERD Continue Protonix    DVT prophylaxis: SCDs Code Status: Full Family Communication: None at bedside Disposition Plan:  Status is: Inpatient Remains inpatient appropriate because: Need for IV medications  Consultants:  Nephrology Discussed with Urology Dr. Sherrilee IR Palliative  Procedures:  None  Antimicrobials:  Anti-infectives (From admission, onward)    Start     Dose/Rate Route Frequency Ordered Stop   04/01/24 1000  cefTRIAXone  (ROCEPHIN ) 2 g in sodium chloride  0.9 % 100 mL IVPB         2 g 200  mL/hr over 30 Minutes Intravenous To Radiology 03/31/24 1554 04/01/24 1025   03/28/24 2200  meropenem  (MERREM ) 1 g in sodium chloride  0.9 % 100 mL IVPB  Status:  Discontinued        1 g 200 mL/hr over 30 Minutes Intravenous Every 8 hours 03/28/24 2036 03/28/24 2036   03/28/24 2200  meropenem  (MERREM ) 1 g in sodium chloride  0.9 % 100 mL IVPB  Status:  Discontinued        1 g 200 mL/hr over 30 Minutes Intravenous Every 12 hours 03/28/24 2036 03/31/24 1243       Subjective: Patient seen and evaluated today with no new acute complaints or concerns. No acute concerns or events noted overnight.  She has returned from bilateral nephrostomy tube placement and is overall doing well.  Objective: Vitals:   04/01/24 1030 04/01/24 1035 04/01/24 1055 04/01/24 1203  BP: (!) 154/87 (!) 150/90 (!) 142/87 133/78  Pulse: (!) 113 (!) 113 (!) 110 (!) 104  Resp: (!) 23 (!) 28 (!) 25 16  Temp:    98.1 F (36.7 C)  TempSrc:    Oral  SpO2: 96% 96% 95% 95%  Weight:      Height:        Intake/Output Summary (Last 24 hours) at 04/01/2024 1315 Last data filed at 04/01/2024 0816 Gross per 24 hour  Intake 1728.31 ml  Output 1750 ml  Net -21.69 ml   Filed Weights   03/28/24 1440  Weight: 72 kg    Examination:  General exam: Appears calm and comfortable  Respiratory system: Clear to auscultation. Respiratory effort normal. Cardiovascular system: S1 & S2 heard, RRR.  Gastrointestinal system: Abdomen is soft Central nervous system: Alert and awake Extremities: No edema Skin: No significant lesions noted Psychiatry: Flat affect. Foley with clear, yellow urine output noted Nephrostomy tubes with clear, yellow urine output noted    Data Reviewed: I have personally reviewed following labs and imaging studies  CBC: Recent Labs  Lab 03/28/24 1617 03/29/24 0529 03/30/24 0438 03/31/24 0404 04/01/24 0412  WBC 9.2 10.0 8.7 8.0 8.8  NEUTROABS 6.9  --  6.2  --   --   HGB 8.9* 9.1*  8.3* 7.6* 7.7*  HCT 29.4* 29.6* 26.6* 24.7* 24.6*  MCV 100.7* 100.3* 99.3 98.4 98.4  PLT 465* 494* 439* 352 369   Basic Metabolic Panel: Recent Labs  Lab 03/28/24 1617 03/29/24 0529 03/30/24 0438 03/31/24 0404 04/01/24 0412  NA 135 138 139 136 136  K 4.2 4.1 3.8 3.7 4.0  CL 108 110 111 109 108  CO2 15* 15* 16* 16* 18*  GLUCOSE 116* 98 101* 112* 106*  BUN 35* 35* 34* 33* 30*  CREATININE 3.98* 3.68* 3.80* 3.39* 2.68*  CALCIUM  7.9* 8.0* 7.9* 7.7* 7.6*  MG  --  2.0  --   --  1.8  PHOS  --  4.8*  --   --  2.8   GFR: Estimated Creatinine Clearance: 17.6 mL/min (A) (by C-G formula based on SCr of 2.68 mg/dL (H)). Liver Function Tests: Recent Labs  Lab 03/28/24 1617 03/29/24 0529 04/01/24 0412  AST <10* <10*  --   ALT <5 <5  --   ALKPHOS 143* 137*  --   BILITOT <0.2 <0.2  --   PROT 6.0* 5.9*  --   ALBUMIN 2.7* 2.6* 2.4*   Recent Labs  Lab 03/28/24 1617  LIPASE 19   No results for input(s): AMMONIA in the last 168 hours. Coagulation Profile: Recent Labs  Lab 04/01/24 0412  INR 1.8*   Cardiac Enzymes: No results for input(s): CKTOTAL, CKMB, CKMBINDEX, TROPONINI in the last 168 hours. BNP (last 3 results) No results for input(s): PROBNP in the last 8760 hours. HbA1C: No results for input(s): HGBA1C in the last 72 hours. CBG: No results for input(s): GLUCAP in the last 168 hours. Lipid Profile: No results for input(s): CHOL, HDL, LDLCALC, TRIG, CHOLHDL, LDLDIRECT in the last 72 hours. Thyroid  Function Tests: No results for input(s): TSH, T4TOTAL, FREET4, T3FREE, THYROIDAB in the last 72 hours. Anemia Panel: No results for input(s): VITAMINB12, FOLATE, FERRITIN, TIBC, IRON, RETICCTPCT in the last 72 hours.  Sepsis Labs: No results for input(s): PROCALCITON, LATICACIDVEN in the last 168 hours.  Recent Results (from the past 240 hours)  Urine Culture     Status: None   Collection Time: 03/28/24  7:20 PM    Specimen: Urine, Random  Result Value Ref Range Status   Specimen Description   Final    URINE, RANDOM Performed at [facility], [address] [place], [place] [address]    Special Requests   Final    NONE Reflexed from [id number] Performed at [facility], [address] [place], [place] [address]    Culture   Final    NO GROWTH Performed at [facility], [address] [place], [place] [address]    Report Status 03/30/2024 FINAL  Final  Culture, body fluid w Gram Stain-bottle     Status: None (Preliminary result)   Collection Time: 03/30/24  9:05 AM   Specimen: Pleura  Result Value Ref Range Status   Specimen Description PLEURAL RIGHT  Final   Special Requests   Final    BOTTLES DRAWN AEROBIC AND ANAEROBIC Blood Culture adequate volume   Culture   Final    NO GROWTH 2 DAYS Performed at [facility], [address], [place], [place] [address]    Report Status PENDING  Incomplete  Gram stain     Status: None   Collection Time: 03/30/24  9:05 AM   Specimen: Pleura  Result Value Ref Range Status   Specimen Description PLEURAL  Final   Special Requests NONE  Final   Gram Stain   Final    CYTOSPIN SMEAR NO ORGANISMS SEEN WBC PRESENT,BOTH PMN AND MONONUCLEAR Performed at [facility], [address] [place], [place] [address]    Report Status 03/30/2024 FINAL  Final  Aerobic/Anaerobic Culture w Gram Stain (surgical/deep wound)     Status: None (Preliminary result)   Collection Time: 04/01/24 10:40 AM   Specimen: Kidney  Result Value Ref Range Status   Specimen Description KIDNEY  Final   Special Requests LEFT  Final   Gram Stain   Final    ABUNDANT WBC PRESENT, PREDOMINANTLY PMN MODERATE YEAST Performed at [facility], [address], [place], [place] [address]    Culture PENDING  Incomplete   Report Status PENDING  Incomplete         Radiology Studies: IR NEPHROSTOMY PLACEMENT BILATERAL Result Date: 04/01/2024 INDICATION: Obstructive uropathy and  hydronephrosis. EXAM: Ultrasound and fluoroscopic guided percutaneous nephrostomy tube placement COMPARISON:  None Available. MEDICATIONS: 2 g IV Rocephin ; The antibiotic was administered in an appropriate time frame prior to skin puncture. CONTRAST:  3 mL Gadavist-administered into the collecting system(s) FLUOROSCOPY: Radiation Exposure Index (as provided by the fluoroscopic device): 13 mGy Kerma COMPLICATIONS: None immediate. PROCEDURE: Informed written consent was obtained from the patient after a thorough discussion  of the procedural risks, benefits and alternatives. All questions were addressed. Maximal Sterile Barrier Technique was utilized including caps, mask, sterile gowns, sterile gloves, sterile drape, hand hygiene and skin antiseptic. A timeout was performed prior to the initiation of the procedure. Bilateral flank regions were prepped and draped in usual sterile fashion. Local anesthesia was achieved by infiltrating subcutaneous tissue of the flank regions to the renal cortex with 1% lidocaine . A small incision was made in the left flank region and an Accustick needle was advanced under ultrasound guidance. The patient had hydronephrosis in the needle tip was directed to a dilated renal calyx. Once visualized within the calyx, the stylet was removed leaving the needle behind. Pus was obtained. A small volume of gadolinium was then injected under fluoroscopic guidance filling the calices and dilated renal pelvis. An 018 guidewire was then advanced into the proximal ureter. The Accustick sheath was then advanced over the guidewire into the proximal ureter. An 035 guidewire was then advanced through the sheath into the proximal ureter. The access site was dilated. A 10 French pigtail catheter was then advanced over the guidewire and coiled within the renal pelvis. A small sample of fluid was then collected and sent to pathology for culture and sensitivity. Accustick needle was then advanced through a  small incision in the right flank region using ultrasound guidance. When the needle tip was identified within the dilated renal collecting system, the stylet was removed and again pus was obtained. A small volume of dilute gadolinium was injected in order to perform an antegrade pyelogram which demonstrated hydronephrosis and a dilated renal pelvis. Access was then exchanged for an 035 guidewire which was advanced into the proximal ureter. The access site was dilated and a 10 French pigtail catheter was advanced over the guidewire. The pigtail catheters were locked in position. Retention sutures were applied bilaterally. Sterile dressing was applied bilaterally. Catheters were connected to gravity drainage. IMPRESSION: Bilateral 10 French percutaneous nephrostomy tubes placed as described above. Pus was removed from the collecting system bilaterally and sent to pathology for culture and sensitivity. Electronically Signed   By: Cordella Banner   On: 04/01/2024 12:41        Scheduled Meds:  amLODipine   5 mg Oral Daily   Chlorhexidine  Gluconate Cloth  6 each Topical Q0600   feeding supplement  237 mL Oral BID BM   folic acid   1 mg Oral Daily   lidocaine   20 mL Intradermal Once   metoprolol  tartrate  12.5 mg Oral BID   pantoprazole   40 mg Oral Daily   vitamin B-12  250 mcg Oral Daily     LOS: 4 days    Time spent: 55 minutes    Taylin Mans JONETTA Fairly, DO [facility]  If 7PM-7AM, please contact night-coverage [email or web address] 04/01/2024, 1:15 PM

## 2024-04-01 NOTE — Plan of Care (Signed)

## 2024-04-01 NOTE — Consult Note (Signed)
 Chief Complaint: Persistent obstructive uropathy  Referring Provider(s): Dr. Maree  Supervising Physician: Jenna Hacker  Patient Status: [facility] - In-pt  History of Present Illness: Robin Blackburn is a 78 y.o. female with PMH significant for HTN, RA, fibromyalgia, chronic pain, CVA, bladder outlet obstruction. She was directed to ED from SNF for worsening kidney function. Patient was admitted to the hospitalist service for further management evaluation of AKI, metabolic acidosis, UTI, pleural effusions. CT abdomen and pelvis without contrast showed stable bilateral hydroureteronephrosis. Urology consulted and recommended bilateral PCN placement given persistent obstructive uropathy despite Foley catheter. IR consulted, and patient transported from [facility] to [facility] for this procedure this AM.   Confirms NPO since MN. Does not use supplemental O2.   Denies chills, SOB, CP, sore throat, blood in stool or urine today. She did experience nausea, fever, and distention last night though these have improved. Lower abd pain persists.   Allergies Reviewed:  Cortisone, Fentanyl , Hydromorphone , Iodinated contrast media, Keflex  [cephalexin ], Meperidine, Morphine, Oxycodone -acetaminophen , Penicillins, Afluria preservative free [influenza virus vacc split pf], Aspirin , Codeine, Influenza virus vaccine, Meperidine hcl, Oxycodone  hcl, Shellfish allergy, Sulfa  antibiotics, Troleandomycin, Potassium chloride , Bisphosphonates, Ciprofloxacin , Levaquin [levofloxacin in d5w], and Oxytetracycline   Patient is Full Code  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   Depression    Fibromyalgia    Hypertension    Neuropathy    Rheumatoid arthritis (HCC)    Stroke (HCC) 02/17/14    Past Surgical History:  Procedure Laterality Date   ABDOMINAL HYSTERECTOMY     ANKLE RECONSTRUCTION     APPENDECTOMY     BACK SURGERY      CHOLECYSTECTOMY     KNEE SURGERY        Medications: Prior to Admission medications   Medication Sig Start Date End Date Taking? Authorizing Provider  Adalimumab (HUMIRA, 2 PEN,) 40 MG/0.8ML PNKT Inject 40 mg into the skin every 14 (fourteen) days.   Yes [provider]  ALPRAZolam  (XANAX ) 0.5 MG tablet Take 1 tablet (0.5 mg total) by mouth 2 (two) times daily as needed for anxiety. 03/15/24  Yes Amin, Ankit C, MD  amLODipine  (NORVASC ) 2.5 MG tablet Take 2.5 mg by mouth daily. Hold for SBP<110 12/10/23  Yes [provider]  benzonatate (TESSALON) 200 MG capsule Take 200 mg by mouth every 8 (eight) hours as needed for cough.   Yes [provider]  chlorhexidine  (PERIDEX ) 0.12 % solution Use as directed 15 mLs in the mouth or throat 2 (two) times daily.   Yes [provider]  Cholecalciferol  5000 units TABS Take 1 tablet by mouth daily.    Yes [provider]  Cranberry 450 MG TABS Take 1 tablet by mouth in the morning and at bedtime. For UTI   Yes [provider]  cycloSPORINE  (RESTASIS ) 0.05 % ophthalmic emulsion Place 1 drop into both eyes 2 (two) times daily. Dry eyes   Yes [provider]  dextromethorphan-guaiFENesin (MUCINEX DM) 30-600 MG 12hr tablet Take 1 tablet by mouth every 12 (twelve) hours as needed for cough.   Yes [provider]  dicyclomine  (BENTYL ) 20 MG tablet Take 20 mg by mouth every 6 (six) hours. Abdominal cramps 01/31/14  Yes [provider]  estradiol  (ESTRACE ) 0.1 MG/GM vaginal cream Place 1 Applicatorful vaginally every other day. Discard plastic applicator. Insert a blueberry size amount (approximately 1 gram)  of cream on fingertip inside vagina at bedtime every other night for long term use. Patient taking differently: Place 1 Applicatorful vaginally every other day. 01/09/24  Yes [name], [name], MD  ferrous sulfate  325 (65 FE) MG EC tablet Take 1 tablet (325 mg total) by mouth daily with  breakfast. 02/04/24  Yes [name], PA-C  fluconazole (DIFLUCAN) IVPB Inject 200 mg into the vein daily. 14 Day course starting on 03/25/2024   Yes [provider]  fluticasone (FLONASE) 50 MCG/ACT nasal spray Place 1 spray into both nostrils in the morning and at bedtime.   Yes [provider]  folic acid  (FOLVITE ) 1 MG tablet Take 1 mg by mouth daily.   Yes [provider]  gabapentin  (NEURONTIN ) 300 MG capsule Take 1 capsule (300 mg total) by mouth 3 (three) times daily as needed (neuropathy pain symptoms). Patient taking differently: Take 300 mg by mouth every 8 (eight) hours as needed (for pain). 01/09/24  Yes [name], [name], MD  guaiFENesin-dextromethorphan (ROBITUSSIN DM) 100-10 MG/5ML syrup Take 10 mLs by mouth at bedtime.   Yes [provider]  lansoprazole (PREVACID) 30 MG capsule Take 60 mg by mouth 2 (two) times daily before a meal. DO NOT CHANGE PER MD   Yes [provider]  leflunomide  (ARAVA ) 10 MG tablet Take 10 mg by mouth daily. 08/01/20  Yes [provider]  LINEZOLID IV Inject 600 mg into the vein every 12 (twelve) hours. 7 day course starting on 03/25/2024   Yes [provider]  Melatonin 10 MG TABS Take 10 mg by mouth at bedtime.   Yes [provider]  methenamine  (HIPREX ) 1 g tablet Take 1 tablet (1 g total) by mouth 2 (two) times daily. Prophylactic 01/22/24  Yes [name], [name], MD  methocarbamol  (ROBAXIN ) 500 MG tablet Take 1 tablet (500 mg total) by mouth every 8 (eight) hours as needed for muscle spasms. 03/15/24  Yes Amin, Ankit C, MD  metoprolol  tartrate (LOPRESSOR ) 25 MG tablet Take 0.5 tablets (12.5 mg total) by mouth 2 (two) times daily. 01/09/24  Yes [name], [name], MD  Multiple Vitamins-Minerals (PRESERVISION AREDS 2) CAPS Take 1 capsule by mouth in the morning and at bedtime.   Yes [provider]  Nutritional Supplement LIQD Take 120 mLs by mouth daily at 6 PM. House  supplement on [facility]   Yes [provider]  ondansetron  (ZOFRAN ) 4 MG tablet Take 4 mg by mouth every 8 (eight) hours as needed for vomiting or nausea. 02/13/24  Yes [provider]  phenazopyridine  (PYRIDIUM ) 200 MG tablet Take 200 mg by mouth every 8 (eight) hours as needed (dysuria). 12/28/23  Yes [provider]  polyethylene glycol (MIRALAX  / GLYCOLAX ) 17 g packet Take 17 g by mouth daily as needed for mild constipation. 03/15/24  Yes Amin, Ankit C, MD  saccharomyces boulardii (FLORASTOR) 250 MG capsule Take 250 mg by mouth 3 (three) times daily.   Yes [provider]  senna (SENOKOT) 8.6 MG tablet Take 1 tablet by mouth 2 (two) times daily.   Yes [provider]  sodium chloride  0.9 % infusion Inject 10 mLs into the vein daily. Use 10 mL intravenously one time a day for flush upper right arm picc line 02/12/24  Yes [provider]  ticagrelor  (BRILINTA ) 60 MG TABS tablet Take 60 mg by mouth daily.   Yes [provider]  traMADol  (ULTRAM ) 50 MG tablet Take 1 tablet (50 mg total) by  mouth every 8 (eight) hours as needed for severe pain (pain score 7-10). 03/15/24  Yes Amin, Ankit C, MD  traZODone  (DESYREL ) 100 MG tablet Take 100 mg by mouth at bedtime. 02/25/23  Yes [provider]  vitamin B-12 (CYANOCOBALAMIN ) 250 MCG tablet Take 250 mcg by mouth daily.   Yes [provider]  torsemide  (DEMADEX ) 20 MG tablet Take 20 mg by mouth daily. Patient not taking: Reported on 03/28/2024 08/31/23   [provider]     Family History  Problem Relation Age of Onset   Breast cancer Mother    Hypertension Father    Transient ischemic attack Father    Lung cancer Father    Hypertension Brother    Leukemia Brother    Leukemia Brother    Leukemia Brother    CVA Maternal Grandfather     Social History   Socioeconomic History   Marital status: Divorced    Spouse name: Not on file   Number of children: 0   Years of  education: 12   Highest education level: 12th grade  Occupational History   Not on file  Tobacco Use   Smoking status: Never    Passive exposure: Never   Smokeless tobacco: Never  Vaping Use   Vaping status: Never Used  Substance and Sexual Activity   Alcohol  use: No   Drug use: No   Sexual activity: Not Currently  Other Topics Concern   Not on file  Social History Narrative   Not on file   Social Drivers of Health   Financial Resource Strain: Low Risk  (01/06/2022)   Overall Financial Resource Strain (CARDIA)    Difficulty of Paying Living Expenses: Not hard at all  Food Insecurity: Patient Declined (03/29/2024)   Hunger Vital Sign    Worried About Running Out of Food in the Last Year: Patient declined    Ran Out of Food in the Last Year: Patient declined  Transportation Needs: No Transportation Needs (03/29/2024)   PRAPARE - Administrator, Civil Service (Medical): No    Lack of Transportation (Non-Medical): No  Physical Activity: Inactive (11/07/2022)   [facility]   Exercise Vital Sign    On average, how many days per week do you engage in moderate to strenuous exercise (like a brisk walk)?: 0 days    On average, how many minutes do you engage in exercise at this level?: 0 min  Stress: No Stress Concern Present (01/06/2022)   [employer] of Occupational Health - Occupational Stress Questionnaire    Feeling of Stress : Not at all  Social Connections: Socially Isolated (03/29/2024)   Social Connection and Isolation Panel    Frequency of Communication with Friends and Family: Never    Frequency of Social Gatherings with Friends and Family: Never    Attends Religious Services: Never    Database administrator or Organizations: No    Attends Banker Meetings: Never    Marital Status: Widowed     Review of Systems: A 12 point ROS discussed and pertinent positives are indicated in the HPI above.  All other systems are  negative.   Vital Signs: BP (!) 152/81 (BP Location: Right Arm)   Pulse (!) 105   Temp 98.1 F (36.7 C) (Oral)   Resp 16   Ht 5' 6 (1.676 m)   Wt 158 lb 11.7 oz (72 kg)   SpO2 95%   BMI 25.62 kg/m  Advance Care Plan: No documents on file    Physical Exam Constitutional:      Appearance: She is obese.  HENT:     Mouth/Throat:     Mouth: Mucous membranes are moist.     Pharynx: Oropharynx is clear.  Cardiovascular:     Rate and Rhythm: Tachycardia present.     Pulses: Normal pulses.  Pulmonary:     Effort: Pulmonary effort is normal.     Breath sounds: Normal breath sounds.  Abdominal:     General: There is no distension.     Palpations: Abdomen is soft.     Tenderness: There is abdominal tenderness.     Comments: Suprapubic tenderness is most bothersome to her but does report generalized mild discomfort with palpation as well  Genitourinary:    Comments: Foley in place with yellow ouput in drainage bag Musculoskeletal:     Right lower leg: Edema present.     Left lower leg: Edema present.     Comments: Nonpitting; edema BUE as well  Skin:    General: Skin is warm and dry.     Comments: LUE PICC  Neurological:     Mental Status: She is alert and oriented to person, place, and time.  Psychiatric:        Mood and Affect: Mood normal.        Behavior: Behavior normal.        Thought Content: Thought content normal.        Judgment: Judgment normal.     Imaging: US  THORACENTESIS ASP PLEURAL SPACE W/IMG GUIDE Result Date: 03/30/2024 INDICATION: 78 year old female. Admitted for abnormal lab value. Found to have a right-sided pleural effusion. Request is for therapeutic and diagnostic right sided thoracentesis EXAM: ULTRASOUND GUIDED THERAPEUTIC AND DIAGNOSTIC RIGHT-SIDED THORACENTESIS MEDICATIONS: Lidocaine  1% 10 mL COMPLICATIONS: None immediate. PROCEDURE: An ultrasound guided thoracentesis was thoroughly discussed with the patient and questions answered. The  benefits, risks, alternatives and complications were also discussed. The patient understands and wishes to proceed with the procedure. Written consent was obtained. Ultrasound was performed to localize and mark an adequate pocket of fluid in the right chest. The area was then prepped and draped in the normal sterile fashion. 1% Lidocaine  was used for local anesthesia. Under ultrasound guidance a 6 Fr Safe-T-Centesis catheter was introduced. Thoracentesis was performed. The catheter was removed and a dressing applied. FINDINGS: A total of approximately 600 mL of pale yellow fluid was removed. Samples were sent to the laboratory as requested by the clinical team. IMPRESSION: Successful ultrasound guided therapeutic and diagnostic right-sided thoracentesis yielding 600 mL of pale yellow pleural fluid. Performed by Delon Beagle NP Electronically Signed   By: Ester Sides M.D.   On: 03/30/2024 11:14   DG Chest 1 View Result Date: 03/30/2024 CLINICAL DATA:  Post thoracentesis today. EXAM: CHEST  1 VIEW COMPARISON:  Chest x-ray 02/18/2024, CT 03/28/2024 FINDINGS: Left-sided PICC line is present with tip over the axillary vein. Patient is rotated to the left. Lungs are hypoinflated. No significant right-sided effusion post thoracentesis. No pneumothorax. Minimal patchy density right base likely atelectasis. Minimal stable tenting of the left hemidiaphragm. Left lung otherwise clear. Cardiomediastinal silhouette and remainder of the exam is unchanged. IMPRESSION: Hypoinflation with minimal patchy density right base likely atelectasis. No significant right-sided effusion post thoracentesis. No pneumothorax. Electronically Signed   By: Toribio Agreste M.D.   On: 03/30/2024 09:34   CT ABDOMEN PELVIS WO CONTRAST Result Date: 03/28/2024 CLINICAL DATA:  Acute abdominal  pain EXAM: CT ABDOMEN AND PELVIS WITHOUT CONTRAST TECHNIQUE: Multidetector CT imaging of the abdomen and pelvis was performed following the standard  protocol without IV contrast. RADIATION DOSE REDUCTION: This exam was performed according to the departmental dose-optimization program which includes automated exposure control, adjustment of the mA and/or kV according to patient size and/or use of iterative reconstruction technique. COMPARISON:  03/10/2024 FINDINGS: Lower chest: There are bilateral pleural effusions, right greater than left, increased since prior study. Compressive atelectasis within the lower lobes. Hepatobiliary: Cholecystectomy. Unremarkable unenhanced appearance of the liver. Pancreas: Unremarkable unenhanced appearance. Spleen: Unremarkable unenhanced appearance. Adrenals/Urinary Tract: The bladder is decompressed with a Foley catheter. Persistent bladder wall thickening and perivesicular fat stranding. Stable bilateral hydroureteronephrosis. Stable mucosal thickening of the renal pelves and ureters concerning for urinary tract infection. No urinary tract calculi. The adrenals are unremarkable. Stomach/Bowel: No bowel obstruction or ileus. Distal colonic diverticulosis without diverticulitis. No bowel wall thickening or inflammatory change. Vascular/Lymphatic: Aortic atherosclerosis. No enlarged abdominal or pelvic lymph nodes. Reproductive: Status post hysterectomy. No adnexal masses. Other: No free fluid or free intraperitoneal gas. No abdominal wall hernia. Musculoskeletal: No acute or destructive bony abnormalities. Reconstructed images demonstrate no additional findings. IMPRESSION: 1. Stable bilateral hydroureteronephrosis. 2. Stable mucosal thickening of the renal pelves and bilateral ureters, as well as diffuse bladder wall thickening and perivesicular fat stranding, concerning for cystitis and urinary tract infection. 3. Bilateral pleural effusions, right greater than left, increased since prior exam. 4.  Aortic Atherosclerosis (ICD10-I70.0). Electronically Signed   By: Ozell Daring M.D.   On: 03/28/2024 18:12   MR BRAIN WO  CONTRAST Result Date: 03/14/2024 EXAM: MRI BRAIN WITHOUT CONTRAST 03/14/2024 07:25:27 PM TECHNIQUE: Multiplanar multisequence MRI of the head/brain was performed without the administration of intravenous contrast. COMPARISON: 01/05/2023 CLINICAL HISTORY: Neuro deficit, acute, stroke suspected; dyarthria. FINDINGS: BRAIN AND VENTRICLES: No acute infarct. No intracranial hemorrhage. No mass. No midline shift. No hydrocephalus. The sella is unremarkable. Normal flow voids. Multifocal hyperintense T2-weighted signal within the cerebral white matter, most commonly due to chronic small vessel disease. Mild volume loss old left thalamic small vessel infarct. ORBITS: No acute abnormality. SINUSES AND MASTOIDS: No acute abnormality. BONES AND SOFT TISSUES: Normal marrow signal. No acute soft tissue abnormality. IMPRESSION: 1. No acute intracranial abnormality. 2. Multifocal hyperintense T2-weighted signal within the cerebral white matter, most commonly due to chronic small vessel disease. 3. 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 4. Mild volume loss. Electronically signed by: Franky Stanford MD 03/14/2024 10:57 PM EDT RP Workstation: [id number]   US  Renal Result Date: 03/10/2024 EXAM: US  Retroperitoneum Complete, Renal. CLINICAL HISTORY: acute kidney failure. TECHNIQUE: Real-time ultrasound of the retroperitoneum (complete) with image documentation. COMPARISON: CT dated 03/10/2024. FINDINGS: RIGHT KIDNEY: Right kidney measures 10.2 x 5.5 x 5.4 cm. Volume is 161 cc. Moderate hydroureteronephrosis, as on CT. No renal stone or mass visualized. LEFT KIDNEY: Left kidney measures 10.6 x 5.9 x 4.6 cm. Volume is 154 cc. Moderate hydroureteronephrosis, as on CT. No renal stone or mass visualized. BLADDER: Mild bladder wall thickening. Prevoid Bladder Volume of 42 cc. IMPRESSION: 1. Moderate bilateral hydroureteronephrosis. 2. Mild bladder wall thickening, likely due to bladder outlet obstruction. Electronically signed by: Rockey Kilts MD 03/10/2024 05:17 PM EDT RP Workstation: [id number]   CT Renal Stone Study Result Date: 03/10/2024 EXAM: CT UROGRAM 03/10/2024 03:58:52 PM TECHNIQUE: CT of the abdomen and pelvis was performed without the administration of intravenous contrast. Multiplanar reformatted images as well as MIP urogram images are provided for  review. Automated exposure control, iterative reconstruction, and/or weight based adjustment of the mA/kV was utilized to reduce the radiation dose to as low as reasonably achievable. COMPARISON: 01/07/2024 CLINICAL HISTORY: acute kidney failure. Pt brb EMS from [facility] for Abnormal Labs. All V/S WDL. No other complaints at this time. FINDINGS: LOWER CHEST: Small bilateral pleural effusions, right greater than left. Minimal dependent lower lobe atelectasis. LIVER: The liver is unremarkable. GALLBLADDER AND BILE DUCTS: Gallbladder is unremarkable. No biliary ductal dilatation. SPLEEN: No acute abnormality. PANCREAS: No acute abnormality. ADRENAL GLANDS: No acute abnormality. KIDNEYS, URETERS AND BLADDER: There is continued bilateral hydroureteronephrosis unchanged since prior exam. Stable bilateral mucosal thickening throughout the renal pelvis and bilateral ureters. Persistent bladder wall thickening with multiple bladder diverticula, consistent with sequela of bladder outlet obstruction. Perivesicular fat stranding could reflect superimposed cystitis. No stones in the kidneys or ureters. No perinephric or periureteral stranding. GI AND BOWEL: Stomach demonstrates no acute abnormality. Sigmoid diverticulosis without evidence of acute diverticulitis. Appendix is surgically absent. There is no bowel obstruction. PERITONEUM AND RETROPERITONEUM: No ascites. No free air. VASCULATURE: Aorta is normal in caliber. Aortic atherosclerosis. LYMPH NODES: No lymphadenopathy. REPRODUCTIVE ORGANS: No acute abnormality. BONES AND SOFT TISSUES: No acute osseous abnormality. No focal soft tissue  abnormality. IMPRESSION: 1. Findings compatible with bladder outlet obstruction, with persistent bladder wall thickening and stable bilateral hydroureteronephrosis. Perivesicular Fat stranding and uroepithelial mural thickening consistent with cystitis and urinary tract infection. Please correlate with urinalysis. 2. No urinary tract calculi. 3. Sigmoid diverticulosis without diverticulitis. Electronically signed by: Ozell Daring MD 03/10/2024 04:05 PM EDT RP Workstation: [id number]    Labs:  CBC: Recent Labs    03/29/24 0529 03/30/24 0438 03/31/24 0404 04/01/24 0412  WBC 10.0 8.7 8.0 8.8  HGB 9.1* 8.3* 7.6* 7.7*  HCT 29.6* 26.6* 24.7* 24.6*  PLT 494* 439* 352 369    COAGS: Recent Labs    04/01/24 0412  INR 1.8*    BMP: Recent Labs    03/29/24 0529 03/30/24 0438 03/31/24 0404 04/01/24 0412  NA 138 139 136 136  K 4.1 3.8 3.7 4.0  CL 110 111 109 108  CO2 15* 16* 16* 18*  GLUCOSE 98 101* 112* 106*  BUN 35* 34* 33* 30*  CALCIUM  8.0* 7.9* 7.7* 7.6*  CREATININE 3.68* 3.80* 3.39* 2.68*  GFRNONAA 12* 12* 13* 18*    LIVER FUNCTION TESTS: Recent Labs    02/01/24 1455 03/10/24 1429 03/13/24 0440 03/15/24 0523 03/28/24 1617 03/29/24 0529 04/01/24 0412  BILITOT 0.6 0.4  --   --  <0.2 <0.2  --   AST 13* 14*  --   --  <10* <10*  --   ALT 8 <5  --   --  <5 <5  --   ALKPHOS 110 210*  --   --  143* 137*  --   PROT 6.9 7.4  --   --  6.0* 5.9*  --   ALBUMIN 2.9* 3.0*   < > 2.5* 2.7* 2.6* 2.4*   < > = values in this interval not displayed.    TUMOR MARKERS: No results for input(s): AFPTM, CEA, CA199, CHROMGRNA in the last 8760 hours.  Assessment and Plan:  Request for  image guided bilateral PCN placement approved for 10/24. No contraindications for procedure identified in ROS, physical exam, or review of pre-sedation considerations. Labs reviewed and within acceptable range. Dr. Jenna aware of INR 1.8, will proceed given urgent nature, benefit outweighs  risk.  10/20 imaging available and  reviewed VSS, afebrile Patient does not take a blood thinner  Abx - ceftriaxone     Risks and benefits of bilateral PCN placement was discussed with the patient including, but not limited to, infection, bleeding, significant bleeding causing loss or decrease in renal function or damage to adjacent structures.   All of the patient's questions were answered, patient is agreeable to proceed.  Consent signed and in chart.   Thank you for allowing our service to participate in ALKA FALWELL 's care.    Electronically Signed: Laymon Coast, NP   04/01/2024, 9:20 AM     I spent a total of 20 Minutes    in face to face in clinical consultation, greater than 50% of which was counseling/coordinating care for image guided bilateral PCN placement.   (A copy of this note was sent to the referring provider and the time of visit.)

## 2024-04-01 NOTE — Significant Event (Signed)
 CROSS COVER NOTE  NAME: ALYNE MARTINSON MRN: [id number] DOB : 12-23-1945 ATTENDING PHYSICIAN: Maree Bracken D, DO    Date of Service   04/01/2024   HPI/Events of Note   [facility] Cross Cover at [facility] received from nurse patient red mews dut to HR 155 and temp 102.7; active vomiting. (Threw up her xanax  and metoprolol .  HPI:  78 year old female who had  bilateral percutaneous nephrostomy tubes placed in IR at [facility] today for treatment of outlet obstruction causing bilateral hydroureter nephrosis. . Chronic conditions reviewed.  Preliminary culture  report from turbid pussy fluid obtained from right kidney showed rare budding yeast and pmn; left kidney with abundant white cells moderate yeast Urinalysis on 10/20 with rare bacteria, no yeast, no nitrate, mod white cells on micro. MDRO klebsiella UTI 12/2023 - completed course of meropenem ; urine culture 10/2 without growth  Interventions   Assessment/Plan:    04/01/2024   10:00 PM 04/01/2024    2:34 PM 04/01/2024   12:03 PM  Vitals with BMI  Systolic 153 142 866  Diastolic 83 81 78  Pulse 159 104 104   TEMP                      102.7 Resp rate                   39 General - pale, mildly distressed with nausea and left flank pain (pt states not new and unchanged) SVT on monitor 155. DP good, sats stable, on room air, no resp distress Right nephrostomy tube appears patent with turbid maroon yellow +drainage, dressing in tact Left nephrostomy tube with yellow urine in bag, appears to have had mucous plug in tube but dislodged during assessment, dressing is dry and in tact EKG  reviewed by me SVT 156 with nonspecific ST Twave abnormality Qtc 412 Rectal temp taken with cooling blanket after transfer 104.70F  CBC    Component Value Date/Time   WBC 18.3 (H) 04/02/2024 0030   RBC 2.82 (L) 04/02/2024 0030   HGB 8.7 (L) 04/02/2024 0030   HCT 27.4 (L) 04/02/2024 0030   PLT 400 04/02/2024 0030   MCV 97.2 04/02/2024 0030    MCH 30.9 04/02/2024 0030   MCHC 31.8 04/02/2024 0030   RDW 15.9 (H) 04/02/2024 0030   LYMPHSABS 1.3 04/02/2024 0030   MONOABS 0.6 04/02/2024 0030   EOSABS 0.3 04/02/2024 0030   BASOSABS 0.2 (H) 04/02/2024 0030      Latest Ref Rng & Units 04/02/2024   12:30 AM 04/01/2024    4:12 AM 03/31/2024    4:04 AM  CMP  Glucose 70 - 99 mg/dL 863  893  887   BUN 8 - 23 mg/dL 28  30  33   Creatinine 0.44 - 1.00 mg/dL 7.71  7.31  6.60   Sodium 135 - 145 mmol/L 137  136  136   Potassium 3.5 - 5.1 mmol/L 3.8  4.0  3.7   Chloride 98 - 111 mmol/L 106  108  109   CO2 22 - 32 mmol/L 17  18  16    Calcium  8.9 - 10.3 mg/dL 7.9  7.6  7.7   Total Protein 6.5 - 8.1 g/dL 6.0     Total Bilirubin 0.0 - 1.2 mg/dL 0.3     Alkaline Phos 38 - 126 U/L 113     AST 15 - 41 U/L 13  ALT 0 - 44 U/L <5           Latest Reference Range & Units 04/02/24 00:30  pH, Ven 7.25 - 7.43  7.38  pCO2, Ven 44 - 60 mmHg 28 (L)  pO2, Ven 32 - 45 mmHg 102 (H)  Acid-base deficit 0.0 - 2.0 mmol/L 7.1 (H)  Bicarbonate 20.0 - 28.0 mmol/L 15.8 (L)  O2 Saturation % 99.9  Patient temperature  39.2  Collection site  RIGHT ANTECUBITAL  (L): Data is abnormally low (H): Data is abnormally high  Sepsis with urine/kidney as source Blood and fungal culture CBC w/ diff- leukocytosis with neutrophil dominance Lactic acid q3h x 2. Will hold fluids as no sign of hypoperfusion and pleural effusions present Meropenem  per pharmacy dosing Diflucan 400 mg IV Q24h (adjusted by pharm to 200 mg) Monitor fever Cooling blanket prn fever - T max overnight 104.1 [facility] Chest xray - pleural effusions no longer present in [facility] view.  Resp viral panel - pending  Metabolic acidosis - chronic compensated Bicarb deficit 650 mg sodium bicarb po TID Renal function continues to improve  Anemia Hgb stable type and screen was ordered q 72 h for 2 occurrences   CRITICAL CARE Performed by: Erminio Cone   Total critical  care time: 45 minutes  Critical care time was exclusive of separately billable procedures and treating other patients.  Critical care was necessary to treat or prevent imminent or life-threatening deterioration.  Critical care was time spent personally by me on the following activities: development of treatment plan with patient and/or surrogate as well as nursing, discussions with consultants, evaluation of patient's response to treatment, examination of patient, obtaining history from patient or surrogate, ordering and performing treatments and interventions, ordering and review of laboratory studies, ordering and review of radiographic studies, pulse oximetry and re-evaluation of patient's condition.        Erminio LITTIE Cone NP [facility] Cross Cover 7pm-7am - check amion for availability Pager [phone]

## 2024-04-02 ENCOUNTER — Inpatient Hospital Stay (HOSPITAL_COMMUNITY)

## 2024-04-02 DIAGNOSIS — R509 Fever, unspecified: Secondary | ICD-10-CM | POA: Diagnosis not present

## 2024-04-02 DIAGNOSIS — N185 Chronic kidney disease, stage 5: Secondary | ICD-10-CM | POA: Diagnosis not present

## 2024-04-02 DIAGNOSIS — N179 Acute kidney failure, unspecified: Secondary | ICD-10-CM | POA: Diagnosis not present

## 2024-04-02 DIAGNOSIS — I517 Cardiomegaly: Secondary | ICD-10-CM | POA: Diagnosis not present

## 2024-04-02 DIAGNOSIS — A419 Sepsis, unspecified organism: Secondary | ICD-10-CM | POA: Diagnosis not present

## 2024-04-02 DIAGNOSIS — R0682 Tachypnea, not elsewhere classified: Secondary | ICD-10-CM | POA: Diagnosis not present

## 2024-04-02 LAB — CBC WITH DIFFERENTIAL/PLATELET
Abs Immature Granulocytes: 0.16 K/uL — ABNORMAL HIGH (ref 0.00–0.07)
Basophils Absolute: 0.2 K/uL — ABNORMAL HIGH (ref 0.0–0.1)
Basophils Relative: 1 %
Eosinophils Absolute: 0.3 K/uL (ref 0.0–0.5)
Eosinophils Relative: 1 %
HCT: 27.4 % — ABNORMAL LOW (ref 36.0–46.0)
Hemoglobin: 8.7 g/dL — ABNORMAL LOW (ref 12.0–15.0)
Immature Granulocytes: 1 %
Lymphocytes Relative: 7 %
Lymphs Abs: 1.3 K/uL (ref 0.7–4.0)
MCH: 30.9 pg (ref 26.0–34.0)
MCHC: 31.8 g/dL (ref 30.0–36.0)
MCV: 97.2 fL (ref 80.0–100.0)
Monocytes Absolute: 0.6 K/uL (ref 0.1–1.0)
Monocytes Relative: 4 %
Neutro Abs: 15.8 K/uL — ABNORMAL HIGH (ref 1.7–7.7)
Neutrophils Relative %: 86 %
Platelets: 400 K/uL (ref 150–400)
RBC: 2.82 MIL/uL — ABNORMAL LOW (ref 3.87–5.11)
RDW: 15.9 % — ABNORMAL HIGH (ref 11.5–15.5)
WBC: 18.3 K/uL — ABNORMAL HIGH (ref 4.0–10.5)
nRBC: 0 % (ref 0.0–0.2)

## 2024-04-02 LAB — CBC
HCT: 25.1 % — ABNORMAL LOW (ref 36.0–46.0)
Hemoglobin: 7.7 g/dL — ABNORMAL LOW (ref 12.0–15.0)
MCH: 30.2 pg (ref 26.0–34.0)
MCHC: 30.7 g/dL (ref 30.0–36.0)
MCV: 98.4 fL (ref 80.0–100.0)
Platelets: 318 K/uL (ref 150–400)
RBC: 2.55 MIL/uL — ABNORMAL LOW (ref 3.87–5.11)
RDW: 16 % — ABNORMAL HIGH (ref 11.5–15.5)
WBC: 12.8 K/uL — ABNORMAL HIGH (ref 4.0–10.5)
nRBC: 0 % (ref 0.0–0.2)

## 2024-04-02 LAB — LACTIC ACID, PLASMA
Lactic Acid, Venous: 0.8 mmol/L (ref 0.5–1.9)
Lactic Acid, Venous: 0.6 mmol/L (ref 0.5–1.9)

## 2024-04-02 LAB — RENAL FUNCTION PANEL
Albumin: 2.5 g/dL — ABNORMAL LOW (ref 3.5–5.0)
Anion gap: 12 (ref 5–15)
BUN: 28 mg/dL — ABNORMAL HIGH (ref 8–23)
CO2: 19 mmol/L — ABNORMAL LOW (ref 22–32)
Calcium: 7.9 mg/dL — ABNORMAL LOW (ref 8.9–10.3)
Chloride: 107 mmol/L (ref 98–111)
Creatinine, Ser: 2.25 mg/dL — ABNORMAL HIGH (ref 0.44–1.00)
GFR, Estimated: 22 mL/min — ABNORMAL LOW (ref >60)
Glucose, Bld: 133 mg/dL — ABNORMAL HIGH (ref 70–99)
Phosphorus: 3.8 mg/dL (ref 2.5–4.6)
Potassium: 3.8 mmol/L (ref 3.5–5.1)
Sodium: 137 mmol/L (ref 135–145)

## 2024-04-02 LAB — BLOOD GAS, VENOUS
Acid-base deficit: 7.1 mmol/L — ABNORMAL HIGH (ref 0.0–2.0)
Bicarbonate: 15.8 mmol/L — ABNORMAL LOW (ref 20.0–28.0)
Drawn by: 1517
O2 Saturation: 99.9 %
Patient temperature: 39.2
pCO2, Ven: 28 mmHg — ABNORMAL LOW (ref 44–60)
pH, Ven: 7.38 (ref 7.25–7.43)
pO2, Ven: 102 mmHg — ABNORMAL HIGH (ref 32–45)

## 2024-04-02 LAB — MRSA NEXT GEN BY PCR, NASAL: MRSA by PCR Next Gen: DETECTED — AB

## 2024-04-02 LAB — COMPREHENSIVE METABOLIC PANEL WITH GFR
ALT: <5 U/L (ref 0–44)
AST: 13 U/L — ABNORMAL LOW (ref 15–41)
Albumin: 2.7 g/dL — ABNORMAL LOW (ref 3.5–5.0)
Alkaline Phosphatase: 113 U/L (ref 38–126)
Anion gap: 14 (ref 5–15)
BUN: 28 mg/dL — ABNORMAL HIGH (ref 8–23)
CO2: 17 mmol/L — ABNORMAL LOW (ref 22–32)
Calcium: 7.9 mg/dL — ABNORMAL LOW (ref 8.9–10.3)
Chloride: 106 mmol/L (ref 98–111)
Creatinine, Ser: 2.28 mg/dL — ABNORMAL HIGH (ref 0.44–1.00)
GFR, Estimated: 21 mL/min — ABNORMAL LOW (ref >60)
Glucose, Bld: 136 mg/dL — ABNORMAL HIGH (ref 70–99)
Potassium: 3.8 mmol/L (ref 3.5–5.1)
Sodium: 137 mmol/L (ref 135–145)
Total Bilirubin: 0.3 mg/dL (ref 0.0–1.2)
Total Protein: 6 g/dL — ABNORMAL LOW (ref 6.5–8.1)

## 2024-04-02 LAB — FUNGUS CULTURE, BLOOD: Special Requests: ADEQUATE

## 2024-04-02 LAB — MAGNESIUM
Magnesium: 1.8 mg/dL (ref 1.7–2.4)
Magnesium: 1.9 mg/dL (ref 1.7–2.4)

## 2024-04-02 LAB — GLUCOSE, CAPILLARY: Glucose-Capillary: 145 mg/dL — ABNORMAL HIGH (ref 70–99)

## 2024-04-02 MED ORDER — ACETAMINOPHEN 325 MG PO TABS
650.0000 mg | ORAL_TABLET | Freq: Four times a day (QID) | ORAL | Status: DC | PRN
  Administered 2024-04-02 – 2024-04-06 (×4): 650 mg via ORAL
  Filled 2024-04-02 (×4): qty 2

## 2024-04-02 MED ORDER — ORAL CARE MOUTH RINSE: 15.0000 mL | OROMUCOSAL | Status: DC | PRN

## 2024-04-02 MED ORDER — SODIUM CHLORIDE 0.9 % IV SOLN: INTRAVENOUS | Status: AC | PRN

## 2024-04-02 MED ORDER — TRAMADOL HCL 50 MG PO TABS
50.0000 mg | ORAL_TABLET | Freq: Two times a day (BID) | ORAL | Status: DC | PRN
Start: 2024-04-02 — End: 2024-04-09
  Administered 2024-04-02 – 2024-04-08 (×8): 50 mg via ORAL
  Filled 2024-04-02 (×9): qty 1

## 2024-04-02 MED ORDER — SODIUM BICARBONATE 650 MG PO TABS
650.0000 mg | ORAL_TABLET | Freq: Three times a day (TID) | ORAL | Status: DC
  Administered 2024-04-02 – 2024-04-09 (×21): 650 mg via ORAL
  Filled 2024-04-02 (×21): qty 1

## 2024-04-02 MED ORDER — MUPIROCIN 2 % EX OINT
TOPICAL_OINTMENT | Freq: Two times a day (BID) | CUTANEOUS | Status: DC
  Administered 2024-04-04: 1 via NASAL
  Filled 2024-04-02 (×3): qty 22

## 2024-04-02 MED ORDER — SODIUM CHLORIDE 0.9% FLUSH
5.0000 mL | Freq: Three times a day (TID) | INTRAVENOUS | Status: DC
  Administered 2024-04-02 – 2024-04-09 (×20): 5 mL

## 2024-04-02 NOTE — Plan of Care (Signed)
 This patient remains on [facility] as of time of writing; patient is telemetry level of care and is pending transfer. The alert, oriented to person, place, and situation; disoriented to month but easily reoriented. No supplemental O2 requirement at this time. Nephrostomy tubes x 2, one to either flank, without obvious complication. The IV to the LUE, previously documented as a PICC line, is actually a power midline; undocumented insertion date/time/place; Flowsheets updated to reflect this fact. Midline dressing, cap, and IV tubing are all changed overnight by this RN. Brisk blood return present with the midline. Patient's POA remains at bedside overnight.   Problem: Education: Goal: Knowledge of General Education information will improve Description: Including pain rating scale, medication(s)/side effects and non-pharmacologic comfort measures Outcome: Progressing   Problem: Health Behavior/Discharge Planning: Goal: Ability to manage health-related needs will improve Outcome: Progressing   Problem: Clinical Measurements: Goal: Ability to maintain clinical measurements within normal limits will improve Outcome: Progressing Goal: Will remain free from infection Outcome: Progressing Goal: Diagnostic test results will improve Outcome: Progressing Goal: Respiratory complications will improve Outcome: Progressing Goal: Cardiovascular complication will be avoided Outcome: Progressing   Problem: Activity: Goal: Risk for activity intolerance will decrease Outcome: Progressing   Problem: Nutrition: Goal: Adequate nutrition will be maintained Outcome: Progressing   Problem: Coping: Goal: Level of anxiety will decrease Outcome: Progressing   Problem: Elimination: Goal: Will not experience complications related to bowel motility Outcome: Progressing Goal: Will not experience complications related to urinary retention Outcome: Progressing   Problem: Pain Managment: Goal: General  experience of comfort will improve and/or be controlled Outcome: Progressing   Problem: Safety: Goal: Ability to remain free from injury will improve Outcome: Progressing   Problem: Skin Integrity: Goal: Risk for impaired skin integrity will decrease Outcome: Progressing   Problem: Education: Goal: Knowledge of disease and its progression will improve Outcome: Progressing Goal: Individualized Educational Video(s) Outcome: Progressing   Problem: Fluid Volume: Goal: Compliance with measures to maintain balanced fluid volume will improve Outcome: Progressing   Problem: Health Behavior/Discharge Planning: Goal: Ability to manage health-related needs will improve Outcome: Progressing   Problem: Nutritional: Goal: Ability to make healthy dietary choices will improve Outcome: Progressing   Problem: Clinical Measurements: Goal: Complications related to the disease process, condition or treatment will be avoided or minimized Outcome: Progressing

## 2024-04-02 NOTE — Progress Notes (Signed)
 PROGRESS NOTE    Robin Blackburn  FMW:[id number] DOB: [birth year] DOA: 03/28/2024 PCP: Bertell Satterfield, MD   Brief Narrative:    Robin Blackburn is a 78 y.o. female with medical history significant of hypertension, rheumatoid arthritis, fibromyalgia, chronic pain, prior stroke, bladder outlet obstruction who presents to the emergency department via EMS due to abnormal lab.  Patient states that she was told that her kidney function was worsening, so she was sent to the ED for further evaluation and management.  She complained about nausea generalized weakness and abdominal pain but denies any discomfort with her urinary catheter.  Also denies fever, chills, cough, chest pain, shortness of breath, nausea, vomiting.  CT abdomen and pelvis without contrast showed stable bilateral hydroureteronephrosis showed concern for cystitis and urinary tract infection.  Bilateral pleural effusions, right greater than left, increased since prior exam. Foley catheter was changed, patient was started on IV meropenem , Zofran  was given due to nausea and IV hydration was provided.  Patient is admitted to the hospitalist service for further management evaluation of AKI, metabolic acidosis, UTI, pleural effusions.  Patient was noted to have some tachycardia as well as fever overnight and was shifted to the ICU for closer evaluation and was started on IV Merrem  as well as Diflucan given some of the findings in her urine cultures from bilateral nephrostomy tube placement that took place on 10/24.  Assessment & Plan:   Principal Problem:   Acute kidney injury superimposed on stage 5 chronic kidney disease, not on chronic dialysis [facility]) Active Problems:   Urinary tract infection   Essential hypertension   Rheumatoid arthritis (HCC)   GERD (gastroesophageal reflux disease)   Metabolic acidosis   Bilateral pleural effusion   Obstructive uropathy   Macrocytic anemia   Hypoalbuminemia due to protein-calorie  malnutrition  Assessment and Plan:   Acute kidney injury superimposed on stage III chronic kidney disease Obstructive uropathy status post bilateral percutaneous nephrostomy tube placement 10/24 Creatinine downward trending (baseline creatinine at 1.3-1.8) continue to monitor in a.m. IV hydration was provided in the ED. Will stop fluids, she eating fair. Renally adjust medications, avoid nephrotoxic agents/dehydration/hypotension Patient is agreeable to HD if needed, but appears to be a poor candidate per nephrology Foley catheter discontinued 10/24   Metabolic acidosis possibly secondary to above-improving Low bicarb in the setting of kidney dysfunction. Low calcium , high phos noted. Appreciate ongoing nephrology evaluation   Urinalysis suspicious for UTI with SIRS and possible sepsis CT abdomen and pelvis without contrast was suggestive of UTI Urine culture done on 01/04/2024 was positive for Klebsiella pneumoniae (ESBL) which was only sensitive to imipenem and gentamicin. Meropenem  restarted as well as Diflucan on 10/24   Bilateral pleural effusion CT abdomen and pelvis without contrast showed bilateral pleural effusions, right greater than left, increased since prior exam. Right-sided thoracentesis done with removed. Follow fluid analysis.   Macrocytic anemia MCV 100.7, hemoglobin 8.9, hematocrit 29.4 Folate and vitamin B12 levels normal. Continue to monitor and transfuse as needed for hemoglobin less than 7   Hypoalbuminemia possibly secondary to moderate protein calorie malnutrition Albumin 2.7, protein supplement will be provided   Essential Hypertension Continue metoprolol  12.5 twice daily, increased Norvasc  to 5mg .   Rheumatoid arthritis Stable, Leflunomide  and adalimumab will be held in the setting of acute infection   GERD Continue Protonix    DVT prophylaxis: SCDs Code Status: Full Family Communication: HCPOA/friend at bedside 10/25 Disposition Plan:   Status is: Inpatient Remains inpatient appropriate because: Need for  IV medications  Consultants:  Nephrology Discussed with Urology Dr. Sherrilee IR Palliative  Procedures:  None  Antimicrobials:  Anti-infectives (From admission, onward)    Start     Dose/Rate Route Frequency Ordered Stop   04/02/24 0015  meropenem  (MERREM ) 500 mg in sodium chloride  0.9 % 100 mL IVPB        500 mg 200 mL/hr over 30 Minutes Intravenous Every 12 hours 04/01/24 2325     04/01/24 2359  fluconazole (DIFLUCAN) IVPB 200 mg        200 mg 100 mL/hr over 60 Minutes Intravenous Every 24 hours 04/01/24 2259     04/01/24 2345  fluconazole (DIFLUCAN) IVPB 400 mg  Status:  Discontinued        400 mg 100 mL/hr over 120 Minutes Intravenous Every 24 hours 04/01/24 2252 04/01/24 2259   04/01/24 1000  cefTRIAXone  (ROCEPHIN ) 2 g in sodium chloride  0.9 % 100 mL IVPB        2 g 200 mL/hr over 30 Minutes Intravenous To Radiology 03/31/24 1554 04/01/24 1025   03/28/24 2200  meropenem  (MERREM ) 1 g in sodium chloride  0.9 % 100 mL IVPB  Status:  Discontinued        1 g 200 mL/hr over 30 Minutes Intravenous Every 8 hours 03/28/24 2036 03/28/24 2036   03/28/24 2200  meropenem  (MERREM ) 1 g in sodium chloride  0.9 % 100 mL IVPB  Status:  Discontinued        1 g 200 mL/hr over 30 Minutes Intravenous Every 12 hours 03/28/24 2036 03/31/24 1243       Subjective: Patient noted to have episode of fever overnight as well as tachycardia and therefore was moved to stepdown unit for closer monitoring and started on IV Merrem  and Diflucan given recent urine culture testing.  She is currently doing well with no further tachycardia or fever noted.  Objective: Vitals:   04/02/24 0724 04/02/24 0800 04/02/24 0900 04/02/24 1000  BP:  (!) 112/52 125/83 127/60  Pulse: 89 89 99 (!) 102  Resp: 16 19 (!) 23 18  Temp: (!) 97.4 F (36.3 C)     TempSrc: Axillary     SpO2: 96% 95%    Weight:      Height:        Intake/Output Summary  (Last 24 hours) at 04/02/2024 1201 Last data filed at 04/02/2024 9077 Gross per 24 hour  Intake 1180 ml  Output 1955 ml  Net -775 ml   Filed Weights   03/28/24 1440 04/01/24 2343  Weight: 72 kg 82.7 kg    Examination:  General exam: Appears calm and comfortable  Respiratory system: Clear to auscultation. Respiratory effort normal. Cardiovascular system: S1 & S2 heard, RRR.  Gastrointestinal system: Abdomen is soft Central nervous system: Alert and awake Extremities: No edema Skin: No significant lesions noted Psychiatry: Flat affect. Foley with clear, yellow urine output noted Nephrostomy tubes with clear, yellow urine output noted    Data Reviewed: I have personally reviewed following labs and imaging studies  CBC: Recent Labs  Lab 03/28/24 1617 03/29/24 0529 03/30/24 0438 03/31/24 0404 04/01/24 0412 04/02/24 0030 04/02/24 0439  WBC 9.2   < > 8.7 8.0 8.8 18.3* 12.8*  NEUTROABS 6.9  --  6.2  --   --  15.8*  --   HGB 8.9*   < > 8.3* 7.6* 7.7* 8.7* 7.7*  HCT 29.4*   < > 26.6* 24.7* 24.6* 27.4* 25.1*  MCV 100.7*   < > 99.3  98.4 98.4 97.2 98.4  PLT 465*   < > 439* 352 369 400 318   < > = values in this interval not displayed.   Basic Metabolic Panel: Recent Labs  Lab 03/29/24 0529 03/30/24 0438 03/31/24 0404 04/01/24 0412 04/02/24 0030 04/02/24 0439  NA 138 139 136 136 137 137  K 4.1 3.8 3.7 4.0 3.8 3.8  CL 110 111 109 108 106 107  CO2 15* 16* 16* 18* 17* 19*  GLUCOSE 98 101* 112* 106* 136* 133*  BUN 35* 34* 33* 30* 28* 28*  CREATININE 3.68* 3.80* 3.39* 2.68* 2.28* 2.25*  CALCIUM  8.0* 7.9* 7.7* 7.6* 7.9* 7.9*  MG 2.0  --   --  1.8 1.8 1.9  PHOS 4.8*  --   --  2.8  --  3.8   GFR: Estimated Creatinine Clearance: 22.3 mL/min (A) (by C-G formula based on SCr of 2.25 mg/dL (H)). Liver Function Tests: Recent Labs  Lab 03/28/24 1617 03/29/24 0529 04/01/24 0412 04/02/24 0030 04/02/24 0439  AST <10* <10*  --  13*  --   ALT <5 <5  --  <5  --   ALKPHOS  143* 137*  --  113  --   BILITOT <0.2 <0.2  --  0.3  --   PROT 6.0* 5.9*  --  6.0*  --   ALBUMIN 2.7* 2.6* 2.4* 2.7* 2.5*   Recent Labs  Lab 03/28/24 1617  LIPASE 19   No results for input(s): AMMONIA in the last 168 hours. Coagulation Profile: Recent Labs  Lab 04/01/24 0412  INR 1.8*   Cardiac Enzymes: No results for input(s): CKTOTAL, CKMB, CKMBINDEX, TROPONINI in the last 168 hours. BNP (last 3 results) No results for input(s): PROBNP in the last 8760 hours. HbA1C: No results for input(s): HGBA1C in the last 72 hours. CBG: Recent Labs  Lab 04/02/24 0426  GLUCAP 145*   Lipid Profile: No results for input(s): CHOL, HDL, LDLCALC, TRIG, CHOLHDL, LDLDIRECT in the last 72 hours. Thyroid  Function Tests: No results for input(s): TSH, T4TOTAL, FREET4, T3FREE, THYROIDAB in the last 72 hours. Anemia Panel: No results for input(s): VITAMINB12, FOLATE, FERRITIN, TIBC, IRON, RETICCTPCT in the last 72 hours.  Sepsis Labs: Recent Labs  Lab 04/02/24 0030 04/02/24 0439  LATICACIDVEN 0.8 0.6    Recent Results (from the past 240 hours)  Urine Culture     Status: None   Collection Time: 03/28/24  7:20 PM   Specimen: Urine, Random  Result Value Ref Range Status   Specimen Description   Final    URINE, RANDOM Performed at [facility], [address], [place], [place] [address]    Special Requests   Final    NONE Reflexed from [id number] Performed at [facility], [address], [place], [place] [address]    Culture   Final    NO GROWTH Performed at [facility], [address] [place], [place] [address]    Report Status 03/30/2024 FINAL  Final  Culture, body fluid w Gram Stain-bottle     Status: None (Preliminary result)   Collection Time: 03/30/24  9:05 AM   Specimen: Pleura  Result Value Ref Range Status   Specimen Description PLEURAL RIGHT  Final   Special Requests   Final    BOTTLES DRAWN AEROBIC AND ANAEROBIC  Blood Culture adequate volume   Culture   Final    NO GROWTH 3 DAYS Performed at [facility], [address], [place], [place] [address]  Report Status PENDING  Incomplete  Gram stain     Status: None   Collection Time: 03/30/24  9:05 AM   Specimen: Pleura  Result Value Ref Range Status   Specimen Description PLEURAL  Final   Special Requests NONE  Final   Gram Stain   Final    CYTOSPIN SMEAR NO ORGANISMS SEEN WBC PRESENT,BOTH PMN AND MONONUCLEAR Performed at [facility], [address] [place], [place] [address]    Report Status 03/30/2024 FINAL  Final  Aerobic/Anaerobic Culture w Gram Stain (surgical/deep wound)     Status: None (Preliminary result)   Collection Time: 04/01/24 10:40 AM   Specimen: Kidney  Result Value Ref Range Status   Specimen Description KIDNEY  Final   Special Requests LEFT  Final   Gram Stain   Final    ABUNDANT WBC PRESENT, PREDOMINANTLY PMN MODERATE YEAST    Culture   Final    NO GROWTH < 24 HOURS Performed at [facility], [address], [place], [place] [address]    Report Status PENDING  Incomplete  Body fluid culture w Gram Stain     Status: None (Preliminary result)   Collection Time: 04/01/24 11:06 AM   Specimen: Kidney; Body Fluid  Result Value Ref Range Status   Specimen Description KIDNEY  Final   Special Requests RIGHT RENAL  Final   Gram Stain   Final    FEW WBC PRESENT, PREDOMINANTLY PMN RARE BUDDING YEAST SEEN    Culture   Final    CULTURE REINCUBATED FOR BETTER GROWTH Performed at [facility], [address], [place], [place] [address]    Report Status PENDING  Incomplete  Culture, blood (Routine X 2) w Reflex to ID Panel     Status: None (Preliminary result)   Collection Time: 04/02/24 12:30 AM   Specimen: Right Antecubital; Blood  Result Value Ref Range Status   Specimen Description RIGHT ANTECUBITAL  Final   Special Requests   Final    BOTTLES DRAWN AEROBIC AND ANAEROBIC Blood Culture adequate volume    Culture   Final    NO GROWTH < 12 HOURS Performed at [facility], [address], [place], [place] [address]    Report Status PENDING  Incomplete  Culture, blood (Routine X 2) w Reflex to ID Panel     Status: None (Preliminary result)   Collection Time: 04/02/24 12:40 AM   Specimen: BLOOD RIGHT HAND  Result Value Ref Range Status   Specimen Description BLOOD RIGHT HAND  Final   Special Requests   Final    BOTTLES DRAWN AEROBIC AND ANAEROBIC Blood Culture adequate volume   Culture   Final    NO GROWTH < 12 HOURS Performed at [facility], [address] [place], [place] [address]    Report Status PENDING  Incomplete  Fungus culture, blood     Status: None (Preliminary result)   Collection Time: 04/02/24 11:39 AM   Specimen: BLOOD  Result Value Ref Range Status   Specimen Description BLOOD PICC LINE  Final   Special Requests   Final    BOTTLES DRAWN AEROBIC AND ANAEROBIC Blood Culture adequate volume Performed at [facility], [address] [place], [place] [address]    Culture PENDING  Incomplete   Report Status PENDING  Incomplete         Radiology Studies: DG CHEST PORT 1 VIEW Result Date: 04/02/2024 EXAM: 1 VIEW(S) XRAY OF THE CHEST 04/02/2024 01:12:00 AM COMPARISON: Chest  x-ray 03/30/2024. CLINICAL HISTORY: Sepsis (HCC), fever, tachypnea. FINDINGS: LUNGS AND PLEURA: No focal pulmonary opacity. No pulmonary edema. No pleural effusion. No pneumothorax. HEART AND MEDIASTINUM: Heart is enlarged, unchanged. BONES AND SOFT TISSUES: No acute osseous abnormality. IMPRESSION: 1. No acute findings. 2. Enlarged heart, unchanged. Electronically signed by: Greig Pique MD 04/02/2024 01:14 AM EDT RP Workstation: [id number]   IR NEPHROSTOMY PLACEMENT BILATERAL Result Date: 04/01/2024 INDICATION: Obstructive uropathy and hydronephrosis. EXAM: Ultrasound and fluoroscopic guided percutaneous nephrostomy tube placement COMPARISON:  None Available. MEDICATIONS: 2 g IV Rocephin ; The  antibiotic was administered in an appropriate time frame prior to skin puncture. CONTRAST:  3 mL Gadavist-administered into the collecting system(s) FLUOROSCOPY: Radiation Exposure Index (as provided by the fluoroscopic device): 13 mGy Kerma COMPLICATIONS: None immediate. PROCEDURE: Informed written consent was obtained from the patient after a thorough discussion of the procedural risks, benefits and alternatives. All questions were addressed. Maximal Sterile Barrier Technique was utilized including caps, mask, sterile gowns, sterile gloves, sterile drape, hand hygiene and skin antiseptic. A timeout was performed prior to the initiation of the procedure. Bilateral flank regions were prepped and draped in usual sterile fashion. Local anesthesia was achieved by infiltrating subcutaneous tissue of the flank regions to the renal cortex with 1% lidocaine . A small incision was made in the left flank region and an Accustick needle was advanced under ultrasound guidance. The patient had hydronephrosis in the needle tip was directed to a dilated renal calyx. Once visualized within the calyx, the stylet was removed leaving the needle behind. Pus was obtained. A small volume of gadolinium was then injected under fluoroscopic guidance filling the calices and dilated renal pelvis. An 018 guidewire was then advanced into the proximal ureter. The Accustick sheath was then advanced over the guidewire into the proximal ureter. An 035 guidewire was then advanced through the sheath into the proximal ureter. The access site was dilated. A 10 French pigtail catheter was then advanced over the guidewire and coiled within the renal pelvis. A small sample of fluid was then collected and sent to pathology for culture and sensitivity. Accustick needle was then advanced through a small incision in the right flank region using ultrasound guidance. When the needle tip was identified within the dilated renal collecting system, the stylet was  removed and again pus was obtained. A small volume of dilute gadolinium was injected in order to perform an antegrade pyelogram which demonstrated hydronephrosis and a dilated renal pelvis. Access was then exchanged for an 035 guidewire which was advanced into the proximal ureter. The access site was dilated and a 10 French pigtail catheter was advanced over the guidewire. The pigtail catheters were locked in position. Retention sutures were applied bilaterally. Sterile dressing was applied bilaterally. Catheters were connected to gravity drainage. IMPRESSION: Bilateral 10 French percutaneous nephrostomy tubes placed as described above. Pus was removed from the collecting system bilaterally and sent to pathology for culture and sensitivity. Electronically Signed   By: Cordella Banner   On: 04/01/2024 12:41        Scheduled Meds:  amLODipine   5 mg Oral Daily   Chlorhexidine  Gluconate Cloth  6 each Topical Q0600   feeding supplement  237 mL Oral BID BM   folic acid   1 mg Oral Daily   lidocaine   20 mL Intradermal Once   metoprolol  tartrate  12.5 mg Oral BID   pantoprazole   40 mg Oral Daily   sodium bicarbonate  650 mg Oral TID   sodium chloride  flush  5 mL Intracatheter Q8H   vitamin B-12  250 mcg Oral Daily     LOS: 5 days    Time spent: 55 minutes    Aithan Farrelly JONETTA Fairly, DO [facility]  If 7PM-7AM, please contact night-coverage [email or web address] 04/02/2024, 12:01 PM

## 2024-04-02 NOTE — Progress Notes (Signed)
 Patient began having nausea and Vomiting at 1915 and was given Zofran  IV in which did not seem to be effective. Patient spiked a 102 fever and was Tachy in the 160's. Patient complained of Back pain as well as nausea and discomfort. EKG done on the patient showed SVT .On-call morrison was contacted and patient was given IV Metoprolol  and IV tylenol  for fever. ICU transfer orders were given. Report was called and given to [facility], and patient was then transferred off the unit.

## 2024-04-03 DIAGNOSIS — N179 Acute kidney failure, unspecified: Secondary | ICD-10-CM | POA: Diagnosis not present

## 2024-04-03 DIAGNOSIS — N185 Chronic kidney disease, stage 5: Secondary | ICD-10-CM | POA: Diagnosis not present

## 2024-04-03 LAB — CBC
HCT: 23.6 % — ABNORMAL LOW (ref 36.0–46.0)
Hemoglobin: 7.5 g/dL — ABNORMAL LOW (ref 12.0–15.0)
MCH: 31.4 pg (ref 26.0–34.0)
MCHC: 31.8 g/dL (ref 30.0–36.0)
MCV: 98.7 fL (ref 80.0–100.0)
Platelets: 324 K/uL (ref 150–400)
RBC: 2.39 MIL/uL — ABNORMAL LOW (ref 3.87–5.11)
RDW: 16.4 % — ABNORMAL HIGH (ref 11.5–15.5)
WBC: 7.2 K/uL (ref 4.0–10.5)
nRBC: 0 % (ref 0.0–0.2)

## 2024-04-03 LAB — RENAL FUNCTION PANEL
Albumin: 2.4 g/dL — ABNORMAL LOW (ref 3.5–5.0)
Anion gap: 10 (ref 5–15)
BUN: 24 mg/dL — ABNORMAL HIGH (ref 8–23)
CO2: 21 mmol/L — ABNORMAL LOW (ref 22–32)
Calcium: 7.8 mg/dL — ABNORMAL LOW (ref 8.9–10.3)
Chloride: 106 mmol/L (ref 98–111)
Creatinine, Ser: 1.86 mg/dL — ABNORMAL HIGH (ref 0.44–1.00)
GFR, Estimated: 27 mL/min — ABNORMAL LOW (ref >60)
Glucose, Bld: 96 mg/dL (ref 70–99)
Phosphorus: 2.8 mg/dL (ref 2.5–4.6)
Potassium: 3.5 mmol/L (ref 3.5–5.1)
Sodium: 137 mmol/L (ref 135–145)

## 2024-04-03 LAB — MAGNESIUM: Magnesium: 1.7 mg/dL (ref 1.7–2.4)

## 2024-04-03 MED ORDER — LOPERAMIDE HCL 2 MG PO CAPS
2.0000 mg | ORAL_CAPSULE | ORAL | Status: DC | PRN
  Administered 2024-04-03 – 2024-04-07 (×3): 2 mg via ORAL
  Filled 2024-04-03 (×3): qty 1

## 2024-04-03 NOTE — Progress Notes (Signed)
 PROGRESS NOTE    Robin Blackburn  FMW:[id number] DOB: 18-Jul-1945 DOA: 03/28/2024 PCP: Bertell Satterfield, MD   Brief Narrative:    Robin Blackburn is a 78 y.o. female with medical history significant of hypertension, rheumatoid arthritis, fibromyalgia, chronic pain, prior stroke, bladder outlet obstruction who presents to the emergency department via EMS due to abnormal lab.  Patient states that she was told that her kidney function was worsening, so she was sent to the ED for further evaluation and management.  She complained about nausea generalized weakness and abdominal pain but denies any discomfort with her urinary catheter.  Also denies fever, chills, cough, chest pain, shortness of breath, nausea, vomiting.  CT abdomen and pelvis without contrast showed stable bilateral hydroureteronephrosis showed concern for cystitis and urinary tract infection.  Bilateral pleural effusions, right greater than left, increased since prior exam. Foley catheter was changed, patient was started on IV meropenem , Zofran  was given due to nausea and IV hydration was provided.  Patient is admitted to the hospitalist service for further management evaluation of AKI, metabolic acidosis, UTI, pleural effusions.  Patient was noted to have some tachycardia as well as fever overnight and was shifted to the ICU for closer evaluation and was started on IV Merrem  as well as Diflucan given some of the findings in her urine cultures from bilateral nephrostomy tube placement that took place on 10/24.  Assessment & Plan:   Principal Problem:   Acute kidney injury superimposed on stage 5 chronic kidney disease, not on chronic dialysis [facility]) Active Problems:   Urinary tract infection   Essential hypertension   Rheumatoid arthritis (HCC)   GERD (gastroesophageal reflux disease)   Metabolic acidosis   Bilateral pleural effusion   Obstructive uropathy   Macrocytic anemia   Hypoalbuminemia due to protein-calorie  malnutrition  Assessment and Plan:   Acute kidney injury superimposed on stage III chronic kidney disease Obstructive uropathy status post bilateral percutaneous nephrostomy tube placement 10/24 Creatinine downward trending (baseline creatinine at 1.3-1.8) continue to monitor in a.m. IV hydration was provided in the ED. Will stop fluids, she eating fair. Renally adjust medications, avoid nephrotoxic agents/dehydration/hypotension Patient is agreeable to HD if needed, but appears to be a poor candidate per nephrology Foley catheter discontinued 10/24   Metabolic acidosis possibly secondary to above-improving Low bicarb in the setting of kidney dysfunction. Low calcium , high phos noted. Appreciate ongoing nephrology evaluation   Fungal UTI Fungal blood cultures pending, but routine blood cultures negative CT abdomen and pelvis without contrast was suggestive of UTI Urine culture done on 01/04/2024 was positive for Klebsiella pneumoniae (ESBL) which was only sensitive to imipenem and gentamicin. Meropenem  restarted as well as Diflucan on 10/24, plan to discontinue Merrem  today and just maintain on Diflucan based on cultures   Bilateral pleural effusion CT abdomen and pelvis without contrast showed bilateral pleural effusions, right greater than left, increased since prior exam. Right-sided thoracentesis done with removed. Follow fluid analysis.   Macrocytic anemia MCV 100.7, hemoglobin 8.9, hematocrit 29.4 Folate and vitamin B12 levels normal. Continue to monitor and transfuse as needed for hemoglobin less than 7   Hypoalbuminemia possibly secondary to moderate protein calorie malnutrition Albumin 2.7, protein supplement will be provided   Essential Hypertension Continue metoprolol  12.5 twice daily, increased Norvasc  to 5mg .   Rheumatoid arthritis Stable, Leflunomide  and adalimumab will be held in the setting of acute infection   GERD Continue Protonix    DVT  prophylaxis: SCDs Code Status: Full Family Communication: HCPOA/friend  at bedside 10/26 Disposition Plan:  Status is: Inpatient Remains inpatient appropriate because: Need for IV medications  Consultants:  Nephrology Discussed with Urology Dr. Sherrilee IR Palliative  Procedures:  None  Antimicrobials:  Anti-infectives (From admission, onward)    Start     Dose/Rate Route Frequency Ordered Stop   04/02/24 0015  meropenem  (MERREM ) 500 mg in sodium chloride  0.9 % 100 mL IVPB        500 mg 200 mL/hr over 30 Minutes Intravenous Every 12 hours 04/01/24 2325     04/01/24 2359  fluconazole (DIFLUCAN) IVPB 200 mg        200 mg 100 mL/hr over 60 Minutes Intravenous Every 24 hours 04/01/24 2259     04/01/24 2345  fluconazole (DIFLUCAN) IVPB 400 mg  Status:  Discontinued        400 mg 100 mL/hr over 120 Minutes Intravenous Every 24 hours 04/01/24 2252 04/01/24 2259   04/01/24 1000  cefTRIAXone  (ROCEPHIN ) 2 g in sodium chloride  0.9 % 100 mL IVPB        2 g 200 mL/hr over 30 Minutes Intravenous To Radiology 03/31/24 1554 04/01/24 1025   03/28/24 2200  meropenem  (MERREM ) 1 g in sodium chloride  0.9 % 100 mL IVPB  Status:  Discontinued        1 g 200 mL/hr over 30 Minutes Intravenous Every 8 hours 03/28/24 2036 03/28/24 2036   03/28/24 2200  meropenem  (MERREM ) 1 g in sodium chloride  0.9 % 100 mL IVPB  Status:  Discontinued        1 g 200 mL/hr over 30 Minutes Intravenous Every 12 hours 03/28/24 2036 03/31/24 1243       Subjective: Patient noted to have done well overnight with no further fever or tachycardia noted.  Labs are showing signs of improvement.  Objective: Vitals:   04/03/24 0500 04/03/24 0600 04/03/24 0700 04/03/24 1132  BP: 126/72     Pulse: 91 88 91   Resp:  18 20   Temp:    98 F (36.7 C)  TempSrc:    Oral  SpO2: 97% 96% 95%   Weight:      Height:        Intake/Output Summary (Last 24 hours) at 04/03/2024 1156 Last data filed at 04/03/2024 0800 Gross per 24  hour  Intake 1124.13 ml  Output 840 ml  Net 284.13 ml   Filed Weights   03/28/24 1440 04/01/24 2343  Weight: 72 kg 82.7 kg    Examination:  General exam: Appears calm and comfortable  Respiratory system: Clear to auscultation. Respiratory effort normal. Cardiovascular system: S1 & S2 heard, RRR.  Gastrointestinal system: Abdomen is soft Central nervous system: Alert and awake Extremities: No edema Skin: No significant lesions noted Psychiatry: Flat affect. Foley with clear, yellow urine output noted Nephrostomy tubes with clear, yellow urine output noted    Data Reviewed: I have personally reviewed following labs and imaging studies  CBC: Recent Labs  Lab 03/28/24 1617 03/29/24 0529 03/30/24 0438 03/31/24 0404 04/01/24 0412 04/02/24 0030 04/02/24 0439 04/03/24 0518  WBC 9.2   < > 8.7 8.0 8.8 18.3* 12.8* 7.2  NEUTROABS 6.9  --  6.2  --   --  15.8*  --   --   HGB 8.9*   < > 8.3* 7.6* 7.7* 8.7* 7.7* 7.5*  HCT 29.4*   < > 26.6* 24.7* 24.6* 27.4* 25.1* 23.6*  MCV 100.7*   < > 99.3 98.4 98.4 97.2 98.4 98.7  PLT  465*   < > 439* 352 369 400 318 324   < > = values in this interval not displayed.   Basic Metabolic Panel: Recent Labs  Lab 03/29/24 0529 03/30/24 0438 03/31/24 0404 04/01/24 0412 04/02/24 0030 04/02/24 0439 04/03/24 0518  NA 138   < > 136 136 137 137 137  K 4.1   < > 3.7 4.0 3.8 3.8 3.5  CL 110   < > 109 108 106 107 106  CO2 15*   < > 16* 18* 17* 19* 21*  GLUCOSE 98   < > 112* 106* 136* 133* 96  BUN 35*   < > 33* 30* 28* 28* 24*  CREATININE 3.68*   < > 3.39* 2.68* 2.28* 2.25* 1.86*  CALCIUM  8.0*   < > 7.7* 7.6* 7.9* 7.9* 7.8*  MG 2.0  --   --  1.8 1.8 1.9 1.7  PHOS 4.8*  --   --  2.8  --  3.8 2.8   < > = values in this interval not displayed.   GFR: Estimated Creatinine Clearance: 27 mL/min (A) (by C-G formula based on SCr of 1.86 mg/dL (H)). Liver Function Tests: Recent Labs  Lab 03/28/24 1617 03/29/24 0529 04/01/24 0412 04/02/24 0030  04/02/24 0439 04/03/24 0518  AST <10* <10*  --  13*  --   --   ALT <5 <5  --  <5  --   --   ALKPHOS 143* 137*  --  113  --   --   BILITOT <0.2 <0.2  --  0.3  --   --   PROT 6.0* 5.9*  --  6.0*  --   --   ALBUMIN 2.7* 2.6* 2.4* 2.7* 2.5* 2.4*   Recent Labs  Lab 03/28/24 1617  LIPASE 19   No results for input(s): AMMONIA in the last 168 hours. Coagulation Profile: Recent Labs  Lab 04/01/24 0412  INR 1.8*   Cardiac Enzymes: No results for input(s): CKTOTAL, CKMB, CKMBINDEX, TROPONINI in the last 168 hours. BNP (last 3 results) No results for input(s): PROBNP in the last 8760 hours. HbA1C: No results for input(s): HGBA1C in the last 72 hours. CBG: Recent Labs  Lab 04/02/24 0426  GLUCAP 145*   Lipid Profile: No results for input(s): CHOL, HDL, LDLCALC, TRIG, CHOLHDL, LDLDIRECT in the last 72 hours. Thyroid  Function Tests: No results for input(s): TSH, T4TOTAL, FREET4, T3FREE, THYROIDAB in the last 72 hours. Anemia Panel: No results for input(s): VITAMINB12, FOLATE, FERRITIN, TIBC, IRON, RETICCTPCT in the last 72 hours.  Sepsis Labs: Recent Labs  Lab 04/02/24 0030 04/02/24 0439  LATICACIDVEN 0.8 0.6    Recent Results (from the past 240 hours)  Urine Culture     Status: None   Collection Time: 03/28/24  7:20 PM   Specimen: Urine, Random  Result Value Ref Range Status   Specimen Description   Final    URINE, RANDOM Performed at [facility] [address], [place], [place] [address]    Special Requests   Final    NONE Reflexed from [id number] Performed at [facility], [address] [place], [place] [address]    Culture   Final    NO GROWTH Performed at [facility], [address], [place], [place] [address]    Report Status 03/30/2024 FINAL  Final  Culture, body fluid w Gram Stain-bottle     Status: None (Preliminary result)   Collection Time: 03/30/24  9:05 AM   Specimen: Pleura  Result Value Ref Range  Status   Specimen Description PLEURAL RIGHT  Final   Special Requests   Final    BOTTLES DRAWN AEROBIC AND ANAEROBIC Blood Culture adequate volume   Culture   Final    NO GROWTH 4 DAYS Performed at [facility], [address] [place], [place] [address]    Report Status PENDING  Incomplete  Gram stain     Status: None   Collection Time: 03/30/24  9:05 AM   Specimen: Pleura  Result Value Ref Range Status   Specimen Description PLEURAL  Final   Special Requests NONE  Final   Gram Stain   Final    CYTOSPIN SMEAR NO ORGANISMS SEEN WBC PRESENT,BOTH PMN AND MONONUCLEAR Performed at [facility], [address] [place], [place] [address]    Report Status 03/30/2024 FINAL  Final  Aerobic/Anaerobic Culture w Gram Stain (surgical/deep wound)     Status: None (Preliminary result)   Collection Time: 04/01/24 10:40 AM   Specimen: Kidney  Result Value Ref Range Status   Specimen Description KIDNEY  Final   Special Requests LEFT  Final   Gram Stain   Final    ABUNDANT WBC PRESENT, PREDOMINANTLY PMN MODERATE YEAST    Culture   Final    CULTURE REINCUBATED FOR BETTER GROWTH Performed at [facility], [address] [place], [place] [address]    Report Status PENDING  Incomplete  Body fluid culture w Gram Stain     Status: None (Preliminary result)   Collection Time: 04/01/24 11:06 AM   Specimen: Kidney; Body Fluid  Result Value Ref Range Status   Specimen Description KIDNEY  Final   Special Requests RIGHT RENAL  Final   Gram Stain   Final    FEW WBC PRESENT, PREDOMINANTLY PMN RARE BUDDING YEAST SEEN    Culture   Final    FEW YEAST CULTURE REINCUBATED FOR BETTER GROWTH Performed at [facility], [address], [place], [place] [address]    Report Status PENDING  Incomplete  MRSA Next Gen by PCR, Nasal     Status: Abnormal   Collection Time: 04/01/24 11:01 PM   Specimen: Nasal Mucosa; Nasal Swab  Result Value Ref Range Status   MRSA by PCR Next Gen DETECTED (A) NOT  DETECTED Final    Comment: RESULT CALLED TO, READ BACK BY AND VERIFIED WITH: G ROOS AT 1248 ON [id number] BY S DALTON (NOTE) The GeneXpert MRSA Assay (FDA approved for NASAL specimens only), is one component of a comprehensive MRSA colonization surveillance program. It is not intended to diagnose MRSA infection nor to guide or monitor treatment for MRSA infections. Test performance is not FDA approved in patients less than 20 years old. Performed at [facility], [address] [place], [place] [address]   Culture, blood (Routine X 2) w Reflex to ID Panel     Status: None (Preliminary result)   Collection Time: 04/02/24 12:30 AM   Specimen: Right Antecubital; Blood  Result Value Ref Range Status   Specimen Description RIGHT ANTECUBITAL  Final   Special Requests   Final    BOTTLES DRAWN AEROBIC AND ANAEROBIC Blood Culture adequate volume   Culture   Final    NO GROWTH 1 DAY Performed at [facility], [address], [place], [place] [address]    Report Status PENDING  Incomplete  Culture, blood (Routine X 2) w Reflex to ID Panel     Status: None (Preliminary result)  Collection Time: 04/02/24 12:40 AM   Specimen: BLOOD RIGHT HAND  Result Value Ref Range Status   Specimen Description BLOOD RIGHT HAND  Final   Special Requests   Final    BOTTLES DRAWN AEROBIC AND ANAEROBIC Blood Culture adequate volume   Culture   Final    NO GROWTH 1 DAY Performed at [facility], [address], [place], [place] [address]    Report Status PENDING  Incomplete  Fungus culture, blood     Status: None (Preliminary result)   Collection Time: 04/02/24 11:39 AM   Specimen: BLOOD  Result Value Ref Range Status   Specimen Description BLOOD PICC LINE  Final   Special Requests   Final    BOTTLES DRAWN AEROBIC AND ANAEROBIC Blood Culture adequate volume Performed at [facility], [address], [place], [place] [address]    Culture PENDING  Incomplete   Report Status PENDING  Incomplete          Radiology Studies: DG CHEST PORT 1 VIEW Result Date: 04/02/2024 EXAM: 1 VIEW(S) XRAY OF THE CHEST 04/02/2024 01:12:00 AM COMPARISON: Chest x-ray 03/30/2024. CLINICAL HISTORY: Sepsis (HCC), fever, tachypnea. FINDINGS: LUNGS AND PLEURA: No focal pulmonary opacity. No pulmonary edema. No pleural effusion. No pneumothorax. HEART AND MEDIASTINUM: Heart is enlarged, unchanged. BONES AND SOFT TISSUES: No acute osseous abnormality. IMPRESSION: 1. No acute findings. 2. Enlarged heart, unchanged. Electronically signed by: Greig Pique MD 04/02/2024 01:14 AM EDT RP Workstation: [id number]        Scheduled Meds:  amLODipine   5 mg Oral Daily   Chlorhexidine  Gluconate Cloth  6 each Topical Q0600   feeding supplement  237 mL Oral BID BM   folic acid   1 mg Oral Daily   lidocaine   20 mL Intradermal Once   metoprolol  tartrate  12.5 mg Oral BID   mupirocin ointment   Nasal BID   pantoprazole   40 mg Oral Daily   sodium bicarbonate  650 mg Oral TID   sodium chloride  flush  5 mL Intracatheter Q8H   vitamin B-12  250 mcg Oral Daily     LOS: 6 days    Time spent: 55 minutes    Marzetta Lanza JONETTA Fairly, DO [facility]  If 7PM-7AM, please contact night-coverage [email or web address] 04/03/2024, 11:56 AM

## 2024-04-03 NOTE — Plan of Care (Signed)
 This patient remains on AP-ICCU as of time if writing. The patient is telemetry level of care and is pending transfer. The patient's HCPOA remains at bedside at this time. Bilateral nephrostomy tubes in place without obvious complication. Midline PIV to LUE.    Problem: Education: Goal: Knowledge of General Education information will improve Description: Including pain rating scale, medication(s)/side effects and non-pharmacologic comfort measures Outcome: Progressing   Problem: Health Behavior/Discharge Planning: Goal: Ability to manage health-related needs will improve Outcome: Progressing   Problem: Clinical Measurements: Goal: Ability to maintain clinical measurements within normal limits will improve Outcome: Progressing Goal: Will remain free from infection Outcome: Progressing Goal: Diagnostic test results will improve Outcome: Progressing Goal: Respiratory complications will improve Outcome: Progressing Goal: Cardiovascular complication will be avoided Outcome: Progressing   Problem: Activity: Goal: Risk for activity intolerance will decrease Outcome: Progressing   Problem: Nutrition: Goal: Adequate nutrition will be maintained Outcome: Progressing   Problem: Coping: Goal: Level of anxiety will decrease Outcome: Progressing   Problem: Elimination: Goal: Will not experience complications related to bowel motility Outcome: Progressing Goal: Will not experience complications related to urinary retention Outcome: Progressing   Problem: Pain Managment: Goal: General experience of comfort will improve and/or be controlled Outcome: Progressing   Problem: Safety: Goal: Ability to remain free from injury will improve Outcome: Progressing   Problem: Skin Integrity: Goal: Risk for impaired skin integrity will decrease Outcome: Progressing   Problem: Education: Goal: Knowledge of disease and its progression will improve Outcome: Progressing Goal: Individualized  Educational Video(s) Outcome: Progressing   Problem: Fluid Volume: Goal: Compliance with measures to maintain balanced fluid volume will improve Outcome: Progressing   Problem: Health Behavior/Discharge Planning: Goal: Ability to manage health-related needs will improve Outcome: Progressing   Problem: Nutritional: Goal: Ability to make healthy dietary choices will improve Outcome: Progressing   Problem: Clinical Measurements: Goal: Complications related to the disease process, condition or treatment will be avoided or minimized Outcome: Progressing

## 2024-04-04 DIAGNOSIS — N179 Acute kidney failure, unspecified: Secondary | ICD-10-CM | POA: Diagnosis not present

## 2024-04-04 DIAGNOSIS — N1330 Unspecified hydronephrosis: Secondary | ICD-10-CM | POA: Diagnosis not present

## 2024-04-04 DIAGNOSIS — Z7189 Other specified counseling: Secondary | ICD-10-CM | POA: Diagnosis not present

## 2024-04-04 DIAGNOSIS — Z515 Encounter for palliative care: Secondary | ICD-10-CM | POA: Diagnosis not present

## 2024-04-04 DIAGNOSIS — N185 Chronic kidney disease, stage 5: Secondary | ICD-10-CM | POA: Diagnosis not present

## 2024-04-04 LAB — CBC
HCT: 22.1 % — ABNORMAL LOW (ref 36.0–46.0)
Hemoglobin: 6.9 g/dL — CL (ref 12.0–15.0)
MCH: 30.7 pg (ref 26.0–34.0)
MCHC: 31.2 g/dL (ref 30.0–36.0)
MCV: 98.2 fL (ref 80.0–100.0)
Platelets: 309 K/uL (ref 150–400)
RBC: 2.25 MIL/uL — ABNORMAL LOW (ref 3.87–5.11)
RDW: 15.9 % — ABNORMAL HIGH (ref 11.5–15.5)
WBC: 7.5 K/uL (ref 4.0–10.5)
nRBC: 0 % (ref 0.0–0.2)

## 2024-04-04 LAB — CULTURE, BODY FLUID W GRAM STAIN -BOTTLE
Culture: NO GROWTH
Special Requests: ADEQUATE

## 2024-04-04 LAB — BODY FLUID CULTURE W GRAM STAIN

## 2024-04-04 LAB — RENAL FUNCTION PANEL
Albumin: 2.3 g/dL — ABNORMAL LOW (ref 3.5–5.0)
Anion gap: 10 (ref 5–15)
BUN: 19 mg/dL (ref 8–23)
CO2: 22 mmol/L (ref 22–32)
Calcium: 7.5 mg/dL — ABNORMAL LOW (ref 8.9–10.3)
Chloride: 106 mmol/L (ref 98–111)
Creatinine, Ser: 1.57 mg/dL — ABNORMAL HIGH (ref 0.44–1.00)
GFR, Estimated: 33 mL/min — ABNORMAL LOW (ref >60)
Glucose, Bld: 85 mg/dL (ref 70–99)
Phosphorus: 2.6 mg/dL (ref 2.5–4.6)
Potassium: 3.2 mmol/L — ABNORMAL LOW (ref 3.5–5.1)
Sodium: 138 mmol/L (ref 135–145)

## 2024-04-04 LAB — BASIC METABOLIC PANEL WITH GFR
Anion gap: 9 (ref 5–15)
BUN: 18 mg/dL (ref 8–23)
CO2: 23 mmol/L (ref 22–32)
Calcium: 7.4 mg/dL — ABNORMAL LOW (ref 8.9–10.3)
Chloride: 106 mmol/L (ref 98–111)
Creatinine, Ser: 1.57 mg/dL — ABNORMAL HIGH (ref 0.44–1.00)
GFR, Estimated: 33 mL/min — ABNORMAL LOW (ref >60)
Glucose, Bld: 83 mg/dL (ref 70–99)
Potassium: 3.2 mmol/L — ABNORMAL LOW (ref 3.5–5.1)
Sodium: 138 mmol/L (ref 135–145)

## 2024-04-04 LAB — PREPARE RBC (CROSSMATCH)

## 2024-04-04 LAB — MAGNESIUM: Magnesium: 1.8 mg/dL (ref 1.7–2.4)

## 2024-04-04 MED ORDER — SODIUM CHLORIDE 0.9% IV SOLUTION
Freq: Once | INTRAVENOUS | Status: AC
Start: 2024-04-04 — End: 2024-04-04

## 2024-04-04 MED ORDER — MICAFUNGIN SODIUM 100 MG IV SOLR
100.0000 mg | INTRAVENOUS | Status: DC
  Administered 2024-04-04 – 2024-04-09 (×6): 100 mg via INTRAVENOUS
  Filled 2024-04-04 (×7): qty 5

## 2024-04-04 MED ORDER — POTASSIUM CHLORIDE CRYS ER 20 MEQ PO TBCR
40.0000 meq | EXTENDED_RELEASE_TABLET | Freq: Once | ORAL | Status: AC
  Administered 2024-04-04: 40 meq via ORAL
  Filled 2024-04-04: qty 2

## 2024-04-04 NOTE — Progress Notes (Signed)
 PROGRESS NOTE    Robin Blackburn  FMW:[id number] DOB: 1945/12/29 DOA: 03/28/2024 PCP: Bertell Satterfield, MD   Brief Narrative:    Robin Blackburn is a 78 y.o. female with medical history significant of hypertension, rheumatoid arthritis, fibromyalgia, chronic pain, prior stroke, bladder outlet obstruction who presents to the emergency department via EMS due to abnormal lab.  Patient states that she was told that her kidney function was worsening, so she was sent to the ED for further evaluation and management.  She complained about nausea generalized weakness and abdominal pain but denies any discomfort with her urinary catheter.  Also denies fever, chills, cough, chest pain, shortness of breath, nausea, vomiting.  CT abdomen and pelvis without contrast showed stable bilateral hydroureteronephrosis showed concern for cystitis and urinary tract infection.  Bilateral pleural effusions, right greater than left, increased since prior exam. Foley catheter was changed, patient was started on IV meropenem , Zofran  was given due to nausea and IV hydration was provided.  Patient is admitted to the hospitalist service for further management evaluation of AKI, metabolic acidosis, UTI, pleural effusions.  Patient was noted to have some tachycardia as well as fever overnight and was shifted to the ICU for closer evaluation and was started on IV Merrem  as well as Diflucan given some of the findings in her urine cultures from bilateral nephrostomy tube placement that took place on 10/24.  Patient noted to have worsening anemia on 10/27 for which 1 unit PRBC will be given.  Patient now on micafungin for UTI until further sensitivities return.  Assessment & Plan:   Principal Problem:   Acute kidney injury superimposed on stage 5 chronic kidney disease, not on chronic dialysis [facility]) Active Problems:   Urinary tract infection   Essential hypertension   Rheumatoid arthritis (HCC)   GERD (gastroesophageal reflux  disease)   Metabolic acidosis   Bilateral pleural effusion   Obstructive uropathy   Macrocytic anemia   Hypoalbuminemia due to protein-calorie malnutrition  Assessment and Plan:   Acute kidney injury superimposed on stage III chronic kidney disease Obstructive uropathy status post bilateral percutaneous nephrostomy tube placement 10/24 Creatinine downward trending (baseline creatinine at 1.3-1.8) continue to monitor in a.m. IV hydration was provided in the ED. Will stop fluids, she eating fair. Renally adjust medications, avoid nephrotoxic agents/dehydration/hypotension Patient is agreeable to HD if needed, but appears to be a poor candidate per nephrology Foley catheter discontinued 10/24 Nephrology to sign off today   Metabolic acidosis possibly secondary to above-improving Low bicarb in the setting of kidney dysfunction. Low calcium , high phos noted. Appreciate ongoing nephrology evaluation  Mild hypokalemia Replete and reevaluate in a.m.   Fungal UTI Fungal blood cultures pending, but routine blood cultures negative CT abdomen and pelvis without contrast was suggestive of UTI Urine culture done on 01/04/2024 was positive for Klebsiella pneumoniae (ESBL) which was only sensitive to imipenem and gentamicin. Placed on micafungin starting 10/27 after discussion with pharmacy until further sensitivities returned   Bilateral pleural effusion CT abdomen and pelvis without contrast showed bilateral pleural effusions, right greater than left, increased since prior exam. Right-sided thoracentesis done with removed. Follow fluid analysis.   Macrocytic anemia-worsening MCV 100.7, hemoglobin 8.9, hematocrit 29.4 Folate and vitamin B12 levels normal. Continue to monitor and transfuse as needed for hemoglobin less than 7 1 unit PRBC transfusion 10/27   Hypoalbuminemia possibly secondary to moderate protein calorie malnutrition Albumin 2.7, protein supplement will be provided    Essential Hypertension Continue metoprolol  12.5 twice  daily, increased Norvasc  to 5mg .   Rheumatoid arthritis Stable, Leflunomide  and adalimumab will be held in the setting of acute infection   GERD Continue Protonix    DVT prophylaxis: SCDs Code Status: Full Family Communication: HCPOA/friend at bedside 10/27 Disposition Plan:  Status is: Inpatient Remains inpatient appropriate because: Need for IV medications  Consultants:  Nephrology Discussed with Urology Dr. Sherrilee IR Palliative  Procedures:  None  Antimicrobials:  Anti-infectives (From admission, onward)    Start     Dose/Rate Route Frequency Ordered Stop   04/04/24 1100  micafungin (MYCAMINE) 100 mg in sodium chloride  0.9 % 100 mL IVPB        100 mg 105 mL/hr over 1 Hours Intravenous Every 24 hours 04/04/24 0921     04/02/24 0015  meropenem  (MERREM ) 500 mg in sodium chloride  0.9 % 100 mL IVPB  Status:  Discontinued        500 mg 200 mL/hr over 30 Minutes Intravenous Every 12 hours 04/01/24 2325 04/03/24 1158   04/01/24 2359  fluconazole (DIFLUCAN) IVPB 200 mg  Status:  Discontinued        200 mg 100 mL/hr over 60 Minutes Intravenous Every 24 hours 04/01/24 2259 04/04/24 0921   04/01/24 2345  fluconazole (DIFLUCAN) IVPB 400 mg  Status:  Discontinued        400 mg 100 mL/hr over 120 Minutes Intravenous Every 24 hours 04/01/24 2252 04/01/24 2259   04/01/24 1000  cefTRIAXone  (ROCEPHIN ) 2 g in sodium chloride  0.9 % 100 mL IVPB        2 g 200 mL/hr over 30 Minutes Intravenous To Radiology 03/31/24 1554 04/01/24 1025   03/28/24 2200  meropenem  (MERREM ) 1 g in sodium chloride  0.9 % 100 mL IVPB  Status:  Discontinued        1 g 200 mL/hr over 30 Minutes Intravenous Every 8 hours 03/28/24 2036 03/28/24 2036   03/28/24 2200  meropenem  (MERREM ) 1 g in sodium chloride  0.9 % 100 mL IVPB  Status:  Discontinued        1 g 200 mL/hr over 30 Minutes Intravenous Every 12 hours 03/28/24 2036 03/31/24 1243        Subjective: Patient noted to have done well overnight with no further fever or tachycardia noted.  Patient noted to have some worsening anemia overnight for which 1 unit PRBC transfusion ordered.  Objective: Vitals:   04/04/24 0614 04/04/24 0818 04/04/24 1003 04/04/24 1057  BP: 137/63 (!) 141/70 128/73 127/76  Pulse: 91 98 89 97  Resp: (!) 24  18 20   Temp: 98.9 F (37.2 C) 98.1 F (36.7 C) 98.3 F (36.8 C) 97.8 F (36.6 C)  TempSrc: Oral Oral Oral Oral  SpO2: 96% 95% 94%   Weight: 76.7 kg     Height:        Intake/Output Summary (Last 24 hours) at 04/04/2024 1110 Last data filed at 04/04/2024 0953 Gross per 24 hour  Intake 653.32 ml  Output 1670 ml  Net -1016.68 ml   Filed Weights   03/28/24 1440 04/01/24 2343 04/04/24 0614  Weight: 72 kg 82.7 kg 76.7 kg    Examination:  General exam: Appears calm and comfortable  Respiratory system: Clear to auscultation. Respiratory effort normal. Cardiovascular system: S1 & S2 heard, RRR.  Gastrointestinal system: Abdomen is soft Central nervous system: Alert and awake Extremities: No edema Skin: No significant lesions noted Psychiatry: Flat affect. Foley with clear, yellow urine output noted Nephrostomy tubes with clear, yellow urine output noted  Data Reviewed: I have personally reviewed following labs and imaging studies  CBC: Recent Labs  Lab 03/28/24 1617 03/29/24 0529 03/30/24 0438 03/31/24 0404 04/01/24 0412 04/02/24 0030 04/02/24 0439 04/03/24 0518 04/04/24 0347  WBC 9.2   < > 8.7   < > 8.8 18.3* 12.8* 7.2 7.5  NEUTROABS 6.9  --  6.2  --   --  15.8*  --   --   --   HGB 8.9*   < > 8.3*   < > 7.7* 8.7* 7.7* 7.5* 6.9*  HCT 29.4*   < > 26.6*   < > 24.6* 27.4* 25.1* 23.6* 22.1*  MCV 100.7*   < > 99.3   < > 98.4 97.2 98.4 98.7 98.2  PLT 465*   < > 439*   < > 369 400 318 324 309   < > = values in this interval not displayed.   Basic Metabolic Panel: Recent Labs  Lab 03/29/24 0529 03/30/24 0438  04/01/24 0412 04/02/24 0030 04/02/24 0439 04/03/24 0518 04/04/24 0347  NA 138   < > 136 137 137 137 138  138  K 4.1   < > 4.0 3.8 3.8 3.5 3.2*  3.2*  CL 110   < > 108 106 107 106 106  106  CO2 15*   < > 18* 17* 19* 21* 23  22  GLUCOSE 98   < > 106* 136* 133* 96 83  85  BUN 35*   < > 30* 28* 28* 24* 18  19  CREATININE 3.68*   < > 2.68* 2.28* 2.25* 1.86* 1.57*  1.57*  CALCIUM  8.0*   < > 7.6* 7.9* 7.9* 7.8* 7.4*  7.5*  MG 2.0  --  1.8 1.8 1.9 1.7 1.8  PHOS 4.8*  --  2.8  --  3.8 2.8 2.6   < > = values in this interval not displayed.   GFR: Estimated Creatinine Clearance: 30.9 mL/min (A) (by C-G formula based on SCr of 1.57 mg/dL (H)). Liver Function Tests: Recent Labs  Lab 03/28/24 1617 03/29/24 0529 04/01/24 0412 04/02/24 0030 04/02/24 0439 04/03/24 0518 04/04/24 0347  AST <10* <10*  --  13*  --   --   --   ALT <5 <5  --  <5  --   --   --   ALKPHOS 143* 137*  --  113  --   --   --   BILITOT <0.2 <0.2  --  0.3  --   --   --   PROT 6.0* 5.9*  --  6.0*  --   --   --   ALBUMIN 2.7* 2.6* 2.4* 2.7* 2.5* 2.4* 2.3*   Recent Labs  Lab 03/28/24 1617  LIPASE 19   No results for input(s): AMMONIA in the last 168 hours. Coagulation Profile: Recent Labs  Lab 04/01/24 0412  INR 1.8*   Cardiac Enzymes: No results for input(s): CKTOTAL, CKMB, CKMBINDEX, TROPONINI in the last 168 hours. BNP (last 3 results) No results for input(s): PROBNP in the last 8760 hours. HbA1C: No results for input(s): HGBA1C in the last 72 hours. CBG: Recent Labs  Lab 04/02/24 0426  GLUCAP 145*   Lipid Profile: No results for input(s): CHOL, HDL, LDLCALC, TRIG, CHOLHDL, LDLDIRECT in the last 72 hours. Thyroid  Function Tests: No results for input(s): TSH, T4TOTAL, FREET4, T3FREE, THYROIDAB in the last 72 hours. Anemia Panel: No results for input(s): VITAMINB12, FOLATE, FERRITIN, TIBC, IRON, RETICCTPCT in the last 72 hours.  Sepsis  Labs: Recent Labs  Lab 04/02/24 0030 04/02/24 0439  LATICACIDVEN 0.8 0.6    Recent Results (from the past 240 hours)  Urine Culture     Status: None   Collection Time: 03/28/24  7:20 PM   Specimen: Urine, Random  Result Value Ref Range Status   Specimen Description   Final    URINE, RANDOM Performed at [facility], [address] [place], [place] [address]    Special Requests   Final    NONE Reflexed from [id number] Performed at [facility], [address] [place], [place] [address]    Culture   Final    NO GROWTH Performed at [facility], [address] [place], [place] [address]    Report Status 03/30/2024 FINAL  Final  Culture, body fluid w Gram Stain-bottle     Status: None   Collection Time: 03/30/24  9:05 AM   Specimen: Pleura  Result Value Ref Range Status   Specimen Description PLEURAL RIGHT  Final   Special Requests   Final    BOTTLES DRAWN AEROBIC AND ANAEROBIC Blood Culture adequate volume   Culture   Final    NO GROWTH 5 DAYS Performed at [facility], [address], [place], [place] [address]    Report Status 04/04/2024 FINAL  Final  Gram stain     Status: None   Collection Time: 03/30/24  9:05 AM   Specimen: Pleura  Result Value Ref Range Status   Specimen Description PLEURAL  Final   Special Requests NONE  Final   Gram Stain   Final    CYTOSPIN SMEAR NO ORGANISMS SEEN WBC PRESENT,BOTH PMN AND MONONUCLEAR Performed at [facility], [address], [place], [place] [address]    Report Status 03/30/2024 FINAL  Final  Aerobic/Anaerobic Culture w Gram Stain (surgical/deep wound)     Status: None (Preliminary result)   Collection Time: 04/01/24 10:40 AM   Specimen: Kidney  Result Value Ref Range Status   Specimen Description KIDNEY  Final   Special Requests LEFT  Final   Gram Stain   Final    ABUNDANT WBC PRESENT, PREDOMINANTLY PMN MODERATE YEAST    Culture   Final    ABUNDANT YEAST IDENTIFICATION TO FOLLOW Performed at [facility], [address] [place], [place] [address]    Report Status PENDING  Incomplete  Body fluid culture w Gram Stain     Status: None   Collection Time: 04/01/24 11:06 AM   Specimen: Kidney; Body Fluid  Result Value Ref Range Status   Specimen Description KIDNEY  Final   Special Requests RIGHT RENAL  Final   Gram Stain   Final    FEW WBC PRESENT, PREDOMINANTLY PMN RARE BUDDING YEAST SEEN Performed at [facility], [address] [place], [place] [address]    Culture FEW JANIS DEEMS  Final   Report Status 04/04/2024 FINAL  Final  MRSA Next Gen by PCR, Nasal     Status: Abnormal   Collection Time: 04/01/24 11:01 PM   Specimen: Nasal Mucosa; Nasal Swab  Result Value Ref Range Status   MRSA by PCR Next Gen DETECTED (A) NOT DETECTED Final    Comment: RESULT CALLED TO, READ BACK BY AND VERIFIED WITH: G ROOS AT 1248 ON [id number] BY S DALTON (NOTE) The GeneXpert MRSA Assay (FDA approved for NASAL specimens only), is one component of a comprehensive MRSA colonization surveillance program. It is not intended to diagnose MRSA infection nor  to guide or monitor treatment for MRSA infections. Test performance is not FDA approved in patients less than 38 years old. Performed at [facility], [address] [place], [place] [address]   Culture, blood (Routine X 2) w Reflex to ID Panel     Status: None (Preliminary result)   Collection Time: 04/02/24 12:30 AM   Specimen: Right Antecubital; Blood  Result Value Ref Range Status   Specimen Description RIGHT ANTECUBITAL  Final   Special Requests   Final    BOTTLES DRAWN AEROBIC AND ANAEROBIC Blood Culture adequate volume   Culture   Final    NO GROWTH 2 DAYS Performed at [facility], [address] [place], [place] [address]    Report Status PENDING  Incomplete  Culture, blood (Routine X 2) w Reflex to ID Panel     Status: None (Preliminary result)   Collection Time: 04/02/24 12:40 AM   Specimen: BLOOD RIGHT HAND  Result Value Ref  Range Status   Specimen Description BLOOD RIGHT HAND  Final   Special Requests   Final    BOTTLES DRAWN AEROBIC AND ANAEROBIC Blood Culture adequate volume   Culture   Final    NO GROWTH 2 DAYS Performed at [facility], [address], [place], [place] [address]    Report Status PENDING  Incomplete  Fungus culture, blood     Status: None (Preliminary result)   Collection Time: 04/02/24 11:39 AM   Specimen: BLOOD  Result Value Ref Range Status   Specimen Description BLOOD PICC LINE  Final   Special Requests   Final    BOTTLES DRAWN AEROBIC AND ANAEROBIC Blood Culture adequate volume Performed at [facility], [address] [place], [place] [address]    Culture PENDING  Incomplete   Report Status PENDING  Incomplete         Radiology Studies: No results found.       Scheduled Meds:  sodium chloride    Intravenous Once   amLODipine   5 mg Oral Daily   Chlorhexidine  Gluconate Cloth  6 each Topical Q0600   feeding supplement  237 mL Oral BID BM   folic acid   1 mg Oral Daily   lidocaine   20 mL Intradermal Once   metoprolol  tartrate  12.5 mg Oral BID   mupirocin ointment   Nasal BID   pantoprazole   40 mg Oral Daily   sodium bicarbonate  650 mg Oral TID   sodium chloride  flush  5 mL Intracatheter Q8H   vitamin B-12  250 mcg Oral Daily     LOS: 7 days    Time spent: 55 minutes    Victorhugo Preis JONETTA Fairly, DO [facility]  If 7PM-7AM, please contact night-coverage [email or web address] 04/04/2024, 11:10 AM

## 2024-04-04 NOTE — Progress Notes (Signed)
 [facility] Progress Note    Assessment/ Plan:   AKI on CKD3b Obstructive uropathy -baseline Cr 1.3-1.8 -AKI secondary to prerenal insults and obstructive uropathy. S/P bilateral PCN 10/24 -Cr improving, down to 1.57 (at baseline). Peak Cr 6.28 -c/w supportive care in the interim -needs urology follow up on discharge -Avoid nephrotoxic medications including NSAIDs and iodinated intravenous contrast exposure unless the latter is absolutely indicated.  Preferred narcotic agents for pain control are hydromorphone , fentanyl , and methadone. Morphine should not be used. Avoid Baclofen and avoid oral sodium phosphate and magnesium citrate based laxatives / bowel preps. Continue strict Input and Output monitoring. Will monitor the patient closely with you and intervene or adjust therapy as indicated by changes in clinical status/labs   Fungal UTI -per primary service, on diflucan  Hypokalemia -agree with klorcon 40meq x 1 dose today  Acidosis -resolved, po bicarb supplementation, can decreased to BID on d/c  Bilateral pleural effusion -s/p right thora during admit. Per primary  Anemia -transfuse prn for hgb <7, agree with 1U PRBC today  HTN -BP acceptable  Protein-calorie malnutrition -push protein  Nothing else to add at this junction, will sign off. Will arrange for follow up at our office. Please call with any questions/concerns.  Ephriam Stank, MD [facility]  Subjective:   Patient seen and examined bedside. She reports feeling weak, otherwise no complaints.   Objective:   BP 137/63 (BP Location: Right Arm)   Pulse 91   Temp 98.9 F (37.2 C) (Oral)   Resp (!) 24   Ht 5' 6 (1.676 m)   Wt 76.7 kg   SpO2 96%   BMI 27.29 kg/m   Intake/Output Summary (Last 24 hours) at 04/04/2024 0809 Last data filed at 04/04/2024 0546 Gross per 24 hour  Intake 653.32 ml  Output 1670 ml  Net -1016.68 ml   Weight change:   Physical Exam: Gen:  chronically ill appearing CVS: RRR Resp: normal wob/unlabored Abd: soft, nt/nd Ext: no sig edema Neuro: awake, alert  Imaging: No results found.  Labs: BMET Recent Labs  Lab 03/29/24 0529 03/30/24 9561 03/31/24 0404 04/01/24 0412 04/02/24 0030 04/02/24 0439 04/03/24 0518 04/04/24 0347  NA 138 139 136 136 137 137 137 138  138  K 4.1 3.8 3.7 4.0 3.8 3.8 3.5 3.2*  3.2*  CL 110 111 109 108 106 107 106 106  106  CO2 15* 16* 16* 18* 17* 19* 21* 23  22  GLUCOSE 98 101* 112* 106* 136* 133* 96 83  85  BUN 35* 34* 33* 30* 28* 28* 24* 18  19  CREATININE 3.68* 3.80* 3.39* 2.68* 2.28* 2.25* 1.86* 1.57*  1.57*  CALCIUM  8.0* 7.9* 7.7* 7.6* 7.9* 7.9* 7.8* 7.4*  7.5*  PHOS 4.8*  --   --  2.8  --  3.8 2.8 2.6   CBC Recent Labs  Lab 03/28/24 1617 03/29/24 0529 03/30/24 0438 03/31/24 0404 04/02/24 0030 04/02/24 0439 04/03/24 0518 04/04/24 0347  WBC 9.2   < > 8.7   < > 18.3* 12.8* 7.2 7.5  NEUTROABS 6.9  --  6.2  --  15.8*  --   --   --   HGB 8.9*   < > 8.3*   < > 8.7* 7.7* 7.5* 6.9*  HCT 29.4*   < > 26.6*   < > 27.4* 25.1* 23.6* 22.1*  MCV 100.7*   < > 99.3   < > 97.2 98.4 98.7 98.2  PLT 465*   < >  439*   < > 400 318 324 309   < > = values in this interval not displayed.    Medications:     sodium chloride    Intravenous Once   amLODipine   5 mg Oral Daily   Chlorhexidine  Gluconate Cloth  6 each Topical Q0600   feeding supplement  237 mL Oral BID BM   folic acid   1 mg Oral Daily   lidocaine   20 mL Intradermal Once   metoprolol  tartrate  12.5 mg Oral BID   mupirocin ointment   Nasal BID   pantoprazole   40 mg Oral Daily   potassium chloride   40 mEq Oral Once   sodium bicarbonate  650 mg Oral TID   sodium chloride  flush  5 mL Intracatheter Q8H   vitamin B-12  250 mcg Oral Daily      Ephriam Stank, MD [facility] 04/04/2024, 8:09 AM

## 2024-04-04 NOTE — Plan of Care (Signed)
   Problem: Education: Goal: Knowledge of General Education information will improve Description Including pain rating scale, medication(s)/side effects and non-pharmacologic comfort measures Outcome: Progressing   Problem: Education: Goal: Knowledge of General Education information will improve Description Including pain rating scale, medication(s)/side effects and non-pharmacologic comfort measures Outcome: Progressing

## 2024-04-04 NOTE — Progress Notes (Addendum)
 Nurse at bedside,patient alert and oriented to person,place,and situation,a little confused to time.Patient informed and that the MD had ordered for her to have one unit of Pack Red Blood cells,patient educated and verbalized understanding.Patient c/o anxiety and pain to her left foot,a dull,cramping pain rated a 6,that's constant.Xanax  0.5 mg's given by mouth and Ultram  50 mg's given by mouth per MAR prn for pain an anxiety. Plan of care on going.

## 2024-04-04 NOTE — Progress Notes (Signed)
 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 Updates received. Patient Assessed. Created space and opportunity for patient  and family to explore thoughts and feelings regarding current medical situation.  Today's Discussion: Today before meeting with the patient/family, I reviewed the chart notes including IR procedural note from 04/01/2024, significant event note from 04/01/2024, hospitalist note from yesterday, nurse note from yesterday, nursing note from today, nephrology note from today. I also reviewed vital signs, nursing flowsheets, medication administrations record, labs, and imaging. Labs reviewed include renal function panel which shows hypokalemia in the setting of previously normal potassium status post 40 mEq oral potassium ordered today.  BUN/creatinine substantially improved at 19/1.57 from hospitalization peak of 30/3.80 in the setting of AKI secondary to prerenal and bladder outlet obstruction now status post bilateral nephrostomy tubes.  CBC shows normalized white count at 7.5 in the setting of fungal UTI with peak 18.3 t days agowo Requiring ICU transfer for 1 to 2 days.  CBC also shows drop in hemoglobin to 6.9 today with planned PRBC in the setting of post procedure and previously normal, although low, hemoglobin at 7.5 yesterday and 8.7 two days ago.  Today saw the patient at bedside.  Also present at bedside with the  patient's niece and her friend/HCPOA.  We spent time talking about the events since I last saw her last Thursday including bilateral PCN placement on Friday, acute illness over the weekend requiring ICU transfer and initiation of antifungals.  We talked about her UTI, fungal infection, now off antibiotics but continue Diflucan.  We talked about improvement in her kidney function, her anemia requiring PRBCs today.  We also talked about Foley has been discontinued 04/01/2024 which will prevent further UTIs.  We talked about significant improvement since her acute illness over the weekend requiring ICU.  Overall today she feels much better.  Per the patient's request, I shared that I would return in 2 days to check on her if she is still in the hospital.  Remain hopeful for continued improvement and discharge back to LTC.  She is hopeful to get back to her nursing home before planned [place] treat with neighborhood kids on Friday.  I provided emotional and general support through therapeutic listening, empathy, sharing of stories, and other techniques. I answered all questions and addressed all concerns to the best of my ability.  Review of Systems  Constitutional:        Denies pain in general  Respiratory:  Negative for chest tightness and shortness of breath.   Cardiovascular:  Negative for chest pain.  Gastrointestinal:  Negative for abdominal pain, nausea and vomiting.    Objective:   Primary Diagnoses: Present on Admission:  Acute kidney injury superimposed on stage 5 chronic kidney disease, not on chronic dialysis (HCC)  Metabolic acidosis  Urinary tract infection  Essential hypertension  Rheumatoid arthritis (  HCC)  GERD (gastroesophageal reflux disease)   Vital Signs:  BP (!) 141/70 (BP Location: Right Arm)   Pulse 98   Temp 98.1 F (36.7 C) (Oral)   Resp (!) 24   Ht 5' 6 (1.676 m)   Wt 76.7 kg   SpO2 95%   BMI 27.29 kg/m   Physical Exam Vitals and nursing note reviewed.   Constitutional:      General: She is not in acute distress. HENT:     Head: Normocephalic and atraumatic.  Cardiovascular:     Rate and Rhythm: Normal rate.  Pulmonary:     Effort: Pulmonary effort is normal. No respiratory distress.  Abdominal:     General: Abdomen is flat.  Skin:    General: Skin is warm and dry.  Neurological:     General: No focal deficit present.     Mental Status: She is alert and oriented to person, place, and time.  Psychiatric:        Mood and Affect: Mood normal.        Behavior: Behavior normal.     Palliative Assessment/Data: 40%   Existing Vynca/ACP Documentation: MOST form signed 03/25/2021 Advance directive signed 07/26/2023  Assessment & Plan:   HPI/Patient Profile:  78 y.o. female  with past medical history of hypertension, rheumatoid arthritis, fibromyalgia, chronic pain, prior stroke, bladder outlet obstruction who presents to the emergency department via EMS due to abnormal lab.  She was admitted on 03/28/2024 with AKI on CKD 3, struct of uropathy, metabolic acidosis, urinalysis suspicious for UTI, bilateral pleural effusions, microcytic anemia, hypoalbuminemia, and others.    Palliative medicine was consulted for GOC conversations.  SUMMARY OF RECOMMENDATIONS   Full code Full scope of care Open to hemodialysis if needed/offered, though unlikely at this point Palliative medicine will follow-up in a couple days per patient request if still admitted  Symptom Management:  Per primary team Palliative medicine is available to assist as needed  Code Status: Full Code  Prognosis: Unable to determine  Discharge Planning: LTC SNF  Discussed with: Patient, family, HCPOA, medical team, nursing team  Thank you for allowing us  to participate in the care of FEY COGHILL PMT will continue to support holistically.  Billing based on MDM: Moderate  Detailed review of medical records (labs, imaging, vital signs), medically appropriate exam,  discussed with treatment team, counseling and education to patient, family, & staff, documenting clinical information, medication management, coordination of care  Camellia Kays, NP Palliative Medicine Team  Team Phone # [phone] (Nights/Weekends)  02/05/2021, 8:17 AM

## 2024-04-04 NOTE — Plan of Care (Signed)
   Problem: Education: Goal: Knowledge of General Education information will improve Description: Including pain rating scale, medication(s)/side effects and non-pharmacologic comfort measures Outcome: Progressing   Problem: Nutrition: Goal: Adequate nutrition will be maintained Outcome: Progressing   Problem: Coping: Goal: Level of anxiety will decrease Outcome: Progressing

## 2024-04-04 NOTE — Progress Notes (Signed)
 Patient's potassium was 3.2 this am,Dr Adron Fairly notified. Plan of care on going.

## 2024-04-05 DIAGNOSIS — N179 Acute kidney failure, unspecified: Secondary | ICD-10-CM | POA: Diagnosis not present

## 2024-04-05 DIAGNOSIS — N185 Chronic kidney disease, stage 5: Secondary | ICD-10-CM | POA: Diagnosis not present

## 2024-04-05 LAB — RENAL FUNCTION PANEL
Albumin: 2.5 g/dL — ABNORMAL LOW (ref 3.5–5.0)
Anion gap: 8 (ref 5–15)
BUN: 14 mg/dL (ref 8–23)
CO2: 26 mmol/L (ref 22–32)
Calcium: 7.6 mg/dL — ABNORMAL LOW (ref 8.9–10.3)
Chloride: 106 mmol/L (ref 98–111)
Creatinine, Ser: 1.39 mg/dL — ABNORMAL HIGH (ref 0.44–1.00)
GFR, Estimated: 39 mL/min — ABNORMAL LOW (ref >60)
Glucose, Bld: 91 mg/dL (ref 70–99)
Phosphorus: 2.5 mg/dL (ref 2.5–4.6)
Potassium: 3.7 mmol/L (ref 3.5–5.1)
Sodium: 139 mmol/L (ref 135–145)

## 2024-04-05 LAB — TYPE AND SCREEN
ABO/RH(D): O POS
Antibody Screen: NEGATIVE
Unit division: 0

## 2024-04-05 LAB — BPAM RBC
Blood Product Expiration Date: 202511172359
ISSUE DATE / TIME: 202510271102
Unit Type and Rh: 5100

## 2024-04-05 LAB — MAGNESIUM: Magnesium: 1.7 mg/dL (ref 1.7–2.4)

## 2024-04-05 LAB — CBC
HCT: 26.9 % — ABNORMAL LOW (ref 36.0–46.0)
Hemoglobin: 8.5 g/dL — ABNORMAL LOW (ref 12.0–15.0)
MCH: 29.6 pg (ref 26.0–34.0)
MCHC: 31.6 g/dL (ref 30.0–36.0)
MCV: 93.7 fL (ref 80.0–100.0)
Platelets: 309 K/uL (ref 150–400)
RBC: 2.87 MIL/uL — ABNORMAL LOW (ref 3.87–5.11)
RDW: 17.4 % — ABNORMAL HIGH (ref 11.5–15.5)
WBC: 8.3 K/uL (ref 4.0–10.5)
nRBC: 0 % (ref 0.0–0.2)

## 2024-04-05 MED ORDER — TRAZODONE HCL 50 MG PO TABS
100.0000 mg | ORAL_TABLET | Freq: Every day | ORAL | Status: DC
  Administered 2024-04-05 – 2024-04-08 (×4): 100 mg via ORAL
  Filled 2024-04-05 (×4): qty 2

## 2024-04-05 MED ORDER — CYCLOSPORINE 0.05 % OP EMUL
1.0000 [drp] | Freq: Two times a day (BID) | OPHTHALMIC | Status: DC
  Administered 2024-04-05 – 2024-04-09 (×8): 1 [drp] via OPHTHALMIC
  Filled 2024-04-05 (×8): qty 30

## 2024-04-05 MED ORDER — TICAGRELOR 60 MG PO TABS
60.0000 mg | ORAL_TABLET | Freq: Every day | ORAL | Status: DC
Start: 2024-04-05 — End: 2024-04-09
  Administered 2024-04-05 – 2024-04-09 (×5): 60 mg via ORAL
  Filled 2024-04-05 (×6): qty 1

## 2024-04-05 MED ORDER — TRAMADOL HCL 50 MG PO TABS
50.0000 mg | ORAL_TABLET | Freq: Once | ORAL | Status: AC
  Administered 2024-04-05: 50 mg via ORAL
  Filled 2024-04-05: qty 1

## 2024-04-05 NOTE — Progress Notes (Signed)
 Pt complained of persistent left leg pain, stated she felt miserable. MD Opyd notified, new order place for an extra dose of 50mg  Tramadol . Plan of care on going

## 2024-04-05 NOTE — Progress Notes (Signed)
 Nurse at bedside,patient alert and oriented times four.Patient c/o anxiety and nausea this am.Xanax  0.5 mg's given by mouth and Zofran  4 mg's given by mouth per MAR prn for anxiety and nausea. Plan of care on going.

## 2024-04-05 NOTE — Plan of Care (Signed)
   Problem: Education: Goal: Knowledge of General Education information will improve Description Including pain rating scale, medication(s)/side effects and non-pharmacologic comfort measures Outcome: Progressing   Problem: Education: Goal: Knowledge of General Education information will improve Description Including pain rating scale, medication(s)/side effects and non-pharmacologic comfort measures Outcome: Progressing

## 2024-04-05 NOTE — Progress Notes (Signed)
 PROGRESS NOTE    [name]  FMW:[id number] DOB: April 05, 1946 DOA: 03/28/2024 PCP: Bertell Satterfield, MD   Brief Narrative:    Robin Blackburn is a 78 y.o. female with medical history significant of hypertension, rheumatoid arthritis, fibromyalgia, chronic pain, prior stroke, bladder outlet obstruction who presents to the emergency department via EMS due to abnormal lab.  Patient states that she was told that her kidney function was worsening, so she was sent to the ED for further evaluation and management.  She complained about nausea generalized weakness and abdominal pain but denies any discomfort with her urinary catheter.  Also denies fever, chills, cough, chest pain, shortness of breath, nausea, vomiting.  CT abdomen and pelvis without contrast showed stable bilateral hydroureteronephrosis showed concern for cystitis and urinary tract infection.  Bilateral pleural effusions, right greater than left, increased since prior exam. Foley catheter was changed, patient was started on IV meropenem , Zofran  was given due to nausea and IV hydration was provided.  Patient is admitted to the hospitalist service for further management evaluation of AKI, metabolic acidosis, UTI, pleural effusions.  Patient was noted to have some tachycardia as well as fever overnight and was shifted to the ICU for closer evaluation and was started on IV Merrem  as well as Diflucan given some of the findings in her urine cultures from bilateral nephrostomy tube placement that took place on 10/24.  Patient noted to have worsening anemia on 10/27 for which 1 unit PRBC will be given.  Patient now on micafungin for UTI until further sensitivities return.  Assessment & Plan:   Principal Problem:   Acute kidney injury superimposed on stage 5 chronic kidney disease, not on chronic dialysis (HCC) Active Problems:   Urinary tract infection   Essential hypertension   Rheumatoid arthritis (HCC)   GERD (gastroesophageal reflux  disease)   Metabolic acidosis   Bilateral pleural effusion   Obstructive uropathy   Macrocytic anemia   Hypoalbuminemia due to protein-calorie malnutrition  Assessment and Plan:   Acute kidney injury superimposed on stage III chronic kidney disease Obstructive uropathy status post bilateral percutaneous nephrostomy tube placement 10/24-improving Creatinine downward trending (baseline creatinine at 1.3-1.8) continue to monitor in a.m. IV hydration was provided in the ED. Will stop fluids, she eating fair. Renally adjust medications, avoid nephrotoxic agents/dehydration/hypotension Patient is agreeable to HD if needed, but appears to be a poor candidate per nephrology Foley catheter discontinued 10/24 Nephrology has signed off   Fungal UTI Fungal blood cultures pending, but routine blood cultures negative CT abdomen and pelvis without contrast was suggestive of UTI Urine culture done on 01/04/2024 was positive for Klebsiella pneumoniae (ESBL) which was only sensitive to imipenem and gentamicin. Placed on micafungin starting 10/27 after discussion with pharmacy until further sensitivities returned   Bilateral pleural effusion CT abdomen and pelvis without contrast showed bilateral pleural effusions, right greater than left, increased since prior exam. Right-sided thoracentesis done with removed. Follow fluid analysis.   Macrocytic anemia MCV 100.7, hemoglobin 8.9, hematocrit 29.4 Folate and vitamin B12 levels normal. Continue to monitor and transfuse as needed for hemoglobin less than 7 1 unit PRBC transfusion 10/27 Follow a.m. CBC   Hypoalbuminemia possibly secondary to moderate protein calorie malnutrition Albumin 2.7, protein supplement will be provided   Essential Hypertension Continue metoprolol  12.5 twice daily, increased Norvasc  to 5mg .   Rheumatoid arthritis Stable, Leflunomide  and adalimumab will be held in the setting of acute infection   GERD Continue  Protonix    DVT prophylaxis:  SCDs Code Status: Full Family Communication: HCPOA/friend at bedside 10/27 Disposition Plan:  Status is: Inpatient Remains inpatient appropriate because: Need for IV medications  Consultants:  Nephrology Discussed with Urology Dr. Sherrilee IR Palliative  Procedures:  None  Antimicrobials:  Anti-infectives (From admission, onward)    Start     Dose/Rate Route Frequency Ordered Stop   04/04/24 1100  micafungin (MYCAMINE) 100 mg in sodium chloride  0.9 % 100 mL IVPB        100 mg 105 mL/hr over 1 Hours Intravenous Every 24 hours 04/04/24 0921     04/02/24 0015  meropenem  (MERREM ) 500 mg in sodium chloride  0.9 % 100 mL IVPB  Status:  Discontinued        500 mg 200 mL/hr over 30 Minutes Intravenous Every 12 hours 04/01/24 2325 04/03/24 1158   04/01/24 2359  fluconazole (DIFLUCAN) IVPB 200 mg  Status:  Discontinued        200 mg 100 mL/hr over 60 Minutes Intravenous Every 24 hours 04/01/24 2259 04/04/24 0921   04/01/24 2345  fluconazole (DIFLUCAN) IVPB 400 mg  Status:  Discontinued        400 mg 100 mL/hr over 120 Minutes Intravenous Every 24 hours 04/01/24 2252 04/01/24 2259   04/01/24 1000  cefTRIAXone  (ROCEPHIN ) 2 g in sodium chloride  0.9 % 100 mL IVPB        2 g 200 mL/hr over 30 Minutes Intravenous To Radiology 03/31/24 1554 04/01/24 1025   03/28/24 2200  meropenem  (MERREM ) 1 g in sodium chloride  0.9 % 100 mL IVPB  Status:  Discontinued        1 g 200 mL/hr over 30 Minutes Intravenous Every 8 hours 03/28/24 2036 03/28/24 2036   03/28/24 2200  meropenem  (MERREM ) 1 g in sodium chloride  0.9 % 100 mL IVPB  Status:  Discontinued        1 g 200 mL/hr over 30 Minutes Intravenous Every 12 hours 03/28/24 2036 03/31/24 1243       Subjective: Patient noted to have done well overnight with no further fever or tachycardia noted.  Patient noted to have some worsening anemia overnight for which 1 unit PRBC transfusion ordered.  Objective: Vitals:    04/04/24 1945 04/04/24 2132 04/05/24 0434 04/05/24 0833  BP: 130/75 130/75 120/67 (!) 141/81  Pulse: 100 100 93 95  Resp: 20  19   Temp: 98.1 F (36.7 C)  98.5 F (36.9 C) (!) 97.4 F (36.3 C)  TempSrc: Oral  Oral Oral  SpO2: 95%  94% 98%  Weight:      Height:        Intake/Output Summary (Last 24 hours) at 04/05/2024 1151 Last data filed at 04/05/2024 0524 Gross per 24 hour  Intake 663.77 ml  Output 850 ml  Net -186.23 ml   Filed Weights   03/28/24 1440 04/01/24 2343 04/04/24 0614  Weight: 72 kg 82.7 kg 76.7 kg    Examination:  General exam: Appears calm and comfortable  Respiratory system: Clear to auscultation. Respiratory effort normal. Cardiovascular system: S1 & S2 heard, RRR.  Gastrointestinal system: Abdomen is soft Central nervous system: Alert and awake Extremities: No edema Skin: No significant lesions noted Psychiatry: Flat affect. Foley with clear, yellow urine output noted Nephrostomy tubes with clear, yellow urine output noted    Data Reviewed: I have personally reviewed following labs and imaging studies  CBC: Recent Labs  Lab 03/30/24 0438 03/31/24 0404 04/02/24 0030 04/02/24 0439 04/03/24 0518 04/04/24 0347 04/05/24 0400  WBC 8.7   < > 18.3* 12.8* 7.2 7.5 8.3  NEUTROABS 6.2  --  15.8*  --   --   --   --   HGB 8.3*   < > 8.7* 7.7* 7.5* 6.9* 8.5*  HCT 26.6*   < > 27.4* 25.1* 23.6* 22.1* 26.9*  MCV 99.3   < > 97.2 98.4 98.7 98.2 93.7  PLT 439*   < > 400 318 324 309 309   < > = values in this interval not displayed.   Basic Metabolic Panel: Recent Labs  Lab 04/01/24 0412 04/02/24 0030 04/02/24 0439 04/03/24 0518 04/04/24 0347 04/05/24 0400  NA 136 137 137 137 138  138 139  K 4.0 3.8 3.8 3.5 3.2*  3.2* 3.7  CL 108 106 107 106 106  106 106  CO2 18* 17* 19* 21* 23  22 26   GLUCOSE 106* 136* 133* 96 83  85 91  BUN 30* 28* 28* 24* 18  19 14   CREATININE 2.68* 2.28* 2.25* 1.86* 1.57*  1.57* 1.39*  CALCIUM  7.6* 7.9* 7.9* 7.8*  7.4*  7.5* 7.6*  MG 1.8 1.8 1.9 1.7 1.8 1.7  PHOS 2.8  --  3.8 2.8 2.6 2.5   GFR: Estimated Creatinine Clearance: 34.9 mL/min (A) (by C-G formula based on SCr of 1.39 mg/dL (H)). Liver Function Tests: Recent Labs  Lab 04/02/24 0030 04/02/24 0439 04/03/24 0518 04/04/24 0347 04/05/24 0400  AST 13*  --   --   --   --   ALT <5  --   --   --   --   ALKPHOS 113  --   --   --   --   BILITOT 0.3  --   --   --   --   PROT 6.0*  --   --   --   --   ALBUMIN 2.7* 2.5* 2.4* 2.3* 2.5*   No results for input(s): LIPASE, AMYLASE in the last 168 hours.  No results for input(s): AMMONIA in the last 168 hours. Coagulation Profile: Recent Labs  Lab 04/01/24 0412  INR 1.8*   Cardiac Enzymes: No results for input(s): CKTOTAL, CKMB, CKMBINDEX, TROPONINI in the last 168 hours. BNP (last 3 results) No results for input(s): PROBNP in the last 8760 hours. HbA1C: No results for input(s): HGBA1C in the last 72 hours. CBG: Recent Labs  Lab 04/02/24 0426  GLUCAP 145*   Lipid Profile: No results for input(s): CHOL, HDL, LDLCALC, TRIG, CHOLHDL, LDLDIRECT in the last 72 hours. Thyroid  Function Tests: No results for input(s): TSH, T4TOTAL, FREET4, T3FREE, THYROIDAB in the last 72 hours. Anemia Panel: No results for input(s): VITAMINB12, FOLATE, FERRITIN, TIBC, IRON, RETICCTPCT in the last 72 hours.  Sepsis Labs: Recent Labs  Lab 04/02/24 0030 04/02/24 0439  LATICACIDVEN 0.8 0.6    Recent Results (from the past 240 hours)  Urine Culture     Status: None   Collection Time: 03/28/24  7:20 PM   Specimen: Urine, Random  Result Value Ref Range Status   Specimen Description   Final    URINE, RANDOM Performed at [facility], [address] [place], [place] [address]    Special Requests   Final    NONE Reflexed from [id number] Performed at [facility], [address], [place], [place] [address]    Culture   Final    NO GROWTH Performed at  [facility], [address], [place], [place] [address]    Report Status  03/30/2024 FINAL  Final  Culture, body fluid w Gram Stain-bottle     Status: None   Collection Time: 03/30/24  9:05 AM   Specimen: Pleura  Result Value Ref Range Status   Specimen Description PLEURAL RIGHT  Final   Special Requests   Final    BOTTLES DRAWN AEROBIC AND ANAEROBIC Blood Culture adequate volume   Culture   Final    NO GROWTH 5 DAYS Performed at [facility], [address], [place], [place] [address]    Report Status 04/04/2024 FINAL  Final  Gram stain     Status: None   Collection Time: 03/30/24  9:05 AM   Specimen: Pleura  Result Value Ref Range Status   Specimen Description PLEURAL  Final   Special Requests NONE  Final   Gram Stain   Final    CYTOSPIN SMEAR NO ORGANISMS SEEN WBC PRESENT,BOTH PMN AND MONONUCLEAR Performed at [facility], [address], [place], [place] [address]    Report Status 03/30/2024 FINAL  Final  Aerobic/Anaerobic Culture w Gram Stain (surgical/deep wound)     Status: None (Preliminary result)   Collection Time: 04/01/24 10:40 AM   Specimen: Kidney  Result Value Ref Range Status   Specimen Description KIDNEY  Final   Special Requests LEFT  Final   Gram Stain   Final    ABUNDANT WBC PRESENT, PREDOMINANTLY PMN MODERATE YEAST Performed at [facility], [address] [place], [place] [address]    Culture   Final    ABUNDANT CANDIDA GLABRATA Sent to [facility] for further susceptibility testing. NO ANAEROBES ISOLATED; CULTURE IN PROGRESS FOR 5 DAYS    Report Status PENDING  Incomplete  Body fluid culture w Gram Stain     Status: None   Collection Time: 04/01/24 11:06 AM   Specimen: Kidney; Body Fluid  Result Value Ref Range Status   Specimen Description KIDNEY  Final   Special Requests RIGHT RENAL  Final   Gram Stain   Final    FEW WBC PRESENT, PREDOMINANTLY PMN RARE BUDDING YEAST SEEN Performed at [facility], [address],  [place], [place] [address]    Culture FEW JANIS DEEMS  Final   Report Status 04/04/2024 FINAL  Final  MRSA Next Gen by PCR, Nasal     Status: Abnormal   Collection Time: 04/01/24 11:01 PM   Specimen: Nasal Mucosa; Nasal Swab  Result Value Ref Range Status   MRSA by PCR Next Gen DETECTED (A) NOT DETECTED Final    Comment: RESULT CALLED TO, READ BACK BY AND VERIFIED WITH: G ROOS AT 1248 ON [id number] BY S DALTON (NOTE) The GeneXpert MRSA Assay (FDA approved for NASAL specimens only), is one component of a comprehensive MRSA colonization surveillance program. It is not intended to diagnose MRSA infection nor to guide or monitor treatment for MRSA infections. Test performance is not FDA approved in patients less than 10 years old. Performed at [facility], [address], [place], [place] [address]   Culture, blood (Routine X 2) w Reflex to ID Panel     Status: None (Preliminary result)   Collection Time: 04/02/24 12:30 AM   Specimen: Right Antecubital; Blood  Result Value Ref Range Status   Specimen Description RIGHT ANTECUBITAL  Final   Special Requests   Final    BOTTLES DRAWN AEROBIC AND ANAEROBIC Blood Culture adequate volume   Culture   Final    NO GROWTH 3 DAYS Performed at [facility],  [address], [place], [place] [address]    Report Status PENDING  Incomplete  Culture, blood (Routine X 2) w Reflex to ID Panel     Status: None (Preliminary result)   Collection Time: 04/02/24 12:40 AM   Specimen: BLOOD RIGHT HAND  Result Value Ref Range Status   Specimen Description BLOOD RIGHT HAND  Final   Special Requests   Final    BOTTLES DRAWN AEROBIC AND ANAEROBIC Blood Culture adequate volume   Culture   Final    NO GROWTH 3 DAYS Performed at [facility], [address], [place], [place] [address]    Report Status PENDING  Incomplete  Fungus culture, blood     Status: None (Preliminary result)   Collection Time: 04/02/24 11:39 AM   Specimen: BLOOD  Result Value Ref Range  Status   Specimen Description BLOOD PICC LINE  Final   Special Requests   Final    BOTTLES DRAWN AEROBIC AND ANAEROBIC Blood Culture adequate volume Performed at [facility], [address] [place], [place] [address]    Culture PENDING  Incomplete   Report Status PENDING  Incomplete         Radiology Studies: No results found.       Scheduled Meds:  amLODipine   5 mg Oral Daily   Chlorhexidine  Gluconate Cloth  6 each Topical Q0600   feeding supplement  237 mL Oral BID BM   folic acid   1 mg Oral Daily   metoprolol  tartrate  12.5 mg Oral BID   mupirocin ointment   Nasal BID   pantoprazole   40 mg Oral Daily   sodium bicarbonate  650 mg Oral TID   sodium chloride  flush  5 mL Intracatheter Q8H   ticagrelor   60 mg Oral Daily   traZODone   100 mg Oral QHS   vitamin B-12  250 mcg Oral Daily     LOS: 8 days    Time spent: 55 minutes    Hagan Vanauken JONETTA Fairly, DO [facility]  If 7PM-7AM, please contact night-coverage [email or web address] 04/05/2024, 11:51 AM

## 2024-04-06 DIAGNOSIS — Z7189 Other specified counseling: Secondary | ICD-10-CM | POA: Diagnosis not present

## 2024-04-06 DIAGNOSIS — Z515 Encounter for palliative care: Secondary | ICD-10-CM | POA: Diagnosis not present

## 2024-04-06 DIAGNOSIS — N179 Acute kidney failure, unspecified: Secondary | ICD-10-CM | POA: Diagnosis not present

## 2024-04-06 DIAGNOSIS — N185 Chronic kidney disease, stage 5: Secondary | ICD-10-CM | POA: Diagnosis not present

## 2024-04-06 DIAGNOSIS — N1330 Unspecified hydronephrosis: Secondary | ICD-10-CM | POA: Diagnosis not present

## 2024-04-06 LAB — CBC
HCT: 31 % — ABNORMAL LOW (ref 36.0–46.0)
Hemoglobin: 10 g/dL — ABNORMAL LOW (ref 12.0–15.0)
MCH: 30.2 pg (ref 26.0–34.0)
MCHC: 32.3 g/dL (ref 30.0–36.0)
MCV: 93.7 fL (ref 80.0–100.0)
Platelets: 353 K/uL (ref 150–400)
RBC: 3.31 MIL/uL — ABNORMAL LOW (ref 3.87–5.11)
RDW: 17.1 % — ABNORMAL HIGH (ref 11.5–15.5)
WBC: 7.9 K/uL (ref 4.0–10.5)
nRBC: 0 % (ref 0.0–0.2)

## 2024-04-06 LAB — RENAL FUNCTION PANEL
Albumin: 2.7 g/dL — ABNORMAL LOW (ref 3.5–5.0)
Anion gap: 9 (ref 5–15)
BUN: 11 mg/dL (ref 8–23)
CO2: 28 mmol/L (ref 22–32)
Calcium: 8.1 mg/dL — ABNORMAL LOW (ref 8.9–10.3)
Chloride: 104 mmol/L (ref 98–111)
Creatinine, Ser: 1.29 mg/dL — ABNORMAL HIGH (ref 0.44–1.00)
GFR, Estimated: 42 mL/min — ABNORMAL LOW (ref >60)
Glucose, Bld: 101 mg/dL — ABNORMAL HIGH (ref 70–99)
Phosphorus: 2.4 mg/dL — ABNORMAL LOW (ref 2.5–4.6)
Potassium: 3.2 mmol/L — ABNORMAL LOW (ref 3.5–5.1)
Sodium: 141 mmol/L (ref 135–145)

## 2024-04-06 LAB — MAGNESIUM: Magnesium: 1.7 mg/dL (ref 1.7–2.4)

## 2024-04-06 MED ORDER — POTASSIUM CHLORIDE 20 MEQ PO PACK
40.0000 meq | PACK | Freq: Once | ORAL | Status: AC
  Administered 2024-04-06: 40 meq via ORAL
  Filled 2024-04-06: qty 2

## 2024-04-06 NOTE — Progress Notes (Signed)
 Patient's potasium was 3.2 this am,Dr Pratik notified. Plan of care on going.

## 2024-04-06 NOTE — Plan of Care (Signed)
  Problem: Clinical Measurements: Goal: Diagnostic test results will improve Outcome: Progressing Goal: Respiratory complications will improve Outcome: Progressing Goal: Cardiovascular complication will be avoided Outcome: Progressing   Problem: Activity: Goal: Risk for activity intolerance will decrease Outcome: Progressing   Problem: Coping: Goal: Level of anxiety will decrease Outcome: Progressing   Problem: Elimination: Goal: Will not experience complications related to bowel motility Outcome: Progressing Goal: Will not experience complications related to urinary retention Outcome: Progressing   Problem: Pain Managment: Goal: General experience of comfort will improve and/or be controlled Outcome: Progressing   Problem: Safety: Goal: Ability to remain free from injury will improve Outcome: Progressing   Problem: Skin Integrity: Goal: Risk for impaired skin integrity will decrease Outcome: Progressing

## 2024-04-06 NOTE — Progress Notes (Addendum)
 Nurse at bedside,patient alert and oriented to person,place,time and situation this am.However patient statesI did not rest at all last night. Patient c/o anxiety and being nauseated this am. Plan of care on going.

## 2024-04-06 NOTE — Progress Notes (Signed)
 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 Updates received. Patient Assessed. Created space and opportunity for patient  and family to explore thoughts and feelings regarding current medical situation.  Today's Discussion: Today before meeting with the patient/family, I reviewed the chart notes including recent note from yesterday, hospitalist note from today, nursing notes from today. I also reviewed vital signs, nursing flowsheets, medication administrations record, labs, and imaging. Labs reviewed include renal function panel which shows hypokalemia in the setting of previously normal potassium status post additional oral potassium ordered today.  BUN/creatinine continues to improve at 11/1.29 from hospitalization peak of 30/3.80 in the setting of AKI secondary to prerenal and bladder outlet obstruction now status post bilateral nephrostomy tubes.  CBC shows continued normalized white count at 7.9 in the setting of fungal UTI with peak 18.3 four days ago requiring ICU transfer for 1 to 2 days.  CBC also shows improved and stabilized hemoglobin at 10.0 today from 8.5 yesterday and 6.9 two days ago after PRBC in the setting of post procedure.  Today saw the patient at bedside, no family was present.  The patient tells me she had difficulty sleeping yesterday, only slept for maybe 30 minutes.  We talked about the difficulty sleeping in the  hospital.  She has received Xanax  this morning to hopefully help.  I offered to shut off lights and close the drapes when I leave to help her sleep.  We talked about how her kidney function is getting better and she is encouraged by this.  She is hopeful to be able to leave the hospital next 24 to 48 hours, based on what she is until by the hospitalist team.  We talked about possibly getting back in time for trunk or treat on Friday at her nursing facility.  She states that she participates she will do so from indoors and watch the children to avoid getting them sick or getting herself sick.  She does tell me about left ankle discomfort at the site with a apparent skin lesion/rash appearing space.  She states that medications did not really help, but she does not want anything stronger.  She states that the hospital doctor did look at it today.  This is been evaluated extensively as an outpatient with no recommendations.  Before leaving the room I dropped the drapes and closed her curtain and door as well as shut off the lights to allow her to rest.  I provided emotional and general support through therapeutic listening, empathy, sharing of stories, and other techniques. I answered all questions and addressed all concerns to the best of my ability.  Review of Systems  Respiratory:  Negative for chest tightness and shortness of breath.   Cardiovascular:  Negative for chest pain.  Gastrointestinal:  Negative for abdominal pain, nausea and vomiting.  Musculoskeletal:  Left ankle discomfort    Objective:   Primary Diagnoses: Present on Admission:  Acute kidney injury superimposed on stage 5 chronic kidney disease, not on chronic dialysis (HCC)  Metabolic acidosis  Urinary tract infection  Essential hypertension  Rheumatoid arthritis (HCC)  GERD (gastroesophageal reflux disease)   Vital Signs:  BP (!) 153/80 (BP Location: Right Arm)   Pulse 86   Temp (!) 97.3 F (36.3 C) (Oral)    Resp 16   Ht 5' 6 (1.676 m)   Wt 76.7 kg   SpO2 98%   BMI 27.29 kg/m   Physical Exam Vitals and nursing note reviewed.  Constitutional:      General: She is not in acute distress. HENT:     Head: Normocephalic and atraumatic.  Cardiovascular:     Rate and Rhythm: Normal rate.  Pulmonary:     Effort: Pulmonary effort is normal. No respiratory distress.  Abdominal:     General: Abdomen is flat.  Skin:    General: Skin is warm and dry.  Neurological:     General: No focal deficit present.     Mental Status: She is alert and oriented to person, place, and time.  Psychiatric:        Mood and Affect: Mood normal.        Behavior: Behavior normal.     Palliative Assessment/Data: 40%   Existing Vynca/ACP Documentation: MOST form signed 03/25/2021 Advance directive signed 07/26/2023  Assessment & Plan:   HPI/Patient Profile:  79 y.o. female  with past medical history of hypertension, rheumatoid arthritis, fibromyalgia, chronic pain, prior stroke, bladder outlet obstruction who presents to the emergency department via EMS due to abnormal lab.  She was admitted on 03/28/2024 with AKI on CKD 3, struct of uropathy, metabolic acidosis, urinalysis suspicious for UTI, bilateral pleural effusions, microcytic anemia, hypoalbuminemia, and others.    Palliative medicine was consulted for GOC conversations.  SUMMARY OF RECOMMENDATIONS   Full code Full scope of care Open to hemodialysis if needed/offered, though unlikely at this point Palliative medicine will back off at this time as goals are clear and anticipate discharge in 24 to 48 hours Please notify us  of any significant clinical change or new palliative needs  Symptom Management:  Per primary team Palliative medicine is available to assist as needed  Code Status: Full Code  Prognosis: Unable to determine  Discharge Planning: LTC SNF  Discussed with: Patient, family, HCPOA, medical team, nursing tea  Thank you for  allowing us  to participate in the care of Robin Blackburn PMT will continue to support holistically.  Billing based on MDM: Moderate  Detailed review of medical records (labs, imaging, vital signs), medically appropriate exam, discussed with treatment team, counseling and education to patient, family, & staff, documenting clinical information, medication management, coordination of care  Camellia Kays, NP Palliative Medicine Team  Team Phone # [phone] (Nights/Weekends)  02/05/2021, 8:17 AM

## 2024-04-06 NOTE — Progress Notes (Signed)
 PROGRESS NOTE    Robin Blackburn  FMW:[id number] DOB: 22-Oct-1945 DOA: 03/28/2024 PCP: Bertell Satterfield, MD   Brief Narrative:    Robin Blackburn is a 78 y.o. female with medical history significant of hypertension, rheumatoid arthritis, fibromyalgia, chronic pain, prior stroke, bladder outlet obstruction who presents to the emergency department via EMS due to abnormal lab.  Patient states that she was told that her kidney function was worsening, so she was sent to the ED for further evaluation and management.  She complained about nausea generalized weakness and abdominal pain but denies any discomfort with her urinary catheter.  Also denies fever, chills, cough, chest pain, shortness of breath, nausea, vomiting.  CT abdomen and pelvis without contrast showed stable bilateral hydroureteronephrosis showed concern for cystitis and urinary tract infection.  Bilateral pleural effusions, right greater than left, increased since prior exam. Foley catheter was changed, patient was started on IV meropenem , Zofran  was given due to nausea and IV hydration was provided.  Patient is admitted to the hospitalist service for further management evaluation of AKI, metabolic acidosis, UTI, pleural effusions.  Patient was noted to have some tachycardia as well as fever overnight and was shifted to the ICU for closer evaluation and was started on IV Merrem  as well as Diflucan given some of the findings in her urine cultures from bilateral nephrostomy tube placement that took place on 10/24.  Patient noted to have worsening anemia on 10/27 for which 1 unit PRBC will be given.  Patient now on micafungin for UTI until further sensitivities return.  Assessment & Plan:   Principal Problem:   Acute kidney injury superimposed on stage 5 chronic kidney disease, not on chronic dialysis (HCC) Active Problems:   Urinary tract infection   Essential hypertension   Rheumatoid arthritis (HCC)   GERD (gastroesophageal reflux  disease)   Metabolic acidosis   Bilateral pleural effusion   Obstructive uropathy   Macrocytic anemia   Hypoalbuminemia due to protein-calorie malnutrition  Assessment and Plan:   Acute kidney injury superimposed on stage III chronic kidney disease Obstructive uropathy status post bilateral percutaneous nephrostomy tube placement 10/24-improving Creatinine downward trending (baseline creatinine at 1.3-1.8) continue to monitor in a.m. IV hydration was provided in the ED. Will stop fluids, she eating fair. Renally adjust medications, avoid nephrotoxic agents/dehydration/hypotension Patient is agreeable to HD if needed, but appears to be a poor candidate per nephrology Foley catheter discontinued 10/24 Nephrology has signed off   Fungal UTI Fungal blood cultures pending, but routine blood cultures negative CT abdomen and pelvis without contrast was suggestive of UTI Urine culture done on 01/04/2024 was positive for Klebsiella pneumoniae (ESBL) which was only sensitive to imipenem and gentamicin. Placed on micafungin starting 10/27 after discussion with pharmacy until further sensitivities returned   Bilateral pleural effusion CT abdomen and pelvis without contrast showed bilateral pleural effusions, right greater than left, increased since prior exam. Right-sided thoracentesis done with removed. Follow fluid analysis.   Macrocytic anemia MCV 100.7, hemoglobin 8.9, hematocrit 29.4 Folate and vitamin B12 levels normal. Continue to monitor and transfuse as needed for hemoglobin less than 7 1 unit PRBC transfusion 10/27 Follow a.m. CBC   Hypoalbuminemia possibly secondary to moderate protein calorie malnutrition Albumin 2.7, protein supplement will be provided   Essential Hypertension Continue metoprolol  12.5 twice daily, increased Norvasc  to 5mg .   Rheumatoid arthritis Stable, Leflunomide  and adalimumab will be held in the setting of acute infection   GERD Continue  Protonix    DVT prophylaxis:  SCDs Code Status: Full Family Communication: HCPOA/friend at bedside 10/27 Disposition Plan:  Status is: Inpatient Remains inpatient appropriate because: Need for IV medications  Consultants:  Nephrology Discussed with Urology Dr. Sherrilee IR Palliative  Procedures:  None  Antimicrobials:  Anti-infectives (From admission, onward)    Start     Dose/Rate Route Frequency Ordered Stop   04/04/24 1100  micafungin (MYCAMINE) 100 mg in sodium chloride  0.9 % 100 mL IVPB        100 mg 105 mL/hr over 1 Hours Intravenous Every 24 hours 04/04/24 0921     04/02/24 0015  meropenem  (MERREM ) 500 mg in sodium chloride  0.9 % 100 mL IVPB  Status:  Discontinued        500 mg 200 mL/hr over 30 Minutes Intravenous Every 12 hours 04/01/24 2325 04/03/24 1158   04/01/24 2359  fluconazole (DIFLUCAN) IVPB 200 mg  Status:  Discontinued        200 mg 100 mL/hr over 60 Minutes Intravenous Every 24 hours 04/01/24 2259 04/04/24 0921   04/01/24 2345  fluconazole (DIFLUCAN) IVPB 400 mg  Status:  Discontinued        400 mg 100 mL/hr over 120 Minutes Intravenous Every 24 hours 04/01/24 2252 04/01/24 2259   04/01/24 1000  cefTRIAXone  (ROCEPHIN ) 2 g in sodium chloride  0.9 % 100 mL IVPB        2 g 200 mL/hr over 30 Minutes Intravenous To Radiology 03/31/24 1554 04/01/24 1025   03/28/24 2200  meropenem  (MERREM ) 1 g in sodium chloride  0.9 % 100 mL IVPB  Status:  Discontinued        1 g 200 mL/hr over 30 Minutes Intravenous Every 8 hours 03/28/24 2036 03/28/24 2036   03/28/24 2200  meropenem  (MERREM ) 1 g in sodium chloride  0.9 % 100 mL IVPB  Status:  Discontinued        1 g 200 mL/hr over 30 Minutes Intravenous Every 12 hours 03/28/24 2036 03/31/24 1243       Subjective: Patient noted to have done well overnight. No acute complaints or concerns noted.  Objective: Vitals:   04/05/24 2025 04/05/24 2133 04/06/24 0459 04/06/24 0806  BP: (!) 146/93 (!) 146/93 (!) 152/78 (!) 153/80   Pulse: 93 93 81 86  Resp:   16   Temp: 97.6 F (36.4 C)  (!) 97.4 F (36.3 C) (!) 97.3 F (36.3 C)  TempSrc: Oral  Oral Oral  SpO2: 97%  97% 98%  Weight:      Height:        Intake/Output Summary (Last 24 hours) at 04/06/2024 1229 Last data filed at 04/06/2024 0900 Gross per 24 hour  Intake 370 ml  Output 1620 ml  Net -1250 ml   Filed Weights   03/28/24 1440 04/01/24 2343 04/04/24 0614  Weight: 72 kg 82.7 kg 76.7 kg    Examination:  General exam: Appears calm and comfortable  Respiratory system: Clear to auscultation. Respiratory effort normal. Cardiovascular system: S1 & S2 heard, RRR.  Gastrointestinal system: Abdomen is soft Central nervous system: Alert and awake Extremities: No edema Skin: No significant lesions noted Psychiatry: Flat affect. Foley with clear, yellow urine output noted Nephrostomy tubes with clear, yellow urine output noted    Data Reviewed: I have personally reviewed following labs and imaging studies  CBC: Recent Labs  Lab 04/02/24 0030 04/02/24 0439 04/03/24 0518 04/04/24 0347 04/05/24 0400 04/06/24 0402  WBC 18.3* 12.8* 7.2 7.5 8.3 7.9  NEUTROABS 15.8*  --   --   --   --   --  HGB 8.7* 7.7* 7.5* 6.9* 8.5* 10.0*  HCT 27.4* 25.1* 23.6* 22.1* 26.9* 31.0*  MCV 97.2 98.4 98.7 98.2 93.7 93.7  PLT 400 318 324 309 309 353   Basic Metabolic Panel: Recent Labs  Lab 04/02/24 0439 04/03/24 0518 04/04/24 0347 04/05/24 0400 04/06/24 0402  NA 137 137 138  138 139 141  K 3.8 3.5 3.2*  3.2* 3.7 3.2*  CL 107 106 106  106 106 104  CO2 19* 21* 23  22 26 28   GLUCOSE 133* 96 83  85 91 101*  BUN 28* 24* 18  19 14 11   CREATININE 2.25* 1.86* 1.57*  1.57* 1.39* 1.29*  CALCIUM  7.9* 7.8* 7.4*  7.5* 7.6* 8.1*  MG 1.9 1.7 1.8 1.7 1.7  PHOS 3.8 2.8 2.6 2.5 2.4*   GFR: Estimated Creatinine Clearance: 37.6 mL/min (A) (by C-G formula based on SCr of 1.29 mg/dL (H)). Liver Function Tests: Recent Labs  Lab 04/02/24 0030 04/02/24 0439  04/03/24 0518 04/04/24 0347 04/05/24 0400 04/06/24 0402  AST 13*  --   --   --   --   --   ALT <5  --   --   --   --   --   ALKPHOS 113  --   --   --   --   --   BILITOT 0.3  --   --   --   --   --   PROT 6.0*  --   --   --   --   --   ALBUMIN 2.7* 2.5* 2.4* 2.3* 2.5* 2.7*   No results for input(s): LIPASE, AMYLASE in the last 168 hours.  No results for input(s): AMMONIA in the last 168 hours. Coagulation Profile: Recent Labs  Lab 04/01/24 0412  INR 1.8*   Cardiac Enzymes: No results for input(s): CKTOTAL, CKMB, CKMBINDEX, TROPONINI in the last 168 hours. BNP (last 3 results) No results for input(s): PROBNP in the last 8760 hours. HbA1C: No results for input(s): HGBA1C in the last 72 hours. CBG: Recent Labs  Lab 04/02/24 0426  GLUCAP 145*   Lipid Profile: No results for input(s): CHOL, HDL, LDLCALC, TRIG, CHOLHDL, LDLDIRECT in the last 72 hours. Thyroid  Function Tests: No results for input(s): TSH, T4TOTAL, FREET4, T3FREE, THYROIDAB in the last 72 hours. Anemia Panel: No results for input(s): VITAMINB12, FOLATE, FERRITIN, TIBC, IRON, RETICCTPCT in the last 72 hours.  Sepsis Labs: Recent Labs  Lab 04/02/24 0030 04/02/24 0439  LATICACIDVEN 0.8 0.6    Recent Results (from the past 240 hours)  Urine Culture     Status: None   Collection Time: 03/28/24  7:20 PM   Specimen: Urine, Random  Result Value Ref Range Status   Specimen Description   Final    URINE, RANDOM Performed at [facility], [address], [place], [place] [address]    Special Requests   Final    NONE Reflexed from [id number] Performed at [facility], [address], [place], [place] [address]    Culture   Final    NO GROWTH Performed at [facility], [address] [place], [place] [address]    Report Status 03/30/2024 FINAL  Final  Culture, body fluid w Gram Stain-bottle     Status: None   Collection Time: 03/30/24  9:05 AM    Specimen: Pleura  Result Value Ref Range Status   Specimen Description PLEURAL RIGHT  Final   Special Requests   Final    BOTTLES  DRAWN AEROBIC AND ANAEROBIC Blood Culture adequate volume   Culture   Final    NO GROWTH 5 DAYS Performed at [facility], [address], [place], [place] [address]    Report Status 04/04/2024 FINAL  Final  Gram stain     Status: None   Collection Time: 03/30/24  9:05 AM   Specimen: Pleura  Result Value Ref Range Status   Specimen Description PLEURAL  Final   Special Requests NONE  Final   Gram Stain   Final    CYTOSPIN SMEAR NO ORGANISMS SEEN WBC PRESENT,BOTH PMN AND MONONUCLEAR Performed at [facility], [address] [place], [place] [address]    Report Status 03/30/2024 FINAL  Final  Aerobic/Anaerobic Culture w Gram Stain (surgical/deep wound)     Status: None (Preliminary result)   Collection Time: 04/01/24 10:40 AM   Specimen: Kidney  Result Value Ref Range Status   Specimen Description KIDNEY  Final   Special Requests LEFT  Final   Gram Stain   Final    ABUNDANT WBC PRESENT, PREDOMINANTLY PMN MODERATE YEAST Performed at [facility], [address] [place], [place] [address]    Culture   Final    ABUNDANT CANDIDA GLABRATA Sent to [facility] for further susceptibility testing. NO ANAEROBES ISOLATED; CULTURE IN PROGRESS FOR 5 DAYS    Report Status PENDING  Incomplete  Body fluid culture w Gram Stain     Status: None   Collection Time: 04/01/24 11:06 AM   Specimen: Kidney; Body Fluid  Result Value Ref Range Status   Specimen Description KIDNEY  Final   Special Requests RIGHT RENAL  Final   Gram Stain   Final    FEW WBC PRESENT, PREDOMINANTLY PMN RARE BUDDING YEAST SEEN Performed at [facility], [address], [place], [place] [address]    Culture FEW JANIS DEEMS  Final   Report Status 04/04/2024 FINAL  Final  MRSA Next Gen by PCR, Nasal     Status: Abnormal   Collection Time: 04/01/24 11:01 PM   Specimen: Nasal  Mucosa; Nasal Swab  Result Value Ref Range Status   MRSA by PCR Next Gen DETECTED (A) NOT DETECTED Final    Comment: RESULT CALLED TO, READ BACK BY AND VERIFIED WITH: G ROOS AT 1248 ON [id number] BY S DALTON (NOTE) The GeneXpert MRSA Assay (FDA approved for NASAL specimens only), is one component of a comprehensive MRSA colonization surveillance program. It is not intended to diagnose MRSA infection nor to guide or monitor treatment for MRSA infections. Test performance is not FDA approved in patients less than 12 years old. Performed at [facility], [address], [place], [place] [address]   Culture, blood (Routine X 2) w Reflex to ID Panel     Status: None (Preliminary result)   Collection Time: 04/02/24 12:30 AM   Specimen: Right Antecubital; Blood  Result Value Ref Range Status   Specimen Description RIGHT ANTECUBITAL  Final   Special Requests   Final    BOTTLES DRAWN AEROBIC AND ANAEROBIC Blood Culture adequate volume   Culture   Final    NO GROWTH 4 DAYS Performed at [facility], [address], [place], [place] [address]    Report Status PENDING  Incomplete  Culture, blood (Routine X 2) w Reflex to ID Panel     Status: None (Preliminary result)   Collection Time: 04/02/24 12:40 AM   Specimen: BLOOD RIGHT HAND  Result Value Ref Range Status  Specimen Description BLOOD RIGHT HAND  Final   Special Requests   Final    BOTTLES DRAWN AEROBIC AND ANAEROBIC Blood Culture adequate volume   Culture   Final    NO GROWTH 4 DAYS Performed at [facility], [address] [place], [place] [address]    Report Status PENDING  Incomplete  Fungus culture, blood     Status: None (Preliminary result)   Collection Time: 04/02/24 11:39 AM   Specimen: BLOOD  Result Value Ref Range Status   Specimen Description BLOOD PICC LINE  Final   Special Requests   Final    BOTTLES DRAWN AEROBIC AND ANAEROBIC Blood Culture adequate volume Performed at [facility], [address], [place], [place]  [address]    Culture PENDING  Incomplete   Report Status PENDING  Incomplete         Radiology Studies: No results found.       Scheduled Meds:  amLODipine   5 mg Oral Daily   Chlorhexidine  Gluconate Cloth  6 each Topical Q0600   cycloSPORINE   1 drop Both Eyes BID   feeding supplement  237 mL Oral BID BM   folic acid   1 mg Oral Daily   metoprolol  tartrate  12.5 mg Oral BID   mupirocin ointment   Nasal BID   pantoprazole   40 mg Oral Daily   sodium bicarbonate  650 mg Oral TID   sodium chloride  flush  5 mL Intracatheter Q8H   ticagrelor   60 mg Oral Daily   traZODone   100 mg Oral QHS   vitamin B-12  250 mcg Oral Daily     LOS: 9 days    Time spent: 55 minutes    Anouk Critzer JONETTA Fairly, DO [facility]  If 7PM-7AM, please contact night-coverage [email or web address] 04/06/2024, 12:29 PM

## 2024-04-06 NOTE — Plan of Care (Signed)
   Problem: Education: Goal: Knowledge of General Education information will improve Description Including pain rating scale, medication(s)/side effects and non-pharmacologic comfort measures Outcome: Progressing   Problem: Education: Goal: Knowledge of General Education information will improve Description Including pain rating scale, medication(s)/side effects and non-pharmacologic comfort measures Outcome: Progressing

## 2024-04-07 ENCOUNTER — Ambulatory Visit: Admitting: Adult Health

## 2024-04-07 DIAGNOSIS — N179 Acute kidney failure, unspecified: Secondary | ICD-10-CM | POA: Diagnosis not present

## 2024-04-07 DIAGNOSIS — N185 Chronic kidney disease, stage 5: Secondary | ICD-10-CM | POA: Diagnosis not present

## 2024-04-07 LAB — RENAL FUNCTION PANEL
Albumin: 2.7 g/dL — ABNORMAL LOW (ref 3.5–5.0)
Anion gap: 9 (ref 5–15)
BUN: 10 mg/dL (ref 8–23)
CO2: 27 mmol/L (ref 22–32)
Calcium: 8 mg/dL — ABNORMAL LOW (ref 8.9–10.3)
Chloride: 103 mmol/L (ref 98–111)
Creatinine, Ser: 1.24 mg/dL — ABNORMAL HIGH (ref 0.44–1.00)
GFR, Estimated: 44 mL/min — ABNORMAL LOW (ref >60)
Glucose, Bld: 101 mg/dL — ABNORMAL HIGH (ref 70–99)
Phosphorus: 2.6 mg/dL (ref 2.5–4.6)
Potassium: 3.5 mmol/L (ref 3.5–5.1)
Sodium: 138 mmol/L (ref 135–145)

## 2024-04-07 LAB — CULTURE, BLOOD (ROUTINE X 2)
Culture: NO GROWTH
Culture: NO GROWTH
Special Requests: ADEQUATE
Special Requests: ADEQUATE

## 2024-04-07 LAB — CBC
HCT: 30.2 % — ABNORMAL LOW (ref 36.0–46.0)
Hemoglobin: 9.6 g/dL — ABNORMAL LOW (ref 12.0–15.0)
MCH: 30.3 pg (ref 26.0–34.0)
MCHC: 31.8 g/dL (ref 30.0–36.0)
MCV: 95.3 fL (ref 80.0–100.0)
Platelets: 371 K/uL (ref 150–400)
RBC: 3.17 MIL/uL — ABNORMAL LOW (ref 3.87–5.11)
RDW: 16.9 % — ABNORMAL HIGH (ref 11.5–15.5)
WBC: 6.8 K/uL (ref 4.0–10.5)
nRBC: 0 % (ref 0.0–0.2)

## 2024-04-07 LAB — MAGNESIUM: Magnesium: 1.7 mg/dL (ref 1.7–2.4)

## 2024-04-07 MED ORDER — ZINC OXIDE 40 % EX OINT
TOPICAL_OINTMENT | Freq: Every day | CUTANEOUS | Status: DC
  Filled 2024-04-07: qty 57

## 2024-04-07 NOTE — Plan of Care (Signed)
  Problem: Health Behavior/Discharge Planning: Goal: Ability to manage health-related needs will improve Outcome: Progressing   Problem: Clinical Measurements: Goal: Will remain free from infection Outcome: Progressing Goal: Diagnostic test results will improve Outcome: Progressing Goal: Respiratory complications will improve Outcome: Progressing Goal: Cardiovascular complication will be avoided Outcome: Progressing   Problem: Activity: Goal: Risk for activity intolerance will decrease Outcome: Progressing   Problem: Coping: Goal: Level of anxiety will decrease Outcome: Progressing   Problem: Elimination: Goal: Will not experience complications related to bowel motility Outcome: Progressing Goal: Will not experience complications related to urinary retention Outcome: Progressing   Problem: Pain Managment: Goal: General experience of comfort will improve and/or be controlled Outcome: Progressing   Problem: Safety: Goal: Ability to remain free from injury will improve Outcome: Progressing   Problem: Skin Integrity: Goal: Risk for impaired skin integrity will decrease Outcome: Progressing   Problem: Education: Goal: Knowledge of disease and its progression will improve Outcome: Progressing

## 2024-04-07 NOTE — Progress Notes (Addendum)
 Pt complained of left leg and foot plain after receiving prn pan meds. Pt requested heat packs to be placed on arch of left foot and underneath left knee area for relief until next scheduled prn pain med dose. Pt tolerated heat packs well, no skin irritation or skin break down was visualized. Pt stated these type of episodes of pain happens often, she utilizing over the counter creams for relief. Will continue to monitor pt for further signs of discomfort.  This pt also experienced three episodes of loose stools, gave prn Imodium , no further issues with loose stools expressed.

## 2024-04-07 NOTE — Progress Notes (Signed)
 PROGRESS NOTE    Robin Blackburn  FMW:[id number] DOB: 1945/06/16 DOA: 03/28/2024 PCP: Bertell Satterfield, MD   Brief Narrative:    Robin Blackburn is a 78 y.o. female with medical history significant of hypertension, rheumatoid arthritis, fibromyalgia, chronic pain, prior stroke, bladder outlet obstruction who presents to the emergency department via EMS due to abnormal lab.  Patient states that she was told that her kidney function was worsening, so she was sent to the ED for further evaluation and management.  She complained about nausea generalized weakness and abdominal pain but denies any discomfort with her urinary catheter.  Also denies fever, chills, cough, chest pain, shortness of breath, nausea, vomiting.  CT abdomen and pelvis without contrast showed stable bilateral hydroureteronephrosis showed concern for cystitis and urinary tract infection.  Bilateral pleural effusions, right greater than left, increased since prior exam. Foley catheter was changed, patient was started on IV meropenem , Zofran  was given due to nausea and IV hydration was provided.  Patient is admitted to the hospitalist service for further management evaluation of AKI, metabolic acidosis, UTI, pleural effusions.  Patient was noted to have some tachycardia as well as fever overnight and was shifted to the ICU for closer evaluation and was started on IV Merrem  as well as Diflucan given some of the findings in her urine cultures from bilateral nephrostomy tube placement that took place on 10/24.  Patient noted to have worsening anemia on 10/27 for which 1 unit PRBC will be given.  Patient now on micafungin for UTI until further sensitivities return.  Discussed case with ID 10/30 with recommendations to follow sensitivities and maintain on micafungin for now and hopefully can discharge on Diflucan.  Patient will require total 14-day course of treatment.  Assessment & Plan:   Principal Problem:   Acute kidney injury superimposed  on stage 5 chronic kidney disease, not on chronic dialysis (HCC) Active Problems:   Urinary tract infection   Essential hypertension   Rheumatoid arthritis (HCC)   GERD (gastroesophageal reflux disease)   Metabolic acidosis   Bilateral pleural effusion   Obstructive uropathy   Macrocytic anemia   Hypoalbuminemia due to protein-calorie malnutrition  Assessment and Plan:   Acute kidney injury superimposed on stage III chronic kidney disease Obstructive uropathy status post bilateral percutaneous nephrostomy tube placement 10/24-improving Creatinine downward trending (baseline creatinine at 1.3-1.8) continue to monitor in a.m. IV hydration was provided in the ED. Will stop fluids, she eating fair. Renally adjust medications, avoid nephrotoxic agents/dehydration/hypotension Patient is agreeable to HD if needed, but appears to be a poor candidate per nephrology Foley catheter discontinued 10/24 Nephrology has signed off   Candida UTI Fungal blood cultures pending, but routine blood cultures negative CT abdomen and pelvis without contrast was suggestive of UTI Urine culture done on 01/04/2024 was positive for Klebsiella pneumoniae (ESBL) which was only sensitive to imipenem and gentamicin. Placed on micafungin starting 10/27 after discussion with pharmacy until further sensitivities returned Discussed case with ID on 10/30 with recommendations to maintain on micafungin for now and follow sensitivities.  Hopefully can discharge on Diflucan and will need total 14-day course of treatment from time of nephrostomy tube placement which was on 10/24, EOT 11/7.   Bilateral pleural effusion CT abdomen and pelvis without contrast showed bilateral pleural effusions, right greater than left, increased since prior exam. Right-sided thoracentesis done with removed. - Fluid analysis insignificant and transudative   Macrocytic anemia MCV 100.7, hemoglobin 8.9, hematocrit 29.4 Folate and vitamin  B12  levels normal. Continue to monitor and transfuse as needed for hemoglobin less than 7 1 unit PRBC transfusion 10/27 Follow a.m. CBC   Hypoalbuminemia possibly secondary to moderate protein calorie malnutrition Albumin 2.7, protein supplement will be provided   Essential Hypertension Continue metoprolol  12.5 twice daily, increased Norvasc  to 5mg .   Rheumatoid arthritis Stable, Leflunomide  and adalimumab will be held in the setting of acute infection   GERD Continue Protonix    DVT prophylaxis: SCDs Code Status: Full Family Communication: HCPOA/friend at bedside 10/27, tried calling 10/30 with no response Disposition Plan:  Status is: Inpatient Remains inpatient appropriate because: Need for IV medications  Consultants:  Nephrology Discussed with Urology Dr. Sherrilee IR Palliative Discussed case with ID 10/30  Procedures:  None  Antimicrobials:  Anti-infectives (From admission, onward)    Start     Dose/Rate Route Frequency Ordered Stop   04/04/24 1100  micafungin (MYCAMINE) 100 mg in sodium chloride  0.9 % 100 mL IVPB        100 mg 105 mL/hr over 1 Hours Intravenous Every 24 hours 04/04/24 0921     04/02/24 0015  meropenem  (MERREM ) 500 mg in sodium chloride  0.9 % 100 mL IVPB  Status:  Discontinued        500 mg 200 mL/hr over 30 Minutes Intravenous Every 12 hours 04/01/24 2325 04/03/24 1158   04/01/24 2359  fluconazole (DIFLUCAN) IVPB 200 mg  Status:  Discontinued        200 mg 100 mL/hr over 60 Minutes Intravenous Every 24 hours 04/01/24 2259 04/04/24 0921   04/01/24 2345  fluconazole (DIFLUCAN) IVPB 400 mg  Status:  Discontinued        400 mg 100 mL/hr over 120 Minutes Intravenous Every 24 hours 04/01/24 2252 04/01/24 2259   04/01/24 1000  cefTRIAXone  (ROCEPHIN ) 2 g in sodium chloride  0.9 % 100 mL IVPB        2 g 200 mL/hr over 30 Minutes Intravenous To Radiology 03/31/24 1554 04/01/24 1025   03/28/24 2200  meropenem  (MERREM ) 1 g in sodium chloride  0.9 % 100 mL  IVPB  Status:  Discontinued        1 g 200 mL/hr over 30 Minutes Intravenous Every 8 hours 03/28/24 2036 03/28/24 2036   03/28/24 2200  meropenem  (MERREM ) 1 g in sodium chloride  0.9 % 100 mL IVPB  Status:  Discontinued        1 g 200 mL/hr over 30 Minutes Intravenous Every 12 hours 03/28/24 2036 03/31/24 1243       Subjective: Patient noted to have done well overnight. No acute complaints or concerns noted.  Objective: Vitals:   04/06/24 2026 04/06/24 2026 04/06/24 2106 04/07/24 0246  BP: (!) 144/80 (!) 144/80 (!) 144/80 123/89  Pulse: 88 88 88 95  Resp:  18  20  Temp: (!) 97.5 F (36.4 C) (!) 97.5 F (36.4 C)  97.6 F (36.4 C)  TempSrc: Oral Oral  Oral  SpO2: 98% 98%  98%  Weight:      Height:        Intake/Output Summary (Last 24 hours) at 04/07/2024 1255 Last data filed at 04/07/2024 0956 Gross per 24 hour  Intake 994.87 ml  Output 1670 ml  Net -675.13 ml   Filed Weights   03/28/24 1440 04/01/24 2343 04/04/24 0614  Weight: 72 kg 82.7 kg 76.7 kg    Examination:  General exam: Appears calm and comfortable  Respiratory system: Clear to auscultation. Respiratory effort normal. Cardiovascular system: S1 & S2  heard, RRR.  Gastrointestinal system: Abdomen is soft Central nervous system: Alert and awake Extremities: No edema Skin: No significant lesions noted Psychiatry: Flat affect. Foley with clear, yellow urine output noted Nephrostomy tubes with clear, yellow urine output noted    Data Reviewed: I have personally reviewed following labs and imaging studies  CBC: Recent Labs  Lab 04/02/24 0030 04/02/24 0439 04/03/24 0518 04/04/24 0347 04/05/24 0400 04/06/24 0402 04/07/24 0355  WBC 18.3*   < > 7.2 7.5 8.3 7.9 6.8  NEUTROABS 15.8*  --   --   --   --   --   --   HGB 8.7*   < > 7.5* 6.9* 8.5* 10.0* 9.6*  HCT 27.4*   < > 23.6* 22.1* 26.9* 31.0* 30.2*  MCV 97.2   < > 98.7 98.2 93.7 93.7 95.3  PLT 400   < > 324 309 309 353 371   < > = values in this  interval not displayed.   Basic Metabolic Panel: Recent Labs  Lab 04/03/24 0518 04/04/24 0347 04/05/24 0400 04/06/24 0402 04/07/24 0355  NA 137 138  138 139 141 138  K 3.5 3.2*  3.2* 3.7 3.2* 3.5  CL 106 106  106 106 104 103  CO2 21* 23  22 26 28 27   GLUCOSE 96 83  85 91 101* 101*  BUN 24* 18  19 14 11 10   CREATININE 1.86* 1.57*  1.57* 1.39* 1.29* 1.24*  CALCIUM  7.8* 7.4*  7.5* 7.6* 8.1* 8.0*  MG 1.7 1.8 1.7 1.7 1.7  PHOS 2.8 2.6 2.5 2.4* 2.6   GFR: Estimated Creatinine Clearance: 39.1 mL/min (A) (by C-G formula based on SCr of 1.24 mg/dL (H)). Liver Function Tests: Recent Labs  Lab 04/02/24 0030 04/02/24 0439 04/03/24 0518 04/04/24 0347 04/05/24 0400 04/06/24 0402 04/07/24 0355  AST 13*  --   --   --   --   --   --   ALT <5  --   --   --   --   --   --   ALKPHOS 113  --   --   --   --   --   --   BILITOT 0.3  --   --   --   --   --   --   PROT 6.0*  --   --   --   --   --   --   ALBUMIN 2.7*   < > 2.4* 2.3* 2.5* 2.7* 2.7*   < > = values in this interval not displayed.   No results for input(s): LIPASE, AMYLASE in the last 168 hours.  No results for input(s): AMMONIA in the last 168 hours. Coagulation Profile: Recent Labs  Lab 04/01/24 0412  INR 1.8*   Cardiac Enzymes: No results for input(s): CKTOTAL, CKMB, CKMBINDEX, TROPONINI in the last 168 hours. BNP (last 3 results) No results for input(s): PROBNP in the last 8760 hours. HbA1C: No results for input(s): HGBA1C in the last 72 hours. CBG: Recent Labs  Lab 04/02/24 0426  GLUCAP 145*   Lipid Profile: No results for input(s): CHOL, HDL, LDLCALC, TRIG, CHOLHDL, LDLDIRECT in the last 72 hours. Thyroid  Function Tests: No results for input(s): TSH, T4TOTAL, FREET4, T3FREE, THYROIDAB in the last 72 hours. Anemia Panel: No results for input(s): VITAMINB12, FOLATE, FERRITIN, TIBC, IRON, RETICCTPCT in the last 72 hours.  Sepsis Labs: Recent Labs   Lab 04/02/24 0030 04/02/24 0439  LATICACIDVEN 0.8 0.6    Recent  Results (from the past 240 hours)  Urine Culture     Status: None   Collection Time: 03/28/24  7:20 PM   Specimen: Urine, Random  Result Value Ref Range Status   Specimen Description   Final    URINE, RANDOM Performed at [facility], [address], [place], [place] [address]    Special Requests   Final    NONE Reflexed from [id number] Performed at [facility], [address] [place], [place] [address]    Culture   Final    NO GROWTH Performed at [facility], [address] [place], [place] [address]    Report Status 03/30/2024 FINAL  Final  Culture, body fluid w Gram Stain-bottle     Status: None   Collection Time: 03/30/24  9:05 AM   Specimen: Pleura  Result Value Ref Range Status   Specimen Description PLEURAL RIGHT  Final   Special Requests   Final    BOTTLES DRAWN AEROBIC AND ANAEROBIC Blood Culture adequate volume   Culture   Final    NO GROWTH 5 DAYS Performed at [facility], [address], [place], [place] [address]    Report Status 04/04/2024 FINAL  Final  Gram stain     Status: None   Collection Time: 03/30/24  9:05 AM   Specimen: Pleura  Result Value Ref Range Status   Specimen Description PLEURAL  Final   Special Requests NONE  Final   Gram Stain   Final    CYTOSPIN SMEAR NO ORGANISMS SEEN WBC PRESENT,BOTH PMN AND MONONUCLEAR Performed at [facility], [address] [place], [place] [address]    Report Status 03/30/2024 FINAL  Final  Aerobic/Anaerobic Culture w Gram Stain (surgical/deep wound)     Status: None (Preliminary result)   Collection Time: 04/01/24 10:40 AM   Specimen: Kidney  Result Value Ref Range Status   Specimen Description KIDNEY  Final   Special Requests LEFT  Final   Gram Stain   Final    ABUNDANT WBC PRESENT, PREDOMINANTLY PMN MODERATE YEAST    Culture   Final    ABUNDANT CANDIDA GLABRATA Sent to [facility] for further susceptibility testing. NO ANAEROBES  ISOLATED Performed at [facility], [address] [place], [place] [address]    Report Status PENDING  Incomplete  Body fluid culture w Gram Stain     Status: None   Collection Time: 04/01/24 11:06 AM   Specimen: Kidney; Body Fluid  Result Value Ref Range Status   Specimen Description KIDNEY  Final   Special Requests RIGHT RENAL  Final   Gram Stain   Final    FEW WBC PRESENT, PREDOMINANTLY PMN RARE BUDDING YEAST SEEN Performed at [facility], [address] [place], [place] [address]    Culture FEW JANIS DEEMS  Final   Report Status 04/04/2024 FINAL  Final  MRSA Next Gen by PCR, Nasal     Status: Abnormal   Collection Time: 04/01/24 11:01 PM   Specimen: Nasal Mucosa; Nasal Swab  Result Value Ref Range Status   MRSA by PCR Next Gen DETECTED (A) NOT DETECTED Final    Comment: RESULT CALLED TO, READ BACK BY AND VERIFIED WITH: G ROOS AT 1248 ON [id number] BY S DALTON (NOTE) The GeneXpert MRSA Assay (FDA approved for NASAL specimens only), is one component of a comprehensive MRSA colonization surveillance program. It is not intended to diagnose MRSA infection nor to guide or monitor treatment for MRSA infections. Test  performance is not FDA approved in patients less than 24 years old. Performed at [facility], [address], [place], [place] [address]   Culture, blood (Routine X 2) w Reflex to ID Panel     Status: None   Collection Time: 04/02/24 12:30 AM   Specimen: Right Antecubital; Blood  Result Value Ref Range Status   Specimen Description RIGHT ANTECUBITAL  Final   Special Requests   Final    BOTTLES DRAWN AEROBIC AND ANAEROBIC Blood Culture adequate volume   Culture   Final    NO GROWTH 5 DAYS Performed at [facility], [address] [place], [place] [address]    Report Status 04/07/2024 FINAL  Final  Culture, blood (Routine X 2) w Reflex to ID Panel     Status: None   Collection Time: 04/02/24 12:40 AM   Specimen: BLOOD RIGHT HAND  Result Value  Ref Range Status   Specimen Description BLOOD RIGHT HAND  Final   Special Requests   Final    BOTTLES DRAWN AEROBIC AND ANAEROBIC Blood Culture adequate volume   Culture   Final    NO GROWTH 5 DAYS Performed at [facility], [address] [place], [place] [address]    Report Status 04/07/2024 FINAL  Final  Fungus culture, blood     Status: None (Preliminary result)   Collection Time: 04/02/24 11:39 AM   Specimen: BLOOD  Result Value Ref Range Status   Specimen Description BLOOD PICC LINE  Final   Special Requests   Final    BOTTLES DRAWN AEROBIC AND ANAEROBIC Blood Culture adequate volume Performed at [facility], [address] [place], [place] [address]    Culture PENDING  Incomplete   Report Status PENDING  Incomplete         Radiology Studies: No results found.       Scheduled Meds:  amLODipine   5 mg Oral Daily   Chlorhexidine  Gluconate Cloth  6 each Topical Q0600   cycloSPORINE   1 drop Both Eyes BID   feeding supplement  237 mL Oral BID BM   folic acid   1 mg Oral Daily   liver oil-zinc oxide   Topical Q0600   metoprolol  tartrate  12.5 mg Oral BID   mupirocin ointment   Nasal BID   pantoprazole   40 mg Oral Daily   sodium bicarbonate  650 mg Oral TID   sodium chloride  flush  5 mL Intracatheter Q8H   ticagrelor   60 mg Oral Daily   traZODone   100 mg Oral QHS   vitamin B-12  250 mcg Oral Daily     LOS: 10 days    Time spent: 55 minutes    Lizmarie Witters JONETTA Fairly, DO [facility]  If 7PM-7AM, please contact night-coverage [email or web address] 04/07/2024, 12:55 PM

## 2024-04-08 DIAGNOSIS — N185 Chronic kidney disease, stage 5: Secondary | ICD-10-CM | POA: Diagnosis not present

## 2024-04-08 DIAGNOSIS — N179 Acute kidney failure, unspecified: Secondary | ICD-10-CM | POA: Diagnosis not present

## 2024-04-08 LAB — BASIC METABOLIC PANEL WITH GFR
Anion gap: 9 (ref 5–15)
BUN: 9 mg/dL (ref 8–23)
CO2: 28 mmol/L (ref 22–32)
Calcium: 8.2 mg/dL — ABNORMAL LOW (ref 8.9–10.3)
Chloride: 102 mmol/L (ref 98–111)
Creatinine, Ser: 1.38 mg/dL — ABNORMAL HIGH (ref 0.44–1.00)
GFR, Estimated: 39 mL/min — ABNORMAL LOW (ref >60)
Glucose, Bld: 101 mg/dL — ABNORMAL HIGH (ref 70–99)
Potassium: 3.7 mmol/L (ref 3.5–5.1)
Sodium: 139 mmol/L (ref 135–145)

## 2024-04-08 LAB — CBC
HCT: 30.7 % — ABNORMAL LOW (ref 36.0–46.0)
Hemoglobin: 9.5 g/dL — ABNORMAL LOW (ref 12.0–15.0)
MCH: 29.1 pg (ref 26.0–34.0)
MCHC: 30.9 g/dL (ref 30.0–36.0)
MCV: 93.9 fL (ref 80.0–100.0)
Platelets: 395 K/uL (ref 150–400)
RBC: 3.27 MIL/uL — ABNORMAL LOW (ref 3.87–5.11)
RDW: 16.6 % — ABNORMAL HIGH (ref 11.5–15.5)
WBC: 7.2 K/uL (ref 4.0–10.5)
nRBC: 0 % (ref 0.0–0.2)

## 2024-04-08 LAB — MAGNESIUM: Magnesium: 1.7 mg/dL (ref 1.7–2.4)

## 2024-04-08 MED ORDER — MICAFUNGIN SODIUM 50 MG IV SOLR: 100.0000 mg | Freq: Every day | INTRAVENOUS | 0 refills | Status: AC

## 2024-04-08 NOTE — Plan of Care (Signed)

## 2024-04-08 NOTE — Progress Notes (Signed)
 PROGRESS NOTE    Robin Blackburn  FMW:[id number] DOB: 10-02-1945 DOA: 03/28/2024 PCP: Bertell Satterfield, MD  Subjective: The patient was seen and examined this morning, sleeping in acute distress, hemodynamically stable No issues overnight    Brief Narrative:    Robin Blackburn is a 78 y.o. female with medical history significant of hypertension, rheumatoid arthritis, fibromyalgia, chronic pain, prior stroke, bladder outlet obstruction who presents to the emergency department via EMS due to abnormal lab.  Patient states that she was told that her kidney function was worsening, so she was sent to the ED for further evaluation and management.  She complained about nausea generalized weakness and abdominal pain but denies any discomfort with her urinary catheter.  Also denies fever, chills, cough, chest pain, shortness of breath, nausea, vomiting.  CT abdomen and pelvis without contrast showed stable bilateral hydroureteronephrosis showed concern for cystitis and urinary tract infection.  Bilateral pleural effusions, right greater than left, increased since prior exam. Foley catheter was changed, patient was started on IV meropenem , Zofran  was given due to nausea and IV hydration was provided.  Patient is admitted to the hospitalist service for further management evaluation of AKI, metabolic acidosis, UTI, pleural effusions.  Patient was noted to have some tachycardia as well as fever overnight and was shifted to the ICU for closer evaluation and was started on IV Merrem  as well as Diflucan given some of the findings in her urine cultures from bilateral nephrostomy tube placement that took place on 10/24.  Patient noted to have worsening anemia on 10/27 for which 1 unit PRBC will be given.  Patient now on micafungin for UTI until further sensitivities return.  Assessment & Plan:   Principal Problem:   Acute kidney injury superimposed on stage 5 chronic kidney disease, not on chronic dialysis  (HCC) Active Problems:   Urinary tract infection   Essential hypertension   Rheumatoid arthritis (HCC)   GERD (gastroesophageal reflux disease)   Metabolic acidosis   Bilateral pleural effusion   Obstructive uropathy   Macrocytic anemia   Hypoalbuminemia due to protein-calorie malnutrition  Assessment and Plan:   Acute kidney injury superimposed on stage III chronic kidney disease Obstructive uropathy status post bilateral percutaneous nephrostomy tube placement 10/24-improving Continue to monitor Lab Results  Component Value Date   CREATININE 1.38 (H) 04/08/2024   CREATININE 1.24 (H) 04/07/2024   CREATININE 1.29 (H) 04/06/2024    IV hydration was provided in the ED. Will stop fluids, she eating fair. Renally adjust medications, avoid nephrotoxic agents/dehydration/hypotension Patient is agreeable to HD if needed, but appears to be a poor candidate per nephrology Foley catheter discontinued 10/24 Nephrology has signed off Hemodynamically stable   Fungal UTI Fungal blood cultures pending, but routine blood cultures negative CT abdomen and pelvis without contrast was suggestive of UTI Urine culture done on 01/04/2024 was positive for Klebsiella pneumoniae (ESBL) which was only sensitive to imipenem and gentamicin. Placed on Micafungin starting 10/27 after discussion with pharmacy until further sensitivities returned (Has been discussed with ID, to treat until 11/ 7)   Bilateral pleural effusion CT abdomen and pelvis without contrast showed bilateral pleural effusions, right greater than left, increased since prior exam. Right-sided thoracentesis done with removed.  Fluid cloudy-straw neutrophil 26, LD 155, total protein < 3-appears transudate   Macrocytic anemia MCV 100.7, hemoglobin 8.9, hematocrit 29.4 Folate and vitamin B12 levels normal. Continue to monitor and transfuse as needed for hemoglobin less than 7 1 unit PRBC transfusion 10/27 Hemoglobin  6.9>>>>>   9.5   Hypoalbuminemia possibly secondary to moderate protein calorie malnutrition Albumin 2.7, protein supplement will be provided   Essential Hypertension Continue metoprolol  12.5 twice daily, increased Norvasc  to 5mg .   Rheumatoid arthritis Stable, Leflunomide  and adalimumab will be held in the setting of acute infection   GERD Continue Protonix    DVT prophylaxis: SCDs Code Status: Full Family Communication: HCPOA/friend at bedside 10/27 Disposition Plan:  Status is: Inpatient Remains inpatient appropriate because: Need for IV medications  Consultants:  Nephrology Discussed with Urology Dr. Sherrilee IR Palliative  Procedures:  None  Antimicrobials:  Anti-infectives (From admission, onward)    Start     Dose/Rate Route Frequency Ordered Stop   04/04/24 1100  micafungin (MYCAMINE) 100 mg in sodium chloride  0.9 % 100 mL IVPB        100 mg 105 mL/hr over 1 Hours Intravenous Every 24 hours 04/04/24 0921 04/18/24 2359   04/02/24 0015  meropenem  (MERREM ) 500 mg in sodium chloride  0.9 % 100 mL IVPB  Status:  Discontinued        500 mg 200 mL/hr over 30 Minutes Intravenous Every 12 hours 04/01/24 2325 04/03/24 1158   04/01/24 2359  fluconazole (DIFLUCAN) IVPB 200 mg  Status:  Discontinued        200 mg 100 mL/hr over 60 Minutes Intravenous Every 24 hours 04/01/24 2259 04/04/24 0921   04/01/24 2345  fluconazole (DIFLUCAN) IVPB 400 mg  Status:  Discontinued        400 mg 100 mL/hr over 120 Minutes Intravenous Every 24 hours 04/01/24 2252 04/01/24 2259   04/01/24 1000  cefTRIAXone  (ROCEPHIN ) 2 g in sodium chloride  0.9 % 100 mL IVPB        2 g 200 mL/hr over 30 Minutes Intravenous To Radiology 03/31/24 1554 04/01/24 1025   03/28/24 2200  meropenem  (MERREM ) 1 g in sodium chloride  0.9 % 100 mL IVPB  Status:  Discontinued        1 g 200 mL/hr over 30 Minutes Intravenous Every 8 hours 03/28/24 2036 03/28/24 2036   03/28/24 2200  meropenem  (MERREM ) 1 g in sodium chloride  0.9 % 100  mL IVPB  Status:  Discontinued        1 g 200 mL/hr over 30 Minutes Intravenous Every 12 hours 03/28/24 2036 03/31/24 1243         Objective: Vitals:   04/07/24 0246 04/07/24 1333 04/07/24 1946 04/08/24 0403  BP: 123/89 136/74 (!) 146/89 138/64  Pulse: 95 90 94 79  Resp: 20  18 17   Temp: 97.6 F (36.4 C) 98.1 F (36.7 C) 97.8 F (36.6 C) 98.1 F (36.7 C)  TempSrc: Oral Oral    SpO2: 98% 96% 96% 95%  Weight:      Height:        Intake/Output Summary (Last 24 hours) at 04/08/2024 1025 Last data filed at 04/08/2024 0900 Gross per 24 hour  Intake 640 ml  Output 1625 ml  Net -985 ml   Filed Weights   03/28/24 1440 04/01/24 2343 04/04/24 0614  Weight: 72 kg 82.7 kg 76.7 kg    Examination: General:   cooperative, no distress;   HEENT:  Normocephalic, PERRL, otherwise with in Normal limits   Neuro:  CNII-XII intact. , normal motor and sensation, reflexes intact   Lungs:   Clear to auscultation BL, Respirations unlabored,  No wheezes / crackles  Cardio:    S1/S2, RRR, No murmure, No Rubs or Gallops   Abdomen:  Soft,  non-tender, bowel sounds active all four quadrants, no guarding or peritoneal signs.  Muscular  skeletal:  Limited exam -global generalized weaknesses - in bed, able to move all 4 extremities,   2+ pulses,  symmetric, No pitting edema  Skin:  Dry, warm to touch, negative for any Rashes,  Wounds: Bilateral nephrostomy tubes-urine clear         Data Reviewed: I have personally reviewed following labs and imaging studies  CBC: Recent Labs  Lab 04/02/24 0030 04/02/24 0439 04/04/24 0347 04/05/24 0400 04/06/24 0402 04/07/24 0355 04/08/24 0400  WBC 18.3*   < > 7.5 8.3 7.9 6.8 7.2  NEUTROABS 15.8*  --   --   --   --   --   --   HGB 8.7*   < > 6.9* 8.5* 10.0* 9.6* 9.5*  HCT 27.4*   < > 22.1* 26.9* 31.0* 30.2* 30.7*  MCV 97.2   < > 98.2 93.7 93.7 95.3 93.9  PLT 400   < > 309 309 353 371 395   < > = values in this interval not displayed.   Basic  Metabolic Panel: Recent Labs  Lab 04/03/24 0518 04/04/24 0347 04/05/24 0400 04/06/24 0402 04/07/24 0355 04/08/24 0400  NA 137 138  138 139 141 138 139  K 3.5 3.2*  3.2* 3.7 3.2* 3.5 3.7  CL 106 106  106 106 104 103 102  CO2 21* 23  22 26 28 27 28   GLUCOSE 96 83  85 91 101* 101* 101*  BUN 24* 18  19 14 11 10 9   CREATININE 1.86* 1.57*  1.57* 1.39* 1.29* 1.24* 1.38*  CALCIUM  7.8* 7.4*  7.5* 7.6* 8.1* 8.0* 8.2*  MG 1.7 1.8 1.7 1.7 1.7 1.7  PHOS 2.8 2.6 2.5 2.4* 2.6  --    GFR: Estimated Creatinine Clearance: 35.2 mL/min (A) (by C-G formula based on SCr of 1.38 mg/dL (H)). Liver Function Tests: Recent Labs  Lab 04/02/24 0030 04/02/24 0439 04/03/24 0518 04/04/24 0347 04/05/24 0400 04/06/24 0402 04/07/24 0355  AST 13*  --   --   --   --   --   --   ALT <5  --   --   --   --   --   --   ALKPHOS 113  --   --   --   --   --   --   BILITOT 0.3  --   --   --   --   --   --   PROT 6.0*  --   --   --   --   --   --   ALBUMIN 2.7*   < > 2.4* 2.3* 2.5* 2.7* 2.7*   < > = values in this interval not displayed.    CBG: Recent Labs  Lab 04/02/24 0426  GLUCAP 145*     Sepsis Labs: Recent Labs  Lab 04/02/24 0030 04/02/24 0439  LATICACIDVEN 0.8 0.6    Recent Results (from the past 240 hours)  Culture, body fluid w Gram Stain-bottle     Status: None   Collection Time: 03/30/24  9:05 AM   Specimen: Pleura  Result Value Ref Range Status   Specimen Description PLEURAL RIGHT  Final   Special Requests   Final    BOTTLES DRAWN AEROBIC AND ANAEROBIC Blood Culture adequate volume   Culture   Final    NO GROWTH 5 DAYS Performed at [facility], [address] [place], [place] [address]  Report Status 04/04/2024 FINAL  Final  Gram stain     Status: None   Collection Time: 03/30/24  9:05 AM   Specimen: Pleura  Result Value Ref Range Status   Specimen Description PLEURAL  Final   Special Requests NONE  Final   Gram Stain   Final    CYTOSPIN SMEAR NO ORGANISMS  SEEN WBC PRESENT,BOTH PMN AND MONONUCLEAR Performed at [facility], [address] [place], [place] [address]    Report Status 03/30/2024 FINAL  Final  Aerobic/Anaerobic Culture w Gram Stain (surgical/deep wound)     Status: None (Preliminary result)   Collection Time: 04/01/24 10:40 AM   Specimen: Kidney  Result Value Ref Range Status   Specimen Description KIDNEY  Final   Special Requests LEFT  Final   Gram Stain   Final    ABUNDANT WBC PRESENT, PREDOMINANTLY PMN MODERATE YEAST    Culture   Final    ABUNDANT CANDIDA GLABRATA Sent to [facility] for further susceptibility testing. NO ANAEROBES ISOLATED Performed at [facility], [address], [place], [place] [address]    Report Status PENDING  Incomplete  Body fluid culture w Gram Stain     Status: None   Collection Time: 04/01/24 11:06 AM   Specimen: Kidney; Body Fluid  Result Value Ref Range Status   Specimen Description KIDNEY  Final   Special Requests RIGHT RENAL  Final   Gram Stain   Final    FEW WBC PRESENT, PREDOMINANTLY PMN RARE BUDDING YEAST SEEN Performed at [facility], [address] [place], [place] [address]    Culture FEW JANIS DEEMS  Final   Report Status 04/04/2024 FINAL  Final  MRSA Next Gen by PCR, Nasal     Status: Abnormal   Collection Time: 04/01/24 11:01 PM   Specimen: Nasal Mucosa; Nasal Swab  Result Value Ref Range Status   MRSA by PCR Next Gen DETECTED (A) NOT DETECTED Final    Comment: RESULT CALLED TO, READ BACK BY AND VERIFIED WITH: G ROOS AT 1248 ON [id number] BY S DALTON (NOTE) The GeneXpert MRSA Assay (FDA approved for NASAL specimens only), is one component of a comprehensive MRSA colonization surveillance program. It is not intended to diagnose MRSA infection nor to guide or monitor treatment for MRSA infections. Test performance is not FDA approved in patients less than 82 years old. Performed at [name], [name] [place], [place] [address]   Culture,  blood (Routine X 2) w Reflex to ID Panel     Status: None   Collection Time: 04/02/24 12:30 AM   Specimen: Right Antecubital; Blood  Result Value Ref Range Status   Specimen Description RIGHT ANTECUBITAL  Final   Special Requests   Final    BOTTLES DRAWN AEROBIC AND ANAEROBIC Blood Culture adequate volume   Culture   Final    NO GROWTH 5 DAYS Performed at [facility], [address], [place], [place] [address]    Report Status 04/07/2024 FINAL  Final  Culture, blood (Routine X 2) w Reflex to ID Panel     Status: None   Collection Time: 04/02/24 12:40 AM   Specimen: BLOOD RIGHT HAND  Result Value Ref Range Status   Specimen Description BLOOD RIGHT HAND  Final   Special Requests   Final    BOTTLES DRAWN AEROBIC AND ANAEROBIC Blood Culture adequate volume   Culture   Final    NO GROWTH 5 DAYS Performed at [facility], [address] [place], [place] [address]    Report Status 04/07/2024 FINAL  Final  Fungus culture, blood     Status: None (Preliminary result)   Collection Time: 04/02/24 11:39 AM   Specimen: BLOOD  Result Value Ref Range Status   Specimen Description BLOOD PICC LINE  Final   Special Requests   Final    BOTTLES DRAWN AEROBIC AND ANAEROBIC Blood Culture adequate volume Performed at [facility], [address] [place], [place] [address]    Culture PENDING  Incomplete   Report Status PENDING  Incomplete  Yeast Susceptibilities     Status: None   Collection Time: 04/04/24 11:27 AM  Result Value Ref Range Status   SOURCE [id number] CANDIDA GLABRATA SENSI NEPHFRSTOMY TUBE  Corrected    Comment: Performed at [facility], [address], [place], [place] [address] CORRECTED ON 10/27 AT 1323: PREVIOUSLY REPORTED AS yeast sensi    Organism ID, Yeast Preliminary report  Final    Comment: (NOTE) Specimen has been received and testing has been initiated. Performed At: [facility] [address] [place], [place] [id number] Jennette Shorter MD Ey:[id number]     Amphotericin B MIC PENDING  Incomplete   Anidulafungin MIC PENDING  Incomplete   Caspofungin MIC PENDING  Incomplete   Fluconazole Islt MIC PENDING  Incomplete   ISAVUCONAZOLE MIC PENDING  Incomplete   Itraconazole MIC PENDING  Incomplete   Micafungin MIC PENDING  Incomplete   Posaconazole MIC PENDING  Incomplete   REZAFUNGIN MIC PENDING  Incomplete   Voriconazole MIC PENDING  Incomplete         Radiology Studies: No results found.       Scheduled Meds:  amLODipine   5 mg Oral Daily   Chlorhexidine  Gluconate Cloth  6 each Topical Q0600   cycloSPORINE   1 drop Both Eyes BID   feeding supplement  237 mL Oral BID BM   folic acid   1 mg Oral Daily   liver oil-zinc oxide   Topical Q0600   metoprolol  tartrate  12.5 mg Oral BID   mupirocin ointment   Nasal BID   pantoprazole   40 mg Oral Daily   sodium bicarbonate  650 mg Oral TID   sodium chloride  flush  5 mL Intracatheter Q8H   ticagrelor   60 mg Oral Daily   traZODone   100 mg Oral QHS   vitamin B-12  250 mcg Oral Daily     LOS: 11 days    Time spent: 55 minutes    Adriana DELENA Grams, MD [facility]  If 7PM-7AM, please contact night-coverage [email or web address] 04/08/2024, 10:25 AM

## 2024-04-08 NOTE — Care Management Important Message (Signed)
 Important Message  Patient Details  Name: Robin Blackburn MRN: [id number] Date of Birth: Feb 06, 1946   Important Message Given:  Yes - Medicare IM     [name] 04/08/2024, 4:23 PM

## 2024-04-08 NOTE — Progress Notes (Signed)
 Pt complained of nausea, prn Zofran  given. Pt tolerated well. Pt care ongoing

## 2024-04-08 NOTE — Progress Notes (Signed)
 PHARMACY CONSULT NOTE FOR:  OUTPATIENT  PARENTERAL ANTIBIOTIC THERAPY (OPAT)  Indication: Fungal UTI Regimen: Micafungin 100 mg daily End date: 04/18/2024  IV antibiotic discharge orders are pended. To discharging provider:  please sign these orders via discharge navigator,  Select New Orders & click on the button choice - Manage This Unsigned Work.     Thank you for allowing pharmacy to be a part of this patient's care.  R. Samual Satterfield, PharmD PGY-1 Acute Care Pharmacy Resident 04/08/2024 12:31 PM

## 2024-04-08 NOTE — Consult Note (Addendum)
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 of the renal pelves and ureters concerning for urinary tract infection. No urinary tract calculi. The adrenals are unremarkable. Stomach/Bowel: No bowel obstruction or ileus. Distal colonic diverticulosis without diverticulitis. No bowel wall thickening or inflammatory change. Vascular/Lymphatic: Aortic atherosclerosis. No enlarged abdominal or pelvic lymph nodes. Reproductive: Status post hysterectomy. No adnexal masses. Other: No free fluid or free intraperitoneal gas. No abdominal wall hernia. Musculoskeletal: No acute or destructive bony abnormalities. Reconstructed images demonstrate no additional findings. IMPRESSION: 1. Stable bilateral hydroureteronephrosis. 2. Stable  mucosal thickening of the renal pelves and bilateral ureters, as well as diffuse bladder wall thickening and perivesicular fat stranding, concerning for cystitis and urinary tract infection. 3. Bilateral pleural effusions, right greater than left, increased since prior exam. 4.  Aortic Atherosclerosis (ICD10-I70.0). Electronically Signed   By: Ozell Daring M.D.   On: 03/28/2024 18:12   MR BRAIN WO CONTRAST Result Date: 03/14/2024 EXAM: MRI BRAIN WITHOUT CONTRAST 03/14/2024 07:25:27 PM TECHNIQUE: Multiplanar multisequence MRI of the head/brain was performed without the administration of intravenous contrast. COMPARISON: 01/05/2023 CLINICAL HISTORY: Neuro deficit, acute, stroke suspected; dyarthria. FINDINGS: BRAIN AND VENTRICLES: No acute infarct. No intracranial hemorrhage. No mass. No midline shift. No hydrocephalus. The sella is unremarkable. Normal flow voids. Multifocal hyperintense T2-weighted signal within the cerebral white matter, most commonly due to chronic small vessel disease. Mild volume loss old left thalamic small vessel infarct. ORBITS: No acute abnormality. SINUSES AND MASTOIDS: No acute abnormality. BONES AND SOFT TISSUES: Normal marrow signal. No acute soft tissue abnormality. IMPRESSION: 1. No acute intracranial abnormality. 2. Multifocal hyperintense T2-weighted signal within the cerebral white matter, most commonly due to chronic small vessel disease. 3. 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 4. Mild volume loss. Electronically signed by: Franky Stanford MD 03/14/2024 10:57 PM EDT RP Workstation: [id number]   US  Renal Result Date: 03/10/2024 EXAM: US  Retroperitoneum Complete, Renal. CLINICAL HISTORY: acute kidney failure. TECHNIQUE: Real-time ultrasound of the retroperitoneum (complete) with image documentation. COMPARISON: CT dated 03/10/2024. FINDINGS: RIGHT KIDNEY: Right kidney measures 10.2 x 5.5 x 5.4 cm. Volume is 161 cc. Moderate hydroureteronephrosis, as on CT. No renal stone or  mass visualized. LEFT KIDNEY: Left kidney measures 10.6 x 5.9 x 4.6 cm. Volume is 154 cc. Moderate hydroureteronephrosis, as on CT. No renal stone or mass visualized. BLADDER: Mild bladder wall thickening. Prevoid Bladder Volume of 42 cc. IMPRESSION: 1. Moderate bilateral hydroureteronephrosis. 2. Mild bladder wall thickening, likely due to bladder outlet obstruction. Electronically signed by: Rockey Kilts MD 03/10/2024 05:17 PM EDT RP Workstation: [id number]   CT Renal Stone Study Result Date: 03/10/2024 EXAM: CT UROGRAM 03/10/2024 03:58:52 PM TECHNIQUE: CT of the abdomen and pelvis was performed without the  administration of intravenous contrast. Multiplanar reformatted images as well as MIP urogram images are provided for review. Automated exposure control, iterative reconstruction, and/or weight based adjustment of the mA/kV was utilized to reduce the radiation dose to as low as reasonably achievable. COMPARISON: 01/07/2024 CLINICAL HISTORY: acute kidney failure. Pt brb EMS from [facility] for Abnormal Labs. All V/S WDL. No other complaints at this time. FINDINGS: LOWER CHEST: Small bilateral pleural effusions, right greater than left. Minimal dependent lower lobe atelectasis. LIVER: The liver is unremarkable. GALLBLADDER AND BILE DUCTS: Gallbladder is unremarkable. No biliary ductal dilatation. SPLEEN: No acute abnormality. PANCREAS: No acute abnormality. ADRENAL GLANDS: No acute abnormality. KIDNEYS, URETERS AND BLADDER: There is continued bilateral hydroureteronephrosis unchanged since prior exam. Stable bilateral mucosal thickening throughout the renal pelvis and bilateral ureters. Persistent bladder wall thickening with multiple bladder diverticula, consistent with sequela of bladder outlet obstruction. Perivesicular fat stranding could reflect superimposed cystitis. No stones in the kidneys or ureters. No perinephric or periureteral stranding. GI AND BOWEL: Stomach demonstrates no acute  abnormality. Sigmoid diverticulosis without evidence of acute diverticulitis. Appendix is surgically absent. There is no bowel obstruction. PERITONEUM AND RETROPERITONEUM: No ascites. No free air. VASCULATURE: Aorta is normal in caliber. Aortic atherosclerosis. LYMPH NODES: No lymphadenopathy. REPRODUCTIVE ORGANS: No acute abnormality. BONES AND SOFT TISSUES: No acute osseous abnormality. No focal soft tissue abnormality. IMPRESSION: 1. Findings compatible with bladder outlet obstruction, with persistent bladder wall thickening and stable bilateral hydroureteronephrosis. Perivesicular Fat stranding and uroepithelial mural thickening consistent with cystitis and urinary tract infection. Please correlate with urinalysis. 2. No urinary tract calculi. 3. Sigmoid diverticulosis without diverticulitis. Electronically signed by: Ozell Daring MD 03/10/2024 04:05 PM EDT RP Workstation: [id number]   I discussed the limitations of evaluation and management by telemedicine. The patient expressed understanding and agreed to proceed.  I discussed the assessment and treatment plan with the patient. The patient was provided an opportunity to ask questions and all were answered. The patient agreed with the plan and demonstrated an understanding of the instructions.   The patient was advised to call back or seek an in-person evaluation if the symptoms worsen or if the condition fails to improve as anticipated.  I spent 85 minutes involved in face-to-face and non-face-to-face activities for this patient on the day of the visit. Professional time spent includes the following activities: Preparing to see the patient (review of tests), Obtaining and reviewing separately obtained history (ED note, H&P, hospitalist progress note, nephrology note), Performing a medically appropriate examination and evaluation , Ordering medications/labs, referring and communicating with other health care professionals, Documenting clinical  information in the EMR, Independently interpreting results (not separately reported), Communicating results to the patient, Counseling and educating the patient and Care coordination (not separately reported).   Electronically signed by:   Plan d/w requesting provider as well as ID pharm D  Of note, portions of this note may have been created with voice recognition software. While this note has been edited for accuracy, occasional wrong-word or 'sound-a-like' substitutions may have occurred due to the inherent limitations of voice recognition software.   Annalee Orem, MD Infectious Disease Physician [facility] Pager: [phone]

## 2024-04-08 NOTE — Progress Notes (Signed)
 Spoke with patient's POA Jaferrell per patient's request to make her aware there is conversation regarding the patient's bed status over at [facility] and that per Social Work someone from the facility is coming to speak with the patient today about her status.

## 2024-04-08 NOTE — Plan of Care (Signed)
  Problem: Pain Managment: Goal: General experience of comfort will improve and/or be controlled Outcome: Progressing   Problem: Safety: Goal: Ability to remain free from injury will improve Outcome: Progressing

## 2024-04-09 DIAGNOSIS — N185 Chronic kidney disease, stage 5: Secondary | ICD-10-CM | POA: Diagnosis not present

## 2024-04-09 DIAGNOSIS — N179 Acute kidney failure, unspecified: Secondary | ICD-10-CM | POA: Diagnosis not present

## 2024-04-09 LAB — BASIC METABOLIC PANEL WITH GFR
Anion gap: 13 (ref 5–15)
BUN: 8 mg/dL (ref 8–23)
CO2: 25 mmol/L (ref 22–32)
Calcium: 8.3 mg/dL — ABNORMAL LOW (ref 8.9–10.3)
Chloride: 100 mmol/L (ref 98–111)
Creatinine, Ser: 1.41 mg/dL — ABNORMAL HIGH (ref 0.44–1.00)
GFR, Estimated: 38 mL/min — ABNORMAL LOW (ref >60)
Glucose, Bld: 116 mg/dL — ABNORMAL HIGH (ref 70–99)
Potassium: 3.5 mmol/L (ref 3.5–5.1)
Sodium: 138 mmol/L (ref 135–145)

## 2024-04-09 MED ORDER — SODIUM BICARBONATE 650 MG PO TABS: 650.0000 mg | ORAL_TABLET | Freq: Three times a day (TID) | ORAL | 0 refills | Status: AC

## 2024-04-09 MED ORDER — SODIUM CHLORIDE 0.9 % IV BOLUS
500.0000 mL | Freq: Once | INTRAVENOUS | Status: AC
  Administered 2024-04-09: 500 mL via INTRAVENOUS

## 2024-04-09 NOTE — Plan of Care (Signed)
  Problem: Education: Goal: Knowledge of General Education information will improve Description: Including pain rating scale, medication(s)/side effects and non-pharmacologic comfort measures Outcome: Adequate for Discharge   Problem: Health Behavior/Discharge Planning: Goal: Ability to manage health-related needs will improve Outcome: Adequate for Discharge   Problem: Clinical Measurements: Goal: Ability to maintain clinical measurements within normal limits will improve Outcome: Adequate for Discharge Goal: Will remain free from infection Outcome: Adequate for Discharge Goal: Diagnostic test results will improve Outcome: Adequate for Discharge Goal: Respiratory complications will improve Outcome: Adequate for Discharge Goal: Cardiovascular complication will be avoided Outcome: Adequate for Discharge   Problem: Activity: Goal: Risk for activity intolerance will decrease Outcome: Adequate for Discharge   Problem: Nutrition: Goal: Adequate nutrition will be maintained Outcome: Adequate for Discharge   Problem: Coping: Goal: Level of anxiety will decrease Outcome: Adequate for Discharge   Problem: Elimination: Goal: Will not experience complications related to bowel motility Outcome: Adequate for Discharge Goal: Will not experience complications related to urinary retention Outcome: Adequate for Discharge   Problem: Pain Managment: Goal: General experience of comfort will improve and/or be controlled Outcome: Adequate for Discharge   Problem: Safety: Goal: Ability to remain free from injury will improve Outcome: Adequate for Discharge   Problem: Skin Integrity: Goal: Risk for impaired skin integrity will decrease Outcome: Adequate for Discharge   Problem: Education: Goal: Knowledge of disease and its progression will improve Outcome: Adequate for Discharge Goal: Individualized Educational Video(s) Outcome: Adequate for Discharge   Problem: Fluid Volume: Goal:  Compliance with measures to maintain balanced fluid volume will improve Outcome: Adequate for Discharge   Problem: Health Behavior/Discharge Planning: Goal: Ability to manage health-related needs will improve Outcome: Adequate for Discharge   Problem: Nutritional: Goal: Ability to make healthy dietary choices will improve Outcome: Adequate for Discharge   Problem: Clinical Measurements: Goal: Complications related to the disease process, condition or treatment will be avoided or minimized Outcome: Adequate for Discharge

## 2024-04-09 NOTE — Discharge Summary (Addendum)
 Physician Discharge Summary   Patient: Robin Blackburn MRN: [id number] DOB: [birth year]  Admit date:     03/28/2024  Discharge date: 04/09/24  Discharge Physician: Adriana DELENA Grams   PCP: Bertell Satterfield, MD   Recommendations at discharge:   Follow-up with nephrologist in 1 week Follow-up with the PCP within 1- 2 weeks Follow-up with gastroenterologist in 4-6 weeks Follow-up with infectious disease team in 1 week continue antifungal treatment per recommendation Follow with the palliative care team as an outpatient Urine culture positive for Candida-sensitivity pending per ID to continue treating with micafungin IV Please follow-up with the final sensitivity   Discharge Diagnoses: Principal Problem:   Acute kidney injury superimposed on stage 5 chronic kidney disease, not on chronic dialysis (HCC) Active Problems:   Urinary tract infection   Essential hypertension   Rheumatoid arthritis (HCC)   GERD (gastroesophageal reflux disease)   Metabolic acidosis   Bilateral pleural effusion   Obstructive uropathy   Macrocytic anemia   Hypoalbuminemia due to protein-calorie malnutrition  Robin Blackburn is a 78 y.o. female with medical history significant of hypertension, rheumatoid arthritis, fibromyalgia, chronic pain, prior stroke, bladder outlet obstruction who presents to the emergency department via EMS due to abnormal lab.  Patient states that she was told that her kidney function was worsening, so she was sent to the ED for further evaluation and management.  She complained about nausea generalized weakness and abdominal pain but denies any discomfort with her urinary catheter.  Also denies fever, chills, cough, chest pain, shortness of breath, nausea, vomiting.  CT abdomen and pelvis without contrast showed stable bilateral hydroureteronephrosis showed concern for cystitis and urinary tract infection.  Bilateral pleural effusions, right greater than left, increased since prior  exam. Foley catheter was changed, patient was started on IV meropenem , Zofran  was given due to nausea and IV hydration was provided.  Patient is admitted to the hospitalist service for further management evaluation of AKI, metabolic acidosis, UTI, pleural effusions.   Patient was noted to have some tachycardia as well as fever overnight and was shifted to the ICU for closer evaluation and was started on IV Merrem  as well as Diflucan given some of the findings in her urine cultures from bilateral nephrostomy tube placement that took place on 10/24.  Patient noted to have worsening anemia on 10/27 for which 1 unit PRBC will be given.  Patient now on micafungin for UTI until further sensitivities return.    Acute kidney injury superimposed on stage III chronic kidney disease Obstructive uropathy status post bilateral percutaneous nephrostomy tube placement 10/24-improving Lab Results  Component Value Date   CREATININE 1.41 (H) 04/09/2024   CREATININE 1.38 (H) 04/08/2024   CREATININE 1.24 (H) 04/07/2024   Patient to follow-up with nephrologist as an outpatient     On this admission : Was treated with IV hydration  Renally adjust medications, avoid nephrotoxic agents/dehydration/hypotension Patient is agreeable to HD if needed, but appears to be a poor candidate per nephrology Foley catheter discontinued 10/24 Nephrology has signed off Hemodynamically stable   Fungal UTI Fungal blood cultures pending, but routine blood cultures negative CT abdomen and pelvis without contrast was suggestive of UTI Urine culture done on 01/04/2024 was positive for Klebsiella pneumoniae (ESBL) which was only sensitive to imipenem and gentamicin. Placed on Micafungin starting 10/27 after discussion with pharmacy until further sensitivities returned (Has been discussed with ID, to treat until )   Bilateral pleural effusion CT abdomen and pelvis without contrast showed bilateral  pleural effusions, right greater  than left, increased since prior exam. Right-sided thoracentesis done with removed.  Fluid cloudy-straw neutrophil 26, LD 155, total protein < 3-appears transudate   Macrocytic anemia MCV 100.7, hemoglobin 8.9, hematocrit 29.4 Folate and vitamin B12 levels normal. Continue to monitor and transfuse as needed for hemoglobin less than 7 1 unit PRBC transfusion 10/27 Hemoglobin 6.9>>>>>  9.5   Hypoalbuminemia possibly secondary to moderate protein calorie malnutrition Albumin 2.7, protein supplement will be provided   Essential Hypertension Continue metoprolol  12.5 twice daily, increased Norvasc  to 5mg .   Rheumatoid arthritis Stable, Leflunomide  and adalimumab will be held in the setting of acute infection   GERD Continue Protonix          Consultants: Infectious disease team/nephrology/urology Procedures performed: None Disposition: Skilled nursing facility Diet recommendation:  Discharge Diet Orders (From admission, onward)     Start     Ordered   04/09/24 0000  Diet - low sodium heart healthy        04/09/24 1028           Regular diet DISCHARGE MEDICATION: Allergies as of 04/09/2024       Reactions   Cortisone Anaphylaxis   Cardiovascular Arrest   Fentanyl  Anaphylaxis, Hives, Other (See Comments)   felt like I had demons in my head   Hydromorphone  Anaphylaxis, Nausea And Vomiting   GI Intolerance   Iodinated Contrast Media Anaphylaxis, Hives, Rash, Dermatitis   Respiratory Distress Iodinated contrast media (substance)   Keflex  [cephalexin ] Nausea And Vomiting   Meperidine Anaphylaxis, Shortness Of Breath   Respiratory Distress   Morphine Anaphylaxis, Nausea And Vomiting, Swelling, Rash   GI Intolerance   Oxycodone -acetaminophen  Anaphylaxis, Nausea And Vomiting, Swelling, Dermatitis, Rash   GI Intolerance, Mouth swelling. acetaminophen  / oxycodone    Penicillins Anaphylaxis, Nausea And Vomiting, Other (See Comments)   Immediate rash,  facial/tongue/throat swelling, SOB or lightheadedness with hypotension Product containing penicillin (product)   Afluria Preservative Free [influenza Virus Vacc Split Pf] Other (See Comments)   Unknown   Aspirin  Other (See Comments)   GI Intolerance   Codeine Other (See Comments)   Unknown    Influenza Virus Vaccine Other (See Comments)   Unknown    Meperidine Hcl Other (See Comments)   Unknown   Oxycodone  Hcl Other (See Comments)   Unknown   Shellfish Allergy Other (See Comments)   Glucosamine not an option Unknown    Sulfa  Antibiotics Nausea And Vomiting   Tolerates Bactrim  though   Troleandomycin Nausea And Vomiting, Swelling   GI Intolerance   Potassium Chloride  Other (See Comments)   No reaction listed on MAR   Bisphosphonates Hives, Other (See Comments)   GI intolerance   Ciprofloxacin  Nausea Only, Rash   Levaquin [levofloxacin In D5w] Nausea And Vomiting, Rash   Mental Status Changes, Confusion   Oxytetracycline Nausea And Vomiting   Wheat Rash        Medication List     PAUSE taking these medications    Humira (2 Pen) 40 MG/0.8ML Ajkt pen Wait to take this until: April 15, 2024 Generic drug: adalimumab Inject 40 mg into the skin every 14 (fourteen) days.       STOP taking these medications    benzonatate 200 MG capsule Commonly known as: TESSALON   chlorhexidine  0.12 % solution Commonly known as: PERIDEX    dextromethorphan-guaiFENesin 30-600 MG 12hr tablet Commonly known as: MUCINEX DM   dicyclomine  20 MG tablet Commonly known as: BENTYL    fluconazole IVPB Commonly  known as: DIFLUCAN   fluticasone 50 MCG/ACT nasal spray Commonly known as: FLONASE   guaiFENesin-dextromethorphan 100-10 MG/5ML syrup Commonly known as: ROBITUSSIN DM   LINEZOLID IV   methocarbamol  500 MG tablet Commonly known as: ROBAXIN    phenazopyridine  200 MG tablet Commonly known as: PYRIDIUM    polyethylene glycol 17 g packet Commonly known as: MIRALAX  /  GLYCOLAX    saccharomyces boulardii 250 MG capsule Commonly known as: FLORASTOR   sodium chloride  0.9 % infusion   torsemide  20 MG tablet Commonly known as: DEMADEX        TAKE these medications    ALPRAZolam  0.5 MG tablet Commonly known as: XANAX  Take 1 tablet (0.5 mg total) by mouth 2 (two) times daily as needed for anxiety.   amLODipine  2.5 MG tablet Commonly known as: NORVASC  Take 2.5 mg by mouth daily. Hold for SBP<110   Cholecalciferol  125 MCG (5000 UT) Tabs Take 1 tablet by mouth daily.   Cranberry 450 MG Tabs Take 1 tablet by mouth in the morning and at bedtime. For UTI   estradiol  0.1 MG/GM vaginal cream Commonly known as: ESTRACE  Place 1 Applicatorful vaginally every other day. Discard plastic applicator. Insert a blueberry size amount (approximately 1 gram) of cream on fingertip inside vagina at bedtime every other night for long term use. What changed: additional instructions   ferrous sulfate  325 (65 FE) MG EC tablet Take 1 tablet (325 mg total) by mouth daily with breakfast.   folic acid  1 MG tablet Commonly known as: FOLVITE  Take 1 mg by mouth daily.   gabapentin  300 MG capsule Commonly known as: NEURONTIN  Take 1 capsule (300 mg total) by mouth 3 (three) times daily as needed (neuropathy pain symptoms). What changed:  when to take this reasons to take this   lansoprazole 30 MG capsule Commonly known as: PREVACID Take 60 mg by mouth 2 (two) times daily before a meal. DO NOT CHANGE PER MD   leflunomide  10 MG tablet Commonly known as: ARAVA  Take 10 mg by mouth daily.   Melatonin 10 MG Tabs Take 10 mg by mouth at bedtime.   methenamine  1 g tablet Commonly known as: HIPREX  Take 1 tablet (1 g total) by mouth 2 (two) times daily. Prophylactic   metoprolol  tartrate 25 MG tablet Commonly known as: LOPRESSOR  Take 0.5 tablets (12.5 mg total) by mouth 2 (two) times daily.   micafungin 50 MG injection Commonly known as: MYCAMINE Inject 100 mg into  the vein daily for 10 days. Indication:  Fungal UTI First Dose: Yes Last Day of Therapy:  04/18/2024 Labs - Once weekly:  CBC/D and BMP, Labs - Once weekly: ESR and CRP   Nutritional Supplement Liqd Take 120 mLs by mouth daily at 6 PM. House supplement on MAR   ondansetron  4 MG tablet Commonly known as: ZOFRAN  Take 4 mg by mouth every 8 (eight) hours as needed for vomiting or nausea.   PreserVision AREDS 2 Caps Take 1 capsule by mouth in the morning and at bedtime.   Restasis  0.05 % ophthalmic emulsion Generic drug: cycloSPORINE  Place 1 drop into both eyes 2 (two) times daily. Dry eyes   senna 8.6 MG tablet Commonly known as: SENOKOT Take 1 tablet by mouth 2 (two) times daily.   sodium bicarbonate 650 MG tablet Take 1 tablet (650 mg total) by mouth 3 (three) times daily.   ticagrelor  60 MG Tabs tablet Commonly known as: BRILINTA  Take 60 mg by mouth daily.   traMADol  50 MG tablet Commonly known as:  ULTRAM  Take 1 tablet (50 mg total) by mouth every 8 (eight) hours as needed for severe pain (pain score 7-10).   traZODone  100 MG tablet Commonly known as: DESYREL  Take 100 mg by mouth at bedtime.   vitamin B-12 250 MCG tablet Commonly known as: CYANOCOBALAMIN  Take 250 mcg by mouth daily.               Home Infusion Instuctions  (From admission, onward)           Start     Ordered   04/08/24 0000  Home infusion instructions       Question:  Instructions  Answer:  Flushing of vascular access device: 0.9% NaCl pre/post medication administration and prn patency; Heparin  100 u/ml, 5ml for implanted ports and Heparin  10u/ml, 5ml for all other central venous catheters.   04/08/24 1207              Discharge Care Instructions  (From admission, onward)           Start     Ordered   04/09/24 0000  Discharge wound care:       Comments: Per RN wound care instructions   04/09/24 1028            Discharge Exam: Filed Weights   03/28/24 1440 04/01/24  2343 04/04/24 0614  Weight: 72 kg 82.7 kg 76.7 kg        General:  AAO x 3,  cooperative, no distress;   HEENT:  Normocephalic, PERRL, otherwise with in Normal limits   Neuro:  CNII-XII intact. , normal motor and sensation, reflexes intact   Lungs:   Clear to auscultation BL, Respirations unlabored,  No wheezes / crackles  Cardio:    S1/S2, RRR, No murmure, No Rubs or Gallops   Abdomen:  Bilateral flank nephrostomy, no CVA tenderness Soft, non-tender, bowel sounds active all four quadrants, no guarding or peritoneal signs.  Muscular  skeletal:  Limited exam -global generalized weaknesses - in bed, able to move all 4 extremities,   2+ pulses,  symmetric, No pitting edema  Skin:  Dry, warm to touch, negative for any Rashes,  Wounds: Bilateral nephrostomy tube in place, clear yellowish urine in bag          Condition at discharge: fair  The results of significant diagnostics from this hospitalization (including imaging, microbiology, ancillary and laboratory) are listed below for reference.   Imaging Studies: DG CHEST PORT 1 VIEW Result Date: 04/02/2024 EXAM: 1 VIEW(S) XRAY OF THE CHEST 04/02/2024 01:12:00 AM COMPARISON: Chest x-ray 03/30/2024. CLINICAL HISTORY: Sepsis (HCC), fever, tachypnea. FINDINGS: LUNGS AND PLEURA: No focal pulmonary opacity. No pulmonary edema. No pleural effusion. No pneumothorax. HEART AND MEDIASTINUM: Heart is enlarged, unchanged. BONES AND SOFT TISSUES: No acute osseous abnormality. IMPRESSION: 1. No acute findings. 2. Enlarged heart, unchanged. Electronically signed by: Greig Pique MD 04/02/2024 01:14 AM EDT RP Workstation: [id number]   IR NEPHROSTOMY PLACEMENT BILATERAL Result Date: 04/01/2024 INDICATION: Obstructive uropathy and hydronephrosis. EXAM: Ultrasound and fluoroscopic guided percutaneous nephrostomy tube placement COMPARISON:  None Available. MEDICATIONS: 2 g IV Rocephin ; The antibiotic was administered in an appropriate time frame prior  to skin puncture. CONTRAST:  3 mL Gadavist-administered into the collecting system(s) FLUOROSCOPY: Radiation Exposure Index (as provided by the fluoroscopic device): 13 mGy Kerma COMPLICATIONS: None immediate. PROCEDURE: Informed written consent was obtained from the patient after a thorough discussion of the procedural risks, benefits and alternatives. All questions were addressed. Maximal Sterile Barrier Technique was utilized  including caps, mask, sterile gowns, sterile gloves, sterile drape, hand hygiene and skin antiseptic. A timeout was performed prior to the initiation of the procedure. Bilateral flank regions were prepped and draped in usual sterile fashion. Local anesthesia was achieved by infiltrating subcutaneous tissue of the flank regions to the renal cortex with 1% lidocaine . A small incision was made in the left flank region and an Accustick needle was advanced under ultrasound guidance. The patient had hydronephrosis in the needle tip was directed to a dilated renal calyx. Once visualized within the calyx, the stylet was removed leaving the needle behind. Pus was obtained. A small volume of gadolinium was then injected under fluoroscopic guidance filling the calices and dilated renal pelvis. An 018 guidewire was then advanced into the proximal ureter. The Accustick sheath was then advanced over the guidewire into the proximal ureter. An 035 guidewire was then advanced through the sheath into the proximal ureter. The access site was dilated. A 10 French pigtail catheter was then advanced over the guidewire and coiled within the renal pelvis. A small sample of fluid was then collected and sent to pathology for culture and sensitivity. Accustick needle was then advanced through a small incision in the right flank region using ultrasound guidance. When the needle tip was identified within the dilated renal collecting system, the stylet was removed and again pus was obtained. A small volume of dilute  gadolinium was injected in order to perform an antegrade pyelogram which demonstrated hydronephrosis and a dilated renal pelvis. Access was then exchanged for an 035 guidewire which was advanced into the proximal ureter. The access site was dilated and a 10 French pigtail catheter was advanced over the guidewire. The pigtail catheters were locked in position. Retention sutures were applied bilaterally. Sterile dressing was applied bilaterally. Catheters were connected to gravity drainage. IMPRESSION: Bilateral 10 French percutaneous nephrostomy tubes placed as described above. Pus was removed from the collecting system bilaterally and sent to pathology for culture and sensitivity. Electronically Signed   By: Cordella Banner   On: 04/01/2024 12:41   US  THORACENTESIS ASP PLEURAL SPACE W/IMG GUIDE Result Date: 03/30/2024 INDICATION: 78 year old female. Admitted for abnormal lab value. Found to have a right-sided pleural effusion. Request is for therapeutic and diagnostic right sided thoracentesis EXAM: ULTRASOUND GUIDED THERAPEUTIC AND DIAGNOSTIC RIGHT-SIDED THORACENTESIS MEDICATIONS: Lidocaine  1% 10 mL COMPLICATIONS: None immediate. PROCEDURE: An ultrasound guided thoracentesis was thoroughly discussed with the patient and questions answered. The benefits, risks, alternatives and complications were also discussed. The patient understands and wishes to proceed with the procedure. Written consent was obtained. Ultrasound was performed to localize and mark an adequate pocket of fluid in the right chest. The area was then prepped and draped in the normal sterile fashion. 1% Lidocaine  was used for local anesthesia. Under ultrasound guidance a 6 Fr Safe-T-Centesis catheter was introduced. Thoracentesis was performed. The catheter was removed and a dressing applied. FINDINGS: A total of approximately 600 mL of pale yellow fluid was removed. Samples were sent to the laboratory as requested by the clinical team.  IMPRESSION: Successful ultrasound guided therapeutic and diagnostic right-sided thoracentesis yielding 600 mL of pale yellow pleural fluid. Performed by Delon Beagle NP Electronically Signed   By: Ester Sides M.D.   On: 03/30/2024 11:14   DG Chest 1 View Result Date: 03/30/2024 CLINICAL DATA:  Post thoracentesis today. EXAM: CHEST  1 VIEW COMPARISON:  Chest x-ray 02/18/2024, CT 03/28/2024 FINDINGS: Left-sided PICC line is present with tip over the axillary vein. Patient  is rotated to the left. Lungs are hypoinflated. No significant right-sided effusion post thoracentesis. No pneumothorax. Minimal patchy density right base likely atelectasis. Minimal stable tenting of the left hemidiaphragm. Left lung otherwise clear. Cardiomediastinal silhouette and remainder of the exam is unchanged. IMPRESSION: Hypoinflation with minimal patchy density right base likely atelectasis. No significant right-sided effusion post thoracentesis. No pneumothorax. Electronically Signed   By: Toribio Agreste M.D.   On: 03/30/2024 09:34   CT ABDOMEN PELVIS WO CONTRAST Result Date: 03/28/2024 CLINICAL DATA:  Acute abdominal pain EXAM: CT ABDOMEN AND PELVIS WITHOUT CONTRAST TECHNIQUE: Multidetector CT imaging of the abdomen and pelvis was performed following the standard protocol without IV contrast. RADIATION DOSE REDUCTION: This exam was performed according to the departmental dose-optimization program which includes automated exposure control, adjustment of the mA and/or kV according to patient size and/or use of iterative reconstruction technique. COMPARISON:  03/10/2024 FINDINGS: Lower chest: There are bilateral pleural effusions, right greater than left, increased since prior study. Compressive atelectasis within the lower lobes. Hepatobiliary: Cholecystectomy. Unremarkable unenhanced appearance of the liver. Pancreas: Unremarkable unenhanced appearance. Spleen: Unremarkable unenhanced appearance. Adrenals/Urinary Tract: The  bladder is decompressed with a Foley catheter. Persistent bladder wall thickening and perivesicular fat stranding. Stable bilateral hydroureteronephrosis. Stable mucosal thickening of the renal pelves and ureters concerning for urinary tract infection. No urinary tract calculi. The adrenals are unremarkable. Stomach/Bowel: No bowel obstruction or ileus. Distal colonic diverticulosis without diverticulitis. No bowel wall thickening or inflammatory change. Vascular/Lymphatic: Aortic atherosclerosis. No enlarged abdominal or pelvic lymph nodes. Reproductive: Status post hysterectomy. No adnexal masses. Other: No free fluid or free intraperitoneal gas. No abdominal wall hernia. Musculoskeletal: No acute or destructive bony abnormalities. Reconstructed images demonstrate no additional findings. IMPRESSION: 1. Stable bilateral hydroureteronephrosis. 2. Stable mucosal thickening of the renal pelves and bilateral ureters, as well as diffuse bladder wall thickening and perivesicular fat stranding, concerning for cystitis and urinary tract infection. 3. Bilateral pleural effusions, right greater than left, increased since prior exam. 4.  Aortic Atherosclerosis (ICD10-I70.0). Electronically Signed   By: Ozell Daring M.D.   On: 03/28/2024 18:12   MR BRAIN WO CONTRAST Result Date: 03/14/2024 EXAM: MRI BRAIN WITHOUT CONTRAST 03/14/2024 07:25:27 PM TECHNIQUE: Multiplanar multisequence MRI of the head/brain was performed without the administration of intravenous contrast. COMPARISON: 01/05/2023 CLINICAL HISTORY: Neuro deficit, acute, stroke suspected; dyarthria. FINDINGS: BRAIN AND VENTRICLES: No acute infarct. No intracranial hemorrhage. No mass. No midline shift. No hydrocephalus. The sella is unremarkable. Normal flow voids. Multifocal hyperintense T2-weighted signal within the cerebral white matter, most commonly due to chronic small vessel disease. Mild volume loss old left thalamic small vessel infarct. ORBITS: No acute  abnormality. SINUSES AND MASTOIDS: No acute abnormality. BONES AND SOFT TISSUES: Normal marrow signal. No acute soft tissue abnormality. IMPRESSION: 1. No acute intracranial abnormality. 2. Multifocal hyperintense T2-weighted signal within the cerebral white matter, most commonly due to chronic small vessel disease. 3. 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 4. Mild volume loss. Electronically signed by: Franky Stanford MD 03/14/2024 10:57 PM EDT RP Workstation: [id number]   US  Renal Result Date: 03/10/2024 EXAM: US  Retroperitoneum Complete, Renal. CLINICAL HISTORY: acute kidney failure. TECHNIQUE: Real-time ultrasound of the retroperitoneum (complete) with image documentation. COMPARISON: CT dated 03/10/2024. FINDINGS: RIGHT KIDNEY: Right kidney measures 10.2 x 5.5 x 5.4 cm. Volume is 161 cc. Moderate hydroureteronephrosis, as on CT. No renal stone or mass visualized. LEFT KIDNEY: Left kidney measures 10.6 x 5.9 x 4.6 cm. Volume is 154 cc. Moderate hydroureteronephrosis, as on  CT. No renal stone or mass visualized. BLADDER: Mild bladder wall thickening. Prevoid Bladder Volume of 42 cc. IMPRESSION: 1. Moderate bilateral hydroureteronephrosis. 2. Mild bladder wall thickening, likely due to bladder outlet obstruction. Electronically signed by: Rockey Kilts MD 03/10/2024 05:17 PM EDT RP Workstation: [id number]   CT Renal Stone Study Result Date: 03/10/2024 EXAM: CT UROGRAM 03/10/2024 03:58:52 PM TECHNIQUE: CT of the abdomen and pelvis was performed without the administration of intravenous contrast. Multiplanar reformatted images as well as MIP urogram images are provided for review. Automated exposure control, iterative reconstruction, and/or weight based adjustment of the mA/kV was utilized to reduce the radiation dose to as low as reasonably achievable. COMPARISON: 01/07/2024 CLINICAL HISTORY: acute kidney failure. Pt brb EMS from [facility] for Abnormal Labs. All V/S WDL. No other complaints at this time.  FINDINGS: LOWER CHEST: Small bilateral pleural effusions, right greater than left. Minimal dependent lower lobe atelectasis. LIVER: The liver is unremarkable. GALLBLADDER AND BILE DUCTS: Gallbladder is unremarkable. No biliary ductal dilatation. SPLEEN: No acute abnormality. PANCREAS: No acute abnormality. ADRENAL GLANDS: No acute abnormality. KIDNEYS, URETERS AND BLADDER: There is continued bilateral hydroureteronephrosis unchanged since prior exam. Stable bilateral mucosal thickening throughout the renal pelvis and bilateral ureters. Persistent bladder wall thickening with multiple bladder diverticula, consistent with sequela of bladder outlet obstruction. Perivesicular fat stranding could reflect superimposed cystitis. No stones in the kidneys or ureters. No perinephric or periureteral stranding. GI AND BOWEL: Stomach demonstrates no acute abnormality. Sigmoid diverticulosis without evidence of acute diverticulitis. Appendix is surgically absent. There is no bowel obstruction. PERITONEUM AND RETROPERITONEUM: No ascites. No free air. VASCULATURE: Aorta is normal in caliber. Aortic atherosclerosis. LYMPH NODES: No lymphadenopathy. REPRODUCTIVE ORGANS: No acute abnormality. BONES AND SOFT TISSUES: No acute osseous abnormality. No focal soft tissue abnormality. IMPRESSION: 1. Findings compatible with bladder outlet obstruction, with persistent bladder wall thickening and stable bilateral hydroureteronephrosis. Perivesicular Fat stranding and uroepithelial mural thickening consistent with cystitis and urinary tract infection. Please correlate with urinalysis. 2. No urinary tract calculi. 3. Sigmoid diverticulosis without diverticulitis. Electronically signed by: Ozell Daring MD 03/10/2024 04:05 PM EDT RP Workstation: [id number]    Microbiology: Results for orders placed or performed during the hospital encounter of 03/28/24  Urine Culture     Status: None   Collection Time: 03/28/24  7:20 PM   Specimen: Urine,  Random  Result Value Ref Range Status   Specimen Description   Final    URINE, RANDOM Performed at [facility], [address] [place], [place] [address]    Special Requests   Final    NONE Reflexed from [id number] Performed at [facility], [address], [place], [place] [address]    Culture   Final    NO GROWTH Performed at [facility], [address], [place], [place] [address]    Report Status 03/30/2024 FINAL  Final  Culture, body fluid w Gram Stain-bottle     Status: None   Collection Time: 03/30/24  9:05 AM   Specimen: Pleura  Result Value Ref Range Status   Specimen Description PLEURAL RIGHT  Final   Special Requests   Final    BOTTLES DRAWN AEROBIC AND ANAEROBIC Blood Culture adequate volume   Culture   Final    NO GROWTH 5 DAYS Performed at [facility], [address] [place], [place] [address]    Report Status 04/04/2024 FINAL  Final  Gram stain     Status: None   Collection Time: 03/30/24  9:05 AM  Specimen: Pleura  Result Value Ref Range Status   Specimen Description PLEURAL  Final   Special Requests NONE  Final   Gram Stain   Final    CYTOSPIN SMEAR NO ORGANISMS SEEN WBC PRESENT,BOTH PMN AND MONONUCLEAR Performed at [facility], [address], [place], [place] [address]    Report Status 03/30/2024 FINAL  Final  Aerobic/Anaerobic Culture w Gram Stain (surgical/deep wound)     Status: None (Preliminary result)   Collection Time: 04/01/24 10:40 AM   Specimen: Kidney  Result Value Ref Range Status   Specimen Description KIDNEY  Final   Special Requests LEFT  Final   Gram Stain   Final    ABUNDANT WBC PRESENT, PREDOMINANTLY PMN MODERATE YEAST    Culture   Final    ABUNDANT CANDIDA GLABRATA Sent to [facility] for further susceptibility testing. NO ANAEROBES ISOLATED Performed at [facility], [address] [place], [place] [address]    Report Status PENDING  Incomplete  Body fluid culture w Gram Stain     Status: None   Collection Time:  04/01/24 11:06 AM   Specimen: Kidney; Body Fluid  Result Value Ref Range Status   Specimen Description KIDNEY  Final   Special Requests RIGHT RENAL  Final   Gram Stain   Final    FEW WBC PRESENT, PREDOMINANTLY PMN RARE BUDDING YEAST SEEN Performed at [facility], [address], [place], [place] [address]    Culture FEW JANIS DEEMS  Final   Report Status 04/04/2024 FINAL  Final  MRSA Next Gen by PCR, Nasal     Status: Abnormal   Collection Time: 04/01/24 11:01 PM   Specimen: Nasal Mucosa; Nasal Swab  Result Value Ref Range Status   MRSA by PCR Next Gen DETECTED (A) NOT DETECTED Final    Comment: RESULT CALLED TO, READ BACK BY AND VERIFIED WITH: G ROOS AT 1248 ON [id number] BY S DALTON (NOTE) The GeneXpert MRSA Assay (FDA approved for NASAL specimens only), is one component of a comprehensive MRSA colonization surveillance program. It is not intended to diagnose MRSA infection nor to guide or monitor treatment for MRSA infections. Test performance is not FDA approved in patients less than 62 years old. Performed at [facility], [address], [place], [place] [address]   Culture, blood (Routine X 2) w Reflex to ID Panel     Status: None   Collection Time: 04/02/24 12:30 AM   Specimen: Right Antecubital; Blood  Result Value Ref Range Status   Specimen Description RIGHT ANTECUBITAL  Final   Special Requests   Final    BOTTLES DRAWN AEROBIC AND ANAEROBIC Blood Culture adequate volume   Culture   Final    NO GROWTH 5 DAYS Performed at [facility], [address], [place], [place] [address]    Report Status 04/07/2024 FINAL  Final  Culture, blood (Routine X 2) w Reflex to ID Panel     Status: None   Collection Time: 04/02/24 12:40 AM   Specimen: BLOOD RIGHT HAND  Result Value Ref Range Status   Specimen Description BLOOD RIGHT HAND  Final   Special Requests   Final    BOTTLES DRAWN AEROBIC AND ANAEROBIC Blood Culture adequate volume   Culture   Final    NO GROWTH 5  DAYS Performed at [facility], [address], [place], [place] [address]    Report Status 04/07/2024 FINAL  Final  Fungus culture, blood     Status:  None (Preliminary result)   Collection Time: 04/02/24 11:39 AM   Specimen: BLOOD  Result Value Ref Range Status   Specimen Description BLOOD PICC LINE  Final   Special Requests   Final    BOTTLES DRAWN AEROBIC AND ANAEROBIC Blood Culture adequate volume Performed at [facility], [address] [place], [place] [address]    Culture PENDING  Incomplete   Report Status PENDING  Incomplete  Yeast Susceptibilities     Status: None   Collection Time: 04/04/24 11:27 AM  Result Value Ref Range Status   SOURCE [id number] CANDIDA GLABRATA SENSI NEPHFRSTOMY TUBE  Corrected    Comment: Performed at [facility], [address] [place], [place] [address] CORRECTED ON 10/27 AT 1323: PREVIOUSLY REPORTED AS yeast sensi    Organism ID, Yeast Preliminary report  Final    Comment: (NOTE) Specimen has been received and testing has been initiated. Performed At: [facility] [address] [place], [place] [id number] Jennette Shorter MD Ey:[id number]    Amphotericin B MIC PENDING  Incomplete   Anidulafungin MIC PENDING  Incomplete   Caspofungin MIC PENDING  Incomplete   Fluconazole Islt MIC PENDING  Incomplete   ISAVUCONAZOLE MIC PENDING  Incomplete   Itraconazole MIC PENDING  Incomplete   Micafungin MIC PENDING  Incomplete   Posaconazole MIC PENDING  Incomplete   REZAFUNGIN MIC PENDING  Incomplete   Voriconazole MIC PENDING  Incomplete    Labs: CBC: Recent Labs  Lab 04/04/24 0347 04/05/24 0400 04/06/24 0402 04/07/24 0355 04/08/24 0400  WBC 7.5 8.3 7.9 6.8 7.2  HGB 6.9* 8.5* 10.0* 9.6* 9.5*  HCT 22.1* 26.9* 31.0* 30.2* 30.7*  MCV 98.2 93.7 93.7 95.3 93.9  PLT 309 309 353 371 395   Basic Metabolic Panel: Recent Labs  Lab 04/03/24 0518 04/04/24 0347 04/05/24 0400 04/06/24 0402 04/07/24 0355 04/08/24 0400  04/09/24 0316  NA 137 138  138 139 141 138 139 138  K 3.5 3.2*  3.2* 3.7 3.2* 3.5 3.7 3.5  CL 106 106  106 106 104 103 102 100  CO2 21* 23  22 26 28 27 28 25   GLUCOSE 96 83  85 91 101* 101* 101* 116*  BUN 24* 18  19 14 11 10 9 8   CREATININE 1.86* 1.57*  1.57* 1.39* 1.29* 1.24* 1.38* 1.41*  CALCIUM  7.8* 7.4*  7.5* 7.6* 8.1* 8.0* 8.2* 8.3*  MG 1.7 1.8 1.7 1.7 1.7 1.7  --   PHOS 2.8 2.6 2.5 2.4* 2.6  --   --    Liver Function Tests: Recent Labs  Lab 04/03/24 0518 04/04/24 0347 04/05/24 0400 04/06/24 0402 04/07/24 0355  ALBUMIN 2.4* 2.3* 2.5* 2.7* 2.7*   CBG: No results for input(s): GLUCAP in the last 168 hours.  Discharge time spent: greater than 30 minutes.  Signed: Adriana DELENA Grams, MD [facility] 04/09/2024

## 2024-04-09 NOTE — Plan of Care (Signed)
  Problem: Health Behavior/Discharge Planning: Goal: Ability to manage health-related needs will improve Outcome: Progressing   Problem: Clinical Measurements: Goal: Ability to maintain clinical measurements within normal limits will improve Outcome: Progressing Goal: Will remain free from infection Outcome: Progressing   Problem: Activity: Goal: Risk for activity intolerance will decrease Outcome: Progressing   Problem: Activity: Goal: Risk for activity intolerance will decrease Outcome: Progressing   Problem: Coping: Goal: Level of anxiety will decrease Outcome: Progressing   Problem: Pain Managment: Goal: General experience of comfort will improve and/or be controlled Outcome: Progressing   Problem: Safety: Goal: Ability to remain free from injury will improve Outcome: Progressing   Problem: Skin Integrity: Goal: Risk for impaired skin integrity will decrease Outcome: Progressing   Problem: Education: Goal: Knowledge of disease and its progression will improve Outcome: Progressing   Problem: Health Behavior/Discharge Planning: Goal: Ability to manage health-related needs will improve Outcome: Progressing

## 2024-04-09 NOTE — TOC Transition Note (Addendum)
 Transition of Care [facility]) - Discharge Note   Patient Details  Name: Robin Blackburn MRN: [id number] Date of Birth: Jul 24, 1945  [facility] CM/SW Contact:  Sharlyne Stabs, RN Phone Number: 04/09/2024, 11:31 AM   Clinical Narrative:   Patient is medically ready to return to [facility]. Debbie provided room number, RN calling report. IPCM will schedule EMS. Attempted to reach POA, mail box full.   Addendum : POA called back, updated with DC plan.   Final next level of care: Skilled Nursing Facility Barriers to Discharge: Barriers Resolved   Patient Goals and CMS Choice Patient states their goals for this hospitalization and ongoing recovery are:: return back to [facility] CMS Medicare.gov Compare Post Acute Care list provided to:: Patient Choice offered to / list presented to : Patient     Discharge Placement               Patient to be transferred to facility by: EMS   Patient and family notified of of transfer: 04/09/24  Discharge Plan and Services Additional resources added to the After Visit Summary for       Post Acute Care Choice: Skilled Nursing Facility               Social Drivers of Health (SDOH) Interventions SDOH Screenings   Food Insecurity: Patient Declined (03/29/2024)  Housing: Low Risk  (03/29/2024)  Transportation Needs: No Transportation Needs (03/29/2024)  Utilities: Not At Risk (03/29/2024)  Alcohol  Screen: Low Risk  (01/06/2022)  Depression (PHQ2-9): Low Risk  (02/25/2024)  Financial Resource Strain: Low Risk  (01/06/2022)  Physical Activity: Inactive (11/07/2022)   Received from [facility]  Social Connections: Socially Isolated (03/29/2024)  Stress: No Stress Concern Present (01/06/2022)  Tobacco Use: Low Risk  (03/28/2024)     Readmission Risk Interventions    03/29/2024    9:23 AM 03/11/2024    8:52 AM 01/09/2024   11:45 AM  Readmission Risk Prevention Plan  Transportation Screening Complete Complete Complete  Home Care  Screening   Complete  Medication Review (RN CM)   Complete  Medication Review Oceanographer) Complete Complete   HRI or Home Care Consult Complete Complete   SW Recovery Care/Counseling Consult Complete Complete   Palliative Care Screening Not Applicable Not Applicable   Skilled Nursing Facility Complete Complete

## 2024-04-10 LAB — YEAST SUSCEPTIBILITIES
Amphotericin B MIC: 0.25
Fluconazole Islt MIC: 4
ISAVUCONAZOLE MIC: 0.03
Itraconazole MIC: 0.25
Posaconazole MIC: 0.25
Voriconazole MIC: 0.06

## 2024-04-11 DIAGNOSIS — Z452 Encounter for adjustment and management of vascular access device: Secondary | ICD-10-CM | POA: Diagnosis not present

## 2024-04-11 DIAGNOSIS — N390 Urinary tract infection, site not specified: Secondary | ICD-10-CM | POA: Diagnosis not present

## 2024-04-11 DIAGNOSIS — D649 Anemia, unspecified: Secondary | ICD-10-CM | POA: Diagnosis not present

## 2024-04-11 LAB — AEROBIC/ANAEROBIC CULTURE W GRAM STAIN (SURGICAL/DEEP WOUND)

## 2024-04-12 ENCOUNTER — Ambulatory Visit: Admitting: Registered Nurse

## 2024-04-12 DIAGNOSIS — I129 Hypertensive chronic kidney disease with stage 1 through stage 4 chronic kidney disease, or unspecified chronic kidney disease: Secondary | ICD-10-CM | POA: Diagnosis not present

## 2024-04-12 DIAGNOSIS — F32A Depression, unspecified: Secondary | ICD-10-CM | POA: Diagnosis not present

## 2024-04-12 DIAGNOSIS — G4700 Insomnia, unspecified: Secondary | ICD-10-CM | POA: Diagnosis not present

## 2024-04-12 DIAGNOSIS — N179 Acute kidney failure, unspecified: Secondary | ICD-10-CM | POA: Diagnosis not present

## 2024-04-12 DIAGNOSIS — N139 Obstructive and reflux uropathy, unspecified: Secondary | ICD-10-CM | POA: Diagnosis not present

## 2024-04-12 DIAGNOSIS — F331 Major depressive disorder, recurrent, moderate: Secondary | ICD-10-CM | POA: Diagnosis not present

## 2024-04-12 DIAGNOSIS — F13232 Sedative, hypnotic or anxiolytic dependence with withdrawal with perceptual disturbance: Secondary | ICD-10-CM | POA: Diagnosis not present

## 2024-04-12 DIAGNOSIS — F419 Anxiety disorder, unspecified: Secondary | ICD-10-CM | POA: Diagnosis not present

## 2024-04-12 DIAGNOSIS — M069 Rheumatoid arthritis, unspecified: Secondary | ICD-10-CM | POA: Diagnosis not present

## 2024-04-12 DIAGNOSIS — G894 Chronic pain syndrome: Secondary | ICD-10-CM | POA: Diagnosis not present

## 2024-04-13 ENCOUNTER — Ambulatory Visit: Payer: Self-pay | Admitting: Internal Medicine

## 2024-04-13 DIAGNOSIS — D631 Anemia in chronic kidney disease: Secondary | ICD-10-CM | POA: Diagnosis not present

## 2024-04-13 DIAGNOSIS — N179 Acute kidney failure, unspecified: Secondary | ICD-10-CM | POA: Diagnosis not present

## 2024-04-13 DIAGNOSIS — M069 Rheumatoid arthritis, unspecified: Secondary | ICD-10-CM | POA: Diagnosis not present

## 2024-04-13 DIAGNOSIS — I129 Hypertensive chronic kidney disease with stage 1 through stage 4 chronic kidney disease, or unspecified chronic kidney disease: Secondary | ICD-10-CM | POA: Diagnosis not present

## 2024-04-14 LAB — MISC LABCORP TEST (SEND OUT): Labcorp test code: 9985

## 2024-04-18 ENCOUNTER — Ambulatory Visit: Payer: Self-pay | Admitting: Internal Medicine

## 2024-04-18 DIAGNOSIS — D631 Anemia in chronic kidney disease: Secondary | ICD-10-CM | POA: Diagnosis not present

## 2024-04-18 DIAGNOSIS — R51 Headache with orthostatic component, not elsewhere classified: Secondary | ICD-10-CM | POA: Diagnosis not present

## 2024-04-18 DIAGNOSIS — N179 Acute kidney failure, unspecified: Secondary | ICD-10-CM | POA: Diagnosis not present

## 2024-04-18 DIAGNOSIS — I129 Hypertensive chronic kidney disease with stage 1 through stage 4 chronic kidney disease, or unspecified chronic kidney disease: Secondary | ICD-10-CM | POA: Diagnosis not present

## 2024-04-18 DIAGNOSIS — N390 Urinary tract infection, site not specified: Secondary | ICD-10-CM | POA: Diagnosis not present

## 2024-04-18 DIAGNOSIS — Z452 Encounter for adjustment and management of vascular access device: Secondary | ICD-10-CM | POA: Diagnosis not present

## 2024-04-18 DIAGNOSIS — M069 Rheumatoid arthritis, unspecified: Secondary | ICD-10-CM | POA: Diagnosis not present

## 2024-04-19 DIAGNOSIS — I129 Hypertensive chronic kidney disease with stage 1 through stage 4 chronic kidney disease, or unspecified chronic kidney disease: Secondary | ICD-10-CM | POA: Diagnosis not present

## 2024-04-19 DIAGNOSIS — D631 Anemia in chronic kidney disease: Secondary | ICD-10-CM | POA: Diagnosis not present

## 2024-04-19 DIAGNOSIS — N179 Acute kidney failure, unspecified: Secondary | ICD-10-CM | POA: Diagnosis not present

## 2024-04-19 DIAGNOSIS — M069 Rheumatoid arthritis, unspecified: Secondary | ICD-10-CM | POA: Diagnosis not present

## 2024-04-20 DIAGNOSIS — Z713 Dietary counseling and surveillance: Secondary | ICD-10-CM | POA: Diagnosis not present

## 2024-04-20 DIAGNOSIS — N184 Chronic kidney disease, stage 4 (severe): Secondary | ICD-10-CM | POA: Diagnosis not present

## 2024-04-20 DIAGNOSIS — G894 Chronic pain syndrome: Secondary | ICD-10-CM | POA: Diagnosis not present

## 2024-04-20 DIAGNOSIS — I5042 Chronic combined systolic (congestive) and diastolic (congestive) heart failure: Secondary | ICD-10-CM | POA: Diagnosis not present

## 2024-04-20 DIAGNOSIS — Z6827 Body mass index (BMI) 27.0-27.9, adult: Secondary | ICD-10-CM | POA: Diagnosis not present

## 2024-04-20 DIAGNOSIS — I130 Hypertensive heart and chronic kidney disease with heart failure and stage 1 through stage 4 chronic kidney disease, or unspecified chronic kidney disease: Secondary | ICD-10-CM | POA: Diagnosis not present

## 2024-04-20 DIAGNOSIS — F411 Generalized anxiety disorder: Secondary | ICD-10-CM | POA: Diagnosis not present

## 2024-04-21 DIAGNOSIS — E871 Hypo-osmolality and hyponatremia: Secondary | ICD-10-CM | POA: Diagnosis not present

## 2024-04-21 DIAGNOSIS — M175 Other unilateral secondary osteoarthritis of knee: Secondary | ICD-10-CM | POA: Diagnosis not present

## 2024-04-21 DIAGNOSIS — K589 Irritable bowel syndrome without diarrhea: Secondary | ICD-10-CM | POA: Diagnosis not present

## 2024-04-21 DIAGNOSIS — M069 Rheumatoid arthritis, unspecified: Secondary | ICD-10-CM | POA: Diagnosis not present

## 2024-04-22 ENCOUNTER — Emergency Department (HOSPITAL_COMMUNITY)

## 2024-04-22 ENCOUNTER — Emergency Department (HOSPITAL_COMMUNITY)
Admission: EM | Admit: 2024-04-22 | Discharge: 2024-04-23 | Disposition: A | Discharge status: Home / Self Care | Source: Skilled Nursing Facility | Attending: Emergency Medicine | Admitting: Emergency Medicine

## 2024-04-22 ENCOUNTER — Emergency Department (HOSPITAL_COMMUNITY)
Admit: 2024-04-22 | Discharge: 2024-04-22 | Disposition: A | Discharge status: Home / Self Care | Source: Other Acute Inpatient Hospital | Attending: Emergency Medicine | Admitting: Emergency Medicine

## 2024-04-22 ENCOUNTER — Other Ambulatory Visit: Payer: Self-pay

## 2024-04-22 DIAGNOSIS — N185 Chronic kidney disease, stage 5: Secondary | ICD-10-CM | POA: Diagnosis not present

## 2024-04-22 DIAGNOSIS — S0990XA Unspecified injury of head, initial encounter: Secondary | ICD-10-CM | POA: Diagnosis present

## 2024-04-22 DIAGNOSIS — Y732 Prosthetic and other implants, materials and accessory gastroenterology and urology devices associated with adverse incidents: Secondary | ICD-10-CM | POA: Diagnosis not present

## 2024-04-22 DIAGNOSIS — M069 Rheumatoid arthritis, unspecified: Secondary | ICD-10-CM | POA: Diagnosis not present

## 2024-04-22 DIAGNOSIS — S0083XA Contusion of other part of head, initial encounter: Secondary | ICD-10-CM | POA: Diagnosis not present

## 2024-04-22 DIAGNOSIS — N132 Hydronephrosis with renal and ureteral calculous obstruction: Secondary | ICD-10-CM | POA: Insufficient documentation

## 2024-04-22 DIAGNOSIS — N1339 Other hydronephrosis: Secondary | ICD-10-CM | POA: Diagnosis not present

## 2024-04-22 DIAGNOSIS — N1832 Chronic kidney disease, stage 3b: Secondary | ICD-10-CM | POA: Diagnosis not present

## 2024-04-22 DIAGNOSIS — I6789 Other cerebrovascular disease: Secondary | ICD-10-CM | POA: Diagnosis not present

## 2024-04-22 DIAGNOSIS — N1330 Unspecified hydronephrosis: Secondary | ICD-10-CM | POA: Diagnosis not present

## 2024-04-22 DIAGNOSIS — Z79899 Other long term (current) drug therapy: Secondary | ICD-10-CM | POA: Insufficient documentation

## 2024-04-22 DIAGNOSIS — J320 Chronic maxillary sinusitis: Secondary | ICD-10-CM | POA: Insufficient documentation

## 2024-04-22 DIAGNOSIS — N179 Acute kidney failure, unspecified: Secondary | ICD-10-CM | POA: Diagnosis not present

## 2024-04-22 DIAGNOSIS — I6782 Cerebral ischemia: Secondary | ICD-10-CM | POA: Diagnosis not present

## 2024-04-22 DIAGNOSIS — I129 Hypertensive chronic kidney disease with stage 1 through stage 4 chronic kidney disease, or unspecified chronic kidney disease: Secondary | ICD-10-CM | POA: Diagnosis not present

## 2024-04-22 DIAGNOSIS — Z7401 Bed confinement status: Secondary | ICD-10-CM | POA: Diagnosis not present

## 2024-04-22 DIAGNOSIS — T83022A Displacement of nephrostomy catheter, initial encounter: Secondary | ICD-10-CM | POA: Diagnosis present

## 2024-04-22 DIAGNOSIS — T85698A Other mechanical complication of other specified internal prosthetic devices, implants and grafts, initial encounter: Secondary | ICD-10-CM | POA: Diagnosis not present

## 2024-04-22 DIAGNOSIS — M549 Dorsalgia, unspecified: Secondary | ICD-10-CM | POA: Diagnosis not present

## 2024-04-22 HISTORY — PX: IR NEPHROSTOMY PLACEMENT LEFT: IMG6063

## 2024-04-22 LAB — CBC WITH DIFFERENTIAL/PLATELET
Abs Immature Granulocytes: 0.07 K/uL (ref 0.00–0.07)
Basophils Absolute: 0.2 K/uL — ABNORMAL HIGH (ref 0.0–0.1)
Basophils Relative: 1 %
Eosinophils Absolute: 0.1 K/uL (ref 0.0–0.5)
Eosinophils Relative: 1 %
HCT: 32.9 % — ABNORMAL LOW (ref 36.0–46.0)
Hemoglobin: 10.5 g/dL — ABNORMAL LOW (ref 12.0–15.0)
Immature Granulocytes: 1 %
Lymphocytes Relative: 17 %
Lymphs Abs: 2.5 K/uL (ref 0.7–4.0)
MCH: 30 pg (ref 26.0–34.0)
MCHC: 31.9 g/dL (ref 30.0–36.0)
MCV: 94 fL (ref 80.0–100.0)
Monocytes Absolute: 1.1 K/uL — ABNORMAL HIGH (ref 0.1–1.0)
Monocytes Relative: 7 %
Neutro Abs: 11.4 K/uL — ABNORMAL HIGH (ref 1.7–7.7)
Neutrophils Relative %: 73 %
Platelets: 543 K/uL — ABNORMAL HIGH (ref 150–400)
RBC: 3.5 MIL/uL — ABNORMAL LOW (ref 3.87–5.11)
RDW: 15.9 % — ABNORMAL HIGH (ref 11.5–15.5)
WBC: 15.3 K/uL — ABNORMAL HIGH (ref 4.0–10.5)
nRBC: 0 % (ref 0.0–0.2)

## 2024-04-22 MED ORDER — IOHEXOL 300 MG/ML  SOLN
50.0000 mL | Freq: Once | INTRAMUSCULAR | Status: AC | PRN
  Administered 2024-04-22: 10 mL

## 2024-04-22 MED ORDER — ONDANSETRON 4 MG PO TBDP
4.0000 mg | ORAL_TABLET | Freq: Once | ORAL | Status: AC
  Administered 2024-04-22: 4 mg via ORAL
  Filled 2024-04-22: qty 1

## 2024-04-22 MED ORDER — HYDROCODONE-ACETAMINOPHEN 5-325 MG PO TABS
1.0000 | ORAL_TABLET | Freq: Once | ORAL | Status: DC
  Filled 2024-04-22: qty 1

## 2024-04-22 MED ORDER — LIDOCAINE-EPINEPHRINE 1 %-1:100000 IJ SOLN
20.0000 mL | Freq: Once | INTRAMUSCULAR | Status: AC
  Administered 2024-04-22: 10 mL via INTRADERMAL

## 2024-04-22 MED ORDER — MIDAZOLAM HCL (PF) 2 MG/2ML IJ SOLN
INTRAMUSCULAR | Status: AC | PRN
  Administered 2024-04-22: .5 mg via INTRAVENOUS

## 2024-04-22 MED ORDER — DIPHENHYDRAMINE HCL 50 MG/ML IJ SOLN
INTRAMUSCULAR | Status: AC | PRN
  Administered 2024-04-22 (×2): 25 mg via INTRAVENOUS

## 2024-04-22 MED ORDER — MIDAZOLAM HCL 2 MG/2ML IJ SOLN
INTRAMUSCULAR | Status: AC
  Filled 2024-04-22: qty 2

## 2024-04-22 MED ORDER — DIPHENHYDRAMINE HCL 50 MG/ML IJ SOLN
INTRAMUSCULAR | Status: AC
  Filled 2024-04-22: qty 1

## 2024-04-22 MED ORDER — ACETAMINOPHEN 500 MG PO TABS
1000.0000 mg | ORAL_TABLET | Freq: Once | ORAL | Status: AC
  Administered 2024-04-22: 1000 mg via ORAL
  Filled 2024-04-22: qty 2

## 2024-04-22 MED ORDER — LIDOCAINE-EPINEPHRINE 1 %-1:100000 IJ SOLN
INTRAMUSCULAR | Status: AC
  Filled 2024-04-22: qty 1

## 2024-04-22 MED ORDER — SODIUM CHLORIDE 0.9 % IV SOLN
INTRAVENOUS | Status: AC
  Filled 2024-04-22: qty 20

## 2024-04-22 MED ORDER — TRAMADOL HCL 50 MG PO TABS
50.0000 mg | ORAL_TABLET | Freq: Once | ORAL | Status: AC
  Administered 2024-04-22: 50 mg via ORAL
  Filled 2024-04-22: qty 1

## 2024-04-22 NOTE — ED Notes (Signed)
 Pt is with [facility] at this time. Family member is aware of pt going to [place]. Report has been called to the nurse at [facility].

## 2024-04-22 NOTE — ED Notes (Signed)
 Pt returned from [facility]. Little knot on right side of head. ED doc will assess.

## 2024-04-22 NOTE — Consult Note (Signed)
 Chief Complaint: Patient was seen in consultation today for left hydronephrosis, dislodged left PCN  Referring Physician(s): Suzette Pac, MD  Supervising Physician: Vanice Revel  Patient Status: [facility] ED; transfer to [facility] for procedure  History of Present Illness: Robin Blackburn is a 78 y.o. female with a past medical history significant for anxiety, depression, fibromyalgia, rheumatoid arthritis, HTN, CVA, CKD V, bilateral hydronephrosis of unknown etiology s/p bilateral PCN placement in IR 04/01/24 who presented to the ED today due to dislodged left PCN. Robin Blackburn was admitted in October for worsening renal function and was found to have bilateral hydronephrosis of unknown etiology. Nephrology was consulted and recommendation was made for bilateral PCN placement which was performed successfully in IR on 04/01/24.  Robin Blackburn reports that she lives in a nursing facility and when she was being rolled last night to be changed she felt a pop near the site of her left nephrostomy tube and it was later discovered that the tube had become completely dislodged so she was sent to the ED. She continues to urinate via her urethra in amounts that are similar to before she had the PCNs, recently she has noticed more burning with urination and a darker color, no blood. She is concerned about only having a small amount of urine in the PCN bags but does say that they do consistently have urine in them - she is wondering if the right PCN is still functioning because of this. She is concerned about the tubes being dislodged again at her facility. She understands the need for replacement of left PCN and is agreeable to proceed.   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   Depression    Fibromyalgia    Hypertension    Neuropathy    Rheumatoid arthritis (HCC)    Stroke (HCC) 02/17/14    Past Surgical History:  Procedure  Laterality Date   ABDOMINAL HYSTERECTOMY     ANKLE RECONSTRUCTION     APPENDECTOMY     BACK SURGERY     CHOLECYSTECTOMY     IR NEPHROSTOMY PLACEMENT RIGHT  04/01/2024   KNEE SURGERY      Allergies: Cortisone, Fentanyl , Hydromorphone , Iodinated contrast media, Keflex  [cephalexin ], Meperidine, Morphine, Oxycodone -acetaminophen , Penicillins, Afluria preservative free [influenza virus vacc split pf], Aspirin , Codeine, Influenza virus vaccine, Meperidine hcl, Oxycodone  hcl, Shellfish allergy, Sulfa  antibiotics, Troleandomycin, Potassium chloride , Bisphosphonates, Ciprofloxacin , Levaquin [levofloxacin in d5w], Oxytetracycline, and Wheat  Medications: Prior to Admission medications   Medication Sig Start Date End Date Taking? Authorizing Provider  acetaminophen  (TYLENOL ) 500 MG tablet Take 1,000 mg by mouth 2 (two) times daily.   Yes [provider]  acetaminophen  (TYLENOL ) 500 MG tablet Take 500 mg by mouth every 8 (eight) hours as needed for moderate pain (pain score 4-6).   Yes [provider]  Adalimumab (HUMIRA, 2 PEN,) 40 MG/0.8ML PNKT Inject 40 mg into the skin every 14 (fourteen) days.   Yes [provider]  ALPRAZolam  (XANAX ) 0.5 MG tablet Take 1 tablet (0.5 mg total) by mouth 2 (two) times daily as needed for anxiety. 03/15/24  Yes Amin, Ankit C, MD  amLODipine  (NORVASC ) 2.5 MG tablet Take 2.5 mg by mouth daily. Hold for SBP<110 12/10/23  Yes [provider]  Cholecalciferol  5000 units TABS Take 1 tablet by mouth daily.    Yes [provider]  Cranberry 450 MG TABS Take  1 tablet by mouth in the morning and at bedtime. For UTI   Yes [provider]  cycloSPORINE  (RESTASIS ) 0.05 % ophthalmic emulsion Place 1 drop into both eyes 2 (two) times daily. Dry eyes   Yes [provider]  estradiol  (ESTRACE ) 0.1 MG/GM vaginal cream Place 1 Applicatorful vaginally every other day. Discard plastic applicator. Insert a blueberry size amount  (approximately 1 gram) of cream on fingertip inside vagina at bedtime every other night for long term use. 01/09/24  Yes [name], [name], MD  ferrous sulfate  325 (65 FE) MG EC tablet Take 1 tablet (325 mg total) by mouth daily with breakfast. 02/04/24  Yes Thayil, Irene T, PA-C  fluticasone (FLONASE) 50 MCG/ACT nasal spray Place 1 spray into both nostrils 2 (two) times daily.   Yes [provider]  folic acid  (FOLVITE ) 1 MG tablet Take 1 mg by mouth daily.   Yes [provider]  gabapentin  (NEURONTIN ) 300 MG capsule Take 1 capsule (300 mg total) by mouth 3 (three) times daily as needed (neuropathy pain symptoms). 01/09/24  Yes [name], [name], MD  lactobacillus acidophilus (BACID) TABS tablet Take 1 tablet by mouth daily.   Yes [provider]  lansoprazole (PREVACID) 30 MG capsule Take 60 mg by mouth 2 (two) times daily before a meal. DO NOT CHANGE PER MD   Yes [provider]  leflunomide  (ARAVA ) 10 MG tablet Take 10 mg by mouth daily. 08/01/20  Yes [provider]  Melatonin 10 MG TABS Take 10 mg by mouth at bedtime.   Yes [provider]  Menthol-Methyl Salicylate (MUSCLE RUB) 10-15 % CREA Apply 1 Application topically every 6 (six) hours as needed (Neck pain).   Yes [provider]  methenamine  (HIPREX ) 1 g tablet Take 1 tablet (1 g total) by mouth 2 (two) times daily. Prophylactic 01/22/24  Yes [name], [name], MD  metoprolol  tartrate (LOPRESSOR ) 25 MG tablet Take 0.5 tablets (12.5 mg total) by mouth 2 (two) times daily. 01/09/24  Yes [name], [name], MD  Multiple Vitamin (MULTIVITAMIN PO) Take 1 tablet by mouth 2 (two) times daily.   Yes [provider]  ondansetron  (ZOFRAN ) 4 MG tablet Take 4 mg by mouth every 8 (eight) hours as needed for vomiting or nausea. 02/13/24  Yes [provider]  senna (SENOKOT) 8.6 MG tablet Take 1 tablet by mouth 2 (two) times daily.   Yes [provider]  sodium  bicarbonate 650 MG tablet Take 1 tablet (650 mg total) by mouth 3 (three) times daily. 04/09/24 05/09/24 Yes Shahmehdi, Seyed A, MD  ticagrelor  (BRILINTA ) 60 MG TABS tablet Take 60 mg by mouth daily.   Yes [provider]  traMADol  (ULTRAM ) 50 MG tablet Take 1 tablet (50 mg total) by mouth every 8 (eight) hours as needed for severe pain (pain score 7-10). Patient taking differently: Take 50 mg by mouth every 6 (six) hours as needed for severe pain (pain score 7-10). 03/15/24  Yes Amin, Ankit C, MD  traZODone  (DESYREL ) 100 MG tablet Take 100 mg by mouth at bedtime. 02/25/23  Yes [provider]  vitamin B-12 (CYANOCOBALAMIN ) 250 MCG tablet Take 250 mcg by mouth daily.   Yes [provider]  Heparin  Sod, Pork, Lock Flush (HEPARIN  FLUSH) 10 UNIT/ML SOLN injection Inject 5 Units into the vein daily. Patient not taking: Reported on 04/22/2024 04/10/24   [provider]  Nutritional Supplement LIQD Take 120 mLs by mouth daily at 6 PM. House  supplement on [facility]    [provider]     Family History  Problem Relation Age of Onset   Breast cancer Mother    Hypertension Father    Transient ischemic attack Father    Lung cancer Father    Hypertension Brother    Leukemia Brother    Leukemia Brother    Leukemia Brother    CVA Maternal Grandfather     Social History   Socioeconomic History   Marital status: Divorced    Spouse name: Not on file   Number of children: 0   Years of education: 12   Highest education level: 12th grade  Occupational History   Not on file  Tobacco Use   Smoking status: Never    Passive exposure: Never   Smokeless tobacco: Never  Vaping Use   Vaping status: Never Used  Substance and Sexual Activity   Alcohol  use: No   Drug use: No   Sexual activity: Not Currently  Other Topics Concern   Not on file  Social History Narrative   Not on file   Social Drivers of Health   Financial Resource Strain: Low Risk  (01/06/2022)    Overall Financial Resource Strain (CARDIA)    Difficulty of Paying Living Expenses: Not hard at all  Food Insecurity: Patient Declined (03/29/2024)   Hunger Vital Sign    Worried About Running Out of Food in the Last Year: Patient declined    Ran Out of Food in the Last Year: Patient declined  Transportation Needs: No Transportation Needs (03/29/2024)   PRAPARE - Administrator, Civil Service (Medical): No    Lack of Transportation (Non-Medical): No  Physical Activity: Inactive (11/07/2022)   [facility]   Exercise Vital Sign    On average, how many minutes do you engage in exercise at this level?: 0 min    On average, how many days per week do you engage in moderate to strenuous exercise (like a brisk walk)?: 0 days  Stress: No Stress Concern Present (01/06/2022)   [employer] of Occupational Health - Occupational Stress Questionnaire    Feeling of Stress : Not at all  Social Connections: Socially Isolated (03/29/2024)   Social Connection and Isolation Panel    Frequency of Communication with Friends and Family: Never    Frequency of Social Gatherings with Friends and Family: Never    Attends Religious Services: Never    Database Administrator or Organizations: No    Attends Banker Meetings: Never    Marital Status: Widowed     Review of Systems: A 12 point ROS discussed and pertinent positives are indicated in the HPI above.  All other systems are negative.  Review of Systems  Constitutional:  Positive for fatigue. Negative for chills and fever.  Respiratory:  Negative for cough and shortness of breath.   Cardiovascular:  Negative for chest pain.  Gastrointestinal:  Positive for diarrhea and nausea. Negative for abdominal pain and blood in stool.  Genitourinary:  Positive for dysuria and flank pain (pressure on left). Negative for hematuria.  Neurological:  Negative for dizziness, syncope and headaches.    Vital Signs: BP (!)  145/68   Pulse 73   Temp 97.8 F (36.6 C) (Oral)   Resp 17   Ht 5' 6 (1.676 m)   Wt 169 lb 1.5 oz (76.7 kg)   SpO2 95%   BMI 27.29 kg/m   Physical Exam Vitals and  nursing note reviewed.  Constitutional:      General: She is not in acute distress. HENT:     Head: Normocephalic.  Cardiovascular:     Rate and Rhythm: Normal rate and regular rhythm.  Pulmonary:     Effort: Pulmonary effort is normal.     Breath sounds: Normal breath sounds.  Abdominal:     General: There is no distension.     Palpations: Abdomen is soft.     Tenderness: There is no abdominal tenderness.  Genitourinary:    Comments: Left PCN dislodged, previous insertion site covered with small amount of blood on dressing (+) right PCN in place draining cloudy yellow urine, ~10 mL in bag. PCN flushed easily without leakage. Dressing partially dislodged. Skin:    General: Skin is warm and dry.  Neurological:     Mental Status: She is alert and oriented to person, place, and time.  Psychiatric:        Mood and Affect: Mood normal.        Behavior: Behavior normal.        Thought Content: Thought content normal.        Judgment: Judgment normal.      MD Evaluation Airway: WNL Heart: WNL Abdomen: WNL Chest/ Lungs: WNL ASA  Classification: 3 Mallampati/Airway Score: Two   Imaging: US  RENAL Result Date: 04/22/2024 CLINICAL DATA:  Status post bilateral percutaneous nephrostomy tube placement on 04/01/2024. Left nephrostomy tube has become dislodged. EXAM: RENAL / URINARY TRACT ULTRASOUND COMPLETE COMPARISON:  Imaging during nephrostomy tube placement on 04/01/2024 FINDINGS: Right Kidney: Renal measurements: 8.5 x 5.2 x 5.2 cm = volume: 120 mL. Moderate hydronephrosis. Difficult to visualize a nephrostomy tube by ultrasound. No focal lesion. Left Kidney: Renal measurements: 9.0 x 5.8 x 4.6 cm = volume: 126 mL. Mild to moderate hydronephrosis. No nephrostomy tube visualized. No focal lesion. Bladder: Under  distended bladder with debris versus mass in the bladder lumen. Correlation suggested with urinalysis, presence of hematuria or presence of indwelling Foley catheter. Other: None. IMPRESSION: 1. Moderate right hydronephrosis. Difficult to visualize a nephrostomy tube by ultrasound. 2. Mild to moderate left hydronephrosis. No nephrostomy tube visualized. 3. Under distended bladder with debris versus mass in the bladder lumen. Correlation suggested with urinalysis, presence of hematuria or presence of indwelling Foley catheter. 4. CT of the abdomen and pelvis without contrast would be helpful in determining current location of right nephrostomy tube given the presence of hydronephrosis. Electronically Signed   By: Marcey Moan M.D.   On: 04/22/2024 12:50   DG CHEST PORT 1 VIEW Result Date: 04/02/2024 EXAM: 1 VIEW(S) XRAY OF THE CHEST 04/02/2024 01:12:00 AM COMPARISON: Chest x-ray 03/30/2024. CLINICAL HISTORY: Sepsis (HCC), fever, tachypnea. FINDINGS: LUNGS AND PLEURA: No focal pulmonary opacity. No pulmonary edema. No pleural effusion. No pneumothorax. HEART AND MEDIASTINUM: Heart is enlarged, unchanged. BONES AND SOFT TISSUES: No acute osseous abnormality. IMPRESSION: 1. No acute findings. 2. Enlarged heart, unchanged. Electronically signed by: Greig Pique MD 04/02/2024 01:14 AM EDT RP Workstation: [id number]   IR NEPHROSTOMY PLACEMENT BILATERAL Result Date: 04/01/2024 INDICATION: Obstructive uropathy and hydronephrosis. EXAM: Ultrasound and fluoroscopic guided percutaneous nephrostomy tube placement COMPARISON:  None Available. MEDICATIONS: 2 g IV Rocephin ; The antibiotic was administered in an appropriate time frame prior to skin puncture. CONTRAST:  3 mL Gadavist-administered into the collecting system(s) FLUOROSCOPY: Radiation Exposure Index (as provided by the fluoroscopic device): 13 mGy Kerma COMPLICATIONS: None immediate. PROCEDURE: Informed written consent was obtained from the patient after a  thorough discussion of the procedural risks, benefits and alternatives. All questions were addressed. Maximal Sterile Barrier Technique was utilized including caps, mask, sterile gowns, sterile gloves, sterile drape, hand hygiene and skin antiseptic. A timeout was performed prior to the initiation of the procedure. Bilateral flank regions were prepped and draped in usual sterile fashion. Local anesthesia was achieved by infiltrating subcutaneous tissue of the flank regions to the renal cortex with 1% lidocaine . A small incision was made in the left flank region and an Accustick needle was advanced under ultrasound guidance. The patient had hydronephrosis in the needle tip was directed to a dilated renal calyx. Once visualized within the calyx, the stylet was removed leaving the needle behind. Pus was obtained. A small volume of gadolinium was then injected under fluoroscopic guidance filling the calices and dilated renal pelvis. An 018 guidewire was then advanced into the proximal ureter. The Accustick sheath was then advanced over the guidewire into the proximal ureter. An 035 guidewire was then advanced through the sheath into the proximal ureter. The access site was dilated. A 10 French pigtail catheter was then advanced over the guidewire and coiled within the renal pelvis. A small sample of fluid was then collected and sent to pathology for culture and sensitivity. Accustick needle was then advanced through a small incision in the right flank region using ultrasound guidance. When the needle tip was identified within the dilated renal collecting system, the stylet was removed and again pus was obtained. A small volume of dilute gadolinium was injected in order to perform an antegrade pyelogram which demonstrated hydronephrosis and a dilated renal pelvis. Access was then exchanged for an 035 guidewire which was advanced into the proximal ureter. The access site was dilated and a 10 French pigtail catheter was  advanced over the guidewire. The pigtail catheters were locked in position. Retention sutures were applied bilaterally. Sterile dressing was applied bilaterally. Catheters were connected to gravity drainage. IMPRESSION: Bilateral 10 French percutaneous nephrostomy tubes placed as described above. Pus was removed from the collecting system bilaterally and sent to pathology for culture and sensitivity. Electronically Signed   By: Cordella Banner   On: 04/01/2024 12:41   US  THORACENTESIS ASP PLEURAL SPACE W/IMG GUIDE Result Date: 03/30/2024 INDICATION: 78 year old female. Admitted for abnormal lab value. Found to have a right-sided pleural effusion. Request is for therapeutic and diagnostic right sided thoracentesis EXAM: ULTRASOUND GUIDED THERAPEUTIC AND DIAGNOSTIC RIGHT-SIDED THORACENTESIS MEDICATIONS: Lidocaine  1% 10 mL COMPLICATIONS: None immediate. PROCEDURE: An ultrasound guided thoracentesis was thoroughly discussed with the patient and questions answered. The benefits, risks, alternatives and complications were also discussed. The patient understands and wishes to proceed with the procedure. Written consent was obtained. Ultrasound was performed to localize and mark an adequate pocket of fluid in the right chest. The area was then prepped and draped in the normal sterile fashion. 1% Lidocaine  was used for local anesthesia. Under ultrasound guidance a 6 Fr Safe-T-Centesis catheter was introduced. Thoracentesis was performed. The catheter was removed and a dressing applied. FINDINGS: A total of approximately 600 mL of pale yellow fluid was removed. Samples were sent to the laboratory as requested by the clinical team. IMPRESSION: Successful ultrasound guided therapeutic and diagnostic right-sided thoracentesis yielding 600 mL of pale yellow pleural fluid. Performed by Delon Beagle NP Electronically Signed   By: Ester Sides M.D.   On: 03/30/2024 11:14   DG Chest 1 View Result Date:  03/30/2024 CLINICAL DATA:  Post thoracentesis today. EXAM: CHEST  1 VIEW COMPARISON:  Chest x-ray 02/18/2024, CT 03/28/2024 FINDINGS: Left-sided PICC line is present with tip over the axillary vein. Patient is rotated to the left. Lungs are hypoinflated. No significant right-sided effusion post thoracentesis. No pneumothorax. Minimal patchy density right base likely atelectasis. Minimal stable tenting of the left hemidiaphragm. Left lung otherwise clear. Cardiomediastinal silhouette and remainder of the exam is unchanged. IMPRESSION: Hypoinflation with minimal patchy density right base likely atelectasis. No significant right-sided effusion post thoracentesis. No pneumothorax. Electronically Signed   By: Toribio Agreste M.D.   On: 03/30/2024 09:34   CT ABDOMEN PELVIS WO CONTRAST Result Date: 03/28/2024 CLINICAL DATA:  Acute abdominal pain EXAM: CT ABDOMEN AND PELVIS WITHOUT CONTRAST TECHNIQUE: Multidetector CT imaging of the abdomen and pelvis was performed following the standard protocol without IV contrast. RADIATION DOSE REDUCTION: This exam was performed according to the departmental dose-optimization program which includes automated exposure control, adjustment of the mA and/or kV according to patient size and/or use of iterative reconstruction technique. COMPARISON:  03/10/2024 FINDINGS: Lower chest: There are bilateral pleural effusions, right greater than left, increased since prior study. Compressive atelectasis within the lower lobes. Hepatobiliary: Cholecystectomy. Unremarkable unenhanced appearance of the liver. Pancreas: Unremarkable unenhanced appearance. Spleen: Unremarkable unenhanced appearance. Adrenals/Urinary Tract: The bladder is decompressed with a Foley catheter. Persistent bladder wall thickening and perivesicular fat stranding. Stable bilateral hydroureteronephrosis. Stable mucosal thickening of the renal pelves and ureters concerning for urinary tract infection. No urinary tract calculi.  The adrenals are unremarkable. Stomach/Bowel: No bowel obstruction or ileus. Distal colonic diverticulosis without diverticulitis. No bowel wall thickening or inflammatory change. Vascular/Lymphatic: Aortic atherosclerosis. No enlarged abdominal or pelvic lymph nodes. Reproductive: Status post hysterectomy. No adnexal masses. Other: No free fluid or free intraperitoneal gas. No abdominal wall hernia. Musculoskeletal: No acute or destructive bony abnormalities. Reconstructed images demonstrate no additional findings. IMPRESSION: 1. Stable bilateral hydroureteronephrosis. 2. Stable mucosal thickening of the renal pelves and bilateral ureters, as well as diffuse bladder wall thickening and perivesicular fat stranding, concerning for cystitis and urinary tract infection. 3. Bilateral pleural effusions, right greater than left, increased since prior exam. 4.  Aortic Atherosclerosis (ICD10-I70.0). Electronically Signed   By: Ozell Daring M.D.   On: 03/28/2024 18:12    Labs:  CBC: Recent Labs    04/05/24 0400 04/06/24 0402 04/07/24 0355 04/08/24 0400  WBC 8.3 7.9 6.8 7.2  HGB 8.5* 10.0* 9.6* 9.5*  HCT 26.9* 31.0* 30.2* 30.7*  PLT 309 353 371 395    COAGS: Recent Labs    04/01/24 0412  INR 1.8*    BMP: Recent Labs    04/06/24 0402 04/07/24 0355 04/08/24 0400 04/09/24 0316  NA 141 138 139 138  K 3.2* 3.5 3.7 3.5  CL 104 103 102 100  CO2 28 27 28 25   GLUCOSE 101* 101* 101* 116*  BUN 11 10 9 8   CALCIUM  8.1* 8.0* 8.2* 8.3*  CREATININE 1.29* 1.24* 1.38* 1.41*  GFRNONAA 42* 44* 39* 38*    LIVER FUNCTION TESTS: Recent Labs    03/10/24 1429 03/13/24 0440 03/28/24 1617 03/29/24 0529 04/01/24 0412 04/02/24 0030 04/02/24 0439 04/04/24 0347 04/05/24 0400 04/06/24 0402 04/07/24 0355  BILITOT 0.4  --  <0.2 <0.2  --  0.3  --   --   --   --   --   AST 14*  --  <10* <10*  --  13*  --   --   --   --   --   ALT <5  --  <5 <  5  --  <5  --   --   --   --   --   ALKPHOS 210*  --  143*  137*  --  113  --   --   --   --   --   PROT 7.4  --  6.0* 5.9*  --  6.0*  --   --   --   --   --   ALBUMIN 3.0*   < > 2.7* 2.6*   < > 2.7*   < > 2.3* 2.5* 2.7* 2.7*   < > = values in this interval not displayed.    TUMOR MARKERS: No results for input(s): AFPTM, CEA, CA199, CHROMGRNA in the last 8760 hours.  Assessment and Plan:  78 y/o F with history of CKD V, obstructive uropathy of unknown etiology with bilateral hydronephrosis s/p bilateral PCN placement 04/01/24 in IR who presented to the ED today due to dislodgement of left PCN.   On exam left PCN completely dislodged with dressing in place. Right PCN in tact, flushes easily without leakage from insertion site, small amount of cloudy urine in drainage bag - per patient minimal output from both PCNs since discharge, continues to urinate via urethra.   Plan: - Transfer to [facility] from [facility] ED for replacement of dislodged left PCN, return to [facility] post procedure - Remain NPO until post procedure, last PO intake was at 0800 today (banana and juice per patient) - Multiple allergies noted, including iodinated contrast and multiple pain medications. Per patient she tolerated whatever medications she was given for initial placement  Risks and benefits of left PCN placement was discussed with the patient including, but not limited to, infection, bleeding, significant bleeding causing loss or decrease in renal function or damage to adjacent structures.   All of the patient's questions were answered, patient is agreeable to proceed.  Consent signed and in chart.  Thank you for this interesting consult.  I greatly enjoyed meeting HSER BELANGER and look forward to participating in their care.  A copy of this report was sent to the requesting provider on this date.  Electronically Signed: Clotilda DELENA Hesselbach, PA-C 04/22/2024, 1:45 PM   I spent a total of 40 Minutes  in face to face in clinical consultation, greater than 50% of which was  counseling/coordinating care for dislodged left PCN.

## 2024-04-22 NOTE — ED Notes (Signed)
 Pt has been cleaned and changed due to BM. Clean brief applied and extra bedding pulled out. Pt has call light in reach and friend at bed side.

## 2024-04-22 NOTE — ED Notes (Signed)
 Pt stated her head hurt at a rate of 2 and side a rate of 6

## 2024-04-22 NOTE — ED Notes (Signed)
 Patient called out for pain meds. Edp notified

## 2024-04-22 NOTE — ED Provider Notes (Signed)
 [place] EMERGENCY DEPARTMENT AT [facility] Provider Note   CSN: [id number] Arrival date & time: 04/22/24  0945     Patient presents with: dislodged nephrostomy tube   Robin Blackburn is a 78 y.o. female.   Patient has bilateral nephrostomy tubes and recently was admitted to the hospital with urinary tract infection.  She also has stage 5 kidney disease but is not on dialysis.  Patient's left nephrostomy tube came out.  And she came here to see if she needed it placed back.  The history is provided by the patient and medical records. No language interpreter was used.  Abdominal Pain Pain location:  Generalized Pain quality: aching   Pain radiates to:  Does not radiate Pain severity:  Mild Timing:  Intermittent Progression:  Resolved Chronicity:  Recurrent Context: not alcohol  use   Relieved by:  Nothing Associated symptoms: no chest pain, no cough, no diarrhea, no fatigue and no hematuria        Prior to Admission medications   Medication Sig Start Date End Date Taking? Authorizing Provider  acetaminophen  (TYLENOL ) 500 MG tablet Take 1,000 mg by mouth 2 (two) times daily.   Yes [provider]  acetaminophen  (TYLENOL ) 500 MG tablet Take 500 mg by mouth every 8 (eight) hours as needed for moderate pain (pain score 4-6).   Yes [provider]  Adalimumab (HUMIRA, 2 PEN,) 40 MG/0.8ML PNKT Inject 40 mg into the skin every 14 (fourteen) days.   Yes [provider]  ALPRAZolam  (XANAX ) 0.5 MG tablet Take 1 tablet (0.5 mg total) by mouth 2 (two) times daily as needed for anxiety. 03/15/24  Yes Amin, Ankit C, MD  amLODipine  (NORVASC ) 2.5 MG tablet Take 2.5 mg by mouth daily. Hold for SBP<110 12/10/23  Yes [provider]  Cholecalciferol  5000 units TABS Take 1 tablet by mouth daily.    Yes [provider]  Cranberry 450 MG TABS Take 1 tablet by mouth in the morning and at bedtime. For UTI   Yes [provider]   cycloSPORINE  (RESTASIS ) 0.05 % ophthalmic emulsion Place 1 drop into both eyes 2 (two) times daily. Dry eyes   Yes [provider]  estradiol  (ESTRACE ) 0.1 MG/GM vaginal cream Place 1 Applicatorful vaginally every other day. Discard plastic applicator. Insert a blueberry size amount (approximately 1 gram) of cream on fingertip inside vagina at bedtime every other night for long term use. 01/09/24  Yes [name], [name], MD  ferrous sulfate  325 (65 FE) MG EC tablet Take 1 tablet (325 mg total) by mouth daily with breakfast. 02/04/24  Yes Thayil, Irene T, PA-C  fluticasone (FLONASE) 50 MCG/ACT nasal spray Place 1 spray into both nostrils 2 (two) times daily.   Yes [provider]  folic acid  (FOLVITE ) 1 MG tablet Take 1 mg by mouth daily.   Yes [provider]  gabapentin  (NEURONTIN ) 300 MG capsule Take 1 capsule (300 mg total) by mouth 3 (three) times daily as needed (neuropathy pain symptoms). 01/09/24  Yes [name], [name], MD  lactobacillus acidophilus (BACID) TABS tablet Take 1 tablet by mouth daily.   Yes [provider]  lansoprazole (PREVACID) 30 MG capsule Take 60 mg by mouth 2 (two) times daily before a meal. DO NOT CHANGE PER MD   Yes [provider]  leflunomide  (ARAVA ) 10 MG tablet Take 10 mg by mouth daily. 08/01/20  Yes [provider]  Melatonin 10 MG TABS Take 10 mg by mouth at  bedtime.   Yes [provider]  Menthol-Methyl Salicylate (MUSCLE RUB) 10-15 % CREA Apply 1 Application topically every 6 (six) hours as needed (Neck pain).   Yes [provider]  methenamine  (HIPREX ) 1 g tablet Take 1 tablet (1 g total) by mouth 2 (two) times daily. Prophylactic 01/22/24  Yes [name], [name], MD  metoprolol  tartrate (LOPRESSOR ) 25 MG tablet Take 0.5 tablets (12.5 mg total) by mouth 2 (two) times daily. 01/09/24  Yes [name], [name], MD  Multiple Vitamin (MULTIVITAMIN PO) Take 1 tablet by mouth 2 (two) times daily.   Yes  [provider]  ondansetron  (ZOFRAN ) 4 MG tablet Take 4 mg by mouth every 8 (eight) hours as needed for vomiting or nausea. 02/13/24  Yes [provider]  senna (SENOKOT) 8.6 MG tablet Take 1 tablet by mouth 2 (two) times daily.   Yes [provider]  sodium bicarbonate 650 MG tablet Take 1 tablet (650 mg total) by mouth 3 (three) times daily. 04/09/24 05/09/24 Yes Shahmehdi, Seyed A, MD  ticagrelor  (BRILINTA ) 60 MG TABS tablet Take 60 mg by mouth daily.   Yes [provider]  traMADol  (ULTRAM ) 50 MG tablet Take 1 tablet (50 mg total) by mouth every 8 (eight) hours as needed for severe pain (pain score 7-10). Patient taking differently: Take 50 mg by mouth every 6 (six) hours as needed for severe pain (pain score 7-10). 03/15/24  Yes Amin, Ankit C, MD  traZODone  (DESYREL ) 100 MG tablet Take 100 mg by mouth at bedtime. 02/25/23  Yes [provider]  vitamin B-12 (CYANOCOBALAMIN ) 250 MCG tablet Take 250 mcg by mouth daily.   Yes [provider]  Heparin  Sod, Pork, Lock Flush (HEPARIN  FLUSH) 10 UNIT/ML SOLN injection Inject 5 Units into the vein daily. Patient not taking: Reported on 04/22/2024 04/10/24   [provider]  Nutritional Supplement LIQD Take 120 mLs by mouth daily at 6 PM. House supplement on [facility]    [provider]    Allergies: Cortisone, Fentanyl , Hydromorphone , Iodinated contrast media, Keflex  [cephalexin ], Meperidine, Morphine, Oxycodone -acetaminophen , Penicillins, Afluria preservative free [influenza virus vacc split pf], Aspirin , Codeine, Influenza virus vaccine, Meperidine hcl, Oxycodone  hcl, Shellfish allergy, Sulfa  antibiotics, Troleandomycin, Potassium chloride , Bisphosphonates, Ciprofloxacin , Levaquin [levofloxacin in d5w], Oxytetracycline, and Wheat    Review of Systems  Constitutional:  Negative for appetite change and fatigue.  HENT:  Negative for congestion, ear discharge and sinus pressure.   Eyes:   Negative for discharge.  Respiratory:  Negative for cough.   Cardiovascular:  Negative for chest pain.  Gastrointestinal:  Positive for abdominal pain. Negative for diarrhea.  Genitourinary:  Negative for frequency and hematuria.  Musculoskeletal:  Negative for back pain.  Skin:  Negative for rash.  Neurological:  Negative for seizures and headaches.  Psychiatric/Behavioral:  Negative for hallucinations.     Updated Vital Signs BP (!) 151/76   Pulse 71   Temp 97.8 F (36.6 C) (Oral)   Resp 17   Ht 5' 6 (1.676 m)   Wt 76.7 kg   SpO2 95%   BMI 27.29 kg/m   Physical Exam Vitals and nursing note reviewed.  Constitutional:      Appearance: She is well-developed.  HENT:     Head: Normocephalic.     Nose: Nose normal.  Eyes:     General: No scleral icterus.    Conjunctiva/sclera: Conjunctivae normal.  Neck:     Thyroid : No thyromegaly.  Cardiovascular:     Rate and  Rhythm: Normal rate and regular rhythm.     Heart sounds: No murmur heard.    No friction rub. No gallop.  Pulmonary:     Breath sounds: No stridor. No wheezing or rales.  Chest:     Chest wall: No tenderness.  Abdominal:     General: There is no distension.     Tenderness: There is no abdominal tenderness. There is no rebound.  Genitourinary:    Comments: Left nephrostomy tube is no longer in place.  Right nephrostomy tube in place Musculoskeletal:        General: Normal range of motion.     Cervical back: Neck supple.  Lymphadenopathy:     Cervical: No cervical adenopathy.  Skin:    Findings: No erythema or rash.  Neurological:     Mental Status: She is alert and oriented to person, place, and time.     Motor: No abnormal muscle tone.     Coordination: Coordination normal.  Psychiatric:        Behavior: Behavior normal.     (all labs ordered are listed, but only abnormal results are displayed) Labs Reviewed - No data to display  EKG: None  Radiology: No results found.   Procedures    Medications Ordered in the ED  ondansetron  (ZOFRAN -ODT) disintegrating tablet 4 mg (4 mg Oral Given 04/22/24 1109)   I spoke with Dr. Marlee and he felt like the patient needed a new nephrostomy tube placed.  We got an ultrasound that showed she still has hydronephrosis and Dr. Vanice interventional radiologist agrees patient needs nephrostomy tube.  She will be transferred to the interventional radiology department at [facility] today and get a new nephrostomy tube.  She then will be transferred back to the emergency department here and will be discharged home to the nursing home                                 Medical Decision Making Risk Prescription drug management.   Nephrostomy tube fell out and will be replaced.  History of stage V kidney failure and multiple urinary tract infections with hydronephrosis     Final diagnoses:  None    ED Discharge Orders     None          Suzette Pac, MD 04/22/24 1243

## 2024-04-22 NOTE — ED Notes (Signed)
 Gave abdominal binder to  POA Still unable to get intouch with facility

## 2024-04-22 NOTE — ED Notes (Addendum)
 Left side nephrostomy not present on assessment. Dressing applied over area. Right nephrostomy present and dressing applied over it. Stitches are no longer attached to skin. PICC line to left arm noted.

## 2024-04-22 NOTE — ED Triage Notes (Signed)
 Pt BIB [facility] from [facility] with c/o that her left nephrostomy tube has come loose or dislodged

## 2024-04-22 NOTE — ED Notes (Signed)
 Facility finally called back and rec'd report.

## 2024-04-22 NOTE — ED Notes (Signed)
 On hold with facility

## 2024-04-22 NOTE — ED Notes (Signed)
 Got hung up on by facility. Attempted to call again but no one answered x2

## 2024-04-22 NOTE — ED Notes (Signed)
 Pt cleaned up with clots and bleeding from genitalia. Dr: made aware.
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
## 2024-04-22 NOTE — ED Notes (Signed)
 Pt is in [facility] on her way back to [facility], report has been called.

## 2024-04-22 NOTE — ED Notes (Signed)
 Looked at vaginal bleeding with EDP. Plan remains to have patient follow up with [facility]

## 2024-04-23 DIAGNOSIS — Z743 Need for continuous supervision: Secondary | ICD-10-CM | POA: Diagnosis not present

## 2024-04-23 MED ORDER — ONDANSETRON 4 MG PO TBDP
4.0000 mg | ORAL_TABLET | Freq: Once | ORAL | Status: AC
  Administered 2024-04-23: 4 mg via ORAL
  Filled 2024-04-23: qty 1

## 2024-04-23 NOTE — ED Notes (Signed)
 Left with ems to go back to [facility]

## 2024-04-25 DIAGNOSIS — N184 Chronic kidney disease, stage 4 (severe): Secondary | ICD-10-CM | POA: Diagnosis not present

## 2024-04-25 DIAGNOSIS — M069 Rheumatoid arthritis, unspecified: Secondary | ICD-10-CM | POA: Diagnosis not present

## 2024-04-25 DIAGNOSIS — I129 Hypertensive chronic kidney disease with stage 1 through stage 4 chronic kidney disease, or unspecified chronic kidney disease: Secondary | ICD-10-CM | POA: Diagnosis not present

## 2024-04-25 DIAGNOSIS — D631 Anemia in chronic kidney disease: Secondary | ICD-10-CM | POA: Diagnosis not present

## 2024-04-26 ENCOUNTER — Inpatient Hospital Stay: Attending: Hematology and Oncology

## 2024-04-26 ENCOUNTER — Other Ambulatory Visit: Payer: Self-pay | Admitting: Physician Assistant

## 2024-04-26 DIAGNOSIS — N184 Chronic kidney disease, stage 4 (severe): Secondary | ICD-10-CM | POA: Diagnosis not present

## 2024-04-26 DIAGNOSIS — N139 Obstructive and reflux uropathy, unspecified: Secondary | ICD-10-CM | POA: Diagnosis not present

## 2024-04-26 DIAGNOSIS — I129 Hypertensive chronic kidney disease with stage 1 through stage 4 chronic kidney disease, or unspecified chronic kidney disease: Secondary | ICD-10-CM | POA: Diagnosis not present

## 2024-04-26 DIAGNOSIS — M069 Rheumatoid arthritis, unspecified: Secondary | ICD-10-CM | POA: Diagnosis not present

## 2024-04-26 DIAGNOSIS — D649 Anemia, unspecified: Secondary | ICD-10-CM

## 2024-04-27 ENCOUNTER — Emergency Department (HOSPITAL_COMMUNITY)
Admission: EM | Admit: 2024-04-27 | Discharge: 2024-04-27 | Disposition: A | Discharge status: Home / Self Care | Source: Skilled Nursing Facility | Attending: Emergency Medicine | Admitting: Emergency Medicine

## 2024-04-27 ENCOUNTER — Other Ambulatory Visit: Payer: Self-pay

## 2024-04-27 ENCOUNTER — Encounter (HOSPITAL_COMMUNITY): Payer: Self-pay

## 2024-04-27 DIAGNOSIS — N139 Obstructive and reflux uropathy, unspecified: Secondary | ICD-10-CM | POA: Diagnosis not present

## 2024-04-27 DIAGNOSIS — I5042 Chronic combined systolic (congestive) and diastolic (congestive) heart failure: Secondary | ICD-10-CM | POA: Diagnosis not present

## 2024-04-27 DIAGNOSIS — R1084 Generalized abdominal pain: Secondary | ICD-10-CM | POA: Diagnosis not present

## 2024-04-27 DIAGNOSIS — R531 Weakness: Secondary | ICD-10-CM | POA: Diagnosis not present

## 2024-04-27 DIAGNOSIS — M069 Rheumatoid arthritis, unspecified: Secondary | ICD-10-CM | POA: Diagnosis not present

## 2024-04-27 DIAGNOSIS — I129 Hypertensive chronic kidney disease with stage 1 through stage 4 chronic kidney disease, or unspecified chronic kidney disease: Secondary | ICD-10-CM | POA: Diagnosis not present

## 2024-04-27 DIAGNOSIS — Z630 Problems in relationship with spouse or partner: Secondary | ICD-10-CM | POA: Diagnosis not present

## 2024-04-27 DIAGNOSIS — Z79899 Other long term (current) drug therapy: Secondary | ICD-10-CM | POA: Insufficient documentation

## 2024-04-27 DIAGNOSIS — R58 Hemorrhage, not elsewhere classified: Secondary | ICD-10-CM | POA: Diagnosis not present

## 2024-04-27 DIAGNOSIS — N184 Chronic kidney disease, stage 4 (severe): Secondary | ICD-10-CM | POA: Diagnosis not present

## 2024-04-27 DIAGNOSIS — I10 Essential (primary) hypertension: Secondary | ICD-10-CM | POA: Diagnosis not present

## 2024-04-27 DIAGNOSIS — R319 Hematuria, unspecified: Secondary | ICD-10-CM | POA: Insufficient documentation

## 2024-04-27 DIAGNOSIS — E871 Hypo-osmolality and hyponatremia: Secondary | ICD-10-CM | POA: Diagnosis not present

## 2024-04-27 DIAGNOSIS — D649 Anemia, unspecified: Secondary | ICD-10-CM | POA: Diagnosis not present

## 2024-04-27 DIAGNOSIS — K589 Irritable bowel syndrome without diarrhea: Secondary | ICD-10-CM | POA: Diagnosis not present

## 2024-04-27 LAB — CBC
HCT: 31 % — ABNORMAL LOW (ref 36.0–46.0)
Hemoglobin: 9.6 g/dL — ABNORMAL LOW (ref 12.0–15.0)
MCH: 30.3 pg (ref 26.0–34.0)
MCHC: 31 g/dL (ref 30.0–36.0)
MCV: 97.8 fL (ref 80.0–100.0)
Platelets: 651 K/uL — ABNORMAL HIGH (ref 150–400)
RBC: 3.17 MIL/uL — ABNORMAL LOW (ref 3.87–5.11)
RDW: 16.5 % — ABNORMAL HIGH (ref 11.5–15.5)
WBC: 12.2 K/uL — ABNORMAL HIGH (ref 4.0–10.5)
nRBC: 0 % (ref 0.0–0.2)

## 2024-04-27 LAB — BASIC METABOLIC PANEL WITH GFR
Anion gap: 22 — ABNORMAL HIGH (ref 5–15)
BUN: 30 mg/dL — ABNORMAL HIGH (ref 8–23)
CO2: 15 mmol/L — ABNORMAL LOW (ref 22–32)
Calcium: 8.9 mg/dL (ref 8.9–10.3)
Chloride: 100 mmol/L (ref 98–111)
Creatinine, Ser: 1.95 mg/dL — ABNORMAL HIGH (ref 0.44–1.00)
GFR, Estimated: 26 mL/min — ABNORMAL LOW (ref >60)
Glucose, Bld: 95 mg/dL (ref 70–99)
Potassium: 4.3 mmol/L (ref 3.5–5.1)
Sodium: 136 mmol/L (ref 135–145)

## 2024-04-27 MED ORDER — SODIUM CHLORIDE 0.9 % IV BOLUS
1000.0000 mL | Freq: Once | INTRAVENOUS | Status: AC
  Administered 2024-04-27: 1000 mL via INTRAVENOUS

## 2024-04-27 MED ORDER — ALPRAZOLAM 0.5 MG PO TABS
0.5000 mg | ORAL_TABLET | Freq: Once | ORAL | Status: AC
  Administered 2024-04-27: 0.5 mg via ORAL
  Filled 2024-04-27: qty 1

## 2024-04-27 MED ORDER — ONDANSETRON 4 MG PO TBDP
4.0000 mg | ORAL_TABLET | Freq: Once | ORAL | Status: AC
  Administered 2024-04-27: 4 mg via ORAL
  Filled 2024-04-27: qty 1

## 2024-04-27 NOTE — ED Triage Notes (Signed)
 Pt BIB [facility] from [facility] with c/o that her left nephrostomy tube was dislodged and fixed a few days ago but it is still showing blood. Not diabetic, does have HTN

## 2024-04-27 NOTE — ED Provider Notes (Signed)
 [place] EMERGENCY DEPARTMENT AT [facility] Provider Note   CSN: [id number] Arrival date & time: 04/27/24  1116     Patient presents with: No chief complaint on file.   Robin Blackburn is a 78 y.o. female presenting from [facility] with concern for persistent blood in her nephrostomy tube.  The patient is on Brilinta .  She has a history of macrocytic anemia.  Patient was admitted to the hospital at the end of October, approximately 3 weeks ago, with worsening kidney function, CT showing stable bilateral hydroureteronephrosis and concern for urinary tract infection.  She was given antibiotics in the hospital and needed 1 unit transfusion at the time.  There was concern for obstructive uropathy and so she had bilateral percutaneous nephrostomy tubes placed on 10/24 per my review of external records, discharge summary from 11/1.  She was seen again in our ER approximately 5 days ago, with concern for dislodged nephrostomy tube, and a head injury at that time.  IR was consulted due to dislodged left percutaneous nephrostomy tube and had replacement performed by IR on 11/14 per ED records.    Patient has been reporting some vaginal bleeding on and off for a few days or weeks.  Had vaginal exam on prior ED visit with no emergent findings.  Reports hx of hysterectomy, confirmed on CT abdomen 03/28/24   HPI     Prior to Admission medications   Medication Sig Start Date End Date Taking? Authorizing Provider  acetaminophen  (TYLENOL ) 500 MG tablet Take 1,000 mg by mouth 2 (two) times daily.   Yes [provider]  ALPRAZolam  (XANAX ) 0.5 MG tablet Take 1 tablet (0.5 mg total) by mouth 2 (two) times daily as needed for anxiety. 03/15/24  Yes Amin, Ankit C, MD  amLODipine  (NORVASC ) 2.5 MG tablet Take 2.5 mg by mouth daily. Hold for SBP<110 12/10/23  Yes [provider]  Cholecalciferol  5000 units TABS Take 1 tablet by mouth daily.    Yes [provider]   Cranberry 450 MG TABS Take 1 tablet by mouth in the morning and at bedtime. For UTI   Yes [provider]  cycloSPORINE  (RESTASIS ) 0.05 % ophthalmic emulsion Place 1 drop into both eyes 2 (two) times daily. Dry eyes   Yes [provider]  estradiol  (ESTRACE ) 0.1 MG/GM vaginal cream Place 1 Applicatorful vaginally every other day. Discard plastic applicator. Insert a blueberry size amount (approximately 1 gram) of cream on fingertip inside vagina at bedtime every other night for long term use. 01/09/24  Yes [name], [name], MD  ferrous sulfate  325 (65 FE) MG EC tablet Take 1 tablet (325 mg total) by mouth daily with breakfast. 02/04/24  Yes Thayil, Irene T, PA-C  fluticasone (FLONASE) 50 MCG/ACT nasal spray Place 1 spray into both nostrils 2 (two) times daily.   Yes [provider]  folic acid  (FOLVITE ) 1 MG tablet Take 1 mg by mouth daily.   Yes [provider]  gabapentin  (NEURONTIN ) 300 MG capsule Take 1 capsule (300 mg total) by mouth 3 (three) times daily as needed (neuropathy pain symptoms). 01/09/24  Yes [name], [name], MD  lactobacillus acidophilus (BACID) TABS tablet Take 1 tablet by mouth daily.   Yes [provider]  lansoprazole (PREVACID) 30 MG capsule Take 60 mg by mouth 2 (two) times daily before a meal. DO NOT CHANGE PER MD   Yes [provider]  leflunomide  (ARAVA ) 10 MG tablet Take 10 mg by mouth daily. 08/01/20  Yes [provider]  Melatonin 10 MG TABS Take 10 mg by mouth at bedtime.   Yes [provider]  Menthol-Methyl Salicylate (MUSCLE RUB) 10-15 % CREA Apply 1 Application topically every 6 (six) hours as needed (Neck pain).   Yes [provider]  methenamine  (HIPREX ) 1 g tablet Take 1 tablet (1 g total) by mouth 2 (two) times daily. Prophylactic 01/22/24  Yes [name], [name], MD  metoprolol  tartrate (LOPRESSOR ) 25 MG tablet Take 0.5 tablets (12.5 mg total) by mouth 2 (two) times daily. 01/09/24   Yes [name], [name], MD  Multiple Vitamin (MULTIVITAMIN PO) Take 1 tablet by mouth 2 (two) times daily.   Yes [provider]  Nutritional Supplement LIQD Take 120 mLs by mouth daily at 6 PM. House supplement on [facility]   Yes [provider]  ondansetron  (ZOFRAN ) 4 MG tablet Take 4 mg by mouth every 8 (eight) hours as needed for vomiting or nausea. 02/13/24  Yes [provider]  ondansetron  (ZOFRAN ) 8 MG tablet Take 8 mg by mouth 3 (three) times daily. 04/26/24  Yes [provider]  senna (SENOKOT) 8.6 MG tablet Take 1 tablet by mouth 2 (two) times daily.   Yes [provider]  sodium bicarbonate 650 MG tablet Take 1 tablet (650 mg total) by mouth 3 (three) times daily. 04/09/24 05/09/24 Yes Shahmehdi, Adriana LABOR, MD  ticagrelor  (BRILINTA ) 60 MG TABS tablet Take 60 mg by mouth daily.   Yes [provider]  traMADol  (ULTRAM ) 50 MG tablet Take 1 tablet (50 mg total) by mouth every 8 (eight) hours as needed for severe pain (pain score 7-10). Patient taking differently: Take 50 mg by mouth every 6 (six) hours as needed for severe pain (pain score 7-10). 03/15/24  Yes Amin, Ankit C, MD  traZODone  (DESYREL ) 100 MG tablet Take 100 mg by mouth at bedtime. 02/25/23  Yes [provider]  vitamin B-12 (CYANOCOBALAMIN ) 250 MCG tablet Take 250 mcg by mouth daily.   Yes [provider]  Adalimumab (HUMIRA, 2 PEN,) 40 MG/0.8ML PNKT Inject 40 mg into the skin every 14 (fourteen) days. Patient not taking: Reported on 04/27/2024    [provider]  Heparin  Sod, Pork, Lock Flush (HEPARIN  FLUSH) 10 UNIT/ML SOLN injection Inject 5 Units into the vein daily. Patient not taking: Reported on 04/27/2024 04/10/24   [provider]    Allergies: Cortisone, Fentanyl , Hydromorphone , Iodinated contrast media, Keflex  [cephalexin ], Meperidine, Morphine, Oxycodone -acetaminophen , Penicillins, Afluria preservative free [influenza virus vacc split pf],  Aspirin , Codeine, Influenza virus vaccine, Meperidine hcl, Oxycodone  hcl, Shellfish allergy, Sulfa  antibiotics, Troleandomycin, Potassium chloride , Bisphosphonates, Ciprofloxacin , Levaquin [levofloxacin in d5w], Oxytetracycline, and Wheat    Review of Systems  Updated Vital Signs BP 130/72   Pulse 96   Temp 97.9 F (36.6 C) (Oral)   Resp 18   Ht 5' 6 (1.676 m)   Wt 77 kg   SpO2 97%   BMI 27.40 kg/m   Physical Exam Constitutional:      General: She is not in acute distress. HENT:     Head: Normocephalic and atraumatic.  Eyes:     Conjunctiva/sclera: Conjunctivae normal.     Pupils: Pupils are equal, round, and reactive to light.  Cardiovascular:     Rate and Rhythm: Normal rate and regular rhythm.  Pulmonary:     Effort: Pulmonary effort is normal. No respiratory distress.  Abdominal:     General: There is no distension.     Tenderness:  There is no abdominal tenderness.     Comments: Bloody drainage in left nephrostomy bag; right nephrostomy bag with clear urine drainage  Skin:    General: Skin is warm and dry.  Neurological:     General: No focal deficit present.     Mental Status: She is alert. Mental status is at baseline.  Psychiatric:        Mood and Affect: Mood normal.        Behavior: Behavior normal.     (all labs ordered are listed, but only abnormal results are displayed) Labs Reviewed  BASIC METABOLIC PANEL WITH GFR - Abnormal; Notable for the following components:      Result Value   CO2 15 (*)    BUN 30 (*)    Creatinine, Ser 1.95 (*)    GFR, Estimated 26 (*)    Anion gap 22 (*)    All other components within normal limits  CBC - Abnormal; Notable for the following components:   WBC 12.2 (*)    RBC 3.17 (*)    Hemoglobin 9.6 (*)    HCT 31.0 (*)    RDW 16.5 (*)    Platelets 651 (*)    All other components within normal limits  URINE CULTURE    EKG: None  Radiology: No results found.   Procedures   Medications Ordered in the ED   sodium chloride  0.9 % bolus 1,000 mL (0 mLs Intravenous Stopped 04/27/24 1623)  ALPRAZolam  (XANAX ) tablet 0.5 mg (0.5 mg Oral Given 04/27/24 1624)  ondansetron  (ZOFRAN -ODT) disintegrating tablet 4 mg (4 mg Oral Given 04/27/24 1624)    Clinical Course as of 04/27/24 1740  Wed Apr 27, 2024  1249 Hgb stable over past 2 weeks [MT]    Clinical Course User Index [MT] Rowen Hur, Donnice PARAS, MD                                 Medical Decision Making Amount and/or Complexity of Data Reviewed Labs: ordered.  Risk Prescription drug management.   She is here for concern of bleeding into left nephrostomy bag after replacement for dislodged left nephrostomy tube, performed 4 days ago.  Hemodynamically she is stable.  Will recheck her creatinine level and hgb level here.   Labs reviewed - some mild increase of BUN and Cr, hgb stable near baseline levels.  Will give IV fluids for hydration (suspect dehydration as a cause).  No indication for blood transfusion.  Hematuria output may be chronic from nephrostomy placement, but I am not concerned about significant blood loss anemia.  We'll send a urine culture off (see ed course), but she has follow up with a nephrologist in the next 2 days.  She can have labs rechecked then or with her facility.  I don't see convincing evidence of UTI at this point to initiate empiric treatment of antibiotics.  I do suspect her reported vaginal bleeding is likely related to hematuria as she has incontinence.  S/p hysterectomy, not a uterine issue.     Final diagnoses:  Hematuria, unspecified type    ED Discharge Orders     None          Cottie Donnice PARAS, MD 04/27/24 [phone]

## 2024-04-27 NOTE — Discharge Instructions (Addendum)
 Please follow up with your kidney doctor to have your kidney function and hemoglobin rechecked.  It is common to have ongoing bleeding after a kidney tube placement.  This can last for days or even weeks.  It is not a dangerous problem unless your blood count drops.  Your hemoglobin was 9.6 today which has been stable over the past 2 weeks.  You were given some IV fluids for a slight increase in your creatinine (kidney function), which may be due to some dehydration.  The kidney doctor can recheck this.  You mentioned burning with urination.  We sent a urine culture which will take 2-3 days to result.  This will determine if there is a urinary infection.  At this time, you other tests do not indicate an obvious urine infection.

## 2024-04-28 DIAGNOSIS — I129 Hypertensive chronic kidney disease with stage 1 through stage 4 chronic kidney disease, or unspecified chronic kidney disease: Secondary | ICD-10-CM | POA: Diagnosis not present

## 2024-04-28 DIAGNOSIS — N1832 Chronic kidney disease, stage 3b: Secondary | ICD-10-CM | POA: Diagnosis not present

## 2024-04-28 DIAGNOSIS — D638 Anemia in other chronic diseases classified elsewhere: Secondary | ICD-10-CM | POA: Diagnosis not present

## 2024-04-28 DIAGNOSIS — E872 Acidosis, unspecified: Secondary | ICD-10-CM | POA: Diagnosis not present

## 2024-04-28 DIAGNOSIS — N139 Obstructive and reflux uropathy, unspecified: Secondary | ICD-10-CM | POA: Diagnosis not present

## 2024-04-28 LAB — URINE CULTURE

## 2024-05-03 ENCOUNTER — Inpatient Hospital Stay: Admitting: Physician Assistant

## 2024-05-03 DIAGNOSIS — I129 Hypertensive chronic kidney disease with stage 1 through stage 4 chronic kidney disease, or unspecified chronic kidney disease: Secondary | ICD-10-CM | POA: Diagnosis not present

## 2024-05-03 DIAGNOSIS — M6281 Muscle weakness (generalized): Secondary | ICD-10-CM | POA: Diagnosis not present

## 2024-05-03 DIAGNOSIS — K589 Irritable bowel syndrome without diarrhea: Secondary | ICD-10-CM | POA: Diagnosis not present

## 2024-05-03 DIAGNOSIS — F419 Anxiety disorder, unspecified: Secondary | ICD-10-CM | POA: Diagnosis not present

## 2024-05-03 DIAGNOSIS — M069 Rheumatoid arthritis, unspecified: Secondary | ICD-10-CM | POA: Diagnosis not present

## 2024-05-03 DIAGNOSIS — N179 Acute kidney failure, unspecified: Secondary | ICD-10-CM | POA: Diagnosis not present

## 2024-05-03 DIAGNOSIS — F13232 Sedative, hypnotic or anxiolytic dependence with withdrawal with perceptual disturbance: Secondary | ICD-10-CM | POA: Diagnosis not present

## 2024-05-03 DIAGNOSIS — I5042 Chronic combined systolic (congestive) and diastolic (congestive) heart failure: Secondary | ICD-10-CM | POA: Diagnosis not present

## 2024-05-03 DIAGNOSIS — E871 Hypo-osmolality and hyponatremia: Secondary | ICD-10-CM | POA: Diagnosis not present

## 2024-05-03 DIAGNOSIS — D638 Anemia in other chronic diseases classified elsewhere: Secondary | ICD-10-CM | POA: Diagnosis not present

## 2024-05-03 DIAGNOSIS — N184 Chronic kidney disease, stage 4 (severe): Secondary | ICD-10-CM | POA: Diagnosis not present

## 2024-05-03 DIAGNOSIS — N1832 Chronic kidney disease, stage 3b: Secondary | ICD-10-CM | POA: Diagnosis not present

## 2024-05-04 DIAGNOSIS — R8290 Unspecified abnormal findings in urine: Secondary | ICD-10-CM | POA: Diagnosis not present

## 2024-05-04 DIAGNOSIS — K589 Irritable bowel syndrome without diarrhea: Secondary | ICD-10-CM | POA: Diagnosis not present

## 2024-05-04 DIAGNOSIS — I10 Essential (primary) hypertension: Secondary | ICD-10-CM | POA: Diagnosis not present

## 2024-05-04 DIAGNOSIS — I5042 Chronic combined systolic (congestive) and diastolic (congestive) heart failure: Secondary | ICD-10-CM | POA: Diagnosis not present

## 2024-05-04 DIAGNOSIS — E871 Hypo-osmolality and hyponatremia: Secondary | ICD-10-CM | POA: Diagnosis not present

## 2024-05-04 DIAGNOSIS — M175 Other unilateral secondary osteoarthritis of knee: Secondary | ICD-10-CM | POA: Diagnosis not present

## 2024-05-06 DIAGNOSIS — N184 Chronic kidney disease, stage 4 (severe): Secondary | ICD-10-CM | POA: Diagnosis not present

## 2024-05-06 DIAGNOSIS — M069 Rheumatoid arthritis, unspecified: Secondary | ICD-10-CM | POA: Diagnosis not present

## 2024-05-06 DIAGNOSIS — I129 Hypertensive chronic kidney disease with stage 1 through stage 4 chronic kidney disease, or unspecified chronic kidney disease: Secondary | ICD-10-CM | POA: Diagnosis not present

## 2024-05-06 DIAGNOSIS — N179 Acute kidney failure, unspecified: Secondary | ICD-10-CM | POA: Diagnosis not present

## 2024-05-09 ENCOUNTER — Ambulatory Visit: Admitting: Internal Medicine

## 2024-05-09 DIAGNOSIS — I5042 Chronic combined systolic (congestive) and diastolic (congestive) heart failure: Secondary | ICD-10-CM | POA: Diagnosis not present

## 2024-05-09 DIAGNOSIS — K589 Irritable bowel syndrome without diarrhea: Secondary | ICD-10-CM | POA: Diagnosis not present

## 2024-05-09 DIAGNOSIS — I10 Essential (primary) hypertension: Secondary | ICD-10-CM | POA: Diagnosis not present

## 2024-05-10 DIAGNOSIS — I129 Hypertensive chronic kidney disease with stage 1 through stage 4 chronic kidney disease, or unspecified chronic kidney disease: Secondary | ICD-10-CM | POA: Diagnosis not present

## 2024-05-10 DIAGNOSIS — M069 Rheumatoid arthritis, unspecified: Secondary | ICD-10-CM | POA: Diagnosis not present

## 2024-05-10 DIAGNOSIS — D631 Anemia in chronic kidney disease: Secondary | ICD-10-CM | POA: Diagnosis not present

## 2024-05-10 DIAGNOSIS — N184 Chronic kidney disease, stage 4 (severe): Secondary | ICD-10-CM | POA: Diagnosis not present

## 2024-05-12 DIAGNOSIS — M069 Rheumatoid arthritis, unspecified: Secondary | ICD-10-CM | POA: Diagnosis not present

## 2024-05-12 DIAGNOSIS — N184 Chronic kidney disease, stage 4 (severe): Secondary | ICD-10-CM | POA: Diagnosis not present

## 2024-05-12 DIAGNOSIS — M6281 Muscle weakness (generalized): Secondary | ICD-10-CM | POA: Diagnosis not present

## 2024-05-12 DIAGNOSIS — D631 Anemia in chronic kidney disease: Secondary | ICD-10-CM | POA: Diagnosis not present

## 2024-05-16 ENCOUNTER — Encounter: Admitting: Obstetrics & Gynecology

## 2024-05-17 DIAGNOSIS — D631 Anemia in chronic kidney disease: Secondary | ICD-10-CM | POA: Diagnosis not present

## 2024-05-17 DIAGNOSIS — M069 Rheumatoid arthritis, unspecified: Secondary | ICD-10-CM | POA: Diagnosis not present

## 2024-05-17 DIAGNOSIS — I10 Essential (primary) hypertension: Secondary | ICD-10-CM | POA: Diagnosis not present

## 2024-05-17 DIAGNOSIS — N184 Chronic kidney disease, stage 4 (severe): Secondary | ICD-10-CM | POA: Diagnosis not present

## 2024-05-17 DIAGNOSIS — I5042 Chronic combined systolic (congestive) and diastolic (congestive) heart failure: Secondary | ICD-10-CM | POA: Diagnosis not present

## 2024-05-17 DIAGNOSIS — J690 Pneumonitis due to inhalation of food and vomit: Secondary | ICD-10-CM | POA: Diagnosis not present

## 2024-05-17 DIAGNOSIS — K589 Irritable bowel syndrome without diarrhea: Secondary | ICD-10-CM | POA: Diagnosis not present

## 2024-06-06 ENCOUNTER — Encounter: Payer: Self-pay | Admitting: *Deleted

## 2024-06-07 ENCOUNTER — Encounter (HOSPITAL_COMMUNITY): Payer: Self-pay

## 2024-06-07 ENCOUNTER — Inpatient Hospital Stay (HOSPITAL_COMMUNITY)
Admission: EM | Admit: 2024-06-07 | Discharge: 2024-06-14 | DRG: 699 | Disposition: A | Discharge status: Skilled Nursing Facility | Source: Skilled Nursing Facility | Attending: Family Medicine | Admitting: Family Medicine

## 2024-06-07 ENCOUNTER — Emergency Department (HOSPITAL_COMMUNITY)

## 2024-06-07 ENCOUNTER — Other Ambulatory Visit: Payer: Self-pay

## 2024-06-07 DIAGNOSIS — Z886 Allergy status to analgesic agent status: Secondary | ICD-10-CM

## 2024-06-07 DIAGNOSIS — I10 Essential (primary) hypertension: Secondary | ICD-10-CM

## 2024-06-07 DIAGNOSIS — D638 Anemia in other chronic diseases classified elsewhere: Secondary | ICD-10-CM | POA: Insufficient documentation

## 2024-06-07 DIAGNOSIS — M797 Fibromyalgia: Secondary | ICD-10-CM | POA: Diagnosis present

## 2024-06-07 DIAGNOSIS — Z880 Allergy status to penicillin: Secondary | ICD-10-CM

## 2024-06-07 DIAGNOSIS — Z91013 Allergy to seafood: Secondary | ICD-10-CM

## 2024-06-07 DIAGNOSIS — Z79899 Other long term (current) drug therapy: Secondary | ICD-10-CM

## 2024-06-07 DIAGNOSIS — E440 Moderate protein-calorie malnutrition: Secondary | ICD-10-CM | POA: Diagnosis present

## 2024-06-07 DIAGNOSIS — N320 Bladder-neck obstruction: Secondary | ICD-10-CM | POA: Diagnosis present

## 2024-06-07 DIAGNOSIS — R627 Adult failure to thrive: Principal | ICD-10-CM | POA: Diagnosis present

## 2024-06-07 DIAGNOSIS — D539 Nutritional anemia, unspecified: Secondary | ICD-10-CM | POA: Diagnosis present

## 2024-06-07 DIAGNOSIS — Z7902 Long term (current) use of antithrombotics/antiplatelets: Secondary | ICD-10-CM

## 2024-06-07 DIAGNOSIS — Z66 Do not resuscitate: Secondary | ICD-10-CM | POA: Diagnosis present

## 2024-06-07 DIAGNOSIS — I129 Hypertensive chronic kidney disease with stage 1 through stage 4 chronic kidney disease, or unspecified chronic kidney disease: Secondary | ICD-10-CM | POA: Diagnosis present

## 2024-06-07 DIAGNOSIS — E8809 Other disorders of plasma-protein metabolism, not elsewhere classified: Secondary | ICD-10-CM | POA: Diagnosis present

## 2024-06-07 DIAGNOSIS — Z801 Family history of malignant neoplasm of trachea, bronchus and lung: Secondary | ICD-10-CM

## 2024-06-07 DIAGNOSIS — E860 Dehydration: Secondary | ICD-10-CM | POA: Diagnosis present

## 2024-06-07 DIAGNOSIS — Z806 Family history of leukemia: Secondary | ICD-10-CM

## 2024-06-07 DIAGNOSIS — N139 Obstructive and reflux uropathy, unspecified: Secondary | ICD-10-CM | POA: Diagnosis present

## 2024-06-07 DIAGNOSIS — I2510 Atherosclerotic heart disease of native coronary artery without angina pectoris: Secondary | ICD-10-CM | POA: Diagnosis present

## 2024-06-07 DIAGNOSIS — D631 Anemia in chronic kidney disease: Secondary | ICD-10-CM | POA: Diagnosis present

## 2024-06-07 DIAGNOSIS — Z8249 Family history of ischemic heart disease and other diseases of the circulatory system: Secondary | ICD-10-CM

## 2024-06-07 DIAGNOSIS — B961 Klebsiella pneumoniae [K. pneumoniae] as the cause of diseases classified elsewhere: Secondary | ICD-10-CM | POA: Diagnosis present

## 2024-06-07 DIAGNOSIS — Z888 Allergy status to other drugs, medicaments and biological substances status: Secondary | ICD-10-CM

## 2024-06-07 DIAGNOSIS — L8990 Pressure ulcer of unspecified site, unspecified stage: Secondary | ICD-10-CM | POA: Insufficient documentation

## 2024-06-07 DIAGNOSIS — K219 Gastro-esophageal reflux disease without esophagitis: Secondary | ICD-10-CM | POA: Diagnosis present

## 2024-06-07 DIAGNOSIS — N99522 Malfunction of other external stoma of urinary tract: Secondary | ICD-10-CM | POA: Diagnosis present

## 2024-06-07 DIAGNOSIS — Z803 Family history of malignant neoplasm of breast: Secondary | ICD-10-CM

## 2024-06-07 DIAGNOSIS — N179 Acute kidney failure, unspecified: Secondary | ICD-10-CM | POA: Diagnosis present

## 2024-06-07 DIAGNOSIS — N136 Pyonephrosis: Secondary | ICD-10-CM | POA: Diagnosis present

## 2024-06-07 DIAGNOSIS — Z91041 Radiographic dye allergy status: Secondary | ICD-10-CM

## 2024-06-07 DIAGNOSIS — F419 Anxiety disorder, unspecified: Secondary | ICD-10-CM | POA: Diagnosis present

## 2024-06-07 DIAGNOSIS — Z885 Allergy status to narcotic agent status: Secondary | ICD-10-CM

## 2024-06-07 DIAGNOSIS — Z6824 Body mass index (BMI) 24.0-24.9, adult: Secondary | ICD-10-CM

## 2024-06-07 DIAGNOSIS — N390 Urinary tract infection, site not specified: Secondary | ICD-10-CM | POA: Diagnosis present

## 2024-06-07 DIAGNOSIS — G629 Polyneuropathy, unspecified: Secondary | ICD-10-CM | POA: Diagnosis present

## 2024-06-07 DIAGNOSIS — Z87440 Personal history of urinary (tract) infections: Secondary | ICD-10-CM

## 2024-06-07 DIAGNOSIS — Z79620 Long term (current) use of immunosuppressive biologic: Secondary | ICD-10-CM

## 2024-06-07 DIAGNOSIS — Z887 Allergy status to serum and vaccine status: Secondary | ICD-10-CM

## 2024-06-07 DIAGNOSIS — Z881 Allergy status to other antibiotic agents status: Secondary | ICD-10-CM

## 2024-06-07 DIAGNOSIS — Z882 Allergy status to sulfonamides status: Secondary | ICD-10-CM

## 2024-06-07 DIAGNOSIS — D649 Anemia, unspecified: Secondary | ICD-10-CM

## 2024-06-07 DIAGNOSIS — Z8673 Personal history of transient ischemic attack (TIA), and cerebral infarction without residual deficits: Secondary | ICD-10-CM

## 2024-06-07 DIAGNOSIS — T83512A Infection and inflammatory reaction due to nephrostomy catheter, initial encounter: Principal | ICD-10-CM | POA: Diagnosis present

## 2024-06-07 DIAGNOSIS — Z79818 Long term (current) use of other agents affecting estrogen receptors and estrogen levels: Secondary | ICD-10-CM

## 2024-06-07 DIAGNOSIS — N184 Chronic kidney disease, stage 4 (severe): Secondary | ICD-10-CM | POA: Diagnosis present

## 2024-06-07 DIAGNOSIS — Y833 Surgical operation with formation of external stoma as the cause of abnormal reaction of the patient, or of later complication, without mention of misadventure at the time of the procedure: Secondary | ICD-10-CM | POA: Diagnosis present

## 2024-06-07 DIAGNOSIS — Z823 Family history of stroke: Secondary | ICD-10-CM

## 2024-06-07 DIAGNOSIS — M069 Rheumatoid arthritis, unspecified: Secondary | ICD-10-CM | POA: Diagnosis present

## 2024-06-07 LAB — COMPREHENSIVE METABOLIC PANEL WITH GFR
ALT: <5 U/L (ref 0–44)
AST: 17 U/L (ref 15–41)
Albumin: 2.3 g/dL — ABNORMAL LOW (ref 3.5–5.0)
Alkaline Phosphatase: 180 U/L — ABNORMAL HIGH (ref 38–126)
Anion gap: 13 (ref 5–15)
BUN: 16 mg/dL (ref 8–23)
CO2: 19 mmol/L — ABNORMAL LOW (ref 22–32)
Calcium: 7.7 mg/dL — ABNORMAL LOW (ref 8.9–10.3)
Chloride: 105 mmol/L (ref 98–111)
Creatinine, Ser: 2.03 mg/dL — ABNORMAL HIGH (ref 0.44–1.00)
GFR, Estimated: 25 mL/min — ABNORMAL LOW
Glucose, Bld: 121 mg/dL — ABNORMAL HIGH (ref 70–99)
Potassium: 4.7 mmol/L (ref 3.5–5.1)
Sodium: 137 mmol/L (ref 135–145)
Total Bilirubin: 0.2 mg/dL (ref 0.0–1.2)
Total Protein: 5.2 g/dL — ABNORMAL LOW (ref 6.5–8.1)

## 2024-06-07 LAB — CBC WITH DIFFERENTIAL/PLATELET
Abs Immature Granulocytes: 0.16 K/uL — ABNORMAL HIGH (ref 0.00–0.07)
Basophils Absolute: 0.1 K/uL (ref 0.0–0.1)
Basophils Relative: 0 %
Eosinophils Absolute: 0.5 K/uL (ref 0.0–0.5)
Eosinophils Relative: 3 %
HCT: 24.9 % — ABNORMAL LOW (ref 36.0–46.0)
Hemoglobin: 7.8 g/dL — ABNORMAL LOW (ref 12.0–15.0)
Immature Granulocytes: 1 %
Lymphocytes Relative: 6 %
Lymphs Abs: 1.2 K/uL (ref 0.7–4.0)
MCH: 32 pg (ref 26.0–34.0)
MCHC: 31.3 g/dL (ref 30.0–36.0)
MCV: 102 fL — ABNORMAL HIGH (ref 80.0–100.0)
Monocytes Absolute: 0.8 K/uL (ref 0.1–1.0)
Monocytes Relative: 4 %
Neutro Abs: 15.8 K/uL — ABNORMAL HIGH (ref 1.7–7.7)
Neutrophils Relative %: 86 %
Platelets: 603 K/uL — ABNORMAL HIGH (ref 150–400)
RBC: 2.44 MIL/uL — ABNORMAL LOW (ref 3.87–5.11)
RDW: 15 % (ref 11.5–15.5)
WBC: 18.5 K/uL — ABNORMAL HIGH (ref 4.0–10.5)
nRBC: 0 % (ref 0.0–0.2)

## 2024-06-07 LAB — TYPE AND SCREEN
ABO/RH(D): O POS
Antibody Screen: NEGATIVE

## 2024-06-07 LAB — LACTIC ACID, PLASMA: Lactic Acid, Venous: 0.9 mmol/L (ref 0.5–1.9)

## 2024-06-07 LAB — CBG MONITORING, ED: Glucose-Capillary: 106 mg/dL — ABNORMAL HIGH (ref 70–99)

## 2024-06-07 MED ORDER — SODIUM CHLORIDE 0.9 % IV BOLUS
1000.0000 mL | Freq: Once | INTRAVENOUS | Status: AC
  Administered 2024-06-08: 1000 mL via INTRAVENOUS

## 2024-06-07 MED ORDER — SODIUM CHLORIDE 0.9 % IV BOLUS
1000.0000 mL | Freq: Once | INTRAVENOUS | Status: AC
  Administered 2024-06-07: 1000 mL via INTRAVENOUS

## 2024-06-07 NOTE — ED Triage Notes (Signed)
 Pt arrived via [employer] from [facility] for evaluation of failure to thrive. Per facility, the Pts POA and facility MD agree the Pt needs evaluation due to not eating, drinking or taking medications for past couple of days. Pt is currently under palliative care and does present with [place].

## 2024-06-07 NOTE — ED Notes (Signed)
 Pericare performed and clean brief placed on pt.

## 2024-06-07 NOTE — ED Provider Notes (Signed)
 " [place] EMERGENCY DEPARTMENT AT [facility] Provider Note   CSN: [id number] Arrival date & time: 06/07/24  1717     Patient presents with: Failure To Thrive   Robin Blackburn is a 78 y.o. female.  {Add pertinent medical, surgical, social history, OB history to HPI:32947} Pt is a 78 yo female with pmhx significant for anxiety, htn, RA, GERD, fibromyalgia, CVA, hx of obstructive uropathy (s/p bilateral nephrostomy tubes), and CKD.  Pt was sent here via EMS as she has not been eating/drinking much or taking her meds.  She has some throat pain.  She also complains of burning when she urinates as she's still able to urinate.          Prior to Admission medications  Medication Sig Start Date End Date Taking? Authorizing Provider  acetaminophen  (TYLENOL ) 500 MG tablet Take 1,000 mg by mouth 2 (two) times daily.    [provider]  Adalimumab (HUMIRA, 2 PEN,) 40 MG/0.8ML PNKT Inject 40 mg into the skin every 14 (fourteen) days. Patient not taking: Reported on 04/27/2024    [provider]  ALPRAZolam  (XANAX ) 0.5 MG tablet Take 1 tablet (0.5 mg total) by mouth 2 (two) times daily as needed for anxiety. 03/15/24   Amin, Ankit C, MD  amLODipine  (NORVASC ) 2.5 MG tablet Take 2.5 mg by mouth daily. Hold for SBP<110 12/10/23   [provider]  Cholecalciferol  5000 units TABS Take 1 tablet by mouth daily.     [provider]  Cranberry 450 MG TABS Take 1 tablet by mouth in the morning and at bedtime. For UTI    [provider]  cycloSPORINE  (RESTASIS ) 0.05 % ophthalmic emulsion Place 1 drop into both eyes 2 (two) times daily. Dry eyes    [provider]  estradiol  (ESTRACE ) 0.1 MG/GM vaginal cream Place 1 Applicatorful vaginally every other day. Discard plastic applicator. Insert a blueberry size amount (approximately 1 gram) of cream on fingertip inside vagina at bedtime every other night for long term use. 01/09/24   [name],  MD  ferrous sulfate  325 (65 FE) MG EC tablet Take 1 tablet (325 mg total) by mouth daily with breakfast. 02/04/24   [name], PA-C  fluticasone (FLONASE) 50 MCG/ACT nasal spray Place 1 spray into both nostrils 2 (two) times daily.    [provider]  folic acid  (FOLVITE ) 1 MG tablet Take 1 mg by mouth daily.    [provider]  gabapentin  (NEURONTIN ) 300 MG capsule Take 1 capsule (300 mg total) by mouth 3 (three) times daily as needed (neuropathy pain symptoms). 01/09/24   [name], MD  Heparin  Sod, Pork, Lock Flush (HEPARIN  FLUSH) 10 UNIT/ML SOLN injection Inject 5 Units into the vein daily. Patient not taking: Reported on 04/27/2024 04/10/24   [provider]  lactobacillus acidophilus (BACID) TABS tablet Take 1 tablet by mouth daily.    [provider]  lansoprazole (PREVACID) 30 MG capsule Take 60 mg by mouth 2 (two) times daily before a meal. DO NOT CHANGE PER MD    [provider]  leflunomide  (ARAVA ) 10 MG tablet Take 10 mg by mouth daily. 08/01/20   [provider]  Melatonin 10 MG TABS Take 10 mg by mouth at bedtime.    [provider]  Menthol-Methyl Salicylate (MUSCLE RUB) 10-15 % CREA Apply 1 Application topically every 6 (six) hours as needed (Neck pain).    [provider]  methenamine  (HIPREX ) 1  g tablet Take 1 tablet (1 g total) by mouth 2 (two) times daily. Prophylactic 01/22/24   [name], [name], MD  metoprolol  tartrate (LOPRESSOR ) 25 MG tablet Take 0.5 tablets (12.5 mg total) by mouth 2 (two) times daily. 01/09/24   [name], MD  Multiple Vitamin (MULTIVITAMIN PO) Take 1 tablet by mouth 2 (two) times daily.    [provider]  Nutritional Supplement LIQD Take 120 mLs by mouth daily at 6 PM. House supplement on [facility]    [provider]  ondansetron  (ZOFRAN ) 4 MG tablet Take 4 mg by mouth every 8 (eight) hours as needed for vomiting or nausea. 02/13/24   [provider]  ondansetron  (ZOFRAN ) 8 MG tablet Take 8 mg by mouth 3 (three) times daily. 04/26/24   [provider]  senna (SENOKOT) 8.6 MG tablet Take 1 tablet by mouth 2 (two) times daily.    [provider]  ticagrelor  (BRILINTA ) 60 MG TABS tablet Take 60 mg by mouth daily.    [provider]  traMADol  (ULTRAM ) 50 MG tablet Take 1 tablet (50 mg total) by mouth every 8 (eight) hours as needed for severe pain (pain score 7-10). Patient taking differently: Take 50 mg by mouth every 6 (six) hours as needed for severe pain (pain score 7-10). 03/15/24   Amin, Ankit C, MD  traZODone  (DESYREL ) 100 MG tablet Take 100 mg by mouth at bedtime. 02/25/23   [provider]  vitamin B-12 (CYANOCOBALAMIN ) 250 MCG tablet Take 250 mcg by mouth daily.    [provider]    Allergies: Cortisone, Fentanyl , Hydromorphone , Iodinated contrast media, Keflex  [cephalexin ], Meperidine, Morphine, Oxycodone -acetaminophen , Penicillins, Afluria preservative free [influenza virus vacc split pf], Aspirin , Codeine, Influenza virus vaccine, Meperidine hcl, Oxycodone  hcl, Shellfish allergy, Sulfa  antibiotics, Troleandomycin, Potassium chloride , Bisphosphonates, Ciprofloxacin , Levaquin [levofloxacin in d5w], Oxytetracycline, and Wheat    Review of Systems  Constitutional:  Positive for appetite change.  HENT:  Positive for sore throat.   Genitourinary:  Positive for dysuria.  All other systems reviewed and are negative.   Updated Vital Signs BP (!) 124/55   Pulse 90   Temp 99.2 F (37.3 C) (Oral)   Resp 20   Ht 5' 6 (1.676 m)   Wt 77 kg   SpO2 94%   BMI 27.40 kg/m   Physical Exam Vitals and nursing note reviewed.  Constitutional:      Appearance: Normal appearance.  HENT:     Head: Normocephalic and atraumatic.     Comments: Oral thrush    Right Ear: External ear normal.     Left Ear: External ear normal.     Nose: Nose normal.     Mouth/Throat:     Mouth: Mucous  membranes are dry.  Eyes:     Extraocular Movements: Extraocular movements intact.     Conjunctiva/sclera: Conjunctivae normal.     Pupils: Pupils are equal, round, and reactive to light.  Cardiovascular:     Rate and Rhythm: Normal rate and regular rhythm.     Pulses: Normal pulses.     Heart sounds: Normal heart sounds.  Pulmonary:     Effort: Pulmonary effort is normal.     Breath sounds: Normal breath sounds.  Genitourinary:    Comments: Bilateral nephrostomy tubes noted Musculoskeletal:        General: Normal range of motion.     Cervical back: Normal range of motion and neck supple.  Skin:    General: Skin is  warm.     Capillary Refill: Capillary refill takes less than 2 seconds.  Neurological:     General: No focal deficit present.     Mental Status: She is alert and oriented to person, place, and time.     (all labs ordered are listed, but only abnormal results are displayed) Labs Reviewed  CBC WITH DIFFERENTIAL/PLATELET  COMPREHENSIVE METABOLIC PANEL WITH GFR  URINALYSIS, ROUTINE W REFLEX MICROSCOPIC  LACTIC ACID, PLASMA  LACTIC ACID, PLASMA  CBG MONITORING, ED    EKG: None  Radiology: No results found.  {Document cardiac monitor, telemetry assessment procedure when appropriate:32947} Procedures   Medications Ordered in the ED  sodium chloride  0.9 % bolus 1,000 mL (has no administration in time range)      {Click here for ABCD2, HEART and other calculators REFRESH Note before signing:1}                              Medical Decision Making Amount and/or Complexity of Data Reviewed Labs: ordered. Radiology: ordered.   This patient presents to the ED for concern of ftt, this involves an extensive number of treatment options, and is a complaint that carries with it a high risk of complications and morbidity.  The differential diagnosis includes infection, electrolyte abn, dehydration   Co morbidities that complicate the patient evaluation  anxiety,  htn, RA, GERD, fibromyalgia, CVA, hx of obstructive uropathy (s/p bilateral nephrostomy tubes), and CKD   Additional history obtained:  Additional history obtained from epic chart review External records from outside source obtained and reviewed including EMS report   Lab Tests:  I Ordered, and personally interpreted labs.  The pertinent results include:  cbc with wbc elevated at 18.5, hgb dropped to 7.8 (hgb 9.6 on 11/19); cmp with cr 2.03 (1.95 on 11/19, but 1.41 on 11/1); calcium  low at 7.7; lactic nl   Imaging Studies ordered:  I ordered imaging studies including cxr  I independently visualized and interpreted imaging which showed *** I agree with the radiologist interpretation   Cardiac Monitoring:  The patient was maintained on a cardiac monitor.  I personally viewed and interpreted the cardiac monitored which showed an underlying rhythm of: ***   Medicines ordered and prescription drug management:  I ordered medication including ***  for ***  Reevaluation of the patient after these medicines showed that the patient {resolved/improved/worsened:23923::improved} I have reviewed the patients home medicines and have made adjustments as needed   Test Considered:  ***   Critical Interventions:  ***   Consultations Obtained:  I requested consultation with the ***,  and discussed lab and imaging findings as well as pertinent plan - they recommend: ***   Problem List / ED Course:  ***   Reevaluation:  After the interventions noted above, I reevaluated the patient and found that they have :{resolved/improved/worsened:23923::improved}   Social Determinants of Health:  ***   Dispostion:  After consideration of the diagnostic results and the patients response to treatment, I feel that the patent would benefit from ***.    {Document critical care time when appropriate  Document review of labs and clinical decision tools ie CHADS2VASC2, etc  Document  your independent review of radiology images and any outside records  Document your discussion with family members, caretakers and with consultants  Document social determinants of health affecting pt's care  Document your decision making why or why not admission, treatments were needed:32947:::1}   Final diagnoses:  None    ED Discharge Orders     None        "

## 2024-06-08 ENCOUNTER — Observation Stay (HOSPITAL_COMMUNITY)

## 2024-06-08 DIAGNOSIS — Z66 Do not resuscitate: Secondary | ICD-10-CM | POA: Diagnosis present

## 2024-06-08 DIAGNOSIS — N179 Acute kidney failure, unspecified: Secondary | ICD-10-CM | POA: Diagnosis present

## 2024-06-08 DIAGNOSIS — Y833 Surgical operation with formation of external stoma as the cause of abnormal reaction of the patient, or of later complication, without mention of misadventure at the time of the procedure: Secondary | ICD-10-CM | POA: Diagnosis present

## 2024-06-08 DIAGNOSIS — N139 Obstructive and reflux uropathy, unspecified: Secondary | ICD-10-CM | POA: Diagnosis not present

## 2024-06-08 DIAGNOSIS — D539 Nutritional anemia, unspecified: Secondary | ICD-10-CM | POA: Diagnosis present

## 2024-06-08 DIAGNOSIS — F419 Anxiety disorder, unspecified: Secondary | ICD-10-CM | POA: Diagnosis present

## 2024-06-08 DIAGNOSIS — K219 Gastro-esophageal reflux disease without esophagitis: Secondary | ICD-10-CM

## 2024-06-08 DIAGNOSIS — R627 Adult failure to thrive: Secondary | ICD-10-CM | POA: Diagnosis present

## 2024-06-08 DIAGNOSIS — E8809 Other disorders of plasma-protein metabolism, not elsewhere classified: Secondary | ICD-10-CM

## 2024-06-08 DIAGNOSIS — N184 Chronic kidney disease, stage 4 (severe): Secondary | ICD-10-CM

## 2024-06-08 DIAGNOSIS — M797 Fibromyalgia: Secondary | ICD-10-CM | POA: Diagnosis present

## 2024-06-08 DIAGNOSIS — D631 Anemia in chronic kidney disease: Secondary | ICD-10-CM | POA: Diagnosis present

## 2024-06-08 DIAGNOSIS — Z79620 Long term (current) use of immunosuppressive biologic: Secondary | ICD-10-CM | POA: Diagnosis not present

## 2024-06-08 DIAGNOSIS — I2510 Atherosclerotic heart disease of native coronary artery without angina pectoris: Secondary | ICD-10-CM | POA: Diagnosis present

## 2024-06-08 DIAGNOSIS — N390 Urinary tract infection, site not specified: Secondary | ICD-10-CM | POA: Diagnosis not present

## 2024-06-08 DIAGNOSIS — E46 Unspecified protein-calorie malnutrition: Secondary | ICD-10-CM

## 2024-06-08 DIAGNOSIS — T83512A Infection and inflammatory reaction due to nephrostomy catheter, initial encounter: Secondary | ICD-10-CM | POA: Diagnosis present

## 2024-06-08 DIAGNOSIS — Z79899 Other long term (current) drug therapy: Secondary | ICD-10-CM | POA: Diagnosis not present

## 2024-06-08 DIAGNOSIS — I10 Essential (primary) hypertension: Secondary | ICD-10-CM

## 2024-06-08 DIAGNOSIS — E860 Dehydration: Secondary | ICD-10-CM

## 2024-06-08 DIAGNOSIS — I129 Hypertensive chronic kidney disease with stage 1 through stage 4 chronic kidney disease, or unspecified chronic kidney disease: Secondary | ICD-10-CM | POA: Diagnosis present

## 2024-06-08 DIAGNOSIS — Z6824 Body mass index (BMI) 24.0-24.9, adult: Secondary | ICD-10-CM | POA: Diagnosis not present

## 2024-06-08 DIAGNOSIS — E440 Moderate protein-calorie malnutrition: Secondary | ICD-10-CM | POA: Diagnosis present

## 2024-06-08 DIAGNOSIS — Z7902 Long term (current) use of antithrombotics/antiplatelets: Secondary | ICD-10-CM | POA: Diagnosis not present

## 2024-06-08 DIAGNOSIS — Z8673 Personal history of transient ischemic attack (TIA), and cerebral infarction without residual deficits: Secondary | ICD-10-CM | POA: Diagnosis not present

## 2024-06-08 DIAGNOSIS — M069 Rheumatoid arthritis, unspecified: Secondary | ICD-10-CM | POA: Diagnosis present

## 2024-06-08 DIAGNOSIS — D638 Anemia in other chronic diseases classified elsewhere: Secondary | ICD-10-CM | POA: Diagnosis not present

## 2024-06-08 DIAGNOSIS — N136 Pyonephrosis: Secondary | ICD-10-CM | POA: Diagnosis present

## 2024-06-08 DIAGNOSIS — B961 Klebsiella pneumoniae [K. pneumoniae] as the cause of diseases classified elsewhere: Secondary | ICD-10-CM | POA: Diagnosis present

## 2024-06-08 DIAGNOSIS — Z8249 Family history of ischemic heart disease and other diseases of the circulatory system: Secondary | ICD-10-CM | POA: Diagnosis not present

## 2024-06-08 LAB — CBC
HCT: 27.6 % — ABNORMAL LOW (ref 36.0–46.0)
Hemoglobin: 8.5 g/dL — ABNORMAL LOW (ref 12.0–15.0)
MCH: 32.3 pg (ref 26.0–34.0)
MCHC: 30.8 g/dL (ref 30.0–36.0)
MCV: 104.9 fL — ABNORMAL HIGH (ref 80.0–100.0)
Platelets: 487 K/uL — ABNORMAL HIGH (ref 150–400)
RBC: 2.63 MIL/uL — ABNORMAL LOW (ref 3.87–5.11)
RDW: 15.1 % (ref 11.5–15.5)
WBC: 19.5 K/uL — ABNORMAL HIGH (ref 4.0–10.5)
nRBC: 0 % (ref 0.0–0.2)

## 2024-06-08 LAB — COMPREHENSIVE METABOLIC PANEL WITH GFR
ALT: 5 U/L (ref 0–44)
AST: 18 U/L (ref 15–41)
Albumin: 2.4 g/dL — ABNORMAL LOW (ref 3.5–5.0)
Alkaline Phosphatase: 194 U/L — ABNORMAL HIGH (ref 38–126)
Anion gap: 12 (ref 5–15)
BUN: 17 mg/dL (ref 8–23)
CO2: 22 mmol/L (ref 22–32)
Calcium: 7.8 mg/dL — ABNORMAL LOW (ref 8.9–10.3)
Chloride: 105 mmol/L (ref 98–111)
Creatinine, Ser: 2.11 mg/dL — ABNORMAL HIGH (ref 0.44–1.00)
GFR, Estimated: 23 mL/min — ABNORMAL LOW
Glucose, Bld: 111 mg/dL — ABNORMAL HIGH (ref 70–99)
Potassium: 4.2 mmol/L (ref 3.5–5.1)
Sodium: 139 mmol/L (ref 135–145)
Total Bilirubin: 0.2 mg/dL (ref 0.0–1.2)
Total Protein: 5.6 g/dL — ABNORMAL LOW (ref 6.5–8.1)

## 2024-06-08 LAB — VITAMIN B12: Vitamin B-12: 1723 pg/mL — ABNORMAL HIGH (ref 180–914)

## 2024-06-08 LAB — PHOSPHORUS: Phosphorus: 3.3 mg/dL (ref 2.5–4.6)

## 2024-06-08 LAB — MAGNESIUM: Magnesium: 2.1 mg/dL (ref 1.7–2.4)

## 2024-06-08 MED ORDER — SODIUM CHLORIDE 0.9 % IV SOLN: INTRAVENOUS | Status: AC

## 2024-06-08 MED ORDER — PANTOPRAZOLE SODIUM 40 MG PO TBEC
40.0000 mg | DELAYED_RELEASE_TABLET | Freq: Every day | ORAL | Status: DC
  Administered 2024-06-08 – 2024-06-14 (×7): 40 mg via ORAL
  Filled 2024-06-08 (×7): qty 1

## 2024-06-08 MED ORDER — ENSURE PLUS HIGH PROTEIN PO LIQD: 237.0000 mL | Freq: Two times a day (BID) | ORAL | Status: DC

## 2024-06-08 MED ORDER — SODIUM CHLORIDE 0.9 % IV BOLUS
1000.0000 mL | Freq: Once | INTRAVENOUS | Status: AC
  Administered 2024-06-08: 1000 mL via INTRAVENOUS

## 2024-06-08 MED ORDER — PROSOURCE PLUS PO LIQD
30.0000 mL | Freq: Two times a day (BID) | ORAL | Status: DC
  Administered 2024-06-08 – 2024-06-14 (×9): 30 mL via ORAL
  Filled 2024-06-08 (×9): qty 30

## 2024-06-08 MED ORDER — BOOST / RESOURCE BREEZE PO LIQD CUSTOM
1.0000 | Freq: Three times a day (TID) | ORAL | Status: DC
  Administered 2024-06-08 – 2024-06-12 (×7): 1 via ORAL

## 2024-06-08 MED ORDER — ADULT MULTIVITAMIN W/MINERALS CH
1.0000 | ORAL_TABLET | Freq: Every day | ORAL | Status: DC
  Administered 2024-06-08 – 2024-06-13 (×6): 1 via ORAL
  Filled 2024-06-08 (×7): qty 1

## 2024-06-08 MED ORDER — ACETAMINOPHEN 325 MG PO TABS
650.0000 mg | ORAL_TABLET | Freq: Four times a day (QID) | ORAL | Status: DC | PRN
  Administered 2024-06-08 – 2024-06-14 (×7): 650 mg via ORAL
  Filled 2024-06-08 (×7): qty 2

## 2024-06-08 MED ORDER — ONDANSETRON HCL 4 MG/2ML IJ SOLN
4.0000 mg | Freq: Four times a day (QID) | INTRAMUSCULAR | Status: DC | PRN
  Administered 2024-06-11 – 2024-06-12 (×5): 4 mg via INTRAVENOUS
  Filled 2024-06-08 (×5): qty 2

## 2024-06-08 MED ORDER — HEPARIN SODIUM (PORCINE) 5000 UNIT/ML IJ SOLN
5000.0000 [IU] | Freq: Three times a day (TID) | INTRAMUSCULAR | Status: DC
  Administered 2024-06-08 – 2024-06-10 (×6): 5000 [IU] via SUBCUTANEOUS
  Filled 2024-06-08 (×6): qty 1

## 2024-06-08 MED ORDER — AMLODIPINE BESYLATE 5 MG PO TABS
2.5000 mg | ORAL_TABLET | Freq: Every day | ORAL | Status: DC
  Administered 2024-06-08 – 2024-06-10 (×3): 2.5 mg via ORAL
  Filled 2024-06-08 (×3): qty 1

## 2024-06-08 MED ORDER — ONDANSETRON HCL 4 MG PO TABS
4.0000 mg | ORAL_TABLET | Freq: Four times a day (QID) | ORAL | Status: DC | PRN
  Administered 2024-06-08 – 2024-06-14 (×10): 4 mg via ORAL
  Filled 2024-06-08 (×11): qty 1

## 2024-06-08 MED ORDER — METOPROLOL TARTRATE 25 MG PO TABS
12.5000 mg | ORAL_TABLET | Freq: Two times a day (BID) | ORAL | Status: DC
  Administered 2024-06-08 – 2024-06-14 (×13): 12.5 mg via ORAL
  Filled 2024-06-08 (×14): qty 1

## 2024-06-08 NOTE — Progress Notes (Signed)
 Patient arrived to unit at 0315. Midline to left upper arm was leaking normal saline that was infusing at the time. Charge nurse changed dressing and flushed with midline cont to leak. Staff was able to insert #20 gauge to right AC with patient tolerating w/o diff. Writer made Dr. Manfred aware of midline leaking.

## 2024-06-08 NOTE — Progress Notes (Signed)
 Initial Nutrition Assessment  DOCUMENTATION CODES:   Not applicable  INTERVENTION:   -Continue clear liquid diet; RD will follow for diet advancement and adjust supplement regimen as appropriate -D/c Ensure Plus High Protein po BID, each supplement provides 350 kcal and 20 grams of protein  -Boost Breeze po TID, each supplement provides 250 kcal and 9 grams of protein  -30 ml Prosource Plus BID, each supplement provides 100 kcals and 15 grams protein -MVI with minerals daily   NUTRITION DIAGNOSIS:   Inadequate oral intake related to poor appetite as evidenced by per patient/family report.  GOAL:   Patient will meet greater than or equal to 90% of their needs  MONITOR:   PO intake, Supplement acceptance, Diet advancement  REASON FOR ASSESSMENT:   Consult Assessment of nutrition requirement/status  ASSESSMENT:   78 y.o. 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  She states that eating or drinking make her sick to her stomach.  Patient admitted with failure to thrive in adult and AKI on CKD 4.   Reviewed I/O's: +600 ml x 24 hours  UOP: 400 ml x 24 hours  Patient unavailable at time of visit. Attempted to speak with patient via call to hospital room phone, however, unable to reach. RD unable to obtain further nutrition-related history or complete nutrition-focused physical exam at this time.    Per chart review, patient is from [facility]. Per MAR, patient receives 120 ml MedPass at 6 PM daily.   Reviewed weight history. Patient has experienced a 12.3% weight loss over the past month, which is significant for time frame. Per nursing assessment, patient also with mild edema, which may be masking further weight loss as well as possible fat and  muscle depletions.   Patient is currently on a clear liquid diet. Suspect insufficient intake while on this diet due to limitations of diet. Per H&P, patient had not been eating or drinking for several days PTA due to pain.   Highly suspect malnutrition given poor oral intake, significant weight loss, and multiple co-morbidities, however, unable to identify at this time. Patient would greatly benefit from addition of oral nutrition supplements.   Medications reviewed and include protonix  ans 0.9% sodium chloride  infusion @ 70 ml/hr.   Labs reviewed: CBGS: 106 (inpatient orders for glycemic control are none).    Diet Order:   Diet Order             Diet clear liquid Room service appropriate? Yes; Fluid consistency: Thin  Diet effective now                   EDUCATION NEEDS:   No education needs have been identified at this time  Skin:  Skin Assessment: Reviewed RN Assessment  Last BM:  06/08/24 (type 6)  Height:   Ht Readings from Last 1 Encounters:  06/08/24 5' 6 (1.676 m)    Weight:   Wt Readings from Last 1 Encounters:  06/08/24 67.5 kg    Ideal Body Weight:  59.1 kg  BMI:  Body mass index is 24.02 kg/m.  Estimated Nutritional Needs:   Kcal:  2000-2200  Protein:  100-115 grams  Fluid:  2.0-2.2 L    Margery ORN, RD, LDN, CDCES Registered Dietitian III Certified Diabetes Care and Education Specialist If unable to reach  this RD, please use RD Inpatient group chat on secure chat between hours of 8am-4 pm daily

## 2024-06-08 NOTE — Progress Notes (Signed)
 Patient seen and examined; admitted after midnight secondary to failure to thrive, dehydration and general malaise.  After discussing with patient and healthcare power of attorney they have been ongoing concerns of intermittent nausea/vomiting and son burning sensation with urination for the last 3 weeks.  Patient presented with a nonfunctioning midline that was discontinue by the time of my evaluation.  Nephrostomy tubes bilaterally in place.  Tolerating clear liquids and demonstrating no chest pain or shortness of breath.  Chronically ill in appearance.  Please refer to H&P written by Dr. Manfred on 06/08/2024 for further info/details on admission.  Plan: - CT abdomen and pelvis without contrast to assess nephrostomy tubes location and rule out any obstruction - Follow urine analysis and if needed assess for urine culture - Continue to maintain adequate hydration - Continue supportive care and as needed antiemetics - Follow clinical response. -After discussing with healthcare power of attorney CODE STATUS has been confirmed to be DNR/DNI.  Eric Nunnery MD [phone]

## 2024-06-08 NOTE — H&P (Signed)
 " History and Physical    Patient: Robin Blackburn FMW:[id number] DOB: Jan 08, 1946 DOA: 06/07/2024 DOS: the patient was seen and examined on 06/08/2024 PCP: Randy Eva, NP  Patient coming from: SNF  Chief Complaint:  Chief Complaint  Patient presents with   Failure To Thrive   HPI: Robin Blackburn is a 78 y.o. female with medical history significant of  hypertension, rheumatoid arthritis, fibromyalgia, chronic pain, prior stroke, bladder outlet obstruction (s/p bilateral nephrostomy tubes) and CKD who presents to the emergency department via EMS due to several days of not eating or drinking or taking her meds.  She states that eating or drinking make her sick to her stomach.  There was no history of fever, chills, nausea, vomiting.  ED course In the emergency department, she was hemodynamically stable.  Workup in the ED showed WBC 18.5, hemoglobin 7.8, hematocrit 24.9, MCV 102.0, platelets 603.  BMP except for bicarb of 19, blood glucose 121, creatinine 2.03 (baseline creatinine 1.2-1.4) Chest x-ray showed left lung base atelectasis or scarring.  No interval change IV hydration was provided.  TRH was asked to admit patient.   Review of Systems: As mentioned in the history of present illness. All other systems reviewed and are negative. 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   Depression    Fibromyalgia    Hypertension    Neuropathy    Rheumatoid arthritis (HCC)    Stroke (HCC) 02/17/14   Past Surgical History:  Procedure Laterality Date   ABDOMINAL HYSTERECTOMY     ANKLE RECONSTRUCTION     APPENDECTOMY     BACK SURGERY     CHOLECYSTECTOMY     IR NEPHROSTOMY PLACEMENT LEFT  04/22/2024   IR NEPHROSTOMY PLACEMENT RIGHT  04/01/2024   KNEE SURGERY     Social History:  reports that she has never smoked. She has never been exposed to tobacco smoke. She has never used smokeless tobacco. She reports  that she does not drink alcohol  and does not use drugs.  Allergies[1]  Family History  Problem Relation Age of Onset   Breast cancer Mother    Hypertension Father    Transient ischemic attack Father    Lung cancer Father    Hypertension Brother    Leukemia Brother    Leukemia Brother    Leukemia Brother    CVA Maternal Grandfather     Prior to Admission medications  Medication Sig Start Date End Date Taking? Authorizing Provider  acetaminophen  (TYLENOL ) 500 MG tablet Take 1,000 mg by mouth 2 (two) times daily.    [provider]  Adalimumab (HUMIRA, 2 PEN,) 40 MG/0.8ML PNKT Inject 40 mg into the skin every 14 (fourteen) days. Patient not taking: Reported on 04/27/2024    [provider]  ALPRAZolam  (XANAX ) 0.5 MG tablet Take 1 tablet (0.5 mg total) by mouth 2 (two) times daily as needed for anxiety. 03/15/24   Amin, Ankit C, MD  amLODipine  (NORVASC ) 2.5 MG tablet Take 2.5 mg by mouth daily. Hold for SBP<110 12/10/23   [provider]  Cholecalciferol  5000 units TABS Take 1 tablet by mouth daily.     [provider]  Cranberry 450 MG TABS Take 1 tablet by mouth in the morning and at bedtime. For UTI    [provider]  cycloSPORINE  (RESTASIS ) 0.05 % ophthalmic emulsion Place 1 drop into both eyes 2 (two)  times daily. Dry eyes    [provider]  estradiol  (ESTRACE ) 0.1 MG/GM vaginal cream Place 1 Applicatorful vaginally every other day. Discard plastic applicator. Insert a blueberry size amount (approximately 1 gram) of cream on fingertip inside vagina at bedtime every other night for long term use. 01/09/24   [name], MD  ferrous sulfate  325 (65 FE) MG EC tablet Take 1 tablet (325 mg total) by mouth daily with breakfast. 02/04/24   [name], PA-C  fluticasone (FLONASE) 50 MCG/ACT nasal spray Place 1 spray into both nostrils 2 (two) times daily.    [provider]  folic acid  (FOLVITE ) 1 MG tablet Take 1 mg by  mouth daily.    [provider]  gabapentin  (NEURONTIN ) 300 MG capsule Take 1 capsule (300 mg total) by mouth 3 (three) times daily as needed (neuropathy pain symptoms). 01/09/24   [name], MD  Heparin  Sod, Pork, Lock Flush (HEPARIN  FLUSH) 10 UNIT/ML SOLN injection Inject 5 Units into the vein daily. Patient not taking: Reported on 04/27/2024 04/10/24   [provider]  lactobacillus acidophilus (BACID) TABS tablet Take 1 tablet by mouth daily.    [provider]  lansoprazole (PREVACID) 30 MG capsule Take 60 mg by mouth 2 (two) times daily before a meal. DO NOT CHANGE PER MD    [provider]  leflunomide  (ARAVA ) 10 MG tablet Take 10 mg by mouth daily. 08/01/20   [provider]  Melatonin 10 MG TABS Take 10 mg by mouth at bedtime.    [provider]  Menthol-Methyl Salicylate (MUSCLE RUB) 10-15 % CREA Apply 1 Application topically every 6 (six) hours as needed (Neck pain).    [provider]  methenamine  (HIPREX ) 1 g tablet Take 1 tablet (1 g total) by mouth 2 (two) times daily. Prophylactic 01/22/24   [name], [name], MD  metoprolol  tartrate (LOPRESSOR ) 25 MG tablet Take 0.5 tablets (12.5 mg total) by mouth 2 (two) times daily. 01/09/24   [name], MD  Multiple Vitamin (MULTIVITAMIN PO) Take 1 tablet by mouth 2 (two) times daily.    [provider]  Nutritional Supplement LIQD Take 120 mLs by mouth daily at 6 PM. House supplement on [facility]    [provider]  ondansetron  (ZOFRAN ) 4 MG tablet Take 4 mg by mouth every 8 (eight) hours as needed for vomiting or nausea. 02/13/24   [provider]  ondansetron  (ZOFRAN ) 8 MG tablet Take 8 mg by mouth 3 (three) times daily. 04/26/24   [provider]  senna (SENOKOT) 8.6 MG tablet Take 1 tablet by mouth 2 (two) times daily.    [provider]  ticagrelor  (BRILINTA ) 60 MG TABS tablet Take 60 mg by mouth daily.    [provider]  traMADol  (ULTRAM ) 50 MG tablet Take 1 tablet (50 mg total) by mouth every 8 (eight) hours as needed for severe pain (pain score 7-10). Patient taking differently: Take 50 mg by mouth every 6 (six) hours as needed for severe pain (pain score 7-10). 03/15/24   Amin, Ankit C, MD  traZODone  (DESYREL ) 100 MG tablet Take 100 mg by mouth at bedtime. 02/25/23   [provider]  vitamin B-12 (CYANOCOBALAMIN ) 250 MCG tablet Take 250 mcg by mouth daily.    [provider]    Physical Exam: Vitals:   06/07/24 2045 06/07/24 2330 06/07/24 2356 06/08/24 0015  BP: (!) 102/91 120/64  120/66  Pulse: 98 98  98  Resp: (!) 24 (!) 24  (!) 24  Temp:   99 F (37.2 C)   TempSrc:   Oral   SpO2: 95% 90%  97%  Weight:      Height:       General: Elderly female. Ill appearing.  Awake and alert and oriented x3. Not in any acute distress.  HEENT: NCAT.  PERRLA. EOMI. Sclerae anicteric.  Dry mucosal membranes. Neck: Neck supple without lymphadenopathy. No carotid bruits. No masses palpated.  Cardiovascular: Regular rate with normal S1-S2 sounds. No murmurs, rubs or gallops auscultated. No JVD.  Respiratory: Clear breath sounds.  No accessory muscle use. Abdomen: Soft, nontender, nondistended. Active bowel sounds. No masses or hepatosplenomegaly Genitourinary: Bilateral nephrostomy tube with only left nephrostomy tube draining Skin: No rashes, lesions, or ulcerations.  Dry, warm to touch. Musculoskeletal:  2+ dorsalis pedis and radial pulses. Good ROM.  No contractures  Psychiatric:  Mood appropriate to current condition. Neurologic: No focal neurological deficits.CN II - XII grossly intact.   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 Dehydration creatinine 2.03 (baseline creatinine 1.2-1.4) Continue gentle hydration Renally adjust medications, avoid nephrotoxic  agents/dehydration/hypotension  Macrocytic anemia MCV 102.0, H/H 7.8/24.9 Vitamin B12 and folate levels will be checked  Obstructive uropathy status post bilateral nephrostomy tube placement Noted left nephrostomy tube draining urine, none in the right nephrostomy tube at this time  Hypoalbuminemia possibly secondary to moderate protein calorie malnutrition Albumin 2.3, protein supplement will be provided   Essential Hypertension Continue metoprolol  twice daily,  Continue Norvasc     GERD Continue Protonix      Advance Care Planning: DNR  Consults: Dietitian  Family Communication: Friend at bedside  Severity of Illness: The appropriate patient status for this patient is OBSERVATION. Observation status is judged to be reasonable and necessary in order to provide the required intensity of service to ensure the patient's safety. The patient's presenting symptoms, physical exam findings, and initial radiographic and laboratory data in the context of their medical condition is felt to place them at decreased risk for further clinical deterioration. Furthermore, it is anticipated that the patient will be medically stable for discharge from the hospital within 2 midnights of admission.   Author: Posey Maier, DO 06/08/2024 12:36 AM  For on call review [email or web address].      [1]  Allergies Allergen Reactions   Cortisone Anaphylaxis    Cardiovascular Arrest   Fentanyl  Anaphylaxis, Hives and Other (See Comments)    felt like I had demons in my head   Hydromorphone  Anaphylaxis and Nausea And Vomiting    GI Intolerance   Iodinated Contrast Media Anaphylaxis, Hives, Rash and Dermatitis    Respiratory Distress  Iodinated contrast media (substance)   Keflex  [Cephalexin ] Nausea And Vomiting   Meperidine Anaphylaxis and Shortness Of Breath    Respiratory Distress   Morphine Anaphylaxis, Nausea And Vomiting, Swelling and Rash    GI Intolerance   Oxycodone -Acetaminophen   Anaphylaxis, Nausea And Vomiting, Swelling, Dermatitis and Rash    GI Intolerance, Mouth swelling.  acetaminophen  / oxycodone    Penicillins Anaphylaxis, Nausea And Vomiting and Other (See Comments)    Immediate rash, facial/tongue/throat swelling, SOB or lightheadedness with hypotension  Product containing penicillin (product)   Afluria Preservative Free [Influenza Virus Vacc Split Pf] Other (See Comments)    Unknown   Aspirin  Other (See Comments)    GI Intolerance   Codeine Other (  See Comments)    Unknown    Influenza Virus Vaccine Other (See Comments)    Unknown    Meperidine Hcl Other (See Comments)    Unknown   Oxycodone  Hcl Other (See Comments)    Unknown   Shellfish Allergy Other (See Comments)    Glucosamine not an option Unknown    Sulfa  Antibiotics Nausea And Vomiting    Tolerates Bactrim  though   Troleandomycin Nausea And Vomiting and Swelling    GI Intolerance   Potassium Chloride  Other (See Comments)    No reaction listed on MAR   Bisphosphonates Hives and Other (See Comments)    GI intolerance   Ciprofloxacin  Nausea Only and Rash   Levaquin [Levofloxacin In D5w] Nausea And Vomiting and Rash    Mental Status Changes, Confusion   Oxytetracycline Nausea And Vomiting   Wheat Rash   "

## 2024-06-08 NOTE — Plan of Care (Signed)

## 2024-06-08 NOTE — Progress Notes (Signed)
 Midline removed and length was 15 cm instead of 3 cm documented on admission .  Dressing applied, dry and intact with no bleeding.

## 2024-06-08 NOTE — ED Notes (Signed)
 In and out was performed no urine output

## 2024-06-08 NOTE — NC FL2 (Signed)
 "   MEDICAID FL2 LEVEL OF CARE FORM     IDENTIFICATION  Patient Name: Robin Blackburn Birthdate: Jul 12, 1945 Sex: female Admission Date (Current Location): 06/07/2024  [facility] and [place] Number:  [employer] and Address:  [facility],  [address], [place] [address]      Provider Number: [id number]  Attending Physician Name and Address:  Ricky Fines, MD  Relative Name and Phone Number:  Chandler,Janell [facility])  Friend, Emergency Contact  [phone]    Current Level of Care: Hospital Recommended Level of Care: Skilled Nursing Facility Prior Approval Number:    Date Approved/Denied:   PASRR Number:    Discharge Plan: SNF    Current Diagnoses: Patient Active Problem List   Diagnosis Date Noted   Failure to thrive in adult 06/08/2024   Bilateral pleural effusion 03/29/2024   Obstructive uropathy 03/29/2024   Macrocytic anemia 03/29/2024   Hypoalbuminemia due to protein-calorie malnutrition 03/29/2024   Acute kidney injury superimposed on stage 5 chronic kidney disease, not on chronic dialysis (HCC) 03/28/2024   Dysarthria 03/14/2024   Acute-on-chronic kidney injury 03/10/2024   Hyperkalemia 03/10/2024   Bladder outlet obstruction 03/10/2024   Pneumonia 02/18/2024   Urinary tract infection 01/04/2024   Pulmonary nodules 01/27/2023   Bilateral hearing loss 07/01/2022   Chronic ear pain, bilateral 07/01/2022   Spinal stenosis of lumbar region 11/22/2021   OAB (overactive bladder) 05/21/2020   Osteoarthritis of right knee 06/01/2019   Weakness    Metabolic acidosis    Recurrent UTI 11/25/2016   Benzodiazepine withdrawal with perceptual disturbance (HCC) 04/02/2016   Osteoporosis 11/01/2015   Vitamin D  deficiency 10/11/2015   History of fracture of left ankle 09/28/2015   Chronic pain in left foot 09/28/2015   Fibromyalgia syndrome 09/28/2015   GERD (gastroesophageal reflux disease) 04/05/2015   Diarrhea 01/05/2015   Chronic pain  syndrome 12/02/2014   Back pain, lumbosacral 06/23/2014   Sinusitis, chronic 06/17/2014   H/O: CVA (cerebrovascular accident) 05/24/2014   HLD (hyperlipidemia) 05/24/2014   Physical deconditioning 05/24/2014   Right sided weakness 02/26/2014   Rheumatoid arthritis (HCC) 02/20/2014   Essential hypertension 02/19/2014   Neuropathy 02/19/2014   Anxiety 02/19/2014    Orientation RESPIRATION BLADDER Height & Weight     Self, Situation, Place  Normal Continent Weight: 148 lb 13 oz (67.5 kg) Height:  5' 6 (167.6 cm)  BEHAVIORAL SYMPTOMS/MOOD NEUROLOGICAL BOWEL NUTRITION STATUS      Continent Diet (See D/C summary)  AMBULATORY STATUS COMMUNICATION OF NEEDS Skin   Extensive Assist Verbally Normal                       Personal Care Assistance Level of Assistance  Bathing, Dressing, Feeding Bathing Assistance: Maximum assistance Feeding assistance: Limited assistance Dressing Assistance: Maximum assistance     Functional Limitations Info  Sight, Hearing, Speech Sight Info: Impaired Hearing Info: Adequate Speech Info: Adequate    SPECIAL CARE FACTORS FREQUENCY  PT (By licensed PT), OT (By licensed OT)     PT Frequency: 5 times weekly OT Frequency: 5 times weekly            Contractures Contractures Info: Not present    Additional Factors Info  Code Status, Allergies Code Status Info: DNR- Limited Allergies Info: Cortisone, Fentanyl , Hydromorphone , Iodinated Contrast Media, Keflex  (Cephalexin ), Meperidine, Morphine, Oxycodone -acetaminophen , Penicillins, Afluria Preservative Free (Influenza Virus Vacc Split Pf), Aspirin , Codeine, Influenza Virus Vaccine, Meperidine Hcl, Oxycodone  Hcl, Shellfish Allergy, Sulfa  Antibiotics, Troleandomycin,  Potassium Chloride , Bisphosphonates, Ciprofloxacin , Levaquin (Levofloxacin In D5w), Oxytetracycline, Wheat           Current Medications (06/08/2024):  This is the current hospital active medication list Current  Facility-Administered Medications  Medication Dose Route Frequency Provider Last Rate Last Admin   (feeding supplement) PROSource Plus liquid 30 mL  30 mL Oral BID BM Ricky Fines, MD   30 mL at 06/08/24 1124   0.9 %  sodium chloride  infusion   Intravenous Continuous Adefeso, Oladapo, DO 70 mL/hr at 06/08/24 0539 New Bag at 06/08/24 0539   acetaminophen  (TYLENOL ) tablet 650 mg  650 mg Oral Q6H PRN Ricky Fines, MD   650 mg at 06/08/24 9156   amLODipine  (NORVASC ) tablet 2.5 mg  2.5 mg Oral Daily Adefeso, Oladapo, DO   2.5 mg at 06/08/24 9167   feeding supplement (BOOST / RESOURCE BREEZE) liquid 1 Container  1 Container Oral TID BM Ricky Fines, MD   1 Container at 06/08/24 1124   heparin  injection 5,000 Units  5,000 Units Subcutaneous Q8H Adefeso, Oladapo, DO   5,000 Units at 06/08/24 0532   metoprolol  tartrate (LOPRESSOR ) tablet 12.5 mg  12.5 mg Oral BID Adefeso, Oladapo, DO   12.5 mg at 06/08/24 9167   multivitamin with minerals tablet 1 tablet  1 tablet Oral Daily Ricky Fines, MD   1 tablet at 06/08/24 1122   ondansetron  (ZOFRAN ) tablet 4 mg  4 mg Oral Q6H PRN Adefeso, Oladapo, DO       Or   ondansetron  (ZOFRAN ) injection 4 mg  4 mg Intravenous Q6H PRN Adefeso, Oladapo, DO       pantoprazole  (PROTONIX ) EC tablet 40 mg  40 mg Oral Daily Adefeso, Oladapo, DO   40 mg at 06/08/24 9166     Discharge Medications: Please see discharge summary for a list of discharge medications.  Relevant Imaging Results:  Relevant Lab Results:   Additional Information SSN: [id number]  Lucie Lunger, LCSWA     "

## 2024-06-08 NOTE — TOC Initial Note (Signed)
 Transition of Care [facility]) - Initial/Assessment Note    Patient Details  Name: Robin Blackburn MRN: [id number] Date of Birth: 10-14-1945  [facility] CM/SW Contact:    Lucie Lunger, LCSWA Phone Number: 06/08/2024, 12:27 PM  Clinical Narrative:                 CSW notes per chart review that pt is long term care at [facility]. [facility] spoke with De[facility] at [facility] who states pt has been a resident with them since 2017 and pt has a private room at their facility. Marval states pt will not need auth to return. CSW updated by RN that pts POA would like to speak with CSW. CSW met with pts POA, pt was in [place] at the time. Pts POA states she has talked with pt and they would like to look into a different facility. CSW explained that referral can be sent however, pt may need to return to [facility] and work with the child psychotherapist at the facility. TOC to follow.   Expected Discharge Plan: Long Term Nursing Home Barriers to Discharge: Continued Medical Work up   Patient Goals and CMS Choice Patient states their goals for this hospitalization and ongoing recovery are:: go to LTC CMS Medicare.gov Compare Post Acute Care list provided to:: Patient Choice offered to / list presented to : Patient, [facility] POA / Guardian      Expected Discharge Plan and Services In-house Referral: Clinical Social Work Discharge Planning Services: CM Consult Post Acute Care Choice: Nursing Home Living arrangements for the past 2 months: Skilled Nursing Facility                                      Prior Living Arrangements/Services Living arrangements for the past 2 months: Skilled Nursing Facility Lives with:: Facility Resident Patient language and need for interpreter reviewed:: Yes Do you feel safe going back to the place where you live?: Yes      Need for Family Participation in Patient Care: Yes (Comment) Care giver support system in place?: Yes (comment)   Criminal Activity/Legal Involvement Pertinent  to Current Situation/Hospitalization: No - Comment as needed  Activities of Daily Living   ADL Screening (condition at time of admission) Independently performs ADLs?: No Does the patient have a NEW difficulty with bathing/dressing/toileting/self-feeding that is expected to last >3 days?: No Does the patient have a NEW difficulty with getting in/out of bed, walking, or climbing stairs that is expected to last >3 days?: No Does the patient have a NEW difficulty with communication that is expected to last >3 days?: No Is the patient deaf or have difficulty hearing?: No Does the patient have difficulty seeing, even when wearing glasses/contacts?: No Does the patient have difficulty concentrating, remembering, or making decisions?: No  Permission Sought/Granted                  Emotional Assessment Appearance:: Appears stated age Attitude/Demeanor/Rapport: Engaged Affect (typically observed): Accepting Orientation: : Oriented to Self, Oriented to Place, Oriented to Situation Alcohol  / Substance Use: Not Applicable Psych Involvement: No (comment)  Admission diagnosis:  Dehydration [E86.0] Failure to thrive in adult [R62.7] FTT (failure to thrive) in adult [R62.7] Anemia, unspecified type [D64.9] Patient Active Problem List   Diagnosis Date Noted   Failure to thrive in adult 06/08/2024   Bilateral pleural effusion 03/29/2024   Obstructive uropathy 03/29/2024  Macrocytic anemia 03/29/2024   Hypoalbuminemia due to protein-calorie malnutrition 03/29/2024   Acute kidney injury superimposed on stage 5 chronic kidney disease, not on chronic dialysis (HCC) 03/28/2024   Dysarthria 03/14/2024   Acute-on-chronic kidney injury 03/10/2024   Hyperkalemia 03/10/2024   Bladder outlet obstruction 03/10/2024   Pneumonia 02/18/2024   Urinary tract infection 01/04/2024   Pulmonary nodules 01/27/2023   Bilateral hearing loss 07/01/2022   Chronic ear pain, bilateral 07/01/2022   Spinal  stenosis of lumbar region 11/22/2021   OAB (overactive bladder) 05/21/2020   Osteoarthritis of right knee 06/01/2019   Weakness    Metabolic acidosis    Recurrent UTI 11/25/2016   Benzodiazepine withdrawal with perceptual disturbance (HCC) 04/02/2016   Osteoporosis 11/01/2015   Vitamin D  deficiency 10/11/2015   History of fracture of left ankle 09/28/2015   Chronic pain in left foot 09/28/2015   Fibromyalgia syndrome 09/28/2015   GERD (gastroesophageal reflux disease) 04/05/2015   Diarrhea 01/05/2015   Chronic pain syndrome 12/02/2014   Back pain, lumbosacral 06/23/2014   Sinusitis, chronic 06/17/2014   H/O: CVA (cerebrovascular accident) 05/24/2014   HLD (hyperlipidemia) 05/24/2014   Physical deconditioning 05/24/2014   Right sided weakness 02/26/2014   Rheumatoid arthritis (HCC) 02/20/2014   Essential hypertension 02/19/2014   Neuropathy 02/19/2014   Anxiety 02/19/2014   PCP:  Randy Eva, NP Pharmacy:   [facility] - [place], [place] - [address] [place] [address] Phone: [phone] Fax: [phone]     Social Drivers of Health (SDOH) Social History: SDOH Screenings   Food Insecurity: No Food Insecurity (06/08/2024)  Housing: Low Risk (06/08/2024)  Transportation Needs: No Transportation Needs (06/08/2024)  Utilities: Not At Risk (06/08/2024)  Alcohol  Screen: Low Risk (01/06/2022)  Depression (PHQ2-9): Low Risk (02/25/2024)  Financial Resource Strain: Low Risk (01/06/2022)  Physical Activity: Inactive (11/07/2022)   [facility] from [facility]  Social Connections: Unknown (06/08/2024)  Recent Concern: Social Connections - Socially Isolated (03/29/2024)  Stress: No Stress Concern Present (01/06/2022)  Tobacco Use: Low Risk (06/07/2024)   SDOH Interventions:     Readmission Risk Interventions    03/29/2024    9:23 AM 03/11/2024    8:52 AM 01/09/2024   11:45 AM  Readmission Risk Prevention Plan  Transportation  Screening Complete Complete Complete  Home Care Screening   Complete  Medication Review (RN CM)   Complete  Medication Review Oceanographer) Complete Complete   HRI or Home Care Consult Complete Complete   SW Recovery Care/Counseling Consult Complete Complete   Palliative Care Screening Not Applicable Not Applicable   Skilled Nursing Facility Complete Complete

## 2024-06-09 ENCOUNTER — Inpatient Hospital Stay (HOSPITAL_COMMUNITY)

## 2024-06-09 HISTORY — PX: IR NEPHROSTOMY PLACEMENT RIGHT: IMG6064

## 2024-06-09 HISTORY — PX: IR NEPHROSTOMY EXCHANGE LEFT: IMG6069

## 2024-06-09 LAB — BASIC METABOLIC PANEL WITH GFR
Anion gap: 8 (ref 5–15)
BUN: 19 mg/dL (ref 8–23)
CO2: 24 mmol/L (ref 22–32)
Calcium: 7.5 mg/dL — ABNORMAL LOW (ref 8.9–10.3)
Chloride: 105 mmol/L (ref 98–111)
Creatinine, Ser: 2.06 mg/dL — ABNORMAL HIGH (ref 0.44–1.00)
GFR, Estimated: 24 mL/min — ABNORMAL LOW
Glucose, Bld: 96 mg/dL (ref 70–99)
Potassium: 3.7 mmol/L (ref 3.5–5.1)
Sodium: 137 mmol/L (ref 135–145)

## 2024-06-09 LAB — URINALYSIS, ROUTINE W REFLEX MICROSCOPIC
Bilirubin Urine: NEGATIVE
Glucose, UA: NEGATIVE mg/dL
Ketones, ur: NEGATIVE mg/dL
Nitrite: POSITIVE — AB
Protein, ur: >300 mg/dL — AB
Specific Gravity, Urine: 1.02 (ref 1.005–1.030)
pH: 7 (ref 5.0–8.0)

## 2024-06-09 LAB — CBC
HCT: 22.8 % — ABNORMAL LOW (ref 36.0–46.0)
Hemoglobin: 7.1 g/dL — ABNORMAL LOW (ref 12.0–15.0)
MCH: 32 pg (ref 26.0–34.0)
MCHC: 31.1 g/dL (ref 30.0–36.0)
MCV: 102.7 fL — ABNORMAL HIGH (ref 80.0–100.0)
Platelets: 381 K/uL (ref 150–400)
RBC: 2.22 MIL/uL — ABNORMAL LOW (ref 3.87–5.11)
RDW: 15.2 % (ref 11.5–15.5)
WBC: 11.7 K/uL — ABNORMAL HIGH (ref 4.0–10.5)
nRBC: 0 % (ref 0.0–0.2)

## 2024-06-09 LAB — URINALYSIS, MICROSCOPIC (REFLEX): WBC, UA: >50 WBC/hpf (ref 0–5)

## 2024-06-09 LAB — FOLATE: Folate: 12.5 ng/mL

## 2024-06-09 MED ORDER — ALPRAZOLAM 0.25 MG PO TABS
0.2500 mg | ORAL_TABLET | Freq: Three times a day (TID) | ORAL | Status: DC | PRN
  Administered 2024-06-09 – 2024-06-14 (×10): 0.25 mg via ORAL
  Filled 2024-06-09 (×11): qty 1

## 2024-06-09 MED ORDER — MELATONIN 3 MG PO TABS
9.0000 mg | ORAL_TABLET | Freq: Every day | ORAL | Status: DC
  Administered 2024-06-09 – 2024-06-13 (×5): 9 mg via ORAL
  Filled 2024-06-09 (×5): qty 3

## 2024-06-09 MED ORDER — MIDAZOLAM HCL (PF) 2 MG/2ML IJ SOLN
INTRAMUSCULAR | Status: AC | PRN
  Administered 2024-06-09: 1 mg via INTRAVENOUS

## 2024-06-09 MED ORDER — GADOBUTROL 1 MMOL/ML IV SOLN: INTRAVENOUS | 0 refills | Status: DC

## 2024-06-09 MED ORDER — DIPHENHYDRAMINE HCL 50 MG/ML IJ SOLN
INTRAMUSCULAR | Status: AC | PRN
  Administered 2024-06-09: 50 mg via INTRAVENOUS

## 2024-06-09 MED ORDER — LACTATED RINGERS IV SOLN: INTRAVENOUS | Status: AC

## 2024-06-09 MED ORDER — MIDAZOLAM HCL 2 MG/2ML IJ SOLN
INTRAMUSCULAR | Status: AC
  Filled 2024-06-09: qty 2

## 2024-06-09 MED ORDER — LIDOCAINE-EPINEPHRINE 1 %-1:100000 IJ SOLN
INTRAMUSCULAR | Status: AC
  Filled 2024-06-09: qty 20

## 2024-06-09 MED ORDER — LIDOCAINE-EPINEPHRINE 1 %-1:100000 IJ SOLN
20.0000 mL | Freq: Once | INTRAMUSCULAR | Status: AC
  Administered 2024-06-09: 7 mL via INTRADERMAL
  Filled 2024-06-09: qty 20

## 2024-06-09 MED ORDER — ESTRADIOL 0.1 MG/GM VA CREA
1.0000 | TOPICAL_CREAM | VAGINAL | Status: DC
  Administered 2024-06-09 – 2024-06-13 (×3): 1 via VAGINAL
  Filled 2024-06-09 (×2): qty 42.5

## 2024-06-09 MED ORDER — TRAMADOL HCL 50 MG PO TABS: 50.0000 mg | ORAL_TABLET | Freq: Four times a day (QID) | ORAL | Status: DC | PRN

## 2024-06-09 MED ORDER — TRAZODONE HCL 50 MG PO TABS: 75.0000 mg | ORAL_TABLET | Freq: Every evening | ORAL | Status: DC | PRN

## 2024-06-09 MED ORDER — SODIUM CHLORIDE 0.9 % IV SOLN
1000.0000 mg | Freq: Two times a day (BID) | INTRAVENOUS | Status: DC
  Administered 2024-06-09 – 2024-06-13 (×9): 1000 mg via INTRAVENOUS
  Filled 2024-06-09 (×9): qty 20

## 2024-06-09 MED ORDER — DIPHENHYDRAMINE HCL 50 MG/ML IJ SOLN
INTRAMUSCULAR | Status: AC
  Filled 2024-06-09: qty 1

## 2024-06-09 MED ORDER — TRAMADOL HCL 50 MG PO TABS
50.0000 mg | ORAL_TABLET | Freq: Two times a day (BID) | ORAL | Status: DC | PRN
  Administered 2024-06-09 – 2024-06-12 (×6): 50 mg via ORAL
  Filled 2024-06-09 (×6): qty 1

## 2024-06-09 MED ORDER — LIDOCAINE-EPINEPHRINE 1 %-1:100000 IJ SOLN
20.0000 mL | Freq: Once | INTRAMUSCULAR | Status: DC
  Filled 2024-06-09: qty 30

## 2024-06-09 MED ORDER — SODIUM CHLORIDE 0.9% FLUSH
5.0000 mL | Freq: Three times a day (TID) | INTRAVENOUS | Status: DC
  Administered 2024-06-09 – 2024-06-14 (×15): 5 mL

## 2024-06-09 MED ORDER — CYCLOSPORINE 0.05 % OP EMUL
1.0000 [drp] | Freq: Two times a day (BID) | OPHTHALMIC | Status: DC
  Administered 2024-06-09 – 2024-06-14 (×11): 1 [drp] via OPHTHALMIC
  Filled 2024-06-09 (×11): qty 30

## 2024-06-09 NOTE — Plan of Care (Signed)

## 2024-06-09 NOTE — Progress Notes (Signed)
 " PROGRESS NOTE    Robin Blackburn  FMW:[id number] DOB: [birth year] DOA: 06/07/2024  PCP: Robin Eva, NP    Chief Complaint  Patient presents with   Failure To Thrive    Brief admission narrative:  As per H&P written by Dr. Manfred on 06/08/2024 Rock Robin Blackburn is a 79 y.o. female with medical history significant of  hypertension, rheumatoid arthritis, fibromyalgia, chronic pain, prior stroke, bladder outlet obstruction (s/p bilateral nephrostomy tubes) and CKD who presents to the emergency department via EMS due to several days of not eating or drinking or taking her meds.  She states that eating or drinking make her sick to her stomach.  There was no history of fever, chills, nausea, vomiting.   ED course In the emergency department, she was hemodynamically stable.  Workup in the ED showed WBC 18.5, hemoglobin 7.8, hematocrit 24.9, MCV 102.0, platelets 603.  BMP except for bicarb of 19, blood glucose 121, creatinine 2.03 (baseline creatinine 1.2-1.4) Chest x-ray showed left lung base atelectasis or scarring.  No interval change IV hydration was provided.  TRH was asked to admit patient.  Assessment & Plan: 1-failure to thrive/dehydration - Patient complaining of intermittent nausea - Continue as needed antiemetics - Continue to maintain adequate hydration - Appreciate assistance and recommendation by dietitian service and will continue feeding supplement - Continue to advance diet as tolerated.  2-SIRS/UTI/obstructive uropathy - Patient with history of recurring UTIs and chronic bladder outlet obstruction status post bilateral nephrostomy tube - Presenting with complaint of dysuria and urinalysis suggesting the presence of UTI - Per records prior history of ESBL appreciated - Meropenem  has been started - Continue fluid resuscitation and supportive care - CT scan demonstrating moderate right-sided hydronephrosis with dislodgment of right nephrostomy tube - Case has been discussed  with urology service Dr. Elisabeth who recommended transfer to [facility] for further evaluation and management; patient might require intervention for repositioning/exchange by IR.  3-acute kidney injury on chronic kidney disease stage IV - In the setting of obstructive uropathy, UTI and dehydration - Continue minimizing the use of nephrotoxic agents - Continue to maintain adequate hydration - Follow renal function trend - Follow recommendations by urology service to further assist/alleviate obstructive uropathy process - Will continue antibiotics for UTI component.  4-essential hypertension - Continue the use of metoprolol  and low-dose Norvasc  - Follow vital signs.  5-GERD - Continue PPI.  6-history of CVA - No acute complaints of neurological deficits - Continue risk factor modification - Patient chronic use of Brilinta  currently on hold in the setting of anticipated interventions - Resume once appropriate.  7-history of rheumatoid arthritis - Holding leflunomide  in the setting of acute infection currently - Continue as needed analgesic therapy.   DVT prophylaxis: Heparin  Code Status: DNR/DNI. Family Communication: Healthcare power of attorney updated at bedside on 06/08/24 Disposition:   Status is: Inpatient Remains inpatient appropriate because: Continue IV therapy/IV antibiotics.   Consultants:  Urology service (Dr. Elisabeth)  Procedures:  See below for x-ray reports.  Antimicrobials:  Meropenem    Subjective: Low-grade temperature appreciated overnight; patient reports intermittent nausea and decreased appetite.  No chest pain, no shortness of breath, no palpitations and no active vomiting currently appreciated.  Patient has also reported burning sensation with urination.  Objective: Vitals:   06/08/24 1235 06/08/24 1401 06/08/24 1900 06/09/24 0537  BP:  (!) 102/45 (!) 122/54 100/80  Pulse:  73 66 98  Resp:  17 14 18   Temp: (!) 97.5 F (36.4 C)  98.1 F  (36.7 C) 97.8 F (36.6 C) 98 F (36.7 C)  TempSrc: Oral Oral Oral Oral  SpO2:  97% 99% 98%  Weight:      Height:        Intake/Output Summary (Last 24 hours) at 06/09/2024 0827 Last data filed at 06/08/2024 2300 Gross per 24 hour  Intake 120 ml  Output 150 ml  Net -30 ml   Filed Weights   06/07/24 1733 06/08/24 0315 06/08/24 0348  Weight: 77 kg 67.5 kg 67.5 kg    Examination:  General exam: Appears calm and in no major distress; reported intermittent nausea and decreased appetite.  Low-grade fever appreciated overnight. Respiratory system: Good saturation on room air. Cardiovascular system: S1 & S2 heard, RRR. No JVD, murmurs, rubs, gallops or clicks. No pedal edema. Gastrointestinal system: Abdomen is nondistended, soft and nontender. No organomegaly or masses felt. Normal bowel sounds heard. Central nervous system: No focal neurological deficits. Extremities: No cyanosis or clubbing. Skin: No petechiae; bilateral nephrostomy tubes in place. Psychiatry: Judgement and insight appear normal. Mood & affect appropriate.     Data Reviewed: I have personally reviewed following labs and imaging studies  CBC: Recent Labs  Lab 06/07/24 1848 06/08/24 0556 06/09/24 0511  WBC 18.5* 19.5* 11.7*  NEUTROABS 15.8*  --   --   HGB 7.8* 8.5* 7.1*  HCT 24.9* 27.6* 22.8*  MCV 102.0* 104.9* 102.7*  PLT 603* 487* 381    Basic Metabolic Panel: Recent Labs  Lab 06/07/24 1848 06/08/24 0556 06/09/24 0511  NA 137 139 137  K 4.7 4.2 3.7  CL 105 105 105  CO2 19* 22 24  GLUCOSE 121* 111* 96  BUN 16 17 19   CREATININE 2.03* 2.11* 2.06*  CALCIUM  7.7* 7.8* 7.5*  MG  --  2.1  --   PHOS  --  3.3  --     GFR: Estimated Creatinine Clearance: 21.1 mL/min (A) (by C-G formula based on SCr of 2.06 mg/dL (H)).  Liver Function Tests: Recent Labs  Lab 06/07/24 1848 06/08/24 0556  AST 17 18  ALT <5 5  ALKPHOS 180* 194*  BILITOT 0.2 0.2  PROT 5.2* 5.6*  ALBUMIN 2.3* 2.4*     CBG: Recent Labs  Lab 06/07/24 1828  GLUCAP 106*   Radiology Studies: CT ABDOMEN PELVIS WO CONTRAST Result Date: 06/09/2024 EXAM: CT ABDOMEN AND PELVIS WITHOUT CONTRAST 06/08/2024 12:18:27 PM TECHNIQUE: CT of the abdomen and pelvis was performed without the administration of intravenous contrast. Multiplanar reformatted images are provided for review. Automated exposure control, iterative reconstruction, and/or weight-based adjustment of the mA/kV was utilized to reduce the radiation dose to as low as reasonably achievable. COMPARISON: 03/28/2024 CLINICAL HISTORY: Hydronephrosis. FINDINGS: LOWER CHEST: Small bilateral pleural effusions. The right pleural effusion is decreased in volume from the previous exam. LIVER: Mild steatosis of the liver. GALLBLADDER AND BILE DUCTS: Status post cholecystectomy. No biliary ductal dilatation. SPLEEN: The spleen is within normal limits in size and appearance. PANCREAS: Diffuse fatty infiltration of the pancreas without main duct dilatation, inflammation, or mass. ADRENAL GLANDS: Normal size and morphology bilaterally. No nodule, thickening, or hemorrhage. No periadrenal stranding. KIDNEYS, URETERS AND BLADDER: There is a right-sided percutaneous nephrostomy tube, which has been retracted with pigtail in the superficial subcutaneous soft tissues of the right posterior abdominal wall, image 34/2. Moderate right-sided hydronephrosis appears similar to the previous exam. There is right-sided perinephric fat stranding and right hydroureter, unchanged from previous study. With interval decompression of previous moderate hydronephrosis.  Thick-walled urinary bladder with surrounding haziness is again noted and appears similar to the previous study. Foley catheter has been removed. No stones in the kidneys or ureters. GI AND BOWEL: Stomach demonstrates no acute abnormality. No pathologic dilatation of the large or small bowel loops. Moderate retained stool noted within the  rectum. Left-sided colonic diverticulosis without signs of acute diverticulitis. Similar appearance of intramural fatty deposition and mild wall thickening involving the ascending and proximal transverse colon. PERITONEUM AND RETROPERITONEUM: No significant free fluid or fluid collections within the abdomen or pelvis. No pneumoperitoneum. VASCULATURE: Aorta is normal in caliber. LYMPH NODES: No lymphadenopathy. REPRODUCTIVE ORGANS: No acute abnormality. BONES AND SOFT TISSUES: The bones are diffusely osteopenic. Multilevel lumbar degenerative disc disease. Mild presacral soft tissue edema. No acute osseous abnormality. No focal soft tissue abnormality. IMPRESSION: 1. Moderate right-sided hydronephrosis with associated right hydroureter and perinephric fat stranding, similar to prior, with interval retraction of the right percutaneous nephrostomy tube and the pigtail loop in the superficial subcutaneous tissues of the right posterior abdominal wall, most consistent with nephrostomy tube dislodgement and resulting obstructive uropathy; recommend urology/interventional radiology evaluation for repositioning or exchange. 2. Left-sided percutaneous nephrostomy tube in place with decompression of previous left hydronephrosis. 3. Thick-walled urinary bladder with surrounding haziness, similar to prior, most consistent with cystitis; consider urinalysis and urine culture. Electronically signed by: Waddell Calk MD 06/09/2024 07:44 AM EST RP Workstation: [id number]   DG Chest Port 1 View Result Date: 06/07/2024 CLINICAL DATA:  Altered mental status. EXAM: PORTABLE CHEST 1 VIEW COMPARISON:  Chest radiograph dated 04/02/2024. FINDINGS: Left lung base atelectasis or scarring. Trace left pleural effusion may be present. The right lung is clear. No pneumothorax. Stable mild cardiomegaly. No acute osseous pathology. IMPRESSION: Left lung base atelectasis or scarring.  No interval change. Electronically Signed   By: Vanetta Chou M.D.   On: 06/07/2024 19:41    Scheduled Meds:  (feeding supplement) PROSource Plus  30 mL Oral BID BM   amLODipine   2.5 mg Oral Daily   cycloSPORINE   1 drop Both Eyes BID   estradiol   1 Applicatorful Vaginal QODAY   feeding supplement  1 Container Oral TID BM   heparin   5,000 Units Subcutaneous Q8H   Melatonin  10 mg Oral QHS   metoprolol  tartrate  12.5 mg Oral BID   multivitamin with minerals  1 tablet Oral Daily   pantoprazole   40 mg Oral Daily   Continuous Infusions:  lactated ringers      meropenem  (MERREM ) IV       LOS: 1 day    Time spent: 50 minutes   Eric Nunnery, MD [facility]   To contact the attending provider between 7A-7P or the covering provider during after hours 7P-7A, please log into the web site [email or web address] and access using universal [place] password for that web site. If you do not have the password, please call the hospital operator.  06/09/2024, 8:27 AM    "

## 2024-06-09 NOTE — Progress Notes (Addendum)
" ° °  Brief Progress Note   _____________________________________________________________________________________________________________  Patient Name: Robin Blackburn Patient DOB: Jan 04, 1946 Date: 06/09/2024      Data: Patient being transferred to [facility] for IR procedure. Orders are placed.    Action: Patient placement assigned bed 1407. Bed is currently dirty and bed assigned for cleaning.     Response:  Sent secure chat to [facility] LPN and Dr Ricky that patient has bed assigned at [facility].   ADDENDUM 1308: Updated Christina LPN via secure chat that bed board shows 1407 is Ready, provided number to [facility] and [facility]E. _____________________________________________________________________________________________________________  [facility] RN Expeditor Shama Monfils Please contact us  directly via secure chat (search for [facility]) or by calling us  at [phone] [facility]).  "

## 2024-06-09 NOTE — Progress Notes (Signed)
 Called by Dr. Ricky this morning for management of right hydronephrosis in setting of UTI.  Patient with WBC 19 yesterday and fevers.  Obstruction managed with b/l nephrostomy tubes and right side has become dislodged. Recommend transfer to [facility] for PCN exchange.  Discussed with IR.  Please contact IR as soon as patient arrives at [facility].

## 2024-06-09 NOTE — Procedures (Signed)
 Interventional Radiology Procedure Note  Procedure:  1) Ultrasound and fluoroscopic guided right percutaneous nephrostomy tube placement 2) Left percutaneous nephrostomy tube exchange  Findings: Please refer to procedural dictation for full description.  Indwelling right nephrostomy access unable to be salvaged.  New right 10 Fr percutaneous nephrostomy placed via same skin entry site into posterior inferior pole of right kidney.  Approximately 30 mL of purulent urine removed and sent for culture.  Routine left nephrostomy exchange.  Complications: None immediate  Estimated Blood Loss: < 5 mL  Recommendations: Follow right urine culture. Keep nephrostomies to bag drainage. IR will follow.   Ester Sides, MD

## 2024-06-09 NOTE — Sedation Documentation (Signed)
 RN Ellery Meroney pulled 4 mg Versed  50mg  benadryl  Ir room pysix. Pt. Received 3 mg Versed  and 50 mg benadryl  throughout the procedure. RN Warren Lindahl wasted 1 mg Versed  with Rosina Mose RN.

## 2024-06-10 DIAGNOSIS — N184 Chronic kidney disease, stage 4 (severe): Secondary | ICD-10-CM | POA: Insufficient documentation

## 2024-06-10 DIAGNOSIS — N390 Urinary tract infection, site not specified: Secondary | ICD-10-CM | POA: Diagnosis not present

## 2024-06-10 DIAGNOSIS — N179 Acute kidney failure, unspecified: Secondary | ICD-10-CM | POA: Diagnosis not present

## 2024-06-10 DIAGNOSIS — L8990 Pressure ulcer of unspecified site, unspecified stage: Secondary | ICD-10-CM | POA: Insufficient documentation

## 2024-06-10 DIAGNOSIS — R627 Adult failure to thrive: Principal | ICD-10-CM | POA: Insufficient documentation

## 2024-06-10 DIAGNOSIS — N139 Obstructive and reflux uropathy, unspecified: Secondary | ICD-10-CM | POA: Diagnosis not present

## 2024-06-10 DIAGNOSIS — D638 Anemia in other chronic diseases classified elsewhere: Secondary | ICD-10-CM | POA: Insufficient documentation

## 2024-06-10 LAB — CBC
HCT: 20.8 % — ABNORMAL LOW (ref 36.0–46.0)
Hemoglobin: 6.6 g/dL — CL (ref 12.0–15.0)
MCH: 31.3 pg (ref 26.0–34.0)
MCHC: 31.7 g/dL (ref 30.0–36.0)
MCV: 98.6 fL (ref 80.0–100.0)
Platelets: 362 K/uL (ref 150–400)
RBC: 2.11 MIL/uL — ABNORMAL LOW (ref 3.87–5.11)
RDW: 15.1 % (ref 11.5–15.5)
WBC: 8.3 K/uL (ref 4.0–10.5)
nRBC: 0 % (ref 0.0–0.2)

## 2024-06-10 LAB — PREPARE RBC (CROSSMATCH)

## 2024-06-10 MED ORDER — SODIUM CHLORIDE 0.9% IV SOLUTION: Freq: Once | INTRAVENOUS | Status: AC

## 2024-06-10 MED ORDER — GABAPENTIN 300 MG PO CAPS: 300.0000 mg | ORAL_CAPSULE | Freq: Three times a day (TID) | ORAL | Status: DC | PRN

## 2024-06-10 MED ORDER — LACTATED RINGERS IV SOLN: INTRAVENOUS | Status: DC

## 2024-06-10 MED ORDER — GABAPENTIN 100 MG PO CAPS
200.0000 mg | ORAL_CAPSULE | Freq: Three times a day (TID) | ORAL | Status: DC | PRN
  Administered 2024-06-10 – 2024-06-14 (×7): 200 mg via ORAL
  Filled 2024-06-10 (×7): qty 2

## 2024-06-10 NOTE — Progress Notes (Signed)
 IV team consulted for difficult PIV start. Patient's right arm swollen and painful. Left arm assessed with ultrasound. Able to place PIV in left brachial vein just below AC. If this PIV fails, need to consider PICC or midline due to poor vasculature.

## 2024-06-10 NOTE — Consult Note (Signed)
 "   I have been asked to see the patient by Dr. Toribio Door for evaluation and management of right hydronephrosis.  History of present illness:79 year old woman with a history of bladder outlet obstruction managed with bilateral nephrostomy tubes presented to Robin Blackburn, ED with decreased oral intake.  Imaging showed dislodged right sided nephrostomy tube resulting in right sided hydronephrosis.  She was transferred from [facility] to [facility] yesterday evening.  This was done in coordination with interventional radiology and the right sided nephrostomy tube was exchanged.   Review of systems: Patient sleeping. Patient Active Problem List   Diagnosis Date Noted   Failure to thrive in adult 06/08/2024   Bilateral pleural effusion 03/29/2024   Obstructive uropathy 03/29/2024   Macrocytic anemia 03/29/2024   Hypoalbuminemia due to protein-calorie malnutrition 03/29/2024   Acute kidney injury superimposed on stage 5 chronic kidney disease, not on chronic dialysis (HCC) 03/28/2024   Dysarthria 03/14/2024   Acute-on-chronic kidney injury 03/10/2024   Hyperkalemia 03/10/2024   Bladder outlet obstruction 03/10/2024   Pneumonia 02/18/2024   Urinary tract infection 01/04/2024   Pulmonary nodules 01/27/2023   Bilateral hearing loss 07/01/2022   Chronic ear pain, bilateral 07/01/2022   Spinal stenosis of lumbar region 11/22/2021   OAB (overactive bladder) 05/21/2020   Osteoarthritis of right knee 06/01/2019   Weakness    Metabolic acidosis    Recurrent UTI 11/25/2016   Benzodiazepine withdrawal with perceptual disturbance (HCC) 04/02/2016   Osteoporosis 11/01/2015   Vitamin D  deficiency 10/11/2015   History of fracture of left ankle 09/28/2015   Chronic pain in left foot 09/28/2015   Fibromyalgia syndrome 09/28/2015   GERD (gastroesophageal reflux disease) 04/05/2015   Diarrhea 01/05/2015   Chronic pain syndrome 12/02/2014   Back pain, lumbosacral 06/23/2014   Sinusitis, chronic  06/17/2014   H/O: CVA (cerebrovascular accident) 05/24/2014   HLD (hyperlipidemia) 05/24/2014   Physical deconditioning 05/24/2014   Right sided weakness 02/26/2014   Rheumatoid arthritis (HCC) 02/20/2014   Essential hypertension 02/19/2014   Neuropathy 02/19/2014   Anxiety 02/19/2014    Medications Ordered Prior to Encounter[1]  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   Depression    Fibromyalgia    Hypertension    Neuropathy    Rheumatoid arthritis (HCC)    Stroke (HCC) 02/17/14    Past Surgical History:  Procedure Laterality Date   ABDOMINAL HYSTERECTOMY     ANKLE RECONSTRUCTION     APPENDECTOMY     BACK SURGERY     CHOLECYSTECTOMY     IR NEPHROSTOMY PLACEMENT LEFT  04/22/2024   IR NEPHROSTOMY PLACEMENT RIGHT  04/01/2024   KNEE SURGERY      Social History[2]  Family History  Problem Relation Age of Onset   Breast cancer Mother    Hypertension Father    Transient ischemic attack Father    Lung cancer Father    Hypertension Brother    Leukemia Brother    Leukemia Brother    Leukemia Brother    CVA Maternal Grandfather     PE: Vitals:   06/09/24 1835 06/09/24 1840 06/09/24 2031 06/10/24 0045  BP: 123/72 130/69 130/81 (!) 118/54  Pulse: 93 94 97 75  Resp: 17 18 18 16   Temp:   98.5 F (36.9 C) 97.9 F (36.6 C)  TempSrc:   Oral Oral  SpO2: 96% 100% 96% 100%  Weight:  Height:       Patient appears to be in no acute distress  patient is alert and oriented x3 Atraumatic normocephalic head No cervical or supraclavicular lymphadenopathy appreciated No increased work of breathing, no audible wheezes/rhonchi Regular sinus rhythm/rate Abdomen is soft, nontender, nondistended, no CVA or suprapubic tenderness Lower extremities are symmetric without appreciable edema Grossly neurologically intact No identifiable skin lesions  Recent Labs    06/07/24 1848  06/08/24 0556 06/09/24 0511  WBC 18.5* 19.5* 11.7*  HGB 7.8* 8.5* 7.1*  HCT 24.9* 27.6* 22.8*   Recent Labs    06/07/24 1848 06/08/24 0556 06/09/24 0511  NA 137 139 137  K 4.7 4.2 3.7  CL 105 105 105  CO2 19* 22 24  GLUCOSE 121* 111* 96  BUN 16 17 19   CREATININE 2.03* 2.11* 2.06*  CALCIUM  7.7* 7.8* 7.5*   No results for input(s): LABPT, INR in the last 72 hours. No results for input(s): LABURIN in the last 72 hours. Results for orders placed or performed during the hospital encounter of 06/07/24  Aerobic/Anaerobic Culture w Gram Stain (surgical/deep wound)     Status: None (Preliminary result)   Collection Time: 06/09/24  6:47 PM   Specimen: Kidney; Urine  Result Value Ref Range Status   Specimen Description   Final    KIDNEY RIGHT Performed at [facility], [address] [place], [place] [address]    Special Requests   Final    NONE Performed at [facility], [address] [place], [place] [address]    Gram Stain   Final    RARE WBC PRESENT, PREDOMINANTLY PMN NO ORGANISMS SEEN Performed at [facility], [address] [place], [place] [address]    Culture PENDING  Incomplete   Report Status PENDING  Incomplete    Imaging: CT 06/08/24 IMPRESSION: 1. Moderate right-sided hydronephrosis with associated right hydroureter and perinephric fat stranding, similar to prior, with interval retraction of the right percutaneous nephrostomy tube and the pigtail loop in the superficial subcutaneous tissues of the right posterior abdominal wall, most consistent with nephrostomy tube dislodgement and resulting obstructive uropathy; recommend urology/interventional radiology evaluation for repositioning or exchange. 2. Left-sided percutaneous nephrostomy tube in place with decompression of previous left hydronephrosis. 3. Thick-walled urinary bladder with surrounding haziness, similar to prior, most consistent with  cystitis; consider urinalysis and urine culture.   Electronically signed by: Waddell Calk MD 06/09/2024 07:44 AM EST RP Workstation: [id number]  Assessment/Plan: 1.  Chronic bladder obstruction 2.  Dislodged right sided nephrostomy tube/hydronephrosis -I spoke with interventional radiology yesterday and patient was transferred to [facility] and had nephrostomy tube replaced - Recommend continue with bilateral nephrostomy tube exchanges - No urgent urologic intervention   Friend/POA Jefferel at beside [facility]        [1]  No current facility-administered medications on file prior to encounter.   Current Outpatient Medications on File Prior to Encounter  Medication Sig Dispense Refill   acetaminophen  (TYLENOL ) 500 MG tablet Take 1,000 mg by mouth 2 (two) times daily.     Adalimumab (HUMIRA, 2 PEN,) 40 MG/0.8ML PNKT Inject 0.8 mLs into the skin every 14 (fourteen) days.     ALPRAZolam  (XANAX ) 0.5 MG tablet Take 1 tablet (0.5 mg total) by mouth 2 (two) times daily as needed for anxiety. (Patient taking differently: Take 0.5 mg by mouth 2 (two) times daily.) 14 tablet 0   amLODipine  (NORVASC ) 2.5 MG tablet Take 2.5 mg by  mouth every morning. Hold for SBP<110     Cholecalciferol  5000 units TABS Take 1 tablet by mouth every morning.     Cranberry 450 MG TABS Take 1 tablet by mouth in the morning and at bedtime. For UTI     cycloSPORINE  (RESTASIS ) 0.05 % ophthalmic emulsion Place 1 drop into both eyes 2 (two) times daily. Dry eyes     estradiol  (ESTRACE ) 0.1 MG/GM vaginal cream Place 1 Applicatorful vaginally every other day. Discard plastic applicator. Insert a blueberry size amount (approximately 1 gram) of cream on fingertip inside vagina at bedtime every other night for long term use.     ferrous sulfate  325 (65 FE) MG EC tablet Take 1 tablet (325 mg total) by mouth daily with breakfast. 30 tablet 3   fluticasone (FLONASE) 50 MCG/ACT nasal spray Place 1 spray into both  nostrils 2 (two) times daily.     folic acid  (FOLVITE ) 1 MG tablet Take 1 mg by mouth every morning.     gabapentin  (NEURONTIN ) 300 MG capsule Take 1 capsule (300 mg total) by mouth 3 (three) times daily as needed (neuropathy pain symptoms). 15 capsule 0   ipratropium-albuterol  (DUONEB) 0.5-2.5 (3) MG/3ML SOLN Take 3 mLs by nebulization every 6 (six) hours as needed (shortness of breath, wheezing).     lansoprazole (PREVACID) 30 MG capsule Take 60 mg by mouth 2 (two) times daily before a meal. DO NOT CHANGE PER MD     leflunomide  (ARAVA ) 10 MG tablet Take 10 mg by mouth every morning.     Melatonin 10 MG TABS Take 10 mg by mouth at bedtime.     methenamine  (HIPREX ) 1 g tablet Take 1 tablet (1 g total) by mouth 2 (two) times daily. Prophylactic     metoprolol  tartrate (LOPRESSOR ) 25 MG tablet Take 0.5 tablets (12.5 mg total) by mouth 2 (two) times daily.     Multiple Vitamin (MULTIVITAMIN PO) Take 1 tablet by mouth 2 (two) times daily.     ondansetron  (ZOFRAN ) 4 MG tablet Take 4 mg by mouth every 6 (six) hours as needed for vomiting or nausea.     senna (SENOKOT) 8.6 MG tablet Take 1 tablet by mouth 2 (two) times daily.     sodium bicarbonate  650 MG tablet Take 650 mg by mouth 3 (three) times daily. 0900, 1400 and 2100     sodium chloride  0.9 % injection Inject 10 mLs into the vein every 12 (twelve) hours.     ticagrelor  (BRILINTA ) 60 MG TABS tablet Take 60 mg by mouth every morning.     traMADol  (ULTRAM ) 50 MG tablet Take 1 tablet (50 mg total) by mouth every 8 (eight) hours as needed for severe pain (pain score 7-10). (Patient taking differently: Take 100 mg by mouth every 6 (six) hours as needed for severe pain (pain score 7-10).) 15 tablet 0   traZODone  (DESYREL ) 100 MG tablet Take 100 mg by mouth at bedtime.     vitamin B-12 (CYANOCOBALAMIN ) 250 MCG tablet Take 250 mcg by mouth every morning.    [2]  Social History Tobacco Use   Smoking status: Never    Passive exposure: Never   Smokeless  tobacco: Never  Vaping Use   Vaping status: Never Used  Substance Use Topics   Alcohol  use: No   Drug use: No   "

## 2024-06-10 NOTE — Progress Notes (Addendum)
" °  Progress Note   Patient: Robin Blackburn FMW:[id number] DOB: [birth year] DOA: 06/07/2024     2 DOS: the patient was seen and examined on 06/10/2024   Brief hospital course: 79 year old woman PMH including chronic bladder outlet obstruction, obstructive uropathy status post bilateral nephrostomy tubes, CKD, presented with failure to thrive, anorexia; presented from [facility] for failure to thrive, anorexia.  Admitted for AKI superimposed on CKD, obstructive uropathy.  Found to have dislodged right nephrostomy.  Transferred to [facility] for urology evaluation, underwent IR procedure as below.  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  Urostomy tube culture with Klebsiella.  July 2025 had ESBL Klebsiella.  Continue meropenem .  AKI superimposed on  CKD stage IV with complicating features as above Baseline creatinine perhaps 1.5.  Admission creatinine 2.03 not far from November 19 but was significantly better in October. IV fluids.  Check BMP in AM.  Anemia of chronic disease with baseline perhaps around 8 Hemoglobin has drifted down, no evidence of bleeding.  Transfuse PRBCs.  Follow CBC.  Failure to thrive Nausea  Essential hypertension Metoprolol , amlodipine  on hold  PMH CVA CAD resume Brilinta  as soon  PMH rheumatoid arthritis Leflunomide  on hold     Subjective:  Feels okay  Physical Exam: Vitals:   06/10/24 1107 06/10/24 1427 06/10/24  1627 06/10/24 1642  BP: (!) 126/56 (!) 143/61 (!) 128/94 137/79  Pulse:  79 72 80  Resp:  16 18 18   Temp:  98.6 F (37 C) 97.7 F (36.5 C) 97.7 F (36.5 C)  TempSrc:  Oral Oral Oral  SpO2:  99%    Weight:      Height:       Physical Exam Vitals reviewed.  Constitutional:      General: She is not in acute distress.    Appearance: She is ill-appearing (Chronically). She is not toxic-appearing.  Cardiovascular:     Rate and Rhythm: Normal rate and regular rhythm.     Heart sounds: No murmur heard. Pulmonary:     Effort: Pulmonary effort is normal. No respiratory distress.     Breath sounds: No wheezing, rhonchi or rales.  Neurological:     Mental Status: She is alert.  Psychiatric:        Mood and Affect: Mood normal.        Behavior: Behavior normal.    Data Reviewed: Creatinine noted 2.06 yesterday.  BMP was hemolyzed, awaiting repeat specimen. Hemoglobin down to 6.6, transfuse 1 unit PRBC  Family Communication: HCPOA at bedside  Disposition: Status is: Inpatient Remains inpatient appropriate because: UTI, anemia     Time spent: 35 minutes  Author: Toribio Door, MD 06/10/2024 6:51 PM  For on call review [email or web address]    "

## 2024-06-10 NOTE — Hospital Course (Addendum)
 79 year old woman PMH including chronic bladder outlet obstruction, obstructive uropathy status post bilateral nephrostomy tubes, CKD, presented with failure to thrive, anorexia; presented from [facility] for failure to thrive, anorexia.  Admitted for AKI superimposed on CKD, obstructive uropathy.  Found to have dislodged right nephrostomy.  Transferred to [facility] for urology evaluation, underwent IR procedure as below.  Consultants IR Urology   Procedures/Events 12/30 admit to AP 1/1 transfer to [facility] for IR eval of dislodged nephrostomy tube; Ultrasound and fluoroscopic guided right percutaneous nephrostomy tube placement; Left percutaneous nephrostomy tube exchange

## 2024-06-10 NOTE — TOC Progression Note (Addendum)
 Transition of Care [facility]) - Progression Note    Patient Details  Name: Robin Blackburn MRN: [id number] Date of Birth: [birth year]  [facility] CM/SW Contact  Irby Fails, Nathanel, RN Phone Number: 06/10/2024, 5:01 PM  Clinical Narrative:   Noted patient from [facility], & wants a different facility-provided w/bed offers await choice. Service Provider Request Status Stars Address Phone  [facility]  Accepted 1 [address], [place] [address] [phone]  [facility]  Accepted 2 [address] [place], [place] [address] [phone]      Expected Discharge Plan: Skilled Nursing Facility Barriers to Discharge: Continued Medical Work up               Expected Discharge Plan and Services In-house Referral: Clinical Social Work Discharge Planning Services: CM Consult Post Acute Care Choice: Nursing Home Living arrangements for the past 2 months: Skilled Nursing Facility                                       Social Drivers of Health (SDOH) Interventions SDOH Screenings   Food Insecurity: No Food Insecurity (06/08/2024)  Housing: Low Risk (06/08/2024)  Transportation Needs: No Transportation Needs (06/08/2024)  Utilities: Not At Risk (06/08/2024)  Alcohol  Screen: Low Risk (01/06/2022)  Depression (PHQ2-9): Low Risk (02/25/2024)  Financial Resource Strain: Low Risk (01/06/2022)  Physical Activity: Inactive (11/07/2022)   Received from [facility]  Social Connections: Unknown (06/08/2024)  Recent Concern: Social Connections - Socially Isolated (03/29/2024)  Stress: No Stress Concern Present (01/06/2022)  Tobacco Use: Low Risk (06/07/2024)    Readmission Risk Interventions    03/29/2024    9:23 AM 03/11/2024    8:52 AM 01/09/2024   11:45 AM  Readmission Risk Prevention Plan  Transportation Screening Complete Complete Complete  Home Care Screening   Complete  Medication Review (RN CM)    Complete  Medication Review Oceanographer) Complete Complete   HRI or Home Care Consult Complete Complete   SW Recovery Care/Counseling Consult Complete Complete   Palliative Care Screening Not Applicable Not Applicable   Skilled Nursing Facility Complete Complete

## 2024-06-11 DIAGNOSIS — R627 Adult failure to thrive: Secondary | ICD-10-CM | POA: Diagnosis not present

## 2024-06-11 DIAGNOSIS — N179 Acute kidney failure, unspecified: Secondary | ICD-10-CM | POA: Diagnosis not present

## 2024-06-11 DIAGNOSIS — N390 Urinary tract infection, site not specified: Secondary | ICD-10-CM | POA: Diagnosis not present

## 2024-06-11 LAB — BASIC METABOLIC PANEL WITH GFR
Anion gap: 9 (ref 5–15)
BUN: 13 mg/dL (ref 8–23)
CO2: 20 mmol/L — ABNORMAL LOW (ref 22–32)
Calcium: 7.4 mg/dL — ABNORMAL LOW (ref 8.9–10.3)
Chloride: 104 mmol/L (ref 98–111)
Creatinine, Ser: 1.82 mg/dL — ABNORMAL HIGH (ref 0.44–1.00)
GFR, Estimated: 28 mL/min — ABNORMAL LOW
Glucose, Bld: 82 mg/dL (ref 70–99)
Potassium: 3.7 mmol/L (ref 3.5–5.1)
Sodium: 133 mmol/L — ABNORMAL LOW (ref 135–145)

## 2024-06-11 LAB — CBC
HCT: 28.1 % — ABNORMAL LOW (ref 36.0–46.0)
Hemoglobin: 9.1 g/dL — ABNORMAL LOW (ref 12.0–15.0)
MCH: 31.1 pg (ref 26.0–34.0)
MCHC: 32.4 g/dL (ref 30.0–36.0)
MCV: 95.9 fL (ref 80.0–100.0)
Platelets: 388 K/uL (ref 150–400)
RBC: 2.93 MIL/uL — ABNORMAL LOW (ref 3.87–5.11)
RDW: 15.9 % — ABNORMAL HIGH (ref 11.5–15.5)
WBC: 8 K/uL (ref 4.0–10.5)
nRBC: 0 % (ref 0.0–0.2)

## 2024-06-11 MED ORDER — AMLODIPINE BESYLATE 5 MG PO TABS
2.5000 mg | ORAL_TABLET | Freq: Every morning | ORAL | Status: DC
  Administered 2024-06-12 – 2024-06-14 (×3): 2.5 mg via ORAL
  Filled 2024-06-11 (×3): qty 1

## 2024-06-11 NOTE — Progress Notes (Signed)
" °  Progress Note   Patient: Robin Blackburn FMW:[id number] DOB: 1946/04/11 DOA: 06/07/2024     3 DOS: the patient was seen and examined on 06/11/2024   Brief hospital course: 79 year old woman PMH including chronic bladder outlet obstruction, obstructive uropathy status post bilateral nephrostomy tubes, CKD, presented with failure to thrive, anorexia; presented from [facility] for failure to thrive, anorexia.  Admitted for AKI superimposed on CKD, obstructive uropathy.  Found to have dislodged right nephrostomy.  Transferred to [facility] for urology evaluation, underwent IR procedure as below.  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  Urostomy tube culture with Klebsiella.  July 2025 had ESBL Klebsiella.  Continue meropenem  pending sensitivity.   AKI superimposed on  CKD stage IV with complicating features as above Baseline creatinine perhaps 1.5.  Admission creatinine 2.03 not far from November 19 but was significantly better in October. Creatinine better today at 1.82.  Continue fluids and repeat BMP in AM.   Anemia of chronic disease with baseline perhaps around 8 Hemoglobin up appropriately status post PRBC.  Check CBC in AM.   Failure to thrive Nausea   Essential hypertension Resume amlodipine    PMH CVA CAD resume Brilinta  as soon   PMH rheumatoid arthritis Leflunomide  on hold      Subjective:  Tolerating some food.  Physical  Exam: Vitals:   06/10/24 1956 06/11/24 0519 06/11/24 1209 06/11/24 1418  BP: (!) 117/53 (!) 143/65 (!) 159/80 (!) 160/86  Pulse: 82 73 82 80  Resp: 18 17 16 16   Temp: 98.9 F (37.2 C) 98.8 F (37.1 C) 98.1 F (36.7 C) (!) 97.3 F (36.3 C)  TempSrc: Oral Oral Oral Oral  SpO2: 95% 99% 100% 100%  Weight:      Height:       Physical Exam Vitals reviewed.  Constitutional:      General: She is not in acute distress.    Appearance: She is ill-appearing (Chronically). She is not toxic-appearing.  Cardiovascular:     Rate and Rhythm: Normal rate and regular rhythm.     Heart sounds: No murmur heard. Pulmonary:     Effort: Pulmonary effort is normal. No respiratory distress.     Breath sounds: No wheezing, rhonchi or rales.  Neurological:     Mental Status: She is alert.  Psychiatric:        Mood and Affect: Mood normal.        Behavior: Behavior normal.     Data Reviewed: UOP incompletely recorded Creatinine down to 1.82 Hemoglobin up to 9.1 status posttransfusion 1 unit PRBC  Family Communication: Healthcare POA at bedside  Disposition: Status is: Inpatient Remains inpatient appropriate because: AKI, anemia     Time spent: 20 minutes  Author: Toribio Door, MD 06/11/2024 3:36 PM  For on call review [email or web address]    "

## 2024-06-12 DIAGNOSIS — N184 Chronic kidney disease, stage 4 (severe): Secondary | ICD-10-CM | POA: Diagnosis not present

## 2024-06-12 DIAGNOSIS — N179 Acute kidney failure, unspecified: Secondary | ICD-10-CM | POA: Diagnosis not present

## 2024-06-12 DIAGNOSIS — N390 Urinary tract infection, site not specified: Secondary | ICD-10-CM | POA: Diagnosis not present

## 2024-06-12 DIAGNOSIS — N139 Obstructive and reflux uropathy, unspecified: Secondary | ICD-10-CM | POA: Diagnosis not present

## 2024-06-12 LAB — BASIC METABOLIC PANEL WITH GFR
Anion gap: 6 (ref 5–15)
BUN: 12 mg/dL (ref 8–23)
CO2: 22 mmol/L (ref 22–32)
Calcium: 7.4 mg/dL — ABNORMAL LOW (ref 8.9–10.3)
Chloride: 106 mmol/L (ref 98–111)
Creatinine, Ser: 1.64 mg/dL — ABNORMAL HIGH (ref 0.44–1.00)
GFR, Estimated: 32 mL/min — ABNORMAL LOW
Glucose, Bld: 84 mg/dL (ref 70–99)
Potassium: 3.4 mmol/L — ABNORMAL LOW (ref 3.5–5.1)
Sodium: 134 mmol/L — ABNORMAL LOW (ref 135–145)

## 2024-06-12 MED ORDER — CALCIUM CARBONATE ANTACID 500 MG PO CHEW
800.0000 mg | CHEWABLE_TABLET | Freq: Two times a day (BID) | ORAL | Status: DC
  Administered 2024-06-12 – 2024-06-13 (×3): 800 mg via ORAL
  Filled 2024-06-12 (×5): qty 4

## 2024-06-12 NOTE — Progress Notes (Signed)
" °  Progress Note   Patient: Robin Blackburn FMW:[id number] DOB: 1945/08/13 DOA: 06/07/2024     4 DOS: the patient was seen and examined on 06/12/2024   Brief hospital course: 79 year old woman PMH including chronic bladder outlet obstruction, obstructive uropathy status post bilateral nephrostomy tubes, CKD, presented with failure to thrive, anorexia; presented from [facility] for failure to thrive, anorexia.  Admitted for AKI superimposed on CKD, obstructive uropathy.  Found to have dislodged right nephrostomy.  Transferred to [facility] for urology evaluation, underwent IR procedure as below.  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  Urostomy tube culture with Klebsiella.  Can narrow antibiotics based on sensitivity.   AKI superimposed on  CKD stage IV with complicating features as above Baseline creatinine perhaps 1.5.  Admission creatinine 2.03 not far from November 19 but was significantly better in October. Creatinine 1.64 today, nearing baseline.   Anemia of chronic disease with baseline perhaps around 8 Hemoglobin up appropriately status post PRBC.  Check CBC in AM.   Failure to thrive Nausea   Essential hypertension Stable.  Continue amlodipine    PMH CVA Resume Brilinta  tomorrow if hemoglobin stable.   PMH rheumatoid arthritis Leflunomide  on hold  Overall improving.  Likely return to SNF next 1 to 2 days.    Subjective:  Feels okay  today  Physical Exam: Vitals:   06/11/24 1209 06/11/24 1418 06/11/24 2028 06/12/24 0525  BP: (!) 159/80 (!) 160/86 (!) 168/73 (!) 142/63  Pulse: 82 80 79 (!) 59  Resp: 16 16 16 12   Temp: 98.1 F (36.7 C) (!) 97.3 F (36.3 C) 97.6 F (36.4 C) 97.8 F (36.6 C)  TempSrc: Oral Oral Oral   SpO2: 100% 100% 94% 99%  Weight:      Height:       Physical Exam Vitals reviewed.  Constitutional:      General: She is not in acute distress.    Appearance: She is not ill-appearing or toxic-appearing.  Cardiovascular:     Rate and Rhythm: Normal rate and regular rhythm.     Heart sounds: No murmur heard. Pulmonary:     Effort: Pulmonary effort is normal. No respiratory distress.     Breath sounds: No wheezing, rhonchi or rales.  Neurological:     Mental Status: She is alert.  Psychiatric:        Mood and Affect: Mood normal.        Behavior: Behavior normal.     Data Reviewed: Potassium 3.4, creatinine down to 1.64, calcium  7.4 uncorrected Urine culture Klebsiella sensitive to cefepime, ceftriaxone , Cipro , Merrem , Bactrim   Family Communication: none present  Disposition: Status is: Inpatient Remains inpatient appropriate because: UTI, plan return to SNF     Time spent: 20 minutes  Author: Toribio Door, MD 06/12/2024 8:08 AM  For on call review [email or web address]    "

## 2024-06-13 DIAGNOSIS — N179 Acute kidney failure, unspecified: Secondary | ICD-10-CM | POA: Diagnosis not present

## 2024-06-13 DIAGNOSIS — N390 Urinary tract infection, site not specified: Secondary | ICD-10-CM | POA: Diagnosis not present

## 2024-06-13 DIAGNOSIS — N184 Chronic kidney disease, stage 4 (severe): Secondary | ICD-10-CM | POA: Diagnosis not present

## 2024-06-13 DIAGNOSIS — D638 Anemia in other chronic diseases classified elsewhere: Secondary | ICD-10-CM | POA: Diagnosis not present

## 2024-06-13 LAB — BASIC METABOLIC PANEL WITH GFR
Anion gap: 9 (ref 5–15)
BUN: 10 mg/dL (ref 8–23)
CO2: 21 mmol/L — ABNORMAL LOW (ref 22–32)
Calcium: 7.3 mg/dL — ABNORMAL LOW (ref 8.9–10.3)
Chloride: 103 mmol/L (ref 98–111)
Creatinine, Ser: 1.64 mg/dL — ABNORMAL HIGH (ref 0.44–1.00)
GFR, Estimated: 32 mL/min — ABNORMAL LOW
Glucose, Bld: 87 mg/dL (ref 70–99)
Potassium: 3.3 mmol/L — ABNORMAL LOW (ref 3.5–5.1)
Sodium: 133 mmol/L — ABNORMAL LOW (ref 135–145)

## 2024-06-13 LAB — CBC
HCT: 30.2 % — ABNORMAL LOW (ref 36.0–46.0)
Hemoglobin: 10 g/dL — ABNORMAL LOW (ref 12.0–15.0)
MCH: 31.3 pg (ref 26.0–34.0)
MCHC: 33.1 g/dL (ref 30.0–36.0)
MCV: 94.4 fL (ref 80.0–100.0)
Platelets: 468 K/uL — ABNORMAL HIGH (ref 150–400)
RBC: 3.2 MIL/uL — ABNORMAL LOW (ref 3.87–5.11)
RDW: 15.9 % — ABNORMAL HIGH (ref 11.5–15.5)
WBC: 7.6 K/uL (ref 4.0–10.5)
nRBC: 0.3 % — ABNORMAL HIGH (ref 0.0–0.2)

## 2024-06-13 LAB — BPAM RBC
Blood Product Expiration Date: 202601312359
ISSUE DATE / TIME: 202601021600
Unit Type and Rh: 5100

## 2024-06-13 LAB — TYPE AND SCREEN
ABO/RH(D): O POS
Antibody Screen: NEGATIVE
Unit division: 0

## 2024-06-13 MED ORDER — SODIUM CHLORIDE 0.9 % IV SOLN
2.0000 g | Freq: Once | INTRAVENOUS | Status: AC
  Administered 2024-06-13: 2 g via INTRAVENOUS
  Filled 2024-06-13: qty 20

## 2024-06-13 MED ORDER — TRAMADOL HCL 50 MG PO TABS
100.0000 mg | ORAL_TABLET | Freq: Two times a day (BID) | ORAL | Status: DC | PRN
  Administered 2024-06-13: 100 mg via ORAL
  Filled 2024-06-13: qty 2

## 2024-06-13 MED ORDER — PROCHLORPERAZINE EDISYLATE 10 MG/2ML IJ SOLN
10.0000 mg | Freq: Four times a day (QID) | INTRAMUSCULAR | Status: DC | PRN
  Administered 2024-06-13: 10 mg via INTRAVENOUS
  Filled 2024-06-13: qty 2

## 2024-06-13 MED ORDER — TICAGRELOR 60 MG PO TABS
60.0000 mg | ORAL_TABLET | Freq: Every morning | ORAL | Status: DC
  Administered 2024-06-13 – 2024-06-14 (×2): 60 mg via ORAL
  Filled 2024-06-13 (×2): qty 1

## 2024-06-13 NOTE — TOC Progression Note (Addendum)
 Transition of Care [facility]) - Progression Note    Patient Details  Name: Robin Blackburn MRN: [id number] Date of Birth: 25-Feb-1946  [facility]) CM/SW Contact  Rayona Sardinha, Nathanel, RN Phone Number: 06/13/2024, 3:54 PM  Clinical Narrative:  Beatris to HCPOA(Janell) about LTC choice-return back to [facility] LTC-asking about [facility]iative care services.MD notified.  -4:30p-Hospice referral @ [facility] received for [facility]-left vm w/[facility] await call back if able to accept.    Expected Discharge Plan: Long Term Nursing Home Barriers to Discharge: Continued Medical Work up               Expected Discharge Plan and Services In-house Referral: Clinical Social Work Discharge Planning Services: CM Consult Post Acute Care Choice: Nursing Home Living arrangements for the past 2 months: Skilled Nursing Facility                                       Social Drivers of Health (SDOH) Interventions SDOH Screenings   Food Insecurity: No Food Insecurity (06/08/2024)  Housing: Low Risk (06/08/2024)  Transportation Needs: No Transportation Needs (06/08/2024)  Utilities: Not At Risk (06/08/2024)  Alcohol  Screen: Low Risk (01/06/2022)  Depression (PHQ2-9): Low Risk (02/25/2024)  Financial Resource Strain: Low Risk (01/06/2022)  Physical Activity: Inactive (11/07/2022)   Received from [facility]  Social Connections: Unknown (06/08/2024)  Recent Concern: Social Connections - Socially Isolated (03/29/2024)  Stress: No Stress Concern Present (01/06/2022)  Tobacco Use: Low Risk (06/07/2024)    Readmission Risk Interventions    03/29/2024    9:23 AM 03/11/2024    8:52 AM 01/09/2024   11:45 AM  Readmission Risk Prevention Plan  Transportation Screening Complete Complete Complete  Home Care Screening   Complete  Medication Review (RN CM)   Complete  Medication Review Oceanographer) Complete Complete   HRI or Home Care Consult Complete Complete   SW  Recovery Care/Counseling Consult Complete Complete   Palliative Care Screening Not Applicable Not Applicable   Skilled Nursing Facility Complete Complete

## 2024-06-13 NOTE — Progress Notes (Signed)
" °  Progress Note   Patient: Robin Blackburn FMW:[id number] DOB: 04-23-1946 DOA: 06/07/2024     5 DOS: the patient was seen and examined on 06/13/2024   Brief hospital course: 79 year old woman PMH including chronic bladder outlet obstruction, obstructive uropathy status post bilateral nephrostomy tubes, CKD, presented with failure to thrive, anorexia; presented from [facility] for failure to thrive, anorexia.  Admitted for AKI superimposed on CKD, obstructive uropathy.  Found to have dislodged right nephrostomy.  Transferred to [facility] for [facility]rology evaluation, underwent IR procedure as below.  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  Urostomy tube culture with Klebsiella.  Can narrow antibiotics based on sensitivity.   AKI superimposed on  CKD stage IV with complicating features as above Baseline creatinine perhaps 1.5.  Admission creatinine 2.03 not far from November 19 but was significantly better in October. Creatinine 1.64 today, stable.   Anemia of chronic disease with baseline perhaps around 8 Hemoglobin up appropriately status post PRBC.  Check CBC in AM.   Failure to thrive Nausea   Essential hypertension Stable.  Continue amlodipine    PMH CVA Resume Brilinta    PMH rheumatoid arthritis Leflunomide  on hold      Subjective:  Does not feel very good today, generalized pain  Physical Exam: Vitals:   06/12/24 2017 06/13/24 0438 06/13/24 0644  06/13/24 1357  BP: 128/77 (!) 154/61 132/60 126/65  Pulse: 86 77 76 84  Resp: 18 16  16   Temp: 99.3 F (37.4 C) 98.2 F (36.8 C)  98.7 F (37.1 C)  TempSrc: Oral   Oral  SpO2: 100% 98%  100%  Weight:      Height:       Physical Exam Vitals reviewed.  Constitutional:      General: She is not in acute distress.    Appearance: She is not ill-appearing (Chronically) or toxic-appearing.  Cardiovascular:     Rate and Rhythm: Normal rate and regular rhythm.     Heart sounds: No murmur heard. Pulmonary:     Effort: Pulmonary effort is normal. No respiratory distress.     Breath sounds: No wheezing, rhonchi or rales.  Neurological:     Mental Status: She is alert.  Psychiatric:        Mood and Affect: Mood normal.        Behavior: Behavior normal.     Data Reviewed: Potassium 3.3, creatinine no change today 1.64  Family Communication: Healthcare power of attorney at bedside  Disposition: Status is: Inpatient Remains inpatient appropriate because: UTI     Time spent: 20 minutes  Author: Toribio Door, MD 06/13/2024 5:34 PM  For on call review [email or web address]    "

## 2024-06-13 NOTE — Progress Notes (Signed)
" ° °  °  Subjective: First time meeting Robin Blackburn.  She was resting comfortably in bed on my arrival and was accompanied by her long-term caretaker.  Case and plan was reviewed with both parties and all questions were answered to their satisfaction.  Objective: Vital signs in last 24 hours: Temp:  [98.2 F (36.8 C)-99.3 F (37.4 C)] 98.2 F (36.8 C) (01/05 0438) Pulse Rate:  [76-101] 76 (01/05 0644) Resp:  [14-18] 16 (01/05 0438) BP: (110-154)/(60-77) 132/60 (01/05 0644) SpO2:  [98 %-100 %] 98 % (01/05 0438)  Assessment/Plan: # Bladder outlet obstruction # Bilateral nephrostomy tubes # AoCKD # ESBL UTI  Dislodged right nephrostomy tube on CT imaging.  Replaced by IR on 06/10/2023.  Exchange with IR as scheduled. Trend labs.  Slow but progressive improvement in serum creatinine.  Approaching baseline. ESBL Klebsiella.  Continues meropenem .  Sensitivities pending. Leukocytosis has resolved and patient is normothermic. Okay to discharge once medically clear.  Caretaker reports that she plans to have her follow-up with her regular urologist but could not recall the name.  Our practice was offered, should they need an appointment.  Urology will sign off at this time.  Please feel free to call with questions or concerns.  Intake/Output from previous day: 01/04 0701 - 01/05 0700 In: 2082.2 [P.O.:120; I.V.:1642.2; IV Piggyback:300] Out: 1050 [Urine:1050]  Intake/Output this shift: No intake/output data recorded.  Physical Exam:  General: Alert and oriented CV: No cyanosis Lungs: equal chest rise Gu: Lateral PCN C in place draining clear yellow urine  Lab Results: Recent Labs    06/10/24 0949 06/11/24 0640 06/13/24 0506  HGB 6.6* 9.1* 10.0*  HCT 20.8* 28.1* 30.2*   BMET Recent Labs    06/11/24 0640 06/12/24 0539 06/13/24 0506  NA 133* 134* 133*  K 3.7 3.4* 3.3*  CL 104 106 103  CO2 20* 22 21*  GLUCOSE 82 84 87  BUN 13 12 10   CREATININE 1.82* 1.64* 1.64*  CALCIUM  7.4*  7.4* 7.3*  HGB 9.1*  --  10.0*  WBC 8.0  --  7.6     Studies/Results: No results found.    LOS: 5 days   Ole Bourdon, NP [facility] Pager: [phone]  06/13/2024, 9:37 AM  "

## 2024-06-14 LAB — CBC
HCT: 30.3 % — ABNORMAL LOW (ref 36.0–46.0)
Hemoglobin: 10 g/dL — ABNORMAL LOW (ref 12.0–15.0)
MCH: 30.9 pg (ref 26.0–34.0)
MCHC: 33 g/dL (ref 30.0–36.0)
MCV: 93.5 fL (ref 80.0–100.0)
Platelets: 513 K/uL — ABNORMAL HIGH (ref 150–400)
RBC: 3.24 MIL/uL — ABNORMAL LOW (ref 3.87–5.11)
RDW: 15.7 % — ABNORMAL HIGH (ref 11.5–15.5)
WBC: 11 K/uL — ABNORMAL HIGH (ref 4.0–10.5)
nRBC: 0 % (ref 0.0–0.2)

## 2024-06-14 LAB — BASIC METABOLIC PANEL WITH GFR
Anion gap: 8 (ref 5–15)
BUN: 11 mg/dL (ref 8–23)
CO2: 20 mmol/L — ABNORMAL LOW (ref 22–32)
Calcium: 7.1 mg/dL — ABNORMAL LOW (ref 8.9–10.3)
Chloride: 106 mmol/L (ref 98–111)
Creatinine, Ser: 1.53 mg/dL — ABNORMAL HIGH (ref 0.44–1.00)
GFR, Estimated: 34 mL/min — ABNORMAL LOW
Glucose, Bld: 87 mg/dL (ref 70–99)
Potassium: 3.2 mmol/L — ABNORMAL LOW (ref 3.5–5.1)
Sodium: 134 mmol/L — ABNORMAL LOW (ref 135–145)

## 2024-06-14 LAB — AEROBIC/ANAEROBIC CULTURE W GRAM STAIN (SURGICAL/DEEP WOUND)

## 2024-06-14 MED ORDER — ALPRAZOLAM 0.5 MG PO TABS: 0.5000 mg | ORAL_TABLET | Freq: Two times a day (BID) | ORAL | 0 refills | Status: DC | PRN

## 2024-06-14 MED ORDER — POTASSIUM CHLORIDE CRYS ER 20 MEQ PO TBCR
40.0000 meq | EXTENDED_RELEASE_TABLET | Freq: Once | ORAL | Status: AC
  Administered 2024-06-14: 40 meq via ORAL
  Filled 2024-06-14: qty 2

## 2024-06-14 MED ORDER — GABAPENTIN 100 MG PO CAPS: 200.0000 mg | ORAL_CAPSULE | Freq: Three times a day (TID) | ORAL | Status: AC | PRN

## 2024-06-14 MED ORDER — TRAMADOL HCL 50 MG PO TABS: 100.0000 mg | ORAL_TABLET | Freq: Four times a day (QID) | ORAL | 0 refills | Status: AC | PRN

## 2024-06-14 MED ORDER — CALCIUM GLUCONATE-NACL 1-0.675 GM/50ML-% IV SOLN
1.0000 g | Freq: Once | INTRAVENOUS | Status: DC
  Filled 2024-06-14: qty 50

## 2024-06-14 MED ORDER — PROSOURCE PLUS PO LIQD: 30.0000 mL | Freq: Two times a day (BID) | ORAL | Status: AC

## 2024-06-14 NOTE — TOC Transition Note (Addendum)
 Transition of Care [facility]) - Discharge Note   Patient Details  Name: Robin Blackburn MRN: [id number] Date of Birth: 03/24/1946  Transition of Care [facility]) CM/SW Contact:  Bascom Service, RN Phone Number: 06/14/2024, 12:08 PM   Clinical Narrative: d/c back to [facility] LTC w/palliative care [facility] rep Melisa, & [facility] accepted back-patient/HCPOA agree to return. Await d/c summary prior rm#,report#,then PTAR.DNR.  -1:30p going to [facility] LTC w/palliative care rm#[id number];report#[phone]. PTAR called. No further CM needs.     Final next level of care: Long Term Nursing Home Barriers to Discharge: No Barriers Identified   Patient Goals and CMS Choice Patient states their goals for this hospitalization and ongoing recovery are:: Return back to [facility] LTC CMS Medicare.gov Compare Post Acute Care list provided to:: Patient Represenative (must comment) (HCPOA(Janell)) Choice offered to / list presented to : [facility] POA / Guardian [place] ownership interest in [facility].provided to:: [facility] POA / Guardian    Discharge Placement                       Discharge Plan and Services Additional resources added to the After Visit Summary for   In-house Referral: Clinical Social Work Discharge Planning Services: CM Consult Post Acute Care Choice: Nursing Home                               Social Drivers of Health (SDOH) Interventions SDOH Screenings   Food Insecurity: No Food Insecurity (06/08/2024)  Housing: Low Risk (06/08/2024)  Transportation Needs: No Transportation Needs (06/08/2024)  Utilities: Not At Risk (06/08/2024)  Alcohol  Screen: Low Risk (01/06/2022)  Depression (PHQ2-9): Low Risk (02/25/2024)  Financial Resource Strain: Low Risk (01/06/2022)  Physical Activity: Inactive (11/07/2022)   Received from [facility]  Social Connections: Unknown (06/08/2024)  Recent Concern: Social Connections - Socially Isolated (03/29/2024)   Stress: No Stress Concern Present (01/06/2022)  Tobacco Use: Low Risk (06/07/2024)     Readmission Risk Interventions    03/29/2024    9:23 AM 03/11/2024    8:52 AM 01/09/2024   11:45 AM  Readmission Risk Prevention Plan  Transportation Screening Complete Complete Complete  Home Care Screening   Complete  Medication Review (RN CM)   Complete  Medication Review Oceanographer) Complete Complete   HRI or Home Care Consult Complete Complete   SW Recovery Care/Counseling Consult Complete Complete   Palliative Care Screening Not Applicable Not Applicable   Skilled Nursing Facility Complete Complete

## 2024-06-14 NOTE — Discharge Summary (Signed)
 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  Admitted for AKI superimposed on CKD, obstructive uropathy.  Found to have dislodged right nephrostomy.  Transferred to [facility] for [facility]rology evaluation, underwent IR procedure as below.  Treated with empiric antibiotics with clinical improvement.  Renal function improving.  Hospitalization uncomplicated.  Consultants IR Urology   Procedures/Events 12/30 admit to [facility] 1/1 transfer to [facility] for IR eval of dislodged nephrostomy tube; Ultrasound and fluoroscopic guided right percutaneous nephrostomy tube placement; Left percutaneous nephrostomy tube exchange  Chronic obstructive uropathy Status post bilateral nephrostomy tubes Complicated UTI due to nephrostomy tube  CT showed moderate right sided hydronephrosis with dislodgment of right nephrostomy tube, transferred to [facility] for urology consultation and IR intervention Urinalysis abnormal.  Urostomy tube culture with Klebsiella. Completed antibiotics Follow-up with  urology as an outpatient   AKI superimposed on  CKD stage IV with complicating features as above Baseline creatinine perhaps 1.5.  Admission creatinine 2.03 not far from November 19 but was significantly better in October. Creatinine down to 1.53, expect continued improvement.  Follow-up BMP in 1 week.   Anemia of chronic disease with baseline perhaps around 8 Hemoglobin up appropriately status post PRBC and stable.   Failure to thrive Nausea   Essential hypertension Stable.  Continue amlodipine    PMH CVA Continue Brilinta    PMH rheumatoid arthritis Leflunomide  on hold      Pain control -   Controlled Substance Reporting System database was reviewed.   Disposition: Skilled nursing facility Diet recommendation:  Diet Orders (From admission, onward)     Start     Ordered   06/10/24 1056  Diet regular Fluid consistency: Thin  Diet effective now       Question:  Fluid consistency:  Answer:  Thin   06/10/24 1055            DISCHARGE MEDICATION: Allergies as of 06/14/2024       Reactions   Cortisone Anaphylaxis   Cardiovascular Arrest   Fentanyl  Anaphylaxis, Hives, Other (See Comments)   felt like I had demons in my head   Hydromorphone  Anaphylaxis, Nausea And Vomiting   GI Intolerance   Iodinated Contrast Media Anaphylaxis, Hives, Rash, Dermatitis   Respiratory Distress Iodinated contrast media (substance)   Keflex  [cephalexin ] Nausea And Vomiting   Meperidine Anaphylaxis, Shortness Of Breath   Respiratory Distress   Morphine Anaphylaxis, Nausea And Vomiting, Swelling, Rash   GI Intolerance   Oxycodone -acetaminophen  Anaphylaxis, Nausea And Vomiting, Swelling, Dermatitis, Rash   GI Intolerance, Mouth swelling. acetaminophen  / oxycodone    Penicillins Anaphylaxis, Nausea And Vomiting, Other (See Comments)   Immediate rash, facial/tongue/throat swelling, SOB or lightheadedness with hypotension  Product containing penicillin (product)   Afluria  Preservative Free [influenza Virus Vacc Split Pf] Other (See Comments)   Unknown   Aspirin  Other (See Comments)   GI Intolerance   Codeine Other (See Comments)   Unknown    Influenza Virus Vaccine Other (See Comments)   Unknown    Iohexol  Other (See Comments)   Unknown reaction   Meperidine Hcl Other (See Comments)   Unknown   Oxycodone  Hcl Other (See Comments)   Unknown   Shellfish Allergy Other (See Comments)   Glucosamine not an option Unknown    Sulfa  Antibiotics Nausea And Vomiting   Tolerates Bactrim  though   Troleandomycin Nausea And Vomiting, Swelling   GI Intolerance   Potassium Chloride  Other (See Comments)   No reaction listed on MAR   Bisphosphonates Hives, Other (See Comments)   GI intolerance   Ciprofloxacin  Nausea Only, Rash   Levaquin [levofloxacin In D5w] Nausea And Vomiting, Rash   Mental Status Changes, Confusion   Oxytetracycline Nausea And Vomiting   Wheat Rash        Medication List     STOP taking these medications    sodium chloride  0.9 % injection       TAKE these medications    (feeding supplement) PROSource Plus liquid Take 30 mLs by mouth 2 (two) times daily between meals.   acetaminophen  500 MG tablet Commonly known as: TYLENOL  Take 1,000 mg by mouth 2 (two) times daily.   ALPRAZolam  0.5 MG tablet Commonly known as: XANAX  Take 1 tablet (0.5 mg total) by mouth 2 (two) times daily as needed for anxiety. What changed:  when to take this reasons to take this   amLODipine  2.5 MG tablet Commonly known as: NORVASC  Take 2.5 mg by mouth every morning. Hold for SBP<110   Cholecalciferol  125 MCG (5000 UT) Tabs Take 1 tablet by mouth every morning.   Cranberry 450 MG Tabs Take 1 tablet by mouth in the morning and at bedtime. For UTI   estradiol  0.1 MG/GM vaginal cream Commonly known as: ESTRACE  Place 1 Applicatorful vaginally every other day. Discard plastic applicator. Insert a blueberry size amount (approximately 1 gram) of  cream on fingertip inside vagina at bedtime every other night for long term use.   ferrous sulfate  325 (65 FE) MG EC tablet Take 1 tablet (325 mg total) by mouth daily with breakfast.   fluticasone 50 MCG/ACT nasal spray Commonly known as: FLONASE Place 1 spray into both nostrils 2 (two) times daily.   folic acid  1 MG tablet Commonly known as: FOLVITE  Take 1 mg by mouth every morning.   gabapentin  100 MG capsule Commonly known as: NEURONTIN  Take 2 capsules (200 mg total) by mouth 3 (three) times daily as needed (neuropathy pain symptoms). What changed:  medication strength how much to take   Humira (2 Pen) 40 MG/0.8ML Ajkt pen Generic drug: adalimumab Inject 0.8 mLs into the skin every 14 (fourteen) days.   ipratropium-albuterol  0.5-2.5 (3) MG/3ML Soln Commonly known as: DUONEB Take 3 mLs by nebulization every 6 (six) hours as needed (shortness of breath, wheezing).   lansoprazole 30 MG capsule Commonly known as: PREVACID Take 60 mg by mouth 2 (two) times daily before a meal. DO NOT CHANGE PER MD   leflunomide  10 MG tablet Commonly known as: ARAVA  Take 10 mg by mouth every morning.   Melatonin 10 MG Tabs Take 10 mg by mouth at bedtime.   methenamine  1 g tablet Commonly known as: HIPREX  Take 1 tablet (  1 g total) by mouth 2 (two) times daily. Prophylactic   metoprolol  tartrate 25 MG tablet Commonly known as: LOPRESSOR  Take 0.5 tablets (12.5 mg total) by mouth 2 (two) times daily.   MULTIVITAMIN PO Take 1 tablet by mouth 2 (two) times daily.   ondansetron  4 MG tablet Commonly known as: ZOFRAN  Take 4 mg by mouth every 6 (six) hours as needed for vomiting or nausea.   Restasis  0.05 % ophthalmic emulsion Generic drug: cycloSPORINE  Place 1 drop into both eyes 2 (two) times daily. Dry eyes   senna 8.6 MG tablet Commonly known as: SENOKOT Take 1 tablet by mouth 2 (two) times daily.   sodium bicarbonate  650 MG tablet Take 650 mg by mouth 3 (three) times daily.  0900, 1400 and 2100   ticagrelor  60 MG Tabs tablet Commonly known as: BRILINTA  Take 60 mg by mouth every morning.   traMADol  50 MG tablet Commonly known as: ULTRAM  Take 2 tablets (100 mg total) by mouth every 6 (six) hours as needed for severe pain (pain score 7-10).   traZODone  100 MG tablet Commonly known as: DESYREL  Take 100 mg by mouth at bedtime.   vitamin B-12 250 MCG tablet Commonly known as: CYANOCOBALAMIN  Take 250 mcg by mouth every morning.        Contact information for follow-up providers     Randy Eva, NP. Schedule an appointment as soon as possible for a visit in 1 week(s).   Specialty: Nurse Practitioner Contact information: [address] [place] [address] [phone]              Contact information for after-discharge care     Destination     [facility] .   Service: Skilled Nursing Contact information: [address] [place]  [address] [phone]                    Feels better today  Discharge Exam: Filed Weights   06/07/24 1733 06/08/24 0315 06/08/24 0348  Weight: 77 kg 67.5 kg 67.5 kg   Physical Exam Vitals reviewed.  Constitutional:      General: She is not in acute distress.    Appearance: She is not ill-appearing or toxic-appearing.  Cardiovascular:     Rate and Rhythm: Normal rate and regular rhythm.     Heart sounds: No murmur heard. Pulmonary:     Effort: Pulmonary effort is normal. No respiratory distress.     Breath sounds: No wheezing, rhonchi or rales.  Neurological:     Mental Status: She is alert.  Psychiatric:        Mood and Affect: Mood normal.        Behavior: Behavior normal.      Potassium 3.2, replete Creatinine down to 1.53 which is close to baseline Hemoglobin stable at 10.0 Urine culture Klebsiella   Condition at discharge: good  The results of significant diagnostics from this hospitalization (including imaging,  microbiology, ancillary and laboratory) are listed below for reference.   Imaging Studies: IR NEPHROSTOMY PLACEMENT RIGHT Result Date: 06/10/2024 INDICATION: 79 year old female with history of bilateral percutaneous nephrostomy tubes for obstructive uropathy presenting with displaced indwelling right percutaneous nephrostomy tube and concern for developing urosepsis. EXAM: 1. ULTRASOUND GUIDANCE FOR PUNCTURE OF THE RIGHT RENAL COLLECTING SYSTEM 2. RIGHT PERCUTANEOUS NEPHROSTOMY TUBE PLACEMENT. 3. Fluoroscopic guided exchange of left nephrostomy tube COMPARISON:  06/08/2024 MEDICATIONS: The patient was currently receiving intravenous antibiotics as an inpatient. No additional antibiotics were  administered. ANESTHESIA/SEDATION: Moderate (conscious) sedation was employed during this procedure. A total of Versed  3 mg and Fentanyl  0 mcg was administered intravenously. Moderate Sedation Time: 25 minutes. The patient's level of consciousness and vital signs were monitored continuously by radiology nursing throughout the procedure under my direct supervision. CONTRAST:  6 mL Gadavist -administered into the renal collecting system FLUOROSCOPY TIME:  Nine mGy reference air kerma COMPLICATIONS: None immediate. PROCEDURE: The procedure, risks, benefits, and alternatives were explained to the patient. Questions regarding the procedure were encouraged and answered. The patient understands and consents to the procedure. A timeout was performed prior to the initiation of the procedure. The bilateral flank region was prepped and draped in the usual sterile fashion and a sterile drape was applied covering the operative field. A sterile gown and sterile gloves were used for the procedure. Local anesthesia was provided with 1% Lidocaine  with epinephrine . The displaced right nephrostomy tube was cut to release inter pigtail in removed. Attempts were made fluoroscopic guided recanalization of the established catheter track with a 5  French catheter and Glidewire in addition to injection of a small amount of Gadavist  which were unsuccessful. Therefore, ultrasound was used to localize the right kidney. Under direct ultrasound guidance, a 20 gauge needle was advanced into the renal collecting system. An ultrasound image documentation was performed. Access within the collecting system was confirmed with the efflux of purulent urine followed by limited contrast injection. Sample was sent for culture. Over a Nitrex wire, the tract was dilated with an Accustick stent. Next, under intermittent fluoroscpic guidance and over a short Amplatz wire, the track was dilated ultimately allowing placement of a 10-French percutaneous nephrostomy catheter which was advanced to the level of the renal pelvis where the coil was formed and locked. Contrast was injected and several spot fluoroscopic images were obtained in various obliquities. The catheter was secured at the skin with a silk retention suture and stat lock device and connected to a gravity bag was placed. Preprocedure imaging of the indwelling left percutaneous nephrostomy tube demonstrated appropriate position over the expected location of the renal pelvis. The external portion of the catheter was cut to release inter pigtail. An Amplatz wire was inserted over which the indwelling catheter was removed and a new, 10 French skater percutaneous nephrostomy tube was inserted. The pigtail portion was coiled and locked in the expected location of the renal pelvis. The drain was placed to bag drainage with immediate efflux of urine. The drain was affixed to the skin with a 0 silk interrupted suture. Dressings were applied. The patient tolerated procedure well without immediate postprocedural complication. IMPRESSION: 1. Successful ultrasound and fluoroscopic guided placement of a right sided 10 French PCN. 2. Technically successful fluoroscopic guided exchange of indwelling left 10 French percutaneous  nephrostomy tube. Ester Sides, MD [facility] Electronically Signed   By: Ester Sides M.D.   On: 06/10/2024 09:16   IR NEPHROSTOMY EXCHANGE LEFT Result Date: 06/10/2024 INDICATION: 79 year old female with history of bilateral percutaneous nephrostomy tubes for obstructive uropathy presenting with displaced indwelling right percutaneous nephrostomy tube and concern for developing urosepsis. EXAM: 1. ULTRASOUND GUIDANCE FOR PUNCTURE OF THE RIGHT RENAL COLLECTING SYSTEM 2. RIGHT PERCUTANEOUS NEPHROSTOMY TUBE PLACEMENT. 3. Fluoroscopic guided exchange of left nephrostomy tube COMPARISON:  06/08/2024 MEDICATIONS: The patient was currently receiving intravenous antibiotics as an inpatient. No additional antibiotics were administered. ANESTHESIA/SEDATION: Moderate (conscious) sedation was employed during this procedure. A total of Versed  3 mg and Fentanyl  0 mcg  was administered intravenously. Moderate Sedation Time: 25 minutes. The patient's level of consciousness and vital signs were monitored continuously by radiology nursing throughout the procedure under my direct supervision. CONTRAST:  6 mL Gadavist -administered into the renal collecting system FLUOROSCOPY TIME:  Nine mGy reference air kerma COMPLICATIONS: None immediate. PROCEDURE: The procedure, risks, benefits, and alternatives were explained to the patient. Questions regarding the procedure were encouraged and answered. The patient understands and consents to the procedure. A timeout was performed prior to the initiation of the procedure. The bilateral flank region was prepped and draped in the usual sterile fashion and a sterile drape was applied covering the operative field. A sterile gown and sterile gloves were used for the procedure. Local anesthesia was provided with 1% Lidocaine  with epinephrine . The displaced right nephrostomy tube was cut to release inter pigtail in removed. Attempts were  made fluoroscopic guided recanalization of the established catheter track with a 5 French catheter and Glidewire in addition to injection of a small amount of Gadavist  which were unsuccessful. Therefore, ultrasound was used to localize the right kidney. Under direct ultrasound guidance, a 20 gauge needle was advanced into the renal collecting system. An ultrasound image documentation was performed. Access within the collecting system was confirmed with the efflux of purulent urine followed by limited contrast injection. Sample was sent for culture. Over a Nitrex wire, the tract was dilated with an Accustick stent. Next, under intermittent fluoroscpic guidance and over a short Amplatz wire, the track was dilated ultimately allowing placement of a 10-French percutaneous nephrostomy catheter which was advanced to the level of the renal pelvis where the coil was formed and locked. Contrast was injected and several spot fluoroscopic images were obtained in various obliquities. The catheter was secured at the skin with a silk retention suture and stat lock device and connected to a gravity bag was placed. Preprocedure imaging of the indwelling left percutaneous nephrostomy tube demonstrated appropriate position over the expected location of the renal pelvis. The external portion of the catheter was cut to release inter pigtail. An Amplatz wire was inserted over which the indwelling catheter was removed and a new, 10 French skater percutaneous nephrostomy tube was inserted. The pigtail portion was coiled and locked in the expected location of the renal pelvis. The drain was placed to bag drainage with immediate efflux of urine. The drain was affixed to the skin with a 0 silk interrupted suture. Dressings were applied. The patient tolerated procedure well without immediate postprocedural complication. IMPRESSION: 1. Successful ultrasound and fluoroscopic guided placement of a right sided 10 French PCN. 2. Technically  successful fluoroscopic guided exchange of indwelling left 10 French percutaneous nephrostomy tube. Ester Sides, MD [facility] Electronically Signed   By: Ester Sides M.D.   On: 06/10/2024 09:16   CT ABDOMEN PELVIS WO CONTRAST Result Date: 06/09/2024 EXAM: CT ABDOMEN AND PELVIS WITHOUT CONTRAST 06/08/2024 12:18:27 PM TECHNIQUE: CT of the abdomen and pelvis was performed without the administration of intravenous contrast. Multiplanar reformatted images are provided for review. Automated exposure control, iterative reconstruction, and/or weight-based adjustment of the mA/kV was utilized to reduce the radiation dose to as low as reasonably achievable. COMPARISON: 03/28/2024 CLINICAL HISTORY: Hydronephrosis. FINDINGS: LOWER CHEST: Small bilateral pleural effusions. The right pleural effusion is decreased in volume from the previous exam. LIVER: Mild steatosis of the liver. GALLBLADDER AND BILE DUCTS: Status post cholecystectomy. No biliary ductal dilatation. SPLEEN: The spleen is within normal limits in size and appearance. PANCREAS:  Diffuse fatty infiltration of the pancreas without main duct dilatation, inflammation, or mass. ADRENAL GLANDS: Normal size and morphology bilaterally. No nodule, thickening, or hemorrhage. No periadrenal stranding. KIDNEYS, URETERS AND BLADDER: There is a right-sided percutaneous nephrostomy tube, which has been retracted with pigtail in the superficial subcutaneous soft tissues of the right posterior abdominal wall, image 34/2. Moderate right-sided hydronephrosis appears similar to the previous exam. There is right-sided perinephric fat stranding and right hydroureter, unchanged from previous study. With interval decompression of previous moderate hydronephrosis. Thick-walled urinary bladder with surrounding haziness is again noted and appears similar to the previous study. Foley catheter has been removed. No stones in the  kidneys or ureters. GI AND BOWEL: Stomach demonstrates no acute abnormality. No pathologic dilatation of the large or small bowel loops. Moderate retained stool noted within the rectum. Left-sided colonic diverticulosis without signs of acute diverticulitis. Similar appearance of intramural fatty deposition and mild wall thickening involving the ascending and proximal transverse colon. PERITONEUM AND RETROPERITONEUM: No significant free fluid or fluid collections within the abdomen or pelvis. No pneumoperitoneum. VASCULATURE: Aorta is normal in caliber. LYMPH NODES: No lymphadenopathy. REPRODUCTIVE ORGANS: No acute abnormality. BONES AND SOFT TISSUES: The bones are diffusely osteopenic. Multilevel lumbar degenerative disc disease. Mild presacral soft tissue edema. No acute osseous abnormality. No focal soft tissue abnormality. IMPRESSION: 1. Moderate right-sided hydronephrosis with associated right hydroureter and perinephric fat stranding, similar to prior, with interval retraction of the right percutaneous nephrostomy tube and the pigtail loop in the superficial subcutaneous tissues of the right posterior abdominal wall, most consistent with nephrostomy tube dislodgement and resulting obstructive uropathy; recommend urology/interventional radiology evaluation for repositioning or exchange. 2. Left-sided percutaneous nephrostomy tube in place with decompression of previous left hydronephrosis. 3. Thick-walled urinary bladder with surrounding haziness, similar to prior, most consistent with cystitis; consider urinalysis and urine culture. Electronically signed by: Waddell Calk MD 06/09/2024 07:44 AM EST RP Workstation: [id number]   DG Chest Port 1 View Result Date: 06/07/2024 CLINICAL DATA:  Altered mental status. EXAM: PORTABLE CHEST 1 VIEW COMPARISON:  Chest radiograph dated 04/02/2024. FINDINGS: Left lung base atelectasis or scarring. Trace left pleural effusion may be present. The right lung is clear. No  pneumothorax. Stable mild cardiomegaly. No acute osseous pathology. IMPRESSION: Left lung base atelectasis or scarring.  No interval change. Electronically Signed   By: Vanetta Chou M.D.   On: 06/07/2024 19:41    Microbiology: Results for orders placed or performed during the hospital encounter of 06/07/24  Aerobic/Anaerobic Culture w Gram Stain (surgical/deep wound)     Status: None   Collection Time: 06/09/24  6:47 PM   Specimen: Kidney; Urine  Result Value Ref Range Status   Specimen Description   Final    KIDNEY RIGHT Performed at [facility], [address] [place], [place] [address]    Special Requests   Final    NONE Performed at [facility], [address] [place], [place] [address]    Gram Stain   Final    RARE WBC PRESENT, PREDOMINANTLY PMN NO ORGANISMS SEEN    Culture   Final    MODERATE KLEBSIELLA PNEUMONIAE FEW CANDIDA ALBICANS NO ANAEROBES ISOLATED Performed at [facility], [address], [place], [place] [address]    Report Status 06/14/2024 FINAL  Final   Organism ID, Bacteria KLEBSIELLA PNEUMONIAE  Final      Susceptibility   Klebsiella pneumoniae - MIC*    AMPICILLIN >=32 RESISTANT Resistant  CEFAZOLIN (NON-URINE) 8 RESISTANT Resistant     CEFEPIME <=0.12 SENSITIVE Sensitive     ERTAPENEM  <=0.12 SENSITIVE Sensitive     CEFTRIAXONE  <=0.25 SENSITIVE Sensitive     CIPROFLOXACIN  0.25 SENSITIVE Sensitive     GENTAMICIN <=1 SENSITIVE Sensitive     MEROPENEM  <=0.25 SENSITIVE Sensitive     TRIMETH /SULFA  <=20 SENSITIVE Sensitive     AMPICILLIN/SULBACTAM >=32 RESISTANT Resistant     PIP/TAZO Value in next row Intermediate      64 INTERMEDIATEThis is a modified FDA-approved test that has been validated and its performance characteristics determined by the reporting laboratory.  This laboratory is certified under the Clinical Laboratory Improvement Amendments CLIA as qualified to perform high complexity clinical  laboratory testing.    * MODERATE KLEBSIELLA PNEUMONIAE    Labs: CBC: Recent Labs  Lab 06/07/24 1848 06/08/24 0556 06/09/24 0511 06/10/24 0949 06/11/24 0640 06/13/24 0506 06/14/24 0513  WBC 18.5*   < > 11.7* 8.3 8.0 7.6 11.0*  NEUTROABS 15.8*  --   --   --   --   --   --   HGB 7.8*   < > 7.1* 6.6* 9.1* 10.0* 10.0*  HCT 24.9*   < > 22.8* 20.8* 28.1* 30.2* 30.3*  MCV 102.0*   < > 102.7* 98.6 95.9 94.4 93.5  PLT 603*   < > 381 362 388 468* 513*   < > = values in this interval not displayed.   Basic Metabolic Panel: Recent Labs  Lab 06/08/24 0556 06/09/24 0511 06/11/24 0640 06/12/24 0539 06/13/24 0506 06/14/24 0513  NA 139 137 133* 134* 133* 134*  K 4.2 3.7 3.7 3.4* 3.3* 3.2*  CL 105 105 104 106 103 106  CO2 22 24 20* 22 21* 20*  GLUCOSE 111* 96 82 84 87 87  BUN 17 19 13 12 10 11   CREATININE 2.11* 2.06* 1.82* 1.64* 1.64* 1.53*  CALCIUM  7.8* 7.5* 7.4* 7.4* 7.3* 7.1*  MG 2.1  --   --   --   --   --   PHOS 3.3  --   --   --   --   --    Liver Function Tests: Recent Labs  Lab 06/07/24 1848 06/08/24 0556  AST 17 18  ALT <5 5  ALKPHOS 180* 194*  BILITOT 0.2 0.2  PROT 5.2* 5.6*  ALBUMIN 2.3* 2.4*   CBG: Recent Labs  Lab 06/07/24 1828  GLUCAP 106*    Discharge time spent: greater than 30 minutes.  Signed: Toribio Door, MD [facility] 06/14/2024 "

## 2024-07-07 ENCOUNTER — Encounter (HOSPITAL_COMMUNITY): Payer: Self-pay | Admitting: Emergency Medicine

## 2024-07-07 ENCOUNTER — Inpatient Hospital Stay (HOSPITAL_COMMUNITY)
Admission: EM | Admit: 2024-07-07 | Discharge: 2024-07-12 | DRG: 872 | Disposition: A | Source: Skilled Nursing Facility | Attending: Family Medicine | Admitting: Family Medicine

## 2024-07-07 ENCOUNTER — Emergency Department (HOSPITAL_COMMUNITY)

## 2024-07-07 ENCOUNTER — Other Ambulatory Visit: Payer: Self-pay

## 2024-07-07 DIAGNOSIS — R4189 Other symptoms and signs involving cognitive functions and awareness: Secondary | ICD-10-CM | POA: Diagnosis present

## 2024-07-07 DIAGNOSIS — K219 Gastro-esophageal reflux disease without esophagitis: Secondary | ICD-10-CM | POA: Diagnosis present

## 2024-07-07 DIAGNOSIS — Z880 Allergy status to penicillin: Secondary | ICD-10-CM

## 2024-07-07 DIAGNOSIS — Z1152 Encounter for screening for COVID-19: Secondary | ICD-10-CM

## 2024-07-07 DIAGNOSIS — R5381 Other malaise: Secondary | ICD-10-CM | POA: Diagnosis present

## 2024-07-07 DIAGNOSIS — E860 Dehydration: Secondary | ICD-10-CM | POA: Diagnosis present

## 2024-07-07 DIAGNOSIS — Z8673 Personal history of transient ischemic attack (TIA), and cerebral infarction without residual deficits: Secondary | ICD-10-CM

## 2024-07-07 DIAGNOSIS — Z886 Allergy status to analgesic agent status: Secondary | ICD-10-CM

## 2024-07-07 DIAGNOSIS — R54 Age-related physical debility: Secondary | ICD-10-CM | POA: Diagnosis present

## 2024-07-07 DIAGNOSIS — N1832 Chronic kidney disease, stage 3b: Secondary | ICD-10-CM | POA: Diagnosis present

## 2024-07-07 DIAGNOSIS — J101 Influenza due to other identified influenza virus with other respiratory manifestations: Secondary | ICD-10-CM | POA: Diagnosis present

## 2024-07-07 DIAGNOSIS — Z823 Family history of stroke: Secondary | ICD-10-CM

## 2024-07-07 DIAGNOSIS — A419 Sepsis, unspecified organism: Secondary | ICD-10-CM

## 2024-07-07 DIAGNOSIS — B961 Klebsiella pneumoniae [K. pneumoniae] as the cause of diseases classified elsewhere: Secondary | ICD-10-CM | POA: Diagnosis present

## 2024-07-07 DIAGNOSIS — Z936 Other artificial openings of urinary tract status: Secondary | ICD-10-CM

## 2024-07-07 DIAGNOSIS — Z87440 Personal history of urinary (tract) infections: Secondary | ICD-10-CM

## 2024-07-07 DIAGNOSIS — R627 Adult failure to thrive: Secondary | ICD-10-CM | POA: Diagnosis present

## 2024-07-07 DIAGNOSIS — D638 Anemia in other chronic diseases classified elsewhere: Secondary | ICD-10-CM | POA: Diagnosis present

## 2024-07-07 DIAGNOSIS — Z91041 Radiographic dye allergy status: Secondary | ICD-10-CM

## 2024-07-07 DIAGNOSIS — N390 Urinary tract infection, site not specified: Secondary | ICD-10-CM | POA: Diagnosis present

## 2024-07-07 DIAGNOSIS — A4189 Other specified sepsis: Principal | ICD-10-CM | POA: Diagnosis present

## 2024-07-07 DIAGNOSIS — Z6824 Body mass index (BMI) 24.0-24.9, adult: Secondary | ICD-10-CM

## 2024-07-07 DIAGNOSIS — Z9049 Acquired absence of other specified parts of digestive tract: Secondary | ICD-10-CM

## 2024-07-07 DIAGNOSIS — Z8249 Family history of ischemic heart disease and other diseases of the circulatory system: Secondary | ICD-10-CM

## 2024-07-07 DIAGNOSIS — Z7902 Long term (current) use of antithrombotics/antiplatelets: Secondary | ICD-10-CM

## 2024-07-07 DIAGNOSIS — J9811 Atelectasis: Secondary | ICD-10-CM | POA: Diagnosis present

## 2024-07-07 DIAGNOSIS — R748 Abnormal levels of other serum enzymes: Secondary | ICD-10-CM | POA: Diagnosis present

## 2024-07-07 DIAGNOSIS — Z79890 Hormone replacement therapy (postmenopausal): Secondary | ICD-10-CM

## 2024-07-07 DIAGNOSIS — E8809 Other disorders of plasma-protein metabolism, not elsewhere classified: Secondary | ICD-10-CM | POA: Diagnosis present

## 2024-07-07 DIAGNOSIS — J111 Influenza due to unidentified influenza virus with other respiratory manifestations: Principal | ICD-10-CM

## 2024-07-07 DIAGNOSIS — M797 Fibromyalgia: Secondary | ICD-10-CM | POA: Diagnosis present

## 2024-07-07 DIAGNOSIS — M069 Rheumatoid arthritis, unspecified: Secondary | ICD-10-CM | POA: Diagnosis present

## 2024-07-07 DIAGNOSIS — R601 Generalized edema: Secondary | ICD-10-CM | POA: Diagnosis present

## 2024-07-07 DIAGNOSIS — I129 Hypertensive chronic kidney disease with stage 1 through stage 4 chronic kidney disease, or unspecified chronic kidney disease: Secondary | ICD-10-CM | POA: Diagnosis present

## 2024-07-07 DIAGNOSIS — R531 Weakness: Secondary | ICD-10-CM

## 2024-07-07 DIAGNOSIS — F419 Anxiety disorder, unspecified: Secondary | ICD-10-CM | POA: Diagnosis present

## 2024-07-07 DIAGNOSIS — F32A Depression, unspecified: Secondary | ICD-10-CM | POA: Diagnosis present

## 2024-07-07 DIAGNOSIS — D509 Iron deficiency anemia, unspecified: Secondary | ICD-10-CM | POA: Diagnosis present

## 2024-07-07 DIAGNOSIS — R630 Anorexia: Secondary | ICD-10-CM | POA: Diagnosis present

## 2024-07-07 DIAGNOSIS — N320 Bladder-neck obstruction: Secondary | ICD-10-CM | POA: Diagnosis present

## 2024-07-07 DIAGNOSIS — Z79899 Other long term (current) drug therapy: Secondary | ICD-10-CM

## 2024-07-07 DIAGNOSIS — Z91013 Allergy to seafood: Secondary | ICD-10-CM

## 2024-07-07 DIAGNOSIS — Z885 Allergy status to narcotic agent status: Secondary | ICD-10-CM

## 2024-07-07 DIAGNOSIS — Z887 Allergy status to serum and vaccine status: Secondary | ICD-10-CM

## 2024-07-07 DIAGNOSIS — Z1624 Resistance to multiple antibiotics: Secondary | ICD-10-CM | POA: Diagnosis present

## 2024-07-07 DIAGNOSIS — Z66 Do not resuscitate: Secondary | ICD-10-CM | POA: Diagnosis present

## 2024-07-07 DIAGNOSIS — N139 Obstructive and reflux uropathy, unspecified: Secondary | ICD-10-CM | POA: Diagnosis present

## 2024-07-07 DIAGNOSIS — R0902 Hypoxemia: Secondary | ICD-10-CM | POA: Diagnosis present

## 2024-07-07 DIAGNOSIS — Z881 Allergy status to other antibiotic agents status: Secondary | ICD-10-CM

## 2024-07-07 DIAGNOSIS — N184 Chronic kidney disease, stage 4 (severe): Secondary | ICD-10-CM | POA: Diagnosis present

## 2024-07-07 DIAGNOSIS — Z888 Allergy status to other drugs, medicaments and biological substances status: Secondary | ICD-10-CM

## 2024-07-07 DIAGNOSIS — I10 Essential (primary) hypertension: Secondary | ICD-10-CM | POA: Diagnosis present

## 2024-07-07 DIAGNOSIS — Z90710 Acquired absence of both cervix and uterus: Secondary | ICD-10-CM

## 2024-07-07 DIAGNOSIS — D631 Anemia in chronic kidney disease: Secondary | ICD-10-CM | POA: Diagnosis present

## 2024-07-07 DIAGNOSIS — B9683 Acinetobacter baumannii as the cause of diseases classified elsewhere: Secondary | ICD-10-CM | POA: Diagnosis present

## 2024-07-07 LAB — URINALYSIS, W/ REFLEX TO CULTURE (INFECTION SUSPECTED)
Bilirubin Urine: NEGATIVE
Glucose, UA: NEGATIVE mg/dL
Ketones, ur: NEGATIVE mg/dL
Nitrite: NEGATIVE
Protein, ur: 100 mg/dL — AB
Specific Gravity, Urine: 1.013 (ref 1.005–1.030)
WBC, UA: >50 WBC/hpf (ref 0–5)
pH: 6 (ref 5.0–8.0)

## 2024-07-07 LAB — COMPREHENSIVE METABOLIC PANEL WITH GFR
ALT: 9 U/L (ref 0–44)
AST: 43 U/L — ABNORMAL HIGH (ref 15–41)
Albumin: 1.9 g/dL — ABNORMAL LOW (ref 3.5–5.0)
Alkaline Phosphatase: 290 U/L — ABNORMAL HIGH (ref 38–126)
Anion gap: 13 (ref 5–15)
BUN: 10 mg/dL (ref 8–23)
CO2: 21 mmol/L — ABNORMAL LOW (ref 22–32)
Calcium: 7.3 mg/dL — ABNORMAL LOW (ref 8.9–10.3)
Chloride: 107 mmol/L (ref 98–111)
Creatinine, Ser: 1.6 mg/dL — ABNORMAL HIGH (ref 0.44–1.00)
GFR, Estimated: 32 mL/min — ABNORMAL LOW
Glucose, Bld: 98 mg/dL (ref 70–99)
Potassium: 3.9 mmol/L (ref 3.5–5.1)
Sodium: 141 mmol/L (ref 135–145)
Total Bilirubin: <0.2 mg/dL (ref 0.0–1.2)
Total Protein: 5.3 g/dL — ABNORMAL LOW (ref 6.5–8.1)

## 2024-07-07 LAB — CBC WITH DIFFERENTIAL/PLATELET
Abs Immature Granulocytes: 0.06 10*3/uL (ref 0.00–0.07)
Basophils Absolute: 0.1 10*3/uL (ref 0.0–0.1)
Basophils Relative: 1 %
Eosinophils Absolute: 0 10*3/uL (ref 0.0–0.5)
Eosinophils Relative: 0 %
HCT: 30.1 % — ABNORMAL LOW (ref 36.0–46.0)
Hemoglobin: 9.5 g/dL — ABNORMAL LOW (ref 12.0–15.0)
Immature Granulocytes: 1 %
Lymphocytes Relative: 21 %
Lymphs Abs: 2.1 10*3/uL (ref 0.7–4.0)
MCH: 31.1 pg (ref 26.0–34.0)
MCHC: 31.6 g/dL (ref 30.0–36.0)
MCV: 98.7 fL (ref 80.0–100.0)
Monocytes Absolute: 0.9 10*3/uL (ref 0.1–1.0)
Monocytes Relative: 9 %
Neutro Abs: 7.1 10*3/uL (ref 1.7–7.7)
Neutrophils Relative %: 68 %
Platelets: 570 10*3/uL — ABNORMAL HIGH (ref 150–400)
RBC: 3.05 MIL/uL — ABNORMAL LOW (ref 3.87–5.11)
RDW: 16.3 % — ABNORMAL HIGH (ref 11.5–15.5)
WBC: 10.3 10*3/uL (ref 4.0–10.5)
nRBC: 0 % (ref 0.0–0.2)

## 2024-07-07 LAB — RESP PANEL BY RT-PCR (RSV, FLU A&B, COVID)  RVPGX2
Influenza A by PCR: POSITIVE — AB
Influenza B by PCR: NEGATIVE
Resp Syncytial Virus by PCR: NEGATIVE
SARS Coronavirus 2 by RT PCR: NEGATIVE

## 2024-07-07 LAB — PRO BRAIN NATRIURETIC PEPTIDE: Pro Brain Natriuretic Peptide: 6770 pg/mL — ABNORMAL HIGH

## 2024-07-07 LAB — CBG MONITORING, ED: Glucose-Capillary: 93 mg/dL (ref 70–99)

## 2024-07-07 LAB — LACTIC ACID, PLASMA
Lactic Acid, Venous: 0.7 mmol/L (ref 0.5–1.9)
Lactic Acid, Venous: 0.7 mmol/L (ref 0.5–1.9)

## 2024-07-07 LAB — PROTIME-INR
INR: 3.6 — ABNORMAL HIGH (ref 0.8–1.2)
Prothrombin Time: 37.7 s — ABNORMAL HIGH (ref 11.4–15.2)

## 2024-07-07 MED ORDER — DM-GUAIFENESIN ER 30-600 MG PO TB12
1.0000 | ORAL_TABLET | Freq: Two times a day (BID) | ORAL | Status: DC
  Administered 2024-07-07 – 2024-07-12 (×10): 1 via ORAL
  Filled 2024-07-07 (×10): qty 1

## 2024-07-07 MED ORDER — VANCOMYCIN HCL IN DEXTROSE 1-5 GM/200ML-% IV SOLN
1000.0000 mg | Freq: Once | INTRAVENOUS | Status: DC
  Filled 2024-07-07: qty 200

## 2024-07-07 MED ORDER — SODIUM CHLORIDE 0.9 % IV SOLN: INTRAVENOUS | Status: AC

## 2024-07-07 MED ORDER — PANTOPRAZOLE SODIUM 40 MG PO TBEC
40.0000 mg | DELAYED_RELEASE_TABLET | Freq: Every day | ORAL | Status: DC
  Administered 2024-07-08 – 2024-07-12 (×5): 40 mg via ORAL
  Filled 2024-07-07 (×5): qty 1

## 2024-07-07 MED ORDER — ENSURE MAX PROTEIN PO LIQD
237.0000 mL | Freq: Two times a day (BID) | ORAL | Status: DC
  Administered 2024-07-08 – 2024-07-10 (×5): 237 mL via ORAL

## 2024-07-07 MED ORDER — POLYETHYLENE GLYCOL 3350 17 G PO PACK: 17.0000 g | PACK | Freq: Every day | ORAL | Status: DC | PRN

## 2024-07-07 MED ORDER — ACETAMINOPHEN 650 MG RE SUPP: 650.0000 mg | Freq: Four times a day (QID) | RECTAL | Status: DC | PRN

## 2024-07-07 MED ORDER — METOPROLOL TARTRATE 25 MG PO TABS: 12.5000 mg | ORAL_TABLET | Freq: Two times a day (BID) | ORAL | Status: DC

## 2024-07-07 MED ORDER — ALBUTEROL SULFATE (2.5 MG/3ML) 0.083% IN NEBU: 2.5000 mg | INHALATION_SOLUTION | RESPIRATORY_TRACT | Status: DC | PRN

## 2024-07-07 MED ORDER — IPRATROPIUM-ALBUTEROL 0.5-2.5 (3) MG/3ML IN SOLN
3.0000 mL | Freq: Four times a day (QID) | RESPIRATORY_TRACT | Status: DC
  Administered 2024-07-07 – 2024-07-08 (×2): 3 mL via RESPIRATORY_TRACT
  Filled 2024-07-07 (×3): qty 3

## 2024-07-07 MED ORDER — BISACODYL 10 MG RE SUPP: 10.0000 mg | Freq: Every day | RECTAL | Status: DC | PRN

## 2024-07-07 MED ORDER — METOPROLOL TARTRATE 25 MG PO TABS
12.5000 mg | ORAL_TABLET | Freq: Two times a day (BID) | ORAL | Status: DC
  Administered 2024-07-08 – 2024-07-12 (×8): 12.5 mg via ORAL
  Filled 2024-07-07 (×8): qty 1

## 2024-07-07 MED ORDER — SODIUM CHLORIDE 0.9% FLUSH
3.0000 mL | Freq: Two times a day (BID) | INTRAVENOUS | Status: DC
  Administered 2024-07-07 – 2024-07-12 (×9): 3 mL via INTRAVENOUS

## 2024-07-07 MED ORDER — MIDODRINE HCL 5 MG PO TABS
10.0000 mg | ORAL_TABLET | Freq: Once | ORAL | Status: AC
  Administered 2024-07-07: 10 mg via ORAL
  Filled 2024-07-07: qty 2

## 2024-07-07 MED ORDER — ADULT MULTIVITAMIN W/MINERALS CH
1.0000 | ORAL_TABLET | Freq: Two times a day (BID) | ORAL | Status: DC
  Administered 2024-07-08 – 2024-07-11 (×4): 1 via ORAL
  Filled 2024-07-07 (×9): qty 1

## 2024-07-07 MED ORDER — VITAMIN C 500 MG PO TABS
500.0000 mg | ORAL_TABLET | Freq: Every day | ORAL | Status: DC
  Administered 2024-07-08 – 2024-07-12 (×5): 500 mg via ORAL
  Filled 2024-07-07 (×5): qty 1

## 2024-07-07 MED ORDER — OSELTAMIVIR PHOSPHATE 30 MG PO CAPS
30.0000 mg | ORAL_CAPSULE | Freq: Every day | ORAL | Status: AC
  Administered 2024-07-07 – 2024-07-11 (×5): 30 mg via ORAL
  Filled 2024-07-07 (×5): qty 1

## 2024-07-07 MED ORDER — TICAGRELOR 60 MG PO TABS: 60.0000 mg | ORAL_TABLET | Freq: Every morning | ORAL | Status: DC

## 2024-07-07 MED ORDER — ACETAMINOPHEN 325 MG PO TABS
650.0000 mg | ORAL_TABLET | Freq: Four times a day (QID) | ORAL | Status: DC | PRN
  Administered 2024-07-07 – 2024-07-12 (×10): 650 mg via ORAL
  Filled 2024-07-07 (×10): qty 2

## 2024-07-07 MED ORDER — SODIUM CHLORIDE 0.9 % IV SOLN
2.0000 g | Freq: Once | INTRAVENOUS | Status: DC
  Filled 2024-07-07: qty 10

## 2024-07-07 MED ORDER — CYCLOSPORINE 0.05 % OP EMUL
1.0000 [drp] | Freq: Two times a day (BID) | OPHTHALMIC | Status: DC
  Administered 2024-07-07 – 2024-07-12 (×10): 1 [drp] via OPHTHALMIC
  Filled 2024-07-07 (×10): qty 30

## 2024-07-07 MED ORDER — SODIUM CHLORIDE 0.9 % IV BOLUS
1000.0000 mL | Freq: Once | INTRAVENOUS | Status: AC
  Administered 2024-07-07: 1000 mL via INTRAVENOUS

## 2024-07-07 MED ORDER — SODIUM BICARBONATE 650 MG PO TABS
650.0000 mg | ORAL_TABLET | Freq: Three times a day (TID) | ORAL | Status: DC
  Administered 2024-07-07 – 2024-07-12 (×15): 650 mg via ORAL
  Filled 2024-07-07 (×15): qty 1

## 2024-07-07 MED ORDER — SODIUM CHLORIDE 0.9% FLUSH
3.0000 mL | Freq: Two times a day (BID) | INTRAVENOUS | Status: DC
  Administered 2024-07-07 – 2024-07-12 (×9): 3 mL via INTRAVENOUS

## 2024-07-07 MED ORDER — SODIUM CHLORIDE 0.9 % IV BOLUS (SEPSIS)
1000.0000 mL | Freq: Once | INTRAVENOUS | Status: AC
  Administered 2024-07-07: 1000 mL via INTRAVENOUS

## 2024-07-07 MED ORDER — SODIUM CHLORIDE 0.9 % IV SOLN
2.0000 g | Freq: Once | INTRAVENOUS | Status: AC
  Administered 2024-07-07: 2 g via INTRAVENOUS
  Filled 2024-07-07: qty 12.5

## 2024-07-07 MED ORDER — FERROUS SULFATE 325 (65 FE) MG PO TABS
325.0000 mg | ORAL_TABLET | Freq: Every day | ORAL | Status: DC
  Administered 2024-07-08 – 2024-07-12 (×5): 325 mg via ORAL
  Filled 2024-07-07 (×5): qty 1

## 2024-07-07 MED ORDER — SODIUM CHLORIDE 0.9% FLUSH
3.0000 mL | INTRAVENOUS | Status: DC | PRN
  Administered 2024-07-08: 3 mL via INTRAVENOUS

## 2024-07-07 MED ORDER — FOLIC ACID 1 MG PO TABS
1.0000 mg | ORAL_TABLET | Freq: Every morning | ORAL | Status: DC
  Administered 2024-07-08 – 2024-07-12 (×5): 1 mg via ORAL
  Filled 2024-07-07 (×5): qty 1

## 2024-07-07 MED ORDER — ONDANSETRON HCL 4 MG/2ML IJ SOLN
4.0000 mg | Freq: Four times a day (QID) | INTRAMUSCULAR | Status: DC | PRN
  Administered 2024-07-08: 4 mg via INTRAVENOUS
  Filled 2024-07-07: qty 2

## 2024-07-07 MED ORDER — ALPRAZOLAM 0.5 MG PO TABS
0.5000 mg | ORAL_TABLET | Freq: Two times a day (BID) | ORAL | Status: DC | PRN
  Administered 2024-07-08 – 2024-07-12 (×9): 0.5 mg via ORAL
  Filled 2024-07-07: qty 2
  Filled 2024-07-07 (×8): qty 1

## 2024-07-07 MED ORDER — TRAZODONE HCL 50 MG PO TABS
50.0000 mg | ORAL_TABLET | Freq: Every evening | ORAL | Status: DC | PRN
  Administered 2024-07-09 – 2024-07-10 (×3): 50 mg via ORAL
  Filled 2024-07-07 (×3): qty 1

## 2024-07-07 MED ORDER — VANCOMYCIN HCL 1500 MG/300ML IV SOLN
1500.0000 mg | Freq: Once | INTRAVENOUS | Status: AC
  Administered 2024-07-07: 1500 mg via INTRAVENOUS
  Filled 2024-07-07: qty 300

## 2024-07-07 MED ORDER — ACETAMINOPHEN 500 MG PO TABS
500.0000 mg | ORAL_TABLET | Freq: Once | ORAL | Status: AC
  Administered 2024-07-07: 500 mg via ORAL
  Filled 2024-07-07: qty 1

## 2024-07-07 MED ORDER — METRONIDAZOLE 500 MG/100ML IV SOLN
500.0000 mg | Freq: Once | INTRAVENOUS | Status: AC
  Administered 2024-07-07: 500 mg via INTRAVENOUS
  Filled 2024-07-07: qty 100

## 2024-07-07 MED ORDER — SODIUM CHLORIDE 0.9 % IV SOLN: INTRAVENOUS | Status: AC | PRN

## 2024-07-07 MED ORDER — ONDANSETRON HCL 4 MG PO TABS
4.0000 mg | ORAL_TABLET | Freq: Four times a day (QID) | ORAL | Status: DC | PRN
  Administered 2024-07-12: 4 mg via ORAL
  Filled 2024-07-07: qty 1

## 2024-07-07 NOTE — Plan of Care (Signed)

## 2024-07-07 NOTE — ED Triage Notes (Signed)
 Pt in by [facility] from [facility] for a code sepsis. Nursing staff at facility states patient is altered today, febrile, tachycardia and requiring o2. Pt has nephrostomy tubes in place and hx of sepsis per ems. Pt given 650 mg Tylenol  PO by facility @ 0930.

## 2024-07-07 NOTE — H&P (Signed)
 " History and Physical   Patient: Robin Blackburn FMW:[id number] DOB: [birth year] DOA: 07/07/2024 DOS: the patient was seen and examined on 07/07/2024 PCP: Randy Eva, NP  Patient coming from: SNF--[facility]  Chief Complaint:  Chief Complaint  Patient presents with   Code Sepsis   HPI: Robin Blackburn is a 79 y.o. 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 -- Fevers noted  -- Patient is somewhat sleepy, she is able to answer simple questions but overall not a good historian Mrs Robin Blackburn [facility]) at bedside and providing further information at this time - No emesis, no diarrhea - In the ED-patient is found to be hypotensive IV fluid boluses given  -In the ED influenza A is positive, RSV and COVID-negative -- Chest x-ray with atelectasis but no pneumonia - WBC 10.3, lactic acid is not elevated -Hgb 9.5 which is similar to prior - UA from nephrostomy tube suggestive of UTI but may be contaminated- -creatinine 1.60 which is similar to prior - Mild elevated LFTs similar to prior - Sodium,  potassium and chloride WNL  Review of Systems: As mentioned in the history of present illness. All other systems reviewed and are negative. 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   Depression    Fibromyalgia    Hypertension    Neuropathy    Rheumatoid arthritis (HCC)    Stroke (HCC) 02/17/14   Past Surgical History:  Procedure Laterality Date   ABDOMINAL HYSTERECTOMY     ANKLE RECONSTRUCTION     APPENDECTOMY     BACK SURGERY     CHOLECYSTECTOMY     IR  NEPHROSTOMY EXCHANGE LEFT  06/09/2024   IR NEPHROSTOMY PLACEMENT LEFT  04/22/2024   IR NEPHROSTOMY PLACEMENT RIGHT  04/01/2024   IR NEPHROSTOMY PLACEMENT RIGHT  06/09/2024   KNEE SURGERY     Social History:  reports that she has never smoked. She has never been exposed to tobacco smoke. She has never used smokeless tobacco. She reports that she does not drink alcohol  and does not use drugs.  Allergies[1]  Family History  Problem Relation Age of Onset   Breast cancer Mother    Hypertension Father    Transient ischemic attack Father    Lung cancer Father    Hypertension Brother    Leukemia Brother    Leukemia Brother    Leukemia Brother    CVA Maternal Grandfather     Prior to Admission medications  Medication Sig Start Date End Date Taking? Authorizing Provider  acetaminophen  (TYLENOL ) 500 MG tablet Take 1,000 mg by mouth 2 (two) times daily.   Yes [provider]  Adalimumab (HUMIRA, 2 PEN,) 40 MG/0.8ML PNKT Inject 0.8 mLs into the skin every 14 (fourteen) days.   Yes [provider]  ALPRAZolam  (XANAX ) 0.5 MG tablet Take 1 tablet (0.5 mg total) by mouth 2 (two) times daily as needed for anxiety. 06/14/24  Yes [name], MD  Cranberry 450 MG TABS Take 1 tablet by mouth in the morning and at bedtime. For UTI   Yes [provider]  cycloSPORINE  (RESTASIS ) 0.05 % ophthalmic emulsion  Place 1 drop into both eyes 2 (two) times daily. Dry eyes   Yes [provider]  fluticasone (FLONASE) 50 MCG/ACT nasal spray Place 1 spray into both nostrils 2 (two) times daily.   Yes [provider]  gabapentin  (NEURONTIN ) 100 MG capsule Take 2 capsules (200 mg total) by mouth 3 (three) times daily as needed (neuropathy pain symptoms). 06/14/24  Yes [name], MD  ipratropium-albuterol  (DUONEB) 0.5-2.5 (3) MG/3ML SOLN Take 3 mLs by nebulization every 6 (six) hours as needed (shortness of breath, wheezing). 05/10/24  Yes [provider]   lansoprazole (PREVACID) 30 MG capsule Take 60 mg by mouth 2 (two) times daily before a meal. DO NOT CHANGE PER MD   Yes [provider]  methenamine  (HIPREX ) 1 g tablet Take 1 tablet (1 g total) by mouth 2 (two) times daily. Prophylactic 01/22/24  Yes [name], [name], MD  metoprolol  tartrate (LOPRESSOR ) 25 MG tablet Take 0.5 tablets (12.5 mg total) by mouth 2 (two) times daily. 01/09/24  Yes [name], [name], MD  Multiple Vitamin (MULTIVITAMIN PO) Take 1 tablet by mouth 2 (two) times daily.   Yes [provider]  Nutritional Supplements (,FEEDING SUPPLEMENT, PROSOURCE PLUS) liquid Take 30 mLs by mouth 2 (two) times daily between meals. 06/14/24  Yes [name], MD  ondansetron  (ZOFRAN ) 4 MG tablet Take 4 mg by mouth every 6 (six) hours as needed for vomiting or nausea. 02/13/24  Yes [provider]  Pollen Extracts (PROSTAT PO) Take 30 mLs by mouth daily.   Yes [provider]  senna (SENOKOT) 8.6 MG tablet Take 1 tablet by mouth 2 (two) times daily.   Yes [provider]  sodium bicarbonate  650 MG tablet Take 650 mg by mouth 3 (three) times daily. 0900, 1400 and 2100   Yes [provider]  traMADol  (ULTRAM ) 50 MG tablet Take 2 tablets (100 mg total) by mouth every 6 (six) hours as needed for severe pain (pain score 7-10). 06/14/24  Yes [name], MD  traZODone  (DESYREL ) 100 MG tablet Take 100 mg by mouth at bedtime. 02/25/23  Yes [provider]  amLODipine  (NORVASC ) 2.5 MG tablet Take 2.5 mg by mouth every morning. Hold for SBP<110 12/10/23   [provider]  estradiol  (ESTRACE ) 0.1 MG/GM vaginal cream Place 1 Applicatorful vaginally every other day. Discard plastic applicator. Insert a blueberry size amount (approximately 1 gram) of cream on fingertip inside vagina at bedtime every other night for long term use. 01/09/24   [name], MD  leflunomide  (ARAVA ) 10 MG tablet Take 10 mg by mouth every morning.  08/01/20   [provider]  ticagrelor  (BRILINTA ) 60 MG TABS tablet Take 60 mg by mouth every morning.    [provider]   Physical Exam: Vitals:   07/07/24 1511 07/07/24 1601 07/07/24 1630 07/07/24 1700  BP: (!) 101/54 107/63 (!) 87/54 (!) 98/51  Pulse: 92 95 92 92  Resp: 12 15 12 12   Temp:  (!) 97.5 F (36.4 C)    TempSrc:  Axillary    SpO2: 99% 98% 97% 97%  Weight:      Height:        Physical Exam Gen:-Sleepy but easily arousable, able to answer simple questions, in no acute distress  HEENT:- [place].AT, No sclera icterus Nose-  2L/min Neck-Supple Neck,No JVD,.  Lungs-diminished breath sounds, few scattered wheezes bilaterally CV- S1, S2 normal, RRR Abd-  +ve B.Sounds, Abd Soft, No tenderness, bilateral  nephrostomy tubes    Extremity/Skin:- No  edema,   good pedal pulses  Psych-affect is flat, oriented  Neuro-generalized weakness, no new focal deficits, no tremors  Data Reviewed: -In the ED influenza A is positive, RSV and COVID-negative -- Chest x-ray with atelectasis but no pneumonia - WBC 10.3, lactic acid is not elevated -Hgb 9.5 which is similar to prior - UA from nephrostomy tube suggestive of UTI but may be contaminated- -creatinine 1.60 which is similar to prior - Mild elevated LFTs similar to prior - Sodium,  potassium and chloride WNL  Assessment and Plan: 1) influenza A--- supportive and symptomatic treatment -Chest x-ray with atelectasis but no pneumonia - WBC 10.3, lactic acid is not elevated -- Continue bronchodilators, mucolytics and Tamiflu   2)Possible UTI--- UA from nephrostomy tube suggestive of UTI but may be contaminated- -no leukocytosis --await urine culture  3)HTN--hold metoprolol  for at least 24 hours until BP recovers  4) chronic anemia--multifactorial in the setting of CKD and iron deficiency --Hgb 9.5 which is similar to prior -Continue iron supplementation and folic acid   5)GERD--- continue Protonix   6)CKD stage -  3B  --creatinine 1.60 which is similar to prior , renally adjust medications, avoid nephrotoxic agents / dehydration  / hypotension  7)Social/Ethics--- patient is a DNR/DNI,  -discussed with Mrs Robin Blackburn [facility])  8)Dehydration/Poor oral intake due to #1 above-- --continue IV fluids until oral intake improves    Advance Care Planning:   Code Status: Limited: Do not attempt resuscitation (DNR) -DNR-LIMITED -Do Not Intubate/DNI     Family Communication: Discussed with Mrs Robin Blackburn [facility]) at bedside    Severity of Illness: The appropriate patient status for this patient is OBSERVATION. Observation status is judged to be reasonable and necessary in order to provide the required intensity of service to ensure the patient's safety. The patient's presenting symptoms, physical exam findings, and initial radiographic and laboratory data in the context of their medical condition is felt to place them at decreased risk for further clinical deterioration. Furthermore, it is anticipated that the patient will be medically stable for discharge from the hospital within 2 midnights of admission.   Author: Rendall Carwin, MD 07/07/2024 6:07 PM  For on call review [email or web address].      [1]  Allergies Allergen Reactions   Cortisone Anaphylaxis    Cardiovascular Arrest   Fentanyl  Anaphylaxis, Hives and Other (See Comments)    felt like I had demons in my head   Hydromorphone  Anaphylaxis and Nausea And Vomiting    GI Intolerance   Iodinated Contrast Media Anaphylaxis, Hives, Rash and Dermatitis    Respiratory Distress  Iodinated contrast media (substance)   Keflex  [Cephalexin ] Nausea And Vomiting   Meperidine Anaphylaxis and Shortness Of Breath    Respiratory Distress   Morphine Anaphylaxis, Nausea And Vomiting, Swelling and Rash    GI Intolerance   Oxycodone -Acetaminophen  Anaphylaxis, Nausea And Vomiting, Swelling, Dermatitis and Rash    GI Intolerance, Mouth  swelling.  acetaminophen  / oxycodone    Penicillins Anaphylaxis, Nausea And Vomiting and Other (See Comments)    Immediate rash, facial/tongue/throat swelling, SOB or lightheadedness with hypotension  Product containing penicillin (product)   Afluria Preservative Free [Influenza Virus Vacc Split Pf] Other (See Comments)    Unknown   Aspirin  Other (See Comments)    GI Intolerance   Codeine Other (See Comments)    Unknown    Influenza Virus Vaccine Other (See Comments)    Unknown    Iohexol  Other (See Comments)  Unknown reaction   Meperidine Hcl Other (See Comments)    Unknown   Oxycodone  Hcl Other (See Comments)    Unknown   Shellfish Allergy Other (See Comments)    Glucosamine not an option Unknown    Sulfa  Antibiotics Nausea And Vomiting    Tolerates Bactrim  though   Troleandomycin Nausea And Vomiting and Swelling    GI Intolerance   Potassium Chloride  Other (See Comments)    No reaction listed on MAR   Bisphosphonates Hives and Other (See Comments)    GI intolerance   Ciprofloxacin  Nausea Only and Rash   Levaquin [Levofloxacin In D5w] Nausea And Vomiting and Rash    Mental Status Changes, Confusion   Oxytetracycline Nausea And Vomiting   Wheat Rash   "

## 2024-07-07 NOTE — Sepsis Progress Note (Signed)
 Elink will follow per sepsis protocol.

## 2024-07-07 NOTE — ED Provider Notes (Signed)
 " [place] EMERGENCY DEPARTMENT AT [facility] Provider Note   CSN: [id number] Arrival date & time: 07/07/24  1041     Patient presents with: Code Sepsis   Robin Blackburn is a 79 y.o. female.   HPI Patient presents from a local nursing facility with concern for decreased interactivity, fever, tachycardia. Patient has a history of sepsis. EMS reports patient was febrile, tachycardic and tachypneic in transport.  Level 5 caveat, patient only reports feeling unwell    Prior to Admission medications  Medication Sig Start Date End Date Taking? Authorizing Provider  acetaminophen  (TYLENOL ) 500 MG tablet Take 1,000 mg by mouth 2 (two) times daily.    [provider]  Adalimumab (HUMIRA, 2 PEN,) 40 MG/0.8ML PNKT Inject 0.8 mLs into the skin every 14 (fourteen) days.    [provider]  ALPRAZolam  (XANAX ) 0.5 MG tablet Take 1 tablet (0.5 mg total) by mouth 2 (two) times daily as needed for anxiety. 06/14/24   [name], MD  amLODipine  (NORVASC ) 2.5 MG tablet Take 2.5 mg by mouth every morning. Hold for SBP<110 12/10/23   [provider]  Cholecalciferol  5000 units TABS Take 1 tablet by mouth every morning.    [provider]  Cranberry 450 MG TABS Take 1 tablet by mouth in the morning and at bedtime. For UTI    [provider]  cycloSPORINE  (RESTASIS ) 0.05 % ophthalmic emulsion Place 1 drop into both eyes 2 (two) times daily. Dry eyes    [provider]  estradiol  (ESTRACE ) 0.1 MG/GM vaginal cream Place 1 Applicatorful vaginally every other day. Discard plastic applicator. Insert a blueberry size amount (approximately 1 gram) of cream on fingertip inside vagina at bedtime every other night for long term use. 01/09/24   [name], MD  ferrous sulfate  325 (65 FE) MG EC tablet Take 1 tablet (325 mg total) by mouth daily with breakfast. 02/04/24   [name], PA-C  fluticasone (FLONASE) 50 MCG/ACT nasal spray Place 1  spray into both nostrils 2 (two) times daily.    [provider]  folic acid  (FOLVITE ) 1 MG tablet Take 1 mg by mouth every morning.    [provider]  gabapentin  (NEURONTIN ) 100 MG capsule Take 2 capsules (200 mg total) by mouth 3 (three) times daily as needed (neuropathy pain symptoms). 06/14/24   [name], MD  ipratropium-albuterol  (DUONEB) 0.5-2.5 (3) MG/3ML SOLN Take 3 mLs by nebulization every 6 (six) hours as needed (shortness of breath, wheezing). 05/10/24   [provider]  lansoprazole (PREVACID) 30 MG capsule Take 60 mg by mouth 2 (two) times daily before a meal. DO NOT CHANGE PER MD    [provider]  leflunomide  (ARAVA ) 10 MG tablet Take 10 mg by mouth every morning. 08/01/20   [provider]  Melatonin 10 MG TABS Take 10 mg by mouth at bedtime.    [provider]  methenamine  (HIPREX ) 1 g tablet Take 1 tablet (1 g total) by mouth 2 (two) times daily. Prophylactic 01/22/24   [name], [name], MD  metoprolol  tartrate (LOPRESSOR ) 25 MG tablet Take 0.5 tablets (12.5 mg total) by mouth 2 (two) times daily. 01/09/24   [name], MD  Multiple Vitamin (MULTIVITAMIN PO) Take 1 tablet by mouth 2 (two) times daily.    [provider]  Nutritional Supplements (,FEEDING SUPPLEMENT, PROSOURCE PLUS) liquid Take 30 mLs by mouth 2 (two) times daily between meals. 06/14/24   Jadine,  Toribio SQUIBB, MD  ondansetron  (ZOFRAN ) 4 MG tablet Take 4 mg by mouth every 6 (six) hours as needed for vomiting or nausea. 02/13/24   [provider]  senna (SENOKOT) 8.6 MG tablet Take 1 tablet by mouth 2 (two) times daily.    [provider]  sodium bicarbonate  650 MG tablet Take 650 mg by mouth 3 (three) times daily. 0900, 1400 and 2100    [provider]  ticagrelor  (BRILINTA ) 60 MG TABS tablet Take 60 mg by mouth every morning.    [provider]  traMADol  (ULTRAM ) 50 MG tablet Take 2 tablets (100 mg total) by  mouth every 6 (six) hours as needed for severe pain (pain score 7-10). 06/14/24   [name], MD  traZODone  (DESYREL ) 100 MG tablet Take 100 mg by mouth at bedtime. 02/25/23   [provider]  vitamin B-12 (CYANOCOBALAMIN ) 250 MCG tablet Take 250 mcg by mouth every morning.    [provider]    Allergies: Cortisone, Fentanyl , Hydromorphone , Iodinated contrast media, Keflex  [cephalexin ], Meperidine, Morphine, Oxycodone -acetaminophen , Penicillins, Afluria preservative free [influenza virus vacc split pf], Aspirin , Codeine, Influenza virus vaccine, Iohexol , Meperidine hcl, Oxycodone  hcl, Shellfish allergy, Sulfa  antibiotics, Troleandomycin, Potassium chloride , Bisphosphonates, Ciprofloxacin , Levaquin [levofloxacin in d5w], Oxytetracycline, and Wheat    Review of Systems  Updated Vital Signs BP (!) 114/50   Pulse (!) 106   Temp (!) 103.1 F (39.5 C) (Rectal)   Resp 17   Ht 1.676 m (5' 6)   Wt 68 kg   SpO2 97%   BMI 24.20 kg/m   Physical Exam Vitals and nursing note reviewed.  Constitutional:      Appearance: She is well-developed. She is obese. She is ill-appearing and diaphoretic.  HENT:     Head: Normocephalic and atraumatic.  Eyes:     Conjunctiva/sclera: Conjunctivae normal.  Cardiovascular:     Rate and Rhythm: Regular rhythm. Tachycardia present.  Pulmonary:     Effort: Tachypnea present.     Breath sounds: Decreased air movement present. No stridor.  Abdominal:     General: There is no distension.     Comments: Bilateral nephrostomy tubes draining  Skin:    General: Skin is warm.  Neurological:     Mental Status: She is alert and oriented to person, place, and time.     Cranial Nerves: No cranial nerve deficit.     Motor: Atrophy present.  Psychiatric:        Behavior: Behavior is slowed and withdrawn.     Comments: Patient speaks only briefly, offering that she is uncomfortable, and thirsty.     (all labs ordered are listed, but only  abnormal results are displayed) Labs Reviewed  RESP PANEL BY RT-PCR (RSV, FLU A&B, COVID)  RVPGX2 - Abnormal; Notable for the following components:      Result Value   Influenza A by PCR POSITIVE (*)    All other components within normal limits  COMPREHENSIVE METABOLIC PANEL WITH GFR - Abnormal; Notable for the following components:   CO2 21 (*)    Creatinine, Ser 1.60 (*)    Calcium  7.3 (*)    Total Protein 5.3 (*)    Albumin 1.9 (*)    AST 43 (*)    Alkaline Phosphatase 290 (*)    GFR, Estimated 32 (*)    All other components within normal limits  CBC WITH DIFFERENTIAL/PLATELET - Abnormal; Notable for the following components:   RBC 3.05 (*)    Hemoglobin  9.5 (*)    HCT 30.1 (*)    RDW 16.3 (*)    Platelets 570 (*)    All other components within normal limits  PROTIME-INR - Abnormal; Notable for the following components:   Prothrombin Time 37.7 (*)    INR 3.6 (*)    All other components within normal limits  URINALYSIS, W/ REFLEX TO CULTURE (INFECTION SUSPECTED) - Abnormal; Notable for the following components:   APPearance CLOUDY (*)    Hgb urine dipstick SMALL (*)    Protein, ur 100 (*)    Leukocytes,Ua LARGE (*)    Bacteria, UA MANY (*)    All other components within normal limits  PRO BRAIN NATRIURETIC PEPTIDE - Abnormal; Notable for the following components:   Pro Brain Natriuretic Peptide 6,770.0 (*)    All other components within normal limits  CULTURE, BLOOD (ROUTINE X 2)  CULTURE, BLOOD (ROUTINE X 2)  URINE CULTURE  LACTIC ACID, PLASMA  LACTIC ACID, PLASMA  CBG MONITORING, ED    EKG: EKG Interpretation Date/Time:  Thursday July 07 2024 10:47:36 EST Ventricular Rate:  133 PR Interval:  45 QRS Duration:  83 QT Interval:  294 QTC Calculation: 423 R Axis:   33  Text Interpretation: Sinus tachycardia with irregular rate Probable inferior infarct, age indeterminate Artifact ST-t wave abnormality Baseline wander Confirmed by Garrick Charleston [phone]) on  07/07/2024 11:36:47 AM  Radiology: [facility] Chest Port 1 View Result Date: 07/07/2024 EXAM: 1 VIEW(S) XRAY OF THE CHEST 07/07/2024 11:25:00 AM COMPARISON: 06/07/2024 CLINICAL HISTORY: Questionable sepsis; evaluate for abnormality. FINDINGS: LINES, TUBES AND DEVICES: Originating from the left upper extremity is what appears to be a PICC line which courses towards the midline of the chest in the projection of the left apex, terminating along the midline of the superior mediastinum. LUNGS AND PLEURA: Low lung volumes. Linear atelectasis or scarring in medial right base. No pleural effusion. No pneumothorax. Clinical correlation is recommended. HEART AND MEDIASTINUM: No acute abnormality of the cardiac and mediastinal silhouettes. BONES AND SOFT TISSUES: No acute osseous abnormality. IMPRESSION: 1. No focal airspace consolidation identified; linear opacity at the medial right lung base most consistent with subsegmental atelectasis versus scarring. 2. Left upper extremity PICC terminates at the superior mediastinum. The tip is presumed to be within the left brachiocephalic vein. Electronically signed by: Waddell Calk MD 07/07/2024 12:22 PM EST RP Workstation: [id number]     Procedures   Medications Ordered in the ED  vancomycin  (VANCOREADY) IVPB 1500 mg/300 mL (1,500 mg Intravenous New Bag/Given 07/07/24 1209)  sodium chloride  0.9 % bolus 1,000 mL (0 mLs Intravenous Stopped 07/07/24 1217)  metroNIDAZOLE  (FLAGYL ) IVPB 500 mg (0 mg Intravenous Stopped 07/07/24 1219)  ceFEPIme  (MAXIPIME ) 2 g in sodium chloride  0.9 % 100 mL IVPB (0 g Intravenous Stopped 07/07/24 1149)  acetaminophen  (TYLENOL ) tablet 500 mg (500 mg Oral Given 07/07/24 1123)                                    Medical Decision Making Elderly female presents from nursing facility with signs and symptoms of sepsis.  With bilateral nephrostomy tubes, tachypnea, hypoxia, broad differential including pneumonia, urinary tract infection, patient received  empiric antibiotics, fluids, monitoring. Cardiac 125 sinus tach abnormal pulse ox 92% supplemental oxygen  abnormal After initial resuscitation discussed case with her healthcare power of attorney, Hart, who confirms limited DNR, patient will continue medical efforts, previously has noted no intubation,  no CPR.  Amount and/or Complexity of Data Reviewed Independent Historian: EMS    Details: Healthcare power of attorney External Data Reviewed: notes. Labs: ordered. Decision-making details documented in ED Course. Radiology: ordered and independent interpretation performed. Decision-making details documented in ED Course. ECG/medicine tests: ordered and independent interpretation performed. Decision-making details documented in ED Course.  Risk OTC drugs. Prescription drug management.   1:46 PM Heart rate now 105, patient remained in similar condition clinically, on supplemental oxygen .  Labs notable for no lactic acidosis, no leukocytosis, patient has improved hemodynamically, has findings concerning for influenza, and with sepsis criteria met, will be admitted for further monitoring, management.  CRITICAL CARE Performed by: Lamar Salen Total critical care time: 35 minutes Critical care time was exclusive of separately billable procedures and treating other patients. Critical care was necessary to treat or prevent imminent or life-threatening deterioration. Critical care was time spent personally by me on the following activities: development of treatment plan with patient and/or surrogate as well as nursing, discussions with consultants, evaluation of patient's response to treatment, examination of patient, obtaining history from patient or surrogate, ordering and performing treatments and interventions, ordering and review of laboratory studies, ordering and review of radiographic studies, pulse oximetry and re-evaluation of patient's condition.   Final diagnoses:  Influenza   Severe sepsis [facility])    ED Discharge Orders     None          Salen Lamar, MD 07/07/24 1350  "

## 2024-07-08 ENCOUNTER — Observation Stay (HOSPITAL_COMMUNITY)

## 2024-07-08 ENCOUNTER — Inpatient Hospital Stay (HOSPITAL_COMMUNITY)

## 2024-07-08 DIAGNOSIS — J101 Influenza due to other identified influenza virus with other respiratory manifestations: Secondary | ICD-10-CM | POA: Diagnosis not present

## 2024-07-08 DIAGNOSIS — I10 Essential (primary) hypertension: Secondary | ICD-10-CM

## 2024-07-08 DIAGNOSIS — R627 Adult failure to thrive: Secondary | ICD-10-CM | POA: Diagnosis not present

## 2024-07-08 DIAGNOSIS — Z8619 Personal history of other infectious and parasitic diseases: Secondary | ICD-10-CM

## 2024-07-08 DIAGNOSIS — D638 Anemia in other chronic diseases classified elsewhere: Secondary | ICD-10-CM | POA: Diagnosis not present

## 2024-07-08 DIAGNOSIS — R0609 Other forms of dyspnea: Secondary | ICD-10-CM | POA: Diagnosis not present

## 2024-07-08 DIAGNOSIS — R5381 Other malaise: Secondary | ICD-10-CM | POA: Diagnosis not present

## 2024-07-08 DIAGNOSIS — N184 Chronic kidney disease, stage 4 (severe): Secondary | ICD-10-CM | POA: Diagnosis not present

## 2024-07-08 LAB — ECHOCARDIOGRAM COMPLETE
AR max vel: 2.42 cm2
AV Area VTI: 3.4 cm2
AV Area mean vel: 2.7 cm2
AV Mean grad: 4.9 mmHg
AV Peak grad: 11.7 mmHg
Ao pk vel: 1.71 m/s
Area-P 1/2: 3.51 cm2
Calc EF: 64.8 %
Height: 66 in
P 1/2 time: 329 ms
S' Lateral: 3 cm
Single Plane A2C EF: 66.5 %
Single Plane A4C EF: 62.9 %
Weight: 2398.6 [oz_av]

## 2024-07-08 LAB — MRSA NEXT GEN BY PCR, NASAL: MRSA by PCR Next Gen: DETECTED — AB

## 2024-07-08 MED ORDER — IPRATROPIUM-ALBUTEROL 0.5-2.5 (3) MG/3ML IN SOLN
3.0000 mL | Freq: Three times a day (TID) | RESPIRATORY_TRACT | Status: DC
  Administered 2024-07-08 – 2024-07-11 (×9): 3 mL via RESPIRATORY_TRACT
  Filled 2024-07-08 (×8): qty 3

## 2024-07-08 MED ORDER — CHLORHEXIDINE GLUCONATE CLOTH 2 % EX PADS
6.0000 | MEDICATED_PAD | Freq: Every day | CUTANEOUS | Status: AC
  Administered 2024-07-08 – 2024-07-12 (×5): 6 via TOPICAL

## 2024-07-08 MED ORDER — MUPIROCIN 2 % EX OINT
1.0000 | TOPICAL_OINTMENT | Freq: Two times a day (BID) | CUTANEOUS | Status: DC
  Administered 2024-07-08 – 2024-07-12 (×8): 1 via NASAL
  Filled 2024-07-08 (×2): qty 22

## 2024-07-08 MED ORDER — SODIUM CHLORIDE 0.9 % IV SOLN
1.0000 g | Freq: Two times a day (BID) | INTRAVENOUS | Status: DC
  Administered 2024-07-08 – 2024-07-10 (×4): 1 g via INTRAVENOUS
  Filled 2024-07-08 (×4): qty 20

## 2024-07-08 NOTE — Progress Notes (Signed)
 " Progress Note   Patient: Robin Blackburn FMW:[id number] DOB: 1946/03/27 DOA: 07/07/2024     0 DOS: the patient was seen and examined on 07/08/2024   Brief hospital course:  BERMA HARTS is a 79 y.o. 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   Assessment and Plan: Sepsis secondary to viral illness- Presented with fever, tachycardia, tachypnea. Influenza A positive. Continue bronchodilators, mucolytics and Tamiflu . Patient is dehydrated in the setting of viral illness. Caution with IV fluids given elevated BNP. Check echocardiogram rule out CHF.  Gram-negative UTI---  Urine cultures grew gram-negative rods, Klebsiella. Prior cultures with ESBL Start meropenam per pharmacy protocol.   Hypertension- BP lower side.  Will hold antihypertensives.  Chronic anemia-multifactorial in the setting of CKD and iron deficiency Hgb stable Continue iron supplementation and folic acid    GERD--- continue Protonix    CKD stage - 3B  Kidney function stable.  Continue to monitor.  Patient is DNR, palliative medicine consulted for goals of care discussion.    Out of bed to chair. Incentive spirometry. Nursing supportive care. Fall, aspiration precautions. Diet:  Diet Orders (From admission, onward)     Start     Ordered   07/07/24 1458  Diet Heart Room service appropriate? Yes; Fluid consistency: Thin  Diet effective now       Question Answer Comment  Room service appropriate? Yes   Fluid consistency: Thin      07/07/24 1458           DVT prophylaxis: SCDs Start: 07/07/24 1458 Place TED hose Start: 07/07/24 1458  Level of care: Telemetry   Code Status:  Limited: Do not attempt resuscitation (DNR) -DNR-LIMITED -Do Not Intubate/DNI   Subjective: Patient is seen and examined today morning.  She is weak, lethargic, unable to provide any history.  Eating poor.  Physical Exam: Vitals:   07/08/24 0400 07/08/24 0837 07/08/24 1403 07/08/24 1428  BP: 123/73  114/68   Pulse: 94     Resp: 14     Temp: 97.7 F (36.5 C)  98.3 F (36.8 C)   TempSrc: Axillary  Oral   SpO2: 97% 100%  100%  Weight:      Height:        General - Elderly elderly Caucasian weak female, no apparent distress HEENT - PERRLA, EOMI, atraumatic head, non tender sinuses. Lung - Clear, diffuse rales, no rhonchi, wheezes. Heart - S1, S2 heard, no murmurs, rubs, 1+ extremity edema. Abdomen - Soft, non tender, bowel sounds good Neuro -lethargic, unable to follow commands, non focal exam. Skin - Warm and dry.  Data Reviewed:      Latest Ref Rng & Units 07/07/2024   11:01 AM 06/14/2024    5:13 AM 06/13/2024    5:06 AM  CBC  WBC 4.0 - 10.5 K/uL 10.3  11.0  7.6   Hemoglobin 12.0 - 15.0 g/dL 9.5  89.9  89.9   Hematocrit 36.0 - 46.0 % 30.1  30.3  30.2   Platelets 150 - 400 K/uL 570  513  468       Latest Ref Rng & Units 07/07/2024   11:01 AM 06/14/2024    5:13 AM 06/13/2024    5:06 AM  BMP  Glucose 70 - 99 mg/dL 98  87  87   BUN 8 - 23 mg/dL 10  11  10    Creatinine 0.44 - 1.00 mg/dL 8.39  8.46  8.35   Sodium 135 - 145 mmol/L 141  134  133   Potassium 3.5 - 5.1 mmol/L 3.9  3.2  3.3   Chloride 98 - 111 mmol/L 107  106  103   CO2 22 - 32 mmol/L 21  20  21    Calcium  8.9 - 10.3 mg/dL 7.3  7.1  7.3    DG Chest Port 1 View Result Date: 07/07/2024 EXAM: 1 VIEW(S) XRAY OF THE CHEST 07/07/2024 11:25:00 AM COMPARISON: 06/07/2024 CLINICAL HISTORY: Questionable sepsis; evaluate for abnormality. FINDINGS: LINES, TUBES AND DEVICES: Originating from the left upper extremity is what appears to be a PICC line which courses towards the midline of the chest in the projection of the left apex,  terminating along the midline of the superior mediastinum. LUNGS AND PLEURA: Low lung volumes. Linear atelectasis or scarring in medial right base. No pleural effusion. No pneumothorax. Clinical correlation is recommended. HEART AND MEDIASTINUM: No acute abnormality of the cardiac and mediastinal silhouettes. BONES AND SOFT TISSUES: No acute osseous abnormality. IMPRESSION: 1. No focal airspace consolidation identified; linear opacity at the medial right lung base most consistent with subsegmental atelectasis versus scarring. 2. Left upper extremity PICC terminates at the superior mediastinum. The tip is presumed to be within the left brachiocephalic vein. Electronically signed by: Waddell Calk MD 07/07/2024 12:22 PM EST RP Workstation: [id number]    Family Communication: No family at bedside  Disposition: Status is: Inpatient Remains inpatient appropriate because: Viral illness, dehydration, poor oral intake. planned Discharge Destination: Skilled nursing facility     Time spent: 46 minutes  Author: Concepcion Riser, MD 07/08/2024 3:51 PM Secure chat 7am to 7pm For on call review [email or web address]    "

## 2024-07-08 NOTE — Plan of Care (Signed)

## 2024-07-08 NOTE — Progress Notes (Signed)
 Patient LTC Resident of [facility] since 2017, was placed in OBS for Influenza A after presenting with AMS and new O2 requirement, currently on O2 2L [place], Inpatient Care Management (ICM) conducted chart review and contacted / visited with patient by phone / at bedside to complete brief assessment.   CM forwarded referral to CV and granted SNF chart access to follow along.  CM team will arrange ambulance transport back to facility once patient is medically ready to discharge.  IPCM team will continue to follow along and monitor patient advancement through interdisciplinary progression rounds. If  any additional patient transition needs arise, please place a TOC consult to prompt IPCM follow-up.    07/08/24 0900  TOC Brief Assessment  Insurance and Status Reviewed  Patient has primary care physician Yes  Home environment has been reviewed LTC at [facility]  Prior level of function: Non-ambulatory, Wheelchair-bound  Social Drivers of Health Review SDOH reviewed no interventions necessary  Readmission risk has been reviewed Yes  Transition of care needs transition of care needs identified, TOC will continue to follow

## 2024-07-08 NOTE — Plan of Care (Signed)
  Problem: Clinical Measurements: Goal: Ability to maintain clinical measurements within normal limits will improve Outcome: Progressing Goal: Will remain free from infection Outcome: Progressing Goal: Diagnostic test results will improve Outcome: Progressing Goal: Respiratory complications will improve Outcome: Progressing Goal: Cardiovascular complication will be avoided Outcome: Progressing   Problem: Coping: Goal: Level of anxiety will decrease Outcome: Progressing   Problem: Pain Managment: Goal: General experience of comfort will improve and/or be controlled Outcome: Progressing

## 2024-07-09 DIAGNOSIS — N184 Chronic kidney disease, stage 4 (severe): Secondary | ICD-10-CM | POA: Diagnosis not present

## 2024-07-09 DIAGNOSIS — I10 Essential (primary) hypertension: Secondary | ICD-10-CM | POA: Diagnosis not present

## 2024-07-09 DIAGNOSIS — D638 Anemia in other chronic diseases classified elsewhere: Secondary | ICD-10-CM | POA: Diagnosis not present

## 2024-07-09 DIAGNOSIS — J101 Influenza due to other identified influenza virus with other respiratory manifestations: Secondary | ICD-10-CM | POA: Diagnosis not present

## 2024-07-09 LAB — C DIFFICILE QUICK SCREEN W PCR REFLEX
C Diff antigen: NEGATIVE
C Diff interpretation: NOT DETECTED
C Diff toxin: NEGATIVE

## 2024-07-09 MED ORDER — NYSTATIN 100000 UNIT/GM EX POWD
Freq: Two times a day (BID) | CUTANEOUS | Status: DC
  Administered 2024-07-09: 1 via TOPICAL
  Filled 2024-07-09 (×2): qty 15

## 2024-07-09 MED ORDER — MAGIC MOUTHWASH: 5.0000 mL | Freq: Four times a day (QID) | ORAL | Status: DC | PRN

## 2024-07-09 NOTE — Progress Notes (Signed)
 " Progress Note   Patient: Robin Blackburn FMW:[id number] DOB: [birth year] DOA: 07/07/2024     1 DOS: the patient was seen and examined on 07/09/2024   Brief hospital course:  Robin Blackburn is a 79 y.o. 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 with altered mentation, fevers, tachycardia, and new oxygen  requirement and is found to be positive for influenza A.   Assessment and Plan: Sepsis secondary to viral illness, influenza A- Presented with fever, tachycardia, tachypnea. Continue bronchodilators, mucolytics and Tamiflu . Patient is dehydrated in the setting of viral illness. Encourage oral diet, supplements. Echo shows elevated pulmonary pressures. EF normal.  Gram-negative UTI---  Urine cultures grew acenetobacter, Klebsiella. Prior cultures with ESBL Continue meropenam per pharmacy protocol.   Hypertension- BP lower side.  Hold antihypertensives.  Chronic anemia-multifactorial in the setting of CKD and iron deficiency Hgb stable. Continue iron supplementation and folic acid    GERD--- continue Protonix    CKD stage - 3B  Kidney function stable.  Continue to monitor.  Patient is DNR, palliative medicine consulted for goals of care discussion.    Out of bed to chair. Incentive spirometry. Nursing supportive care. Fall, aspiration precautions. Diet:  Diet Orders (From admission, onward)     Start     Ordered   07/07/24 1458  Diet Heart Room service appropriate? Yes; Fluid consistency: Thin  Diet effective now       Question Answer Comment  Room service appropriate? Yes   Fluid consistency: Thin      07/07/24 1458           DVT prophylaxis: SCDs Start: 07/07/24 1458 Place TED hose Start: 07/07/24 1458  Level of care: Telemetry   Code Status:  Limited: Do not attempt resuscitation (DNR) -DNR-LIMITED -Do Not Intubate/DNI   Subjective: Patient is seen and examined today morning.  She is weak, lethargic, sleeping. RN noted she ate 25% breakfast.  Physical Exam: Vitals:   07/09/24 0045 07/09/24 0500 07/09/24 0839 07/09/24 0918  BP: (!) 107/53 (!) 102/54 119/65   Pulse: 74 64 65   Resp: 16 18    Temp: 98.8 F (37.1 C) 97.6 F (36.4 C)    TempSrc: Oral Oral    SpO2: 96% 96%  93%  Weight:      Height:        General - Elderly elderly Caucasian weak female, no apparent distress HEENT - PERRLA, EOMI, atraumatic head, non tender sinuses. Lung - Clear, diffuse rales, no rhonchi, wheezes. Heart - S1, S2 heard, no murmurs, rubs, 1+ extremity edema. Abdomen - Soft, non tender, bowel sounds good Neuro -sleeping, non focal exam. Skin - Warm and dry.  Data Reviewed:      Latest Ref Rng & Units 07/07/2024   11:01 AM 06/14/2024    5:13 AM 06/13/2024    5:06 AM  CBC  WBC 4.0 - 10.5 K/uL 10.3  11.0  7.6   Hemoglobin 12.0 - 15.0 g/dL 9.5  89.9  89.9   Hematocrit 36.0 - 46.0 % 30.1  30.3  30.2   Platelets 150 - 400 K/uL 570  513  468       Latest Ref Rng & Units 07/07/2024   11:01 AM 06/14/2024    5:13 AM 06/13/2024    5:06 AM  BMP  Glucose 70 - 99 mg/dL 98  87  87   BUN 8 - 23 mg/dL 10  11  10    Creatinine 0.44 - 1.00 mg/dL 8.39  8.46  8.35   Sodium 135 - 145 mmol/L 141  134  133   Potassium 3.5 - 5.1 mmol/L 3.9  3.2  3.3   Chloride 98 - 111 mmol/L 107  106  103   CO2 22 - 32 mmol/L 21  20  21    Calcium  8.9 - 10.3 mg/dL 7.3  7.1  7.3    US  Abdomen Limited RUQ (LIVER/GB) Result Date: 07/08/2024 CLINICAL DATA:  History of prior cholecystectomy presenting with elevated liver function tests. EXAM: ULTRASOUND ABDOMEN LIMITED RIGHT UPPER QUADRANT COMPARISON:  None Available. FINDINGS: Gallbladder: The gallbladder is surgically absent. Common bile duct: Diameter: 5.5 mm Liver: No focal lesion identified. There is diffusely increased  echogenicity of the liver parenchyma. Portal vein is patent on color Doppler imaging with normal direction of blood flow towards the liver. Other: Of incidental note is the presence of a very small right pleural effusion. IMPRESSION: 1. Findings consistent with history of prior cholecystectomy. 2. Hepatic steatosis. 3. Very small right pleural effusion. Electronically Signed   By: Suzen Dials M.D.   On: 07/08/2024 18:41   ECHOCARDIOGRAM COMPLETE Result Date: 07/08/2024    ECHOCARDIOGRAM REPORT   Patient Name:   Robin Blackburn Kise Date of Exam: 07/08/2024 Medical Rec #:  [id number]     Height:       66.0 in Accession #:    [id number]    Weight:       149.9 lb Date of Birth:  1945/10/23      BSA:          1.769 m Patient Age:    79 years      BP:           114/68 mmHg Patient Gender: F             HR:           89 bpm. Exam Location:  Inpatient Procedure: 2D Echo, Cardiac Doppler and Color Doppler (Both Spectral and Color            Flow Doppler were utilized during procedure). Indications:    Dyspnea R06.00  History:        Patient has no prior history of Echocardiogram examinations.                 Risk Factors:Hypertension.  Sonographer:    Sydnee Wilson RDCS Referring Phys: [id number] CONCEPCION RISER IMPRESSIONS  1. Left ventricular ejection fraction, by estimation, is 60 to 65%. The left ventricle has normal function. The left ventricle has no regional wall motion abnormalities. Left ventricular diastolic parameters are indeterminate.  2. Right ventricular systolic function is normal. The right ventricular size is normal. There is mildly elevated pulmonary artery systolic pressure.  3. The mitral valve is abnormal. Mild mitral valve regurgitation. No evidence of mitral stenosis.  4. The tricuspid valve is abnormal.  5. The aortic valve is tricuspid. Aortic valve regurgitation is moderate. No aortic stenosis is present.  6. The pulmonic valve was abnormal.  7. The inferior vena cava is normal in size with  greater than 50% respiratory variability, suggesting right atrial pressure of 3 mmHg. FINDINGS  Left Ventricle: Left ventricular ejection fraction, by estimation, is 60 to 65%. The left ventricle has normal function. The left ventricle has no regional wall motion abnormalities. The left ventricular internal cavity size was normal  in size. There is  no left ventricular hypertrophy. Left ventricular diastolic parameters are indeterminate. Right Ventricle: The right ventricular size is normal. Right vetricular wall thickness was not well visualized. Right ventricular systolic function is normal. There is mildly elevated pulmonary artery systolic pressure. The tricuspid regurgitant velocity  is 2.89 m/s, and with an assumed right atrial pressure of 3 mmHg, the estimated right ventricular systolic pressure is 36.4 mmHg. Left Atrium: Left atrial size was normal in size. Right Atrium: Right atrial size was normal in size. Pericardium: There is no evidence of pericardial effusion. Mitral Valve: The mitral valve is abnormal. Mild mitral valve regurgitation. No evidence of mitral valve stenosis. Tricuspid Valve: The tricuspid valve is abnormal. Tricuspid valve regurgitation is mild . No evidence of tricuspid stenosis. Aortic Valve: The aortic valve is tricuspid. Aortic valve regurgitation is moderate. Aortic regurgitation PHT measures 329 msec. No aortic stenosis is present. Aortic valve mean gradient measures 4.9 mmHg. Aortic valve peak gradient measures 11.7 mmHg. Aortic valve area, by VTI measures 3.40 cm. Pulmonic Valve: The pulmonic valve was abnormal. Pulmonic valve regurgitation is mild. No evidence of pulmonic stenosis. Aorta: The aortic root and ascending aorta are structurally normal, with no evidence of dilitation. Venous: The inferior vena cava is normal in size with greater than 50% respiratory variability, suggesting right atrial pressure of 3 mmHg. IAS/Shunts: No atrial level shunt detected by color flow  Doppler.  LEFT VENTRICLE PLAX 2D LVIDd:         4.40 cm     Diastology LVIDs:         3.00 cm     LV e' medial:    6.85 cm/s LV PW:         0.90 cm     LV E/e' medial:  7.8 LV IVS:        0.90 cm     LV e' lateral:   8.92 cm/s LVOT diam:     2.00 cm     LV E/e' lateral: 6.0 LV SV:         93 LV SV Index:   52 LVOT Area:     3.14 cm  LV Volumes (MOD) LV vol d, MOD A2C: 72.0 ml LV vol d, MOD A4C: 53.6 ml LV vol s, MOD A2C: 24.1 ml LV vol s, MOD A4C: 19.9 ml LV SV MOD A2C:     47.9 ml LV SV MOD A4C:     53.6 ml LV SV MOD BP:      40.9 ml RIGHT VENTRICLE RV S prime:     13.20 cm/s TAPSE (M-mode): 1.8 cm LEFT ATRIUM             Index        RIGHT ATRIUM           Index LA diam:        2.90 cm 1.64 cm/m   RA Area:     11.20 cm LA Vol (A2C):   16.2 ml 9.16 ml/m   RA Volume:   19.50 ml  11.02 ml/m LA Vol (A4C):   22.5 ml 12.72 ml/m LA Biplane Vol: 19.1 ml 10.80 ml/m  AORTIC VALVE AV Area (Vmax):    2.42 cm AV Area (Vmean):   2.70 cm AV Area (VTI):     3.40 cm AV Vmax:           171.11 cm/s AV Vmean:          101.981 cm/s AV VTI:  0.273 m AV Peak Grad:      11.7 mmHg AV Mean Grad:      4.9 mmHg LVOT Vmax:         132.00 cm/s LVOT Vmean:        87.700 cm/s LVOT VTI:          0.295 m LVOT/AV VTI ratio: 1.08 AI PHT:            329 msec  AORTA Ao Root diam: 2.60 cm Ao Asc diam:  2.50 cm MITRAL VALVE               TRICUSPID VALVE MV Area (PHT): 3.51 cm    TR Peak grad:   33.4 mmHg MV Decel Time: 216 msec    TR Vmax:        289.00 cm/s MV E velocity: 53.40 cm/s MV A velocity: 73.10 cm/s  SHUNTS MV E/A ratio:  0.73        Systemic VTI:  0.30 m                            Systemic Diam: 2.00 cm Dorn Ross MD Electronically signed by Dorn Ross MD Signature Date/Time: 07/08/2024/4:27:57 PM    Final     Family Communication: No family at bedside. Discussed with HCPOA over phone, she understand and agree.  Disposition: Status is: Inpatient Remains inpatient appropriate because: Viral illness,  dehydration, poor oral intake. planned Discharge Destination: Skilled nursing facility     Time spent: 45 minutes  Author: Concepcion Riser, MD 07/09/2024 3:04 PM Secure chat 7am to 7pm For on call review [email or web address]    "

## 2024-07-10 DIAGNOSIS — I10 Essential (primary) hypertension: Secondary | ICD-10-CM | POA: Diagnosis not present

## 2024-07-10 DIAGNOSIS — N184 Chronic kidney disease, stage 4 (severe): Secondary | ICD-10-CM | POA: Diagnosis not present

## 2024-07-10 DIAGNOSIS — D638 Anemia in other chronic diseases classified elsewhere: Secondary | ICD-10-CM | POA: Diagnosis not present

## 2024-07-10 DIAGNOSIS — J101 Influenza due to other identified influenza virus with other respiratory manifestations: Secondary | ICD-10-CM | POA: Diagnosis not present

## 2024-07-10 LAB — URINE CULTURE: Culture: >=100000 — AB

## 2024-07-10 LAB — CARBAPENEM RESISTANCE PANEL
Carba Resistance IMP Gene: NOT DETECTED
Carba Resistance KPC Gene: NOT DETECTED
Carba Resistance NDM Gene: NOT DETECTED
Carba Resistance OXA48 Gene: NOT DETECTED
Carba Resistance VIM Gene: NOT DETECTED

## 2024-07-10 MED ORDER — GABAPENTIN 100 MG PO CAPS
200.0000 mg | ORAL_CAPSULE | Freq: Three times a day (TID) | ORAL | Status: DC
  Administered 2024-07-10 – 2024-07-12 (×6): 200 mg via ORAL
  Filled 2024-07-10 (×6): qty 2

## 2024-07-10 NOTE — Plan of Care (Signed)

## 2024-07-10 NOTE — Progress Notes (Signed)
 " Progress Note   Patient: Robin Blackburn FMW:[id number] DOB: 23-Oct-1945 DOA: 07/07/2024     2 DOS: the patient was seen and examined on 07/10/2024   Brief hospital course:  DARRA ROSA is a 79 y.o. 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   Assessment and Plan: Sepsis secondary to viral illness, influenza A- Presented with fever, tachycardia, tachypnea. Now afebrile, on 2-3L supplemental o2. Continue bronchodilators, mucolytics and Tamiflu . Encourage oral diet, supplements. Echo shows elevated pulmonary pressures. EF normal. Discussed with HCPOA - plan to discharge her back to SNF with palliative care follow up.  Gram-negative UTI---  Urine cultures grew MDR acinetobacter, Klebsiella. Prior cultures with ESBL Discussed with ID, no need to treat with antibiotics as this could be contamination. She has no fever, WBC normal.    Hypertension- BP lower side.  Hold antihypertensives.  Chronic anemia-multifactorial in the setting of CKD and iron deficiency Hgb stable. Continue iron supplementation and folic acid    GERD--- continue Protonix    CKD stage - 3B  Kidney function stable.   Patient is DNR, palliative medicine consulted for goals of care discussion.    Out of bed to chair. Incentive spirometry. Nursing supportive care. Fall, aspiration precautions. Diet:  Diet Orders (From admission, onward)     Start     Ordered   07/07/24 1458  Diet Heart Room service appropriate? Yes; Fluid consistency: Thin  Diet effective now       Question Answer Comment  Room service appropriate? Yes   Fluid consistency: Thin      07/07/24 1458            DVT prophylaxis: SCDs Start: 07/07/24 1458 Place TED hose Start: 07/07/24 1458  Level of care: Telemetry   Code Status: Limited: Do not attempt resuscitation (DNR) -DNR-LIMITED -Do Not Intubate/DNI   Subjective: Patient is seen and examined today morning.  She is weak, lethargic, opens eyes. No family at bedside. No overnight issues.  Physical Exam: Vitals:   07/09/24 2041 07/10/24 0458 07/10/24 0847 07/10/24 1239  BP:  118/83  104/78  Pulse:  77  82  Resp:    16  Temp:  98.4 F (36.9 C)  98.3 F (36.8 C)  TempSrc:  Oral  Oral  SpO2: 97% 99% 97% 99%  Weight:      Height:        General - Elderly elderly Caucasian weak female, no apparent distress HEENT - PERRLA, EOMI, atraumatic head, non tender sinuses. Lung - Clear, diffuse rales, no rhonchi, wheezes. Heart - S1, S2 heard, no murmurs, rubs, 1+ extremity edema. Abdomen - Soft, non tender, bowel sounds good Neuro -sleeping, non focal exam. Skin - Warm and dry.  Data Reviewed:      Latest Ref Rng & Units 07/07/2024   11:01 AM 06/14/2024    5:13 AM 06/13/2024    5:06 AM  CBC  WBC 4.0 - 10.5 K/uL 10.3  11.0  7.6   Hemoglobin 12.0 - 15.0 g/dL 9.5  89.9  89.9   Hematocrit 36.0 - 46.0 % 30.1  30.3  30.2   Platelets 150 - 400 K/uL 570  513  468       Latest Ref Rng & Units  07/07/2024   11:01 AM 06/14/2024    5:13 AM 06/13/2024    5:06 AM  BMP  Glucose 70 - 99 mg/dL 98  87  87   BUN 8 - 23 mg/dL 10  11  10    Creatinine 0.44 - 1.00 mg/dL 8.39  8.46  8.35   Sodium 135 - 145 mmol/L 141  134  133   Potassium 3.5 - 5.1 mmol/L 3.9  3.2  3.3   Chloride 98 - 111 mmol/L 107  106  103   CO2 22 - 32 mmol/L 21  20  21    Calcium  8.9 - 10.3 mg/dL 7.3  7.1  7.3    No results found.   Family Communication:  Discussed with HCPOA over phone, she understand and agree.  Disposition: Status is: Inpatient Remains inpatient appropriate because: Viral illness, dehydration, poor oral intake. planned Discharge Destination: Skilled nursing  facility     Time spent: 43 minutes  Author: Concepcion Riser, MD 07/10/2024 3:06 PM Secure chat 7am to 7pm For on call review [email or web address]    "

## 2024-07-11 DIAGNOSIS — I10 Essential (primary) hypertension: Secondary | ICD-10-CM | POA: Diagnosis not present

## 2024-07-11 DIAGNOSIS — D638 Anemia in other chronic diseases classified elsewhere: Secondary | ICD-10-CM | POA: Diagnosis not present

## 2024-07-11 DIAGNOSIS — N184 Chronic kidney disease, stage 4 (severe): Secondary | ICD-10-CM | POA: Diagnosis not present

## 2024-07-11 DIAGNOSIS — J101 Influenza due to other identified influenza virus with other respiratory manifestations: Secondary | ICD-10-CM | POA: Diagnosis not present

## 2024-07-11 LAB — CBC
HCT: 29.1 % — ABNORMAL LOW (ref 36.0–46.0)
Hemoglobin: 9.1 g/dL — ABNORMAL LOW (ref 12.0–15.0)
MCH: 30.8 pg (ref 26.0–34.0)
MCHC: 31.3 g/dL (ref 30.0–36.0)
MCV: 98.6 fL (ref 80.0–100.0)
Platelets: 295 10*3/uL (ref 150–400)
RBC: 2.95 MIL/uL — ABNORMAL LOW (ref 3.87–5.11)
RDW: 16.7 % — ABNORMAL HIGH (ref 11.5–15.5)
WBC: 4.6 10*3/uL (ref 4.0–10.5)
nRBC: 0 % (ref 0.0–0.2)

## 2024-07-11 LAB — COMPREHENSIVE METABOLIC PANEL WITH GFR
ALT: 18 U/L (ref 0–44)
AST: 67 U/L — ABNORMAL HIGH (ref 15–41)
Albumin: 1.5 g/dL — ABNORMAL LOW (ref 3.5–5.0)
Alkaline Phosphatase: 234 U/L — ABNORMAL HIGH (ref 38–126)
Anion gap: 11 (ref 5–15)
BUN: 10 mg/dL (ref 8–23)
CO2: 19 mmol/L — ABNORMAL LOW (ref 22–32)
Calcium: 6.8 mg/dL — ABNORMAL LOW (ref 8.9–10.3)
Chloride: 107 mmol/L (ref 98–111)
Creatinine, Ser: 1.4 mg/dL — ABNORMAL HIGH (ref 0.44–1.00)
GFR, Estimated: 38 mL/min — ABNORMAL LOW
Glucose, Bld: 86 mg/dL (ref 70–99)
Potassium: 3.1 mmol/L — ABNORMAL LOW (ref 3.5–5.1)
Sodium: 137 mmol/L (ref 135–145)
Total Bilirubin: 0.3 mg/dL (ref 0.0–1.2)
Total Protein: 4.7 g/dL — ABNORMAL LOW (ref 6.5–8.1)

## 2024-07-11 MED ORDER — ALBUMIN HUMAN 25 % IV SOLN
12.5000 g | Freq: Once | INTRAVENOUS | Status: AC
  Administered 2024-07-11: 12.5 g via INTRAVENOUS
  Filled 2024-07-11: qty 50

## 2024-07-11 MED ORDER — IPRATROPIUM-ALBUTEROL 0.5-2.5 (3) MG/3ML IN SOLN
3.0000 mL | Freq: Two times a day (BID) | RESPIRATORY_TRACT | Status: DC
  Administered 2024-07-11 – 2024-07-12 (×2): 3 mL via RESPIRATORY_TRACT
  Filled 2024-07-11 (×2): qty 3

## 2024-07-11 MED ORDER — TRAMADOL HCL 50 MG PO TABS: 50.0000 mg | ORAL_TABLET | Freq: Two times a day (BID) | ORAL | Status: DC | PRN

## 2024-07-11 NOTE — Plan of Care (Signed)

## 2024-07-11 NOTE — TOC Progression Note (Signed)
 Transition of Care [facility]) - Progression Note    Patient Details  Name: Robin Blackburn MRN: [id number] Date of Birth: 1946-01-28  [facility] of [facility]) CM/SW Contact  Sharlyne Stabs, RN Phone Number: 07/11/2024, 5:09 PM  Clinical Narrative:   Palliative consulted. Patient is LTC at [facility], requesting therapy due to weakness. This is reasonable request per Palliative. PT ordered.  IPCM following to start [employer] Auth. Patient is active with [facility].      Social Drivers of Health (SDOH) Interventions SDOH Screenings   Food Insecurity: No Food Insecurity (07/08/2024)  Housing: Low Risk (07/08/2024)  Transportation Needs: No Transportation Needs (07/08/2024)  Utilities: Not At Risk (07/08/2024)  Alcohol  Screen: Low Risk (01/06/2022)  Depression (PHQ2-9): Low Risk (02/25/2024)  Financial Resource Strain: Low Risk (01/06/2022)  Physical Activity: Inactive (11/07/2022)   [facility] from [facility]  Social Connections: Unknown (07/08/2024)  Stress: No Stress Concern Present (01/06/2022)  Tobacco Use: Low Risk (07/07/2024)    Readmission Risk Interventions    03/29/2024    9:23 AM 03/11/2024    8:52 AM 01/09/2024   11:45 AM  Readmission Risk Prevention Plan  Transportation Screening Complete Complete Complete  Home Care Screening   Complete  Medication Review (RN CM)   Complete  Medication Review Oceanographer) Complete Complete   HRI or Home Care Consult Complete Complete   SW Recovery Care/Counseling Consult Complete Complete   Palliative Care Screening Not Applicable Not Applicable   Skilled Nursing Facility Complete Complete

## 2024-07-11 NOTE — Progress Notes (Signed)
" °                                                                                                     °                                                   °  Daily Progress Note   Patient Name: Robin Blackburn       Date: 07/11/2024 DOB: February 19, 1946  Age: 79 y.o. MRN#: [id number] Attending Physician: Darci Pore, MD Primary Care Physician: Randy Eva, NP Admit Date: 07/07/2024  Reason for Initial Consultation/Follow-up: {Reason for Consult:23484}  Subjective: ***  Today, ***  Chart review/care coordination:  Completed extensive chart review including EPIC notes, ***. Coordinated care with ****.    Length of Stay: 3   Physical Exam          Vital Signs: BP (!) 116/54 (BP Location: Left Arm)   Pulse 77   Temp 99 F (37.2 C) (Axillary)   Resp 14   Ht 5' 6 (1.676 m)   Wt 68 kg   SpO2 93%   BMI 24.20 kg/m  SpO2: SpO2: 93 % O2 Device: O2 Device: Room Air (found with cannula in nose but not connected. Patient sats 93% on room air) O2 Flow Rate: O2 Flow Rate (L/min): 1.5 L/min      Palliative Assessment/Data:   Palliative Care Assessment & Plan   Patient Profile/Assessment:  ***   Recommendations/Plan: Continue DNR/DNI- confirmed Treat the treatable/time for outcomes Continue to follow up with palliative as an outpatient (already established) Therapy consult to determine rehab potential and any skilled therapy needs    Symptom management:  ***  Prognosis:  {Palliative Care Prognosis:23504}    Discharge Planning: {Palliative dispostion:23505}    Detailed review of medical records (labs, imaging, vital signs), medically appropriate exam, discussed with treatment team, counseling and education to patient, family, & staff, documenting clinical information, medication management, coordination of care   Total time:  I personally spent a total of *** minutes in the care of the patient today including {Time Based Coding:210964241}.    Billing based on  MDM: ***  {Problems Addressed:304933}  {Amount and/or Complexity of Ijuj:695065}  {Risks:304936}         Laymon CHRISTELLA Pinal, NP  Palliative Medicine Team Team phone # [phone]  Thank you for allowing the Palliative Medicine Team to assist in the care of this patient. Please utilize secure chat with additional questions, if there is no response within 30 minutes please call the above phone number.  Palliative Medicine Team providers are available by phone from 7am to 7pm daily and can be reached through the team cell phone.  Should this patient require assistance outside of these hours, please call the patient's attending physician.   "

## 2024-07-11 NOTE — Plan of Care (Signed)

## 2024-07-12 DIAGNOSIS — N184 Chronic kidney disease, stage 4 (severe): Secondary | ICD-10-CM | POA: Diagnosis not present

## 2024-07-12 DIAGNOSIS — N390 Urinary tract infection, site not specified: Secondary | ICD-10-CM

## 2024-07-12 DIAGNOSIS — D638 Anemia in other chronic diseases classified elsewhere: Secondary | ICD-10-CM | POA: Diagnosis not present

## 2024-07-12 DIAGNOSIS — R531 Weakness: Secondary | ICD-10-CM | POA: Diagnosis not present

## 2024-07-12 DIAGNOSIS — Z8673 Personal history of transient ischemic attack (TIA), and cerebral infarction without residual deficits: Secondary | ICD-10-CM

## 2024-07-12 DIAGNOSIS — A419 Sepsis, unspecified organism: Secondary | ICD-10-CM | POA: Diagnosis not present

## 2024-07-12 DIAGNOSIS — I10 Essential (primary) hypertension: Secondary | ICD-10-CM | POA: Diagnosis not present

## 2024-07-12 DIAGNOSIS — R5381 Other malaise: Secondary | ICD-10-CM | POA: Diagnosis not present

## 2024-07-12 DIAGNOSIS — J101 Influenza due to other identified influenza virus with other respiratory manifestations: Secondary | ICD-10-CM | POA: Diagnosis not present

## 2024-07-12 DIAGNOSIS — R627 Adult failure to thrive: Secondary | ICD-10-CM | POA: Diagnosis not present

## 2024-07-12 LAB — CULTURE, BLOOD (ROUTINE X 2)
Culture: NO GROWTH
Culture: NO GROWTH

## 2024-07-12 MED ORDER — TRAZODONE HCL 100 MG PO TABS: 100.0000 mg | ORAL_TABLET | Freq: Every evening | ORAL | 0 refills | Status: AC | PRN

## 2024-07-12 MED ORDER — FERROUS SULFATE 325 (65 FE) MG PO TABS: 325.0000 mg | ORAL_TABLET | Freq: Every day | ORAL | 1 refills | Status: AC

## 2024-07-12 MED ORDER — FOLIC ACID 1 MG PO TABS: 1.0000 mg | ORAL_TABLET | Freq: Every morning | ORAL | 1 refills | Status: AC

## 2024-07-12 MED ORDER — ALPRAZOLAM 0.5 MG PO TABS: 0.5000 mg | ORAL_TABLET | Freq: Two times a day (BID) | ORAL | 0 refills | Status: AC | PRN

## 2024-07-12 NOTE — Plan of Care (Signed)

## 2024-07-12 NOTE — TOC Transition Note (Signed)
 Transition of Care [facility]) - Discharge Note   Patient Details  Name: Robin Blackburn MRN: [id number] Date of Birth: 1945/06/30  Transition of Care [facility]) CM/SW Contact:  Hoy DELENA Bigness, LCSW Phone Number: 07/12/2024, 12:08 PM   Clinical Narrative:    Pt to return to [facility] room B2-1. RN to call report to [phone]. Spoke w' pt's friend/HCPOA to inform of discharge. Medicall necessity form has been printed to the unit. [facility] called at 12:12pm for transport.    Final next level of care: Skilled Nursing Facility Barriers to Discharge: Barriers Resolved   Patient Goals and CMS Choice Patient states their goals for this hospitalization and ongoing recovery are:: To return to [email or web address] Compare Post Acute Care list provided to:: Patient Represenative (must comment) Choice offered to / list presented to : [facility] POA / Guardian      Discharge Placement   Existing PASRR number confirmed : 07/12/24          Patient chooses bed at: Other - please specify in the comment section below: [facility]) Patient to be transferred to facility by: [facility] Name of family member notified: Cherylin Jenny Patient and family notified of of transfer: 07/12/24  Discharge Plan and Services Additional resources added to the After Visit Summary for                  DME Arranged: N/A DME Agency: NA                  Social Drivers of Health (SDOH) Interventions SDOH Screenings   Food Insecurity: No Food Insecurity (07/08/2024)  Housing: Low Risk (07/08/2024)  Transportation Needs: No Transportation Needs (07/08/2024)  Utilities: Not At Risk (07/08/2024)  Alcohol  Screen: Low Risk (01/06/2022)  Depression (PHQ2-9): Low Risk (02/25/2024)  Financial Resource Strain: Low Risk (01/06/2022)  Physical Activity: Inactive (11/07/2022)   Received from [facility]  Social Connections: Unknown (07/08/2024)  Stress: No Stress Concern Present (01/06/2022)  Tobacco Use:  Low Risk (07/07/2024)     Readmission Risk Interventions    07/12/2024   12:06 PM 03/29/2024    9:23 AM 03/11/2024    8:52 AM  Readmission Risk Prevention Plan  Transportation Screening  Complete Complete  Medication Review Oceanographer) Complete Complete Complete  PCP or Specialist appointment within 3-5 days of discharge Complete    HRI or Home Care Consult Complete Complete Complete  SW Recovery Care/Counseling Consult Complete Complete Complete  Palliative Care Screening Not Applicable Not Applicable Not Applicable  Skilled Nursing Facility Complete Complete Complete

## 2024-07-12 NOTE — Plan of Care (Signed)
" °  Problem: Acute Rehab PT Goals(only PT should resolve) Goal: Pt Will Go Supine/Side To Sit Outcome: Progressing Flowsheets (Taken 07/12/2024 1141) Pt will go Supine/Side to Sit: with moderate assist Goal: Patient Will Perform Sitting Balance Outcome: Progressing Flowsheets (Taken 07/12/2024 1141) Patient will perform sitting balance: with contact guard assist Goal: Patient Will Transfer Sit To/From Stand Outcome: Progressing Flowsheets (Taken 07/12/2024 1141) Patient will transfer sit to/from stand: with moderate assist Goal: Pt Will Transfer Bed To Chair/Chair To Bed Outcome: Progressing Flowsheets (Taken 07/12/2024 1141) Pt will Transfer Bed to Chair/Chair to Bed: with mod assist    11:42 AM, 07/12/24 Rosaria Settler, PT, DPT [place] with [facility]  "

## 2024-07-15 ENCOUNTER — Inpatient Hospital Stay (HOSPITAL_COMMUNITY): Admission: EM | Admit: 2024-07-15 | Source: Skilled Nursing Facility | Admitting: Family Medicine

## 2024-07-15 ENCOUNTER — Emergency Department (HOSPITAL_COMMUNITY)

## 2024-07-15 ENCOUNTER — Other Ambulatory Visit: Payer: Self-pay

## 2024-07-15 ENCOUNTER — Encounter (HOSPITAL_COMMUNITY): Payer: Self-pay | Admitting: Emergency Medicine

## 2024-07-15 DIAGNOSIS — A419 Sepsis, unspecified organism: Principal | ICD-10-CM | POA: Diagnosis present

## 2024-07-15 DIAGNOSIS — J9601 Acute respiratory failure with hypoxia: Secondary | ICD-10-CM | POA: Diagnosis present

## 2024-07-15 DIAGNOSIS — A498 Other bacterial infections of unspecified site: Secondary | ICD-10-CM | POA: Diagnosis present

## 2024-07-15 LAB — BLOOD GAS, VENOUS
Acid-base deficit: 2.2 mmol/L — ABNORMAL HIGH (ref 0.0–2.0)
Bicarbonate: 23.2 mmol/L (ref 20.0–28.0)
Drawn by: 7049
O2 Saturation: 98 %
Patient temperature: 36.8
pCO2, Ven: 41 mmHg — ABNORMAL LOW (ref 44–60)
pH, Ven: 7.36 (ref 7.25–7.43)
pO2, Ven: 60 mmHg — ABNORMAL HIGH (ref 32–45)

## 2024-07-15 LAB — COMPREHENSIVE METABOLIC PANEL WITH GFR
ALT: 6 U/L (ref 0–44)
AST: 17 U/L (ref 15–41)
Albumin: <1.5 g/dL — ABNORMAL LOW (ref 3.5–5.0)
Alkaline Phosphatase: 144 U/L — ABNORMAL HIGH (ref 38–126)
Anion gap: 10 (ref 5–15)
BUN: 10 mg/dL (ref 8–23)
CO2: 18 mmol/L — ABNORMAL LOW (ref 22–32)
Calcium: 6.1 mg/dL — CL (ref 8.9–10.3)
Chloride: 111 mmol/L (ref 98–111)
Creatinine, Ser: 1.33 mg/dL — ABNORMAL HIGH (ref 0.44–1.00)
GFR, Estimated: 40 mL/min — ABNORMAL LOW
Glucose, Bld: 126 mg/dL — ABNORMAL HIGH (ref 70–99)
Potassium: 2.8 mmol/L — ABNORMAL LOW (ref 3.5–5.1)
Sodium: 139 mmol/L (ref 135–145)
Total Bilirubin: 0.3 mg/dL (ref 0.0–1.2)
Total Protein: 3.9 g/dL — ABNORMAL LOW (ref 6.5–8.1)

## 2024-07-15 LAB — URINALYSIS, W/ REFLEX TO CULTURE (INFECTION SUSPECTED)
Bilirubin Urine: NEGATIVE
Glucose, UA: NEGATIVE mg/dL
Ketones, ur: NEGATIVE mg/dL
Nitrite: NEGATIVE
Protein, ur: 100 mg/dL — AB
RBC / HPF: >50 RBC/hpf (ref 0–5)
Specific Gravity, Urine: 1.015 (ref 1.005–1.030)
WBC, UA: >50 WBC/hpf (ref 0–5)
pH: 5 (ref 5.0–8.0)

## 2024-07-15 LAB — CBC WITH DIFFERENTIAL/PLATELET
Abs Immature Granulocytes: 0.15 10*3/uL — ABNORMAL HIGH (ref 0.00–0.07)
Basophils Absolute: 0 10*3/uL (ref 0.0–0.1)
Basophils Relative: 0 %
Eosinophils Absolute: 0 10*3/uL (ref 0.0–0.5)
Eosinophils Relative: 0 %
HCT: 26.9 % — ABNORMAL LOW (ref 36.0–46.0)
Hemoglobin: 8.5 g/dL — ABNORMAL LOW (ref 12.0–15.0)
Immature Granulocytes: 1 %
Lymphocytes Relative: 9 %
Lymphs Abs: 1.3 10*3/uL (ref 0.7–4.0)
MCH: 31 pg (ref 26.0–34.0)
MCHC: 31.6 g/dL (ref 30.0–36.0)
MCV: 98.2 fL (ref 80.0–100.0)
Monocytes Absolute: 0.8 10*3/uL (ref 0.1–1.0)
Monocytes Relative: 6 %
Neutro Abs: 12.9 10*3/uL — ABNORMAL HIGH (ref 1.7–7.7)
Neutrophils Relative %: 84 %
Platelets: 285 10*3/uL (ref 150–400)
RBC: 2.74 MIL/uL — ABNORMAL LOW (ref 3.87–5.11)
RDW: 16.7 % — ABNORMAL HIGH (ref 11.5–15.5)
WBC: 15.3 10*3/uL — ABNORMAL HIGH (ref 4.0–10.5)
nRBC: 0 % (ref 0.0–0.2)

## 2024-07-15 LAB — POTASSIUM
Potassium: 2.5 mmol/L — CL (ref 3.5–5.1)
Potassium: 2.6 mmol/L — CL (ref 3.5–5.1)

## 2024-07-15 LAB — RESP PANEL BY RT-PCR (RSV, FLU A&B, COVID)  RVPGX2
Influenza A by PCR: POSITIVE — AB
Influenza B by PCR: NEGATIVE
Resp Syncytial Virus by PCR: NEGATIVE
SARS Coronavirus 2 by RT PCR: NEGATIVE

## 2024-07-15 LAB — CBG MONITORING, ED: Glucose-Capillary: 125 mg/dL — ABNORMAL HIGH (ref 70–99)

## 2024-07-15 LAB — PHOSPHORUS: Phosphorus: 1.6 mg/dL — ABNORMAL LOW (ref 2.5–4.6)

## 2024-07-15 LAB — MAGNESIUM: Magnesium: 1.5 mg/dL — ABNORMAL LOW (ref 1.7–2.4)

## 2024-07-15 LAB — LACTIC ACID, PLASMA
Lactic Acid, Venous: 3.2 mmol/L (ref 0.5–1.9)
Lactic Acid, Venous: 4 mmol/L (ref 0.5–1.9)
Lactic Acid, Venous: 1.7 mmol/L (ref 0.5–1.9)

## 2024-07-15 LAB — PROTIME-INR
INR: 3.4 — ABNORMAL HIGH (ref 0.8–1.2)
Prothrombin Time: 35.8 s — ABNORMAL HIGH (ref 11.4–15.2)

## 2024-07-15 LAB — MRSA NEXT GEN BY PCR, NASAL: MRSA by PCR Next Gen: NOT DETECTED

## 2024-07-15 LAB — GLUCOSE, CAPILLARY
Glucose-Capillary: 109 mg/dL — ABNORMAL HIGH (ref 70–99)
Glucose-Capillary: 145 mg/dL — ABNORMAL HIGH (ref 70–99)

## 2024-07-15 MED ORDER — SODIUM CHLORIDE 0.9 % IV BOLUS (SEPSIS)
250.0000 mL | Freq: Once | INTRAVENOUS | Status: AC
  Administered 2024-07-15: 250 mL via INTRAVENOUS

## 2024-07-15 MED ORDER — METRONIDAZOLE 500 MG/100ML IV SOLN
500.0000 mg | Freq: Once | INTRAVENOUS | Status: AC
  Administered 2024-07-15: 500 mg via INTRAVENOUS
  Filled 2024-07-15: qty 100

## 2024-07-15 MED ORDER — SODIUM PHOSPHATES 45 MMOLE/15ML IV SOLN
30.0000 mmol | Freq: Once | INTRAVENOUS | Status: AC
  Filled 2024-07-15: qty 10

## 2024-07-15 MED ORDER — ALBUMIN HUMAN 25 % IV SOLN
25.0000 g | Freq: Four times a day (QID) | INTRAVENOUS | Status: AC
  Administered 2024-07-15 (×2): 25 g via INTRAVENOUS
  Filled 2024-07-15 (×2): qty 100

## 2024-07-15 MED ORDER — SODIUM CHLORIDE 0.9 % IV SOLN: INTRAVENOUS | Status: AC | PRN

## 2024-07-15 MED ORDER — SODIUM CHLORIDE 0.9 % IV SOLN
2.0000 g | Freq: Two times a day (BID) | INTRAVENOUS | Status: AC
  Administered 2024-07-15: 2 g via INTRAVENOUS
  Filled 2024-07-15 (×3): qty 40

## 2024-07-15 MED ORDER — ACETAMINOPHEN 325 MG PO TABS: 650.0000 mg | ORAL_TABLET | Freq: Four times a day (QID) | ORAL | Status: AC | PRN

## 2024-07-15 MED ORDER — SODIUM BICARBONATE 650 MG PO TABS
650.0000 mg | ORAL_TABLET | Freq: Three times a day (TID) | ORAL | Status: AC
  Filled 2024-07-15: qty 1

## 2024-07-15 MED ORDER — ALPRAZOLAM 0.5 MG PO TABS: 0.5000 mg | ORAL_TABLET | Freq: Two times a day (BID) | ORAL | Status: AC | PRN

## 2024-07-15 MED ORDER — LIDOCAINE HCL (PF) 1 % IJ SOLN
5.0000 mL | INTRAMUSCULAR | Status: AC
  Administered 2024-07-15: 5 mL

## 2024-07-15 MED ORDER — ACETAMINOPHEN 650 MG RE SUPP
RECTAL | Status: AC
  Filled 2024-07-15: qty 1

## 2024-07-15 MED ORDER — ACETAMINOPHEN 650 MG RE SUPP
650.0000 mg | Freq: Once | RECTAL | Status: AC
  Administered 2024-07-15: 650 mg via RECTAL

## 2024-07-15 MED ORDER — SODIUM CHLORIDE 0.9% FLUSH
3.0000 mL | Freq: Two times a day (BID) | INTRAVENOUS | Status: AC
  Administered 2024-07-15: 3 mL via INTRAVENOUS

## 2024-07-15 MED ORDER — HYDROCORTISONE SOD SUC (PF) 100 MG IJ SOLR: 50.0000 mg | Freq: Four times a day (QID) | INTRAMUSCULAR | Status: AC

## 2024-07-15 MED ORDER — HYDROCORTISONE SOD SUC (PF) 100 MG IJ SOLR
100.0000 mg | Freq: Once | INTRAMUSCULAR | Status: AC
  Administered 2024-07-15: 100 mg via INTRAVENOUS
  Filled 2024-07-15: qty 2

## 2024-07-15 MED ORDER — ACETAMINOPHEN 650 MG RE SUPP
650.0000 mg | Freq: Four times a day (QID) | RECTAL | Status: AC | PRN
  Administered 2024-07-15: 650 mg via RECTAL
  Filled 2024-07-15: qty 1

## 2024-07-15 MED ORDER — ALBUTEROL SULFATE (2.5 MG/3ML) 0.083% IN NEBU: 2.5000 mg | INHALATION_SOLUTION | RESPIRATORY_TRACT | Status: AC | PRN

## 2024-07-15 MED ORDER — SENNA 8.6 MG PO TABS
1.0000 | ORAL_TABLET | Freq: Two times a day (BID) | ORAL | Status: AC
  Filled 2024-07-15: qty 1

## 2024-07-15 MED ORDER — BISACODYL 10 MG RE SUPP: 10.0000 mg | Freq: Every day | RECTAL | Status: AC | PRN

## 2024-07-15 MED ORDER — PANTOPRAZOLE SODIUM 40 MG IV SOLR
40.0000 mg | INTRAVENOUS | Status: AC
  Administered 2024-07-15: 40 mg via INTRAVENOUS
  Filled 2024-07-15: qty 10

## 2024-07-15 MED ORDER — LACTATED RINGERS IV SOLN: INTRAVENOUS | Status: AC

## 2024-07-15 MED ORDER — SODIUM CHLORIDE 0.9 % IV BOLUS (SEPSIS)
1000.0000 mL | Freq: Once | INTRAVENOUS | Status: AC
  Administered 2024-07-15: 1000 mL via INTRAVENOUS

## 2024-07-15 MED ORDER — MINOCYCLINE HCL 50 MG PO CAPS
200.0000 mg | ORAL_CAPSULE | Freq: Two times a day (BID) | ORAL | Status: AC
  Filled 2024-07-15 (×3): qty 4

## 2024-07-15 MED ORDER — CYCLOSPORINE 0.05 % OP EMUL
1.0000 [drp] | Freq: Two times a day (BID) | OPHTHALMIC | Status: AC
  Administered 2024-07-15: 1 [drp] via OPHTHALMIC
  Filled 2024-07-15: qty 30

## 2024-07-15 MED ORDER — MAGNESIUM SULFATE 4 GM/100ML IV SOLN
INTRAVENOUS | Status: AC
  Filled 2024-07-15: qty 100

## 2024-07-15 MED ORDER — VANCOMYCIN HCL 1500 MG/300ML IV SOLN
1500.0000 mg | Freq: Once | INTRAVENOUS | Status: AC
  Administered 2024-07-15: 1500 mg via INTRAVENOUS
  Filled 2024-07-15: qty 300

## 2024-07-15 MED ORDER — NOREPINEPHRINE 4 MG/250ML-% IV SOLN
0.0000 ug/min | INTRAVENOUS | Status: AC
  Administered 2024-07-15: 10 ug/min via INTRAVENOUS
  Administered 2024-07-15: 12 ug/min via INTRAVENOUS
  Filled 2024-07-15 (×3): qty 250

## 2024-07-15 MED ORDER — MAGNESIUM SULFATE 4 GM/100ML IV SOLN
4.0000 g | Freq: Once | INTRAVENOUS | Status: AC
  Administered 2024-07-15: 4 g via INTRAVENOUS
  Filled 2024-07-15: qty 100

## 2024-07-15 MED ORDER — FOLIC ACID 1 MG PO TABS: 1.0000 mg | ORAL_TABLET | Freq: Every morning | ORAL | Status: AC

## 2024-07-15 MED ORDER — LACTATED RINGERS IV SOLN: INTRAVENOUS | Status: DC

## 2024-07-15 MED ORDER — ONDANSETRON HCL 4 MG PO TABS: 4.0000 mg | ORAL_TABLET | Freq: Four times a day (QID) | ORAL | Status: AC | PRN

## 2024-07-15 MED ORDER — POLYETHYLENE GLYCOL 3350 17 G PO PACK: 17.0000 g | PACK | Freq: Every day | ORAL | Status: AC | PRN

## 2024-07-15 MED ORDER — ONDANSETRON HCL 4 MG/2ML IJ SOLN: 4.0000 mg | Freq: Four times a day (QID) | INTRAMUSCULAR | Status: AC | PRN

## 2024-07-15 MED ORDER — HEPARIN SODIUM (PORCINE) 5000 UNIT/ML IJ SOLN: 5000.0000 [IU] | Freq: Three times a day (TID) | INTRAMUSCULAR | Status: DC

## 2024-07-15 MED ORDER — POTASSIUM CHLORIDE 10 MEQ/100ML IV SOLN: 10.0000 meq | INTRAVENOUS | Status: AC

## 2024-07-15 MED ORDER — SODIUM CHLORIDE 0.9% FLUSH: 3.0000 mL | INTRAVENOUS | Status: AC | PRN

## 2024-07-15 MED ORDER — SODIUM CHLORIDE 0.9 % IV SOLN
2.0000 g | Freq: Once | INTRAVENOUS | Status: AC
  Administered 2024-07-15: 2 g via INTRAVENOUS
  Filled 2024-07-15: qty 12.5

## 2024-07-15 MED ORDER — FERROUS SULFATE 325 (65 FE) MG PO TABS: 325.0000 mg | ORAL_TABLET | Freq: Every day | ORAL | Status: AC

## 2024-07-15 MED ORDER — CHLORHEXIDINE GLUCONATE CLOTH 2 % EX PADS: 6.0000 | MEDICATED_PAD | Freq: Every day | CUTANEOUS | Status: AC

## 2024-07-15 MED ORDER — LIDOCAINE-EPINEPHRINE (PF) 2 %-1:200000 IJ SOLN
10.0000 mL | Freq: Once | INTRAMUSCULAR | Status: AC
  Administered 2024-07-15: 10 mL
  Filled 2024-07-15: qty 20

## 2024-07-15 MED ORDER — VANCOMYCIN HCL IN DEXTROSE 1-5 GM/200ML-% IV SOLN: 1000.0000 mg | Freq: Once | INTRAVENOUS | Status: DC

## 2024-07-15 MED ORDER — TRAZODONE HCL 50 MG PO TABS: 100.0000 mg | ORAL_TABLET | Freq: Every evening | ORAL | Status: AC | PRN

## 2024-07-15 MED ORDER — IPRATROPIUM-ALBUTEROL 0.5-2.5 (3) MG/3ML IN SOLN
3.0000 mL | Freq: Four times a day (QID) | RESPIRATORY_TRACT | Status: AC
  Administered 2024-07-15: 3 mL via RESPIRATORY_TRACT
  Filled 2024-07-15: qty 3

## 2024-07-15 MED ORDER — SODIUM BICARBONATE 8.4 % IV SOLN
100.0000 meq | Freq: Once | INTRAVENOUS | Status: AC
  Administered 2024-07-15: 100 meq via INTRAVENOUS
  Filled 2024-07-15: qty 50

## 2024-07-15 NOTE — H&P (Signed)
 " History and Physical    Patient: Robin Blackburn FMW:[id number] DOB: [birth year] DOA: 07/15/2024 DOS: the patient was seen and examined on 07/15/2024 PCP: Randy Eva, NP  Patient coming from: SNF[facility]  Chief Complaint:  Chief Complaint  Patient presents with   Code Sepsis   HPI: CHRISTELLA APP is a 79 y.o. 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   - -- Patient is very sleepy, --not really able to answer questions at this time Mrs Charmel Jenny [facility]) at bedside and providing further information at this time - No emesis, no diarrhea --Palliative care provider also he is speaking with her HCPOA at bedside --In the ED influenza A is positive (previously positive during last admission), RSV and COVID-negative -- Chest x-ray suspicious for right-sided pneumonia -- WBC 15.3 it was 4.6 on 07/11/2024,  Lactic acid 3.2 >>4.0 >>1.7 -Hgb 8.5 , recent baseline usually between 9 and 10 (did require PRBC transfusion in January 2026) - UA from nephrostomy tube suggestive of UTI --- recent urine culture from 07/07/2024 grew---->=100,000 COLONIES/mL ACINETOBACTER CALCOACETICUS/BAUMANNII COMPLEX >=100,000 COLONIES/mL KLEBSIELLA PNEUMONIAE ACINETOBACTER CALCOACETICUS/BAUMANNII COMPLEX MULTI-DRUG RESISTANT ORGANISM  Calcium  is 6.1 but albumin  is less than 1.5 so corrected calcium  is probably normal --Creatinine is 1.33 which is actually slightly lower than recent levels which were between 1.4 and  1.6 -Alk phos is elevated but lower than prior levels, otherwise LFTs WNL - Potassium is down to 2.8,, magnesium  pending - Sodium is 139, chloride is 111, BUN is 10, anion gap 10  -Case discussed with on-call PCCM attending Dr. Rolan Sharps who recommended possible intubation--- further conversations with palliative care provider and HCPOA at bedside as well patient's HCPOA declines intubation, she requested DNR/DNI status but not comfort care yet... She wants to give time for outcome and make further decisions over the next 24 hours or so if patient does not improve.  Review of Systems: unable to review all systems due to the inability of the patient to answer questions. 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   Depression    Fibromyalgia    Hypertension    Neuropathy    Rheumatoid arthritis (HCC)    Stroke (HCC) 02/17/14   Past Surgical History:  Procedure Laterality Date   ABDOMINAL HYSTERECTOMY     ANKLE RECONSTRUCTION     APPENDECTOMY     BACK SURGERY     CHOLECYSTECTOMY     IR NEPHROSTOMY EXCHANGE LEFT  06/09/2024   IR NEPHROSTOMY PLACEMENT LEFT  04/22/2024   IR NEPHROSTOMY PLACEMENT RIGHT  04/01/2024   IR NEPHROSTOMY PLACEMENT RIGHT  06/09/2024   KNEE SURGERY     Social History:  reports that she has never smoked. She has never been exposed to tobacco smoke. She has never used smokeless tobacco. She reports that she does not drink alcohol  and does not use drugs.  Allergies[1]  Family History  Problem Relation Age of Onset   Breast cancer Mother  Hypertension Father    Transient ischemic attack Father    Lung cancer Father    Hypertension Brother    Leukemia Brother    Leukemia Brother    Leukemia Brother    CVA Maternal Grandfather     Prior to Admission medications  Medication Sig Start Date End Date Taking? Authorizing Provider  acetaminophen  (TYLENOL ) 500 MG tablet Take 1,000  mg by mouth 2 (two) times daily.    [provider]  Adalimumab (HUMIRA, 2 PEN,) 40 MG/0.8ML PNKT Inject 0.8 mLs into the skin every 14 (fourteen) days.    [provider]  ALPRAZolam  (XANAX ) 0.5 MG tablet Take 1 tablet (0.5 mg total) by mouth 2 (two) times daily as needed for anxiety. 07/12/24   Darci Pore, MD  Cranberry 450 MG TABS Take 1 tablet by mouth in the morning and at bedtime. For UTI    [provider]  cycloSPORINE  (RESTASIS ) 0.05 % ophthalmic emulsion Place 1 drop into both eyes 2 (two) times daily. Dry eyes    [provider]  estradiol  (ESTRACE ) 0.1 MG/GM vaginal cream Place 1 Applicatorful vaginally every other day. Discard plastic applicator. Insert a blueberry size amount (approximately 1 gram) of cream on fingertip inside vagina at bedtime every other night for long term use. 01/09/24   [name], [name], MD  ferrous sulfate  325 (65 FE) MG tablet Take 1 tablet (325 mg total) by mouth daily with breakfast. 07/12/24   Darci Pore, MD  fluticasone (FLONASE) 50 MCG/ACT nasal spray Place 1 spray into both nostrils 2 (two) times daily.    [provider]  folic acid  (FOLVITE ) 1 MG tablet Take 1 tablet (1 mg total) by mouth every morning. 07/12/24   Darci Pore, MD  gabapentin  (NEURONTIN ) 100 MG capsule Take 2 capsules (200 mg total) by mouth 3 (three) times daily as needed (neuropathy pain symptoms). 06/14/24   [name], MD  ipratropium-albuterol  (DUONEB) 0.5-2.5 (3) MG/3ML SOLN Take 3 mLs by nebulization every 6 (six) hours as needed (shortness of breath, wheezing). 05/10/24   [provider]  lansoprazole (PREVACID) 30 MG capsule Take 60 mg by mouth 2 (two) times daily before a meal. DO NOT CHANGE PER MD    [provider]  leflunomide  (ARAVA ) 10 MG tablet Take 10 mg by mouth every morning. 08/01/20   [provider]  methenamine  (HIPREX ) 1 g tablet Take 1 tablet (1 g total) by mouth 2  (two) times daily. Prophylactic 01/22/24   [name], [name], MD  metoprolol  tartrate (LOPRESSOR ) 25 MG tablet Take 0.5 tablets (12.5 mg total) by mouth 2 (two) times daily. 01/09/24   [name], MD  Multiple Vitamin (MULTIVITAMIN PO) Take 1 tablet by mouth 2 (two) times daily.    [provider]  Nutritional Supplements (,FEEDING SUPPLEMENT, PROSOURCE PLUS) liquid Take 30 mLs by mouth 2 (two) times daily between meals. 06/14/24   [name], MD  ondansetron  (ZOFRAN ) 4 MG tablet Take 4 mg by mouth every 6 (six) hours as needed for vomiting or nausea. 02/13/24   [provider]  Pollen Extracts (PROSTAT PO) Take 30 mLs by mouth daily.    [provider]  senna (SENOKOT) 8.6 MG tablet Take 1 tablet by mouth 2 (two) times daily.    [provider]  sodium bicarbonate  650 MG tablet Take 650 mg by mouth 3 (three) times daily. 0900, 1400 and 2100    [provider]  traMADol  (ULTRAM ) 50 MG  tablet Take 2 tablets (100 mg total) by mouth every 6 (six) hours as needed for severe pain (pain score 7-10). 06/14/24   [name], MD  traZODone  (DESYREL ) 100 MG tablet Take 1 tablet (100 mg total) by mouth at bedtime as needed for sleep. 07/12/24   Darci Pore, MD    Physical Exam: Vitals:   07/15/24 1615 07/15/24 1630 07/15/24 1645 07/15/24 1741  BP: (!) 118/55 (!) 101/59 92/60   Pulse: (!) 154 (!) 135 (!) 130   Resp: (!) 26 (!) 23 (!) 21   Temp:      TempSrc:      SpO2: 91% 93% 95%   Weight:      Height:    5' 6 (1.676 m)    Physical Exam Gen:-Lethargic and acutely ill-appearing, very pale HEENT:- [place].AT, No sclera icterus Neck-Supple Neck, right IJ central line Lungs-diminished breath sounds with scattered rhonchi and wheezes bilaterally CV- S1, S2 normal, RRR, tachycardic Abd-  +ve B.Sounds, Abd Soft, No tenderness,    Extremity/Skin:- +ve  edema,   good pedal pulses , cool Neuro-Psych--patient is very lethargic, limited  neuro and psych exam  Data Reviewed: --In the ED influenza A is positive (previously positive during last admission), RSV and COVID-negative -- Chest x-ray suspicious for right-sided pneumonia -- WBC 15.3 it was 4.6 on 07/11/2024,  Lactic acid 3.2 >>4.0 >>1.7 -Hgb 8.5 , recent baseline usually between 9 and 10 (did require PRBC transfusion in January 2026) - UA from nephrostomy tube suggestive of UTI --- recent urine culture from 07/07/2024 grew---->=100,000 COLONIES/mL ACINETOBACTER CALCOACETICUS/BAUMANNII COMPLEX >=100,000 COLONIES/mL KLEBSIELLA PNEUMONIAE ACINETOBACTER CALCOACETICUS/BAUMANNII COMPLEX MULTI-DRUG RESISTANT ORGANISM Calcium  is 6.1 but albumin  is less than 1.5 so corrected calcium  is probably normal --Creatinine is 1.33 which is actually slightly lower than recent levels which were between 1.4 and 1.6 -Alk phos is elevated but lower than prior levels, otherwise LFTs WNL - Potassium is down to 2.8,, magnesium  pending - Sodium is 139, chloride is 111, BUN is 10, anion gap 10  Assessment and Plan: 1)Severe sepsis with septic shock due to combination of UTI and pneumonia most likely due to Klebsiella pneumonia and ACINETOBACTER CALCOACETICUS/BAUMANNII COMPLEX--MULTI-DRUG RESISTANT ORGANISM----- --chest x-ray suggestive of pneumonia -UA suggestive of UTI --discussed with PCCM attending Dr. Rolan Sharps -- Also discussed with ID physician Dr. Luiz --Also discussed with palliative care provider and patient's HCPOA --- Continue IV Levophed  for pressure support -- Continue IV fluids - ID recommends  Merrem  + minocycline  to cover both  Klebsiella pneumonia and get the double coverage of the acinetobacter complex --Add Iv hydrocortisone  per The 2024 Society of Critical Care Medicine focused update recommendations for patients with septic shock requiring vasopressors  -Intravenous hydrocortisone  should be continued for 7 days or until ICU discharge, whichever comes first.  The  recommended regimen is hydrocortisone  200 mg per day, administered either as a continuous infusion or divided every 6 hours, with or without fludrocortisone 50 ?g enterally daily. --- WBC 15.3 it was 4.6 on 07/11/2024,  Lactic acid 3.2 >>4.0 >>1.7 -- Overall prognosis is poor  2)Social/Ethics--- Plan of care and overall poor prognosis discussed at conference with palliative care provider and patient's HCPOA---Mrs Chandler-Long, Jafarrell (HCPOA) at bedside  --Case discussed with on-call PCCM attending Dr. Rolan Sharps who recommended possible intubation--- further conversations with palliative care provider and HCPOA at bedside as well patient's HCPOA declines intubation, she requested DNR/DNI status but not comfort care yet... She wants to give time for outcome and make  further decisions over the next 24 hours or so if patient does not improve.  3)Recent influenza A--- supportive and symptomatic treatment -Chest x-ray with concerns for acute pneumonia -Suspect post influenza pneumonia -Antibiotics as above #1 -- Continue bronchodilators, mucolytics and Tamiflu    4)Presumed CAUTI--POA -patient has bilateral nephrostomy tubes  - UA from nephrostomy tube suggestive of UTI   - Fevers and leukocytosis as noted above in #1 - Antibiotics as noted above #1  5)HTN--hold metoprolol   until BP recovers   6) chronic anemia--multifactorial in the setting of CKD and iron deficiency --Hgb 8.5 , recent baseline usually between 9 and 10 (did require PRBC transfusion in January 2026) -Continue iron supplementation and folic acid    7)GERD--- use iv Protonix  for GI/stress ulcer prophylaxis especially in the setting of high-dose IV steroid use  8)Hypokalemia--- potassium is down to 2.8,, magnesium  pending -Replace and recheck electrolyte-   9)CKD stage - 3B  --Creatinine is 1.33 which is actually slightly lower than recent levels which were between 1.4 and 1.6 , renally adjust medications, avoid nephrotoxic  agents / dehydration  / hypotension   10)Presumed post influenza pneumonia/acute hypoxic respiratory failure --- -currently requiring 5 L of oxygen  via nasal cannula- -management as above  CRITICAL CARE Performed by: Rendall Carwin   Total critical care time: 71 minutes  Critical care time was exclusive of separately billable procedures and treating other patients. - Persistent hypotension despite IV fluids -Continue IV Levophed  for pressure support -- Continue IV fluids - ID recommends  Merrem  + minocycline  to cover both  Klebsiella pneumonia and get the double coverage of the acinetobacter complex --Add Iv hydrocortisone  Critical care was necessary to treat or prevent imminent or life-threatening deterioration.  Critical care was time spent personally by me on the following activities: development of treatment plan with patient and/or surrogate as well as nursing, discussions with consultants, evaluation of patient's response to treatment, examination of patient, obtaining history from patient or surrogate, ordering and performing treatments and interventions, ordering and review of laboratory studies, ordering and review of radiographic studies, pulse oximetry and re-evaluation of patient's condition.    Advance Care Planning:   Code Status: Limited: Do not attempt resuscitation (DNR) -DNR-LIMITED -Do Not Intubate/DNI    Consults: Palliative care/PCCM/infectious disease  Family Communication: patient's HCPOA---Mrs Chandler-Long, Jafarrell (HCPOA) at bedside   Severity of Illness: The appropriate patient status for this patient is INPATIENT. Inpatient status is judged to be reasonable and necessary in order to provide the required intensity of service to ensure the patient's safety. The patient's presenting symptoms, physical exam findings, and initial radiographic and laboratory data in the context of their chronic comorbidities is felt to place them at high risk for further  clinical deterioration. Furthermore, it is not anticipated that the patient will be medically stable for discharge from the hospital within 2 midnights of admission.   * I certify that at the point of admission it is my clinical judgment that the patient will require inpatient hospital care spanning beyond 2 midnights from the point of admission due to high intensity of service, high risk for further deterioration and high frequency of surveillance required.*  Author: Rendall Carwin, MD 07/15/2024 5:51 PM  For on call review [email or web address].      [1]  Allergies Allergen Reactions   Cortisone Anaphylaxis    Cardiovascular Arrest   Fentanyl  Anaphylaxis, Hives and Other (See Comments)    felt like I had demons in my head   Hydromorphone  Anaphylaxis and Nausea  And Vomiting    GI Intolerance   Iodinated Contrast Media Anaphylaxis, Hives, Rash and Dermatitis    Respiratory Distress  Iodinated contrast media (substance)   Keflex  [Cephalexin ] Nausea And Vomiting   Meperidine Anaphylaxis and Shortness Of Breath    Respiratory Distress   Morphine Anaphylaxis, Nausea And Vomiting, Swelling and Rash    GI Intolerance   Oxycodone -Acetaminophen  Anaphylaxis, Nausea And Vomiting, Swelling, Dermatitis and Rash    GI Intolerance, Mouth swelling.  acetaminophen  / oxycodone    Penicillins Anaphylaxis, Nausea And Vomiting and Other (See Comments)    Immediate rash, facial/tongue/throat swelling, SOB or lightheadedness with hypotension  Product containing penicillin (product)   Afluria Preservative Free [Influenza Virus Vacc Split Pf] Other (See Comments)    Unknown   Aspirin  Other (See Comments)    GI Intolerance   Codeine Other (See Comments)    Unknown    Influenza Virus Vaccine Other (See Comments)    Unknown    Iohexol  Other (See Comments)    Unknown reaction   Meperidine Hcl Other (See Comments)    Unknown   Oxycodone  Hcl Other (See Comments)    Unknown   Shellfish Allergy Other  (See Comments)    Glucosamine not an option Unknown    Sulfa  Antibiotics Nausea And Vomiting    Tolerates Bactrim  though   Troleandomycin Nausea And Vomiting and Swelling    GI Intolerance   Potassium Chloride  Other (See Comments)    No reaction listed on MAR   Bisphosphonates Hives and Other (See Comments)    GI intolerance   Ciprofloxacin  Nausea Only and Rash   Levaquin [Levofloxacin In D5w] Nausea And Vomiting and Rash    Mental Status Changes, Confusion   Oxytetracycline Nausea And Vomiting   Wheat Rash   "

## 2024-07-15 NOTE — Plan of Care (Signed)
   Problem: Coping: Goal: Level of anxiety will decrease Outcome: Progressing   Problem: Pain Managment: Goal: General experience of comfort will improve and/or be controlled Outcome: Progressing

## 2024-07-15 NOTE — Progress Notes (Signed)
 07/15/2024 Case d/w EDP. Here from SNF with septic shock in context of recent influenza infection probable bacterial superinfection. 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 On ~10 mcg levo, sats low 90s on 15LPM, RR on 30s Patient adamant on no ETT. D/w TRH, will consult remotely when patient in ICU bed. Will discuss empiric abx with ID given complicated micro hx. Would suggest ongoing GOC.  Rolan Sharps MD PCCM

## 2024-07-15 NOTE — ED Provider Notes (Signed)
 " [facility] [facility] Provider Note   CSN: [id number] Arrival date & time: 07/15/24  0707     Patient presents with: Code Sepsis   Robin Blackburn is a 79 y.o. female.   HPI 79 year old female presents from her facility with possible sepsis.  Was found to have O2 sats in the 70s, blood pressure in the 70s and heart rate 130s.  Has been put on 6 L of oxygen .  O2 sats now in the 90s.  The patient is relatively lethargic and unable to provide any significant history.  She had an recent admission and discharge for sepsis.  She has bilateral nephrostomy tubes and there appears to be blood in the left one which is new.  Prior to Admission medications  Medication Sig Start Date End Date Taking? Authorizing Provider  acetaminophen  (TYLENOL ) 500 MG tablet Take 1,000 mg by mouth 2 (two) times daily.    [provider]  Adalimumab (HUMIRA, 2 PEN,) 40 MG/0.8ML PNKT Inject 0.8 mLs into the skin every 14 (fourteen) days.    [provider]  ALPRAZolam  (XANAX ) 0.5 MG tablet Take 1 tablet (0.5 mg total) by mouth 2 (two) times daily as needed for anxiety. 07/12/24   Darci Pore, MD  Cranberry 450 MG TABS Take 1 tablet by mouth in the morning and at bedtime. For UTI    [provider]  cycloSPORINE  (RESTASIS ) 0.05 % ophthalmic emulsion Place 1 drop into both eyes 2 (two) times daily. Dry eyes    [provider]  estradiol  (ESTRACE ) 0.1 MG/GM vaginal cream Place 1 Applicatorful vaginally every other day. Discard plastic applicator. Insert a blueberry size amount (approximately 1 gram) of cream on fingertip inside vagina at bedtime every other night for long term use. 01/09/24   [name], MD  ferrous sulfate  325 (65 FE) MG tablet Take 1 tablet (325 mg total) by mouth daily with breakfast. 07/12/24   Darci Pore, MD  fluticasone (FLONASE) 50 MCG/ACT nasal spray Place 1 spray into both nostrils 2 (two) times daily.     [provider]  folic acid  (FOLVITE ) 1 MG tablet Take 1 tablet (1 mg total) by mouth every morning. 07/12/24   Darci Pore, MD  gabapentin  (NEURONTIN ) 100 MG capsule Take 2 capsules (200 mg total) by mouth 3 (three) times daily as needed (neuropathy pain symptoms). 06/14/24   [name], MD  ipratropium-albuterol  (DUONEB) 0.5-2.5 (3) MG/3ML SOLN Take 3 mLs by nebulization every 6 (six) hours as needed (shortness of breath, wheezing). 05/10/24   [provider]  lansoprazole (PREVACID) 30 MG capsule Take 60 mg by mouth 2 (two) times daily before a meal. DO NOT CHANGE PER MD    [provider]  leflunomide  (ARAVA ) 10 MG tablet Take 10 mg by mouth every morning. 08/01/20   [provider]  methenamine  (HIPREX ) 1 g tablet Take 1 tablet (1 g total) by mouth 2 (two) times daily. Prophylactic 01/22/24   [name], [name], MD  metoprolol  tartrate (LOPRESSOR ) 25 MG tablet Take 0.5 tablets (12.5 mg total) by mouth 2 (two) times daily. 01/09/24   [name], MD  Multiple Vitamin (MULTIVITAMIN PO) Take 1 tablet by mouth 2 (two) times daily.    [provider]  Nutritional Supplements (,FEEDING SUPPLEMENT, PROSOURCE PLUS) liquid Take 30 mLs by mouth 2 (two) times daily between meals. 06/14/24   [name], MD  ondansetron  (ZOFRAN ) 4 MG tablet Take 4 mg by  mouth every 6 (six) hours as needed for vomiting or nausea. 02/13/24   [provider]  Pollen Extracts (PROSTAT PO) Take 30 mLs by mouth daily.    [provider]  senna (SENOKOT) 8.6 MG tablet Take 1 tablet by mouth 2 (two) times daily.    [provider]  sodium bicarbonate  650 MG tablet Take 650 mg by mouth 3 (three) times daily. 0900, 1400 and 2100    [provider]  traMADol  (ULTRAM ) 50 MG tablet Take 2 tablets (100 mg total) by mouth every 6 (six) hours as needed for severe pain (pain score 7-10). 06/14/24   [name], MD  traZODone  (DESYREL )  100 MG tablet Take 1 tablet (100 mg total) by mouth at bedtime as needed for sleep. 07/12/24   Darci Pore, MD    Allergies: Cortisone, Fentanyl , Hydromorphone , Iodinated contrast media, Keflex  [cephalexin ], Meperidine, Morphine, Oxycodone -acetaminophen , Penicillins, Afluria preservative free [influenza virus vacc split pf], Aspirin , Codeine, Influenza virus vaccine, Iohexol , Meperidine hcl, Oxycodone  hcl, Shellfish allergy, Sulfa  antibiotics, Troleandomycin, Potassium chloride , Bisphosphonates, Ciprofloxacin , Levaquin [levofloxacin in d5w], Oxytetracycline, and Wheat    Review of Systems  Unable to perform ROS: Mental status change    Updated Vital Signs BP (!) 91/54   Pulse (!) 130   Temp 99.9 F (37.7 C) (Oral)   Resp (!) 37   Ht 5' 6 (1.676 m)   Wt 68 kg   SpO2 91%   BMI 24.20 kg/m   Physical Exam Vitals and nursing note reviewed.  Constitutional:      Appearance: She is well-developed. She is ill-appearing.  HENT:     Head: Normocephalic and atraumatic.  Cardiovascular:     Rate and Rhythm: Regular rhythm. Tachycardia present.     Heart sounds: Normal heart sounds.  Pulmonary:     Effort: Tachypnea present. No accessory muscle usage.     Breath sounds: No wheezing or rales.  Abdominal:     General: There is no distension.     Palpations: Abdomen is soft.     Tenderness: There is no abdominal tenderness.  Musculoskeletal:     Comments: Right foot is in a soft boot for pressure.  There is no obvious wound to her foot. Right upper extremity is generally edematous without erythema  Skin:    General: Skin is warm and dry.  Neurological:     Mental Status: She is lethargic.     Comments: Patient tries to turn to look at me when I call her name.  Her eyelids are mildly droopy bilaterally.  She does seem to at least try to look at me but does not talk much.     (all labs ordered are listed, but only abnormal results are displayed) Labs Reviewed  RESP PANEL BY  RT-PCR (RSV, FLU A&B, COVID)  RVPGX2 - Abnormal; Notable for the following components:      Result Value   Influenza A by PCR POSITIVE (*)    All other components within normal limits  LACTIC ACID, PLASMA - Abnormal; Notable for the following components:   Lactic Acid, Venous 3.2 (*)    All other components within normal limits  LACTIC ACID, PLASMA - Abnormal; Notable for the following components:   Lactic Acid, Venous 4.0 (*)    All other components within normal limits  CBC WITH DIFFERENTIAL/PLATELET - Abnormal; Notable for the following components:   WBC 15.3 (*)    RBC 2.74 (*)    Hemoglobin 8.5 (*)  HCT 26.9 (*)    RDW 16.7 (*)    Neutro Abs 12.9 (*)    Abs Immature Granulocytes 0.15 (*)    All other components within normal limits  PROTIME-INR - Abnormal; Notable for the following components:   Prothrombin Time 35.8 (*)    INR 3.4 (*)    All other components within normal limits  URINALYSIS, W/ REFLEX TO CULTURE (INFECTION SUSPECTED) - Abnormal; Notable for the following components:   APPearance TURBID (*)    Hgb urine dipstick LARGE (*)    Protein, ur 100 (*)    Leukocytes,Ua LARGE (*)    Bacteria, UA MANY (*)    All other components within normal limits  CBG MONITORING, ED - Abnormal; Notable for the following components:   Glucose-Capillary 125 (*)    All other components within normal limits  CULTURE, BLOOD (ROUTINE X 2)  CULTURE, BLOOD (ROUTINE X 2)  URINE CULTURE  COMPREHENSIVE METABOLIC PANEL WITH GFR  LACTIC ACID, PLASMA  LACTIC ACID, PLASMA  COMPREHENSIVE METABOLIC PANEL WITH GFR    EKG: EKG Interpretation Date/Time:  Friday July 15 2024 07:21:56 EST Ventricular Rate:  131 PR Interval:  90 QRS Duration:  83 QT Interval:  355 QTC Calculation: 525 R Axis:   13  Text Interpretation: Sinus tachycardia Low voltage, precordial leads Repolarization abnormality, prob rate related Prolonged QT interval Confirmed by Freddi Hamilton [phone]) on 07/15/2024  8:02:57 AM  Radiology: [facility] Chest Portable 1 View Result Date: 07/15/2024 EXAM: 1 VIEW(S) XRAY OF THE CHEST 07/15/2024 11:07:00 AM COMPARISON: 07/15/2024 CLINICAL HISTORY: 79 year old female. Suspected sepsis. Verify central line placement. FINDINGS: LINES, TUBES AND DEVICES: Right internal jugular central venous catheter in place with tip overlying the superior caval junction. LUNGS AND PLEURA: Right mid lung masslike opacity, stable. Increased left retrocardiac opacity. Possible small left pleural effusion. No pneumothorax. HEART AND MEDIASTINUM: No acute abnormality of the cardiac and mediastinal silhouettes. BONES AND SOFT TISSUES: No acute osseous abnormality. IMPRESSION: 1. Right internal jugular central venous catheter in place with tip overlying the superior cavoatrial junction. 2. No pneumothorax. 3. Stable right mid lung mass-like opacity. Small left pleural effusion and increased left lung base opacity. Electronically signed by: Helayne Hurst MD 07/15/2024 11:27 AM EST RP Workstation: [id number]   DG Chest Port 1 View if patient is in a treatment room. Result Date: 07/15/2024 EXAM: 1 VIEW(S) XRAY OF THE CHEST 07/15/2024 07:46:00 AM COMPARISON: 07/07/2024 CLINICAL HISTORY: Suspected sepsis. FINDINGS: LUNGS AND PLEURA: Nodular opacities in the right upper and midlungs, new since 07/07/2024 with associated hazy opacity in the right midlung, favored to reflect infection. Follow up to resolution is recommended. No pleural effusion. No pneumothorax. HEART AND MEDIASTINUM: Aortic atherosclerotic calcifications. No acute abnormality of the cardiac and mediastinal silhouettes. BONES AND SOFT TISSUES: No acute osseous abnormality. IMPRESSION: 1. Nodular opacities in the right upper and midlung with associated hazy opacity in the right midlung, favored to reflect infection; follow-up to resolution is recommended. 2. Aortic Atherosclerosis (ICD10-I70.0). Electronically signed by: Ryan Chess MD 07/15/2024 08:22  AM EST RP Workstation: [id number]     .Ultrasound ED Peripheral IV (Provider)  Date/Time: 07/15/2024 7:57 AM  Performed by: Freddi Hamilton, MD Authorized by: Freddi Hamilton, MD   Procedure details:    Indications: multiple failed IV attempts and poor IV access     Skin Prep: chlorhexidine  gluconate     Location:  Left AC   Angiocath:  20 G   Bedside Ultrasound Guided: Yes     Patient  tolerated procedure without complications: Yes     Dressing applied: Yes   Central Line  Performed by: Freddi Hamilton, MD Authorized by: Freddi Hamilton, MD   Consent:    Consent obtained:  Verbal   Consent given by:  Patient and healthcare agent   Risks, benefits, and alternatives were discussed: yes   Universal protocol:    Patient identity confirmed:  Verbally with patient Pre-procedure details:    Indication(s): central venous access and insufficient peripheral access     Hand hygiene: Hand hygiene performed prior to insertion     Sterile barrier technique: All elements of maximal sterile technique followed     Skin preparation:  Chlorhexidine    Skin preparation agent: Skin preparation agent completely dried prior to procedure   Anesthesia:    Anesthesia method:  Local infiltration   Local anesthetic:  5 mL lidocaine  (PF) 1 % Procedure details:    Location:  R internal jugular   Patient position:  Trendelenburg   Procedural supplies:  Triple lumen   Landmarks identified: yes     Ultrasound guidance: yes     Ultrasound guidance timing: prior to insertion and real time     Sterile ultrasound techniques: Sterile gel and sterile probe covers were used     Number of attempts:  1   Successful placement: yes   Post-procedure details:    Post-procedure:  Dressing applied and line sutured   Assessment:  Blood return through all ports, free fluid flow, no pneumothorax on x-ray and placement verified by x-ray   Complications:  Mild bleeding/oozing from insertion site .Critical  Care  Performed by: Freddi Hamilton, MD Authorized by: Freddi Hamilton, MD   Critical care provider statement:    Critical care time (minutes):  75   Critical care time was exclusive of:  Separately billable procedures and treating other patients   Critical care was necessary to treat or prevent imminent or life-threatening deterioration of the following conditions:  Sepsis and shock   Critical care was time spent personally by me on the following activities:  Development of treatment plan with patient or surrogate, discussions with consultants, evaluation of patient's response to treatment, examination of patient, ordering and review of laboratory studies, ordering and review of radiographic studies, ordering and performing treatments and interventions, pulse oximetry, re-evaluation of patient's condition and review of old charts    Medications Ordered in the ED  lactated ringers  infusion (has no administration in time range)  vancomycin  (VANCOREADY) IVPB 1500 mg/300 mL (1,500 mg Intravenous New Bag/Given 07/15/24 1007)  norepinephrine  (LEVOPHED ) 4mg  in (0.016 mg/mL) premix infusion (has no administration in time range)  sodium chloride  0.9 % bolus 1,000 mL (0 mLs Intravenous Stopped 07/15/24 0835)    And  sodium chloride  0.9 % bolus 1,000 mL (0 mLs Intravenous Stopped 07/15/24 1109)    And  sodium chloride  0.9 % bolus 250 mL (0 mLs Intravenous Stopped 07/15/24 1030)  ceFEPIme  (MAXIPIME ) 2 g in sodium chloride  0.9 % 100 mL IVPB (0 g Intravenous Stopped 07/15/24 0911)  metroNIDAZOLE  (FLAGYL ) IVPB 500 mg (0 mg Intravenous Stopped 07/15/24 0958)  acetaminophen  (TYLENOL ) suppository 650 mg (650 mg Rectal Given 07/15/24 0732)  lidocaine -EPINEPHrine  (XYLOCAINE  W/EPI) 2 %-1:200000 (PF) injection 10 mL (10 mLs Infiltration Given 07/15/24 1029)                                    Medical Decision Making Amount  and/or Complexity of Data Reviewed Independent Historian: guardian External Data Reviewed:  notes. Labs: ordered.    Details: Elevated lactic acid Radiology: ordered and independent interpretation performed.    Details: Likely pneumonia ECG/medicine tests: ordered and independent interpretation performed.    Details: Sinus tachycardia  Risk OTC drugs. Prescription drug management. Decision regarding hospitalization.   Patient presents with septic shock symptoms.  She is quite hypotensive.  Has poor IV access but I was able to place a 20-gauge IV in combination with her 22 in her arm.  I had multiple discussions with the POA, Jafarrell Chandler-Long.  She endorses the patient is definitely DNR/DNI.  There was some delay in regards to whether the patient would want pressors and/or central line.  However patient is becoming more alert and does agree to central line and POA wants this as well.  She is requiring pressors given persistent hypotension.  Central line placed as above though there has been some oozing after the procedure.  However pressure was able to control bleeding.  She is now on norepinephrine .  Consulted hospitalist and ICU.  I have talked to the patient again and she is definitively DNR/DNI according to patient and POA and so I do not know that going to [facility] will change much management.  Dr. Pearlean agrees to AP she is currently requiring high flow nasal cannula with increased work of breathing but sats are in the 90s.     Final diagnoses:  Septic shock [facility])    ED Discharge Orders     None          Freddi Hamilton, MD 07/15/24 1517  "

## 2024-07-15 NOTE — Sepsis Progress Note (Signed)
 eLink is following this Code Sepsis.

## 2024-07-15 NOTE — Consult Note (Incomplete)
 "                              Consultation Note Date: 07/15/2024   Patient Name: Robin Blackburn  DOB: 1945/12/13  MRN: [id number]  Age / Sex: 79 y.o., female  PCP: Randy Eva, NP Referring Physician: Pearlean Manus, MD  Reason for Consultation: Establishing goals of care    HPI/Patient Profile: 79 y.o. 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   Readmitted to the hospital with possible sepsis.  O2 sats in the 70s.  Hypotensive and tachycardic.  Noted to be lethargic.  Workup reveals significant hypokalemia.  Calcium  level critically low at 6.1.  Renal function elevated although in line with her CKD diagnosis.  Albumin  less than 1.5 and total protein 3.9 consistent with poor prognosis and higher overall disease burden.  Lactic acid elevated .  White blood cell count elevated.  Hemoglobin slightly decreased from previous at 8.5 on discharge 4 days ago was 9.1.  Noted to have  PMT has been consulted to assist with goals of care conversation.  Clinical Assessment and Goals of Care:  I have reviewed medical records including EPIC notes, labs and imaging, ***, assessed the patient and then *** with *** to discuss diagnosis prognosis, GOC, EOL wishes, disposition and options. Collaborated directly with ***.   I introduced Palliative Medicine as specialized medical care for people living with serious illness. It focuses on providing relief from the symptoms and stress of a serious illness. The goal is to improve quality of life for both the patient and the family.  We discussed a brief life review of the patient and then focused on their current illness. ***  I attempted to elicit values and goals of care important to the patient.    Medical History Review and Family/Patient  Understanding:   ***  Social History:  ***  Functional and Nutritional State:  ***  Palliative Symptoms:  ***  Advance Directives/Goals of Care/Anticipatory care planning Discussion:  A detailed discussion regarding GOC, advanced directives, and anticipatory care planning was had. ***    The difference between aggressive medical intervention and comfort care was considered in light of the patient's goals of care. Hospice and Palliative Care services outpatient were explained and offered.   Discussed the importance of continued conversation with family and the medical providers regarding overall plan of care and treatment options, ensuring decisions are within the context of the patients values and GOCs.   Questions and concerns were addressed.  Hard Choices booklet left for review. The family was encouraged to call with questions or concerns.  PMT will continue to support holistically.  Primary Decision maker and health care surrogate:  {Primary Decision Fjxzm:78612}  Code Status:  ***    SUMMARY OF RECOMMENDATIONS          Code Status/Advance Care Planning: {Palliative Code status:23503}   Symptom Management:  ***  Palliative Prophylaxis:  {Palliative Prophylaxis:21015}  Additional Recommendations (Limitations, Scope, Preferences): {Recommended Scope and Preferences:21019}  Psycho-social/Spiritual:  Desire for further Chaplaincy support:{YES NO:22349} Additional Recommendations: {PAL SOCIAL:21064}  Prognosis:  {Palliative Care Prognosis:23504}  Discharge Planning: {Palliative dispostion:23505}      Primary Diagnoses: Present on Admission:  Acute respiratory failure with hypoxia (HCC)  Severe sepsis (HCC)  Physical Exam  Vital Signs: BP 94/62   Pulse (!) 131   Temp 99.9 F (37.7 C) (Oral)   Resp (!) 23   Ht 5' 6 (1.676 m)   Wt 68 kg   SpO2 92%   BMI 24.20 kg/m  Pain Scale: Not given for pain       SpO2: SpO2: 92 % O2  Device:SpO2: 92 % O2 Flow Rate: .O2 Flow Rate (L/min): 15 L/min   Palliative Assessment/Data:    Total time:  I personally spent a total of *** minutes in the care of the patient today including {Time Based Coding:210964241}.   Billing based on MDM: ***  {Problems Addressed:304933}  {Amount and/or Complexity of Ijuj:695065}  {Risks:304936}   Laymon CHRISTELLA Pinal, NP  Palliative Medicine Team Team phone # [phone]  Thank you for allowing the Palliative Medicine Team to assist in the care of this patient. Please utilize secure chat with additional questions, if there is no response within 30 minutes please call the above phone number.  Palliative Medicine Team providers are available by phone from 7am to 7pm daily and can be reached through the team cell phone.  Should this patient require assistance outside of these hours, please call the patient's attending physician.    "

## 2024-07-15 NOTE — ED Triage Notes (Signed)
 Pt bib by [facility] from [facility] for code sepsis. Pt found to have o2 sats in 70s, a bp of 74/45, HR 130s.  Pt put on 6 L and came up to 91%.

## 2024-07-15 NOTE — Sepsis Progress Note (Signed)
 Confirmed with the bedside RN Evern Blush that one blood culture was drawn prior to giving the antibiotic.

## 2024-08-25 ENCOUNTER — Ambulatory Visit: Admitting: Registered Nurse
